# Patient Record
Sex: Male | Born: 1935
Health system: Southern US, Community
[De-identification: ages and names within clinical notes are randomized; demographics above are authoritative.]

## PROBLEM LIST (undated history)

## (undated) DIAGNOSIS — J439 Emphysema, unspecified: Secondary | ICD-10-CM

## (undated) DIAGNOSIS — N259 Disorder resulting from impaired renal tubular function, unspecified: Secondary | ICD-10-CM

## (undated) DIAGNOSIS — K649 Unspecified hemorrhoids: Secondary | ICD-10-CM

## (undated) DIAGNOSIS — J449 Chronic obstructive pulmonary disease, unspecified: Secondary | ICD-10-CM

## (undated) DIAGNOSIS — H3530 Unspecified macular degeneration: Secondary | ICD-10-CM

## (undated) DIAGNOSIS — I255 Ischemic cardiomyopathy: Secondary | ICD-10-CM

## (undated) DIAGNOSIS — I509 Heart failure, unspecified: Secondary | ICD-10-CM

## (undated) DIAGNOSIS — I252 Old myocardial infarction: Secondary | ICD-10-CM

## (undated) DIAGNOSIS — E785 Hyperlipidemia, unspecified: Secondary | ICD-10-CM

## (undated) DIAGNOSIS — Z95810 Presence of automatic (implantable) cardiac defibrillator: Secondary | ICD-10-CM

## (undated) DIAGNOSIS — K5730 Diverticulosis of large intestine without perforation or abscess without bleeding: Secondary | ICD-10-CM

## (undated) DIAGNOSIS — H269 Unspecified cataract: Secondary | ICD-10-CM

## (undated) DIAGNOSIS — J4489 Other specified chronic obstructive pulmonary disease: Secondary | ICD-10-CM

## (undated) DIAGNOSIS — I2510 Atherosclerotic heart disease of native coronary artery without angina pectoris: Secondary | ICD-10-CM

## (undated) DIAGNOSIS — N400 Enlarged prostate without lower urinary tract symptoms: Secondary | ICD-10-CM

## (undated) DIAGNOSIS — R339 Retention of urine, unspecified: Secondary | ICD-10-CM

## (undated) DIAGNOSIS — D649 Anemia, unspecified: Secondary | ICD-10-CM

## (undated) HISTORY — DX: Retention of urine, unspecified: R33.9

## (undated) HISTORY — PX: TONSILLECTOMY: SUR1361

## (undated) HISTORY — DX: Ischemic cardiomyopathy: I25.5

## (undated) HISTORY — DX: Benign prostatic hyperplasia without lower urinary tract symptoms: N40.0

## (undated) HISTORY — DX: Anemia, unspecified: D64.9

## (undated) HISTORY — DX: Diverticulosis of large intestine without perforation or abscess without bleeding: K57.30

## (undated) HISTORY — DX: Heart failure, unspecified: I50.9

## (undated) HISTORY — DX: Hyperlipidemia, unspecified: E78.5

## (undated) HISTORY — DX: Atherosclerotic heart disease of native coronary artery without angina pectoris: I25.10

## (undated) HISTORY — DX: Chronic obstructive pulmonary disease, unspecified: J44.9

## (undated) HISTORY — DX: Emphysema, unspecified: J43.9

## (undated) HISTORY — PX: OTHER SURGICAL HISTORY: SHX169

## (undated) HISTORY — PX: CATARACT EXTRACTION: SUR2

## (undated) HISTORY — DX: Other specified chronic obstructive pulmonary disease: J44.89

## (undated) HISTORY — DX: Disorder resulting from impaired renal tubular function, unspecified: N25.9

## (undated) HISTORY — DX: Presence of automatic (implantable) cardiac defibrillator: Z95.810

---

## 2000-05-07 ENCOUNTER — Encounter (INDEPENDENT_AMBULATORY_CARE_PROVIDER_SITE_OTHER): Payer: Self-pay | Admitting: Specialist

## 2000-05-07 ENCOUNTER — Other Ambulatory Visit: Admission: RE | Admit: 2000-05-07 | Discharge: 2000-05-07 | Payer: Self-pay | Admitting: Urology

## 2000-08-19 ENCOUNTER — Encounter: Admission: RE | Admit: 2000-08-19 | Discharge: 2000-08-19 | Payer: Self-pay | Admitting: Urology

## 2000-08-19 ENCOUNTER — Encounter: Payer: Self-pay | Admitting: Urology

## 2005-06-02 ENCOUNTER — Encounter: Admission: RE | Admit: 2005-06-02 | Discharge: 2005-06-02 | Payer: Self-pay | Admitting: Gastroenterology

## 2009-01-27 HISTORY — PX: PACEMAKER INSERTION: SHX728

## 2009-01-27 HISTORY — PX: CARDIAC DEFIBRILLATOR PLACEMENT: SHX171

## 2009-04-17 ENCOUNTER — Ambulatory Visit: Payer: Self-pay | Admitting: Internal Medicine

## 2009-04-17 ENCOUNTER — Inpatient Hospital Stay (HOSPITAL_COMMUNITY): Admission: EM | Admit: 2009-04-17 | Discharge: 2009-05-07 | Payer: Self-pay | Admitting: Emergency Medicine

## 2009-04-17 ENCOUNTER — Ambulatory Visit: Payer: Self-pay | Admitting: Cardiovascular Disease

## 2009-04-22 ENCOUNTER — Encounter: Payer: Self-pay | Admitting: Internal Medicine

## 2009-04-25 ENCOUNTER — Ambulatory Visit: Payer: Self-pay | Admitting: Physical Medicine & Rehabilitation

## 2009-05-07 ENCOUNTER — Inpatient Hospital Stay (HOSPITAL_COMMUNITY)
Admission: RE | Admit: 2009-05-07 | Discharge: 2009-05-14 | Payer: Self-pay | Admitting: Physical Medicine & Rehabilitation

## 2009-05-07 ENCOUNTER — Encounter: Payer: Self-pay | Admitting: Cardiovascular Disease

## 2009-05-10 ENCOUNTER — Encounter: Payer: Self-pay | Admitting: Cardiovascular Disease

## 2009-05-11 ENCOUNTER — Ambulatory Visit: Payer: Self-pay | Admitting: Physical Medicine & Rehabilitation

## 2009-05-22 ENCOUNTER — Telehealth: Payer: Self-pay | Admitting: Cardiovascular Disease

## 2009-06-06 DIAGNOSIS — I2510 Atherosclerotic heart disease of native coronary artery without angina pectoris: Secondary | ICD-10-CM | POA: Insufficient documentation

## 2009-06-06 DIAGNOSIS — K5730 Diverticulosis of large intestine without perforation or abscess without bleeding: Secondary | ICD-10-CM | POA: Insufficient documentation

## 2009-06-06 DIAGNOSIS — D649 Anemia, unspecified: Secondary | ICD-10-CM

## 2009-06-06 DIAGNOSIS — R339 Retention of urine, unspecified: Secondary | ICD-10-CM

## 2009-06-06 DIAGNOSIS — E785 Hyperlipidemia, unspecified: Secondary | ICD-10-CM | POA: Insufficient documentation

## 2009-06-06 DIAGNOSIS — Z87898 Personal history of other specified conditions: Secondary | ICD-10-CM

## 2009-06-06 DIAGNOSIS — J45909 Unspecified asthma, uncomplicated: Secondary | ICD-10-CM

## 2009-06-06 DIAGNOSIS — E119 Type 2 diabetes mellitus without complications: Secondary | ICD-10-CM | POA: Insufficient documentation

## 2009-06-06 DIAGNOSIS — N183 Chronic kidney disease, stage 3 (moderate): Secondary | ICD-10-CM

## 2009-06-07 ENCOUNTER — Ambulatory Visit: Payer: Self-pay | Admitting: Cardiovascular Disease

## 2009-06-07 DIAGNOSIS — I219 Acute myocardial infarction, unspecified: Secondary | ICD-10-CM | POA: Insufficient documentation

## 2009-06-22 ENCOUNTER — Ambulatory Visit: Payer: Self-pay | Admitting: Cardiovascular Disease

## 2009-06-22 ENCOUNTER — Ambulatory Visit (HOSPITAL_COMMUNITY): Admission: RE | Admit: 2009-06-22 | Discharge: 2009-06-22 | Payer: Self-pay | Admitting: Cardiovascular Disease

## 2009-06-22 ENCOUNTER — Encounter: Payer: Self-pay | Admitting: Cardiovascular Disease

## 2009-07-10 ENCOUNTER — Telehealth: Payer: Self-pay | Admitting: Cardiovascular Disease

## 2009-08-01 ENCOUNTER — Encounter: Payer: Self-pay | Admitting: Cardiovascular Disease

## 2009-08-07 ENCOUNTER — Ambulatory Visit: Payer: Self-pay | Admitting: Cardiovascular Disease

## 2009-08-07 ENCOUNTER — Telehealth: Payer: Self-pay | Admitting: Cardiovascular Disease

## 2009-08-07 DIAGNOSIS — M25569 Pain in unspecified knee: Secondary | ICD-10-CM

## 2009-08-08 ENCOUNTER — Encounter (INDEPENDENT_AMBULATORY_CARE_PROVIDER_SITE_OTHER): Payer: Self-pay | Admitting: *Deleted

## 2009-08-13 ENCOUNTER — Ambulatory Visit: Payer: Self-pay | Admitting: Cardiovascular Disease

## 2009-08-13 ENCOUNTER — Ambulatory Visit (HOSPITAL_COMMUNITY): Admission: RE | Admit: 2009-08-13 | Discharge: 2009-08-13 | Payer: Self-pay | Admitting: Cardiovascular Disease

## 2009-08-15 ENCOUNTER — Telehealth: Payer: Self-pay | Admitting: Cardiovascular Disease

## 2009-08-29 ENCOUNTER — Encounter: Payer: Self-pay | Admitting: Cardiovascular Disease

## 2009-10-10 ENCOUNTER — Ambulatory Visit: Payer: Self-pay | Admitting: Internal Medicine

## 2009-10-24 ENCOUNTER — Ambulatory Visit: Payer: Self-pay | Admitting: Cardiovascular Disease

## 2009-10-24 DIAGNOSIS — H902 Conductive hearing loss, unspecified: Secondary | ICD-10-CM

## 2009-10-31 ENCOUNTER — Telehealth: Payer: Self-pay | Admitting: Internal Medicine

## 2009-12-03 ENCOUNTER — Ambulatory Visit: Payer: Self-pay | Admitting: Internal Medicine

## 2009-12-03 LAB — CONVERTED CEMR LAB
GFR calc non Af Amer: 56.6 mL/min (ref >60)
Glucose, Bld: 108 mg/dL — ABNORMAL HIGH (ref 70–99)
Sodium: 144 meq/L (ref 135–145)

## 2009-12-06 DIAGNOSIS — J209 Acute bronchitis, unspecified: Secondary | ICD-10-CM

## 2009-12-13 ENCOUNTER — Encounter: Payer: Self-pay | Admitting: Cardiovascular Disease

## 2009-12-13 ENCOUNTER — Ambulatory Visit: Payer: Self-pay | Admitting: Internal Medicine

## 2009-12-13 ENCOUNTER — Ambulatory Visit (HOSPITAL_COMMUNITY)
Admission: RE | Admit: 2009-12-13 | Discharge: 2009-12-14 | Payer: Self-pay | Source: Home / Self Care | Admitting: Internal Medicine

## 2009-12-15 LAB — CONVERTED CEMR LAB
Eosinophils Relative: 3.8 % (ref 0.0–5.0)
Lymphocytes Relative: 14.5 % (ref 12.0–46.0)
Lymphs Abs: 0.9 10*3/uL (ref 0.7–4.0)
MCHC: 33.2 g/dL (ref 30.0–36.0)
Monocytes Absolute: 0.4 10*3/uL (ref 0.1–1.0)
Monocytes Relative: 6.5 % (ref 3.0–12.0)
Neutro Abs: 4.8 10*3/uL (ref 1.4–7.7)
Neutrophils Relative %: 74.9 % (ref 43.0–77.0)
Platelets: 112 10*3/uL — ABNORMAL LOW (ref 150.0–400.0)
RBC: 4.32 M/uL (ref 4.22–5.81)
RDW: 17.8 % — ABNORMAL HIGH (ref 11.5–14.6)

## 2009-12-17 ENCOUNTER — Encounter: Payer: Self-pay | Admitting: Internal Medicine

## 2010-01-10 ENCOUNTER — Encounter: Payer: Self-pay | Admitting: Internal Medicine

## 2010-01-10 ENCOUNTER — Ambulatory Visit: Payer: Self-pay | Admitting: Cardiovascular Disease

## 2010-01-10 ENCOUNTER — Ambulatory Visit: Payer: Self-pay

## 2010-02-15 NOTE — Discharge Summary (Signed)
NAME:  [name] [name], [name] [name]             ACCOUNT NO.:  [id number]  MEDICAL RECORD NO.:  [id number]           PATIENT TYPE:  LOCATION:                                 FACILITY:  PHYSICIAN:  Noralyn Pick. Eden Emms, MD, FACCDATE OF BIRTH:  1926-01-10  DATE OF ADMISSION:  04/18/2009 DATE OF DISCHARGE:  05/07/2009                              DISCHARGE SUMMARY   PRIMARY CARDIOLOGIST:  Theron Arista C. Eden Emms, MD, [facility]  PRIMARY CARE PHYSICIAN:  [name], MD  DISCHARGE DIAGNOSES: 1. Presumed ventricular tachycardia/ventricular fibrillation arrest.     a.     Bystander CPR, 2 shocks from emergency medical services,      normal sinus rhythm but unresponsive at [facility] emergency department,      status post cooling protocol. 2. Non-ST segment elevation myocardial infarction.     a.     Cardiac catheterization, April 30, 2009:  Moderate diffuse      left anterior descending stenosis, severe stenosis of the distal      left circumflex and ramus intermedius, severe stenosis of the      proximal right coronary artery, moderate diffuse stenosis in the      mid right coronary artery.     b.     Cardiac catheterization and successful percutaneous coronary      intervention with drug-eluting stents, May 02, 2009:  Successful      percutaneous intervention of the ramus intermedius, distal left      circumflex, and right coronary artery (proximal and mid segments      using drug-eluting stent platforms. 3. Anemia (stable on discharge). 4. Deconditioning.     a.     Discharge to inpatient rehab. 5. Klebsiella pneumonia. 6. Urinary tract infection (Enterococcus). 7. Thrombocytopenia (heparin-induced thrombocytopenia panel negative).  SECONDARY DIAGNOSES: 1. Hyperlipidemia. 2. Chronic obstructive pulmonary disease, (home O2 at night). 3. Benign prostatic hypertrophy. 4. Tobacco abuse.  ALLERGIES:  NKDA.  PROCEDURES: 1. EKG, May 10, 2009, at 2033:  NSR, 80 bpm, RBBB, T-wave inversion     in V1 with 3 mm of  ST depression in V2 and 2 mm of ST depression in     V3, no other significant ST changes, normal axis, PR 202, QRS 116,     QTc 465. 2. Chest x-ray, 2009-05-10:  Minimal basilar atelectasis.     Satisfactory endotracheal tube position.  Probable fractures,     lateral left fifth and sixth ribs. 3. The CT of the head, 2009-05-10:  Generalized atrophy.  No acute     intracranial abnormalities.  Left frontal scalp hematoma. 4. CT of the cervical spine, 05/10/2009:  Pronounced multilevel     degenerative disk and facet disease changes of cervical spine.  No     acute cervical spine abnormalities. 5. A 2-D echocardiogram, April 22, 2009:  Poor acoustic windows, LV     systolic function appears normal.  Cannot evaluate wall motion     likely grade 1 diastolic dysfunction. 6. Chest x-ray, April 25, 2009:  Interval extubation.  COPD/chronic     interstitial thickening without superimposed acute process. 7.  Cardiac catheterization, April 30, 2009:  Please see discharge     diagnoses section under non-ST-elevation myocardial infarction. 8. Cardiac catheterizations/percutaneous intervention, May 03, 2009:     Please see discharge diagnoses section under non-ST-elevation     myocardial infarction. 9. EKG, May 03, 2009:  NSR, nonspecific ST-T wave abnormality, normal     axis without evidence of hypertrophy, no significant Q-waves, PR     164, QRS 92, and QTc of 434.  HISTORY OF PRESENT ILLNESS:  The patient was in his usual state of health until 2 weeks prior to his presentation when he started noting right shoulder discomfort.  On date of presentation, he came home from work and was walking upstairs and complained to his family of significant worsening of his right shoulder pain with radiation to his left lower chest.  The patient then found down by his daughter who performed CPR for approximately 1 minute prior to the arrival of EMS. Rhythm is unknown, but the patient did receive 2  shocks as well as epinephrine and atropine with return of normal sinus rhythm.  However, the patient was not responsive and was brought to [facility] ED for further eval.  The patient was admitted and was initiated on the cooling protocol.  Sputum culture showed positive Klebsiella pneumonia and urine culture positive for Enterococcus.  The patient also noted to have thrombocytopenia, but HIT panel was negative.  He was stable for cardiac catheterization on April 30, 2009, showing multivessel disease.  It was not appropriate for CABG and therefore returned to the cath lab on May 02, 2009, for multivessel PCI with drug-eluting stents (please see discharge diagnoses section).  The patient also noted to have problems with hematuria and urinary retention.  Foley catheter was discontinued and voiding trial is being attempted.  Due to deconditioning with his prolonged hospital course after cardiac arrest, the patient was deemed appropriate for inpatient rehab.  Bed was available and he was deemed stable enough for transfer to inpatient rehab on May 07, 2009.  At that time, the patient was still having problems with safety awareness as well as memory, although these things were improving.  He continued have some chest discomfort most likely secondary to trauma from CPR.  DISCHARGE LABORATORIES:  None.  FOLLOWUP PLANS AND APPOINTMENTS:  Inpatient rehab.  DISCHARGE MEDICATIONS: 1. Tylenol 325/650 mg p.o. q.4 h. p.r.n. 2. Enteric-coated aspirin 325 mg p.o. daily. 3. Plavix 75 mg p.o. daily. 4. Advair 250/50 one puff b.i.d. 5. Mucinex LA 600 mg 2 tablets p.o. b.i.d. 6. Lopressor 25 mg 1/2 tablet p.o. b.i.d. 7. Crestor 20 mg p.o. nightly. 8. Senokot-S 2 tablets p.o. nightly. 9. Flomax 0.5 mg 2 tablets p.o. nightly. 10.Albuterol nebs t.i.d. 11.Fish oil 1 g 2 caps daily. 12.Spiriva 18 mcg p.o. daily. 13.Amoxicillin 250 p.o. t.i.d. 14.Protonix 40 p.o. daily. 15.Nicotine patch 21 mg apply  daily.  DURATION OF DISCHARGE ENCOUNTER:  Including physician time was 35 minutes.     Jarrett Ables, PAC   ______________________________ Noralyn Pick. Eden Emms, MD, [facility]    MS/MEDQ  D:  02/13/2010  T:  02/14/2010  Job:  [id number]  Electronically Signed by Jarrett Ables PAC on 02/15/2010 01:28:30 PM Electronically Signed by Charlton Haws MD [facility] on 02/15/2010 06:08:38 PM

## 2010-02-17 ENCOUNTER — Encounter: Payer: Self-pay | Admitting: Cardiovascular Disease

## 2010-02-26 NOTE — Progress Notes (Signed)
Summary: daughter wants to know where we are with cardiac rehab  Phone Note Call from Patient Call back at [phone]   Caller: Daughter/Stphanie Reason for Call: Talk to Nurse, Talk to Doctor Summary of Call: per pt daughter call they wanna know where we are with him starting cardiac rehab. I explained to her we send them the paperwork and cardiac rehab contacts the patient regarding when to start but she wantsa to know where we are in the process of the paperwork Initial call taken by: Omer Jack,  May 22, 2009 1:06 PM  Follow-up for Phone Call        Deb will and hor has taken care of this Follow-up by: Colon Branch, MD, [facility],  May 22, 2009 2:19 PM     Appended Document: daughter wants to know where we are with cardiac rehab spoke with karla at rehab, they have his paperwork and will contact pt about getting started. dtr aware .

## 2010-02-26 NOTE — Cardiovascular Report (Signed)
Summary: Pre Op Orders   Pre Op Orders   Imported By: Roderic Ovens 12/17/2009 16:46:19  _____________________________________________________________________  External Attachment:    Type:   Image     Comment:   External Document

## 2010-02-26 NOTE — Progress Notes (Signed)
Summary: dtr calling back re results  Phone Note Call from Patient   Caller: Daughter Reason for Call: Talk to Nurse Summary of Call: pt's dtr stephanie [phone] calling re results called to pt this am-pt is hard of hearing and dtr calling to confirm what he was told-pls call  Initial call taken by: Glynda Jaeger,  August 15, 2009 1:41 PM  Follow-up for Phone Call        BUSY X1 Scherrie Bateman, LPN  August 15, 2009 2:53 PM  DAUGHTER AWARE OF PLAN OF CARE FOR DAD  SEE CARDIAC MRI  ALSO RECEVED NUMBER FOR INS CO IN RE  TO PT NEEDING EFFIENT VERSUS ALT MED [phone] PER DAUGHTER. Follow-up by: Scherrie Bateman, LPN,  August 15, 2009 5:20 PM  Additional Follow-up for Phone Call Additional follow up Details #1::        See append from MRI Needs EP F/U Additional Follow-up by: Colon Branch, MD, [facility],  August 21, 2009 10:21 PM    Additional Follow-up for Phone Call Additional follow up Details #2::    APPT WITH DR Calton Golds FOR 9.14.11 AT 11:00 Follow-up by: Scherrie Bateman, LPN,  August 22, 2009 1:40 PM

## 2010-02-26 NOTE — Medication Information (Signed)
Summary: Request for Coverage of Non-Formulary Drug Form 2011  Request for Coverage of Non-Formulary Drug Form 2011   Imported By: Roderic Ovens 09/06/2009 13:12:45  _____________________________________________________________________  External Attachment:    Type:   Image     Comment:   External Document

## 2010-02-26 NOTE — Miscellaneous (Signed)
Summary: [facility] Cardiac Physician Order/Treatment Plan  [facility] Order/Treatment Plan   Imported By: Roderic Ovens 05/23/2009 14:08:36  _____________________________________________________________________  External Attachment:    Type:   Image     Comment:   External Document

## 2010-02-26 NOTE — Progress Notes (Signed)
Summary: Pt daughter checking on paper work  Phone Note Call from Patient Call back at [phone]   Caller: Daughter/Stephaine Summary of Call: Pt daughter calling to check on paper work for her leave why her father was in  hospital. Initial call taken by: Judie Grieve,  July 10, 2009 9:19 AM  Follow-up for Phone Call        faxed to [facility] and sent to patient Dennis Bast, RN, BSN  July 10, 2009 12:45 PM

## 2010-02-26 NOTE — Miscellaneous (Signed)
Summary: Device preload  Clinical Lists Changes  Observations: Added new observation of ICD INDICATN: ICM (12/17/2009 8:23) Added new observation of ICDLEADSTAT2: active (12/17/2009 8:23) Added new observation of ICDLEADSER2: [id number] (12/17/2009 8:23) Added new observation of ICDLEADMOD2: 7122  (12/17/2009 8:23) Added new observation of ICDLEADLOC2: RV  (12/17/2009 8:23) Added new observation of ICDLEADSTAT1: active  (12/17/2009 8:23) Added new observation of ICDLEADSER1: [id number]  (12/17/2009 8:23) Added new observation of ICDLEADMOD1: 2088TC  (12/17/2009 8:23) Added new observation of ICDLEADLOC1: RA  (12/17/2009 8:23) Added new observation of ICDLEADDOI2: 12/13/2009  (12/17/2009 8:23) Added new observation of ICDLEADDOI1: 12/13/2009  (12/17/2009 8:23) Added new observation of ICD IMP MD: Hillis Range, MD  (12/17/2009 8:23) Added new observation of ICD IMPL DTE: 12/13/2009  (12/17/2009 8:23) Added new observation of ICD SERL#: [id number]  (12/17/2009 8:23) Added new observation of ICD MODL#: [id number]  (12/17/2009 1:02) Added new observation of ICDMANUFACTR: [employer]  (12/17/2009 8:23) Added new observation of ICD MD: Hillis Range, MD  (12/17/2009 8:23)       ICD Specifications Following MD:  Hillis Range, MD     ICD Vendor:  [employer] Jude     ICD Model Number:  [id number]     ICD Serial Number:  [id number] ICD DOI:  12/13/2009     ICD Implanting MD:  Hillis Range, MD  Lead 1:    Location: RA     DOI: 12/13/2009     Model #: [id number]     Serial #: [id number]     Status: active Lead 2:    Location: RV     DOI: 12/13/2009     Model #: [id number]     Serial #: [id number]     Status: active  Indications::  ICM

## 2010-02-26 NOTE — Letter (Signed)
Summary: Appointment - Cardiac MRI  [name]     [place], [place]    Phone:   Fax:       August 08, 2009 MRN: [id number]   [name] [name] [address] [place], [place]  [address]   Dear Mr. Wheatley,   We have scheduled the above patient for an appointment for a Cardiac MRI on 08/13/2009  at 1:00 p.m.  Please refer to the below information for the location and instructions for this test:  Location:     [facility]       [address]       [place], [place]  [address] Instructions:    Wilmon Arms at [facility] Outpatient Registration 45 minutes prior to your appointment time.  This will ensure you are in the Radiology Department 30 minutes prior to your appointment.    There are no restrictions for this test you may eat and take medications as usual.  If you need to reschedule this appointment please call at the number listed above.  Sincerely,      Lorne Skeens  [facility] Scheduling Team

## 2010-02-26 NOTE — Miscellaneous (Signed)
Summary: [facility] Re[facility] Case Management Info Form  [facility] Re[facility] Case Management Info Form   Imported By: Roderic Ovens 06/04/2009 13:20:16  _____________________________________________________________________  External Attachment:    Type:   Image     Comment:   External Document

## 2010-02-26 NOTE — Assessment & Plan Note (Signed)
Summary: 2 month rov./sl   History of Present Illness: [name] [name] is seen today post hospital D/C He had sudden death and prolonged ventilation with cooling protocol.  He was not taken to the lab emergently as there was no acute ST elevation and only depression.  He was stabilize, extubated and subsequently cathed.  On 4/7 Dr Excell Seltzer placed stents to the circ, distal right and ramus.  EF was only mildly reduced.  However F/U echo in May showed EF 30-35%.   He was not thought to be in need of a defibrilator due to peri MI event and reasonable EF.  Prior to MI and arrest he was a heavy smoker but has not smoked since D/C . He completed an inpatient rehab stay and would like to do cardiac rehab at this time.  His activity is limited by left knee arthritis  His neurologic status is suprisingly good given the events.  He was shocked in the field with an  AED and had cooling protocol.  Since D/C he is gaining strength.  Has no SSCP< syncpoe, dyspnea or edema.  He has been complant with his meds.    I discussed the need to start ACE and change to Effient for optimal medical Rx.  We will do a cardiac MRI and have him see EPS to risk stratify him for AICD.    Current Problems (verified): 1)  Mi  (ICD-410.90) 2)  Cad  (ICD-414.00) 3)  Dyslipidemia  (ICD-272.4) 4)  Diverticular Disease  (ICD-562.10) 5)  Diabetes Mellitus, Type II  (ICD-250.00) 6)  Anemia  (ICD-285.9) 7)  Renal Insufficiency  (ICD-588.9) 8)  COPD  (ICD-496) 9)  Benign Prostatic Hypertrophy, Hx of  (ICD-V13.8) 10)  Urinary Retention  (ICD-788.20)  Current Medications (verified): 1)  Tylenol 325 Mg Tabs (Acetaminophen) .... As Needed 2)  Effient 10 Mg Tabs (Prasugrel Hcl) .... Take One Tablet By Mouth Daily 3)  Mucinex 600 Mg Xr12h-Tab (Guaifenesin) .... 2 Tabs By Mouth Two Times A Day 4)  Metoprolol Succinate 25 Mg Xr24h-Tab (Metoprolol Succinate) .... 1/2 Tab By Mouth Once Daily 5)  Crestor 20 Mg Tabs (Rosuvastatin Calcium) .... Take One  Tablet By Mouth Daily. 6)  Flomax 0.4 Mg Caps (Tamsulosin Hcl) .Marland Kitchen.. 1  Tab Po At Bedtime 7)  Albuterol Sulfate (2.5 Mg/28ml) 0.083% Nebu (Albuterol Sulfate) .... Once Daily 8)  Spiriva Handihaler 18 Mcg Caps (Tiotropium Bromide Monohydrate) .... Daily As Directed 9)  Fish Oil   Oil (Fish Oil) .... 2 Tabs By Mouth Once Daily 10)  Lorazepam  (Lorazepam) .Marland Kitchen.. 1 Tab By Mouth At Bedtime 11)  Losartan Potassium 25 Mg Tabs (Losartan Potassium) .... One Tablet By Mouth Once Daily  Allergies (verified): No Known Drug Allergies  Past History:  Past Medical History: Last updated: 06/06/2009 Current Problems:  CAD (ICD-414.00) DYSLIPIDEMIA (ICD-272.4) DIVERTICULAR DISEASE (ICD-562.10) DIABETES MELLITUS, TYPE II (ICD-250.00) ANEMIA (ICD-285.9) RENAL INSUFFICIENCY (ICD-588.9) COPD (ICD-496) BENIGN PROSTATIC HYPERTROPHY, HX OF (ICD-V13.8) URINARY RETENTION (ICD-788.20) Escherichia coli urinary tract infection, being treated.   Family History: Last updated: 06/06/2009 Positive for cancer.   Social History: Last updated: 06/06/2009  The patient is married, is retired Education officer, community.  He has a 2- pack-a-day history of tobacco.  Alcohol occasionally.   Review of Systems       Denies fever, malais, weight loss, blurry vision, decreased visual acuity, cough, sputum, SOB, hemoptysis, pleuritic pain, palpitaitons, heartburn, abdominal pain, melena, lower extremity edema, claudication, or rash.   Vital Signs:  Patient profile:   77  year old male Height:      72 inches Weight:      172 pounds BMI:     23.41 Pulse rate:   71 / minute Resp:     12 per minute BP sitting:   138 / 70  (left arm)  Vitals Entered By: Kem Parkinson (August 07, 2009 10:19 AM)  Physical Exam  General:  Affect appropriate Healthy:  appears stated age HEENT: normal Neck supple with no adenopathy JVP normal no bruits no thyromegaly Lungs clear with no wheezing and good diaphragmatic motion Heart:  S1/S2 no  murmur,rub, gallop or click PMI normal Abdomen: benighn, BS positve, no tenderness, no AAA no bruit.  No HSM or HJR Distal pulses intact with no bruits No edema Neuro non-focal Skin warm and dry    Impression & Recommendations:  Problem # 1:  KNEE PAIN (ICD-719.46)  Try Naproxen rather than Tylenol.  Refer to Charlann Boxer or Al[facility]  Orders: Misc. Referral (Misc. Ref)  Problem # 2:  MI (ICD-410.90) Ishcemic DCM with SD.  Add ACE and change to Effient.  MRI to quantitate EF and refer to EPS to consider AICD His updated medication list for this problem includes:    Effient 10 Mg Tabs (Prasugrel hcl) .Marland Kitchen... Take one tablet by mouth daily    Metoprolol Succinate 25 Mg Xr24h-tab (Metoprolol succinate) .Marland Kitchen... 1/2 tab by mouth once daily  Orders: EP Referral (Cardiology EP Ref ) Cardiac MRI (Cardiac MRI)  Problem # 3:  DYSLIPIDEMIA (ICD-272.4) F/U labs in 3 months His updated medication list for this problem includes:    Crestor 20 Mg Tabs (Rosuvastatin calcium) .Marland Kitchen... Take one tablet by mouth daily.  Problem # 4:  COPD (ICD-496) Stable with no active wheezing.  Not smoking since D/C and MI The following medications were removed from the medication list:    Advair Diskus 250-50 Mcg/dose Aepb (Fluticasone-salmeterol) .Marland Kitchen... 1 puff two times a day His updated medication list for this problem includes:    Albuterol Sulfate (2.5 Mg/75ml) 0.083% Nebu (Albuterol sulfate) ..... Once daily    Spiriva Handihaler 18 Mcg Caps (Tiotropium bromide monohydrate) .Marland Kitchen... Daily as directed  Patient Instructions: 1)  Your physician recommends that you schedule a follow-up appointment in: 3 MONTHS 2)  Your physician has recommended you make the following change in your medication: STOP PLAVIX  3)  START EFFIENT 10MG  ONCE DAILY 4)  START LOSARTAN 25MG  ONCE DAILY 5)  You have been referred to EP-TO DISCUSS ICD-SCHEDULE AFTER CARDIAC MRI 6)  Your physician has requested that you have a cardiac MRI.  Cardiac MRI  uses a computer to create images of your heart as it's beating, producing both still and moving pictures of your heart and major blood vessels. For further information please visit  [email or web address]  Please follow the instruction sheet given to you today for more information. Prescriptions: LOSARTAN POTASSIUM 25 MG TABS (LOSARTAN POTASSIUM) ONE TABLET by mouth once daily  #30 x 12   Entered by:   Deliah Goody, RN   Authorized by:   Colon Branch, MD, [facility]   Signed by:   Deliah Goody, RN on 08/07/2009   Method used:   Electronically to        [facility]* (retail)       [address]       [place], [place]  [id number]       Ph: [phone]       Fax: [phone]   RxID:   [id number]  EFFIENT 10 MG TABS (PRASUGREL HCL) Take one tablet by mouth daily  #30 x 12   Entered by:   Deliah Goody, RN   Authorized by:   Colon Branch, MD, [facility]   Signed by:   Deliah Goody, RN on 08/07/2009   Method used:   Electronically to        [facility]* (retail)       [address]       [place], [place]  [id number]       Ph: [phone]       Fax: [phone]   RxID:   [id number]    EKG Report  Procedure date:  08/07/2009  Findings:      NSR PAC PVC Rate 71 Lateral T wave inversions QT 428

## 2010-02-26 NOTE — Assessment & Plan Note (Signed)
Summary: eph/ gd   CC:  sob.  History of Present Illness: [name] [name] is seen today post hospital D/C He had sudden death and prolonged ventilation with cooling protocol.  He was not taken to the lab emergently as there was no acute ST elevation and only depression.  He was stabilize, extubated and subsequently cathed.  On 4/7 Dr Excell Seltzer placed stents to the circ, distal right and ramus.  EF was only mildly reduced.  He was not thought to be in need of a defibrilator due to peri MI event and reasonable EF.  Prior to MI and arrest he was a heavy smoker but has not smoked since D/C . He completed an inpatient rehab stay and would like to do cardiac rehab at this time.  His neurologic status is suprisingly good given the events.  He was shocked in the field with an  AED and had cooling protocol.  Since D/C he is gaining strength.  Has no SSCP< syncpoe, dyspnea or edema.  He has been complant with his meds.    Current Problems (verified): 1)  Mi  (ICD-410.90) 2)  Cad  (ICD-414.00) 3)  Dyslipidemia  (ICD-272.4) 4)  Diverticular Disease  (ICD-562.10) 5)  Diabetes Mellitus, Type II  (ICD-250.00) 6)  Anemia  (ICD-285.9) 7)  Renal Insufficiency  (ICD-588.9) 8)  COPD  (ICD-496) 9)  Benign Prostatic Hypertrophy, Hx of  (ICD-V13.8) 10)  Urinary Retention  (ICD-788.20)  Current Medications (verified): 1)  Tylenol 325 Mg Tabs (Acetaminophen) .... As Needed 2)  Plavix 75 Mg Tabs (Clopidogrel Bisulfate) .... Take One Tablet By Mouth Daily 3)  Advair Diskus 250-50 Mcg/dose Aepb (Fluticasone-Salmeterol) .Marland Kitchen.. 1 Puff Two Times A Day 4)  Mucinex 600 Mg Xr12h-Tab (Guaifenesin) .... 2 Tabs By Mouth Two Times A Day 5)  Metoprolol Succinate 25 Mg Xr24h-Tab (Metoprolol Succinate) .... 1/2 Tab By Mouth Once Daily 6)  Crestor 20 Mg Tabs (Rosuvastatin Calcium) .... Take One Tablet By Mouth Daily. 7)  Flomax 0.4 Mg Caps (Tamsulosin Hcl) .Marland Kitchen.. 1  Tab Po At Bedtime 8)  Albuterol Sulfate (2.5 Mg/82ml) 0.083% Nebu (Albuterol  Sulfate) .... Three Times A Day 9)  Spiriva Handihaler 18 Mcg Caps (Tiotropium Bromide Monohydrate) .... As Needed  Allergies (verified): No Known Drug Allergies  Past History:  Past Medical History: Last updated: 06/06/2009 Current Problems:  CAD (ICD-414.00) DYSLIPIDEMIA (ICD-272.4) DIVERTICULAR DISEASE (ICD-562.10) DIABETES MELLITUS, TYPE II (ICD-250.00) ANEMIA (ICD-285.9) RENAL INSUFFICIENCY (ICD-588.9) COPD (ICD-496) BENIGN PROSTATIC HYPERTROPHY, HX OF (ICD-V13.8) URINARY RETENTION (ICD-788.20) Escherichia coli urinary tract infection, being treated.   Family History: Last updated: 06/06/2009 Positive for cancer.   Social History: Last updated: 06/06/2009  The patient is married, is retired Education officer, community.  He has a 2- pack-a-day history of tobacco.  Alcohol occasionally.   Review of Systems       Denies fever, malais, weight loss, blurry vision, decreased visual acuity, cough, sputum, SOB, hemoptysis, pleuritic pain, palpitaitons, heartburn, abdominal pain, melena, lower extremity edema, claudication, or rash.   Vital Signs:  Patient profile:   77 year old male Height:      72 inches Weight:      168 pounds BMI:     22.87 Pulse rate:   68 / minute Resp:     12 per minute BP sitting:   144 / 68  (left arm)  Vitals Entered By: Kem Parkinson (Jun 07, 2009 4:01 PM)   Physical Exam  General:  Affect appropriate Healthy:  appears stated age HEENT: normal Neck supple with  no adenopathy JVP normal no bruits no thyromegaly Lungs clear with no wheezing and good diaphragmatic motion Heart:  S1/S2 no murmur,rub, gallop or click PMI normal Abdomen: benighn, BS positve, no tenderness, no AAA no bruit.  No HSM or HJR Distal pulses intact with no bruits No edema Neuro non-focal Skin warm and dry    Impression & Recommendations:  Problem # 1:  MI (ICD-410.90) S/O stent to circ, ramus and distal RCA.  Continue BB and Plavix.  Consdier eary myovue and switch to  Effient  His updated medication list for this problem includes:    Plavix 75 Mg Tabs (Clopidogrel bisulfate) .Marland Kitchen... Take one tablet by mouth daily    Metoprolol Succinate 25 Mg Xr24h-tab (Metoprolol succinate) .Marland Kitchen... 1/2 tab by mouth once daily  Orders: Echocardiogram (Echo) Cardiac Rehabilitation (Cardiac Rehab)  Problem # 2:  DYSLIPIDEMIA (ICD-272.4) Continue statin.  Check LFT's in 3 months His updated medication list for this problem includes:    Crestor 20 Mg Tabs (Rosuvastatin calcium) .Marland Kitchen... Take one tablet by mouth daily.  Problem # 3:  COPD (ICD-496) Stable.  Encouraged to abstain from smoking since D/C.  Continue inhalers His updated medication list for this problem includes:    Advair Diskus 250-50 Mcg/dose Aepb (Fluticasone-salmeterol) .Marland Kitchen... 1 puff two times a day    Albuterol Sulfate (2.5 Mg/29ml) 0.083% Nebu (Albuterol sulfate) .Marland Kitchen... Three times a day    Spiriva Handihaler 18 Mcg Caps (Tiotropium bromide monohydrate) .Marland Kitchen... As needed  Problem # 4:  ANEMIA (ICD-285.9) Post op continue iron.  Monitor stools.    Patient Instructions: 1)  Your physician recommends that you schedule a follow-up appointment in: 2 MONTHS WITH DR. Eden Emms 2)  Your physician recommends that you continue on your current medications as directed. Please refer to the Current Medication list given to you today. 3)  Your physician has requested that you have an echocardiogram.  Echocardiography is a painless test that uses sound waves to create images of your heart. It provides your doctor with information about the size and shape of your heart and how well your heart's chambers and valves are working.  This procedure takes approximately one hour. There are no restrictions for this procedure. 4)  Your physician recommends referral and attendance at a Cardiac Rehab Program. [facility] IN 3-4 WEEKS

## 2010-02-26 NOTE — Letter (Signed)
Summary: Implantable Device Instructions  Architectural technologist, Main Office  [address]   [place], [place] [address]   Phone: [phone]  Fax: [phone]      Implantable Device Instructions  You are scheduled for:   _____ Implantable Cardioverter Defibrillator  on 12/10/09 with Dr. Johney Frame.  1.  Please arrive at the Short Stay Center at [facility] at 8:30am on the day of your procedure.  2.  Do not eat or drink after midnight the night before your procedure.  3.  Complete lab work on 12/03/09.   You do not have to be fasting.  4.  You mat take all of your medications with a small sip of water    5.  Plan for an overnight stay.  Bring your insurance cards and a list of your medications.  6.  Wash your chest and neck with antibacterial soap (any brand) the evening before and the morning of your procedure.  Rinse well.  7.  Education material received:             ICD _____            *If you have ANY questions after you get home, please call the office [phone]. Anselm Pancoast  *Every attempt is made to prevent procedures from being rescheduled.  Due to the nauture of Electrophysiology, rescheduling can happen.  The physician is always aware and directs the staff when this occurs.

## 2010-02-26 NOTE — Letter (Signed)
Summary: Cardiac Rehabilitation Program  Cardiac Rehabilitation Program   Imported By: Marylou Mccoy 06/26/2009 14:27:47  _____________________________________________________________________  External Attachment:    Type:   Image     Comment:   External Document

## 2010-02-26 NOTE — Letter (Signed)
Summary: [facility] Medication Discharge Instructions  [facility] Medication Discharge Instructions   Imported By: Roderic Ovens 06/04/2009 13:19:22  _____________________________________________________________________  External Attachment:    Type:   Image     Comment:   External Document

## 2010-02-26 NOTE — Miscellaneous (Signed)
Summary: [facility] Cardiac Progress Note   [facility] Cardiac Progress Note   Imported By: Roderic Ovens 08/10/2009 12:59:27  _____________________________________________________________________  External Attachment:    Type:   Image     Comment:   External Document

## 2010-02-26 NOTE — Assessment & Plan Note (Signed)
Summary: per check out/sf   Referring Provider:  Charlton Haws Primary Provider:  Shanda Bumps   History of Present Illness: Laurin is seen today after his EPS consult.  He is a sudden death survivor with 3 stents.  EF by MRI is 31% with a large scar.  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  He still has good quality of life and family support.  He has returned to work.  Since hospital D/C he has stopped smoking.  Although it is unusual at his age, I think an AICD makes sense for him.  He is at high risk for a denovo arrythmic event or one secondary to recurrent ischemia given his 3 stents.  He will call Tresa Endo and schedule the procedure as soon as possible  He denies SSCP, SOB, or syncope.l  He has not been taking his losartan and I explained to him the importance of this medicine.  He wants to get new hearing aids but apparantly there is an issue with "fluid" behind the left ear.  I will refer him to Dr [name] or Jearld Fenton to expedite this.    Otherwise he is doing well.    Current Problems (verified): 1)  Knee Pain  (ICD-719.46) 2)  Mi  (ICD-410.90) 3)  Cad  (ICD-414.00) 4)  Dyslipidemia  (ICD-272.4) 5)  Diverticular Disease  (ICD-562.10) 6)  Diabetes Mellitus, Type II  (ICD-250.00) 7)  Anemia  (ICD-285.9) 8)  Renal Insufficiency  (ICD-588.9) 9)  COPD  (ICD-496) 10)  Benign Prostatic Hypertrophy, Hx of  (ICD-V13.8) 11)  Urinary Retention  (ICD-788.20)  Current Medications (verified): 1)  Tylenol 325 Mg Tabs (Acetaminophen) .... As Needed 2)  Plavix 75 Mg Tabs (Clopidogrel Bisulfate) .Marland Kitchen.. 1 Tab Once Daily 3)  Mucinex 600 Mg Xr12h-Tab (Guaifenesin) .... 2 Tabs By Mouth Two Times A Day 4)  Metoprolol Tartrate 25 Mg Tabs (Metoprolol Tartrate) .... 1/2 Tab Two Times A Day 5)  Crestor 20 Mg Tabs (Rosuvastatin Calcium) .... Take One Tablet By Mouth Daily. 6)  Flomax 0.4 Mg Caps (Tamsulosin Hcl) .Marland Kitchen.. 1  Tab Po At Bedtime 7)  Albuterol Sulfate (2.5 Mg/3ml)  0.083% Nebu (Albuterol Sulfate) .... Once Daily 8)  Spiriva Handihaler 18 Mcg Caps (Tiotropium Bromide Monohydrate) .... Daily As Directed 9)  Fish Oil 1000 Mg Caps (Omega-3 Fatty Acids) .... 2 Caps Once Daily 10)  Alprazolam 0.25 Mg Tabs (Alprazolam) .Marland Kitchen.. 1 Tab At Bedtime 11)  Losartan Potassium 25 Mg Tabs (Losartan Potassium) .... One Tablet By Mouth Once Daily  Allergies (verified): No Known Drug Allergies  Past History:  Past Medical History: Last updated: 10/10/2009 CAD (ICD-414.00) Ischemic CM NYHA Class II/III CHF DYSLIPIDEMIA (ICD-272.4) DIVERTICULAR DISEASE (ICD-562.10) DIABETES MELLITUS, TYPE II (diet controlled) ANEMIA (ICD-285.9) RENAL INSUFFICIENCY (ICD-588.9) COPD (ICD-496) ON o2 at night BENIGN PROSTATIC HYPERTROPHY, HX OF (ICD-V13.8) URINARY RETENTION (ICD-788.20)  Past Surgical History: Last updated: 10/10/2009 PCI 4/11  Family History: Last updated: 10/10/2009 Positive for cancer, CAD, CVA  Social History: Last updated: 10/10/2009 The patient is married, is Education officer, 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   He lives in [place].   He has a 2- pack-a-day history of tobacco but quit 4/11.  Alcohol occasionally.   Review of Systems       Denies fever, malais, weight loss, blurry vision, decreased visual acuity, cough, sputum, SOB, hemoptysis, pleuritic pain, palpitaitons, heartburn, abdominal pain, melena, lower extremity edema, claudication, or rash.   Vital Signs:  Patient profile:  77 year old male Height:      72 inches Weight:      179 pounds BMI:     24.36 Pulse rate:   65 / minute Resp:     12 per minute BP sitting:   136 / 73  (left arm)  Vitals Entered By: Kem Parkinson (October 24, 2009 4:12 PM)  Physical Exam  General:  Affect appropriate Healthy:  appears stated age HEENT: normal Neck supple with no adenopathy JVP normal no bruits no thyromegaly Lungs clear with no wheezing and good diaphragmatic motion Heart:  S1/S2  no murmur,rub, gallop or click PMI normal Abdomen: benighn, BS positve, no tenderness, no AAA no bruit.  No HSM or HJR Distal pulses intact with no bruits No edema Neuro non-focal Skin warm and dry The left TM has good light relex but does have a convex area of bulging inferioroly   Impression & Recommendations:  Problem # 1:  CAD (ICD-414.00) Stable no angina  Continue ASA and plavix with 3 stents His updated medication list for this problem includes:    Plavix 75 Mg Tabs (Clopidogrel bisulfate) .Marland Kitchen... 1 tab once daily    Metoprolol Tartrate 25 Mg Tabs (Metoprolol tartrate) .Marland Kitchen... 1/2 tab two times a day  Problem # 2:  MI (ICD-410.90) Ischemic DCM.  To resume losartan.  Long discussion with Dr Johney Frame patient and family.  Will proceed with AICD His updated medication list for this problem includes:    Plavix 75 Mg Tabs (Clopidogrel bisulfate) .Marland Kitchen... 1 tab once daily    Metoprolol Tartrate 25 Mg Tabs (Metoprolol tartrate) .Marland Kitchen... 1/2 tab two times a day  Problem # 3:  DYSLIPIDEMIA (ICD-272.4) Continue statin  LFT in 3 months His updated medication list for this problem includes:    Crestor 20 Mg Tabs (Rosuvastatin calcium) .Marland Kitchen... Take one tablet by mouth daily.  Problem # 4:  COPD (ICD-496) Improveing since he stopped smoking His updated medication list for this problem includes:    Albuterol Sulfate (2.5 Mg/38ml) 0.083% Nebu (Albuterol sulfate) ..... Once daily    Spiriva Handihaler 18 Mcg Caps (Tiotropium bromide monohydrate) .Marland Kitchen... 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  Hearing aids would improve his quality of life quite a bit as he is "missing a lot".  Patient Instructions: 1)  Your physician recommends that you schedule a follow-up appointment in: 3 MONTHS WITH DR Laurence Slate 2)  PT TO CALL TO SCHEDULE DEFIB WITH DR Johney Frame 3)  Your physician recommends that you continue on your current medications as directed. Please refer to the Current Medication list given to you today. 4)  MAKE SURE YOU TAKE LOSARTAN 1 once daily 5)  DAVID SHOEMAKER [phone] 6)  JOHN BEYERS SAME NUMBER AS ABOVE

## 2010-02-26 NOTE — Progress Notes (Signed)
Summary: Calling to schedule  procedure  Phone Note Call from Patient Call back at Home Phone [phone] Call back at [phone]   Caller: Daughter/Stephaine Summary of Call: Pt daughter calling to schedule a procedure for father Initial call taken by: Judie Grieve,  October 31, 2009 11:17 AM  Follow-up for Phone Call        labs 4:30 on 11/7 procedure 11/14 9:30 /11:30 Dennis Bast, RN, BSN  October 31, 2009 2:26 PM

## 2010-02-26 NOTE — Assessment & Plan Note (Signed)
Summary: aicd /mt   Visit Type:  new pt visit Referring Provider:  Charlton Haws Primary Provider:  Shanda Bumps  CC:  no cardiac complaints today.  History of Present Illness: Mr [name] [name] is a pleasant 77 yo WM with recent successful rescucitationh from VF arrest due to acute ischemia 4/11.  He was found to have multivessel CAD and underwent PCI at that time.  He was placed on an optimal medical regimen  and after 3 months had an MRI obtained which documents his EF to be 31%.  He has done well since his arrest.  His family feels that he has made a full recovery except that he is "more tired than he used to be".  He mowes his own lawn and remains very active. He denies palpitations, chest pain, shortness of breath, orthopnea, PND, lower extremity edema, dizziness, presyncope, syncope, or neurologic sequela. The patient is tolerating medications without difficulties and is otherwise without complaint today.   Current Medications (verified): 1)  Tylenol 325 Mg Tabs (Acetaminophen) .... As Needed 2)  Plavix 75 Mg Tabs (Clopidogrel Bisulfate) .Marland Kitchen.. 1 Tab Once Daily 3)  Mucinex 600 Mg Xr12h-Tab (Guaifenesin) .... 2 Tabs By Mouth Two Times A Day 4)  Metoprolol Tartrate 25 Mg Tabs (Metoprolol Tartrate) .... 1/2 Tab Two Times A Day 5)  Crestor 20 Mg Tabs (Rosuvastatin Calcium) .... Take One Tablet By Mouth Daily. 6)  Flomax 0.4 Mg Caps (Tamsulosin Hcl) .Marland Kitchen.. 1  Tab Po At Bedtime 7)  Albuterol Sulfate (2.5 Mg/34ml) 0.083% Nebu (Albuterol Sulfate) .... Once Daily 8)  Spiriva Handihaler 18 Mcg Caps (Tiotropium Bromide Monohydrate) .... Daily As Directed 9)  Fish Oil 1000 Mg Caps (Omega-3 Fatty Acids) .... 2 Caps Once Daily 10)  Alprazolam 0.25 Mg Tabs (Alprazolam) .Marland Kitchen.. 1 Tab At Bedtime 11)  Losartan Potassium 25 Mg Tabs (Losartan Potassium) .... 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   He lives in [place].   He has a 2- pack-a-day history of tobacco but quit 4/11.  Alcohol occasionally.   Review of Systems       All systems are reviewed and negative except as listed in the HPI.   Vital Signs:  Patient profile:   77 year old male Height:      72 inches Weight:      178 pounds BMI:     24.23 Pulse rate:   61 / minute Pulse rhythm:   irregular BP sitting:   128 / 72  (left arm) Cuff size:   regular  Vitals Entered By: Danielle Rankin, CMA (October 10, 2009 10:59 AM)  Physical Exam  General:  well appearing elderly male NAD Head:  normocephalic and atraumatic Eyes:  PERRLA/EOM intact; conjunctiva and lids normal. Mouth:  Teeth, gums and palate normal. Oral mucosa normal. Neck:  Neck supple, no JVD. No masses, thyromegaly or abnormal cervical nodes. Lungs:  diffuse expiratory wheezes (mild). Heart:  RRR, no m/r/g Abdomen:  Bowel sounds positive; abdomen soft and non-tender without masses, organomegaly, or hernias  noted. No hepatosplenomegaly. Msk:  Back normal, normal gait. Muscle strength and tone normal. Pulses:  pulses normal in all 4 extremities Extremities:  No clubbing or cyanosis. Neurologic:  Alert and oriented x 3. Skin:  Intact without lesions or rashes. Cervical Nodes:  no significant adenopathy Psych:  Normal affect.   Impression & Recommendations:  Problem # 1:  MI (ICD-410.90) The patient is s/p VF arrest with successful rescucitation 4/11 due to AMI/ ischemia.  He was revascularized at  that time, but continues to have an ischemic CM (EF 31%) with NYHA Class II/III chronic systolic dysfunction despite optimal medical therapy.  Given the above, I have strongly recommended ICD implantation for seconday prevention of sudden cardiac arrest.   Risks, benefits, alternatives to ICD implantation were discussed in detail with the patient and his daughter today.  He understands that the risks include but are not limited to bleeding, infection, pneumothorax, perforation, tamponade, vascular damage, renal failure, MI, stroke, death, inappropriate shocks, and lead dislodgement.  He would like to further discuss this with Dr Eden Emms before proceeding.  I have provided a handout on ICD implantation and also given the patient my card. He will contact my office once he wishes to proceed with ICD implantation.  Problem # 2:  DYSLIPIDEMIA (ICD-272.4) stable His updated medication list for this problem includes:    Crestor 20 Mg Tabs (Rosuvastatin calcium) .Marland Kitchen... Take one tablet by mouth daily.  Problem # 3:  COPD (ICD-496) stable His updated medication list for this problem includes:    Albuterol Sulfate (2.5 Mg/26ml) 0.083% Nebu (Albuterol sulfate) ..... Once daily    Spiriva Handihaler 18 Mcg Caps (Tiotropium bromide monohydrate) .Marland Kitchen... Daily as directed

## 2010-02-26 NOTE — Assessment & Plan Note (Signed)
Summary: cbc    Patient Instructions: 1)  Your physician has recommended you make the following change in your medication: pt to start amoxicillin 500mg  two times a day for 7days  2)  Change ICD iplant to 12/13/09 at 2:30pm 3)   Pt to arrive at Short Stay at 12:30.  Clear liquid breakfast  Nothing to eat or drink after 7:00am morning of procedure   Referring Provider:  Charlton Haws Primary Provider:  Shanda Bumps   History of Present Illness: Mr Diskin presents today for evaluation prior to his upcoming ICD implantation.  He reports cough productive of yellow sputum with SOB and wheezing for several days.  He denies fevers, chills, sore throat, CP, palpitations, orthopnea, PND, or edema.  He is otherwise without complaint today.   Past History:  Past Medical History: Reviewed history from 10/10/2009 and no changes required. CAD (ICD-414.00) Ischemic CM NYHA Class II/III CHF DYSLIPIDEMIA (ICD-272.4) DIVERTICULAR DISEASE (ICD-562.10) DIABETES MELLITUS, TYPE II (diet controlled) ANEMIA (ICD-285.9) RENAL INSUFFICIENCY (ICD-588.9) COPD (ICD-496) ON o2 at night BENIGN PROSTATIC HYPERTROPHY, HX OF (ICD-V13.8) URINARY RETENTION (ICD-788.20)  Past Surgical History: Reviewed history from 10/10/2009 and no changes required. PCI 4/11   Physical Exam  General:  well appearing, NAD Head:  normocephalic and atraumatic Eyes:  PERRLA/EOM intact; conjunctiva and lids normal. Mouth:  Teeth, gums and palate normal. Oral mucosa normal. Neck:  supple, JVP 8cm Lungs:  coarse BS with few expiratory wheezes, nl WOB Heart:  RRR, no m/r/g Abdomen:  Bowel sounds positive; abdomen soft and non-tender without masses, organomegaly, or hernias noted. No hepatosplenomegaly. Msk:  Back normal, normal gait. Muscle strength and tone normal. Pulses:  pulses normal in all 4 extremities Extremities:  No clubbing or cyanosis. Neurologic:  Alert and oriented x 3.   Impression &  Recommendations:  Problem # 1:  ACUTE BRONCHITIS (ICD-466.0) the patient presents today with clinical bronchitis Given h/o CAD and lung disease, he is at increased risk with bronchitis.  He nevertheless appears stable today. I will postpone his ICD implantation until his sypmtoms resolve (probably late next week). We will check CBC today and treat empirically with abx.  He will contact our office or go to the ER if symptoms worsen.  Other Orders: TLB-CBC Platelet - w/Differential ([id number])

## 2010-02-26 NOTE — Progress Notes (Signed)
Summary: pt has medication question  Phone Note Call from Patient Call back at [phone]   Caller: Daughter/[name] Reason for Call: Talk to Nurse, Talk to Doctor Summary of Call: the medication that was given today to replace plavix is not covered by insurance. the medication for knee pain they need a Rx for that. Initial call taken by: Omer Jack,  August 07, 2009 3:47 PM  Follow-up for Phone Call        spoke with dtr, she is going to get the number for prior auth for the effient and let me know Deliah Goody, RN  August 07, 2009 5:06 PM

## 2010-02-28 NOTE — Assessment & Plan Note (Signed)
Summary: 3 month rov.sl   Visit Type:  Follow-up Referring Provider:  Charlton Haws Primary Provider:  Shanda Bumps  CC:  No complaints.  History of Present Illness: [name] [name] is seen today after his  AICD implant.  He is a sudden death survivor with 3 stents.  EF by MRI is 31% with a large scar.  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  He still has good quality of life and family support.  He has returned to work.  He has stopped smoking since his arrest  .  He denies SSCP, SOB, or syncope.l  Device has not fired and is working well on interogation.  Wound healed well.  Compliant with meds and no SSCP.  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 As Needed 2)  Plavix 75 Mg Tabs (Clopidogrel Bisulfate) .Marland Kitchen.. 1 Tab Once Daily 3)  Mucinex 600 Mg Xr12h-Tab (Guaifenesin) .... 2 Tabs By Mouth Two Times A Day 4)  Metoprolol Tartrate 25 Mg Tabs (Metoprolol Tartrate) .... 1/2 Tab Two Times A Day 5)  Crestor 20 Mg Tabs (Rosuvastatin Calcium) .... Take One Tablet By Mouth Daily. 6)  Flomax 0.4 Mg Caps (Tamsulosin Hcl) .Marland Kitchen.. 1  Tab Po At Bedtime 7)  Albuterol Sulfate (2.5 Mg/86ml) 0.083% Nebu (Albuterol Sulfate) .... Once Daily 8)  Spiriva Handihaler 18 Mcg Caps (Tiotropium Bromide Monohydrate) .... Daily As Directed 9)  Fish Oil 1000 Mg Caps (Omega-3 Fatty Acids) .... 2 Caps Once Daily 10)  Alprazolam 0.25 Mg Tabs (Alprazolam) .Marland Kitchen.. 1 Tab  At Bedtime 11)  Losartan Potassium 25 Mg Tabs (Losartan Potassium) .... One Tablet By Mouth Once Daily 12)  Amoxicillin 500 Mg Tabs (Amoxicillin) .... One By Mouth Two Times A Day For 7 Days  Allergies (verified): No Known Drug Allergies  Past History:  Past Medical History: Last updated: 10/10/2009 CAD (ICD-414.00) Ischemic CM NYHA Class II/III CHF DYSLIPIDEMIA (ICD-272.4) DIVERTICULAR DISEASE (ICD-562.10) DIABETES MELLITUS, TYPE II (diet controlled) ANEMIA (ICD-285.9) RENAL INSUFFICIENCY (ICD-588.9) COPD (ICD-496) ON o2 at night BENIGN PROSTATIC HYPERTROPHY, HX OF (ICD-V13.8) URINARY RETENTION (ICD-788.20)  Past Surgical History: Last updated: 10/10/2009 PCI 4/11  Family History: Last updated: 10/10/2009 Positive for cancer, CAD, CVA  Social History: Last updated: 10/10/2009 The patient is married, is Education officer, 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   He lives in [place].   He has a 2- pack-a-day history of tobacco but quit 4/11.  Alcohol occasionally.   Review of Systems       Denies fever, malais, weight loss, blurry vision, decreased visual acuity, cough, sputum, SOB, hemoptysis, pleuritic pain, palpitaitons, heartburn, abdominal pain, melena, lower extremity edema, claudication, or rash.   Vital Signs:  Patient profile:   77 year old male Height:      72 inches Weight:      183 pounds BMI:     24.91 Pulse rate:   70 / minute Pulse rhythm:   regular Resp:  18 per minute BP sitting:   124 / 60  (left arm) Cuff size:   large  Vitals Entered By: Vikki Ports (January 10, 2010 3:49 PM)  Physical Exam  General:  Affect appropriate Healthy:  appears stated age HEENT: normal Neck supple with no adenopathy JVP normal no bruits no thyromegaly Lungs clear with no wheezing and good diaphragmatic motion Heart:  S1/S2 no murmur,rub, gallop or click PMI normal Abdomen: benighn, BS positve, no tenderness, no AAA no bruit.  No HSM or HJR Distal pulses  intact with no bruits No edema Neuro non-focal Skin warm and dry AICD under left clavicle healed well    ICD Specifications Following MD:  Hillis Range, MD     ICD Vendor:  St Jude     ICD Model Number:  [phone]     ICD Serial Number:  [id number] ICD DOI:  12/13/2009     ICD Implanting MD:  Hillis Range, MD  Lead 1:    Location: [facility]     DOI: 12/13/2009     Model #: [id number]     Serial #: [id number]     Status: active Lead 2:    Location: RV     DOI: 12/13/2009     Model #: [id number]     Serial #: [id number]     Status: active  Indications::  ICM   Impression & Recommendations:  Problem # 1:  CAD (ICD-414.00) Stable S/P stenting  Continue Plavix and BB.  No angina His updated medication list for this problem includes:    Plavix 75 Mg Tabs (Clopidogrel bisulfate) .Marland Kitchen... 1 tab once daily    Metoprolol Tartrate 25 Mg Tabs (Metoprolol tartrate) .Marland Kitchen... 1/2 tab two times a day  Problem # 2:  DYSLIPIDEMIA (ICD-272.4) Continue statin Labs in 6 months His updated medication list for this problem includes:    Crestor 20 Mg Tabs (Rosuvastatin calcium) .Marland Kitchen... Take one tablet by mouth daily.  Problem # 3:  MI (ICD-410.90) Inschemic DCM with EF 31%  S/P AICD.  F/U EPS 3 months.  Improved.  Funcitonal class 2 and euvolemic His updated medication list for this problem includes:    Plavix 75 Mg Tabs (Clopidogrel bisulfate) .Marland Kitchen... 1 tab once daily    Metoprolol Tartrate 25 Mg Tabs (Metoprolol tartrate) .Marland Kitchen... 1/2 tab two times a day  Patient Instructions: 1)  Your physician wants you to follow-up in: 6 MONTHS  You will receive a reminder letter in the mail two months in advance. If you don't receive a letter, please call our office to schedule the follow-up appointment.

## 2010-02-28 NOTE — Procedures (Signed)
Summary: pcp/   Current Medications (verified): 1)  Tylenol 325 Mg Tabs (Acetaminophen) .... As Needed 2)  Plavix 75 Mg Tabs (Clopidogrel Bisulfate) .Marland Kitchen.. 1 Tab Once Daily 3)  Mucinex 600 Mg Xr12h-Tab (Guaifenesin) .... 2 Tabs By Mouth Two Times A Day 4)  Metoprolol Tartrate 25 Mg Tabs (Metoprolol Tartrate) .... 1/2 Tab Two Times A Day 5)  Crestor 20 Mg Tabs (Rosuvastatin Calcium) .... Take One Tablet By Mouth Daily. 6)  Flomax 0.4 Mg Caps (Tamsulosin Hcl) .Marland Kitchen.. 1  Tab Po At Bedtime 7)  Albuterol Sulfate (2.5 Mg/57ml) 0.083% Nebu (Albuterol Sulfate) .... Once Daily 8)  Spiriva Handihaler 18 Mcg Caps (Tiotropium Bromide Monohydrate) .... Daily As Directed 9)  Fish Oil 1000 Mg Caps (Omega-3 Fatty Acids) .... 2 Caps Once Daily 10)  Alprazolam 0.25 Mg Tabs (Alprazolam) .Marland Kitchen.. 1 Tab At Bedtime 11)  Losartan Potassium 25 Mg Tabs (Losartan Potassium) .... One Tablet By Mouth Once Daily  Allergies (verified): No Known Drug Allergies   ICD Specifications Following MD:  Hillis Range, MD     ICD Vendor:  St Jude     ICD Model Number:  [phone]     ICD Serial Number:  [id number] ICD DOI:  12/13/2009     ICD Implanting MD:  Hillis Range, MD  Lead 1:    Location: [facility]     DOI: 12/13/2009     Model #: [id number]     Serial #: [id number]     Status: active Lead 2:    Location: RV     DOI: 12/13/2009     Model #: [id number]     Serial #: [id number]     Status: active  Indications::  ICM   ICD Follow Up Remote Check?  No Charge Time:  7.9 seconds     Battery Est. Longevity:  6 years Underlying rhythm:  SR ICD Dependent:  No       ICD Device Measurements Atrium:  Amplitude: 2.9 mV, Impedance: 390 ohms, Threshold: 1.0 V at 0.5 msec Right Ventricle:  Amplitude: 11.9 mV, Impedance: 390 ohms, Threshold: 1.0 V at 0.5 msec Shock Impedance: 53 ohms   Episodes MS Episodes:  0     Percent Mode Switch:  0     Coumadin:  No Shock:  0     ATP:  0     Nonsustained:  0     Atrial Pacing:  <1%     Ventricular Pacing:  <1%  Brady  Parameters Mode DDI     Lower Rate Limit:  40     PAV 350      Tachy Zones VF:  222     VT:  179     Next Cardiology Appt Due:  04/04/2010 Tech Comments:  Steri strips removed by the patient , wound well healed.   No parameter changes.  Device function normal.  ROV 3/8 with Dr. Johney Frame. Altha Harm, LPN  January 10, 2010 3:52 PM

## 2010-02-28 NOTE — Cardiovascular Report (Signed)
Summary: Office Visit   Office Visit   Imported By: Roderic Ovens 02/05/2010 13:59:00  _____________________________________________________________________  External Attachment:    Type:   Image     Comment:   External Document

## 2010-03-20 NOTE — Letter (Signed)
Summary: [facility] Discharge Summary  [facility] Discharge Summary   Imported By: Earl Many 03/06/2010 18:42:03  _____________________________________________________________________  External Attachment:    Type:   Image     Comment:   External Document

## 2010-04-03 ENCOUNTER — Encounter: Payer: Self-pay | Admitting: Internal Medicine

## 2010-04-04 ENCOUNTER — Encounter: Payer: Self-pay | Admitting: Internal Medicine

## 2010-04-04 ENCOUNTER — Encounter (INDEPENDENT_AMBULATORY_CARE_PROVIDER_SITE_OTHER): Payer: Medicare Other | Admitting: Internal Medicine

## 2010-04-04 DIAGNOSIS — I5022 Chronic systolic (congestive) heart failure: Secondary | ICD-10-CM

## 2010-04-04 DIAGNOSIS — I469 Cardiac arrest, cause unspecified: Secondary | ICD-10-CM | POA: Insufficient documentation

## 2010-04-04 DIAGNOSIS — I472 Ventricular tachycardia: Secondary | ICD-10-CM

## 2010-04-09 LAB — SURGICAL PCR SCREEN
MRSA, PCR: NEGATIVE
Staphylococcus aureus: POSITIVE — AB

## 2010-04-09 LAB — CBC
MCH: 21.1 pg — ABNORMAL LOW (ref 26.0–34.0)
MCHC: 32 g/dL (ref 30.0–36.0)
MCV: 66 fL — ABNORMAL LOW (ref 78.0–100.0)
Platelets: 114 10*3/uL — ABNORMAL LOW (ref 150–400)
RDW: 17.1 % — ABNORMAL HIGH (ref 11.5–15.5)

## 2010-04-09 LAB — GLUCOSE, CAPILLARY
Glucose-Capillary: 79 mg/dL (ref 70–99)
Glucose-Capillary: 83 mg/dL (ref 70–99)

## 2010-04-09 NOTE — Assessment & Plan Note (Signed)
Summary: DEFIB CK/SJM/AMBER/KL   Visit Type:  Follow-up Referring Provider:  Charlton Haws Primary Provider:  Shanda Bumps   History of Present Illness: The patient presents today for routine electrophysiology followup. He reports doing very well since last being seen in our clinic. The patient denies symptoms of palpitations, chest pain, shortness of breath, orthopnea, PND, lower extremity edema, dizziness, presyncope, syncope, or neurologic sequela. The patient is tolerating medications without difficulties and is otherwise without complaint today.   Current Medications (verified): 1)  Plavix 75 Mg Tabs (Clopidogrel Bisulfate) .Marland Kitchen.. 1 Tab Once Daily 2)  Mucinex 600 Mg Xr12h-Tab (Guaifenesin) .... 2 Tabs By Mouth Two Times A Day 3)  Metoprolol Tartrate 25 Mg Tabs (Metoprolol Tartrate) .... 1/2 Tab Two Times A Day 4)  Crestor 20 Mg Tabs (Rosuvastatin Calcium) .... Take One Tablet By Mouth Daily. 5)  Flomax 0.4 Mg Caps (Tamsulosin Hcl) .Marland Kitchen.. 1  Tab Po At Bedtime 6)  Albuterol Sulfate (2.5 Mg/3ml) 0.083% Nebu (Albuterol Sulfate) .... Once Daily 7)  Spiriva Handihaler 18 Mcg Caps (Tiotropium Bromide Monohydrate) .... Daily As Directed 8)  Fish Oil 1000 Mg Caps (Omega-3 Fatty Acids) .... 2 Caps Once Daily 9)  Alprazolam 0.25 Mg Tabs (Alprazolam) .Marland Kitchen.. 1 Tab At Bedtime 10)  Losartan Potassium 25 Mg Tabs (Losartan Potassium) .... One Tablet By Mouth Once Daily 11)  Aspirin 81 Mg  Tabs (Aspirin) .Marland Kitchen.. 1 By Mouth Daily 12)  Ferro-Bob 325 (65 Fe) Mg Tabs (Ferrous Sulfate) .Marland Kitchen.. 1 By Mouth Two Times A Day  Allergies (verified): No Known Drug Allergies  Past History:  Past Medical History: Reviewed history from 10/10/2009 and no changes required. CAD (ICD-414.00) Ischemic CM NYHA Class II/III CHF DYSLIPIDEMIA (ICD-272.4) DIVERTICULAR DISEASE (ICD-562.10) DIABETES MELLITUS, TYPE II (diet controlled) ANEMIA (ICD-285.9) RENAL INSUFFICIENCY (ICD-588.9) COPD (ICD-496) ON o2 at night BENIGN  PROSTATIC HYPERTROPHY, HX OF (ICD-V13.8) URINARY RETENTION (ICD-788.20)  Past Surgical History: PCI 4/11 ICD implant by JA (SJM)  Social History: Reviewed history from 10/10/2009 and no changes required. 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   He lives in [place].   He has a 2- pack-a-day history of tobacco but quit 4/11.  Alcohol occasionally.   Vital Signs:  Patient profile:   77 year old male Height:      72 inches Weight:      187 pounds BMI:     25.45 Pulse rate:   68 / minute Resp:     16 per minute BP sitting:   122 / 60  (right arm)  Vitals Entered By: Marrion Coy, CNA (April 04, 2010 9:37 AM)  Physical Exam  General:  Affect appropriate Healthy:  appears stated age HEENT: normal Neck supple with no adenopathy JVP normal no bruits no thyromegaly Lungs clear with no wheezing and good diaphragmatic motion Heart:  S1/S2 no murmur,rub, gallop or click PMI normal Abdomen: benighn, BS positve, no tenderness, no AAA no bruit.  No HSM or HJR Distal pulses intact with no bruits No edema Neuro non-focal Skin warm and dry AICD under left clavicle healed well    ICD Specifications Following MD:  Hillis Range, MD     ICD Vendor:  St Jude     ICD Model Number:  [phone]     ICD Serial Number:  [id number] ICD DOI:  12/13/2009     ICD Implanting MD:  Hillis Range, MD  Lead 1:    Location: RA     DOI:  12/13/2009     Model #: [id number]     Serial #: [id number]     Status: active Lead 2:    Location: RV     DOI: 12/13/2009     Model #: [id number]     Serial #: [id number]     Status: active  Indications::  ICM   ICD Follow Up ICD Dependent:  No      Episodes Coumadin:  No  Brady Parameters Mode DDI     Lower Rate Limit:  40     PAV 350      Tachy Zones VF:  222     VT:  179     MD Comments:  see scanned report in paceart  Impression & Recommendations:  Problem # 1:  CARDIAC ARREST (ICD-427.5) prior vf arrest s/p ICD doing well see  scanned report in PACE[facility]  merlin checks every 3 months, return to device clinic in 12 months  Problem # 2:  CAD (ICD-414.00) ischemic CM with stable chronic systolic dysfunction no changes today follow-up with Dr Eden Emms  Patient Instructions: 1)  Your physician wants you to follow-up in:12 months with Dr.Sofia Vanmeter   You will receive a reminder letter in the mail two months in advance. If you don't receive a letter, please call our office to schedule the follow-up appointment.

## 2010-04-13 LAB — BUN: BUN: 30 mg/dL — ABNORMAL HIGH (ref 6–23)

## 2010-04-13 LAB — CREATININE, SERUM
Creatinine, Ser: 1.2 mg/dL (ref 0.4–1.5)
GFR calc Af Amer: >60 mL/min (ref >60)
GFR calc non Af Amer: 58 mL/min — ABNORMAL LOW (ref >60)

## 2010-04-16 LAB — URINE CULTURE

## 2010-04-16 LAB — BASIC METABOLIC PANEL
BUN: 21 mg/dL (ref 6–23)
Chloride: 105 mEq/L (ref 96–112)
Potassium: 3.6 mEq/L (ref 3.5–5.1)

## 2010-04-16 LAB — CBC
HCT: 29.2 % — ABNORMAL LOW (ref 39.0–52.0)
MCV: 71.7 fL — ABNORMAL LOW (ref 78.0–100.0)
Platelets: 100 10*3/uL — ABNORMAL LOW (ref 150–400)
RBC: 4.08 MIL/uL — ABNORMAL LOW (ref 4.22–5.81)
WBC: 5.5 10*3/uL (ref 4.0–10.5)

## 2010-04-16 LAB — URINALYSIS, ROUTINE W REFLEX MICROSCOPIC
Leukocytes, UA: NEGATIVE
Nitrite: NEGATIVE
Protein, ur: NEGATIVE mg/dL
Urobilinogen, UA: 0.2 mg/dL (ref 0.0–1.0)

## 2010-04-16 LAB — GLUCOSE, CAPILLARY
Glucose-Capillary: 109 mg/dL — ABNORMAL HIGH (ref 70–99)
Glucose-Capillary: 82 mg/dL (ref 70–99)

## 2010-04-16 LAB — URINE MICROSCOPIC-ADD ON

## 2010-04-17 LAB — APTT: aPTT: 31 s (ref 24–37)

## 2010-04-17 LAB — CBC
HCT: 27.5 % — ABNORMAL LOW (ref 39.0–52.0)
HCT: 28.3 % — ABNORMAL LOW (ref 39.0–52.0)
HCT: 30.5 % — ABNORMAL LOW (ref 39.0–52.0)
HCT: 32.5 % — ABNORMAL LOW (ref 39.0–52.0)
HCT: 33 % — ABNORMAL LOW (ref 39.0–52.0)
HCT: 33.3 % — ABNORMAL LOW (ref 39.0–52.0)
HCT: 33.5 % — ABNORMAL LOW (ref 39.0–52.0)
Hemoglobin: 10.6 g/dL — ABNORMAL LOW (ref 13.0–17.0)
Hemoglobin: 10.6 g/dL — ABNORMAL LOW (ref 13.0–17.0)
Hemoglobin: 8.5 g/dL — ABNORMAL LOW (ref 13.0–17.0)
Hemoglobin: 9 g/dL — ABNORMAL LOW (ref 13.0–17.0)
Hemoglobin: 9.1 g/dL — ABNORMAL LOW (ref 13.0–17.0)
Hemoglobin: 9.3 g/dL — ABNORMAL LOW (ref 13.0–17.0)
MCHC: 32.3 g/dL (ref 30.0–36.0)
MCHC: 32.5 g/dL (ref 30.0–36.0)
MCHC: 32.6 g/dL (ref 30.0–36.0)
MCHC: 32.6 g/dL (ref 30.0–36.0)
MCHC: 32.8 g/dL (ref 30.0–36.0)
MCHC: 32.8 g/dL (ref 30.0–36.0)
MCHC: 33 g/dL (ref 30.0–36.0)
MCV: 70 fL — ABNORMAL LOW (ref 78.0–100.0)
MCV: 70.2 fL — ABNORMAL LOW (ref 78.0–100.0)
MCV: 70.2 fL — ABNORMAL LOW (ref 78.0–100.0)
MCV: 70.8 fL — ABNORMAL LOW (ref 78.0–100.0)
MCV: 72.6 fL — ABNORMAL LOW (ref 78.0–100.0)
MCV: 72.6 fL — ABNORMAL LOW (ref 78.0–100.0)
MCV: 72.9 fL — ABNORMAL LOW (ref 78.0–100.0)
Platelets: 127 10*3/uL — ABNORMAL LOW (ref 150–400)
Platelets: 128 10*3/uL — ABNORMAL LOW (ref 150–400)
Platelets: 133 10*3/uL — ABNORMAL LOW (ref 150–400)
Platelets: 136 10*3/uL — ABNORMAL LOW (ref 150–400)
Platelets: 139 10*3/uL — ABNORMAL LOW (ref 150–400)
Platelets: 149 10*3/uL — ABNORMAL LOW (ref 150–400)
Platelets: 154 10*3/uL (ref 150–400)
Platelets: 157 10*3/uL (ref 150–400)
Platelets: 164 10*3/uL (ref 150–400)
Platelets: 167 10*3/uL (ref 150–400)
RBC: 3.83 MIL/uL — ABNORMAL LOW (ref 4.22–5.81)
RBC: 3.91 MIL/uL — ABNORMAL LOW (ref 4.22–5.81)
RBC: 3.99 MIL/uL — ABNORMAL LOW (ref 4.22–5.81)
RDW: 18.1 % — ABNORMAL HIGH (ref 11.5–15.5)
RDW: 18.4 % — ABNORMAL HIGH (ref 11.5–15.5)
RDW: 18.5 % — ABNORMAL HIGH (ref 11.5–15.5)
RDW: 18.5 % — ABNORMAL HIGH (ref 11.5–15.5)
RDW: 20.7 % — ABNORMAL HIGH (ref 11.5–15.5)
RDW: 21 % — ABNORMAL HIGH (ref 11.5–15.5)
RDW: 21.1 % — ABNORMAL HIGH (ref 11.5–15.5)
RDW: 21.1 % — ABNORMAL HIGH (ref 11.5–15.5)
RDW: 21.3 % — ABNORMAL HIGH (ref 11.5–15.5)
RDW: 21.4 % — ABNORMAL HIGH (ref 11.5–15.5)
RDW: 21.5 % — ABNORMAL HIGH (ref 11.5–15.5)
WBC: 5.1 10*3/uL (ref 4.0–10.5)
WBC: 5.4 10*3/uL (ref 4.0–10.5)
WBC: 6.2 10*3/uL (ref 4.0–10.5)
WBC: 6.2 10*3/uL (ref 4.0–10.5)
WBC: 6.7 10*3/uL (ref 4.0–10.5)
WBC: 7.7 10*3/uL (ref 4.0–10.5)

## 2010-04-17 LAB — COMPREHENSIVE METABOLIC PANEL
AST: 15 U/L (ref 0–37)
Albumin: 2.9 g/dL — ABNORMAL LOW (ref 3.5–5.2)
Calcium: 8.6 mg/dL (ref 8.4–10.5)
Creatinine, Ser: 1.24 mg/dL (ref 0.4–1.5)
GFR calc Af Amer: >60 mL/min (ref >60)
GFR calc non Af Amer: 56 mL/min — ABNORMAL LOW (ref >60)

## 2010-04-17 LAB — CROSSMATCH
ABO/RH(D): A POS
Antibody Screen: NEGATIVE
Antibody Screen: NEGATIVE

## 2010-04-17 LAB — GLUCOSE, CAPILLARY
Glucose-Capillary: 101 mg/dL — ABNORMAL HIGH (ref 70–99)
Glucose-Capillary: 101 mg/dL — ABNORMAL HIGH (ref 70–99)
Glucose-Capillary: 105 mg/dL — ABNORMAL HIGH (ref 70–99)
Glucose-Capillary: 107 mg/dL — ABNORMAL HIGH (ref 70–99)
Glucose-Capillary: 107 mg/dL — ABNORMAL HIGH (ref 70–99)
Glucose-Capillary: 107 mg/dL — ABNORMAL HIGH (ref 70–99)
Glucose-Capillary: 109 mg/dL — ABNORMAL HIGH (ref 70–99)
Glucose-Capillary: 109 mg/dL — ABNORMAL HIGH (ref 70–99)
Glucose-Capillary: 110 mg/dL — ABNORMAL HIGH (ref 70–99)
Glucose-Capillary: 111 mg/dL — ABNORMAL HIGH (ref 70–99)
Glucose-Capillary: 116 mg/dL — ABNORMAL HIGH (ref 70–99)
Glucose-Capillary: 117 mg/dL — ABNORMAL HIGH (ref 70–99)
Glucose-Capillary: 118 mg/dL — ABNORMAL HIGH (ref 70–99)
Glucose-Capillary: 120 mg/dL — ABNORMAL HIGH (ref 70–99)
Glucose-Capillary: 121 mg/dL — ABNORMAL HIGH (ref 70–99)
Glucose-Capillary: 121 mg/dL — ABNORMAL HIGH (ref 70–99)
Glucose-Capillary: 122 mg/dL — ABNORMAL HIGH (ref 70–99)
Glucose-Capillary: 141 mg/dL — ABNORMAL HIGH (ref 70–99)
Glucose-Capillary: 143 mg/dL — ABNORMAL HIGH (ref 70–99)
Glucose-Capillary: 149 mg/dL — ABNORMAL HIGH (ref 70–99)
Glucose-Capillary: 155 mg/dL — ABNORMAL HIGH (ref 70–99)
Glucose-Capillary: 89 mg/dL (ref 70–99)
Glucose-Capillary: 91 mg/dL (ref 70–99)
Glucose-Capillary: 95 mg/dL (ref 70–99)
Glucose-Capillary: 96 mg/dL (ref 70–99)
Glucose-Capillary: 96 mg/dL (ref 70–99)
Glucose-Capillary: 97 mg/dL (ref 70–99)
Glucose-Capillary: 99 mg/dL (ref 70–99)
Glucose-Capillary: 99 mg/dL (ref 70–99)

## 2010-04-17 LAB — DIFFERENTIAL
Eosinophils Relative: 7 % — ABNORMAL HIGH (ref 0–5)
Lymphs Abs: 0.9 10*3/uL (ref 0.7–4.0)
Monocytes Relative: 7 % (ref 3–12)
Neutro Abs: 4.4 10*3/uL (ref 1.7–7.7)

## 2010-04-17 LAB — URINALYSIS, ROUTINE W REFLEX MICROSCOPIC
Nitrite: NEGATIVE
Specific Gravity, Urine: 1.022 (ref 1.005–1.030)
Urobilinogen, UA: 0.2 mg/dL (ref 0.0–1.0)
pH: 5 (ref 5.0–8.0)

## 2010-04-17 LAB — BASIC METABOLIC PANEL
BUN: 23 mg/dL (ref 6–23)
BUN: 24 mg/dL — ABNORMAL HIGH (ref 6–23)
BUN: 24 mg/dL — ABNORMAL HIGH (ref 6–23)
CO2: 32 mEq/L (ref 19–32)
CO2: 32 mEq/L (ref 19–32)
Calcium: 7.9 mg/dL — ABNORMAL LOW (ref 8.4–10.5)
Calcium: 8.1 mg/dL — ABNORMAL LOW (ref 8.4–10.5)
Chloride: 109 mEq/L (ref 96–112)
Creatinine, Ser: 1.07 mg/dL (ref 0.4–1.5)
GFR calc non Af Amer: >60 mL/min (ref >60)
GFR calc non Af Amer: >60 mL/min (ref >60)
Glucose, Bld: 103 mg/dL — ABNORMAL HIGH (ref 70–99)
Glucose, Bld: 110 mg/dL — ABNORMAL HIGH (ref 70–99)
Glucose, Bld: 93 mg/dL (ref 70–99)
Potassium: 3.9 mEq/L (ref 3.5–5.1)
Potassium: 4.1 mEq/L (ref 3.5–5.1)
Potassium: 4.1 mEq/L (ref 3.5–5.1)
Potassium: 4.4 mEq/L (ref 3.5–5.1)
Sodium: 141 mEq/L (ref 135–145)
Sodium: 145 mEq/L (ref 135–145)

## 2010-04-17 LAB — HEMOGLOBIN A1C: Mean Plasma Glucose: 120 mg/dL

## 2010-04-17 LAB — MAGNESIUM: Magnesium: 2.4 mg/dL (ref 1.5–2.5)

## 2010-04-17 LAB — URINE CULTURE

## 2010-04-17 LAB — BASIC METABOLIC PANEL WITH GFR
BUN: 22 mg/dL (ref 6–23)
CO2: 26 meq/L (ref 19–32)
Calcium: 8.2 mg/dL — ABNORMAL LOW (ref 8.4–10.5)
Chloride: 108 meq/L (ref 96–112)
Creatinine, Ser: 0.9 mg/dL (ref 0.4–1.5)
GFR calc non Af Amer: >60 mL/min
Glucose, Bld: 86 mg/dL (ref 70–99)
Potassium: 4.1 meq/L (ref 3.5–5.1)
Sodium: 139 meq/L (ref 135–145)

## 2010-04-17 LAB — URINE MICROSCOPIC-ADD ON

## 2010-04-17 LAB — HEMOCCULT GUIAC POC 1CARD (OFFICE): Fecal Occult Bld: NEGATIVE

## 2010-04-17 LAB — ABO/RH: ABO/RH(D): A POS

## 2010-04-22 LAB — BASIC METABOLIC PANEL
BUN: 21 mg/dL (ref 6–23)
BUN: 23 mg/dL (ref 6–23)
BUN: 26 mg/dL — ABNORMAL HIGH (ref 6–23)
BUN: 26 mg/dL — ABNORMAL HIGH (ref 6–23)
BUN: 26 mg/dL — ABNORMAL HIGH (ref 6–23)
BUN: 27 mg/dL — ABNORMAL HIGH (ref 6–23)
BUN: 28 mg/dL — ABNORMAL HIGH (ref 6–23)
BUN: 30 mg/dL — ABNORMAL HIGH (ref 6–23)
BUN: 32 mg/dL — ABNORMAL HIGH (ref 6–23)
BUN: 32 mg/dL — ABNORMAL HIGH (ref 6–23)
CO2: 20 mEq/L (ref 19–32)
CO2: 20 mEq/L (ref 19–32)
CO2: 21 mEq/L (ref 19–32)
CO2: 22 mEq/L (ref 19–32)
CO2: 22 mEq/L (ref 19–32)
CO2: 22 mEq/L (ref 19–32)
CO2: 22 mEq/L (ref 19–32)
CO2: 23 mEq/L (ref 19–32)
CO2: 28 mEq/L (ref 19–32)
CO2: 31 mEq/L (ref 19–32)
CO2: 32 mEq/L (ref 19–32)
Calcium: 7.5 mg/dL — ABNORMAL LOW (ref 8.4–10.5)
Calcium: 7.5 mg/dL — ABNORMAL LOW (ref 8.4–10.5)
Calcium: 7.6 mg/dL — ABNORMAL LOW (ref 8.4–10.5)
Calcium: 7.6 mg/dL — ABNORMAL LOW (ref 8.4–10.5)
Calcium: 7.8 mg/dL — ABNORMAL LOW (ref 8.4–10.5)
Calcium: 7.8 mg/dL — ABNORMAL LOW (ref 8.4–10.5)
Chloride: 104 mEq/L (ref 96–112)
Chloride: 108 mEq/L (ref 96–112)
Chloride: 108 mEq/L (ref 96–112)
Chloride: 109 mEq/L (ref 96–112)
Chloride: 109 mEq/L (ref 96–112)
Chloride: 110 mEq/L (ref 96–112)
Chloride: 110 mEq/L (ref 96–112)
Chloride: 111 mEq/L (ref 96–112)
Creatinine, Ser: 0.87 mg/dL (ref 0.4–1.5)
Creatinine, Ser: 0.92 mg/dL (ref 0.4–1.5)
Creatinine, Ser: 1.05 mg/dL (ref 0.4–1.5)
Creatinine, Ser: 1.06 mg/dL (ref 0.4–1.5)
Creatinine, Ser: 1.15 mg/dL (ref 0.4–1.5)
Creatinine, Ser: 1.22 mg/dL (ref 0.4–1.5)
Creatinine, Ser: 1.26 mg/dL (ref 0.4–1.5)
Creatinine, Ser: 1.26 mg/dL (ref 0.4–1.5)
Creatinine, Ser: 1.4 mg/dL (ref 0.4–1.5)
GFR calc Af Amer: 59 mL/min — ABNORMAL LOW (ref >60)
GFR calc Af Amer: >60 mL/min (ref >60)
GFR calc Af Amer: >60 mL/min (ref >60)
GFR calc Af Amer: >60 mL/min (ref >60)
GFR calc Af Amer: >60 mL/min (ref >60)
GFR calc Af Amer: >60 mL/min (ref >60)
GFR calc Af Amer: >60 mL/min (ref >60)
GFR calc Af Amer: >60 mL/min (ref >60)
GFR calc Af Amer: >60 mL/min (ref >60)
GFR calc Af Amer: >60 mL/min (ref >60)
GFR calc non Af Amer: 49 mL/min — ABNORMAL LOW (ref >60)
GFR calc non Af Amer: 53 mL/min — ABNORMAL LOW (ref >60)
GFR calc non Af Amer: 54 mL/min — ABNORMAL LOW (ref >60)
GFR calc non Af Amer: 56 mL/min — ABNORMAL LOW (ref >60)
GFR calc non Af Amer: >60 mL/min (ref >60)
GFR calc non Af Amer: >60 mL/min (ref >60)
GFR calc non Af Amer: >60 mL/min (ref >60)
GFR calc non Af Amer: >60 mL/min (ref >60)
GFR calc non Af Amer: >60 mL/min (ref >60)
Glucose, Bld: 110 mg/dL — ABNORMAL HIGH (ref 70–99)
Glucose, Bld: 111 mg/dL — ABNORMAL HIGH (ref 70–99)
Glucose, Bld: 115 mg/dL — ABNORMAL HIGH (ref 70–99)
Glucose, Bld: 119 mg/dL — ABNORMAL HIGH (ref 70–99)
Glucose, Bld: 124 mg/dL — ABNORMAL HIGH (ref 70–99)
Glucose, Bld: 133 mg/dL — ABNORMAL HIGH (ref 70–99)
Glucose, Bld: 145 mg/dL — ABNORMAL HIGH (ref 70–99)
Glucose, Bld: 227 mg/dL — ABNORMAL HIGH (ref 70–99)
Glucose, Bld: 75 mg/dL (ref 70–99)
Glucose, Bld: 92 mg/dL (ref 70–99)
Glucose, Bld: 94 mg/dL (ref 70–99)
Potassium: 3 mEq/L — ABNORMAL LOW (ref 3.5–5.1)
Potassium: 3.5 mEq/L (ref 3.5–5.1)
Potassium: 3.6 mEq/L (ref 3.5–5.1)
Potassium: 3.8 mEq/L (ref 3.5–5.1)
Potassium: 3.9 mEq/L (ref 3.5–5.1)
Potassium: 3.9 mEq/L (ref 3.5–5.1)
Potassium: 3.9 mEq/L (ref 3.5–5.1)
Potassium: 3.9 mEq/L (ref 3.5–5.1)
Potassium: 4.1 mEq/L (ref 3.5–5.1)
Potassium: 4.3 mEq/L (ref 3.5–5.1)
Sodium: 135 mEq/L (ref 135–145)
Sodium: 136 mEq/L (ref 135–145)
Sodium: 137 mEq/L (ref 135–145)
Sodium: 137 mEq/L (ref 135–145)
Sodium: 138 mEq/L (ref 135–145)
Sodium: 138 mEq/L (ref 135–145)
Sodium: 139 mEq/L (ref 135–145)
Sodium: 139 mEq/L (ref 135–145)
Sodium: 140 mEq/L (ref 135–145)
Sodium: 143 mEq/L (ref 135–145)
Sodium: 145 mEq/L (ref 135–145)
Sodium: 149 mEq/L — ABNORMAL HIGH (ref 135–145)

## 2010-04-22 LAB — DIFFERENTIAL
Basophils Absolute: 0 10*3/uL (ref 0.0–0.1)
Basophils Relative: 0 % (ref 0–1)
Eosinophils Absolute: 0.1 10*3/uL (ref 0.0–0.7)
Eosinophils Relative: 0 % (ref 0–5)
Eosinophils Relative: 1 % (ref 0–5)
Lymphs Abs: 0.5 10*3/uL — ABNORMAL LOW (ref 0.7–4.0)
Lymphs Abs: 2.3 10*3/uL (ref 0.7–4.0)
Monocytes Absolute: 0.5 10*3/uL (ref 0.1–1.0)
Monocytes Absolute: 0.5 10*3/uL (ref 0.1–1.0)

## 2010-04-22 LAB — CBC
HCT: 23.6 % — ABNORMAL LOW (ref 39.0–52.0)
HCT: 25.3 % — ABNORMAL LOW (ref 39.0–52.0)
HCT: 26.6 % — ABNORMAL LOW (ref 39.0–52.0)
Hemoglobin: 7.7 g/dL — ABNORMAL LOW (ref 13.0–17.0)
Hemoglobin: 8.2 g/dL — ABNORMAL LOW (ref 13.0–17.0)
Hemoglobin: 8.2 g/dL — ABNORMAL LOW (ref 13.0–17.0)
Hemoglobin: 9.1 g/dL — ABNORMAL LOW (ref 13.0–17.0)
MCHC: 32 g/dL (ref 30.0–36.0)
MCHC: 32.2 g/dL (ref 30.0–36.0)
MCHC: 32.5 g/dL (ref 30.0–36.0)
MCHC: 32.6 g/dL (ref 30.0–36.0)
MCHC: 32.7 g/dL (ref 30.0–36.0)
MCV: 68.9 fL — ABNORMAL LOW (ref 78.0–100.0)
MCV: 69 fL — ABNORMAL LOW (ref 78.0–100.0)
MCV: 69 fL — ABNORMAL LOW (ref 78.0–100.0)
MCV: 69 fL — ABNORMAL LOW (ref 78.0–100.0)
MCV: 69.1 fL — ABNORMAL LOW (ref 78.0–100.0)
Platelets: 108 10*3/uL — ABNORMAL LOW (ref 150–400)
Platelets: 131 10*3/uL — ABNORMAL LOW (ref 150–400)
Platelets: 59 10*3/uL — ABNORMAL LOW (ref 150–400)
Platelets: 67 10*3/uL — ABNORMAL LOW (ref 150–400)
Platelets: 67 10*3/uL — ABNORMAL LOW (ref 150–400)
Platelets: 68 10*3/uL — ABNORMAL LOW (ref 150–400)
RBC: 3.44 MIL/uL — ABNORMAL LOW (ref 4.22–5.81)
RBC: 3.69 MIL/uL — ABNORMAL LOW (ref 4.22–5.81)
RBC: 4.03 MIL/uL — ABNORMAL LOW (ref 4.22–5.81)
RBC: 4.57 MIL/uL (ref 4.22–5.81)
RBC: 5.4 MIL/uL (ref 4.22–5.81)
RDW: 17.1 % — ABNORMAL HIGH (ref 11.5–15.5)
RDW: 17.3 % — ABNORMAL HIGH (ref 11.5–15.5)
RDW: 17.7 % — ABNORMAL HIGH (ref 11.5–15.5)
RDW: 17.8 % — ABNORMAL HIGH (ref 11.5–15.5)
RDW: 18.1 % — ABNORMAL HIGH (ref 11.5–15.5)
RDW: 18.2 % — ABNORMAL HIGH (ref 11.5–15.5)
WBC: 10.9 10*3/uL — ABNORMAL HIGH (ref 4.0–10.5)
WBC: 6.6 10*3/uL (ref 4.0–10.5)
WBC: 7.5 10*3/uL (ref 4.0–10.5)

## 2010-04-22 LAB — GLUCOSE, CAPILLARY
Glucose-Capillary: 103 mg/dL — ABNORMAL HIGH (ref 70–99)
Glucose-Capillary: 104 mg/dL — ABNORMAL HIGH (ref 70–99)
Glucose-Capillary: 104 mg/dL — ABNORMAL HIGH (ref 70–99)
Glucose-Capillary: 108 mg/dL — ABNORMAL HIGH (ref 70–99)
Glucose-Capillary: 109 mg/dL — ABNORMAL HIGH (ref 70–99)
Glucose-Capillary: 110 mg/dL — ABNORMAL HIGH (ref 70–99)
Glucose-Capillary: 115 mg/dL — ABNORMAL HIGH (ref 70–99)
Glucose-Capillary: 118 mg/dL — ABNORMAL HIGH (ref 70–99)
Glucose-Capillary: 118 mg/dL — ABNORMAL HIGH (ref 70–99)
Glucose-Capillary: 122 mg/dL — ABNORMAL HIGH (ref 70–99)
Glucose-Capillary: 128 mg/dL — ABNORMAL HIGH (ref 70–99)
Glucose-Capillary: 131 mg/dL — ABNORMAL HIGH (ref 70–99)
Glucose-Capillary: 132 mg/dL — ABNORMAL HIGH (ref 70–99)
Glucose-Capillary: 149 mg/dL — ABNORMAL HIGH (ref 70–99)
Glucose-Capillary: 172 mg/dL — ABNORMAL HIGH (ref 70–99)
Glucose-Capillary: 177 mg/dL — ABNORMAL HIGH (ref 70–99)
Glucose-Capillary: 236 mg/dL — ABNORMAL HIGH (ref 70–99)
Glucose-Capillary: 243 mg/dL — ABNORMAL HIGH (ref 70–99)
Glucose-Capillary: 246 mg/dL — ABNORMAL HIGH (ref 70–99)
Glucose-Capillary: 255 mg/dL — ABNORMAL HIGH (ref 70–99)
Glucose-Capillary: 267 mg/dL — ABNORMAL HIGH (ref 70–99)
Glucose-Capillary: 367 mg/dL — ABNORMAL HIGH (ref 70–99)
Glucose-Capillary: 66 mg/dL — ABNORMAL LOW (ref 70–99)
Glucose-Capillary: 76 mg/dL (ref 70–99)
Glucose-Capillary: 81 mg/dL (ref 70–99)
Glucose-Capillary: 84 mg/dL (ref 70–99)
Glucose-Capillary: 91 mg/dL (ref 70–99)
Glucose-Capillary: 95 mg/dL (ref 70–99)
Glucose-Capillary: 96 mg/dL (ref 70–99)
Glucose-Capillary: 97 mg/dL (ref 70–99)
Glucose-Capillary: 98 mg/dL (ref 70–99)
Glucose-Capillary: 99 mg/dL (ref 70–99)

## 2010-04-22 LAB — POCT I-STAT 3, ART BLOOD GAS (G3+)
Patient temperature: 35.4
TCO2: 24 mmol/L (ref 0–100)
pCO2 arterial: 46.8 mmHg — ABNORMAL HIGH (ref 35.0–45.0)
pH, Arterial: 7.273 — ABNORMAL LOW (ref 7.350–7.450)

## 2010-04-22 LAB — CARDIAC PANEL(CRET KIN+CKTOT+MB+TROPI)
CK, MB: 230.8 ng/mL (ref 0.3–4.0)
CK, MB: 369.2 ng/mL (ref 0.3–4.0)
Relative Index: 21.1 — ABNORMAL HIGH (ref 0.0–2.5)
Relative Index: 30.9 — ABNORMAL HIGH (ref 0.0–2.5)

## 2010-04-22 LAB — COMPREHENSIVE METABOLIC PANEL
ALT: 34 U/L (ref 0–53)
AST: 45 U/L — ABNORMAL HIGH (ref 0–37)
Albumin: 3.2 g/dL — ABNORMAL LOW (ref 3.5–5.2)
CO2: 20 mEq/L (ref 19–32)
Chloride: 108 mEq/L (ref 96–112)
GFR calc Af Amer: 56 mL/min — ABNORMAL LOW (ref >60)
GFR calc non Af Amer: 46 mL/min — ABNORMAL LOW (ref >60)
Sodium: 141 mEq/L (ref 135–145)
Total Bilirubin: 0.7 mg/dL (ref 0.3–1.2)

## 2010-04-22 LAB — BLOOD GAS, ARTERIAL
Acid-Base Excess: 1.5 mmol/L (ref 0.0–2.0)
Acid-base deficit: 4.2 mmol/L — ABNORMAL HIGH (ref 0.0–2.0)
Acid-base deficit: 5.3 mmol/L — ABNORMAL HIGH (ref 0.0–2.0)
Bicarbonate: 20 mEq/L (ref 20.0–24.0)
Bicarbonate: 27.2 mEq/L — ABNORMAL HIGH (ref 20.0–24.0)
Drawn by: 244901
FIO2: 0.4 %
FIO2: 0.4 %
FIO2: 100 %
FIO2: 40 %
MECHVT: 550 mL
MECHVT: 550 mL
O2 Saturation: 98.2 %
O2 Saturation: 99.3 %
O2 Saturation: 99.3 %
O2 Saturation: 99.4 %
PEEP: 5 cmH2O
PEEP: 5 cmH2O
PEEP: 5 cmH2O
Patient temperature: 91.4
Patient temperature: 98.6
Patient temperature: 98.6
Patient temperature: 98.6
Pressure support: 5 cmH2O
RATE: 14 resp/min
RATE: 14 resp/min
RATE: 22 resp/min
RATE: 22 resp/min
TCO2: 21.3 mmol/L (ref 0–100)
pCO2 arterial: 36.6 mmHg (ref 35.0–45.0)
pCO2 arterial: 37.2 mmHg (ref 35.0–45.0)
pCO2 arterial: 44 mmHg (ref 35.0–45.0)
pH, Arterial: 7.356 (ref 7.350–7.450)
pH, Arterial: 7.389 (ref 7.350–7.450)
pH, Arterial: 7.39 (ref 7.350–7.450)
pH, Arterial: 7.419 (ref 7.350–7.450)
pO2, Arterial: 113 mmHg — ABNORMAL HIGH (ref 80.0–100.0)
pO2, Arterial: 241 mmHg — ABNORMAL HIGH (ref 80.0–100.0)

## 2010-04-22 LAB — CULTURE, BLOOD (ROUTINE X 2): Culture: NO GROWTH

## 2010-04-22 LAB — POCT I-STAT, CHEM 8
BUN: 31 mg/dL — ABNORMAL HIGH (ref 6–23)
Chloride: 110 mEq/L (ref 96–112)
Creatinine, Ser: 1.3 mg/dL (ref 0.4–1.5)
Sodium: 140 mEq/L (ref 135–145)
TCO2: 20 mmol/L (ref 0–100)

## 2010-04-22 LAB — URINALYSIS, ROUTINE W REFLEX MICROSCOPIC
Bilirubin Urine: NEGATIVE
Hgb urine dipstick: NEGATIVE
Ketones, ur: NEGATIVE mg/dL
Nitrite: NEGATIVE
Protein, ur: 100 mg/dL — AB
Protein, ur: 30 mg/dL — AB
Specific Gravity, Urine: 1.017 (ref 1.005–1.030)
Urobilinogen, UA: 0.2 mg/dL (ref 0.0–1.0)
Urobilinogen, UA: 1 mg/dL (ref 0.0–1.0)

## 2010-04-22 LAB — POCT CARDIAC MARKERS
Myoglobin, poc: 321 ng/mL (ref 12–200)
Troponin i, poc: <0.05 ng/mL (ref 0.00–0.09)

## 2010-04-22 LAB — GRAM STAIN

## 2010-04-22 LAB — DIC (DISSEMINATED INTRAVASCULAR COAGULATION)PANEL
D-Dimer, Quant: 3.48 ug/mL-FEU — ABNORMAL HIGH (ref 0.00–0.48)
INR: 1.23 (ref 0.00–1.49)
Prothrombin Time: 15.4 seconds — ABNORMAL HIGH (ref 11.6–15.2)

## 2010-04-22 LAB — CULTURE, RESPIRATORY W GRAM STAIN

## 2010-04-22 LAB — HEPARIN INDUCED THROMBOCYTOPENIA PNL: Serotonin Release: NEGATIVE

## 2010-04-22 LAB — URINE CULTURE: Colony Count: >=100000

## 2010-04-22 LAB — PROTIME-INR
INR: 1.26 (ref 0.00–1.49)
INR: 1.31 (ref 0.00–1.49)
INR: 1.32 (ref 0.00–1.49)
Prothrombin Time: 16.2 seconds — ABNORMAL HIGH (ref 11.6–15.2)
Prothrombin Time: 16.7 seconds — ABNORMAL HIGH (ref 11.6–15.2)

## 2010-04-22 LAB — HEPARIN LEVEL (UNFRACTIONATED): Heparin Unfractionated: 0.45 IU/mL (ref 0.30–0.70)

## 2010-04-22 LAB — URINE MICROSCOPIC-ADD ON

## 2010-04-22 LAB — PHOSPHORUS: Phosphorus: 1.8 mg/dL — ABNORMAL LOW (ref 2.3–4.6)

## 2010-04-22 LAB — CALCIUM, IONIZED: Calcium, Ion: 1.11 mmol/L — ABNORMAL LOW (ref 1.12–1.32)

## 2010-04-22 LAB — BRAIN NATRIURETIC PEPTIDE: Pro B Natriuretic peptide (BNP): 30 pg/mL (ref 0.0–100.0)

## 2010-04-22 LAB — MAGNESIUM: Magnesium: 2 mg/dL (ref 1.5–2.5)

## 2010-04-24 ENCOUNTER — Encounter: Payer: Medicare Other | Admitting: Hematology and Oncology

## 2010-04-25 NOTE — Cardiovascular Report (Signed)
Summary: Office Visit   Office Visit   Imported By: Roderic Ovens 04/16/2010 13:59:13  _____________________________________________________________________  External Attachment:    Type:   Image     Comment:   External Document

## 2010-05-01 ENCOUNTER — Other Ambulatory Visit: Payer: Self-pay | Admitting: Hematology and Oncology

## 2010-05-01 ENCOUNTER — Encounter (HOSPITAL_BASED_OUTPATIENT_CLINIC_OR_DEPARTMENT_OTHER): Payer: Medicare Other | Admitting: Hematology and Oncology

## 2010-05-01 DIAGNOSIS — D649 Anemia, unspecified: Secondary | ICD-10-CM

## 2010-05-01 LAB — CBC & DIFF AND RETIC
Eosinophils Absolute: 0.2 10*3/uL (ref 0.0–0.5)
Immature Retic Fract: 9.3 % (ref 0.00–13.40)
MCV: 66.4 fL — ABNORMAL LOW (ref 79.3–98.0)
MONO%: 4.9 % (ref 0.0–14.0)
NEUT#: 4.5 10*3/uL (ref 1.5–6.5)
RBC: 4.76 10*6/uL (ref 4.20–5.82)
RDW: 17.4 % — ABNORMAL HIGH (ref 11.0–14.6)
Retic %: 1.15 % (ref 0.50–1.60)
Retic Ct Abs: 54.74 10*3/uL (ref 24.10–77.50)
WBC: 6.4 10*3/uL (ref 4.0–10.3)
lymph#: 1.3 10*3/uL (ref 0.9–3.3)

## 2010-05-01 LAB — URINALYSIS, MICROSCOPIC - CHCC
Bilirubin (Urine): NEGATIVE
Leukocyte Esterase: NEGATIVE
Protein: NEGATIVE mg/dL
RBC count: NEGATIVE (ref 0–2)
pH: 5 (ref 4.6–8.0)

## 2010-05-01 LAB — MORPHOLOGY

## 2010-05-01 LAB — COMPREHENSIVE METABOLIC PANEL
Albumin: 3.8 g/dL (ref 3.5–5.2)
BUN: 32 mg/dL — ABNORMAL HIGH (ref 6–23)
CO2: 28 mEq/L (ref 19–32)
Calcium: 8.9 mg/dL (ref 8.4–10.5)
Chloride: 108 mEq/L (ref 96–112)
Glucose, Bld: 90 mg/dL (ref 70–99)
Potassium: 4.5 mEq/L (ref 3.5–5.3)
Sodium: 143 mEq/L (ref 135–145)
Total Protein: 6.7 g/dL (ref 6.0–8.3)

## 2010-05-04 LAB — IGG, IGA, IGM: IgM, Serum: 19 mg/dL — ABNORMAL LOW (ref 60–263)

## 2010-05-04 LAB — HAPTOGLOBIN: Haptoglobin: 93 mg/dL (ref 16–200)

## 2010-05-04 LAB — HEMOGLOBINOPATHY EVALUATION
Hgb A: 92.8 % — ABNORMAL LOW (ref 96.8–97.8)
Hgb F Quant: 3 % — ABNORMAL HIGH (ref 0.0–2.0)
Hgb S Quant: 0 % (ref 0.0–0.0)

## 2010-05-04 LAB — IRON AND TIBC
Iron: 109 ug/dL (ref 42–165)
TIBC: 293 ug/dL (ref 215–435)
UIBC: 184 ug/dL

## 2010-05-04 LAB — PROTEIN ELECTROPHORESIS, SERUM, WITH REFLEX
Beta 2: 12.5 % — ABNORMAL HIGH (ref 3.2–6.5)
Gamma Globulin: 5.7 % — ABNORMAL LOW (ref 11.1–18.8)

## 2010-05-06 ENCOUNTER — Other Ambulatory Visit (HOSPITAL_COMMUNITY): Payer: Medicare Other

## 2010-05-08 ENCOUNTER — Other Ambulatory Visit: Payer: Self-pay | Admitting: Hematology and Oncology

## 2010-05-08 DIAGNOSIS — D649 Anemia, unspecified: Secondary | ICD-10-CM

## 2010-05-13 ENCOUNTER — Ambulatory Visit (HOSPITAL_COMMUNITY)
Admission: RE | Admit: 2010-05-13 | Discharge: 2010-05-13 | Disposition: A | Discharge status: Home / Self Care | Payer: Medicare Other | Source: Ambulatory Visit | Attending: Hematology and Oncology | Admitting: Hematology and Oncology

## 2010-05-13 DIAGNOSIS — M51379 Other intervertebral disc degeneration, lumbosacral region without mention of lumbar back pain or lower extremity pain: Secondary | ICD-10-CM | POA: Insufficient documentation

## 2010-05-13 DIAGNOSIS — M5137 Other intervertebral disc degeneration, lumbosacral region: Secondary | ICD-10-CM | POA: Insufficient documentation

## 2010-05-13 DIAGNOSIS — M5030 Other cervical disc degeneration, unspecified cervical region: Secondary | ICD-10-CM | POA: Insufficient documentation

## 2010-05-13 DIAGNOSIS — D649 Anemia, unspecified: Secondary | ICD-10-CM

## 2010-05-13 DIAGNOSIS — IMO0002 Reserved for concepts with insufficient information to code with codable children: Secondary | ICD-10-CM | POA: Insufficient documentation

## 2010-05-13 DIAGNOSIS — L723 Sebaceous cyst: Secondary | ICD-10-CM | POA: Insufficient documentation

## 2010-05-22 ENCOUNTER — Encounter (HOSPITAL_BASED_OUTPATIENT_CLINIC_OR_DEPARTMENT_OTHER): Payer: Medicare Other | Admitting: Hematology and Oncology

## 2010-05-22 ENCOUNTER — Other Ambulatory Visit: Payer: Self-pay | Admitting: Hematology and Oncology

## 2010-05-22 DIAGNOSIS — D539 Nutritional anemia, unspecified: Secondary | ICD-10-CM

## 2010-05-22 DIAGNOSIS — E538 Deficiency of other specified B group vitamins: Secondary | ICD-10-CM

## 2010-05-22 DIAGNOSIS — D649 Anemia, unspecified: Secondary | ICD-10-CM

## 2010-05-22 DIAGNOSIS — D696 Thrombocytopenia, unspecified: Secondary | ICD-10-CM

## 2010-05-22 LAB — CBC WITH DIFFERENTIAL/PLATELET
BASO%: 0.4 % (ref 0.0–2.0)
EOS%: 2.6 % (ref 0.0–7.0)
HGB: 9.8 g/dL — ABNORMAL LOW (ref 13.0–17.1)
MCH: 21.5 pg — ABNORMAL LOW (ref 27.2–33.4)
MCHC: 32 g/dL (ref 32.0–36.0)
MONO%: 6.6 % (ref 0.0–14.0)
RBC: 4.55 10*6/uL (ref 4.20–5.82)
RDW: 18.5 % — ABNORMAL HIGH (ref 11.0–14.6)
lymph#: 1 10*3/uL (ref 0.9–3.3)

## 2010-06-19 ENCOUNTER — Encounter (HOSPITAL_BASED_OUTPATIENT_CLINIC_OR_DEPARTMENT_OTHER): Payer: Medicare Other | Admitting: Hematology and Oncology

## 2010-06-19 DIAGNOSIS — E538 Deficiency of other specified B group vitamins: Secondary | ICD-10-CM

## 2010-07-04 ENCOUNTER — Encounter: Payer: Medicare Other | Admitting: *Deleted

## 2010-07-08 ENCOUNTER — Ambulatory Visit (INDEPENDENT_AMBULATORY_CARE_PROVIDER_SITE_OTHER): Payer: Medicare Other | Admitting: Cardiovascular Disease

## 2010-07-08 ENCOUNTER — Ambulatory Visit (INDEPENDENT_AMBULATORY_CARE_PROVIDER_SITE_OTHER)
Admission: RE | Admit: 2010-07-08 | Discharge: 2010-07-08 | Disposition: A | Discharge status: Home / Self Care | Payer: Medicare Other | Source: Ambulatory Visit | Attending: Cardiovascular Disease | Admitting: Cardiovascular Disease

## 2010-07-08 ENCOUNTER — Encounter: Payer: Self-pay | Admitting: Cardiovascular Disease

## 2010-07-08 ENCOUNTER — Other Ambulatory Visit: Payer: Self-pay | Admitting: Internal Medicine

## 2010-07-08 ENCOUNTER — Ambulatory Visit (INDEPENDENT_AMBULATORY_CARE_PROVIDER_SITE_OTHER): Payer: Medicare Other | Admitting: *Deleted

## 2010-07-08 DIAGNOSIS — J449 Chronic obstructive pulmonary disease, unspecified: Secondary | ICD-10-CM

## 2010-07-08 DIAGNOSIS — I428 Other cardiomyopathies: Secondary | ICD-10-CM

## 2010-07-08 DIAGNOSIS — I2510 Atherosclerotic heart disease of native coronary artery without angina pectoris: Secondary | ICD-10-CM

## 2010-07-08 DIAGNOSIS — R0602 Shortness of breath: Secondary | ICD-10-CM

## 2010-07-08 DIAGNOSIS — R609 Edema, unspecified: Secondary | ICD-10-CM

## 2010-07-08 DIAGNOSIS — I219 Acute myocardial infarction, unspecified: Secondary | ICD-10-CM

## 2010-07-08 DIAGNOSIS — R0600 Dyspnea, unspecified: Secondary | ICD-10-CM

## 2010-07-08 DIAGNOSIS — E785 Hyperlipidemia, unspecified: Secondary | ICD-10-CM

## 2010-07-08 DIAGNOSIS — R0609 Other forms of dyspnea: Secondary | ICD-10-CM

## 2010-07-08 DIAGNOSIS — I469 Cardiac arrest, cause unspecified: Secondary | ICD-10-CM

## 2010-07-08 LAB — BASIC METABOLIC PANEL
Chloride: 110 mEq/L (ref 96–112)
GFR: 45.39 mL/min — ABNORMAL LOW (ref >60.00)
Potassium: 4.8 mEq/L (ref 3.5–5.1)
Sodium: 143 mEq/L (ref 135–145)

## 2010-07-08 NOTE — Assessment & Plan Note (Signed)
I think this is more COPD.  Check BMET and BNP CXR at [facility].  F/U pulmonary this week CoreValue activated to track volume status

## 2010-07-08 NOTE — Assessment & Plan Note (Signed)
Cholesterol is at goal.  Continue current dose of statin and diet Rx.  No myalgias or side effects.  F/U  LFT's in 6 months. No results found for this basename: LDLCALC             

## 2010-07-08 NOTE — Assessment & Plan Note (Signed)
AICD checked today and fine.  Will see in office q3 months since he has difficulty with telephone equipment.  CoreValue turned on today to track volume

## 2010-07-08 NOTE — Assessment & Plan Note (Signed)
Continue inhalers.  Concern for mild flair.  Refer to see pulm. This week  Consider low dose lasix like 10mg  daily if put on prednisone

## 2010-07-08 NOTE — Progress Notes (Signed)
icd check in clinic  

## 2010-07-08 NOTE — Assessment & Plan Note (Signed)
No angina. Stents to Ramus, Circ and RCA  Lifelong ASA and Plavix

## 2010-07-08 NOTE — Patient Instructions (Addendum)
Your physician recommends that you schedule a follow-up appointment in: 3 months with Dr Eden Emms and have his device checked the same day  Your physician recommends that you return for lab work today  BMP/BNP ---SOB  A chest x-ray takes a picture of the organs and structures inside the chest, including the heart, lungs, and blood vessels. This test can show several things, including, whether the heart is enlarges; whether fluid is building up in the lungs; and whether pacemaker / defibrillator leads are still in place.--[facility] office

## 2010-07-08 NOTE — Assessment & Plan Note (Signed)
From varicosities.  Will check BMET and BNP.  Consider adding low dose diuretic pending labs and CXR

## 2010-07-08 NOTE — Progress Notes (Signed)
[name] [name] is seen today after his AICD implant. He is a sudden death survivor with 3 stents. EF by MRI is 31% with a large scar. 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 He still has good quality of life and family support. He has returned to work. He has stopped smoking since his arrest . He denies SSCP, , or syncope.l Device has not fired  Wound healed well. Compliant with meds and no SSCP.  His grandaughter plays softball with my daughter and I get to see [name] at the ballfield.  He looks good.  Seems more SOB today.  Has mild wheezing.  Has inhalers at home but should F/U with our pulmonary doctors who saw him in hospital.  Will check BMET/BNP and CXR.  Has mild chronic LE edema with varicosities.  Depending on lab work and CXR may start low dose diuretic.  Has a St Jude device so no optivol available   However I spoke with [name] and [employer] has corevalue which is similar.  Unfortunatly [name] did not have this feature turned on.  We will activate it and track it since his dypnea is more likely COPD but certainly is at risk for CHF  05/03/09 Stent to Ramus, circ and RCA Given number of stents will stay on Plavix indefinately  Does not seem to understand telephone procedure for checking AICD.  Due to have it checked.  Discussed with [name] and will check it in office today.  Cancel telephone telemetry and see in office q13months  ROS: Denies fever, malais, weight loss, blurry vision, decreased visual acuity, cough, sputum, SOB, hemoptysis, pleuritic pain, palpitaitons, heartburn, abdominal pain, melena, lower extremity edema, claudication, or rash.  All other systems reviewed and negative  General: Affect appropriate Chronically ill HEENT: normal Neck supple with no adenopathy JVP normal no bruits no thyromegaly Lungs clear with no wheezing and good diaphragmatic motion Heart:  S1/S2 no murmur,rub, gallop or click PMI normal Abdomen: benighn, BS positve, no  tenderness, no AAA no bruit.  No HSM or HJR Distal pulses intact with no bruits Plus one bilateral  Edema with varicose veins Neuro non-focal Skin warm and dry No muscular weakness AICD under left clavicle   Current Outpatient Prescriptions  Medication Sig Dispense Refill  . albuterol (PROVENTIL) (2.5 MG/3ML) 0.083% nebulizer solution Take 2.5 mg by nebulization every 6 (six) hours as needed.        . ALPRAZolam (XANAX) 0.25 MG tablet Take 0.25 mg by mouth at bedtime as needed.        Marland Kitchen aspirin 81 MG tablet Take 81 mg by mouth daily.        . clopidogrel (PLAVIX) 75 MG tablet Take 75 mg by mouth daily.        Marland Kitchen dextromethorphan-guaiFENesin (MUCINEX DM) 30-600 MG per 12 hr tablet Take 2 tablets by mouth every 12 (twelve) hours.        . fish oil-omega-3 fatty acids 1000 MG capsule Take 2 g by mouth daily.        Marland Kitchen losartan (COZAAR) 25 MG tablet Take 25 mg by mouth daily.        . metoprolol tartrate (LOPRESSOR) 25 MG tablet 25 mg. 1/2 po bid       . rosuvastatin (CRESTOR) 20 MG tablet Take 20 mg by mouth daily.        . Tamsulosin HCl (FLOMAX) 0.4 MG CAPS Take 0.4 mg by mouth daily.       Marland Kitchen  tiotropium (SPIRIVA) 18 MCG inhalation capsule Place 18 mcg into inhaler and inhale daily.        Marland Kitchen DISCONTD: acetaminophen (TYLENOL) 325 MG tablet Take 650 mg by mouth every 6 (six) hours as needed.          Allergies  Review of patient's allergies indicates no known allergies.  Electrocardiogram:  Assessment and Plan

## 2010-07-17 ENCOUNTER — Encounter (HOSPITAL_BASED_OUTPATIENT_CLINIC_OR_DEPARTMENT_OTHER): Payer: Medicare Other | Admitting: Hematology and Oncology

## 2010-07-17 DIAGNOSIS — D539 Nutritional anemia, unspecified: Secondary | ICD-10-CM

## 2010-07-17 DIAGNOSIS — D649 Anemia, unspecified: Secondary | ICD-10-CM

## 2010-07-17 DIAGNOSIS — E538 Deficiency of other specified B group vitamins: Secondary | ICD-10-CM

## 2010-07-17 DIAGNOSIS — D696 Thrombocytopenia, unspecified: Secondary | ICD-10-CM

## 2010-08-07 ENCOUNTER — Other Ambulatory Visit: Payer: Self-pay | Admitting: *Deleted

## 2010-08-07 ENCOUNTER — Ambulatory Visit (HOSPITAL_BASED_OUTPATIENT_CLINIC_OR_DEPARTMENT_OTHER)
Admission: RE | Admit: 2010-08-07 | Discharge: 2010-08-07 | Disposition: A | Discharge status: Home / Self Care | Payer: Medicare Other | Source: Ambulatory Visit | Attending: Specialist | Admitting: Specialist

## 2010-08-07 DIAGNOSIS — Z7982 Long term (current) use of aspirin: Secondary | ICD-10-CM | POA: Insufficient documentation

## 2010-08-07 DIAGNOSIS — H269 Unspecified cataract: Secondary | ICD-10-CM | POA: Insufficient documentation

## 2010-08-07 DIAGNOSIS — I2510 Atherosclerotic heart disease of native coronary artery without angina pectoris: Secondary | ICD-10-CM | POA: Insufficient documentation

## 2010-08-07 DIAGNOSIS — E119 Type 2 diabetes mellitus without complications: Secondary | ICD-10-CM | POA: Insufficient documentation

## 2010-08-07 DIAGNOSIS — Z01812 Encounter for preprocedural laboratory examination: Secondary | ICD-10-CM | POA: Insufficient documentation

## 2010-08-07 DIAGNOSIS — J449 Chronic obstructive pulmonary disease, unspecified: Secondary | ICD-10-CM | POA: Insufficient documentation

## 2010-08-07 DIAGNOSIS — I252 Old myocardial infarction: Secondary | ICD-10-CM | POA: Insufficient documentation

## 2010-08-07 DIAGNOSIS — J4489 Other specified chronic obstructive pulmonary disease: Secondary | ICD-10-CM | POA: Insufficient documentation

## 2010-08-07 DIAGNOSIS — Z79899 Other long term (current) drug therapy: Secondary | ICD-10-CM | POA: Insufficient documentation

## 2010-08-07 LAB — POCT I-STAT 4, (NA,K, GLUC, HGB,HCT)
Glucose, Bld: 106 mg/dL — ABNORMAL HIGH (ref 70–99)
HCT: 32 % — ABNORMAL LOW (ref 39.0–52.0)
Hemoglobin: 10.9 g/dL — ABNORMAL LOW (ref 13.0–17.0)

## 2010-08-07 LAB — GLUCOSE, CAPILLARY: Glucose-Capillary: 109 mg/dL — ABNORMAL HIGH (ref 70–99)

## 2010-08-07 MED ORDER — CLOPIDOGREL BISULFATE 75 MG PO TABS: 75.0000 mg | ORAL_TABLET | Freq: Every day | ORAL | Status: DC

## 2010-08-14 ENCOUNTER — Encounter (HOSPITAL_BASED_OUTPATIENT_CLINIC_OR_DEPARTMENT_OTHER): Payer: Medicare Other | Admitting: Hematology and Oncology

## 2010-08-14 DIAGNOSIS — E538 Deficiency of other specified B group vitamins: Secondary | ICD-10-CM

## 2010-08-23 NOTE — Op Note (Signed)
  NAMEMarland Kitchen  EERO, [name] NO.:  [id number]  MEDICAL RECORD NO.:  [id number]  LOCATION:                                 FACILITY:  PHYSICIAN:  Chucky May, M.D.  DATE OF BIRTH:  Jun 17, 1925  DATE OF PROCEDURE: DATE OF DISCHARGE:                              OPERATIVE REPORT   PREOPERATIVE DIAGNOSIS:  Cataract, left eye.  POSTOPERATIVE DIAGNOSIS:  Cataract, left eye.  OPERATION PERFORMED:  Cataract extraction with intraocular lens implant, left eye.  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  On examination, he was found to have cataract consistent with decrease in visual acuity.  ANESTHESIA:  MAC.  The outpatient setting is the appropriate setting for the procedure.  DESCRIPTION OF PROCEDURE:  The patient was brought to the main operating room and placed in the supine position.  Anesthesia was obtained by means of topical 4% lidocaine drops with tetracaine.  He was then prepped and draped in the usual manner.  A lid speculum was inserted and the cornea was entered with a keratome temporally with an additional port inferiorly.  Viscoelastic was instilled and an anterior capsulorrhexis was performed without difficulty.  The nucleus was then mobilized by hydrodissection with 1% nonpreserved lidocaine.  The nucleus was then phacoemulsified and residual cortical material was removed by irrigation and aspiration.  The posterior capsule was polished and a posterior chamber lens implant was placed in the bag without difficulty.  The wounds were hydrated and checked for fluid leaks.  Because there was some fluid leak temporally despite hydration, a single 10-0 nylon stitch was placed.  The wound was again checked for fluid leaks and none were noted.  The eye was checked and found to have a normal pressure by digital exam.  The eye was dressed with  topical Vigamox, Pred Forte and a Fox shield; and the patient was taken to the recovery room in excellent condition where he received written and verbal instructions for his postoperative care and was scheduled for followup in the office.          ______________________________ Chucky May, M.D.     DJD/MEDQ  D:  08/07/2010  T:  08/07/2010  Job:  [id number]  Electronically Signed by Nelson Chimes M.D. on 08/23/2010 09:33:47 AM

## 2010-08-29 ENCOUNTER — Other Ambulatory Visit: Payer: Self-pay | Admitting: *Deleted

## 2010-08-29 MED ORDER — ROSUVASTATIN CALCIUM 20 MG PO TABS: 20.0000 mg | ORAL_TABLET | Freq: Every day | ORAL | Status: DC

## 2010-08-29 MED ORDER — LOSARTAN POTASSIUM 25 MG PO TABS: 25.0000 mg | ORAL_TABLET | Freq: Every day | ORAL | Status: DC

## 2010-09-11 ENCOUNTER — Encounter (HOSPITAL_BASED_OUTPATIENT_CLINIC_OR_DEPARTMENT_OTHER): Payer: Medicare Other | Admitting: Hematology and Oncology

## 2010-09-11 ENCOUNTER — Other Ambulatory Visit: Payer: Self-pay | Admitting: Hematology and Oncology

## 2010-09-11 DIAGNOSIS — D638 Anemia in other chronic diseases classified elsewhere: Secondary | ICD-10-CM

## 2010-09-11 DIAGNOSIS — D563 Thalassemia minor: Secondary | ICD-10-CM

## 2010-09-11 DIAGNOSIS — D649 Anemia, unspecified: Secondary | ICD-10-CM

## 2010-09-11 DIAGNOSIS — D539 Nutritional anemia, unspecified: Secondary | ICD-10-CM

## 2010-09-11 DIAGNOSIS — E538 Deficiency of other specified B group vitamins: Secondary | ICD-10-CM

## 2010-09-11 LAB — CBC WITH DIFFERENTIAL/PLATELET
BASO%: 0.4 % (ref 0.0–2.0)
Basophils Absolute: 0 10*3/uL (ref 0.0–0.1)
EOS%: 2.6 % (ref 0.0–7.0)
HCT: 31.7 % — ABNORMAL LOW (ref 38.4–49.9)
HGB: 10.3 g/dL — ABNORMAL LOW (ref 13.0–17.1)
LYMPH%: 13.8 % — ABNORMAL LOW (ref 14.0–49.0)
MCH: 21.9 pg — ABNORMAL LOW (ref 27.2–33.4)
MCHC: 32.4 g/dL (ref 32.0–36.0)
MONO#: 0.4 10*3/uL (ref 0.1–0.9)
NEUT%: 77.8 % — ABNORMAL HIGH (ref 39.0–75.0)
Platelets: 108 10*3/uL — ABNORMAL LOW (ref 140–400)
lymph#: 1 10*3/uL (ref 0.9–3.3)

## 2010-09-13 LAB — PROTEIN ELECTROPHORESIS, SERUM
Alpha-2-Globulin: 10.2 % (ref 7.1–11.8)
Beta 2: 13.8 % — ABNORMAL HIGH (ref 3.2–6.5)
Beta Globulin: 5.6 % (ref 4.7–7.2)
Gamma Globulin: 5.5 % — ABNORMAL LOW (ref 11.1–18.8)
M-Spike, %: 0.51 g/dL

## 2010-09-13 LAB — COMPREHENSIVE METABOLIC PANEL
ALT: 14 U/L (ref 0–53)
Alkaline Phosphatase: 42 U/L (ref 39–117)
Potassium: 4.3 mEq/L (ref 3.5–5.3)
Sodium: 142 mEq/L (ref 135–145)
Total Bilirubin: 0.6 mg/dL (ref 0.3–1.2)
Total Protein: 6.7 g/dL (ref 6.0–8.3)

## 2010-09-13 LAB — KAPPA/LAMBDA LIGHT CHAINS
Kappa:Lambda Ratio: 4.53 — ABNORMAL HIGH (ref 0.26–1.65)
Lambda Free Lght Chn: 1.04 mg/dL (ref 0.57–2.63)

## 2010-10-09 ENCOUNTER — Ambulatory Visit (INDEPENDENT_AMBULATORY_CARE_PROVIDER_SITE_OTHER): Payer: Medicare Other | Admitting: *Deleted

## 2010-10-09 ENCOUNTER — Ambulatory Visit (INDEPENDENT_AMBULATORY_CARE_PROVIDER_SITE_OTHER): Payer: Medicare Other | Admitting: Cardiovascular Disease

## 2010-10-09 ENCOUNTER — Encounter: Payer: Self-pay | Admitting: Internal Medicine

## 2010-10-09 ENCOUNTER — Encounter: Payer: Self-pay | Admitting: Cardiovascular Disease

## 2010-10-09 DIAGNOSIS — J449 Chronic obstructive pulmonary disease, unspecified: Secondary | ICD-10-CM

## 2010-10-09 DIAGNOSIS — E785 Hyperlipidemia, unspecified: Secondary | ICD-10-CM

## 2010-10-09 DIAGNOSIS — J4489 Other specified chronic obstructive pulmonary disease: Secondary | ICD-10-CM

## 2010-10-09 DIAGNOSIS — I2510 Atherosclerotic heart disease of native coronary artery without angina pectoris: Secondary | ICD-10-CM

## 2010-10-09 DIAGNOSIS — I509 Heart failure, unspecified: Secondary | ICD-10-CM

## 2010-10-09 DIAGNOSIS — I469 Cardiac arrest, cause unspecified: Secondary | ICD-10-CM

## 2010-10-09 LAB — ICD DEVICE OBSERVATION
AL AMPLITUDE: 3.6 mv
CHARGE TIME: 8.5 s
DEVICE MODEL ICD: 623396
HV IMPEDENCE: 72 Ohm
MODE SWITCH EPISODES: 0
PACEART VT: 0
RV LEAD THRESHOLD: 1 V
TOT-0007: 3
TOT-0010: 4
TZON-0005SLOWVT: 6
VF: 0

## 2010-10-09 NOTE — Assessment & Plan Note (Signed)
Cholesterol is at goal.  Continue current dose of statin and diet Rx.  No myalgias or side effects.  F/U  LFT's in 6 months. No results found for this basename: LDLCALC             

## 2010-10-09 NOTE — Assessment & Plan Note (Signed)
Euvolemic off diuretic  Weight stable around 190  Check CoreVue impedence today.

## 2010-10-09 NOTE — Assessment & Plan Note (Signed)
Mild wheezing today.  Significant COPD.  F/U primary Continue inhalers

## 2010-10-09 NOTE — Progress Notes (Signed)
[name] [name] is seen today after his AICD implant. He is a sudden death survivor with 3 stents. EF by MRI is 31% with a large scar. 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 He still has good quality of life and family support. He has returned to work. He has stopped smoking since his arrest . He denies SSCP, , or syncope.l Device has not fired Wound healed well. Compliant with meds and no SSCP. His grandaughter plays softball with my daughter and I get to see [name] [name] at the ballfield. He looks good.  Has SOB but it is more pulmonary.  BNP in June was normal and CXR showed no CHF.  Has CoreVue with his device. Unfortunatly Mr [name] did not have this feature turned on. We will activate it and track it since his dypnea is more likely COPD but certainly is at risk for CHF   05/03/09 Stent to Ramus, circ and RCA Given number of stents will stay on Plavix indefinately   ROS: Denies fever, malais, weight loss, blurry vision, decreased visual acuity, cough, sputum, SOB, hemoptysis, pleuritic pain, palpitaitons, heartburn, abdominal pain, melena, lower extremity edema, claudication, or rash.  All other systems reviewed and negative  General: Affect appropriate Chronically ill on oxygen HEENT: normal Neck supple with no adenopathy JVP normal no bruits no thyromegaly Lungs clear with mild  wheezing and good diaphragmatic motion Heart:  S1/S2 no murmur,rub, gallop or click PMI normal Abdomen: benighn, BS positve, no tenderness, no AAA no bruit.  No HSM or HJR Distal pulses intact with no bruits Plus one bilateral  edema Neuro non-focal Skin warm and dry No muscular weakness   Current Outpatient Prescriptions  Medication Sig Dispense Refill  . albuterol (PROVENTIL) (2.5 MG/3ML) 0.083% nebulizer solution Take 2.5 mg by nebulization every 6 (six) hours as needed.        . ALPRAZolam (XANAX) 0.25 MG tablet Take 0.25 mg by mouth at bedtime as needed.        Marland Kitchen aspirin 81 MG tablet  Take 81 mg by mouth daily.        . clopidogrel (PLAVIX) 75 MG tablet Take 1 tablet (75 mg total) by mouth daily.  30 tablet  12  . dextromethorphan-guaiFENesin (MUCINEX DM) 30-600 MG per 12 hr tablet Take 2 tablets by mouth every 12 (twelve) hours.        . fish oil-omega-3 fatty acids 1000 MG capsule Take 2 g by mouth daily.        Marland Kitchen losartan (COZAAR) 25 MG tablet Take 1 tablet (25 mg total) by mouth daily.  30 tablet  3  . metoprolol tartrate (LOPRESSOR) 25 MG tablet 25 mg. 1/2 po bid       . rosuvastatin (CRESTOR) 20 MG tablet Take 1 tablet (20 mg total) by mouth daily.  30 tablet  6  . Tamsulosin HCl (FLOMAX) 0.4 MG CAPS Take 0.4 mg by mouth daily.       Marland Kitchen tiotropium (SPIRIVA) 18 MCG inhalation capsule Place 18 mcg into inhaler and inhale daily.          Allergies  Review of patient's allergies indicates no known allergies.  Electrocardiogram:  Assessment and Plan

## 2010-10-09 NOTE — Progress Notes (Signed)
icd check in clinic  

## 2010-10-09 NOTE — Assessment & Plan Note (Signed)
Stable with no angina and good activity level.  Continue medical Rx  

## 2010-10-09 NOTE — Assessment & Plan Note (Signed)
NO AICD D/C  St Jude device to be interogated today.

## 2010-10-09 NOTE — Patient Instructions (Signed)
Your physician recommends that you schedule a follow-up appointment in:  3 MONTHS WITH  DR NISHAN  Your physician recommends that you continue on your current medications as directed. Please refer to the Current Medication list given to you today.  

## 2010-10-22 ENCOUNTER — Encounter: Payer: Self-pay | Admitting: *Deleted

## 2010-10-25 ENCOUNTER — Encounter (HOSPITAL_BASED_OUTPATIENT_CLINIC_OR_DEPARTMENT_OTHER): Payer: Medicare Other | Admitting: Hematology and Oncology

## 2010-10-25 DIAGNOSIS — E538 Deficiency of other specified B group vitamins: Secondary | ICD-10-CM

## 2010-12-03 ENCOUNTER — Other Ambulatory Visit: Payer: Self-pay | Admitting: *Deleted

## 2010-12-03 MED ORDER — LOSARTAN POTASSIUM 25 MG PO TABS: 25.0000 mg | ORAL_TABLET | Freq: Every day | ORAL | Status: DC

## 2010-12-19 ENCOUNTER — Other Ambulatory Visit: Payer: Self-pay | Admitting: Hematology and Oncology

## 2010-12-19 DIAGNOSIS — E538 Deficiency of other specified B group vitamins: Secondary | ICD-10-CM

## 2010-12-20 ENCOUNTER — Ambulatory Visit: Payer: Medicare Other

## 2010-12-27 ENCOUNTER — Telehealth: Payer: Self-pay | Admitting: Hematology and Oncology

## 2010-12-27 NOTE — Telephone Encounter (Signed)
Added appts for jan/feb 2013. S/w pt today re next appt for 12/21. Pt is aware 2013 appts are ready and will get schedule when he comes in 12/21.

## 2011-01-08 ENCOUNTER — Encounter: Payer: Self-pay | Admitting: Cardiovascular Disease

## 2011-01-08 ENCOUNTER — Ambulatory Visit (INDEPENDENT_AMBULATORY_CARE_PROVIDER_SITE_OTHER): Payer: Medicare Other | Admitting: Cardiovascular Disease

## 2011-01-08 ENCOUNTER — Encounter: Payer: Medicare Other | Admitting: *Deleted

## 2011-01-08 DIAGNOSIS — I509 Heart failure, unspecified: Secondary | ICD-10-CM

## 2011-01-08 DIAGNOSIS — I469 Cardiac arrest, cause unspecified: Secondary | ICD-10-CM

## 2011-01-08 DIAGNOSIS — I2510 Atherosclerotic heart disease of native coronary artery without angina pectoris: Secondary | ICD-10-CM

## 2011-01-08 DIAGNOSIS — E785 Hyperlipidemia, unspecified: Secondary | ICD-10-CM

## 2011-01-08 NOTE — Assessment & Plan Note (Signed)
Stable with no angina and good activity level.  Continue medical Rx Continue ASA and Plavix 

## 2011-01-08 NOTE — Assessment & Plan Note (Signed)
Cholesterol is at goal.  Continue current dose of statin and diet Rx.  No myalgias or side effects.  F/U  LFT's in 6 months. No results found for this basename: LDLCALC             

## 2011-01-08 NOTE — Assessment & Plan Note (Signed)
Euvolemic. Continue current meds 

## 2011-01-08 NOTE — Progress Notes (Signed)
[name] [name] is seen today after his AICD implant. He is a sudden death survivor with 3 stents. EF by MRI is 31% with a large scar. His MI/Arrest occurred while he was activelly working as a [name] [name]. He still has good quality of life and family support. He has returned to work. He has stopped smoking since his arrest . He denies SSCP, , or syncope.l Device has not fired Wound healed well. Compliant with meds and no SSCP. His grandaughter plays softball with my daughter and I get to see [name] [name]. He looks good. Has SOB but it is more pulmonary. BNP in June was normal and CXR showed no CHF. Has CoreVue with his device. Unfortunatly [name] [name] did not have this feature turned on. We will activate it and track it since his dypnea is more likely COPD but certainly is at risk for CHF  05/03/09 Stent to Ramus, circ and RCA Given number of stents will stay on Plavix indefinately    ROS: Denies fever, malais, weight loss, blurry vision, decreased visual acuity, cough, sputum, SOB, hemoptysis, pleuritic pain, palpitaitons, heartburn, abdominal pain, melena, lower extremity edema, claudication, or rash.  All other systems reviewed and negative  General: Affect appropriate Healthy:  appears stated age HEENT: normal Neck supple with no adenopathy JVP normal no bruits no thyromegaly Lungs clear with no wheezing and good diaphragmatic motion Heart:  S1/S2 no murmur,rub, gallop or click PMI normal Abdomen: benighn, BS positve, no tenderness, no AAA no bruit.  No HSM or HJR Distal pulses intact with no bruits No edema Neuro non-focal Skin warm and dry No muscular weakness   Current Outpatient Prescriptions  Medication Sig Dispense Refill  . albuterol (PROVENTIL) (2.5 MG/3ML) 0.083% nebulizer solution Take 2.5 mg by nebulization every 6 (six) hours as needed.        . ALPRAZolam (XANAX) 0.25 MG tablet Take 0.25 mg by mouth at bedtime as needed.        Marland Kitchen aspirin 81 MG tablet Take 81 mg by mouth  daily.        . clopidogrel (PLAVIX) 75 MG tablet Take 1 tablet (75 mg total) by mouth daily.  30 tablet  12  . dextromethorphan-guaiFENesin (MUCINEX DM) 30-600 MG per 12 hr tablet Take 2 tablets by mouth every 12 (twelve) hours.        . fish oil-omega-3 fatty acids 1000 MG capsule Take 2 g by mouth daily.        Marland Kitchen losartan (COZAAR) 25 MG tablet Take 1 tablet (25 mg total) by mouth daily.  30 tablet  12  . metoprolol tartrate (LOPRESSOR) 25 MG tablet 1/2 po bid      . rosuvastatin (CRESTOR) 20 MG tablet Take 1 tablet (20 mg total) by mouth daily.  30 tablet  6  . Tamsulosin HCl (FLOMAX) 0.4 MG CAPS Take 0.4 mg by mouth daily.       Marland Kitchen tiotropium (SPIRIVA) 18 MCG inhalation capsule Place 18 mcg into inhaler and inhale daily.          Allergies  Review of patient's allergies indicates no known allergies.  Electrocardiogram:  SR rate 64  PAC nonspecific ST/T wave changes  Assessment and Plan

## 2011-01-08 NOTE — Assessment & Plan Note (Signed)
AICD with no D/C  F/U EPS

## 2011-01-08 NOTE — Patient Instructions (Signed)
Your physician recommends that you schedule a follow-up appointment in: 6 months  

## 2011-01-09 ENCOUNTER — Telehealth: Payer: Self-pay | Admitting: Cardiovascular Disease

## 2011-01-09 NOTE — Telephone Encounter (Signed)
Fu call °Pt was returning your call °

## 2011-01-09 NOTE — Telephone Encounter (Signed)
FU Call: Pt returning call regarding scheduling additional testing. Please return pt call to discuss further.

## 2011-01-13 NOTE — Telephone Encounter (Signed)
Spoke with pt wife, she reports his missed his check. Wife made aware of aoppt in march with dr allred. Will have the device clinic call the pt if something needs to be done prior to appt in march

## 2011-01-14 NOTE — Telephone Encounter (Signed)
Patient needs office visit with the device clinic.  I will forward this to Glynda Jaeger to be scheduled.

## 2011-01-17 ENCOUNTER — Ambulatory Visit: Payer: Medicare Other

## 2011-01-28 ENCOUNTER — Other Ambulatory Visit: Payer: Self-pay | Admitting: Hematology and Oncology

## 2011-01-29 ENCOUNTER — Ambulatory Visit (INDEPENDENT_AMBULATORY_CARE_PROVIDER_SITE_OTHER): Payer: Medicare Other | Admitting: *Deleted

## 2011-01-29 ENCOUNTER — Ambulatory Visit (HOSPITAL_BASED_OUTPATIENT_CLINIC_OR_DEPARTMENT_OTHER): Payer: Medicare Other

## 2011-01-29 ENCOUNTER — Encounter: Payer: Self-pay | Admitting: Internal Medicine

## 2011-01-29 VITALS — BP 123/60 | HR 61 | Temp 98.5°F

## 2011-01-29 DIAGNOSIS — I469 Cardiac arrest, cause unspecified: Secondary | ICD-10-CM

## 2011-01-29 DIAGNOSIS — I509 Heart failure, unspecified: Secondary | ICD-10-CM | POA: Diagnosis not present

## 2011-01-29 DIAGNOSIS — E538 Deficiency of other specified B group vitamins: Secondary | ICD-10-CM | POA: Diagnosis not present

## 2011-01-29 LAB — ICD DEVICE OBSERVATION
AL AMPLITUDE: 3.3 mv
AL IMPEDENCE ICD: 362.5 Ohm
BAMS-0001: 150 {beats}/min
CHARGE TIME: 8.9 s
DEV-0020ICD: NEGATIVE
MODE SWITCH EPISODES: 0
RV LEAD THRESHOLD: 1 V
TOT-0006: 20111117000000
TOT-0007: 3
TOT-0008: 0
TZON-0003SLOWVT: 335 ms
TZON-0004SLOWVT: 30
TZON-0005SLOWVT: 6
VENTRICULAR PACING ICD: 0.03 pct
VF: 0

## 2011-01-29 MED ORDER — CYANOCOBALAMIN 1000 MCG/ML IJ SOLN
1000.0000 ug | Freq: Once | INTRAMUSCULAR | Status: AC
  Administered 2011-01-29: 1000 ug via SUBCUTANEOUS

## 2011-01-29 NOTE — Progress Notes (Signed)
icd check in clinic  

## 2011-02-05 DIAGNOSIS — R350 Frequency of micturition: Secondary | ICD-10-CM | POA: Diagnosis not present

## 2011-02-05 DIAGNOSIS — R339 Retention of urine, unspecified: Secondary | ICD-10-CM | POA: Diagnosis not present

## 2011-02-05 DIAGNOSIS — N401 Enlarged prostate with lower urinary tract symptoms: Secondary | ICD-10-CM | POA: Diagnosis not present

## 2011-02-05 DIAGNOSIS — R39198 Other difficulties with micturition: Secondary | ICD-10-CM | POA: Diagnosis not present

## 2011-02-14 ENCOUNTER — Ambulatory Visit: Payer: Medicare Other

## 2011-02-14 NOTE — Progress Notes (Signed)
Received call from daughter  [name] [name]  Stating that pt has appt for injection today.   Pt could not come due to bad weather and would need to reschedule.   Pt already had  B12 injection on 01/29/11.   Confirmed appts for lab and f/u with md with [name] [name],  And informed her that pt will reeive  Injection at his f/u visit.   [name] [name] voiced understanding. [name]    Phone      [phone].

## 2011-03-07 ENCOUNTER — Other Ambulatory Visit (HOSPITAL_BASED_OUTPATIENT_CLINIC_OR_DEPARTMENT_OTHER): Payer: Medicare Other

## 2011-03-07 DIAGNOSIS — N401 Enlarged prostate with lower urinary tract symptoms: Secondary | ICD-10-CM | POA: Diagnosis not present

## 2011-03-07 DIAGNOSIS — D539 Nutritional anemia, unspecified: Secondary | ICD-10-CM | POA: Diagnosis not present

## 2011-03-07 DIAGNOSIS — E538 Deficiency of other specified B group vitamins: Secondary | ICD-10-CM | POA: Diagnosis not present

## 2011-03-07 DIAGNOSIS — R350 Frequency of micturition: Secondary | ICD-10-CM | POA: Diagnosis not present

## 2011-03-07 DIAGNOSIS — R972 Elevated prostate specific antigen [PSA]: Secondary | ICD-10-CM | POA: Diagnosis not present

## 2011-03-07 LAB — CBC WITH DIFFERENTIAL/PLATELET
EOS%: 2.2 % (ref 0.0–7.0)
Eosinophils Absolute: 0.1 10*3/uL (ref 0.0–0.5)
LYMPH%: 13 % — ABNORMAL LOW (ref 14.0–49.0)
MCH: 21.6 pg — ABNORMAL LOW (ref 27.2–33.4)
MCHC: 32.2 g/dL (ref 32.0–36.0)
MCV: 67.1 fL — ABNORMAL LOW (ref 79.3–98.0)
MONO%: 5.7 % (ref 0.0–14.0)
Platelets: 113 10*3/uL — ABNORMAL LOW (ref 140–400)
RBC: 4.8 10*6/uL (ref 4.20–5.82)
RDW: 17.7 % — ABNORMAL HIGH (ref 11.0–14.6)

## 2011-03-11 LAB — COMPREHENSIVE METABOLIC PANEL
AST: 17 U/L (ref 0–37)
Albumin: 4.1 g/dL (ref 3.5–5.2)
Alkaline Phosphatase: 46 U/L (ref 39–117)
Glucose, Bld: 86 mg/dL (ref 70–99)
Potassium: 4.4 mEq/L (ref 3.5–5.3)
Sodium: 143 mEq/L (ref 135–145)
Total Bilirubin: 0.5 mg/dL (ref 0.3–1.2)
Total Protein: 6.3 g/dL (ref 6.0–8.3)

## 2011-03-11 LAB — PROTEIN ELECTROPHORESIS, SERUM
Albumin ELP: 60.3 % (ref 55.8–66.1)
Alpha-1-Globulin: 4.7 % (ref 2.9–4.9)
Alpha-2-Globulin: 10.1 % (ref 7.1–11.8)
Beta 2: 13.2 % — ABNORMAL HIGH (ref 3.2–6.5)
Beta Globulin: 5.6 % (ref 4.7–7.2)
Gamma Globulin: 6.1 % — ABNORMAL LOW (ref 11.1–18.8)
M-Spike, %: 0.48 g/dL
Total Protein, Serum Electrophoresis: 6.3 g/dL (ref 6.0–8.3)

## 2011-03-11 LAB — FERRITIN: Ferritin: 392 ng/mL — ABNORMAL HIGH (ref 22–322)

## 2011-03-11 LAB — KAPPA/LAMBDA LIGHT CHAINS: Kappa:Lambda Ratio: 2.74 — ABNORMAL HIGH (ref 0.26–1.65)

## 2011-03-11 LAB — IRON AND TIBC
TIBC: 263 ug/dL (ref 215–435)
UIBC: 158 ug/dL (ref 125–400)

## 2011-03-11 LAB — VITAMIN B12: Vitamin B-12: 222 pg/mL (ref 211–911)

## 2011-03-14 ENCOUNTER — Ambulatory Visit (HOSPITAL_BASED_OUTPATIENT_CLINIC_OR_DEPARTMENT_OTHER): Payer: Medicare Other | Admitting: Hematology and Oncology

## 2011-03-14 ENCOUNTER — Telehealth: Payer: Self-pay | Admitting: Hematology and Oncology

## 2011-03-14 VITALS — BP 127/58 | HR 65 | Temp 97.0°F | Ht 72.0 in | Wt 189.5 lb

## 2011-03-14 DIAGNOSIS — E538 Deficiency of other specified B group vitamins: Secondary | ICD-10-CM | POA: Diagnosis not present

## 2011-03-14 DIAGNOSIS — H43819 Vitreous degeneration, unspecified eye: Secondary | ICD-10-CM | POA: Diagnosis not present

## 2011-03-14 DIAGNOSIS — H353290 Exudative age-related macular degeneration, unspecified eye, stage unspecified: Secondary | ICD-10-CM | POA: Diagnosis not present

## 2011-03-14 DIAGNOSIS — H35059 Retinal neovascularization, unspecified, unspecified eye: Secondary | ICD-10-CM | POA: Diagnosis not present

## 2011-03-14 DIAGNOSIS — D472 Monoclonal gammopathy: Secondary | ICD-10-CM

## 2011-03-14 DIAGNOSIS — H353190 Nonexudative age-related macular degeneration, unspecified eye, stage unspecified: Secondary | ICD-10-CM | POA: Diagnosis not present

## 2011-03-14 MED ORDER — CYANOCOBALAMIN 1000 MCG/ML IJ SOLN
1000.0000 ug | Freq: Once | INTRAMUSCULAR | Status: AC
  Administered 2011-03-14: 1000 ug via SUBCUTANEOUS

## 2011-03-14 NOTE — Telephone Encounter (Signed)
Gv pt appt for march-nov2013.  pt did not want to be scheduled for bone survey appt

## 2011-03-14 NOTE — Progress Notes (Signed)
This office note has been dictated.

## 2011-03-14 NOTE — Progress Notes (Signed)
CC:   [name] Housekeeper, MD [name] [name]. Eden Emms, MD, [name] [name] [name] Range, MD [name] [name], M.D.  IDENTIFYING STATEMENT:  The patient is an 78 year old man with anemia who presents for followup.  INTERVAL HISTORY:  [name] [name] has no current complaints.  He states his energy levels are fair.  He comes here every month for vitamin B12 injections.  His last B12 injection was on January 28, 2010.  MEDICATIONS:  Medications reviewed and updated.  ALLERGIES:  None.  PHYSICAL EXAMINATION:  General:  Elderly man in no distress.  Vitals: Pulse is 65, blood pressure 127/58, temperature 97, respirations 20, weight 189 pounds.  HEENT:  Head is atraumatic, normocephalic.  Sclerae anicteric.  Mouth moist.  Chest:  Clear.  Abdomen:  Soft, nontender. Bowel sounds present.  Extremities:  Plus-plus edema.  LABORATORY DATA:  03/07/2011 white cell count 6.8, hemoglobin 10.4, hematocrit 32.2, platelets 113 (108).  Sodium 143, potassium 4.4, chloride 107, CO2 is 23, BUN 35, creatinine 1.32, glucose 86, T-bili 0.5, alkaline phosphatase 46, AST 17, ALT 14, calcium 9.2.  Iron 105, TIBC 263, saturation 40%, ferritin 392 (430).  B12 was 222.  SPEP revealed a restrictive band consistent with a monoclonal protein, measures 0.48 g/dL (8.11 g/dL), kappa free light chain 2.59 (4.71), lambda free light chain 1.31, kappa/lambda ratio 2.74 (4.53).  IMPRESSIONS AND PLAN: 1. [name] [name] is an 78 year old man with a multifactorial anemia with     beta thalassemia trait and anemia of chronic disease.  He is not on     oral iron. 2. Thrombocytopenia, stable. 3. B12 deficiency.  He will continue with monthly B12 shots and I have     also asked him to supplement his diet with oral vitamin B12 dose of     1000 mcg every other day. 4. 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     Continue surveillance. The patient follows up in 9 months' time.    ______________________________ Laurice Record,  M.D. LIO/MEDQ  D:  03/14/2011  T:  03/14/2011  Job:  [id number]

## 2011-03-31 ENCOUNTER — Other Ambulatory Visit (HOSPITAL_COMMUNITY): Payer: Self-pay | Admitting: Internal Medicine

## 2011-03-31 DIAGNOSIS — N400 Enlarged prostate without lower urinary tract symptoms: Secondary | ICD-10-CM | POA: Diagnosis not present

## 2011-03-31 DIAGNOSIS — E782 Mixed hyperlipidemia: Secondary | ICD-10-CM | POA: Diagnosis not present

## 2011-03-31 DIAGNOSIS — I10 Essential (primary) hypertension: Secondary | ICD-10-CM | POA: Diagnosis not present

## 2011-03-31 DIAGNOSIS — I2510 Atherosclerotic heart disease of native coronary artery without angina pectoris: Secondary | ICD-10-CM | POA: Diagnosis not present

## 2011-03-31 DIAGNOSIS — E119 Type 2 diabetes mellitus without complications: Secondary | ICD-10-CM | POA: Diagnosis not present

## 2011-03-31 DIAGNOSIS — R972 Elevated prostate specific antigen [PSA]: Secondary | ICD-10-CM | POA: Diagnosis not present

## 2011-03-31 DIAGNOSIS — J449 Chronic obstructive pulmonary disease, unspecified: Secondary | ICD-10-CM | POA: Diagnosis not present

## 2011-03-31 DIAGNOSIS — M1990 Unspecified osteoarthritis, unspecified site: Secondary | ICD-10-CM | POA: Diagnosis not present

## 2011-04-11 ENCOUNTER — Ambulatory Visit (HOSPITAL_BASED_OUTPATIENT_CLINIC_OR_DEPARTMENT_OTHER): Payer: Medicare Other

## 2011-04-11 VITALS — BP 114/69 | HR 61 | Temp 97.1°F

## 2011-04-11 DIAGNOSIS — E538 Deficiency of other specified B group vitamins: Secondary | ICD-10-CM

## 2011-04-11 MED ORDER — CYANOCOBALAMIN 1000 MCG/ML IJ SOLN
1000.0000 ug | Freq: Once | INTRAMUSCULAR | Status: AC
  Administered 2011-04-11: 1000 ug via SUBCUTANEOUS

## 2011-04-14 ENCOUNTER — Ambulatory Visit (INDEPENDENT_AMBULATORY_CARE_PROVIDER_SITE_OTHER): Payer: Medicare Other | Admitting: Internal Medicine

## 2011-04-14 ENCOUNTER — Encounter: Payer: Self-pay | Admitting: Internal Medicine

## 2011-04-14 VITALS — BP 131/64 | HR 58 | Resp 19 | Ht 72.0 in | Wt 190.0 lb

## 2011-04-14 DIAGNOSIS — I469 Cardiac arrest, cause unspecified: Secondary | ICD-10-CM

## 2011-04-14 DIAGNOSIS — I2589 Other forms of chronic ischemic heart disease: Secondary | ICD-10-CM | POA: Diagnosis not present

## 2011-04-14 DIAGNOSIS — I509 Heart failure, unspecified: Secondary | ICD-10-CM | POA: Diagnosis not present

## 2011-04-14 DIAGNOSIS — I255 Ischemic cardiomyopathy: Secondary | ICD-10-CM

## 2011-04-14 LAB — ICD DEVICE OBSERVATION
AL THRESHOLD: 0.75 V
ATRIAL PACING ICD: 1 pct
DEVICE MODEL ICD: 623396
RV LEAD AMPLITUDE: 11.3 mv
TZON-0010SLOWVT: 40 ms
VENTRICULAR PACING ICD: 1 pct

## 2011-04-14 NOTE — Assessment & Plan Note (Signed)
Normal ICD function See Arita Miss Art report carevue is not elevated today No changes today  Merlin checks every 3 months I will see again in 1year

## 2011-04-14 NOTE — Patient Instructions (Addendum)
Remote monitoring is used to monitor your Pacemaker of ICD from home. This monitoring reduces the number of office visits required to check your device to one time per year. It allows us to keep an eye on the functioning of your device to ensure it is working properly. You are scheduled for a device check from home on July 17, 2011. You may send your transmission at any time that day. If you have a wireless device, the transmission will be sent automatically. After your physician reviews your transmission, you will receive a postcard with your next transmission date.  Your physician wants you to follow-up in: 12 months with Dr Allred You will receive a reminder letter in the mail two months in advance. If you don't receive a letter, please call our office to schedule the follow-up appointment.  

## 2011-04-14 NOTE — Progress Notes (Signed)
PCP:  [name], MD, MD Primary Cardiologist:  Dr Eden Emms  The patient presents today for routine electrophysiology followup.  Since last being seen in our clinic, the patient reports doing reasonably well.  He has stable chronic dyspnea.  Today, he denies symptoms of palpitations, chest pain, lower extremity edema, dizziness, presyncope, syncope, or ICD shocks.  The patient feels that he is tolerating medications without difficulties and is otherwise without complaint today.   Past Medical History  Diagnosis Date  . CAD (coronary artery disease)   . Ischemic cardiomyopathy   . CHF (congestive heart failure)   . Dyslipidemia   . Diverticulosis of colon (without mention of hemorrhage)   . Diabetes mellitus   . Anemia, unspecified   . Unspecified disorder resulting from impaired renal function   . Chronic airway obstruction, not elsewhere classified     on o2 at night  . Prostatic hypertrophy     hx of  . Retention of urine, unspecified   . ICD (implantable cardiac defibrillator) in place     st jude   Past Surgical History  Procedure Date  . Pci     4/11  . Cardiac defibrillator placement     st jude    Current Outpatient Prescriptions  Medication Sig Dispense Refill  . albuterol (PROVENTIL) (2.5 MG/3ML) 0.083% nebulizer solution Take 2.5 mg by nebulization every 6 (six) hours as needed.        . ALPRAZolam (XANAX) 0.25 MG tablet Take 0.25 mg by mouth at bedtime as needed.       Marland Kitchen aspirin 81 MG tablet Take 81 mg by mouth daily.        . AVODART 0.5 MG capsule Take 0.5 mg by mouth daily.       . clopidogrel (PLAVIX) 75 MG tablet Take 1 tablet (75 mg total) by mouth daily.  30 tablet  12  . Cyanocobalamin (VITAMIN B 12 PO) Inject as directed.      Marland Kitchen dextromethorphan-guaiFENesin (MUCINEX DM) 30-600 MG per 12 hr tablet Take 2 tablets by mouth every 12 (twelve) hours.        . fish oil-omega-3 fatty acids 1000 MG capsule Take 2 g by mouth daily.        Marland Kitchen losartan (COZAAR) 25 MG  tablet Take 1 tablet (25 mg total) by mouth daily.  30 tablet  12  . metoprolol tartrate (LOPRESSOR) 25 MG tablet 1/2 po bid      . rosuvastatin (CRESTOR) 20 MG tablet Take 1 tablet (20 mg total) by mouth daily.  30 tablet  6  . Tamsulosin HCl (FLOMAX) 0.4 MG CAPS Take 0.4 mg by mouth daily.       Marland Kitchen tiotropium (SPIRIVA) 18 MCG inhalation capsule Place 18 mcg into inhaler and inhale daily.          No Known Allergies  History   Social History  . Marital Status: Married    Spouse Name: N/A    Number of Children: N/A  . Years of Education: N/A   Occupational History  . dentist    Social History Main Topics  . Smoking status: Former Smoker    Quit date: 04/27/2009  . Smokeless tobacco: Not on file  . Alcohol Use: Yes  . Drug Use:   . Sexually Active:    Other Topics Concern  . Not on file   Social History Narrative  . No narrative on file    Family History  Problem Relation Age of Onset  .  Cancer      family history  . Coronary artery disease    . Stroke      Physical Exam: Filed Vitals:   04/14/11 1034  BP: 131/64  Pulse: 58  Resp: 19  Height: 6' (1.829 m)  Weight: 190 lb (86.183 kg)    GEN- The patient is well appearing, alert and oriented x 3 today.   Head- normocephalic, atraumatic Eyes-  Sclera clear, conjunctiva pink Ears- hearing intact Oropharynx- clear Neck- supple, no JVP Lymph- no cervical lymphadenopathy Lungs- Clear to ausculation bilaterally, normal work of breathing Chest- ICD pocket is well healed Heart- Regular rate and rhythm, no murmurs, rubs or gallops, PMI not laterally displaced GI- soft, NT, ND, + BS Extremities- no clubbing, cyanosis, or edemat  ICD interrogation- reviewed in detail today,  See PACEART report  Assessment and Plan:

## 2011-04-16 ENCOUNTER — Ambulatory Visit (HOSPITAL_COMMUNITY)
Admission: RE | Admit: 2011-04-16 | Discharge: 2011-04-16 | Disposition: A | Discharge status: Home / Self Care | Payer: Medicare Other | Source: Ambulatory Visit | Attending: Internal Medicine | Admitting: Internal Medicine

## 2011-04-16 DIAGNOSIS — J4489 Other specified chronic obstructive pulmonary disease: Secondary | ICD-10-CM | POA: Insufficient documentation

## 2011-04-16 DIAGNOSIS — J449 Chronic obstructive pulmonary disease, unspecified: Secondary | ICD-10-CM | POA: Insufficient documentation

## 2011-04-16 MED ORDER — ALBUTEROL SULFATE (5 MG/ML) 0.5% IN NEBU
2.5000 mg | INHALATION_SOLUTION | Freq: Once | RESPIRATORY_TRACT | Status: AC
  Administered 2011-04-16: 2.5 mg via RESPIRATORY_TRACT

## 2011-05-12 ENCOUNTER — Ambulatory Visit (HOSPITAL_BASED_OUTPATIENT_CLINIC_OR_DEPARTMENT_OTHER): Payer: Medicare Other

## 2011-05-12 VITALS — BP 119/54 | HR 53 | Temp 98.0°F

## 2011-05-12 DIAGNOSIS — E538 Deficiency of other specified B group vitamins: Secondary | ICD-10-CM

## 2011-05-12 MED ORDER — CYANOCOBALAMIN 1000 MCG/ML IJ SOLN
1000.0000 ug | Freq: Once | INTRAMUSCULAR | Status: AC
  Administered 2011-05-12: 1000 ug via SUBCUTANEOUS

## 2011-06-13 ENCOUNTER — Ambulatory Visit (HOSPITAL_BASED_OUTPATIENT_CLINIC_OR_DEPARTMENT_OTHER): Payer: Medicare Other

## 2011-06-13 VITALS — BP 142/63 | HR 61 | Temp 98.4°F

## 2011-06-13 DIAGNOSIS — E538 Deficiency of other specified B group vitamins: Secondary | ICD-10-CM | POA: Diagnosis not present

## 2011-06-13 MED ORDER — CYANOCOBALAMIN 1000 MCG/ML IJ SOLN
1000.0000 ug | Freq: Once | INTRAMUSCULAR | Status: AC
  Administered 2011-06-13: 1000 ug via SUBCUTANEOUS

## 2011-07-11 ENCOUNTER — Ambulatory Visit: Payer: Medicare Other

## 2011-07-11 ENCOUNTER — Telehealth: Payer: Self-pay | Admitting: *Deleted

## 2011-07-11 NOTE — Telephone Encounter (Signed)
Called pt about missed injection appointment.  Rescheduled to Monday 07/14/11 at 1:00PM

## 2011-07-14 ENCOUNTER — Ambulatory Visit (HOSPITAL_BASED_OUTPATIENT_CLINIC_OR_DEPARTMENT_OTHER): Payer: Medicare Other

## 2011-07-14 ENCOUNTER — Telehealth: Payer: Self-pay | Admitting: Hematology and Oncology

## 2011-07-14 VITALS — BP 102/62 | HR 78 | Temp 97.4°F

## 2011-07-14 DIAGNOSIS — E538 Deficiency of other specified B group vitamins: Secondary | ICD-10-CM | POA: Diagnosis not present

## 2011-07-14 MED ORDER — CYANOCOBALAMIN 1000 MCG/ML IJ SOLN
1000.0000 ug | Freq: Once | INTRAMUSCULAR | Status: AC
  Administered 2011-07-14: 1000 ug via INTRAMUSCULAR

## 2011-07-14 NOTE — Telephone Encounter (Signed)
Gave pt appt from July to November 2013, injections, lab and MD

## 2011-07-17 ENCOUNTER — Encounter: Payer: Medicare Other | Admitting: *Deleted

## 2011-07-17 ENCOUNTER — Ambulatory Visit (INDEPENDENT_AMBULATORY_CARE_PROVIDER_SITE_OTHER): Payer: Medicare Other | Admitting: *Deleted

## 2011-07-17 ENCOUNTER — Encounter: Payer: Self-pay | Admitting: Internal Medicine

## 2011-07-17 DIAGNOSIS — I469 Cardiac arrest, cause unspecified: Secondary | ICD-10-CM | POA: Diagnosis not present

## 2011-07-17 DIAGNOSIS — I509 Heart failure, unspecified: Secondary | ICD-10-CM | POA: Diagnosis not present

## 2011-07-17 LAB — ICD DEVICE OBSERVATION
AL IMPEDENCE ICD: 387.5 Ohm
ATRIAL PACING ICD: 0.09 pct
RV LEAD IMPEDENCE ICD: 412.5 Ohm
TOT-0006: 20111117000000
TOT-0007: 3
TOT-0008: 0
TOT-0009: 3
TZON-0003SLOWVT: 335 ms
TZON-0004SLOWVT: 30
TZON-0010SLOWVT: 40 ms

## 2011-07-17 NOTE — Progress Notes (Signed)
defib check in clinic  

## 2011-07-18 DIAGNOSIS — H43819 Vitreous degeneration, unspecified eye: Secondary | ICD-10-CM | POA: Diagnosis not present

## 2011-07-18 DIAGNOSIS — H35059 Retinal neovascularization, unspecified, unspecified eye: Secondary | ICD-10-CM | POA: Diagnosis not present

## 2011-07-18 DIAGNOSIS — H353190 Nonexudative age-related macular degeneration, unspecified eye, stage unspecified: Secondary | ICD-10-CM | POA: Diagnosis not present

## 2011-07-18 DIAGNOSIS — H353290 Exudative age-related macular degeneration, unspecified eye, stage unspecified: Secondary | ICD-10-CM | POA: Diagnosis not present

## 2011-07-28 ENCOUNTER — Encounter: Payer: Self-pay | Admitting: *Deleted

## 2011-08-08 ENCOUNTER — Telehealth: Payer: Self-pay | Admitting: *Deleted

## 2011-08-08 NOTE — Telephone Encounter (Signed)
Received call from daughter  Judeth Cornfield requesting pt's appt for injection on 09-07-2011  To be changed due to death in the family.    Gave pt new date and time for injection on 08/13/11 at 0945 am.   Judeth Cornfield voiced understanding.

## 2011-08-11 ENCOUNTER — Ambulatory Visit: Payer: Medicare Other

## 2011-08-13 ENCOUNTER — Ambulatory Visit (HOSPITAL_BASED_OUTPATIENT_CLINIC_OR_DEPARTMENT_OTHER): Payer: Medicare Other

## 2011-08-13 VITALS — BP 95/63 | HR 76 | Temp 97.4°F

## 2011-08-13 DIAGNOSIS — E538 Deficiency of other specified B group vitamins: Secondary | ICD-10-CM

## 2011-08-13 MED ORDER — CYANOCOBALAMIN 1000 MCG/ML IJ SOLN
1000.0000 ug | Freq: Once | INTRAMUSCULAR | Status: AC
  Administered 2011-08-13: 1000 ug via SUBCUTANEOUS

## 2011-08-15 ENCOUNTER — Other Ambulatory Visit: Payer: Self-pay | Admitting: *Deleted

## 2011-08-15 DIAGNOSIS — J449 Chronic obstructive pulmonary disease, unspecified: Secondary | ICD-10-CM | POA: Diagnosis not present

## 2011-08-15 DIAGNOSIS — E538 Deficiency of other specified B group vitamins: Secondary | ICD-10-CM | POA: Diagnosis not present

## 2011-08-15 DIAGNOSIS — H9209 Otalgia, unspecified ear: Secondary | ICD-10-CM | POA: Diagnosis not present

## 2011-08-15 DIAGNOSIS — E782 Mixed hyperlipidemia: Secondary | ICD-10-CM | POA: Diagnosis not present

## 2011-08-15 DIAGNOSIS — N400 Enlarged prostate without lower urinary tract symptoms: Secondary | ICD-10-CM | POA: Diagnosis not present

## 2011-08-15 DIAGNOSIS — I10 Essential (primary) hypertension: Secondary | ICD-10-CM | POA: Diagnosis not present

## 2011-08-15 MED ORDER — CLOPIDOGREL BISULFATE 75 MG PO TABS: 75.0000 mg | ORAL_TABLET | Freq: Every day | ORAL | Status: DC

## 2011-08-15 MED ORDER — ROSUVASTATIN CALCIUM 20 MG PO TABS: 20.0000 mg | ORAL_TABLET | Freq: Every day | ORAL | Status: DC

## 2011-09-12 ENCOUNTER — Ambulatory Visit (HOSPITAL_BASED_OUTPATIENT_CLINIC_OR_DEPARTMENT_OTHER): Payer: BLUE CROSS/BLUE SHIELD

## 2011-09-12 VITALS — BP 130/74 | HR 95 | Temp 97.9°F | Resp 24

## 2011-09-12 DIAGNOSIS — E538 Deficiency of other specified B group vitamins: Secondary | ICD-10-CM | POA: Diagnosis not present

## 2011-09-12 MED ORDER — CYANOCOBALAMIN 1000 MCG/ML IJ SOLN
1000.0000 ug | Freq: Once | INTRAMUSCULAR | Status: AC
  Administered 2011-09-12: 1000 ug via SUBCUTANEOUS

## 2011-09-15 ENCOUNTER — Encounter: Payer: Self-pay | Admitting: Internal Medicine

## 2011-09-15 ENCOUNTER — Encounter: Payer: Self-pay | Admitting: Cardiovascular Disease

## 2011-09-15 ENCOUNTER — Ambulatory Visit (INDEPENDENT_AMBULATORY_CARE_PROVIDER_SITE_OTHER): Payer: Medicare Other | Admitting: *Deleted

## 2011-09-15 ENCOUNTER — Ambulatory Visit (INDEPENDENT_AMBULATORY_CARE_PROVIDER_SITE_OTHER): Payer: Medicare Other | Admitting: Cardiovascular Disease

## 2011-09-15 VITALS — BP 128/76 | HR 70 | Resp 14

## 2011-09-15 DIAGNOSIS — R0600 Dyspnea, unspecified: Secondary | ICD-10-CM

## 2011-09-15 DIAGNOSIS — I255 Ischemic cardiomyopathy: Secondary | ICD-10-CM

## 2011-09-15 DIAGNOSIS — J449 Chronic obstructive pulmonary disease, unspecified: Secondary | ICD-10-CM | POA: Diagnosis not present

## 2011-09-15 DIAGNOSIS — I2510 Atherosclerotic heart disease of native coronary artery without angina pectoris: Secondary | ICD-10-CM | POA: Diagnosis not present

## 2011-09-15 DIAGNOSIS — E785 Hyperlipidemia, unspecified: Secondary | ICD-10-CM

## 2011-09-15 DIAGNOSIS — I2589 Other forms of chronic ischemic heart disease: Secondary | ICD-10-CM

## 2011-09-15 DIAGNOSIS — I469 Cardiac arrest, cause unspecified: Secondary | ICD-10-CM | POA: Diagnosis not present

## 2011-09-15 DIAGNOSIS — E119 Type 2 diabetes mellitus without complications: Secondary | ICD-10-CM | POA: Diagnosis not present

## 2011-09-15 DIAGNOSIS — I509 Heart failure, unspecified: Secondary | ICD-10-CM

## 2011-09-15 DIAGNOSIS — R0989 Other specified symptoms and signs involving the circulatory and respiratory systems: Secondary | ICD-10-CM

## 2011-09-15 MED ORDER — NITROGLYCERIN 0.4 MG SL SUBL: 0.4000 mg | SUBLINGUAL_TABLET | SUBLINGUAL | Status: DC | PRN

## 2011-09-15 NOTE — Assessment & Plan Note (Signed)
AICD to be check today NO D/C  CoreVue should be ok as clinically dyspnea is pulmonary

## 2011-09-15 NOTE — Assessment & Plan Note (Signed)
Cholesterol is at goal.  Continue current dose of statin and diet Rx.  No myalgias or side effects.  F/U  LFT's in 6 months. No results found for this basename: LDLCALC             

## 2011-09-15 NOTE — Assessment & Plan Note (Signed)
Will refer to pulmonary for COPD as I believe his breathing is symptomatic.  May need pulsed steroids

## 2011-09-15 NOTE — Assessment & Plan Note (Signed)
Stable with no angina and good activity level.  Continue medical Rx  

## 2011-09-15 NOTE — Patient Instructions (Addendum)
Your physician recommends that you schedule a follow-up appointment in: 3 MONTHS WITH DR Eden Emms AND KRISTIN  AND PAULA Your physician recommends that you continue on your current medications as directed. Please refer to the Current Medication list given to you today. You have been referred to PULMONARY FOR DX COPD    SEPT  4 TH 2013 AT 3:30 PM  DR ZOXW  Nitroglycerin sublingual tablets What is this medicine? NITROGLYCERIN (nye troe GLI ser in) is a type of vasodilator. It relaxes blood vessels, increasing the blood and oxygen supply to your heart. This medicine is used to relieve chest pain caused by angina. It is also used to prevent chest pain before activities like climbing stairs, going outdoors in cold weather, or sexual activity. This medicine may be used for other purposes; ask your health care provider or pharmacist if you have questions. What should I tell my health care provider before I take this medicine? They need to know if you have any of these conditions: -anemia -head injury, recent stroke, or bleeding in the brain -liver disease -previous heart attack -an unusual or allergic reaction to nitroglycerin, other medicines, foods, dyes, or preservatives -pregnant or trying to get pregnant -breast-feeding How should I use this medicine? Take this medicine by mouth as needed. At the first sign of an angina attack (chest pain or tightness) place one tablet under your tongue. You can also take this medicine 5 to 10 minutes before an event likely to produce chest pain. Follow the directions on the prescription label. Let the tablet dissolve under the tongue. Do not swallow whole. Replace the dose if you accidentally swallow it. It will help if your mouth is not dry. Saliva around the tablet will help it to dissolve more quickly. Do not eat or drink, smoke or chew tobacco while a tablet is dissolving. If you are not better within 5 minutes after taking ONE dose of nitroglycerin, call 9-1-1  immediately to seek emergency medical care. Do not take more than 3 nitroglycerin tablets over 15 minutes. If you take this medicine often to relieve symptoms of angina, your doctor or health care professional may provide you with different instructions to manage your symptoms. If symptoms do not go away after following these instructions, it is important to call 9-1-1 immediately. Do not take more than 3 nitroglycerin tablets over 15 minutes. Talk to your pediatrician regarding the use of this medicine in children. Special care may be needed. Overdosage: If you think you have taken too much of this medicine contact a poison control center or emergency room at once. NOTE: This medicine is only for you. Do not share this medicine with others. What if I miss a dose? This does not apply. This medicine is only used as needed. What may interact with this medicine? Do not take this medicine with any of the following medications: -certain migraine medicines like ergotamine and dihydroergotamine (DHE) -medicines used to treat erectile dysfunction like sildenafil, tadalafil, and vardenafil This medicine may also interact with the following medications: -alteplase -aspirin -heparin -medicines for high blood pressure -medicines for mental depression -other medicines used to treat angina -phenothiazines like chlorpromazine, mesoridazine, prochlorperazine, thioridazine This list may not describe all possible interactions. Give your health care provider a list of all the medicines, herbs, non-prescription drugs, or dietary supplements you use. Also tell them if you smoke, drink alcohol, or use illegal drugs. Some items may interact with your medicine. What should I watch for while using this medicine?  Tell your doctor or health care professional if you feel your medicine is no longer working. Keep this medicine with you at all times. Sit or lie down when you take your medicine to prevent falling if you feel  dizzy or faint after using it. Try to remain calm. This will help you to feel better faster. If you feel dizzy, take several deep breaths and lie down with your feet propped up, or bend forward with your head resting between your knees. You may get drowsy or dizzy. Do not drive, use machinery, or do anything that needs mental alertness until you know how this drug affects you. Do not stand or sit up quickly, especially if you are an older patient. This reduces the risk of dizzy or fainting spells. Alcohol can make you more drowsy and dizzy. Avoid alcoholic drinks. Do not treat yourself for coughs, colds, or pain while you are taking this medicine without asking your doctor or health care professional for advice. Some ingredients may increase your blood pressure. What side effects may I notice from receiving this medicine? Side effects that you should report to your doctor or health care professional as soon as possible: -blurred vision -dry mouth -skin rash -sweating -the feeling of extreme pressure in the head -unusually weak or tired Side effects that usually do not require medical attention (report to your doctor or health care professional if they continue or are bothersome): -flushing of the face or neck -headache -irregular heartbeat, palpitations -nausea, vomiting This list may not describe all possible side effects. Call your doctor for medical advice about side effects. You may report side effects to FDA at 1-800-FDA-1088. Where should I keep my medicine? Keep out of the reach of children. Store at room temperature between 20 and 25 degrees C (68 and 77 degrees F). Store in Retail buyer. Protect from light and moisture. Keep tightly closed. Throw away any unused medicine after the expiration date. NOTE: This sheet is a summary. It may not cover all possible information. If you have questions about this medicine, talk to your doctor, pharmacist, or health care provider.  2012,  [employer]vier/Gold Standard. (08/06/2007 5:16:24 PM)

## 2011-09-15 NOTE — Assessment & Plan Note (Signed)
Reviewed Corevue data with Belenda Cruise from [employer]  Impedence stable with no signs of CHF  Refer to pulmonary  Encouraged use of inhaler

## 2011-09-15 NOTE — Progress Notes (Signed)
Patient ID: [name] [name], male   DOB: [birth year], 78 y.o.   MRN: [id number] [name] [name] is seen today after his AICD implant. He is a sudden death survivor with 3 stents. EF by MRI is 31% with a large scar. 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 He still has good quality of life and family support. He has returned to work. He has stopped smoking since his arrest . He denies SSCP, , or syncope.l Device has not fired Wound healed well. Compliant with meds and no SSCP. His grandaughter plays softball with my daughter and I get to see [name] [name] at the ballfield. He looks good. Has SOB but it is more pulmonary. BNP in June was normal and CXR showed no CHF. Has CoreVue with his device.05/03/09 Stent to Ramus, circ and RCA Given number of stents will stay on Plavix indefinately  CoreVue data was good in June with no signs of CHF   He has significant COPD would like to be seen by [facility] had PFT;s earlier this year  ROS: Denies fever, malais, weight loss, blurry vision, decreased visual acuity, cough, sputum, SOB, hemoptysis, pleuritic pain, palpitaitons, heartburn, abdominal pain, melena, lower extremity edema, claudication, or rash.  All other systems reviewed and negative  General: Affect appropriate Elderly male with pursed lip breathing HEENT: normal Neck supple with no adenopathy JVP normal no bruits no thyromegaly Lungs clear with end expitory  wheezing and good diaphragmatic motion Heart:  S1/S2 no murmur, no rub, gallop or click PMI normal Abdomen: benighn, BS positve, no tenderness, no AAA no bruit.  No HSM or HJR Distal pulses intact with no bruits No edema Neuro non-focal Skin warm and dry No muscular weakness AICD under left clavicle   Current Outpatient Prescriptions  Medication Sig Dispense Refill  . albuterol (PROVENTIL) (2.5 MG/3ML) 0.083% nebulizer solution Take 2.5 mg by nebulization every 6 (six) hours as needed.        . ALPRAZolam (XANAX)  0.25 MG tablet Take 0.25 mg by mouth at bedtime as needed.       Marland Kitchen aspirin 81 MG tablet Take 81 mg by mouth daily.        . AVODART 0.5 MG capsule Take 0.5 mg by mouth daily.       . clopidogrel (PLAVIX) 75 MG tablet Take 1 tablet (75 mg total) by mouth daily.  30 tablet  12  . Cyanocobalamin (VITAMIN B 12 PO) Inject as directed.      Marland Kitchen dextromethorphan-guaiFENesin (MUCINEX DM) 30-600 MG per 12 hr tablet Take 2 tablets by mouth every 12 (twelve) hours.        . fish oil-omega-3 fatty acids 1000 MG capsule Take 2 g by mouth daily.        Marland Kitchen losartan (COZAAR) 25 MG tablet Take 1 tablet (25 mg total) by mouth daily.  30 tablet  12  . metoprolol tartrate (LOPRESSOR) 25 MG tablet 1/2 po bid      . rosuvastatin (CRESTOR) 20 MG tablet Take 1 tablet (20 mg total) by mouth daily.  30 tablet  6  . Tamsulosin HCl (FLOMAX) 0.4 MG CAPS Take 0.4 mg by mouth daily.       Marland Kitchen tiotropium (SPIRIVA) 18 MCG inhalation capsule Place 18 mcg into inhaler and inhale daily.          Allergies  Review of patient's allergies indicates no known allergies.  Electrocardiogram: 01/08/11/  NSR rate 64  PAC nonspecific ST/T wave changes  Assessment  and Plan

## 2011-09-15 NOTE — Assessment & Plan Note (Signed)
Discussed low carb diet.  Target hemoglobin A1c is 6.5 or less.  Continue current medications.  

## 2011-09-16 NOTE — Progress Notes (Signed)
icd check in clinic  

## 2011-09-18 LAB — ICD DEVICE OBSERVATION
AL AMPLITUDE: 3.2 mv
AL IMPEDENCE ICD: 375 Ohm
BAMS-0001: 150 {beats}/min
CHARGE TIME: 9.1 s
DEV-0020ICD: NEGATIVE
HV IMPEDENCE: 74 Ohm
TOT-0007: 3
TOT-0008: 0
TZON-0005SLOWVT: 6
VENTRICULAR PACING ICD: 0.01 pct
VF: 0

## 2011-09-23 ENCOUNTER — Institutional Professional Consult (permissible substitution): Payer: Medicare Other | Admitting: Internal Medicine

## 2011-10-01 ENCOUNTER — Encounter: Payer: Self-pay | Admitting: Internal Medicine

## 2011-10-01 ENCOUNTER — Ambulatory Visit (INDEPENDENT_AMBULATORY_CARE_PROVIDER_SITE_OTHER): Payer: Medicare Other | Admitting: Internal Medicine

## 2011-10-01 ENCOUNTER — Ambulatory Visit (INDEPENDENT_AMBULATORY_CARE_PROVIDER_SITE_OTHER)
Admission: RE | Admit: 2011-10-01 | Discharge: 2011-10-01 | Disposition: A | Discharge status: Home / Self Care | Payer: Medicare Other | Source: Ambulatory Visit | Attending: Internal Medicine | Admitting: Internal Medicine

## 2011-10-01 VITALS — BP 122/60 | HR 65 | Temp 98.4°F | Ht 72.0 in | Wt 194.6 lb

## 2011-10-01 DIAGNOSIS — R0989 Other specified symptoms and signs involving the circulatory and respiratory systems: Secondary | ICD-10-CM | POA: Diagnosis not present

## 2011-10-01 DIAGNOSIS — R0600 Dyspnea, unspecified: Secondary | ICD-10-CM

## 2011-10-01 DIAGNOSIS — J449 Chronic obstructive pulmonary disease, unspecified: Secondary | ICD-10-CM | POA: Diagnosis not present

## 2011-10-01 DIAGNOSIS — R0609 Other forms of dyspnea: Secondary | ICD-10-CM

## 2011-10-01 DIAGNOSIS — J438 Other emphysema: Secondary | ICD-10-CM | POA: Diagnosis not present

## 2011-10-01 NOTE — Patient Instructions (Addendum)
Please remember to go to the lab and x-ray department downstairs for your tests - we will call you with the results when they are available.    GERD (REFLUX)  is an extremely common cause of respiratory symptoms like breathing problems, many times with no significant heartburn at all.    It can be treated with medication, but also with lifestyle changes including avoidance of late meals, excessive alcohol, smoking cessation, and avoid fatty foods, chocolate, peppermint, colas, red wine, and acidic juices such as orange juice.  NO MINT OR MENTHOL PRODUCTS SO NO COUGH DROPS  USE SUGARLESS CANDY INSTEAD (jolley ranchers or Stover's)  NO OIL BASED VITAMINS - use powdered substitutes.   Please schedule a follow up office visit in 4 weeks, sooner if needed

## 2011-10-01 NOTE — Progress Notes (Signed)
  Subjective:    Patient ID: [name] [name], male    DOB: 11/14/1925  MRN: [id number]  HPI  22 yowm quit smoking 2010 p MI and breathing worse in 2012-13 referred to pulmonary clinic 10/01/2011  By Dr Eden Emms with GOLD II copd.   10/01/2011 1st pulmonary cc doe indolent onset slowly progressive doe x one year x 50 ft consistently so uses [facility] parking and rides cart at [place].  Assoc with hoarseness and variable sense of wheezing but  No unusual cough, assoc cp, purulent sputum or sinus/hb symptoms on present rx. No better with neb or spiriva, if anything worse breathing on spiriva and also having urinary hesitance.  Limited by knees about same time as breath.  Sleeping ok without nocturnal  or early am exacerbation  of respiratory  c/o's or need for noct saba. Also denies any obvious fluctuation of symptoms with weather or environmental changes or other aggravating or alleviating factors except as outlined above       Review of Systems  Constitutional: Negative for fever, chills, diaphoresis, activity change, appetite change, fatigue and unexpected weight change.  HENT: Negative for hearing loss, ear pain, nosebleeds, congestion, sore throat, facial swelling, rhinorrhea, sneezing, mouth sores, trouble swallowing, neck pain, neck stiffness, dental problem, voice change, postnasal drip, sinus pressure, tinnitus and ear discharge.   Eyes: Negative for visual disturbance.  Respiratory: Positive for shortness of breath and wheezing. Negative for cough, choking and chest tightness.   Cardiovascular: Negative for chest pain and palpitations.  Gastrointestinal: Negative for nausea, vomiting, abdominal pain, constipation, blood in stool and abdominal distention.  Genitourinary: Positive for difficulty urinating.  Musculoskeletal: Positive for joint swelling. Negative for myalgias, back pain, arthralgias and gait problem.  Skin: Positive for rash.  Neurological: Negative for dizziness, tremors, syncope,  weakness, light-headedness, numbness and headaches.  Hematological: Does not bruise/bleed easily.  Psychiatric/Behavioral: Negative for confusion and disturbed wake/sleep cycle. The patient is not nervous/anxious.        Objective:   Physical Exam  Very hoarse amb wm with prominent pseudowheeze Wt Readings from Last 3 Encounters:  10/01/11 194 lb 9.6 oz (88.27 kg)  04/14/11 190 lb (86.183 kg)  03/14/11 189 lb 8 oz (85.957 kg)   HEENT mild turbinate edema.  Oropharynx no thrush or excess pnd or cobblestoning.  No JVD or cervical adenopathy. Mild accessory muscle hypertrophy. Trachea midline, nl thryroid. Chest was hyperinflated by percussion with diminished breath sounds and moderate increased exp time without wheeze. Hoover sign positive at mid inspiration. Regular rate and rhythm without murmur gallop or rub or increase P2 or edema.  Abd: no hsm, nl excursion. Ext warm without cyanosis or clubbing.    CXR  10/01/2011 : No acute abnormalities. Emphysema.    labs ordered: not done by 10/02/2011        Assessment & Plan:

## 2011-10-01 NOTE — Assessment & Plan Note (Addendum)
-   PFT's 04/16/11 FEV1  1.57 (60%) ratio 71 and 12% better p B2, dlco 41 corrects to 58    - 10/01/2011   Walked RA x one lap @ 185 stopped due to  Knees hurt, no sob or desat  Symptoms are markedly disproportionate to objective findings and not clear this is a lung problem (an fev1 of 60% with ratio 71 does not translate to sob p 50 ft not reproduced here) but pt does appear to have difficult airway management issues. DDX of  difficult airways managment all start with A and  include Adherence, Ace Inhibitors, Acid Reflux, Active Sinus Disease, Alpha 1 Antitripsin deficiency, Anxiety masquerading as Airways dz,  ABPA,  allergy(esp in young), Aspiration (esp in elderly), Adverse effects of DPI,  Active smokers, plus two Bs  = Bronchiectasis and Beta blocker use..and one C= CHF  ? Adverse effect of dpi > stop spiriva  ? Acid reflux > try diet and off fish oil, then regroup in one month on just neb prn

## 2011-10-02 ENCOUNTER — Telehealth: Payer: Self-pay | Admitting: *Deleted

## 2011-10-02 NOTE — Telephone Encounter (Signed)
LMTCB for pt 

## 2011-10-02 NOTE — Telephone Encounter (Signed)
Message copied by Christen Butter on Thu Oct 02, 2011 11:18 AM ------      Message from: Sandrea Hughs B      Created: Thu Oct 02, 2011  8:00 AM       Doesn't look like he went for labs, needs these done sooner rather than later but ok wait unil next ov if balks

## 2011-10-06 ENCOUNTER — Telehealth: Payer: Self-pay | Admitting: Internal Medicine

## 2011-10-06 NOTE — Telephone Encounter (Signed)
Per Verlon Au he just needs to go downstairs to have his labs done. I spoke with pt and advised him of this. Ne voiced his understand and is aware to go for this. Nothing further was needed

## 2011-10-06 NOTE — Telephone Encounter (Signed)
Spoke with pt and advised needs to come in for the labs he forgot. He verbalized understanding and states will come in on 10-08-11 and have labs done.

## 2011-10-07 ENCOUNTER — Other Ambulatory Visit (INDEPENDENT_AMBULATORY_CARE_PROVIDER_SITE_OTHER): Payer: Medicare Other

## 2011-10-07 DIAGNOSIS — R0989 Other specified symptoms and signs involving the circulatory and respiratory systems: Secondary | ICD-10-CM

## 2011-10-07 DIAGNOSIS — R0609 Other forms of dyspnea: Secondary | ICD-10-CM | POA: Diagnosis not present

## 2011-10-07 DIAGNOSIS — R0600 Dyspnea, unspecified: Secondary | ICD-10-CM

## 2011-10-07 LAB — CBC WITH DIFFERENTIAL/PLATELET
Basophils Absolute: 0 10*3/uL (ref 0.0–0.1)
Eosinophils Absolute: 0.1 10*3/uL (ref 0.0–0.7)
Hemoglobin: 10.2 g/dL — ABNORMAL LOW (ref 13.0–17.0)
Lymphocytes Relative: 12.4 % (ref 12.0–46.0)
MCHC: 31.6 g/dL (ref 30.0–36.0)
MCV: 68.8 fl — ABNORMAL LOW (ref 78.0–100.0)
Monocytes Absolute: 0.4 10*3/uL (ref 0.1–1.0)
Neutro Abs: 6.4 10*3/uL (ref 1.4–7.7)
RDW: 18.7 % — ABNORMAL HIGH (ref 11.5–14.6)

## 2011-10-07 NOTE — Progress Notes (Signed)
Quick Note:  Spoke with pt and notified of results per Dr. Wert. Pt verbalized understanding and denied any questions.  ______ 

## 2011-10-09 LAB — BASIC METABOLIC PANEL
Chloride: 107 mEq/L (ref 96–112)
GFR: 45.25 mL/min — ABNORMAL LOW (ref >60.00)
Potassium: 4.4 mEq/L (ref 3.5–5.1)
Sodium: 143 mEq/L (ref 135–145)

## 2011-10-13 ENCOUNTER — Telehealth: Payer: Self-pay | Admitting: *Deleted

## 2011-10-13 ENCOUNTER — Other Ambulatory Visit: Payer: Self-pay | Admitting: *Deleted

## 2011-10-13 ENCOUNTER — Ambulatory Visit (HOSPITAL_BASED_OUTPATIENT_CLINIC_OR_DEPARTMENT_OTHER): Payer: Medicare Other

## 2011-10-13 VITALS — BP 110/59 | HR 80 | Temp 97.1°F

## 2011-10-13 DIAGNOSIS — E538 Deficiency of other specified B group vitamins: Secondary | ICD-10-CM | POA: Diagnosis not present

## 2011-10-13 MED ORDER — CYANOCOBALAMIN 1000 MCG/ML IJ SOLN
1000.0000 ug | Freq: Once | INTRAMUSCULAR | Status: AC
  Administered 2011-10-13: 1000 ug via SUBCUTANEOUS

## 2011-10-13 NOTE — Telephone Encounter (Signed)
Called pt at home and spoke with wife.   Informed wife that pt will need to go to [facility] radiology for bone survey to be done on 12/05/11 after lab draw at [facility] same day.   Confirmed date and time for f/u with md on 12/12/11 with wife.   Wife voiced understanding and stated she would relay message to pt. Onc Tx schedule made for bone survey.

## 2011-10-13 NOTE — Progress Notes (Signed)
Quick Note:  Spoke with pt and notified of results per Dr. Wert. Pt verbalized understanding and denied any questions.  ______ 

## 2011-10-15 ENCOUNTER — Telehealth: Payer: Self-pay | Admitting: Hematology and Oncology

## 2011-10-15 NOTE — Telephone Encounter (Signed)
Added bone survey for 11/8. S/w wife today re xray and lb for 11/8 - lb 12:30 pm and xray 1pm. Also confirmed 11/15 f/u.

## 2011-10-20 ENCOUNTER — Telehealth: Payer: Self-pay | Admitting: Cardiovascular Disease

## 2011-10-20 ENCOUNTER — Ambulatory Visit: Payer: Medicare Other | Admitting: Adult Health

## 2011-10-20 ENCOUNTER — Telehealth: Payer: Self-pay | Admitting: Internal Medicine

## 2011-10-20 NOTE — Telephone Encounter (Signed)
Called, spoke with pt's daughter, Judeth Cornfield.  States when pt was seen by Dr. Sherene Sires on 10/01/11, he was under the impression he was to stop spiriva and albuterol neb.  States since doing this he is much worse.  Reports on Saturday he walked from one side of the parking lot to the other side with a lot of SOB to the point pt was scared.  Also, reports he has wheezing and coughs some.  OV scheduled today at 3:30 pm with Tammy NP.  [name] aware.  She will inform pt and will call back if he cannot come in today.

## 2011-10-20 NOTE — Telephone Encounter (Signed)
Pt's dtr calling re referral to pulmonary, pt told

## 2011-10-29 DIAGNOSIS — R350 Frequency of micturition: Secondary | ICD-10-CM | POA: Diagnosis not present

## 2011-10-29 DIAGNOSIS — R39198 Other difficulties with micturition: Secondary | ICD-10-CM | POA: Diagnosis not present

## 2011-10-29 DIAGNOSIS — N401 Enlarged prostate with lower urinary tract symptoms: Secondary | ICD-10-CM | POA: Diagnosis not present

## 2011-10-29 DIAGNOSIS — R339 Retention of urine, unspecified: Secondary | ICD-10-CM | POA: Diagnosis not present

## 2011-10-31 ENCOUNTER — Ambulatory Visit (INDEPENDENT_AMBULATORY_CARE_PROVIDER_SITE_OTHER): Payer: Medicare Other | Admitting: Internal Medicine

## 2011-10-31 ENCOUNTER — Encounter: Payer: Self-pay | Admitting: Internal Medicine

## 2011-10-31 VITALS — BP 132/70 | HR 67 | Temp 97.8°F | Ht 71.0 in | Wt 192.6 lb

## 2011-10-31 DIAGNOSIS — R0989 Other specified symptoms and signs involving the circulatory and respiratory systems: Secondary | ICD-10-CM | POA: Diagnosis not present

## 2011-10-31 DIAGNOSIS — J45909 Unspecified asthma, uncomplicated: Secondary | ICD-10-CM | POA: Diagnosis not present

## 2011-10-31 DIAGNOSIS — R0600 Dyspnea, unspecified: Secondary | ICD-10-CM

## 2011-10-31 DIAGNOSIS — D649 Anemia, unspecified: Secondary | ICD-10-CM

## 2011-10-31 DIAGNOSIS — R0609 Other forms of dyspnea: Secondary | ICD-10-CM

## 2011-10-31 NOTE — Progress Notes (Signed)
  Subjective:    Patient ID: [name] [name], male    DOB: [birth year]  MRN: [id number]  HPI  79 yowm quit smoking 2010 p MI and breathing worse in 2012-13 referred to pulmonary clinic 10/01/2011  By Dr Eden Emms with GOLD II copd using 02 at hs since MI   10/01/2011 1st pulmonary cc doe indolent onset slowly progressive doe x one year x 50 ft consistently so uses [facility] and rides cart at [facility].  Assoc with hoarseness and variable sense of wheezing but  No unusual cough, assoc cp, purulent sputum or sinus/hb symptoms on present rx. No better with neb or spiriva, if anything worse breathing on spiriva and also having urinary hesitance.  Limited by knees about same time as breath, confirmed during ov s desat. rec  Labs c/w mild cri, anemia, nl tsh and bnp GERD diet Please schedule a follow up office visit in 4 weeks, sooner if needed     10/31/2011 f/u ov/Glema Takaki cc sob no better, but still knees stop him from being sob with activity, does have episodes where fm thinks he's wheezing, ? Better with neb, pt not sure about either. No obvious daytime variabilty or assoc chronic cough or cp or chest tightness, subjective wheeze overt sinus or hb symptoms. No unusual exp hx or h/o childhood pna/ asthma or premature birth to his knowledge.   Sleeping  fine on 02 without nocturnal  or early am exacerbation  of respiratory  c/o's or need for noct saba. Also denies any obvious fluctuation of symptoms with weather or environmental changes or other aggravating or alleviating factors except as outlined above   ROS  The following are not active complaints unless bolded sore throat, dysphagia, dental problems, itching, sneezing,  nasal congestion or excess/ purulent secretions, ear ache,   fever, chills, sweats, unintended wt loss, pleuritic or exertional cp, hemoptysis,  orthopnea pnd or leg swelling, presyncope, palpitations, heartburn, abdominal pain, anorexia, nausea, vomiting, diarrhea  or change in bowel or urinary  habits, change in stools or urine, dysuria,hematuria,  rash, arthralgias, visual complaints, headache, numbness weakness or ataxia or problems with walking or coordination,  change in mood/affect or memory.                 Objective:   Physical Exam  Very hoarse amb wm with prominent pseudowheeze Wt 192 10/31/2011  Wt Readings from Last 3 Encounters:  10/01/11 194 lb 9.6 oz (88.27 kg)  04/14/11 190 lb (86.183 kg)  03/14/11 189 lb 8 oz (85.957 kg)   HEENT mild turbinate edema.  Oropharynx no thrush or excess pnd or cobblestoning.  No JVD or cervical adenopathy. Mild accessory muscle hypertrophy. Trachea midline, nl thryroid. Chest was hyperinflated by percussion with diminished breath sounds and moderate increased exp time without wheeze. Hoover sign positive at mid inspiration. Regular rate and rhythm without murmur gallop or rub or increase P2 or edema.  Abd: no hsm, nl excursion. Ext warm without cyanosis or clubbing.    CXR  10/01/2011 : No acute abnormalities. Emphysema.          Assessment & Plan:

## 2011-10-31 NOTE — Patient Instructions (Addendum)
Prilosec 20 mg Take 30-60 min before first meal of the day   Only use the nebulizer when you feel you need it   GERD (REFLUX)  is an extremely common cause of respiratory symptoms like yours , many times with no significant heartburn at all.    It can be treated with medication, but also with lifestyle changes including avoidance of late meals, excessive alcohol, smoking cessation, and avoid fatty foods, chocolate, peppermint, colas, red wine, and acidic juices such as orange juice.  NO MINT OR MENTHOL PRODUCTS SO NO COUGH DROPS  USE SUGARLESS CANDY INSTEAD (jolley ranchers or Stover's)  NO OIL BASED VITAMINS - use powdered substitutes.   If you are satisfied with your treatment plan let your doctor know   If in any way you are not 100% satisfied,  please tell us.  If 100% better, tell your friends!

## 2011-11-01 NOTE — Assessment & Plan Note (Addendum)
-   PFT's 04/16/11 FEV1  1.57 (60%) ratio 71 and 12% better p B2, dlco 41 corrects to 58    - 10/01/2011   Walked RA x one lap @ 185 stopped due to  Knees hurt, no sob or desat  His wheezing is upper airway on exam - this may be what his family is hearing and he does have a small reversible component suggesting asthma, so a unifying dx is gerd with secondary vcd and asthma  I had an extended discussion with the patient and daughter today lasting 15 to 20 minutes of a 25 minute visit on the following issues:   As long as sob not limiting acitivity, no need to f/u here or even use the neb  Try ppi qd and diet, reviewed, then consider ent eval if still "wheezing" to r/o a mechanical upper airway cause  Pulmonary f/u can be prn

## 2011-11-01 NOTE — Assessment & Plan Note (Signed)
-   10/01/2011   Walked RA x one lap @ 185 stopped due to knees hurting, no desat     - 10/03/11 labs: c/w microcyctic anemia > daughter reports thalassemia   Chart review reveals pt chronically mildly anemia with low mcv explained by reported thalassemia so f/u Dr Donette Larry is all I recommended

## 2011-11-10 ENCOUNTER — Ambulatory Visit (HOSPITAL_BASED_OUTPATIENT_CLINIC_OR_DEPARTMENT_OTHER): Payer: Medicare Other

## 2011-11-10 VITALS — BP 129/70 | HR 81 | Temp 97.4°F

## 2011-11-10 DIAGNOSIS — E538 Deficiency of other specified B group vitamins: Secondary | ICD-10-CM | POA: Diagnosis not present

## 2011-11-10 MED ORDER — CYANOCOBALAMIN 1000 MCG/ML IJ SOLN
1000.0000 ug | Freq: Once | INTRAMUSCULAR | Status: AC
  Administered 2011-11-10: 1000 ug via SUBCUTANEOUS

## 2011-11-26 ENCOUNTER — Telehealth: Payer: Self-pay | Admitting: Hematology and Oncology

## 2011-11-26 NOTE — Telephone Encounter (Signed)
Rep from Dr Darci Needle' office [phone]) lmonvm and asked that appt for 11/15 be cx'd. S/w office this morning and pt actually wants to keep 11/15 f/u but cx 11/8 lb/xray. Per re pt will not be able to do lb/xray b4 seeing LO due to he will be out of town. S/w desk nurse she is aware of above.

## 2011-11-27 ENCOUNTER — Telehealth: Payer: Self-pay | Admitting: Nurse Practitioner

## 2011-11-27 NOTE — Telephone Encounter (Signed)
Spoke with pt assistant.  Inquired re: can bone survey and lab be rescheduled.  Per assistant- appts were cancelled due to pt going out of town for long weekend.  Ok to reschedule to other dates.  States pt has Wednesdays off so Wednesdays are good.  Pt office closed Tuesdays after 12, so may be good times as well.  Information forwarded to scheduler for rescheduling prior to MD appointment.

## 2011-11-28 ENCOUNTER — Telehealth: Payer: Self-pay | Admitting: *Deleted

## 2011-11-28 NOTE — Telephone Encounter (Signed)
called patient patient stated that 12-10-2011 (which is a Wednesday) what the orders said patient is off on Wednesday patient stated he would call back when he is ready to schedule the lab and bone survey

## 2011-12-02 ENCOUNTER — Telehealth: Payer: Self-pay | Admitting: Hematology and Oncology

## 2011-12-02 ENCOUNTER — Ambulatory Visit: Payer: Medicare Other

## 2011-12-02 NOTE — Telephone Encounter (Signed)
Moved 11/15 f/u to 11/29 due to poof. S/w Betsy @ office and per [facility] pt is not wanting to keep any of the appts and will call us to r/s. Per Tamela Oddi we need to wait until we hear from pt. Tamela Oddi made aware that as of now pt on schedule for inj 11/15 and f/u 11/29. Message to desk nurse.

## 2011-12-02 NOTE — Telephone Encounter (Signed)
Called pt's office today to r/s lb/bone survey. S/w Betsy and per [facility] pt does not want to r/s right now - he will call when he is ready. I did speak Arley Phenix re last conversation with office 10/31 (desk nurse) that pt would be gone for 11/8 only and that it was ok to r/s lb/xray. Per Tamela Oddi she would leave message for pt that we called to r/s. lmonvm informing desk nurse.

## 2011-12-05 ENCOUNTER — Other Ambulatory Visit (HOSPITAL_COMMUNITY): Payer: Medicare Other

## 2011-12-05 ENCOUNTER — Other Ambulatory Visit: Payer: Medicare Other | Admitting: Lab

## 2011-12-08 ENCOUNTER — Other Ambulatory Visit (INDEPENDENT_AMBULATORY_CARE_PROVIDER_SITE_OTHER): Payer: Medicare Other

## 2011-12-08 ENCOUNTER — Ambulatory Visit (INDEPENDENT_AMBULATORY_CARE_PROVIDER_SITE_OTHER)
Admission: RE | Admit: 2011-12-08 | Discharge: 2011-12-08 | Disposition: A | Discharge status: Home / Self Care | Payer: Medicare Other | Source: Ambulatory Visit | Attending: Internal Medicine | Admitting: Internal Medicine

## 2011-12-08 ENCOUNTER — Encounter: Payer: Self-pay | Admitting: Internal Medicine

## 2011-12-08 ENCOUNTER — Ambulatory Visit (INDEPENDENT_AMBULATORY_CARE_PROVIDER_SITE_OTHER): Payer: Medicare Other | Admitting: Internal Medicine

## 2011-12-08 VITALS — BP 118/60 | HR 80 | Temp 97.6°F | Ht 71.0 in | Wt 190.0 lb

## 2011-12-08 DIAGNOSIS — J45909 Unspecified asthma, uncomplicated: Secondary | ICD-10-CM

## 2011-12-08 DIAGNOSIS — J449 Chronic obstructive pulmonary disease, unspecified: Secondary | ICD-10-CM | POA: Diagnosis not present

## 2011-12-08 DIAGNOSIS — R0989 Other specified symptoms and signs involving the circulatory and respiratory systems: Secondary | ICD-10-CM | POA: Diagnosis not present

## 2011-12-08 DIAGNOSIS — R0600 Dyspnea, unspecified: Secondary | ICD-10-CM

## 2011-12-08 DIAGNOSIS — N259 Disorder resulting from impaired renal tubular function, unspecified: Secondary | ICD-10-CM

## 2011-12-08 DIAGNOSIS — R0609 Other forms of dyspnea: Secondary | ICD-10-CM

## 2011-12-08 DIAGNOSIS — D649 Anemia, unspecified: Secondary | ICD-10-CM | POA: Diagnosis not present

## 2011-12-08 LAB — BASIC METABOLIC PANEL
Calcium: 8.6 mg/dL (ref 8.4–10.5)
Creatinine, Ser: 1.3 mg/dL (ref 0.4–1.5)
GFR: 56.83 mL/min — ABNORMAL LOW (ref >60.00)

## 2011-12-08 LAB — CBC WITH DIFFERENTIAL/PLATELET
Basophils Absolute: 0 10*3/uL (ref 0.0–0.1)
Eosinophils Absolute: 0.1 10*3/uL (ref 0.0–0.7)
MCHC: 32.3 g/dL (ref 30.0–36.0)
MCV: 67.7 fl — ABNORMAL LOW (ref 78.0–100.0)
Monocytes Absolute: 0.3 10*3/uL (ref 0.1–1.0)
Neutrophils Relative %: 82.8 % — ABNORMAL HIGH (ref 43.0–77.0)
Platelets: 102 10*3/uL — ABNORMAL LOW (ref 150.0–400.0)
RBC: 4.43 Mil/uL (ref 4.22–5.81)
WBC: 6.7 10*3/uL (ref 4.5–10.5)

## 2011-12-08 LAB — BRAIN NATRIURETIC PEPTIDE: Pro B Natriuretic peptide (BNP): 45 pg/mL (ref 0.0–100.0)

## 2011-12-08 MED ORDER — PREDNISONE (PAK) 10 MG PO TABS: ORAL_TABLET | ORAL | Status: DC

## 2011-12-08 MED ORDER — FAMOTIDINE 20 MG PO TABS: ORAL_TABLET | ORAL | Status: DC

## 2011-12-08 MED ORDER — BUDESONIDE-FORMOTEROL FUMARATE 160-4.5 MCG/ACT IN AERO: INHALATION_SPRAY | RESPIRATORY_TRACT | Status: DC

## 2011-12-08 NOTE — Patient Instructions (Addendum)
Prednisone 10 mg take  4 each am x 2 days,   2 each am x 2 days,  1 each am x2days and stop   Dymista one twice daily each nostil  Add pepcid 20 mg one at bedtime  Symbicort Take 2 puffs first thing in am and then another 2 puffs about 12 hours later   Only use your albuterol (nebulizer)  as a rescue medication to be used if you can't catch your breath by resting or doing a relaxed purse lip breathing pattern. The less you use it, the better it will work when you need it.  You can use it up to every 4 hours if needed  Work on inhaler technique:  relax and gently blow all the way out then take a nice smooth deep breath back in, triggering the inhaler at same time you start breathing in.  Hold for up to 5 seconds if you can.  Rinse and gargle with water when done   If your mouth or throat starts to bother you,   I suggest you time the inhaler to your dental care and after using the inhaler(s) brush teeth and tongue with a baking soda containing toothpaste and when you rinse this out, gargle with it first to see if this helps your mouth and throat.     Please remember to go to the lab and x-ray department downstairs for your tests - we will call you with the results when they are available.     Please schedule a follow up office visit in 2 weeks, sooner if needed

## 2011-12-08 NOTE — Progress Notes (Signed)
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   10/01/2011 1st pulmonary cc doe indolent onset slowly progressive doe x one year x 50 ft consistently so uses [facility] parking and rides cart at [facility].  Assoc with hoarseness and variable sense of wheezing but  No unusual cough, assoc cp, purulent sputum or sinus/hb symptoms on present rx. No better with neb or spiriva, if anything worse breathing on spiriva and also having urinary hesitance.  Limited by knees about same time as breath, confirmed during ov s desat. rec  Labs c/w mild cri, anemia, nl tsh and bnp GERD diet Please schedule a follow up office visit in 4 weeks, sooner if needed     10/31/2011 f/u ov/Wert cc sob no better, but still knees stop him from being sob with activity, does have episodes where fm thinks he's wheezing, ? Better with neb, pt not sure about either. 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 No unusual exp hx. Breathing ok at rest.  Sleeping  fine on 02 without nocturnal  or early am exacerbation  of respiratory  c/o's or need for noct saba. Also denies any obvious fluctuation of symptoms with weather or environmental changes or other aggravating or alleviating factors except as outlined above   ROS  The following are not active complaints unless bolded sore throat, dysphagia,  dental problems, itching, sneezing,  nasal congestion or excess/ purulent secretions, ear ache,   fever, chills, sweats, unintended wt loss, pleuritic or exertional cp, hemoptysis,  orthopnea pnd or leg swelling, presyncope, palpitations, heartburn, abdominal pain, anorexia, nausea, vomiting, diarrhea  or change in bowel or urinary habits, change in stools or urine, dysuria,hematuria,  rash, arthralgias, visual complaints, headache, numbness weakness or ataxia or problems with walking or coordination,  change in mood/affect or memory.                 Objective:   Physical Exam  Very hoarse w/c wm with mod  pseudowheeze Wt 192 10/31/2011 > 190 12/08/11 Wt Readings from Last 3 Encounters:  10/01/11 194 lb 9.6 oz (88.27 kg)  04/14/11 190 lb (86.183 kg)  03/14/11 189 lb 8 oz (85.957 kg)   HEENT mild turbinate edema.  Oropharynx no thrush or excess pnd or cobblestoning.  No JVD or cervical adenopathy. Mild accessory muscle hypertrophy. Trachea midline, nl thryroid. Chest was hyperinflated by percussion with diminished breath sounds and moderate increased exp time without wheeze. Hoover sign positive at mid inspiration. Regular rate and rhythm without murmur gallop or rub or increase P2 or edema.  Abd: no hsm, nl excursion. Ext warm without cyanosis or clubbing.    CXR  12/08/2011 :  COPD without acute abnormality or interval change.  Assessment & Plan:

## 2011-12-09 ENCOUNTER — Encounter: Payer: Self-pay | Admitting: *Deleted

## 2011-12-09 DIAGNOSIS — Z95810 Presence of automatic (implantable) cardiac defibrillator: Secondary | ICD-10-CM | POA: Insufficient documentation

## 2011-12-09 NOTE — Assessment & Plan Note (Signed)
-   PFT's 04/16/11 FEV1  1.57 (60%) ratio 71 and 12% better p B2, dlco 41 corrects to 58    - 10/01/2011   Walked RA x one lap @ 185 stopped due to  Knees hurt, no sob or desat  He clearly does not have copd but may have sign asthma that responds to alb neb so rec trial of symbicort 160 2bid.  The proper method of use, as well as anticipated side effects, of a metered-dose inhaler are discussed and demonstrated to the patient. Improved effectiveness after extensive coaching during this visit to a level of approximately  50%  ? Acid reflux component > reviewed rx  ? Cardiac asthma > excluded by BNP << 100

## 2011-12-09 NOTE — Assessment & Plan Note (Signed)
Microcytic from thalassemia but anemia slt worse and it would be easy to miss fe def here > needs f/u

## 2011-12-09 NOTE — Assessment & Plan Note (Signed)
Lab Results  Component Value Date   CREATININE 1.3 12/08/2011   CREATININE 1.6* 10/07/2011   CREATININE 1.32 03/07/2011    Adequate control on present rx, reviewed, no evidence of vol overload

## 2011-12-10 ENCOUNTER — Other Ambulatory Visit: Payer: Medicare Other | Admitting: Lab

## 2011-12-10 ENCOUNTER — Other Ambulatory Visit (HOSPITAL_COMMUNITY): Payer: Medicare Other

## 2011-12-10 DIAGNOSIS — H43819 Vitreous degeneration, unspecified eye: Secondary | ICD-10-CM | POA: Diagnosis not present

## 2011-12-10 DIAGNOSIS — H35059 Retinal neovascularization, unspecified, unspecified eye: Secondary | ICD-10-CM | POA: Diagnosis not present

## 2011-12-10 DIAGNOSIS — H353190 Nonexudative age-related macular degeneration, unspecified eye, stage unspecified: Secondary | ICD-10-CM | POA: Diagnosis not present

## 2011-12-10 DIAGNOSIS — H353290 Exudative age-related macular degeneration, unspecified eye, stage unspecified: Secondary | ICD-10-CM | POA: Diagnosis not present

## 2011-12-10 NOTE — Progress Notes (Signed)
Quick Note:  Spoke with the pt's spouse and notified of results/recs per MW She verbalized understanding and denied any questions/concerns Copy to [place] ______

## 2011-12-10 NOTE — Progress Notes (Signed)
Quick Note:  Spouse aware of results Denies questions/concerns ______

## 2011-12-11 ENCOUNTER — Telehealth: Payer: Self-pay | Admitting: Hematology and Oncology

## 2011-12-11 NOTE — Telephone Encounter (Signed)
Received email from switchboard to cx patient's 11/15 appt (inj) message forwarded to LO.

## 2011-12-12 ENCOUNTER — Ambulatory Visit: Payer: Medicare Other

## 2011-12-12 ENCOUNTER — Ambulatory Visit: Payer: Medicare Other | Admitting: Hematology and Oncology

## 2011-12-17 ENCOUNTER — Ambulatory Visit (INDEPENDENT_AMBULATORY_CARE_PROVIDER_SITE_OTHER): Payer: Medicare Other | Admitting: *Deleted

## 2011-12-17 ENCOUNTER — Ambulatory Visit (INDEPENDENT_AMBULATORY_CARE_PROVIDER_SITE_OTHER): Payer: Medicare Other | Admitting: Cardiovascular Disease

## 2011-12-17 ENCOUNTER — Encounter: Payer: Self-pay | Admitting: Internal Medicine

## 2011-12-17 ENCOUNTER — Encounter: Payer: Self-pay | Admitting: Cardiovascular Disease

## 2011-12-17 VITALS — BP 122/58 | HR 73 | Wt 190.0 lb

## 2011-12-17 DIAGNOSIS — Z95810 Presence of automatic (implantable) cardiac defibrillator: Secondary | ICD-10-CM | POA: Diagnosis not present

## 2011-12-17 DIAGNOSIS — I2510 Atherosclerotic heart disease of native coronary artery without angina pectoris: Secondary | ICD-10-CM

## 2011-12-17 DIAGNOSIS — I509 Heart failure, unspecified: Secondary | ICD-10-CM | POA: Diagnosis not present

## 2011-12-17 DIAGNOSIS — I2589 Other forms of chronic ischemic heart disease: Secondary | ICD-10-CM

## 2011-12-17 DIAGNOSIS — D649 Anemia, unspecified: Secondary | ICD-10-CM

## 2011-12-17 DIAGNOSIS — E785 Hyperlipidemia, unspecified: Secondary | ICD-10-CM

## 2011-12-17 DIAGNOSIS — I255 Ischemic cardiomyopathy: Secondary | ICD-10-CM

## 2011-12-17 DIAGNOSIS — R0600 Dyspnea, unspecified: Secondary | ICD-10-CM

## 2011-12-17 DIAGNOSIS — R0989 Other specified symptoms and signs involving the circulatory and respiratory systems: Secondary | ICD-10-CM

## 2011-12-17 LAB — ICD DEVICE OBSERVATION
AL THRESHOLD: 0.75 V
FVT: 0
HV IMPEDENCE: 75 Ohm
PACEART VT: 0
RV LEAD IMPEDENCE ICD: 425 Ohm
TOT-0006: 20111117000000
TOT-0009: 3
TOT-0010: 8
TZON-0004SLOWVT: 30
TZON-0010SLOWVT: 40 ms

## 2011-12-17 NOTE — Assessment & Plan Note (Signed)
Normal function no D/C CoreView activated and reviewed

## 2011-12-17 NOTE — Assessment & Plan Note (Signed)
Previous heavy smoker with COPD and some functional issues pseudowheezing and reflux  Improved with inhalers F/U Wert

## 2011-12-17 NOTE — Assessment & Plan Note (Signed)
Euvolemic with clear lungs and normal BNP  Reviewed CoreView from today and it is discordant.  Shows 8 days in November with lower impedence but this was when he was improving with his inhalers.

## 2011-12-17 NOTE — Assessment & Plan Note (Signed)
Stable with no angina and good activity level.  Continue medical Rx Continue Plavix unless anemia much worse

## 2011-12-17 NOTE — Progress Notes (Signed)
ICD check with [facility]

## 2011-12-17 NOTE — Assessment & Plan Note (Signed)
Cholesterol is at goal.  Continue current dose of statin and diet Rx.  No myalgias or side effects.  F/U  LFT's in 6 months. No results found for this basename: LDLCALC             

## 2011-12-17 NOTE — Patient Instructions (Addendum)
Return office visit 04/09/12 @4 :00pm with Dr. Johney Frame.

## 2011-12-17 NOTE — Progress Notes (Signed)
Patient ID: [name] [name], male   DOB: [birth year], 78 y.o.   MRN: [id number] [name] [name] is seen today after his AICD implant. He is a sudden death survivor with 3 stents. EF by MRI is 31% with a large scar. 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 He still has good quality of life and family support. He has returned to work. He has stopped smoking since his arrest . He denies SSCP, , or syncope.l Device has not fired Wound healed well. Compliant with meds and no SSCP. His grandaughter plays softball with my daughter and I get to see [name] [name] at the ballfield. He looks good  Recent exacerbation of breathing.  CXR normal no CHF BNP normal and improved after seeing Dr Sherene Sires and adjusting inhalers.  Also has thalesemia. Getting iv iron Hb around 10  Does not want to have bone marrow biopsy  05/03/09 Stent to Ramus, circ and RCA Given number of stents will stay on Plavix indefinately   ROS: Denies fever, malais, weight loss, blurry vision, decreased visual acuity, cough, sputum, SOB, hemoptysis, pleuritic pain, palpitaitons, heartburn, abdominal pain, melena, lower extremity edema, claudication, or rash.  All other systems reviewed and negative  General: Affect appropriate Elderly male HEENT: normal Neck supple with no adenopathy JVP normal no bruits no thyromegaly LungsDistant  with no wheezing and good diaphragmatic motion Pseudo purse lipped wheezing Heart:  S1/S2 no murmur, no rub, gallop or click PMI normal Abdomen: benighn, BS positve, no tenderness, no AAA no bruit.  No HSM or HJR Distal pulses intact with no bruits No edema venous stasis and varicosities Neuro non-focal Skin warm and dry No muscular weakness AICD under clavicle in good position   Current Outpatient Prescriptions  Medication Sig Dispense Refill  . albuterol (PROVENTIL) (2.5 MG/3ML) 0.083% nebulizer solution Take 2.5 mg by nebulization every 6 (six) hours as needed.        . ALPRAZolam (XANAX) 0.25  MG tablet Take 0.25 mg by mouth at bedtime as needed.       Marland Kitchen aspirin 325 MG tablet Take 325 mg by mouth daily.      . AVODART 0.5 MG capsule Take 0.5 mg by mouth daily.       . Azelastine-Fluticasone (DYMISTA NA) Place into the nose 2 (two) times daily.      . budesonide-formoterol (SYMBICORT) 160-4.5 MCG/ACT inhaler Take 2 puffs first thing in am and then another 2 puffs about 12 hours later.  1 Inhaler  0  . clopidogrel (PLAVIX) 75 MG tablet Take 1 tablet (75 mg total) by mouth daily.  30 tablet  12  . Cyanocobalamin (VITAMIN B 12 PO) Inject as directed.      . famotidine (PEPCID) 20 MG tablet One at bedtime  30 tablet  2  . losartan (COZAAR) 25 MG tablet Take 1 tablet (25 mg total) by mouth daily.  30 tablet  12  . metoprolol tartrate (LOPRESSOR) 25 MG tablet 1/2 po bid      . nitroGLYCERIN (NITROSTAT) 0.4 MG SL tablet Place 1 tablet (0.4 mg total) under the tongue every 5 (five) minutes as needed for chest pain.  25 tablet  4  . rosuvastatin (CRESTOR) 20 MG tablet Take 5 mg by mouth daily.        Allergies  Review of patient's allergies indicates no known allergies.  Electrocardiogram:  Assessment and Plan

## 2011-12-17 NOTE — Patient Instructions (Signed)
Your physician wants you to follow-up in:  6 MONTHS WITH DR NISHAN  You will receive a reminder letter in the mail two months in advance. If you don't receive a letter, please call our office to schedule the follow-up appointment. Your physician recommends that you continue on your current medications as directed. Please refer to the Current Medication list given to you today. 

## 2011-12-17 NOTE — Assessment & Plan Note (Signed)
Continue iv iron Rx  Patient declines BM biopsy at this time

## 2011-12-23 ENCOUNTER — Encounter: Payer: Self-pay | Admitting: Internal Medicine

## 2011-12-23 ENCOUNTER — Ambulatory Visit (INDEPENDENT_AMBULATORY_CARE_PROVIDER_SITE_OTHER): Payer: Medicare Other | Admitting: Internal Medicine

## 2011-12-23 VITALS — BP 122/72 | HR 55 | Temp 97.9°F | Ht 72.0 in | Wt 187.0 lb

## 2011-12-23 DIAGNOSIS — J45909 Unspecified asthma, uncomplicated: Secondary | ICD-10-CM | POA: Diagnosis not present

## 2011-12-23 MED ORDER — BUDESONIDE-FORMOTEROL FUMARATE 160-4.5 MCG/ACT IN AERO: INHALATION_SPRAY | RESPIRATORY_TRACT | Status: DC

## 2011-12-23 NOTE — Assessment & Plan Note (Signed)
-   PFT's 04/16/11 FEV1  1.57 (60%) ratio 71 and 12% better p B2, dlco 41 corrects to 58    - 10/01/2011   Walked RA x one lap @ 185 stopped due to  Knees hurt, no sob or desat    - HFA 50% p coaching 12/23/2011   Appears to have an asthmatic component as reports improvement p rx with symbicort despite very poor hfa technique.  rec  Continue symbicort The proper method of use, as well as anticipated side effects, of a metered-dose inhaler are discussed and demonstrated to the patient. Improved effectiveness after extensive coaching during this visit to a level of approximately  50% from baseline of 10% at most.  In meantime has neb for prn use, continue dymista to suppress pnds and pepcid for ? Noct gerd.    Each maintenance medication was reviewed in detail including most importantly the difference between maintenance and as needed and under what circumstances the prns are to be used.  Please see instructions for details which were reviewed in writing and the patient given a copy.

## 2011-12-23 NOTE — Progress Notes (Signed)
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  HPI 10/01/2011 1st pulmonary cc doe indolent onset slowly progressive doe x one year x 50 ft consistently so uses [facility] parking and rides cart at [place].  Assoc with hoarseness and variable sense of wheezing but  No unusual cough, assoc cp, purulent sputum or sinus/hb symptoms on present rx. No better with neb or spiriva, if anything worse breathing worse p rx  spiriva and also having urinary hesitance.  Limited by knees about same time as breath, confirmed during ov s desat. rec  Labs c/w mild cri, anemia, nl tsh and bnp GERD diet        10/31/2011 f/u ov/[name] [name] cc sob no better, but still knees stop him from being sob with activity, does have episodes where fm thinks he's wheezing, ? Better with neb, pt not sure about either. 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  No obvious daytime variabilty or assoc chronic cough or cp or chest tightness, subjective wheeze overt sinus or  hb symptoms. No unusual exp hx or h/o childhood pna/ asthma or premature birth to his knowledge.   Sleeping  fine on 02 without nocturnal and p pepcid 20 mg at hs s early am exacerbation  of respiratory  c/o's or need for noct saba. Also denies any obvious fluctuation of symptoms with weather or environmental changes or other aggravating or alleviating factors except as outlined above   ROS  The following are not active complaints unless bolded sore throat, dysphagia, dental problems, itching, sneezing,  nasal congestion or excess/ purulent secretions, ear ache,   fever, chills, sweats, unintended wt loss, pleuritic or exertional cp, hemoptysis,  orthopnea pnd or leg swelling, presyncope, palpitations, heartburn, abdominal pain, anorexia, nausea, vomiting, diarrhea  or change in bowel or urinary habits, change in stools or urine, dysuria,hematuria,  rash, arthralgias, visual complaints, headache, numbness weakness or ataxia or problems with walking or coordination,  change in mood/affect or memory.            Objective:   Physical Exam  Mildly hoarse amb  wm no longer pseudowheezes  Wt 192 10/31/2011 > 190 12/08/11 > 12/23/2011  187   HEENT mild turbinate edema.  Oropharynx no thrush or excess pnd or cobblestoning.  No JVD or cervical adenopathy. Mild  accessory muscle hypertrophy. Trachea midline, nl thryroid. Chest was hyperinflated by percussion with diminished breath sounds and moderate increased exp time without wheeze. Hoover sign positive at mid inspiration. Regular rate and rhythm without murmur gallop or rub or increase P2 or edema.  Abd: no hsm, nl excursion. Ext warm without cyanosis or clubbing.    CXR  12/08/2011 :  COPD without acute abnormality or interval change.            Assessment & Plan:

## 2011-12-23 NOTE — Patient Instructions (Signed)
Continue symbicort 160 Take 2 puffs first thing in am and then another 2 puffs about 12 hours later.   Work on inhaler technique:  relax and gently blow all the way out then take a nice smooth deep breath back in, triggering the inhaler at same time you start breathing in.  Hold for up to 5 seconds if you can.  Rinse and gargle with water when done   If your mouth or throat starts to bother you,   I suggest you time the inhaler to your dental care and after using the inhaler(s) brush teeth and tongue with a baking soda containing toothpaste and when you rinse this out, gargle with it first to see if this helps your mouth and throat.     Please schedule a follow up office visit in 6 weeks, call sooner if needed

## 2011-12-24 ENCOUNTER — Telehealth: Payer: Self-pay | Admitting: *Deleted

## 2011-12-24 NOTE — Telephone Encounter (Signed)
Spoke with daughter Judeth Cornfield and was informed that pt wished to reschedule f/u appt with md to a later date.  Pt did not want to come during the holiday.   Informed Judeth Cornfield that pt is due for a bone survey also.  Per Judeth Cornfield, pt did not want to do bone survey either.   Asked if pt still wants to received B12 injection - which is due at office visit on 12/26/11.   Judeth Cornfield requested f/u and injection appts to be moved to another date.   Informed Judeth Cornfield that a scheduler will contact pt with md's decision.

## 2011-12-26 ENCOUNTER — Telehealth: Payer: Self-pay | Admitting: Hematology and Oncology

## 2011-12-26 ENCOUNTER — Ambulatory Visit: Payer: Medicare Other | Admitting: Hematology and Oncology

## 2011-12-26 ENCOUNTER — Other Ambulatory Visit: Payer: Self-pay | Admitting: *Deleted

## 2011-12-26 NOTE — Telephone Encounter (Signed)
pt is aware of his 12/6 md appt but pt declines to have his lab and his bone survey,Thu is aware      anne  °

## 2011-12-29 ENCOUNTER — Other Ambulatory Visit: Payer: Medicare Other | Admitting: Lab

## 2011-12-30 ENCOUNTER — Telehealth: Payer: Self-pay | Admitting: *Deleted

## 2011-12-30 NOTE — Telephone Encounter (Signed)
Spoke with Tamela Oddi, receptionist and gave her new appt date and time for pt  On  01/09/12  At  230 pm.   Tamela Oddi voiced understanding, and stated she would relay message to pt. [facility]  Phone    [phone].

## 2012-01-02 ENCOUNTER — Ambulatory Visit: Payer: Medicare Other | Admitting: Hematology and Oncology

## 2012-01-02 ENCOUNTER — Ambulatory Visit: Payer: Medicare Other

## 2012-01-02 DIAGNOSIS — H353290 Exudative age-related macular degeneration, unspecified eye, stage unspecified: Secondary | ICD-10-CM | POA: Diagnosis not present

## 2012-01-09 ENCOUNTER — Ambulatory Visit (HOSPITAL_BASED_OUTPATIENT_CLINIC_OR_DEPARTMENT_OTHER): Payer: BLUE CROSS/BLUE SHIELD | Admitting: Hematology and Oncology

## 2012-01-09 ENCOUNTER — Encounter: Payer: Self-pay | Admitting: Hematology and Oncology

## 2012-01-09 VITALS — BP 135/69 | HR 59 | Temp 97.5°F | Resp 24 | Ht 72.0 in | Wt 186.7 lb

## 2012-01-09 DIAGNOSIS — E538 Deficiency of other specified B group vitamins: Secondary | ICD-10-CM

## 2012-01-09 DIAGNOSIS — D638 Anemia in other chronic diseases classified elsewhere: Secondary | ICD-10-CM

## 2012-01-09 DIAGNOSIS — D539 Nutritional anemia, unspecified: Secondary | ICD-10-CM

## 2012-01-09 DIAGNOSIS — D649 Anemia, unspecified: Secondary | ICD-10-CM

## 2012-01-09 DIAGNOSIS — D472 Monoclonal gammopathy: Secondary | ICD-10-CM

## 2012-01-09 MED ORDER — CYANOCOBALAMIN 1000 MCG/ML IJ SOLN
1000.0000 ug | Freq: Once | INTRAMUSCULAR | Status: AC
  Administered 2012-01-09: 1000 ug via SUBCUTANEOUS

## 2012-01-09 NOTE — Patient Instructions (Addendum)
[name] [name]  [id number]   [facility] - AFTER VISIT SUMMARY   **RECOMMENDATIONS MADE BY THE CONSULTANT AND ANY TEST    RESULTS WILL BE SENT TO YOUR REFERRING DOCTORS.   YOUR EXAM FINDINGS, LABS AND RESULTS WERE DISCUSSED BY YOUR MD TODAY.  YOU CAN GO TO THE [place] WEB SITE FOR INSTRUCTIONS ON HOW TO ASSESS MY CHART FOR ADDITIONAL INFORMATION AS NEEDED.  Your Updated drug allergies are: Allergies as of 01/09/2012  . (No Known Allergies)    Your current list of medications are: Current Outpatient Prescriptions  Medication Sig Dispense Refill  . ALPRAZolam (XANAX) 0.25 MG tablet Take 0.25 mg by mouth at bedtime as needed.       Marland Kitchen aspirin 325 MG tablet Take 325 mg by mouth daily.      . AVODART 0.5 MG capsule Take 0.5 mg by mouth daily.       . Azelastine-Fluticasone (DYMISTA NA) Place into the nose 2 (two) times daily.      . budesonide-formoterol (SYMBICORT) 160-4.5 MCG/ACT inhaler Take 2 puffs first thing in am and then another 2 puffs about 12 hours later.  1 Inhaler  11  . clopidogrel (PLAVIX) 75 MG tablet Take 1 tablet (75 mg total) by mouth daily.  30 tablet  12  . Cyanocobalamin (VITAMIN B 12 PO) Inject as directed.      . famotidine (PEPCID) 20 MG tablet One at bedtime  30 tablet  2  . losartan (COZAAR) 25 MG tablet Take 1 tablet (25 mg total) by mouth daily.  30 tablet  12  . metoprolol tartrate (LOPRESSOR) 25 MG tablet 1/2 po bid      . rosuvastatin (CRESTOR) 20 MG tablet Take 5 mg by mouth daily.      . vitamin B-12 (CYANOCOBALAMIN) 1000 MCG tablet Take 1,000 mcg by mouth daily.      . nitroGLYCERIN (NITROSTAT) 0.4 MG SL tablet Place 1 tablet (0.4 mg total) under the tongue every 5 (five) minutes as needed for chest pain.  25 tablet  4   Current Facility-Administered Medications  Medication Dose Route Frequency Provider Last Rate Last Dose  . cyanocobalamin ((VITAMIN B-12)) injection 1,000 mcg  1,000 mcg Subcutaneous Once Jari Carollo I Jerrid Forgette, MD          INSTRUCTIONS GIVEN AND DISCUSSED:  See attached schedule   SPECIAL INSTRUCTIONS/FOLLOW-UP:  See above.  I acknowledge that I have been informed and understand all the instructions given to me and received a copy.I know to contact the clinic, my physician, or go to the emergency Department if any problems should occur.   I do not have any more questions at this time, but understand that I may call the [facility] at [phone] during business hours should I have any further questions or need assistance in obtaining follow-up care.

## 2012-01-09 NOTE — Progress Notes (Signed)
This office note has been dictated.

## 2012-01-10 NOTE — Progress Notes (Signed)
CC:   [name], MD  IDENTIFYING STATEMENT:  The patient is an 78 year old man who presents for followup.  INTERVAL HISTORY:  The patient was last seen 9 months ago.  Mr. Dupuy declined blood work.  He had also declined B12 injection that was scheduled last month.  The patient's concerns are essentially logistics. He feels that he has quite a number of physicians and from the hematology perspective, the patient feels that he is not doing much.  He has no specific complaints.  Denies bone pain.  He states his energy levels are fair.  MEDICATIONS:  Reviewed and updated.  Also takes oral vitamin B12 monthly.  ALLERGIES:  None.  PHYSICAL EXAMINATION:  General:  Elderly man in no distress.  Vitals: Pulse 59, blood pressure 135/69, temperature 97.5, respirations 24, weight 186.7 pounds.  HEENT:  Head is atraumatic, normocephalic. Sclerae anicteric.  Mouth moist.  Neck:  Supple.  Chest:  Clear.  CVS: Unremarkable.  Abdomen:  Soft, nontender.  No masses.  Extremities: Plus edema.  LAB DATA:  None.  IMPRESSION AND PLAN:  Mr. Rahm is an 78 year old man seen by Hematology with a number of problems.  The patient desires that his primary care physician oversee his present problems and refer back, if necessary.  The patient's problems are:  1. Multifactorial anemia with beta thalassemia trait, anemia of     chronic disease.  He is not on oral iron. 2. Thrombocytopenia which has been stable, but no recent lab work     obtained. 3. History of vitamin B12 deficiency.  I stressed that he must     continue monthly vitamin B12 injections and also supplement his     diet with oral vitamin B12. 4. Monoclonal gammopathy of undetermined significance, IgA subtype, of     which he had been undergoing surveillance.  My recommendation from     this standpoint is he is to get annual serum protein     electrophoresis, quantitative immunoglobulins IgG, IgA, and IgM,     and serum light chains.  He  is also to obtain an annual bone scan.  The patient will not be formally discharged from the clinic, but he will return as needed.  I also recommend that he follow up with Dr. Donette Larry soon.    ______________________________ Laurice Record, M.D. LIO/MEDQ  D:  01/09/2012  T:  01/10/2012  Job:  [id number]

## 2012-01-13 ENCOUNTER — Telehealth: Payer: Self-pay | Admitting: Hematology and Oncology

## 2012-01-13 NOTE — Telephone Encounter (Signed)
I received a pof dated 01/09/12 in the scheduling pool but no data attached for that day. Per note from 01/09/12 pt would like his primary to oversee his care and refer him back in necessary. No appts scheduled.

## 2012-01-20 ENCOUNTER — Other Ambulatory Visit: Payer: Self-pay | Admitting: Cardiovascular Disease

## 2012-01-20 MED ORDER — LOSARTAN POTASSIUM 25 MG PO TABS: 25.0000 mg | ORAL_TABLET | Freq: Every day | ORAL | Status: DC

## 2012-01-26 DIAGNOSIS — E782 Mixed hyperlipidemia: Secondary | ICD-10-CM | POA: Diagnosis not present

## 2012-01-26 DIAGNOSIS — E538 Deficiency of other specified B group vitamins: Secondary | ICD-10-CM | POA: Diagnosis not present

## 2012-01-26 DIAGNOSIS — Z1331 Encounter for screening for depression: Secondary | ICD-10-CM | POA: Diagnosis not present

## 2012-01-26 DIAGNOSIS — E119 Type 2 diabetes mellitus without complications: Secondary | ICD-10-CM | POA: Diagnosis not present

## 2012-01-26 DIAGNOSIS — J449 Chronic obstructive pulmonary disease, unspecified: Secondary | ICD-10-CM | POA: Diagnosis not present

## 2012-01-26 DIAGNOSIS — I10 Essential (primary) hypertension: Secondary | ICD-10-CM | POA: Diagnosis not present

## 2012-02-05 ENCOUNTER — Telehealth: Payer: Self-pay | Admitting: Internal Medicine

## 2012-02-05 MED ORDER — AZELASTINE-FLUTICASONE 137-50 MCG/ACT NA SUSP: 2.0000 | Freq: Every day | NASAL | Status: DC

## 2012-02-05 MED ORDER — BUDESONIDE-FORMOTEROL FUMARATE 160-4.5 MCG/ACT IN AERO: INHALATION_SPRAY | RESPIRATORY_TRACT | Status: DC

## 2012-02-05 NOTE — Telephone Encounter (Signed)
Spoke with daughter. Request symbicort and dymista be sent to [facility]. I advised will send RX/ nothing further was needed.

## 2012-02-06 DIAGNOSIS — H353290 Exudative age-related macular degeneration, unspecified eye, stage unspecified: Secondary | ICD-10-CM | POA: Diagnosis not present

## 2012-02-06 DIAGNOSIS — H353190 Nonexudative age-related macular degeneration, unspecified eye, stage unspecified: Secondary | ICD-10-CM | POA: Diagnosis not present

## 2012-02-06 DIAGNOSIS — H43819 Vitreous degeneration, unspecified eye: Secondary | ICD-10-CM | POA: Diagnosis not present

## 2012-02-06 DIAGNOSIS — H35059 Retinal neovascularization, unspecified, unspecified eye: Secondary | ICD-10-CM | POA: Diagnosis not present

## 2012-02-16 ENCOUNTER — Ambulatory Visit: Payer: Medicare Other | Admitting: Internal Medicine

## 2012-03-11 ENCOUNTER — Telehealth: Payer: Self-pay | Admitting: Nurse Practitioner

## 2012-03-11 ENCOUNTER — Ambulatory Visit: Payer: Medicare Other | Admitting: Internal Medicine

## 2012-03-11 NOTE — Telephone Encounter (Signed)
Pts dtr called this afternoon stating that pt has been experiencing low grade fever (99) with chills and generalized malaise, wkns, and aches.  No cough, congestion, chest pain, or dyspnea.  In this setting, his BP has been running higher than usual - 170/84.  He has taken his AM dose of metoprolol along with daily dose of cozaar (25mg ).  I advised that he may take an additional 25mg  of cozaar for his bp.  His dtr has been pushing fluids and rest for his other Ss and plans to give him some tylenol as well, which I advised would be fine.

## 2012-03-12 ENCOUNTER — Emergency Department (HOSPITAL_COMMUNITY): Payer: Medicare Other

## 2012-03-12 ENCOUNTER — Encounter (HOSPITAL_COMMUNITY): Payer: Self-pay | Admitting: Emergency Medicine

## 2012-03-12 ENCOUNTER — Inpatient Hospital Stay (HOSPITAL_COMMUNITY)
Admission: EM | Admit: 2012-03-12 | Discharge: 2012-03-17 | DRG: 682 | Disposition: A | Discharge status: Skilled Nursing Facility | Payer: Medicare Other | Attending: Internal Medicine | Admitting: Internal Medicine

## 2012-03-12 DIAGNOSIS — E785 Hyperlipidemia, unspecified: Secondary | ICD-10-CM | POA: Diagnosis present

## 2012-03-12 DIAGNOSIS — R339 Retention of urine, unspecified: Secondary | ICD-10-CM | POA: Diagnosis present

## 2012-03-12 DIAGNOSIS — N390 Urinary tract infection, site not specified: Secondary | ICD-10-CM

## 2012-03-12 DIAGNOSIS — D696 Thrombocytopenia, unspecified: Secondary | ICD-10-CM | POA: Diagnosis not present

## 2012-03-12 DIAGNOSIS — J4489 Other specified chronic obstructive pulmonary disease: Secondary | ICD-10-CM | POA: Diagnosis not present

## 2012-03-12 DIAGNOSIS — E119 Type 2 diabetes mellitus without complications: Secondary | ICD-10-CM | POA: Diagnosis not present

## 2012-03-12 DIAGNOSIS — K5900 Constipation, unspecified: Secondary | ICD-10-CM | POA: Diagnosis present

## 2012-03-12 DIAGNOSIS — N179 Acute kidney failure, unspecified: Secondary | ICD-10-CM | POA: Diagnosis not present

## 2012-03-12 DIAGNOSIS — D649 Anemia, unspecified: Secondary | ICD-10-CM | POA: Diagnosis not present

## 2012-03-12 DIAGNOSIS — Y92009 Unspecified place in unspecified non-institutional (private) residence as the place of occurrence of the external cause: Secondary | ICD-10-CM

## 2012-03-12 DIAGNOSIS — J8410 Pulmonary fibrosis, unspecified: Secondary | ICD-10-CM | POA: Diagnosis not present

## 2012-03-12 DIAGNOSIS — K224 Dyskinesia of esophagus: Secondary | ICD-10-CM | POA: Diagnosis present

## 2012-03-12 DIAGNOSIS — J45909 Unspecified asthma, uncomplicated: Secondary | ICD-10-CM | POA: Diagnosis not present

## 2012-03-12 DIAGNOSIS — N183 Chronic kidney disease, stage 3 unspecified: Secondary | ICD-10-CM | POA: Diagnosis present

## 2012-03-12 DIAGNOSIS — R0609 Other forms of dyspnea: Secondary | ICD-10-CM | POA: Diagnosis not present

## 2012-03-12 DIAGNOSIS — W19XXXA Unspecified fall, initial encounter: Secondary | ICD-10-CM

## 2012-03-12 DIAGNOSIS — I2510 Atherosclerotic heart disease of native coronary artery without angina pectoris: Secondary | ICD-10-CM | POA: Diagnosis present

## 2012-03-12 DIAGNOSIS — R5381 Other malaise: Secondary | ICD-10-CM | POA: Diagnosis present

## 2012-03-12 DIAGNOSIS — R0602 Shortness of breath: Secondary | ICD-10-CM | POA: Diagnosis not present

## 2012-03-12 DIAGNOSIS — N320 Bladder-neck obstruction: Secondary | ICD-10-CM | POA: Diagnosis present

## 2012-03-12 DIAGNOSIS — R509 Fever, unspecified: Secondary | ICD-10-CM | POA: Diagnosis not present

## 2012-03-12 DIAGNOSIS — K219 Gastro-esophageal reflux disease without esophagitis: Secondary | ICD-10-CM | POA: Diagnosis not present

## 2012-03-12 DIAGNOSIS — R269 Unspecified abnormalities of gait and mobility: Secondary | ICD-10-CM | POA: Diagnosis present

## 2012-03-12 DIAGNOSIS — I509 Heart failure, unspecified: Secondary | ICD-10-CM | POA: Diagnosis present

## 2012-03-12 DIAGNOSIS — J441 Chronic obstructive pulmonary disease with (acute) exacerbation: Secondary | ICD-10-CM | POA: Diagnosis present

## 2012-03-12 DIAGNOSIS — R0600 Dyspnea, unspecified: Secondary | ICD-10-CM

## 2012-03-12 DIAGNOSIS — F411 Generalized anxiety disorder: Secondary | ICD-10-CM | POA: Diagnosis present

## 2012-03-12 DIAGNOSIS — N12 Tubulo-interstitial nephritis, not specified as acute or chronic: Secondary | ICD-10-CM | POA: Diagnosis present

## 2012-03-12 DIAGNOSIS — D563 Thalassemia minor: Secondary | ICD-10-CM

## 2012-03-12 DIAGNOSIS — D638 Anemia in other chronic diseases classified elsewhere: Secondary | ICD-10-CM | POA: Diagnosis present

## 2012-03-12 DIAGNOSIS — N289 Disorder of kidney and ureter, unspecified: Secondary | ICD-10-CM

## 2012-03-12 DIAGNOSIS — R1310 Dysphagia, unspecified: Secondary | ICD-10-CM | POA: Diagnosis not present

## 2012-03-12 DIAGNOSIS — J438 Other emphysema: Secondary | ICD-10-CM | POA: Diagnosis not present

## 2012-03-12 DIAGNOSIS — R141 Gas pain: Secondary | ICD-10-CM | POA: Diagnosis not present

## 2012-03-12 DIAGNOSIS — I5031 Acute diastolic (congestive) heart failure: Secondary | ICD-10-CM | POA: Diagnosis not present

## 2012-03-12 DIAGNOSIS — D472 Monoclonal gammopathy: Secondary | ICD-10-CM | POA: Diagnosis present

## 2012-03-12 DIAGNOSIS — Z87898 Personal history of other specified conditions: Secondary | ICD-10-CM | POA: Diagnosis present

## 2012-03-12 DIAGNOSIS — I059 Rheumatic mitral valve disease, unspecified: Secondary | ICD-10-CM | POA: Diagnosis not present

## 2012-03-12 DIAGNOSIS — H902 Conductive hearing loss, unspecified: Secondary | ICD-10-CM

## 2012-03-12 DIAGNOSIS — E538 Deficiency of other specified B group vitamins: Secondary | ICD-10-CM | POA: Diagnosis present

## 2012-03-12 DIAGNOSIS — J449 Chronic obstructive pulmonary disease, unspecified: Secondary | ICD-10-CM | POA: Diagnosis not present

## 2012-03-12 DIAGNOSIS — N189 Chronic kidney disease, unspecified: Secondary | ICD-10-CM

## 2012-03-12 DIAGNOSIS — Z5189 Encounter for other specified aftercare: Secondary | ICD-10-CM | POA: Diagnosis not present

## 2012-03-12 DIAGNOSIS — N400 Enlarged prostate without lower urinary tract symptoms: Secondary | ICD-10-CM | POA: Diagnosis present

## 2012-03-12 DIAGNOSIS — R2681 Unsteadiness on feet: Secondary | ICD-10-CM

## 2012-03-12 DIAGNOSIS — Z9981 Dependence on supplemental oxygen: Secondary | ICD-10-CM | POA: Diagnosis not present

## 2012-03-12 HISTORY — DX: Unspecified macular degeneration: H35.30

## 2012-03-12 HISTORY — DX: Unspecified hemorrhoids: K64.9

## 2012-03-12 HISTORY — DX: Old myocardial infarction: I25.2

## 2012-03-12 HISTORY — DX: Unspecified cataract: H26.9

## 2012-03-12 LAB — BASIC METABOLIC PANEL
CO2: 25 mEq/L (ref 19–32)
Calcium: 8.3 mg/dL — ABNORMAL LOW (ref 8.4–10.5)
Creatinine, Ser: 2.22 mg/dL — ABNORMAL HIGH (ref 0.50–1.35)
Glucose, Bld: 98 mg/dL (ref 70–99)

## 2012-03-12 LAB — URINALYSIS, ROUTINE W REFLEX MICROSCOPIC
Ketones, ur: NEGATIVE mg/dL
Nitrite: NEGATIVE
Protein, ur: 100 mg/dL — AB

## 2012-03-12 LAB — CBC
Hemoglobin: 9 g/dL — ABNORMAL LOW (ref 13.0–17.0)
MCHC: 32.3 g/dL (ref 30.0–36.0)
RDW: 16.9 % — ABNORMAL HIGH (ref 11.5–15.5)

## 2012-03-12 LAB — CBC WITH DIFFERENTIAL/PLATELET
Basophils Absolute: 0 10*3/uL (ref 0.0–0.1)
Basophils Relative: 0 % (ref 0–1)
HCT: 28.7 % — ABNORMAL LOW (ref 39.0–52.0)
MCHC: 32.4 g/dL (ref 30.0–36.0)
Monocytes Absolute: 0.7 10*3/uL (ref 0.1–1.0)
Neutro Abs: 8.6 10*3/uL — ABNORMAL HIGH (ref 1.7–7.7)
RDW: 16.8 % — ABNORMAL HIGH (ref 11.5–15.5)

## 2012-03-12 LAB — GLUCOSE, CAPILLARY

## 2012-03-12 LAB — CREATININE, SERUM
Creatinine, Ser: 2.12 mg/dL — ABNORMAL HIGH (ref 0.50–1.35)
GFR calc non Af Amer: 27 mL/min — ABNORMAL LOW (ref >90)

## 2012-03-12 LAB — URINE MICROSCOPIC-ADD ON

## 2012-03-12 MED ORDER — AZELASTINE-FLUTICASONE 137-50 MCG/ACT NA SUSP
2.0000 | Freq: Every day | NASAL | Status: DC
  Administered 2012-03-13: 10:00:00 via NASAL
  Administered 2012-03-14: 2 via NASAL
  Administered 2012-03-16 – 2012-03-17 (×2): via NASAL

## 2012-03-12 MED ORDER — ONDANSETRON HCL 4 MG PO TABS: 4.0000 mg | ORAL_TABLET | Freq: Four times a day (QID) | ORAL | Status: DC | PRN

## 2012-03-12 MED ORDER — CLOPIDOGREL BISULFATE 75 MG PO TABS
75.0000 mg | ORAL_TABLET | Freq: Every day | ORAL | Status: DC
  Administered 2012-03-13 – 2012-03-17 (×5): 75 mg via ORAL
  Filled 2012-03-12 (×5): qty 1

## 2012-03-12 MED ORDER — DEXTROSE 5 % IV SOLN
1.0000 g | INTRAVENOUS | Status: DC
  Administered 2012-03-12 – 2012-03-14 (×3): 1 g via INTRAVENOUS
  Filled 2012-03-12 (×3): qty 10

## 2012-03-12 MED ORDER — ASPIRIN EC 81 MG PO TBEC
81.0000 mg | DELAYED_RELEASE_TABLET | Freq: Every day | ORAL | Status: DC
  Administered 2012-03-12 – 2012-03-17 (×6): 81 mg via ORAL
  Filled 2012-03-12 (×6): qty 1

## 2012-03-12 MED ORDER — ACETAMINOPHEN 325 MG PO TABS
650.0000 mg | ORAL_TABLET | Freq: Four times a day (QID) | ORAL | Status: DC | PRN
  Administered 2012-03-12 – 2012-03-15 (×5): 650 mg via ORAL
  Filled 2012-03-12 (×5): qty 2

## 2012-03-12 MED ORDER — SODIUM CHLORIDE 0.9 % IV SOLN
INTRAVENOUS | Status: AC
  Administered 2012-03-12 – 2012-03-13 (×2): via INTRAVENOUS

## 2012-03-12 MED ORDER — ALBUTEROL SULFATE (5 MG/ML) 0.5% IN NEBU
2.5000 mg | INHALATION_SOLUTION | Freq: Three times a day (TID) | RESPIRATORY_TRACT | Status: DC
  Administered 2012-03-12 – 2012-03-13 (×4): 2.5 mg via RESPIRATORY_TRACT
  Filled 2012-03-12 (×4): qty 0.5

## 2012-03-12 MED ORDER — HYDRALAZINE HCL 20 MG/ML IJ SOLN: 10.0000 mg | INTRAMUSCULAR | Status: DC | PRN

## 2012-03-12 MED ORDER — ACETAMINOPHEN 650 MG RE SUPP: 650.0000 mg | Freq: Four times a day (QID) | RECTAL | Status: DC | PRN

## 2012-03-12 MED ORDER — VITAMIN B-12 1000 MCG PO TABS
1000.0000 ug | ORAL_TABLET | Freq: Every day | ORAL | Status: DC
  Administered 2012-03-12 – 2012-03-17 (×6): 1000 ug via ORAL
  Filled 2012-03-12 (×6): qty 1

## 2012-03-12 MED ORDER — HEPARIN SODIUM (PORCINE) 5000 UNIT/ML IJ SOLN
5000.0000 [IU] | Freq: Three times a day (TID) | INTRAMUSCULAR | Status: DC
  Administered 2012-03-12 – 2012-03-16 (×11): 5000 [IU] via SUBCUTANEOUS
  Filled 2012-03-12 (×14): qty 1

## 2012-03-12 MED ORDER — IPRATROPIUM BROMIDE 0.02 % IN SOLN
0.5000 mg | Freq: Three times a day (TID) | RESPIRATORY_TRACT | Status: DC
  Administered 2012-03-12 – 2012-03-13 (×4): 0.5 mg via RESPIRATORY_TRACT
  Filled 2012-03-12 (×4): qty 2.5

## 2012-03-12 MED ORDER — ATORVASTATIN CALCIUM 40 MG PO TABS: 40.0000 mg | ORAL_TABLET | Freq: Every day | ORAL | Status: DC

## 2012-03-12 MED ORDER — ONDANSETRON HCL 4 MG/2ML IJ SOLN: 4.0000 mg | Freq: Four times a day (QID) | INTRAMUSCULAR | Status: DC | PRN

## 2012-03-12 MED ORDER — PANTOPRAZOLE SODIUM 40 MG PO TBEC
40.0000 mg | DELAYED_RELEASE_TABLET | Freq: Every day | ORAL | Status: DC
  Administered 2012-03-12 – 2012-03-15 (×4): 40 mg via ORAL
  Filled 2012-03-12 (×4): qty 1

## 2012-03-12 MED ORDER — POLYETHYLENE GLYCOL 3350 17 G PO PACK
17.0000 g | PACK | Freq: Every day | ORAL | Status: DC | PRN
  Filled 2012-03-12: qty 1

## 2012-03-12 MED ORDER — SODIUM CHLORIDE 0.9 % IV BOLUS (SEPSIS)
1000.0000 mL | Freq: Once | INTRAVENOUS | Status: AC
  Administered 2012-03-12: 1000 mL via INTRAVENOUS

## 2012-03-12 MED ORDER — ATORVASTATIN CALCIUM 10 MG PO TABS
10.0000 mg | ORAL_TABLET | Freq: Every day | ORAL | Status: DC
  Administered 2012-03-13 – 2012-03-16 (×4): 10 mg via ORAL
  Filled 2012-03-12 (×5): qty 1

## 2012-03-12 MED ORDER — ALBUTEROL SULFATE (5 MG/ML) 0.5% IN NEBU
2.5000 mg | INHALATION_SOLUTION | Freq: Once | RESPIRATORY_TRACT | Status: AC
  Administered 2012-03-12: 2.5 mg via RESPIRATORY_TRACT
  Filled 2012-03-12: qty 1

## 2012-03-12 MED ORDER — NITROGLYCERIN 0.4 MG SL SUBL: 0.4000 mg | SUBLINGUAL_TABLET | SUBLINGUAL | Status: DC | PRN

## 2012-03-12 MED ORDER — SODIUM CHLORIDE 0.9 % IJ SOLN
3.0000 mL | Freq: Two times a day (BID) | INTRAMUSCULAR | Status: DC
  Administered 2012-03-12 – 2012-03-16 (×8): 3 mL via INTRAVENOUS

## 2012-03-12 MED ORDER — IPRATROPIUM BROMIDE 0.02 % IN SOLN
0.5000 mg | Freq: Once | RESPIRATORY_TRACT | Status: AC
  Administered 2012-03-12: 0.5 mg via RESPIRATORY_TRACT
  Filled 2012-03-12: qty 2.5

## 2012-03-12 MED ORDER — SODIUM CHLORIDE 0.9 % IV SOLN
Freq: Once | INTRAVENOUS | Status: AC
  Administered 2012-03-12: 19:00:00 via INTRAVENOUS

## 2012-03-12 MED ORDER — ALPRAZOLAM 0.25 MG PO TABS
0.2500 mg | ORAL_TABLET | Freq: Every evening | ORAL | Status: DC | PRN
  Administered 2012-03-12 – 2012-03-14 (×2): 0.25 mg via ORAL
  Filled 2012-03-12 (×2): qty 1

## 2012-03-12 MED ORDER — BISACODYL 10 MG RE SUPP: 10.0000 mg | Freq: Every day | RECTAL | Status: DC | PRN

## 2012-03-12 MED ORDER — ALBUTEROL SULFATE (5 MG/ML) 0.5% IN NEBU
2.5000 mg | INHALATION_SOLUTION | RESPIRATORY_TRACT | Status: DC | PRN
  Administered 2012-03-14 – 2012-03-17 (×6): 2.5 mg via RESPIRATORY_TRACT
  Filled 2012-03-12 (×8): qty 0.5

## 2012-03-12 MED ORDER — INSULIN ASPART 100 UNIT/ML ~~LOC~~ SOLN
0.0000 [IU] | Freq: Three times a day (TID) | SUBCUTANEOUS | Status: DC
Start: 2012-03-13 — End: 2012-03-17
  Administered 2012-03-13 – 2012-03-14 (×3): 1 [IU] via SUBCUTANEOUS

## 2012-03-12 MED ORDER — BUDESONIDE 0.5 MG/2ML IN SUSP
0.5000 mg | Freq: Two times a day (BID) | RESPIRATORY_TRACT | Status: DC
  Administered 2012-03-13 – 2012-03-17 (×9): 0.5 mg via RESPIRATORY_TRACT
  Filled 2012-03-12 (×13): qty 2

## 2012-03-12 MED ORDER — METOPROLOL TARTRATE 12.5 MG HALF TABLET
12.5000 mg | ORAL_TABLET | Freq: Two times a day (BID) | ORAL | Status: DC
  Administered 2012-03-13 – 2012-03-17 (×9): 12.5 mg via ORAL
  Filled 2012-03-12 (×11): qty 1

## 2012-03-12 NOTE — H&P (Addendum)
[facility] History and Physical  [name] [name] HYQ:[id number] DOB: October 12, 1925 DOA: 03/12/2012  Referring physician:  Dr. Preston [name] PCP:  [name] Housekeeper, MD   Chief Complaint:  Fevers, malaise  HPI:  The patient is a 79 y.o. year-old F with history of CAD, ICM with EF 30% to 35%, HTN, HLD, T2DM, COPD on 2L home O2, anemia, BPH requiring intermittent catheterization who presents with fever and malaise.  The patient was in his normal state of health until 3 days prior to admission.  He called his daughter and complained of abdominal pain that was crampy/bloaty and diffuse.  He was given prilosec, which helped, however, he developed fevers to 101F, chills, and urinary frequency which have persisted for the last two days.  His urine has been cloudy, but no abnl odors.  Denies nausea, vomiting, and diarrhea.  He was constipated so he took laxative.  He has been very thirsty and has had increased shortness of breath.  He denies sinus congestion, increased cough, or changes in sputum.  At baseline, he is [name] [name] of breath walking to the mailbox and back, but the last few days he is SOB walking around his house.  He has continued using his baseline 2L home oxygen.  He was brought to the ER today due to persistent fevers and malaise by his family.   In the ER, labs were notable for acute kidney injury creatinine 2.2 with baseline near 1.2, and UA concerning for UTI.  He has baseline anemia and thrombocytopenia for which he is followed by heme/onc and his anemia is stable, but his platelets are down slightly from baseline.  He is being admitted for further management of acute kidney injury and treatment of urinary tract infection.    Review of Systems:  Fevers and chills as above.  Hearing loss and some vision problems.  Denies sinus congestion, sore throat.  Respiratory symptoms as above.  Denies chest pain or tightness.  Denies N/V/D, blood in stools.  Denies skin rash or ulcer, but has easy bruising.  Denies  focal numbness except in digit 1-3 right hand from carpal tunnel.  No focal weakness.  Pain of the neck when lying down.  Cramps in legs at night.  Denies anxiety and depression.  Gait is very unsteady per family and he had a fall yesterday without LOC or head trauma.    Past Medical History  Diagnosis Date  . CAD (coronary artery disease)   . Ischemic cardiomyopathy   . CHF (congestive heart failure)   . Dyslipidemia   . Diverticulosis of colon (without mention of hemorrhage)   . Diabetes mellitus   . Anemia, unspecified   . Unspecified disorder resulting from impaired renal function   . Chronic airway obstruction, not elsewhere classified     on o2 at night  . Prostatic hypertrophy     hx of  . Retention of urine, unspecified   . ICD (implantable cardiac defibrillator) in place     st jude  . Emphysema   . Hemorrhoids   . MI, old 2010 or 2011  . Macular degeneration   . Cataracts, bilateral    Past Surgical History  Procedure Laterality Date  . Pci      4/11  . Cardiac defibrillator placement  2011    st jude  . Pacemaker insertion  2011  . Tonsillectomy  79 yo  . Cataract extraction     Social History:  reports that he quit smoking about 2 years ago. His smoking  use included Cigarettes. He smoked 0.00 packs per day. He has never used smokeless tobacco. He reports that  drinks alcohol. He reports that he does not use illicit drugs. Lives in a house with stairs, which he needs to use, with his wife.  Wife is not able-bodied.  Does not use a cane or walker, but is unsteady on his feet.    [name] [name]:  [phone] cell, [phone], daughter, POA.   [name] [name]:  [phone], nephew [name] [name]:  [phone], daughter   No Known Allergies  Family History  Problem Relation Age of Onset  . Lung cancer Father     smoker  . Coronary artery disease Mother   . Stroke Mother   . Breast cancer Sister   . Atrial fibrillation Daughter   . Congestive Heart Failure  Mother     Prior to Admission medications   Medication Sig Start Date End Date Taking? Authorizing Provider  ALPRAZolam (XANAX) 0.25 MG tablet Take 0.25 mg by mouth at bedtime as needed.    Yes Historical Provider, MD  aspirin 325 MG tablet Take 325 mg by mouth daily.   Yes Historical Provider, MD  Azelastine-Fluticasone 137-50 MCG/ACT SUSP Place 2 sprays into the nose daily. 02/05/12  Yes Nyoka Cowden, MD  budesonide-formoterol [facility]) 160-4.5 MCG/ACT inhaler Take 2 puffs first thing in am and then another 2 puffs about 12 hours later. 02/05/12  Yes Nyoka Cowden, MD  clopidogrel (PLAVIX) 75 MG tablet Take 1 tablet (75 mg total) by mouth daily. 08/15/11  Yes Wendall Stade, MD  Cyanocobalamin (VITAMIN B 12 PO) Inject as directed.   Yes Historical Provider, MD  losartan (COZAAR) 25 MG tablet Take 1 tablet (25 mg total) by mouth daily. 01/20/12  Yes Wendall Stade, MD  metoprolol tartrate (LOPRESSOR) 25 MG tablet 1/2 po bid   Yes Historical Provider, MD  nitroGLYCERIN (NITROSTAT) 0.4 MG SL tablet Place 1 tablet (0.4 mg total) under the tongue every 5 (five) minutes as needed for chest pain. 09/15/11 09/14/12 Yes Wendall Stade, MD  omeprazole (PRILOSEC) 20 MG capsule Take 20 mg by mouth daily.   Yes Historical Provider, MD  rosuvastatin (CRESTOR) 20 MG tablet Take 5 mg by mouth daily. 08/15/11  Yes Wendall Stade, MD   Physical Exam: Filed Vitals:   03/12/12 1154 03/12/12 1156 03/12/12 1537 03/12/12 1633  BP: 128/58   120/53  Pulse: 79   82  Temp: 98.9 F (37.2 C)   98.3 F (36.8 C)  TempSrc: Oral   Oral  Resp: 24   20  SpO2:  100% 99% 93%     General:  CM, no acute distress, lying on stretcher  Eyes:  PERRL, anicteric, non-injected.  Reflection from left cataract replacement.    ENT:  OP clear, non-erythematous without plaques or exudates.  MMM.  Neck:  Supple without TM or JVD.  Lymph:  No cervical, supraclavicular, or submandibular LAD.  Cardiovascular:  RRR without  m/r/g.  Respiratory:   Diminished bilateral breath sounds at the bases with high pitched full expiratory wheeze.  No focal rales or rhonchi.  No increased WOB.  Abdomen:  NABS.  Soft, ND/NT.  Skin:  No rashes or focal lesions.    Musculoskeletal:  Normal bulk and tone.  No LE edema.  Psychiatric:  A & O x 4.  Appropriate affect.  Neurologic:  CN 3-12 intact except for hearing deficit.  5/5 strength.  Sensation intact on arms and legs.  Labs on Admission:  Basic Metabolic Panel:  Recent Labs Lab 03/12/12 1530  NA 133*  K 4.2  CL 95*  CO2 25  GLUCOSE 98  BUN 54*  CREATININE 2.22*  CALCIUM 8.3*   Liver Function Tests: No results found for this basename: AST, ALT, ALKPHOS, BILITOT, PROT, ALBUMIN,  in the last 168 hours No results found for this basename: LIPASE, AMYLASE,  in the last 168 hours No results found for this basename: AMMONIA,  in the last 168 hours CBC:  Recent Labs Lab 03/12/12 1530  WBC 9.8  NEUTROABS 8.6*  HGB 9.3*  HCT 28.7*  MCV 66.0*  PLT 82*   Cardiac Enzymes: No results found for this basename: CKTOTAL, CKMB, CKMBINDEX, TROPONINI,  in the last 168 hours  BNP (last 3 results)  Recent Labs  10/07/11 1657 12/08/11 1043  PROBNP 19.0 45.0   CBG: No results found for this basename: GLUCAP,  in the last 168 hours  Radiological Exams on Admission: Dg Chest 2 View  03/12/2012  *RADIOLOGY REPORT*  Clinical Data: Dyspnea.  Former smoker.  CHEST - 2 VIEW  Comparison: 12/08/2011.  Findings: Dual lead transvenous pacemaker is in place with generator on the left.  Cardiac silhouette appears small in relation to the hyperinflation.  There is ectasia of the thoracic aorta.  Aneurysmal dilatation cannot be excluded.  Largest measurement which could be obtained on lateral image is 4.2 cm. There has been chronic ectasia and enlargement of the aortic arch.  There is a generalized hyperinflation configuration consistent with COPD with areas of emphysematous  change and fibrotic change.  On the PA image a small nodular density projects between the anterior aspect of the right sixth and seventh ribs.  This could be a nipple density but pulmonary nodular density cannot be excluded.  On the lateral image a vague area of nodularity projects over the mid thoracic vertebral body.  This area was not definitely visualized on previous studies and measures 1.6 x 1.2 cm.  There is also a retrocardiac nodular density on the lateral image which could be vascular but I cannot definitely visualize it on prior studies.  This nodular density measures 1.8 x 1.3 cm in diameter.  No pleural effusion is seen.  There is some fibrotic change within the lungs seen on lateral image.  There is osteopenic appearance of the bones.  Changes of degenerative disc disease and degenerative spondylosis are seen.  IMPRESSION: Transvenous pacemaker in place.  Prominent ectatic thoracic aorta. Aneurysmal dilatation cannot be excluded.  Hyperinflation configuration consistent with COPD with areas of emphysematous change and fibrotic change.  On the PA image a small nodular density projects between the anterior aspect of the right sixth and seventh ribs.  This could be a nipple density but pulmonary nodular density cannot be excluded. On the lateral image a vague area of nodularity projects over the mid thoracic vertebral body.  This area was not definitely visualized on previous studies and measures 1.6 x 1.2 cm.  There is also a retrocardiac nodular density on the lateral image which could be vascular but I cannot definitely visualize it on prior studies.  This nodular density measures 1.8 x 1.3 cm in diameter.  These areas may have benign etiologies as discussed above but the appearance is different than on previous radiographic examinations. Small pulmonary masses or nodules cannot be excluded. These areas could be further characterized by thoracic CT.   Original Report Authenticated By: Onalee Hua Call  Assessment/Plan Principal Problem:   Acute on chronic renal insufficiency Active Problems:   DIABETES MELLITUS, TYPE II   DYSLIPIDEMIA   Anemia of chronic disease   CAD   Asthma vs vcd   Chronic kidney disease, stage 3   URINARY RETENTION   BENIGN PROSTATIC HYPERTROPHY, HX OF   CHF (congestive heart failure)   MGUS (monoclonal gammopathy of unknown significance)   Thrombocytopenia   Beta thalassemia trait   UTI (lower urinary tract infection)  Acute on chronic kidney injury, likely prerenal from poor PO intake and high fevers/insensible losses the last few days -  Gentle hydration and stop time in AM to prevent decompensation of heart failure -  Repeat electrolytes in the morning  Complicated UTI/BPH:  Suspect pyelonephritis given fevers and abdominal pain despite the lack of flank pain -  F/u urine culture -  Blood cultures x 2 (was already given ceftriaxone in ER) -  Continue ceftriaxone -  Will place foley to completely decompress the bladder for now to avoid intermittent catheterizations while in-house overnight.   Gait instability/fall at home -  PT consultation -  Encouraged patient to not drive anymore  COPD with mild increase in SOB -  Duonebs TID -  Albuterol q2prn -  Budesonide  -  Hold symbicort for now -  Continue 2L [place] -  Consider systemic steroids if worsens -  Already on ceftriaxone for UTI  Pulmonary nodules on CXR should be further evaluated by CT -  Will defer tonight as patient is feeling unwell  CAD/ICM/HTN/HLD:  Stable, no chest pains or signs of hypervolemia.  -  Continue ASA/plavix/selective BB/statin -  Hold ARB in setting of AKI -  Consideration of spironolactone when not attempting to hydrate -  Start prn hydralazine   T2DM:  -  Low dose SSI  MGUS.  Anemia, AKI, and worsening thrombocytopenia may be signs of progression of disease, although less likely -  Consider repeating SPEP/IFE/B2MG N if kidney function not improving.   -   Repeat CBC in AM.    Neutrophilia suggestive of bacterial infection despite normal WBC.    Anemia of beta thalassemia trait/B12 deficiency/chronic disease -  Pt has not been compliant with B12, encouraged compliance -  Tx for hgb < 7  Thrombocytopenia, plt decreased from baseline, may be due to acute infection.  No active bleeding -  trend  Hyponatremia may be due to urinary outflow obstruction, AKI.   -  Hydrate, place foley and repeat in AM.  Gait instability with recent fall.  Fall likely orthostatic and due to acute illness yesterday.  No apparent injuries -  PT consultation  Diet:  Diabetic  Access:  PIV IVF:  NS at 86ml/h until 8AM, then stop Proph:  heparin  Code Status: full code Family Communication: spoke with patient and his daughter and nephew.  Contact numbers under social history Disposition Plan:  PT evaluation.  Admit to telemetry  Time spent: 70 min  Renae Fickle [facility] Pager [phone]  If 7PM-7AM, please contact night-coverage [email or web address] Password [facility] 03/12/2012, 6:33 PM

## 2012-03-12 NOTE — ED Notes (Signed)
Gave pt urinal to void 

## 2012-03-12 NOTE — ED Notes (Signed)
Per patient, having chills, fever, no congestion-body aches

## 2012-03-12 NOTE — ED Provider Notes (Signed)
History     CSN: [id number]  Arrival date & time 03/12/12  1146   First MD Initiated Contact with Patient 03/12/12 1453      Chief Complaint  Patient presents with  . flu like symptoms     (Consider location/radiation/quality/duration/timing/severity/associated sxs/prior treatment) The history is provided by the patient and a relative.   79 year old male has had a fever and chills for the last 5-6 days. Fevers has been as high as 101. He has had associated dyspnea but no cough. He has also had urinary frequency. he denies dysuria. He normally uses a catheter to pass urine. There's been no nausea vomiting or diarrhea. He denies arthralgias or myalgias. He has been using his inhaler with little relief of his dyspnea. Nothing has made his symptoms better nothing has made them worse.  Past Medical History  Diagnosis Date  . CAD (coronary artery disease)   . Ischemic cardiomyopathy   . CHF (congestive heart failure)   . Dyslipidemia   . Diverticulosis of colon (without mention of hemorrhage)   . Diabetes mellitus   . Anemia, unspecified   . Unspecified disorder resulting from impaired renal function   . Chronic airway obstruction, not elsewhere classified     on o2 at night  . Prostatic hypertrophy     hx of  . Retention of urine, unspecified   . ICD (implantable cardiac defibrillator) in place     st jude  . Emphysema     Past Surgical History  Procedure Laterality Date  . Pci      4/11  . Cardiac defibrillator placement      st jude    Family History  Problem Relation Age of Onset  . Cancer Father     family history  . Coronary artery disease    . Stroke    . Cancer Sister     History  Substance Use Topics  . Smoking status: Former Smoker    Types: Cigarettes    Quit date: 04/27/2009  . Smokeless tobacco: Never Used  . Alcohol Use: Yes      Review of Systems  All other systems reviewed and are negative.    Allergies  Review of patient's allergies  indicates no known allergies.  Home Medications   Current Outpatient Rx  Name  Route  Sig  Dispense  Refill  . ALPRAZolam (XANAX) 0.25 MG tablet   Oral   Take 0.25 mg by mouth at bedtime as needed.          Marland Kitchen aspirin 325 MG tablet   Oral   Take 325 mg by mouth daily.         . Azelastine-Fluticasone 137-50 MCG/ACT SUSP   Nasal   Place 2 sprays into the nose daily.   23 g   3   . budesonide-formoterol (SYMBICORT) 160-4.5 MCG/ACT inhaler      Take 2 puffs first thing in am and then another 2 puffs about 12 hours later.   1 Inhaler   11   . clopidogrel (PLAVIX) 75 MG tablet   Oral   Take 1 tablet (75 mg total) by mouth daily.   30 tablet   12   . Cyanocobalamin (VITAMIN B 12 PO)   Injection   Inject as directed.         Marland Kitchen losartan (COZAAR) 25 MG tablet   Oral   Take 1 tablet (25 mg total) by mouth daily.   30 tablet   11   .  metoprolol tartrate (LOPRESSOR) 25 MG tablet      1/2 po bid         . nitroGLYCERIN (NITROSTAT) 0.4 MG SL tablet   Sublingual   Place 1 tablet (0.4 mg total) under the tongue every 5 (five) minutes as needed for chest pain.   25 tablet   4   . omeprazole (PRILOSEC) 20 MG capsule   Oral   Take 20 mg by mouth daily.         . rosuvastatin (CRESTOR) 20 MG tablet   Oral   Take 5 mg by mouth daily.           BP 128/58  Pulse 79  Temp(Src) 98.9 F (37.2 C) (Oral)  Resp 24  SpO2 100%  Physical Exam  Nursing note and vitals reviewed.  79 year old male, who appears mildly dyspneic, but is in no acute distress. Vital signs are significant for tachypnea with respiratory rate of 24. Oxygen saturation is 100%, which is normal. Head is normocephalic and atraumatic. PERRLA, EOMI. Oropharynx is clear. Neck is nontender and supple without adenopathy or JVD. Back is nontender and there is no CVA tenderness. Lungs have decreased air movement diffusely with faint wheezing in the upper lobes bilaterally. There are no rales or  rhonchi. Chest is nontender. Heart has regular rate and rhythm without murmur. Abdomen is soft, flat, nontender without masses or hepatosplenomegaly and peristalsis is normoactive. Extremities have no cyanosis or edema, full range of motion is present. Skin is warm and dry without rash. Neurologic: Mental status is normal, cranial nerves are intact, there are no motor or sensory deficits.  ED Course  Procedures (including critical care time)  Results for orders placed during the hospital encounter of 03/12/12  URINALYSIS, ROUTINE W REFLEX MICROSCOPIC      Result Value Range   Color, Urine YELLOW  YELLOW   APPearance TURBID (*) CLEAR   Specific Gravity, Urine 1.016  1.005 - 1.030   pH 5.5  5.0 - 8.0   Glucose, UA NEGATIVE  NEGATIVE mg/dL   Hgb urine dipstick LARGE (*) NEGATIVE   Bilirubin Urine NEGATIVE  NEGATIVE   Ketones, ur NEGATIVE  NEGATIVE mg/dL   Protein, ur 161 (*) NEGATIVE mg/dL   Urobilinogen, UA 1.0  0.0 - 1.0 mg/dL   Nitrite NEGATIVE  NEGATIVE   Leukocytes, UA LARGE (*) NEGATIVE  CBC WITH DIFFERENTIAL      Result Value Range   WBC 9.8  4.0 - 10.5 K/uL   RBC 4.35  4.22 - 5.81 MIL/uL   Hemoglobin 9.3 (*) 13.0 - 17.0 g/dL   HCT 09.6 (*) 04.5 - 40.9 %   MCV 66.0 (*) 78.0 - 100.0 fL   MCH 21.4 (*) 26.0 - 34.0 pg   MCHC 32.4  30.0 - 36.0 g/dL   RDW 81.1 (*) 91.4 - 78.2 %   Platelets 82 (*) 150 - 400 K/uL   Neutrophils Relative 89 (*) 43 - 77 %   Neutro Abs 8.6 (*) 1.7 - 7.7 K/uL   Lymphocytes Relative 5 (*) 12 - 46 %   Lymphs Abs 0.5 (*) 0.7 - 4.0 K/uL   Monocytes Relative 7  3 - 12 %   Monocytes Absolute 0.7  0.1 - 1.0 K/uL   Eosinophils Relative 0  0 - 5 %   Eosinophils Absolute 0.0  0.0 - 0.7 K/uL   Basophils Relative 0  0 - 1 %   Basophils Absolute 0.0  0.0 - 0.1  K/uL   WBC Morphology INCREASED BANDS (>20% BANDS)     RBC Morphology POLYCHROMASIA PRESENT     Smear Review LARGE PLATELETS PRESENT    BASIC METABOLIC PANEL      Result Value Range   Sodium 133  (*) 135 - 145 mEq/L   Potassium 4.2  3.5 - 5.1 mEq/L   Chloride 95 (*) 96 - 112 mEq/L   CO2 25  19 - 32 mEq/L   Glucose, Bld 98  70 - 99 mg/dL   BUN 54 (*) 6 - 23 mg/dL   Creatinine, Ser 1.61 (*) 0.50 - 1.35 mg/dL   Calcium 8.3 (*) 8.4 - 10.5 mg/dL   GFR calc non Af Amer 26 (*) >90 mL/min   GFR calc Af Amer 30 (*) >90 mL/min  URINE MICROSCOPIC-ADD ON      Result Value Range   Squamous Epithelial / LPF FEW (*) RARE   WBC, UA TOO NUMEROUS TO COUNT  <3 WBC/hpf   RBC / HPF 11-20  <3 RBC/hpf   Bacteria, UA MANY (*) RARE   Dg Chest 2 View  03/12/2012  *RADIOLOGY REPORT*  Clinical Data: Dyspnea.  Former smoker.  CHEST - 2 VIEW  Comparison: 12/08/2011.  Findings: Dual lead transvenous pacemaker is in place with generator on the left.  Cardiac silhouette appears small in relation to the hyperinflation.  There is ectasia of the thoracic aorta.  Aneurysmal dilatation cannot be excluded.  Largest measurement which could be obtained on lateral image is 4.2 cm. There has been chronic ectasia and enlargement of the aortic arch.  There is a generalized hyperinflation configuration consistent with COPD with areas of emphysematous change and fibrotic change.  On the PA image a small nodular density projects between the anterior aspect of the right sixth and seventh ribs.  This could be a nipple density but pulmonary nodular density cannot be excluded.  On the lateral image a vague area of nodularity projects over the mid thoracic vertebral body.  This area was not definitely visualized on previous studies and measures 1.6 x 1.2 cm.  There is also a retrocardiac nodular density on the lateral image which could be vascular but I cannot definitely visualize it on prior studies.  This nodular density measures 1.8 x 1.3 cm in diameter.  No pleural effusion is seen.  There is some fibrotic change within the lungs seen on lateral image.  There is osteopenic appearance of the bones.  Changes of degenerative disc disease and  degenerative spondylosis are seen.  IMPRESSION: Transvenous pacemaker in place.  Prominent ectatic thoracic aorta. Aneurysmal dilatation cannot be excluded.  Hyperinflation configuration consistent with COPD with areas of emphysematous change and fibrotic change.  On the PA image a small nodular density projects between the anterior aspect of the right sixth and seventh ribs.  This could be a nipple density but pulmonary nodular density cannot be excluded. On the lateral image a vague area of nodularity projects over the mid thoracic vertebral body.  This area was not definitely visualized on previous studies and measures 1.6 x 1.2 cm.  There is also a retrocardiac nodular density on the lateral image which could be vascular but I cannot definitely visualize it on prior studies.  This nodular density measures 1.8 x 1.3 cm in diameter.  These areas may have benign etiologies as discussed above but the appearance is different than on previous radiographic examinations. Small pulmonary masses or nodules cannot be excluded. These areas could be further characterized  by thoracic CT.   Original Report Authenticated By: Onalee Hua Call       1. Urinary tract infection   2. Renal insufficiency   3. Thrombocytopenia   4. Anemia       MDM  Fever and chills which could be related to influenza, and possible pneumonia, possible urinary tract infection. Chest x-ray will be obtained as well as urinalysis and will be given albuterol with Atrovent nebulizer treatment. Of note, if he does have influenza, he is outside the treatment window for antiviral therapy.  Workup is significant for evidence of urinary tract infection and worsening of creatinine. There are is little change from baseline thrombocytopenia and anemia. WBC is not elevated but there is a left shift. He is given an IV fluid bolus and started on Rocephin. Case is discussed with Dr. Malachi Bonds of [facility] who agrees to admit the patient. Of note, his  breathing seems better following albuterol with Atrovent.      Dione Booze, MD 03/12/12 [phone]

## 2012-03-12 NOTE — ED Notes (Signed)
Report called to 5th floor

## 2012-03-12 NOTE — ED Notes (Signed)
Patient cathed for of cloudy urine.

## 2012-03-12 NOTE — ED Notes (Signed)
Patient's IV infiltrated.  IV restarted.

## 2012-03-13 LAB — BASIC METABOLIC PANEL
BUN: 46 mg/dL — ABNORMAL HIGH (ref 6–23)
Creatinine, Ser: 1.91 mg/dL — ABNORMAL HIGH (ref 0.50–1.35)
GFR calc Af Amer: 35 mL/min — ABNORMAL LOW (ref >90)
GFR calc non Af Amer: 31 mL/min — ABNORMAL LOW (ref >90)
Potassium: 3.8 mEq/L (ref 3.5–5.1)

## 2012-03-13 LAB — CBC
MCHC: 32.1 g/dL (ref 30.0–36.0)
RDW: 17 % — ABNORMAL HIGH (ref 11.5–15.5)

## 2012-03-13 LAB — GLUCOSE, CAPILLARY
Glucose-Capillary: 97 mg/dL (ref 70–99)
Glucose-Capillary: 116 mg/dL — ABNORMAL HIGH (ref 70–99)
Glucose-Capillary: 140 mg/dL — ABNORMAL HIGH (ref 70–99)

## 2012-03-13 MED ORDER — SODIUM CHLORIDE 0.45 % IV SOLN
INTRAVENOUS | Status: DC
  Administered 2012-03-13: 13:00:00 via INTRAVENOUS

## 2012-03-13 MED ORDER — CYCLOBENZAPRINE HCL 5 MG PO TABS
5.0000 mg | ORAL_TABLET | Freq: Three times a day (TID) | ORAL | Status: DC | PRN
  Administered 2012-03-13 – 2012-03-16 (×2): 5 mg via ORAL
  Filled 2012-03-13 (×3): qty 1

## 2012-03-13 NOTE — Progress Notes (Signed)
PT Cancellation Note  Patient Details Name: Antoino Westhoff MRN: [id number] DOB: 19-Nov-1925   Cancelled Treatment:    Reason Eval/Treat Not Completed: Fatigue/lethargy limiting ability to participate; patient wished to wait until tomorrow to walk; family states just fatigued from getting over fevers and not sleeping well.  Will try back tomorrow.   WYNN,CYNDI 03/13/2012, 4:18 PM

## 2012-03-13 NOTE — Progress Notes (Signed)
Pt resting with family at bedside.  Plan of care discussed with daughter Judeth Cornfield.  She also requested pt receive tylenol to keep fever down overnight. Will continue to monitor.

## 2012-03-13 NOTE — Progress Notes (Signed)
Subjective: [name] [name] is an 79 y/o with multiple medical problems admitted with acute renal insufficiency and UTI for IV antibiotics and hydration. H&P reviewed.   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  Physician roster: Eden Emms - cardiology, Wert - Pulmonary   Objective: Lab: Lab Results  Component Value Date   WBC 7.0 03/13/2012   HGB 8.0* 03/13/2012   HCT 24.9* 03/13/2012   MCV 66.4* 03/13/2012   PLT 60* 03/13/2012   BMET    Component Value Date/Time   NA 137 03/13/2012 0535   K 3.8 03/13/2012 0535   CL 103 03/13/2012 0535   CO2 23 03/13/2012 0535   GLUCOSE 108* 03/13/2012 0535   BUN 46* 03/13/2012 0535   CREATININE 1.91* 03/13/2012 0535   CALCIUM 7.6* 03/13/2012 0535   GFRNONAA 31* 03/13/2012 0535   GFRAA 35* 03/13/2012 0535     Imaging:  Scheduled Meds: . ipratropium  0.5 mg Nebulization TID   And  . albuterol  2.5 mg Nebulization TID  . aspirin EC  81 mg Oral Daily  . atorvastatin  10 mg Oral q1800  . Azelastine-Fluticasone  2 spray Nasal Daily  . budesonide (PULMICORT) nebulizer solution  0.5 mg Nebulization BID  . cefTRIAXone (ROCEPHIN) IVPB 1 gram/50 mL D5W  1 g Intravenous Q24H  . clopidogrel  75 mg Oral Daily  . heparin  5,000 Units Subcutaneous Q8H  . insulin aspart  0-9 Units Subcutaneous TID WC  . metoprolol tartrate  12.5 mg Oral BID  . pantoprazole  40 mg Oral Daily  . sodium chloride  3 mL Intravenous Q12H  . vitamin B-12  1,000 mcg Oral Daily   Continuous Infusions:  PRN Meds:.acetaminophen, acetaminophen, albuterol, ALPRAZolam, bisacodyl, hydrALAZINE, nitroGLYCERIN, ondansetron (ZOFRAN) IV, ondansetron, polyethylene glycol   Physical Exam: Filed Vitals:   03/13/12 1015  BP: 111/52  Pulse: 78  Temp: 99.3 F (37.4 C)  Resp: 20    Intake/Output Summary (Last 24 hours) at 03/13/12 1131 Last data filed at 03/13/12 0630  Gross per 24 hour  Intake  832.5 ml  Output    750 ml  Net   82.5 ml    Gen'l - elderly white man who looks older than his stated age. Short of breath at rest. HEENT- C&S clear Cor - RRR, no JVD PUlm - increased WOB at rest, SOB with talking, no wheezing Abd- BS+, soft Neuro - A&O x 3.       Assessment/Plan: 1. ID - patient with UTI. WBC normal. No fever. Very positive U/A. Urine and blood cultures pending. Day #2 Rocephin.  Plan  Continue rocephin pending culture results   2. Renal - acute on chronic renal insufficiency. Showing improvement with hydration: Creatinine 2.2 to 1.9  Plan  Continue hydration at 50 cc/hr  3. PUlm - advanced, O2 dependent COPD/emphysema. Continuing home regimen. Recommendation for CT chest noted - will delay until renal function is stable.   Plan Continue home regimen  4. Cardiovascular - stable.   Plan - d/c/ telementry  5. Heme/Onc - thalassemia trait with B12 deficiency. Appears to be stable. 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 Dispo - Needs PT evaluation. Plan is to return home.      Illene Regulus [place] [phone]) [phone]; (c) [phone] Call-grp - [facility]  Tele: [phone]  03/13/2012, 11:15 AM

## 2012-03-13 NOTE — Progress Notes (Signed)
Pt complaining of back cramps. MD notified. Gave order for flexeril. Vw,rn.

## 2012-03-13 NOTE — Progress Notes (Signed)
UR completed 

## 2012-03-14 DIAGNOSIS — D696 Thrombocytopenia, unspecified: Secondary | ICD-10-CM

## 2012-03-14 DIAGNOSIS — R0989 Other specified symptoms and signs involving the circulatory and respiratory systems: Secondary | ICD-10-CM | POA: Diagnosis not present

## 2012-03-14 DIAGNOSIS — N12 Tubulo-interstitial nephritis, not specified as acute or chronic: Secondary | ICD-10-CM

## 2012-03-14 DIAGNOSIS — J45909 Unspecified asthma, uncomplicated: Secondary | ICD-10-CM | POA: Diagnosis not present

## 2012-03-14 DIAGNOSIS — E119 Type 2 diabetes mellitus without complications: Secondary | ICD-10-CM

## 2012-03-14 DIAGNOSIS — N289 Disorder of kidney and ureter, unspecified: Secondary | ICD-10-CM | POA: Diagnosis not present

## 2012-03-14 LAB — BASIC METABOLIC PANEL
BUN: 37 mg/dL — ABNORMAL HIGH (ref 6–23)
Calcium: 7.7 mg/dL — ABNORMAL LOW (ref 8.4–10.5)
Creatinine, Ser: 1.66 mg/dL — ABNORMAL HIGH (ref 0.50–1.35)
GFR calc Af Amer: 42 mL/min — ABNORMAL LOW (ref >90)
GFR calc non Af Amer: 36 mL/min — ABNORMAL LOW (ref >90)

## 2012-03-14 LAB — GLUCOSE, CAPILLARY

## 2012-03-14 MED ORDER — ALBUTEROL SULFATE (5 MG/ML) 0.5% IN NEBU
2.5000 mg | INHALATION_SOLUTION | Freq: Four times a day (QID) | RESPIRATORY_TRACT | Status: DC
  Administered 2012-03-14 – 2012-03-15 (×6): 2.5 mg via RESPIRATORY_TRACT
  Filled 2012-03-14 (×6): qty 0.5

## 2012-03-14 MED ORDER — IPRATROPIUM BROMIDE 0.02 % IN SOLN
0.5000 mg | Freq: Four times a day (QID) | RESPIRATORY_TRACT | Status: DC
  Administered 2012-03-14 – 2012-03-16 (×10): 0.5 mg via RESPIRATORY_TRACT
  Filled 2012-03-14 (×11): qty 2.5

## 2012-03-14 MED ORDER — ALPRAZOLAM 0.25 MG PO TABS
0.2500 mg | ORAL_TABLET | Freq: Four times a day (QID) | ORAL | Status: DC | PRN
  Administered 2012-03-15: 0.25 mg via ORAL
  Filled 2012-03-14 (×2): qty 1

## 2012-03-14 NOTE — Plan of Care (Signed)
Problem: Phase I Progression Outcomes Goal: Pain controlled with appropriate interventions Outcome: Progressing Has not needed pain medication today.

## 2012-03-14 NOTE — Plan of Care (Signed)
Problem: Phase I Progression Outcomes Goal: Voiding-avoid urinary catheter unless indicated Outcome: Not Progressing Has foley catheter.

## 2012-03-14 NOTE — Progress Notes (Signed)
Subjective: C/o episode of SOb last night. Has had some back discomfort.  Objective: Lab: Lab Results  Component Value Date   WBC 7.0 03/13/2012   HGB 8.0* 03/13/2012   HCT 24.9* 03/13/2012   MCV 66.4* 03/13/2012   PLT 60* 03/13/2012   BMET    Component Value Date/Time   NA 136 03/14/2012 0532   K 4.5 03/14/2012 0532   CL 103 03/14/2012 0532   CO2 23 03/14/2012 0532   GLUCOSE 122* 03/14/2012 0532   BUN 37* 03/14/2012 0532   CREATININE 1.66* 03/14/2012 0532   CALCIUM 7.7* 03/14/2012 0532   GFRNONAA 36* 03/14/2012 0532   GFRAA 42* 03/14/2012 0532     Imaging: No new imaging  Scheduled Meds: . albuterol  2.5 mg Nebulization QID  . aspirin EC  81 mg Oral Daily  . atorvastatin  10 mg Oral q1800  . Azelastine-Fluticasone  2 spray Nasal Daily  . budesonide (PULMICORT) nebulizer solution  0.5 mg Nebulization BID  . cefTRIAXone (ROCEPHIN) IVPB 1 gram/50 mL D5W  1 g Intravenous Q24H  . clopidogrel  75 mg Oral Daily  . heparin  5,000 Units Subcutaneous Q8H  . insulin aspart  0-9 Units Subcutaneous TID WC  . ipratropium  0.5 mg Nebulization QID  . metoprolol tartrate  12.5 mg Oral BID  . pantoprazole  40 mg Oral Daily  . sodium chloride  3 mL Intravenous Q12H  . vitamin B-12  1,000 mcg Oral Daily   Continuous Infusions: . sodium chloride 50 mL/hr at 03/13/12 1322   PRN Meds:.acetaminophen, acetaminophen, albuterol, ALPRAZolam, bisacodyl, cyclobenzaprine, hydrALAZINE, nitroGLYCERIN, ondansetron (ZOFRAN) IV, ondansetron, polyethylene glycol   Physical Exam: Filed Vitals:   03/14/12 1000  BP: 116/64  Pulse:   Temp:   Resp:   eldelry greek man in no distress but has noticeable increased WOB at rest Cor- heart sounds distant but regular Pulm - increased WOB, no wheezing Abd - soft Neuro - A&O x 3       Assessment/Plan: 1. ID - pyelonephritis. No fever. Day #3 Rocephin. Ucx NGTD, blood Cx NGTD Plan continue Rocephin today.   2. Renal creatinine 2.2-1.9-1.66 Plan Continue  low rate IVF  AM lab  3. Pulm - No real change. Patient will need Pulmonary follow up as outpatient with f/u CXR at that time. Plan Continue present regimen  4. Cardiac - stable  5. Heme/onc  Stable  6. Dispo - PT eval noted: HHPT and 24 hr supervision vs SNF. Patient definitely prefers home. Planted seed for him to consider congregate living.!!    Illene Regulus [place] [facility] (o) [phone]; (c) [phone] Call-grp - [facility]  Tele: [phone]  03/14/2012, 2:02 PM

## 2012-03-14 NOTE — Evaluation (Signed)
Physical Therapy Evaluation Patient Details Name: [name] [name] MRN: [id number] DOB: [birth year] Today's Date: 03/14/2012 Time: 4098-1191 PT Time Calculation (min): 16 min  PT Assessment / Plan / Recommendation Clinical Impression  Pt presents with acute on chronic renal insufficiency and UTI with history of COPD (son states only wears O2 when SOB) CAD, ICM, and HTN.  Tolerated ambulation in hallway at min assist on 2L O2 with SaO2 at 94-95% during ambulation.  Noted pt to be unsteady and requires cues/assist for negotiating RW due to tendency to push it too far ahead of him.  Pt will benefit from skilled PT in acute venue to address deficits.  PT recommends ST SNF vs HHPT pending pt progress and family ability to provide 24/7 supervision/assist.     PT Assessment  Patient needs continued PT services    Follow Up Recommendations  Home health PT;SNF;Supervision/Assistance - 24 hour    Does the patient have the potential to tolerate intense rehabilitation      Barriers to Discharge Decreased caregiver support      Equipment Recommendations  None recommended by PT    Recommendations for Other Services OT consult   Frequency Min 3X/week    Precautions / Restrictions Precautions Precautions: Fall Precaution Comments: monitor O2 Restrictions Weight Bearing Restrictions: No   Pertinent Vitals/Pain No pain      Mobility  Bed Mobility Bed Mobility: Not assessed Details for Bed Mobility Assistance: Pt in recliner when PT arrived.  Transfers Transfers: Sit to Stand;Stand to Sit Sit to Stand: 4: Min assist;With upper extremity assist;With armrests;From chair/3-in-1 Stand to Sit: 4: Min assist;4: Min guard;With upper extremity assist;With armrests;To chair/3-in-1 Details for Transfer Assistance: Assist to rise and steady with max cues for hand placement, esp when standing as he stood impulsively and pulling on RW.  Ambulation/Gait Ambulation/Gait Assistance: 4: Min  assist Ambulation Distance (Feet): 150 Feet Assistive device: Rolling walker Ambulation/Gait Assistance Details: Assist to steady throughout with cues for maintaining position inside of RW and to maintain upright posture.  Ambulated on 2L O2 with SaO2 at 94-95% during ambulation.   Gait Pattern: Step-through pattern;Decreased stride length;Trunk flexed Gait velocity: decreased Stairs: No Wheelchair Mobility Wheelchair Mobility: No    Exercises     PT Diagnosis: Difficulty walking;Generalized weakness  PT Problem List: Decreased strength;Decreased activity tolerance;Decreased balance;Decreased mobility;Decreased coordination;Decreased knowledge of use of DME;Decreased safety awareness;Cardiopulmonary status limiting activity PT Treatment Interventions: DME instruction;Gait training;Stair training;Functional mobility training;Therapeutic activities;Therapeutic exercise;Balance training;Patient/family education   PT Goals Acute Rehab PT Goals PT Goal Formulation: With patient/family Time For Goal Achievement: 03/28/12 Potential to Achieve Goals: Good Pt will go Supine/Side to Sit: with supervision PT Goal: Supine/Side to Sit - Progress: Goal set today Pt will go Sit to Supine/Side: with supervision PT Goal: Sit to Supine/Side - Progress: Goal set today Pt will go Sit to Stand: with supervision PT Goal: Sit to Stand - Progress: Goal set today Pt will Ambulate: >150 feet;with supervision;with least restrictive assistive device PT Goal: Ambulate - Progress: Goal set today Pt will Go Up / Down Stairs: 3-5 stairs;with supervision;with least restrictive assistive device;with rail(s) PT Goal: Up/Down Stairs - Progress: Goal set today  Visit Information  Last PT Received On: 03/14/12 Assistance Needed: +1 (+2 helpful for O2)    Subjective Data  Subjective: I feel ok. Patient Stated Goal: to return home.   Prior Functioning  Home Living Lives With: Spouse Available Help at Discharge:  Family;Available 24 hours/day;Available PRN/intermittently Type of Home: House Home Access: Level  entry Entrance Stairs-Number of Steps: from garage on bottom floor; 4-5 from front entrance they don't use Home Layout: Two level Alternate Level Stairs-Number of Steps: flight from bottom level entry or 5 from front entry Alternate Level Stairs-Rails: Right Bathroom Shower/Tub: Walk-in shower ("sit-in shower") Home Adaptive Equipment: Walker - rolling Prior Function Able to Take Stairs?: Yes Comments: Pt states he has an aide come in 3 days a week for approx 5 hours at a time.  Communication Communication: No difficulties    Cognition  Cognition Overall Cognitive Status: Appears within functional limits for tasks assessed/performed Arousal/Alertness: Awake/alert Orientation Level: Appears intact for tasks assessed Behavior During Session: [facility] for tasks performed    Extremity/Trunk Assessment Right Lower Extremity Assessment RLE ROM/Strength/Tone: WFL for tasks assessed RLE Sensation: WFL - Light Touch Left Lower Extremity Assessment LLE ROM/Strength/Tone: WFL for tasks assessed LLE Sensation: WFL - Light Touch Trunk Assessment Trunk Assessment: Kyphotic   Balance    End of Session PT - End of Session Equipment Utilized During Treatment: Gait belt;Oxygen Activity Tolerance: Patient tolerated treatment well;Patient limited by fatigue Patient left: in chair;with call bell/phone within reach;with family/visitor present Nurse Communication: Mobility status  GP     Vista Deck 03/14/2012, 12:09 PM

## 2012-03-14 NOTE — Progress Notes (Signed)
Pt coughing up moderate amounts of thick sputum. PT ambulated pt. With a front wheel walker, tolerated well. Pt needs much encouragement to get him out of bed. He sat up in chair for about 2 hours.

## 2012-03-14 NOTE — Plan of Care (Signed)
Problem: Phase I Progression Outcomes Goal: OOB as tolerated unless otherwise ordered Outcome: Progressing Ambulating in the hall with PT and a front wheel walker, tol well.

## 2012-03-15 ENCOUNTER — Inpatient Hospital Stay (HOSPITAL_COMMUNITY): Payer: Medicare Other

## 2012-03-15 DIAGNOSIS — E119 Type 2 diabetes mellitus without complications: Secondary | ICD-10-CM | POA: Diagnosis not present

## 2012-03-15 DIAGNOSIS — N179 Acute kidney failure, unspecified: Secondary | ICD-10-CM | POA: Diagnosis not present

## 2012-03-15 DIAGNOSIS — I059 Rheumatic mitral valve disease, unspecified: Secondary | ICD-10-CM | POA: Diagnosis not present

## 2012-03-15 DIAGNOSIS — N12 Tubulo-interstitial nephritis, not specified as acute or chronic: Secondary | ICD-10-CM | POA: Diagnosis not present

## 2012-03-15 DIAGNOSIS — R0609 Other forms of dyspnea: Secondary | ICD-10-CM | POA: Diagnosis not present

## 2012-03-15 DIAGNOSIS — J441 Chronic obstructive pulmonary disease with (acute) exacerbation: Secondary | ICD-10-CM | POA: Diagnosis not present

## 2012-03-15 DIAGNOSIS — R0989 Other specified symptoms and signs involving the circulatory and respiratory systems: Secondary | ICD-10-CM

## 2012-03-15 DIAGNOSIS — J45909 Unspecified asthma, uncomplicated: Secondary | ICD-10-CM | POA: Diagnosis not present

## 2012-03-15 DIAGNOSIS — I5031 Acute diastolic (congestive) heart failure: Secondary | ICD-10-CM | POA: Diagnosis not present

## 2012-03-15 DIAGNOSIS — R0602 Shortness of breath: Secondary | ICD-10-CM | POA: Diagnosis not present

## 2012-03-15 DIAGNOSIS — N390 Urinary tract infection, site not specified: Secondary | ICD-10-CM | POA: Diagnosis not present

## 2012-03-15 DIAGNOSIS — R1310 Dysphagia, unspecified: Secondary | ICD-10-CM | POA: Diagnosis not present

## 2012-03-15 LAB — URINE CULTURE: Colony Count: >=100000

## 2012-03-15 LAB — GLUCOSE, CAPILLARY
Glucose-Capillary: 97 mg/dL (ref 70–99)
Glucose-Capillary: 89 mg/dL (ref 70–99)
Glucose-Capillary: 118 mg/dL — ABNORMAL HIGH (ref 70–99)
Glucose-Capillary: 226 mg/dL — ABNORMAL HIGH (ref 70–99)

## 2012-03-15 LAB — BASIC METABOLIC PANEL
BUN: 30 mg/dL — ABNORMAL HIGH (ref 6–23)
CO2: 24 mEq/L (ref 19–32)
Chloride: 105 mEq/L (ref 96–112)
GFR calc non Af Amer: 43 mL/min — ABNORMAL LOW (ref >90)
Glucose, Bld: 106 mg/dL — ABNORMAL HIGH (ref 70–99)
Potassium: 4.2 mEq/L (ref 3.5–5.1)
Sodium: 136 mEq/L (ref 135–145)

## 2012-03-15 MED ORDER — SIMETHICONE 80 MG PO CHEW
80.0000 mg | CHEWABLE_TABLET | Freq: Four times a day (QID) | ORAL | Status: DC | PRN
  Administered 2012-03-15 – 2012-03-16 (×2): 80 mg via ORAL
  Filled 2012-03-15 (×2): qty 1

## 2012-03-15 MED ORDER — LEVOFLOXACIN 500 MG PO TABS
500.0000 mg | ORAL_TABLET | ORAL | Status: DC
  Administered 2012-03-15 – 2012-03-17 (×3): 500 mg via ORAL
  Filled 2012-03-15 (×4): qty 1

## 2012-03-15 MED ORDER — BUDESONIDE 0.5 MG/2ML IN SUSP: 0.5000 mg | Freq: Two times a day (BID) | RESPIRATORY_TRACT | Status: DC

## 2012-03-15 MED ORDER — PANTOPRAZOLE SODIUM 40 MG PO TBEC
40.0000 mg | DELAYED_RELEASE_TABLET | Freq: Two times a day (BID) | ORAL | Status: DC
  Administered 2012-03-15 – 2012-03-17 (×4): 40 mg via ORAL
  Filled 2012-03-15 (×7): qty 1

## 2012-03-15 MED ORDER — ARFORMOTEROL TARTRATE 15 MCG/2ML IN NEBU
15.0000 ug | INHALATION_SOLUTION | Freq: Two times a day (BID) | RESPIRATORY_TRACT | Status: DC
  Administered 2012-03-15 – 2012-03-17 (×4): 15 ug via RESPIRATORY_TRACT
  Filled 2012-03-15 (×6): qty 2

## 2012-03-15 MED ORDER — ALPRAZOLAM 0.5 MG PO TABS
0.5000 mg | ORAL_TABLET | Freq: Every day | ORAL | Status: DC
  Administered 2012-03-15 – 2012-03-16 (×2): 0.5 mg via ORAL
  Filled 2012-03-15 (×2): qty 1

## 2012-03-15 NOTE — Progress Notes (Addendum)
Rehab Admissions Coordinator Note:  Patient was screened by Clois Dupes for appropriateness for an Inpatient Acute Rehab Consult. I was contacted by pt's son to assess for an admission to inpt rehab. Do not feel pt medically needs inpt rehab at this time.   At this time, we are recommending Skilled Nursing Facility. Son is in agreement with eventual ALF for his parents. I have recommended son speak with SW.  Clois Dupes 03/15/2012, 2:08 PM  I can be reached at [phone].

## 2012-03-15 NOTE — Progress Notes (Signed)
Pt c/o gas with freq burping, and ask for something for gas.  Dr Valentina Lucks on call, call placed to return call.

## 2012-03-15 NOTE — Evaluation (Signed)
Occupational Therapy Evaluation Patient Details Name: [name] [name] MRN: [id number] DOB: 1925-12-13 Today's Date: 03/15/2012 Time: 1031-1100    OT Assessment / Plan / Recommendation Clinical Impression  Pt presents to OT s/p admission to hospital with UTI and history of COPD. Pt will benefit from skilled OT to increase I with ADL activity and A pt in returning to [facility]     OT Assessment  Patient needs continued OT Services    Follow Up Recommendations  SNF;Home health OT;Supervision/Assistance - 24 hour;CIR    Barriers to Discharge Other (comment) (family plans to hire A)    Equipment Recommendations  None recommended by OT    Recommendations for Other Services Rehab consult (family request)  Frequency  Min 3X/week    Precautions / Restrictions Precautions Precautions: Fall Precaution Comments: monitor O2 Restrictions Weight Bearing Restrictions: No       ADL  Grooming: Simulated;Set up Where Assessed - Grooming: Supported sitting Upper Body Bathing: Simulated;Minimal assistance Where Assessed - Upper Body Bathing: Unsupported sitting Lower Body Bathing: Simulated;Moderate assistance Where Assessed - Lower Body Bathing: Supported sit to stand Upper Body Dressing: Simulated;Minimal assistance Where Assessed - Upper Body Dressing: Unsupported sitting Lower Body Dressing: Simulated;Moderate assistance Where Assessed - Lower Body Dressing: Supported sit to stand Toilet Transfer: Minimal assistance;Simulated Statistician Method: Sit to Barista: Comfort height toilet Toileting - Clothing Manipulation and Hygiene: Simulated;Minimal assistance Where Assessed - Toileting Clothing Manipulation and Hygiene: Standing Transfers/Ambulation Related to ADLs: Fatigue is most limiting factor for pt.  Pt will benefit from overall activity to increase endurance, as well as energy conservation    OT Diagnosis: Generalized weakness  OT Problem List: Decreased  strength;Cardiopulmonary status limiting activity;Decreased safety awareness   OT Goals Acute Rehab OT Goals OT Goal Formulation: With patient Time For Goal Achievement: 03/29/12 Potential to Achieve Goals: Good ADL Goals Pt Will Perform Grooming: with modified independence;Standing at sink ADL Goal: Grooming - Progress: Goal set today Pt Will Perform Upper Body Dressing: with set-up;Sit to stand from chair ADL Goal: Upper Body Dressing - Progress: Goal set today Pt Will Perform Lower Body Dressing: with set-up;Sit to stand from chair ADL Goal: Lower Body Dressing - Progress: Goal set today Pt Will Transfer to Toilet: with modified independence;Comfort height toilet ADL Goal: Toilet Transfer - Progress: Goal set today Pt Will Perform Toileting - Clothing Manipulation: with modified independence;Standing ADL Goal: Toileting - Clothing Manipulation - Progress: Goal set today  Visit Information  Last OT Received On: 03/15/12    Subjective Data  Subjective: I dont want to go to a skilled nursing place Patient Stated Goal: home with assist   Prior Functioning     Home Living Lives With: Spouse Available Help at Discharge: Family;Available 24 hours/day;Available PRN/intermittently Type of Home: House Home Access: Level entry Entrance Stairs-Number of Steps: from garage on bottom floor; 4-5 from front entrance they don't use Home Layout: Two level Alternate Level Stairs-Number of Steps: flight from bottom level entry or 5 from front entry Alternate Level Stairs-Rails: Right Bathroom Shower/Tub: Walk-in shower ("sit-in shower") Home Adaptive Equipment: Walker - rolling Prior Function Able to Take Stairs?: Yes Communication Communication: No difficulties         Vision/Perception Vision - History Patient Visual Report: No change from baseline   Cognition  Cognition Overall Cognitive Status: Appears within functional limits for tasks  assessed/performed Arousal/Alertness: Awake/alert Orientation Level: Appears intact for tasks assessed Behavior During Session: [facility] for tasks performed    Extremity/Trunk Assessment  Right Upper Extremity Assessment RUE ROM/Strength/Tone: [facility] for tasks assessed Left Upper Extremity Assessment LUE ROM/Strength/Tone: WFL for tasks assessed     Mobility Bed Mobility Bed Mobility: Supine to Sit Supine to Sit: 4: Min assist Transfers Transfers: Sit to Stand;Stand to Sit Sit to Stand: 4: Min guard;With upper extremity assist;From bed Stand to Sit: 4: Min guard;Without upper extremity assist;To chair/3-in-1           End of Session OT - End of Session Activity Tolerance: Patient limited by fatigue Patient left: in chair;with call bell/phone within reach  GO     [facility], [place] Kung 03/15/2012, 11:54 AM

## 2012-03-15 NOTE — Consult Note (Signed)
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 Noct asthma so pccm consulted 03/15/12  SIGNIFICANT EVENTS / STUDIES:  Echo 2/17 >>>     HISTORY OF PRESENT ILLNESS:    10/01/11 cc indolent onset slowly progressive doe x one year x 50 ft consistently so uses [facility] parking and rides cart at [facility]. Assoc with hoarseness and variable sense of wheezing but No unusual cough, assoc cp, purulent sputum or sinus/hb symptoms on present rx. No better with neb or spiriva, if anything  breathing worse p rx spiriva and also having urinary hesitance. Limited by knees about same time as breath, confirmed during ov s desat.  rec  Labs c/w mild cri, anemia, nl tsh and bnp  GERD diet /rx symbicort > some better 12/23/11  Since admit 2/14 having paroxyms of sob at hs ? Better p neb s cp or cough. No obvious daytime variabilty or assoc purulent sputum or cp or chest tightness, subjective wheeze overt sinus or hb symptoms. No unusual exp hx    ROS  The following are not active complaints unless bolded sore throat, dysphagia, dental problems, itching, sneezing,  nasal congestion or excess/ purulent secretions, ear ache,   fever, chills, sweats, unintended wt loss, pleuritic or exertional cp, hemoptysis,  orthopnea pnd or leg swelling, presyncope, palpitations, heartburn, abdominal pain, anorexia, nausea, vomiting, diarrhea  or change in bowel or urinary habits, change in stools or urine, dysuria,hematuria,  rash, arthralgias, visual complaints, headache, numbness weakness or  ataxia or problems with walking or coordination,  change in mood/affect or memory.      PAST MEDICAL HISTORY :  Past Medical History  Diagnosis Date  . CAD (coronary artery disease)   . Ischemic cardiomyopathy   . CHF (congestive heart failure)   . Dyslipidemia   . Diverticulosis of colon (without mention of hemorrhage)   . Diabetes mellitus   . Anemia, unspecified   . Unspecified disorder resulting from impaired renal function   . Chronic airway obstruction, not elsewhere classified     on o2 at night  . Prostatic hypertrophy     hx of  . Retention of urine, unspecified   . ICD (implantable cardiac defibrillator) in place     st jude  . Emphysema   . Hemorrhoids   . MI, old 2010 or 2011  . Macular degeneration   . Cataracts, bilateral    Past Surgical History  Procedure Laterality Date  . Pci      4/11  . Cardiac defibrillator placement  2011    [facility]e  . Pacemaker insertion  2011  . Tonsillectomy  79 yo  . Cataract extraction     Prior to Admission medications   Medication Sig Start Date End Date Taking? Authorizing Provider  ALPRAZolam (XANAX) 0.25 MG tablet Take 0.25 mg by mouth at bedtime as needed.    Yes Historical Provider, MD  aspirin EC 81 MG tablet Take 81 mg by mouth daily.   Yes Historical Provider, MD  Azelastine-Fluticasone 137-50  MCG/ACT SUSP Place 2 sprays into the nose daily. 02/05/12  Yes Nyoka Cowden, MD  budesonide-formoterol [facility]) 160-4.5 MCG/ACT inhaler Take 2 puffs first thing in am and then another 2 puffs about 12 hours later. 02/05/12  Yes Nyoka Cowden, MD  clopidogrel (PLAVIX) 75 MG tablet Take 1 tablet (75 mg total) by mouth daily. 08/15/11  Yes Wendall Stade, MD  Cyanocobalamin (VITAMIN B 12 PO) Inject as directed.   Yes Historical Provider, MD  losartan (COZAAR) 25 MG tablet Take 1 tablet (25 mg total) by mouth daily. 01/20/12  Yes Wendall Stade, MD  metoprolol tartrate (LOPRESSOR) 25 MG tablet 1/2 po bid   Yes Historical Provider,  MD  nitroGLYCERIN (NITROSTAT) 0.4 MG SL tablet Place 1 tablet (0.4 mg total) under the tongue every 5 (five) minutes as needed for chest pain. 09/15/11 09/14/12 Yes Wendall Stade, MD  omeprazole (PRILOSEC) 20 MG capsule Take 20 mg by mouth daily.   Yes Historical Provider, MD  rosuvastatin (CRESTOR) 20 MG tablet Take 5 mg by mouth daily. 08/15/11  Yes Wendall Stade, MD   No Known Allergies  FAMILY HISTORY:  Family History  Problem Relation Age of Onset  . Lung cancer Father     smoker  . Coronary artery disease Mother   . Stroke Mother   . Breast cancer Sister   . Atrial fibrillation Daughter   . Congestive Heart Failure Mother    SOCIAL HISTORY:  reports that he quit smoking about 2 years ago. His smoking use included Cigarettes. He smoked 0.00 packs per day. He has never used smokeless tobacco. He reports that  drinks alcohol. He reports that he does not use illicit drugs.     SUBJECTIVE/overnight:  Did better w/in past 24 h ? why  VITAL SIGNS: Temp:  [97.9 F (36.6 C)-99.3 F (37.4 C)] 97.9 F (36.6 C) (02/17 0640) Pulse Rate:  [63-80] 63 (02/17 0640) Resp:  [18-20] 18 (02/17 0640) BP: (122-123)/(56-67) 123/67 mmHg (02/17 0640) SpO2:  [95 %-97 %] 95 % (02/17 1255) Weight:  [176 lb 12.9 oz (80.2 kg)-177 lb 4 oz (80.4 kg)] 177 lb 4 oz (80.4 kg) (02/17 0640) 02 Rx =  2lpm NP  INTAKE / OUTPUT: Intake/Output     02/16 0701 - 02/17 0700 02/17 0701 - 02/18 0700   P.O. 954    I.V. (mL/kg) 1202.2 (15)    IV Piggyback 100    Total Intake(mL/kg) 2256.2 (28.1)    Urine (mL/kg/hr) 1400 (0.7)    Total Output 1400     Net +856.2          Stool Occurrence 1 x 1 x     PHYSICAL EXAMINATION: General:  dissheveled chronically ill wm nad lying flat HEENT mild turbinate edema.  Oropharynx no thrush or excess pnd or cobblestoning.  No JVD or cervical adenopathy. Mild accessory muscle hypertrophy. Trachea midline, nl thryroid. Chest was hyperinflated by percussion with diminished  breath sounds and moderate increased exp time without wheeze. Hoover sign positive at mid inspiration. Regular rate and rhythm without murmur gallop or rub or increase P2 or edema.  Abd: no hsm, nl excursion. Ext warm without cyanosis or clubbing.    LABS:  Recent Labs Lab 03/12/12 1530 03/12/12 1950 03/13/12 0535 03/14/12 0532 03/15/12 0511  HGB 9.3* 9.0* 8.0*  --   --   WBC 9.8 9.2 7.0  --   --   PLT 82* 85* 60*  --   --  NA 133*  --  137 136 136  K 4.2  --  3.8 4.5 4.2  CL 95*  --  103 103 105  CO2 25  --  23 23 24   GLUCOSE 98  --  108* 122* 106*  BUN 54*  --  46* 37* 30*  CREATININE 2.22* 2.12* 1.91* 1.66* 1.43*  CALCIUM 8.3*  --  7.6* 7.7* 8.0*    Recent Labs Lab 03/14/12 1129 03/14/12 1656 03/14/12 2139 03/15/12 0818 03/15/12 1146  GLUCAP 122* 122* 89 97 89    CXR 2/17 Lower lung volumes and mildly rotated to the right. No acute  cardiopulmonary abnormality.   ASSESSMENT / PLAN:  PULMONARY A: COPD/AB     - PFT's 04/16/11 FEV1  1.57 (60%) ratio 71 and 12% better p B2, dlco 41 corrects to 58    - 10/01/2011   Walked RA x one lap @ 185 stopped due to  Knees hurt, no sob or desat    - HFA 50% p coaching 12/23/2011   DDX of  difficult airways managment all start with A and  include Adherence, Ace Inhibitors, Acid Reflux, Active Sinus Disease, Alpha 1 Antitripsin deficiency, Anxiety masquerading as Airways dz,  ABPA,  Asthma/allergy(esp in young), Aspiration (esp in elderly), Adverse effects of DPI,  Active smokers, plus two Bs  = Bronchiectasis and Beta blocker use..and one C= CHF   Rec:   ? Asthma >Change to budesonide/brovana for now since so much difficulty maintaining mdi technique ? Acid reflux > max rx ? CHF > w/u in progress  Avoid LAMA here due to bladder dysfuntion and absence of a classic copd pattern pft  CARDIOVASCULAR - Echo pending this admit R/o cardiac asthma       discussed with son at bedside: As I explained to this patient in detail:   although there may be  copd present, it does not appear to be limiting activity tolerance any more than a set of worn tires limits someone from driving a car  around a parking lot.  A new set of Michelins might look good but would have no perceived impact on the performance of the car and would not be worth the cost.  That is to say:   this pt is so sedentary I don't recommend aggressive pulmonary rx at this point unless limiting symptoms arise or acute exacerbations become as issue, neither of which are the case now.    The noct events may be asthma but not clear > rx bud/brovana and rx gerd should address this if not I'd be happy to see again as outpt or inpt  Sandrea Hughs, MD Pulmonary and Critical Care Medicine [facility] Cell [phone] After 5:30 PM or weekends, call [phone]

## 2012-03-15 NOTE — Progress Notes (Signed)
Return call from Dr Valentina Lucks, new orders noted.

## 2012-03-15 NOTE — Progress Notes (Signed)
Echocardiogram 2D Echocardiogram has been performed.  [name] [name] 03/15/2012, 11:10 AM

## 2012-03-15 NOTE — Progress Notes (Addendum)
Physical Therapy Treatment Patient Details Name: [name] [name] MRN: [id number] DOB: 13-Feb-1925 Today's Date: 03/15/2012 Time: 0865-7846 PT Time Calculation (min): 23 min  PT Assessment / Plan / Recommendation Comments on Treatment Session  Progressing slowly. Continues to fatigue fairly easily. Recommend SNF for rehab. If pt discharges home, will require 24 hour supervision/assist.     Follow Up Recommendations  SNF;Supervision/Assistance - 24 hour;Home health PT     Does the patient have the potential to tolerate intense rehabilitation     Barriers to Discharge        Equipment Recommendations  None recommended by PT    Recommendations for Other Services    Frequency Min 3X/week   Plan Discharge plan needs to be updated    Precautions / Restrictions Precautions Precautions: Fall Restrictions Weight Bearing Restrictions: No   Pertinent Vitals/Pain No c/o pain. O2 sats 87% on RA at rest prior to ambulation. 90-91% on 2L O2 during ambulation    Mobility  Bed Mobility Bed Mobility: Supine to Sit;Sit to Supine Supine to Sit: 4: Min assist Sit to Supine: 4: Min guard Details for Bed Mobility Assistance: Assist for trunk to upright.  Transfers Transfers: Sit to Stand;Stand to Sit Sit to Stand: 4: Min guard;From bed;From elevated surface Stand to Sit: 4: Min guard;To bed;To elevated surface Details for Transfer Assistance: VCS safety, technique, hand placement. Pt tends to want to pull up on RW Ambulation/Gait Ambulation/Gait Assistance: 4: Min assist Ambulation Distance (Feet): 200 Feet Assistive device: Rolling walker Ambulation/Gait Assistance Details: Dyspnea 3/4 with ambulation on 2 L O2. Sats 90-91%. Assist to steady intermittently.  Gait Pattern: Trunk flexed;Decreased stride length;Step-through pattern    Exercises     PT Diagnosis:    PT Problem List:   PT Treatment Interventions:     PT Goals Acute Rehab PT Goals Pt will go Supine/Side to Sit: with  supervision PT Goal: Supine/Side to Sit - Progress: Progressing toward goal Pt will go Sit to Supine/Side: with supervision PT Goal: Sit to Supine/Side - Progress: Progressing toward goal Pt will go Sit to Stand: with supervision PT Goal: Sit to Stand - Progress: Progressing toward goal Pt will Ambulate: >150 feet;with supervision;with least restrictive assistive device PT Goal: Ambulate - Progress: Progressing toward goal  Visit Information  Last PT Received On: 03/15/12 Assistance Needed: +1    Subjective Data      Cognition       Balance     End of Session PT - End of Session Equipment Utilized During Treatment: Gait belt;Oxygen Activity Tolerance: Patient limited by fatigue Patient left: in bed;with call bell/phone within reach   GP     Rebeca Alert [facility] 03/15/2012, 4:17 PM [phone]

## 2012-03-15 NOTE — Progress Notes (Signed)
Subjective: Episode of SOB. Panic symptoms at night Severe COPD Probable Cor pul/ right heart strain Still weak   Objective: Vital signs in last 24 hours: Temp:  [97.9 F (36.6 C)-99.6 F (37.6 C)] 97.9 F (36.6 C) (02/17 0640) Pulse Rate:  [63-80] 63 (02/17 0640) Resp:  [18-20] 18 (02/17 0640) BP: (105-123)/(56-67) 123/67 mmHg (02/17 0640) SpO2:  [95 %-98 %] 96 % (02/17 0640) Weight:  [80.2 kg (176 lb 12.9 oz)-80.4 kg (177 lb 4 oz)] 80.4 kg (177 lb 4 oz) (02/17 0640) Weight change: -4.6 kg (-10 lb 2.3 oz) Last BM Date: 03/12/12  Intake/Output from previous day: 02/16 0701 - 02/17 0700 In: 2256.2 [P.O.:954; I.V.:1202.2; IV Piggyback:100] Out: 1400 [Urine:1400] Intake/Output this shift:    General appearance: alert Resp: rhonchi bilaterally and wheezes bilaterally Cardio: regular rate and rhythm Extremities: extremities normal, atraumatic, no cyanosis or edema  Lab Results:  Recent Labs  03/12/12 1950 03/13/12 0535  WBC 9.2 7.0  HGB 9.0* 8.0*  HCT 27.9* 24.9*  PLT 85* 60*   BMET  Recent Labs  03/14/12 0532 03/15/12 0511  NA 136 136  K 4.5 4.2  CL 103 105  CO2 23 24  GLUCOSE 122* 106*  BUN 37* 30*  CREATININE 1.66* 1.43*  CALCIUM 7.7* 8.0*    Studies/Results: No results found.  Medications: I have reviewed the patient's current medications.  Assessment/Plan: UTI/ pylonephritis: Cx negative- no fever Change po ABX levoquin SOB/ multiple factor- COPD/ ? CHF right heart-  Pulmonary- ? Low dose steroid helpful- neb Cardiology consult- echo and repeat CXR A/C renal- improving  anxeity- xanax at night MGUS- Anemia stable Weakness- PT/ SNF vs HHPT/ 24 hr care  LOS: 3 days   Avagail Whittlesey 03/15/2012, 7:42 AM

## 2012-03-15 NOTE — Progress Notes (Signed)
Clinical Social Work Department BRIEF PSYCHOSOCIAL ASSESSMENT 03/15/2012  Patient:  [name] [name],[name] [name]     Account Number:  [id number]     Admit date:  03/12/2012  Clinical Social Worker:  Hattie Perch  Date/Time:  03/15/2012 12:00 M  Referred by:  Physician  Date Referred:  03/15/2012 Referred for  SNF Placement   Other Referral:   Interview type:  Patient Other interview type:   son at bedside    PSYCHOSOCIAL DATA Living Status:  FAMILY Admitted from facility:   Level of care:   Primary support name:  [name] [name] Primary support relationship to patient:  CHILD, ADULT Degree of support available:   good    CURRENT CONCERNS Current Concerns  Post-Acute Placement   Other Concerns:    SOCIAL WORK ASSESSMENT / PLAN CSW met with patient and patient son at bedside. Patient is alert and oriented X3. Patient in need of snf placement. agreeable to same. requesting [facility].   Assessment/plan status:   Other assessment/ plan:   Information/referral to community resources:    PATIENT'S/FAMILY'S RESPONSE TO PLAN OF CARE: agreeable to [facility] for snf.

## 2012-03-16 ENCOUNTER — Inpatient Hospital Stay (HOSPITAL_COMMUNITY): Payer: Medicare Other

## 2012-03-16 DIAGNOSIS — E119 Type 2 diabetes mellitus without complications: Secondary | ICD-10-CM | POA: Diagnosis not present

## 2012-03-16 DIAGNOSIS — R143 Flatulence: Secondary | ICD-10-CM | POA: Diagnosis not present

## 2012-03-16 DIAGNOSIS — J45909 Unspecified asthma, uncomplicated: Secondary | ICD-10-CM | POA: Diagnosis not present

## 2012-03-16 DIAGNOSIS — R1310 Dysphagia, unspecified: Secondary | ICD-10-CM | POA: Diagnosis not present

## 2012-03-16 DIAGNOSIS — J441 Chronic obstructive pulmonary disease with (acute) exacerbation: Secondary | ICD-10-CM | POA: Diagnosis not present

## 2012-03-16 DIAGNOSIS — K219 Gastro-esophageal reflux disease without esophagitis: Secondary | ICD-10-CM | POA: Diagnosis not present

## 2012-03-16 DIAGNOSIS — N390 Urinary tract infection, site not specified: Secondary | ICD-10-CM | POA: Diagnosis not present

## 2012-03-16 DIAGNOSIS — K224 Dyskinesia of esophagus: Secondary | ICD-10-CM | POA: Diagnosis not present

## 2012-03-16 DIAGNOSIS — N179 Acute kidney failure, unspecified: Secondary | ICD-10-CM | POA: Diagnosis not present

## 2012-03-16 DIAGNOSIS — R0609 Other forms of dyspnea: Secondary | ICD-10-CM | POA: Diagnosis not present

## 2012-03-16 DIAGNOSIS — N12 Tubulo-interstitial nephritis, not specified as acute or chronic: Secondary | ICD-10-CM | POA: Diagnosis not present

## 2012-03-16 DIAGNOSIS — I5031 Acute diastolic (congestive) heart failure: Secondary | ICD-10-CM | POA: Diagnosis not present

## 2012-03-16 LAB — GLUCOSE, CAPILLARY

## 2012-03-16 LAB — BASIC METABOLIC PANEL
BUN: 26 mg/dL — ABNORMAL HIGH (ref 6–23)
GFR calc Af Amer: 56 mL/min — ABNORMAL LOW (ref >90)
GFR calc non Af Amer: 49 mL/min — ABNORMAL LOW (ref >90)
Potassium: 4.4 mEq/L (ref 3.5–5.1)

## 2012-03-16 MED ORDER — DSS 100 MG PO CAPS: 100.0000 mg | ORAL_CAPSULE | Freq: Two times a day (BID) | ORAL | Status: DC

## 2012-03-16 MED ORDER — ARFORMOTEROL TARTRATE 15 MCG/2ML IN NEBU: 15.0000 ug | INHALATION_SOLUTION | Freq: Two times a day (BID) | RESPIRATORY_TRACT | Status: DC

## 2012-03-16 MED ORDER — BUDESONIDE 0.5 MG/2ML IN SUSP: 0.5000 mg | Freq: Two times a day (BID) | RESPIRATORY_TRACT | Status: DC

## 2012-03-16 MED ORDER — ALPRAZOLAM 0.25 MG PO TABS: 0.2500 mg | ORAL_TABLET | Freq: Four times a day (QID) | ORAL | Status: DC | PRN

## 2012-03-16 MED ORDER — PANTOPRAZOLE SODIUM 40 MG PO TBEC: 40.0000 mg | DELAYED_RELEASE_TABLET | Freq: Two times a day (BID) | ORAL | Status: DC

## 2012-03-16 MED ORDER — FUROSEMIDE 20 MG PO TABS: 20.0000 mg | ORAL_TABLET | Freq: Every day | ORAL | Status: DC

## 2012-03-16 MED ORDER — NON FORMULARY: 4.0000 mg | Freq: Four times a day (QID) | Status: DC

## 2012-03-16 MED ORDER — GUAIFENESIN ER 600 MG PO TB12: 1200.0000 mg | ORAL_TABLET | Freq: Two times a day (BID) | ORAL | Status: DC

## 2012-03-16 MED ORDER — ALBUTEROL SULFATE (5 MG/ML) 0.5% IN NEBU: 2.5000 mg | INHALATION_SOLUTION | RESPIRATORY_TRACT | Status: DC | PRN

## 2012-03-16 MED ORDER — FUROSEMIDE 20 MG PO TABS
20.0000 mg | ORAL_TABLET | Freq: Every day | ORAL | Status: DC
  Administered 2012-03-16 – 2012-03-17 (×2): 20 mg via ORAL
  Filled 2012-03-16 (×2): qty 1

## 2012-03-16 MED ORDER — GUAIFENESIN ER 600 MG PO TB12
600.0000 mg | ORAL_TABLET | Freq: Two times a day (BID) | ORAL | Status: DC
  Administered 2012-03-16 – 2012-03-17 (×3): 600 mg via ORAL
  Filled 2012-03-16 (×4): qty 1

## 2012-03-16 MED ORDER — POLYETHYLENE GLYCOL 3350 17 G PO PACK: 17.0000 g | PACK | Freq: Every day | ORAL | Status: DC | PRN

## 2012-03-16 MED ORDER — CHLORPHENIRAMINE MALEATE 4 MG PO TABS
4.0000 mg | ORAL_TABLET | ORAL | Status: DC
  Filled 2012-03-16 (×5): qty 1

## 2012-03-16 MED ORDER — LEVOFLOXACIN 500 MG PO TABS: 500.0000 mg | ORAL_TABLET | ORAL | Status: DC

## 2012-03-16 MED ORDER — ALPRAZOLAM 0.5 MG PO TABS: 0.5000 mg | ORAL_TABLET | Freq: Every day | ORAL | Status: DC

## 2012-03-16 MED ORDER — DOCUSATE SODIUM 100 MG PO CAPS
100.0000 mg | ORAL_CAPSULE | Freq: Two times a day (BID) | ORAL | Status: DC
  Administered 2012-03-16 – 2012-03-17 (×2): 100 mg via ORAL
  Filled 2012-03-16 (×3): qty 1

## 2012-03-16 MED ORDER — GUAIFENESIN ER 600 MG PO TB12: 600.0000 mg | ORAL_TABLET | Freq: Two times a day (BID) | ORAL | Status: DC

## 2012-03-16 NOTE — Progress Notes (Signed)
Subjective: Some better last night with breathing, xanax helps Pul consult noted Still weak/ coughing spell in night with some panic  Objective: Vital signs in last 24 hours: Temp:  [98.2 F (36.8 C)-99.2 F (37.3 C)] 98.4 F (36.9 C) (02/18 0535) Pulse Rate:  [69-86] 70 (02/18 0535) Resp:  [16-32] 32 (02/18 0626) BP: (110-137)/(57-79) 127/79 mmHg (02/18 0535) SpO2:  [95 %-98 %] 98 % (02/18 0535) Weight:  [83.235 kg (183 lb 8 oz)] 83.235 kg (183 lb 8 oz) (02/18 0535) Weight change: 3.035 kg (6 lb 11.1 oz) Last BM Date: 03/12/12  Intake/Output from previous day: 02/17 0701 - 02/18 0700 In: -  Out: 950 [Urine:950] Intake/Output this shift:    General appearance: alert Resp: rhonchi bilaterally and wheezes bilaterally Cardio: regular rate and rhythm Extremities: extremities normal, atraumatic, no cyanosis or edema  Lab Results: No results found for this basename: WBC, HGB, HCT, PLT,  in the last 72 hours BMET  Recent Labs  03/15/12 0511 03/16/12 0511  NA 136 141  K 4.2 4.4  CL 105 105  CO2 24 28  GLUCOSE 106* 93  BUN 30* 26*  CREATININE 1.43* 1.30  CALCIUM 8.0* 8.3*    Studies/Results: Dg Chest 1 View  03/15/2012  *RADIOLOGY REPORT*  Clinical Data: 79 year old male shortness of breath.  CHEST - 1 VIEW  Comparison: 03/12/2012 and earlier.  Findings: AP upright view 0834 hours.  The patient now rotated to the right.  Lower lung volumes.  No pneumothorax, pulmonary edema or pleural effusion.  Stable left chest cardiac pacemaker.  Stable cardiac size and mediastinal contours.  IMPRESSION: Lower lung volumes and mildly rotated to the right. No acute cardiopulmonary abnormality.   Original Report Authenticated By: Erskine Speed, M.D.     Medications: I have reviewed the patient's current medications.  Assessment/Plan: UTI/ pylonephritis: Cx negative- no fever   po ABX levoquin for 10 days SOB/ multiple factor- COPD/ ? CHF right heart- Echo- ok with grade 1 Diastolic  dysfunction Pulmonary- nebs with brovana and pulmocort/ alb neb and o2- add mucinex-cxr ok Cardiology consult- ? Component of right heart failure   A/C renal-resolved cr at baseline 1.3- off fluids anxeity- xanax at night helpful MGUS- Anemia stable  Weakness- PT/ SNF- agreeable - discussed with family and pt. [facility] either today or tomorrow Bladder obstruction with intermittent cath at home- will keep foley  for some time till stronger    LOS: 4 days   [name] [name] 03/16/2012, 8:28 AM

## 2012-03-16 NOTE — Progress Notes (Signed)
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 Noct asthma so pccm consulted 03/15/12  SIGNIFICANT EVENTS / STUDIES:  Echo 2/17 > Grade I diastolic dysfunction  SUBJECTIVE/overnight:  Did better w/in past 24 h ? Why/ some abd bloating x 24 h  VITAL SIGNS: Temp:  [98.2 F (36.8 C)-99.2 F (37.3 C)] 98.4 F (36.9 C) (02/18 0535) Pulse Rate:  [69-86] 70 (02/18 0535) Resp:  [16-32] 32 (02/18 0626) BP: (110-137)/(57-79) 127/79 mmHg (02/18 0535) SpO2:  [95 %-98 %] 98 % (02/18 0906) Weight:  [83.235 kg (183 lb 8 oz)] 83.235 kg (183 lb 8 oz) (02/18 0535) 02 Rx =  2lpm NP  INTAKE / OUTPUT: Intake/Output     02/17 0701 - 02/18 0700 02/18 0701 - 02/19 0700   P.O.  240   I.V. (mL/kg)     IV Piggyback     Total Intake(mL/kg)  240 (2.9)   Urine (mL/kg/hr) 950 (0.5)    Total Output 950     Net -950 +240        Stool Occurrence 1 x      PHYSICAL EXAMINATION: General:  dissheveled chronically ill wm nad lying flat HEENT mild turbinate edema.  Oropharynx no thrush or excess pnd or cobblestoning.  No JVD or cervical adenopathy. Mild accessory muscle hypertrophy. Trachea midline, nl thryroid. Marked upper airway wheeze. Chest was hyperinflated by percussion with diminished breath sounds and moderate increased exp time without wheeze. Hoover sign positive at mid inspiration. Regular rate and rhythm without murmur gallop or rub or increase P2 or edema.  Abd: no hsm, nl excursion. Ext warm without cyanosis or clubbing.    LABS:  Recent Labs Lab  03/12/12 1530 03/12/12 1950 03/13/12 0535 03/14/12 0532 03/15/12 0511 03/16/12 0511  HGB 9.3* 9.0* 8.0*  --   --   --   WBC 9.8 9.2 7.0  --   --   --   PLT 82* 85* 60*  --   --   --   NA 133*  --  137 136 136 141  K 4.2  --  3.8 4.5 4.2 4.4  CL 95*  --  103 103 105 105  CO2 25  --  23 23 24 28   GLUCOSE 98  --  108* 122* 106* 93  BUN 54*  --  46* 37* 30* 26*  CREATININE 2.22* 2.12* 1.91* 1.66* 1.43* 1.30  CALCIUM 8.3*  --  7.6* 7.7* 8.0* 8.3*    Recent Labs Lab 03/15/12 0818 03/15/12 1146 03/15/12 1721 03/15/12 2214 03/16/12 0725  GLUCAP 97 89 118* 226* 97    CXR 2/17 Lower lung volumes and mildly rotated to the right. No acute  cardiopulmonary abnormality.   ASSESSMENT / PLAN:  A: COPD/AB     - PFT's 04/16/11 FEV1  1.57 (60%) ratio 71 and 12% better p B2, dlco 41 corrects to 58    - 10/01/2011   Walked RA x one lap @ 185 stopped due to  Knees hurt, no sob or  desat    - HFA 50% p coaching 12/23/2011  Think that this is more upper airway than bronchospasm. Had the opportunity to visit with him while he was eating. His voice quality is garbled and seems to pool oral food boluses. This raises suspicion that reflux and possibly aspiration may be contributing factors to his dyspnea. In light of the fact that he has sudden nocturnal episodes of "wheezing" think reflux and aspiration warrant further evaluation.   Rec:   -cont budesonide/brovana for now since so much difficulty maintaining mdi technique -maximize reflux rx Avoid LAMA here due to bladder dysfuntion and absence of a classic copd pattern pft Maximize nasal regimen -slp eval -esophagram  CARDIOVASCULAR - No evidence sign cardiac asthma   Sandrea Hughs, MD Pulmonary and Critical Care Medicine [facility] Cell [phone] After 5:30 PM or weekends, call [phone]

## 2012-03-16 NOTE — Progress Notes (Signed)
Pt c/o bloating in abd.  Distended at present, and abd was soft this am.  Dr Donette Larry called and informed new orders noted.

## 2012-03-16 NOTE — Progress Notes (Signed)
telephone consultation with cardiology: Dr Estrella Myrtle;  Because of his underlying history of CHF 2-D echo showed grade 1 diastolic dysfunction with underlying severe COPD BNP mildly elevated at 1511; start on Lasix 20 mg daily Followup cardiology outpatient Recheck BMP in 1 week. - Pulmonary ordered esophagogram for GERD evaluation- currently on PPI , b.i.d.. [name]

## 2012-03-16 NOTE — Clinical Documentation Improvement (Signed)
CHF DOCUMENTATION CLARIFICATION QUERY  THIS DOCUMENT IS NOT A PERMANENT PART OF THE MEDICAL RECORD  TO RESPOND TO THE THIS QUERY, FOLLOW THE INSTRUCTIONS BELOW:  1. If needed, update documentation for the patient's encounter via the notes activity.  2. Access this query again and click edit on the In [employer].  3. After updating, or not, click F2 to complete all highlighted (required) fields concerning your review. Select "additional documentation in the medical record" OR "no additional documentation provided".  4. Click Sign note button.  5. The deficiency will fall out of your In Basket *Please let us know if you are not able to complete this workflow by phone or e-mail (listed below).  Please update your documentation within the medical record to reflect your response to this query.                                                                                    03/16/12  Dear Dr. Donette Larry, / Associates,  In a better effort to capture your patient's severity of illness, reflect appropriate length of stay and utilization of resources, a review of the patient medical record has revealed the following indicators the diagnosis of Heart Failure.    Based on your clinical judgment, please clarify and document in a progress note and/or discharge summary the clinical condition associated with the following supporting information:  In responding to this query please exercise your independent judgment.  The fact that a query is asked, does not imply that any particular answer is desired or expected. Pt. past medical history indicates CHF.  .  If possible, please specify type and  acuity of CHF in your progress note. Thank you  Possible Clinical Conditions? . Chronic Systolic Congestive Heart Failure . Chronic Diastolic Congestive Heart Failure . Chronic Systolic  & Diastolic Congestive Heart Failure  . Acute Systolic Congestive Heart Failure . Acute  Diastolic Congestive Heart  Failure . Acute  Systolic  & Diastolic Congestive Heart Failure . Acute on Chronic Systolic Congestive Heart Failure . Acute  on Chronic Diastolic Congestive Heart Failure . Acute  on Chronic Systolic  & Diastolic Congestive Heart Failure . Other Condition________________________________________ Cannot Clinically Determine  Supporting Information: Risk Factors: History of CAD, ICM with EF 30% to 35%, HTN, HLD, T2DM, COPD on 2L home O2, anemia  Signs & Symptoms :  per H&P > "last few days he is SOB walking around his house, using baseline 2L home oxygen, "  per MD progress notes:   " SOB/ multiple factor- COPD/ ? CHF right heart- Echo- ok with grade 1 Diastolic dysfunction" per Cardiology consult: "Cardiology consult- ? Component of right heart failure "    Echo results:   03/15/12  Left ventricle: LVEF is approximately 55 to 60% with   apical hypokinesis The cavity size was normal. Wall   thickness was normal. Doppler parameters are consistent   with abnormal left ventricular relaxation (grade 1   diastolic dysfunction). - Aortic valve: Trivial regurgitation. - Mitral valve: Calcified annulus. Moderately thickened   leaflets . Mild regurgitation EKG: 03/10/12   Radiology: 03/12/12   Treatment: O2 @ 2L /min sats 95-98% per flowsheets  Meds: Proventil,  Pulmicort,    Reviewed: additional documentation in the medical record  On 03/17/12 Dr Donette Larry   (GT)  Thank You,  Andy Gauss  Clinical Documentation Specialist:  Pager [phone] # [phone]  Health Information Management [facility]

## 2012-03-16 NOTE — Progress Notes (Signed)
Physical Therapy Treatment Patient Details Name: [name] [name] MRN: [id number] DOB: 1925-06-10 Today's Date: 03/16/2012 Time: 7829-5621 PT Time Calculation (min): 22 min  PT Assessment / Plan / Recommendation Comments on Treatment Session  Progressing well, pt will benefit from SNF. per daughter, plans for tomorrow.continue PT.    Follow Up Recommendations  SNF     Does the patient have the potential to tolerate intense rehabilitation     Barriers to Discharge        Equipment Recommendations  None recommended by PT    Recommendations for Other Services    Frequency Min 3X/week   Plan Discharge plan needs to be updated;Frequency remains appropriate    Precautions / Restrictions Precautions Precautions: Fall Precaution Comments: monitor O2   Pertinent Vitals/Pain sats 90 % on RA([place] on forehead) after ambulation 92% 2 l. HR 67 DYSPNEA 3/4, ENCOURAGED PURSED LIP BREATHS.   Mobility  Bed Mobility Supine to Sit: 4: Min guard Details for Bed Mobility Assistance: pt did not want assistance Transfers Sit to Stand: 4: Min guard;From bed;From elevated surface Stand to Sit: 4: Min guard;To elevated surface;To chair/3-in-1;With upper extremity assist Details for Transfer Assistance: VCS safety, technique, hand placement. Pt tends to want to pull up on RW Ambulation/Gait Ambulation/Gait Assistance: 4: Min guard Ambulation Distance (Feet): 200 Feet Assistive device: Rolling walker Ambulation/Gait Assistance Details: pt. would not allow PT to physically guard during gait. Gait was steady, no balance loss. Gait Pattern: Trunk flexed;Decreased stride length;Step-through pattern Gait velocity: decreased    Exercises     PT Diagnosis:    PT Problem List:   PT Treatment Interventions:     PT Goals Acute Rehab PT Goals Pt will go Supine/Side to Sit: with supervision PT Goal: Supine/Side to Sit - Progress: Progressing toward goal Pt will go Sit to Stand: with supervision PT  Goal: Sit to Stand - Progress: Progressing toward goal Pt will Ambulate: >150 feet;with supervision;with least restrictive assistive device PT Goal: Ambulate - Progress: Progressing toward goal Pt will Go Up / Down Stairs: 3-5 stairs;with supervision;with least restrictive assistive device;with rail(s) PT Goal: Up/Down Stairs - Progress: Discontinued (comment)  Visit Information  Last PT Received On: 03/16/12 Assistance Needed: +1    Subjective Data  Subjective: Don't touch me, please.   Cognition  Cognition Overall Cognitive Status: Appears within functional limits for tasks assessed/performed Arousal/Alertness: Awake/alert Orientation Level: Appears intact for tasks assessed Behavior During Session: [facility] for tasks performed    Balance     End of Session PT - End of Session Equipment Utilized During Treatment: Oxygen Activity Tolerance: Patient limited by fatigue Patient left: in chair;with family/visitor present Nurse Communication: Mobility status   GP     Blanchard Kelch ElizabethPT. 03/16/2012, 3:08 PM [phone]

## 2012-03-17 DIAGNOSIS — N289 Disorder of kidney and ureter, unspecified: Secondary | ICD-10-CM | POA: Diagnosis not present

## 2012-03-17 DIAGNOSIS — E119 Type 2 diabetes mellitus without complications: Secondary | ICD-10-CM | POA: Diagnosis not present

## 2012-03-17 DIAGNOSIS — I509 Heart failure, unspecified: Secondary | ICD-10-CM | POA: Diagnosis not present

## 2012-03-17 DIAGNOSIS — N400 Enlarged prostate without lower urinary tract symptoms: Secondary | ICD-10-CM | POA: Diagnosis not present

## 2012-03-17 DIAGNOSIS — D649 Anemia, unspecified: Secondary | ICD-10-CM | POA: Diagnosis not present

## 2012-03-17 DIAGNOSIS — J449 Chronic obstructive pulmonary disease, unspecified: Secondary | ICD-10-CM | POA: Diagnosis not present

## 2012-03-17 DIAGNOSIS — R1310 Dysphagia, unspecified: Secondary | ICD-10-CM | POA: Diagnosis not present

## 2012-03-17 DIAGNOSIS — N390 Urinary tract infection, site not specified: Secondary | ICD-10-CM | POA: Diagnosis not present

## 2012-03-17 DIAGNOSIS — J4489 Other specified chronic obstructive pulmonary disease: Secondary | ICD-10-CM | POA: Diagnosis not present

## 2012-03-17 DIAGNOSIS — R5381 Other malaise: Secondary | ICD-10-CM | POA: Diagnosis not present

## 2012-03-17 DIAGNOSIS — N12 Tubulo-interstitial nephritis, not specified as acute or chronic: Secondary | ICD-10-CM | POA: Diagnosis not present

## 2012-03-17 DIAGNOSIS — Z9981 Dependence on supplemental oxygen: Secondary | ICD-10-CM | POA: Diagnosis not present

## 2012-03-17 DIAGNOSIS — J441 Chronic obstructive pulmonary disease with (acute) exacerbation: Secondary | ICD-10-CM | POA: Diagnosis not present

## 2012-03-17 DIAGNOSIS — Z5189 Encounter for other specified aftercare: Secondary | ICD-10-CM | POA: Diagnosis not present

## 2012-03-17 DIAGNOSIS — I5031 Acute diastolic (congestive) heart failure: Secondary | ICD-10-CM | POA: Diagnosis not present

## 2012-03-17 DIAGNOSIS — N179 Acute kidney failure, unspecified: Secondary | ICD-10-CM | POA: Diagnosis not present

## 2012-03-17 LAB — GLUCOSE, CAPILLARY: Glucose-Capillary: 144 mg/dL — ABNORMAL HIGH (ref 70–99)

## 2012-03-17 MED ORDER — ALBUTEROL SULFATE (5 MG/ML) 0.5% IN NEBU: 2.5000 mg | INHALATION_SOLUTION | Freq: Four times a day (QID) | RESPIRATORY_TRACT | Status: DC

## 2012-03-17 MED ORDER — ROSUVASTATIN CALCIUM 5 MG PO TABS: 5.0000 mg | ORAL_TABLET | Freq: Every day | ORAL | Status: DC

## 2012-03-17 NOTE — Plan of Care (Signed)
Problem: Phase III Progression Outcomes Goal: Foley discontinued Outcome: Not Met (add Reason) Physician specifically states to leave catheter in upon d/c to [facility] with Urology follow-up.

## 2012-03-17 NOTE — Discharge Summary (Signed)
Physician Discharge Summary  Patient ID: [name] [name] MRN: [id number] DOB/AGE: 1926-01-27 79 y.o.  Admit date: 03/12/2012 Discharge date: 03/17/2012  Admission Diagnoses:  Discharge Diagnoses:  Principal Problem: UTI/pyelonephritis History of chronic bladder obstruction with intermittent catheterization at home.   Acute on chronic renal insufficiency COPD exacerbation Diastolic CHF acute- 2-D echo showed EF of 55% with grade 1 diastolic dysfunction Esophageal dysmotility severe- with possible distal esophageal narrowing- esophagogram Constipation Acute on chronic kidney disease- baseline creatinine 1.3 Deconditioning weakness  Active Problems:   DIABETES MELLITUS, TYPE II   DYSLIPIDEMIA   Anemia of chronic disease   CAD   Asthma vs vcd   Chronic kidney disease, stage 3   URINARY RETENTION   BENIGN PROSTATIC HYPERTROPHY, HX OF   CHF (congestive heart failure)   MGUS (monoclonal gammopathy of unknown significance)   Thrombocytopenia   Beta thalassemia trait   UTI (lower urinary tract infection)   Gait instability   Fall at home   Discharged Condition: good  Hospital Course: 79 years old male with history of CAD, COPD, O2 dependent admitted with fever elevated white count;  acute chronic renal failure. Problem #1: UTI cultures were negative started on IV antibiotics white count came down to normal. Question of UTI /pyelonephritis,he was switched to Levaquin-finish a ten-day course. He remained afebrile. History of BPH with bladder obstruction and intermittent catheterization at home patient had a Foley catheter in place a cause of deconditioning it should be removed in one week also and go back to his in and out catheterization. COPD exacerbation chest x-ray did not pneumonia. Pulmonary was consulted patient was unable to do the MDIs patient was switched to nebulizer treatments; continue his nebulizer treatments as well as oxygen 2 L continuous; Mucinex was added CHF  diastolic heart failure acute after having IV fluids, BNP was mildly elevated. Cardiology consultation, started on low-dose Lasix 20 mg daily. Symptoms improved. 2-D echocardiogram showed EF of 55% with grade 1 diastolic dysfunction. Follow with cardiology outpatient Acute chronic renal failure: creatinine was 2.2 admission started on IV fluids with improvement in renal function baseline creatinine 1.3 at the time of discharge creatinine was 1.3. , Potassium 4.4. He will need another blood chemistries checked next week Hypertension: discontinued Cozaar because of low blood admission his blood pressure discharge was 140/70, if the blood pressure elevated consider restarting Cozaar 25 mg Patient was continued on his beta blockers Borderline diabetes diet control; continue monitoring blood sugars at least once a day MGUS/thrombocytopenia/chronic anemia hemoglobin was 8, baseline and 9. Platelet count 60,000, WBC count 7.0.-  followed by hematology outpatient Chronic bladder obstruction due to BPH Intermittent catheterization at home; Foley catheter was left in place because of deconditioning; we plan to remove Foley catheter in one week; and go back to intermittent catheterization by the patient. Deconditioning instability of gait: physical therapy recommended continue with physical therapy. Anxiety: started on Xanax at night with improvement in his breathing. Esophageal dysmotility: upper GI esophagogram obtained showed severe dysmotility with possible narrowing of the distal esophagus. Patient will need endoscopy outpatient once he is stronger he had no choking when he eats. Recurrence small bites and speech therapy. Continue PPI Constipation: KUB showed moderate constipation without any obstruction continue MiraLax, Colace. 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  Consults: cardiology and pulmonary/intensive care  Significant Diagnostic Studies: labs:  creatinine at discharge 1.3 potassium 4.4, bili BC 7.0 hemoglobin 8.0, platelets 60 K,BNP 1511. microbiology: blood culture: negative and urine  culture: negative and radiology: CXR: pulmonary edema and KUB: mild constipation  Treatments: IV hydration, antibiotics: Levaquin and ceftriaxone and respiratory therapy: O2 and albuterol/atropine nebulizer  Discharge Exam: Blood pressure 144/73, pulse 73, temperature 98.2 F (36.8 C), temperature source Oral, resp. rate 18, height 6' (1.829 m), weight 83.235 kg (183 lb 8 oz), SpO2 94.00%. General appearance: alert Resp: rhonchi bibasilar Cardio: regular rate and rhythm GI: soft, non-tender; bowel sounds normal; no masses,  no organomegaly  Disposition: skilled care facility for rehabilitation  Discharge Orders   Future Appointments Provider Department Dept Phone   04/09/2012 4:00 PM Hillis Range, MD [facility] Main Office [place]) [phone]   Future Orders Complete By Expires     Diet - low sodium heart healthy  As directed     Discharge instructions  As directed     Scheduling Instructions:      F/u appt with Dr Estrella Myrtle cardiology in 2-3 weeks    Comments:      BMET in 1 week Foley cather remove in 4-5 days as he get stronger- pt does in-out cath at home 2-3 time a day    Increase activity slowly  As directed         Medication List    STOP taking these medications       budesonide-formoterol 160-4.5 MCG/ACT inhaler  Commonly known as:  SYMBICORT     losartan 25 MG tablet  Commonly known as:  COZAAR     omeprazole 20 MG capsule  Commonly known as:  PRILOSEC  Replaced by:  pantoprazole 40 MG tablet      TAKE these medications       albuterol (5 MG/ML) 0.5% nebulizer solution  Commonly known as:  PROVENTIL  Take 0.5 mLs (2.5 mg total) by nebulization 4 (four) times daily. And q3 hrs prn for SOB     ALPRAZolam 0.25 MG tablet  Commonly known as:  XANAX  Take 1 tablet (0.25 mg total) by mouth every 6 (six) hours as  needed for sleep.     ALPRAZolam 0.5 MG tablet  Commonly known as:  XANAX  Take 1 tablet (0.5 mg total) by mouth at bedtime.     arformoterol 15 MCG/2ML Nebu  Commonly known as:  BROVANA  Take 2 mLs (15 mcg total) by nebulization 2 (two) times daily.     aspirin EC 81 MG tablet  Take 81 mg by mouth daily.     Azelastine-Fluticasone 137-50 MCG/ACT Susp  Place 2 sprays into the nose daily.     budesonide 0.5 MG/2ML nebulizer solution  Commonly known as:  PULMICORT  Take 2 mLs (0.5 mg total) by nebulization 2 (two) times daily.     clopidogrel 75 MG tablet  Commonly known as:  PLAVIX  Take 1 tablet (75 mg total) by mouth daily.     DSS 100 MG Caps  Take 100 mg by mouth 2 (two) times daily.     furosemide 20 MG tablet  Commonly known as:  LASIX  Take 1 tablet (20 mg total) by mouth daily.     guaiFENesin 600 MG 12 hr tablet  Commonly known as:  MUCINEX  Take 1 tablet (600 mg total) by mouth 2 (two) times daily.     levofloxacin 500 MG tablet  Commonly known as:  LEVAQUIN  Take 1 tablet (500 mg total) by mouth daily. For 10 days     metoprolol tartrate 25 MG tablet  Commonly known as:  LOPRESSOR  1/2  po bid     nitroGLYCERIN 0.4 MG SL tablet  Commonly known as:  NITROSTAT  Place 1 tablet (0.4 mg total) under the tongue every 5 (five) minutes as needed for chest pain.     pantoprazole 40 MG tablet  Commonly known as:  PROTONIX  Take 1 tablet (40 mg total) by mouth 2 (two) times daily before a meal.     polyethylene glycol packet  Commonly known as:  MIRALAX / GLYCOLAX  Take 17 g by mouth daily as needed.     rosuvastatin 5 MG tablet  Commonly known as:  CRESTOR  Take 1 tablet (5 mg total) by mouth daily.     VITAMIN B 12 PO  Inject as directed.       discharge planning total 45 minutes.  SignedGeorgann Housekeeper 03/17/2012, 8:20 AM

## 2012-03-17 NOTE — Progress Notes (Signed)
At this time he is taken to his vehicle, and his family are with him.  They have instructions to drive immediately to [facility] (they tell me they know exactly where it is and have been there before). We find his SP02 to be 91% on rm. Air just prior to d/c.  Pt. Has been, and continues to to be in no distress. Copies of his d/c instructions are sent with family, as is the prescriptions for 2 different strengths of Xanax. I hve just phoned report to a nurse at [facility]) named [place].

## 2012-03-17 NOTE — Progress Notes (Signed)
Occupational Therapy Treatment Patient Details Name: [name] [name] MRN: [id number] DOB: 02/08/1925 Today's Date: 03/17/2012 Time: 3086-5784 OT Time Calculation (min): 34 min  OT Assessment / Plan / Recommendation Comments on Treatment Session pt with good particpation , but fatigues quickly    Follow Up Recommendations  SNF       Equipment Recommendations  None recommended by OT          Plan Discharge plan needs to be updated    Precautions / Restrictions Precautions Precautions: Fall Precaution Comments: monitor O2 Restrictions Weight Bearing Restrictions: No       ADL  Grooming: Performed;Set up Where Assessed - Grooming: Unsupported sitting Upper Body Bathing: Performed;Minimal assistance Where Assessed - Upper Body Bathing: Unsupported sitting Lower Body Bathing: Performed;Moderate assistance Where Assessed - Lower Body Bathing: Supported sit to stand Toilet Transfer: Performed;Min Pension scheme manager Method: Sit to stand Toileting - Architect and Hygiene: Performed;Minimal assistance Where Assessed - Toileting Clothing Manipulation and Hygiene: Standing Transfers/Ambulation Related to ADLs: Fatigue and SOB continues to be limiting factor for pt.  Pt has now agreed to [facility] SNF for rehab.  Pt did most of this morning, but did need several rest breaks and verbal cues to take deep breathes      OT Goals ADL Goals ADL Goal: Grooming - Progress: Progressing toward goals ADL Goal: Toilet Transfer - Progress: Progressing toward goals ADL Goal: Toileting - Clothing Manipulation - Progress: Progressing toward goals  Visit Information  Last OT Received On: 03/17/12    Subjective Data  Subjective: I know i need to get stronger before i go home      Cognition  Cognition Overall Cognitive Status: Appears within functional limits for tasks assessed/performed Arousal/Alertness: Awake/alert Orientation Level: Appears intact for tasks assessed Behavior  During Session: [facility] for tasks performed    Mobility  Bed Mobility Bed Mobility: Supine to Sit Supine to Sit: 5: Supervision;With rails Transfers Transfers: Sit to Stand;Stand to Sit Sit to Stand: 4: Min guard;With upper extremity assist;With armrests Stand to Sit: 4: Min guard;Without upper extremity assist;With armrests Details for Transfer Assistance: verbal cues to use arm rests to push up and reach back          End of Session OT - End of Session Activity Tolerance: Patient limited by fatigue Patient left: in chair;with call bell/phone within reach  GO     [facility] [place] Kung 03/17/2012, 9:14 AM

## 2012-03-17 NOTE — Progress Notes (Signed)
Clinical Social Work Department CLINICAL SOCIAL WORK PLACEMENT NOTE 03/17/2012  Patient:  Kloss,Ignazio  Account Number:  [id number] Admit date:  03/12/2012  Clinical Social Worker:  Becky Sax, LCSW  Date/time:  03/15/2012 12:00 M  Clinical Social Work is seeking post-discharge placement for this patient at the following level of care:   SKILLED NURSING   (*CSW will update this form in Epic as items are completed)   03/15/2012  Patient/family provided with [facility] Department of Clinical Social Work's list of facilities offering this level of care within the geographic area requested by the patient (or if unable, by the patient's family).  03/15/2012  Patient/family informed of their freedom to choose among providers that offer the needed level of care, that participate in Medicare, Medicaid or managed care program needed by the patient, have an available bed and are willing to accept the patient.  03/15/2012  Patient/family informed of [facility]' ownership interest in [facility], as well as of the fact that they are under no obligation to receive care at this facility.  PASARR submitted to EDS on 03/15/2012 PASARR number received from [employer] on 03/15/2012  FL2 transmitted to all facilities in geographic area requested by pt/family on  03/15/2012 FL2 transmitted to all facilities within larger geographic area on 03/15/2012  Patient informed that his/her managed care company has contracts with or will negotiate with  certain facilities, including the following:     Patient/family informed of bed offers received:  03/17/2012 Patient [facility] bed at [facility] Physician recommends and patient chooses bed at  [facility]  [facility] on  03/17/2012 Patient to be transferred to facility by family  The following physician request were entered in Epic:   Additional Comments:

## 2012-03-17 NOTE — Progress Notes (Signed)
Patient cleared for discharge to [facility] home. Packet copied and placed in [place]. Family to transport. Patient and family at bedside agreeable to same.  Arilla Hice C. Jarl Sellitto MSW, LCSW [phone]

## 2012-03-19 LAB — CULTURE, BLOOD (ROUTINE X 2): Culture: NO GROWTH

## 2012-03-30 DIAGNOSIS — J441 Chronic obstructive pulmonary disease with (acute) exacerbation: Secondary | ICD-10-CM | POA: Diagnosis not present

## 2012-03-30 DIAGNOSIS — E119 Type 2 diabetes mellitus without complications: Secondary | ICD-10-CM | POA: Diagnosis not present

## 2012-03-30 DIAGNOSIS — J438 Other emphysema: Secondary | ICD-10-CM | POA: Diagnosis not present

## 2012-03-30 DIAGNOSIS — I129 Hypertensive chronic kidney disease with stage 1 through stage 4 chronic kidney disease, or unspecified chronic kidney disease: Secondary | ICD-10-CM | POA: Diagnosis not present

## 2012-03-30 DIAGNOSIS — I5031 Acute diastolic (congestive) heart failure: Secondary | ICD-10-CM | POA: Diagnosis not present

## 2012-04-01 DIAGNOSIS — I129 Hypertensive chronic kidney disease with stage 1 through stage 4 chronic kidney disease, or unspecified chronic kidney disease: Secondary | ICD-10-CM | POA: Diagnosis not present

## 2012-04-01 DIAGNOSIS — J441 Chronic obstructive pulmonary disease with (acute) exacerbation: Secondary | ICD-10-CM | POA: Diagnosis not present

## 2012-04-01 DIAGNOSIS — J438 Other emphysema: Secondary | ICD-10-CM | POA: Diagnosis not present

## 2012-04-01 DIAGNOSIS — I5031 Acute diastolic (congestive) heart failure: Secondary | ICD-10-CM | POA: Diagnosis not present

## 2012-04-05 DIAGNOSIS — J438 Other emphysema: Secondary | ICD-10-CM | POA: Diagnosis not present

## 2012-04-05 DIAGNOSIS — J441 Chronic obstructive pulmonary disease with (acute) exacerbation: Secondary | ICD-10-CM | POA: Diagnosis not present

## 2012-04-05 DIAGNOSIS — I129 Hypertensive chronic kidney disease with stage 1 through stage 4 chronic kidney disease, or unspecified chronic kidney disease: Secondary | ICD-10-CM | POA: Diagnosis not present

## 2012-04-05 DIAGNOSIS — I5031 Acute diastolic (congestive) heart failure: Secondary | ICD-10-CM | POA: Diagnosis not present

## 2012-04-07 DIAGNOSIS — J438 Other emphysema: Secondary | ICD-10-CM | POA: Diagnosis not present

## 2012-04-07 DIAGNOSIS — I129 Hypertensive chronic kidney disease with stage 1 through stage 4 chronic kidney disease, or unspecified chronic kidney disease: Secondary | ICD-10-CM | POA: Diagnosis not present

## 2012-04-07 DIAGNOSIS — I5031 Acute diastolic (congestive) heart failure: Secondary | ICD-10-CM | POA: Diagnosis not present

## 2012-04-07 DIAGNOSIS — J441 Chronic obstructive pulmonary disease with (acute) exacerbation: Secondary | ICD-10-CM | POA: Diagnosis not present

## 2012-04-08 ENCOUNTER — Ambulatory Visit (INDEPENDENT_AMBULATORY_CARE_PROVIDER_SITE_OTHER): Payer: BLUE CROSS/BLUE SHIELD | Admitting: Cardiovascular Disease

## 2012-04-08 ENCOUNTER — Encounter: Payer: Self-pay | Admitting: Cardiovascular Disease

## 2012-04-08 VITALS — BP 116/68 | HR 66 | Wt 182.0 lb

## 2012-04-08 DIAGNOSIS — R0600 Dyspnea, unspecified: Secondary | ICD-10-CM

## 2012-04-08 DIAGNOSIS — R339 Retention of urine, unspecified: Secondary | ICD-10-CM | POA: Diagnosis not present

## 2012-04-08 DIAGNOSIS — R0609 Other forms of dyspnea: Secondary | ICD-10-CM

## 2012-04-08 DIAGNOSIS — Z95810 Presence of automatic (implantable) cardiac defibrillator: Secondary | ICD-10-CM

## 2012-04-08 DIAGNOSIS — J449 Chronic obstructive pulmonary disease, unspecified: Secondary | ICD-10-CM | POA: Diagnosis not present

## 2012-04-08 DIAGNOSIS — I2510 Atherosclerotic heart disease of native coronary artery without angina pectoris: Secondary | ICD-10-CM

## 2012-04-08 DIAGNOSIS — I509 Heart failure, unspecified: Secondary | ICD-10-CM | POA: Diagnosis not present

## 2012-04-08 DIAGNOSIS — N400 Enlarged prostate without lower urinary tract symptoms: Secondary | ICD-10-CM | POA: Diagnosis not present

## 2012-04-08 MED ORDER — ROSUVASTATIN CALCIUM 5 MG PO TABS: 5.0000 mg | ORAL_TABLET | Freq: Every day | ORAL | Status: DC

## 2012-04-08 NOTE — Assessment & Plan Note (Signed)
Normal function no discharges F/U Dr Graciela Husbands

## 2012-04-08 NOTE — Assessment & Plan Note (Signed)
Euvolemic No lasix for now since he had elevated Cr and prerenal azotemia with recent infection/sepsis

## 2012-04-08 NOTE — Patient Instructions (Addendum)
Your physician recommends that you schedule a follow-up appointment in:  3 MONTHS WITH  DR NISHAN  Your physician recommends that you continue on your current medications as directed. Please refer to the Current Medication list given to you today.  

## 2012-04-08 NOTE — Progress Notes (Signed)
Patient ID: [name] [name], male   DOB: 08-13-1925, 79 y.o.   MRN: [id number] [name] [name] is seen today after his AICD implant. He is a sudden death survivor with 3 stents. EF by MRI is 31% with a large scar. 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 He still has good quality of life and family support. He has returned to work. He has stopped smoking since his arrest . He denies SSCP, , or syncope.l Device has not fired Wound healed well. Compliant with meds and no SSCP. His grandaughter plays softball with my daughter and I get to see [name] [name] at the ballfield. He looks good Recent exacerbation of breathing. CXR normal no CHF BNP normal and improved after seeing Dr Sherene Sires and adjusting inhalers. Also has thalesemia. Getting iv iron Hb around 10 Does not want to have bone marrow biopsy  05/03/09 Stent to Ramus, circ and RCA Given number of stents will stay on Plavix indefinately   Recent hospitalization for UTI had some elevation in Cr and finished antibiotics  Echo during hospitalization EF 55%  Sees Dr Cassell Smiles and still needs I/O cath at times  ROS: Denies fever, malais, weight loss, blurry vision, decreased visual acuity, cough, sputum, SOB, hemoptysis, pleuritic pain, palpitaitons, heartburn, abdominal pain, melena, lower extremity edema, claudication, or rash.  All other systems reviewed and negative  General: Affect appropriate Elderly [place] male HEENT: normal Neck supple with no adenopathy JVP normal no bruits no thyromegaly Lungs clear with no wheezing  Pursed lips Heart:  S1/S2 no murmur, no rub, gallop or click PMI normal Abdomen: benighn, BS positve, no tenderness, no AAA no bruit.  No HSM or HJR Distal pulses intact with no bruits No edema Neuro non-focal Skin warm and dry No muscular weakness AICD under left clavicle   Current Outpatient Prescriptions  Medication Sig Dispense Refill  . albuterol (PROVENTIL) (5 MG/ML) 0.5% nebulizer solution Take 0.5 mLs  (2.5 mg total) by nebulization 4 (four) times daily. And q3 hrs prn for SOB  20 mL  3  . ALPRAZolam (XANAX) 0.25 MG tablet Take 1 tablet (0.25 mg total) by mouth every 6 (six) hours as needed for sleep.  30 tablet  3  . ALPRAZolam (XANAX) 0.5 MG tablet Take 1 tablet (0.5 mg total) by mouth at bedtime.  30 tablet  3  . arformoterol (BROVANA) 15 MCG/2ML NEBU Take 2 mLs (15 mcg total) by nebulization 2 (two) times daily.  120 mL  3  . aspirin EC 81 MG tablet Take 81 mg by mouth daily.      . Azelastine-Fluticasone 137-50 MCG/ACT SUSP Place 2 sprays into the nose daily.  23 g  3  . budesonide (PULMICORT) 0.5 MG/2ML nebulizer solution Take 2 mLs (0.5 mg total) by nebulization 2 (two) times daily.  10 mL  3  . clopidogrel (PLAVIX) 75 MG tablet Take 1 tablet (75 mg total) by mouth daily.  30 tablet  12  . docusate sodium 100 MG CAPS Take 100 mg by mouth 2 (two) times daily.  10 capsule    . metoprolol tartrate (LOPRESSOR) 25 MG tablet 1/2 po bid      . nitroGLYCERIN (NITROSTAT) 0.4 MG SL tablet Place 1 tablet (0.4 mg total) under the tongue every 5 (five) minutes as needed for chest pain.  25 tablet  4  . polyethylene glycol (MIRALAX / GLYCOLAX) packet Take 17 g by mouth daily as needed.  14 each  3  . rosuvastatin (CRESTOR) 5  MG tablet Take 1 tablet (5 mg total) by mouth daily.       No current facility-administered medications for this visit.    Allergies  Review of patient's allergies indicates no known allergies.  Electrocardiogram:SR rate 66 PAD otherwise normal  Assessment and Plan

## 2012-04-08 NOTE — Assessment & Plan Note (Signed)
Antibiotics done.  PRN I/O cath  F/U Dr Cassell Smiles

## 2012-04-08 NOTE — Assessment & Plan Note (Signed)
Stable with no angina and good activity level.  Continue medical Rx  

## 2012-04-08 NOTE — Assessment & Plan Note (Signed)
No wheezing Euvolemic Combination of COPD and CHF  Stable Bronchitis form winter time resolved

## 2012-04-09 ENCOUNTER — Encounter: Payer: Medicare Other | Admitting: Internal Medicine

## 2012-04-13 DIAGNOSIS — J441 Chronic obstructive pulmonary disease with (acute) exacerbation: Secondary | ICD-10-CM | POA: Diagnosis not present

## 2012-04-13 DIAGNOSIS — I5031 Acute diastolic (congestive) heart failure: Secondary | ICD-10-CM | POA: Diagnosis not present

## 2012-04-13 DIAGNOSIS — I129 Hypertensive chronic kidney disease with stage 1 through stage 4 chronic kidney disease, or unspecified chronic kidney disease: Secondary | ICD-10-CM | POA: Diagnosis not present

## 2012-04-13 DIAGNOSIS — J438 Other emphysema: Secondary | ICD-10-CM | POA: Diagnosis not present

## 2012-04-15 ENCOUNTER — Other Ambulatory Visit: Payer: Self-pay | Admitting: Internal Medicine

## 2012-04-15 DIAGNOSIS — J438 Other emphysema: Secondary | ICD-10-CM | POA: Diagnosis not present

## 2012-04-15 DIAGNOSIS — J441 Chronic obstructive pulmonary disease with (acute) exacerbation: Secondary | ICD-10-CM | POA: Diagnosis not present

## 2012-04-15 DIAGNOSIS — I129 Hypertensive chronic kidney disease with stage 1 through stage 4 chronic kidney disease, or unspecified chronic kidney disease: Secondary | ICD-10-CM | POA: Diagnosis not present

## 2012-04-15 DIAGNOSIS — I5031 Acute diastolic (congestive) heart failure: Secondary | ICD-10-CM | POA: Diagnosis not present

## 2012-04-16 ENCOUNTER — Other Ambulatory Visit: Payer: Self-pay | Admitting: Internal Medicine

## 2012-04-16 DIAGNOSIS — J438 Other emphysema: Secondary | ICD-10-CM | POA: Diagnosis not present

## 2012-04-16 DIAGNOSIS — I5031 Acute diastolic (congestive) heart failure: Secondary | ICD-10-CM | POA: Diagnosis not present

## 2012-04-16 DIAGNOSIS — I129 Hypertensive chronic kidney disease with stage 1 through stage 4 chronic kidney disease, or unspecified chronic kidney disease: Secondary | ICD-10-CM | POA: Diagnosis not present

## 2012-04-16 DIAGNOSIS — J441 Chronic obstructive pulmonary disease with (acute) exacerbation: Secondary | ICD-10-CM | POA: Diagnosis not present

## 2012-04-19 ENCOUNTER — Other Ambulatory Visit: Payer: Self-pay | Admitting: *Deleted

## 2012-04-19 ENCOUNTER — Other Ambulatory Visit: Payer: Self-pay | Admitting: Internal Medicine

## 2012-04-19 DIAGNOSIS — J438 Other emphysema: Secondary | ICD-10-CM | POA: Diagnosis not present

## 2012-04-19 DIAGNOSIS — I5031 Acute diastolic (congestive) heart failure: Secondary | ICD-10-CM | POA: Diagnosis not present

## 2012-04-19 DIAGNOSIS — I129 Hypertensive chronic kidney disease with stage 1 through stage 4 chronic kidney disease, or unspecified chronic kidney disease: Secondary | ICD-10-CM | POA: Diagnosis not present

## 2012-04-19 DIAGNOSIS — J441 Chronic obstructive pulmonary disease with (acute) exacerbation: Secondary | ICD-10-CM | POA: Diagnosis not present

## 2012-04-19 DIAGNOSIS — I2510 Atherosclerotic heart disease of native coronary artery without angina pectoris: Secondary | ICD-10-CM

## 2012-04-19 MED ORDER — ROSUVASTATIN CALCIUM 5 MG PO TABS: 5.0000 mg | ORAL_TABLET | Freq: Every day | ORAL | Status: DC

## 2012-04-21 DIAGNOSIS — I129 Hypertensive chronic kidney disease with stage 1 through stage 4 chronic kidney disease, or unspecified chronic kidney disease: Secondary | ICD-10-CM | POA: Diagnosis not present

## 2012-04-21 DIAGNOSIS — J438 Other emphysema: Secondary | ICD-10-CM | POA: Diagnosis not present

## 2012-04-21 DIAGNOSIS — I5031 Acute diastolic (congestive) heart failure: Secondary | ICD-10-CM | POA: Diagnosis not present

## 2012-04-21 DIAGNOSIS — J441 Chronic obstructive pulmonary disease with (acute) exacerbation: Secondary | ICD-10-CM | POA: Diagnosis not present

## 2012-04-22 DIAGNOSIS — J438 Other emphysema: Secondary | ICD-10-CM | POA: Diagnosis not present

## 2012-04-22 DIAGNOSIS — I129 Hypertensive chronic kidney disease with stage 1 through stage 4 chronic kidney disease, or unspecified chronic kidney disease: Secondary | ICD-10-CM | POA: Diagnosis not present

## 2012-04-22 DIAGNOSIS — J441 Chronic obstructive pulmonary disease with (acute) exacerbation: Secondary | ICD-10-CM | POA: Diagnosis not present

## 2012-04-22 DIAGNOSIS — I5031 Acute diastolic (congestive) heart failure: Secondary | ICD-10-CM | POA: Diagnosis not present

## 2012-04-23 DIAGNOSIS — I5031 Acute diastolic (congestive) heart failure: Secondary | ICD-10-CM | POA: Diagnosis not present

## 2012-04-23 DIAGNOSIS — J441 Chronic obstructive pulmonary disease with (acute) exacerbation: Secondary | ICD-10-CM | POA: Diagnosis not present

## 2012-04-23 DIAGNOSIS — I129 Hypertensive chronic kidney disease with stage 1 through stage 4 chronic kidney disease, or unspecified chronic kidney disease: Secondary | ICD-10-CM | POA: Diagnosis not present

## 2012-04-23 DIAGNOSIS — J438 Other emphysema: Secondary | ICD-10-CM | POA: Diagnosis not present

## 2012-04-26 DIAGNOSIS — J441 Chronic obstructive pulmonary disease with (acute) exacerbation: Secondary | ICD-10-CM | POA: Diagnosis not present

## 2012-04-26 DIAGNOSIS — J438 Other emphysema: Secondary | ICD-10-CM | POA: Diagnosis not present

## 2012-04-26 DIAGNOSIS — I5031 Acute diastolic (congestive) heart failure: Secondary | ICD-10-CM | POA: Diagnosis not present

## 2012-04-26 DIAGNOSIS — I129 Hypertensive chronic kidney disease with stage 1 through stage 4 chronic kidney disease, or unspecified chronic kidney disease: Secondary | ICD-10-CM | POA: Diagnosis not present

## 2012-04-27 DIAGNOSIS — I5031 Acute diastolic (congestive) heart failure: Secondary | ICD-10-CM | POA: Diagnosis not present

## 2012-04-27 DIAGNOSIS — J441 Chronic obstructive pulmonary disease with (acute) exacerbation: Secondary | ICD-10-CM | POA: Diagnosis not present

## 2012-04-27 DIAGNOSIS — J438 Other emphysema: Secondary | ICD-10-CM | POA: Diagnosis not present

## 2012-04-27 DIAGNOSIS — I129 Hypertensive chronic kidney disease with stage 1 through stage 4 chronic kidney disease, or unspecified chronic kidney disease: Secondary | ICD-10-CM | POA: Diagnosis not present

## 2012-04-28 DIAGNOSIS — N401 Enlarged prostate with lower urinary tract symptoms: Secondary | ICD-10-CM | POA: Diagnosis not present

## 2012-04-28 DIAGNOSIS — R339 Retention of urine, unspecified: Secondary | ICD-10-CM | POA: Diagnosis not present

## 2012-04-28 DIAGNOSIS — R350 Frequency of micturition: Secondary | ICD-10-CM | POA: Diagnosis not present

## 2012-04-29 DIAGNOSIS — I5031 Acute diastolic (congestive) heart failure: Secondary | ICD-10-CM | POA: Diagnosis not present

## 2012-04-29 DIAGNOSIS — J441 Chronic obstructive pulmonary disease with (acute) exacerbation: Secondary | ICD-10-CM | POA: Diagnosis not present

## 2012-04-29 DIAGNOSIS — I129 Hypertensive chronic kidney disease with stage 1 through stage 4 chronic kidney disease, or unspecified chronic kidney disease: Secondary | ICD-10-CM | POA: Diagnosis not present

## 2012-04-29 DIAGNOSIS — J438 Other emphysema: Secondary | ICD-10-CM | POA: Diagnosis not present

## 2012-04-30 ENCOUNTER — Ambulatory Visit (INDEPENDENT_AMBULATORY_CARE_PROVIDER_SITE_OTHER): Payer: Medicare Other | Admitting: Cardiology

## 2012-04-30 ENCOUNTER — Encounter: Payer: Self-pay | Admitting: Internal Medicine

## 2012-04-30 ENCOUNTER — Encounter: Payer: Self-pay | Admitting: Cardiology

## 2012-04-30 VITALS — BP 119/70 | HR 69 | Ht 72.0 in | Wt 182.0 lb

## 2012-04-30 DIAGNOSIS — Z95810 Presence of automatic (implantable) cardiac defibrillator: Secondary | ICD-10-CM | POA: Diagnosis not present

## 2012-04-30 DIAGNOSIS — I255 Ischemic cardiomyopathy: Secondary | ICD-10-CM

## 2012-04-30 DIAGNOSIS — I2510 Atherosclerotic heart disease of native coronary artery without angina pectoris: Secondary | ICD-10-CM | POA: Diagnosis not present

## 2012-04-30 DIAGNOSIS — I5022 Chronic systolic (congestive) heart failure: Secondary | ICD-10-CM | POA: Diagnosis not present

## 2012-04-30 DIAGNOSIS — I469 Cardiac arrest, cause unspecified: Secondary | ICD-10-CM | POA: Diagnosis not present

## 2012-04-30 DIAGNOSIS — I2589 Other forms of chronic ischemic heart disease: Secondary | ICD-10-CM | POA: Diagnosis not present

## 2012-04-30 LAB — ICD DEVICE OBSERVATION
AL IMPEDENCE ICD: 400 Ohm
AL THRESHOLD: 0.75 V
HV IMPEDENCE: 74 Ohm
RV LEAD THRESHOLD: 1 V
TZON-0003SLOWVT: 335 ms
TZON-0004SLOWVT: 30
VENTRICULAR PACING ICD: 0 pct
VF: 0

## 2012-04-30 NOTE — Patient Instructions (Addendum)
Your physician recommends that you schedule a follow-up appointment in: 3 months with Device clinic  Your physician wants you to follow-up in: 1 year with Dr Johney Frame Bonita Quin will receive a reminder letter in the mail two months in advance. If you don't receive a letter, please call our office to schedule the follow-up appointment.

## 2012-04-30 NOTE — Progress Notes (Signed)
ELECTROPHYSIOLOGY OFFICE NOTE  Patient ID: [name] [name] MRN: [id number], DOB/AGE: 02/19/1925   Date of Visit: 04/30/2012  Primary Physician: [name], MD Primary Cardiologist: Eden Emms, MD Primary EP: Johney Frame, MD Reason for Visit: EP/device follow-up  History of Present Illness  [name] [name] is a pleasant 79 year old man with an ischemic CM s/p ICD implant, chronic systolic HF and CAD presents today for routine electrophysiology followup. Since last being seen in our clinic, he reports he is doing well. He has no complaints. Today, he specifically denies chest pain or shortness of breath. He denies palpitations, dizziness, near syncope or syncope. He denies LE swelling, orthopnea, PND or recent weight gain. He denies ICD shocks. Mr. Hagemeister reports he is compliant and tolerating medications without difficulty.  Past Medical History Past Medical History  Diagnosis Date  . CAD (coronary artery disease)   . Ischemic cardiomyopathy   . CHF (congestive heart failure)   . Dyslipidemia   . Diverticulosis of colon (without mention of hemorrhage)   . Diabetes mellitus   . Anemia, unspecified   . Unspecified disorder resulting from impaired renal function   . Chronic airway obstruction, not elsewhere classified     on o2 at night  . Prostatic hypertrophy     hx of  . Retention of urine, unspecified   . ICD (implantable cardiac defibrillator) in place     st jude  . Emphysema   . Hemorrhoids   . MI, old 2010 or 2011  . Macular degeneration   . Cataracts, bilateral     Past Surgical History Past Surgical History  Procedure Laterality Date  . Pci      4/11  . Cardiac defibrillator placement  2011    st jude  . Pacemaker insertion  2011  . Tonsillectomy  79 yo  . Cataract extraction       Allergies/Intolerances No Known Allergies  Current Home Medications Current Outpatient Prescriptions  Medication Sig Dispense Refill  . albuterol (PROVENTIL) (5 MG/ML) 0.5% nebulizer  solution Take 0.5 mLs (2.5 mg total) by nebulization 4 (four) times daily. And q3 hrs prn for SOB  20 mL  3  . ALPRAZolam (XANAX) 0.25 MG tablet Take 1 tablet (0.25 mg total) by mouth every 6 (six) hours as needed for sleep.  30 tablet  3  . ALPRAZolam (XANAX) 0.5 MG tablet Take 1 tablet (0.5 mg total) by mouth at bedtime.  30 tablet  3  . arformoterol (BROVANA) 15 MCG/2ML NEBU Take 2 mLs (15 mcg total) by nebulization 2 (two) times daily.  120 mL  3  . aspirin EC 81 MG tablet Take 81 mg by mouth daily.      . Azelastine-Fluticasone 137-50 MCG/ACT SUSP Place 2 sprays into the nose daily.  23 g  3  . budesonide (PULMICORT) 0.5 MG/2ML nebulizer solution Take 2 mLs (0.5 mg total) by nebulization 2 (two) times daily.  10 mL  3  . clopidogrel (PLAVIX) 75 MG tablet Take 1 tablet (75 mg total) by mouth daily.  30 tablet  12  . docusate sodium 100 MG CAPS Take 100 mg by mouth 2 (two) times daily.  10 capsule    . losartan (COZAAR) 25 MG tablet Take 1 tablet by mouth daily.      . metoprolol tartrate (LOPRESSOR) 25 MG tablet 1/2 po bid      . nitroGLYCERIN (NITROSTAT) 0.4 MG SL tablet Place 1 tablet (0.4 mg total) under the tongue every 5 (five) minutes as  needed for chest pain.  25 tablet  4  . pantoprazole (PROTONIX) 40 MG tablet Take 1 tablet by mouth daily.      . polyethylene glycol (MIRALAX / GLYCOLAX) packet Take 17 g by mouth daily as needed.  14 each  3  . rosuvastatin (CRESTOR) 5 MG tablet Take 1 tablet (5 mg total) by mouth daily.  30 tablet  12   No current facility-administered medications for this visit.    Social History Social History  . Marital Status: Married   Occupational History  . dentist    Social History Main Topics  . Smoking status: Former Smoker    Types: Cigarettes    Quit date: 04/27/2009  . Smokeless tobacco: Never Used  . Alcohol Use: Yes     Comment: occasional beer  . Drug Use: No   Social History Narrative   Lives in a house with stairs, which he needs to  use, with his wife.  Wife is not able-bodied.  Does not use a cane or walker, but is unsteady on his feet.        Judeth Cornfield Swopes:  [phone] cell, [phone], daughter, POA.     Oliver Barre:  [phone], nephew   Cecilie Kicks:  [phone], daughter    Review of Systems General: No chills, fever, night sweats or weight changes Cardiovascular: No chest pain, dyspnea on exertion, edema, orthopnea, palpitations, paroxysmal nocturnal dyspnea Dermatological: No rash, lesions or masses Respiratory: No cough, dyspnea Urologic: No hematuria, dysuria Abdominal: No nausea, vomiting, diarrhea, bright red blood per rectum, melena, or hematemesis Neurologic: No visual changes, weakness, changes in mental status All other systems reviewed and are otherwise negative except as noted above.  Physical Exam Blood pressure 119/70, pulse 69, height 6' (1.829 m), weight 182 lb (82.555 kg), SpO2 95.00%.  General: Well developed, well appearing 79 year old male in no acute distress. HEENT: Normocephalic, atraumatic. EOMs intact. Sclera nonicteric. Oropharynx clear.  Neck: Supple. No JVD. Lungs: Respirations regular and unlabored, CTA bilaterally. No wheezes, rales or rhonchi. Heart: RRR. S1, S2 present. No murmurs, rub, S3 or S4. Abdomen: Soft, non-distended.  Extremities: No clubbing, cyanosis or edema. DP/PT/Radials 2+ and equal bilaterally. Psych: Normal affect. Neuro: Alert and oriented X 3. Moves all extremities spontaneously.   Diagnostics Device interrogation today - Normal dual chamber ICD function. Thresholds and sensing consistent with previous device measurements. Impedance trends stable over time. No evidence of any ventricular arrhythmias. No mode switches. Histogram distribution appropriate for patient and level of activity. Heart failure diagnostics reviewed and stable. No changes made this session. Device programmed at appropriate safety margins. Device programmed to optimize intrinsic  conduction. Estimated longevity 6.8 - 7.5 years.   Assessment and Plan 1. Ishemic CM s/p ICD implant Normal ICD function No arrhythmias No programming changes made Continue routine remote device follow-up every 3 months Return to clinic for follow-up with Dr. Johney Frame in one year 2. Chronic systolic HF Stable; euvolemic by exam Continue medical therapy 3. CAD Stable without anginal symptoms Continue medical therapy and routine follow-up with Dr. Eden Emms  Signed, Clint Biello, PA-C 04/30/2012, 1:07 PM

## 2012-05-03 DIAGNOSIS — I129 Hypertensive chronic kidney disease with stage 1 through stage 4 chronic kidney disease, or unspecified chronic kidney disease: Secondary | ICD-10-CM | POA: Diagnosis not present

## 2012-05-03 DIAGNOSIS — I5031 Acute diastolic (congestive) heart failure: Secondary | ICD-10-CM | POA: Diagnosis not present

## 2012-05-03 DIAGNOSIS — J441 Chronic obstructive pulmonary disease with (acute) exacerbation: Secondary | ICD-10-CM | POA: Diagnosis not present

## 2012-05-03 DIAGNOSIS — J438 Other emphysema: Secondary | ICD-10-CM | POA: Diagnosis not present

## 2012-05-04 DIAGNOSIS — J438 Other emphysema: Secondary | ICD-10-CM | POA: Diagnosis not present

## 2012-05-04 DIAGNOSIS — J441 Chronic obstructive pulmonary disease with (acute) exacerbation: Secondary | ICD-10-CM | POA: Diagnosis not present

## 2012-05-04 DIAGNOSIS — I5031 Acute diastolic (congestive) heart failure: Secondary | ICD-10-CM | POA: Diagnosis not present

## 2012-05-04 DIAGNOSIS — I129 Hypertensive chronic kidney disease with stage 1 through stage 4 chronic kidney disease, or unspecified chronic kidney disease: Secondary | ICD-10-CM | POA: Diagnosis not present

## 2012-05-05 DIAGNOSIS — J438 Other emphysema: Secondary | ICD-10-CM | POA: Diagnosis not present

## 2012-05-05 DIAGNOSIS — J441 Chronic obstructive pulmonary disease with (acute) exacerbation: Secondary | ICD-10-CM | POA: Diagnosis not present

## 2012-05-05 DIAGNOSIS — I129 Hypertensive chronic kidney disease with stage 1 through stage 4 chronic kidney disease, or unspecified chronic kidney disease: Secondary | ICD-10-CM | POA: Diagnosis not present

## 2012-05-05 DIAGNOSIS — I5031 Acute diastolic (congestive) heart failure: Secondary | ICD-10-CM | POA: Diagnosis not present

## 2012-05-10 DIAGNOSIS — I5031 Acute diastolic (congestive) heart failure: Secondary | ICD-10-CM | POA: Diagnosis not present

## 2012-05-10 DIAGNOSIS — J441 Chronic obstructive pulmonary disease with (acute) exacerbation: Secondary | ICD-10-CM | POA: Diagnosis not present

## 2012-05-10 DIAGNOSIS — I129 Hypertensive chronic kidney disease with stage 1 through stage 4 chronic kidney disease, or unspecified chronic kidney disease: Secondary | ICD-10-CM | POA: Diagnosis not present

## 2012-05-10 DIAGNOSIS — J438 Other emphysema: Secondary | ICD-10-CM | POA: Diagnosis not present

## 2012-05-12 DIAGNOSIS — I129 Hypertensive chronic kidney disease with stage 1 through stage 4 chronic kidney disease, or unspecified chronic kidney disease: Secondary | ICD-10-CM | POA: Diagnosis not present

## 2012-05-12 DIAGNOSIS — J438 Other emphysema: Secondary | ICD-10-CM | POA: Diagnosis not present

## 2012-05-12 DIAGNOSIS — J441 Chronic obstructive pulmonary disease with (acute) exacerbation: Secondary | ICD-10-CM | POA: Diagnosis not present

## 2012-05-12 DIAGNOSIS — I5031 Acute diastolic (congestive) heart failure: Secondary | ICD-10-CM | POA: Diagnosis not present

## 2012-05-13 DIAGNOSIS — J438 Other emphysema: Secondary | ICD-10-CM | POA: Diagnosis not present

## 2012-05-13 DIAGNOSIS — I5031 Acute diastolic (congestive) heart failure: Secondary | ICD-10-CM | POA: Diagnosis not present

## 2012-05-13 DIAGNOSIS — J441 Chronic obstructive pulmonary disease with (acute) exacerbation: Secondary | ICD-10-CM | POA: Diagnosis not present

## 2012-05-13 DIAGNOSIS — I129 Hypertensive chronic kidney disease with stage 1 through stage 4 chronic kidney disease, or unspecified chronic kidney disease: Secondary | ICD-10-CM | POA: Diagnosis not present

## 2012-05-19 DIAGNOSIS — I5031 Acute diastolic (congestive) heart failure: Secondary | ICD-10-CM | POA: Diagnosis not present

## 2012-05-19 DIAGNOSIS — J438 Other emphysema: Secondary | ICD-10-CM | POA: Diagnosis not present

## 2012-05-19 DIAGNOSIS — J441 Chronic obstructive pulmonary disease with (acute) exacerbation: Secondary | ICD-10-CM | POA: Diagnosis not present

## 2012-05-19 DIAGNOSIS — I129 Hypertensive chronic kidney disease with stage 1 through stage 4 chronic kidney disease, or unspecified chronic kidney disease: Secondary | ICD-10-CM | POA: Diagnosis not present

## 2012-05-24 DIAGNOSIS — E782 Mixed hyperlipidemia: Secondary | ICD-10-CM | POA: Diagnosis not present

## 2012-05-24 DIAGNOSIS — J449 Chronic obstructive pulmonary disease, unspecified: Secondary | ICD-10-CM | POA: Diagnosis not present

## 2012-05-24 DIAGNOSIS — I10 Essential (primary) hypertension: Secondary | ICD-10-CM | POA: Diagnosis not present

## 2012-05-24 DIAGNOSIS — E119 Type 2 diabetes mellitus without complications: Secondary | ICD-10-CM | POA: Diagnosis not present

## 2012-05-27 DIAGNOSIS — J438 Other emphysema: Secondary | ICD-10-CM | POA: Diagnosis not present

## 2012-05-27 DIAGNOSIS — I129 Hypertensive chronic kidney disease with stage 1 through stage 4 chronic kidney disease, or unspecified chronic kidney disease: Secondary | ICD-10-CM | POA: Diagnosis not present

## 2012-05-27 DIAGNOSIS — I5031 Acute diastolic (congestive) heart failure: Secondary | ICD-10-CM | POA: Diagnosis not present

## 2012-05-27 DIAGNOSIS — J441 Chronic obstructive pulmonary disease with (acute) exacerbation: Secondary | ICD-10-CM | POA: Diagnosis not present

## 2012-07-09 ENCOUNTER — Encounter: Payer: Self-pay | Admitting: Cardiovascular Disease

## 2012-08-02 ENCOUNTER — Ambulatory Visit (INDEPENDENT_AMBULATORY_CARE_PROVIDER_SITE_OTHER): Payer: Medicare Other | Admitting: *Deleted

## 2012-08-02 DIAGNOSIS — I2589 Other forms of chronic ischemic heart disease: Secondary | ICD-10-CM

## 2012-08-02 DIAGNOSIS — I509 Heart failure, unspecified: Secondary | ICD-10-CM

## 2012-08-02 DIAGNOSIS — I255 Ischemic cardiomyopathy: Secondary | ICD-10-CM

## 2012-08-02 LAB — ICD DEVICE OBSERVATION
AL THRESHOLD: 0.75 V
ATRIAL PACING ICD: 0.08 pct
BAMS-0001: 150 {beats}/min
DEV-0020ICD: NEGATIVE
PACEART VT: 0
RV LEAD IMPEDENCE ICD: 387.5 Ohm
RV LEAD THRESHOLD: 1.25 V
TOT-0006: 20111117000000
TOT-0009: 3
TZON-0003SLOWVT: 335 ms
TZON-0004SLOWVT: 30
VENTRICULAR PACING ICD: 0.06 pct

## 2012-08-02 NOTE — Progress Notes (Signed)
ICD check with [facility] in office.

## 2012-08-11 ENCOUNTER — Encounter: Payer: Self-pay | Admitting: Internal Medicine

## 2012-08-23 ENCOUNTER — Telehealth: Payer: Self-pay | Admitting: Internal Medicine

## 2012-08-23 MED ORDER — FORMOTEROL FUMARATE 20 MCG/2ML IN NEBU: 20.0000 ug | INHALATION_SOLUTION | Freq: Two times a day (BID) | RESPIRATORY_TRACT | Status: DC

## 2012-08-23 MED ORDER — BUDESONIDE 0.5 MG/2ML IN SUSP: 0.5000 mg | Freq: Two times a day (BID) | RESPIRATORY_TRACT | Status: DC

## 2012-08-23 NOTE — Addendum Note (Signed)
Addended by: Nita Sells on: 08/23/2012 05:36 PM   Modules accepted: Orders

## 2012-08-23 NOTE — Telephone Encounter (Signed)
LMOM x 1 

## 2012-08-23 NOTE — Telephone Encounter (Signed)
Ok to use budesonide and performist

## 2012-08-23 NOTE — Telephone Encounter (Signed)
Rx printed and will be given to [facility] tomorrow to sign. Needs to be faxed to [facility] @ [phone].

## 2012-08-23 NOTE — Telephone Encounter (Signed)
Samples given of performist  No budesonide available

## 2012-08-23 NOTE — Telephone Encounter (Addendum)
Pt daughter called in regards to getting refill of pt medication. Pt has been out of nebs for almost one week now and needs new rx called in. Daughter wants to come pick up a sample anough to last until they received nebs in mail x 2-3 days from now.  Pt taking Budesonide neb and was originally rxd [place] but has been taking Performist neb in its placed. Performist not rxd by Dr Madaline Guthrie unsure where this was rxd to pt. Daughter states that she is wanting to get "something" rxd for him.  Rxs need to be sent to [facility] @[phone].  Dr Sherene Sires, we can send the Budesonide neb but which of the two other nebs does pt need to be prescibed? Brovana or Performist? Please advise thanks.   Pt also would like to come pick up a samples of each of the two nebs before 530 today since her father has been out of nebs for the past few days. Thanks.

## 2012-08-24 ENCOUNTER — Telehealth: Payer: Self-pay | Admitting: Internal Medicine

## 2012-08-24 MED ORDER — FORMOTEROL FUMARATE 20 MCG/2ML IN NEBU: 20.0000 ug | INHALATION_SOLUTION | Freq: Two times a day (BID) | RESPIRATORY_TRACT | Status: DC

## 2012-08-24 MED ORDER — BUDESONIDE 0.5 MG/2ML IN SUSP: 0.5000 mg | Freq: Two times a day (BID) | RESPIRATORY_TRACT | Status: DC

## 2012-08-24 NOTE — Telephone Encounter (Signed)
Will forward message to MW. Rx has been given to MW to sign.

## 2012-08-24 NOTE — Telephone Encounter (Signed)
(  continued)  Would like to know why we are delaying on getting these medications called in & asks this be taken care of asap & to call her when this has been faxed to [facility].  [name] [name]

## 2012-08-24 NOTE — Telephone Encounter (Signed)
Medications were faxed this morning to [facility] @ [phone] for Pulmicort and Performist. Pt daughter aware.

## 2012-08-24 NOTE — Telephone Encounter (Signed)
Rx faxed to [facility] @ [phone] for Pulmicort and Performist.

## 2012-08-24 NOTE — Telephone Encounter (Signed)
Rx for nebs sent this morning. Brett Canales states that number given to me to fax Rx this morning was incorrect. Rx along with neb rx needs to be sent to [facility]--they will send medications to pharmacy.   Dr Sherene Sires please advise of these updated OV notes we spoke of this morning for Medicare to be sent with Rx for nebs. Pt daughter is requesting that this be taken care of ASAP d/t pt being out of neb meds currently.

## 2012-08-24 NOTE — Telephone Encounter (Signed)
OV note cannot be produced d/t MW never being the prescriber the medication (Performist) Medicare will not take the old OV note bc its does not state anywhere in it that Performist has been used by patient or that it is doing him any good. Dr Sherene Sires recommends that pt make an appt to come in ASAP to review medications and get his nebs straight so that Medicare will cover them. Dr Sherene Sires states that at visit pt will be given samples to help cover him until he receives his new medication.  (See message from 08/23/12 for further information)  We apologize for the inconvenience, but per Medicare guidelines, an OV note stating good control with neb medication has to be produced before it can be filled.   LMOM for EC [name] x 1

## 2012-08-25 ENCOUNTER — Ambulatory Visit: Payer: Medicare Other | Admitting: Internal Medicine

## 2012-08-25 NOTE — Telephone Encounter (Signed)
Pt's daughter Judeth Cornfield) called back requesting samples of perforomist.  She can be reached @ [phone]. Leanora Ivanoff

## 2012-08-25 NOTE — Telephone Encounter (Signed)
I spoke with daughter and scheduled pt to come in to see MW today at 3:00. She stated pt has been out of his meds x 2 weeks now and can tell it is taking a toll on him. She stated he needs this ASAP. I also called Melissa and she is goingt o check who has been prescribing the performist for pt and call us back. Willa wait call back.

## 2012-08-25 NOTE — Telephone Encounter (Signed)
I spoke with [name] [name]. She stated Dr. Eula Listen made the change to the performists. When pt comes in we also need to document the change for the budesonide to the 0.5 strength. Also under the assessment it needs to state pt has been on these medications w/ dosages and needs to continue these medications. Will forward to MW as an Financial planner

## 2012-08-25 NOTE — Telephone Encounter (Signed)
LMOM x 2 

## 2012-08-25 NOTE — Telephone Encounter (Signed)
Spoke with daughter Judeth Cornfield. Daughter cancelled appt with MW today at 3pm and requesting samples. Daughter states that father does not feel well today and does not want to come in. I explained to the daughter the importance of keeping this appt with MW/TP to review meds and getting his medications/neb meds straightened out. I explained to her that we cannot keep supplying meds by samples to her father and him cancelling appts---told daughter that he will either have to keep follow up appt or pay for medications out of pocket. Per Dr Sherene Sires when we spoke yesterday afternoon, He is not sure if pt even needs to be on these neb meds any longer or exactly "which" nebs he needs to be on if any. I explained to the daughter that we were unable to get these meds approved through insurance d/t Dr Sherene Sires not being the original prescriber and there being no documentation anywhere in his chart.  Daughter states that Rosalyn Gess was changed to Perforomist by Dr Tama Headings explained to her that even though she now knows who changed his medications, it still doesn't help [place] with the documentation part.  Daughter is very frustrated and states that she will ask her father if he wants to come in for appt with TP tomorrow.  Pt placed on schedule with TP 7/31 at 11a  Will send message to TP nurse JJ as FYI.

## 2012-08-25 NOTE — Telephone Encounter (Signed)
[name] [name] called again to check the status of pt's nebs [name] [name] that b/c MW was not the prescriber for the perforomist, we have no documentation of this medication and has an appt scheduled tomorrow Will sign and forward back to my inbasket to be taken care of tomorrow >> per [name] [name], the ov note needs the appropriate documentation w/ rx's for the pulmicort 0.5 and perforomist and a rescue neb if needed.

## 2012-08-26 ENCOUNTER — Ambulatory Visit (INDEPENDENT_AMBULATORY_CARE_PROVIDER_SITE_OTHER): Payer: Medicare Other | Admitting: Adult Health

## 2012-08-26 ENCOUNTER — Encounter: Payer: Self-pay | Admitting: Adult Health

## 2012-08-26 VITALS — BP 132/60 | HR 53 | Temp 97.0°F | Ht 72.0 in | Wt 187.0 lb

## 2012-08-26 DIAGNOSIS — J4540 Moderate persistent asthma, uncomplicated: Secondary | ICD-10-CM

## 2012-08-26 DIAGNOSIS — J45909 Unspecified asthma, uncomplicated: Secondary | ICD-10-CM

## 2012-08-26 MED ORDER — ALBUTEROL SULFATE (5 MG/ML) 0.5% IN NEBU: 2.5000 mg | INHALATION_SOLUTION | RESPIRATORY_TRACT | Status: DC | PRN

## 2012-08-26 NOTE — Progress Notes (Signed)
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  HPI 10/01/2011 1st pulmonary cc doe indolent onset slowly progressive doe x one year x 50 ft consistently so uses [facility] parking and rides cart at [facility].  Assoc with hoarseness and variable sense of wheezing but  No unusual cough, assoc cp, purulent sputum or sinus/hb symptoms on present rx. No better with neb or spiriva, if anything worse breathing worse p rx  spiriva and also having urinary hesitance.  Limited by knees about same time as breath, confirmed during ov s desat. rec  Labs c/w mild cri, anemia, nl tsh and bnp GERD diet        10/31/2011 f/u ov/Wert cc sob no better, but still knees stop him from being sob with activity, does have episodes where fm thinks he's wheezing, ? Better with neb, pt not sure about either. 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  No obvious daytime variabilty or assoc chronic cough or cp or chest tightness, subjective wheeze overt sinus or hb  symptoms. No unusual exp hx or h/o childhood pna/ asthma or premature birth to his knowledge.  >> symbicort cont   08/26/2012 Follow up  Returns for follow up and orders for neb meds.  He was admitted earlier this year for UTI, and Asthma / COPD exacerbation. He was changed from Inhalers to [facility] -felt he would be able to use this better. Has stopped protonix and pepcid . As he does get reflux on occasion.  DME -[facility] not covering his nebs any longer , needs them to be changed to new company.  No chest pain , orthopnea or increased edema. Accompanied by his daughter today.      ROS  The following are not active complaints unless bolded sore throat, dysphagia, dental problems, itching, sneezing,  nasal congestion or excess/ purulent secretions, ear ache,   fever, chills, sweats, unintended wt loss, pleuritic or exertional cp, hemoptysis,  orthopnea pnd or leg swelling, presyncope, palpitations, heartburn, abdominal pain, anorexia, nausea, vomiting, diarrhea  or change in bowel or urinary habits, change in stools or urine, dysuria,hematuria,  rash, arthralgias, visual complaints, headache, numbness weakness or ataxia or problems with walking or coordination,  change in mood/affect or memory.            Objective:   Physical Exam  Mildly hoarse amb  wm no longer pseudowheezes  Wt 192 10/31/2011 > 190 12/08/11 > 12/23/2011  187 >187 08/26/2012   HEENT mild turbinate edema.  Oropharynx no thrush or excess pnd or cobblestoning.  No JVD or cervical adenopathy. Mild accessory muscle hypertrophy. Trachea midline, nl thryroid. Chest was hyperinflated by percussion with diminished breath sounds and moderate increased exp time without wheeze. Hoover sign positive at mid inspiration. Regular rate and rhythm without murmur gallop or rub or increase P2 or edema.  Abd: no hsm, nl excursion. Ext warm without cyanosis or clubbing.      03/15/12 Lower lung volumes and mildly rotated to the right. No acute   cardiopulmonary abnormality.           Assessment & Plan:

## 2012-08-26 NOTE — Patient Instructions (Addendum)
Continue on Brovana and Budesonide Neb Twice daily  , rinse after use.  We will send Neb prescription to [facility].  May use Albuterol Neb every 4 hrs As needed  Wheezing/shortness of breath.  Begin Prilosec 20mg  daily before meal in am.  Begin Pepcid 20mg  At bedtime  .  Follow up Dr. Sherene Sires  In 2 months

## 2012-08-26 NOTE — Assessment & Plan Note (Signed)
Continue on Brovana and Budesonide Neb Twice daily  , rinse after use.  We will send Neb prescription to [facility] company.  May use Albuterol Neb every 4 hrs As needed  Wheezing/shortness of breath.  Begin Prilosec 20mg daily before meal in am.  Begin Pepcid 20mg At bedtime  .  Follow up Dr. Wert  In 2 months  

## 2012-08-27 ENCOUNTER — Telehealth: Payer: Self-pay | Admitting: Internal Medicine

## 2012-08-27 NOTE — Telephone Encounter (Signed)
Called [facility], spoke with Wynona Neat that pt was changed to a different pharmacy at the 7.31.14 ov (changed to [facility]) Nothing further needed; will sign off.

## 2012-08-31 ENCOUNTER — Ambulatory Visit: Payer: Medicare Other | Admitting: Internal Medicine

## 2012-08-31 ENCOUNTER — Telehealth: Payer: Self-pay | Admitting: Internal Medicine

## 2012-08-31 DIAGNOSIS — J45909 Unspecified asthma, uncomplicated: Secondary | ICD-10-CM

## 2012-08-31 MED ORDER — BUDESONIDE 0.5 MG/2ML IN SUSP: 0.5000 mg | Freq: Two times a day (BID) | RESPIRATORY_TRACT | Status: DC

## 2012-08-31 MED ORDER — ARFORMOTEROL TARTRATE 15 MCG/2ML IN NEBU: 15.0000 ug | INHALATION_SOLUTION | Freq: Two times a day (BID) | RESPIRATORY_TRACT | Status: DC

## 2012-08-31 NOTE — Telephone Encounter (Signed)
Spoke with [employer].  States pt has never received the brovana or budesonide rxs from [facility] as advised they would during last OV.   Does not look like order was placed or rxs were printed during this OV. I have placed order and printed both rxs for TP's signature.   Appologized to [facility] regarding this delay.   Advised it would be followed up first thing in am. As PCCs have left for the night, will hold msg and follow up with [facility], PCCs, and [name] in am

## 2012-09-01 NOTE — Telephone Encounter (Signed)
Order and rxs for Brovana and budesonide were faxed this am by Bjorn Loser to [facility]. Called [facility], spoke with Larita Fife.  They are receiving fax now.  Per Larita Fife, medications will be overnighted to pt though UPS and no signature is needed for this when delivered.  Larita Fife will contact [place] with any question.  I have provided her with [name]'s number.  Called, spoke with Judeth Cornfield -  I explained above to her.  She verbalized understanding and was very appreciative of this help.

## 2012-09-20 ENCOUNTER — Other Ambulatory Visit: Payer: Self-pay | Admitting: Cardiovascular Disease

## 2012-09-23 DIAGNOSIS — I10 Essential (primary) hypertension: Secondary | ICD-10-CM | POA: Diagnosis not present

## 2012-09-23 DIAGNOSIS — E782 Mixed hyperlipidemia: Secondary | ICD-10-CM | POA: Diagnosis not present

## 2012-09-23 DIAGNOSIS — J449 Chronic obstructive pulmonary disease, unspecified: Secondary | ICD-10-CM | POA: Diagnosis not present

## 2012-10-21 ENCOUNTER — Encounter: Payer: Self-pay | Admitting: Cardiovascular Disease

## 2012-10-21 ENCOUNTER — Ambulatory Visit (INDEPENDENT_AMBULATORY_CARE_PROVIDER_SITE_OTHER): Payer: Medicare Other | Admitting: Cardiovascular Disease

## 2012-10-21 VITALS — BP 140/50 | HR 84 | Ht 72.0 in | Wt 183.0 lb

## 2012-10-21 DIAGNOSIS — K5730 Diverticulosis of large intestine without perforation or abscess without bleeding: Secondary | ICD-10-CM | POA: Diagnosis not present

## 2012-10-21 DIAGNOSIS — I2589 Other forms of chronic ischemic heart disease: Secondary | ICD-10-CM

## 2012-10-21 DIAGNOSIS — I469 Cardiac arrest, cause unspecified: Secondary | ICD-10-CM | POA: Diagnosis not present

## 2012-10-21 DIAGNOSIS — I255 Ischemic cardiomyopathy: Secondary | ICD-10-CM

## 2012-10-21 MED ORDER — DSS 100 MG PO CAPS: 100.0000 mg | ORAL_CAPSULE | Freq: Every day | ORAL | Status: DC | PRN

## 2012-10-21 NOTE — Assessment & Plan Note (Signed)
Euvolemic  Dyspnea form Gold 2-3 COPD  Seeing pulmonary Needs flu shot  Continue inhalers

## 2012-10-21 NOTE — Progress Notes (Signed)
Patient ID: [name] [name], male   DOB: September 07, 1925, 79 y.o.   MRN: [id number] [name] [name] is seen today after his AICD implant. He is a sudden death survivor with 3 stents. EF by MRI is 31% with a large scar. 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 He still has good quality of life and family support. He has returned to work. He has stopped smoking since his arrest . He denies SSCP, , or syncope.l Device has not fired Wound healed well. Compliant with meds and no SSCP. His grandaughter plays softball with my daughter and I get to see [name] [name] at the ballfield. He looks good Recent exacerbation of breathing. CXR normal no CHF BNP normal and improved after seeing Dr [name] [name] and adjusting inhalers. Also has thalesemia. Getting iv iron Hb around 10 Does not want to have bone marrow biopsy  05/03/09 Stent to Ramus, circ and RCA Given number of stents will stay on Plavix indefinately  Recent hospitalization for UTI had some elevation in Cr and finished antibiotics Echo during hospitalization EF 55% Sees Dr [name] [name] and still needs I/O cath at times  Seeing Dr [name] [name] next week Some difficulty getting his inhalers.    ROS: Denies fever, malais, weight loss, blurry vision, decreased visual acuity, cough, sputum, SOB, hemoptysis, pleuritic pain, palpitaitons, heartburn, abdominal pain, melena, lower extremity edema, claudication, or rash.  All other systems reviewed and negative  General: Affect appropriate Frail elderly mail  HEENT: normal Neck supple with no adenopathy JVP normal no bruits no thyromegaly Lungs wheezing but  good diaphragmatic motion Heart:  S1/S2 no murmur, no rub, gallop or click PMI normal Abdomen: benighn, BS positve, no tenderness, no AAA no bruit.  No HSM or HJR Distal pulses intact with no bruits No edema Neuro non-focal Skin warm and dry No muscular weakness   Current Outpatient Prescriptions  Medication Sig Dispense Refill  . albuterol (PROVENTIL) (5  MG/ML) 0.5% nebulizer solution Take 0.5 mLs (2.5 mg total) by nebulization every 4 (four) hours as needed for wheezing. And q3 hrs prn for SOB  20 mL  3  . ALPRAZolam (XANAX) 0.5 MG tablet Take 1 tablet (0.5 mg total) by mouth at bedtime.  30 tablet  3  . arformoterol (BROVANA) 15 MCG/2ML NEBU Take 2 mLs (15 mcg total) by nebulization 2 (two) times daily.  120 mL  3  . aspirin EC 81 MG tablet Take 81 mg by mouth daily.      . budesonide (PULMICORT) 0.5 MG/2ML nebulizer solution Take 2 mLs (0.5 mg total) by nebulization 2 (two) times daily.  120 mL  3  . clopidogrel (PLAVIX) 75 MG tablet TAKE 1 TABLET ONCE DAILY.  30 tablet  4  . Docusate Sodium (DSS) 100 MG CAPS Take 100 mg by mouth daily as needed.      . famotidine (PEPCID) 20 MG tablet Take 1 tablet by mouth at bedtime.      . formoterol (PERFOROMIST) 20 MCG/2ML nebulizer solution Take 2 mLs (20 mcg total) by nebulization 2 (two) times daily.  60 mL  6  . losartan (COZAAR) 25 MG tablet Take 1 tablet by mouth daily.      . metoprolol tartrate (LOPRESSOR) 25 MG tablet 1/2 tab by mouth twice daily      . rosuvastatin (CRESTOR) 5 MG tablet Take 1 tablet (5 mg total) by mouth daily.  30 tablet  12  . nitroGLYCERIN (NITROSTAT) 0.4 MG SL tablet Place 1 tablet (0.4 mg total)  under the tongue every 5 (five) minutes as needed for chest pain.  25 tablet  4   No current facility-administered medications for this visit.    Allergies  Review of patient's allergies indicates no known allergies.  Electrocardiogram: 3/14  SR rate 66 PAC   Assessment and Plan

## 2012-10-21 NOTE — Assessment & Plan Note (Signed)
Normal defib funciton Does not check at home by phone  F/U EP clinic

## 2012-10-21 NOTE — Assessment & Plan Note (Signed)
Discussed low carb diet.  Target hemoglobin A1c is 6.5 or less.  Continue current medications.  

## 2012-10-21 NOTE — Patient Instructions (Addendum)
Your physician wants you to follow-up in:  6 MONTHS WITH DR NISHAN  You will receive a reminder letter in the mail two months in advance. If you don't receive a letter, please call our office to schedule the follow-up appointment. Your physician recommends that you continue on your current medications as directed. Please refer to the Current Medication list given to you today. 

## 2012-10-26 ENCOUNTER — Ambulatory Visit (INDEPENDENT_AMBULATORY_CARE_PROVIDER_SITE_OTHER): Payer: Medicare Other | Admitting: Internal Medicine

## 2012-10-26 ENCOUNTER — Encounter: Payer: Self-pay | Admitting: Internal Medicine

## 2012-10-26 VITALS — BP 112/74 | HR 61 | Temp 97.0°F | Ht 72.0 in | Wt 182.8 lb

## 2012-10-26 DIAGNOSIS — J45909 Unspecified asthma, uncomplicated: Secondary | ICD-10-CM

## 2012-10-26 DIAGNOSIS — R1310 Dysphagia, unspecified: Secondary | ICD-10-CM

## 2012-10-26 NOTE — Progress Notes (Signed)
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  HPI 10/01/2011 1st pulmonary cc doe indolent onset slowly progressive doe x one year x 50 ft consistently so uses [facility] parking and rides cart at [place].  Assoc with hoarseness and variable sense of wheezing but  No unusual cough, assoc cp, purulent sputum or sinus/hb symptoms on present rx. No better with neb or spiriva, if anything worse breathing worse p rx  spiriva and also having urinary hesitance.  Limited by knees about same time as breath, confirmed during ov s desat. rec  Labs c/w mild cri, anemia, nl tsh and bnp GERD diet        10/31/2011 f/u ov/[name] [name] cc sob no better, but still knees stop him from being sob with activity, does have episodes where fm thinks he's wheezing, ? Better with neb, pt not sure about either. 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  No obvious daytime variabilty or assoc chronic cough or cp or chest tightness, subjective wheeze overt sinus or hb  symptoms. No unusual exp hx or h/o childhood pna/ asthma or premature birth to his knowledge.  >> symbicort cont    03/16/12 Swallowing study:  Severe dysmotility.  Persistent focal smooth narrowing within the distal esophagus  concerning for stricture.  08/26/2012 Follow up  Returns for follow up and orders for neb meds.  He was admitted earlier this year for UTI, and Asthma / COPD exacerbation. He was changed from Inhalers to [facility] -felt he would be able to use this better. Has stopped protonix and pepcid . As he does get reflux on occasion.  DME -[facility] not covering his nebs any longer , needs them to be changed to new company.  No chest pain , orthopnea or increased edema. Accompanied by his daughter today.  rec Continue on Brovana and Budesonide Neb Twice daily  , rinse after use.  We will send Neb prescription to [facility].  May use Albuterol Neb every 4 hrs As needed  Wheezing/shortness of breath.  Begin Prilosec 20mg  daily before meal in am.  Begin Pepcid 20mg  At bedtime  .    10/26/2012 f/u ov/[name] [name] re:  Walking is limited knees / wear 02 at hs  Chief Complaint  Patient presents with  . Follow-up    Pt states no changes  in his breathing since last visit. He states that he has noticed having leg cramps at night since started on nebs.    No need for albuterol, no  Sign cough. Swallowing ok   No obvious day to day or daytime variabilty or assoc chronic cough or cp or chest tightness, subjective wheeze overt sinus or hb symptoms. No unusual exp hx or h/o childhood pna/ asthma or knowledge of premature birth.  Sleeping ok without nocturnal  or early am exacerbation  of respiratory  c/o's or need for noct saba. Also denies any obvious fluctuation of symptoms with weather or environmental changes or other aggravating or alleviating factors except as outlined above   Current Medications, Allergies, Complete Past Medical History, Past Surgical History, Family History, and Social History  were reviewed in [employer] record.       ROS  The following are not active complaints unless bolded sore throat, dysphagia, dental problems, itching, sneezing,  nasal congestion or excess/ purulent secretions, ear ache,   fever, chills, sweats, unintended wt loss, pleuritic or exertional cp, hemoptysis,  orthopnea pnd or leg swelling, presyncope, palpitations, heartburn, abdominal pain, anorexia, nausea, vomiting, diarrhea  or change in bowel or urinary habits, change in stools or urine, dysuria,hematuria,  rash, arthralgias, visual complaints, headache, numbness weakness or ataxia or problems with walking or coordination,  change in mood/affect or memory.            Objective:   Physical Exam  Mildly hoarse amb  wm no longer pseudowheezes  Wt 192 10/31/2011 > 190 12/08/11 > 12/23/2011  187 >187 08/26/2012 > 10/26/2012 183   HEENT mild turbinate edema.  Oropharynx no thrush or excess pnd or cobblestoning.  No JVD or cervical adenopathy. Mild accessory muscle hypertrophy. Trachea midline, nl thryroid. Chest was hyperinflated by percussion with diminished breath sounds and moderate increased exp time without wheeze. Hoover sign positive at mid inspiration. Regular rate and rhythm without murmur gallop or rub or increase P2 or edema.  Abd: no hsm, nl excursion. Ext warm without cyanosis or clubbing.      03/15/12 Lower lung volumes and mildly rotated to the right. No acute  cardiopulmonary abnormality.           Assessment & Plan:

## 2012-10-26 NOTE — Patient Instructions (Addendum)
Try to leave off the pm treatments, but if breathing worse add back just the budesonide in pm  Only use your albuterol as a rescue medication to be used if you can't catch your breath by resting or doing a relaxed purse lip breathing pattern.  - The less you use it, the better it will work when you need it. - Ok to use up to every 4 hours if you must but call for immediate appointment if use goes up over your usual need - Don't leave home without it !!  (think of it like your spare tire for your car)   Please schedule a follow up visit in 6 months but call sooner if needed

## 2012-10-27 DIAGNOSIS — R1310 Dysphagia, unspecified: Secondary | ICD-10-CM | POA: Insufficient documentation

## 2012-10-27 NOTE — Assessment & Plan Note (Addendum)
-   PFT's 04/16/11 FEV1  1.57 (60%) ratio 71 and 12% better p B2, dlco 41 corrects to 58    - 10/01/2011   Walked RA x one lap @ 185 stopped due to  Knees hurt, no sob or desat    - HFA 50% p coaching 12/23/2011  > neb dependent    - note abn swallowing study 03/16/12  Adequate control on present rx, reviewed > no change in rx needed  Though probably doesn't need the pm laba given then noct cramping so try off.      Each maintenance medication was reviewed in detail including most importantly the difference between maintenance and as needed and under what circumstances the prns are to be used.  Please see instructions for details which were reviewed in writing and the patient given a copy.

## 2012-10-27 NOTE — Assessment & Plan Note (Signed)
Ba Swallow 03/16/12 Severe dysmotility.  Persistent focal smooth narrowing within the distal esophagus  concerning for stricture.  At risk for asp/ obstruction >   rec max (24h)  acid suppression and diet restrictions/ reviewed and instructions given in writting

## 2012-11-03 ENCOUNTER — Ambulatory Visit (INDEPENDENT_AMBULATORY_CARE_PROVIDER_SITE_OTHER): Payer: Medicare Other | Admitting: *Deleted

## 2012-11-03 DIAGNOSIS — I469 Cardiac arrest, cause unspecified: Secondary | ICD-10-CM

## 2012-11-03 DIAGNOSIS — I509 Heart failure, unspecified: Secondary | ICD-10-CM

## 2012-11-03 NOTE — Progress Notes (Signed)
In office ICD interrogation. Normal device function. No changes made this session. 

## 2012-11-18 DIAGNOSIS — Z23 Encounter for immunization: Secondary | ICD-10-CM | POA: Diagnosis not present

## 2012-12-07 ENCOUNTER — Encounter: Payer: Self-pay | Admitting: Internal Medicine

## 2012-12-14 ENCOUNTER — Emergency Department (HOSPITAL_COMMUNITY)
Admission: EM | Admit: 2012-12-14 | Discharge: 2012-12-15 | Disposition: A | Discharge status: Home / Self Care | Payer: Medicare Other | Attending: Emergency Medicine | Admitting: Emergency Medicine

## 2012-12-14 DIAGNOSIS — R319 Hematuria, unspecified: Secondary | ICD-10-CM | POA: Insufficient documentation

## 2012-12-14 DIAGNOSIS — I252 Old myocardial infarction: Secondary | ICD-10-CM | POA: Diagnosis not present

## 2012-12-14 DIAGNOSIS — Z8719 Personal history of other diseases of the digestive system: Secondary | ICD-10-CM | POA: Insufficient documentation

## 2012-12-14 DIAGNOSIS — Z87891 Personal history of nicotine dependence: Secondary | ICD-10-CM | POA: Insufficient documentation

## 2012-12-14 DIAGNOSIS — I509 Heart failure, unspecified: Secondary | ICD-10-CM | POA: Insufficient documentation

## 2012-12-14 DIAGNOSIS — D649 Anemia, unspecified: Secondary | ICD-10-CM | POA: Diagnosis not present

## 2012-12-14 DIAGNOSIS — Z87448 Personal history of other diseases of urinary system: Secondary | ICD-10-CM | POA: Insufficient documentation

## 2012-12-14 DIAGNOSIS — Z95810 Presence of automatic (implantable) cardiac defibrillator: Secondary | ICD-10-CM | POA: Insufficient documentation

## 2012-12-14 DIAGNOSIS — IMO0002 Reserved for concepts with insufficient information to code with codable children: Secondary | ICD-10-CM | POA: Insufficient documentation

## 2012-12-14 DIAGNOSIS — N390 Urinary tract infection, site not specified: Secondary | ICD-10-CM | POA: Diagnosis not present

## 2012-12-14 DIAGNOSIS — Z7902 Long term (current) use of antithrombotics/antiplatelets: Secondary | ICD-10-CM | POA: Insufficient documentation

## 2012-12-14 DIAGNOSIS — E785 Hyperlipidemia, unspecified: Secondary | ICD-10-CM | POA: Diagnosis not present

## 2012-12-14 DIAGNOSIS — E119 Type 2 diabetes mellitus without complications: Secondary | ICD-10-CM | POA: Diagnosis not present

## 2012-12-14 DIAGNOSIS — Z862 Personal history of diseases of the blood and blood-forming organs and certain disorders involving the immune mechanism: Secondary | ICD-10-CM | POA: Diagnosis not present

## 2012-12-14 DIAGNOSIS — Z7982 Long term (current) use of aspirin: Secondary | ICD-10-CM | POA: Insufficient documentation

## 2012-12-14 DIAGNOSIS — R6883 Chills (without fever): Secondary | ICD-10-CM | POA: Insufficient documentation

## 2012-12-14 DIAGNOSIS — J438 Other emphysema: Secondary | ICD-10-CM | POA: Insufficient documentation

## 2012-12-14 DIAGNOSIS — Z9981 Dependence on supplemental oxygen: Secondary | ICD-10-CM | POA: Diagnosis not present

## 2012-12-14 DIAGNOSIS — I2510 Atherosclerotic heart disease of native coronary artery without angina pectoris: Secondary | ICD-10-CM | POA: Diagnosis not present

## 2012-12-14 DIAGNOSIS — Z8669 Personal history of other diseases of the nervous system and sense organs: Secondary | ICD-10-CM | POA: Insufficient documentation

## 2012-12-14 DIAGNOSIS — Z79899 Other long term (current) drug therapy: Secondary | ICD-10-CM | POA: Insufficient documentation

## 2012-12-14 NOTE — ED Notes (Addendum)
Pt reports he began having hematuria at 1800 tonight, pt self caths, last time this morning, then noted blood without pain.  Pt able to urinate while waiting for triage. Reports R flank pain in the morning, but denies any other GU complaints. Pt urologist is Dr. Patsi Sears

## 2012-12-15 ENCOUNTER — Encounter (HOSPITAL_COMMUNITY): Payer: Self-pay | Admitting: Emergency Medicine

## 2012-12-15 LAB — URINE MICROSCOPIC-ADD ON

## 2012-12-15 LAB — CBC WITH DIFFERENTIAL/PLATELET
Basophils Absolute: 0 10*3/uL (ref 0.0–0.1)
Basophils Relative: 0 % (ref 0–1)
Eosinophils Absolute: 0.1 10*3/uL (ref 0.0–0.7)
HCT: 30.8 % — ABNORMAL LOW (ref 39.0–52.0)
Hemoglobin: 9.9 g/dL — ABNORMAL LOW (ref 13.0–17.0)
Lymphocytes Relative: 15 % (ref 12–46)
Monocytes Relative: 3 % (ref 3–12)
Neutro Abs: 6 10*3/uL (ref 1.7–7.7)
Neutrophils Relative %: 80 % — ABNORMAL HIGH (ref 43–77)
WBC: 7.4 10*3/uL (ref 4.0–10.5)

## 2012-12-15 LAB — COMPREHENSIVE METABOLIC PANEL
ALT: 11 U/L (ref 0–53)
AST: 13 U/L (ref 0–37)
Albumin: 3.8 g/dL (ref 3.5–5.2)
CO2: 26 mEq/L (ref 19–32)
Calcium: 8.8 mg/dL (ref 8.4–10.5)
Chloride: 103 mEq/L (ref 96–112)
Creatinine, Ser: 1.29 mg/dL (ref 0.50–1.35)
GFR calc non Af Amer: 49 mL/min — ABNORMAL LOW (ref >90)
Sodium: 139 mEq/L (ref 135–145)

## 2012-12-15 LAB — URINALYSIS, ROUTINE W REFLEX MICROSCOPIC
Glucose, UA: NEGATIVE mg/dL
Ketones, ur: 15 mg/dL — AB
Specific Gravity, Urine: 1.032 — ABNORMAL HIGH (ref 1.005–1.030)
pH: 5.5 (ref 5.0–8.0)

## 2012-12-15 LAB — PROTIME-INR: INR: 1.05 (ref 0.00–1.49)

## 2012-12-15 LAB — APTT: aPTT: 28 seconds (ref 24–37)

## 2012-12-15 MED ORDER — CEPHALEXIN 500 MG PO CAPS: 500.0000 mg | ORAL_CAPSULE | Freq: Four times a day (QID) | ORAL | Status: DC

## 2012-12-15 MED ORDER — DEXTROSE 5 % IV SOLN
1.0000 g | Freq: Once | INTRAVENOUS | Status: AC
  Administered 2012-12-15: 1 g via INTRAVENOUS
  Filled 2012-12-15: qty 10

## 2012-12-15 NOTE — ED Notes (Signed)
IV attempted times one, pt didn't tolerate well

## 2012-12-15 NOTE — ED Provider Notes (Signed)
CSN: [id number]     Arrival date & time 12/14/12  2348 History   First MD Initiated Contact with Patient 12/15/12 0035     Chief Complaint  Patient presents with  . Hematuria   (Consider location/radiation/quality/duration/timing/severity/associated sxs/prior Treatment) HPI Vision intermittently self caths for urinary retention due to prostatic hypertrophy. He is a patient of Dr. Imelda Pillow. He reports that tonight he has had increasing frequency and noticed red urine. He is admitts to passing several clots. He is able to urinate while in the emergency department. He denies any fevers or chills. He denies any flank pain. He denies any abdominal pain, nausea or vomiting. Past Medical History  Diagnosis Date  . CAD (coronary artery disease)   . Ischemic cardiomyopathy   . CHF (congestive heart failure)   . Dyslipidemia   . Diverticulosis of colon (without mention of hemorrhage)   . Diabetes mellitus   . Anemia, unspecified   . Unspecified disorder resulting from impaired renal function   . Chronic airway obstruction, not elsewhere classified     on o2 at night  . Prostatic hypertrophy     hx of  . Retention of urine, unspecified   . ICD (implantable cardiac defibrillator) in place     st jude  . Emphysema   . Hemorrhoids   . MI, old 2010 or 2011  . Macular degeneration   . Cataracts, bilateral    Past Surgical History  Procedure Laterality Date  . Pci      4/11  . Cardiac defibrillator placement  2011    st jude  . Pacemaker insertion  2011  . Tonsillectomy  79 yo  . Cataract extraction     Family History  Problem Relation Age of Onset  . Lung cancer Father     smoker  . Coronary artery disease Mother   . Stroke Mother   . Breast cancer Sister   . Atrial fibrillation Daughter   . Congestive Heart Failure Mother    History  Substance Use Topics  . Smoking status: Former Smoker    Types: Cigarettes    Quit date: 04/27/2009  . Smokeless tobacco: Never Used  .  Alcohol Use: Yes     Comment: occasional beer    Review of Systems  Constitutional: Positive for chills. Negative for fever.  Respiratory: Negative for shortness of breath.   Cardiovascular: Negative for chest pain.  Gastrointestinal: Negative for nausea, vomiting, abdominal pain and diarrhea.  Genitourinary: Positive for frequency and hematuria. Negative for dysuria and flank pain.  Musculoskeletal: Negative for back pain, myalgias, neck pain and neck stiffness.  Skin: Negative for pallor, rash and wound.  Neurological: Negative for dizziness, syncope, weakness, light-headedness, numbness and headaches.  All other systems reviewed and are negative.    Allergies  Review of patient's allergies indicates no known allergies.  Home Medications   Current Outpatient Rx  Name  Route  Sig  Dispense  Refill  . albuterol (PROVENTIL) (5 MG/ML) 0.5% nebulizer solution   Nebulization   Take 0.5 mLs (2.5 mg total) by nebulization every 4 (four) hours as needed for wheezing. And q3 hrs prn for SOB   20 mL   3   . ALPRAZolam (XANAX) 0.5 MG tablet   Oral   Take 0.5 mg by mouth at bedtime as needed. For sleep         . arformoterol (BROVANA) 15 MCG/2ML NEBU   Nebulization   Take 2 mLs (15 mcg total) by nebulization 2 (  two) times daily.   120 mL   3   . aspirin EC 81 MG tablet   Oral   Take 81 mg by mouth daily.         . budesonide (PULMICORT) 0.5 MG/2ML nebulizer solution   Nebulization   Take 2 mLs (0.5 mg total) by nebulization 2 (two) times daily.   120 mL   3   . clopidogrel (PLAVIX) 75 MG tablet      TAKE 1 TABLET ONCE DAILY.   30 tablet   4   . docusate sodium (COLACE) 100 MG capsule   Oral   Take 100 mg by mouth daily as needed for mild constipation.         . famotidine (PEPCID) 20 MG tablet   Oral   Take 1 tablet by mouth at bedtime.         . formoterol (PERFOROMIST) 20 MCG/2ML nebulizer solution   Nebulization   Take 2 mLs (20 mcg total) by  nebulization 2 (two) times daily.   60 mL   6   . losartan (COZAAR) 25 MG tablet   Oral   Take 1 tablet by mouth daily.         . metoprolol tartrate (LOPRESSOR) 25 MG tablet   Oral   Take 12.5 mg by mouth 2 (two) times daily. 1/2 tab by mouth twice daily         . rosuvastatin (CRESTOR) 5 MG tablet   Oral   Take 1 tablet (5 mg total) by mouth daily.   30 tablet   12   . EXPIRED: nitroGLYCERIN (NITROSTAT) 0.4 MG SL tablet   Sublingual   Place 1 tablet (0.4 mg total) under the tongue every 5 (five) minutes as needed for chest pain.   25 tablet   4    BP 160/69  Pulse 65  Temp(Src) 98.1 F (36.7 C) (Oral)  Resp 18  SpO2 93% Physical Exam  Nursing note and vitals reviewed. Constitutional: He is oriented to person, place, and time. He appears well-developed and well-nourished. No distress.  HENT:  Head: Normocephalic and atraumatic.  Mouth/Throat: Oropharynx is clear and moist.  Eyes: EOM are normal. Pupils are equal, round, and reactive to light.  Neck: Normal range of motion. Neck supple.  Cardiovascular: Normal rate and regular rhythm.   Pulmonary/Chest: Effort normal and breath sounds normal. No respiratory distress. He has no wheezes. He has no rales.  Abdominal: Soft. Bowel sounds are normal. He exhibits no distension and no mass. There is no tenderness. There is no rebound and no guarding.  Musculoskeletal: Normal range of motion. He exhibits no edema and no tenderness.  No CVA tenderness  Neurological: He is alert and oriented to person, place, and time.  Skin: Skin is warm and dry. No rash noted. No erythema.  Psychiatric: He has a normal mood and affect. His behavior is normal.    ED Course  Procedures (including critical care time) Labs Review Labs Reviewed  URINALYSIS, ROUTINE W REFLEX MICROSCOPIC - Abnormal; Notable for the following:    Color, Urine RED (*)    APPearance TURBID (*)    Specific Gravity, Urine 1.032 (*)    Hgb urine dipstick LARGE  (*)    Bilirubin Urine LARGE (*)    Ketones, ur 15 (*)    Protein, ur >300 (*)    Nitrite POSITIVE (*)    Leukocytes, UA MODERATE (*)    All other components within normal limits  GLUCOSE, CAPILLARY  URINE MICROSCOPIC-ADD ON  CBC WITH DIFFERENTIAL  COMPREHENSIVE METABOLIC PANEL  PROTIME-INR  APTT   Imaging Review No results found.  EKG Interpretation   None       MDM   Patient continues to be well-appearing in emergency department. Hemoglobin is stable. He been given 1 g of Rocephin IV. Been advised to followup with his urologist tomorrow and return precautions have been given. Patient and his family voice understanding.  Loren Racer, MD 12/15/12 [phone]

## 2012-12-17 DIAGNOSIS — R310 Gross hematuria: Secondary | ICD-10-CM | POA: Diagnosis not present

## 2013-02-04 ENCOUNTER — Encounter: Payer: Medicare Other | Admitting: *Deleted

## 2013-02-07 DIAGNOSIS — E782 Mixed hyperlipidemia: Secondary | ICD-10-CM | POA: Diagnosis not present

## 2013-02-07 DIAGNOSIS — N400 Enlarged prostate without lower urinary tract symptoms: Secondary | ICD-10-CM | POA: Diagnosis not present

## 2013-02-07 DIAGNOSIS — Z1331 Encounter for screening for depression: Secondary | ICD-10-CM | POA: Diagnosis not present

## 2013-02-07 DIAGNOSIS — J449 Chronic obstructive pulmonary disease, unspecified: Secondary | ICD-10-CM | POA: Diagnosis not present

## 2013-02-07 DIAGNOSIS — N183 Chronic kidney disease, stage 3 unspecified: Secondary | ICD-10-CM | POA: Diagnosis not present

## 2013-02-07 DIAGNOSIS — I10 Essential (primary) hypertension: Secondary | ICD-10-CM | POA: Diagnosis not present

## 2013-02-07 DIAGNOSIS — E119 Type 2 diabetes mellitus without complications: Secondary | ICD-10-CM | POA: Diagnosis not present

## 2013-02-08 ENCOUNTER — Other Ambulatory Visit: Payer: Self-pay | Admitting: Cardiovascular Disease

## 2013-02-09 ENCOUNTER — Encounter: Payer: Self-pay | Admitting: *Deleted

## 2013-02-16 ENCOUNTER — Encounter: Payer: Medicare Other | Admitting: *Deleted

## 2013-02-16 DIAGNOSIS — I509 Heart failure, unspecified: Secondary | ICD-10-CM

## 2013-02-16 DIAGNOSIS — I255 Ischemic cardiomyopathy: Secondary | ICD-10-CM

## 2013-02-16 DIAGNOSIS — Z95810 Presence of automatic (implantable) cardiac defibrillator: Secondary | ICD-10-CM

## 2013-02-16 LAB — MDC_IDC_ENUM_SESS_TYPE_REMOTE
Battery Remaining Percentage: 70 %
Battery Voltage: 2.98 V
Brady Statistic AP VP Percent: 1 %
Brady Statistic RA Percent Paced: 1 %
Date Time Interrogation Session: 20150118094408
HIGH POWER IMPEDANCE MEASURED VALUE: 69 Ohm
HighPow Impedance: 69 Ohm
Lead Channel Impedance Value: 400 Ohm
Lead Channel Pacing Threshold Amplitude: 1.25 V
Lead Channel Pacing Threshold Pulse Width: 0.5 ms
Lead Channel Sensing Intrinsic Amplitude: 10.4 mV
Lead Channel Setting Pacing Amplitude: 2.5 V
MDC IDC MSMT BATTERY REMAINING LONGEVITY: 80 mo
MDC IDC MSMT LEADCHNL RA IMPEDANCE VALUE: 430 Ohm
MDC IDC MSMT LEADCHNL RA PACING THRESHOLD AMPLITUDE: 0.75 V
MDC IDC MSMT LEADCHNL RA PACING THRESHOLD PULSEWIDTH: 0.5 ms
MDC IDC MSMT LEADCHNL RA SENSING INTR AMPL: 4.2 mV
MDC IDC PG SERIAL: 623396
MDC IDC SET LEADCHNL RA PACING AMPLITUDE: 2 V
MDC IDC SET LEADCHNL RV PACING PULSEWIDTH: 0.5 ms
MDC IDC SET LEADCHNL RV SENSING SENSITIVITY: 0.5 mV
MDC IDC STAT BRADY AP VS PERCENT: 1 %
MDC IDC STAT BRADY AS VP PERCENT: 1 %
MDC IDC STAT BRADY AS VS PERCENT: 99 %
MDC IDC STAT BRADY RV PERCENT PACED: 1 %
Zone Setting Detection Interval: 270 ms
Zone Setting Detection Interval: 335 ms

## 2013-02-22 ENCOUNTER — Encounter: Payer: Self-pay | Admitting: *Deleted

## 2013-02-25 ENCOUNTER — Encounter: Payer: Self-pay | Admitting: Internal Medicine

## 2013-03-02 DIAGNOSIS — H35059 Retinal neovascularization, unspecified, unspecified eye: Secondary | ICD-10-CM | POA: Diagnosis not present

## 2013-03-02 DIAGNOSIS — H353290 Exudative age-related macular degeneration, unspecified eye, stage unspecified: Secondary | ICD-10-CM | POA: Diagnosis not present

## 2013-03-10 DIAGNOSIS — H353190 Nonexudative age-related macular degeneration, unspecified eye, stage unspecified: Secondary | ICD-10-CM | POA: Diagnosis not present

## 2013-03-10 DIAGNOSIS — H2510 Age-related nuclear cataract, unspecified eye: Secondary | ICD-10-CM | POA: Diagnosis not present

## 2013-03-17 ENCOUNTER — Other Ambulatory Visit: Payer: Self-pay | Admitting: Cardiovascular Disease

## 2013-03-21 ENCOUNTER — Other Ambulatory Visit: Payer: Self-pay

## 2013-04-25 ENCOUNTER — Encounter: Payer: Self-pay | Admitting: Cardiovascular Disease

## 2013-04-25 ENCOUNTER — Ambulatory Visit (INDEPENDENT_AMBULATORY_CARE_PROVIDER_SITE_OTHER): Payer: Medicare Other | Admitting: Cardiovascular Disease

## 2013-04-25 VITALS — BP 130/70 | HR 75 | Wt 192.0 lb

## 2013-04-25 DIAGNOSIS — I469 Cardiac arrest, cause unspecified: Secondary | ICD-10-CM | POA: Diagnosis not present

## 2013-04-25 DIAGNOSIS — I2510 Atherosclerotic heart disease of native coronary artery without angina pectoris: Secondary | ICD-10-CM

## 2013-04-25 DIAGNOSIS — I2589 Other forms of chronic ischemic heart disease: Secondary | ICD-10-CM | POA: Diagnosis not present

## 2013-04-25 DIAGNOSIS — R1310 Dysphagia, unspecified: Secondary | ICD-10-CM | POA: Diagnosis not present

## 2013-04-25 DIAGNOSIS — I255 Ischemic cardiomyopathy: Secondary | ICD-10-CM

## 2013-04-25 NOTE — Assessment & Plan Note (Signed)
Euvolemic chronic baseline dyspnea mostly from lung disease  Stable continue current meds

## 2013-04-25 NOTE — Progress Notes (Signed)
Patient ID: [name] [name], male   DOB: 01/09/1926, 80 y.o.   MRN: [id number] [name] [name] is seen today after his AICD implant. He is a sudden death survivor with 3 stents. EF by MRI is 31% with a large scar. 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 He still has good quality of life and family support. He has returned to work. He has stopped smoking since his arrest . He denies SSCP, , or syncope.l Device has not fired Wound healed well. Compliant with meds and no SSCP. His grandaughter plays softball with my daughter and I get to see [name] at the ballfield. He looks good Recent exacerbation of breathing. CXR normal no CHF BNP normal and improved after seeing Dr Melvyn Novas and adjusting inhalers. Also has thalesemia. Getting iv iron Hb around 10 Does not want to have bone marrow biopsy  05/03/09 Stent to Ramus, circ and RCA Given number of stents will stay on Plavix indefinately  Recent hospitalization for UTI had some elevation in Cr and finished antibiotics Echo during hospitalization EF 55% Sees Dr Era Bumpers and still needs I/O cath at times  Seeing Dr Joya Gaskins next week Some difficulty getting his inhalers.       ROS: Denies fever, malais, weight loss, blurry vision, decreased visual acuity, cough, sputum, SOB, hemoptysis, pleuritic pain, palpitaitons, heartburn, abdominal pain, melena, lower extremity edema, claudication, or rash.  All other systems reviewed and negative  General: Affect appropriate Frail elderly male HEENT: normal Neck supple with no adenopathy JVP normal no bruits no thyromegaly Lungs clear with no wheezing and good diaphragmatic motion Heart:  S1/S2 no murmur, no rub, gallop or click  AICD under left clavicle  PMI normal Abdomen: benighn, BS positve, no tenderness, no AAA no bruit.  No HSM or HJR Distal pulses intact with no bruits Plus one RLE edema Neuro non-focal Skin warm and dry No muscular weakness   Current Outpatient Prescriptions   Medication Sig Dispense Refill  . albuterol (PROVENTIL) (5 MG/ML) 0.5% nebulizer solution Take 0.5 mLs (2.5 mg total) by nebulization every 4 (four) hours as needed for wheezing. And q3 hrs prn for SOB  20 mL  3  . ALPRAZolam (XANAX) 0.5 MG tablet Take 0.5 mg by mouth at bedtime as needed. For sleep      . arformoterol (BROVANA) 15 MCG/2ML NEBU Take 2 mLs (15 mcg total) by nebulization 2 (two) times daily.  120 mL  3  . aspirin EC 81 MG tablet Take 81 mg by mouth daily.      . budesonide (PULMICORT) 0.5 MG/2ML nebulizer solution Take 2 mLs (0.5 mg total) by nebulization 2 (two) times daily.  120 mL  3  . cephALEXin (KEFLEX) 500 MG capsule Take 1 capsule (500 mg total) by mouth 4 (four) times daily.  28 capsule  0  . clopidogrel (PLAVIX) 75 MG tablet TAKE 1 TABLET ONCE DAILY.  30 tablet  1  . famotidine (PEPCID) 20 MG tablet Take 1 tablet by mouth at bedtime.      . formoterol (PERFOROMIST) 20 MCG/2ML nebulizer solution Take 2 mLs (20 mcg total) by nebulization 2 (two) times daily.  60 mL  6  . losartan (COZAAR) 25 MG tablet TAKE 1 TABLET ONCE DAILY.  30 tablet  2  . metoprolol tartrate (LOPRESSOR) 25 MG tablet Take 12.5 mg by mouth 2 (two) times daily. 1/2 tab by mouth twice daily      . rosuvastatin (CRESTOR) 5 MG tablet Take 1 tablet (  5 mg total) by mouth daily.  30 tablet  12  . nitroGLYCERIN (NITROSTAT) 0.4 MG SL tablet Place 1 tablet (0.4 mg total) under the tongue every 5 (five) minutes as needed for chest pain.  25 tablet  4   No current facility-administered medications for this visit.    Allergies  Review of patient's allergies indicates no known allergies.  Electrocardiogram:  SR rate 75 PAC nonspecific ST/T wave changes   Assessment and Plan

## 2013-04-25 NOTE — Assessment & Plan Note (Signed)
No AICD d/c;'s  Pocket ok F/U with EP

## 2013-04-25 NOTE — Assessment & Plan Note (Signed)
Has issues with congestion likely aspiration and poor swallow mechanism  Makes him prone to coughing and gagging  F/U pulmonary

## 2013-04-25 NOTE — Patient Instructions (Signed)
Your physician wants you to follow-up in:  6 MONTHS WITH DR NISHAN  You will receive a reminder letter in the mail two months in advance. If you don't receive a letter, please call our office to schedule the follow-up appointment. Your physician recommends that you continue on your current medications as directed. Please refer to the Current Medication list given to you today. 

## 2013-04-25 NOTE — Assessment & Plan Note (Signed)
Stable with no angina and good activity level.  Continue medical Rx  

## 2013-05-12 ENCOUNTER — Encounter: Payer: Self-pay | Admitting: *Deleted

## 2013-05-16 ENCOUNTER — Ambulatory Visit (INDEPENDENT_AMBULATORY_CARE_PROVIDER_SITE_OTHER): Payer: Medicare Other | Admitting: *Deleted

## 2013-05-16 ENCOUNTER — Encounter: Payer: Self-pay | Admitting: Internal Medicine

## 2013-05-16 ENCOUNTER — Other Ambulatory Visit: Payer: Self-pay | Admitting: Cardiovascular Disease

## 2013-05-16 DIAGNOSIS — I509 Heart failure, unspecified: Secondary | ICD-10-CM | POA: Diagnosis not present

## 2013-05-16 DIAGNOSIS — I469 Cardiac arrest, cause unspecified: Secondary | ICD-10-CM | POA: Diagnosis not present

## 2013-05-18 LAB — MDC_IDC_ENUM_SESS_TYPE_REMOTE
Battery Remaining Longevity: 77 mo
Battery Remaining Percentage: 68 %
Brady Statistic AP VS Percent: 1 %
Brady Statistic AS VP Percent: 1 %
Brady Statistic AS VS Percent: 99 %
Brady Statistic RV Percent Paced: 1 %
HighPow Impedance: 66 Ohm
HighPow Impedance: 66 Ohm
Implantable Pulse Generator Serial Number: 623396
Lead Channel Impedance Value: 400 Ohm
Lead Channel Pacing Threshold Amplitude: 0.75 V
Lead Channel Pacing Threshold Amplitude: 1.25 V
Lead Channel Pacing Threshold Pulse Width: 0.5 ms
Lead Channel Sensing Intrinsic Amplitude: 3.6 mV
Lead Channel Setting Pacing Amplitude: 2 V
Lead Channel Setting Pacing Pulse Width: 0.5 ms
Lead Channel Setting Sensing Sensitivity: 0.5 mV
MDC IDC MSMT BATTERY VOLTAGE: 2.98 V
MDC IDC MSMT LEADCHNL RV IMPEDANCE VALUE: 390 Ohm
MDC IDC MSMT LEADCHNL RV PACING THRESHOLD PULSEWIDTH: 0.5 ms
MDC IDC MSMT LEADCHNL RV SENSING INTR AMPL: 10.2 mV
MDC IDC SESS DTM: 20150420194206
MDC IDC SET LEADCHNL RV PACING AMPLITUDE: 2.5 V
MDC IDC SET ZONE DETECTION INTERVAL: 270 ms
MDC IDC SET ZONE DETECTION INTERVAL: 335 ms
MDC IDC STAT BRADY AP VP PERCENT: 1 %
MDC IDC STAT BRADY RA PERCENT PACED: 1 %

## 2013-05-24 ENCOUNTER — Encounter: Payer: Self-pay | Admitting: *Deleted

## 2013-05-26 DIAGNOSIS — Z0000 Encounter for general adult medical examination without abnormal findings: Secondary | ICD-10-CM | POA: Diagnosis not present

## 2013-05-26 DIAGNOSIS — I10 Essential (primary) hypertension: Secondary | ICD-10-CM | POA: Diagnosis not present

## 2013-05-26 DIAGNOSIS — J449 Chronic obstructive pulmonary disease, unspecified: Secondary | ICD-10-CM | POA: Diagnosis not present

## 2013-05-26 DIAGNOSIS — E782 Mixed hyperlipidemia: Secondary | ICD-10-CM | POA: Diagnosis not present

## 2013-05-26 DIAGNOSIS — E119 Type 2 diabetes mellitus without complications: Secondary | ICD-10-CM | POA: Diagnosis not present

## 2013-05-26 DIAGNOSIS — D649 Anemia, unspecified: Secondary | ICD-10-CM | POA: Diagnosis not present

## 2013-05-26 DIAGNOSIS — N400 Enlarged prostate without lower urinary tract symptoms: Secondary | ICD-10-CM | POA: Diagnosis not present

## 2013-05-26 DIAGNOSIS — Z1331 Encounter for screening for depression: Secondary | ICD-10-CM | POA: Diagnosis not present

## 2013-05-26 DIAGNOSIS — N183 Chronic kidney disease, stage 3 unspecified: Secondary | ICD-10-CM | POA: Diagnosis not present

## 2013-05-27 ENCOUNTER — Ambulatory Visit (INDEPENDENT_AMBULATORY_CARE_PROVIDER_SITE_OTHER): Payer: Medicare Other | Admitting: Internal Medicine

## 2013-05-27 ENCOUNTER — Encounter: Payer: Self-pay | Admitting: Internal Medicine

## 2013-05-27 ENCOUNTER — Ambulatory Visit (INDEPENDENT_AMBULATORY_CARE_PROVIDER_SITE_OTHER)
Admission: RE | Admit: 2013-05-27 | Discharge: 2013-05-27 | Disposition: A | Discharge status: Home / Self Care | Payer: Medicare Other | Source: Ambulatory Visit | Attending: Internal Medicine | Admitting: Internal Medicine

## 2013-05-27 VITALS — BP 106/60 | HR 70 | Temp 97.9°F | Ht 71.0 in | Wt 188.0 lb

## 2013-05-27 DIAGNOSIS — J45909 Unspecified asthma, uncomplicated: Secondary | ICD-10-CM

## 2013-05-27 DIAGNOSIS — J438 Other emphysema: Secondary | ICD-10-CM | POA: Diagnosis not present

## 2013-05-27 DIAGNOSIS — I2510 Atherosclerotic heart disease of native coronary artery without angina pectoris: Secondary | ICD-10-CM | POA: Diagnosis not present

## 2013-05-27 NOTE — Progress Notes (Signed)
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  Assoc with hoarseness and variable sense of wheezing but  No unusual cough, assoc cp, purulent sputum or sinus/hb symptoms on present rx. No better with neb or spiriva, if anything worse breathing worse p rx  spiriva and also having urinary hesitance.  Limited by knees about same time as breath, confirmed during ov s desat. rec Labs c/w mild cri, anemia, nl tsh and bnp GERD diet Dc spiriva        10/31/2011 f/u ov/[name] [name] cc sob no better, but still knees stop him from being sob with activity, does have episodes where fm thinks he's wheezing, ? Better with neb, pt not sure about either. 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  Persistent focal smooth narrowing within the distal esophagus  concerning for stricture.  08/26/2012 Follow up  Returns for follow up and  orders for neb meds.  He was admitted earlier this year for UTI, and Asthma / COPD exacerbation. He was changed from Inhalers to [facility] -felt he would be able to use this better. Has stopped protonix and pepcid . As he does get reflux on occasion.  DME -[facility] not covering his nebs any longer , needs them to be changed to new company.  No chest pain , orthopnea or increased edema. Accompanied by his daughter today.  rec Continue on Brovana and Budesonide Neb Twice daily  , rinse after use.  We will send Neb prescription to [place].  May use Albuterol Neb every 4 hrs As needed  Wheezing/shortness of breath.  Begin Prilosec 20mg  daily before meal in am.  Begin Pepcid 20mg  At bedtime  .    10/26/2012 f/u ov/[name] [name] re:  Walking is limited knees / wear 02 at hs  Chief Complaint  Patient presents with  . Follow-up    Pt states no changes in his breathing since last visit. He states that he has noticed having leg cramps at night since started on nebs.    No need for albuterol, no  Sign cough. Swallowing ok  rec Try to leave off the pm treatments, but if breathing worse add back just  the budesonide in pm Only use your albuterol.prn    05/27/2013 f/u ov/[name] [name] re: prob asthma Chief Complaint  Patient presents with  . Follow-up    Pt states that his breathing is about the same since his last visit. No new co's today. He states has not had to use albuterol neb recently.    Just using Brovana and Bud around 10 pm And no neb saba in months Just sob up steps and not with any other desired activities.   No obvious day to day or daytime variabilty or assoc chronic cough or cp or chest tightness, subjective wheeze overt sinus or hb symptoms. No unusual exp hx or h/o childhood pna/ asthma or knowledge of premature birth.  Sleeping ok without nocturnal  or early am exacerbation  of respiratory  c/o's or need for noct saba. Also denies any obvious fluctuation of symptoms with weather or environmental  changes or other aggravating or alleviating factors except as outlined above   Current Medications, Allergies, Complete Past Medical History, Past Surgical History, Family History, and Social History were reviewed in [employer] record.       ROS  The following are not active complaints unless bolded sore throat, dysphagia, dental problems, itching, sneezing,  nasal congestion or excess/ purulent secretions, ear ache,   fever, chills, sweats, unintended wt loss, pleuritic or exertional cp, hemoptysis,  orthopnea pnd or leg swelling, presyncope, palpitations, heartburn, abdominal pain, anorexia, nausea, vomiting, diarrhea  or change in bowel or urinary habits, change in stools or urine, dysuria,hematuria,  rash, arthralgias, visual complaints, headache, numbness weakness or ataxia or problems with walking or coordination,  change in mood/affect or memory.            Objective:   Physical Exam  Mildly hoarse amb  wm no longer pseudowheezes  Wt 192 10/31/2011 > 190 12/08/11 > 12/23/2011  187 >187 08/26/2012 > 10/26/2012 183 > 05/27/2013  188   HEENT mild turbinate edema.  Oropharynx no thrush or excess pnd or cobblestoning.  No JVD or cervical adenopathy. Mild accessory muscle hypertrophy. Trachea midline, nl thryroid. Chest was hyperinflated by percussion with diminished breath sounds and moderate increased exp time without wheeze. Hoover sign positive at mid inspiration. Regular rate and rhythm without murmur gallop or rub or increase P2 or edema.  Abd: no hsm, nl excursion. Ext warm without cyanosis or clubbing.      CXR  05/27/2013 :   No active cardiopulmonary disease.  Mild emphysematous disease.            Assessment & Plan:

## 2013-05-27 NOTE — Patient Instructions (Signed)
Short of breath not out of breath for about 30 min sustained   No change in recommendations - return here if loosing any ground with exercise tolerance or bad cough  Please remember to go to the x-ray department downstairs for your tests - we will call you with the results when they are available.

## 2013-05-28 NOTE — Assessment & Plan Note (Addendum)
-  PFT's 04/16/11 FEV1  1.57 (60%) ratio 71 and 12% better p B2, dlco 41 corrects to 58    - 10/01/2011   Walked RA x one lap @ 185 stopped due to  Knees hurt, no sob or desat    - HFA 50% p coaching 12/23/2011  > neb dependent    - note abn swallowing study 03/16/12  Despite smoking hx this is not copd and he's doing well on min laba/ics though takes it at the unusual time of hs  All goals of chronic asthma control met including optimal function and elimination of symptoms with minimal need for rescue therapy.  Contingencies discussed in full including contacting this office immediately if not controlling the symptoms using the rule of two's.

## 2013-05-30 ENCOUNTER — Telehealth: Payer: Self-pay | Admitting: Internal Medicine

## 2013-05-30 NOTE — Progress Notes (Signed)
Quick Note:  See PN dated 05/30/13 ______

## 2013-05-30 NOTE — Progress Notes (Signed)
Quick Note:  LMTCB ______ 

## 2013-05-30 NOTE — Telephone Encounter (Signed)
Spoke with Lexine Baton, the pt's caregiver and notified of results  She verbalized understanding and will inform the pt

## 2013-05-30 NOTE — Progress Notes (Signed)
ICD remote with ICM 

## 2013-06-14 ENCOUNTER — Other Ambulatory Visit: Payer: Self-pay | Admitting: Cardiovascular Disease

## 2013-06-16 ENCOUNTER — Ambulatory Visit (INDEPENDENT_AMBULATORY_CARE_PROVIDER_SITE_OTHER): Payer: Medicare Other | Admitting: Internal Medicine

## 2013-06-16 ENCOUNTER — Encounter: Payer: Self-pay | Admitting: Internal Medicine

## 2013-06-16 VITALS — BP 124/74 | HR 76 | Ht 71.0 in | Wt 186.0 lb

## 2013-06-16 DIAGNOSIS — I2589 Other forms of chronic ischemic heart disease: Secondary | ICD-10-CM

## 2013-06-16 DIAGNOSIS — I509 Heart failure, unspecified: Secondary | ICD-10-CM | POA: Diagnosis not present

## 2013-06-16 DIAGNOSIS — I2510 Atherosclerotic heart disease of native coronary artery without angina pectoris: Secondary | ICD-10-CM

## 2013-06-16 DIAGNOSIS — I255 Ischemic cardiomyopathy: Secondary | ICD-10-CM

## 2013-06-16 LAB — MDC_IDC_ENUM_SESS_TYPE_INCLINIC
Battery Remaining Longevity: 80.4 mo
Brady Statistic RV Percent Paced: 0.05 %
Date Time Interrogation Session: 20150521170001
HIGH POWER IMPEDANCE MEASURED VALUE: 70.875
Implantable Pulse Generator Serial Number: 623396
Lead Channel Impedance Value: 387.5 Ohm
Lead Channel Pacing Threshold Amplitude: 0.5 V
Lead Channel Pacing Threshold Amplitude: 1 V
Lead Channel Pacing Threshold Pulse Width: 0.5 ms
Lead Channel Pacing Threshold Pulse Width: 0.5 ms
Lead Channel Sensing Intrinsic Amplitude: 4 mV
Lead Channel Setting Pacing Pulse Width: 0.5 ms
Lead Channel Setting Sensing Sensitivity: 0.5 mV
MDC IDC MSMT LEADCHNL RA IMPEDANCE VALUE: 412.5 Ohm
MDC IDC MSMT LEADCHNL RA PACING THRESHOLD AMPLITUDE: 0.5 V
MDC IDC MSMT LEADCHNL RA PACING THRESHOLD PULSEWIDTH: 0.5 ms
MDC IDC MSMT LEADCHNL RV PACING THRESHOLD AMPLITUDE: 1 V
MDC IDC MSMT LEADCHNL RV PACING THRESHOLD PULSEWIDTH: 0.5 ms
MDC IDC MSMT LEADCHNL RV SENSING INTR AMPL: 10.2 mV
MDC IDC SET LEADCHNL RA PACING AMPLITUDE: 2 V
MDC IDC SET LEADCHNL RV PACING AMPLITUDE: 2.5 V
MDC IDC SET ZONE DETECTION INTERVAL: 270 ms
MDC IDC STAT BRADY RA PERCENT PACED: 0.12 %
Zone Setting Detection Interval: 335 ms

## 2013-06-16 MED ORDER — NITROGLYCERIN 0.4 MG SL SUBL: 0.4000 mg | SUBLINGUAL_TABLET | SUBLINGUAL | Status: DC | PRN

## 2013-06-16 NOTE — Patient Instructions (Signed)
Remote monitoring is used to monitor your ICD from home. This monitoring reduces the number of office visits required to check your device to one time per year. It allows [place] to keep an eye on the functioning of your device to ensure it is working properly. You are scheduled for a device check from home on 09-19-2013. You may send your transmission at any time that day. If you have a wireless device, the transmission will be sent automatically. After your physician reviews your transmission, you will receive a postcard with your next transmission date.  Your physician recommends that you schedule a follow-up appointment in: 12 months with Dr.Allred  You have been enrolled in the [facility] clinic with Alvis Lemmings, RN.

## 2013-06-17 ENCOUNTER — Encounter: Payer: Self-pay | Admitting: Internal Medicine

## 2013-06-21 NOTE — Progress Notes (Signed)
PCP:  [name] Low, MD Primary Cardiologist:  [name]  The patient presents today for routine electrophysiology followup.  Since last being seen in our clinic, the patient reports doing reasonably well.  He has ongoing difficulty with chronic dyspnea.  He continues to show decline.  Today, he denies symptoms of palpitations, chest pain, lower extremity edema, dizziness, presyncope, syncope, or ICD shocks.  The patient feels that he is tolerating medications without difficulties and is otherwise without complaint today.   Past Medical History  Diagnosis Date  . CAD (coronary artery disease)   . Ischemic cardiomyopathy   . CHF (congestive heart failure)   . Dyslipidemia   . Diverticulosis of colon (without mention of hemorrhage)   . Diabetes mellitus   . Anemia, unspecified   . Unspecified disorder resulting from impaired renal function   . Chronic airway obstruction, not elsewhere classified     on o2 at night  . Prostatic hypertrophy     hx of  . Retention of urine, unspecified   . ICD (implantable cardiac defibrillator) in place     st jude  . Emphysema   . Hemorrhoids   . MI, old 2010 or 2011  . Macular degeneration   . Cataracts, bilateral    Past Surgical History  Procedure Laterality Date  . Pci      4/11  . Cardiac defibrillator placement  2011    st jude  . Pacemaker insertion  2011  . Tonsillectomy  80 yo  . Cataract extraction      Current Outpatient Prescriptions  Medication Sig Dispense Refill  . albuterol (PROVENTIL) (5 MG/ML) 0.5% nebulizer solution Take 0.5 mLs (2.5 mg total) by nebulization every 4 (four) hours as needed for wheezing. And q3 hrs prn for SOB  20 mL  3  . ALPRAZolam (XANAX) 0.5 MG tablet Take 0.5 mg by mouth at bedtime as needed. For sleep      . arformoterol (BROVANA) 15 MCG/2ML NEBU Take 2 mLs (15 mcg total) by nebulization 2 (two) times daily.  120 mL  3  . aspirin EC 81 MG tablet Take 81 mg by mouth daily.      . budesonide (PULMICORT)  0.5 MG/2ML nebulizer solution Take 2 mLs (0.5 mg total) by nebulization 2 (two) times daily.  120 mL  3  . clopidogrel (PLAVIX) 75 MG tablet TAKE 1 TABLET ONCE DAILY.  30 tablet  6  . famotidine (PEPCID) 20 MG tablet Take 1 tablet by mouth at bedtime.      Marland Kitchen losartan (COZAAR) 25 MG tablet TAKE 1 TABLET ONCE DAILY.  30 tablet  3  . metoprolol tartrate (LOPRESSOR) 25 MG tablet Take 12.5 mg by mouth 2 (two) times daily.       . naproxen (NAPROSYN) 500 MG tablet Take 500 mg by mouth at bedtime as needed.      . rosuvastatin (CRESTOR) 5 MG tablet Take 1 tablet (5 mg total) by mouth daily.  30 tablet  12  . nitroGLYCERIN (NITROSTAT) 0.4 MG SL tablet Place 1 tablet (0.4 mg total) under the tongue every 5 (five) minutes as needed for chest pain.  25 tablet  4   No current facility-administered medications for this visit.    No Known Allergies  History   Social History  . Marital Status: Married    Spouse Name: N/A    Number of Children: N/A  . Years of Education: N/A   Occupational History  . dentist    Social  History Main Topics  . Smoking status: Former Smoker    Types: Cigarettes    Quit date: 04/27/2009  . Smokeless tobacco: Never Used  . Alcohol Use: Yes     Comment: occasional beer  . Drug Use: No  . Sexual Activity: Not on file   Other Topics Concern  . Not on file   Social History Narrative   Lives in a house with stairs, which he needs to use, with his wife.  Wife is not able-bodied.  Does not use a cane or walker, but is unsteady on his feet.        Colletta [name] [name]:  [phone] cell, [phone], daughter, POA.     [name] [name]:  [phone], nephew   [name] [name]:  [phone], daughter             Family History  Problem Relation Age of Onset  . Lung cancer Father     smoker  . Coronary artery disease Mother   . Stroke Mother   . Breast cancer Sister   . Atrial fibrillation Daughter   . Congestive Heart Failure Mother     Physical Exam: Filed  Vitals:   06/16/13 1621  BP: 124/74  Pulse: 76  Height: 5\' 11"  (1.803 m)  Weight: 186 lb (84.369 kg)    GEN- The patient is elderly appearing, alert and oriented x 3 today.   Head- normocephalic, atraumatic Eyes-  Sclera clear, conjunctiva pink Ears- hearing intact Oropharynx- clear Neck- supple, no JVP Lymph- no cervical lymphadenopathy Lungs- diffuse expiratory wheezes with occasional purse lips breathing Chest- ICD pocket is well healed Heart- Regular rate and rhythm, no murmurs, rubs or gallops, PMI not laterally displaced GI- soft, NT, ND, + BS Extremities- no clubbing, cyanosis, or edemat  ICD interrogation- reviewed in detail today,  See PACEART report  Assessment and Plan:  1. Ischemic CM/ chronic systolic dysfunction Normal ICD function See Pace Art report No changes today  2. COPD- this is of major concern He follows with Dr Melvyn Novas.  Remote monitoring Will enroll in the ICM clinc for hemodynamic monitoring Follow-up with [name] as scheduled I will see in a year

## 2013-06-27 ENCOUNTER — Encounter: Payer: Self-pay | Admitting: *Deleted

## 2013-07-01 ENCOUNTER — Inpatient Hospital Stay (HOSPITAL_COMMUNITY)
Admission: EM | Admit: 2013-07-01 | Discharge: 2013-07-04 | DRG: 683 | Disposition: A | Discharge status: Home Health | Payer: Medicare Other | Attending: Internal Medicine | Admitting: Internal Medicine

## 2013-07-01 ENCOUNTER — Encounter (HOSPITAL_COMMUNITY): Payer: Self-pay | Admitting: Emergency Medicine

## 2013-07-01 ENCOUNTER — Emergency Department (HOSPITAL_COMMUNITY): Payer: Medicare Other

## 2013-07-01 DIAGNOSIS — I2589 Other forms of chronic ischemic heart disease: Secondary | ICD-10-CM | POA: Diagnosis present

## 2013-07-01 DIAGNOSIS — N319 Neuromuscular dysfunction of bladder, unspecified: Secondary | ICD-10-CM | POA: Diagnosis present

## 2013-07-01 DIAGNOSIS — R944 Abnormal results of kidney function studies: Secondary | ICD-10-CM | POA: Diagnosis not present

## 2013-07-01 DIAGNOSIS — R143 Flatulence: Secondary | ICD-10-CM | POA: Diagnosis not present

## 2013-07-01 DIAGNOSIS — Z79899 Other long term (current) drug therapy: Secondary | ICD-10-CM

## 2013-07-01 DIAGNOSIS — N189 Chronic kidney disease, unspecified: Secondary | ICD-10-CM | POA: Diagnosis not present

## 2013-07-01 DIAGNOSIS — N200 Calculus of kidney: Secondary | ICD-10-CM | POA: Diagnosis present

## 2013-07-01 DIAGNOSIS — I469 Cardiac arrest, cause unspecified: Secondary | ICD-10-CM

## 2013-07-01 DIAGNOSIS — M545 Low back pain, unspecified: Secondary | ICD-10-CM | POA: Diagnosis present

## 2013-07-01 DIAGNOSIS — Z801 Family history of malignant neoplasm of trachea, bronchus and lung: Secondary | ICD-10-CM

## 2013-07-01 DIAGNOSIS — I2510 Atherosclerotic heart disease of native coronary artery without angina pectoris: Secondary | ICD-10-CM | POA: Diagnosis not present

## 2013-07-01 DIAGNOSIS — N201 Calculus of ureter: Secondary | ICD-10-CM | POA: Diagnosis not present

## 2013-07-01 DIAGNOSIS — Z9861 Coronary angioplasty status: Secondary | ICD-10-CM

## 2013-07-01 DIAGNOSIS — N183 Chronic kidney disease, stage 3 unspecified: Secondary | ICD-10-CM | POA: Diagnosis present

## 2013-07-01 DIAGNOSIS — D638 Anemia in other chronic diseases classified elsewhere: Secondary | ICD-10-CM | POA: Diagnosis not present

## 2013-07-01 DIAGNOSIS — Z7982 Long term (current) use of aspirin: Secondary | ICD-10-CM

## 2013-07-01 DIAGNOSIS — R062 Wheezing: Secondary | ICD-10-CM | POA: Diagnosis present

## 2013-07-01 DIAGNOSIS — Z95810 Presence of automatic (implantable) cardiac defibrillator: Secondary | ICD-10-CM | POA: Diagnosis not present

## 2013-07-01 DIAGNOSIS — N401 Enlarged prostate with lower urinary tract symptoms: Secondary | ICD-10-CM | POA: Diagnosis present

## 2013-07-01 DIAGNOSIS — H3530 Unspecified macular degeneration: Secondary | ICD-10-CM | POA: Diagnosis present

## 2013-07-01 DIAGNOSIS — J438 Other emphysema: Secondary | ICD-10-CM | POA: Diagnosis present

## 2013-07-01 DIAGNOSIS — R141 Gas pain: Secondary | ICD-10-CM | POA: Diagnosis not present

## 2013-07-01 DIAGNOSIS — K5900 Constipation, unspecified: Secondary | ICD-10-CM | POA: Diagnosis present

## 2013-07-01 DIAGNOSIS — Z87891 Personal history of nicotine dependence: Secondary | ICD-10-CM

## 2013-07-01 DIAGNOSIS — N138 Other obstructive and reflux uropathy: Secondary | ICD-10-CM | POA: Diagnosis present

## 2013-07-01 DIAGNOSIS — E119 Type 2 diabetes mellitus without complications: Secondary | ICD-10-CM | POA: Diagnosis not present

## 2013-07-01 DIAGNOSIS — I252 Old myocardial infarction: Secondary | ICD-10-CM

## 2013-07-01 DIAGNOSIS — I509 Heart failure, unspecified: Secondary | ICD-10-CM | POA: Diagnosis not present

## 2013-07-01 DIAGNOSIS — Z803 Family history of malignant neoplasm of breast: Secondary | ICD-10-CM | POA: Diagnosis not present

## 2013-07-01 DIAGNOSIS — D563 Thalassemia minor: Secondary | ICD-10-CM | POA: Diagnosis not present

## 2013-07-01 DIAGNOSIS — Z8249 Family history of ischemic heart disease and other diseases of the circulatory system: Secondary | ICD-10-CM

## 2013-07-01 DIAGNOSIS — R310 Gross hematuria: Secondary | ICD-10-CM | POA: Diagnosis not present

## 2013-07-01 DIAGNOSIS — N1330 Unspecified hydronephrosis: Secondary | ICD-10-CM | POA: Diagnosis not present

## 2013-07-01 DIAGNOSIS — J4489 Other specified chronic obstructive pulmonary disease: Secondary | ICD-10-CM | POA: Diagnosis not present

## 2013-07-01 DIAGNOSIS — N179 Acute kidney failure, unspecified: Principal | ICD-10-CM | POA: Diagnosis present

## 2013-07-01 DIAGNOSIS — N139 Obstructive and reflux uropathy, unspecified: Secondary | ICD-10-CM | POA: Diagnosis present

## 2013-07-01 DIAGNOSIS — E785 Hyperlipidemia, unspecified: Secondary | ICD-10-CM | POA: Diagnosis present

## 2013-07-01 DIAGNOSIS — Z9849 Cataract extraction status, unspecified eye: Secondary | ICD-10-CM

## 2013-07-01 DIAGNOSIS — J449 Chronic obstructive pulmonary disease, unspecified: Secondary | ICD-10-CM | POA: Diagnosis not present

## 2013-07-01 DIAGNOSIS — I5022 Chronic systolic (congestive) heart failure: Secondary | ICD-10-CM | POA: Diagnosis not present

## 2013-07-01 DIAGNOSIS — I129 Hypertensive chronic kidney disease with stage 1 through stage 4 chronic kidney disease, or unspecified chronic kidney disease: Secondary | ICD-10-CM | POA: Diagnosis not present

## 2013-07-01 DIAGNOSIS — Z823 Family history of stroke: Secondary | ICD-10-CM

## 2013-07-01 DIAGNOSIS — N390 Urinary tract infection, site not specified: Secondary | ICD-10-CM | POA: Diagnosis not present

## 2013-07-01 DIAGNOSIS — R142 Eructation: Secondary | ICD-10-CM | POA: Diagnosis not present

## 2013-07-01 LAB — URINALYSIS, ROUTINE W REFLEX MICROSCOPIC
Bilirubin Urine: NEGATIVE
Glucose, UA: NEGATIVE mg/dL
KETONES UR: NEGATIVE mg/dL
NITRITE: NEGATIVE
Protein, ur: NEGATIVE mg/dL
SPECIFIC GRAVITY, URINE: 1.018 (ref 1.005–1.030)
UROBILINOGEN UA: 0.2 mg/dL (ref 0.0–1.0)
pH: 5.5 (ref 5.0–8.0)

## 2013-07-01 LAB — BASIC METABOLIC PANEL
BUN: 46 mg/dL — AB (ref 6–23)
CHLORIDE: 102 meq/L (ref 96–112)
CO2: 23 mEq/L (ref 19–32)
Calcium: 9.3 mg/dL (ref 8.4–10.5)
Creatinine, Ser: 2.19 mg/dL — ABNORMAL HIGH (ref 0.50–1.35)
GFR calc non Af Amer: 26 mL/min — ABNORMAL LOW (ref >90)
GFR, EST AFRICAN AMERICAN: 30 mL/min — AB (ref >90)
Glucose, Bld: 107 mg/dL — ABNORMAL HIGH (ref 70–99)
POTASSIUM: 5.1 meq/L (ref 3.7–5.3)
Sodium: 141 mEq/L (ref 137–147)

## 2013-07-01 LAB — HEMOGLOBIN A1C
HEMOGLOBIN A1C: 5.6 % (ref <5.7)
MEAN PLASMA GLUCOSE: 114 mg/dL (ref <117)

## 2013-07-01 LAB — CBC WITH DIFFERENTIAL/PLATELET
BASOS ABS: 0 10*3/uL (ref 0.0–0.1)
Basophils Relative: 0 % (ref 0–1)
Eosinophils Absolute: 0.2 10*3/uL (ref 0.0–0.7)
Eosinophils Relative: 2 % (ref 0–5)
HCT: 30.6 % — ABNORMAL LOW (ref 39.0–52.0)
Hemoglobin: 9.8 g/dL — ABNORMAL LOW (ref 13.0–17.0)
LYMPHS PCT: 12 % (ref 12–46)
Lymphs Abs: 1 10*3/uL (ref 0.7–4.0)
MCH: 21.4 pg — AB (ref 26.0–34.0)
MCHC: 32 g/dL (ref 30.0–36.0)
MCV: 67 fL — ABNORMAL LOW (ref 78.0–100.0)
MONO ABS: 0.4 10*3/uL (ref 0.1–1.0)
Monocytes Relative: 5 % (ref 3–12)
Neutro Abs: 6.8 10*3/uL (ref 1.7–7.7)
Neutrophils Relative %: 81 % — ABNORMAL HIGH (ref 43–77)
PLATELETS: 104 10*3/uL — AB (ref 150–400)
RBC: 4.57 MIL/uL (ref 4.22–5.81)
RDW: 17.6 % — ABNORMAL HIGH (ref 11.5–15.5)
WBC: 8.4 10*3/uL (ref 4.0–10.5)

## 2013-07-01 LAB — CBC
HEMATOCRIT: 29.1 % — AB (ref 39.0–52.0)
HEMOGLOBIN: 9.4 g/dL — AB (ref 13.0–17.0)
MCH: 21.7 pg — AB (ref 26.0–34.0)
MCHC: 32.3 g/dL (ref 30.0–36.0)
MCV: 67.1 fL — ABNORMAL LOW (ref 78.0–100.0)
Platelets: 91 10*3/uL — ABNORMAL LOW (ref 150–400)
RBC: 4.34 MIL/uL (ref 4.22–5.81)
RDW: 17.4 % — AB (ref 11.5–15.5)
WBC: 7.5 10*3/uL (ref 4.0–10.5)

## 2013-07-01 LAB — POC OCCULT BLOOD, ED: Fecal Occult Bld: POSITIVE — AB

## 2013-07-01 LAB — CREATININE, SERUM
Creatinine, Ser: 2.03 mg/dL — ABNORMAL HIGH (ref 0.50–1.35)
GFR calc Af Amer: 32 mL/min — ABNORMAL LOW (ref >90)
GFR calc non Af Amer: 28 mL/min — ABNORMAL LOW (ref >90)

## 2013-07-01 LAB — TROPONIN I: Troponin I: <0.3 ng/mL (ref <0.30)

## 2013-07-01 LAB — PRO B NATRIURETIC PEPTIDE: PRO B NATRI PEPTIDE: 330.2 pg/mL (ref 0–450)

## 2013-07-01 LAB — I-STAT TROPONIN, ED: Troponin i, poc: 0 ng/mL (ref 0.00–0.08)

## 2013-07-01 LAB — URINE MICROSCOPIC-ADD ON

## 2013-07-01 MED ORDER — ONDANSETRON HCL 4 MG PO TABS: 4.0000 mg | ORAL_TABLET | Freq: Four times a day (QID) | ORAL | Status: DC | PRN

## 2013-07-01 MED ORDER — ONDANSETRON HCL 4 MG/2ML IJ SOLN: 4.0000 mg | Freq: Four times a day (QID) | INTRAMUSCULAR | Status: DC | PRN

## 2013-07-01 MED ORDER — DEXTROSE 5 % IV SOLN
1.0000 g | INTRAVENOUS | Status: DC
  Administered 2013-07-01 – 2013-07-02 (×2): 1 g via INTRAVENOUS
  Filled 2013-07-01 (×3): qty 10

## 2013-07-01 MED ORDER — ASPIRIN EC 81 MG PO TBEC
81.0000 mg | DELAYED_RELEASE_TABLET | Freq: Every day | ORAL | Status: DC
  Administered 2013-07-01 – 2013-07-03 (×3): 81 mg via ORAL
  Filled 2013-07-01 (×4): qty 1

## 2013-07-01 MED ORDER — SODIUM CHLORIDE 0.9 % IV BOLUS (SEPSIS)
500.0000 mL | Freq: Once | INTRAVENOUS | Status: AC
  Administered 2013-07-01: 500 mL via INTRAVENOUS

## 2013-07-01 MED ORDER — ALBUTEROL SULFATE (2.5 MG/3ML) 0.083% IN NEBU
2.5000 mg | INHALATION_SOLUTION | RESPIRATORY_TRACT | Status: DC | PRN
  Administered 2013-07-02 – 2013-07-04 (×2): 2.5 mg via RESPIRATORY_TRACT
  Filled 2013-07-01 (×2): qty 3

## 2013-07-01 MED ORDER — SODIUM CHLORIDE 0.9 % IJ SOLN
3.0000 mL | Freq: Two times a day (BID) | INTRAMUSCULAR | Status: DC
  Administered 2013-07-01 – 2013-07-03 (×5): 3 mL via INTRAVENOUS

## 2013-07-01 MED ORDER — ATORVASTATIN CALCIUM 10 MG PO TABS
10.0000 mg | ORAL_TABLET | Freq: Every day | ORAL | Status: DC
  Administered 2013-07-01 – 2013-07-03 (×3): 10 mg via ORAL
  Filled 2013-07-01 (×4): qty 1

## 2013-07-01 MED ORDER — CLOPIDOGREL BISULFATE 75 MG PO TABS
75.0000 mg | ORAL_TABLET | Freq: Every day | ORAL | Status: DC
  Administered 2013-07-01 – 2013-07-04 (×4): 75 mg via ORAL
  Filled 2013-07-01 (×4): qty 1

## 2013-07-01 MED ORDER — SENNA 8.6 MG PO TABS
1.0000 | ORAL_TABLET | Freq: Every day | ORAL | Status: DC | PRN
  Administered 2013-07-01 – 2013-07-03 (×3): 8.6 mg via ORAL
  Filled 2013-07-01 (×3): qty 1

## 2013-07-01 MED ORDER — ACETAMINOPHEN 650 MG RE SUPP: 650.0000 mg | Freq: Four times a day (QID) | RECTAL | Status: DC | PRN

## 2013-07-01 MED ORDER — ACETAMINOPHEN 325 MG PO TABS
650.0000 mg | ORAL_TABLET | Freq: Four times a day (QID) | ORAL | Status: DC | PRN
  Administered 2013-07-02 – 2013-07-03 (×2): 650 mg via ORAL
  Filled 2013-07-01 (×2): qty 2

## 2013-07-01 MED ORDER — BUDESONIDE 0.5 MG/2ML IN SUSP
0.5000 mg | Freq: Two times a day (BID) | RESPIRATORY_TRACT | Status: DC
  Administered 2013-07-01 – 2013-07-04 (×6): 0.5 mg via RESPIRATORY_TRACT
  Filled 2013-07-01 (×14): qty 2

## 2013-07-01 MED ORDER — ARFORMOTEROL TARTRATE 15 MCG/2ML IN NEBU
15.0000 ug | INHALATION_SOLUTION | Freq: Two times a day (BID) | RESPIRATORY_TRACT | Status: DC
  Administered 2013-07-01 – 2013-07-04 (×6): 15 ug via RESPIRATORY_TRACT
  Filled 2013-07-01 (×10): qty 2

## 2013-07-01 MED ORDER — LIDOCAINE 5 % EX PTCH
1.0000 | MEDICATED_PATCH | CUTANEOUS | Status: DC
  Administered 2013-07-01 – 2013-07-03 (×3): 1 via TRANSDERMAL
  Filled 2013-07-01 (×4): qty 1

## 2013-07-01 MED ORDER — NITROGLYCERIN 0.4 MG SL SUBL: 0.4000 mg | SUBLINGUAL_TABLET | SUBLINGUAL | Status: DC | PRN

## 2013-07-01 MED ORDER — MORPHINE SULFATE 4 MG/ML IJ SOLN
2.0000 mg | Freq: Once | INTRAMUSCULAR | Status: AC
  Administered 2013-07-01: 2 mg via INTRAVENOUS
  Filled 2013-07-01: qty 1

## 2013-07-01 MED ORDER — FAMOTIDINE 20 MG PO TABS
20.0000 mg | ORAL_TABLET | Freq: Every day | ORAL | Status: DC
  Administered 2013-07-01 – 2013-07-03 (×3): 20 mg via ORAL
  Filled 2013-07-01 (×4): qty 1

## 2013-07-01 MED ORDER — METOPROLOL TARTRATE 12.5 MG HALF TABLET
12.5000 mg | ORAL_TABLET | Freq: Two times a day (BID) | ORAL | Status: DC
  Administered 2013-07-01 – 2013-07-04 (×7): 12.5 mg via ORAL
  Filled 2013-07-01 (×8): qty 1

## 2013-07-01 MED ORDER — SENNOSIDES-DOCUSATE SODIUM 8.6-50 MG PO TABS
1.0000 | ORAL_TABLET | Freq: Every evening | ORAL | Status: DC | PRN
  Administered 2013-07-01: 1 via ORAL
  Filled 2013-07-01: qty 1

## 2013-07-01 MED ORDER — LOSARTAN POTASSIUM 25 MG PO TABS
25.0000 mg | ORAL_TABLET | Freq: Every day | ORAL | Status: DC
  Administered 2013-07-01: 25 mg via ORAL
  Filled 2013-07-01 (×2): qty 1

## 2013-07-01 MED ORDER — ENOXAPARIN SODIUM 30 MG/0.3ML ~~LOC~~ SOLN
30.0000 mg | SUBCUTANEOUS | Status: DC
  Administered 2013-07-01 – 2013-07-03 (×3): 30 mg via SUBCUTANEOUS
  Filled 2013-07-01 (×4): qty 0.3

## 2013-07-01 MED ORDER — SODIUM CHLORIDE 0.9 % IV SOLN
INTRAVENOUS | Status: AC
  Administered 2013-07-01: 13:00:00 via INTRAVENOUS

## 2013-07-01 MED ORDER — MELOXICAM 7.5 MG PO TABS
7.5000 mg | ORAL_TABLET | Freq: Every day | ORAL | Status: DC
  Administered 2013-07-01 – 2013-07-02 (×2): 7.5 mg via ORAL
  Filled 2013-07-01 (×2): qty 1

## 2013-07-01 MED ORDER — ALPRAZOLAM 0.5 MG PO TABS
0.5000 mg | ORAL_TABLET | Freq: Every evening | ORAL | Status: DC | PRN
  Administered 2013-07-01: 0.5 mg via ORAL
  Filled 2013-07-01 (×2): qty 1

## 2013-07-01 NOTE — ED Notes (Addendum)
Per Pt's ICD card:   Murphy #: [id number] Serial #: [phone]

## 2013-07-01 NOTE — ED Notes (Signed)
Per family, patient self caths at home.

## 2013-07-01 NOTE — ED Notes (Signed)
Per pt report: Pt c/o pain in his right lower back that feels like pressure.  Pt had similar pain before that resulted MI. Pt a/o x 4. Pt reports that pain started in his right shoulder. Pt denies SOB, N/V, lightheadedness. No diaphoresis noted.

## 2013-07-01 NOTE — ED Notes (Signed)
Per [facility] Rep, interrogation was successful no events or issues recorded.

## 2013-07-01 NOTE — ED Provider Notes (Signed)
CSN: [id number]     Arrival date & time 07/01/13  0607 History   First MD Initiated Contact with Patient 07/01/13 [phone]     Chief Complaint  Patient presents with  . Chest Pain     (Consider location/radiation/quality/duration/timing/severity/associated sxs/prior Treatment) HPI Comments: The patient is a 36 room and with past medical history of MI status post 3 stents and ICD, CHF, prostatic hypertrophy, diabetes, presenting to the emergency department chief complaint of constant right lower back/flank pain pain since last night.  The patient and patient's daughter also reports similar discomfort with previous MI. He reports history of low back pain in the in the past, onset the mornings and self resolving throughout the day.  He reports this episode of discomfort is more intense and kept him up all night last night.  He reports chronic SOB, unchanged from baseline.  Patient's daughter states increase in wheezing.  During provider encounter patient reported an abrupt onset of left upper extremity pain. He reports constipation, last BM 2 days ago.  Normally has a BM every day.  He reports intermittent blood in stools. Reports associated abdominal distention.  He chronic self catheterization, denies hematuria. PCP: [name], MD Pulmonologist: Wert Cardiologist [place] Patient is a full code.   Patient is a 80 y.o. male presenting with chest pain. The history is provided by the patient, the EMS personnel and a relative. No language interpreter was used.  Chest Pain Associated symptoms: back pain and shortness of breath   Associated symptoms: no fever, no headache, no nausea and not vomiting     Past Medical History  Diagnosis Date  . CAD (coronary artery disease)   . Ischemic cardiomyopathy   . CHF (congestive heart failure)   . Dyslipidemia   . Diverticulosis of colon (without mention of hemorrhage)   . Diabetes mellitus   . Anemia, unspecified   . Unspecified disorder resulting from  impaired renal function   . Chronic airway obstruction, not elsewhere classified     on o2 at night  . Prostatic hypertrophy     hx of  . Retention of urine, unspecified   . ICD (implantable cardiac defibrillator) in place     st jude  . Emphysema   . Hemorrhoids   . MI, old 2010 or 2011  . Macular degeneration   . Cataracts, bilateral    Past Surgical History  Procedure Laterality Date  . Pci      4/11  . Cardiac defibrillator placement  2011    st jude  . Pacemaker insertion  2011  . Tonsillectomy  80 yo  . Cataract extraction     Family History  Problem Relation Age of Onset  . Lung cancer Father     smoker  . Coronary artery disease Mother   . Stroke Mother   . Breast cancer Sister   . Atrial fibrillation Daughter   . Congestive Heart Failure Mother    History  Substance Use Topics  . Smoking status: Former Smoker    Types: Cigarettes    Quit date: 04/27/2009  . Smokeless tobacco: Never Used  . Alcohol Use: Yes     Comment: occasional beer    Review of Systems  Constitutional: Negative for fever and chills.  Respiratory: Positive for shortness of breath and wheezing.   Cardiovascular: Positive for chest pain. Negative for leg swelling.  Gastrointestinal: Positive for constipation, abdominal distention and anal bleeding. Negative for nausea, vomiting and diarrhea.  Genitourinary: Negative for dysuria and  hematuria.  Musculoskeletal: Positive for back pain.  Neurological: Negative for headaches.      Allergies  Review of patient's allergies indicates no known allergies.  Home Medications   Prior to Admission medications   Medication Sig Start Date End Date Taking? Authorizing Provider  albuterol (PROVENTIL) (5 MG/ML) 0.5% nebulizer solution Take 0.5 mLs (2.5 mg total) by nebulization every 4 (four) hours as needed for wheezing. And q3 hrs prn for SOB 08/26/12   Tammy S Parrett, NP  ALPRAZolam (XANAX) 0.5 MG tablet Take 0.5 mg by mouth at bedtime as  needed. For sleep    Historical Provider, MD  arformoterol (BROVANA) 15 MCG/2ML NEBU Take 2 mLs (15 mcg total) by nebulization 2 (two) times daily. 08/31/12   Tammy S Parrett, NP  aspirin EC 81 MG tablet Take 81 mg by mouth daily.    Historical Provider, MD  budesonide (PULMICORT) 0.5 MG/2ML nebulizer solution Take 2 mLs (0.5 mg total) by nebulization 2 (two) times daily. 08/31/12   Tammy S Parrett, NP  clopidogrel (PLAVIX) 75 MG tablet TAKE 1 TABLET ONCE DAILY. 05/16/13   Josue Hector, MD  famotidine (PEPCID) 20 MG tablet Take 1 tablet by mouth at bedtime. 08/11/12   Historical Provider, MD  losartan (COZAAR) 25 MG tablet TAKE 1 TABLET ONCE DAILY.    Josue Hector, MD  metoprolol tartrate (LOPRESSOR) 25 MG tablet Take 12.5 mg by mouth 2 (two) times daily.     Historical Provider, MD  naproxen (NAPROSYN) 500 MG tablet Take 500 mg by mouth at bedtime as needed.    Historical Provider, MD  nitroGLYCERIN (NITROSTAT) 0.4 MG SL tablet Place 1 tablet (0.4 mg total) under the tongue every 5 (five) minutes as needed for chest pain. 06/16/13 02/26/15  Thompson Grayer, MD  rosuvastatin (CRESTOR) 5 MG tablet Take 1 tablet (5 mg total) by mouth daily. 04/19/12   Josue Hector, MD   BP 152/75  Pulse 70  Temp(Src) 98.7 F (37.1 C) (Oral)  Resp 17  SpO2 96% Physical Exam  Nursing note and vitals reviewed. Constitutional: He is oriented to person, place, and time. He appears well-developed and well-nourished. He is active and cooperative.  Non-toxic appearance. He does not have a sickly appearance. He does not appear ill. No distress.  HENT:  Head: Normocephalic and atraumatic.  Eyes: EOM are normal. Pupils are equal, round, and reactive to light. No scleral icterus.  Neck: Neck supple.  Cardiovascular: Normal rate.  An irregular rhythm present.  Murmur heard. Trace Pitting edema bilaterally  Pulmonary/Chest: Effort normal. No respiratory distress. He has wheezes. He has no rales. He exhibits no tenderness.   Patient is able to speak in complete sentences. Pacer site left upper chest, nontender to palpation no erythema.  Abdominal: Soft. Bowel sounds are normal. There is no tenderness. There is no rigidity, no rebound, no guarding and no CVA tenderness.  Genitourinary: Rectal exam shows fissure. Rectal exam shows no external hemorrhoid and no tenderness. Guaiac positive stool.  No stool in rectal vault.  Minimal amount of blood around anus. No active bleeding from fissure. Chaperone present.   Musculoskeletal: Normal range of motion. He exhibits no edema.  Neurological: He is alert and oriented to person, place, and time.  Skin: Skin is warm and dry. No rash noted. He is not diaphoretic.  Psychiatric: He has a normal mood and affect. His behavior is normal.    ED Course  Procedures (including critical care time) Results for orders placed  during the hospital encounter of 07/01/13  CBC WITH DIFFERENTIAL      Result Value Ref Range   WBC 8.4  4.0 - 10.5 K/uL   RBC 4.57  4.22 - 5.81 MIL/uL   Hemoglobin 9.8 (*) 13.0 - 17.0 g/dL   HCT 30.6 (*) 39.0 - 52.0 %   MCV 67.0 (*) 78.0 - 100.0 fL   MCH 21.4 (*) 26.0 - 34.0 pg   MCHC 32.0  30.0 - 36.0 g/dL   RDW 17.6 (*) 11.5 - 15.5 %   Platelets 104 (*) 150 - 400 K/uL   Neutrophils Relative % 81 (*) 43 - 77 %   Lymphocytes Relative 12  12 - 46 %   Monocytes Relative 5  3 - 12 %   Eosinophils Relative 2  0 - 5 %   Basophils Relative 0  0 - 1 %   Neutro Abs 6.8  1.7 - 7.7 K/uL   Lymphs Abs 1.0  0.7 - 4.0 K/uL   Monocytes Absolute 0.4  0.1 - 1.0 K/uL   Eosinophils Absolute 0.2  0.0 - 0.7 K/uL   Basophils Absolute 0.0  0.0 - 0.1 K/uL   RBC Morphology ELLIPTOCYTES     WBC Morphology DOHLE BODIES    BASIC METABOLIC PANEL      Result Value Ref Range   Sodium 141  137 - 147 mEq/L   Potassium 5.1  3.7 - 5.3 mEq/L   Chloride 102  96 - 112 mEq/L   CO2 23  19 - 32 mEq/L   Glucose, Bld 107 (*) 70 - 99 mg/dL   BUN 46 (*) 6 - 23 mg/dL   Creatinine, Ser  2.19 (*) 0.50 - 1.35 mg/dL   Calcium 9.3  8.4 - 10.5 mg/dL   GFR calc non Af Amer 26 (*) >90 mL/min   GFR calc Af Amer 30 (*) >90 mL/min  PRO B NATRIURETIC PEPTIDE      Result Value Ref Range   Pro B Natriuretic peptide (BNP) 330.2  0 - 450 pg/mL  URINALYSIS, ROUTINE W REFLEX MICROSCOPIC      Result Value Ref Range   Color, Urine YELLOW  YELLOW   APPearance CLEAR  CLEAR   Specific Gravity, Urine 1.018  1.005 - 1.030   pH 5.5  5.0 - 8.0   Glucose, UA NEGATIVE  NEGATIVE mg/dL   Hgb urine dipstick LARGE (*) NEGATIVE   Bilirubin Urine NEGATIVE  NEGATIVE   Ketones, ur NEGATIVE  NEGATIVE mg/dL   Protein, ur NEGATIVE  NEGATIVE mg/dL   Urobilinogen, UA 0.2  0.0 - 1.0 mg/dL   Nitrite NEGATIVE  NEGATIVE   Leukocytes, UA SMALL (*) NEGATIVE  URINE MICROSCOPIC-ADD ON      Result Value Ref Range   WBC, UA 3-6  <3 WBC/hpf   RBC / HPF 21-50  <3 RBC/hpf   Bacteria, UA FEW (*) RARE  I-STAT TROPOININ, ED      Result Value Ref Range   Troponin i, poc 0.00  0.00 - 0.08 ng/mL   Comment 3           POC OCCULT BLOOD, ED      Result Value Ref Range   Fecal Occult Bld POSITIVE (*) NEGATIVE   Dg Chest 2 View  07/01/2013   CLINICAL DATA:  Chest pain  EXAM: CHEST  2 VIEW  COMPARISON:  05/27/2013  FINDINGS: Unchanged orientation of dual-chamber left approach pacer/ICD leads.  No cardiomegaly.  Stable elongation of the aorta.  Chronic pulmonary hyperinflation and interstitial coarsening. No superimposed consolidation, edema, effusion, or pneumothorax. Nipple shadows noted, also seen previously.  IMPRESSION: COPD.  No acute superimposed process.   Electronically Signed   By: Jorje Guild M.D.   On: 07/01/2013 06:56   Dg Abd 2 Views  07/01/2013   CLINICAL DATA:  Mid low back pain.  Nausea.  EXAM: ABDOMEN - 2 VIEW  COMPARISON:  03/16/2012  FINDINGS: Nonobstructive bowel gas pattern. No suspicious intra-abdominal mass effect or calcification. No pneumoperitoneum. Splenic artery atherosclerosis is again noted.  Diffuse degenerative disc and facet disease with degenerative lumbar levoscoliosis. Lung bases are clear.  IMPRESSION: Nonobstructive bowel gas pattern.   Electronically Signed   By: Jorje Guild M.D.   On: 07/01/2013 06:58     EKG Interpretation   Date/Time:  Friday July 01 2013 06:15:24 EDT Ventricular Rate:  73 PR Interval:  186 QRS Duration: 97 QT Interval:  402 QTC Calculation: 443 R Axis:   81 Text Interpretation:  Sinus rhythm Premature ventricular complexes Minimal  ST depression, v2-5 Confirmed by Wilson Singer  MD, STEPHEN ([id number]) on 07/01/2013  6:20:09 AM      MDM   Final diagnoses:  Acute renal failure  Anemia of chronic disease  Beta thalassemia trait  Cardiac arrest  CHF (congestive heart failure)  Chronic kidney disease, stage 47   80 year old male with past medical history of MI status post 3 stents and ICD presents with low back pain and left her pain similar to previous MI. EKG is shows minimal ST depression v2-v5. Also complains of constipation rectal exam shows minimal blood around the anus. Will rule out ACS and workup for constipation. Dr. Wilson Singer also evaluate the patient during this ED visit. BMP shows BUN 46, creatinine 2.19, acute kidney injury. Plan for admission for atypical chest pain, acute kidney injury, rectal bleeding, constipation. 0800 reevaluation patient resting comfortably in room. Discussed current workup and evaluation with the patient and daughter's. And need for admission to monitor her kidney function and further evaluation of atypical chest pain. The patient reports resolution of low back pain as well as left upper extremity discomfort. Discussed patient history, condition, with Dr. Tana Coast who agrees to admit the patient for further evaluation.     Lorrine Kin, PA-C 07/01/13 1530

## 2013-07-01 NOTE — Plan of Care (Signed)
Problem: Phase I Progression Outcomes Goal: Voiding-avoid urinary catheter unless indicated Outcome: Not Met (add Reason) Pt has to self cath at home prn. He has had to self cath here in the hospital as well.

## 2013-07-01 NOTE — ED Notes (Signed)
Interrogation machine does not seem to be working.  Paged Customer Service rep.  Awaiting a return call.

## 2013-07-01 NOTE — ED Notes (Signed)
Patient refused to let me cath him he stated he self cath at home made nurse aware and she said it would be ok with he did it himself.

## 2013-07-01 NOTE — Progress Notes (Signed)
NT assisted pt to the bathroom and patient and his family asked that she not be touching him to assist him. Pt would not allow NT to stay with him while he self catheterized himself in the bathroom. NT explained to pt about safety and his risk of falling, but pt did not want her in the bathroom or assisting him with ambulation. The pt's daughters asked that the NT just leave him alone or he would be angry. Pt and family educated on pt's fall risk and pt's wishes granted. NT did ensure pt got from the bathroom back to the bed safely. Will continue to monitor pt and to educate on safety. Kaylyn Layer

## 2013-07-01 NOTE — ED Notes (Addendum)
Spoke with Customer Service Rep and attempted several different things to troubleshoot.  Machine still not working correctly.  Rep is notifying an IT Rep to call me.

## 2013-07-01 NOTE — H&P (Signed)
             [facility]          History and Physical    PCP:   HUSAIN,KARRAR, MD   Chief Complaint:  Low back pain, left shoulder pain  HPI: Patient is a pleasant 80-year-old gentleman with past medical history significant for coronary artery disease and systolic congestive heart failure with an ejection fraction of 35% status post an AICD after he suffered sudden cardiac death, COPD, diabetes mellitus, neurogenic bladder that requires self-catheterization. He presents to the hospital today with a 2 day history of above mentioned complaints. His daughters became concerned because when he had his MI he complained of left shoulder pain. Patient denies any chest pain, any shortness of breath above his baseline. He was found to have a creatinine of 2.19 which is above his baseline of 1.4, urine that appears slightly dirty. Hospitalist admission has been requested.  Allergies:  No Known Allergies    Past Medical History  Diagnosis Date  . CAD (coronary artery disease)   . Ischemic cardiomyopathy   . CHF (congestive heart failure)   . Dyslipidemia   . Diverticulosis of colon (without mention of hemorrhage)   . Diabetes mellitus   . Anemia, unspecified   . Unspecified disorder resulting from impaired renal function   . Chronic airway obstruction, not elsewhere classified     on o2 at night  . Prostatic hypertrophy     hx of  . Retention of urine, unspecified   . ICD (implantable cardiac defibrillator) in place     st jude  . Emphysema   . Hemorrhoids   . MI, old 2010 or 2011  . Macular degeneration   . Cataracts, bilateral     Past Surgical History  Procedure Laterality Date  . Pci      4/11  . Cardiac defibrillator placement  2011    st jude  . Pacemaker insertion  2011  . Tonsillectomy  80 yo  . Cataract extraction      Prior to Admission medications   Medication Sig Start Date End Date Taking? Authorizing Provider  albuterol (PROVENTIL) (5 MG/ML) 0.5%  nebulizer solution Take 0.5 mLs (2.5 mg total) by nebulization every 4 (four) hours as needed for wheezing. And q3 hrs prn for SOB 08/26/12  Yes Tammy S Parrett, NP  ALPRAZolam (XANAX) 0.5 MG tablet Take 0.5 mg by mouth at bedtime as needed for sleep.    Yes Historical Provider, MD  arformoterol (BROVANA) 15 MCG/2ML NEBU Take 2 mLs (15 mcg total) by nebulization 2 (two) times daily. 08/31/12  Yes Tammy S Parrett, NP  aspirin EC 81 MG tablet Take 81 mg by mouth at bedtime.    Yes Historical Provider, MD  budesonide (PULMICORT) 0.5 MG/2ML nebulizer solution Take 2 mLs (0.5 mg total) by nebulization 2 (two) times daily. 08/31/12  Yes Tammy S Parrett, NP  clopidogrel (PLAVIX) 75 MG tablet Take 75 mg by mouth every morning.    Yes Historical Provider, MD  famotidine (PEPCID) 20 MG tablet Take 1 tablet by mouth at bedtime. 08/11/12  Yes Historical Provider, MD  losartan (COZAAR) 25 MG tablet Take 25 mg by mouth at bedtime.    Yes Historical Provider, MD  metoprolol tartrate (LOPRESSOR) 25 MG tablet Take 12.5 mg by mouth 2 (two) times daily.    Yes Historical Provider, MD  naproxen (NAPROSYN) 500 MG tablet Take 500 mg by mouth at bedtime as needed for mild   pain.    Yes Historical Provider, MD  nitroGLYCERIN (NITROSTAT) 0.4 MG SL tablet Place 1 tablet (0.4 mg total) under the tongue every 5 (five) minutes as needed for chest pain. 06/16/13 02/26/15 Yes James Allred, MD  rosuvastatin (CRESTOR) 5 MG tablet Take 5 mg by mouth every morning.   Yes Historical Provider, MD    Social History:  reports that he quit smoking about 4 years ago. His smoking use included Cigarettes. He smoked 0.00 packs per day. He has never used smokeless tobacco. He reports that he drinks alcohol. He reports that he does not use illicit drugs.  Family History  Problem Relation Age of Onset  . Lung cancer Father     smoker  . Coronary artery disease Mother   . Stroke Mother   . Breast cancer Sister   . Atrial fibrillation Daughter   .  Congestive Heart Failure Mother     Review of Systems:  Constitutional: Denies fever, chills, diaphoresis, appetite change and fatigue.  HEENT: Denies photophobia, eye pain, redness, hearing loss, ear pain, congestion, sore throat, rhinorrhea, sneezing, mouth sores, trouble swallowing, neck pain, neck stiffness and tinnitus.   Respiratory: Denies SOB, DOE, cough, chest tightness,  and wheezing.   Cardiovascular: Denies chest pain, palpitations and leg swelling.  Gastrointestinal: Denies nausea, vomiting, abdominal pain, diarrhea, constipation, blood in stool and abdominal distention.  Genitourinary: Denies dysuria, urgency, frequency, hematuria, flank pain and difficulty urinating.  Endocrine: Denies: hot or cold intolerance, sweats, changes in hair or nails, polyuria, polydipsia. Musculoskeletal: Denies myalgias, joint swelling, arthralgias and gait problem.  Skin: Denies pallor, rash and wound.  Neurological: Denies dizziness, seizures, syncope, weakness, light-headedness, numbness and headaches.  Hematological: Denies adenopathy. Easy bruising, personal or family bleeding history  Psychiatric/Behavioral: Denies suicidal ideation, mood changes, confusion, nervousness, sleep disturbance and agitation   Physical Exam: Blood pressure 143/57, pulse 70, temperature 98.3 F (36.8 C), temperature source Oral, resp. rate 20, height 6' (1.829 m), weight 83.3 kg (183 lb 10.3 oz), SpO2 94.00%. General: Alert, awake, oriented x3, HEENT: Normocephalic, atraumatic, pupils equal and reactive to light, extraocular movements intact, hard of hearing, moist mucous membranes. Neck: Supple, no JVD, no lymphadenopathy, no bruits, no goiter Cardiovascular: Regular rate and rhythm, no murmurs, rubs or gallops Lungs: Diffuse bilateral expiratory wheezing, no rhonchi. Abdomen: Obese, soft, nontender, nondistended, positive bowel sounds. Extremities: 1+ pitting edema bilaterally. Neurologic: Grossly intact and  nonfocal. I have not ambulated him.  Labs on Admission:  Results for orders placed during the hospital encounter of 07/01/13 (from the past 48 hour(s))  CBC WITH DIFFERENTIAL     Status: Abnormal   Collection Time    07/01/13  6:25 AM      Result Value Ref Range   WBC 8.4  4.0 - 10.5 K/uL   RBC 4.57  4.22 - 5.81 MIL/uL   Hemoglobin 9.8 (*) 13.0 - 17.0 g/dL   HCT 30.6 (*) 39.0 - 52.0 %   MCV 67.0 (*) 78.0 - 100.0 fL   MCH 21.4 (*) 26.0 - 34.0 pg   MCHC 32.0  30.0 - 36.0 g/dL   RDW 17.6 (*) 11.5 - 15.5 %   Platelets 104 (*) 150 - 400 K/uL   Comment: SPECIMEN CHECKED FOR CLOTS     REPEATED TO VERIFY     CONSISTENT WITH PREVIOUS RESULT   Neutrophils Relative % 81 (*) 43 - 77 %   Lymphocytes Relative 12  12 - 46 %   Monocytes Relative 5    3 - 12 %   Eosinophils Relative 2  0 - 5 %   Basophils Relative 0  0 - 1 %   Neutro Abs 6.8  1.7 - 7.7 K/uL   Lymphs Abs 1.0  0.7 - 4.0 K/uL   Monocytes Absolute 0.4  0.1 - 1.0 K/uL   Eosinophils Absolute 0.2  0.0 - 0.7 K/uL   Basophils Absolute 0.0  0.0 - 0.1 K/uL   RBC Morphology ELLIPTOCYTES     WBC Morphology DOHLE BODIES    BASIC METABOLIC PANEL     Status: Abnormal   Collection Time    07/01/13  6:25 AM      Result Value Ref Range   Sodium 141  137 - 147 mEq/L   Potassium 5.1  3.7 - 5.3 mEq/L   Chloride 102  96 - 112 mEq/L   CO2 23  19 - 32 mEq/L   Glucose, Bld 107 (*) 70 - 99 mg/dL   BUN 46 (*) 6 - 23 mg/dL   Creatinine, Ser 2.19 (*) 0.50 - 1.35 mg/dL   Calcium 9.3  8.4 - 10.5 mg/dL   GFR calc non Af Amer 26 (*) >90 mL/min   GFR calc Af Amer 30 (*) >90 mL/min   Comment: (NOTE)     The eGFR has been calculated using the CKD EPI equation.     This calculation has not been validated in all clinical situations.     eGFR's persistently <90 mL/min signify possible Chronic Kidney     Disease.  PRO B NATRIURETIC PEPTIDE     Status: None   Collection Time    07/01/13  6:25 AM      Result Value Ref Range   Pro B Natriuretic peptide  (BNP) 330.2  0 - 450 pg/mL  I-STAT TROPOININ, ED     Status: None   Collection Time    07/01/13  6:35 AM      Result Value Ref Range   Troponin i, poc 0.00  0.00 - 0.08 ng/mL   Comment 3            Comment: Due to the release kinetics of cTnI,     a negative result within the first hours     of the onset of symptoms does not rule out     myocardial infarction with certainty.     If myocardial infarction is still suspected,     repeat the test at appropriate intervals.  POC OCCULT BLOOD, ED     Status: Abnormal   Collection Time    07/01/13  6:38 AM      Result Value Ref Range   Fecal Occult Bld POSITIVE (*) NEGATIVE  URINALYSIS, ROUTINE W REFLEX MICROSCOPIC     Status: Abnormal   Collection Time    07/01/13  9:13 AM      Result Value Ref Range   Color, Urine YELLOW  YELLOW   APPearance CLEAR  CLEAR   Specific Gravity, Urine 1.018  1.005 - 1.030   pH 5.5  5.0 - 8.0   Glucose, UA NEGATIVE  NEGATIVE mg/dL   Hgb urine dipstick LARGE (*) NEGATIVE   Bilirubin Urine NEGATIVE  NEGATIVE   Ketones, ur NEGATIVE  NEGATIVE mg/dL   Protein, ur NEGATIVE  NEGATIVE mg/dL   Urobilinogen, UA 0.2  0.0 - 1.0 mg/dL   Nitrite NEGATIVE  NEGATIVE   Leukocytes, UA SMALL (*) NEGATIVE  URINE MICROSCOPIC-ADD ON     Status: Abnormal  Collection Time    07/01/13  9:13 AM      Result Value Ref Range   WBC, UA 3-6  <3 WBC/hpf   RBC / HPF 21-50  <3 RBC/hpf   Bacteria, UA FEW (*) RARE    Radiological Exams on Admission: Dg Chest 2 View  07/01/2013   CLINICAL DATA:  Chest pain  EXAM: CHEST  2 VIEW  COMPARISON:  05/27/2013  FINDINGS: Unchanged orientation of dual-chamber left approach pacer/ICD leads.  No cardiomegaly.  Stable elongation of the aorta.  Chronic pulmonary hyperinflation and interstitial coarsening. No superimposed consolidation, edema, effusion, or pneumothorax. Nipple shadows noted, also seen previously.  IMPRESSION: COPD.  No acute superimposed process.   Electronically Signed   By: Jorje Guild M.D.   On: 07/01/2013 06:56   Dg Abd 2 Views  07/01/2013   CLINICAL DATA:  Mid low back pain.  Nausea.  EXAM: ABDOMEN - 2 VIEW  COMPARISON:  03/16/2012  FINDINGS: Nonobstructive bowel gas pattern. No suspicious intra-abdominal mass effect or calcification. No pneumoperitoneum. Splenic artery atherosclerosis is again noted. Diffuse degenerative disc and facet disease with degenerative lumbar levoscoliosis. Lung bases are clear.  IMPRESSION: Nonobstructive bowel gas pattern.   Electronically Signed   By: Jorje Guild M.D.   On: 07/01/2013 06:58    Assessment/Plan Principal Problem:   Low back pain Active Problems:   DIABETES MELLITUS, TYPE II   CAD   Chronic kidney disease, stage 3   Beta thalassemia trait   Acute renal failure   UTI (urinary tract infection)   Chronic systolic CHF (congestive heart failure)    Right-sided lower back pain -He has a "knot" in the area of pain and makes a question whether he may have a muscular spasm. -Will try topical measures such as heating pads and a lidocaine patch. -Daughters are hesitant to use a muscle relaxer that may cause drowsiness and increased falls.  Acute on chronic kidney disease stage III -Baseline creatinine appears to be 1.4. -Creatinine is 2.19 on admission. -Suspect a component of prerenal azotemia given decreased by mouth intake. -Will start him on saline at 75 cc an hour but stop that after 12 hours to provide gentle hydration as to not overload him given his systolic CHF.  UTI -Continue Rocephin pending culture data.  Left shoulder pain -Patient thinks it is related to arthritis, however given this was the presenting symptom at time of his MI, will check an EKG and cycle his troponins. -If any abnormalities are noted on troponins and EKGs, cardiology should be consulted.  History of coronary artery disease -Known to have ischemic cardiomyopathy with an ejection fraction of around 30%, is also status post an AICD  after he suffered sudden cardiac death and require 3 stent placements. -He has recently seen cardiology, Dr. Johnsie Cancel, in the office.  COPD -He is certainly wheezing today, however patient and daughter state that he is always wheezing.  -Continue home regimen.  Diabetes mellitus type 2 -Check hemoglobin A1c. -Patient does not wish to do a sliding scale insulin.  DVT prophylaxis -Lovenox.  CODE STATUS -Full code  Disposition  -Patient's primary care physician is Dr. Lysle Rubens, I have left a voicemail for him to see patient in the morning. -Patient's daughters are insistent that he leave the hospital tomorrow as they're planning a big birthday party for some family members.  Time Spent on Admission: 75 minutes  Estela Leonie Green [facility] Pager: [phone] 07/01/2013, 12:22 PM

## 2013-07-02 ENCOUNTER — Inpatient Hospital Stay (HOSPITAL_COMMUNITY): Payer: Medicare Other

## 2013-07-02 LAB — BASIC METABOLIC PANEL
BUN: 44 mg/dL — ABNORMAL HIGH (ref 6–23)
CALCIUM: 8.5 mg/dL (ref 8.4–10.5)
CO2: 26 mEq/L (ref 19–32)
Chloride: 105 mEq/L (ref 96–112)
Creatinine, Ser: 2.3 mg/dL — ABNORMAL HIGH (ref 0.50–1.35)
GFR, EST AFRICAN AMERICAN: 28 mL/min — AB (ref >90)
GFR, EST NON AFRICAN AMERICAN: 24 mL/min — AB (ref >90)
Glucose, Bld: 86 mg/dL (ref 70–99)
POTASSIUM: 5 meq/L (ref 3.7–5.3)
Sodium: 141 mEq/L (ref 137–147)

## 2013-07-02 LAB — CBC
HCT: 26.3 % — ABNORMAL LOW (ref 39.0–52.0)
HEMOGLOBIN: 8.4 g/dL — AB (ref 13.0–17.0)
MCH: 21.6 pg — ABNORMAL LOW (ref 26.0–34.0)
MCHC: 31.9 g/dL (ref 30.0–36.0)
MCV: 67.8 fL — ABNORMAL LOW (ref 78.0–100.0)
Platelets: 95 10*3/uL — ABNORMAL LOW (ref 150–400)
RBC: 3.88 MIL/uL — AB (ref 4.22–5.81)
RDW: 17.4 % — ABNORMAL HIGH (ref 11.5–15.5)
WBC: 7.2 10*3/uL (ref 4.0–10.5)

## 2013-07-02 LAB — URINE CULTURE
COLONY COUNT: NO GROWTH
CULTURE: NO GROWTH

## 2013-07-02 LAB — TROPONIN I

## 2013-07-02 MED ORDER — SODIUM CHLORIDE 0.9 % IV SOLN
INTRAVENOUS | Status: DC
  Administered 2013-07-02 – 2013-07-03 (×2): via INTRAVENOUS

## 2013-07-02 NOTE — Progress Notes (Signed)
Subjective: Back pain better, feels ok  Objective: Vital signs in last 24 hours: Temp:  [98.2 F (36.8 C)-98.5 F (36.9 C)] 98.2 F (36.8 C) (06/06 0535) Pulse Rate:  [67-72] 69 (06/06 0535) Resp:  [18-22] 18 (06/06 0535) BP: (122-132)/(59-63) 125/63 mmHg (06/06 0535) SpO2:  [93 %-98 %] 98 % (06/06 0535) Weight change:  Last BM Date: 06/29/13  Intake/Output from previous day: 06/05 0701 - 06/06 0700 In: 170 [P.O.:120; IV Piggyback:50] Out: 300 [Urine:300] Intake/Output this shift:    General appearance: alert and cooperative Resp: wheezes few wheezes Cardio: regular rate and rhythm, S1, S2 normal, no murmur, click, rub or gallop GI: soft, non-tender; bowel sounds normal; no masses,  no organomegaly Extremities: extremities normal, atraumatic, no cyanosis or edema  Lab Results:  Recent Labs  07/01/13 1220 07/02/13 0428  WBC 7.5 7.2  HGB 9.4* 8.4*  HCT 29.1* 26.3*  PLT 91* 95*   BMET  Recent Labs  07/01/13 0625 07/01/13 1220 07/02/13 0428  NA 141  --  141  K 5.1  --  5.0  CL 102  --  105  CO2 23  --  26  GLUCOSE 107*  --  86  BUN 46*  --  44*  CREATININE 2.19* 2.03* 2.30*  CALCIUM 9.3  --  8.5    Studies/Results: Dg Chest 2 View  07/01/2013   CLINICAL DATA:  Chest pain  EXAM: CHEST  2 VIEW  COMPARISON:  05/27/2013  FINDINGS: Unchanged orientation of dual-chamber left approach pacer/ICD leads.  No cardiomegaly.  Stable elongation of the aorta.  Chronic pulmonary hyperinflation and interstitial coarsening. No superimposed consolidation, edema, effusion, or pneumothorax. Nipple shadows noted, also seen previously.  IMPRESSION: COPD.  No acute superimposed process.   Electronically Signed   By: Jorje Guild M.D.   On: 07/01/2013 06:56   Dg Abd 2 Views  07/01/2013   CLINICAL DATA:  Mid low back pain.  Nausea.  EXAM: ABDOMEN - 2 VIEW  COMPARISON:  03/16/2012  FINDINGS: Nonobstructive bowel gas pattern. No suspicious intra-abdominal mass effect or calcification.  No pneumoperitoneum. Splenic artery atherosclerosis is again noted. Diffuse degenerative disc and facet disease with degenerative lumbar levoscoliosis. Lung bases are clear.  IMPRESSION: Nonobstructive bowel gas pattern.   Electronically Signed   By: Jorje Guild M.D.   On: 07/01/2013 06:58    Medications: I have reviewed the patient's current medications.  Assessment/Plan: Right-sided Mid/low back pain   could be muscular but r/o renal cause given rise in creatinine  Acute on chronic kidney disease stage III  -Baseline creatinine appears to be 1.3. Continue saline and check renal U/S. Hold ARB/NSAIDS -UTI  -may just be colonization in patient who self catheterizes, await culture  Left shoulder pain  -improved History of coronary artery disease  -Known to have ischemic cardiomyopathy with an ejection fraction of around 30%, is also status post an AICD after he suffered sudden cardiac death and require 3 stent placements.  -He has recently seen cardiology, Dr. Johnsie Cancel, in the office.  COPD  - at baseline  Diabetes mellitus type 2   controlled Constipation, requests enema  DVT prophylaxis  -Lovenox.  CODE STATUS  -Full code  Disposition possible discharge tomorrow    LOS: 1 day   Irven Shelling 07/02/2013, 11:47 AM

## 2013-07-02 NOTE — Progress Notes (Signed)
Pt had a large BM. RN did not see because pt flushed, but per pt it was large, with formed and loose stool. Pt reported that he felt some relief in his abdomen from bloating/full feeling. Abdomen still distended, but feels slightly less firm than earlier today.  Kaylyn Layer

## 2013-07-02 NOTE — Progress Notes (Signed)
Tap water enema, 1000 ml given to pt per MD order. Given to pt 500 ml at a time. Pt tolerated procedure well, but only had minimal results. He was able to pass a quarter size and a dime sized round, formed stool. Pt was checked manually for impaction in his rectum, but none was found. Pt comfortable at the end of procedure and we will continue to help him have a bowel movement.  Kaylyn Layer 07/02/2013

## 2013-07-03 ENCOUNTER — Inpatient Hospital Stay (HOSPITAL_COMMUNITY): Payer: Medicare Other

## 2013-07-03 LAB — URINE CULTURE
COLONY COUNT: NO GROWTH
Culture: NO GROWTH

## 2013-07-03 LAB — BASIC METABOLIC PANEL
BUN: 47 mg/dL — ABNORMAL HIGH (ref 6–23)
CO2: 23 meq/L (ref 19–32)
Calcium: 8.4 mg/dL (ref 8.4–10.5)
Chloride: 104 mEq/L (ref 96–112)
Creatinine, Ser: 2.38 mg/dL — ABNORMAL HIGH (ref 0.50–1.35)
GFR calc Af Amer: 27 mL/min — ABNORMAL LOW (ref >90)
GFR, EST NON AFRICAN AMERICAN: 23 mL/min — AB (ref >90)
Glucose, Bld: 85 mg/dL (ref 70–99)
POTASSIUM: 4.5 meq/L (ref 3.7–5.3)
Sodium: 141 mEq/L (ref 137–147)

## 2013-07-03 LAB — CBC
HCT: 26.1 % — ABNORMAL LOW (ref 39.0–52.0)
HEMOGLOBIN: 8.4 g/dL — AB (ref 13.0–17.0)
MCH: 21.9 pg — AB (ref 26.0–34.0)
MCHC: 32.2 g/dL (ref 30.0–36.0)
MCV: 68.1 fL — ABNORMAL LOW (ref 78.0–100.0)
PLATELETS: 93 10*3/uL — AB (ref 150–400)
RBC: 3.83 MIL/uL — AB (ref 4.22–5.81)
RDW: 17.3 % — ABNORMAL HIGH (ref 11.5–15.5)
WBC: 9.1 10*3/uL (ref 4.0–10.5)

## 2013-07-03 MED ORDER — TAMSULOSIN HCL 0.4 MG PO CAPS
0.4000 mg | ORAL_CAPSULE | Freq: Every day | ORAL | Status: DC
  Administered 2013-07-03 – 2013-07-04 (×2): 0.4 mg via ORAL
  Filled 2013-07-03 (×2): qty 1

## 2013-07-03 MED ORDER — ACETAMINOPHEN-CODEINE #3 300-30 MG PO TABS
2.0000 | ORAL_TABLET | Freq: Four times a day (QID) | ORAL | Status: DC | PRN
  Administered 2013-07-03 – 2013-07-04 (×2): 2 via ORAL
  Filled 2013-07-03 (×2): qty 2

## 2013-07-03 MED ORDER — TAMSULOSIN HCL 0.4 MG PO CAPS: 0.4000 mg | ORAL_CAPSULE | Freq: Every day | ORAL | Status: DC

## 2013-07-03 MED ORDER — ALPRAZOLAM 0.5 MG PO TABS
0.5000 mg | ORAL_TABLET | Freq: Every day | ORAL | Status: DC
  Administered 2013-07-03: 0.5 mg via ORAL
  Filled 2013-07-03: qty 1

## 2013-07-03 NOTE — Progress Notes (Signed)
Subjective: Had about 1 hour R flank pain last night  Objective: Vital signs in last 24 hours: Temp:  [97.6 F (36.4 C)-98.4 F (36.9 C)] 97.6 F (36.4 C) (06/07 1000) Pulse Rate:  [63-73] 71 (06/07 1000) Resp:  [17-20] 20 (06/07 1000) BP: (120-145)/(59-77) 135/59 mmHg (06/07 1000) SpO2:  [95 %-98 %] 97 % (06/07 1000) Weight change:  Last BM Date: 07/02/13  Intake/Output from previous day: 06/06 0701 - 06/07 0700 In: 1736.3 [P.O.:360; I.V.:1376.3] Out: 500 [Urine:500] Intake/Output this shift: Total I/O In: -  Out: 200 [Urine:200]  General appearance: alert and cooperative Resp: decreased BS bilaterally Cardio: regular rate and rhythm, S1, S2 normal, no murmur, click, rub or gallop GI: soft, non-tender; bowel sounds normal; no masses,  no organomegaly Extremities: extremities normal, atraumatic, no cyanosis or edema  Lab Results:  Recent Labs  07/02/13 0428 07/03/13 0419  WBC 7.2 9.1  HGB 8.4* 8.4*  HCT 26.3* 26.1*  PLT 95* 93*   BMET  Recent Labs  07/02/13 0428 07/03/13 0419  NA 141 141  K 5.0 4.5  CL 105 104  CO2 26 23  GLUCOSE 86 85  BUN 44* 47*  CREATININE 2.30* 2.38*  CALCIUM 8.5 8.4    Studies/Results: US Renal  07/03/2013   CLINICAL DATA:  Right-sided flank.  Acute renal failure.  EXAM: RENAL/URINARY TRACT ULTRASOUND COMPLETE  COMPARISON:  Abdominal ultrasound 05/13/2010.  FINDINGS: Right Kidney:  Length: 14.0 cm. Mild right-sided hydronephrosis. Echogenicity within normal limits. Multiple right-sided renal lesions, the majority of which are too small to characterize. The lesions larger than a centimeter are anechoic with increased through transmission, compatible with simple cysts, largest of which measures up to 2.9 x 3.2 x 3.1 cm. The exception to this is one lesion in the lower pole that has a thick internal septation, which measures 1.9 x 1.7 x 1.3 cm.  Left Kidney:  Length: 14.6 cm. Echogenicity within normal limits. No hydronephrosis  visualized. Multiple anechoic lesions with increased through transmission, compatible simple cysts, largest of which measures 4.2 x 4.8 x 3.9 cm.  Bladder:  Appears normal for degree of bladder distention. A left ureteral jet is noted. The right ureteral jet is not visualized.  IMPRESSION: 1. Mild right-sided hydronephrosis. The right ureteral jet is not visualized. If there is clinical concern for obstructive uropathy, further evaluation with dedicated noncontrast CT of the abdomen and pelvis is recommended. 2. Multiple renal lesions bilaterally. The majority of these appear to represent simple cysts, however, there is a complex cyst with thick internal septation or mural nodule in the lower pole of the right kidney (Bosniak class 3). Further evaluation with nonemergent MRI of the abdomen with and without IV gadolinium is suggested to exclude malignancy.   Electronically Signed   By: Vinnie Langton M.D.   On: 07/03/2013 10:44    Medications: I have reviewed the patient's current medications.  Assessment/Plan: Right-sided Mid/low back pain  Suspect renal source as U/S shows mild R hydronephrosis, will ask urology to see and obtain ct abdomen stone protocol Acute on chronic kidney disease stage III  -Baseline creatinine appears to be 1.3. No improvement with saline, D/C/ Hold ARB/NSAIDS  -UTI  -culture negative, stop antibiotics Left shoulder pain  -improved  History of coronary artery disease  -Known to have ischemic cardiomyopathy with an ejection fraction of around 30%, is also status post an AICD after he suffered sudden cardiac death and require 3 stent placements.  -He has recently seen cardiology, Dr. Johnsie Cancel,  in the office.  COPD  - at baseline  Diabetes mellitus type 2  - diet controlled  Constipation, enema yesterday   DVT prophylaxis  -Lovenox.  CODE STATUS  -Full code  Disposition hope to discharge home with home health    LOS: 2 days   Irven Shelling 07/03/2013, 11:09  AM

## 2013-07-03 NOTE — Consult Note (Signed)
I have been asked to see the patient by Dr. Lavone Orn, M.D., for evaluation and management of right flank pain.  History of present illness: The patient states that he has had intermittent right flank pain for several months now.  The pain is worse in the morning when he wakes up at resolves throughout the day.  However, approximately 4 days prior he developed acute onset right flank pain in the middle of the night, and was unable to sleep at night.  He continued to have intermittent right flank pain worse than prior months.  The pain is mainly located in the right flank and radiates across patient's right side down to the groin.  He denies any gross hematuria, he is on CIC for chronic prostate obstruction.  He denies any  Fever/chills.  He has been placed on antibiotics for a presumed UTI.  The patient underwent a KUB on admission which was unremarkable/unrevealing.  He subsequently had a renal ultrasound which showed mild to moderate right-sided hydronephrosis.  I was then consult it and recommended the patient undergo a stone protocol CT scan.  The CT scan did reveal 2 small stones located in the mid right ureter with associated proximal hydronephrosis.  The largest/lead fragment was 3 mm x 4 mm.Further, the patient does have a slightly elevated creatinine 2.38 from a baseline of 1.3.  The patient's comorbidities include Congestive heart failure, COPD, cardiomyopathy and CAD and diabetes.  Review of systems: A 12 point comprehensive review of systems was obtained and is negative unless otherwise stated in the history of present illness.  Patient Active Problem List   Diagnosis Date Noted  . Acute renal failure 07/01/2013  . Low back pain 07/01/2013  . UTI (urinary tract infection) 07/01/2013  . Chronic systolic CHF (congestive heart failure) 07/01/2013  . Dysphagia, unspecified(787.20) 10/27/2012  . Acute on chronic renal insufficiency 03/12/2012  . MGUS (monoclonal gammopathy of unknown  significance) 03/12/2012  . Thrombocytopenia 03/12/2012  . Beta thalassemia trait 03/12/2012  . UTI (lower urinary tract infection) 03/12/2012  . Gait instability 03/12/2012  . Fall at home 03/12/2012  . ICD St.Jude 12/09/2011  . Ischemic cardiomyopathy 04/14/2011  . Cyanocobalamin deficiency 12/19/2010  . CHF (congestive heart failure) 10/09/2010  . Edema 07/08/2010  . Dyspnea 07/08/2010  . CARDIAC ARREST 04/04/2010  . CONDUCTIVE HEARING LOSS OF COMBINED TYPES 10/24/2009  . KNEE PAIN 08/07/2009  . MI 06/07/2009  . DIABETES MELLITUS, TYPE II 06/06/2009  . DYSLIPIDEMIA 06/06/2009  . Anemia of chronic disease 06/06/2009  . CAD 06/06/2009  . Asthma vs vcd 06/06/2009  . DIVERTICULAR DISEASE 06/06/2009  . Chronic kidney disease, stage 3 06/06/2009  . URINARY RETENTION 06/06/2009  . BENIGN PROSTATIC HYPERTROPHY, HX OF 06/06/2009    No current facility-administered medications on file prior to encounter.   Current Outpatient Prescriptions on File Prior to Encounter  Medication Sig Dispense Refill  . albuterol (PROVENTIL) (5 MG/ML) 0.5% nebulizer solution Take 0.5 mLs (2.5 mg total) by nebulization every 4 (four) hours as needed for wheezing. And q3 hrs prn for SOB  20 mL  3  . ALPRAZolam (XANAX) 0.5 MG tablet Take 0.5 mg by mouth at bedtime as needed for sleep.       Marland Kitchen arformoterol (BROVANA) 15 MCG/2ML NEBU Take 2 mLs (15 mcg total) by nebulization 2 (two) times daily.  120 mL  3  . aspirin EC 81 MG tablet Take 81 mg by mouth at bedtime.       . budesonide (  PULMICORT) 0.5 MG/2ML nebulizer solution Take 2 mLs (0.5 mg total) by nebulization 2 (two) times daily.  120 mL  3  . famotidine (PEPCID) 20 MG tablet Take 1 tablet by mouth at bedtime.      . metoprolol tartrate (LOPRESSOR) 25 MG tablet Take 12.5 mg by mouth 2 (two) times daily.       . naproxen (NAPROSYN) 500 MG tablet Take 500 mg by mouth at bedtime as needed for mild pain.       . nitroGLYCERIN (NITROSTAT) 0.4 MG SL tablet  Place 1 tablet (0.4 mg total) under the tongue every 5 (five) minutes as needed for chest pain.  25 tablet  4    Past Medical History  Diagnosis Date  . CAD (coronary artery disease)   . Ischemic cardiomyopathy   . CHF (congestive heart failure)   . Dyslipidemia   . Diverticulosis of colon (without mention of hemorrhage)   . Diabetes mellitus   . Anemia, unspecified   . Unspecified disorder resulting from impaired renal function   . Chronic airway obstruction, not elsewhere classified     on o2 at night  . Prostatic hypertrophy     hx of  . Retention of urine, unspecified   . ICD (implantable cardiac defibrillator) in place     st jude  . Emphysema   . Hemorrhoids   . MI, old 2010 or 2011  . Macular degeneration   . Cataracts, bilateral     Past Surgical History  Procedure Laterality Date  . Pci      4/11  . Cardiac defibrillator placement  2011    st jude  . Pacemaker insertion  2011  . Tonsillectomy  80 yo  . Cataract extraction      History  Substance Use Topics  . Smoking status: Former Smoker    Types: Cigarettes    Quit date: 04/27/2009  . Smokeless tobacco: Never Used  . Alcohol Use: Yes     Comment: occasional beer    Family History  Problem Relation Age of Onset  . Lung cancer Father     smoker  . Coronary artery disease Mother   . Stroke Mother   . Breast cancer Sister   . Atrial fibrillation Daughter   . Congestive Heart Failure Mother     PE: Filed Vitals:   07/03/13 0526 07/03/13 0731 07/03/13 1000 07/03/13 1345  BP: 145/77  135/59 140/53  Pulse: 63  71 69  Temp: 97.9 F (36.6 C)  97.6 F (36.4 C) 98.1 F (36.7 C)  TempSrc: Oral  Oral Oral  Resp: 20  20 21   Height:      Weight:      SpO2: 98% 98% 97% 98%   Patient appears to be in no acute distress  patient is alert and oriented x3 Atraumatic normocephalic head No cervical or supraclavicular lymphadenopathy appreciated No increased work of breathing, Lungs sounds are distant,  with wheezing Regular sinus rhythm/rate Abdomen is soft, nontender, nondistended, Patient has right CVA tenderness Lower extremities are symmetric without appreciable edema Grossly neurologically intact No identifiable skin lesions   Recent Labs  07/01/13 1220 07/02/13 0428 07/03/13 0419  WBC 7.5 7.2 9.1  HGB 9.4* 8.4* 8.4*  HCT 29.1* 26.3* 26.1*    Recent Labs  07/01/13 0625 07/01/13 1220 07/02/13 0428 07/03/13 0419  NA 141  --  141 141  K 5.1  --  5.0 4.5  CL 102  --  105 104  CO2  23  --  26 23  GLUCOSE 107*  --  86 85  BUN 46*  --  44* 47*  CREATININE 2.19* 2.03* 2.30* 2.38*  CALCIUM 9.3  --  8.5 8.4   No results found for this basename: LABPT, INR,  in the last 72 hours No results found for this basename: LABURIN,  in the last 72 hours Results for orders placed during the hospital encounter of 07/01/13  URINE CULTURE     Status: None   Collection Time    07/01/13  6:11 AM      Result Value Ref Range Status   Specimen Description URINE, CATHETERIZED   Final   Special Requests NONE   Final   Culture  Setup Time     Final   Value: 07/01/2013 18:22     Performed at [place]     Final   Value: NO GROWTH     Performed at [facility]   Culture     Final   Value: NO GROWTH     Performed at [facility]   Report Status 07/02/2013 FINAL   Final  URINE CULTURE     Status: None   Collection Time    07/01/13  9:13 AM      Result Value Ref Range Status   Specimen Description URINE, CATHETERIZED   Final   Special Requests NONE   Final   Culture  Setup Time     Final   Value: 07/02/2013 01:48     Performed at [place]     Final   Value: NO GROWTH     Performed at [facility]   Culture     Final   Value: NO GROWTH     Performed at [facility]   Report Status 07/03/2013 FINAL   Final    Imaging: CT abdomen/pelvis: There is a 4 mm x 3 mm mid ureteral stone with a smaller  stone more proximal to this and proximal hydro-ureteronephrosis mild to moderate in severity.  The patient also has a stone in his right renal pelvis.  He has cysts bilaterally in his kidneys.  Imp: Right obstructing mid ureteral stone.  The patient has no evidence of infection.  He does have worsening renal function, I'm not sure this is related to some of the medications that he is receiving the hospital or from the obstructing stone.  Given that his other kidney looks to be reasonable in size would not expect him to have a rise in his creatinine from this obstruction.  Recommendations: Given the patient's comorbidities and the size of the stone, together with the patient and his daughter, we have decided to follow this patient closely and place him on maximum medical expose and therapy.  The patient for 2 and initially was scheduled to see Dr.  Gaynelle Arabian on Tuesday.  We will follow up at that point to assess hm and whether he has well-controlled pain and his renal function is not worsening. I started the patient on Flomax.  Would recommend treating his pain with Tylenol with codeine if necessary.   Ardis Hughs

## 2013-07-03 NOTE — Progress Notes (Signed)
CARE MANAGEMENT NOTE 07/03/2013  Patient:  [name] [name]   Account Number:  [id number]  Date Initiated:  07/02/2013  Documentation initiated by:  [facility]  Subjective/Objective Assessment:   CHF, kidney disease     Action/Plan:   waiting PT/OT evaluation   Anticipated DC Date:  07/04/2013   Anticipated DC Plan:  [place]  CM consult      [facility] Choice  HOME HEALTH   Choice offered to / List presented to:  C-1 Patient   DME arranged  [place]      DME agency  [place].        Status of service:  In process, will continue to follow Medicare Important Message given?  YES (If response is "NO", the following Medicare IM given date fields will be blank) Date Medicare IM given:  07/01/2013 Date Additional Medicare IM given:    Discharge Disposition:  [place]  Per UR Regulation:    If discussed at Long Length of Stay Meetings, dates discussed:    Comments:  07/03/2013 1530 Notified [place] for light-weight wc and for [facility]. Waiting orders for physician for [facility]. Jonnie Finner RN CCM Case Mgmt phone [phone]  07/03/2013 10:05 AM  NCM spoke to pt and offered choice for [facility]. Gave permission to speak to dtr, [name] [name]. Pt states he had [facility] in the past. He had therapy at SNF and when he was dc home from SNF. Pt states a light-weight wheelchair will benefit him at home. Dtr states they have aide that comes 4 hours a day to help with meals, and light housekeeping. Jonnie Finner RN CCM Case Mgmt phone [phone]

## 2013-07-03 NOTE — Progress Notes (Signed)
NCM spoke to [name] [name] and offered choice for [facility]. Gave permission to speak to dtr, [name]. [name] [name] states he had [facility] in the past. He had therapy at SNF and when he was dc home from SNF. [name] [name] states a light-weight wheelchair will benefit him at home. Dtr states they have aide that comes 4 hours a day to help with meals, and light housekeeping. Jonnie Finner RN CCM Case Mgmt phone [phone]

## 2013-07-04 LAB — BASIC METABOLIC PANEL
BUN: 43 mg/dL — ABNORMAL HIGH (ref 6–23)
CALCIUM: 8.6 mg/dL (ref 8.4–10.5)
CHLORIDE: 106 meq/L (ref 96–112)
CO2: 25 meq/L (ref 19–32)
Creatinine, Ser: 2.22 mg/dL — ABNORMAL HIGH (ref 0.50–1.35)
GFR calc Af Amer: 29 mL/min — ABNORMAL LOW (ref >90)
GFR calc non Af Amer: 25 mL/min — ABNORMAL LOW (ref >90)
GLUCOSE: 84 mg/dL (ref 70–99)
Potassium: 5.1 mEq/L (ref 3.7–5.3)
SODIUM: 142 meq/L (ref 137–147)

## 2013-07-04 MED ORDER — SENNA 8.6 MG PO TABS: 1.0000 | ORAL_TABLET | Freq: Every day | ORAL | Status: DC

## 2013-07-04 MED ORDER — ACETAMINOPHEN-CODEINE #3 300-30 MG PO TABS: 1.0000 | ORAL_TABLET | Freq: Four times a day (QID) | ORAL | Status: DC | PRN

## 2013-07-04 MED ORDER — TAMSULOSIN HCL 0.4 MG PO CAPS: 0.4000 mg | ORAL_CAPSULE | Freq: Every day | ORAL | Status: DC

## 2013-07-04 NOTE — Discharge Summary (Signed)
Physician Discharge Summary  Patient ID: [name] [name] MRN: [id number] DOB/AGE: December 31, 1925 80 y.o.  Admit date: 07/01/2013 Discharge date: 07/04/2013  Admission Diagnoses:  Discharge Diagnoses:  Principal Problem:   Low back pain Right hydronephrosis Obstructive  kidney sone Acute on chronic renal failure Active Problems:   DIABETES MELLITUS, TYPE II   CAD   Chronic kidney disease, stage 3   Beta thalassemia trait   Acute renal failure   Chronic systolic CHF (congestive heart failure)   Discharged Condition: good  Hospital Course: 80 year old male admitted with acute renal failure, back pain; underlying history of COPD, BPH with Self-Cath. Problem #1: Patient's creatinine was 2.38 with baseline creatinine of 1.3- initially had some IV fluids without much improvement in his renal function, renal ultrasound showed right hydronephrosis. Patient was on naproxen and Cozaar which was discontinued. His creatinine was 2.2. Hemodynamically stable; probable combination of medication effect and some obstruction on the right side of the kidney stone. Problem #2: Right back pain and flank pain right hydronephrosis CT of the abdomen obtained showed obstructive kidney stone 4-6 mm in the ureter urology was consulted because of his underlying other medical problems they think they want to give time with medical management to see if he passes a stone, Flomax was added. Patient's pain controlled with Tylenol with codeine. Normal white count. Initial urinalysis had some white cells he was started on antibiotics, cultures came back negative antibiotic discontinued no evidence of UTI. Patient will follow up with urology Dr. Gaynelle Arabian tomorrow. Pain control and fluids. Problem #3: COPD continue current regiment stable Problem #4: History of CAD and CHF continue current medication we'll hold off Cozaar because of the worsening renal function. Blood pressure was stable Problem #5: Constipation Senokot at  bedtime. Problem 6: BPH with obstruction patient self caths at home.  Consults: urology  Significant Diagnostic Studies: labs:Normal WBC count, creatinine 2.2, normal potassium, hemoglobin stable at 8.9. UA was white cells cultures negative microbiology: urine culture: negative and radiology: CT scan: right hydronephrosis; kidney stone with obstruction and Ultrasound: right  hydonephrosis  Treatments: IV hydration and antibiotics: ceftriaxone  Discharge Exam: Blood pressure 124/56, pulse 74, temperature 97.8 F (36.6 C), temperature source Oral, resp. rate 22, height 6' (1.829 m), weight 83.3 kg (183 lb 10.3 oz), SpO2 97.00%. General appearance: alert Resp:bilaeral Wheezing Cardio: regular rate and rhythm GI: soft, non-tender; bowel sounds normal; no masses,  no organomegaly  Disposition: 01-Home or Self Care  Discharge Instructions   Diet - low sodium heart healthy    Complete by:  As directed      Discharge instructions    Complete by:  As directed   F/u with Urology 07/05/13- dr Gaynelle Arabian     Increase activity slowly    Complete by:  As directed             Medication List    STOP taking these medications       losartan 25 MG tablet  Commonly known as:  COZAAR     naproxen 500 MG tablet  Commonly known as:  NAPROSYN      TAKE these medications       acetaminophen-codeine 300-30 MG per tablet  Commonly known as:  TYLENOL #3  Take 1-2 tablets by mouth every 6 (six) hours as needed for moderate pain.     albuterol (5 MG/ML) 0.5% nebulizer solution  Commonly known as:  PROVENTIL  Take 0.5 mLs (2.5 mg total) by nebulization every 4 (four) hours as needed  for wheezing. And q3 hrs prn for SOB     ALPRAZolam 0.5 MG tablet  Commonly known as:  XANAX  Take 0.5 mg by mouth at bedtime as needed for sleep.     arformoterol 15 MCG/2ML Nebu  Commonly known as:  BROVANA  Take 2 mLs (15 mcg total) by nebulization 2 (two) times daily.     aspirin EC 81 MG tablet  Take 81  mg by mouth at bedtime.     budesonide 0.5 MG/2ML nebulizer solution  Commonly known as:  PULMICORT  Take 2 mLs (0.5 mg total) by nebulization 2 (two) times daily.     clopidogrel 75 MG tablet  Commonly known as:  PLAVIX  Take 75 mg by mouth every morning.     famotidine 20 MG tablet  Commonly known as:  PEPCID  Take 1 tablet by mouth at bedtime.     metoprolol tartrate 25 MG tablet  Commonly known as:  LOPRESSOR  Take 12.5 mg by mouth 2 (two) times daily.     nitroGLYCERIN 0.4 MG SL tablet  Commonly known as:  NITROSTAT  Place 1 tablet (0.4 mg total) under the tongue every 5 (five) minutes as needed for chest pain.     rosuvastatin 5 MG tablet  Commonly known as:  CRESTOR  Take 5 mg by mouth every morning.     senna 8.6 MG Tabs tablet  Commonly known as:  SENOKOT  Take 1-2 tablets (8.6-17.2 mg total) by mouth at bedtime.     tamsulosin 0.4 MG Caps capsule  Commonly known as:  FLOMAX  Take 1 capsule (0.4 mg total) by mouth daily.           Follow-up Information   Follow up with [name], MD In 1 week.   Specialty:  Internal Medicine   Contact information:   [address], [address] [place] [address] [phone]       Follow up with Carolan Clines I, MD. Schedule an appointment as soon as possible for a visit in 1 day. (Call today to make an appointment for tomorrow)    Specialty:  Urology   Contact information:   [place] [address] [phone]      Discharge planning 40 min.total Signed: [name] 07/04/2013, 7:58 AM

## 2013-07-05 DIAGNOSIS — R310 Gross hematuria: Secondary | ICD-10-CM | POA: Diagnosis not present

## 2013-07-05 DIAGNOSIS — N201 Calculus of ureter: Secondary | ICD-10-CM | POA: Diagnosis not present

## 2013-07-06 DIAGNOSIS — J441 Chronic obstructive pulmonary disease with (acute) exacerbation: Secondary | ICD-10-CM | POA: Diagnosis not present

## 2013-07-06 DIAGNOSIS — N183 Chronic kidney disease, stage 3 unspecified: Secondary | ICD-10-CM | POA: Diagnosis not present

## 2013-07-06 DIAGNOSIS — E1129 Type 2 diabetes mellitus with other diabetic kidney complication: Secondary | ICD-10-CM | POA: Diagnosis not present

## 2013-07-06 DIAGNOSIS — I509 Heart failure, unspecified: Secondary | ICD-10-CM | POA: Diagnosis not present

## 2013-07-06 DIAGNOSIS — I5022 Chronic systolic (congestive) heart failure: Secondary | ICD-10-CM | POA: Diagnosis not present

## 2013-07-06 DIAGNOSIS — Z9981 Dependence on supplemental oxygen: Secondary | ICD-10-CM | POA: Diagnosis not present

## 2013-07-06 DIAGNOSIS — D563 Thalassemia minor: Secondary | ICD-10-CM | POA: Diagnosis not present

## 2013-07-06 DIAGNOSIS — N319 Neuromuscular dysfunction of bladder, unspecified: Secondary | ICD-10-CM | POA: Diagnosis not present

## 2013-07-07 ENCOUNTER — Other Ambulatory Visit: Payer: Self-pay

## 2013-07-07 DIAGNOSIS — D563 Thalassemia minor: Secondary | ICD-10-CM | POA: Diagnosis not present

## 2013-07-07 DIAGNOSIS — I5022 Chronic systolic (congestive) heart failure: Secondary | ICD-10-CM | POA: Diagnosis not present

## 2013-07-07 DIAGNOSIS — E1129 Type 2 diabetes mellitus with other diabetic kidney complication: Secondary | ICD-10-CM | POA: Diagnosis not present

## 2013-07-07 DIAGNOSIS — N183 Chronic kidney disease, stage 3 unspecified: Secondary | ICD-10-CM | POA: Diagnosis not present

## 2013-07-07 DIAGNOSIS — I509 Heart failure, unspecified: Secondary | ICD-10-CM | POA: Diagnosis not present

## 2013-07-07 DIAGNOSIS — N319 Neuromuscular dysfunction of bladder, unspecified: Secondary | ICD-10-CM | POA: Diagnosis not present

## 2013-07-07 MED ORDER — ROSUVASTATIN CALCIUM 5 MG PO TABS: 5.0000 mg | ORAL_TABLET | ORAL | Status: DC

## 2013-07-09 NOTE — ED Provider Notes (Signed)
Medical screening examination/treatment/procedure(s) were conducted as a shared visit with non-physician practitioner(s) and myself.  I personally evaluated the patient during the encounter.   EKG Interpretation   Date/Time:  Friday July 01 2013 06:15:24 EDT Ventricular Rate:  73 PR Interval:  186 QRS Duration: 97 QT Interval:  402 QTC Calculation: 443 R Axis:   81 Text Interpretation:  Sinus rhythm Premature ventricular complexes Minimal  ST depression, v2-5 Confirmed by Jeryl Umholtz  MD, Peniel Hass ([id number]) on 07/01/2013  6:20:09 AM     88yF with CP. Atypical sounding. EKG does appear to have new changes in precordial leads though. Will admit for r/o.   Virgel Manifold, MD 07/09/13 1053

## 2013-07-11 DIAGNOSIS — K5900 Constipation, unspecified: Secondary | ICD-10-CM | POA: Diagnosis not present

## 2013-07-11 DIAGNOSIS — J449 Chronic obstructive pulmonary disease, unspecified: Secondary | ICD-10-CM | POA: Diagnosis not present

## 2013-07-11 DIAGNOSIS — N200 Calculus of kidney: Secondary | ICD-10-CM | POA: Diagnosis not present

## 2013-07-11 DIAGNOSIS — N179 Acute kidney failure, unspecified: Secondary | ICD-10-CM | POA: Diagnosis not present

## 2013-07-12 DIAGNOSIS — E1129 Type 2 diabetes mellitus with other diabetic kidney complication: Secondary | ICD-10-CM | POA: Diagnosis not present

## 2013-07-12 DIAGNOSIS — H353290 Exudative age-related macular degeneration, unspecified eye, stage unspecified: Secondary | ICD-10-CM | POA: Diagnosis not present

## 2013-07-12 DIAGNOSIS — H35059 Retinal neovascularization, unspecified, unspecified eye: Secondary | ICD-10-CM | POA: Diagnosis not present

## 2013-07-12 DIAGNOSIS — N183 Chronic kidney disease, stage 3 unspecified: Secondary | ICD-10-CM | POA: Diagnosis not present

## 2013-07-12 DIAGNOSIS — I5022 Chronic systolic (congestive) heart failure: Secondary | ICD-10-CM | POA: Diagnosis not present

## 2013-07-12 DIAGNOSIS — I509 Heart failure, unspecified: Secondary | ICD-10-CM | POA: Diagnosis not present

## 2013-07-12 DIAGNOSIS — N319 Neuromuscular dysfunction of bladder, unspecified: Secondary | ICD-10-CM | POA: Diagnosis not present

## 2013-07-12 DIAGNOSIS — D563 Thalassemia minor: Secondary | ICD-10-CM | POA: Diagnosis not present

## 2013-07-13 DIAGNOSIS — N401 Enlarged prostate with lower urinary tract symptoms: Secondary | ICD-10-CM | POA: Diagnosis not present

## 2013-07-13 DIAGNOSIS — N201 Calculus of ureter: Secondary | ICD-10-CM | POA: Diagnosis not present

## 2013-07-14 DIAGNOSIS — I5022 Chronic systolic (congestive) heart failure: Secondary | ICD-10-CM | POA: Diagnosis not present

## 2013-07-14 DIAGNOSIS — I509 Heart failure, unspecified: Secondary | ICD-10-CM | POA: Diagnosis not present

## 2013-07-14 DIAGNOSIS — N183 Chronic kidney disease, stage 3 unspecified: Secondary | ICD-10-CM | POA: Diagnosis not present

## 2013-07-14 DIAGNOSIS — E1129 Type 2 diabetes mellitus with other diabetic kidney complication: Secondary | ICD-10-CM | POA: Diagnosis not present

## 2013-07-14 DIAGNOSIS — N319 Neuromuscular dysfunction of bladder, unspecified: Secondary | ICD-10-CM | POA: Diagnosis not present

## 2013-07-14 DIAGNOSIS — D563 Thalassemia minor: Secondary | ICD-10-CM | POA: Diagnosis not present

## 2013-07-15 DIAGNOSIS — N319 Neuromuscular dysfunction of bladder, unspecified: Secondary | ICD-10-CM | POA: Diagnosis not present

## 2013-07-15 DIAGNOSIS — E1129 Type 2 diabetes mellitus with other diabetic kidney complication: Secondary | ICD-10-CM | POA: Diagnosis not present

## 2013-07-15 DIAGNOSIS — I5022 Chronic systolic (congestive) heart failure: Secondary | ICD-10-CM | POA: Diagnosis not present

## 2013-07-15 DIAGNOSIS — I509 Heart failure, unspecified: Secondary | ICD-10-CM | POA: Diagnosis not present

## 2013-07-15 DIAGNOSIS — N183 Chronic kidney disease, stage 3 unspecified: Secondary | ICD-10-CM | POA: Diagnosis not present

## 2013-07-15 DIAGNOSIS — D563 Thalassemia minor: Secondary | ICD-10-CM | POA: Diagnosis not present

## 2013-07-18 ENCOUNTER — Encounter: Payer: Medicare Other | Admitting: *Deleted

## 2013-07-18 DIAGNOSIS — N200 Calculus of kidney: Secondary | ICD-10-CM | POA: Diagnosis not present

## 2013-07-18 DIAGNOSIS — K5900 Constipation, unspecified: Secondary | ICD-10-CM | POA: Diagnosis not present

## 2013-07-18 DIAGNOSIS — J449 Chronic obstructive pulmonary disease, unspecified: Secondary | ICD-10-CM | POA: Diagnosis not present

## 2013-07-18 DIAGNOSIS — N179 Acute kidney failure, unspecified: Secondary | ICD-10-CM | POA: Diagnosis not present

## 2013-07-20 DIAGNOSIS — N319 Neuromuscular dysfunction of bladder, unspecified: Secondary | ICD-10-CM | POA: Diagnosis not present

## 2013-07-20 DIAGNOSIS — E1129 Type 2 diabetes mellitus with other diabetic kidney complication: Secondary | ICD-10-CM | POA: Diagnosis not present

## 2013-07-20 DIAGNOSIS — D563 Thalassemia minor: Secondary | ICD-10-CM | POA: Diagnosis not present

## 2013-07-20 DIAGNOSIS — I509 Heart failure, unspecified: Secondary | ICD-10-CM | POA: Diagnosis not present

## 2013-07-20 DIAGNOSIS — N183 Chronic kidney disease, stage 3 unspecified: Secondary | ICD-10-CM | POA: Diagnosis not present

## 2013-07-20 DIAGNOSIS — I5022 Chronic systolic (congestive) heart failure: Secondary | ICD-10-CM | POA: Diagnosis not present

## 2013-07-27 DIAGNOSIS — I5022 Chronic systolic (congestive) heart failure: Secondary | ICD-10-CM | POA: Diagnosis not present

## 2013-07-27 DIAGNOSIS — N183 Chronic kidney disease, stage 3 unspecified: Secondary | ICD-10-CM | POA: Diagnosis not present

## 2013-07-27 DIAGNOSIS — N319 Neuromuscular dysfunction of bladder, unspecified: Secondary | ICD-10-CM | POA: Diagnosis not present

## 2013-07-27 DIAGNOSIS — I509 Heart failure, unspecified: Secondary | ICD-10-CM | POA: Diagnosis not present

## 2013-07-27 DIAGNOSIS — E1129 Type 2 diabetes mellitus with other diabetic kidney complication: Secondary | ICD-10-CM | POA: Diagnosis not present

## 2013-07-27 DIAGNOSIS — D563 Thalassemia minor: Secondary | ICD-10-CM | POA: Diagnosis not present

## 2013-08-01 DIAGNOSIS — N23 Unspecified renal colic: Secondary | ICD-10-CM | POA: Diagnosis not present

## 2013-08-01 DIAGNOSIS — I509 Heart failure, unspecified: Secondary | ICD-10-CM | POA: Diagnosis not present

## 2013-08-01 DIAGNOSIS — R319 Hematuria, unspecified: Secondary | ICD-10-CM | POA: Diagnosis not present

## 2013-08-01 DIAGNOSIS — N319 Neuromuscular dysfunction of bladder, unspecified: Secondary | ICD-10-CM | POA: Diagnosis not present

## 2013-08-01 DIAGNOSIS — N183 Chronic kidney disease, stage 3 unspecified: Secondary | ICD-10-CM | POA: Diagnosis not present

## 2013-08-01 DIAGNOSIS — I5022 Chronic systolic (congestive) heart failure: Secondary | ICD-10-CM | POA: Diagnosis not present

## 2013-08-01 DIAGNOSIS — D563 Thalassemia minor: Secondary | ICD-10-CM | POA: Diagnosis not present

## 2013-08-01 DIAGNOSIS — R350 Frequency of micturition: Secondary | ICD-10-CM | POA: Diagnosis not present

## 2013-08-01 DIAGNOSIS — E1129 Type 2 diabetes mellitus with other diabetic kidney complication: Secondary | ICD-10-CM | POA: Diagnosis not present

## 2013-08-03 DIAGNOSIS — I5022 Chronic systolic (congestive) heart failure: Secondary | ICD-10-CM | POA: Diagnosis not present

## 2013-08-03 DIAGNOSIS — I509 Heart failure, unspecified: Secondary | ICD-10-CM | POA: Diagnosis not present

## 2013-08-03 DIAGNOSIS — E1129 Type 2 diabetes mellitus with other diabetic kidney complication: Secondary | ICD-10-CM | POA: Diagnosis not present

## 2013-08-03 DIAGNOSIS — N183 Chronic kidney disease, stage 3 unspecified: Secondary | ICD-10-CM | POA: Diagnosis not present

## 2013-08-03 DIAGNOSIS — N319 Neuromuscular dysfunction of bladder, unspecified: Secondary | ICD-10-CM | POA: Diagnosis not present

## 2013-08-03 DIAGNOSIS — D563 Thalassemia minor: Secondary | ICD-10-CM | POA: Diagnosis not present

## 2013-08-08 DIAGNOSIS — N183 Chronic kidney disease, stage 3 unspecified: Secondary | ICD-10-CM | POA: Diagnosis not present

## 2013-08-08 DIAGNOSIS — D563 Thalassemia minor: Secondary | ICD-10-CM | POA: Diagnosis not present

## 2013-08-08 DIAGNOSIS — I509 Heart failure, unspecified: Secondary | ICD-10-CM | POA: Diagnosis not present

## 2013-08-08 DIAGNOSIS — E1129 Type 2 diabetes mellitus with other diabetic kidney complication: Secondary | ICD-10-CM | POA: Diagnosis not present

## 2013-08-08 DIAGNOSIS — N319 Neuromuscular dysfunction of bladder, unspecified: Secondary | ICD-10-CM | POA: Diagnosis not present

## 2013-08-08 DIAGNOSIS — R69 Illness, unspecified: Secondary | ICD-10-CM | POA: Diagnosis not present

## 2013-08-08 DIAGNOSIS — I5022 Chronic systolic (congestive) heart failure: Secondary | ICD-10-CM | POA: Diagnosis not present

## 2013-08-08 DIAGNOSIS — N390 Urinary tract infection, site not specified: Secondary | ICD-10-CM | POA: Diagnosis not present

## 2013-08-09 DIAGNOSIS — D563 Thalassemia minor: Secondary | ICD-10-CM | POA: Diagnosis not present

## 2013-08-09 DIAGNOSIS — I509 Heart failure, unspecified: Secondary | ICD-10-CM | POA: Diagnosis not present

## 2013-08-09 DIAGNOSIS — N319 Neuromuscular dysfunction of bladder, unspecified: Secondary | ICD-10-CM | POA: Diagnosis not present

## 2013-08-09 DIAGNOSIS — E1129 Type 2 diabetes mellitus with other diabetic kidney complication: Secondary | ICD-10-CM | POA: Diagnosis not present

## 2013-08-09 DIAGNOSIS — I5022 Chronic systolic (congestive) heart failure: Secondary | ICD-10-CM | POA: Diagnosis not present

## 2013-08-09 DIAGNOSIS — N183 Chronic kidney disease, stage 3 unspecified: Secondary | ICD-10-CM | POA: Diagnosis not present

## 2013-08-15 ENCOUNTER — Encounter: Payer: Self-pay | Admitting: *Deleted

## 2013-09-15 DIAGNOSIS — I10 Essential (primary) hypertension: Secondary | ICD-10-CM | POA: Diagnosis not present

## 2013-09-15 DIAGNOSIS — E782 Mixed hyperlipidemia: Secondary | ICD-10-CM | POA: Diagnosis not present

## 2013-09-15 DIAGNOSIS — J449 Chronic obstructive pulmonary disease, unspecified: Secondary | ICD-10-CM | POA: Diagnosis not present

## 2013-09-15 DIAGNOSIS — N183 Chronic kidney disease, stage 3 unspecified: Secondary | ICD-10-CM | POA: Diagnosis not present

## 2013-09-15 DIAGNOSIS — E119 Type 2 diabetes mellitus without complications: Secondary | ICD-10-CM | POA: Diagnosis not present

## 2013-09-15 DIAGNOSIS — I2510 Atherosclerotic heart disease of native coronary artery without angina pectoris: Secondary | ICD-10-CM | POA: Diagnosis not present

## 2013-09-15 DIAGNOSIS — N400 Enlarged prostate without lower urinary tract symptoms: Secondary | ICD-10-CM | POA: Diagnosis not present

## 2013-09-15 DIAGNOSIS — Z0000 Encounter for general adult medical examination without abnormal findings: Secondary | ICD-10-CM | POA: Diagnosis not present

## 2013-09-15 DIAGNOSIS — E538 Deficiency of other specified B group vitamins: Secondary | ICD-10-CM | POA: Diagnosis not present

## 2013-09-19 ENCOUNTER — Telehealth: Payer: Self-pay | Admitting: Cardiology

## 2013-09-19 ENCOUNTER — Encounter: Payer: Medicare Other | Admitting: *Deleted

## 2013-09-19 NOTE — Telephone Encounter (Signed)
LMOVM reminding pt to send remote transmission.   

## 2013-09-20 ENCOUNTER — Encounter: Payer: Self-pay | Admitting: Cardiology

## 2013-09-23 ENCOUNTER — Ambulatory Visit (INDEPENDENT_AMBULATORY_CARE_PROVIDER_SITE_OTHER): Payer: Medicare Other | Admitting: *Deleted

## 2013-09-23 DIAGNOSIS — I5022 Chronic systolic (congestive) heart failure: Secondary | ICD-10-CM | POA: Diagnosis not present

## 2013-09-23 DIAGNOSIS — I2589 Other forms of chronic ischemic heart disease: Secondary | ICD-10-CM | POA: Diagnosis not present

## 2013-09-23 LAB — MDC_IDC_ENUM_SESS_TYPE_REMOTE
Battery Remaining Percentage: 65 %
Battery Voltage: 2.98 V
Brady Statistic AS VP Percent: 1 %
HIGH POWER IMPEDANCE MEASURED VALUE: 79 Ohm
HIGH POWER IMPEDANCE MEASURED VALUE: 79 Ohm
Lead Channel Impedance Value: 400 Ohm
Lead Channel Pacing Threshold Amplitude: 0.5 V
Lead Channel Pacing Threshold Amplitude: 1 V
Lead Channel Pacing Threshold Pulse Width: 0.5 ms
Lead Channel Sensing Intrinsic Amplitude: 10.9 mV
Lead Channel Sensing Intrinsic Amplitude: 3.9 mV
Lead Channel Setting Pacing Amplitude: 2 V
Lead Channel Setting Pacing Amplitude: 2.5 V
MDC IDC MSMT BATTERY REMAINING LONGEVITY: 73 mo
MDC IDC MSMT LEADCHNL RA IMPEDANCE VALUE: 440 Ohm
MDC IDC MSMT LEADCHNL RV PACING THRESHOLD PULSEWIDTH: 0.5 ms
MDC IDC PG SERIAL: 623396
MDC IDC SESS DTM: 20150828160756
MDC IDC SET LEADCHNL RV PACING PULSEWIDTH: 0.5 ms
MDC IDC SET LEADCHNL RV SENSING SENSITIVITY: 0.5 mV
MDC IDC SET ZONE DETECTION INTERVAL: 270 ms
MDC IDC SET ZONE DETECTION INTERVAL: 335 ms
MDC IDC STAT BRADY AP VP PERCENT: 0 %
MDC IDC STAT BRADY AP VS PERCENT: 1 %
MDC IDC STAT BRADY AS VS PERCENT: 99 %
MDC IDC STAT BRADY RA PERCENT PACED: 1 %
MDC IDC STAT BRADY RV PERCENT PACED: 1 %

## 2013-09-23 NOTE — Progress Notes (Signed)
Remote ICD transmission.   

## 2013-09-27 ENCOUNTER — Encounter: Payer: Self-pay | Admitting: Cardiology

## 2013-10-17 ENCOUNTER — Encounter: Payer: Self-pay | Admitting: Internal Medicine

## 2013-10-17 ENCOUNTER — Other Ambulatory Visit: Payer: Self-pay | Admitting: Cardiovascular Disease

## 2013-11-11 ENCOUNTER — Encounter (HOSPITAL_COMMUNITY): Payer: Self-pay | Admitting: Emergency Medicine

## 2013-11-11 ENCOUNTER — Emergency Department (HOSPITAL_COMMUNITY): Payer: Medicare Other

## 2013-11-11 ENCOUNTER — Emergency Department (HOSPITAL_COMMUNITY)
Admission: EM | Admit: 2013-11-11 | Discharge: 2013-11-12 | Disposition: A | Discharge status: Home / Self Care | Payer: Medicare Other | Source: Home / Self Care | Attending: Emergency Medicine | Admitting: Emergency Medicine

## 2013-11-11 DIAGNOSIS — J449 Chronic obstructive pulmonary disease, unspecified: Secondary | ICD-10-CM | POA: Diagnosis not present

## 2013-11-11 DIAGNOSIS — N400 Enlarged prostate without lower urinary tract symptoms: Secondary | ICD-10-CM

## 2013-11-11 DIAGNOSIS — Z862 Personal history of diseases of the blood and blood-forming organs and certain disorders involving the immune mechanism: Secondary | ICD-10-CM | POA: Insufficient documentation

## 2013-11-11 DIAGNOSIS — Z79899 Other long term (current) drug therapy: Secondary | ICD-10-CM | POA: Insufficient documentation

## 2013-11-11 DIAGNOSIS — N170 Acute kidney failure with tubular necrosis: Secondary | ICD-10-CM | POA: Diagnosis not present

## 2013-11-11 DIAGNOSIS — Z8674 Personal history of sudden cardiac arrest: Secondary | ICD-10-CM | POA: Diagnosis not present

## 2013-11-11 DIAGNOSIS — R6520 Severe sepsis without septic shock: Secondary | ICD-10-CM | POA: Diagnosis present

## 2013-11-11 DIAGNOSIS — E785 Hyperlipidemia, unspecified: Secondary | ICD-10-CM

## 2013-11-11 DIAGNOSIS — I509 Heart failure, unspecified: Secondary | ICD-10-CM | POA: Insufficient documentation

## 2013-11-11 DIAGNOSIS — Z7982 Long term (current) use of aspirin: Secondary | ICD-10-CM

## 2013-11-11 DIAGNOSIS — R140 Abdominal distension (gaseous): Secondary | ICD-10-CM | POA: Diagnosis not present

## 2013-11-11 DIAGNOSIS — K560 Paralytic ileus: Secondary | ICD-10-CM

## 2013-11-11 DIAGNOSIS — N189 Chronic kidney disease, unspecified: Secondary | ICD-10-CM

## 2013-11-11 DIAGNOSIS — Z95810 Presence of automatic (implantable) cardiac defibrillator: Secondary | ICD-10-CM | POA: Diagnosis not present

## 2013-11-11 DIAGNOSIS — K5740 Diverticulitis of both small and large intestine with perforation and abscess without bleeding: Secondary | ICD-10-CM | POA: Diagnosis not present

## 2013-11-11 DIAGNOSIS — A419 Sepsis, unspecified organism: Secondary | ICD-10-CM | POA: Diagnosis not present

## 2013-11-11 DIAGNOSIS — D563 Thalassemia minor: Secondary | ICD-10-CM | POA: Diagnosis present

## 2013-11-11 DIAGNOSIS — R0600 Dyspnea, unspecified: Secondary | ICD-10-CM | POA: Diagnosis not present

## 2013-11-11 DIAGNOSIS — Z955 Presence of coronary angioplasty implant and graft: Secondary | ICD-10-CM | POA: Diagnosis not present

## 2013-11-11 DIAGNOSIS — Z7951 Long term (current) use of inhaled steroids: Secondary | ICD-10-CM | POA: Insufficient documentation

## 2013-11-11 DIAGNOSIS — R0902 Hypoxemia: Secondary | ICD-10-CM | POA: Diagnosis not present

## 2013-11-11 DIAGNOSIS — K5732 Diverticulitis of large intestine without perforation or abscess without bleeding: Secondary | ICD-10-CM | POA: Diagnosis not present

## 2013-11-11 DIAGNOSIS — K5790 Diverticulosis of intestine, part unspecified, without perforation or abscess without bleeding: Secondary | ICD-10-CM | POA: Diagnosis not present

## 2013-11-11 DIAGNOSIS — E870 Hyperosmolality and hypernatremia: Secondary | ICD-10-CM | POA: Diagnosis not present

## 2013-11-11 DIAGNOSIS — J441 Chronic obstructive pulmonary disease with (acute) exacerbation: Secondary | ICD-10-CM

## 2013-11-11 DIAGNOSIS — K5780 Diverticulitis of intestine, part unspecified, with perforation and abscess without bleeding: Secondary | ICD-10-CM | POA: Diagnosis not present

## 2013-11-11 DIAGNOSIS — I255 Ischemic cardiomyopathy: Secondary | ICD-10-CM | POA: Diagnosis not present

## 2013-11-11 DIAGNOSIS — I2510 Atherosclerotic heart disease of native coronary artery without angina pectoris: Secondary | ICD-10-CM

## 2013-11-11 DIAGNOSIS — N281 Cyst of kidney, acquired: Secondary | ICD-10-CM | POA: Diagnosis not present

## 2013-11-11 DIAGNOSIS — E871 Hypo-osmolality and hyponatremia: Secondary | ICD-10-CM | POA: Diagnosis present

## 2013-11-11 DIAGNOSIS — E860 Dehydration: Secondary | ICD-10-CM | POA: Diagnosis present

## 2013-11-11 DIAGNOSIS — R0602 Shortness of breath: Secondary | ICD-10-CM | POA: Diagnosis not present

## 2013-11-11 DIAGNOSIS — N390 Urinary tract infection, site not specified: Secondary | ICD-10-CM | POA: Insufficient documentation

## 2013-11-11 DIAGNOSIS — D638 Anemia in other chronic diseases classified elsewhere: Secondary | ICD-10-CM | POA: Diagnosis not present

## 2013-11-11 DIAGNOSIS — I517 Cardiomegaly: Secondary | ICD-10-CM | POA: Diagnosis not present

## 2013-11-11 DIAGNOSIS — E119 Type 2 diabetes mellitus without complications: Secondary | ICD-10-CM | POA: Insufficient documentation

## 2013-11-11 DIAGNOSIS — N508 Other specified disorders of male genital organs: Secondary | ICD-10-CM | POA: Diagnosis not present

## 2013-11-11 DIAGNOSIS — D6959 Other secondary thrombocytopenia: Secondary | ICD-10-CM | POA: Diagnosis present

## 2013-11-11 DIAGNOSIS — D500 Iron deficiency anemia secondary to blood loss (chronic): Secondary | ICD-10-CM | POA: Diagnosis present

## 2013-11-11 DIAGNOSIS — Z7902 Long term (current) use of antithrombotics/antiplatelets: Secondary | ICD-10-CM | POA: Insufficient documentation

## 2013-11-11 DIAGNOSIS — Z01811 Encounter for preprocedural respiratory examination: Secondary | ICD-10-CM | POA: Diagnosis not present

## 2013-11-11 DIAGNOSIS — K5720 Diverticulitis of large intestine with perforation and abscess without bleeding: Secondary | ICD-10-CM | POA: Diagnosis not present

## 2013-11-11 DIAGNOSIS — H3530 Unspecified macular degeneration: Secondary | ICD-10-CM | POA: Diagnosis present

## 2013-11-11 DIAGNOSIS — R109 Unspecified abdominal pain: Secondary | ICD-10-CM | POA: Diagnosis not present

## 2013-11-11 DIAGNOSIS — K567 Ileus, unspecified: Secondary | ICD-10-CM | POA: Diagnosis not present

## 2013-11-11 DIAGNOSIS — K5660 Unspecified intestinal obstruction: Secondary | ICD-10-CM | POA: Diagnosis not present

## 2013-11-11 DIAGNOSIS — Z4682 Encounter for fitting and adjustment of non-vascular catheter: Secondary | ICD-10-CM | POA: Diagnosis not present

## 2013-11-11 DIAGNOSIS — Z8669 Personal history of other diseases of the nervous system and sense organs: Secondary | ICD-10-CM | POA: Insufficient documentation

## 2013-11-11 DIAGNOSIS — J9601 Acute respiratory failure with hypoxia: Secondary | ICD-10-CM | POA: Diagnosis present

## 2013-11-11 DIAGNOSIS — M7989 Other specified soft tissue disorders: Secondary | ICD-10-CM | POA: Diagnosis not present

## 2013-11-11 DIAGNOSIS — N183 Chronic kidney disease, stage 3 (moderate): Secondary | ICD-10-CM | POA: Diagnosis present

## 2013-11-11 DIAGNOSIS — K8020 Calculus of gallbladder without cholecystitis without obstruction: Secondary | ICD-10-CM | POA: Diagnosis not present

## 2013-11-11 DIAGNOSIS — R531 Weakness: Secondary | ICD-10-CM | POA: Diagnosis not present

## 2013-11-11 DIAGNOSIS — Z9981 Dependence on supplemental oxygen: Secondary | ICD-10-CM | POA: Insufficient documentation

## 2013-11-11 DIAGNOSIS — J439 Emphysema, unspecified: Secondary | ICD-10-CM | POA: Diagnosis not present

## 2013-11-11 DIAGNOSIS — N289 Disorder of kidney and ureter, unspecified: Secondary | ICD-10-CM | POA: Diagnosis not present

## 2013-11-11 DIAGNOSIS — Z8679 Personal history of other diseases of the circulatory system: Secondary | ICD-10-CM | POA: Diagnosis not present

## 2013-11-11 DIAGNOSIS — I252 Old myocardial infarction: Secondary | ICD-10-CM | POA: Insufficient documentation

## 2013-11-11 DIAGNOSIS — R509 Fever, unspecified: Secondary | ICD-10-CM | POA: Diagnosis not present

## 2013-11-11 DIAGNOSIS — Z6823 Body mass index (BMI) 23.0-23.9, adult: Secondary | ICD-10-CM | POA: Diagnosis not present

## 2013-11-11 DIAGNOSIS — E43 Unspecified severe protein-calorie malnutrition: Secondary | ICD-10-CM | POA: Diagnosis present

## 2013-11-11 DIAGNOSIS — I5022 Chronic systolic (congestive) heart failure: Secondary | ICD-10-CM | POA: Diagnosis present

## 2013-11-11 DIAGNOSIS — Z87891 Personal history of nicotine dependence: Secondary | ICD-10-CM | POA: Insufficient documentation

## 2013-11-11 DIAGNOSIS — K631 Perforation of intestine (nontraumatic): Secondary | ICD-10-CM | POA: Diagnosis not present

## 2013-11-11 DIAGNOSIS — I129 Hypertensive chronic kidney disease with stage 1 through stage 4 chronic kidney disease, or unspecified chronic kidney disease: Secondary | ICD-10-CM | POA: Diagnosis present

## 2013-11-11 DIAGNOSIS — K668 Other specified disorders of peritoneum: Secondary | ICD-10-CM | POA: Diagnosis not present

## 2013-11-11 DIAGNOSIS — D62 Acute posthemorrhagic anemia: Secondary | ICD-10-CM | POA: Diagnosis present

## 2013-11-11 DIAGNOSIS — N179 Acute kidney failure, unspecified: Secondary | ICD-10-CM | POA: Diagnosis present

## 2013-11-11 LAB — CBC WITH DIFFERENTIAL/PLATELET
BASOS PCT: 0 % (ref 0–1)
Basophils Absolute: 0 10*3/uL (ref 0.0–0.1)
EOS PCT: 0 % (ref 0–5)
Eosinophils Absolute: 0 10*3/uL (ref 0.0–0.7)
HCT: 28.8 % — ABNORMAL LOW (ref 39.0–52.0)
HEMOGLOBIN: 9.2 g/dL — AB (ref 13.0–17.0)
LYMPHS PCT: 6 % — AB (ref 12–46)
Lymphs Abs: 0.8 10*3/uL (ref 0.7–4.0)
MCH: 21.4 pg — ABNORMAL LOW (ref 26.0–34.0)
MCHC: 31.9 g/dL (ref 30.0–36.0)
MCV: 67 fL — ABNORMAL LOW (ref 78.0–100.0)
MONOS PCT: 5 % (ref 3–12)
Monocytes Absolute: 0.6 10*3/uL (ref 0.1–1.0)
NEUTROS PCT: 89 % — AB (ref 43–77)
Neutro Abs: 11.4 10*3/uL — ABNORMAL HIGH (ref 1.7–7.7)
Platelets: 82 10*3/uL — ABNORMAL LOW (ref 150–400)
RBC: 4.3 MIL/uL (ref 4.22–5.81)
RDW: 17.6 % — ABNORMAL HIGH (ref 11.5–15.5)
WBC: 12.8 10*3/uL — ABNORMAL HIGH (ref 4.0–10.5)

## 2013-11-11 LAB — URINALYSIS, ROUTINE W REFLEX MICROSCOPIC
Bilirubin Urine: NEGATIVE
Glucose, UA: NEGATIVE mg/dL
KETONES UR: NEGATIVE mg/dL
NITRITE: POSITIVE — AB
PH: 5 (ref 5.0–8.0)
Protein, ur: 100 mg/dL — AB
SPECIFIC GRAVITY, URINE: 1.019 (ref 1.005–1.030)
Urobilinogen, UA: 1 mg/dL (ref 0.0–1.0)

## 2013-11-11 LAB — BASIC METABOLIC PANEL
ANION GAP: 16 — AB (ref 5–15)
BUN: 33 mg/dL — ABNORMAL HIGH (ref 6–23)
CHLORIDE: 100 meq/L (ref 96–112)
CO2: 23 meq/L (ref 19–32)
CREATININE: 1.69 mg/dL — AB (ref 0.50–1.35)
Calcium: 8.5 mg/dL (ref 8.4–10.5)
GFR calc Af Amer: 41 mL/min — ABNORMAL LOW (ref >90)
GFR calc non Af Amer: 35 mL/min — ABNORMAL LOW (ref >90)
Glucose, Bld: 101 mg/dL — ABNORMAL HIGH (ref 70–99)
Potassium: 4.4 mEq/L (ref 3.7–5.3)
Sodium: 139 mEq/L (ref 137–147)

## 2013-11-11 LAB — URINE MICROSCOPIC-ADD ON

## 2013-11-11 LAB — PRO B NATRIURETIC PEPTIDE: Pro B Natriuretic peptide (BNP): 759.3 pg/mL — ABNORMAL HIGH (ref 0–450)

## 2013-11-11 LAB — I-STAT CG4 LACTIC ACID, ED: LACTIC ACID, VENOUS: 1.29 mmol/L (ref 0.5–2.2)

## 2013-11-11 LAB — TROPONIN I

## 2013-11-11 MED ORDER — CIPROFLOXACIN HCL 500 MG PO TABS: 500.0000 mg | ORAL_TABLET | Freq: Two times a day (BID) | ORAL | Status: DC

## 2013-11-11 MED ORDER — IPRATROPIUM BROMIDE 0.02 % IN SOLN
0.5000 mg | Freq: Once | RESPIRATORY_TRACT | Status: AC
  Administered 2013-11-11: 0.5 mg via RESPIRATORY_TRACT
  Filled 2013-11-11: qty 2.5

## 2013-11-11 MED ORDER — METHYLPREDNISOLONE SODIUM SUCC 125 MG IJ SOLR
80.0000 mg | Freq: Once | INTRAMUSCULAR | Status: AC
  Administered 2013-11-11: 80 mg via INTRAVENOUS
  Filled 2013-11-11: qty 2

## 2013-11-11 MED ORDER — PREDNISONE 20 MG PO TABS: ORAL_TABLET | ORAL | Status: DC

## 2013-11-11 MED ORDER — DEXTROSE 5 % IV SOLN
1.0000 g | Freq: Once | INTRAVENOUS | Status: AC
  Administered 2013-11-11: 1 g via INTRAVENOUS
  Filled 2013-11-11: qty 10

## 2013-11-11 MED ORDER — ALBUTEROL (5 MG/ML) CONTINUOUS INHALATION SOLN
10.0000 mg/h | INHALATION_SOLUTION | Freq: Once | RESPIRATORY_TRACT | Status: AC
  Administered 2013-11-11: 10 mg/h via RESPIRATORY_TRACT
  Filled 2013-11-11: qty 20

## 2013-11-11 NOTE — ED Provider Notes (Signed)
CSN: [id number]     Arrival date & time 11/11/13  1942 History   First MD Initiated Contact with Patient 11/11/13 2037     Chief Complaint  Patient presents with  . Shortness of Breath  . Urinary Tract Infection     (Consider location/radiation/quality/duration/timing/severity/associated sxs/prior Treatment) HPI Comments: 80 year old male with diabetes, defibrillator, heart failure, heart attack, COPD on oxygen at night presents with increased shortness of breath and recent urine infection diagnosed. Patient was sent from [facility] walk-in clinic for further evaluation. Patient has had mild right hand and wrist edema noticed the past few days, no injuries, new rash or fevers. Patient has mild bruising which is old. No blood clot history or recent surgeries. Patient is a gradually worsening shortness of breath past 2 days similar to COPD however gradually worsening. No current chest pain, mild general weakness. No current antibiotics, urine infected per report at [facility].  Patient is a 80 y.o. male presenting with shortness of breath and urinary tract infection. The history is provided by the patient and a relative.  Shortness of Breath Associated symptoms: cough (nonproductive) and wheezing   Associated symptoms: no abdominal pain, no chest pain, no fever, no headaches, no neck pain, no rash and no vomiting   Urinary Tract Infection Associated symptoms include shortness of breath. Pertinent negatives include no chest pain, no abdominal pain and no headaches.    Past Medical History  Diagnosis Date  . CAD (coronary artery disease)   . Ischemic cardiomyopathy   . CHF (congestive heart failure)   . Dyslipidemia   . Diverticulosis of colon (without mention of hemorrhage)   . Diabetes mellitus   . Anemia, unspecified   . Unspecified disorder resulting from impaired renal function   . Chronic airway obstruction, not elsewhere classified     on o2 at night  . Prostatic hypertrophy      hx of  . Retention of urine, unspecified   . ICD (implantable cardiac defibrillator) in place     st jude  . Emphysema   . Hemorrhoids   . MI, old 2010 or 2011  . Macular degeneration   . Cataracts, bilateral    Past Surgical History  Procedure Laterality Date  . Pci      4/11  . Cardiac defibrillator placement  2011    st jude  . Pacemaker insertion  2011  . Tonsillectomy  80 yo  . Cataract extraction     Family History  Problem Relation Age of Onset  . Lung cancer Father     smoker  . Coronary artery disease Mother   . Stroke Mother   . Breast cancer Sister   . Atrial fibrillation Daughter   . Congestive Heart Failure Mother    History  Substance Use Topics  . Smoking status: Former Smoker    Types: Cigarettes    Quit date: 04/27/2009  . Smokeless tobacco: Never Used  . Alcohol Use: Yes     Comment: occasional beer    Review of Systems  Constitutional: Negative for fever and chills.  HENT: Negative for congestion.   Eyes: Negative for visual disturbance.  Respiratory: Positive for cough (nonproductive), shortness of breath and wheezing.   Cardiovascular: Positive for leg swelling (mild lower extremities bilateral similar to previous). Negative for chest pain.  Gastrointestinal: Negative for vomiting and abdominal pain.  Genitourinary: Negative for dysuria and flank pain.  Musculoskeletal: Negative for back pain, neck pain and neck stiffness.  Skin: Negative  for rash.  Neurological: Positive for weakness (general). Negative for light-headedness and headaches.      Allergies  Review of patient's allergies indicates no known allergies.  Home Medications   Prior to Admission medications   Medication Sig Start Date End Date Taking? Authorizing Provider  albuterol (PROVENTIL) (5 MG/ML) 0.5% nebulizer solution Take 0.5 mLs (2.5 mg total) by nebulization every 4 (four) hours as needed for wheezing. And q3 hrs prn for SOB 08/26/12  Yes Tammy S Parrett, NP   ALPRAZolam (XANAX) 0.5 MG tablet Take 0.5 mg by mouth at bedtime as needed for sleep.    Yes Historical Provider, MD  arformoterol (BROVANA) 15 MCG/2ML NEBU Take 2 mLs (15 mcg total) by nebulization 2 (two) times daily. 08/31/12  Yes Tammy S Parrett, NP  aspirin EC 81 MG tablet Take 81 mg by mouth at bedtime.    Yes Historical Provider, MD  budesonide (PULMICORT) 0.5 MG/2ML nebulizer solution Take 2 mLs (0.5 mg total) by nebulization 2 (two) times daily. 08/31/12  Yes Tammy S Parrett, NP  clopidogrel (PLAVIX) 75 MG tablet Take 75 mg by mouth every morning.    Yes Historical Provider, MD  famotidine (PEPCID) 20 MG tablet Take 20 mg by mouth at bedtime.  08/11/12  Yes Historical Provider, MD  losartan (COZAAR) 25 MG tablet TAKE 1 TABLET ONCE DAILY. 10/18/13  Yes Josue Hector, MD  metoprolol tartrate (LOPRESSOR) 25 MG tablet Take 12.5 mg by mouth 2 (two) times daily.    Yes Historical Provider, MD  nitroGLYCERIN (NITROSTAT) 0.4 MG SL tablet Place 1 tablet (0.4 mg total) under the tongue every 5 (five) minutes as needed for chest pain. 06/16/13 02/26/15 Yes Thompson Grayer, MD  senna (SENOKOT) 8.6 MG TABS tablet Take 1-2 tablets (8.6-17.2 mg total) by mouth at bedtime. 07/04/13  Yes [name], MD  tamsulosin (FLOMAX) 0.4 MG CAPS capsule Take 1 capsule (0.4 mg total) by mouth daily. 07/04/13  Yes [name], MD   BP 105/86  Pulse 82  Temp(Src) 99.6 F (37.6 C) (Oral)  Resp 20  Ht 6' (1.829 m)  Wt 184 lb (83.462 kg)  BMI 24.95 kg/m2  SpO2 100% Physical Exam  Nursing note and vitals reviewed. Constitutional: He is oriented to person, place, and time. He appears well-developed and well-nourished.  HENT:  Head: Normocephalic and atraumatic.  Eyes: Conjunctivae are normal. Right eye exhibits no discharge. Left eye exhibits no discharge.  Neck: Normal range of motion. Neck supple. No tracheal deviation present.  Cardiovascular: Normal rate and regular rhythm.   Pulmonary/Chest: He has wheezes (mild  tachypnea, x-ray wheezing bilateral).  Abdominal: Soft. He exhibits no distension. There is no tenderness. There is no guarding.  Musculoskeletal: He exhibits edema (mild lower chimney bilateral tenderness).  Neurological: He is alert and oriented to person, place, and time.  Skin: Skin is warm. No rash noted.  Mild swelling distal forearm and dorsal hand on the right, no warmth or cellulitis evident. Soft compartment in the right forearm.  Psychiatric: He has a normal mood and affect.    ED Course  Procedures (including critical care time) Labs Review Labs Reviewed  BASIC METABOLIC PANEL - Abnormal; Notable for the following:    Glucose, Bld 101 (*)    BUN 33 (*)    Creatinine, Ser 1.69 (*)    GFR calc non Af Amer 35 (*)    GFR calc Af Amer 41 (*)    Anion gap 16 (*)    All other  components within normal limits  CBC WITH DIFFERENTIAL - Abnormal; Notable for the following:    WBC 12.8 (*)    Hemoglobin 9.2 (*)    HCT 28.8 (*)    MCV 67.0 (*)    MCH 21.4 (*)    RDW 17.6 (*)    Platelets 82 (*)    Neutrophils Relative % 89 (*)    Lymphocytes Relative 6 (*)    Neutro Abs 11.4 (*)    All other components within normal limits  URINALYSIS, ROUTINE W REFLEX MICROSCOPIC - Abnormal; Notable for the following:    Color, Urine AMBER (*)    APPearance TURBID (*)    Hgb urine dipstick MODERATE (*)    Protein, ur 100 (*)    Nitrite POSITIVE (*)    Leukocytes, UA MODERATE (*)    All other components within normal limits  PRO B NATRIURETIC PEPTIDE - Abnormal; Notable for the following:    Pro B Natriuretic peptide (BNP) 759.3 (*)    All other components within normal limits  URINE MICROSCOPIC-ADD ON - Abnormal; Notable for the following:    Bacteria, UA MANY (*)    All other components within normal limits  URINE CULTURE  TROPONIN I  I-STAT CG4 LACTIC ACID, ED    Imaging Review Dg Chest 2 View  11/11/2013   CLINICAL DATA:  Initial evaluation for weakness, increased shortness of  breath, current history of urinary tract infection, testicular pain, history of coronary artery disease congestive heart failure diabetes myocardial infarction, former smoker  EXAM: CHEST  2 VIEW  COMPARISON:  07/01/2013  FINDINGS: Hyperinflation consistent with COPD. Stable cardiac pacer. Uncoiling of the aorta. Vascular pattern normal. No consolidation or effusion. No significant change when compared to the prior study.  IMPRESSION: COPD with no acute findings   Electronically Signed   By: Skipper Cliche M.D.   On: 11/11/2013 21:57     EKG Interpretation   Date/Time:  Friday November 11 2013 19:54:25 EDT Ventricular Rate:  89 PR Interval:  159 QRS Duration: 101 QT Interval:  361 QTC Calculation: 439 R Axis:   81 Text Interpretation:  Sinus rhythm   Bo rderline repolarization  abnormality  Baseline wander in lead(s) V5 Confirmed by Kayelyn Lemon  MD, Marrio Scribner  ([id number]) on 11/11/2013 10:14:02 PM      MDM   Final diagnoses:  Acute exacerbation of chronic obstructive pulmonary disease (COPD)  UTI (lower urinary tract infection)  General weakness  CRF (chronic renal failure), unspecified stage   Patient with COPD history on oxygen at night presents with worsening shortness of breath and recent urine infection. White blood count mild elevated, borderline temperature, Rocephin ordered and urine urine culture. Chest x-ray no acute findings. Continuous neb and steroids for clinically COPD. Troponin pending M. EKG nonspecific findings. Patient hoping to be treated outpatient however we will see how he responds to treatment and final results, discussed likely admission with acute COPD exacerbation and urine infection.  Chest x-ray reviewed no acute findings. Patient improved with continuous neb and steroids given in ER. With acute COPD exacerbation, general weakness, urine infection, age I recommended admission/observation the hospital. Patient wishes to try outpatient treatment with oral antibiotics and  continued nebs at home. Families in the room during this discussion and will help assist him at home and check on them. Plan for Cipro and prednisone outpatient. Patient received Rocephin in ER. Patient received continuous neb in ER and feels improved.  Results and differential diagnosis were discussed with  the patient/parent/guardian. Close follow up outpatient was discussed, comfortable with the plan.   Medications  cefTRIAXone (ROCEPHIN) 1 g in dextrose 5 % 50 mL IVPB (0 g Intravenous Stopped 11/11/13 2224)  albuterol (PROVENTIL,VENTOLIN) solution continuous neb (10 mg/hr Nebulization Given 11/11/13 2133)  ipratropium (ATROVENT) nebulizer solution 0.5 mg (0.5 mg Nebulization Given 11/11/13 2133)  methylPREDNISolone sodium succinate (SOLU-MEDROL) 125 mg/2 mL injection 80 mg (80 mg Intravenous Given 11/11/13 2155)    Filed Vitals:   11/11/13 1952 11/11/13 2147 11/11/13 2304  BP: 105/86    Pulse: 82    Temp: 99.6 F (37.6 C)    TempSrc: Oral    Resp: 20    Height: 6' (1.829 m)    Weight: 184 lb (83.462 kg)    SpO2: 90% 100% 91%    Final diagnoses:  Acute exacerbation of chronic obstructive pulmonary disease (COPD)  UTI (lower urinary tract infection)  General weakness  CRF (chronic renal failure), unspecified stage        Mariea Clonts, MD 11/11/13 2352

## 2013-11-11 NOTE — ED Notes (Signed)
Pt is here w/ c/o weakness, increased SOB and UTI dx at [facility] walk-in clinic.  Edema noted to right hand.  Pt reports pain in testicles.

## 2013-11-11 NOTE — Discharge Instructions (Signed)
If you were given medicines take as directed.  If you are on coumadin or contraceptives realize their levels and effectiveness is altered by many different medicines.  If you have any reaction (rash, tongues swelling, other) to the medicines stop taking and see a physician.   Please return if you change your mind and would like to be admitted or further symptoms worsen. Take antibiotics as directed starting tomorrow afternoon. Please follow up as directed and return to the ER or see a physician for new or worsening symptoms.  Thank you. Filed Vitals:   11/11/13 1952 11/11/13 2147 11/11/13 2304  BP: 105/86    Pulse: 82    Temp: 99.6 F (37.6 C)    TempSrc: Oral    Resp: 20    Height: 6' (1.829 m)    Weight: 184 lb (83.462 kg)    SpO2: 90% 100% 91%

## 2013-11-13 ENCOUNTER — Inpatient Hospital Stay (HOSPITAL_COMMUNITY)
Admission: EM | Admit: 2013-11-13 | Discharge: 2013-11-22 | DRG: 871 | Disposition: A | Discharge status: Home Health | Payer: Medicare Other | Attending: Internal Medicine | Admitting: Internal Medicine

## 2013-11-13 ENCOUNTER — Emergency Department (HOSPITAL_COMMUNITY): Payer: Medicare Other

## 2013-11-13 ENCOUNTER — Encounter (HOSPITAL_COMMUNITY): Payer: Self-pay | Admitting: Emergency Medicine

## 2013-11-13 DIAGNOSIS — J439 Emphysema, unspecified: Secondary | ICD-10-CM | POA: Diagnosis not present

## 2013-11-13 DIAGNOSIS — N390 Urinary tract infection, site not specified: Secondary | ICD-10-CM | POA: Diagnosis present

## 2013-11-13 DIAGNOSIS — K5732 Diverticulitis of large intestine without perforation or abscess without bleeding: Secondary | ICD-10-CM | POA: Diagnosis not present

## 2013-11-13 DIAGNOSIS — Z01811 Encounter for preprocedural respiratory examination: Secondary | ICD-10-CM | POA: Diagnosis not present

## 2013-11-13 DIAGNOSIS — Z87891 Personal history of nicotine dependence: Secondary | ICD-10-CM | POA: Diagnosis not present

## 2013-11-13 DIAGNOSIS — N189 Chronic kidney disease, unspecified: Secondary | ICD-10-CM | POA: Diagnosis not present

## 2013-11-13 DIAGNOSIS — I252 Old myocardial infarction: Secondary | ICD-10-CM

## 2013-11-13 DIAGNOSIS — E870 Hyperosmolality and hypernatremia: Secondary | ICD-10-CM | POA: Diagnosis not present

## 2013-11-13 DIAGNOSIS — D696 Thrombocytopenia, unspecified: Secondary | ICD-10-CM | POA: Diagnosis present

## 2013-11-13 DIAGNOSIS — R509 Fever, unspecified: Secondary | ICD-10-CM | POA: Diagnosis not present

## 2013-11-13 DIAGNOSIS — K5720 Diverticulitis of large intestine with perforation and abscess without bleeding: Secondary | ICD-10-CM | POA: Diagnosis not present

## 2013-11-13 DIAGNOSIS — K5740 Diverticulitis of both small and large intestine with perforation and abscess without bleeding: Secondary | ICD-10-CM | POA: Diagnosis not present

## 2013-11-13 DIAGNOSIS — E119 Type 2 diabetes mellitus without complications: Secondary | ICD-10-CM | POA: Diagnosis present

## 2013-11-13 DIAGNOSIS — Z4682 Encounter for fitting and adjustment of non-vascular catheter: Secondary | ICD-10-CM | POA: Diagnosis not present

## 2013-11-13 DIAGNOSIS — N289 Disorder of kidney and ureter, unspecified: Secondary | ICD-10-CM | POA: Diagnosis not present

## 2013-11-13 DIAGNOSIS — E860 Dehydration: Secondary | ICD-10-CM | POA: Diagnosis present

## 2013-11-13 DIAGNOSIS — D6959 Other secondary thrombocytopenia: Secondary | ICD-10-CM | POA: Diagnosis present

## 2013-11-13 DIAGNOSIS — K631 Perforation of intestine (nontraumatic): Secondary | ICD-10-CM | POA: Diagnosis not present

## 2013-11-13 DIAGNOSIS — I517 Cardiomegaly: Secondary | ICD-10-CM | POA: Diagnosis not present

## 2013-11-13 DIAGNOSIS — Z8679 Personal history of other diseases of the circulatory system: Secondary | ICD-10-CM | POA: Diagnosis not present

## 2013-11-13 DIAGNOSIS — N183 Chronic kidney disease, stage 3 unspecified: Secondary | ICD-10-CM | POA: Diagnosis present

## 2013-11-13 DIAGNOSIS — K8020 Calculus of gallbladder without cholecystitis without obstruction: Secondary | ICD-10-CM | POA: Diagnosis not present

## 2013-11-13 DIAGNOSIS — J9600 Acute respiratory failure, unspecified whether with hypoxia or hypercapnia: Secondary | ICD-10-CM | POA: Diagnosis present

## 2013-11-13 DIAGNOSIS — I255 Ischemic cardiomyopathy: Secondary | ICD-10-CM | POA: Diagnosis not present

## 2013-11-13 DIAGNOSIS — R6520 Severe sepsis without septic shock: Secondary | ICD-10-CM | POA: Diagnosis present

## 2013-11-13 DIAGNOSIS — K5780 Diverticulitis of intestine, part unspecified, with perforation and abscess without bleeding: Secondary | ICD-10-CM | POA: Diagnosis not present

## 2013-11-13 DIAGNOSIS — K567 Ileus, unspecified: Secondary | ICD-10-CM

## 2013-11-13 DIAGNOSIS — K668 Other specified disorders of peritoneum: Secondary | ICD-10-CM | POA: Diagnosis not present

## 2013-11-13 DIAGNOSIS — J9601 Acute respiratory failure with hypoxia: Secondary | ICD-10-CM | POA: Diagnosis not present

## 2013-11-13 DIAGNOSIS — I129 Hypertensive chronic kidney disease with stage 1 through stage 4 chronic kidney disease, or unspecified chronic kidney disease: Secondary | ICD-10-CM | POA: Diagnosis present

## 2013-11-13 DIAGNOSIS — J449 Chronic obstructive pulmonary disease, unspecified: Secondary | ICD-10-CM

## 2013-11-13 DIAGNOSIS — Z955 Presence of coronary angioplasty implant and graft: Secondary | ICD-10-CM

## 2013-11-13 DIAGNOSIS — I5022 Chronic systolic (congestive) heart failure: Secondary | ICD-10-CM

## 2013-11-13 DIAGNOSIS — N179 Acute kidney failure, unspecified: Secondary | ICD-10-CM

## 2013-11-13 DIAGNOSIS — Z8674 Personal history of sudden cardiac arrest: Secondary | ICD-10-CM | POA: Diagnosis not present

## 2013-11-13 DIAGNOSIS — Z95810 Presence of automatic (implantable) cardiac defibrillator: Secondary | ICD-10-CM | POA: Diagnosis not present

## 2013-11-13 DIAGNOSIS — J441 Chronic obstructive pulmonary disease with (acute) exacerbation: Secondary | ICD-10-CM | POA: Diagnosis not present

## 2013-11-13 DIAGNOSIS — E43 Unspecified severe protein-calorie malnutrition: Secondary | ICD-10-CM | POA: Diagnosis present

## 2013-11-13 DIAGNOSIS — N281 Cyst of kidney, acquired: Secondary | ICD-10-CM | POA: Diagnosis not present

## 2013-11-13 DIAGNOSIS — Z7982 Long term (current) use of aspirin: Secondary | ICD-10-CM | POA: Diagnosis not present

## 2013-11-13 DIAGNOSIS — Z6823 Body mass index (BMI) 23.0-23.9, adult: Secondary | ICD-10-CM

## 2013-11-13 DIAGNOSIS — A419 Sepsis, unspecified organism: Secondary | ICD-10-CM | POA: Diagnosis not present

## 2013-11-13 DIAGNOSIS — D500 Iron deficiency anemia secondary to blood loss (chronic): Secondary | ICD-10-CM | POA: Diagnosis present

## 2013-11-13 DIAGNOSIS — K560 Paralytic ileus: Secondary | ICD-10-CM | POA: Diagnosis not present

## 2013-11-13 DIAGNOSIS — N170 Acute kidney failure with tubular necrosis: Secondary | ICD-10-CM | POA: Diagnosis not present

## 2013-11-13 DIAGNOSIS — I509 Heart failure, unspecified: Secondary | ICD-10-CM | POA: Diagnosis not present

## 2013-11-13 DIAGNOSIS — K5660 Unspecified intestinal obstruction: Secondary | ICD-10-CM | POA: Diagnosis not present

## 2013-11-13 DIAGNOSIS — H3530 Unspecified macular degeneration: Secondary | ICD-10-CM | POA: Diagnosis present

## 2013-11-13 DIAGNOSIS — E871 Hypo-osmolality and hyponatremia: Secondary | ICD-10-CM | POA: Diagnosis present

## 2013-11-13 DIAGNOSIS — I2583 Coronary atherosclerosis due to lipid rich plaque: Secondary | ICD-10-CM

## 2013-11-13 DIAGNOSIS — Z79899 Other long term (current) drug therapy: Secondary | ICD-10-CM

## 2013-11-13 DIAGNOSIS — R0902 Hypoxemia: Secondary | ICD-10-CM

## 2013-11-13 DIAGNOSIS — R140 Abdominal distension (gaseous): Secondary | ICD-10-CM | POA: Diagnosis not present

## 2013-11-13 DIAGNOSIS — D638 Anemia in other chronic diseases classified elsewhere: Secondary | ICD-10-CM

## 2013-11-13 DIAGNOSIS — R0600 Dyspnea, unspecified: Secondary | ICD-10-CM | POA: Diagnosis not present

## 2013-11-13 DIAGNOSIS — R0602 Shortness of breath: Secondary | ICD-10-CM | POA: Diagnosis not present

## 2013-11-13 DIAGNOSIS — Z4659 Encounter for fitting and adjustment of other gastrointestinal appliance and device: Secondary | ICD-10-CM

## 2013-11-13 DIAGNOSIS — D62 Acute posthemorrhagic anemia: Secondary | ICD-10-CM | POA: Diagnosis present

## 2013-11-13 DIAGNOSIS — I2510 Atherosclerotic heart disease of native coronary artery without angina pectoris: Secondary | ICD-10-CM | POA: Diagnosis not present

## 2013-11-13 DIAGNOSIS — D563 Thalassemia minor: Secondary | ICD-10-CM | POA: Diagnosis present

## 2013-11-13 DIAGNOSIS — K56609 Unspecified intestinal obstruction, unspecified as to partial versus complete obstruction: Secondary | ICD-10-CM

## 2013-11-13 DIAGNOSIS — R109 Unspecified abdominal pain: Secondary | ICD-10-CM | POA: Diagnosis not present

## 2013-11-13 DIAGNOSIS — Z931 Gastrostomy status: Secondary | ICD-10-CM

## 2013-11-13 DIAGNOSIS — K5790 Diverticulosis of intestine, part unspecified, without perforation or abscess without bleeding: Secondary | ICD-10-CM | POA: Diagnosis not present

## 2013-11-13 DIAGNOSIS — A499 Bacterial infection, unspecified: Secondary | ICD-10-CM | POA: Diagnosis present

## 2013-11-13 LAB — CBC WITH DIFFERENTIAL/PLATELET
BASOS ABS: 0 10*3/uL (ref 0.0–0.1)
Basophils Relative: 0 % (ref 0–1)
EOS ABS: 0 10*3/uL (ref 0.0–0.7)
EOS PCT: 0 % (ref 0–5)
HCT: 32.6 % — ABNORMAL LOW (ref 39.0–52.0)
Hemoglobin: 10.6 g/dL — ABNORMAL LOW (ref 13.0–17.0)
LYMPHS ABS: 0.4 10*3/uL — AB (ref 0.7–4.0)
LYMPHS PCT: 2 % — AB (ref 12–46)
MCH: 21.5 pg — ABNORMAL LOW (ref 26.0–34.0)
MCHC: 32.5 g/dL (ref 30.0–36.0)
MCV: 66.1 fL — AB (ref 78.0–100.0)
Monocytes Absolute: 0.8 10*3/uL (ref 0.1–1.0)
Monocytes Relative: 5 % (ref 3–12)
NEUTROS PCT: 93 % — AB (ref 43–77)
Neutro Abs: 14.8 10*3/uL — ABNORMAL HIGH (ref 1.7–7.7)
Platelets: 125 10*3/uL — ABNORMAL LOW (ref 150–400)
RBC: 4.93 MIL/uL (ref 4.22–5.81)
RDW: 17.8 % — AB (ref 11.5–15.5)
WBC: 16 10*3/uL — ABNORMAL HIGH (ref 4.0–10.5)

## 2013-11-13 LAB — URINALYSIS, ROUTINE W REFLEX MICROSCOPIC
Bilirubin Urine: NEGATIVE
Glucose, UA: NEGATIVE mg/dL
Ketones, ur: NEGATIVE mg/dL
NITRITE: NEGATIVE
PH: 5 (ref 5.0–8.0)
Protein, ur: 30 mg/dL — AB
Specific Gravity, Urine: 1.019 (ref 1.005–1.030)
Urobilinogen, UA: 1 mg/dL (ref 0.0–1.0)

## 2013-11-13 LAB — URINE MICROSCOPIC-ADD ON

## 2013-11-13 LAB — COMPREHENSIVE METABOLIC PANEL
ALK PHOS: 50 U/L (ref 39–117)
ALT: 19 U/L (ref 0–53)
AST: 27 U/L (ref 0–37)
Albumin: 3.4 g/dL — ABNORMAL LOW (ref 3.5–5.2)
Anion gap: 20 — ABNORMAL HIGH (ref 5–15)
BUN: 63 mg/dL — ABNORMAL HIGH (ref 6–23)
CO2: 20 mEq/L (ref 19–32)
Calcium: 8.8 mg/dL (ref 8.4–10.5)
Chloride: 100 mEq/L (ref 96–112)
Creatinine, Ser: 2.08 mg/dL — ABNORMAL HIGH (ref 0.50–1.35)
GFR calc Af Amer: 32 mL/min — ABNORMAL LOW (ref >90)
GFR calc non Af Amer: 27 mL/min — ABNORMAL LOW (ref >90)
Glucose, Bld: 163 mg/dL — ABNORMAL HIGH (ref 70–99)
POTASSIUM: 4.7 meq/L (ref 3.7–5.3)
SODIUM: 140 meq/L (ref 137–147)
TOTAL PROTEIN: 7.3 g/dL (ref 6.0–8.3)
Total Bilirubin: 0.3 mg/dL (ref 0.3–1.2)

## 2013-11-13 LAB — BLOOD GAS, VENOUS
Acid-base deficit: 4.5 mmol/L — ABNORMAL HIGH (ref 0.0–2.0)
Bicarbonate: 22.6 mEq/L (ref 20.0–24.0)
O2 CONTENT: 8 L/min
O2 Saturation: 23.3 %
PCO2 VEN: 53.8 mmHg — AB (ref 45.0–50.0)
Patient temperature: 98.6
TCO2: 21.9 mmol/L (ref 0–100)
pH, Ven: 7.247 — ABNORMAL LOW (ref 7.250–7.300)

## 2013-11-13 LAB — TROPONIN I

## 2013-11-13 LAB — LIPASE, BLOOD: Lipase: 19 U/L (ref 11–59)

## 2013-11-13 LAB — I-STAT CG4 LACTIC ACID, ED: LACTIC ACID, VENOUS: 2.91 mmol/L — AB (ref 0.5–2.2)

## 2013-11-13 LAB — MRSA PCR SCREENING: MRSA by PCR: NEGATIVE

## 2013-11-13 MED ORDER — ONDANSETRON HCL 4 MG/2ML IJ SOLN
4.0000 mg | Freq: Four times a day (QID) | INTRAMUSCULAR | Status: DC | PRN
  Administered 2013-11-16 – 2013-11-17 (×2): 4 mg via INTRAVENOUS
  Filled 2013-11-13 (×2): qty 2

## 2013-11-13 MED ORDER — IPRATROPIUM-ALBUTEROL 0.5-2.5 (3) MG/3ML IN SOLN
3.0000 mL | RESPIRATORY_TRACT | Status: DC
  Administered 2013-11-13: 3 mL via RESPIRATORY_TRACT
  Filled 2013-11-13: qty 3

## 2013-11-13 MED ORDER — ASPIRIN EC 81 MG PO TBEC: 81.0000 mg | DELAYED_RELEASE_TABLET | Freq: Every day | ORAL | Status: DC

## 2013-11-13 MED ORDER — ACETAMINOPHEN 650 MG RE SUPP
650.0000 mg | Freq: Once | RECTAL | Status: AC
  Administered 2013-11-13: 650 mg via RECTAL
  Filled 2013-11-13: qty 1

## 2013-11-13 MED ORDER — HYDROMORPHONE HCL 1 MG/ML IJ SOLN
0.5000 mg | Freq: Once | INTRAMUSCULAR | Status: AC
Start: 2013-11-13 — End: 2013-11-13
  Administered 2013-11-13: 0.5 mg via INTRAVENOUS
  Filled 2013-11-13: qty 1

## 2013-11-13 MED ORDER — SODIUM CHLORIDE 0.9 % IV BOLUS (SEPSIS)
250.0000 mL | Freq: Once | INTRAVENOUS | Status: AC
  Administered 2013-11-13: 250 mL via INTRAVENOUS

## 2013-11-13 MED ORDER — BUDESONIDE 0.5 MG/2ML IN SUSP
0.5000 mg | Freq: Two times a day (BID) | RESPIRATORY_TRACT | Status: DC
  Administered 2013-11-13 – 2013-11-22 (×19): 0.5 mg via RESPIRATORY_TRACT
  Filled 2013-11-13 (×19): qty 2

## 2013-11-13 MED ORDER — ONDANSETRON HCL 4 MG/2ML IJ SOLN
4.0000 mg | Freq: Once | INTRAMUSCULAR | Status: AC
  Administered 2013-11-13: 4 mg via INTRAVENOUS
  Filled 2013-11-13: qty 2

## 2013-11-13 MED ORDER — ALBUTEROL SULFATE (2.5 MG/3ML) 0.083% IN NEBU
5.0000 mg | INHALATION_SOLUTION | Freq: Once | RESPIRATORY_TRACT | Status: DC
  Filled 2013-11-13: qty 6

## 2013-11-13 MED ORDER — HEPARIN SODIUM (PORCINE) 5000 UNIT/ML IJ SOLN
5000.0000 [IU] | Freq: Three times a day (TID) | INTRAMUSCULAR | Status: DC
  Administered 2013-11-13 – 2013-11-14 (×3): 5000 [IU] via SUBCUTANEOUS
  Filled 2013-11-13 (×3): qty 1

## 2013-11-13 MED ORDER — SODIUM CHLORIDE 0.9 % IV SOLN
INTRAVENOUS | Status: DC
  Administered 2013-11-13: 125 mL/h via INTRAVENOUS

## 2013-11-13 MED ORDER — ACETAMINOPHEN 650 MG RE SUPP: 650.0000 mg | Freq: Four times a day (QID) | RECTAL | Status: DC | PRN

## 2013-11-13 MED ORDER — PIPERACILLIN-TAZOBACTAM 3.375 G IVPB
3.3750 g | Freq: Once | INTRAVENOUS | Status: AC
  Administered 2013-11-13: 3.375 g via INTRAVENOUS
  Filled 2013-11-13: qty 50

## 2013-11-13 MED ORDER — ALPRAZOLAM 0.5 MG PO TABS
0.5000 mg | ORAL_TABLET | Freq: Every evening | ORAL | Status: DC | PRN
  Administered 2013-11-16 – 2013-11-20 (×3): 0.5 mg via ORAL
  Filled 2013-11-13 (×4): qty 1

## 2013-11-13 MED ORDER — ARFORMOTEROL TARTRATE 15 MCG/2ML IN NEBU: 15.0000 ug | INHALATION_SOLUTION | Freq: Two times a day (BID) | RESPIRATORY_TRACT | Status: DC

## 2013-11-13 MED ORDER — CLOPIDOGREL BISULFATE 75 MG PO TABS
75.0000 mg | ORAL_TABLET | Freq: Every day | ORAL | Status: DC
  Administered 2013-11-13: 75 mg via ORAL
  Filled 2013-11-13: qty 1

## 2013-11-13 MED ORDER — TAMSULOSIN HCL 0.4 MG PO CAPS: 0.4000 mg | ORAL_CAPSULE | Freq: Every day | ORAL | Status: DC

## 2013-11-13 MED ORDER — CETYLPYRIDINIUM CHLORIDE 0.05 % MT LIQD
7.0000 mL | Freq: Two times a day (BID) | OROMUCOSAL | Status: DC
  Administered 2013-11-13 – 2013-11-21 (×17): 7 mL via OROMUCOSAL

## 2013-11-13 MED ORDER — IPRATROPIUM-ALBUTEROL 0.5-2.5 (3) MG/3ML IN SOLN
3.0000 mL | RESPIRATORY_TRACT | Status: DC
  Administered 2013-11-13 – 2013-11-22 (×51): 3 mL via RESPIRATORY_TRACT
  Filled 2013-11-13 (×52): qty 3

## 2013-11-13 MED ORDER — IPRATROPIUM BROMIDE 0.02 % IN SOLN
0.5000 mg | Freq: Once | RESPIRATORY_TRACT | Status: AC
  Administered 2013-11-13: 0.5 mg via RESPIRATORY_TRACT

## 2013-11-13 MED ORDER — ONDANSETRON HCL 4 MG PO TABS: 4.0000 mg | ORAL_TABLET | Freq: Four times a day (QID) | ORAL | Status: DC | PRN

## 2013-11-13 MED ORDER — ALBUTEROL (5 MG/ML) CONTINUOUS INHALATION SOLN
10.0000 mg/h | INHALATION_SOLUTION | RESPIRATORY_TRACT | Status: DC
  Administered 2013-11-13: 10 mg/h via RESPIRATORY_TRACT

## 2013-11-13 MED ORDER — PIPERACILLIN-TAZOBACTAM 3.375 G IVPB 30 MIN: 3.3750 g | Freq: Three times a day (TID) | INTRAVENOUS | Status: DC

## 2013-11-13 MED ORDER — ALBUTEROL SULFATE (2.5 MG/3ML) 0.083% IN NEBU
2.5000 mg | INHALATION_SOLUTION | RESPIRATORY_TRACT | Status: DC | PRN
  Administered 2013-11-13 – 2013-11-19 (×8): 2.5 mg via RESPIRATORY_TRACT
  Filled 2013-11-13 (×8): qty 3

## 2013-11-13 MED ORDER — FENTANYL CITRATE 0.05 MG/ML IJ SOLN
50.0000 ug | Freq: Once | INTRAMUSCULAR | Status: AC
  Administered 2013-11-13: 50 ug via INTRAVENOUS
  Filled 2013-11-13: qty 2

## 2013-11-13 MED ORDER — PANTOPRAZOLE SODIUM 40 MG IV SOLR
40.0000 mg | Freq: Every day | INTRAVENOUS | Status: DC
Start: 2013-11-13 — End: 2013-11-14
  Administered 2013-11-13: 40 mg via INTRAVENOUS
  Filled 2013-11-13: qty 40

## 2013-11-13 MED ORDER — SODIUM CHLORIDE 0.9 % IV SOLN
INTRAVENOUS | Status: DC
  Administered 2013-11-13 – 2013-11-15 (×3): via INTRAVENOUS

## 2013-11-13 MED ORDER — FENTANYL CITRATE 0.05 MG/ML IJ SOLN
100.0000 ug | Freq: Once | INTRAMUSCULAR | Status: AC
  Administered 2013-11-13: 100 ug via INTRAVENOUS
  Filled 2013-11-13: qty 2

## 2013-11-13 MED ORDER — IPRATROPIUM BROMIDE 0.02 % IN SOLN
0.5000 mg | Freq: Once | RESPIRATORY_TRACT | Status: DC
  Filled 2013-11-13: qty 2.5

## 2013-11-13 MED ORDER — ALBUTEROL (5 MG/ML) CONTINUOUS INHALATION SOLN
10.0000 mg/h | INHALATION_SOLUTION | RESPIRATORY_TRACT | Status: DC
  Administered 2013-11-13: 10 mg/h via RESPIRATORY_TRACT
  Filled 2013-11-13: qty 20

## 2013-11-13 MED ORDER — SODIUM CHLORIDE 0.9 % IV BOLUS (SEPSIS): 1000.0000 mL | Freq: Once | INTRAVENOUS | Status: DC

## 2013-11-13 MED ORDER — PIPERACILLIN-TAZOBACTAM 3.375 G IVPB
3.3750 g | Freq: Three times a day (TID) | INTRAVENOUS | Status: DC
  Administered 2013-11-13 – 2013-11-21 (×24): 3.375 g via INTRAVENOUS
  Filled 2013-11-13 (×25): qty 50

## 2013-11-13 MED ORDER — HYDROMORPHONE HCL 1 MG/ML IJ SOLN
0.5000 mg | INTRAMUSCULAR | Status: DC | PRN
  Administered 2013-11-14 – 2013-11-17 (×7): 0.5 mg via INTRAVENOUS
  Filled 2013-11-13 (×7): qty 1

## 2013-11-13 MED ORDER — VITAMINS A & D EX OINT
TOPICAL_OINTMENT | CUTANEOUS | Status: AC
  Administered 2013-11-13: 5
  Filled 2013-11-13: qty 5

## 2013-11-13 MED ORDER — ACETAMINOPHEN 325 MG PO TABS: 650.0000 mg | ORAL_TABLET | Freq: Four times a day (QID) | ORAL | Status: DC | PRN

## 2013-11-13 MED ORDER — SODIUM CHLORIDE 0.9 % IJ SOLN
3.0000 mL | Freq: Two times a day (BID) | INTRAMUSCULAR | Status: DC
  Administered 2013-11-13: 3 mL via INTRAVENOUS
  Administered 2013-11-14: 09:00:00 via INTRAVENOUS
  Administered 2013-11-14 – 2013-11-20 (×11): 3 mL via INTRAVENOUS

## 2013-11-13 MED ORDER — ARFORMOTEROL TARTRATE 15 MCG/2ML IN NEBU
15.0000 ug | INHALATION_SOLUTION | Freq: Two times a day (BID) | RESPIRATORY_TRACT | Status: DC
  Administered 2013-11-13 – 2013-11-22 (×17): 15 ug via RESPIRATORY_TRACT
  Filled 2013-11-13 (×20): qty 2

## 2013-11-13 MED ORDER — IPRATROPIUM-ALBUTEROL 0.5-2.5 (3) MG/3ML IN SOLN: 3.0000 mL | RESPIRATORY_TRACT | Status: DC | PRN

## 2013-11-13 MED ORDER — SODIUM CHLORIDE 0.9 % IV BOLUS (SEPSIS): 500.0000 mL | Freq: Once | INTRAVENOUS | Status: DC

## 2013-11-13 MED ORDER — SODIUM CHLORIDE 0.9 % IV SOLN
1.0000 g | INTRAVENOUS | Status: DC
  Administered 2013-11-13: 1 g via INTRAVENOUS
  Filled 2013-11-13: qty 1

## 2013-11-13 MED ORDER — ALBUMIN HUMAN 5 % IV SOLN
25.0000 g | Freq: Once | INTRAVENOUS | Status: AC
  Administered 2013-11-13: 25 g via INTRAVENOUS
  Filled 2013-11-13: qty 500

## 2013-11-13 NOTE — Progress Notes (Signed)
Patient ID: [name] [name], male   DOB: 1925/09/14, 80 y.o.   MRN: [id number] Long social visit with Dr [name] and family today. He is a remarkable survivor of sudden death in 05/27/2009 with prolonged down time He has stable CAD with 80 yo stents and plavix can be stopped if potential surgery needed Cardiac status is stable.  COPD is significant  Worrisome increase in Cr and lactate levels  Indicated before that patient should not have "elective" surgery if possible  However I would rather have him attempt surgical repair of perforated colon rather than get intubated with multisystem failure with conservative Rx.  Agree that prognosis is poor either way   Family gets very upset with physicians who look at patient as 80 yo who should be a DNR.  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  Will try to speak with Dr Zella Richer tomorrow  I can be reached at [place]

## 2013-11-13 NOTE — Progress Notes (Signed)
The Pharmacist called about the patient have Brovana and Duoneb ordered on a schedule. I spoke with the patient and he says he takes brovana and pulmicort at home Bid. I changed duoneb order to prn. He is wheezing and was treated with duoneb at 1200 round. Will give duoneb if needed at 1600 round. Garlon Hatchet will start tonight at 2000. The patient is aware of this change.

## 2013-11-13 NOTE — Progress Notes (Signed)
I went to speak with family again at 89.  Nurses Edd Arbour and [place] were present.   Family is still undecided about DNR/DNI status.  I discussed what this would entail.  They expressed understanding.   Patient presently remains FULL CODE. Case discussed again with Dr. Maryln Gottron there are no plans for surgical intervention at this time, he stated it was okay to give patient plavix and ASA.  DTat

## 2013-11-13 NOTE — ED Provider Notes (Addendum)
CSN: [id number]     Arrival date & time 11/13/13  0409 History   First MD Initiated Contact with Patient 11/13/13 0430     Chief Complaint  Patient presents with  . Abdominal Pain   (Consider location/radiation/quality/duration/timing/severity/associated sxs/prior Treatment) HPI This is an 80 year old male with COPD and multiple other medical conditions. He was seen 2 days ago for COPD exacerbation. Admission was recommended but he chose to be discharged home instead. He has chronic urinary retention and self catheterizes. He was noted to have a urinary tract infection and was discharged on ciprofloxacin and prednisone. The hand and scrotal edema he had on his prior visit had improved significantly. He ate dinner yesterday evening and went to bed.   He returns this morning with 2 hours of abdominal pain and abdominal distention. The pain is moderate to severe and worse with palpation and breathing. He tried a suppository and Prilosec without relief, the pain gradually worsening since onset. He has not been nauseated or vomiting. His last bowel movement was yesterday morning. He is also acutely short of breath with tachypnea and wheezing, made worse by his abdominal pain.  Past Medical History  Diagnosis Date  . CAD (coronary artery disease)   . Ischemic cardiomyopathy   . CHF (congestive heart failure)   . Dyslipidemia   . Diverticulosis of colon (without mention of hemorrhage)   . Diabetes mellitus   . Anemia, unspecified   . Unspecified disorder resulting from impaired renal function   . Chronic airway obstruction, not elsewhere classified     on o2 at night  . Prostatic hypertrophy     hx of  . Retention of urine, unspecified   . ICD (implantable cardiac defibrillator) in place     st jude  . Emphysema   . Hemorrhoids   . MI, old 2010 or 2011  . Macular degeneration   . Cataracts, bilateral    Past Surgical History  Procedure Laterality Date  . Pci      4/11  . Cardiac  defibrillator placement  2011    st jude  . Pacemaker insertion  2011  . Tonsillectomy  80 yo  . Cataract extraction     Family History  Problem Relation Age of Onset  . Lung cancer Father     smoker  . Coronary artery disease Mother   . Stroke Mother   . Breast cancer Sister   . Atrial fibrillation Daughter   . Congestive Heart Failure Mother    History  Substance Use Topics  . Smoking status: Former Smoker    Types: Cigarettes    Quit date: 04/27/2009  . Smokeless tobacco: Never Used  . Alcohol Use: Yes     Comment: occasional beer    Review of Systems  All other systems reviewed and are negative.   Allergies  Review of patient's allergies indicates no known allergies.  Home Medications   Prior to Admission medications   Medication Sig Start Date End Date Taking? Authorizing Provider  albuterol (PROVENTIL) (5 MG/ML) 0.5% nebulizer solution Take 2.5 mg by nebulization every 6 (six) hours as needed for wheezing or shortness of breath.   Yes Historical Provider, MD  ALPRAZolam Duanne Moron) 0.5 MG tablet Take 0.5 mg by mouth at bedtime as needed for sleep.    Yes Historical Provider, MD  aspirin EC 81 MG tablet Take 81 mg by mouth at bedtime.    Yes Historical Provider, MD  budesonide (PULMICORT) 0.5 MG/2ML nebulizer solution Take 2  mLs (0.5 mg total) by nebulization 2 (two) times daily. 08/31/12  Yes Tammy S Parrett, NP  ciprofloxacin (CIPRO) 500 MG tablet Take 1 tablet (500 mg total) by mouth 2 (two) times daily. 11/11/13  Yes Mariea Clonts, MD  clopidogrel (PLAVIX) 75 MG tablet Take 75 mg by mouth every morning.    Yes Historical Provider, MD  famotidine (PEPCID) 20 MG tablet Take 20 mg by mouth at bedtime.  08/11/12  Yes Historical Provider, MD  losartan (COZAAR) 25 MG tablet Take 25 mg by mouth daily.   Yes Historical Provider, MD  metoprolol tartrate (LOPRESSOR) 25 MG tablet Take 25 mg by mouth 2 (two) times daily.   Yes Historical Provider, MD  predniSONE (DELTASONE) 20 MG  tablet Take 40 mg by mouth daily with breakfast.   Yes Historical Provider, MD  senna (SENOKOT) 8.6 MG TABS tablet Take 1 tablet by mouth daily.   Yes Historical Provider, MD  tamsulosin (FLOMAX) 0.4 MG CAPS capsule Take 1 capsule (0.4 mg total) by mouth daily. 07/04/13  Yes [name], MD  arformoterol (BROVANA) 15 MCG/2ML NEBU Take 2 mLs (15 mcg total) by nebulization 2 (two) times daily. 08/31/12   Tammy S Parrett, NP  nitroGLYCERIN (NITROSTAT) 0.4 MG SL tablet Place 1 tablet (0.4 mg total) under the tongue every 5 (five) minutes as needed for chest pain. 06/16/13 02/26/15  Thompson Grayer, MD   BP 88/50  Pulse 106  Temp(Src) 98.4 F (36.9 C) (Oral)  Resp 29  SpO2 96%  Physical Exam General: Well-developed, well-nourished male; appearance consistent with age of record HENT: normocephalic; atraumatic Eyes: pupils equal, round and reactive to light; extraocular muscles intact Neck: supple Heart: regular rate and rhythm; tachycardia Lungs: Inspiratory and expiratory wheezing; tachypnea Abdomen: Distended; tense; diffusely tender; bowel sounds diminished Extremities: Arthritic changes; pulses normal; no edema Neurologic: Awake, alert and oriented; motor function intact in all extremities and symmetric; no facial droop Skin: Warm and dry Psychiatric: Mildly agitated    ED Course  Procedures (including critical care time)  CRITICAL CARE Performed by: Anahli Arvanitis L Total critical care time: 45 minutes Critical care time was exclusive of separately billable procedures and treating other patients. Critical care was necessary to treat or prevent imminent or life-threatening deterioration. Critical care was time spent personally by me on the following activities: development of treatment plan with patient and/or surrogate as well as nursing, discussions with consultants, evaluation of patient's response to treatment, examination of patient, obtaining history from patient or surrogate, ordering  and performing treatments and interventions, ordering and review of laboratory studies, ordering and review of radiographic studies, pulse oximetry and re-evaluation of patient's condition.    EKG Interpretation   Date/Time:  Sunday November 13 2013 04:35:46 EDT Ventricular Rate:  95 PR Interval:  148 QRS Duration: 95 QT Interval:  346 QTC Calculation: 435 R Axis:   75 Text Interpretation:  Normal sinus rhythm No significant change was found  Confirmed by Kennya Schwenn  MD, Jenny Reichmann ([id number]) on 11/13/2013 4:58:23 AM      MDM  Neb treatment initiated by RT after arrival to room. Stat chest and abdominal films obtained without evidence of pneumonia or bowel obstruction.    Nursing notes and vitals signs, including pulse oximetry, reviewed.  Summary of this visit's results, reviewed by myself:  Labs:  Results for orders placed during the hospital encounter of 11/13/13 (from the past 24 hour(s))  I-STAT CG4 LACTIC ACID, ED     Status: Abnormal   Collection  Time    11/13/13  4:52 AM      Result Value Ref Range   Lactic Acid, Venous 2.91 (*) 0.5 - 2.2 mmol/L  CBC WITH DIFFERENTIAL     Status: Abnormal   Collection Time    11/13/13  4:55 AM      Result Value Ref Range   WBC 16.0 (*) 4.0 - 10.5 K/uL   RBC 4.93  4.22 - 5.81 MIL/uL   Hemoglobin 10.6 (*) 13.0 - 17.0 g/dL   HCT 32.6 (*) 39.0 - 52.0 %   MCV 66.1 (*) 78.0 - 100.0 fL   MCH 21.5 (*) 26.0 - 34.0 pg   MCHC 32.5  30.0 - 36.0 g/dL   RDW 17.8 (*) 11.5 - 15.5 %   Platelets 125 (*) 150 - 400 K/uL   Neutrophils Relative % 93 (*) 43 - 77 %   Neutro Abs 14.8 (*) 1.7 - 7.7 K/uL   Lymphocytes Relative 2 (*) 12 - 46 %   Lymphs Abs 0.4 (*) 0.7 - 4.0 K/uL   Monocytes Relative 5  3 - 12 %   Monocytes Absolute 0.8  0.1 - 1.0 K/uL   Eosinophils Relative 0  0 - 5 %   Eosinophils Absolute 0.0  0.0 - 0.7 K/uL   Basophils Relative 0  0 - 1 %   Basophils Absolute 0.0  0.0 - 0.1 K/uL  COMPREHENSIVE METABOLIC PANEL     Status: Abnormal    Collection Time    11/13/13  4:55 AM      Result Value Ref Range   Sodium 140  137 - 147 mEq/L   Potassium 4.7  3.7 - 5.3 mEq/L   Chloride 100  96 - 112 mEq/L   CO2 20  19 - 32 mEq/L   Glucose, Bld 163 (*) 70 - 99 mg/dL   BUN 63 (*) 6 - 23 mg/dL   Creatinine, Ser 2.08 (*) 0.50 - 1.35 mg/dL   Calcium 8.8  8.4 - 10.5 mg/dL   Total Protein 7.3  6.0 - 8.3 g/dL   Albumin 3.4 (*) 3.5 - 5.2 g/dL   AST 27  0 - 37 U/L   ALT 19  0 - 53 U/L   Alkaline Phosphatase 50  39 - 117 U/L   Total Bilirubin 0.3  0.3 - 1.2 mg/dL   GFR calc non Af Amer 27 (*) >90 mL/min   GFR calc Af Amer 32 (*) >90 mL/min   Anion gap 20 (*) 5 - 15  LIPASE, BLOOD     Status: None   Collection Time    11/13/13  4:55 AM      Result Value Ref Range   Lipase 19  11 - 59 U/L  TROPONIN I     Status: None   Collection Time    11/13/13  4:56 AM      Result Value Ref Range   Troponin I <0.30  <0.30 ng/mL  BLOOD GAS, VENOUS     Status: Abnormal   Collection Time    11/13/13  5:01 AM      Result Value Ref Range   O2 Content 8.0     Delivery systems AEROSOL MASK     pH, Ven 7.247 (*) 7.250 - 7.300   pCO2, Ven 53.8 (*) 45.0 - 50.0 mmHg   pO2, Ven BELOW REPORTABLE RANGE  30.0 - 45.0 mmHg   Bicarbonate 22.6  20.0 - 24.0 mEq/L   TCO2 21.9  0 - 100  mmol/L   Acid-base deficit 4.5 (*) 0.0 - 2.0 mmol/L   O2 Saturation 23.3     Patient temperature 98.6     Collection site VEIN     Drawn by [place]     Sample type VEIN    URINALYSIS, ROUTINE W REFLEX MICROSCOPIC     Status: Abnormal   Collection Time    11/13/13  6:50 AM      Result Value Ref Range   Color, Urine YELLOW  YELLOW   APPearance CLOUDY (*) CLEAR   Specific Gravity, Urine 1.019  1.005 - 1.030   pH 5.0  5.0 - 8.0   Glucose, UA NEGATIVE  NEGATIVE mg/dL   Hgb urine dipstick TRACE (*) NEGATIVE   Bilirubin Urine NEGATIVE  NEGATIVE   Ketones, ur NEGATIVE  NEGATIVE mg/dL   Protein, ur 30 (*) NEGATIVE mg/dL   Urobilinogen, UA 1.0  0.0 - 1.0 mg/dL    Nitrite NEGATIVE  NEGATIVE   Leukocytes, UA SMALL (*) NEGATIVE  URINE MICROSCOPIC-ADD ON     Status: Abnormal   Collection Time    11/13/13  6:50 AM      Result Value Ref Range   WBC, UA 0-2  <3 WBC/hpf   RBC / HPF 0-2  <3 RBC/hpf   Bacteria, UA MANY (*) RARE   Urine-Other AMORPHOUS URATES/PHOSPHATES      Imaging Studies: Ct Abdomen Pelvis Wo Contrast  11/13/2013   CLINICAL DATA:  Abdominal pain and distention.  Initial encounter  EXAM: CT ABDOMEN AND PELVIS WITHOUT CONTRAST  TECHNIQUE: Multidetector CT imaging of the abdomen and pelvis was performed following the standard protocol without IV contrast.  COMPARISON:  07/03/2013  FINDINGS: BODY WALL: Unremarkable.  LOWER CHEST: Pacer leads are present, but only partially visualized.  ABDOMEN/PELVIS:  Liver: No focal abnormality.  Biliary: Cholelithiasis.  No acute cholecystitis identified.  Pancreas: Unremarkable.  Spleen: Unremarkable.  Adrenals: Unremarkable.  Kidneys and ureters: Bilateral renal masses of varying density are stable compared to recent imaging. Most of these are simple or hemorrhagic cyst based on renal ultrasound 07/03/2013, although further characterization was recommended for a septated or nodular cyst. The previously noted right ureteral calculus has passed. No hydronephrosis.  Bladder: Unremarkable.  Reproductive: Enlarged prostate, deforming the bladder base.  Bowel: Inflammation surrounds a focally thickened sigmoid diverticulum consistent with diverticulitis. The diverticulitis is complicated by free perforation with scattered pneumoperitoneum throughout the anti dependent abdomen. There is no evidence for fluid collection. There is close proximity between the diverticulitis and the posterior bladder. No pneumaturia or bladder wall thickening to suggest fistulization has occurred. No bowel obstruction. Negative appendix.  Retroperitoneum: No mass or adenopathy.  Peritoneum: Small to moderate pneumoperitoneum as above.   Vascular: No acute abnormality.  OSSEOUS: Osteopenia with numerous tiny lucencies throughout the imaged skeleton. No acute fracture.  Critical Value/emergent results were called by telephone at the time of interpretation on 11/13/2013 at 6:56 am to Dr. Shanon Rosser , who verbally acknowledged these results.  IMPRESSION: 1. Sigmoid diverticulitis complicated by free perforation. Pneumoperitoneum is small to moderate volume. 2. Bilateral complex renal cysts, reference imaging recommendations from 07/03/2013. 3. Tiny lucencies throughout the skeleton is likely from the patient's significant osteopenia. Myeloma is the main differential consideration, after convalescence consider serum electrophoresis. 4. Cholelithiasis.   Electronically Signed   By: Jorje Guild M.D.   On: 11/13/2013 07:00   Dg Chest 2 View  11/11/2013   CLINICAL DATA:  Initial evaluation for weakness, increased shortness  of breath, current history of urinary tract infection, testicular pain, history of coronary artery disease congestive heart failure diabetes myocardial infarction, former smoker  EXAM: CHEST  2 VIEW  COMPARISON:  07/01/2013  FINDINGS: Hyperinflation consistent with COPD. Stable cardiac pacer. Uncoiling of the aorta. Vascular pattern normal. No consolidation or effusion. No significant change when compared to the prior study.  IMPRESSION: COPD with no acute findings   Electronically Signed   By: Skipper Cliche M.D.   On: 11/11/2013 21:57   Dg Chest Port 1 View  11/13/2013   CLINICAL DATA:  Abdominal pain beginning after dinner. Acute onset shortness of breath while in triage.  EXAM: PORTABLE CHEST - 1 VIEW  COMPARISON:  Chest radiograph November 11, 2013  FINDINGS: Cardiac silhouette is nonenlarged, similar appearing tortuous mildly calcified aorta. No pleural effusions or focal consolidations. Slightly increased lung volumes may reflect COPD. Dual lead LEFT cardiac defibrillator in situ. No pneumothorax. Soft tissue planes  and included osseous structures are nonsuspicious.  IMPRESSION: No acute cardiopulmonary process ; stable appearance of the chest from November 11, 2013.   Electronically Signed   By: Elon Alas   On: 11/13/2013 05:02   Dg Abd Portable 1v  11/13/2013   CLINICAL DATA:  Abdominal distention.  Initial encounter  EXAM: PORTABLE ABDOMEN - 1 VIEW  COMPARISON:  07/13/2013  FINDINGS: No evidence of bowel obstruction. Stool volume is within normal limits. No concerning intra-abdominal mass effect or calcification. Visualized lung bases are grossly clear. Lumbar levoscoliosis with advanced degenerative disc disease. Patchy density of the pelvis is not a reliable finding given blurring artifact noted on both images.  IMPRESSION: Negative for bowel obstruction.   Electronically Signed   By: Jorje Guild M.D.   On: 11/13/2013 05:06   6:54 AM Patient spiked a fever to 102. Rectal Tylenol administered. CT scan shows diverticulitis with perforation. Zosyn 3.375 g IV given. Patient continues to be normotensive but tachycardic. He remains awake and alert. Dyspnea improved since arrival.  7:33 AM [facility] to admit. Dr. Zella Richer of surgery consulted.  Final diagnoses:  Diverticulitis of colon with perforation  COPD exacerbation  Renal insufficiency    Wynetta Fines, MD 11/13/13 3112  Wynetta Fines, MD 11/13/13 [phone]

## 2013-11-13 NOTE — ED Notes (Signed)
Patient transported to CT 

## 2013-11-13 NOTE — ED Notes (Signed)
Both Drs. Tat and Rosenbower had an extensive consultation with the family and pt.; and Dr. Ashok Cordia had a subsequent consultation with them also.  So far, pt. Is awake, alert and oriented x 4.  He is short of breath, and c/o discomfort regarding his abd. Distention.  I have just called report to [place], Rn in ICU and will transport after pain med. Given.

## 2013-11-13 NOTE — H&P (Addendum)
History and Physical  [name] [name] UDJ:[id number] DOB: Dec 07, 1925 DOA: 11/13/2013   PCP: [name], MD   Chief Complaint: abdominal pain and sob  HPI:  80 year old male with a history of sudden cardiac death status post AICD, ischemic cardiomyopathy (EF31% by MRI), COPD, thalessemia presented with acute onset of abdominal pain on the morning of admission. This was associated with some abdominal distention. The patient had his last bowel movement 24 hours prior to this admission. The patient was in the emergency department on 11/11/2013. He was diagnosed with a COPD exacerbation. Admission was recommended, but the patient wanted to go home. He was sent home with prednisone and ciprofloxacin. He presented again today with shortness of breath that was not much better as well as abdominal pain. CT of the abdomen and pelvis in the emergency department revealed perforated diverticulitis with pneumoperitoneum. The patient was given a dose of Zosyn as well as fentanyl x2 IV and albuterol nebulizer. General surgery, Dr. Zella Richer was consulted to see the patient.  After he evaluated the patient, the surgical plan was to treat the patient medically without any surgical intervention. I discussed the case with Dr. Zella Richer whom stated that any form of IV or oral corticosteroids are contraindicated at this time. The patient continues to complain of shortness of breath but denies any chest pain, vomiting, diarrhea. He does have some fevers and chills. Labs in the emergency department revealed WBC 16.0, lactic acid 2.91, serum creatinine 2.08  , potassium 4.7, platelets 125,000. Chest x-ray did not reveal any acute findings.  Assessment/Plan: Sepsis -Present at the time of admission -secondary to perforated diverticulitis with peritonitis -IV fluids  -IV antibiotics  Diverticulitis/Peritonitis -pt has perforated diverticulitis -after discussion with Dr. Starr Lake has opted for  non-surgical therapy -IV abx -I dicussed w/case with Dr. Sandria Senter stated systemic steroids are contraindicated at this time Acute Respiratory Failure -secondary to COPD exacerbation -as discussed above--no systemic steroids -inhaled pulmicort and brovana -Patient is very high risk of intubation--this was discussed with the patient's family and at the time of my interview, the patient is full code -I have consulted critical care--spoke with Dr. Chase Caller COPD exacerbation -Inhaled pulmicort and brovana -aerosolized albuterol and atrovent Ischemic cardiomyopathy with history of sudden cardiac death -Since there are no plans for surgical intervention, we'll continue aspirin and Plavix -Status post AICD -Patient had stent to Ramus, circ and RCA  -Given number of stents will stay on Plavix indefinitely per cardiology -hold metoprolol and losartan due to soft BP Thrombocytopenia -Chronic after review of the medical record -Continue to monitor for bleeding complications CKD stage 3 -baseline creatinine 1.6-1/9        Past Medical History  Diagnosis Date  . CAD (coronary artery disease)   . Ischemic cardiomyopathy   . CHF (congestive heart failure)   . Dyslipidemia   . Diverticulosis of colon (without mention of hemorrhage)   . Diabetes mellitus   . Anemia, unspecified   . Unspecified disorder resulting from impaired renal function   . Chronic airway obstruction, not elsewhere classified     on o2 at night  . Prostatic hypertrophy     hx of  . Retention of urine, unspecified   . ICD (implantable cardiac defibrillator) in place     st jude  . Emphysema   . Hemorrhoids   . MI, old 2010 or 2011  . Macular degeneration   . Cataracts, bilateral    Past Surgical History  Procedure Laterality Date  .  Pci      4/11  . Cardiac defibrillator placement  2011    st jude  . Pacemaker insertion  2011  . Tonsillectomy  80 yo  . Cataract extraction     Social History:   reports that he quit smoking about 4 years ago. His smoking use included Cigarettes. He smoked 0.00 packs per day. He has never used smokeless tobacco. He reports that he drinks alcohol. He reports that he does not use illicit drugs.   Family History  Problem Relation Age of Onset  . Lung cancer Father     smoker  . Coronary artery disease Mother   . Stroke Mother   . Breast cancer Sister   . Atrial fibrillation Daughter   . Congestive Heart Failure Mother      No Known Allergies    Prior to Admission medications   Medication Sig Start Date End Date Taking? Authorizing Provider  albuterol (PROVENTIL) (5 MG/ML) 0.5% nebulizer solution Take 2.5 mg by nebulization every 6 (six) hours as needed for wheezing or shortness of breath.   Yes Historical Provider, MD  ALPRAZolam Duanne Moron) 0.5 MG tablet Take 0.5 mg by mouth at bedtime as needed for sleep.    Yes Historical Provider, MD  aspirin EC 81 MG tablet Take 81 mg by mouth at bedtime.    Yes Historical Provider, MD  budesonide (PULMICORT) 0.5 MG/2ML nebulizer solution Take 2 mLs (0.5 mg total) by nebulization 2 (two) times daily. 08/31/12  Yes Tammy S Parrett, NP  ciprofloxacin (CIPRO) 500 MG tablet Take 1 tablet (500 mg total) by mouth 2 (two) times daily. 11/11/13  Yes Mariea Clonts, MD  clopidogrel (PLAVIX) 75 MG tablet Take 75 mg by mouth every morning.    Yes Historical Provider, MD  famotidine (PEPCID) 20 MG tablet Take 20 mg by mouth at bedtime.  08/11/12  Yes Historical Provider, MD  losartan (COZAAR) 25 MG tablet Take 25 mg by mouth daily.   Yes Historical Provider, MD  metoprolol tartrate (LOPRESSOR) 25 MG tablet Take 25 mg by mouth 2 (two) times daily.   Yes Historical Provider, MD  predniSONE (DELTASONE) 20 MG tablet Take 40 mg by mouth daily with breakfast.   Yes Historical Provider, MD  senna (SENOKOT) 8.6 MG TABS tablet Take 1 tablet by mouth daily.   Yes Historical Provider, MD  tamsulosin (FLOMAX) 0.4 MG CAPS capsule Take 1  capsule (0.4 mg total) by mouth daily. 07/04/13  Yes [name], MD  arformoterol (BROVANA) 15 MCG/2ML NEBU Take 2 mLs (15 mcg total) by nebulization 2 (two) times daily. 08/31/12   Tammy S Parrett, NP  nitroGLYCERIN (NITROSTAT) 0.4 MG SL tablet Place 1 tablet (0.4 mg total) under the tongue every 5 (five) minutes as needed for chest pain. 06/16/13 02/26/15  Thompson Grayer, MD    Review of Systems:  Constitutional:  No weight loss, night sweats  Head&Eyes: No headache.  No vision loss.  No eye pain or scotoma ENT:  No Difficulty swallowing,Tooth/dental problems,Sore throat,   Cardio-vascular:  No chest pain, Orthopnea, PND, swelling in lower extremities,  dizziness, palpitations  GI:  No   nausea, vomiting, diarrhea, loss of appetite, hematochezia, melena, heartburn, indigestion, Resp:   No chest wall deformity  Skin:  no rash or lesions.  GU:  no dysuria, change in color of urine, no urgency or frequency. No flank pain.  Musculoskeletal:  No joint pain or swelling. No decreased range of motion. Psych:  No change in  mood or affect.  Neurologic: No headache, no dysesthesia, no focal weakness, no vision loss. No syncope  Physical Exam: Filed Vitals:   11/13/13 0530 11/13/13 0605 11/13/13 0736 11/13/13 0836  BP: 135/78 134/108 99/52 88/50  Pulse: 117 119 102 106  Temp:  102 F (38.9 C) 98.4 F (36.9 C)   TempSrc:  Rectal Oral   Resp: 22 29 26 29   SpO2: 100% 94% 95% 96%   General:  A&O x 3, toxic, pleasant/cooperative, mild extremis Head/Eye: No conjunctival hemorrhage, no icterus, [place]/AT, No nystagmus ENT:  No icterus,  No thrush,no pharyngeal exudate Neck:  No masses, no lymphadenpathy, no bruits CV:  RRR, no rub Lung:  Bilateral scattered rhonchi with expiratory wheeze Abdomen: soft/diffusely tender with guarding and rebound. Ext: No cyanosis, No rashes, No petechiae, No lymphangitis, No edema Neuro: CNII-XII intact, strength 4/5 in bilateral upper and lower extremities,    Labs on Admission:  Basic Metabolic Panel:  Recent Labs Lab 11/11/13 2139 11/13/13 0455  NA 139 140  K 4.4 4.7  CL 100 100  CO2 23 20  GLUCOSE 101* 163*  BUN 33* 63*  CREATININE 1.69* 2.08*  CALCIUM 8.5 8.8   Liver Function Tests:  Recent Labs Lab 11/13/13 0455  AST 27  ALT 19  ALKPHOS 50  BILITOT 0.3  PROT 7.3  ALBUMIN 3.4*    Recent Labs Lab 11/13/13 0455  LIPASE 19   No results found for this basename: AMMONIA,  in the last 168 hours CBC:  Recent Labs Lab 11/11/13 2139 11/13/13 0455  WBC 12.8* 16.0*  NEUTROABS 11.4* 14.8*  HGB 9.2* 10.6*  HCT 28.8* 32.6*  MCV 67.0* 66.1*  PLT 82* 125*   Cardiac Enzymes:  Recent Labs Lab 11/11/13 2139 11/13/13 0456  TROPONINI <0.30 <0.30   BNP: No components found with this basename: POCBNP,  CBG: No results found for this basename: GLUCAP,  in the last 168 hours  Radiological Exams on Admission: Ct Abdomen Pelvis Wo Contrast  11/13/2013   CLINICAL DATA:  Abdominal pain and distention.  Initial encounter  EXAM: CT ABDOMEN AND PELVIS WITHOUT CONTRAST  TECHNIQUE: Multidetector CT imaging of the abdomen and pelvis was performed following the standard protocol without IV contrast.  COMPARISON:  07/03/2013  FINDINGS: BODY WALL: Unremarkable.  LOWER CHEST: Pacer leads are present, but only partially visualized.  ABDOMEN/PELVIS:  Liver: No focal abnormality.  Biliary: Cholelithiasis.  No acute cholecystitis identified.  Pancreas: Unremarkable.  Spleen: Unremarkable.  Adrenals: Unremarkable.  Kidneys and ureters: Bilateral renal masses of varying density are stable compared to recent imaging. Most of these are simple or hemorrhagic cyst based on renal ultrasound 07/03/2013, although further characterization was recommended for a septated or nodular cyst. The previously noted right ureteral calculus has passed. No hydronephrosis.  Bladder: Unremarkable.  Reproductive: Enlarged prostate, deforming the bladder base.  Bowel:  Inflammation surrounds a focally thickened sigmoid diverticulum consistent with diverticulitis. The diverticulitis is complicated by free perforation with scattered pneumoperitoneum throughout the anti dependent abdomen. There is no evidence for fluid collection. There is close proximity between the diverticulitis and the posterior bladder. No pneumaturia or bladder wall thickening to suggest fistulization has occurred. No bowel obstruction. Negative appendix.  Retroperitoneum: No mass or adenopathy.  Peritoneum: Small to moderate pneumoperitoneum as above.  Vascular: No acute abnormality.  OSSEOUS: Osteopenia with numerous tiny lucencies throughout the imaged skeleton. No acute fracture.  Critical Value/emergent results were called by telephone at the time of interpretation on 11/13/2013 at 6:56  am to Dr. Shanon Rosser , who verbally acknowledged these results.  IMPRESSION: 1. Sigmoid diverticulitis complicated by free perforation. Pneumoperitoneum is small to moderate volume. 2. Bilateral complex renal cysts, reference imaging recommendations from 07/03/2013. 3. Tiny lucencies throughout the skeleton is likely from the patient's significant osteopenia. Myeloma is the main differential consideration, after convalescence consider serum electrophoresis. 4. Cholelithiasis.   Electronically Signed   By: Jorje Guild M.D.   On: 11/13/2013 07:00   Dg Chest 2 View  11/11/2013   CLINICAL DATA:  Initial evaluation for weakness, increased shortness of breath, current history of urinary tract infection, testicular pain, history of coronary artery disease congestive heart failure diabetes myocardial infarction, former smoker  EXAM: CHEST  2 VIEW  COMPARISON:  07/01/2013  FINDINGS: Hyperinflation consistent with COPD. Stable cardiac pacer. Uncoiling of the aorta. Vascular pattern normal. No consolidation or effusion. No significant change when compared to the prior study.  IMPRESSION: COPD with no acute findings    Electronically Signed   By: Skipper Cliche M.D.   On: 11/11/2013 21:57   Dg Chest Port 1 View  11/13/2013   CLINICAL DATA:  Abdominal pain beginning after dinner. Acute onset shortness of breath while in triage.  EXAM: PORTABLE CHEST - 1 VIEW  COMPARISON:  Chest radiograph November 11, 2013  FINDINGS: Cardiac silhouette is nonenlarged, similar appearing tortuous mildly calcified aorta. No pleural effusions or focal consolidations. Slightly increased lung volumes may reflect COPD. Dual lead LEFT cardiac defibrillator in situ. No pneumothorax. Soft tissue planes and included osseous structures are nonsuspicious.  IMPRESSION: No acute cardiopulmonary process ; stable appearance of the chest from November 11, 2013.   Electronically Signed   By: Elon Alas   On: 11/13/2013 05:02   Dg Abd Portable 1v  11/13/2013   CLINICAL DATA:  Abdominal distention.  Initial encounter  EXAM: PORTABLE ABDOMEN - 1 VIEW  COMPARISON:  07/13/2013  FINDINGS: No evidence of bowel obstruction. Stool volume is within normal limits. No concerning intra-abdominal mass effect or calcification. Visualized lung bases are grossly clear. Lumbar levoscoliosis with advanced degenerative disc disease. Patchy density of the pelvis is not a reliable finding given blurring artifact noted on both images.  IMPRESSION: Negative for bowel obstruction.   Electronically Signed   By: Jorje Guild M.D.   On: 11/13/2013 05:06    EKG: Independently reviewed. Sinus, no ST-T changes    Time spent:90 minutes Code Status:   FULL Family Communication:   Daughters at bedside   Yifan Auker, DO  [facility] Pager [phone]  If 7PM-7AM, please contact night-coverage [email or web address] Password [email or web address] 11/13/2013, 9:14 AM

## 2013-11-13 NOTE — ED Notes (Signed)
Pt arrived to the ED with a complaint of abdominal pain.  Pt states his symptoms started after dinner several hours ago.  He attempted medicine and an enema with no result.  Pt suddenly in triage became short of breath.

## 2013-11-13 NOTE — Consult Note (Signed)
PULMONARY / CRITICAL CARE MEDICINE   Name: [name] [name] MRN: [id number] DOB: April 01, 1925    ADMISSION DATE:  11/13/2013 CONSULTATION DATE:  11/13/2013   REFERRING MD :  Dr Shanon Brow [facility]  CHIEF COMPLAINT:  Respiratory Distress in setting of COPD,  Cardiomyopathy, and GI perf. 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   The patient was in the emergency department on 11/11/2013. He was diagnosed with a COPD exacerbation. Admission was recommended, but the patient wanted to go home. He was sent home with prednisone and ciprofloxacin. He presented again today with shortness of breath that was not much better as well as abdominal pain. CT of the abdomen and pelvis in the emergency department revealed perforated diverticulitis with pneumoperitoneum. The patient was given a dose of Zosyn as well as fentanyl x2 IV and albuterol nebulizer. General surgery, Dr. Zella Richer was consulted to see the patient. After he evaluated the patient, the surgical plan was to treat the patient medically without any surgical intervention.  In the ER patient noticed to be tachypneic and wheezing but surgery strongly recommended against steroids given GI perf and need to medically manage due to high medical risk for post operative complications.   Patient nearly got intubated but improvved after nebs, o2 and ? O2. PCCM asked to consult for resp failure mgmt  Family and patient undecided about code status     PAST MEDICAL HISTORY :   has a past medical history of CAD (coronary artery disease); Ischemic cardiomyopathy; CHF (congestive heart failure); Dyslipidemia; Diverticulosis of colon (without  mention of hemorrhage); Diabetes mellitus; Anemia, unspecified; Unspecified disorder resulting from impaired renal function; Chronic airway obstruction, not elsewhere classified; Prostatic hypertrophy; Retention of urine, unspecified; ICD (implantable cardiac defibrillator) in place; Emphysema; Hemorrhoids; MI, old (2010 or 2011); Macular degeneration; and Cataracts, bilateral.  has past surgical history that includes pci; Cardiac defibrillator placement (2011); Pacemaker insertion (2011); Tonsillectomy (80 yo); and Cataract extraction.   reports that he quit smoking about 4 years ago. His smoking use included Cigarettes. He smoked 0.00 packs per day. He has never used smokeless tobacco.  Prior to Admission medications   Medication Sig Start Date End Date Taking? Authorizing Provider  albuterol (PROVENTIL) (5 MG/ML) 0.5% nebulizer solution Take 2.5 mg by nebulization every 6 (six) hours as needed for wheezing or shortness of breath.   Yes Historical Provider, MD  ALPRAZolam Duanne Moron) 0.5 MG tablet Take 0.5 mg by mouth at bedtime as needed for sleep.    Yes Historical Provider, MD  aspirin EC 81 MG tablet Take 81 mg by mouth at bedtime.    Yes Historical Provider, MD  budesonide (PULMICORT) 0.5 MG/2ML nebulizer solution Take 2 mLs (0.5 mg total) by nebulization 2 (two) times daily. 08/31/12  Yes Tammy S Parrett, NP  ciprofloxacin (CIPRO) 500 MG tablet Take 1 tablet (500 mg total) by mouth 2 (two) times daily. 11/11/13  Yes Mariea Clonts, MD  clopidogrel (PLAVIX) 75 MG tablet Take 75 mg by mouth every morning.    Yes Historical Provider, MD  famotidine (PEPCID) 20 MG tablet Take 20 mg by mouth at bedtime.  08/11/12  Yes Historical Provider, MD  losartan (COZAAR) 25 MG tablet Take  25 mg by mouth daily.   Yes Historical Provider, MD  metoprolol tartrate (LOPRESSOR) 25 MG tablet Take 25 mg by mouth 2 (two) times daily.   Yes Historical Provider, MD  predniSONE (DELTASONE) 20 MG tablet Take 40 mg by mouth  daily with breakfast.   Yes Historical Provider, MD  senna (SENOKOT) 8.6 MG TABS tablet Take 1 tablet by mouth daily.   Yes Historical Provider, MD  tamsulosin (FLOMAX) 0.4 MG CAPS capsule Take 1 capsule (0.4 mg total) by mouth daily. 07/04/13  Yes [name], MD  arformoterol (BROVANA) 15 MCG/2ML NEBU Take 2 mLs (15 mcg total) by nebulization 2 (two) times daily. 08/31/12   Tammy S Parrett, NP  nitroGLYCERIN (NITROSTAT) 0.4 MG SL tablet Place 1 tablet (0.4 mg total) under the tongue every 5 (five) minutes as needed for chest pain. 06/16/13 02/26/15  Thompson Grayer, MD   No Known Allergies  FAMILY HISTORY:  has no family status information on file.  SOCIAL HISTORY:  reports that he quit smoking about 4 years ago. His smoking use included Cigarettes. He smoked 0.00 packs per day. He has never used smokeless tobacco. He reports that he drinks alcohol. He reports that he does not use illicit drugs.   SUBJECTIVE:   VITAL SIGNS: Temp:  [97.6 F (36.4 C)-102 F (38.9 C)] 97.6 F (36.4 C) (10/18 1213) Pulse Rate:  [90-119] 115 (10/18 1300) Resp:  [17-30] 26 (10/18 1300) BP: (88-151)/(31-108) 102/46 mmHg (10/18 1300) SpO2:  [79 %-100 %] 93 % (10/18 1300) Weight:  [84.1 kg (185 lb 6.5 oz)] 84.1 kg (185 lb 6.5 oz) (10/18 1000) HEMODYNAMICS:   VENTILATOR SETTINGS:   INTAKE / OUTPUT:  Intake/Output Summary (Last 24 hours) at 11/13/13 1316 Last data filed at 11/13/13 1200  Gross per 24 hour  Intake      0 ml  Output     60 ml  Net    -60 ml    PHYSICAL EXAMINATION: General:  Elderly male. No major distress Neuro:  A.xo3. Speech normal. Pleasant. Jovial HEENT:   Neck supple Cardiovascular:  HR 92. Normal heart sounds Lungs:  Mildly tachypneic.No resp acc muscle use. No wheezing  Abdomen:  Distended, mildly tender lower abdomen Musculoskeletal:  No cyanosis, No clubbing,. No edema Skin:  Intact anteriorly  LABS: PULMONARY  Recent Labs Lab 11/13/13 0501  HCO3 22.6  TCO2 21.9   O2SAT 23.3    CBC  Recent Labs Lab 11/11/13 2139 11/13/13 0455  HGB 9.2* 10.6*  HCT 28.8* 32.6*  WBC 12.8* 16.0*  PLT 82* 125*    COAGULATION No results found for this basename: INR,  in the last 168 hours  CARDIAC   Recent Labs Lab 11/11/13 2139 11/13/13 0456  TROPONINI <0.30 <0.30    Recent Labs Lab 11/11/13 2139  PROBNP 759.3*     CHEMISTRY  Recent Labs Lab 11/11/13 2139 11/13/13 0455  NA 139 140  K 4.4 4.7  CL 100 100  CO2 23 20  GLUCOSE 101* 163*  BUN 33* 63*  CREATININE 1.69* 2.08*  CALCIUM 8.5 8.8   Estimated Creatinine Clearance: 28 ml/min (by C-G formula based on Cr of 2.08).   LIVER  Recent Labs Lab 11/13/13 0455  AST 27  ALT 19  ALKPHOS 50  BILITOT 0.3  PROT 7.3  ALBUMIN 3.4*     INFECTIOUS  Recent Labs Lab 11/11/13 2149 11/13/13 0452  LATICACIDVEN 1.29 2.91*     ENDOCRINE CBG (last 3)  No results found for  this basename: GLUCAP,  in the last 72 hours       IMAGING x48h Ct Abdomen Pelvis Wo Contrast  11/13/2013   CLINICAL DATA:  Abdominal pain and distention.  Initial encounter  EXAM: CT ABDOMEN AND PELVIS WITHOUT CONTRAST  TECHNIQUE: Multidetector CT imaging of the abdomen and pelvis was performed following the standard protocol without IV contrast.  COMPARISON:  07/03/2013  FINDINGS: BODY WALL: Unremarkable.  LOWER CHEST: Pacer leads are present, but only partially visualized.  ABDOMEN/PELVIS:  Liver: No focal abnormality.  Biliary: Cholelithiasis.  No acute cholecystitis identified.  Pancreas: Unremarkable.  Spleen: Unremarkable.  Adrenals: Unremarkable.  Kidneys and ureters: Bilateral renal masses of varying density are stable compared to recent imaging. Most of these are simple or hemorrhagic cyst based on renal ultrasound 07/03/2013, although further characterization was recommended for a septated or nodular cyst. The previously noted right ureteral calculus has passed. No hydronephrosis.  Bladder:  Unremarkable.  Reproductive: Enlarged prostate, deforming the bladder base.  Bowel: Inflammation surrounds a focally thickened sigmoid diverticulum consistent with diverticulitis. The diverticulitis is complicated by free perforation with scattered pneumoperitoneum throughout the anti dependent abdomen. There is no evidence for fluid collection. There is close proximity between the diverticulitis and the posterior bladder. No pneumaturia or bladder wall thickening to suggest fistulization has occurred. No bowel obstruction. Negative appendix.  Retroperitoneum: No mass or adenopathy.  Peritoneum: Small to moderate pneumoperitoneum as above.  Vascular: No acute abnormality.  OSSEOUS: Osteopenia with numerous tiny lucencies throughout the imaged skeleton. No acute fracture.  Critical Value/emergent results were called by telephone at the time of interpretation on 11/13/2013 at 6:56 am to Dr. Shanon Rosser , who verbally acknowledged these results.  IMPRESSION: 1. Sigmoid diverticulitis complicated by free perforation. Pneumoperitoneum is small to moderate volume. 2. Bilateral complex renal cysts, reference imaging recommendations from 07/03/2013. 3. Tiny lucencies throughout the skeleton is likely from the patient's significant osteopenia. Myeloma is the main differential consideration, after convalescence consider serum electrophoresis. 4. Cholelithiasis.   Electronically Signed   By: Jorje Guild M.D.   On: 11/13/2013 07:00   Dg Chest 2 View  11/11/2013   CLINICAL DATA:  Initial evaluation for weakness, increased shortness of breath, current history of urinary tract infection, testicular pain, history of coronary artery disease congestive heart failure diabetes myocardial infarction, former smoker  EXAM: CHEST  2 VIEW  COMPARISON:  07/01/2013  FINDINGS: Hyperinflation consistent with COPD. Stable cardiac pacer. Uncoiling of the aorta. Vascular pattern normal. No consolidation or effusion. No significant change  when compared to the prior study.  IMPRESSION: COPD with no acute findings   Electronically Signed   By: Skipper Cliche M.D.   On: 11/11/2013 21:57   Dg Chest Port 1 View  11/13/2013   CLINICAL DATA:  Abdominal pain beginning after dinner. Acute onset shortness of breath while in triage.  EXAM: PORTABLE CHEST - 1 VIEW  COMPARISON:  Chest radiograph November 11, 2013  FINDINGS: Cardiac silhouette is nonenlarged, similar appearing tortuous mildly calcified aorta. No pleural effusions or focal consolidations. Slightly increased lung volumes may reflect COPD. Dual lead LEFT cardiac defibrillator in situ. No pneumothorax. Soft tissue planes and included osseous structures are nonsuspicious.  IMPRESSION: No acute cardiopulmonary process ; stable appearance of the chest from November 11, 2013.   Electronically Signed   By: Elon Alas   On: 11/13/2013 05:02   Dg Abd Portable 1v  11/13/2013   CLINICAL DATA:  Abdominal distention.  Initial encounter  EXAM: PORTABLE  ABDOMEN - 1 VIEW  COMPARISON:  07/13/2013  FINDINGS: No evidence of bowel obstruction. Stool volume is within normal limits. No concerning intra-abdominal mass effect or calcification. Visualized lung bases are grossly clear. Lumbar levoscoliosis with advanced degenerative disc disease. Patchy density of the pelvis is not a reliable finding given blurring artifact noted on both images.  IMPRESSION: Negative for bowel obstruction.   Electronically Signed   By: Jorje Guild M.D.   On: 11/13/2013 05:06         ASSESSMENT / PLAN:  PULMONARY OETT A:Hx of COPD. REcent AECOPD 11/11/13 and Rx with prednisone. REturned 11/13/2013 with sigmoid perf. [facility]CS does not advise steroids. Now in resp distress  P:   Sccheduled nebs Scheduled pulmicort Hold off systemic steroids Intubate if worse  CARDIOVASCULAR Chronic systlioc CHF with AICD A: BP/HR stable P:  Fluids per [facility]  RENAL A:  Acute on chronic renal insuff P:   Fluids per  [facility]  GASTROINTESTINAL A:  Sigmoid perf P:   Per [facility] and [facility]  HEMATOLOGIC A:  At risk for anemia P:  Per [facility]  INFECTIOUS A:  Sigmoid perf P:   Abx per [facility]   ENDOCRINE A:  At risk for hyperglycemia   P:   ssi pe [facility]  NEUROLOGIC A:  intact P:   monitor   FAMILY Updates:  Wife, daughter, patient, niece updated. Full Code for now Interdisciplinary Family Meeting:    Dr. Brand Males, M.D., [facility].C.P Pulmonary and Critical Care Medicine Staff Physician [facility] Pager: [phone], If no answer or between  15:00h - 7:00h: call [phone]  11/13/2013 1:43 PM

## 2013-11-13 NOTE — Consult Note (Signed)
Reason for Consult:  Diverticulitis with perforation Referring Physician:   Dr. Herbert Spires [name] [name] is an 80 y.o. male.  HPI: This is an 80 year old gentleman with multiple significant comorbidities who developed lower, pain last night that has progressively worsened. He also has had increasing difficulty breathing. He was in the emergency department 2 days ago with a COPD exacerbation as well as a UTI and refused admission. He was sent home on Cipro and steroid treatment. With respect to his abdominal pain, he attempted a suppository and a proton pump inhibitor but got no relief. He noticed his abdomen becoming more swollen. He presented to the emergency department. Dilation demonstrated a gentleman in respiratory distress. CT scan was performed which demonstrates a focal area of sigmoid diverticulitis in the pelvis with scattered amounts of free air throughout the abdomen. No free fluid. His family is in the room with him.  Past Medical History  Diagnosis Date  . CAD (coronary artery disease)   . Ischemic cardiomyopathy   . CHF (congestive heart failure)   . Dyslipidemia   . Diverticulosis of colon (without mention of hemorrhage)   . Diabetes mellitus   . Anemia, unspecified   . Unspecified disorder resulting from impaired renal function   . Chronic airway obstruction, not elsewhere classified     on o2 at night  . Prostatic hypertrophy     hx of  . Retention of urine, unspecified   . ICD (implantable cardiac defibrillator) in place     st jude  . Emphysema   . Hemorrhoids   . MI, old 2010 or 2011  . Macular degeneration   . Cataracts, bilateral     Past Surgical History  Procedure Laterality Date  . Pci      4/11  . Cardiac defibrillator placement  2011    st jude  . Pacemaker insertion  2011  . Tonsillectomy  80 yo  . Cataract extraction      Family History  Problem Relation Age of Onset  . Lung cancer Father     smoker  . Coronary artery disease Mother   . Stroke  Mother   . Breast cancer Sister   . Atrial fibrillation Daughter   . Congestive Heart Failure Mother     Social History:  reports that he quit smoking about 4 years ago. His smoking use included Cigarettes. He smoked 0.00 packs per day. He has never used smokeless tobacco. He reports that he drinks alcohol. He reports that he does not use illicit drugs.  Allergies: No Known Allergies  Current facility-administered medications:albuterol (PROVENTIL,VENTOLIN) solution continuous neb, 10 mg/hr, Nebulization, Continuous, Karen Chafe Molpus, MD, Last Rate: 2 mL/hr at 11/13/13 0434, 10 mg/hr at 11/13/13 0434 Current outpatient prescriptions:albuterol (PROVENTIL) (5 MG/ML) 0.5% nebulizer solution, Take 2.5 mg by nebulization every 6 (six) hours as needed for wheezing or shortness of breath., Disp: , Rfl: ;  ALPRAZolam (XANAX) 0.5 MG tablet, Take 0.5 mg by mouth at bedtime as needed for sleep. , Disp: , Rfl: ;  aspirin EC 81 MG tablet, Take 81 mg by mouth at bedtime. , Disp: , Rfl:  budesonide (PULMICORT) 0.5 MG/2ML nebulizer solution, Take 2 mLs (0.5 mg total) by nebulization 2 (two) times daily., Disp: 120 mL, Rfl: 3;  ciprofloxacin (CIPRO) 500 MG tablet, Take 1 tablet (500 mg total) by mouth 2 (two) times daily., Disp: 20 tablet, Rfl: 0;  clopidogrel (PLAVIX) 75 MG tablet, Take 75 mg by mouth every morning. , Disp: , Rfl: ;  famotidine (PEPCID) 20 MG tablet, Take 20 mg by mouth at bedtime. , Disp: , Rfl:  losartan (COZAAR) 25 MG tablet, Take 25 mg by mouth daily., Disp: , Rfl: ;  metoprolol tartrate (LOPRESSOR) 25 MG tablet, Take 25 mg by mouth 2 (two) times daily., Disp: , Rfl: ;  predniSONE (DELTASONE) 20 MG tablet, Take 40 mg by mouth daily with breakfast., Disp: , Rfl: ;  senna (SENOKOT) 8.6 MG TABS tablet, Take 1 tablet by mouth daily., Disp: , Rfl:  tamsulosin (FLOMAX) 0.4 MG CAPS capsule, Take 1 capsule (0.4 mg total) by mouth daily., Disp: 30 capsule, Rfl: 3;  arformoterol (BROVANA) 15 MCG/2ML NEBU, Take  2 mLs (15 mcg total) by nebulization 2 (two) times daily., Disp: 120 mL, Rfl: 3;  nitroGLYCERIN (NITROSTAT) 0.4 MG SL tablet, Place 1 tablet (0.4 mg total) under the tongue every 5 (five) minutes as needed for chest pain., Disp: 25 tablet, Rfl: 4   Results for orders placed during the hospital encounter of 11/13/13 (from the past 48 hour(s))  I-STAT CG4 LACTIC ACID, ED     Status: Abnormal   Collection Time    11/13/13  4:52 AM      Result Value Ref Range   Lactic Acid, Venous 2.91 (*) 0.5 - 2.2 mmol/L  CBC WITH DIFFERENTIAL     Status: Abnormal   Collection Time    11/13/13  4:55 AM      Result Value Ref Range   WBC 16.0 (*) 4.0 - 10.5 K/uL   RBC 4.93  4.22 - 5.81 MIL/uL   Hemoglobin 10.6 (*) 13.0 - 17.0 g/dL   HCT 32.6 (*) 39.0 - 52.0 %   MCV 66.1 (*) 78.0 - 100.0 fL   MCH 21.5 (*) 26.0 - 34.0 pg   MCHC 32.5  30.0 - 36.0 g/dL   RDW 17.8 (*) 11.5 - 15.5 %   Platelets 125 (*) 150 - 400 K/uL   Comment: DELTA CHECK NOTED     REPEATED TO VERIFY   Neutrophils Relative % 93 (*) 43 - 77 %   Neutro Abs 14.8 (*) 1.7 - 7.7 K/uL   Lymphocytes Relative 2 (*) 12 - 46 %   Lymphs Abs 0.4 (*) 0.7 - 4.0 K/uL   Monocytes Relative 5  3 - 12 %   Monocytes Absolute 0.8  0.1 - 1.0 K/uL   Eosinophils Relative 0  0 - 5 %   Eosinophils Absolute 0.0  0.0 - 0.7 K/uL   Basophils Relative 0  0 - 1 %   Basophils Absolute 0.0  0.0 - 0.1 K/uL  COMPREHENSIVE METABOLIC PANEL     Status: Abnormal   Collection Time    11/13/13  4:55 AM      Result Value Ref Range   Sodium 140  137 - 147 mEq/L   Potassium 4.7  3.7 - 5.3 mEq/L   Chloride 100  96 - 112 mEq/L   CO2 20  19 - 32 mEq/L   Glucose, Bld 163 (*) 70 - 99 mg/dL   BUN 63 (*) 6 - 23 mg/dL   Comment: DELTA CHECK NOTED     REPEATED TO VERIFY   Creatinine, Ser 2.08 (*) 0.50 - 1.35 mg/dL   Calcium 8.8  8.4 - 10.5 mg/dL   Total Protein 7.3  6.0 - 8.3 g/dL   Albumin 3.4 (*) 3.5 - 5.2 g/dL   AST 27  0 - 37 U/L   Comment: SLIGHT HEMOLYSIS  HEMOLYSIS AT  THIS LEVEL MAY AFFECT RESULT   ALT 19  0 - 53 U/L   Alkaline Phosphatase 50  39 - 117 U/L   Total Bilirubin 0.3  0.3 - 1.2 mg/dL   GFR calc non Af Amer 27 (*) >90 mL/min   GFR calc Af Amer 32 (*) >90 mL/min   Comment: (NOTE)     The eGFR has been calculated using the CKD EPI equation.     This calculation has not been validated in all clinical situations.     eGFR's persistently <90 mL/min signify possible Chronic Kidney     Disease.   Anion gap 20 (*) 5 - 15  LIPASE, BLOOD     Status: None   Collection Time    11/13/13  4:55 AM      Result Value Ref Range   Lipase 19  11 - 59 U/L  TROPONIN I     Status: None   Collection Time    11/13/13  4:56 AM      Result Value Ref Range   Troponin I <0.30  <0.30 ng/mL   Comment:            Due to the release kinetics of cTnI,     a negative result within the first hours     of the onset of symptoms does not rule out     myocardial infarction with certainty.     If myocardial infarction is still suspected,     repeat the test at appropriate intervals.  BLOOD GAS, VENOUS     Status: Abnormal   Collection Time    11/13/13  5:01 AM      Result Value Ref Range   O2 Content 8.0     Delivery systems AEROSOL MASK     pH, Ven 7.247 (*) 7.250 - 7.300   pCO2, Ven 53.8 (*) 45.0 - 50.0 mmHg   pO2, Ven BELOW REPORTABLE RANGE  30.0 - 45.0 mmHg   Comment: CRITICAL RESULT CALLED TO, READ BACK BY AND VERIFIED WITH:     JOHN MOLPUS,MD AT 0508 BY AMY RAY,RRT,RCP ON 11/13/13   Bicarbonate 22.6  20.0 - 24.0 mEq/L   TCO2 21.9  0 - 100 mmol/L   Acid-base deficit 4.5 (*) 0.0 - 2.0 mmol/L   O2 Saturation 23.3     Patient temperature 98.6     Collection site VEIN     Drawn by COLLECTED BY NURSE     Sample type VEIN    URINALYSIS, ROUTINE W REFLEX MICROSCOPIC     Status: Abnormal   Collection Time    11/13/13  6:50 AM      Result Value Ref Range   Color, Urine YELLOW  YELLOW   APPearance CLOUDY (*) CLEAR   Specific Gravity, Urine 1.019  1.005 - 1.030    pH 5.0  5.0 - 8.0   Glucose, UA NEGATIVE  NEGATIVE mg/dL   Hgb urine dipstick TRACE (*) NEGATIVE   Bilirubin Urine NEGATIVE  NEGATIVE   Ketones, ur NEGATIVE  NEGATIVE mg/dL   Protein, ur 30 (*) NEGATIVE mg/dL   Urobilinogen, UA 1.0  0.0 - 1.0 mg/dL   Nitrite NEGATIVE  NEGATIVE   Leukocytes, UA SMALL (*) NEGATIVE  URINE MICROSCOPIC-ADD ON     Status: Abnormal   Collection Time    11/13/13  6:50 AM      Result Value Ref Range   WBC, UA 0-2  <3 WBC/hpf   RBC / HPF  0-2  <3 RBC/hpf   Bacteria, UA MANY (*) RARE   Urine-Other AMORPHOUS URATES/PHOSPHATES      Ct Abdomen Pelvis Wo Contrast  11/13/2013   CLINICAL DATA:  Abdominal pain and distention.  Initial encounter  EXAM: CT ABDOMEN AND PELVIS WITHOUT CONTRAST  TECHNIQUE: Multidetector CT imaging of the abdomen and pelvis was performed following the standard protocol without IV contrast.  COMPARISON:  07/03/2013  FINDINGS: BODY WALL: Unremarkable.  LOWER CHEST: Pacer leads are present, but only partially visualized.  ABDOMEN/PELVIS:  Liver: No focal abnormality.  Biliary: Cholelithiasis.  No acute cholecystitis identified.  Pancreas: Unremarkable.  Spleen: Unremarkable.  Adrenals: Unremarkable.  Kidneys and ureters: Bilateral renal masses of varying density are stable compared to recent imaging. Most of these are simple or hemorrhagic cyst based on renal ultrasound 07/03/2013, although further characterization was recommended for a septated or nodular cyst. The previously noted right ureteral calculus has passed. No hydronephrosis.  Bladder: Unremarkable.  Reproductive: Enlarged prostate, deforming the bladder base.  Bowel: Inflammation surrounds a focally thickened sigmoid diverticulum consistent with diverticulitis. The diverticulitis is complicated by free perforation with scattered pneumoperitoneum throughout the anti dependent abdomen. There is no evidence for fluid collection. There is close proximity between the diverticulitis and the  posterior bladder. No pneumaturia or bladder wall thickening to suggest fistulization has occurred. No bowel obstruction. Negative appendix.  Retroperitoneum: No mass or adenopathy.  Peritoneum: Small to moderate pneumoperitoneum as above.  Vascular: No acute abnormality.  OSSEOUS: Osteopenia with numerous tiny lucencies throughout the imaged skeleton. No acute fracture.  Critical Value/emergent results were called by telephone at the time of interpretation on 11/13/2013 at 6:56 am to Dr. Shanon Rosser , who verbally acknowledged these results.  IMPRESSION: 1. Sigmoid diverticulitis complicated by free perforation. Pneumoperitoneum is small to moderate volume. 2. Bilateral complex renal cysts, reference imaging recommendations from 07/03/2013. 3. Tiny lucencies throughout the skeleton is likely from the patient's significant osteopenia. Myeloma is the main differential consideration, after convalescence consider serum electrophoresis. 4. Cholelithiasis.   Electronically Signed   By: Jorje Guild M.D.   On: 11/13/2013 07:00   Dg Chest 2 View  11/11/2013   CLINICAL DATA:  Initial evaluation for weakness, increased shortness of breath, current history of urinary tract infection, testicular pain, history of coronary artery disease congestive heart failure diabetes myocardial infarction, former smoker  EXAM: CHEST  2 VIEW  COMPARISON:  07/01/2013  FINDINGS: Hyperinflation consistent with COPD. Stable cardiac pacer. Uncoiling of the aorta. Vascular pattern normal. No consolidation or effusion. No significant change when compared to the prior study.  IMPRESSION: COPD with no acute findings   Electronically Signed   By: Skipper Cliche M.D.   On: 11/11/2013 21:57   Dg Chest Port 1 View  11/13/2013   CLINICAL DATA:  Abdominal pain beginning after dinner. Acute onset shortness of breath while in triage.  EXAM: PORTABLE CHEST - 1 VIEW  COMPARISON:  Chest radiograph November 11, 2013  FINDINGS: Cardiac silhouette is  nonenlarged, similar appearing tortuous mildly calcified aorta. No pleural effusions or focal consolidations. Slightly increased lung volumes may reflect COPD. Dual lead LEFT cardiac defibrillator in situ. No pneumothorax. Soft tissue planes and included osseous structures are nonsuspicious.  IMPRESSION: No acute cardiopulmonary process ; stable appearance of the chest from November 11, 2013.   Electronically Signed   By: Elon Alas   On: 11/13/2013 05:02   Dg Abd Portable 1v  11/13/2013   CLINICAL DATA:  Abdominal distention.  Initial  encounter  EXAM: PORTABLE ABDOMEN - 1 VIEW  COMPARISON:  07/13/2013  FINDINGS: No evidence of bowel obstruction. Stool volume is within normal limits. No concerning intra-abdominal mass effect or calcification. Visualized lung bases are grossly clear. Lumbar levoscoliosis with advanced degenerative disc disease. Patchy density of the pelvis is not a reliable finding given blurring artifact noted on both images.  IMPRESSION: Negative for bowel obstruction.   Electronically Signed   By: Jorje Guild M.D.   On: 11/13/2013 05:06    Review of Systems  Constitutional: Positive for fever.  HENT: Positive for hearing loss.   Respiratory: Positive for shortness of breath.   Cardiovascular: Negative for chest pain.  Gastrointestinal: Positive for heartburn and abdominal pain.   Blood pressure 99/52, pulse 102, temperature 98.4 F (36.9 C), temperature source Oral, resp. rate 26, SpO2 95.00%. Physical Exam  Constitutional:  Ill-appearing, distressed, elderly gentleman.  HENT:  Head: Normocephalic and atraumatic.  Respiratory: He has wheezes.  He is tachypneic with increased work of breathing  GI: He exhibits distension. He exhibits no mass. There is tenderness (diffusely but more so in lower abdomen). There is rebound (diffuse).  Genitourinary:  Foley in  Neurological: He is alert.    Assessment/Plan: 1. Acute sigmoid diverticulitis with perforation and  mild to moderate amount of pneumoperitoneum.  2. Severe COPD with acute exacerbation and respiratory distress.  3. Acute on chronic renal failure   4. Coronary artery disease congestive heart failure and pacemaker present  Plan: I had a long discussion with the patient and his family regarding operative intervention which would be laparotomy, partial colectomy, colostomy. This would certainly require him to be on a mechanical ventilator, and his concurrent condition this could be long-term or permanent. The second option we talked about was broad-spectrum antibiotics and bowel rest. Corticosteroids are contraindicated either way. We discussed the procedure and risks including death of either treatment.  Reportedly, the patient had been told by his cardiologist that he should not have any further surgery if that was at all possible. The patient and family have decided they did not want surgery. They want to try the medical option. Dr. Carles Collet is seeing him as well. He had a long discussion with them about him needing mechanical ventilation and his current condition. CODE STATUS was also discussed at length. The patient and family have chosen to be a full code at this time.  I think his prognosis, with either treatment, is poor at this time.  [name] [name] J 11/13/2013, 8:11 AM

## 2013-11-14 DIAGNOSIS — R0602 Shortness of breath: Secondary | ICD-10-CM

## 2013-11-14 DIAGNOSIS — Z01811 Encounter for preprocedural respiratory examination: Secondary | ICD-10-CM

## 2013-11-14 DIAGNOSIS — K5740 Diverticulitis of both small and large intestine with perforation and abscess without bleeding: Secondary | ICD-10-CM

## 2013-11-14 DIAGNOSIS — I2510 Atherosclerotic heart disease of native coronary artery without angina pectoris: Secondary | ICD-10-CM

## 2013-11-14 LAB — COMPREHENSIVE METABOLIC PANEL
ALBUMIN: 3 g/dL — AB (ref 3.5–5.2)
ALK PHOS: 36 U/L — AB (ref 39–117)
ALT: 24 U/L (ref 0–53)
ANION GAP: 14 (ref 5–15)
AST: 32 U/L (ref 0–37)
BUN: 74 mg/dL — AB (ref 6–23)
CO2: 22 mEq/L (ref 19–32)
Calcium: 8 mg/dL — ABNORMAL LOW (ref 8.4–10.5)
Chloride: 103 mEq/L (ref 96–112)
Creatinine, Ser: 2.93 mg/dL — ABNORMAL HIGH (ref 0.50–1.35)
GFR calc Af Amer: 21 mL/min — ABNORMAL LOW (ref >90)
GFR calc non Af Amer: 18 mL/min — ABNORMAL LOW (ref >90)
Glucose, Bld: 135 mg/dL — ABNORMAL HIGH (ref 70–99)
Potassium: 4 mEq/L (ref 3.7–5.3)
SODIUM: 139 meq/L (ref 137–147)
TOTAL PROTEIN: 6.1 g/dL (ref 6.0–8.3)
Total Bilirubin: 0.5 mg/dL (ref 0.3–1.2)

## 2013-11-14 LAB — CBC
HCT: 24.2 % — ABNORMAL LOW (ref 39.0–52.0)
HEMOGLOBIN: 7.9 g/dL — AB (ref 13.0–17.0)
MCH: 21.4 pg — ABNORMAL LOW (ref 26.0–34.0)
MCHC: 32.6 g/dL (ref 30.0–36.0)
MCV: 65.4 fL — ABNORMAL LOW (ref 78.0–100.0)
PLATELETS: 84 10*3/uL — AB (ref 150–400)
RBC: 3.7 MIL/uL — ABNORMAL LOW (ref 4.22–5.81)
RDW: 17.7 % — ABNORMAL HIGH (ref 11.5–15.5)
WBC: 9.2 10*3/uL (ref 4.0–10.5)

## 2013-11-14 LAB — CBC WITH DIFFERENTIAL/PLATELET
Basophils Absolute: 0 10*3/uL (ref 0.0–0.1)
Basophils Relative: 0 % (ref 0–1)
Eosinophils Absolute: 0 10*3/uL (ref 0.0–0.7)
Eosinophils Relative: 0 % (ref 0–5)
HEMATOCRIT: 23.9 % — AB (ref 39.0–52.0)
Hemoglobin: 7.8 g/dL — ABNORMAL LOW (ref 13.0–17.0)
LYMPHS PCT: 4 % — AB (ref 12–46)
Lymphs Abs: 0.3 10*3/uL — ABNORMAL LOW (ref 0.7–4.0)
MCH: 21.7 pg — ABNORMAL LOW (ref 26.0–34.0)
MCHC: 32.6 g/dL (ref 30.0–36.0)
MCV: 66.6 fL — ABNORMAL LOW (ref 78.0–100.0)
MONOS PCT: 3 % (ref 3–12)
Monocytes Absolute: 0.2 10*3/uL (ref 0.1–1.0)
NEUTROS PCT: 93 % — AB (ref 43–77)
Neutro Abs: 7.4 10*3/uL (ref 1.7–7.7)
Platelets: 69 10*3/uL — ABNORMAL LOW (ref 150–400)
RBC: 3.59 MIL/uL — ABNORMAL LOW (ref 4.22–5.81)
RDW: 17.7 % — ABNORMAL HIGH (ref 11.5–15.5)
WBC: 7.9 10*3/uL (ref 4.0–10.5)

## 2013-11-14 LAB — PHOSPHORUS: PHOSPHORUS: 4.4 mg/dL (ref 2.3–4.6)

## 2013-11-14 LAB — URINE CULTURE
COLONY COUNT: NO GROWTH
CULTURE: NO GROWTH

## 2013-11-14 LAB — MAGNESIUM: MAGNESIUM: 2.2 mg/dL (ref 1.5–2.5)

## 2013-11-14 LAB — LACTIC ACID, PLASMA: Lactic Acid, Venous: 1.4 mmol/L (ref 0.5–2.2)

## 2013-11-14 MED ORDER — PANTOPRAZOLE SODIUM 40 MG IV SOLR
40.0000 mg | Freq: Two times a day (BID) | INTRAVENOUS | Status: DC
  Administered 2013-11-14 – 2013-11-22 (×17): 40 mg via INTRAVENOUS
  Filled 2013-11-14 (×17): qty 40

## 2013-11-14 MED ORDER — SODIUM CHLORIDE 0.9 % IV BOLUS (SEPSIS)
500.0000 mL | Freq: Once | INTRAVENOUS | Status: AC
  Administered 2013-11-14: 500 mL via INTRAVENOUS

## 2013-11-14 NOTE — Progress Notes (Signed)
PULMONARY / CRITICAL CARE MEDICINE   Name: [name] [name] MRN: [id number] DOB: 22-May-1925    ADMISSION DATE:  11/13/2013 CONSULTATION DATE:  11/13/2013   REFERRING MD :  Dr Shanon Brow [facility] of [facility]  CHIEF COMPLAINT:  Respiratory Distress in setting of COPD,  Cardiomyopathy, and GI perf. 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   The patient was in the emergency department on 11/11/2013. He was diagnosed with a COPD exacerbation. Admission was recommended, but the patient wanted to go home. He was sent home with prednisone and ciprofloxacin. He presented again today with shortness of breath that was not much better as well as abdominal pain. CT of the abdomen and pelvis in the emergency department revealed perforated diverticulitis with pneumoperitoneum. The patient was given a dose of Zosyn as well as fentanyl x2 IV and albuterol nebulizer. General surgery, Dr. Zella Richer was consulted to see the patient. After he evaluated the patient, the surgical plan was to treat the patient medically without any surgical intervention.  In the ER patient noticed to be tachypneic and wheezing but surgery strongly recommended against steroids given GI perf and need to medically manage due to high medical risk for post operative complications.   Patient nearly got intubated but improvved after nebs, o2 and ? O2. PCCM asked to consult for resp failure mgmt  Family and patient undecided about code status   SUBJECTIVE:  Awake and alert, using nebulizer mask at 10 l/m flow on top of nasal cannula because it makes him feel good. VITAL SIGNS: Temp:  [98 F (36.7 C)-98.5 F (36.9 C)] 98.5 F (36.9  C) (10/19 0400) Pulse Rate:  [80-115] 82 (10/19 1200) Resp:  [18-43] 23 (10/19 1200) BP: (92-129)/(34-93) 128/81 mmHg (10/19 1200) SpO2:  [93 %-100 %] 95 % (10/19 1212) Weight:  [187 lb 6.3 oz (85 kg)] 187 lb 6.3 oz (85 kg) (10/19 0400) HEMODYNAMICS:   VENTILATOR SETTINGS:   INTAKE / OUTPUT:  Intake/Output Summary (Last 24 hours) at 11/14/13 1237 Last data filed at 11/14/13 1216  Gross per 24 hour  Intake 3149.67 ml  Output   1355 ml  Net 1794.67 ml    PHYSICAL EXAMINATION: General:  Elderly male. No major distress, pleasant and alert Neuro:  A.xo3. Speech normal. . Jovial HEENT:   Neck supple, no jvd/lan Cardiovascular: . Normal heart sounds RRR Lungs:  Mildly tachypneic.No resp acc muscle use. Decreased bs bases Abdomen:  Distended, mildly tender lower abdomen, decreased bs Musculoskeletal:  No cyanosis, No clubbing,. No edema, non tender Skin:  Intact anteriorly  LABS: PULMONARY  Recent Labs Lab 11/13/13 0501  HCO3 22.6  TCO2 21.9  O2SAT 23.3    CBC  Recent Labs Lab 11/13/13 0455 11/14/13 0340 11/14/13 1213  HGB 10.6* 7.8* 7.9*  HCT 32.6* 23.9* 24.2*  WBC 16.0* 7.9 9.2  PLT 125* 69* 84*    COAGULATION No results found for this basename: INR,  in the last 168 hours  [facility] Lab 11/11/13 2139 11/13/13 0456  TROPONINI <0.30 <0.30    Recent Labs Lab 11/11/13 2139  PROBNP 759.3*     CHEMISTRY  Recent Labs Lab 11/11/13 2139 11/13/13 0455 11/14/13 0340  NA 139  140 139  K 4.4 4.7 4.0  CL 100 100 103  CO2 23 20 22   GLUCOSE 101* 163* 135*  BUN 33* 63* 74*  CREATININE 1.69* 2.08* 2.93*  CALCIUM 8.5 8.8 8.0*  MG  --   --  2.2  PHOS  --   --  4.4   Estimated Creatinine Clearance: 19.9 ml/min (by C-G formula based on Cr of 2.93).   LIVER  Recent Labs Lab 11/13/13 0455 11/14/13 0340  AST 27 32  ALT 19 24  ALKPHOS 50 36*  BILITOT 0.3 0.5  PROT 7.3 6.1  ALBUMIN 3.4* 3.0*     INFECTIOUS  Recent Labs Lab  11/11/13 2149 11/13/13 0452 11/14/13 0340  LATICACIDVEN 1.29 2.91* 1.4     ENDOCRINE CBG (last 3)  No results found for this basename: GLUCAP,  in the last 72 hours       IMAGING x48h Ct Abdomen Pelvis Wo Contrast  11/13/2013   CLINICAL DATA:  Abdominal pain and distention.  Initial encounter  EXAM: CT ABDOMEN AND PELVIS WITHOUT CONTRAST  TECHNIQUE: Multidetector CT imaging of the abdomen and pelvis was performed following the standard protocol without IV contrast.  COMPARISON:  07/03/2013  FINDINGS: BODY WALL: Unremarkable.  LOWER CHEST: Pacer leads are present, but only partially visualized.  ABDOMEN/PELVIS:  Liver: No focal abnormality.  Biliary: Cholelithiasis.  No acute cholecystitis identified.  Pancreas: Unremarkable.  Spleen: Unremarkable.  Adrenals: Unremarkable.  Kidneys and ureters: Bilateral renal masses of varying density are stable compared to recent imaging. Most of these are simple or hemorrhagic cyst based on renal ultrasound 07/03/2013, although further characterization was recommended for a septated or nodular cyst. The previously noted right ureteral calculus has passed. No hydronephrosis.  Bladder: Unremarkable.  Reproductive: Enlarged prostate, deforming the bladder base.  Bowel: Inflammation surrounds a focally thickened sigmoid diverticulum consistent with diverticulitis. The diverticulitis is complicated by free perforation with scattered pneumoperitoneum throughout the anti dependent abdomen. There is no evidence for fluid collection. There is close proximity between the diverticulitis and the posterior bladder. No pneumaturia or bladder wall thickening to suggest fistulization has occurred. No bowel obstruction. Negative appendix.  Retroperitoneum: No mass or adenopathy.  Peritoneum: Small to moderate pneumoperitoneum as above.  Vascular: No acute abnormality.  OSSEOUS: Osteopenia with numerous tiny lucencies throughout the imaged skeleton. No acute fracture.  Critical  Value/emergent results were called by telephone at the time of interpretation on 11/13/2013 at 6:56 am to Dr. Shanon Rosser , who verbally acknowledged these results.  IMPRESSION: 1. Sigmoid diverticulitis complicated by free perforation. Pneumoperitoneum is small to moderate volume. 2. Bilateral complex renal cysts, reference imaging recommendations from 07/03/2013. 3. Tiny lucencies throughout the skeleton is likely from the patient's significant osteopenia. Myeloma is the main differential consideration, after convalescence consider serum electrophoresis. 4. Cholelithiasis.   Electronically Signed   By: Jorje Guild M.D.   On: 11/13/2013 07:00   Dg Chest Port 1 View  11/13/2013   CLINICAL DATA:  Abdominal pain beginning after dinner. Acute onset shortness of breath while in triage.  EXAM: PORTABLE CHEST - 1 VIEW  COMPARISON:  Chest radiograph November 11, 2013  FINDINGS: Cardiac silhouette is nonenlarged, similar appearing tortuous mildly calcified aorta. No pleural effusions or focal consolidations. Slightly increased lung volumes may reflect COPD. Dual lead LEFT cardiac defibrillator in situ. No pneumothorax. Soft tissue planes and included osseous structures are nonsuspicious.  IMPRESSION: No acute cardiopulmonary process ; stable appearance of the chest from November 11, 2013.  Electronically Signed   By: Elon Alas   On: 11/13/2013 05:02   Dg Abd Portable 1v  11/13/2013   CLINICAL DATA:  Abdominal distention.  Initial encounter  EXAM: PORTABLE ABDOMEN - 1 VIEW  COMPARISON:  07/13/2013  FINDINGS: No evidence of bowel obstruction. Stool volume is within normal limits. No concerning intra-abdominal mass effect or calcification. Visualized lung bases are grossly clear. Lumbar levoscoliosis with advanced degenerative disc disease. Patchy density of the pelvis is not a reliable finding given blurring artifact noted on both images.  IMPRESSION: Negative for bowel obstruction.   Electronically Signed    By: Jorje Guild M.D.   On: 11/13/2013 05:06         ASSESSMENT / PLAN:  PULMONARY OETT A:Hx of COPD. REcent AECOPD 11/11/13 and Rx with prednisone. REturned 11/13/2013 with sigmoid perf. CCS does not advise steroids. Now in resp distress  P:   Sccheduled nebs Scheduled pulmicort Hold off systemic steroids Intubate if worse if family so wishes  CARDIOVASCULAR Chronic systlioc CHF with AICD A: BP/HR stable P:  Fluids per [facility]  RENAL Lab Results  Component Value Date   CREATININE 2.93* 11/14/2013   CREATININE 2.08* 11/13/2013   CREATININE 1.69* 11/11/2013    A:  Acute on chronic renal insuff. Worse 10/19 P:   Fluids per [facility]  GASTROINTESTINAL A:  Sigmoid perf P:   Per [facility] and [facility] Surg suggested medical managemnt  HEMATOLOGIC A:  At risk for anemia P:  Per [facility]  INFECTIOUS A:  Sigmoid perf P:   Abx per [facility]   ENDOCRINE CBG (last 3)  No results found for this basename: GLUCAP,  in the last 72 hours   A:  At risk for hyperglycemia   P:   ssi pe [facility]  NEUROLOGIC A:  intact P:   monitor   FAMILY Updates:  Wife, daughter, patient, updated  Today's summary: Full code. Medical management of perfed colon.  Richardson Landry Minor ACNP Maryanna Shape PCCM Pager [phone] till 3 pm If no answer page [phone] 11/14/2013, 12:44 PM      PREOPERATIVE RISK ASSESSMENT PULMONARY DONE AS BELOW BY STAFF MD  Fleet Contras Postperative Pulmonary Risk Score Comment 11/14/2013  Score  Type of surgery - abd ao aneurysm (27), thoracic (21), neurosurgery / upper abdominal / vascular (21), neck (11) Presumed mid-lower ad 0  Emergency Surgery - (11) yes 11  ALbumin < 3 or poor nutritional state - (9) 3.0 0  BUN > 30 -  (8) 74 8  Partial or completely dependent functional status - (7) Independent baseline 0  COPD -  (6) Yes copd, fev1 1.6L/60%. Dr Melvyn Novas did not think he had copd or significnat copd 6   Age - 29 to 101 (4), > 70  (6) Age 61 6  TOTAL  31  Risk  Stratifcation scores  - < 10, 11-19, 20-27, 28-40, >40  Mod-high     CANET Postperative Pulmonary Risk Score Comment Score  Age - <50 (0), 50-80 (3), >80 (16) Age 75 16  Preoperative pulse ox - >96 (0), 91-95 (8), <90 (24) > 96% 0  Respiratory infection in last month - Yes (17) Yes, ed visit 11/11/13 17  Preoperative anemia - < 10gm% - Yes (11) Yes, hgb 7.9gm% 11  Surgical incision - Upper abdominal (15), Thoracic (24) None, lower abd 0  Duration of surgery - <2h (0), 2-3h (16), >3h (23) Presumed <2h 0  Emergency Surgery - Yes (8) yes 8  TOTAL  52  Risk Stratification - Low (<26), Intermediate (26-44), High (>45)  High    In terms of pulmonary complications following p-lap  Risk ameliorating factors are his oxygenation status, not needing pressors, independent baseline. Risk aggravating factors are possible, copd, emergency surgery, renal dysfunction, anemia and emergency surgery  Overall Risk is MODERATE  - HIGH. If his anemia, and renal dysfunction and hypoxemia get worse, his postop riks is higher  Major Pulmonary risks identified in the multifactorial risk analysis are but not limited to a) pneumonia; b) recurrent intubation risk; c) prolonged or recurrent acute respiratory failure needing mechanical ventilation; d) prolonged hospitalization; e) DVT/Pulmonary embolism; f) Acute Pulmonary edema  Recommend 1. Short duration of surgery as much as possible and avoid paralytic if possible 2. Recovery in step down or ICU with Pulmonary consultation 3. DVT prophylaxis 4. Aggressive pulmonary toilet with o2, bronchodilatation, and incentive spirometry and early ambulation    Rest per NP   Dr. Brand Males, M.D., [facility].C.P Pulmonary and Critical Care Medicine Staff Physician [facility] Pager: [phone], If no answer or between  15:00h - 7:00h: call [phone]  11/14/2013 6:21 PM

## 2013-11-14 NOTE — Consult Note (Signed)
CARDIOLOGY CONSULT NOTE       Patient ID: Friedrich Harriott MRN: [id number] DOB/AGE: 06-22-1925 80 y.o.  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  CAD with stents in RCA/circumflex and IM.  EF 35-40%  AICD subsequently placed and active.  He has not been hospitalized for his heart since then with no AICD d/c and no clinical CHF.  He has significant COPD but when last checked 2013  FEV1 1.5.  He tends to have upper airway wheezing.  Overall functional status has declined a bit over the last year but still doing all ADL;' s and abulatory.  Admitted with abdominal pain and perforated diverticulitis.    I had a long discussion with family over weeekend as well as CCM/Dr Ramaschamy today and I also directly spoke with Dr Excell Seltzer who is covering [facility] this week.  The patient has no absolute contraindication to surgery for perforated colon.  The family would rather have him intubated for general anethesia and take risk of surgery if he is deteriorating rather intubate him for palliative care once multi-system failure sets in    He is not DNR He is a full code and AICD is active.  Concerning signs are increasing Cr and elevated lactate with continued abdominal pain.    I stopped his plavix yesterday as his stents are 78 years old and surgery is not out of the question   ROS All other systems reviewed and negative except as noted above  Past Medical History  Diagnosis Date  . CAD (coronary artery disease)   . Ischemic cardiomyopathy   .  CHF (congestive heart failure)   . Dyslipidemia   . Diverticulosis of colon (without mention of hemorrhage)   . Diabetes mellitus   . Anemia, unspecified   . Unspecified disorder resulting from impaired renal function   . Chronic airway obstruction, not elsewhere classified     on o2 at night  . Prostatic hypertrophy     hx of  . Retention of urine, unspecified   . ICD (implantable cardiac defibrillator) in place     st jude  . Emphysema   . Hemorrhoids   . MI, old 2010 or 2011  . Macular degeneration   . Cataracts, bilateral     Family History  Problem Relation Age of Onset  . Lung cancer Father     smoker  . Coronary artery disease Mother   . Stroke Mother   . Breast cancer Sister   . Atrial fibrillation Daughter   . Congestive Heart Failure Mother     History   Social History  . Marital Status: Married    Spouse Name: N/A    Number of Children: N/A  . Years of Education: N/A   Occupational History  . dentist    Social History Main Topics  . Smoking status: Former Smoker    Types: Cigarettes    Quit date: 04/27/2009  . Smokeless tobacco: Never Used  . Alcohol Use: Yes     Comment: occasional beer  . Drug Use: No  .  Sexual Activity: No   Other Topics Concern  . Not on file   Social History Narrative   Lives in a house with stairs, which he needs to use, with his wife.  Wife is not able-bodied.  Does not use a cane or walker, but is unsteady on his feet.        [name] Borrero:  [phone] cell, [phone], daughter, POA.     Boyd Kerbs:  [phone], nephew   Juluis Pitch:  [phone], daughter             Past Surgical History  Procedure Laterality Date  . Pci      4/11  . Cardiac defibrillator placement  2011    st jude  . Pacemaker insertion  2011  . Tonsillectomy  80 yo  . Cataract extraction       . antiseptic oral rinse  7 mL Mouth Rinse BID  . arformoterol  15 mcg Nebulization BID  . budesonide  0.5 mg Nebulization BID  .  ipratropium-albuterol  3 mL Nebulization Q4H  . pantoprazole (PROTONIX) IV  40 mg Intravenous Q12H  . piperacillin-tazobactam (ZOSYN)  IV  3.375 g Intravenous 3 times per day  . sodium chloride  3 mL Intravenous Q12H   . sodium chloride 50 mL/hr at 11/14/13 1216    Physical Exam: Blood pressure 138/80, pulse 83, temperature 99.1 F (37.3 C), temperature source Axillary, resp. rate 27, height 6' (1.829 m), weight 187 lb 6.3 oz (85 kg), SpO2 99.00%.    Affect appropriate Chronically ill elderly greek male  HEENT: normal Neck supple with no adenopathy JVP normal no bruits no thyromegaly Lungs labored breathign with exp  wheezing and good diaphragmatic motion Heart:  S1/S2 SEM  murmur, no rub, gallop or click PMI normal Abdomen: distended mild rebound no BM  no bruit.  No HSM or HJR Distal pulses intact with no bruits No edema Neuro non-focal Skin warm and dry No muscular weakness   Labs:   Lab Results  Component Value Date   WBC 9.2 11/14/2013   HGB 7.9* 11/14/2013   HCT 24.2* 11/14/2013   MCV 65.4* 11/14/2013   PLT 84* 11/14/2013    Recent Labs Lab 11/14/13 0340  NA 139  K 4.0  CL 103  CO2 22  BUN 74*  CREATININE 2.93*  CALCIUM 8.0*  PROT 6.1  BILITOT 0.5  ALKPHOS 36*  ALT 24  AST 32  GLUCOSE 135*   Lab Results  Component Value Date   CKTOTAL 838* 04/19/2009   CKMB 102.0 RESULTS CONFIRMED BY MANUAL DILUTION CRITICAL VALUE NOTED.  VALUE IS CONSISTENT WITH PREVIOUSLY REPORTED AND CALLED VALUE.* 04/19/2009   TROPONINI <0.30 11/13/2013       Radiology: Ct Abdomen Pelvis Wo Contrast  11/13/2013   CLINICAL DATA:  Abdominal pain and distention.  Initial encounter  EXAM: CT ABDOMEN AND PELVIS WITHOUT CONTRAST  TECHNIQUE: Multidetector CT imaging of the abdomen and pelvis was performed following the standard protocol without IV contrast.  COMPARISON:  07/03/2013  FINDINGS: BODY WALL: Unremarkable.  LOWER CHEST: Pacer leads are present, but only partially  visualized.  ABDOMEN/PELVIS:  Liver: No focal abnormality.  Biliary: Cholelithiasis.  No acute cholecystitis identified.  Pancreas: Unremarkable.  Spleen: Unremarkable.  Adrenals: Unremarkable.  Kidneys and ureters: Bilateral renal masses of varying density are stable compared to recent imaging. Most of these are simple or hemorrhagic cyst based on renal ultrasound 07/03/2013, although further characterization was recommended for a septated or nodular cyst. The  previously noted right ureteral calculus has passed. No hydronephrosis.  Bladder: Unremarkable.  Reproductive: Enlarged prostate, deforming the bladder base.  Bowel: Inflammation surrounds a focally thickened sigmoid diverticulum consistent with diverticulitis. The diverticulitis is complicated by free perforation with scattered pneumoperitoneum throughout the anti dependent abdomen. There is no evidence for fluid collection. There is close proximity between the diverticulitis and the posterior bladder. No pneumaturia or bladder wall thickening to suggest fistulization has occurred. No bowel obstruction. Negative appendix.  Retroperitoneum: No mass or adenopathy.  Peritoneum: Small to moderate pneumoperitoneum as above.  Vascular: No acute abnormality.  OSSEOUS: Osteopenia with numerous tiny lucencies throughout the imaged skeleton. No acute fracture.  Critical Value/emergent results were called by telephone at the time of interpretation on 11/13/2013 at 6:56 am to Dr. Shanon Rosser , who verbally acknowledged these results.  IMPRESSION: 1. Sigmoid diverticulitis complicated by free perforation. Pneumoperitoneum is small to moderate volume. 2. Bilateral complex renal cysts, reference imaging recommendations from 07/03/2013. 3. Tiny lucencies throughout the skeleton is likely from the patient's significant osteopenia. Myeloma is the main differential consideration, after convalescence consider serum electrophoresis. 4. Cholelithiasis.   Electronically Signed    By: Jorje Guild M.D.   On: 11/13/2013 07:00   Dg Chest 2 View  11/11/2013   CLINICAL DATA:  Initial evaluation for weakness, increased shortness of breath, current history of urinary tract infection, testicular pain, history of coronary artery disease congestive heart failure diabetes myocardial infarction, former smoker  EXAM: CHEST  2 VIEW  COMPARISON:  07/01/2013  FINDINGS: Hyperinflation consistent with COPD. Stable cardiac pacer. Uncoiling of the aorta. Vascular pattern normal. No consolidation or effusion. No significant change when compared to the prior study.  IMPRESSION: COPD with no acute findings   Electronically Signed   By: Skipper Cliche M.D.   On: 11/11/2013 21:57   Dg Chest Port 1 View  11/13/2013   CLINICAL DATA:  Abdominal pain beginning after dinner. Acute onset shortness of breath while in triage.  EXAM: PORTABLE CHEST - 1 VIEW  COMPARISON:  Chest radiograph November 11, 2013  FINDINGS: Cardiac silhouette is nonenlarged, similar appearing tortuous mildly calcified aorta. No pleural effusions or focal consolidations. Slightly increased lung volumes may reflect COPD. Dual lead LEFT cardiac defibrillator in situ. No pneumothorax. Soft tissue planes and included osseous structures are nonsuspicious.  IMPRESSION: No acute cardiopulmonary process ; stable appearance of the chest from November 11, 2013.   Electronically Signed   By: Elon Alas   On: 11/13/2013 05:02   Dg Abd Portable 1v  11/13/2013   CLINICAL DATA:  Abdominal distention.  Initial encounter  EXAM: PORTABLE ABDOMEN - 1 VIEW  COMPARISON:  07/13/2013  FINDINGS: No evidence of bowel obstruction. Stool volume is within normal limits. No concerning intra-abdominal mass effect or calcification. Visualized lung bases are grossly clear. Lumbar levoscoliosis with advanced degenerative disc disease. Patchy density of the pelvis is not a reliable finding given blurring artifact noted on both images.  IMPRESSION: Negative for  bowel obstruction.   Electronically Signed   By: Jorje Guild M.D.   On: 11/13/2013 05:06    EKG:  SR rate 93 normal    ASSESSMENT AND PLAN:  Perforated Colon:  Personally discussed with surgery and CCM  I think we are missing a window to operate.  He is having progressive renal failure , sepsis , continued abdominal pain and elevated lactate.  Either option has high mortality but family and I would rather intubate him for surgery wit  hope of post of recovery rather than watch him slip into multisystem failure and be intubated when he does poorly with no surgery.  At some point he should have NG tube and parental nutrition needs to be started.  The family understands that his mortality risk is extremely high in either case.  Continue to monitor exam and cover with antibiotics.  I suspect he will have multisystem failure within 48 hrs if Rx conservatively as he is.    Cardiac status is stable no angina ECG normal no CHF I have stopped plavix in case surgery needed.    COPD:  Respitory status is tenuous No systemic steroids continue inhalers He is not a DNR and will be intubated for multisystem failure or if he goes to surgery.  CCM following   AICD:  All capabilities are active including ATP and defibrillation   Signed: Jenkins Rouge 11/14/2013, 3:36 PM

## 2013-11-14 NOTE — Care Management Note (Addendum)
    Page 1 of 2   11/22/2013     12:24:54 PM CARE MANAGEMENT NOTE 11/22/2013  Patient:  [name] [name],[name] [name]   Account Number:  [id number]  Date Initiated:  11/14/2013  Documentation initiated by:  DAVIS,RHONDA  Subjective/Objective Assessment:   sepsis due to perforated diverticulum     Action/Plan:   home when stable.has a hha through [facility]   Anticipated DC Date:  11/22/2013   Anticipated DC Plan:  [place] referral  NA      DC Planning Services  CM consult      PAC Choice  NA   Choice offered to / List presented to:  C-1 Patient      DME agency  NA     [place] arranged  HH-2 PT  HH-3 OT  HH-1 RN      [place]   Status of service:  Completed, signed off Medicare Important Message given?  YES (If response is "NO", the following Medicare IM given date fields will be blank) Date Medicare IM given:  11/21/2013 Medicare IM given by:  [facility] Date Additional Medicare IM given:   Additional Medicare IM given by:    Discharge Disposition:  [place]  Per UR Regulation:  Reviewed for med. necessity/level of care/duration of stay  If discussed at [place] of Stay Meetings, dates discussed:   11/22/2013    Comments:  11/22/13 Lala Been RN,BSN NCM [place] ORDERS-HHPT/OT.WOULD RECOMMEND HHRN.[facility] ALREADY AWARE OF D/C & HHC ORDERS.  11/21/13 Barrett Goldie RN,BSN NCM [phone] PATIENT CHOSE [facility]-HHPT.TC [facility]-AWARE OF HHPT, & FOLLOWING FOR D/C HOPEFUL FOR D/C IN AM.  [id number] Rosana Hoes, RN, BSN, CCM Chart reviewed. Discharge needs and patient's stay to be reviewed and followed by case manager. Remains on Venturi Mask/ ileus present and ngt insert by ccs/intialk cause perforated bowel.  [id number] Rosana Hoes, RN, BSN, CCM Chart reviewed. Discharge needs and patient's stay to be reviewed and followed by case manager

## 2013-11-14 NOTE — Progress Notes (Signed)
Patient ID: [name] [name], male   DOB: [birth year], 80 y.o.   MRN: [id number]    Subjective: The patient reports less pain than yesterday. (Question affect the pain meds). Some nausea without any high volume emesis. Has had flatus but no bowel movements.  Objective: Vital signs in last 24 hours: Temp:  [98 F (36.7 C)-99.1 F (37.3 C)] 99.1 F (37.3 C) (10/19 1200) Pulse Rate:  [80-94] 82 (10/19 1200) Resp:  [18-43] 23 (10/19 1200) BP: (92-129)/(34-93) 128/81 mmHg (10/19 1200) SpO2:  [94 %-100 %] 95 % (10/19 1212) Weight:  [187 lb 6.3 oz (85 kg)] 187 lb 6.3 oz (85 kg) (10/19 0400)    Intake/Output from previous day: 10/18 0701 - 10/19 0700 In: 2346.7 [I.V.:1696.7; IV Piggyback:650] Out: 840 [Urine:840] Intake/Output this shift: Total I/O In: 829.7 [I.V.:329.7; IV Piggyback:500] Out: 575 [Urine:575]  General appearance: alert and conversant, in mild distress, chronically ill-appearing Resp: clear anteriorly with mild increased work of breathing GI: moderately distended. Few bowel sounds. Diffuse tenderness with guarding.  Lab Results:   Recent Labs  11/14/13 0340 11/14/13 1213  WBC 7.9 9.2  HGB 7.8* 7.9*  HCT 23.9* 24.2*  PLT 69* 84*   BMET  Recent Labs  11/13/13 0455 11/14/13 0340  NA 140 139  K 4.7 4.0  CL 100 103  CO2 20 22  GLUCOSE 163* 135*  BUN 63* 74*  CREATININE 2.08* 2.93*  CALCIUM 8.8 8.0*     Studies/Results: Ct Abdomen Pelvis Wo Contrast  11/13/2013   CLINICAL DATA:  Abdominal pain and distention.  Initial encounter  EXAM: CT ABDOMEN AND PELVIS WITHOUT CONTRAST  TECHNIQUE: Multidetector CT imaging of the abdomen and pelvis was performed following the standard protocol without IV contrast.  COMPARISON:  07/03/2013  FINDINGS: BODY WALL: Unremarkable.  LOWER CHEST: Pacer leads are present, but only partially visualized.  ABDOMEN/PELVIS:  Liver: No focal abnormality.  Biliary: Cholelithiasis.  No acute cholecystitis identified.  Pancreas:  Unremarkable.  Spleen: Unremarkable.  Adrenals: Unremarkable.  Kidneys and ureters: Bilateral renal masses of varying density are stable compared to recent imaging. Most of these are simple or hemorrhagic cyst based on renal ultrasound 07/03/2013, although further characterization was recommended for a septated or nodular cyst. The previously noted right ureteral calculus has passed. No hydronephrosis.  Bladder: Unremarkable.  Reproductive: Enlarged prostate, deforming the bladder base.  Bowel: Inflammation surrounds a focally thickened sigmoid diverticulum consistent with diverticulitis. The diverticulitis is complicated by free perforation with scattered pneumoperitoneum throughout the anti dependent abdomen. There is no evidence for fluid collection. There is close proximity between the diverticulitis and the posterior bladder. No pneumaturia or bladder wall thickening to suggest fistulization has occurred. No bowel obstruction. Negative appendix.  Retroperitoneum: No mass or adenopathy.  Peritoneum: Small to moderate pneumoperitoneum as above.  Vascular: No acute abnormality.  OSSEOUS: Osteopenia with numerous tiny lucencies throughout the imaged skeleton. No acute fracture.  Critical Value/emergent results were called by telephone at the time of interpretation on 11/13/2013 at 6:56 am to Dr. Shanon Rosser , who verbally acknowledged these results.  IMPRESSION: 1. Sigmoid diverticulitis complicated by free perforation. Pneumoperitoneum is small to moderate volume. 2. Bilateral complex renal cysts, reference imaging recommendations from 07/03/2013. 3. Tiny lucencies throughout the skeleton is likely from the patient's significant osteopenia. Myeloma is the main differential consideration, after convalescence consider serum electrophoresis. 4. Cholelithiasis.   Electronically Signed   By: Jorje Guild M.D.   On: 11/13/2013 07:00   Dg Chest [facility]  11/13/2013   CLINICAL DATA:  Abdominal pain beginning  after dinner. Acute onset shortness of breath while in triage.  EXAM: PORTABLE CHEST - 1 VIEW  COMPARISON:  Chest radiograph November 11, 2013  FINDINGS: Cardiac silhouette is nonenlarged, similar appearing tortuous mildly calcified aorta. No pleural effusions or focal consolidations. Slightly increased lung volumes may reflect COPD. Dual lead LEFT cardiac defibrillator in situ. No pneumothorax. Soft tissue planes and included osseous structures are nonsuspicious.  IMPRESSION: No acute cardiopulmonary process ; stable appearance of the chest from November 11, 2013.   Electronically Signed   By: Elon Alas   On: 11/13/2013 05:02   Dg Abd Portable 1v  11/13/2013   CLINICAL DATA:  Abdominal distention.  Initial encounter  EXAM: PORTABLE ABDOMEN - 1 VIEW  COMPARISON:  07/13/2013  FINDINGS: No evidence of bowel obstruction. Stool volume is within normal limits. No concerning intra-abdominal mass effect or calcification. Visualized lung bases are grossly clear. Lumbar levoscoliosis with advanced degenerative disc disease. Patchy density of the pelvis is not a reliable finding given blurring artifact noted on both images.  IMPRESSION: Negative for bowel obstruction.   Electronically Signed   By: Jorje Guild M.D.   On: 11/13/2013 05:06    Anti-infectives: Anti-infectives   Start     Dose/Rate Route Frequency Ordered Stop   11/13/13 1400  piperacillin-tazobactam (ZOSYN) IVPB 3.375 g  Status:  Discontinued     3.375 g 100 mL/hr over 30 Minutes Intravenous 3 times per day 11/13/13 1035 11/13/13 1048   11/13/13 1400  piperacillin-tazobactam (ZOSYN) IVPB 3.375 g     3.375 g 12.5 mL/hr over 240 Minutes Intravenous 3 times per day 11/13/13 1052     11/13/13 0900  ertapenem (INVANZ) 1 g in sodium chloride 0.9 % 50 mL IVPB  Status:  Discontinued     1 g 100 mL/hr over 30 Minutes Intravenous Every 24 hours 11/13/13 0824 11/13/13 1112   11/13/13 0645  piperacillin-tazobactam (ZOSYN) IVPB 3.375 g     3.375  g 12.5 mL/hr over 240 Minutes Intravenous  Once 11/13/13 0636 11/13/13 0737      Assessment/Plan: Diverticulitis with perforation. CT personally reviewed. A small amount of scattered free air in the nondependent abdomen. Currently seems about the same or possibly slightly improved with decreased white count and improve symptoms. Still with marked tenderness. Slightly worsening renal function. Difficult decision regarding surgical treatment. Previous assessments by the admitting team indicated no surgery under any circumstances. Case discussed today with his cardiologist Dr. Johnsie Cancel an pulmonary MD Dr Chase Caller. They feel that the patient does not have overwhelming risks of surgery. I had a long discussion with the patient and his family regarding risks of operative and nonoperative management. I think that he is high risk with either course. With his white count decreased and some symptomatic improvement we will continue close monitoring and nonoperative management for now. We will be ready to proceed with surgical intervention if there area any signs of deterioration.    LOS: 1 day    Yaniel Limbaugh T 11/14/2013

## 2013-11-14 NOTE — Progress Notes (Addendum)
Patient ID: [name] [name], male   DOB: 25-Jul-1925, 80 y.o.   MRN: [id number]  [facility] PROGRESS NOTE  [place] Brigance QMG:[id number] DOB: 10/13/1925 DOA: 11/13/2013 PCP: [name], MD  Brief narrative: 80 year old male with a history of sudden cardiac arrest status post AICD, ischemic cardiomyopathy (EF31% by MRI), COPD (seen in ED 10/16 for acute flare up but went home per his wish), thalassemia, presented with acute onset of abdominal pain on the morning of admission, a/w some abdominal distention. He presented back to ED 10/18 with main concern of persistent dyspnea and abd pain. CT of the abdomen and pelvis in the ED revealed perforated diverticulitis with pneumoperitoneum. The patient was given a dose of Zosyn as well as fentanyl x 2  IV and albuterol nebulizer. General surgery, Dr. Zella Richer was consulted and recommendation was to continue with conservative management, avoiding steroids, pt not surgical candidate.   Assessment/Plan:  Sepsis, secondary to perforated diverticulitis, present at the time of the admission, ? UTI (Klebsiella)  - pt meets criteria for sepsis given hypotension, tachycardia, leukocytosis, temp 99.41F - Present at the time of admission  - no surgical intervention recommended at this time - continue supportive care with IVF, analgesia and antiemetics as needed - continue IVF and ABX but lower the rate of IVF as weight trending up: 185 lbs --> 187 lbs  Diverticulitis/Peritonitis  - pt has perforated diverticulitis  - management as noted above - please note steroids are contraindicated at this time, per surgery recommendations  Acute Respiratory Failure with Hypoxia - secondary to COPD exacerbation  - as discussed above--no systemic steroids  - continue inhaled pulmicort and brovana  - pt on Ventimask this AM and maintaining oxygen saturations at target range  - PCCM consulted, appreciate assistance  Acute COPD exacerbation  - continue Inhaled  pulmicort and brovana as noted above  - aerosolized albuterol and atrovent  Ischemic cardiomyopathy with history of sudden cardiac death, s/p AICD  - Patient had stent to Ramus, circ and RCA  - hold metoprolol and losartan due persistent hypotension  Thrombocytopenia  - Chronic after review of the medical record  - drop in Plt overnight: 125 --> 69 - stop Heparin SQ and place on SCD's - repeat CBC this afternoon  Acute on chronic blood loss anemia - Hg drop overnight: 10.6 --> 7.8, stop Heparin as noted above - there could be slight dilutional component as well so will lower the rate of IVF administration  - repeat CBC this afternoon to decide if transfusion indicated  Klebsiella UTI - per urine culture 10/16, sensitivity to Zosyn, therefore current ABX adequate  CKD stage 3  - baseline creatinine 1.6 - Cr continues trending up despite IVF administration  - will lower the rate of IVF administration from 100 cc/hr --> 50 cc/hr as weight is trending up: 185 lbs --> 187 lbs this AM Severe PCM - not meeting nutritional requirements due to acute condition outlined above  DVT Prophylaxis: Stop Heparin and change to SCD's  Code Status: Full.  Family Communication:  plan of care discussed with family  Disposition Plan: Keep in SDU    IV Access:   Peripheral IV Procedures and diagnostic studies:    Ct Abd/Pelvis Wo Contrast  11/13/2013   Sigmoid diverticulitis complicated by free perforation. Pneumoperitoneum is small to moderate volume. Bilateral complex renal cysts. Tiny lucencies throughout the skeleton is likely from the patient's significant osteopenia. Myeloma is the main differential consideration, after convalescence consider serum electrophoresis. Cholelithiasis.  CXR  11/13/2013   No acute cardiopulmonary process ; stable appearance of the chest from November 11, 2013.    Abd XRAY  11/13/2013  Negative for bowel obstruction.  Medical Consultants:   Surgery PCCM Cardiology   Other Consultants:   None  Anti-Infectives:   Zosyn 11/13/2013 -->   Leisa Lenz, MD  [facility] Pager [phone]  If 7PM-7AM, please contact night-coverage [email or web address] Password [email or web address] 11/14/2013, 11:03 AM   LOS: 1 day    HPI/Subjective: No acute overnight events.  Objective: Filed Vitals:   11/14/13 0700 11/14/13 0743 11/14/13 0800 11/14/13 0900  BP: 129/53  97/40 116/66  Pulse: 92  87 82  Temp:      TempSrc:      Resp: 21  21 21   Height:      Weight:      SpO2: 99% 95% 100% 99%    Intake/Output Summary (Last 24 hours) at 11/14/13 1103 Last data filed at 11/14/13 0924  Gross per 24 hour  Intake 2349.67 ml  Output   1060 ml  Net 1289.67 ml    Exam:   General: A&O x 3, toxic, pleasant/cooperative  CV: RRR, no rub   Lung: Bilateral scattered rhonchi with mild expiratory wheeze   Abdomen: distended/diffusely tender with guarding and rebound.   Ext: No cyanosis, No rashes, No lymphangitis, No edema   Neuro: CNII-XII intact, strength 4/5 in bilateral upper and lower extremities,   Data Reviewed: Basic Metabolic Panel:  Recent Labs Lab 11/11/13 2139 11/13/13 0455 11/14/13 0340  NA 139 140 139  K 4.4 4.7 4.0  CL 100 100 103  CO2 23 20 22   GLUCOSE 101* 163* 135*  BUN 33* 63* 74*  CREATININE 1.69* 2.08* 2.93*  CALCIUM 8.5 8.8 8.0*  MG  --   --  2.2  PHOS  --   --  4.4   Liver Function Tests:  Recent Labs Lab 11/13/13 0455 11/14/13 0340  AST 27 32  ALT 19 24  ALKPHOS 50 36*  BILITOT 0.3 0.5  PROT 7.3 6.1  ALBUMIN 3.4* 3.0*    Recent Labs Lab 11/13/13 0455  LIPASE 19   CBC:  Recent Labs Lab 11/11/13 2139 11/13/13 0455 11/14/13 0340  WBC 12.8* 16.0* 7.9  NEUTROABS 11.4* 14.8* 7.4  HGB 9.2* 10.6* 7.8*  HCT 28.8* 32.6* 23.9*  MCV 67.0* 66.1* 66.6*  PLT 82* 125* 69*   Cardiac Enzymes:  Recent Labs Lab 11/11/13 2139 11/13/13 0456  TROPONINI <0.30 <0.30    Recent Results (from the past 240 hour(s))  URINE  CULTURE     Status: None   Collection Time    11/11/13  9:57 PM      Result Value Ref Range Status   Specimen Description URINE, CATHETERIZED   Final   Special Requests NONE   Final   Culture  Setup Time     Final   Value: 11/12/2013 02:10     Performed at [facility]     Final   Value: >=100,000 COLONIES/ML     Performed at [facility]   Culture     Final   Value: KLEBSIELLA PNEUMONIAE     Performed at [facility]   Report Status 11/14/2013 FINAL   Final   Organism ID, Bacteria KLEBSIELLA PNEUMONIAE   Final  CULTURE, BLOOD (ROUTINE X 2)     Status: None   Collection Time    11/13/13  6:10 AM  Result Value Ref Range Status   Specimen Description BLOOD RIGHT ANTECUBITAL   Final   Special Requests BOTTLES DRAWN AEROBIC AND ANAEROBIC [facility] EACH   Final   Culture  Setup Time     Final   Value: 11/13/2013 15:19     Performed at [facility]   Culture     Final   Value:        BLOOD CULTURE RECEIVED NO GROWTH TO DATE CULTURE WILL BE HELD FOR 5 DAYS BEFORE ISSUING A FINAL NEGATIVE REPORT     Performed at [facility]   Report Status PENDING   Incomplete  CULTURE, BLOOD (ROUTINE X 2)     Status: None   Collection Time    11/13/13  6:11 AM      Result Value Ref Range Status   Specimen Description BLOOD LEFT ANTECUBITAL   Final   Special Requests BOTTLES DRAWN AEROBIC AND ANAEROBIC 5CC EACH   Final   Culture  Setup Time     Final   Value: 11/13/2013 15:19     Performed at [facility]   Culture     Final   Value:        BLOOD CULTURE RECEIVED NO GROWTH TO DATE CULTURE WILL BE HELD FOR 5 DAYS BEFORE ISSUING A FINAL NEGATIVE REPORT     Performed at [facility]   Report Status PENDING   Incomplete  MRSA PCR SCREENING     Status: None   Collection Time    11/13/13 10:05 AM      Result Value Ref Range Status   MRSA by PCR NEGATIVE  NEGATIVE Final   Comment:            The GeneXpert MRSA Assay (FDA      approved for NASAL specimens     only), is one component of a     comprehensive MRSA colonization     surveillance program. It is not     intended to diagnose MRSA     infection nor to guide or     monitor treatment for     MRSA infections.     Scheduled Meds: . arformoterol  15 mcg Nebulization BID  . budesonide  0.5 mg Nebulization BID  . heparin  5,000 Units Subcutaneous 3 times per day  . ipratropium-albuterol  3 mL Nebulization Q4H  . pantoprazole IV  40 mg Intravenous Q12H  . ZOSYN  IV  3.375 g Intravenous 3 times per day   Continuous Infusions: . sodium chloride 100 mL/hr at 11/14/13 0700

## 2013-11-15 ENCOUNTER — Telehealth (HOSPITAL_COMMUNITY): Payer: Self-pay

## 2013-11-15 DIAGNOSIS — I517 Cardiomegaly: Secondary | ICD-10-CM

## 2013-11-15 DIAGNOSIS — J441 Chronic obstructive pulmonary disease with (acute) exacerbation: Secondary | ICD-10-CM

## 2013-11-15 DIAGNOSIS — R140 Abdominal distension (gaseous): Secondary | ICD-10-CM

## 2013-11-15 DIAGNOSIS — N189 Chronic kidney disease, unspecified: Secondary | ICD-10-CM

## 2013-11-15 DIAGNOSIS — N179 Acute kidney failure, unspecified: Secondary | ICD-10-CM

## 2013-11-15 DIAGNOSIS — Z01811 Encounter for preprocedural respiratory examination: Secondary | ICD-10-CM | POA: Insufficient documentation

## 2013-11-15 LAB — BASIC METABOLIC PANEL
Anion gap: 15 (ref 5–15)
BUN: 67 mg/dL — ABNORMAL HIGH (ref 6–23)
CALCIUM: 8.4 mg/dL (ref 8.4–10.5)
CO2: 22 meq/L (ref 19–32)
Chloride: 108 mEq/L (ref 96–112)
Creatinine, Ser: 2.09 mg/dL — ABNORMAL HIGH (ref 0.50–1.35)
GFR calc Af Amer: 31 mL/min — ABNORMAL LOW (ref >90)
GFR calc non Af Amer: 27 mL/min — ABNORMAL LOW (ref >90)
Glucose, Bld: 114 mg/dL — ABNORMAL HIGH (ref 70–99)
Potassium: 4.3 mEq/L (ref 3.7–5.3)
Sodium: 145 mEq/L (ref 137–147)

## 2013-11-15 LAB — CBC WITH DIFFERENTIAL/PLATELET
Basophils Absolute: 0 10*3/uL (ref 0.0–0.1)
Basophils Relative: 0 % (ref 0–1)
Eosinophils Absolute: 0.1 10*3/uL (ref 0.0–0.7)
Eosinophils Relative: 1 % (ref 0–5)
HCT: 24.9 % — ABNORMAL LOW (ref 39.0–52.0)
HEMOGLOBIN: 8 g/dL — AB (ref 13.0–17.0)
Lymphocytes Relative: 5 % — ABNORMAL LOW (ref 12–46)
Lymphs Abs: 0.3 10*3/uL — ABNORMAL LOW (ref 0.7–4.0)
MCH: 21.6 pg — AB (ref 26.0–34.0)
MCHC: 32.1 g/dL (ref 30.0–36.0)
MCV: 67.3 fL — AB (ref 78.0–100.0)
MONO ABS: 0.2 10*3/uL (ref 0.1–1.0)
Monocytes Relative: 3 % (ref 3–12)
NEUTROS PCT: 91 % — AB (ref 43–77)
Neutro Abs: 6 10*3/uL (ref 1.7–7.7)
Platelets: 73 10*3/uL — ABNORMAL LOW (ref 150–400)
RBC: 3.7 MIL/uL — AB (ref 4.22–5.81)
RDW: 17.7 % — ABNORMAL HIGH (ref 11.5–15.5)
WBC: 6.6 10*3/uL (ref 4.0–10.5)

## 2013-11-15 LAB — MAGNESIUM: MAGNESIUM: 2.7 mg/dL — AB (ref 1.5–2.5)

## 2013-11-15 LAB — PHOSPHORUS: Phosphorus: 3.9 mg/dL (ref 2.3–4.6)

## 2013-11-15 MED ORDER — LORAZEPAM 2 MG/ML IJ SOLN
1.0000 mg | Freq: Once | INTRAMUSCULAR | Status: AC
  Administered 2013-11-15: 1 mg via INTRAVENOUS
  Filled 2013-11-15: qty 1

## 2013-11-15 MED ORDER — VITAMINS A & D EX OINT
TOPICAL_OINTMENT | CUTANEOUS | Status: AC
  Administered 2013-11-15: 18:00:00
  Filled 2013-11-15: qty 5

## 2013-11-15 NOTE — Progress Notes (Signed)
Patient ID: [name] [name], male   DOB: 18-Jun-1925, 80 y.o.   MRN: [id number]    Subjective: He feels better today. He denies any pain this morning. No bowel movements or flatus yet.  Objective: Vital signs in last 24 hours: Temp:  [97.5 F (36.4 C)-99.1 F (37.3 C)] 98.1 F (36.7 C) (10/20 0800) Pulse Rate:  [72-98] 72 (10/20 0900) Resp:  [15-43] 19 (10/20 0900) BP: (82-156)/(48-81) 82/60 mmHg (10/20 0900) SpO2:  [94 %-100 %] 99 % (10/20 0900) FiO2 (%):  [40 %] 40 % (10/20 0900) Weight:  [187 lb 6.3 oz (85 kg)] 187 lb 6.3 oz (85 kg) (10/20 0700)    Intake/Output from previous day: 10/19 0701 - 10/20 0700 In: 1318 [I.V.:668; IV Piggyback:650] Out: 1852 [Urine:1852] Intake/Output this shift: Total I/O In: 3 [I.V.:3] Out: 150 [Urine:150]  General appearance: alert, cooperative and moderately ill appearing but in no acute distress Resp: clear anteriorly with mild increased work of breathing GI: moderately distended but less so than yesterday. Few bowel sounds. Moderate tenderness lower abdomen with slight guarding and nontender in the upper abdomen, much improved from yesterday  Lab Results:   Recent Labs  11/14/13 1213 11/15/13 0406  WBC 9.2 6.6  HGB 7.9* 8.0*  HCT 24.2* 24.9*  PLT 84* 73*   BMET  Recent Labs  11/14/13 0340 11/15/13 0406  NA 139 145  K 4.0 4.3  CL 103 108  CO2 22 22  GLUCOSE 135* 114*  BUN 74* 67*  CREATININE 2.93* 2.09*  CALCIUM 8.0* 8.4     Studies/Results: No results found.  Anti-infectives: Anti-infectives   Start     Dose/Rate Route Frequency Ordered Stop   11/13/13 1400  piperacillin-tazobactam (ZOSYN) IVPB 3.375 g  Status:  Discontinued     3.375 g 100 mL/hr over 30 Minutes Intravenous 3 times per day 11/13/13 1035 11/13/13 1048   11/13/13 1400  piperacillin-tazobactam (ZOSYN) IVPB 3.375 g     3.375 g 12.5 mL/hr over 240 Minutes Intravenous 3 times per day 11/13/13 1052     11/13/13 0900  ertapenem (INVANZ) 1 g in sodium  chloride 0.9 % 50 mL IVPB  Status:  Discontinued     1 g 100 mL/hr over 30 Minutes Intravenous Every 24 hours 11/13/13 0824 11/13/13 1112   11/13/13 0645  piperacillin-tazobactam (ZOSYN) IVPB 3.375 g     3.375 g 12.5 mL/hr over 240 Minutes Intravenous  Once 11/13/13 0636 11/13/13 0737      Assessment/Plan: Diverticulitis with probable microperforation. COPD and cardiac issues. Clearly seems improved today with less pain and definitely less tenderness, vital signs stable and renal function improving. As per previous plan continue nonoperative management unless there are signs of deterioration. Discussed with the patient and family who are present in all questions answered.     LOS: 2 days    Mahasin Riviere T 11/15/2013

## 2013-11-15 NOTE — Progress Notes (Signed)
  Echocardiogram 2D Echocardiogram has been performed.  [name] [name] 11/15/2013, 2:45 PM

## 2013-11-15 NOTE — ED Notes (Signed)
Post ED Visit - Positive Culture Follow-up  Culture report reviewed by antimicrobial stewardship pharmacist: []  Wes Murdock, Pharm.D., BCPS [x]  Heide Guile, Pharm.D., BCPS []  Alycia Rossetti, Pharm.D., BCPS []  [place], Pharm.D., BCPS, AAHIVP []  Legrand Como, Pharm.D., BCPS, AAHIVP []  Carly Sabat, Pharm.D. []  Elenor Quinones, Pharm.D.  Positive urine culture Treated with cipro, organism sensitive to the same and no further patient follow-up is required at this time.  Ileene Musa 11/15/2013, 11:14 AM

## 2013-11-15 NOTE — Progress Notes (Addendum)
    Subjective:  He states that he is feeling a little bit better. His abdomen is feeling slightly better. Currently on facemask oxygen.  Creatinine down from 2.9-2.09.  Daughter in room.   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 ICD. Plavix stopped.  Objective:  Vital Signs in the last 24 hours: Temp:  [97.5 F (36.4 C)-99.1 F (37.3 C)] 97.5 F (36.4 C) (10/20 0400) Pulse Rate:  [73-98] 81 (10/20 0800) Resp:  [15-43] 17 (10/20 0800) BP: (113-156)/(48-81) 156/70 mmHg (10/20 0800) SpO2:  [94 %-100 %] 100 % (10/20 0800) FiO2 (%):  [40 %] 40 % (10/20 0800) Weight:  [187 lb 6.3 oz (85 kg)] 187 lb 6.3 oz (85 kg) (10/20 0700)  Intake/Output from previous day: 10/19 0701 - 10/20 0700 In: 1318 [I.V.:668; IV Piggyback:650] Out: 1852 [Urine:1852]   Physical Exam: General: Elderly well nourished, in moderate respiratory distress. Head:  Normocephalic and atraumatic. Lungs: Mild to moderate labored breathing, minimal wheezing currently. Heart: Normal S1 and S2.  Systolic ejection murmur, no rubs or gallops. ICD notable Abdomen: Distended  Extremities: No clubbing or cyanosis. No edema. Neurologic: Alert and oriented x 3.    Lab Results:  Recent Labs  11/14/13 1213 11/15/13 0406  WBC 9.2 6.6  HGB 7.9* 8.0*  PLT 84* 73*    Recent Labs  11/14/13 0340 11/15/13 0406  NA 139 145  K 4.0 4.3  CL 103 108  CO2 22 22  GLUCOSE 135* 114*  BUN 74* 67*  CREATININE 2.93* 2.09*    Recent Labs  11/13/13 0456  TROPONINI <0.30   Hepatic Function Panel  Recent Labs  11/14/13 0340  PROT 6.1  ALBUMIN 3.0*  AST 32  ALT 24  ALKPHOS 36*  BILITOT 0.5     Telemetry: Brief paroxysmal atrial tachycardia. Sinus rhythm currently. Personally viewed.   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 Kyla Balzarine notes. He has had personal discussions with surgery as well as critical care medicine. He is not opposed to him proceeding with surgery rather than him possibly continued to have multisystem organ failure. Candid family discussions, understand high risk/mortality. Dr. Excell Seltzer note reviewed as well. He states that he will be ready to proceed with surgical intervention if there any signs of deterioration, understand high risk with either course, surgery versus conservative.  Thankfully today, his creatinine is improving. He does state that he has decreased abdominal discomfort. He is still in mild to moderate respiratory distress on facemask oxygen.  ICD-all capabilities are active including ATP and defibrillation.  COPD-critical care medicine following. He is not DO NOT RESUSCITATE.  We will continue to follow.  SKAINS, [place] 11/15/2013, 8:07 AM

## 2013-11-15 NOTE — Plan of Care (Signed)
Problem: Phase II Progression Outcomes Goal: Vital signs remain stable Outcome: Progressing Patient has progressed significantly in the past 24 hours, vital signs including BP are stable and he states that he is having less pain then before.

## 2013-11-15 NOTE — Progress Notes (Addendum)
PULMONARY / CRITICAL CARE MEDICINE   Name: [name] [name] MRN: [id number] DOB: 04-Feb-1925    ADMISSION DATE:  11/13/2013 CONSULTATION DATE:  11/13/2013   REFERRING MD :  Dr Shanon Brow [facility]  CHIEF COMPLAINT:  Respiratory Distress in setting of COPD,  Cardiomyopathy, and GI perf. Being conservatively managed for GI perf  INITIAL PRESENTATION: see HPI  STUDIES /EVENTS 11/13/2013 - admit 10/20 looks better, less distressed.   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   The patient was in the emergency department on 11/11/2013. He was diagnosed with a COPD exacerbation. Admission was recommended, but the patient wanted to go home. He was sent home with prednisone and ciprofloxacin. He presented again today with shortness of breath that was not much better as well as abdominal pain. CT of the abdomen and pelvis in the emergency department revealed perforated diverticulitis with pneumoperitoneum. The patient was given a dose of Zosyn as well as fentanyl x2 IV and albuterol nebulizer. General surgery, Dr. Zella Richer was consulted to see the patient. After he evaluated the patient, the surgical plan was to treat the patient medically without any surgical intervention.  In the ER patient noticed to be tachypneic and wheezing but surgery strongly recommended against steroids given GI perf and need to medically manage due to high medical risk for post operative complications.   Patient nearly got intubated but improvved after nebs, o2 and ? O2. PCCM asked to consult for resp failure mgmt  Family and patient undecided about code status   SUBJECTIVE:  Awake and alert, on 40% v mask with sats 98%  VITAL SIGNS: Temp:  [97.5 F (36.4 C)-99.1 F (37.3 C)] 98.1 F (36.7 C) (10/20  0800) Pulse Rate:  [72-98] 79 (10/20 1000) Resp:  [15-43] 23 (10/20 1000) BP: (82-156)/(48-81) 120/73 mmHg (10/20 1000) SpO2:  [94 %-100 %] 99 % (10/20 1000) FiO2 (%):  [40 %] 40 % (10/20 1000) Weight:  [187 lb 6.3 oz (85 kg)] 187 lb 6.3 oz (85 kg) (10/20 0700) HEMODYNAMICS:   VENTILATOR SETTINGS: Vent Mode:  [-]  FiO2 (%):  [40 %] 40 % INTAKE / OUTPUT:  Intake/Output Summary (Last 24 hours) at 11/15/13 1043 Last data filed at 11/15/13 1000  Gross per 24 hour  Intake 1218.01 ml  Output   1732 ml  Net -513.99 ml    PHYSICAL EXAMINATION: General:  Elderly male. No major distress, pleasant and alert Neuro:  A.xo3. Speech normal. . Happy and in less distress HEENT:   Neck supple, no jvd/lan Cardiovascular: . Normal heart sounds RRR Lungs:  Mildly tachypneic.No resp acc muscle use. Decreased bs bases. Breathing easier today Abdomen:  Distended, mildly tender lower abdomen, decreased bs, less tender Musculoskeletal:  No cyanosis, No clubbing,. No edema, non tender Skin:  Intact anteriorly  LABS: PULMONARY  Recent Labs Lab 11/13/13 0501  HCO3 22.6  TCO2 21.9  O2SAT 23.3    CBC  Recent Labs Lab 11/14/13 0340 11/14/13 1213 11/15/13 0406  HGB 7.8* 7.9* 8.0*  HCT 23.9* 24.2* 24.9*  WBC 7.9 9.2 6.6  PLT 69* 84* 73*    COAGULATION No results found for this basename: INR,  in the last 168 hours  [place] Lab 11/11/13 2139 11/13/13 0456  TROPONINI <0.30 <0.30    Recent Labs Lab  11/11/13 2139  PROBNP 759.3*     CHEMISTRY  Recent Labs Lab 11/11/13 2139 11/13/13 0455 11/14/13 0340 11/15/13 0406  NA 139 140 139 145  K 4.4 4.7 4.0 4.3  CL 100 100 103 108  CO2 23 20 22 22   GLUCOSE 101* 163* 135* 114*  BUN 33* 63* 74* 67*  CREATININE 1.69* 2.08* 2.93* 2.09*  CALCIUM 8.5 8.8 8.0* 8.4  MG  --   --  2.2 2.7*  PHOS  --   --  4.4 3.9   Estimated Creatinine Clearance: 27.8 ml/min (by C-G formula based on Cr of  2.09).   LIVER  Recent Labs Lab 11/13/13 0455 11/14/13 0340  AST 27 32  ALT 19 24  ALKPHOS 50 36*  BILITOT 0.3 0.5  PROT 7.3 6.1  ALBUMIN 3.4* 3.0*     INFECTIOUS  Recent Labs Lab 11/11/13 2149 11/13/13 0452 11/14/13 0340  LATICACIDVEN 1.29 2.91* 1.4     ENDOCRINE CBG (last 3)  No results found for this basename: GLUCAP,  in the last 72 hours       IMAGING x48h No results found.       ASSESSMENT / PLAN:  PULMONARY OETT A:Hx of COPD. REcent AECOPD 11/11/13 and Rx with prednisone. REturned 11/13/2013 with sigmoid perf. CCS does not advise steroids. Now in resp distress  P:   Sccheduled nebs Scheduled pulmicort Hold off systemic steroids Intubate if worse if family so wishes Wean O2 as tolerated  CARDIOVASCULAR Chronic systlioc CHF with AICD A: BP/HR stable P:  Fluids per [facility]  RENAL Lab Results  Component Value Date   CREATININE 2.09* 11/15/2013   CREATININE 2.93* 11/14/2013   CREATININE 2.08* 11/13/2013    A:  Acute on chronic renal insuff. Worse 10/19 P:   Fluids per [facility]  GASTROINTESTINAL A:  Sigmoid perf P:   Per [facility] and [facility] Surg suggested medical managemnt  HEMATOLOGIC A:  At risk for anemia P:  Per [facility]  INFECTIOUS A:  Sigmoid perf P:   Abx per [facility]   ENDOCRINE CBG (last 3)  No results found for this basename: GLUCAP,  in the last 72 hours   A:  At risk for hyperglycemia   P:   ssi pe [facility]  NEUROLOGIC A:  intact P:   monitor   FAMILY Updates:  Wife, daughter, patient, updated  80 STAF MD summary: Full code. Medical management of perfed colon. Looking better with medical management. SOn updated at bedside. PCCM will be available again 11/17/2013   [facility] Minor ACNP Maryanna Shape PCCM Pager [phone] till 3 pm If no answer page [phone] 11/15/2013, 10:43 AM      Dr. Brand Males, M.D., [facility].C.P Pulmonary and Critical Care Medicine Staff Physician [facility] Pager: [phone], If no answer or between  15:00h - 7:00h: call [phone]  11/15/2013 12:36 PM

## 2013-11-15 NOTE — Progress Notes (Signed)
Patient ID: [name] [name], male   DOB: 20-Mar-1925, 80 y.o.   MRN: [id number] [facility] PROGRESS NOTE  Nathanial Arrighi YNW:[id number] DOB: 23-Aug-1925 DOA: 11/13/2013 PCP: [name], MD  Brief narrative: 80 year old male with a history of sudden cardiac arrest status post AICD, ischemic cardiomyopathy (EF31% by MRI), COPD (seen in ED 10/16 for acute flare up but went home per his wish), thalassemia, presented with acute onset of abdominal pain on the morning of the admission, a/w some abdominal distention. He presented back to ED 10/18 with main concern of persistent dyspnea and abd pain. CT of the abdomen and pelvis in the ED revealed perforated diverticulitis with pneumoperitoneum. Surgery was consulted and determined that patient has likely a high risk for surgery. Patient himself expressed he did not want to go through surgery although his family (his daughters) expressed they would want him to go through surgery if it is absolutely needed.   Assessment/Plan:    Principal Problem: Sepsis, secondary to perforated sigmoid diverticulitis / peritonitis / probable Klebsiella UTI  Sepsis criteria met with vitals including hypotension, tachycardia, leukocytosis, temp 99.6 F. CT abdomen confirms sigmoid diverticulitis complicated by free perforation. Urine culture on  11/11/2013 grew Klebsiella pneumoniae. Repeat urine culture showed no growth.   Surgical intervention not recommended at the time of the admission. please note steroids are contraindicated at this time, per surgery recommendations   Surgery and critical care re following. Appreciate their recommendations.   Continue supportive care with IVF, analgesia and antiemetics as needed   Continue present management with zosyn  Active Problems: Acute Respiratory Failure with Hypoxia / Acute COPD exacerbation   Secondary to COPD exacerbation.  Steroids contraindicated due to acute diverticulitis complicated by free  perforation.  Continue Ventimask to keep O2 saturation above 90%  Continue Inhaled pulmicort and brovana as noted above   Continue aerosolized albuterol and atrovent  Ischemic cardiomyopathy with history of sudden cardiac death, s/p AICD   Risk of bleeding so we will stop aspirin and plavix. Family also requested to stop those 2 medications.   Patient had stent to Ramus, circ and RCA.  Hold metoprolol and losartan due to hypotension. Thrombocytopenia   Likely due to thalassemia history.  Platelet count ranges 69 - 84. Stopped heparin and using SCD's for DVT prophylaxis. Acute on chronic blood loss anemia / anemia of chronic disease   Anemia due to history of thalassemia and CKD as well as new acute perforation / risk of bleeding.  Drop in hemoglobin could be dilutional (from 10.6 to 7.8).  Hemoglobin this morning 8.0. Continue to monitor CBC. Klebsiella UTI  Sensitive to zosyn per Urine culture report. CKD stage 3   Baseline creatinine 1.6.  Creatinine now improving with gentle IV hydration.  Severe PCM  Poor PO intake, risk of aspiration as well as acute infection and perforation.  DVT Prophylaxis:   Stopped Heparin due to thrombocytopenia and changed to SCD's    Code Status: Full.  Family Communication: plan of care discussed with family  Disposition Plan: Keep in SDU   IV Access:   Peripheral IV  Procedures and diagnostic studies:   Ct Abd/Pelvis Wo Contrast 11/13/2013 Sigmoid diverticulitis complicated by free perforation. Pneumoperitoneum is small to moderate volume. Bilateral complex renal cysts. Tiny lucencies throughout the skeleton is likely from the patient's significant osteopenia. Myeloma is the main differential consideration, after convalescence consider serum electrophoresis. Cholelithiasis.  CXR 11/13/2013 No acute cardiopulmonary process ; stable appearance of the chest from November 11, 2013.  Abd  XRAY 11/13/2013 Negative for bowel obstruction.    Medical Consultants:   Surgery (Dr. Excell Seltzer) PCCM  Cardiology (Dr. Jenkins Rouge)  Other Consultants:   None   Anti-Infectives:   Zosyn 11/13/2013 -->   Leisa Lenz, MD  [facility] Pager [phone]  If 7PM-7AM, please contact night-coverage [email or web address] Password [email or web address] 11/15/2013, 6:32 AM   LOS: 2 days    HPI/Subjective: No acute overnight events.  Objective: Filed Vitals:   11/15/13 0400 11/15/13 0421 11/15/13 0500 11/15/13 0600  BP: 113/51  128/56 132/62  Pulse: 79  81 81  Temp: 97.5 F (36.4 C)     TempSrc: Axillary     Resp: $Remo'19  18 21  'qjvhx$ Height:      Weight:      SpO2: 95% 99% 98% 100%    Intake/Output Summary (Last 24 hours) at 11/15/13 1791 Last data filed at 11/15/13 0605  Gross per 24 hour  Intake 1318.01 ml  Output   1852 ml  Net -533.99 ml    Exam:   General:  Pt is on ventimask  Cardiovascular: Regular rate and rhythm, S1/S2 appreciated   Respiratory: diminished, wheezing, coarse breath sounds   Abdomen: distended, diffusely tender  Extremities: pulses DP and PT palpable bilaterally; ecchymosis scattered    Neuro: Grossly nonfocal  Data Reviewed: Basic Metabolic Panel:  Recent Labs Lab 11/11/13 2139 11/13/13 0455 11/14/13 0340 11/15/13 0406  NA 139 140 139 145  K 4.4 4.7 4.0 4.3  CL 100 100 103 108  CO2 $Re'23 20 22 22  'Emp$ GLUCOSE 101* 163* 135* 114*  BUN 33* 63* 74* 67*  CREATININE 1.69* 2.08* 2.93* 2.09*  CALCIUM 8.5 8.8 8.0* 8.4  MG  --   --  2.2 2.7*  PHOS  --   --  4.4 3.9   Liver Function Tests:  Recent Labs Lab 11/13/13 0455 11/14/13 0340  AST 27 32  ALT 19 24  ALKPHOS 50 36*  BILITOT 0.3 0.5  PROT 7.3 6.1  ALBUMIN 3.4* 3.0*    Recent Labs Lab 11/13/13 0455  LIPASE 19   No results found for this basename: AMMONIA,  in the last 168 hours CBC:  Recent Labs Lab 11/11/13 2139 11/13/13 0455 11/14/13 0340 11/14/13 1213 11/15/13 0406  WBC 12.8* 16.0* 7.9 9.2 6.6  NEUTROABS 11.4*  14.8* 7.4  --  6.0  HGB 9.2* 10.6* 7.8* 7.9* 8.0*  HCT 28.8* 32.6* 23.9* 24.2* 24.9*  MCV 67.0* 66.1* 66.6* 65.4* 67.3*  PLT 82* 125* 69* 84* 73*   Cardiac Enzymes:  Recent Labs Lab 11/11/13 2139 11/13/13 0456  TROPONINI <0.30 <0.30   BNP: No components found with this basename: POCBNP,  CBG: No results found for this basename: GLUCAP,  in the last 168 hours  URINE CULTURE     Status: None   Collection Time    11/11/13  9:57 PM      Result Value Ref Range Status   Specimen Description URINE, CATHETERIZED   Final   Value: KLEBSIELLA PNEUMONIAE     Performed at [facility]   Report Status 11/14/2013 FINAL   Final   Organism ID, Bacteria KLEBSIELLA PNEUMONIAE   Final  CULTURE, BLOOD (ROUTINE X 2)     Status: None   Collection Time    11/13/13  6:10 AM      Result Value Ref Range Status   Specimen Description BLOOD RIGHT ANTECUBITAL   Final   Value:        BLOOD CULTURE  RECEIVED NO GROWTH TO DATE CULTURE WILL BE HELD FOR 5 DAYS BEFORE ISSUING A FINAL NEGATIVE REPORT     Performed at [facility]   Report Status PENDING   Incomplete  CULTURE, BLOOD (ROUTINE X 2)     Status: None   Collection Time    11/13/13  6:11 AM      Result Value Ref Range Status   Specimen Description BLOOD LEFT ANTECUBITAL   Final   Value:        BLOOD CULTURE RECEIVED NO GROWTH TO DATE CULTURE WILL BE HELD FOR 5 DAYS BEFORE ISSUING A FINAL NEGATIVE REPORT     Performed at [facility]   Report Status PENDING   Incomplete  URINE CULTURE     Status: None   Collection Time    11/13/13  6:50 AM      Result Value Ref Range Status   Specimen Description URINE, CATHETERIZED   Final   Value: NO GROWTH     Performed at [facility]   Report Status 11/14/2013 FINAL   Final  MRSA PCR SCREENING     Status: None   Collection Time    11/13/13 10:05 AM      Result Value Ref Range Status   MRSA by PCR NEGATIVE  NEGATIVE Final     Scheduled Meds: . arformoterol  15 mcg  Nebulization BID  . budesonide  0.5 mg Nebulization BID  . ipratropium-albuterol  3 mL Nebulization Q4H  . pantoprazole (PROTONIX) IV  40 mg Intravenous Q12H  . piperacillin-tazobactam (ZOSYN)  IV  3.375 g Intravenous 3 times per day   Continuous Infusions: . sodium chloride 50 mL/hr at 11/15/13 0222

## 2013-11-16 ENCOUNTER — Inpatient Hospital Stay (HOSPITAL_COMMUNITY): Payer: Medicare Other

## 2013-11-16 ENCOUNTER — Encounter (HOSPITAL_COMMUNITY): Payer: Self-pay | Admitting: *Deleted

## 2013-11-16 LAB — BASIC METABOLIC PANEL
Anion gap: 16 — ABNORMAL HIGH (ref 5–15)
BUN: 66 mg/dL — AB (ref 6–23)
CHLORIDE: 112 meq/L (ref 96–112)
CO2: 22 meq/L (ref 19–32)
CREATININE: 1.76 mg/dL — AB (ref 0.50–1.35)
Calcium: 8.7 mg/dL (ref 8.4–10.5)
GFR calc Af Amer: 39 mL/min — ABNORMAL LOW (ref >90)
GFR calc non Af Amer: 33 mL/min — ABNORMAL LOW (ref >90)
Glucose, Bld: 105 mg/dL — ABNORMAL HIGH (ref 70–99)
POTASSIUM: 4.6 meq/L (ref 3.7–5.3)
Sodium: 150 mEq/L — ABNORMAL HIGH (ref 137–147)

## 2013-11-16 LAB — CBC WITH DIFFERENTIAL/PLATELET
Basophils Absolute: 0 10*3/uL (ref 0.0–0.1)
Basophils Relative: 0 % (ref 0–1)
EOS ABS: 0.1 10*3/uL (ref 0.0–0.7)
Eosinophils Relative: 1 % (ref 0–5)
HCT: 25.2 % — ABNORMAL LOW (ref 39.0–52.0)
Hemoglobin: 8 g/dL — ABNORMAL LOW (ref 13.0–17.0)
Lymphocytes Relative: 8 % — ABNORMAL LOW (ref 12–46)
Lymphs Abs: 0.4 10*3/uL — ABNORMAL LOW (ref 0.7–4.0)
MCH: 21.2 pg — AB (ref 26.0–34.0)
MCHC: 31.7 g/dL (ref 30.0–36.0)
MCV: 66.7 fL — ABNORMAL LOW (ref 78.0–100.0)
MONO ABS: 0.2 10*3/uL (ref 0.1–1.0)
Monocytes Relative: 4 % (ref 3–12)
Neutro Abs: 4.4 10*3/uL (ref 1.7–7.7)
Neutrophils Relative %: 87 % — ABNORMAL HIGH (ref 43–77)
PLATELETS: 73 10*3/uL — AB (ref 150–400)
RBC: 3.78 MIL/uL — AB (ref 4.22–5.81)
RDW: 17.8 % — ABNORMAL HIGH (ref 11.5–15.5)
WBC: 5.1 10*3/uL (ref 4.0–10.5)

## 2013-11-16 LAB — EXPECTORATED SPUTUM ASSESSMENT W REFEX TO RESP CULTURE: Special Requests: NORMAL

## 2013-11-16 LAB — MAGNESIUM: Magnesium: 2.9 mg/dL — ABNORMAL HIGH (ref 1.5–2.5)

## 2013-11-16 LAB — PHOSPHORUS: Phosphorus: 3.6 mg/dL (ref 2.3–4.6)

## 2013-11-16 MED ORDER — SODIUM CHLORIDE 0.45 % IV SOLN
INTRAVENOUS | Status: DC
  Administered 2013-11-16: 16:00:00 via INTRAVENOUS

## 2013-11-16 NOTE — Progress Notes (Signed)
Ruthell Rummage, NP for [facility] contacted about pts abdominal status. Pt frequently is forcing himself to throw up in a hacking manner. He brings up thin, green/brown/bile colored content. States that he is trying to make his stomach feel better. When asked is he was nauseous/sick to his stomach, he just said that he was trying to feel better. NP called for opinion on maybe NG tube being appropriate? RN has seen the stomach contents color and sputum color be the same. Sputum sample sent tonight. NP said to go ahead and give Zofran to see if it will relieve discomfort and contact surgery for further questions. Pt is stable. Will continue to monitor.

## 2013-11-16 NOTE — Progress Notes (Signed)
Dr. Excell Seltzer had placed order to D/C foley catheter. Upon discussion with patient and family in regards to discontinuing foley, they stated that [name] self catherizes at home. Family would like to maintain foley catheter. Dr. Excell Seltzer was paged in regards to family wishes. Dr. Excell Seltzer would like the foley catheter discontinued and to in/out catheterize the patient as needed. Family verbalizes understanding and agrees with plan of care. Will continue to monitor the patient.

## 2013-11-16 NOTE — Plan of Care (Signed)
Problem: Phase II Progression Outcomes Goal: Obtain order to discontinue catheter if appropriate Outcome: Completed/Met Date Met:  11/16/13 Removed foley. Patient does in/out cath at home. Will continue practice here.

## 2013-11-16 NOTE — Progress Notes (Signed)
Patient ID: [name] [name], male   DOB: 1925-07-03, 80 y.o.   MRN: [id number]    Subjective: Continues to feel gradually better. He denies any abdominal pain. He has been having some regurgitation or possibly small volume emesis last night but denies any nausea this morning. No flatus or bowel movements.  Objective: Vital signs in last 24 hours: Temp:  [98 F (36.7 C)-99.1 F (37.3 C)] 99.1 F (37.3 C) (10/21 0400) Pulse Rate:  [67-95] 67 (10/21 0800) Resp:  [16-33] 18 (10/21 0800) BP: (82-170)/(52-100) 140/57 mmHg (10/21 0800) SpO2:  [84 %-99 %] 97 % (10/21 0800) FiO2 (%):  [31 %-40 %] 31 % (10/21 0800) Weight:  [179 lb 10.8 oz (81.5 kg)] 179 lb 10.8 oz (81.5 kg) (10/21 0500)    Intake/Output from previous day: 10/20 0701 - 10/21 0700 In: 853 [I.V.:703; IV Piggyback:150] Out: 1905 [Urine:1905] Intake/Output this shift: Total I/O In: 0  Out: 275 [Urine:275]  General appearance: alert, cooperative and mild distress GI: a few high-pitched bowel sounds. Soft and nontender. Still moderately distended but less than yesterday.  Lab Results:   Recent Labs  11/15/13 0406 11/16/13 0320  WBC 6.6 5.1  HGB 8.0* 8.0*  HCT 24.9* 25.2*  PLT 73* 73*   BMET  Recent Labs  11/15/13 0406 11/16/13 0320  NA 145 150*  K 4.3 4.6  CL 108 112  CO2 22 22  GLUCOSE 114* 105*  BUN 67* 66*  CREATININE 2.09* 1.76*  CALCIUM 8.4 8.7     Studies/Results: Dg Chest Port 1 View  11/16/2013   CLINICAL DATA:  80 year old with hypoxia and respiratory distress in this setting of COPD. History of cardiomyopathy.  EXAM: PORTABLE CHEST - 1 VIEW  COMPARISON:  11/13/2013  FINDINGS: Left cardiac ICD appears stable in position. Heart size is stable and within normal limits. Evidence for underlying hyperinflation. There is no focal airspace disease. The thoracic aorta is prominent and similar to the previous examination. No evidence for a pneumothorax. The left costophrenic angle is incompletely imaged.   IMPRESSION: No acute chest findings.   Electronically Signed   By: Markus Daft M.D.   On: 11/16/2013 07:54    Anti-infectives: Anti-infectives   Start     Dose/Rate Route Frequency Ordered Stop   11/13/13 1400  piperacillin-tazobactam (ZOSYN) IVPB 3.375 g  Status:  Discontinued     3.375 g 100 mL/hr over 30 Minutes Intravenous 3 times per day 11/13/13 1035 11/13/13 1048   11/13/13 1400  piperacillin-tazobactam (ZOSYN) IVPB 3.375 g     3.375 g 12.5 mL/hr over 240 Minutes Intravenous 3 times per day 11/13/13 1052     11/13/13 0900  ertapenem (INVANZ) 1 g in sodium chloride 0.9 % 50 mL IVPB  Status:  Discontinued     1 g 100 mL/hr over 30 Minutes Intravenous Every 24 hours 11/13/13 0824 11/13/13 1112   11/13/13 0645  piperacillin-tazobactam (ZOSYN) IVPB 3.375 g     3.375 g 12.5 mL/hr over 240 Minutes Intravenous  Once 11/13/13 0636 11/13/13 0737      Assessment/Plan: Diverticulitis with probable microperforation and significant comorbidities with pulmonary and cardiac disease. Being managed nonoperatively. He appears to be improving daily. Abdomen today is nontender. Pulmonary function stable and renal function improving. He likely has some degree of ileus. Check KUB today. Continue n.p.o. For now. Continue IV antibiotics. Out of bed. Discontinue Foley.     LOS: 3 days    Dakota Stangl T 11/16/2013

## 2013-11-16 NOTE — Progress Notes (Signed)
    Subjective:  Feeling better. No CP. Daughters in room. Telling me about travels to [place].   Objective:  Vital Signs in the last 24 hours: Temp:  [98 F (36.7 C)-99.1 F (37.3 C)] 99.1 F (37.3 C) (10/21 0400) Pulse Rate:  [67-95] 67 (10/21 0800) Resp:  [16-33] 18 (10/21 0800) BP: (92-170)/(52-100) 140/57 mmHg (10/21 0800) SpO2:  [84 %-99 %] 97 % (10/21 0800) FiO2 (%):  [31 %-40 %] 31 % (10/21 0800) Weight:  [179 lb 10.8 oz (81.5 kg)] 179 lb 10.8 oz (81.5 kg) (10/21 0500)  Intake/Output from previous day: 10/20 0701 - 10/21 0700 In: 853 [I.V.:703; IV Piggyback:150] Out: 1905 [Urine:1905]   Physical Exam: General: Elderly, appears more comfortable, in no acute distress. Head:  Normocephalic and atraumatic. Lungs: Mildly increased resp effort, Clear to auscultation and percussion. Heart: Normal S1 and S2.  No murmur, rubs or gallops. ICD Abdomen: distended Extremities: No clubbing or cyanosis. No edema. Neurologic: Alert and oriented x 3.    Lab Results:  Recent Labs  11/15/13 0406 11/16/13 0320  WBC 6.6 5.1  HGB 8.0* 8.0*  PLT 73* 73*    Recent Labs  11/15/13 0406 11/16/13 0320  NA 145 150*  K 4.3 4.6  CL 108 112  CO2 22 22  GLUCOSE 114* 105*  BUN 67* 66*  CREATININE 2.09* 1.76*   Hepatic Function Panel  Recent Labs  11/14/13 0340  PROT 6.1  ALBUMIN 3.0*  AST 32  ALT 24  ALKPHOS 36*  BILITOT 0.5    Imaging: Dg Chest Port 1 View  11/16/2013   CLINICAL DATA:  80 year old with hypoxia and respiratory distress in this setting of COPD. History of cardiomyopathy.  EXAM: PORTABLE CHEST - 1 VIEW  COMPARISON:  11/13/2013  FINDINGS: Left cardiac ICD appears stable in position. Heart size is stable and within normal limits. Evidence for underlying hyperinflation. There is no focal airspace disease. The thoracic aorta is prominent and similar to the previous examination. No evidence for a pneumothorax. The left costophrenic angle is incompletely imaged.   IMPRESSION: No acute chest findings.   Electronically Signed   By: Markus Daft M.D.   On: 11/16/2013 07:54     Telemetry: NSR Personally viewed.  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  Colonic microperforation - surgery notes, Dr. Excell Seltzer, reviewed. With conservative mgt, gradually improving. If intervention is needed, proceed. High risk from cardiac perspective. Dr. Johnsie Cancel has had discussions with multiple teams regarding this.   ICD - normal, all capabilities are active including ATP and defibrillation.  ACUTE RENAL FAILURE - improving, Creat 2.9>>2.0>>1.7  COPD - CCM, FULL CODE    SKAINS, MARK 11/16/2013, 9:28 AM

## 2013-11-16 NOTE — Plan of Care (Signed)
Problem: Phase III Progression Outcomes Goal: Foley discontinued Outcome: Completed/Met Date Met:  11/16/13 Foley removed. Pt does in/out cath at home. Will continue this practice here.

## 2013-11-16 NOTE — Plan of Care (Signed)
Problem: Phase II Progression Outcomes Goal: Progress activity as tolerated unless otherwise ordered Up to chair today

## 2013-11-16 NOTE — Progress Notes (Signed)
[facility] PROGRESS NOTE  Jakari Sada UKG:[id number] DOB: Sep 10, 1925 DOA: 11/13/2013 PCP: [name], MD Brief narrative 80 year old male with a history of sudden cardiac arrest status post AICD, ischemic cardiomyopathy (EF31% by MRI), COPD (seen in ED 10/16 for acute flare up but went home per his wish), thalassemia, presented with acute onset of abdominal pain on the morning of the admission, a/w some abdominal distention. He presented back to ED 10/18 with main concern of persistent dyspnea and abd pain. CT of the abdomen and pelvis in the ED revealed perforated diverticulitis with pneumoperitoneum. Surgery was consulted and determined that patient has likely a high risk for surgery. Patient himself expressed he did not want to go through surgery although his daughters expressed they would want him to go through surgery if it necessary.  Assessment/Plan: Principal Problem:  Sepsis, secondary to perforated sigmoid diverticulitis / peritonitis / probable Klebsiella UTI   CT abdomen confirms sigmoid diverticulitis complicated by free perforation. Urine culture on 11/11/2013 grew Klebsiella pneumoniae. Repeat urine culture - no growth.  -Surgery following and given high-risk recommend medical management for now with serial abdominal exam, pain control , IV fluids and antibiotics. -Abdominal clinical exam and reportedly improved with yes distention and absence of pain. Followup x-ray still shows diffuse adynamic ileus. Continue n.p.o. -Continue empiric Zosyn. Repeat abdominal x-ray in a.m.  Active Problems:  Acute Respiratory Failure with Hypoxia Secondary to COPD exacerbation and resp distress with underlying sepsis .  Steroids contraindicated due to acute diverticulitis complicated by free perforation.  -Patient on nighttime 2 L O2 via nasal cannula at home. Currently On Ventimask and feels comfortable being on it.  Continue Inhaled pulmicort and brovana  Continue  nebs  Ischemic  cardiomyopathy with history of sudden cardiac death, s/p AICD   aspirin and plavix discontinued due to high risk of bleeding. Hold metoprolol and losartan due to hypotension.  Chronic Thrombocytopenia  Likely due to thalassemia..   Acute on chronic blood loss anemia / anemia of chronic disease  I drop in H&H noted. Stable.  Klebsiella UTI  On empiric Zosyn and sensitive  Acute on CKD stage 3  Baseline creatinine of 1.6. Improving with hydration. Switch fluid to half normal given hyponatremia  Severe PCM  Poor PO intake and currently n.p.o. Nutrition consult once taking by mouth  DVT Prophylaxis:     SCD's  Code Status: Full.  Family Communication: Daughter is at bedside  Disposition Plan: Continue stepdown monitoring    Consultants:  CCS  PCCM  cardilogy  Procedures:  CT abdomen and pelvis  Antibiotics:  IV Zosyn since 10/18  HPI/Subjective: Patient seen and examined. Denies any abdominal pain today. Reportedly had few episodes of vomiting ( spitting) of bile overnight)  Objective: Filed Vitals:   11/16/13 1500  BP: 167/66  Pulse: 79  Temp:   Resp: 24    Intake/Output Summary (Last 24 hours) at 11/16/13 1526 Last data filed at 11/16/13 1500  Gross per 24 hour  Intake   1448 ml  Output   1880 ml  Net   -432 ml   Filed Weights   11/14/13 0400 11/15/13 0700 11/16/13 0500  Weight: 85 kg (187 lb 6.3 oz) 85 kg (187 lb 6.3 oz) 81.5 kg (179 lb 10.8 oz)    Exam:   General:  Elderly thin built male on Ventimask  HEENT: Pallor present, dry oral mucosa,   Cardiovascular: S1 and S2 regular, AICD in place  Respiratory: Clear to auscultation bilaterally, no rhonchi wheeze  Abdomen: Soft, distended, nontender, bowel sounds present, only in place  Extremities: Warm, no edema  CNS: Alert and oriented    Data Reviewed: Basic Metabolic Panel:  Recent Labs Lab 11/11/13 2139 11/13/13 0455 11/14/13 0340 11/15/13 0406 11/16/13 0320  NA 139 140  139 145 150*  K 4.4 4.7 4.0 4.3 4.6  CL 100 100 103 108 112  CO2 23 20 22 22 22   GLUCOSE 101* 163* 135* 114* 105*  BUN 33* 63* 74* 67* 66*  CREATININE 1.69* 2.08* 2.93* 2.09* 1.76*  CALCIUM 8.5 8.8 8.0* 8.4 8.7  MG  --   --  2.2 2.7* 2.9*  PHOS  --   --  4.4 3.9 3.6   Liver Function Tests:  Recent Labs Lab 11/13/13 0455 11/14/13 0340  AST 27 32  ALT 19 24  ALKPHOS 50 36*  BILITOT 0.3 0.5  PROT 7.3 6.1  ALBUMIN 3.4* 3.0*    Recent Labs Lab 11/13/13 0455  LIPASE 19   No results found for this basename: AMMONIA,  in the last 168 hours CBC:  Recent Labs Lab 11/11/13 2139 11/13/13 0455 11/14/13 0340 11/14/13 1213 11/15/13 0406 11/16/13 0320  WBC 12.8* 16.0* 7.9 9.2 6.6 5.1  NEUTROABS 11.4* 14.8* 7.4  --  6.0 4.4  HGB 9.2* 10.6* 7.8* 7.9* 8.0* 8.0*  HCT 28.8* 32.6* 23.9* 24.2* 24.9* 25.2*  MCV 67.0* 66.1* 66.6* 65.4* 67.3* 66.7*  PLT 82* 125* 69* 84* 73* 73*   Cardiac Enzymes:  Recent Labs Lab 11/11/13 2139 11/13/13 0456  TROPONINI <0.30 <0.30   BNP (last 3 results)  Recent Labs  07/01/13 0625 11/11/13 2139  PROBNP 330.2 759.3*   CBG: No results found for this basename: GLUCAP,  in the last 168 hours  Recent Results (from the past 240 hour(s))  URINE CULTURE     Status: None   Collection Time    11/11/13  9:57 PM      Result Value Ref Range Status   Specimen Description URINE, CATHETERIZED   Final   Special Requests NONE   Final   Culture  Setup Time     Final   Value: 11/12/2013 02:10     Performed at [place]     Final   Value: >=100,000 COLONIES/ML     Performed at [facility]   Culture     Final   Value: KLEBSIELLA PNEUMONIAE     Performed at [facility]   Report Status 11/14/2013 FINAL   Final   Organism ID, Bacteria KLEBSIELLA PNEUMONIAE   Final  CULTURE, BLOOD (ROUTINE X 2)     Status: None   Collection Time    11/13/13  6:10 AM      Result Value Ref Range Status   Specimen  Description BLOOD RIGHT ANTECUBITAL   Final   Special Requests BOTTLES DRAWN AEROBIC AND ANAEROBIC 5CC EACH   Final   Culture  Setup Time     Final   Value: 11/13/2013 15:19     Performed at [facility]   Culture     Final   Value:        BLOOD CULTURE RECEIVED NO GROWTH TO DATE CULTURE WILL BE HELD FOR 5 DAYS BEFORE ISSUING A FINAL NEGATIVE REPORT     Performed at [facility]   Report Status PENDING   Incomplete  CULTURE, BLOOD (ROUTINE X 2)     Status: None   Collection Time  11/13/13  6:11 AM      Result Value Ref Range Status   Specimen Description BLOOD LEFT ANTECUBITAL   Final   Special Requests BOTTLES DRAWN AEROBIC AND ANAEROBIC [facility] EACH   Final   Culture  Setup Time     Final   Value: 11/13/2013 15:19     Performed at [facility]   Culture     Final   Value:        BLOOD CULTURE RECEIVED NO GROWTH TO DATE CULTURE WILL BE HELD FOR 5 DAYS BEFORE ISSUING A FINAL NEGATIVE REPORT     Performed at [facility]   Report Status PENDING   Incomplete  URINE CULTURE     Status: None   Collection Time    11/13/13  6:50 AM      Result Value Ref Range Status   Specimen Description URINE, CATHETERIZED   Final   Special Requests NONE   Final   Culture  Setup Time     Final   Value: 11/13/2013 15:40     Performed at [place]     Final   Value: NO GROWTH     Performed at [facility]   Culture     Final   Value: NO GROWTH     Performed at [facility]   Report Status 11/14/2013 FINAL   Final  MRSA PCR SCREENING     Status: None   Collection Time    11/13/13 10:05 AM      Result Value Ref Range Status   MRSA by PCR NEGATIVE  NEGATIVE Final   Comment:            The GeneXpert MRSA Assay (FDA     approved for NASAL specimens     only), is one component of a     comprehensive MRSA colonization     surveillance program. It is not     intended to diagnose MRSA     infection nor to guide or     monitor  treatment for     MRSA infections.  CULTURE, EXPECTORATED SPUTUM-ASSESSMENT     Status: None   Collection Time    11/15/13 11:51 PM      Result Value Ref Range Status   Specimen Description SPUTUM   Final   Special Requests Normal   Final   Sputum evaluation     Final   Value: THIS SPECIMEN IS ACCEPTABLE. RESPIRATORY CULTURE REPORT TO FOLLOW.   Report Status 11/16/2013 FINAL   Final  CULTURE, RESPIRATORY (NON-EXPECTORATED)     Status: None   Collection Time    11/15/13 11:51 PM      Result Value Ref Range Status   Specimen Description SPUTUM   Final   Special Requests NONE   Final   Gram Stain     Final   Value: FEW WBC PRESENT, PREDOMINANTLY PMN     FEW SQUAMOUS EPITHELIAL CELLS PRESENT     RARE YEAST     Performed at [facility]   Culture PENDING   Incomplete   Report Status PENDING   Incomplete     Studies: Dg Chest Port 1 View  11/16/2013   CLINICAL DATA:  80 year old with hypoxia and respiratory distress in this setting of COPD. History of cardiomyopathy.  EXAM: PORTABLE CHEST - 1 VIEW  COMPARISON:  11/13/2013  FINDINGS: Left cardiac ICD appears stable in position. Heart size is stable  and within normal limits. Evidence for underlying hyperinflation. There is no focal airspace disease. The thoracic aorta is prominent and similar to the previous examination. No evidence for a pneumothorax. The left costophrenic angle is incompletely imaged.  IMPRESSION: No acute chest findings.   Electronically Signed   By: Markus Daft M.D.   On: 11/16/2013 07:54   Dg Abd Portable 2v  11/16/2013   CLINICAL DATA:  Abdominal distention. Recent CT demonstrated sigmoid diverticulitis complicated by perforation and pneumoperitoneum.  EXAM: PORTABLE ABDOMEN - 2 VIEW  COMPARISON:  CT, 11/13/2013.  FINDINGS: The small amount of free intraperitoneal air noted on CT is not visible radiographically.  Stomach is distended. There is mild dilation of small bowel with multiple air-fluid levels, findings  consistent with a diffuse adynamic ileus.  There are advanced degenerative changes throughout the visualized spine.  IMPRESSION: 1. The small amount of free air seen on the recent prior CT is not evident radiographically. 2. Gastric distention and mild small bowel dilation with associated air-fluid levels the most consistent with a diffuse adynamic ileus.   Electronically Signed   By: Lajean Manes M.D.   On: 11/16/2013 11:34    Scheduled Meds: . antiseptic oral rinse  7 mL Mouth Rinse BID  . arformoterol  15 mcg Nebulization BID  . budesonide  0.5 mg Nebulization BID  . ipratropium-albuterol  3 mL Nebulization Q4H  . pantoprazole (PROTONIX) IV  40 mg Intravenous Q12H  . piperacillin-tazobactam (ZOSYN)  IV  3.375 g Intravenous 3 times per day  . sodium chloride  3 mL Intravenous Q12H   Continuous Infusions: . sodium chloride 50 mL/hr at 11/16/13 0800      Time spent: 35 minutes    Laurette Villescas  [facility] Pager [phone]. If 7PM-7AM, please contact night-coverage at [email or web address], password [facility] 11/16/2013, 3:26 PM  LOS: 3 days

## 2013-11-17 ENCOUNTER — Inpatient Hospital Stay (HOSPITAL_COMMUNITY): Payer: Medicare Other

## 2013-11-17 DIAGNOSIS — E870 Hyperosmolality and hypernatremia: Secondary | ICD-10-CM

## 2013-11-17 DIAGNOSIS — K631 Perforation of intestine (nontraumatic): Secondary | ICD-10-CM

## 2013-11-17 DIAGNOSIS — I5022 Chronic systolic (congestive) heart failure: Secondary | ICD-10-CM

## 2013-11-17 LAB — MAGNESIUM: Magnesium: 2.8 mg/dL — ABNORMAL HIGH (ref 1.5–2.5)

## 2013-11-17 LAB — BASIC METABOLIC PANEL
ANION GAP: 13 (ref 5–15)
BUN: 61 mg/dL — ABNORMAL HIGH (ref 6–23)
CALCIUM: 8.8 mg/dL (ref 8.4–10.5)
CHLORIDE: 114 meq/L — AB (ref 96–112)
CO2: 24 mEq/L (ref 19–32)
Creatinine, Ser: 1.59 mg/dL — ABNORMAL HIGH (ref 0.50–1.35)
GFR calc Af Amer: 44 mL/min — ABNORMAL LOW (ref >90)
GFR calc non Af Amer: 38 mL/min — ABNORMAL LOW (ref >90)
GLUCOSE: 96 mg/dL (ref 70–99)
Potassium: 4.4 mEq/L (ref 3.7–5.3)
SODIUM: 151 meq/L — AB (ref 137–147)

## 2013-11-17 LAB — CBC WITH DIFFERENTIAL/PLATELET
Basophils Absolute: 0 10*3/uL (ref 0.0–0.1)
Basophils Relative: 0 % (ref 0–1)
EOS PCT: 2 % (ref 0–5)
Eosinophils Absolute: 0.1 10*3/uL (ref 0.0–0.7)
HEMATOCRIT: 25.3 % — AB (ref 39.0–52.0)
Hemoglobin: 8.1 g/dL — ABNORMAL LOW (ref 13.0–17.0)
LYMPHS ABS: 0.5 10*3/uL — AB (ref 0.7–4.0)
Lymphocytes Relative: 12 % (ref 12–46)
MCH: 21.5 pg — ABNORMAL LOW (ref 26.0–34.0)
MCHC: 32 g/dL (ref 30.0–36.0)
MCV: 67.1 fL — AB (ref 78.0–100.0)
Monocytes Absolute: 0.2 10*3/uL (ref 0.1–1.0)
Monocytes Relative: 5 % (ref 3–12)
NEUTROS ABS: 3.2 10*3/uL (ref 1.7–7.7)
Neutrophils Relative %: 81 % — ABNORMAL HIGH (ref 43–77)
Platelets: 77 10*3/uL — ABNORMAL LOW (ref 150–400)
RBC: 3.77 MIL/uL — AB (ref 4.22–5.81)
RDW: 17.6 % — ABNORMAL HIGH (ref 11.5–15.5)
WBC: 4 10*3/uL (ref 4.0–10.5)

## 2013-11-17 LAB — PHOSPHORUS: PHOSPHORUS: 3.9 mg/dL (ref 2.3–4.6)

## 2013-11-17 MED ORDER — LORAZEPAM 2 MG/ML IJ SOLN
1.0000 mg | Freq: Once | INTRAMUSCULAR | Status: AC
  Administered 2013-11-17: 1 mg via INTRAVENOUS
  Filled 2013-11-17: qty 1

## 2013-11-17 MED ORDER — HYDRALAZINE HCL 20 MG/ML IJ SOLN: 10.0000 mg | Freq: Four times a day (QID) | INTRAMUSCULAR | Status: DC | PRN

## 2013-11-17 MED ORDER — PHENOL 1.4 % MT LIQD
1.0000 | OROMUCOSAL | Status: DC | PRN
  Filled 2013-11-17: qty 177

## 2013-11-17 MED ORDER — MAGIC MOUTHWASH
10.0000 mL | Freq: Four times a day (QID) | ORAL | Status: DC
  Administered 2013-11-17: 5 mL via ORAL
  Administered 2013-11-17 – 2013-11-19 (×2): 10 mL via ORAL
  Filled 2013-11-17 (×14): qty 10

## 2013-11-17 MED ORDER — DEXTROSE-NACL 5-0.45 % IV SOLN
INTRAVENOUS | Status: DC
  Administered 2013-11-17 (×2): via INTRAVENOUS
  Administered 2013-11-19: 75 mL via INTRAVENOUS

## 2013-11-17 NOTE — Progress Notes (Signed)
Pt with greenish sputum noted. Mid level paged awaiting call back.

## 2013-11-17 NOTE — Progress Notes (Signed)
Pt request lidocaine for nose, something to help rest and something to drink. Called md on call awaiting call back.

## 2013-11-17 NOTE — Progress Notes (Signed)
PULMONARY / CRITICAL CARE MEDICINE   Name: [name] [name] MRN: [id number] DOB: 04-22-1925    ADMISSION DATE:  11/13/2013 CONSULTATION DATE:  11/13/2013   REFERRING MD :  Dr Shanon Brow [facility] of [facility]  CHIEF COMPLAINT:  Respiratory Distress in setting of COPD,  Cardiomyopathy, and GI perf. Being conservatively managed for GI perf  INITIAL PRESENTATION: see HPI  STUDIES /EVENTS 11/13/2013 - admit 10/20 looks better, less distressed.  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   The patient was in the emergency department on 11/11/2013. He was diagnosed with a COPD exacerbation. Admission was recommended, but the patient wanted to go home. He was sent home with prednisone and ciprofloxacin. He presented again today with shortness of breath that was not much better as well as abdominal pain. CT of the abdomen and pelvis in the emergency department revealed perforated diverticulitis with pneumoperitoneum. The patient was given a dose of Zosyn as well as fentanyl x2 IV and albuterol nebulizer. General surgery, Dr. Zella Richer was consulted to see the patient. After he evaluated the patient, the surgical plan was to treat the patient medically without any surgical intervention.  In the ER patient noticed to be tachypneic and wheezing but surgery strongly recommended against steroids given GI perf and need to medically manage due to high medical risk for post operative complications.   Patient nearly got intubated but improvved after nebs, o2 and ? O2. PCCM asked to consult for resp failure mgmt  Family and patient undecided about code status   SUBJECTIVE:  Awake and alert, on 31% v mask with sats 92%. No resp distress.  VITAL SIGNS: Temp:  [97.6 F (36.4 C)-99.1 F  (37.3 C)] 97.8 F (36.6 C) (10/22 0800) Pulse Rate:  [59-84] 74 (10/22 0900) Resp:  [15-24] 20 (10/22 0900) BP: (82-198)/(44-135) 146/52 mmHg (10/22 1000) SpO2:  [89 %-100 %] 89 % (10/22 0900) FiO2 (%):  [31 %] 31 % (10/22 0821) Weight:  [188 lb 15 oz (85.7 kg)] 188 lb 15 oz (85.7 kg) (10/22 0419) HEMODYNAMICS:   VENTILATOR SETTINGS: Vent Mode:  [-]  FiO2 (%):  [31 %] 31 % INTAKE / OUTPUT:  Intake/Output Summary (Last 24 hours) at 11/17/13 1052 Last data filed at 11/17/13 1000  Gross per 24 hour  Intake 1234.17 ml  Output   2525 ml  Net -1290.83 ml    PHYSICAL EXAMINATION: General:  Elderly male. No major distress, pleasant and alert Neuro:  A.xo3. Speech normal. . Happy and in less distress. Doesn't like NGT HEENT:   Neck supple, no jvd/lan. NGT in place Cardiovascular: . Normal heart sounds RRR Lungs:  Mildly tachypneic.No resp acc muscle use. Decreased bs bases. Breathing easier today Abdomen:  Distended, mildly tender lower abdomen, decreased bs,  tender Musculoskeletal:  No cyanosis, No clubbing,. No edema, non tender Skin:  Intact anteriorly  LABS: PULMONARY  Recent Labs Lab 11/13/13 0501  HCO3 22.6  TCO2 21.9  O2SAT 23.3    CBC  Recent Labs Lab 11/15/13 0406 11/16/13 0320 11/17/13 0320  HGB 8.0* 8.0* 8.1*  HCT 24.9* 25.2* 25.3*  WBC 6.6 5.1 4.0  PLT 73* 73* 77*    COAGULATION No results found for this basename: INR,  in the last 168 hours  [place] [facility]  11/11/13 2139 11/13/13 0456  TROPONINI <0.30 <0.30    Recent Labs Lab 11/11/13 2139  PROBNP 759.3*     CHEMISTRY  Recent Labs Lab 11/13/13 0455 11/14/13 0340 11/15/13 0406 11/16/13 0320 11/17/13 0320 11/17/13 0330  NA 140 139 145 150*  --  151*  K 4.7 4.0 4.3 4.6  --  4.4  CL 100 103 108 112  --  114*  CO2 20 22 22 22   --  24  GLUCOSE 163* 135* 114* 105*  --  96  BUN 63* 74* 67* 66*  --  61*  CREATININE 2.08* 2.93* 2.09* 1.76*  --  1.59*  CALCIUM 8.8  8.0* 8.4 8.7  --  8.8  MG  --  2.2 2.7* 2.9* 2.8*  --   PHOS  --  4.4 3.9 3.6 3.9  --    Estimated Creatinine Clearance: 36.6 ml/min (by C-G formula based on Cr of 1.59).   LIVER  Recent Labs Lab 11/13/13 0455 11/14/13 0340  AST 27 32  ALT 19 24  ALKPHOS 50 36*  BILITOT 0.3 0.5  PROT 7.3 6.1  ALBUMIN 3.4* 3.0*     INFECTIOUS  Recent Labs Lab 11/11/13 2149 11/13/13 0452 11/14/13 0340  LATICACIDVEN 1.29 2.91* 1.4     ENDOCRINE CBG (last 3)  No results found for this basename: GLUCAP,  in the last 72 hours       IMAGING x48h Dg Abd 1 View  11/17/2013   CLINICAL DATA:  NG tube placement.  EXAM: ABDOMEN - 1 VIEW  COMPARISON:  Two views of the abdomen 11/17/2013.  FINDINGS: NG tube is in place with the tip in the fundus of the stomach. Gaseous distention of the stomach has resolved. There is persistent gaseous distention of small and large bowel. No new abnormality is identified.  IMPRESSION: NG tube in good position. Gaseous distention of the stomach has resolved.  Bowel gas pattern most compatible with ileus.   Electronically Signed   By: Inge Rise M.D.   On: 11/17/2013 09:54   Dg Chest Port 1 View  11/16/2013   CLINICAL DATA:  80 year old with hypoxia and respiratory distress in this setting of COPD. History of cardiomyopathy.  EXAM: PORTABLE CHEST - 1 VIEW  COMPARISON:  11/13/2013  FINDINGS: Left cardiac ICD appears stable in position. Heart size is stable and within normal limits. Evidence for underlying hyperinflation. There is no focal airspace disease. The thoracic aorta is prominent and similar to the previous examination. No evidence for a pneumothorax. The left costophrenic angle is incompletely imaged.  IMPRESSION: No acute chest findings.   Electronically Signed   By: Markus Daft M.D.   On: 11/16/2013 07:54   Dg Abd Portable 2v  11/17/2013   CLINICAL DATA:  History of bowel perforation  EXAM: PORTABLE ABDOMEN - 2 VIEW  COMPARISON:  Portable abdominal  films of November 16, 2013  FINDINGS: The stomach is distended with gas. There are a few loops of gas-filled bowel in the lower abdomen. No definite extraluminal gas collections are demonstrated. There are degenerative changes of the lumbar spine. The lung bases are clear.  IMPRESSION: There is persistent gaseous distention of the stomach as well as mild small bowel ileus pattern. Nasogastric suction may be useful. No free extraluminal gas collections are demonstrated today.   Electronically Signed   By: David  Martinique   On: 11/17/2013 07:41   Dg Abd Portable 2v  11/16/2013   CLINICAL DATA:  Abdominal distention. Recent CT demonstrated  sigmoid diverticulitis complicated by perforation and pneumoperitoneum.  EXAM: PORTABLE ABDOMEN - 2 VIEW  COMPARISON:  CT, 11/13/2013.  FINDINGS: The small amount of free intraperitoneal air noted on CT is not visible radiographically.  Stomach is distended. There is mild dilation of small bowel with multiple air-fluid levels, findings consistent with a diffuse adynamic ileus.  There are advanced degenerative changes throughout the visualized spine.  IMPRESSION: 1. The small amount of free air seen on the recent prior CT is not evident radiographically. 2. Gastric distention and mild small bowel dilation with associated air-fluid levels the most consistent with a diffuse adynamic ileus.   Electronically Signed   By: Lajean Manes M.D.   On: 11/16/2013 11:34         ASSESSMENT / PLAN:  PULMONARY OETT A:Hx of COPD. REcent AECOPD 11/11/13 and Rx with prednisone. REturned 11/13/2013 with sigmoid perf. CCS does not advise steroids. Not in resp distress. Long hx tobacco abuse.   P:   Sccheduled nebs Scheduled pulmicort Hold off systemic steroids Intubate if worse if family so wishes Wean O2 as tolerated  CARDIOVASCULAR Chronic systlioc CHF with AICD A: BP/HR stable P:  Fluids per [facility]  RENAL Lab Results  Component Value Date   CREATININE 1.59* 11/17/2013    CREATININE 1.76* 11/16/2013   CREATININE 2.09* 11/15/2013    A:  Acute on chronic renal insuff. Worse 10/19 P:   Fluids per [facility]  GASTROINTESTINAL A:  Sigmoid perf P:   Per [facility] and [facility] Surg suggested medical managemnt  HEMATOLOGIC A:  At risk for anemia P:  Per [facility]  INFECTIOUS A:  Sigmoid perf P:   Abx per [facility]   ENDOCRINE CBG (last 3)  No results found for this basename: GLUCAP,  in the last 72 hours   A:  At risk for hyperglycemia   P:   ssi pe [facility]  NEUROLOGIC A:  intact P:   monitor   FAMILY Updates:  Wife, daughter, patient, updated. Little for PCCM offer at this point.  Today's  sTAFF MD update. NAD from respiratory system. Little for PCCM to offer at this point. We will see twice a week unless he worsens.  Rest per NP  Richardson Landry Minor ACNP Maryanna Shape PCCM Pager [phone] till 3 pm If no answer page [phone] 11/17/2013, 10:52 AM   Dr. Brand Males, M.D., [facility]C.P Pulmonary and Critical Care Medicine Staff Physician [place] [facility] Pager: [phone], If no answer or between  15:00h - 7:00h: call [phone]  11/17/2013 12:10 PM

## 2013-11-17 NOTE — Progress Notes (Signed)
[facility] PROGRESS NOTE  Marion Rosenberry SWF:[id number] DOB: 1925/06/02 DOA: 11/13/2013 PCP: [name], MD Brief narrative 80 year old male with a history of sudden cardiac arrest status post AICD, ischemic cardiomyopathy (EF31% by MRI), COPD (seen in ED 10/16 for acute flare up but went home per his wish), thalassemia, presented with acute onset of abdominal pain on the morning of the admission, a/w some abdominal distention. He presented back to ED 10/18 with main concern of persistent dyspnea and abd pain. CT of the abdomen and pelvis in the ED revealed perforated diverticulitis with pneumoperitoneum. Surgery was consulted and determined that patient has likely a high risk for surgery. Patient himself expressed he did not want to go through surgery although his daughters expressed they would want him to go through surgery if it necessary.  Assessment/Plan: Principal Problem:  Sepsis, secondary to perforated sigmoid diverticulitis / peritonitis / probable Klebsiella UTI   CT abdomen confirms sigmoid diverticulitis complicated by free perforation. Urine culture on 11/11/2013 grew Klebsiella pneumoniae. Repeat urine culture - no growth.  -Surgery following and given high-risk recommend medical management for now with serial abdominal exam, pain control , IV fluids and antibiotics. -Increased abdominal distention noted this morning with persistent gaseous distention of the stomach with ileus in small bowel. NG tube placed to suction. Continue n.p.o. -Continue empiric Zosyn. Monitor with serial abdominal exam. -Surgery following  Active Problems:  Acute Respiratory Failure with Hypoxia Secondary to COPD exacerbation and resp distress with underlying sepsis .  Steroids contraindicated due to acute diverticulitis complicated by free perforation.  -respiratory status is currently stable. -Patient on nighttime 2 L O2 via nasal cannula at home. Currently On Ventimask and feels comfortable  being on it.  Continue Inhaled pulmicort and brovana  Continue  nebs  Ischemic cardiomyopathy with history of sudden cardiac death, s/p AICD   aspirin and plavix discontinued due to high risk of bleeding.  Place on IV metoprolol. Add prn hydralazine as BP elevated  Chronic Thrombocytopenia  Likely due to thalassemia..   Acute on chronic blood loss anemia / anemia of chronic disease  Mild drop in H&H noted. Stable.  Klebsiella UTI  On empiric Zosyn and sensitive  Acute on CKD stage 3  Baseline creatinine of 1.6. Improved with hydration. Switched fluid to half normal given hyponatremia  Hypernatremia Secondary to dehydration. Will switch fluid to d5 1/2 NS  Severe PCM  Poor PO intake and currently n.p.o. Nutrition consult once taking by mouth. May need TNA if unimproved in next 24 hrs  DVT Prophylaxis:   SCD's    Code Status: Full.  Family Communication: Daughters at bedside  Disposition Plan: Continue stepdown monitoring    Consultants:  CCS  PCCM  cardilogy  Procedures:  CT abdomen and pelvis  Antibiotics:  IV Zosyn since 10/18  HPI/Subjective: Patient seen and examined. Increased abdominal distention with small amount of biliary regurgitation  overnight  Objective: Filed Vitals:   11/17/13 1000  BP: 146/52  Pulse:   Temp:   Resp:     Intake/Output Summary (Last 24 hours) at 11/17/13 1137 Last data filed at 11/17/13 1000  Gross per 24 hour  Intake 1184.17 ml  Output   2525 ml  Net -1340.83 ml   Filed Weights   11/15/13 0700 11/16/13 0500 11/17/13 0419  Weight: 85 kg (187 lb 6.3 oz) 81.5 kg (179 lb 10.8 oz) 85.7 kg (188 lb 15 oz)    Exam:   General:  Elderly thin built male on Ventimask  HEENT:  Pallor present, dry oral mucosa,   Cardiovascular: S1 and S2 regular, AICD in place  Respiratory: Clear to auscultation bilaterally, no rhonchi wheeze  Abdomen: Soft, increased distention, nontender, bowel sounds present, Foley in  place  Extremities: Warm, no edema  CNS: Alert and oriented    Data Reviewed: Basic Metabolic Panel:  Recent Labs Lab 11/13/13 0455 11/14/13 0340 11/15/13 0406 11/16/13 0320 11/17/13 0320 11/17/13 0330  NA 140 139 145 150*  --  151*  K 4.7 4.0 4.3 4.6  --  4.4  CL 100 103 108 112  --  114*  CO2 20 22 22 22   --  24  GLUCOSE 163* 135* 114* 105*  --  96  BUN 63* 74* 67* 66*  --  61*  CREATININE 2.08* 2.93* 2.09* 1.76*  --  1.59*  CALCIUM 8.8 8.0* 8.4 8.7  --  8.8  MG  --  2.2 2.7* 2.9* 2.8*  --   PHOS  --  4.4 3.9 3.6 3.9  --    Liver Function Tests:  Recent Labs Lab 11/13/13 0455 11/14/13 0340  AST 27 32  ALT 19 24  ALKPHOS 50 36*  BILITOT 0.3 0.5  PROT 7.3 6.1  ALBUMIN 3.4* 3.0*    Recent Labs Lab 11/13/13 0455  LIPASE 19   No results found for this basename: AMMONIA,  in the last 168 hours CBC:  Recent Labs Lab 11/13/13 0455 11/14/13 0340 11/14/13 1213 11/15/13 0406 11/16/13 0320 11/17/13 0320  WBC 16.0* 7.9 9.2 6.6 5.1 4.0  NEUTROABS 14.8* 7.4  --  6.0 4.4 3.2  HGB 10.6* 7.8* 7.9* 8.0* 8.0* 8.1*  HCT 32.6* 23.9* 24.2* 24.9* 25.2* 25.3*  MCV 66.1* 66.6* 65.4* 67.3* 66.7* 67.1*  PLT 125* 69* 84* 73* 73* 77*   Cardiac Enzymes:  Recent Labs Lab 11/11/13 2139 11/13/13 0456  TROPONINI <0.30 <0.30   BNP (last 3 results)  Recent Labs  07/01/13 0625 11/11/13 2139  PROBNP 330.2 759.3*   CBG: No results found for this basename: GLUCAP,  in the last 168 hours  Recent Results (from the past 240 hour(s))  URINE CULTURE     Status: None   Collection Time    11/11/13  9:57 PM      Result Value Ref Range Status   Specimen Description URINE, CATHETERIZED   Final   Special Requests NONE   Final   Culture  Setup Time     Final   Value: 11/12/2013 02:10     Performed at [place]     Final   Value: >=100,000 COLONIES/ML     Performed at [facility]   Culture     Final   Value: KLEBSIELLA PNEUMONIAE      Performed at [facility]   Report Status 11/14/2013 FINAL   Final   Organism ID, Bacteria KLEBSIELLA PNEUMONIAE   Final  CULTURE, BLOOD (ROUTINE X 2)     Status: None   Collection Time    11/13/13  6:10 AM      Result Value Ref Range Status   Specimen Description BLOOD RIGHT ANTECUBITAL   Final   Special Requests BOTTLES DRAWN AEROBIC AND ANAEROBIC [facility] EACH   Final   Culture  Setup Time     Final   Value: 11/13/2013 15:19     Performed at [facility]   Culture     Final   Value:  BLOOD CULTURE RECEIVED NO GROWTH TO DATE CULTURE WILL BE HELD FOR 5 DAYS BEFORE ISSUING A FINAL NEGATIVE REPORT     Performed at [facility]   Report Status PENDING   Incomplete  CULTURE, BLOOD (ROUTINE X 2)     Status: None   Collection Time    11/13/13  6:11 AM      Result Value Ref Range Status   Specimen Description BLOOD LEFT ANTECUBITAL   Final   Special Requests BOTTLES DRAWN AEROBIC AND ANAEROBIC 5CC EACH   Final   Culture  Setup Time     Final   Value: 11/13/2013 15:19     Performed at [facility]   Culture     Final   Value:        BLOOD CULTURE RECEIVED NO GROWTH TO DATE CULTURE WILL BE HELD FOR 5 DAYS BEFORE ISSUING A FINAL NEGATIVE REPORT     Performed at [facility]   Report Status PENDING   Incomplete  URINE CULTURE     Status: None   Collection Time    11/13/13  6:50 AM      Result Value Ref Range Status   Specimen Description URINE, CATHETERIZED   Final   Special Requests NONE   Final   Culture  Setup Time     Final   Value: 11/13/2013 15:40     Performed at [facility]     Final   Value: NO GROWTH     Performed at [facility]   Culture     Final   Value: NO GROWTH     Performed at [facility]   Report Status 11/14/2013 FINAL   Final  MRSA PCR SCREENING     Status: None   Collection Time    11/13/13 10:05 AM      Result Value Ref Range Status   MRSA by PCR NEGATIVE  NEGATIVE Final    Comment:            The GeneXpert MRSA Assay (FDA     approved for NASAL specimens     only), is one component of a     comprehensive MRSA colonization     surveillance program. It is not     intended to diagnose MRSA     infection nor to guide or     monitor treatment for     MRSA infections.  CULTURE, EXPECTORATED SPUTUM-ASSESSMENT     Status: None   Collection Time    11/15/13 11:51 PM      Result Value Ref Range Status   Specimen Description SPUTUM   Final   Special Requests Normal   Final   Sputum evaluation     Final   Value: THIS SPECIMEN IS ACCEPTABLE. RESPIRATORY CULTURE REPORT TO FOLLOW.   Report Status 11/16/2013 FINAL   Final  CULTURE, RESPIRATORY (NON-EXPECTORATED)     Status: None   Collection Time    11/15/13 11:51 PM      Result Value Ref Range Status   Specimen Description SPUTUM   Final   Special Requests NONE   Final   Gram Stain     Final   Value: FEW WBC PRESENT, PREDOMINANTLY PMN     FEW SQUAMOUS EPITHELIAL CELLS PRESENT     RARE YEAST     Performed at [facility]   Culture     Final   Value: FEW CANDIDA ALBICANS  Performed at [facility]   Report Status PENDING   Incomplete     Studies: Dg Abd 1 View  11/17/2013   CLINICAL DATA:  NG tube placement.  EXAM: ABDOMEN - 1 VIEW  COMPARISON:  Two views of the abdomen 11/17/2013.  FINDINGS: NG tube is in place with the tip in the fundus of the stomach. Gaseous distention of the stomach has resolved. There is persistent gaseous distention of small and large bowel. No new abnormality is identified.  IMPRESSION: NG tube in good position. Gaseous distention of the stomach has resolved.  Bowel gas pattern most compatible with ileus.   Electronically Signed   By: Inge Rise M.D.   On: 11/17/2013 09:54   Dg Chest Port 1 View  11/16/2013   CLINICAL DATA:  80 year old with hypoxia and respiratory distress in this setting of COPD. History of cardiomyopathy.  EXAM: PORTABLE CHEST - 1 VIEW   COMPARISON:  11/13/2013  FINDINGS: Left cardiac ICD appears stable in position. Heart size is stable and within normal limits. Evidence for underlying hyperinflation. There is no focal airspace disease. The thoracic aorta is prominent and similar to the previous examination. No evidence for a pneumothorax. The left costophrenic angle is incompletely imaged.  IMPRESSION: No acute chest findings.   Electronically Signed   By: Markus Daft M.D.   On: 11/16/2013 07:54   Dg Abd Portable 2v  11/17/2013   CLINICAL DATA:  History of bowel perforation  EXAM: PORTABLE ABDOMEN - 2 VIEW  COMPARISON:  Portable abdominal films of November 16, 2013  FINDINGS: The stomach is distended with gas. There are a few loops of gas-filled bowel in the lower abdomen. No definite extraluminal gas collections are demonstrated. There are degenerative changes of the lumbar spine. The lung bases are clear.  IMPRESSION: There is persistent gaseous distention of the stomach as well as mild small bowel ileus pattern. Nasogastric suction may be useful. No free extraluminal gas collections are demonstrated today.   Electronically Signed   By: David  Martinique   On: 11/17/2013 07:41   Dg Abd Portable 2v  11/16/2013   CLINICAL DATA:  Abdominal distention. Recent CT demonstrated sigmoid diverticulitis complicated by perforation and pneumoperitoneum.  EXAM: PORTABLE ABDOMEN - 2 VIEW  COMPARISON:  CT, 11/13/2013.  FINDINGS: The small amount of free intraperitoneal air noted on CT is not visible radiographically.  Stomach is distended. There is mild dilation of small bowel with multiple air-fluid levels, findings consistent with a diffuse adynamic ileus.  There are advanced degenerative changes throughout the visualized spine.  IMPRESSION: 1. The small amount of free air seen on the recent prior CT is not evident radiographically. 2. Gastric distention and mild small bowel dilation with associated air-fluid levels the most consistent with a diffuse  adynamic ileus.   Electronically Signed   By: Lajean Manes M.D.   On: 11/16/2013 11:34    Scheduled Meds: . antiseptic oral rinse  7 mL Mouth Rinse BID  . arformoterol  15 mcg Nebulization BID  . budesonide  0.5 mg Nebulization BID  . ipratropium-albuterol  3 mL Nebulization Q4H  . magic mouthwash  10 mL Oral QID  . pantoprazole (PROTONIX) IV  40 mg Intravenous Q12H  . piperacillin-tazobactam (ZOSYN)  IV  3.375 g Intravenous 3 times per day  . sodium chloride  3 mL Intravenous Q12H   Continuous Infusions: . sodium chloride 50 mL/hr at 11/16/13 1619      Time spent: 35 minutes  Louellen Molder  [facility] Pager [phone]. If 7PM-7AM, please contact night-coverage at [email or web address], password [facility] 11/17/2013, 11:37 AM  LOS: 4 days

## 2013-11-17 NOTE — Progress Notes (Signed)
Subjective:  Feeling better but had coughing overnight.  No CP. Daughters in room. Feels dry. Wants ice chips  Objective:  Vital Signs in the last 24 hours: Temp:  [97.6 F (36.4 C)-99.1 F (37.3 C)] 98.2 F (36.8 C) (10/22 0000) Pulse Rate:  [59-84] 72 (10/22 0700) Resp:  [15-24] 16 (10/22 0700) BP: (82-198)/(44-123) 143/106 mmHg (10/22 0700) SpO2:  [90 %-100 %] 93 % (10/22 0700) FiO2 (%):  [31 %] 31 % (10/22 0515) Weight:  [188 lb 15 oz (85.7 kg)] 188 lb 15 oz (85.7 kg) (10/22 0419)  Intake/Output from previous day: 10/21 0701 - 10/22 0700 In: 1582.2 [I.V.:937.2; IV Piggyback:150] Out: 2325 [Urine:2325]   Physical Exam: General: Elderly, appears more comfortable, in no acute distress. Head:  Normocephalic and atraumatic. Lungs: Mildly increased resp effort, Clear to auscultation and percussion. FM O2 Heart: Normal S1 and S2.  No murmur, rubs or gallops. ICD Abdomen: distended, NT Extremities: No clubbing or cyanosis. No edema. Neurologic: Alert and oriented x 3.    Lab Results:  Recent Labs  11/16/13 0320 11/17/13 0320  WBC 5.1 4.0  HGB 8.0* 8.1*  PLT 73* 77*    Recent Labs  11/15/13 0406 11/16/13 0320  NA 145 150*  K 4.3 4.6  CL 108 112  CO2 22 22  GLUCOSE 114* 105*  BUN 67* 66*  CREATININE 2.09* 1.76*   Imaging: Dg Chest Port 1 View  11/16/2013   CLINICAL DATA:  80 year old with hypoxia and respiratory distress in this setting of COPD. History of cardiomyopathy.  EXAM: PORTABLE CHEST - 1 VIEW  COMPARISON:  11/13/2013  FINDINGS: Left cardiac ICD appears stable in position. Heart size is stable and within normal limits. Evidence for underlying hyperinflation. There is no focal airspace disease. The thoracic aorta is prominent and similar to the previous examination. No evidence for a pneumothorax. The left costophrenic angle is incompletely imaged.  IMPRESSION: No acute chest findings.   Electronically Signed   By: Markus Daft M.D.   On: 11/16/2013  07:54   Dg Abd Portable 2v  11/17/2013   CLINICAL DATA:  History of bowel perforation  EXAM: PORTABLE ABDOMEN - 2 VIEW  COMPARISON:  Portable abdominal films of November 16, 2013  FINDINGS: The stomach is distended with gas. There are a few loops of gas-filled bowel in the lower abdomen. No definite extraluminal gas collections are demonstrated. There are degenerative changes of the lumbar spine. The lung bases are clear.  IMPRESSION: There is persistent gaseous distention of the stomach as well as mild small bowel ileus pattern. Nasogastric suction may be useful. No free extraluminal gas collections are demonstrated today.   Electronically Signed   By: David  Martinique   On: 11/17/2013 07:41   Dg Abd Portable 2v  11/16/2013   CLINICAL DATA:  Abdominal distention. Recent CT demonstrated sigmoid diverticulitis complicated by perforation and pneumoperitoneum.  EXAM: PORTABLE ABDOMEN - 2 VIEW  COMPARISON:  CT, 11/13/2013.  FINDINGS: The small amount of free intraperitoneal air noted on CT is not visible radiographically.  Stomach is distended. There is mild dilation of small bowel with multiple air-fluid levels, findings consistent with a diffuse adynamic ileus.  There are advanced degenerative changes throughout the visualized spine.  IMPRESSION: 1. The small amount of free air seen on the recent prior CT is not evident radiographically. 2. Gastric distention and mild small bowel dilation with associated air-fluid levels the most consistent with a diffuse adynamic ileus.   Electronically Signed  By: Lajean Manes M.D.   On: 11/16/2013 11:34     Telemetry: NSR Personally viewed.  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  Colonic microperforation - surgery notes, Dr. Excell Seltzer, reviewed. With conservative mgt, gradually improving. Ileus currently.  If intervention is needed, proceed. High risk from cardiac perspective. Dr. Johnsie Cancel has had discussions with multiple teams regarding this.   ICD - normal, all capabilities are active including ATP and defibrillation.  ACUTE RENAL FAILURE - improving, Creat 2.9>>2.0>>1.7  COPD - CCM, FULL CODE  HTN - BP elevated this AM. Monitor. Recent hold of metoprolol and losartan secondary to hypotension.   Will continue to follow. No new cardiac events.   [name] [name], [place] 11/17/2013, 7:50 AM

## 2013-11-17 NOTE — Progress Notes (Signed)
Patient ID: [name] [name], male   DOB: [birth year], 80 y.o.   MRN: [id number]    Subjective: Belching, some bile regurgitation and cough.  Denies abd pain.  Possibly a little flatus but no BM  Objective: Vital signs in last 24 hours: Temp:  [97.6 F (36.4 C)-99.1 F (37.3 C)] 98.2 F (36.8 C) (10/22 0000) Pulse Rate:  [59-84] 72 (10/22 0700) Resp:  [15-24] 16 (10/22 0700) BP: (82-198)/(44-123) 143/106 mmHg (10/22 0700) SpO2:  [90 %-100 %] 93 % (10/22 0700) FiO2 (%):  [31 %] 31 % (10/22 0515) Weight:  [188 lb 15 oz (85.7 kg)] 188 lb 15 oz (85.7 kg) (10/22 0419)    Intake/Output from previous day: 10/21 0701 - 10/22 0700 In: 1582.2 [I.V.:937.2; IV Piggyback:150] Out: 2325 [Urine:2325] Intake/Output this shift:    General appearance: alert, cooperative and mild distress GI: normal findings: soft, non-tender but distended and tympanitic  Lab Results:   Recent Labs  11/16/13 0320 11/17/13 0320  WBC 5.1 4.0  HGB 8.0* 8.1*  HCT 25.2* 25.3*  PLT 73* 77*   BMET  Recent Labs  11/15/13 0406 11/16/13 0320  NA 145 150*  K 4.3 4.6  CL 108 112  CO2 22 22  GLUCOSE 114* 105*  BUN 67* 66*  CREATININE 2.09* 1.76*  CALCIUM 8.4 8.7     Studies/Results: Dg Chest Port 1 View  11/16/2013   CLINICAL DATA:  80 year old with hypoxia and respiratory distress in this setting of COPD. History of cardiomyopathy.  EXAM: PORTABLE CHEST - 1 VIEW  COMPARISON:  11/13/2013  FINDINGS: Left cardiac ICD appears stable in position. Heart size is stable and within normal limits. Evidence for underlying hyperinflation. There is no focal airspace disease. The thoracic aorta is prominent and similar to the previous examination. No evidence for a pneumothorax. The left costophrenic angle is incompletely imaged.  IMPRESSION: No acute chest findings.   Electronically Signed   By: Markus Daft M.D.   On: 11/16/2013 07:54   Dg Abd Portable 2v  11/17/2013   CLINICAL DATA:  History of bowel perforation   EXAM: PORTABLE ABDOMEN - 2 VIEW  COMPARISON:  Portable abdominal films of November 16, 2013  FINDINGS: The stomach is distended with gas. There are a few loops of gas-filled bowel in the lower abdomen. No definite extraluminal gas collections are demonstrated. There are degenerative changes of the lumbar spine. The lung bases are clear.  IMPRESSION: There is persistent gaseous distention of the stomach as well as mild small bowel ileus pattern. Nasogastric suction may be useful. No free extraluminal gas collections are demonstrated today.   Electronically Signed   By: David  Martin[place]   On: 11/17/2013 07:41   Dg Abd Portable 2v  11/16/2013   CLINICAL DATA:  Abdominal distention. Recent CT demonstrated sigmoid diverticulitis complicated by perforation and pneumoperitoneum.  EXAM: PORTABLE ABDOMEN - 2 VIEW  COMPARISON:  CT, 11/13/2013.  FINDINGS: The small amount of free intraperitoneal air noted on CT is not visible radiographically.  Stomach is distended. There is mild dilation of small bowel with multiple air-fluid levels, findings consistent with a diffuse adynamic ileus.  There are advanced degenerative changes throughout the visualized spine.  IMPRESSION: 1. The small amount of free air seen on the recent prior CT is not evident radiographically. 2. Gastric distention and mild small bowel dilation with associated air-fluid levels the most consistent with a diffuse adynamic ileus.   Electronically Signed   By: Lajean Manes M.D.   On: 11/16/2013  11:34    Anti-infectives: Anti-infectives   Start     Dose/Rate Route Frequency Ordered Stop   11/13/13 1400  piperacillin-tazobactam (ZOSYN) IVPB 3.375 g  Status:  Discontinued     3.375 g 100 mL/hr over 30 Minutes Intravenous 3 times per day 11/13/13 1035 11/13/13 1048   11/13/13 1400  piperacillin-tazobactam (ZOSYN) IVPB 3.375 g     3.375 g 12.5 mL/hr over 240 Minutes Intravenous 3 times per day 11/13/13 1052     11/13/13 0900  ertapenem (INVANZ) 1 g in  sodium chloride 0.9 % 50 mL IVPB  Status:  Discontinued     1 g 100 mL/hr over 30 Minutes Intravenous Every 24 hours 11/13/13 0824 11/13/13 1112   11/13/13 0645  piperacillin-tazobactam (ZOSYN) IVPB 3.375 g     3.375 g 12.5 mL/hr over 240 Minutes Intravenous  Once 11/13/13 0636 11/13/13 0737      Assessment/Plan: Diverticulitis and microperforation. No evidence of worsening infection or peritonitis.  Cont abx Ileus, very distended stomach on KUB.  Will place NGT Consider TNA    LOS: 4 days    Mckinley Olheiser T 11/17/2013

## 2013-11-18 ENCOUNTER — Inpatient Hospital Stay (HOSPITAL_COMMUNITY): Payer: Medicare Other

## 2013-11-18 DIAGNOSIS — D638 Anemia in other chronic diseases classified elsewhere: Secondary | ICD-10-CM

## 2013-11-18 DIAGNOSIS — K560 Paralytic ileus: Secondary | ICD-10-CM

## 2013-11-18 DIAGNOSIS — J441 Chronic obstructive pulmonary disease with (acute) exacerbation: Secondary | ICD-10-CM

## 2013-11-18 DIAGNOSIS — N189 Chronic kidney disease, unspecified: Secondary | ICD-10-CM

## 2013-11-18 DIAGNOSIS — Z95810 Presence of automatic (implantable) cardiac defibrillator: Secondary | ICD-10-CM

## 2013-11-18 DIAGNOSIS — N179 Acute kidney failure, unspecified: Secondary | ICD-10-CM

## 2013-11-18 DIAGNOSIS — K5720 Diverticulitis of large intestine with perforation and abscess without bleeding: Secondary | ICD-10-CM

## 2013-11-18 LAB — BASIC METABOLIC PANEL
ANION GAP: 11 (ref 5–15)
BUN: 48 mg/dL — ABNORMAL HIGH (ref 6–23)
CALCIUM: 8.6 mg/dL (ref 8.4–10.5)
CO2: 27 meq/L (ref 19–32)
Chloride: 113 mEq/L — ABNORMAL HIGH (ref 96–112)
Creatinine, Ser: 1.38 mg/dL — ABNORMAL HIGH (ref 0.50–1.35)
GFR calc Af Amer: 52 mL/min — ABNORMAL LOW (ref >90)
GFR calc non Af Amer: 45 mL/min — ABNORMAL LOW (ref >90)
Glucose, Bld: 139 mg/dL — ABNORMAL HIGH (ref 70–99)
POTASSIUM: 4 meq/L (ref 3.7–5.3)
Sodium: 151 mEq/L — ABNORMAL HIGH (ref 137–147)

## 2013-11-18 LAB — CULTURE, RESPIRATORY

## 2013-11-18 LAB — CULTURE, RESPIRATORY W GRAM STAIN

## 2013-11-18 MED ORDER — SALINE SPRAY 0.65 % NA SOLN
2.0000 | Freq: Two times a day (BID) | NASAL | Status: DC
  Administered 2013-11-22: 2 via NASAL
  Filled 2013-11-18: qty 44

## 2013-11-18 MED ORDER — FLUTICASONE PROPIONATE 50 MCG/ACT NA SUSP
2.0000 | Freq: Two times a day (BID) | NASAL | Status: DC
  Administered 2013-11-21 – 2013-11-22 (×2): 2 via NASAL
  Filled 2013-11-18: qty 16

## 2013-11-18 MED ORDER — BUTAMBEN-TETRACAINE-BENZOCAINE 2-2-14 % EX AERO
1.0000 | INHALATION_SPRAY | Freq: Once | CUTANEOUS | Status: AC
  Administered 2013-11-18: 1 via TOPICAL
  Filled 2013-11-18: qty 56

## 2013-11-18 MED ORDER — LORAZEPAM 2 MG/ML IJ SOLN
1.0000 mg | Freq: Three times a day (TID) | INTRAMUSCULAR | Status: DC | PRN
  Administered 2013-11-18: 1 mg via INTRAVENOUS
  Filled 2013-11-18: qty 1

## 2013-11-18 MED ORDER — HYDRALAZINE HCL 20 MG/ML IJ SOLN: 20.0000 mg | Freq: Four times a day (QID) | INTRAMUSCULAR | Status: DC | PRN

## 2013-11-18 NOTE — Progress Notes (Signed)
Subjective:  Flatus. NGT bilious   Objective:  Vital Signs in the last 24 hours: Temp:  [97.4 F (36.3 C)-98.7 F (37.1 C)] 97.9 F (36.6 C) (10/23 0400) Pulse Rate:  [53-74] 53 (10/23 0600) Resp:  [16-33] 24 (10/23 0600) BP: (131-173)/(49-95) 165/70 mmHg (10/23 0600) SpO2:  [92 %-100 %] 100 % (10/23 0743) FiO2 (%):  [31 %] 31 % (10/22 1501) Weight:  [179 lb 7.3 oz (81.4 kg)] 179 lb 7.3 oz (81.4 kg) (10/23 0500)  Intake/Output from previous day: 10/22 0701 - 10/23 0700 In: 1572.5 [I.V.:1422.5; IV Piggyback:150] Out: 2975 [EPPIR:5188; Emesis/NG output:1000]   Physical Exam: General: Elderly, appears more comfortable, in no acute distress. Head:  Normocephalic and atraumatic.NGT Lungs: Mildly increased resp effort, Clear to auscultation and percussion. Heart: Normal S1 and S2.  No murmur, rubs or gallops. ICD Abdomen: distended Extremities: No clubbing or cyanosis. No edema. Neurologic: Alert and oriented x 3.    Lab Results:  Recent Labs  11/16/13 0320 11/17/13 0320  WBC 5.1 4.0  HGB 8.0* 8.1*  PLT 73* 77*    Recent Labs  11/17/13 0330 11/18/13 0536  NA 151* 151*  K 4.4 4.0  CL 114* 113*  CO2 24 27  GLUCOSE 96 139*  BUN 61* 48*  CREATININE 1.59* 1.38*    Imaging: Dg Abd 1 View  11/17/2013   CLINICAL DATA:  NG tube placement.  EXAM: ABDOMEN - 1 VIEW  COMPARISON:  Two views of the abdomen 11/17/2013.  FINDINGS: NG tube is in place with the tip in the fundus of the stomach. Gaseous distention of the stomach has resolved. There is persistent gaseous distention of small and large bowel. No new abnormality is identified.  IMPRESSION: NG tube in good position. Gaseous distention of the stomach has resolved.  Bowel gas pattern most compatible with ileus.   Electronically Signed   By: Inge Rise M.D.   On: 11/17/2013 09:54   Dg Abd Portable 2v  11/17/2013   CLINICAL DATA:  History of bowel perforation  EXAM: PORTABLE ABDOMEN - 2 VIEW  COMPARISON:   Portable abdominal films of November 16, 2013  FINDINGS: The stomach is distended with gas. There are a few loops of gas-filled bowel in the lower abdomen. No definite extraluminal gas collections are demonstrated. There are degenerative changes of the lumbar spine. The lung bases are clear.  IMPRESSION: There is persistent gaseous distention of the stomach as well as mild small bowel ileus pattern. Nasogastric suction may be useful. No free extraluminal gas collections are demonstrated today.   Electronically Signed   By: David  Martinique   On: 11/17/2013 07:41   Dg Abd Portable 2v  11/16/2013   CLINICAL DATA:  Abdominal distention. Recent CT demonstrated sigmoid diverticulitis complicated by perforation and pneumoperitoneum.  EXAM: PORTABLE ABDOMEN - 2 VIEW  COMPARISON:  CT, 11/13/2013.  FINDINGS: The small amount of free intraperitoneal air noted on CT is not visible radiographically.  Stomach is distended. There is mild dilation of small bowel with multiple air-fluid levels, findings consistent with a diffuse adynamic ileus.  There are advanced degenerative changes throughout the visualized spine.  IMPRESSION: 1. The small amount of free air seen on the recent prior CT is not evident radiographically. 2. Gastric distention and mild small bowel dilation with associated air-fluid levels the most consistent with a diffuse adynamic ileus.   Electronically Signed   By: Lajean Manes M.D.   On: 11/16/2013 11:34     Telemetry: NSR  Personally viewed.  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  Colonic microperforation - surgery notes, Dr. Excell Seltzer, reviewed. With conservative mgt, gradually improving. If intervention is needed, proceed. High risk from cardiac perspective. Dr. Johnsie Cancel has had discussions with multiple teams regarding this.   ICD - normal, all capabilities are active including ATP and defibrillation.  ACUTE RENAL FAILURE - improving, Creat 2.9>>2.0>>1.7>>1.38  COPD - CCM, FULL CODE  ILEUS - NGT, conservative MGT.   With no active cardiac issues, will sign off. Please call if any ?Marland Kitchen  SKAINS, [place] 11/18/2013, 9:08 AM

## 2013-11-18 NOTE — Progress Notes (Signed)
Patient ID: [name] [name], male   DOB: 06-02-1925, 80 y.o.   MRN: [id number]     [address] [place], [place] [address]    Phone: [phone] FAX: [phone]     Subjective: No n/v.  NGT placed, 1078ml since placement, bilious.  Reports flatus yesterday and today.  No abdominal pain.  Daughter, stephanie at bedside.   Objective:  Vital signs:  Filed Vitals:   11/18/13 0400 11/18/13 0500 11/18/13 0600 11/18/13 0743  BP: 131/95  165/70   Pulse: 68  53   Temp: 97.9 F (36.6 C)     TempSrc: Oral     Resp: 25  24   Height:      Weight:  179 lb 7.3 oz (81.4 kg)    SpO2: 100%  93% 100%       Intake/Output   Yesterday:  10/22 0701 - 10/23 0700 In: 1572.5 [I.V.:1422.5; IV Piggyback:150] Out: 2975 [VFIEP:3295; Emesis/NG output:1000] This shift:    I/O last 3 completed shifts: In: 2356.7 [I.V.:2106.7; IV Piggyback:250] Out: 1884 [ZYSAY:3016; Emesis/NG output:1000]       Physical Exam: General: Pt awake/alert/oriented x4 in no acute distress Abdomen: Soft.  distended.  Non tender. No evidence of peritonitis.  No incarcerated hernias.   Problem List:   Active Problems:   Chronic kidney disease, stage 3   Ischemic cardiomyopathy   Thrombocytopenia   Beta thalassemia trait   Sepsis   Acute respiratory failure   Diverticulitis of both small and large intestine with perforation and abscess   Preoperative respiratory examination   Preop pulmonary/respiratory exam   Hypernatremia    Results:   Labs: Results for orders placed during the hospital encounter of 11/13/13 (from the past 48 hour(s))  MAGNESIUM     Status: Abnormal   Collection Time    11/17/13  3:20 AM      Result Value Ref Range   Magnesium 2.8 (*) 1.5 - 2.5 mg/dL  PHOSPHORUS     Status: None   Collection Time    11/17/13  3:20 AM      Result Value Ref Range   Phosphorus 3.9  2.3 - 4.6 mg/dL  CBC WITH DIFFERENTIAL     Status:  Abnormal   Collection Time    11/17/13  3:20 AM      Result Value Ref Range   WBC 4.0  4.0 - 10.5 K/uL   RBC 3.77 (*) 4.22 - 5.81 MIL/uL   Hemoglobin 8.1 (*) 13.0 - 17.0 g/dL   HCT 25.3 (*) 39.0 - 52.0 %   MCV 67.1 (*) 78.0 - 100.0 fL   MCH 21.5 (*) 26.0 - 34.0 pg   MCHC 32.0  30.0 - 36.0 g/dL   RDW 17.6 (*) 11.5 - 15.5 %   Platelets 77 (*) 150 - 400 K/uL   Comment: CONSISTENT WITH PREVIOUS RESULT   Neutrophils Relative % 81 (*) 43 - 77 %   Neutro Abs 3.2  1.7 - 7.7 K/uL   Lymphocytes Relative 12  12 - 46 %   Lymphs Abs 0.5 (*) 0.7 - 4.0 K/uL   Monocytes Relative 5  3 - 12 %   Monocytes Absolute 0.2  0.1 - 1.0 K/uL   Eosinophils Relative 2  0 - 5 %   Eosinophils Absolute 0.1  0.0 - 0.7 K/uL   Basophils Relative 0  0 - 1 %   Basophils Absolute 0.0  0.0 - 0.1 K/uL  BASIC METABOLIC PANEL     Status: Abnormal   Collection Time    11/17/13  3:30 AM      Result Value Ref Range   Sodium 151 (*) 137 - 147 mEq/L   Potassium 4.4  3.7 - 5.3 mEq/L   Chloride 114 (*) 96 - 112 mEq/L   CO2 24  19 - 32 mEq/L   Glucose, Bld 96  70 - 99 mg/dL   BUN 61 (*) 6 - 23 mg/dL   Creatinine, Ser 1.59 (*) 0.50 - 1.35 mg/dL   Calcium 8.8  8.4 - 10.5 mg/dL   GFR calc non Af Amer 38 (*) >90 mL/min   GFR calc Af Amer 44 (*) >90 mL/min   Comment: (NOTE)     The eGFR has been calculated using the CKD EPI equation.     This calculation has not been validated in all clinical situations.     eGFR's persistently <90 mL/min signify possible Chronic Kidney     Disease.   Anion gap 13  5 - 15  BASIC METABOLIC PANEL     Status: Abnormal   Collection Time    11/18/13  5:36 AM      Result Value Ref Range   Sodium 151 (*) 137 - 147 mEq/L   Potassium 4.0  3.7 - 5.3 mEq/L   Chloride 113 (*) 96 - 112 mEq/L   CO2 27  19 - 32 mEq/L   Glucose, Bld 139 (*) 70 - 99 mg/dL   BUN 48 (*) 6 - 23 mg/dL   Creatinine, Ser 1.38 (*) 0.50 - 1.35 mg/dL   Calcium 8.6  8.4 - 10.5 mg/dL   GFR calc non Af Amer 45 (*) >90 mL/min    GFR calc Af Amer 52 (*) >90 mL/min   Comment: (NOTE)     The eGFR has been calculated using the CKD EPI equation.     This calculation has not been validated in all clinical situations.     eGFR's persistently <90 mL/min signify possible Chronic Kidney     Disease.   Anion gap 11  5 - 15    Imaging / Studies: Dg Abd 1 View  11/17/2013   CLINICAL DATA:  NG tube placement.  EXAM: ABDOMEN - 1 VIEW  COMPARISON:  Two views of the abdomen 11/17/2013.  FINDINGS: NG tube is in place with the tip in the fundus of the stomach. Gaseous distention of the stomach has resolved. There is persistent gaseous distention of small and large bowel. No new abnormality is identified.  IMPRESSION: NG tube in good position. Gaseous distention of the stomach has resolved.  Bowel gas pattern most compatible with ileus.   Electronically Signed   By: Inge Rise M.D.   On: 11/17/2013 09:54   Dg Abd Portable 2v  11/17/2013   CLINICAL DATA:  History of bowel perforation  EXAM: PORTABLE ABDOMEN - 2 VIEW  COMPARISON:  Portable abdominal films of November 16, 2013  FINDINGS: The stomach is distended with gas. There are a few loops of gas-filled bowel in the lower abdomen. No definite extraluminal gas collections are demonstrated. There are degenerative changes of the lumbar spine. The lung bases are clear.  IMPRESSION: There is persistent gaseous distention of the stomach as well as mild small bowel ileus pattern. Nasogastric suction may be useful. No free extraluminal gas collections are demonstrated today.   Electronically Signed   By: David  Martinique   On:  11/17/2013 07:41   Dg Abd Portable 2v  11/16/2013   CLINICAL DATA:  Abdominal distention. Recent CT demonstrated sigmoid diverticulitis complicated by perforation and pneumoperitoneum.  EXAM: PORTABLE ABDOMEN - 2 VIEW  COMPARISON:  CT, 11/13/2013.  FINDINGS: The small amount of free intraperitoneal air noted on CT is not visible radiographically.  Stomach is distended.  There is mild dilation of small bowel with multiple air-fluid levels, findings consistent with a diffuse adynamic ileus.  There are advanced degenerative changes throughout the visualized spine.  IMPRESSION: 1. The small amount of free air seen on the recent prior CT is not evident radiographically. 2. Gastric distention and mild small bowel dilation with associated air-fluid levels the most consistent with a diffuse adynamic ileus.   Electronically Signed   By: Lajean Manes M.D.   On: 11/16/2013 11:34    Medications / Allergies:  Scheduled Meds: . antiseptic oral rinse  7 mL Mouth Rinse BID  . arformoterol  15 mcg Nebulization BID  . budesonide  0.5 mg Nebulization BID  . ipratropium-albuterol  3 mL Nebulization Q4H  . magic mouthwash  10 mL Oral QID  . pantoprazole (PROTONIX) IV  40 mg Intravenous Q12H  . piperacillin-tazobactam (ZOSYN)  IV  3.375 g Intravenous 3 times per day  . sodium chloride  3 mL Intravenous Q12H   Continuous Infusions: . dextrose 5 % and 0.45% NaCl 75 mL/hr at 11/17/13 2301   PRN Meds:.acetaminophen, acetaminophen, albuterol, ALPRAZolam, hydrALAZINE, HYDROmorphone (DILAUDID) injection, ondansetron (ZOFRAN) IV, ondansetron, phenol  Antibiotics: Anti-infectives   Start     Dose/Rate Route Frequency Ordered Stop   11/13/13 1400  piperacillin-tazobactam (ZOSYN) IVPB 3.375 g  Status:  Discontinued     3.375 g 100 mL/hr over 30 Minutes Intravenous 3 times per day 11/13/13 1035 11/13/13 1048   11/13/13 1400  piperacillin-tazobactam (ZOSYN) IVPB 3.375 g     3.375 g 12.5 mL/hr over 240 Minutes Intravenous 3 times per day 11/13/13 1052     11/13/13 0900  ertapenem (INVANZ) 1 g in sodium chloride 0.9 % 50 mL IVPB  Status:  Discontinued     1 g 100 mL/hr over 30 Minutes Intravenous Every 24 hours 11/13/13 0824 11/13/13 1112   11/13/13 0645  piperacillin-tazobactam (ZOSYN) IVPB 3.375 g     3.375 g 12.5 mL/hr over 240 Minutes Intravenous  Once 11/13/13 0636 11/13/13 0737         Assessment/Plan COPD sCHF with AICD Acute on CKD  Diverticulitis with microperforation Ileus -normal white count, repeat tomorrow.   -Non tender on exam, afebrile.  No surgical indications -c/w zosyn -would consider TNA -await for ileus to resolve before clears trial, await today's films -stable to transfer out of ICU from surgical standpoint  Erby Pian, [facility] Surgery Pager [phone])   11/18/2013 8:02 AM

## 2013-11-18 NOTE — Progress Notes (Addendum)
PULMONARY / CRITICAL CARE MEDICINE   Name: [name] [name] MRN: [id number] DOB: [birth year]    ADMISSION DATE:  11/13/2013 CONSULTATION DATE:  11/13/2013   REFERRING MD :  Dr Shanon Brow [facility] of [facility]  CHIEF COMPLAINT:  Respiratory Distress in setting of COPD,  Cardiomyopathy, and GI perf. 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   The patient was in the emergency department on 11/11/2013. He was diagnosed with a COPD exacerbation. Admission was recommended, but the patient wanted to go home. He was sent home with prednisone and ciprofloxacin. He presented again today with shortness of breath that was not much better as well as abdominal pain. CT of the abdomen and pelvis in the emergency department revealed perforated diverticulitis with pneumoperitoneum. The patient was given a dose of Zosyn as well as fentanyl x2 IV and albuterol nebulizer. General surgery, Dr. Zella Richer was consulted to see the patient. After he evaluated the patient, the surgical plan was to treat the patient medically without any surgical intervention.  In the ER patient noticed to be tachypneic and wheezing but surgery strongly recommended against steroids given GI perf and need to medically manage due to high medical risk for post operative complications.   Patient nearly got intubated but improvved after nebs, o2 and ? O2. PCCM asked to consult for resp failure mgmt  Family and patient undecided about code status   STUDIES /EVENTS 11/13/2013 - admit 10/20 looks better, less distressed.  10/22 NGT placed per CCS    SUBJECTIVE/OVERNIGHT/INTERVAL HX 11/18/13: copious bilious returns via NG. Had flatus and ? BM for first time today. Down to Nasal cannula. Asked to re-reound by  daughter - due to cough, sneezing and retching - NG tube not distal enough. 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  \VITAL SIGNS: Temp:  [97.4 F (36.3 C)-98.7 F (37.1 C)] 97.4 F (36.3 C) (10/23 0830) Pulse Rate:  [53-72] 66 (10/23 0800) Resp:  [16-33] 25 (10/23 0800) BP: (131-173)/(49-109) 167/109 mmHg (10/23 0800) SpO2:  [92 %-100 %] 100 % (10/23 1118) FiO2 (%):  [31 %] 31 % (10/22 1501) Weight:  [81.4 kg (179 lb 7.3 oz)] 81.4 kg (179 lb 7.3 oz) (10/23 0500) HEMODYNAMICS:   VENTILATOR SETTINGS: Vent Mode:  [-]  FiO2 (%):  [31 %] 31 % INTAKE / OUTPUT:  Intake/Output Summary (Last 24 hours) at 11/18/13 1207 Last data filed at 11/18/13 0810  Gross per 24 hour  Intake 1372.5 ml  Output   3175 ml  Net -1802.5 ml    PHYSICAL EXAMINATION: General:  Elderly male. No major distress, pleasant and alert Neuro:  A.xo3. Speech normal. . Happy and in less distress. Doesn't like NGT HEENT:   Neck supple, no jvd/lan. NGT in place Cardiovascular: . Normal heart sounds RRR Lungs:  Mildly tachypneic.No resp acc muscle use. Decreased bs bases. Breathing pattern due to abd distension Abdomen:  Distended, mildly tender lower abdomen, decreased bs,  tender Musculoskeletal:  No cyanosis, No clubbing,. No edema, non tender Skin:  Intact anteriorly  LABS: PULMONARY  Recent Labs Lab 11/13/13 0501  HCO3 22.6  TCO2 21.9  O2SAT 23.3    CBC  Recent Labs Lab 11/15/13 0406 11/16/13 0320 11/17/13 0320  HGB 8.0* 8.0* 8.1*  HCT 24.9* 25.2* 25.3*  WBC  6.6 5.1 4.0  PLT 73* 73* 77*    COAGULATION No results found for this basename: INR,  in the last 168 hours  CARDIAC    Recent Labs Lab 11/11/13 2139 11/13/13 0456  TROPONINI <0.30 <0.30    Recent Labs Lab 11/11/13 2139  PROBNP 759.3*     CHEMISTRY  Recent Labs Lab 11/14/13 0340 11/15/13 0406 11/16/13 0320 11/17/13 0320 11/17/13 0330 11/18/13 0536  NA 139 145 150*  --  151* 151*  K  4.0 4.3 4.6  --  4.4 4.0  CL 103 108 112  --  114* 113*  CO2 22 22 22   --  24 27  GLUCOSE 135* 114* 105*  --  96 139*  BUN 74* 67* 66*  --  61* 48*  CREATININE 2.93* 2.09* 1.76*  --  1.59* 1.38*  CALCIUM 8.0* 8.4 8.7  --  8.8 8.6  MG 2.2 2.7* 2.9* 2.8*  --   --   PHOS 4.4 3.9 3.6 3.9  --   --    Estimated Creatinine Clearance: 42.2 ml/min (by C-G formula based on Cr of 1.38).   LIVER  Recent Labs Lab 11/13/13 0455 11/14/13 0340  AST 27 32  ALT 19 24  ALKPHOS 50 36*  BILITOT 0.3 0.5  PROT 7.3 6.1  ALBUMIN 3.4* 3.0*     INFECTIOUS  Recent Labs Lab 11/11/13 2149 11/13/13 0452 11/14/13 0340  LATICACIDVEN 1.29 2.91* 1.4     ENDOCRINE CBG (last 3)  No results found for this basename: GLUCAP,  in the last 72 hours       IMAGING x48h Dg Abd 1 View  11/17/2013   CLINICAL DATA:  NG tube placement.  EXAM: ABDOMEN - 1 VIEW  COMPARISON:  Two views of the abdomen 11/17/2013.  FINDINGS: NG tube is in place with the tip in the fundus of the stomach. Gaseous distention of the stomach has resolved. There is persistent gaseous distention of small and large bowel. No new abnormality is identified.  IMPRESSION: NG tube in good position. Gaseous distention of the stomach has resolved.  Bowel gas pattern most compatible with ileus.   Electronically Signed   By: Inge Rise M.D.   On: 11/17/2013 09:54   Dg Abd Portable 2v  11/18/2013   CLINICAL DATA:  Abdominal distention.  And ileus -obstruction  EXAM: PORTABLE ABDOMEN - 2 VIEW  COMPARISON:  Portable supine abdominal film of November 17, 2013  FINDINGS: There remain loops of mildly distended gas-filled small bowel in the mid abdomen. No free extraluminal gas collections are demonstrated on the left side down decubitus film. No rectal gas is demonstrated. The esophagogastric tube tip and proximal port appear to lie just within the cardia. There is curvature of the mid lumbar spine convex toward the left.  IMPRESSION: 1. The bowel  gas pattern is consistent with an ileus or partial mid to distal small bowel obstruction. There is no evidence of perforation. 2. Advancement of the nasogastric tube by approximately 10 cm is recommended to assure that the proximal port remains below the level of the GE junction.   Electronically Signed   By: David  Martinique   On: 11/18/2013 10:52   Dg Abd Portable 2v  11/17/2013   CLINICAL DATA:  History of bowel perforation  EXAM: PORTABLE ABDOMEN - 2 VIEW  COMPARISON:  Portable abdominal films of November 16, 2013  FINDINGS: The stomach is distended with gas. There are a few loops of gas-filled bowel in the  lower abdomen. No definite extraluminal gas collections are demonstrated. There are degenerative changes of the lumbar spine. The lung bases are clear.  IMPRESSION: There is persistent gaseous distention of the stomach as well as mild small bowel ileus pattern. Nasogastric suction may be useful. No free extraluminal gas collections are demonstrated today.   Electronically Signed   By: David  Martinique   On: 11/17/2013 07:41         ASSESSMENT / PLAN:  PULMONARY OETT A:Hx of COPD. REcent AECOPD 11/11/13 and Rx with prednisone. REturned 11/13/2013 with sigmoid perf. CCS does not advise steroids. Not in resp distress. Long hx tobacco abuse.      -11/18/13: cough and sneeze likely due to NG tube position and irritation. Still on [place]. No wheeze  P:   Advance NG tube Start nasal saline and nasal steroids Recheck CXR 11/19/13 Sccheduled nebs Scheduled pulmicort Hold off systemic steroids Intubate if worse if family so wishes Wean O2 as tolerated START pulmonary toilet  CARDIOVASCULAR Chronic systlioc CHF with AICD A: BP/HR stable P:  Fluids per [facility]  RENAL Lab Results  Component Value Date   CREATININE 1.38* 11/18/2013   CREATININE 1.59* 11/17/2013   CREATININE 1.76* 11/16/2013    A:  Acute on chronic renal insuff. Worse 10/19 but improved 11/18/13 P:   Fluids per  [facility]  GASTROINTESTINAL A:  Sigmoid perf. Considerd mod-high risk pulm complications from surgery. No contraindications   - med mgmt route decided by CCS  P:   Per [facility] and [facility]   HEMATOLOGIC A:  At risk for anemia P:  Per [facility]  INFECTIOUS A:  Sigmoid perf P:   Abx per [facility]   ENDOCRINE CBG (last 3)  No results found for this basename: GLUCAP,  in the last 72 hours   A:  At risk for hyperglycemia   P:   ssi pe [facility]  NEUROLOGIC A:  intact P:   monitor   FAMILY Updates: daughters x 2 updated at bedside. They ar requesting PT and PCCM round again 11/19/13. PCCM can see patient every few days afer that   Dr. Brand Males, M.D., [facility]C.P Pulmonary and Critical Care Medicine Staff Physician [facility] Pager: [phone], If no answer or between  15:00h - 7:00h: call [phone]  11/18/2013 12:12 PM

## 2013-11-18 NOTE — Progress Notes (Signed)
Patient interviewed and examined, agree with NP note above. Much more comfortable after NG.  Now passing flatus.  Can probably get NG out soon and start liquids PO  Edward Jolly MD, FACS  11/18/2013 3:34 PM

## 2013-11-18 NOTE — Progress Notes (Signed)
[facility] PROGRESS NOTE  Fredy Gladu RCV:[id number] DOB: [birth year] DOA: 11/13/2013 PCP: [name], MD Brief narrative 80 year old male with a history of sudden cardiac arrest status post AICD, ischemic cardiomyopathy (EF31% by MRI), COPD (seen in ED 10/16 for acute flare up but went home per his wish), thalassemia, presented with acute onset of abdominal pain on the morning of the admission, a/w some abdominal distention. He presented back to ED 10/18 with main concern of persistent dyspnea and abd pain. CT of the abdomen and pelvis in the ED revealed perforated diverticulitis with pneumoperitoneum. Surgery was consulted and determined that patient has likely a high risk for surgery. Patient himself expressed he did not want to go through surgery although his daughters expressed they would want him to go through surgery if it necessary.  Assessment/Plan: Principal Problem:  Sepsis, secondary to perforated sigmoid diverticulitis / peritonitis / probable Klebsiella UTI   CT abdomen showed sigmoid diverticulitis complicated by free perforation. Urine culture on 11/11/2013 grew Klebsiella pneumoniae. Repeat urine culture - no growth.  -Surgery following and given high-risk recommend medical management for now with serial abdominal exam, pain control , IV fluids and antibiotics. -Increased abdominal distention noted on 10/22  with persistent gaseous distention of the stomach with ileus in small bowel. NG tube placed to suction. Clinically improved today. Followup x-ray still shows ileus versus partial small bowel obstruction -Continue empiric Zosyn. Monitor with serial abdominal exam. -Surgery following. Possibly may remove NG in 1-2 days and start clears, otherwise will nee TNA.   Active Problems:  Acute Respiratory Failure with Hypoxia Secondary to COPD exacerbation and resp distress with underlying sepsis .  Steroids contraindicated due to acute diverticulitis complicated by free  perforation.  -respiratory status is currently stable. -Patient on nighttime 2 L O2 via nasal cannula at home. Currently On nasal cannula and maintaining sats. Continue Inhaled pulmicort and brovana  Continue  Nebs. -When necessary Ativan for anxiety.  Ischemic cardiomyopathy with history of sudden cardiac death, s/p AICD   aspirin and plavix discontinued due to high risk of bleeding.   on IV metoprolol. Add prn hydralazine as BP elevated  Chronic Thrombocytopenia  Likely due to thalassemia..   Acute on chronic blood loss anemia / anemia of chronic disease  Mild drop in H&H noted. Stable.  Klebsiella UTI  On empiric Zosyn and sensitive  Acute on CKD stage 3  Baseline creatinine of 1.6. Improved with hydration. Switched fluid to half normal given hypernatremia  Hypernatremia Secondary to dehydration. Will switched fluid to d5 1/2 NS.monitor on this.   Severe PCM  Poor PO intake and currently n.p.o. Nutrition consult once taking by mouth. May need TNA if unimproved in next 24-48  hrs  DVT Prophylaxis:   SCD's    Code Status: Full.  Family Communication: Daughter at bedside  Disposition Plan: Continue stepdown monitoring. transfer to telemetry in a.m. if stable    Consultants:  [place]  [facility]  Procedures:  CT abdomen and pelvis  Antibiotics:  IV Zosyn since 10/18--  HPI/Subjective: Patient seen and examined. Abdominal distention improved. Still having NG output. Received one dose of IV Ativan for restlessness overnight   Objective: Filed Vitals:   11/18/13 1524  BP:   Pulse:   Temp: 97.9 F (36.6 C)  Resp:     Intake/Output Summary (Last 24 hours) at 11/18/13 1610 Last data filed at 11/18/13 1400  Gross per 24 hour  Intake   1075 ml  Output   2625 ml  Net  -1550 ml   Filed Weights   11/16/13 0500 11/17/13 0419 11/18/13 0500  Weight: 81.5 kg (179 lb 10.8 oz) 85.7 kg (188 lb 15 oz) 81.4 kg (179 lb 7.3 oz)    Exam:   General:   Elderly thin built male in no acute distress  HEENT: dry oral mucosa, NG in place draining bilious fluid  Cardiovascular: S1 and S2 regular, AICD in place  Respiratory: Clear to auscultation bilaterally, no rhonchi wheeze  Abdomen: Soft, non distended, nontender, bowel sounds present, Foley in place  Extremities: Warm, no edema  CNS: Alert and oriented    Data Reviewed: Basic Metabolic Panel:  Recent Labs Lab 11/14/13 0340 11/15/13 0406 11/16/13 0320 11/17/13 0320 11/17/13 0330 11/18/13 0536  NA 139 145 150*  --  151* 151*  K 4.0 4.3 4.6  --  4.4 4.0  CL 103 108 112  --  114* 113*  CO2 22 22 22   --  24 27  GLUCOSE 135* 114* 105*  --  96 139*  BUN 74* 67* 66*  --  61* 48*  CREATININE 2.93* 2.09* 1.76*  --  1.59* 1.38*  CALCIUM 8.0* 8.4 8.7  --  8.8 8.6  MG 2.2 2.7* 2.9* 2.8*  --   --   PHOS 4.4 3.9 3.6 3.9  --   --    Liver Function Tests:  Recent Labs Lab 11/13/13 0455 11/14/13 0340  AST 27 32  ALT 19 24  ALKPHOS 50 36*  BILITOT 0.3 0.5  PROT 7.3 6.1  ALBUMIN 3.4* 3.0*    Recent Labs Lab 11/13/13 0455  LIPASE 19   No results found for this basename: AMMONIA,  in the last 168 hours CBC:  Recent Labs Lab 11/13/13 0455 11/14/13 0340 11/14/13 1213 11/15/13 0406 11/16/13 0320 11/17/13 0320  WBC 16.0* 7.9 9.2 6.6 5.1 4.0  NEUTROABS 14.8* 7.4  --  6.0 4.4 3.2  HGB 10.6* 7.8* 7.9* 8.0* 8.0* 8.1*  HCT 32.6* 23.9* 24.2* 24.9* 25.2* 25.3*  MCV 66.1* 66.6* 65.4* 67.3* 66.7* 67.1*  PLT 125* 69* 84* 73* 73* 77*   Cardiac Enzymes:  Recent Labs Lab 11/11/13 2139 11/13/13 0456  TROPONINI <0.30 <0.30   BNP (last 3 results)  Recent Labs  07/01/13 0625 11/11/13 2139  PROBNP 330.2 759.3*   CBG: No results found for this basename: GLUCAP,  in the last 168 hours  Recent Results (from the past 240 hour(s))  URINE CULTURE     Status: None   Collection Time    11/11/13  9:57 PM      Result Value Ref Range Status   Specimen Description URINE,  CATHETERIZED   Final   Special Requests NONE   Final   Culture  Setup Time     Final   Value: 11/12/2013 02:10     Performed at [place]     Final   Value: >=100,000 COLONIES/ML     Performed at [facility]   Culture     Final   Value: KLEBSIELLA PNEUMONIAE     Performed at [facility]   Report Status 11/14/2013 FINAL   Final   Organism ID, Bacteria KLEBSIELLA PNEUMONIAE   Final  CULTURE, BLOOD (ROUTINE X 2)     Status: None   Collection Time    11/13/13  6:10 AM      Result Value Ref Range Status   Specimen Description BLOOD RIGHT ANTECUBITAL   Final  Special Requests BOTTLES DRAWN AEROBIC AND ANAEROBIC [facility] EACH   Final   Culture  Setup Time     Final   Value: 11/13/2013 15:19     Performed at [facility]   Culture     Final   Value:        BLOOD CULTURE RECEIVED NO GROWTH TO DATE CULTURE WILL BE HELD FOR 5 DAYS BEFORE ISSUING A FINAL NEGATIVE REPORT     Performed at [facility]   Report Status PENDING   Incomplete  CULTURE, BLOOD (ROUTINE X 2)     Status: None   Collection Time    11/13/13  6:11 AM      Result Value Ref Range Status   Specimen Description BLOOD LEFT ANTECUBITAL   Final   Special Requests BOTTLES DRAWN AEROBIC AND ANAEROBIC 5CC EACH   Final   Culture  Setup Time     Final   Value: 11/13/2013 15:19     Performed at [facility]   Culture     Final   Value:        BLOOD CULTURE RECEIVED NO GROWTH TO DATE CULTURE WILL BE HELD FOR 5 DAYS BEFORE ISSUING A FINAL NEGATIVE REPORT     Performed at [facility]   Report Status PENDING   Incomplete  URINE CULTURE     Status: None   Collection Time    11/13/13  6:50 AM      Result Value Ref Range Status   Specimen Description URINE, CATHETERIZED   Final   Special Requests NONE   Final   Culture  Setup Time     Final   Value: 11/13/2013 15:40     Performed at [facility]     Final   Value: NO GROWTH      Performed at [facility]   Culture     Final   Value: NO GROWTH     Performed at [facility]   Report Status 11/14/2013 FINAL   Final  MRSA PCR SCREENING     Status: None   Collection Time    11/13/13 10:05 AM      Result Value Ref Range Status   MRSA by PCR NEGATIVE  NEGATIVE Final   Comment:            The GeneXpert MRSA Assay (FDA     approved for NASAL specimens     only), is one component of a     comprehensive MRSA colonization     surveillance program. It is not     intended to diagnose MRSA     infection nor to guide or     monitor treatment for     MRSA infections.  CULTURE, EXPECTORATED SPUTUM-ASSESSMENT     Status: None   Collection Time    11/15/13 11:51 PM      Result Value Ref Range Status   Specimen Description SPUTUM   Final   Special Requests Normal   Final   Sputum evaluation     Final   Value: THIS SPECIMEN IS ACCEPTABLE. RESPIRATORY CULTURE REPORT TO FOLLOW.   Report Status 11/16/2013 FINAL   Final  CULTURE, RESPIRATORY (NON-EXPECTORATED)     Status: None   Collection Time    11/15/13 11:51 PM      Result Value Ref Range Status   Specimen Description SPUTUM   Final   Special Requests NONE   Final  Gram Stain     Final   Value: FEW WBC PRESENT, PREDOMINANTLY PMN     FEW SQUAMOUS EPITHELIAL CELLS PRESENT     RARE YEAST     Performed at [facility]   Culture     Final   Value: FEW CANDIDA ALBICANS     Performed at [facility]   Report Status 11/18/2013 FINAL   Final     Studies: Dg Abd 1 View  11/17/2013   CLINICAL DATA:  NG tube placement.  EXAM: ABDOMEN - 1 VIEW  COMPARISON:  Two views of the abdomen 11/17/2013.  FINDINGS: NG tube is in place with the tip in the fundus of the stomach. Gaseous distention of the stomach has resolved. There is persistent gaseous distention of small and large bowel. No new abnormality is identified.  IMPRESSION: NG tube in good position. Gaseous distention of the stomach has  resolved.  Bowel gas pattern most compatible with ileus.   Electronically Signed   By: Inge Rise M.D.   On: 11/17/2013 09:54   Dg Abd Portable 1v  11/18/2013   CLINICAL DATA:  Small bowel obstruction; interval advancement of the nasogastric tube by 10 cm ; reassess nasogastric tube positioning  EXAM: PORTABLE ABDOMEN - 1 VIEW  COMPARISON:  Abdominal film of earlier today at 10:13 a.m.  FINDINGS: The tip of the nasogastric tube extends below the GE junction but is now is oriented retrograde toward the GE junction. The proximal port lies below the level of the GE junction.  IMPRESSION: The proximal port and tip of the esophagogastric tube now lie below the expected location of the GE junction although the tip of the tube is now directed toward the GE junction. Advancement by an additional 10 cm may allow the tube to proceed further into the gastric body.   Electronically Signed   By: David  Martinique   On: 11/18/2013 14:58   Dg Abd Portable 2v  11/18/2013   CLINICAL DATA:  Abdominal distention.  And ileus -obstruction  EXAM: PORTABLE ABDOMEN - 2 VIEW  COMPARISON:  Portable supine abdominal film of November 17, 2013  FINDINGS: There remain loops of mildly distended gas-filled small bowel in the mid abdomen. No free extraluminal gas collections are demonstrated on the left side down decubitus film. No rectal gas is demonstrated. The esophagogastric tube tip and proximal port appear to lie just within the cardia. There is curvature of the mid lumbar spine convex toward the left.  IMPRESSION: 1. The bowel gas pattern is consistent with an ileus or partial mid to distal small bowel obstruction. There is no evidence of perforation. 2. Advancement of the nasogastric tube by approximately 10 cm is recommended to assure that the proximal port remains below the level of the GE junction.   Electronically Signed   By: David  Martin[place]   On: 11/18/2013 10:52   Dg Abd Portable 2v  11/17/2013   CLINICAL DATA:  History of  bowel perforation  EXAM: PORTABLE ABDOMEN - 2 VIEW  COMPARISON:  Portable abdominal films of November 16, 2013  FINDINGS: The stomach is distended with gas. There are a few loops of gas-filled bowel in the lower abdomen. No definite extraluminal gas collections are demonstrated. There are degenerative changes of the lumbar spine. The lung bases are clear.  IMPRESSION: There is persistent gaseous distention of the stomach as well as mild small bowel ileus pattern. Nasogastric suction may be useful. No free extraluminal gas collections are demonstrated today.  Electronically Signed   By: David  Martin[place]   On: 11/17/2013 07:41    Scheduled Meds: . antiseptic oral rinse  7 mL Mouth Rinse BID  . arformoterol  15 mcg Nebulization BID  . budesonide  0.5 mg Nebulization BID  . fluticasone  2 spray Each Nare BID  . ipratropium-albuterol  3 mL Nebulization Q4H  . magic mouthwash  10 mL Oral QID  . pantoprazole (PROTONIX) IV  40 mg Intravenous Q12H  . piperacillin-tazobactam (ZOSYN)  IV  3.375 g Intravenous 3 times per day  . sodium chloride  2 spray Each Nare BID  . sodium chloride  3 mL Intravenous Q12H   Continuous Infusions: . dextrose 5 % and 0.45% NaCl 75 mL/hr at 11/17/13 2301      Time spent: 35 minutes    Angelette Ganus  [facility] Pager [phone]. If 7PM-7AM, please contact night-coverage at [email or web address], password [facility] 11/18/2013, 4:10 PM  LOS: 5 days

## 2013-11-18 NOTE — Progress Notes (Signed)
As per MD order, NG advanced by 10 cm after administration of numbing spray to L nare. Follow up xray showed NG needed to be advanced 10 more cm. Numbing spray administered and NG advanced 10 more cm. Follow up xray to be completed in AM of 10/24. Pt tolerated both advancements without complication.

## 2013-11-19 ENCOUNTER — Inpatient Hospital Stay (HOSPITAL_COMMUNITY): Payer: Medicare Other

## 2013-11-19 DIAGNOSIS — N170 Acute kidney failure with tubular necrosis: Secondary | ICD-10-CM

## 2013-11-19 LAB — CBC
HEMATOCRIT: 25.6 % — AB (ref 39.0–52.0)
HEMOGLOBIN: 8 g/dL — AB (ref 13.0–17.0)
MCH: 21.1 pg — AB (ref 26.0–34.0)
MCHC: 31.3 g/dL (ref 30.0–36.0)
MCV: 67.5 fL — AB (ref 78.0–100.0)
Platelets: 95 10*3/uL — ABNORMAL LOW (ref 150–400)
RBC: 3.79 MIL/uL — ABNORMAL LOW (ref 4.22–5.81)
RDW: 16.9 % — ABNORMAL HIGH (ref 11.5–15.5)
WBC: 4.5 10*3/uL (ref 4.0–10.5)

## 2013-11-19 LAB — BASIC METABOLIC PANEL
Anion gap: 10 (ref 5–15)
BUN: 39 mg/dL — AB (ref 6–23)
CHLORIDE: 113 meq/L — AB (ref 96–112)
CO2: 29 meq/L (ref 19–32)
CREATININE: 1.43 mg/dL — AB (ref 0.50–1.35)
Calcium: 8.3 mg/dL — ABNORMAL LOW (ref 8.4–10.5)
GFR calc non Af Amer: 43 mL/min — ABNORMAL LOW (ref >90)
GFR, EST AFRICAN AMERICAN: 50 mL/min — AB (ref >90)
GLUCOSE: 114 mg/dL — AB (ref 70–99)
Potassium: 3.8 mEq/L (ref 3.7–5.3)
Sodium: 152 mEq/L — ABNORMAL HIGH (ref 137–147)

## 2013-11-19 LAB — CULTURE, BLOOD (ROUTINE X 2)
CULTURE: NO GROWTH
Culture: NO GROWTH

## 2013-11-19 LAB — PHOSPHORUS: Phosphorus: 3.4 mg/dL (ref 2.3–4.6)

## 2013-11-19 LAB — MAGNESIUM: Magnesium: 2.4 mg/dL (ref 1.5–2.5)

## 2013-11-19 MED ORDER — DEXTROSE 5 % IV SOLN
INTRAVENOUS | Status: DC
  Administered 2013-11-19: 50 mL via INTRAVENOUS
  Administered 2013-11-20 – 2013-11-21 (×3): via INTRAVENOUS

## 2013-11-19 NOTE — Progress Notes (Signed)
Subjective: Pt doing well.  No abd pain KUB noted  Objective: Vital signs in last 24 hours: Temp:  [97.8 F (36.6 C)-99.2 F (37.3 C)] 99.2 F (37.3 C) (10/24 0400) Pulse Rate:  [59-82] 65 (10/24 0600) Resp:  [21-24] 22 (10/24 0600) BP: (132-169)/(53-74) 151/64 mmHg (10/24 0600) SpO2:  [96 %-100 %] 97 % (10/24 0813) Weight:  [178 lb 9.2 oz (81 kg)] 178 lb 9.2 oz (81 kg) (10/24 0400) Last BM Date: 11/18/13  Intake/Output from previous day: 10/23 0701 - 10/24 0700 In: 1881.3 [I.V.:1831.3; IV Piggyback:50] Out: 1800 [Urine:1800] Intake/Output this shift:    General appearance: alert and cooperative Resp: clear to auscultation bilaterally Cardio: regular rate and rhythm, S1, S2 normal, no murmur, click, rub or gallop GI: soft, nttp, nd active BS  Lab Results:   Recent Labs  11/17/13 0320 11/19/13 0357  WBC 4.0 4.5  HGB 8.1* 8.0*  HCT 25.3* 25.6*  PLT 77* 95*   BMET  Recent Labs  11/18/13 0536 11/19/13 0357  NA 151* 152*  K 4.0 3.8  CL 113* 113*  CO2 27 29  GLUCOSE 139* 114*  BUN 48* 39*  CREATININE 1.38* 1.43*  CALCIUM 8.6 8.3*   PT/INR No results found for this basename: LABPROT, INR,  in the last 72 hours ABG No results found for this basename: PHART, PCO2, PO2, HCO3,  in the last 72 hours  Studies/Results: Dg Abd 1 View  11/17/2013   CLINICAL DATA:  NG tube placement.  EXAM: ABDOMEN - 1 VIEW  COMPARISON:  Two views of the abdomen 11/17/2013.  FINDINGS: NG tube is in place with the tip in the fundus of the stomach. Gaseous distention of the stomach has resolved. There is persistent gaseous distention of small and large bowel. No new abnormality is identified.  IMPRESSION: NG tube in good position. Gaseous distention of the stomach has resolved.  Bowel gas pattern most compatible with ileus.   Electronically Signed   By: Inge Rise M.D.   On: 11/17/2013 09:54   Dg Chest Port 1 View  11/19/2013   CLINICAL DATA:  Acute respiratory failure.  Coronary artery disease with cardiomyopathy. Diabetes. Emphysema.  EXAM: PORTABLE CHEST - 1 VIEW  COMPARISON:  11/16/2013  FINDINGS: The patient is rotated to the right on today's radiograph, reducing diagnostic sensitivity and specificity. Nasogastric tube enters the stomach. Dual lead pacer is in place.  Emphysema noted with scattered scarring and interstitial accentuation in the lungs. The interstitial accentuation and indistinctness of the pulmonary vasculature is mildly improved compared to prior. No new airspace opacity. No pleural effusion identified.  Tortuous thoracic aorta.  IMPRESSION: 1. Emphysema with suspected low grade superimposed interstitial edema, although improved from 11/16/2013.   Electronically Signed   By: Sherryl Barters M.D.   On: 11/19/2013 08:23   Dg Abd Portable 1v  11/18/2013   CLINICAL DATA:  Small bowel obstruction; interval advancement of the nasogastric tube by 10 cm ; reassess nasogastric tube positioning  EXAM: PORTABLE ABDOMEN - 1 VIEW  COMPARISON:  Abdominal film of earlier today at 10:13 a.m.  FINDINGS: The tip of the nasogastric tube extends below the GE junction but is now is oriented retrograde toward the GE junction. The proximal port lies below the level of the GE junction.  IMPRESSION: The proximal port and tip of the esophagogastric tube now lie below the expected location of the GE junction although the tip of the tube is now directed toward the GE junction. Advancement by an  additional 10 cm may allow the tube to proceed further into the gastric body.   Electronically Signed   By: David  [place]   On: 11/18/2013 14:58   Dg Abd Portable 2v  11/19/2013   CLINICAL DATA:  Ileus, diverticulosis, subsequent encounter  EXAM: PORTABLE ABDOMEN - 2 VIEW  COMPARISON:  11/18/2013  FINDINGS: NG tube remains within the stomach. Scattered air throughout the bowel with mild distention but no significant obstruction pattern or ileus. No gross free air on the decubitus view.  Chronic degenerative changes of the spine and pelvis. Diffuse patchy osteopenia as before. No significant interval change.  IMPRESSION: Nonspecific bowel gas pattern without obstruction. No gross free air.  Stable exam.   Electronically Signed   By: Daryll Brod M.D.   On: 11/19/2013 08:25   Dg Abd Portable 2v  11/18/2013   CLINICAL DATA:  Abdominal distention.  And ileus -obstruction  EXAM: PORTABLE ABDOMEN - 2 VIEW  COMPARISON:  Portable supine abdominal film of November 17, 2013  FINDINGS: There remain loops of mildly distended gas-filled small bowel in the mid abdomen. No free extraluminal gas collections are demonstrated on the left side down decubitus film. No rectal gas is demonstrated. The esophagogastric tube tip and proximal port appear to lie just within the cardia. There is curvature of the mid lumbar spine convex toward the left.  IMPRESSION: 1. The bowel gas pattern is consistent with an ileus or partial mid to distal small bowel obstruction. There is no evidence of perforation. 2. Advancement of the nasogastric tube by approximately 10 cm is recommended to assure that the proximal port remains below the level of the GE junction.   Electronically Signed   By: David  [place]   On: 11/18/2013 10:52    Anti-infectives: Anti-infectives   Start     Dose/Rate Route Frequency Ordered Stop   11/13/13 1400  piperacillin-tazobactam (ZOSYN) IVPB 3.375 g  Status:  Discontinued     3.375 g 100 mL/hr over 30 Minutes Intravenous 3 times per day 11/13/13 1035 11/13/13 1048   11/13/13 1400  piperacillin-tazobactam (ZOSYN) IVPB 3.375 g     3.375 g 12.5 mL/hr over 240 Minutes Intravenous 3 times per day 11/13/13 1052     11/13/13 0900  ertapenem (INVANZ) 1 g in sodium chloride 0.9 % 50 mL IVPB  Status:  Discontinued     1 g 100 mL/hr over 30 Minutes Intravenous Every 24 hours 11/13/13 0824 11/13/13 1112   11/13/13 0645  piperacillin-tazobactam (ZOSYN) IVPB 3.375 g     3.375 g 12.5 mL/hr over 240  Minutes Intravenous  Once 11/13/13 0636 11/13/13 0737      Assessment/Plan: Perforated diverticulitis Advance diet to FLD DC NGT con't Abx OOBTC  LOS: 6 days    Rosario Jacks., Anne Hahn 11/19/2013

## 2013-11-19 NOTE — Progress Notes (Signed)
Discuss with patient benefits and risks of PICC line.  Patient states does not want PICC and would rather have PIV.  PIV started in left forearm.  Please contact if patient changes decision in the future.

## 2013-11-19 NOTE — Progress Notes (Signed)
PULMONARY / CRITICAL CARE MEDICINE   Name: [name] [name] MRN: [id number] DOB: April 13, 1925    ADMISSION DATE:  11/13/2013 CONSULTATION DATE:  11/13/2013   REFERRING MD :  Dr Shanon Brow [facility]  CHIEF COMPLAINT:  Respiratory Distress in setting of COPD,  Cardiomyopathy, and GI perf. 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   The patient was in the emergency department on 11/11/2013. He was diagnosed with a COPD exacerbation. Admission was recommended, but the patient wanted to go home. He was sent home with prednisone and ciprofloxacin. He presented again today with shortness of breath that was not much better as well as abdominal pain. CT of the abdomen and pelvis in the emergency department revealed perforated diverticulitis with pneumoperitoneum. The patient was given a dose of Zosyn as well as fentanyl x2 IV and albuterol nebulizer. General surgery, Dr. Zella Richer was consulted to see the patient. After he evaluated the patient, the surgical plan was to treat the patient medically without any surgical intervention.  In the ER patient noticed to be tachypneic and wheezing but surgery strongly recommended against steroids given GI perf and need to medically manage due to high medical risk for post operative complications.   Patient nearly got intubated but improvved after nebs, o2 and ? O2. PCCM asked to consult for resp failure mgmt  Family and patient undecided about code status   STUDIES /EVENTS 11/13/2013 - admit 10/20 looks better, less distressed.  10/22 NGT placed per CCS   SUBJECTIVE/OVERNIGHT/INTERVAL HX CCS to dc NGT Not sob  \VITAL SIGNS: Temp:  [97.8 F (36.6 C)-99.2 F (37.3 C)] 99.2 F (37.3 C) (10/24 0400) Pulse Rate:   [59-82] 65 (10/24 0600) Resp:  [21-24] 22 (10/24 0600) BP: (132-169)/(53-74) 151/64 mmHg (10/24 0600) SpO2:  [96 %-100 %] 97 % (10/24 0813) Weight:  [81 kg (178 lb 9.2 oz)] 81 kg (178 lb 9.2 oz) (10/24 0400) HEMODYNAMICS:   VENTILATOR SETTINGS:   INTAKE / OUTPUT:  Intake/Output Summary (Last 24 hours) at 11/19/13 0926 Last data filed at 11/19/13 0700  Gross per 24 hour  Intake 1881.25 ml  Output   1400 ml  Net 481.25 ml    PHYSICAL EXAMINATION: General:  Elderly male. No distress Neuro:  A.xo3. Speech normal HEENT:   JVD wnl Cardiovascular: . Normal heart sounds RRR Lungs:  No distress, some cough, coarse Abdomen:  Distended, mildly tender lower abdomen unchanged, decreased bs, soft Musculoskeletal:  No edema Skin:  Intact anteriorly  LABS: PULMONARY  Recent Labs Lab 11/13/13 0501  HCO3 22.6  TCO2 21.9  O2SAT 23.3    CBC  Recent Labs Lab 11/16/13 0320 11/17/13 0320 11/19/13 0357  HGB 8.0* 8.1* 8.0*  HCT 25.2* 25.3* 25.6*  WBC 5.1 4.0 4.5  PLT 73* 77* 95*    COAGULATION No results found for this basename: INR,  in the last 168 hours  CARDIAC    Recent Labs Lab 11/13/13 0456  TROPONINI <0.30   No results found for this basename: PROBNP,  in the last 168 hours   [facility] 11/14/13 0340 11/15/13 0406 11/16/13 0320 11/17/13 0320 11/17/13 0330 11/18/13 0536 11/19/13 0357  NA 139 145 150*  --  151* 151* 152*  K 4.0 4.3 4.6  --  4.4 4.0 3.8  CL 103 108 112  --  114* 113* 113*  CO2 22 22 22   --  24 27 29   GLUCOSE 135* 114* 105*  --  96 139* 114*  BUN 74* 67* 66*  --  61* 48* 39*  CREATININE 2.93* 2.09* 1.76*  --  1.59* 1.38* 1.43*  CALCIUM 8.0* 8.4 8.7  --  8.8 8.6 8.3*  MG 2.2 2.7* 2.9* 2.8*  --   --  2.4  PHOS 4.4 3.9 3.6 3.9  --   --  3.4   Estimated Creatinine Clearance: 40.7 ml/min (by C-G formula based on Cr of 1.43).   LIVER  Recent Labs Lab 11/13/13 0455 11/14/13 0340  AST 27 32  ALT 19 24  ALKPHOS 50  36*  BILITOT 0.3 0.5  PROT 7.3 6.1  ALBUMIN 3.4* 3.0*     INFECTIOUS  Recent Labs Lab 11/13/13 0452 11/14/13 0340  LATICACIDVEN 2.91* 1.4     ENDOCRINE CBG (last 3)  No results found for this basename: GLUCAP,  in the last 72 hours       IMAGING x48h Dg Abd 1 View  11/17/2013   CLINICAL DATA:  NG tube placement.  EXAM: ABDOMEN - 1 VIEW  COMPARISON:  Two views of the abdomen 11/17/2013.  FINDINGS: NG tube is in place with the tip in the fundus of the stomach. Gaseous distention of the stomach has resolved. There is persistent gaseous distention of small and large bowel. No new abnormality is identified.  IMPRESSION: NG tube in good position. Gaseous distention of the stomach has resolved.  Bowel gas pattern most compatible with ileus.   Electronically Signed   By: Inge Rise M.D.   On: 11/17/2013 09:54   Dg Chest Port 1 View  11/19/2013   CLINICAL DATA:  Acute respiratory failure. Coronary artery disease with cardiomyopathy. Diabetes. Emphysema.  EXAM: PORTABLE CHEST - 1 VIEW  COMPARISON:  11/16/2013  FINDINGS: The patient is rotated to the right on today's radiograph, reducing diagnostic sensitivity and specificity. Nasogastric tube enters the stomach. Dual lead pacer is in place.  Emphysema noted with scattered scarring and interstitial accentuation in the lungs. The interstitial accentuation and indistinctness of the pulmonary vasculature is mildly improved compared to prior. No new airspace opacity. No pleural effusion identified.  Tortuous thoracic aorta.  IMPRESSION: 1. Emphysema with suspected low grade superimposed interstitial edema, although improved from 11/16/2013.   Electronically Signed   By: Sherryl Barters M.D.   On: 11/19/2013 08:23   Dg Abd Portable 1v  11/18/2013   CLINICAL DATA:  Small bowel obstruction; interval advancement of the nasogastric tube by 10 cm ; reassess nasogastric tube positioning  EXAM: PORTABLE ABDOMEN - 1 VIEW  COMPARISON:  Abdominal  film of earlier today at 10:13 a.m.  FINDINGS: The tip of the nasogastric tube extends below the GE junction but is now is oriented retrograde toward the GE junction. The proximal port lies below the level of the GE junction.  IMPRESSION: The proximal port and tip of the esophagogastric tube now lie below the expected location of the GE junction although the tip of the tube is now directed toward the GE junction. Advancement by an additional 10 cm may allow the tube to proceed further into the gastric body.   Electronically Signed   By: David  Martinique   On: 11/18/2013 14:58   Dg Abd Portable 2v  11/19/2013   CLINICAL DATA:  Ileus, diverticulosis, subsequent encounter  EXAM: PORTABLE ABDOMEN - 2 VIEW  COMPARISON:  11/18/2013  FINDINGS: NG tube remains within the stomach. Scattered air throughout the bowel with mild distention but no significant obstruction pattern or ileus. No gross free air on the decubitus view. Chronic degenerative changes of the spine and pelvis. Diffuse patchy osteopenia as before. No significant interval change.  IMPRESSION: Nonspecific bowel gas pattern without obstruction. No gross free air.  Stable exam.   Electronically Signed   By: Daryll Brod M.D.   On: 11/19/2013 08:25   Dg Abd Portable 2v  11/18/2013   CLINICAL DATA:  Abdominal distention.  And ileus -obstruction  EXAM: PORTABLE ABDOMEN - 2 VIEW  COMPARISON:  Portable supine abdominal film of November 17, 2013  FINDINGS: There remain loops of mildly distended gas-filled small bowel in the mid abdomen. No free extraluminal gas collections are demonstrated on the left side down decubitus film. No rectal gas is demonstrated. The esophagogastric tube tip and proximal port appear to lie just within the cardia. There is curvature of the mid lumbar spine convex toward the left.  IMPRESSION: 1. The bowel gas pattern is consistent with an ileus or partial mid to distal small bowel obstruction. There is no evidence of perforation. 2.  Advancement of the nasogastric tube by approximately 10 cm is recommended to assure that the proximal port remains below the level of the GE junction.   Electronically Signed   By: David  Martinique   On: 11/18/2013 10:52     ASSESSMENT / PLAN:  PULMONARY OETT A:Hx of COPD. REcent AECOPD 11/11/13 and Rx with prednisone. REturned 11/13/2013 with sigmoid perf. CCS does not advise steroids. Not in resp distress. Long hx tobacco abuse.    -11/18/13: cough and sneeze likely due to NG tube position and irritation. Still on [place]. No wheeze  P:   Orders noted to dc NG tube, hope will help symptoms IS nasal saline and nasal steroids - refused overall Sccheduled nebs Scheduled pulmicort Avoiding steroids IV as able Wean O2 as tolerated, goal to home 2 liters pulmonary toilet  GASTROINTESTINAL A:  Sigmoid perf. Considerd mod-high risk pulm complications from surgery. No contraindications  - med mgmt route decided by CCS  P:   Per CCS and [facility] To dc NGT and feed per CCS  HYpernatremia worse on 1/2 NS Change to d5  HEMATOLOGIC A:  At risk for anemia, dvt prevention P:  scd Consider sub q hep if plat rebounds  INFECTIOUS A:  Sigmoid perf P:   Abx per [facility] Would consider full course nosocomial zosyn without narrow   FAMILY Updates: daughters x 2 updated    wil see again Monday Call if needed  [facility] ngt out will help, change to d5w, keep zosyn, consider 10 days abx if progressing as is  Lavon Paganini. Titus Mould, MD, [place] Pgr: [facility]

## 2013-11-19 NOTE — Progress Notes (Addendum)
Patient ID: Kalden Wanke, male   DOB: April 17, 1925, 80 y.o.   MRN: [id number] [facility] PROGRESS NOTE  Gailen Venne KDX:[id number] DOB: 1925/06/27 DOA: 11/13/2013 PCP: [name], MD  Brief narrative: 80 year old male with a history of sudden cardiac arrest status post AICD, ischemic cardiomyopathy (EF31% by MRI), COPD (seen in ED 10/16 for acute flare up but went home per his wish), thalassemia who presented to [facility] ED 11/13/2013 with acute onset of abdominal pain on the morning of the admission, a/w some abdominal distention. CT of the abdomen and pelvis in the ED revealed perforated diverticulitis with pneumoperitoneum. Per surgery, patient was determined to be high risk for surgery. Patient himself expressed he did not want to go through surgery although his daughters expressed they would want him to go through surgery if it necessary.   Assessment/Plan:   Principal Problem:  Sepsis, secondary to perforated sigmoid diverticulitis / peritonitis / ileus and/or partial small bowel obstruction - Patient is hemodynamically stable at this time, does not require pressor support. - Patient was admitted with abdominal distention and CT abdomen/pelvis showed acute sigmoid diverticulitis complicated by a free perforation. In addition patient had urine cultures growing Klebsiella pneumonia (done on previous visit 11/11/2013). Please note that repeat urine culture on this admission shows no growth. - Per surgery, given high risk for surgical intervention, recommendation is for medical management with serial abdominal exams, NG tube, n.p.o., IV fluids, antibiotics and antiemetics as needed. Continue pain management efforts with Dilaudid 0.5 mg IV every 2 hours as needed. - As mentioned, patient has NG tube in place. Patient was noted to have more abdominal distention on 11/17/2013. Abdominal x-ray revealed ileus versus partial small bowel obstruction. Abdominal x-ray this morning shows stable exam,  nonspecific bowel gas pattern without obstruction. Abdomen less distended this morning. Patient however still has significant output through NG tube. Appreciate surgery recommendations.  Active Problems:  Acute Respiratory Failure with Hypoxia  - Secondary to COPD exacerbation and resp distress with underlying sepsis  - per surgery, steroids are contraindicated because of acute diverticulitis complicated by free perforation. - Respiratory status is stable at this time. Patient is saturating 96% with nasal cannula oxygen support, 2 L. - Continue Brovana nebulizer treatment twice daily, Pulmicort nebulizer twice daily, duo neb nebulizer every 4 hours. Continue albuterol nebulizer every 2 hours as needed for shortness of breath or wheezing. - Ativan 1 mg IV every 8 hours as needed for anxiety. Ischemic cardiomyopathy with history of sudden cardiac death, s/p AICD  - Aspirin and Plavix still on hold because of high risk of bleeding. -  Continue he Trellis and 20 mg IV every 6 hours as needed for blood pressure control. Blood pressure is 151/64. Klebsiella UTI - Urine culture on 11/11/2013 grew Klebsiella pneumoniae. Sensitivity report confirmed sensitivity to Zosyn. Urine culture on this admission shows no growth. Chronic Thrombocytopenia  - Likely due to thalassemia. Patient has scattered ecchymosis over upper extremities. No bleeding from mucosal surfaces. - Platelet count is 95 this morning. Acute on chronic blood loss anemia / anemia of chronic disease  - Mild drop in H&H noted. Hemoglobin is 8 this morning. - Continue to monitor CBC daily.  Acute on CKD stage 3  - Baseline creatinine of 1.6. Improved with hydration. Switched fluid to half normal given hypernatremia  Hypernatremia  - Secondary to dehydration. Continue current IV fluids. - will need to monitor urine output since no signficant improvement in hypernatremia. Severe PCM  - Poor PO intake and currently  n.p.o. Nutrition consult  once taking by mouth. May need TNA if unimproved in next 24-48 hrs   DVT Prophylaxis:  SCD's     Code Status: Full.  Family Communication:  plan of care discussed with the patient and his family at the bedside  Disposition Plan: remains in SDU; PT evaluation. Encouraged patient is sitting in chair.   IV Access:   Peripheral IV Procedures and diagnostic studies:   Dg Abd 1 View 11/17/2013    NG tube in good position. Gaseous distention of the stomach has resolved.  Bowel gas pattern most compatible with ileus.   Electronically Signed   By: Inge Rise M.D.   On: 11/17/2013 09:54   Dg Chest Port 1 View 11/19/2013    1. Emphysema with suspected low grade superimposed interstitial edema, although improved from 11/16/2013.   Electronically Signed   By: Sherryl Barters M.D.   On: 11/19/2013 08:23   Dg Abd Portable 1v 11/18/2013   The proximal port and tip of the esophagogastric tube now lie below the expected location of the GE junction although the tip of the tube is now directed toward the GE junction. Advancement by an additional 10 cm may allow the tube to proceed further into the gastric body.   Electronically Signed   By: David  Martinique   On: 11/18/2013 14:58   Dg Abd Portable 2v 11/19/2013  Nonspecific bowel gas pattern without obstruction. No gross free air.  Stable exam.   Electronically Signed   By: Daryll Brod M.D.   On: 11/19/2013 08:25   Dg Abd Portable 2v 11/18/2013  1. The bowel gas pattern is consistent with an ileus or partial mid to distal small bowel obstruction. There is no evidence of perforation. 2. Advancement of the nasogastric tube by approximately 10 cm is recommended to assure that the proximal port remains below the level of the GE junction.   Electronically Signed   By: David  Martinique   On: 11/18/2013 10:52   Medical Consultants:   [facility]  [facility] Other Consultants:   PT  Anti-Infectives:   Zosyn 11/13/2013 -->    Leisa Lenz, MD  [facility] Pager [phone]  If 7PM-7AM, please contact night-coverage [email or web address] Password [facility] 11/19/2013, 8:37 AM   LOS: 6 days    HPI/Subjective: No acute overnight events.  Objective: Filed Vitals:   11/19/13 0400 11/19/13 0505 11/19/13 0600 11/19/13 0813  BP: 151/60  151/64   Pulse: 60  65   Temp: 99.2 F (37.3 C)     TempSrc: Axillary     Resp: 21  22   Height:      Weight: 81 kg (178 lb 9.2 oz)     SpO2: 100% 96% 100% 97%    Intake/Output Summary (Last 24 hours) at 11/19/13 0837 Last data filed at 11/19/13 0700  Gross per 24 hour  Intake 1881.25 ml  Output   1400 ml  Net 481.25 ml    Exam:   General:  Pt is alert, follows commands appropriately, not in acute distress  Cardiovascular: Regular rate and rhythm, S1/S2 appreciated  Respiratory: Clear to auscultation bilaterally, no wheezing, no crackles, no rhonchi  Abdomen: Less distended, appreciate bowel sounds, no significant tenderness on palpation  Extremities: No edema, pulses DP and PT palpable bilaterally  Neuro: Grossly nonfocal  Data Reviewed: Basic Metabolic Panel:  Recent Labs Lab 11/14/13 0340 11/15/13 0406 11/16/13 0320 11/17/13 0320 11/17/13 0330 11/18/13 0536 11/19/13 0357  NA 139 145  150*  --  151* 151* 152*  K 4.0 4.3 4.6  --  4.4 4.0 3.8  CL 103 108 112  --  114* 113* 113*  CO2 22 22 22   --  24 27 29   GLUCOSE 135* 114* 105*  --  96 139* 114*  BUN 74* 67* 66*  --  61* 48* 39*  CREATININE 2.93* 2.09* 1.76*  --  1.59* 1.38* 1.43*  CALCIUM 8.0* 8.4 8.7  --  8.8 8.6 8.3*  MG 2.2 2.7* 2.9* 2.8*  --   --  2.4  PHOS 4.4 3.9 3.6 3.9  --   --  3.4   Liver Function Tests:  Recent Labs Lab 11/13/13 0455 11/14/13 0340  AST 27 32  ALT 19 24  ALKPHOS 50 36*  BILITOT 0.3 0.5  PROT 7.3 6.1  ALBUMIN 3.4* 3.0*    Recent Labs Lab 11/13/13 0455  LIPASE 19   No results found for this basename: AMMONIA,  in the last 168 hours CBC:  Recent Labs Lab 11/13/13 0455  11/14/13 0340 11/14/13 1213 11/15/13 0406 11/16/13 0320 11/17/13 0320 11/19/13 0357  WBC 16.0* 7.9 9.2 6.6 5.1 4.0 4.5  NEUTROABS 14.8* 7.4  --  6.0 4.4 3.2  --   HGB 10.6* 7.8* 7.9* 8.0* 8.0* 8.1* 8.0*  HCT 32.6* 23.9* 24.2* 24.9* 25.2* 25.3* 25.6*  MCV 66.1* 66.6* 65.4* 67.3* 66.7* 67.1* 67.5*  PLT 125* 69* 84* 73* 73* 77* 95*   Cardiac Enzymes:  Recent Labs Lab 11/13/13 0456  TROPONINI <0.30   BNP: No components found with this basename: POCBNP,  CBG: No results found for this basename: GLUCAP,  in the last 168 hours  Recent Results (from the past 240 hour(s))  URINE CULTURE     Status: None   Collection Time    11/11/13  9:57 PM      Result Value Ref Range Status   Specimen Description URINE, CATHETERIZED   Final   Special Requests NONE   Final   Culture  Setup Time     Final   Value: 11/12/2013 02:10     Performed at [place]     Final   Value: >=100,000 COLONIES/ML     Performed at [facility]   Culture     Final   Value: KLEBSIELLA PNEUMONIAE     Performed at [facility]   Report Status 11/14/2013 FINAL   Final   Organism ID, Bacteria KLEBSIELLA PNEUMONIAE   Final  CULTURE, BLOOD (ROUTINE X 2)     Status: None   Collection Time    11/13/13  6:10 AM      Result Value Ref Range Status   Specimen Description BLOOD RIGHT ANTECUBITAL   Final   Special Requests BOTTLES DRAWN AEROBIC AND ANAEROBIC 5CC EACH   Final   Culture  Setup Time     Final   Value: 11/13/2013 15:19     Performed at [facility]   Culture     Final   Value:        BLOOD CULTURE RECEIVED NO GROWTH TO DATE CULTURE WILL BE HELD FOR 5 DAYS BEFORE ISSUING A FINAL NEGATIVE REPORT     Performed at [facility]   Report Status PENDING   Incomplete  CULTURE, BLOOD (ROUTINE X 2)     Status: None   Collection Time    11/13/13  6:11 AM      Result Value  Ref Range Status   Specimen Description BLOOD LEFT ANTECUBITAL   Final   Special  Requests BOTTLES DRAWN AEROBIC AND ANAEROBIC [facility] EACH   Final   Culture  Setup Time     Final   Value: 11/13/2013 15:19     Performed at [facility]   Culture     Final   Value:        BLOOD CULTURE RECEIVED NO GROWTH TO DATE CULTURE WILL BE HELD FOR 5 DAYS BEFORE ISSUING A FINAL NEGATIVE REPORT     Performed at [facility]   Report Status PENDING   Incomplete  URINE CULTURE     Status: None   Collection Time    11/13/13  6:50 AM      Result Value Ref Range Status   Specimen Description URINE, CATHETERIZED   Final   Special Requests NONE   Final   Culture  Setup Time     Final   Value: 11/13/2013 15:40     Performed at [place]     Final   Value: NO GROWTH     Performed at [facility]   Culture     Final   Value: NO GROWTH     Performed at [facility]   Report Status 11/14/2013 FINAL   Final  MRSA PCR SCREENING     Status: None   Collection Time    11/13/13 10:05 AM      Result Value Ref Range Status   MRSA by PCR NEGATIVE  NEGATIVE Final   Comment:            The GeneXpert MRSA Assay (FDA     approved for NASAL specimens     only), is one component of a     comprehensive MRSA colonization     surveillance program. It is not     intended to diagnose MRSA     infection nor to guide or     monitor treatment for     MRSA infections.  CULTURE, EXPECTORATED SPUTUM-ASSESSMENT     Status: None   Collection Time    11/15/13 11:51 PM      Result Value Ref Range Status   Specimen Description SPUTUM   Final   Special Requests Normal   Final   Sputum evaluation     Final   Value: THIS SPECIMEN IS ACCEPTABLE. RESPIRATORY CULTURE REPORT TO FOLLOW.   Report Status 11/16/2013 FINAL   Final  CULTURE, RESPIRATORY (NON-EXPECTORATED)     Status: None   Collection Time    11/15/13 11:51 PM      Result Value Ref Range Status   Specimen Description SPUTUM   Final   Special Requests NONE   Final   Gram Stain     Final    Value: FEW WBC PRESENT, PREDOMINANTLY PMN     FEW SQUAMOUS EPITHELIAL CELLS PRESENT     RARE YEAST     Performed at [facility]   Culture     Final   Value: FEW CANDIDA ALBICANS     Performed at [facility]   Report Status 11/18/2013 FINAL   Final     Scheduled Meds: . antiseptic oral rinse  7 mL Mouth Rinse BID  . arformoterol  15 mcg Nebulization BID  . budesonide  0.5 mg Nebulization BID  . fluticasone  2 spray Each Nare BID  . ipratropium-albuterol  3 mL Nebulization  Q4H  . magic mouthwash  10 mL Oral QID  . pantoprazole (PROTONIX) IV  40 mg Intravenous Q12H  . piperacillin-tazobactam (ZOSYN)  IV  3.375 g Intravenous 3 times per day  . sodium chloride  2 spray Each Nare BID  . sodium chloride  3 mL Intravenous Q12H   Continuous Infusions: . dextrose 5 % and 0.45% NaCl 75 mL (11/19/13 0241)

## 2013-11-19 NOTE — Evaluation (Signed)
Physical Therapy Evaluation Patient Details Name: [name] [name] MRN: [id number] DOB: Sep 27, 1925 Today's Date: 11/19/2013   History of Present Illness  80 year old male with a history of sudden cardiac arrest status post AICD, ischemic cardiomyopathy (EF31% by MRI), COPD (seen in ED 10/16 for acute flare up but went home per his wish), thalassemia, presented with acute onset of abdominal pain on the morning of the admission, a/w some abdominal distention  Clinical Impression  Patient presents with decreased independence with mobility due to deficits listed in PT problem list.  He will benefit from skilled PT in the acute setting to allow return home with family/caregiver assist and HHPT.    Follow Up Recommendations Home health PT;Supervision/Assistance - 24 hour    Equipment Recommendations  None recommended by PT    Recommendations for Other Services       Precautions / Restrictions Precautions Precautions: Fall      Mobility  Bed Mobility Overal bed mobility: Needs Assistance Bed Mobility: Supine to Sit     Supine to sit: Min assist     General bed mobility comments: to lift trunk and line management  Transfers Overall transfer level: Needs assistance Equipment used: Rolling walker (2 wheeled) Transfers: Sit to/from Stand Sit to Stand: Min assist         General transfer comment: lifting help  Ambulation/Gait Ambulation/Gait assistance: Min assist Ambulation Distance (Feet): 15 Feet (x2) Assistive device: Rolling walker (2 wheeled) Gait Pattern/deviations: Step-through pattern;Decreased stride length;Trunk flexed;Shuffle     General Gait Details: very flexed posture, difficult due to lines and pt incontinent of stool in bathroom, so walked to and from bathroom.  Stairs            Wheelchair Mobility    Modified Rankin (Stroke Patients Only)       Balance Overall balance assessment: Needs assistance         Standing balance support:  Bilateral upper extremity supported Standing balance-Leahy Scale: Poor Standing balance comment: unsteady with UE support on walker due to posture and weakness                             Pertinent Vitals/Pain Pain Assessment: No/denies pain    Home Living Family/patient expects to be discharged to:: Private residence Living Arrangements: Spouse/significant other Available Help at Discharge: Personal care attendant;Available 24 hours/day;Available PRN/intermittently;Family Type of Home: House Home Access: Level entry     Home Layout: Two level;Able to live on main level with bedroom/bathroom Home Equipment: Gilford Rile - 2 wheels;Shower seat;Other (comment) (oxygen)      Prior Function Level of Independence: Independent         Comments: Son states they have aide about 5 hours a day, can increase to 24 hours     Hand Dominance        Extremity/Trunk Assessment               Lower Extremity Assessment: Generalized weakness      Cervical / Trunk Assessment: Kyphotic  Communication   Communication: HOH  Cognition Arousal/Alertness: Awake/alert Behavior During Therapy: WFL for tasks assessed/performed Overall Cognitive Status: Within Functional Limits for tasks assessed                      General Comments      Exercises        Assessment/Plan    PT Assessment Patient needs continued PT services  PT Diagnosis Generalized weakness;Abnormality  of gait   PT Problem List Decreased strength;Decreased activity tolerance;Decreased balance;Decreased knowledge of use of DME;Decreased mobility;Decreased safety awareness;Decreased knowledge of precautions  PT Treatment Interventions DME instruction;Gait training;Therapeutic exercise;Balance training;Stair training;Functional mobility training;Therapeutic activities;Patient/family education   PT Goals (Current goals can be found in the Care Plan section) Acute Rehab PT Goals Patient Stated Goal:  To return home, independent PT Goal Formulation: With patient/family Time For Goal Achievement: 12/03/13 Potential to Achieve Goals: Good    Frequency Min 3X/week   Barriers to discharge        Co-evaluation               End of Session Equipment Utilized During Treatment: Gait belt;Oxygen Activity Tolerance: Patient limited by fatigue Patient left: in chair;with call bell/phone within reach;with family/visitor present Nurse Communication: Mobility status         Time: 7915-0569 PT Time Calculation (min): 43 min   Charges:   PT Evaluation $Initial PT Evaluation Tier I: 1 Procedure PT Treatments $Gait Training: 8-22 mins $Therapeutic Activity: 8-22 mins   PT G Codes:          Kazumi Lachney,CYNDI 2013/12/18, 2:11 PM [place], [place] 18-Dec-2013

## 2013-11-19 NOTE — Plan of Care (Signed)
Problem: Phase II Progression Outcomes Goal: Progress activity as tolerated unless otherwise ordered Outcome: Progressing PT assisted pt to ambulate to bathroom.

## 2013-11-19 NOTE — Progress Notes (Signed)
IVT here to insert PICC.  After benefits and risks of PICC explained to pt by IVT, pt declined having PICC inserted. Brandon Wiechman, Beverly Gust, RN

## 2013-11-20 LAB — CBC
HEMATOCRIT: 23 % — AB (ref 39.0–52.0)
Hemoglobin: 7.3 g/dL — ABNORMAL LOW (ref 13.0–17.0)
MCH: 21.4 pg — AB (ref 26.0–34.0)
MCHC: 31.7 g/dL (ref 30.0–36.0)
MCV: 67.4 fL — AB (ref 78.0–100.0)
Platelets: 96 10*3/uL — ABNORMAL LOW (ref 150–400)
RBC: 3.41 MIL/uL — ABNORMAL LOW (ref 4.22–5.81)
RDW: 16.8 % — ABNORMAL HIGH (ref 11.5–15.5)
WBC: 5.1 10*3/uL (ref 4.0–10.5)

## 2013-11-20 LAB — BASIC METABOLIC PANEL
Anion gap: 12 (ref 5–15)
BUN: 30 mg/dL — AB (ref 6–23)
CALCIUM: 7.9 mg/dL — AB (ref 8.4–10.5)
CO2: 26 mEq/L (ref 19–32)
Chloride: 106 mEq/L (ref 96–112)
Creatinine, Ser: 1.46 mg/dL — ABNORMAL HIGH (ref 0.50–1.35)
GFR calc Af Amer: 48 mL/min — ABNORMAL LOW (ref >90)
GFR, EST NON AFRICAN AMERICAN: 42 mL/min — AB (ref >90)
GLUCOSE: 109 mg/dL — AB (ref 70–99)
Potassium: 3.4 mEq/L — ABNORMAL LOW (ref 3.7–5.3)
Sodium: 144 mEq/L (ref 137–147)

## 2013-11-20 LAB — MAGNESIUM
Magnesium: 2.3 mg/dL (ref 1.5–2.5)
Magnesium: 2.1 mg/dL (ref 1.5–2.5)

## 2013-11-20 LAB — PHOSPHORUS: Phosphorus: 3.4 mg/dL (ref 2.3–4.6)

## 2013-11-20 NOTE — Plan of Care (Signed)
Problem: Phase III Progression Outcomes Goal: Voiding independently Outcome: Not Progressing Foley cath per MD due to frequent I/O caths being done

## 2013-11-20 NOTE — Progress Notes (Signed)
Subjective: Pt tol FLD well.  No abd pain.  Objective: Vital signs in last 24 hours: Temp:  [97.3 F (36.3 C)-98.9 F (37.2 C)] 98.8 F (37.1 C) (10/25 0400) Pulse Rate:  [60-108] 67 (10/25 0600) Resp:  [20-32] 23 (10/25 0600) BP: (104-163)/(40-96) 121/43 mmHg (10/25 0600) SpO2:  [96 %-100 %] 98 % (10/25 0600) Weight:  [177 lb 0.5 oz (80.3 kg)] 177 lb 0.5 oz (80.3 kg) (10/25 0400) Last BM Date: 11/18/13  Intake/Output from previous day: 10/24 0701 - 10/25 0700 In: [place] [P.O.:720; I.V.:1007.5; IV Piggyback:112.5] Out: 3295 [Urine:1005; Emesis/NG output:650] Intake/Output this shift:    General appearance: alert and cooperative Resp: clear to auscultation bilaterally Cardio: regular rate and rhythm, S1, S2 normal, no murmur, click, rub or gallop GI: soft, non-tender; bowel sounds normal; no masses,  no organomegaly  Lab Results:   Recent Labs  11/19/13 0357  WBC 4.5  HGB 8.0*  HCT 25.6*  PLT 95*   BMET  Recent Labs  11/18/13 0536 11/19/13 0357  NA 151* 152*  K 4.0 3.8  CL 113* 113*  CO2 27 29  GLUCOSE 139* 114*  BUN 48* 39*  CREATININE 1.38* 1.43*  CALCIUM 8.6 8.3*   PT/INR No results found for this basename: LABPROT, INR,  in the last 72 hours ABG No results found for this basename: PHART, PCO2, PO2, HCO3,  in the last 72 hours  Studies/Results: Dg Chest Port 1 View  11/19/2013   CLINICAL DATA:  Acute respiratory failure. Coronary artery disease with cardiomyopathy. Diabetes. Emphysema.  EXAM: PORTABLE CHEST - 1 VIEW  COMPARISON:  11/16/2013  FINDINGS: The patient is rotated to the right on today's radiograph, reducing diagnostic sensitivity and specificity. Nasogastric tube enters the stomach. Dual lead pacer is in place.  Emphysema noted with scattered scarring and interstitial accentuation in the lungs. The interstitial accentuation and indistinctness of the pulmonary vasculature is mildly improved compared to prior. No new airspace opacity. No  pleural effusion identified.  Tortuous thoracic aorta.  IMPRESSION: 1. Emphysema with suspected low grade superimposed interstitial edema, although improved from 11/16/2013.   Electronically Signed   By: Sherryl Barters M.D.   On: 11/19/2013 08:23   Dg Abd Portable 1v  11/18/2013   CLINICAL DATA:  Small bowel obstruction; interval advancement of the nasogastric tube by 10 cm ; reassess nasogastric tube positioning  EXAM: PORTABLE ABDOMEN - 1 VIEW  COMPARISON:  Abdominal film of earlier today at 10:13 a.m.  FINDINGS: The tip of the nasogastric tube extends below the GE junction but is now is oriented retrograde toward the GE junction. The proximal port lies below the level of the GE junction.  IMPRESSION: The proximal port and tip of the esophagogastric tube now lie below the expected location of the GE junction although the tip of the tube is now directed toward the GE junction. Advancement by an additional 10 cm may allow the tube to proceed further into the gastric body.   Electronically Signed   By: David  Martinique   On: 11/18/2013 14:58   Dg Abd Portable 2v  11/19/2013   CLINICAL DATA:  Ileus, diverticulosis, subsequent encounter  EXAM: PORTABLE ABDOMEN - 2 VIEW  COMPARISON:  11/18/2013  FINDINGS: NG tube remains within the stomach. Scattered air throughout the bowel with mild distention but no significant obstruction pattern or ileus. No gross free air on the decubitus view. Chronic degenerative changes of the spine and pelvis. Diffuse patchy osteopenia as before. No significant interval change.  IMPRESSION: Nonspecific bowel gas pattern without obstruction. No gross free air.  Stable exam.   Electronically Signed   By: Daryll Brod M.D.   On: 11/19/2013 08:25   Dg Abd Portable 2v  11/18/2013   CLINICAL DATA:  Abdominal distention.  And ileus -obstruction  EXAM: PORTABLE ABDOMEN - 2 VIEW  COMPARISON:  Portable supine abdominal film of November 17, 2013  FINDINGS: There remain loops of mildly  distended gas-filled small bowel in the mid abdomen. No free extraluminal gas collections are demonstrated on the left side down decubitus film. No rectal gas is demonstrated. The esophagogastric tube tip and proximal port appear to lie just within the cardia. There is curvature of the mid lumbar spine convex toward the left.  IMPRESSION: 1. The bowel gas pattern is consistent with an ileus or partial mid to distal small bowel obstruction. There is no evidence of perforation. 2. Advancement of the nasogastric tube by approximately 10 cm is recommended to assure that the proximal port remains below the level of the GE junction.   Electronically Signed   By: David  Martinique   On: 11/18/2013 10:52    Anti-infectives: Anti-infectives   Start     Dose/Rate Route Frequency Ordered Stop   11/13/13 1400  piperacillin-tazobactam (ZOSYN) IVPB 3.375 g  Status:  Discontinued     3.375 g 100 mL/hr over 30 Minutes Intravenous 3 times per day 11/13/13 1035 11/13/13 1048   11/13/13 1400  piperacillin-tazobactam (ZOSYN) IVPB 3.375 g     3.375 g 12.5 mL/hr over 240 Minutes Intravenous 3 times per day 11/13/13 1052     11/13/13 0900  ertapenem (INVANZ) 1 g in sodium chloride 0.9 % 50 mL IVPB  Status:  Discontinued     1 g 100 mL/hr over 30 Minutes Intravenous Every 24 hours 11/13/13 0824 11/13/13 1112   11/13/13 0645  piperacillin-tazobactam (ZOSYN) IVPB 3.375 g     3.375 g 12.5 mL/hr over 240 Minutes Intravenous  Once 11/13/13 0636 11/13/13 0737      Assessment/Plan: Perforated diverticulitis Advance diet to soft con't Abx OOBTC   LOS: 7 days    Rosario Jacks., Anne Hahn 11/20/2013

## 2013-11-20 NOTE — Progress Notes (Addendum)
Patient ID: [name] [name], male   DOB: Sep 03, 1925, 80 y.o.   MRN: [id number] [facility] PROGRESS NOTE  Adrienne Delay WUJ:[id number] DOB: [birth year] DOA: 11/13/2013 PCP: [name], MD  Brief narrative: 80 year old male with a history of sudden cardiac arrest status post AICD, ischemic cardiomyopathy (EF31% by MRI), COPD (seen in ED 10/16 for acute flare up but went home per his wish), thalassemia who presented [facility] ED 11/13/2013 with acute onset of abdominal pain on the morning of the admission, a/w some abdominal distention. CT of the abdomen and pelvis in the ED revealed perforated diverticulitis with pneumoperitoneum. Per surgery, patient was determined to be high risk for surgery. Patient himself expressed he did not want to go through surgery although his daughters expressed they would want him to go through surgery if it necessary.   Assessment/Plan:  Principal Problem:  Sepsis, secondary to perforated sigmoid diverticulitis / peritonitis / ileus and/or partial small bowel obstruction  - Patient was admitted with abdominal distention and CT abdomen/pelvis showed acute sigmoid diverticulitis complicated by a free perforation. In addition patient had urine cultures growing Klebsiella pneumonia (done on previous visit 11/11/2013). Please note that repeat urine culture on this admission shows no growth. Patient remains hemodynamically stable. He has not required pressor support during this hospital stay. - Per surgery, given high risk for surgical intervention, recommendation was for medical management  - Patient was noted to have more abdominal distention on 11/17/2013. Abdominal x-ray revealed ileus versus partial small bowel obstruction. NG tube placed. Abdominal x-ray on 11/20/2103 showed nonspecific bowel gas pattern without obstruction. Abdomen less distended. Per surgery NG tube discontinued. Diet advanced to full liquid.   Active Problems:  Acute Respiratory Failure with  Hypoxia  - Secondary to COPD exacerbation and resp distress with underlying sepsis  - Steroids are contraindicated because of acute diverticulitis complicated by free perforation.  - Respiratory status is stable. Patient is saturating 98% with nasal cannula oxygen support, 2 L.  - Continue Brovana nebulizer treatment twice daily, Pulmicort nebulizer twice daily, duo neb nebulizer every 4 hours. Continue albuterol nebulizer every 2 hours as needed for shortness of breath or wheezing.  - Ativan 1 mg IV every 8 hours as needed for anxiety.  Ischemic cardiomyopathy with history of sudden cardiac death, s/p AICD  - Aspirin and Plavix still on hold because of high risk of bleeding.  - Continue he Trellis and 20 mg IV every 6 hours as needed for blood pressure control. Blood pressure is 151/64.  Klebsiella UTI  - Urine culture on 11/11/2013 grew Klebsiella pneumoniae. Sensitivity report confirmed sensitivity to Zosyn. Urine culture on this admission shows no growth.  Chronic Thrombocytopenia  - Likely due to thalassemia. Patient has scattered ecchymosis over upper extremities. No bleeding from mucosal surfaces.  - Platelet count is 95 on 11/19/2013. Follow up CBC this morning.  Acute on chronic blood loss anemia / anemia of chronic disease  - Mild drop in H&H noted. Hemoglobin was 8 on 11/19/2013. - Continue to monitor CBC daily. CBC this am is pending.  Acute on CKD stage 3  - Baseline creatinine of 1.6. Improved with hydration. Switched fluid to half normal given hypernatremia. This then changed to D5 on 11/19/2013 due to ongoing hypernatremia. Hypernatremia  - Secondary to dehydration. IV fluids changed to D5 instead of 1/2 NS due to ongoing hypernatremia. Severe PCM  - Poor PO intake in the context of acute illness. Now full liquid diet.  DVT Prophylaxis:  SCD's  Code Status: Full.  Family Communication: plan of care discussed with the patient and his family at the bedside  Disposition  Plan: transfer to telemetry floor today; RN aware of the transfer, order placed.  IV Access:   Peripheral IV Procedures and diagnostic studies:   Dg Abd 1 View 11/17/2013 NG tube in good position. Gaseous distention of the stomach has resolved. Bowel gas pattern most compatible with ileus. Electronically Signed By: Inge Rise M.D. On: 11/17/2013 09:54  Dg Chest Port 1 View 11/19/2013 1. Emphysema with suspected low grade superimposed interstitial edema, although improved from 11/16/2013. Electronically Signed By: Sherryl Barters M.D. On: 11/19/2013 08:23  Dg Abd Portable 1v 11/18/2013 The proximal port and tip of the esophagogastric tube now lie below the expected location of the GE junction although the tip of the tube is now directed toward the GE junction. Advancement by an additional 10 cm may allow the tube to proceed further into the gastric body. Electronically Signed By: David Martinique On: 11/18/2013 14:58  Dg Abd Portable 2v 11/19/2013 Nonspecific bowel gas pattern without obstruction. No gross free air. Stable exam. Electronically Signed By: Daryll Brod M.D. On: 11/19/2013 08:25  Dg Abd Portable 2v 11/18/2013 1. The bowel gas pattern is consistent with an ileus or partial mid to distal small bowel obstruction. There is no evidence of perforation. 2. Advancement of the nasogastric tube by approximately 10 cm is recommended to assure that the proximal port remains below the level of the GE junction. Electronically Signed By: David Martinique On: 11/18/2013 10:52  Medical Consultants:   [facility] Other Consultants:   PT  Anti-Infectives:   Zosyn 11/13/2013 -->    Leisa Lenz, MD  [facility] Pager [phone]  If 7PM-7AM, please contact night-coverage [email or web address] Password [email or web address] 11/20/2013, 7:21 AM   LOS: 7 days    HPI/Subjective: No acute overnight events.  Objective: Filed Vitals:   11/20/13 0200 11/20/13 0400 11/20/13 0410 11/20/13 0600  BP: 104/40  113/96  121/43  Pulse: 74 70  67  Temp:  98.8 F (37.1 C)    TempSrc:  Oral    Resp: 28 22  23   Height:      Weight:  80.3 kg (177 lb 0.5 oz)    SpO2: 97% 96% 97% 98%    Intake/Output Summary (Last 24 hours) at 11/20/13 0721 Last data filed at 11/20/13 0600  Gross per 24 hour  Intake   1840 ml  Output   1655 ml  Net    185 ml    Exam:   General:  Pt is alert, follows commands appropriately, not in acute distress  Cardiovascular: Regular rate and rhythm, S1/S2, no murmurs  Respiratory: Clear to auscultation bilaterally, no wheezing, no crackles, no rhonchi  Abdomen: less distended, non tender, bowel sounds present  Extremities: No edema, pulses DP and PT palpable bilaterally  Neuro: Grossly nonfocal  Data Reviewed: Basic Metabolic Panel:  Recent Labs Lab 11/15/13 0406 11/16/13 0320 11/17/13 0320 11/17/13 0330 11/18/13 0536 11/19/13 0357 11/20/13 0420  NA 145 150*  --  151* 151* 152*  --   K 4.3 4.6  --  4.4 4.0 3.8  --   CL 108 112  --  114* 113* 113*  --   CO2 22 22  --  24 27 29   --   GLUCOSE 114* 105*  --  96 139* 114*  --   BUN 67* 66*  --  61* 48* 39*  --   CREATININE  2.09* 1.76*  --  1.59* 1.38* 1.43*  --   CALCIUM 8.4 8.7  --  8.8 8.6 8.3*  --   MG 2.7* 2.9* 2.8*  --   --  2.4 2.3  PHOS 3.9 3.6 3.9  --   --  3.4 3.4   Liver Function Tests:  Recent Labs Lab 11/14/13 0340  AST 32  ALT 24  ALKPHOS 36*  BILITOT 0.5  PROT 6.1  ALBUMIN 3.0*   No results found for this basename: LIPASE, AMYLASE,  in the last 168 hours No results found for this basename: AMMONIA,  in the last 168 hours CBC:  Recent Labs Lab 11/14/13 0340 11/14/13 1213 11/15/13 0406 11/16/13 0320 11/17/13 0320 11/19/13 0357  WBC 7.9 9.2 6.6 5.1 4.0 4.5  NEUTROABS 7.4  --  6.0 4.4 3.2  --   HGB 7.8* 7.9* 8.0* 8.0* 8.1* 8.0*  HCT 23.9* 24.2* 24.9* 25.2* 25.3* 25.6*  MCV 66.6* 65.4* 67.3* 66.7* 67.1* 67.5*  PLT 69* 84* 73* 73* 77* 95*   Cardiac Enzymes: No results  found for this basename: CKTOTAL, CKMB, CKMBINDEX, TROPONINI,  in the last 168 hours BNP: No components found with this basename: POCBNP,  CBG: No results found for this basename: GLUCAP,  in the last 168 hours  URINE CULTURE     Status: None   Collection Time    11/11/13  9:57 PM      Result Value Ref Range Status   Specimen Description URINE, CATHETERIZED   Final   Value: KLEBSIELLA PNEUMONIAE     Performed at [facility]   Report Status 11/14/2013 FINAL   Final   Organism ID, Bacteria KLEBSIELLA PNEUMONIAE   Final  CULTURE, BLOOD (ROUTINE X 2)     Status: None   Collection Time    11/13/13  6:10 AM      Result Value Ref Range Status   Specimen Description BLOOD RIGHT ANTECUBITAL   Final   Value: NO GROWTH 5 DAYS     Performed at [facility]   Report Status 11/19/2013 FINAL   Final  CULTURE, BLOOD (ROUTINE X 2)     Status: None   Collection Time    11/13/13  6:11 AM      Result Value Ref Range Status   Specimen Description BLOOD LEFT ANTECUBITAL   Final   Value: NO GROWTH 5 DAYS     Performed at [facility]   Report Status 11/19/2013 FINAL   Final  URINE CULTURE     Status: None   Collection Time    11/13/13  6:50 AM      Result Value Ref Range Status   Specimen Description URINE, CATHETERIZED   Final   Value: NO GROWTH     Performed at [facility]   Report Status 11/14/2013 FINAL   Final  MRSA PCR SCREENING     Status: None   Collection Time    11/13/13 10:05 AM      Result Value Ref Range Status   MRSA by PCR NEGATIVE  NEGATIVE Final  CULTURE, EXPECTORATED SPUTUM-ASSESSMENT     Status: None   Collection Time    11/15/13 11:51 PM      Result Value Ref Range Status   Specimen Description SPUTUM   Final   Special Requests Normal   Final   Sputum evaluation     Final   Value: THIS SPECIMEN IS ACCEPTABLE. RESPIRATORY CULTURE REPORT TO FOLLOW.   Report  Status 11/16/2013 FINAL   Final  CULTURE, RESPIRATORY (NON-EXPECTORATED)      Status: None   Collection Time    11/15/13 11:51 PM      Result Value Ref Range Status   Specimen Description SPUTUM   Final   Value: FEW CANDIDA ALBICANS     Performed at [facility]   Report Status 11/18/2013 FINAL   Final     Scheduled Meds: . arformoterol  15 mcg Nebulization BID  . budesonide  0.5 mg Nebulization BID  . fluticasone  2 spray Each Nare BID  . ipratropium-albuterol  3 mL Nebulization Q4H  . magic mouthwash  10 mL Oral QID  . pantoprazole (PROTONIX) IV  40 mg Intravenous Q12H  . piperacillin-tazobactam (ZOSYN)  IV  3.375 g Intravenous 3 times per day   Continuous Infusions: . dextrose 50 mL (11/19/13 1051)

## 2013-11-20 NOTE — Progress Notes (Signed)
Pt did not get his 16:00 neb due to pt wants to eat. Pt will get next tx at 20:00. Pt in no distress at this time. Pt has PRN neb if needed.

## 2013-11-21 DIAGNOSIS — K5720 Diverticulitis of large intestine with perforation and abscess without bleeding: Secondary | ICD-10-CM | POA: Diagnosis present

## 2013-11-21 DIAGNOSIS — J449 Chronic obstructive pulmonary disease, unspecified: Secondary | ICD-10-CM

## 2013-11-21 DIAGNOSIS — J441 Chronic obstructive pulmonary disease with (acute) exacerbation: Secondary | ICD-10-CM | POA: Diagnosis present

## 2013-11-21 DIAGNOSIS — A419 Sepsis, unspecified organism: Secondary | ICD-10-CM | POA: Diagnosis present

## 2013-11-21 DIAGNOSIS — R6520 Severe sepsis without septic shock: Secondary | ICD-10-CM

## 2013-11-21 DIAGNOSIS — A499 Bacterial infection, unspecified: Secondary | ICD-10-CM | POA: Diagnosis present

## 2013-11-21 DIAGNOSIS — N390 Urinary tract infection, site not specified: Secondary | ICD-10-CM

## 2013-11-21 DIAGNOSIS — K560 Paralytic ileus: Secondary | ICD-10-CM | POA: Diagnosis not present

## 2013-11-21 DIAGNOSIS — E43 Unspecified severe protein-calorie malnutrition: Secondary | ICD-10-CM | POA: Diagnosis present

## 2013-11-21 LAB — BASIC METABOLIC PANEL
ANION GAP: 11 (ref 5–15)
BUN: 28 mg/dL — ABNORMAL HIGH (ref 6–23)
CALCIUM: 7.9 mg/dL — AB (ref 8.4–10.5)
CO2: 27 mEq/L (ref 19–32)
Chloride: 106 mEq/L (ref 96–112)
Creatinine, Ser: 1.65 mg/dL — ABNORMAL HIGH (ref 0.50–1.35)
GFR, EST AFRICAN AMERICAN: 42 mL/min — AB (ref >90)
GFR, EST NON AFRICAN AMERICAN: 36 mL/min — AB (ref >90)
Glucose, Bld: 108 mg/dL — ABNORMAL HIGH (ref 70–99)
POTASSIUM: 3.6 meq/L — AB (ref 3.7–5.3)
SODIUM: 144 meq/L (ref 137–147)

## 2013-11-21 LAB — CBC
HCT: 24.1 % — ABNORMAL LOW (ref 39.0–52.0)
HEMOGLOBIN: 7.9 g/dL — AB (ref 13.0–17.0)
MCH: 21.7 pg — AB (ref 26.0–34.0)
MCHC: 32.8 g/dL (ref 30.0–36.0)
MCV: 66.2 fL — ABNORMAL LOW (ref 78.0–100.0)
PLATELETS: 83 10*3/uL — AB (ref 150–400)
RBC: 3.64 MIL/uL — ABNORMAL LOW (ref 4.22–5.81)
RDW: 16.6 % — ABNORMAL HIGH (ref 11.5–15.5)
WBC: 5.7 10*3/uL (ref 4.0–10.5)

## 2013-11-21 LAB — MAGNESIUM: MAGNESIUM: 2.1 mg/dL (ref 1.5–2.5)

## 2013-11-21 LAB — PHOSPHORUS: PHOSPHORUS: 3 mg/dL (ref 2.3–4.6)

## 2013-11-21 MED ORDER — CIPROFLOXACIN HCL 500 MG PO TABS: 500.0000 mg | ORAL_TABLET | Freq: Two times a day (BID) | ORAL | Status: AC

## 2013-11-21 MED ORDER — METRONIDAZOLE 500 MG PO TABS: 500.0000 mg | ORAL_TABLET | Freq: Three times a day (TID) | ORAL | Status: AC

## 2013-11-21 MED ORDER — ASPIRIN EC 81 MG PO TBEC: 81.0000 mg | DELAYED_RELEASE_TABLET | Freq: Every day | ORAL | Status: DC

## 2013-11-21 MED ORDER — POTASSIUM CHLORIDE CRYS ER 20 MEQ PO TBCR
40.0000 meq | EXTENDED_RELEASE_TABLET | Freq: Once | ORAL | Status: AC
  Administered 2013-11-21: 40 meq via ORAL
  Filled 2013-11-21: qty 2

## 2013-11-21 MED ORDER — CLOPIDOGREL BISULFATE 75 MG PO TABS: 75.0000 mg | ORAL_TABLET | ORAL | Status: DC

## 2013-11-21 MED ORDER — METRONIDAZOLE 500 MG PO TABS
500.0000 mg | ORAL_TABLET | Freq: Three times a day (TID) | ORAL | Status: DC
  Administered 2013-11-21 – 2013-11-22 (×3): 500 mg via ORAL
  Filled 2013-11-21 (×4): qty 1

## 2013-11-21 MED ORDER — CIPROFLOXACIN HCL 500 MG PO TABS
500.0000 mg | ORAL_TABLET | Freq: Two times a day (BID) | ORAL | Status: DC
  Administered 2013-11-21 – 2013-11-22 (×2): 500 mg via ORAL
  Filled 2013-11-21 (×2): qty 1

## 2013-11-21 MED ORDER — HYDROCODONE-ACETAMINOPHEN 2.5-500 MG PO TABS: 1.0000 | ORAL_TABLET | Freq: Four times a day (QID) | ORAL | Status: DC | PRN

## 2013-11-21 NOTE — Progress Notes (Signed)
Received report from [place], Therapist, sports. Agree with am assessment. Will continue to monitor pt.

## 2013-11-21 NOTE — Discharge Summary (Addendum)
Physician Discharge Summary  [name] [name] UYQ:[id number] DOB: [birth year] DOA: 11/13/2013  PCP: [name], MD  Admit date: 11/13/2013 Discharge date: 11/22/2013  Time spent: 35 minutes  Recommendations for Outpatient Follow-up:  1. discharge home with HHPT  Discharge Diagnoses:   Principle problem:   Severe sepsis  Active Problems:   Ischemic cardiomyopathy   Acute on chronic kidney disease stage 3   Thrombocytopenia   Beta thalassemia trait   Acute respiratory failure   Preoperative respiratory examination   Hypernatremia   COPD (chronic obstructive pulmonary disease) exacerbation   Diverticulitis of colon with perforation Klebsiella UTI Protein calorie malnutrtion    Discharge Condition: fair  Diet recommendation: cardiac  Filed Weights   11/19/13 0400 11/20/13 0400 11/21/13 0433  Weight: 81 kg (178 lb 9.2 oz) 80.3 kg (177 lb 0.5 oz) 79.8 kg (175 lb 14.8 oz)    History of present illness:  80 year old male with a history of sudden cardiac arrest status post AICD, ischemic cardiomyopathy (EF31% by MRI), COPD (seen in ED 10/16 for acute flare up but went home per his wish), thalassemia who presented to [facility] ED 11/13/2013 with acute onset of abdominal pain on the morning of the admission, a/w some abdominal distention. CT of the abdomen and pelvis in the ED revealed perforated diverticulitis with pneumoperitoneum. Per surgery, patient was determined to be high risk for surgery. Patient himself expressed he did not want to go through surgery although his daughters expressed they would want him to go through surgery if it necessary.  Patient admitted to stepdown for further management . hospital course prolonged due to ongoing sepsis , ileus and respiratory failure.    Hospital Course:  Principal Problem:  Sepsis, secondary to perforated sigmoid diverticulitis / peritonitis / ileus and/or partial small bowel obstruction  - Patient was admitted with abdominal  distention and CT abdomen/pelvis showed acute sigmoid diverticulitis complicated by a free perforation. In addition patient had urine cultures growing Klebsiella pneumonia (done on previous visit 11/11/2013). Please note that repeat urine culture on this admission shows no growth. Patient remains hemodynamically stable. He has not required pressor support during this hospital stay.  - Per surgery, given high risk for surgical intervention, recommendation was for medical management  - Patient was noted to have more abdominal distention on 11/17/2013. Abdominal x-ray revealed ileus versus partial small bowel obstruction. NG tube placed. Abdominal x-ray on 11/20/2103 showed nonspecific bowel gas pattern without obstruction. Since abdominal distention started to improve, NG was removed.  Diet advanced and tolerating well. -patient clinically improving. Was on empiric zosyn, now transitioned to oral ciprofloxacin and flagyl to complete a 14 day course of abx upon discharge. .   Active Problems:  Acute Respiratory Failure with Hypoxia  - Secondary to COPD exacerbation and resp distress with underlying sepsis  - Steroids  contraindicated because of acute diverticulitis complicated by free perforation.  - Respiratory status is stable. Patient is saturating >90% on RA although gets dyspnic on ambulation. -resume home inhaler and neb upon discharge. contieu nighttime o2. Follow up with Dr Melvyn Novas in 3 weeks. Surgery and PCCM signed off and appreciate recommendations.   Ischemic cardiomyopathy with history of sudden cardiac death, s/p AICD  - Aspirin and Plavix still on hold because of high risk of bleeding. Can be resumed after 2 weeks as ouitpt if stable. Resume home BP meds  Klebsiella UTI  - Urine culture on 11/11/2013 grew Klebsiella pneumoniae. Sensitivity report confirmed sensitivity to Zosyn. Urine culture on this admission shows no  growth.  Ischemic cardiomyopathy/ CAD Stable and  euvolemic.  continue home meds except for ASA and plavix held to avoid bleeding while pt septic. May resume after completing 2 weeks fo abx without any further issues.    Chronic Thrombocytopenia  - Likely due to thalassemia. Patient has scattered ecchymosis over upper extremities. No bleeding from mucosal surfaces.   Acute on chronic blood loss anemia / anemia of chronic disease  - Mild drop in H&H noted. Hemoglobin 7.9 today. Follow as outpt  Acute on CKD stage 3  - Baseline creatinine of 1.6. Improved with hydration.   Hypernatremia  - Secondary to dehydration. IV fluids changed to D5 and improved this am  Severe Protein calorie malnutrition - Poor PO intake in the context of acute illness. Now on soft diet.  generalized weakness  seen by PT recommend home with HHPT  and 24 hr supervision. Ambulate with rolling walker.  diabetes mellitus  stable Not on any meds    Consult: PCCM [facility]  Diabetic: heart healthy/ diabetic   Code Status: Full.   Family Communication: d/w daughter stephanie on the phone  Disposition Plan: D/c home with outpt follow up. HHPT arranged     Discharge Exam: Filed Vitals:   11/21/13 1401  BP: 112/59  Pulse: 77  Temp: 98.1 F (36.7 C)  Resp: 20    General: elderly male in NAD HEENT: moist oral mucosa Cardiovascular:  S1/S2 normal , no murmurs  Respiratory: Clear to auscultation bilaterally, no wheezing, no crackles, no rhonchi  Abdomen: soft, non distended, non tender, bowel sounds present  Extremities: No edema,  Neuro: alert and oriented,   Discharge Instructions You were cared for by a hospitalist during your hospital stay. If you have any questions about your discharge medications or the care you received while you were in the hospital after you are discharged, you can call the unit and asked to speak with the hospitalist on call if the hospitalist that took care of you is not available. Once you are discharged, your primary care  physician will handle any further medical issues. Please note that NO REFILLS for any discharge medications will be authorized once you are discharged, as it is imperative that you return to your primary care physician (or establish a relationship with a primary care physician if you do not have one) for your aftercare needs so that they can reassess your need for medications and monitor your lab values.   Current Discharge Medication List    START taking these medications   Details  HYDROcodone-acetaminophen (VICODIN) 2.5-500 MG per tablet Take 1 tablet by mouth every 6 (six) hours as needed for pain. Qty: 30 tablet, Refills: 0    metroNIDAZOLE (FLAGYL) 500 MG tablet Take 1 tablet (500 mg total) by mouth every 8 (eight) hours. Qty: 18 tablet, Refills: 0 until 11/1/ 2015      CONTINUE these medications which have CHANGED   Details  aspirin EC 81 MG tablet Take 1 tablet (81 mg total) by mouth at bedtime. Qty: 30 tablet, Refills: 0 from 11/28/2013    ciprofloxacin (CIPRO) 500 MG tablet Take 1 tablet (500 mg total) by mouth 2 (two) times daily. Qty: 12 tablet, Refills: 0 UNTIL 11/27/2013    clopidogrel (PLAVIX) 75 MG tablet Take 1 tablet (75 mg total) by mouth every morning. Qty: 30 tablet, Refills: 0 until 11/28/2013      CONTINUE these medications which have NOT CHANGED   Details  albuterol (PROVENTIL) (5 MG/ML) 0.5%  nebulizer solution Take 2.5 mg by nebulization every 6 (six) hours as needed for wheezing or shortness of breath.    ALPRAZolam (XANAX) 0.5 MG tablet Take 0.5 mg by mouth at bedtime as needed for sleep.     budesonide (PULMICORT) 0.5 MG/2ML nebulizer solution Take 2 mLs (0.5 mg total) by nebulization 2 (two) times daily. Qty: 120 mL, Refills: 3    famotidine (PEPCID) 20 MG tablet Take 20 mg by mouth at bedtime.     losartan (COZAAR) 25 MG tablet Take 25 mg by mouth daily.    metoprolol tartrate (LOPRESSOR) 25 MG tablet Take 25 mg by mouth 2 (two) times daily.     senna (SENOKOT) 8.6 MG TABS tablet Take 1 tablet by mouth daily.    tamsulosin (FLOMAX) 0.4 MG CAPS capsule Take 1 capsule (0.4 mg total) by mouth daily. Qty: 30 capsule, Refills: 3    arformoterol (BROVANA) 15 MCG/2ML NEBU Take 2 mLs (15 mcg total) by nebulization 2 (two) times daily. Qty: 120 mL, Refills: 3    nitroGLYCERIN (NITROSTAT) 0.4 MG SL tablet Place 1 tablet (0.4 mg total) under the tongue every 5 (five) minutes as needed for chest pain. Qty: 25 tablet, Refills: 4   Associated Diagnoses: Coronary atherosclerosis of unspecified type of vessel, native or graft      STOP taking these medications     predniSONE (DELTASONE) 20 MG tablet        No Known Allergies    The results of significant diagnostics from this hospitalization (including imaging, microbiology, ancillary and laboratory) are listed below for reference.    Significant Diagnostic Studies: Ct Abdomen Pelvis Wo Contrast  11/13/2013   CLINICAL DATA:  Abdominal pain and distention.  Initial encounter  EXAM: CT ABDOMEN AND PELVIS WITHOUT CONTRAST  TECHNIQUE: Multidetector CT imaging of the abdomen and pelvis was performed following the standard protocol without IV contrast.  COMPARISON:  07/03/2013  FINDINGS: BODY WALL: Unremarkable.  LOWER CHEST: Pacer leads are present, but only partially visualized.  ABDOMEN/PELVIS:  Liver: No focal abnormality.  Biliary: Cholelithiasis.  No acute cholecystitis identified.  Pancreas: Unremarkable.  Spleen: Unremarkable.  Adrenals: Unremarkable.  Kidneys and ureters: Bilateral renal masses of varying density are stable compared to recent imaging. Most of these are simple or hemorrhagic cyst based on renal ultrasound 07/03/2013, although further characterization was recommended for a septated or nodular cyst. The previously noted right ureteral calculus has passed. No hydronephrosis.  Bladder: Unremarkable.  Reproductive: Enlarged prostate, deforming the bladder base.  Bowel:  Inflammation surrounds a focally thickened sigmoid diverticulum consistent with diverticulitis. The diverticulitis is complicated by free perforation with scattered pneumoperitoneum throughout the anti dependent abdomen. There is no evidence for fluid collection. There is close proximity between the diverticulitis and the posterior bladder. No pneumaturia or bladder wall thickening to suggest fistulization has occurred. No bowel obstruction. Negative appendix.  Retroperitoneum: No mass or adenopathy.  Peritoneum: Small to moderate pneumoperitoneum as above.  Vascular: No acute abnormality.  OSSEOUS: Osteopenia with numerous tiny lucencies throughout the imaged skeleton. No acute fracture.  Critical Value/emergent results were called by telephone at the time of interpretation on 11/13/2013 at 6:56 am to Dr. Shanon Rosser , who verbally acknowledged these results.  IMPRESSION: 1. Sigmoid diverticulitis complicated by free perforation. Pneumoperitoneum is small to moderate volume. 2. Bilateral complex renal cysts, reference imaging recommendations from 07/03/2013. 3. Tiny lucencies throughout the skeleton is likely from the patient's significant osteopenia. Myeloma is the main differential consideration, after convalescence consider  serum electrophoresis. 4. Cholelithiasis.   Electronically Signed   By: Jorje Guild M.D.   On: 11/13/2013 07:00   Dg Chest 2 View  11/11/2013   CLINICAL DATA:  Initial evaluation for weakness, increased shortness of breath, current history of urinary tract infection, testicular pain, history of coronary artery disease congestive heart failure diabetes myocardial infarction, former smoker  EXAM: CHEST  2 VIEW  COMPARISON:  07/01/2013  FINDINGS: Hyperinflation consistent with COPD. Stable cardiac pacer. Uncoiling of the aorta. Vascular pattern normal. No consolidation or effusion. No significant change when compared to the prior study.  IMPRESSION: COPD with no acute findings    Electronically Signed   By: Skipper Cliche M.D.   On: 11/11/2013 21:57   Dg Abd 1 View  11/17/2013   CLINICAL DATA:  NG tube placement.  EXAM: ABDOMEN - 1 VIEW  COMPARISON:  Two views of the abdomen 11/17/2013.  FINDINGS: NG tube is in place with the tip in the fundus of the stomach. Gaseous distention of the stomach has resolved. There is persistent gaseous distention of small and large bowel. No new abnormality is identified.  IMPRESSION: NG tube in good position. Gaseous distention of the stomach has resolved.  Bowel gas pattern most compatible with ileus.   Electronically Signed   By: Inge Rise M.D.   On: 11/17/2013 09:54   Dg Chest Port 1 View  11/19/2013   CLINICAL DATA:  Acute respiratory failure. Coronary artery disease with cardiomyopathy. Diabetes. Emphysema.  EXAM: PORTABLE CHEST - 1 VIEW  COMPARISON:  11/16/2013  FINDINGS: The patient is rotated to the right on today's radiograph, reducing diagnostic sensitivity and specificity. Nasogastric tube enters the stomach. Dual lead pacer is in place.  Emphysema noted with scattered scarring and interstitial accentuation in the lungs. The interstitial accentuation and indistinctness of the pulmonary vasculature is mildly improved compared to prior. No new airspace opacity. No pleural effusion identified.  Tortuous thoracic aorta.  IMPRESSION: 1. Emphysema with suspected low grade superimposed interstitial edema, although improved from 11/16/2013.   Electronically Signed   By: Sherryl Barters M.D.   On: 11/19/2013 08:23   Dg Chest Port 1 View  11/16/2013   CLINICAL DATA:  80 year old with hypoxia and respiratory distress in this setting of COPD. History of cardiomyopathy.  EXAM: PORTABLE CHEST - 1 VIEW  COMPARISON:  11/13/2013  FINDINGS: Left cardiac ICD appears stable in position. Heart size is stable and within normal limits. Evidence for underlying hyperinflation. There is no focal airspace disease. The thoracic aorta is prominent and  similar to the previous examination. No evidence for a pneumothorax. The left costophrenic angle is incompletely imaged.  IMPRESSION: No acute chest findings.   Electronically Signed   By: Markus Daft M.D.   On: 11/16/2013 07:54   Dg Chest Port 1 View  11/13/2013   CLINICAL DATA:  Abdominal pain beginning after dinner. Acute onset shortness of breath while in triage.  EXAM: PORTABLE CHEST - 1 VIEW  COMPARISON:  Chest radiograph November 11, 2013  FINDINGS: Cardiac silhouette is nonenlarged, similar appearing tortuous mildly calcified aorta. No pleural effusions or focal consolidations. Slightly increased lung volumes may reflect COPD. Dual lead LEFT cardiac defibrillator in situ. No pneumothorax. Soft tissue planes and included osseous structures are nonsuspicious.  IMPRESSION: No acute cardiopulmonary process ; stable appearance of the chest from November 11, 2013.   Electronically Signed   By: Elon Alas   On: 11/13/2013 05:02   Dg Abd Portable 1v  11/18/2013   CLINICAL DATA:  Small bowel obstruction; interval advancement of the nasogastric tube by 10 cm ; reassess nasogastric tube positioning  EXAM: PORTABLE ABDOMEN - 1 VIEW  COMPARISON:  Abdominal film of earlier today at 10:13 a.m.  FINDINGS: The tip of the nasogastric tube extends below the GE junction but is now is oriented retrograde toward the GE junction. The proximal port lies below the level of the GE junction.  IMPRESSION: The proximal port and tip of the esophagogastric tube now lie below the expected location of the GE junction although the tip of the tube is now directed toward the GE junction. Advancement by an additional 10 cm may allow the tube to proceed further into the gastric body.   Electronically Signed   By: David  Martinique   On: 11/18/2013 14:58   Dg Abd Portable 1v  11/13/2013   CLINICAL DATA:  Abdominal distention.  Initial encounter  EXAM: PORTABLE ABDOMEN - 1 VIEW  COMPARISON:  07/13/2013  FINDINGS: No evidence of bowel  obstruction. Stool volume is within normal limits. No concerning intra-abdominal mass effect or calcification. Visualized lung bases are grossly clear. Lumbar levoscoliosis with advanced degenerative disc disease. Patchy density of the pelvis is not a reliable finding given blurring artifact noted on both images.  IMPRESSION: Negative for bowel obstruction.   Electronically Signed   By: Jorje Guild M.D.   On: 11/13/2013 05:06   Dg Abd Portable 2v  11/19/2013   CLINICAL DATA:  Ileus, diverticulosis, subsequent encounter  EXAM: PORTABLE ABDOMEN - 2 VIEW  COMPARISON:  11/18/2013  FINDINGS: NG tube remains within the stomach. Scattered air throughout the bowel with mild distention but no significant obstruction pattern or ileus. No gross free air on the decubitus view. Chronic degenerative changes of the spine and pelvis. Diffuse patchy osteopenia as before. No significant interval change.  IMPRESSION: Nonspecific bowel gas pattern without obstruction. No gross free air.  Stable exam.   Electronically Signed   By: Daryll Brod M.D.   On: 11/19/2013 08:25   Dg Abd Portable 2v  11/18/2013   CLINICAL DATA:  Abdominal distention.  And ileus -obstruction  EXAM: PORTABLE ABDOMEN - 2 VIEW  COMPARISON:  Portable supine abdominal film of November 17, 2013  FINDINGS: There remain loops of mildly distended gas-filled small bowel in the mid abdomen. No free extraluminal gas collections are demonstrated on the left side down decubitus film. No rectal gas is demonstrated. The esophagogastric tube tip and proximal port appear to lie just within the cardia. There is curvature of the mid lumbar spine convex toward the left.  IMPRESSION: 1. The bowel gas pattern is consistent with an ileus or partial mid to distal small bowel obstruction. There is no evidence of perforation. 2. Advancement of the nasogastric tube by approximately 10 cm is recommended to assure that the proximal port remains below the level of the GE junction.    Electronically Signed   By: David  [place]   On: 11/18/2013 10:52   Dg Abd Portable 2v  11/17/2013   CLINICAL DATA:  History of bowel perforation  EXAM: PORTABLE ABDOMEN - 2 VIEW  COMPARISON:  Portable abdominal films of November 16, 2013  FINDINGS: The stomach is distended with gas. There are a few loops of gas-filled bowel in the lower abdomen. No definite extraluminal gas collections are demonstrated. There are degenerative changes of the lumbar spine. The lung bases are clear.  IMPRESSION: There is persistent gaseous distention of the stomach as  well as mild small bowel ileus pattern. Nasogastric suction may be useful. No free extraluminal gas collections are demonstrated today.   Electronically Signed   By: David  [place]   On: 11/17/2013 07:41   Dg Abd Portable 2v  11/16/2013   CLINICAL DATA:  Abdominal distention. Recent CT demonstrated sigmoid diverticulitis complicated by perforation and pneumoperitoneum.  EXAM: PORTABLE ABDOMEN - 2 VIEW  COMPARISON:  CT, 11/13/2013.  FINDINGS: The small amount of free intraperitoneal air noted on CT is not visible radiographically.  Stomach is distended. There is mild dilation of small bowel with multiple air-fluid levels, findings consistent with a diffuse adynamic ileus.  There are advanced degenerative changes throughout the visualized spine.  IMPRESSION: 1. The small amount of free air seen on the recent prior CT is not evident radiographically. 2. Gastric distention and mild small bowel dilation with associated air-fluid levels the most consistent with a diffuse adynamic ileus.   Electronically Signed   By: Lajean Manes M.D.   On: 11/16/2013 11:34    Microbiology: Recent Results (from the past 240 hour(s))  URINE CULTURE     Status: None   Collection Time    11/11/13  9:57 PM      Result Value Ref Range Status   Specimen Description URINE, CATHETERIZED   Final   Special Requests NONE   Final   Culture  Setup Time     Final   Value: 11/12/2013 02:10      Performed at [place]     Final   Value: >=100,000 COLONIES/ML     Performed at [facility]   Culture     Final   Value: KLEBSIELLA PNEUMONIAE     Performed at [facility]   Report Status 11/14/2013 FINAL   Final   Organism ID, Bacteria KLEBSIELLA PNEUMONIAE   Final  CULTURE, BLOOD (ROUTINE X 2)     Status: None   Collection Time    11/13/13  6:10 AM      Result Value Ref Range Status   Specimen Description BLOOD RIGHT ANTECUBITAL   Final   Special Requests BOTTLES DRAWN AEROBIC AND ANAEROBIC 5CC EACH   Final   Culture  Setup Time     Final   Value: 11/13/2013 15:19     Performed at [facility]   Culture     Final   Value: NO GROWTH 5 DAYS     Performed at [facility]   Report Status 11/19/2013 FINAL   Final  CULTURE, BLOOD (ROUTINE X 2)     Status: None   Collection Time    11/13/13  6:11 AM      Result Value Ref Range Status   Specimen Description BLOOD LEFT ANTECUBITAL   Final   Special Requests BOTTLES DRAWN AEROBIC AND ANAEROBIC [facility] EACH   Final   Culture  Setup Time     Final   Value: 11/13/2013 15:19     Performed at [facility]   Culture     Final   Value: NO GROWTH 5 DAYS     Performed at [facility]   Report Status 11/19/2013 FINAL   Final  URINE CULTURE     Status: None   Collection Time    11/13/13  6:50 AM      Result Value Ref Range Status   Specimen Description URINE, CATHETERIZED   Final   Special Requests NONE   Final  Culture  Setup Time     Final   Value: 11/13/2013 15:40     Performed at [place]     Final   Value: NO GROWTH     Performed at [facility]   Culture     Final   Value: NO GROWTH     Performed at [facility]   Report Status 11/14/2013 FINAL   Final  MRSA PCR SCREENING     Status: None   Collection Time    11/13/13 10:05 AM      Result Value Ref Range Status   MRSA by PCR NEGATIVE  NEGATIVE Final    Comment:            The GeneXpert MRSA Assay (FDA     approved for NASAL specimens     only), is one component of a     comprehensive MRSA colonization     surveillance program. It is not     intended to diagnose MRSA     infection nor to guide or     monitor treatment for     MRSA infections.  CULTURE, EXPECTORATED SPUTUM-ASSESSMENT     Status: None   Collection Time    11/15/13 11:51 PM      Result Value Ref Range Status   Specimen Description SPUTUM   Final   Special Requests Normal   Final   Sputum evaluation     Final   Value: THIS SPECIMEN IS ACCEPTABLE. RESPIRATORY CULTURE REPORT TO FOLLOW.   Report Status 11/16/2013 FINAL   Final  CULTURE, RESPIRATORY (NON-EXPECTORATED)     Status: None   Collection Time    11/15/13 11:51 PM      Result Value Ref Range Status   Specimen Description SPUTUM   Final   Special Requests NONE   Final   Gram Stain     Final   Value: FEW WBC PRESENT, PREDOMINANTLY PMN     FEW SQUAMOUS EPITHELIAL CELLS PRESENT     RARE YEAST     Performed at [facility]   Culture     Final   Value: FEW CANDIDA ALBICANS     Performed at [facility]   Report Status 11/18/2013 FINAL   Final     Labs: Basic Metabolic Panel:  Recent Labs Lab 11/16/13 0320 11/17/13 0320 11/17/13 0330 11/18/13 0536 11/19/13 0357 11/20/13 0420 11/20/13 0837 11/21/13 0445  NA 150*  --  151* 151* 152*  --  144 144  K 4.6  --  4.4 4.0 3.8  --  3.4* 3.6*  CL 112  --  114* 113* 113*  --  106 106  CO2 22  --  24 27 29   --  26 27  GLUCOSE 105*  --  96 139* 114*  --  109* 108*  BUN 66*  --  61* 48* 39*  --  30* 28*  CREATININE 1.76*  --  1.59* 1.38* 1.43*  --  1.46* 1.65*  CALCIUM 8.7  --  8.8 8.6 8.3*  --  7.9* 7.9*  MG 2.9* 2.8*  --   --  2.4 2.3 2.1 2.1  PHOS 3.6 3.9  --   --  3.4 3.4  --  3.0   Liver Function Tests: No results found for this basename: AST, ALT, ALKPHOS, BILITOT, PROT, ALBUMIN,  in the last 168 hours No results found for this  basename: LIPASE, AMYLASE,  in the  last 168 hours No results found for this basename: AMMONIA,  in the last 168 hours CBC:  Recent Labs Lab 11/15/13 0406 11/16/13 0320 11/17/13 0320 11/19/13 0357 11/20/13 0837 11/21/13 0445  WBC 6.6 5.1 4.0 4.5 5.1 5.7  NEUTROABS 6.0 4.4 3.2  --   --   --   HGB 8.0* 8.0* 8.1* 8.0* 7.3* 7.9*  HCT 24.9* 25.2* 25.3* 25.6* 23.0* 24.1*  MCV 67.3* 66.7* 67.1* 67.5* 67.4* 66.2*  PLT 73* 73* 77* 95* 96* 83*   Cardiac Enzymes: No results found for this basename: CKTOTAL, CKMB, CKMBINDEX, TROPONINI,  in the last 168 hours BNP: BNP (last 3 results)  Recent Labs  07/01/13 0625 11/11/13 2139  PROBNP 330.2 759.3*   CBG: No results found for this basename: GLUCAP,  in the last 168 hours     Signed:  Louellen Molder  [facility] 11/21/2013, 5:43 PM

## 2013-11-21 NOTE — Progress Notes (Signed)
PULMONARY / CRITICAL CARE MEDICINE   Name: [name] [name] MRN: [id number] DOB: 27-Aug-1925    ADMISSION DATE:  11/13/2013 CONSULTATION DATE:  11/13/2013   REFERRING MD :  Dr Shanon Brow [facility]  CHIEF COMPLAINT:  Respiratory Distress in setting of COPD,  Cardiomyopathy, and GI perf. 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 Noted to have pneumoperitoneum due to sigmoid micro-perforation, managed conservatively. PCCM consulted regarding COPD and an episode acute resp distres.     STUDIES /EVENTS 11/13/2013 - admit 10/20 looks better, less distressed.  10/22 NGT placed per [facility]   SUBJECTIVE/OVERNIGHT/INTERVAL HX Denies dyspnea, states breathing at rest is near baseline, still very SOB w exertion  \VITAL SIGNS: Temp:  [97.4 F (36.3 C)-98.5 F (36.9 C)] 98.1 F (36.7 C) (10/26 0433) Pulse Rate:  [70-80] 80 (10/26 0950) Resp:  [21-24] 21 (10/26 0433) BP: (108-153)/(45-90) 153/82 mmHg (10/26 0950) SpO2:  [90 %-100 %] 90 % (10/26 0950) Weight:  [79.8 kg (175 lb 14.8 oz)] 79.8 kg (175 lb 14.8 oz) (10/26 0433) HEMODYNAMICS:   VENTILATOR SETTINGS:   INTAKE / OUTPUT:  Intake/Output Summary (Last 24 hours) at 11/21/13 1017 Last data filed at 11/21/13 0738  Gross per 24 hour  Intake   1540 ml  Output    950 ml  Net    590 ml    PHYSICAL EXAMINATION: General:  Elderly male. No distress Neuro:  A.xo3. Non focal HEENT: Op clear, no lesions Cardiovascular: regular, no M Lungs:  B soft scattered end-exp wheezes, likely baseline Musculoskeletal:  No edema  LABS: PULMONARY No results found for this basename: PHART, PCO2, PCO2ART, PO2, PO2ART, HCO3, TCO2, O2SAT,  in the last 168 hours  CBC  Recent Labs Lab 11/19/13 0357  11/20/13 0837 11/21/13 0445  HGB 8.0* 7.3* 7.9*  HCT 25.6* 23.0* 24.1*  WBC 4.5 5.1 5.7  PLT 95* 96* 83*    CHEMISTRY  Recent Labs Lab 11/16/13 0320 11/17/13 0320 11/17/13 0330 11/18/13 0536 11/19/13 0357 11/20/13 0420 11/20/13 0837 11/21/13 0445  NA 150*  --  151* 151* 152*  --  144 144  K 4.6  --  4.4 4.0 3.8  --  3.4* 3.6*  CL 112  --  114* 113* 113*  --  106 106  CO2 22  --  24 27 29   --  26 27  GLUCOSE 105*  --  96 139* 114*  --  109* 108*  BUN 66*  --  61* 48* 39*  --  30* 28*  CREATININE 1.76*  --  1.59* 1.38* 1.43*  --  1.46* 1.65*  CALCIUM 8.7  --  8.8 8.6 8.3*  --  7.9* 7.9*  MG 2.9* 2.8*  --   --  2.4 2.3 2.1 2.1  PHOS 3.6 3.9  --   --  3.4 3.4  --  3.0     IMAGING x48h No results found.   ASSESSMENT / PLAN:  PULMONARY A:Hx of COPD. Recent AECOPD 11/11/13 Rx with prednisone. Stable Recent Acute resp distress, multifactorial, resolved P:   Close to baseline, continue to mobilize.  On 2-2.5L/min at home, plan to continue Continue brovana + pulmicort, prn albuterol (home regimen) F/u w Dr Melvyn Novas as an outpt  GASTROINTESTINAL A:  Sigmoid perf. Considerd mod-high risk  pulm complications from surgery. No contraindications  - med mgmt route decided by [facility]. Improved P:   Per [facility] and [facility]  Please call if PCCM can help in any way.   Baltazar Apo, MD, PhD 11/21/2013, 10:25 AM [facility] [phone] or if no answer [phone]

## 2013-11-21 NOTE — Progress Notes (Signed)
Looks significantly better.  Follow up with [place] prn.

## 2013-11-21 NOTE — Progress Notes (Signed)
  Subjective: Sitting up with O2 on, says he's been coughing since they gave him K+.  He denies any abdominal pain and feels better.  He notes whole anterior abdomen hurt when he came in.  Objective: Vital signs in last 24 hours: Temp:  [97.4 F (36.3 C)-98.5 F (36.9 C)] 98.1 F (36.7 C) (10/26 0433) Pulse Rate:  [66-78] 70 (10/26 0433) Resp:  [21-24] 21 (10/26 0433) BP: (108-125)/(45-90) 123/90 mmHg (10/26 0433) SpO2:  [96 %-100 %] 100 % (10/26 0433) Weight:  [79.8 kg (175 lb 14.8 oz)] 79.8 kg (175 lb 14.8 oz) (10/26 0433) Last BM Date: 11/20/13 840 PO BM x 2 yesterday; soft diet Afebrile, VSS Creatinine is up some Last CT 11/13/13 Intake/Output from previous day: 10/25 0701 - 10/26 0700 In: 6767 [P.O.:840; I.V.:800; IV Piggyback:75] Out: 950 [Urine:950] Intake/Output this shift: Total I/O In: 240 [P.O.:240] Out: -   General appearance: alert, cooperative, no distress and O2 on Resp: few rales at the bases GI: soft, non-tender; bowel sounds normal; no masses,  no organomegaly  Lab Results:   Recent Labs  11/20/13 0837 11/21/13 0445  WBC 5.1 5.7  HGB 7.3* 7.9*  HCT 23.0* 24.1*  PLT 96* 83*    BMET  Recent Labs  11/20/13 0837 11/21/13 0445  NA 144 144  K 3.4* 3.6*  CL 106 106  CO2 26 27  GLUCOSE 109* 108*  BUN 30* 28*  CREATININE 1.46* 1.65*  CALCIUM 7.9* 7.9*   PT/INR No results found for this basename: LABPROT, INR,  in the last 72 hours  No results found for this basename: AST, ALT, ALKPHOS, BILITOT, PROT, ALBUMIN,  in the last 168 hours   Lipase     Component Value Date/Time   LIPASE 19 11/13/2013 0455     Studies/Results: No results found.  Medications: . antiseptic oral rinse  7 mL Mouth Rinse BID  . arformoterol  15 mcg Nebulization BID  . budesonide  0.5 mg Nebulization BID  . fluticasone  2 spray Each Nare BID  . ipratropium-albuterol  3 mL Nebulization Q4H  . magic mouthwash  10 mL Oral QID  . pantoprazole (PROTONIX) IV  40  mg Intravenous Q12H  . piperacillin-tazobactam (ZOSYN)  IV  3.375 g Intravenous 3 times per day  . potassium chloride  40 mEq Oral Once  . sodium chloride  2 spray Each Nare BID  . sodium chloride  3 mL Intravenous Q12H    Assessment/Plan 1.   Acute sigmoid diverticulitis with perforation and mild to moderate amount of pneumoperitoneum, 11/13/13. 2. Severe COPD with acute exacerbation and respiratory distress.  3. Acute on chronic renal failure  4. Coronary artery disease congestive heart failure and pacemaker present 5.  Mild renal insuffiencey 6.  Anemia   7.  On Plavix/SCD   Plan:  Currently no surgical need.  S/p 8 days of Zosyn; urine culture: no growth.  At this point We would switch to oral antibiotics for his diverticulitis.  Follow up with PCP, for this.  Medical management per Medicine team.   LOS: 8 days    Deloyce Walthers 11/21/2013

## 2013-11-21 NOTE — Progress Notes (Signed)
Physical Therapy Treatment Patient Details Name: Normon Pettijohn MRN: [id number] DOB: 03-12-1925 Today's Date: 11/21/2013    History of Present Illness 80 year old male with a history of sudden cardiac arrest status post AICD, ischemic cardiomyopathy (EF31% by MRI), COPD (seen in ED 10/16 for acute flare up but went home per his wish), thalassemia, presented with acute onset of abdominal pain on the morning of the admission, a/w some abdominal distention. Dx of perforated diverticulitis and pneumoperiteum. Non surgical intervension.     PT Comments    *Pt progressing with mobility. He walked 95' today with RW, distance limited by SOB. SaO2 94% on RA after walking.  Pt stated that at baseline he primarily uses a WC for mobility due to his knees giving out. **  Follow Up Recommendations  Home health PT;Supervision/Assistance - 24 hour     Equipment Recommendations  None recommended by PT    Recommendations for Other Services       Precautions / Restrictions Precautions Precautions: Fall Restrictions Weight Bearing Restrictions: No    Mobility  Bed Mobility Overal bed mobility: Modified Independent Bed Mobility: Supine to Sit     Supine to sit: Modified independent (Device/Increase time);HOB elevated     General bed mobility comments: used rail to pull up  Transfers Overall transfer level: Needs assistance Equipment used: Rolling walker (2 wheeled) Transfers: Sit to/from Stand Sit to Stand: Min assist         General transfer comment: assist to power up  Ambulation/Gait   Ambulation Distance (Feet): 95 Feet Assistive device: Rolling walker (2 wheeled) Gait Pattern/deviations: Trunk flexed;WFL(Within Functional Limits);Decreased stride length     General Gait Details: flexed trunk, increased distance overall today, pt SOB with walking, SaO2 94% on RA, HR 80   Stairs            Wheelchair Mobility    Modified Rankin (Stroke Patients Only)        Balance     Sitting balance-Leahy Scale: Good       Standing balance-Leahy Scale: Fair Standing balance comment: steady with RW                    Cognition Arousal/Alertness: Awake/alert Behavior During Therapy: WFL for tasks assessed/performed Overall Cognitive Status: Within Functional Limits for tasks assessed                      Exercises      General Comments        Pertinent Vitals/Pain Pain Assessment: No/denies pain    Home Living                      Prior Function            PT Goals (current goals can now be found in the care plan section) Acute Rehab PT Goals Patient Stated Goal: To return home, independent PT Goal Formulation: With patient/family Time For Goal Achievement: 12/03/13 Potential to Achieve Goals: Good Progress towards PT goals: Progressing toward goals    Frequency  Min 3X/week    PT Plan Current plan remains appropriate    Co-evaluation             End of Session Equipment Utilized During Treatment: Gait belt Activity Tolerance: Patient limited by fatigue Patient left: in chair;with call bell/phone within reach     Time: 0930-0947 PT Time Calculation (min): 17 min  Charges:  $Gait Training: 8-22 mins  G Codes:      Philomena Doheny 11/21/2013, 9:55 AM [phone]

## 2013-11-21 NOTE — Evaluation (Signed)
SLP Cancellation Note  Patient Details Name: Rayden Dock MRN: [id number] DOB: 10/13/1925   Cancelled treatment:       Reason Eval/Treat Not Completed:  (pt receiving breathing tx currently, will attempt evaluation at later time)  Luanna Salk, [facility] SLP [phone]

## 2013-11-22 LAB — PHOSPHORUS: PHOSPHORUS: 2.7 mg/dL (ref 2.3–4.6)

## 2013-11-22 LAB — MAGNESIUM: MAGNESIUM: 2 mg/dL (ref 1.5–2.5)

## 2013-11-22 NOTE — Progress Notes (Signed)
[facility] PROGRESS NOTE  [name] [name] HWT:[id number] DOB: April 16, 1925 DOA: 11/13/2013 PCP: [name], MD  Assessment/Plan: Sepsis, secondary to perforated sigmoid diverticulitis / peritonitis / ileus and/or partial small bowel obstruction  - Patient was admitted with abdominal distention and CT abdomen/pelvis showed acute sigmoid diverticulitis complicated by a free perforation. In addition patient had urine cultures growing Klebsiella pneumonia (done on previous visit 11/11/2013). Please note that repeat urine culture on this admission shows no growth. Patient remains hemodynamically stable. He has not required pressor support during this hospital stay.  - Per surgery, given high risk for surgical intervention, recommendation was for medical management  - Patient was noted to have more abdominal distention on 11/17/2013. Abdominal x-ray revealed ileus versus partial small bowel obstruction. NG tube placed. Abdominal x-ray on 11/20/2103 showed nonspecific bowel gas pattern without obstruction. Since abdominal distention started to improve, NG was removed. Diet advanced and tolerating well.  -patient clinically improving. Was on empiric zosyn, now transitioned to oral ciprofloxacin and flagyl to complete a 14 day course of abx upon discharge.  .  Active Problems:  Acute Respiratory Failure with Hypoxia  - Secondary to COPD exacerbation and resp distress with underlying sepsis  - Steroids contraindicated because of acute diverticulitis complicated by free perforation.  - Respiratory status is stable. Patient is saturating >90% on RA although gets dyspnic on ambulation.  -resume home inhaler and neb upon discharge. contieu nighttime o2.  Follow up with Dr Melvyn Novas in 3 weeks.  Surgery and PCCM signed off and appreciate recommendations.  Ischemic cardiomyopathy with history of sudden cardiac death, s/p AICD  - Aspirin and Plavix still on hold because of high risk of bleeding. Can be  resumed after 2 weeks as ouitpt if stable. Resume home BP meds  Klebsiella UTI  - Urine culture on 11/11/2013 grew Klebsiella pneumoniae. Sensitivity report confirmed sensitivity to Zosyn. Urine culture on this admission shows no growth.  Ischemic cardiomyopathy/ CAD  Stable and euvolemic. continue home meds except for ASA and plavix held to avoid bleeding while pt septic. May resume after completing 2 weeks fo abx without any further issues.  Chronic Thrombocytopenia  - Likely due to thalassemia. Patient has scattered ecchymosis over upper extremities. No bleeding from mucosal surfaces.  Acute on chronic blood loss anemia / anemia of chronic disease  - Mild drop in H&H noted. Hemoglobin 7.9 today. Follow as outpt  Acute on CKD stage 3  - Baseline creatinine of 1.6. Improved with hydration.  Hypernatremia  - Secondary to dehydration. IV fluids changed to D5 and improved this am  Severe Protein calorie malnutrition  - Poor PO intake in the context of acute illness. Now on soft diet.  generalized weakness  seen by PT recommend home with HHPT and 24 hr supervision. Ambulate with rolling walker.  diabetes mellitus  stable  Not on any meds  Consult:  PCCM  CCS  Cardiology  Diabetic: heart healthy/ diabetic   Code Status: Full Family Communication: Pt in room (indicate person spoken with, relationship, and if by phone, the number) Disposition Plan: D/c today   Consultants:    Procedures:    Antibiotics:   (indicate start date, and stop date if known)  HPI/Subjective: Feels well. Eager to go home  Objective: Filed Vitals:   11/21/13 1944 11/21/13 2119 11/22/13 0423 11/22/13 0816  BP:  115/50 133/61   Pulse:  79 73   Temp:  98 F (36.7 C) 98 F (36.7 C)   TempSrc:  Oral Oral  Resp:  20 20   Height:      Weight:   79.7 kg (175 lb 11.3 oz)   SpO2: 93% 97% 99% 95%    Intake/Output Summary (Last 24 hours) at 11/22/13 1145 Last data filed at 11/22/13 0900  Gross  per 24 hour  Intake 1735.83 ml  Output   2000 ml  Net -264.17 ml   Filed Weights   11/20/13 0400 11/21/13 0433 11/22/13 0423  Weight: 80.3 kg (177 lb 0.5 oz) 79.8 kg (175 lb 14.8 oz) 79.7 kg (175 lb 11.3 oz)    Exam:   General:  Awake, in nad  Cardiovascular: regular, s1, s2  Respiratory: normal resp effort, no wheezing  Abdomen: soft,nondistended, pos bs  Musculoskeletal: perfused, no clubbing   Data Reviewed: Basic Metabolic Panel:  Recent Labs Lab 11/17/13 0320 11/17/13 0330 11/18/13 0536 11/19/13 0357 11/20/13 0420 11/20/13 0837 11/21/13 0445 11/22/13 0348  NA  --  151* 151* 152*  --  144 144  --   K  --  4.4 4.0 3.8  --  3.4* 3.6*  --   CL  --  114* 113* 113*  --  106 106  --   CO2  --  24 27 29   --  26 27  --   GLUCOSE  --  96 139* 114*  --  109* 108*  --   BUN  --  61* 48* 39*  --  30* 28*  --   CREATININE  --  1.59* 1.38* 1.43*  --  1.46* 1.65*  --   CALCIUM  --  8.8 8.6 8.3*  --  7.9* 7.9*  --   MG 2.8*  --   --  2.4 2.3 2.1 2.1 2.0  PHOS 3.9  --   --  3.4 3.4  --  3.0 2.7   Liver Function Tests: No results found for this basename: AST, ALT, ALKPHOS, BILITOT, PROT, ALBUMIN,  in the last 168 hours No results found for this basename: LIPASE, AMYLASE,  in the last 168 hours No results found for this basename: AMMONIA,  in the last 168 hours CBC:  Recent Labs Lab 11/16/13 0320 11/17/13 0320 11/19/13 0357 11/20/13 0837 11/21/13 0445  WBC 5.1 4.0 4.5 5.1 5.7  NEUTROABS 4.4 3.2  --   --   --   HGB 8.0* 8.1* 8.0* 7.3* 7.9*  HCT 25.2* 25.3* 25.6* 23.0* 24.1*  MCV 66.7* 67.1* 67.5* 67.4* 66.2*  PLT 73* 77* 95* 96* 83*   Cardiac Enzymes: No results found for this basename: CKTOTAL, CKMB, CKMBINDEX, TROPONINI,  in the last 168 hours BNP (last 3 results)  Recent Labs  07/01/13 0625 11/11/13 2139  PROBNP 330.2 759.3*   CBG: No results found for this basename: GLUCAP,  in the last 168 hours  Recent Results (from the past 240 hour(s))   CULTURE, BLOOD (ROUTINE X 2)     Status: None   Collection Time    11/13/13  6:10 AM      Result Value Ref Range Status   Specimen Description BLOOD RIGHT ANTECUBITAL   Final   Special Requests BOTTLES DRAWN AEROBIC AND ANAEROBIC [facility] EACH   Final   Culture  Setup Time     Final   Value: 11/13/2013 15:19     Performed at [place]     Final   Value: NO GROWTH 5 DAYS     Performed at [facility]   Report Status 11/19/2013  FINAL   Final  CULTURE, BLOOD (ROUTINE X 2)     Status: None   Collection Time    11/13/13  6:11 AM      Result Value Ref Range Status   Specimen Description BLOOD LEFT ANTECUBITAL   Final   Special Requests BOTTLES DRAWN AEROBIC AND ANAEROBIC [facility] EACH   Final   Culture  Setup Time     Final   Value: 11/13/2013 15:19     Performed at [facility]   Culture     Final   Value: NO GROWTH 5 DAYS     Performed at [facility]   Report Status 11/19/2013 FINAL   Final  URINE CULTURE     Status: None   Collection Time    11/13/13  6:50 AM      Result Value Ref Range Status   Specimen Description URINE, CATHETERIZED   Final   Special Requests NONE   Final   Culture  Setup Time     Final   Value: 11/13/2013 15:40     Performed at [place]     Final   Value: NO GROWTH     Performed at [facility]   Culture     Final   Value: NO GROWTH     Performed at [facility]   Report Status 11/14/2013 FINAL   Final  MRSA PCR SCREENING     Status: None   Collection Time    11/13/13 10:05 AM      Result Value Ref Range Status   MRSA by PCR NEGATIVE  NEGATIVE Final   Comment:            The GeneXpert MRSA Assay (FDA     approved for NASAL specimens     only), is one component of a     comprehensive MRSA colonization     surveillance program. It is not     intended to diagnose MRSA     infection nor to guide or     monitor treatment for     MRSA infections.  CULTURE, EXPECTORATED  SPUTUM-ASSESSMENT     Status: None   Collection Time    11/15/13 11:51 PM      Result Value Ref Range Status   Specimen Description SPUTUM   Final   Special Requests Normal   Final   Sputum evaluation     Final   Value: THIS SPECIMEN IS ACCEPTABLE. RESPIRATORY CULTURE REPORT TO FOLLOW.   Report Status 11/16/2013 FINAL   Final  CULTURE, RESPIRATORY (NON-EXPECTORATED)     Status: None   Collection Time    11/15/13 11:51 PM      Result Value Ref Range Status   Specimen Description SPUTUM   Final   Special Requests NONE   Final   Gram Stain     Final   Value: FEW WBC PRESENT, PREDOMINANTLY PMN     FEW SQUAMOUS EPITHELIAL CELLS PRESENT     RARE YEAST     Performed at [facility]   Culture     Final   Value: FEW CANDIDA ALBICANS     Performed at [facility]   Report Status 11/18/2013 FINAL   Final     Studies: No results found.  Scheduled Meds: . antiseptic oral rinse  7 mL Mouth Rinse BID  . arformoterol  15 mcg Nebulization BID  . budesonide  0.5 mg Nebulization BID  .  ciprofloxacin  500 mg Oral BID  . fluticasone  2 spray Each Nare BID  . ipratropium-albuterol  3 mL Nebulization Q4H  . magic mouthwash  10 mL Oral QID  . metroNIDAZOLE  500 mg Oral 3 times per day  . pantoprazole (PROTONIX) IV  40 mg Intravenous Q12H  . sodium chloride  2 spray Each Nare BID  . sodium chloride  3 mL Intravenous Q12H   Continuous Infusions: . dextrose 50 mL/hr at 11/21/13 2146    Active Problems:   Chronic kidney disease, stage 3   Ischemic cardiomyopathy   Acute on chronic renal insufficiency   Thrombocytopenia   Beta thalassemia trait   Acute respiratory failure   Preoperative respiratory examination   Hypernatremia   COPD (chronic obstructive pulmonary disease)   Diverticulitis of colon with perforation   Severe sepsis   Adynamic ileus   COPD exacerbation   UTI (urinary tract infection), bacterial   Severe protein-calorie malnutrition  Time spent:  30min  [facility], [facility] Pager [phone]. If 7PM-7AM, please contact night-coverage at [email or web address], password [facility] 11/22/2013, 11:45 AM  LOS: 9 days

## 2013-11-23 DIAGNOSIS — Z95810 Presence of automatic (implantable) cardiac defibrillator: Secondary | ICD-10-CM | POA: Diagnosis not present

## 2013-11-23 DIAGNOSIS — K5720 Diverticulitis of large intestine with perforation and abscess without bleeding: Secondary | ICD-10-CM | POA: Diagnosis not present

## 2013-11-23 DIAGNOSIS — I129 Hypertensive chronic kidney disease with stage 1 through stage 4 chronic kidney disease, or unspecified chronic kidney disease: Secondary | ICD-10-CM | POA: Diagnosis not present

## 2013-11-23 DIAGNOSIS — B961 Klebsiella pneumoniae [K. pneumoniae] as the cause of diseases classified elsewhere: Secondary | ICD-10-CM | POA: Diagnosis not present

## 2013-11-23 DIAGNOSIS — Z8674 Personal history of sudden cardiac arrest: Secondary | ICD-10-CM | POA: Diagnosis not present

## 2013-11-23 DIAGNOSIS — E119 Type 2 diabetes mellitus without complications: Secondary | ICD-10-CM | POA: Diagnosis not present

## 2013-11-23 DIAGNOSIS — I255 Ischemic cardiomyopathy: Secondary | ICD-10-CM | POA: Diagnosis not present

## 2013-11-23 DIAGNOSIS — M6281 Muscle weakness (generalized): Secondary | ICD-10-CM | POA: Diagnosis not present

## 2013-11-23 DIAGNOSIS — N183 Chronic kidney disease, stage 3 (moderate): Secondary | ICD-10-CM | POA: Diagnosis not present

## 2013-11-23 DIAGNOSIS — D500 Iron deficiency anemia secondary to blood loss (chronic): Secondary | ICD-10-CM | POA: Diagnosis not present

## 2013-11-23 DIAGNOSIS — J441 Chronic obstructive pulmonary disease with (acute) exacerbation: Secondary | ICD-10-CM | POA: Diagnosis not present

## 2013-11-23 DIAGNOSIS — I509 Heart failure, unspecified: Secondary | ICD-10-CM | POA: Diagnosis not present

## 2013-11-23 DIAGNOSIS — Z87891 Personal history of nicotine dependence: Secondary | ICD-10-CM | POA: Diagnosis not present

## 2013-11-23 DIAGNOSIS — H3530 Unspecified macular degeneration: Secondary | ICD-10-CM | POA: Diagnosis not present

## 2013-11-23 DIAGNOSIS — I252 Old myocardial infarction: Secondary | ICD-10-CM | POA: Diagnosis not present

## 2013-11-23 DIAGNOSIS — E43 Unspecified severe protein-calorie malnutrition: Secondary | ICD-10-CM | POA: Diagnosis not present

## 2013-11-23 DIAGNOSIS — D563 Thalassemia minor: Secondary | ICD-10-CM | POA: Diagnosis not present

## 2013-11-23 DIAGNOSIS — N390 Urinary tract infection, site not specified: Secondary | ICD-10-CM | POA: Diagnosis not present

## 2013-11-25 DIAGNOSIS — E119 Type 2 diabetes mellitus without complications: Secondary | ICD-10-CM | POA: Diagnosis not present

## 2013-11-25 DIAGNOSIS — N390 Urinary tract infection, site not specified: Secondary | ICD-10-CM | POA: Diagnosis not present

## 2013-11-25 DIAGNOSIS — E43 Unspecified severe protein-calorie malnutrition: Secondary | ICD-10-CM | POA: Diagnosis not present

## 2013-11-25 DIAGNOSIS — K5720 Diverticulitis of large intestine with perforation and abscess without bleeding: Secondary | ICD-10-CM | POA: Diagnosis not present

## 2013-11-25 DIAGNOSIS — B961 Klebsiella pneumoniae [K. pneumoniae] as the cause of diseases classified elsewhere: Secondary | ICD-10-CM | POA: Diagnosis not present

## 2013-11-25 DIAGNOSIS — J441 Chronic obstructive pulmonary disease with (acute) exacerbation: Secondary | ICD-10-CM | POA: Diagnosis not present

## 2013-11-28 DIAGNOSIS — E43 Unspecified severe protein-calorie malnutrition: Secondary | ICD-10-CM | POA: Diagnosis not present

## 2013-11-28 DIAGNOSIS — N390 Urinary tract infection, site not specified: Secondary | ICD-10-CM | POA: Diagnosis not present

## 2013-11-28 DIAGNOSIS — J441 Chronic obstructive pulmonary disease with (acute) exacerbation: Secondary | ICD-10-CM | POA: Diagnosis not present

## 2013-11-28 DIAGNOSIS — K5720 Diverticulitis of large intestine with perforation and abscess without bleeding: Secondary | ICD-10-CM | POA: Diagnosis not present

## 2013-11-28 DIAGNOSIS — B961 Klebsiella pneumoniae [K. pneumoniae] as the cause of diseases classified elsewhere: Secondary | ICD-10-CM | POA: Diagnosis not present

## 2013-11-28 DIAGNOSIS — E119 Type 2 diabetes mellitus without complications: Secondary | ICD-10-CM | POA: Diagnosis not present

## 2013-11-30 DIAGNOSIS — E119 Type 2 diabetes mellitus without complications: Secondary | ICD-10-CM | POA: Diagnosis not present

## 2013-11-30 DIAGNOSIS — J441 Chronic obstructive pulmonary disease with (acute) exacerbation: Secondary | ICD-10-CM | POA: Diagnosis not present

## 2013-11-30 DIAGNOSIS — E43 Unspecified severe protein-calorie malnutrition: Secondary | ICD-10-CM | POA: Diagnosis not present

## 2013-11-30 DIAGNOSIS — K5720 Diverticulitis of large intestine with perforation and abscess without bleeding: Secondary | ICD-10-CM | POA: Diagnosis not present

## 2013-11-30 DIAGNOSIS — B961 Klebsiella pneumoniae [K. pneumoniae] as the cause of diseases classified elsewhere: Secondary | ICD-10-CM | POA: Diagnosis not present

## 2013-11-30 DIAGNOSIS — N390 Urinary tract infection, site not specified: Secondary | ICD-10-CM | POA: Diagnosis not present

## 2013-12-01 DIAGNOSIS — Z23 Encounter for immunization: Secondary | ICD-10-CM | POA: Diagnosis not present

## 2013-12-01 DIAGNOSIS — G5600 Carpal tunnel syndrome, unspecified upper limb: Secondary | ICD-10-CM | POA: Diagnosis not present

## 2013-12-01 DIAGNOSIS — K5720 Diverticulitis of large intestine with perforation and abscess without bleeding: Secondary | ICD-10-CM | POA: Diagnosis not present

## 2013-12-01 DIAGNOSIS — J449 Chronic obstructive pulmonary disease, unspecified: Secondary | ICD-10-CM | POA: Diagnosis not present

## 2013-12-02 DIAGNOSIS — E43 Unspecified severe protein-calorie malnutrition: Secondary | ICD-10-CM | POA: Diagnosis not present

## 2013-12-02 DIAGNOSIS — E119 Type 2 diabetes mellitus without complications: Secondary | ICD-10-CM | POA: Diagnosis not present

## 2013-12-02 DIAGNOSIS — K5720 Diverticulitis of large intestine with perforation and abscess without bleeding: Secondary | ICD-10-CM | POA: Diagnosis not present

## 2013-12-02 DIAGNOSIS — J441 Chronic obstructive pulmonary disease with (acute) exacerbation: Secondary | ICD-10-CM | POA: Diagnosis not present

## 2013-12-02 DIAGNOSIS — B961 Klebsiella pneumoniae [K. pneumoniae] as the cause of diseases classified elsewhere: Secondary | ICD-10-CM | POA: Diagnosis not present

## 2013-12-02 DIAGNOSIS — N390 Urinary tract infection, site not specified: Secondary | ICD-10-CM | POA: Diagnosis not present

## 2013-12-05 DIAGNOSIS — J441 Chronic obstructive pulmonary disease with (acute) exacerbation: Secondary | ICD-10-CM | POA: Diagnosis not present

## 2013-12-05 DIAGNOSIS — E43 Unspecified severe protein-calorie malnutrition: Secondary | ICD-10-CM | POA: Diagnosis not present

## 2013-12-05 DIAGNOSIS — E119 Type 2 diabetes mellitus without complications: Secondary | ICD-10-CM | POA: Diagnosis not present

## 2013-12-05 DIAGNOSIS — N390 Urinary tract infection, site not specified: Secondary | ICD-10-CM | POA: Diagnosis not present

## 2013-12-05 DIAGNOSIS — B961 Klebsiella pneumoniae [K. pneumoniae] as the cause of diseases classified elsewhere: Secondary | ICD-10-CM | POA: Diagnosis not present

## 2013-12-05 DIAGNOSIS — K5720 Diverticulitis of large intestine with perforation and abscess without bleeding: Secondary | ICD-10-CM | POA: Diagnosis not present

## 2013-12-07 DIAGNOSIS — K5720 Diverticulitis of large intestine with perforation and abscess without bleeding: Secondary | ICD-10-CM | POA: Diagnosis not present

## 2013-12-07 DIAGNOSIS — E119 Type 2 diabetes mellitus without complications: Secondary | ICD-10-CM | POA: Diagnosis not present

## 2013-12-07 DIAGNOSIS — N390 Urinary tract infection, site not specified: Secondary | ICD-10-CM | POA: Diagnosis not present

## 2013-12-07 DIAGNOSIS — B961 Klebsiella pneumoniae [K. pneumoniae] as the cause of diseases classified elsewhere: Secondary | ICD-10-CM | POA: Diagnosis not present

## 2013-12-07 DIAGNOSIS — E43 Unspecified severe protein-calorie malnutrition: Secondary | ICD-10-CM | POA: Diagnosis not present

## 2013-12-07 DIAGNOSIS — J441 Chronic obstructive pulmonary disease with (acute) exacerbation: Secondary | ICD-10-CM | POA: Diagnosis not present

## 2013-12-10 DIAGNOSIS — E43 Unspecified severe protein-calorie malnutrition: Secondary | ICD-10-CM | POA: Diagnosis not present

## 2013-12-10 DIAGNOSIS — N390 Urinary tract infection, site not specified: Secondary | ICD-10-CM | POA: Diagnosis not present

## 2013-12-10 DIAGNOSIS — E119 Type 2 diabetes mellitus without complications: Secondary | ICD-10-CM | POA: Diagnosis not present

## 2013-12-10 DIAGNOSIS — B961 Klebsiella pneumoniae [K. pneumoniae] as the cause of diseases classified elsewhere: Secondary | ICD-10-CM | POA: Diagnosis not present

## 2013-12-10 DIAGNOSIS — J441 Chronic obstructive pulmonary disease with (acute) exacerbation: Secondary | ICD-10-CM | POA: Diagnosis not present

## 2013-12-10 DIAGNOSIS — K5720 Diverticulitis of large intestine with perforation and abscess without bleeding: Secondary | ICD-10-CM | POA: Diagnosis not present

## 2013-12-12 DIAGNOSIS — E43 Unspecified severe protein-calorie malnutrition: Secondary | ICD-10-CM | POA: Diagnosis not present

## 2013-12-12 DIAGNOSIS — J441 Chronic obstructive pulmonary disease with (acute) exacerbation: Secondary | ICD-10-CM | POA: Diagnosis not present

## 2013-12-12 DIAGNOSIS — N390 Urinary tract infection, site not specified: Secondary | ICD-10-CM | POA: Diagnosis not present

## 2013-12-12 DIAGNOSIS — B961 Klebsiella pneumoniae [K. pneumoniae] as the cause of diseases classified elsewhere: Secondary | ICD-10-CM | POA: Diagnosis not present

## 2013-12-12 DIAGNOSIS — E119 Type 2 diabetes mellitus without complications: Secondary | ICD-10-CM | POA: Diagnosis not present

## 2013-12-12 DIAGNOSIS — K5720 Diverticulitis of large intestine with perforation and abscess without bleeding: Secondary | ICD-10-CM | POA: Diagnosis not present

## 2013-12-14 DIAGNOSIS — N390 Urinary tract infection, site not specified: Secondary | ICD-10-CM | POA: Diagnosis not present

## 2013-12-14 DIAGNOSIS — J441 Chronic obstructive pulmonary disease with (acute) exacerbation: Secondary | ICD-10-CM | POA: Diagnosis not present

## 2013-12-14 DIAGNOSIS — K5720 Diverticulitis of large intestine with perforation and abscess without bleeding: Secondary | ICD-10-CM | POA: Diagnosis not present

## 2013-12-14 DIAGNOSIS — E43 Unspecified severe protein-calorie malnutrition: Secondary | ICD-10-CM | POA: Diagnosis not present

## 2013-12-14 DIAGNOSIS — B961 Klebsiella pneumoniae [K. pneumoniae] as the cause of diseases classified elsewhere: Secondary | ICD-10-CM | POA: Diagnosis not present

## 2013-12-14 DIAGNOSIS — E119 Type 2 diabetes mellitus without complications: Secondary | ICD-10-CM | POA: Diagnosis not present

## 2013-12-16 DIAGNOSIS — K5720 Diverticulitis of large intestine with perforation and abscess without bleeding: Secondary | ICD-10-CM | POA: Diagnosis not present

## 2013-12-16 DIAGNOSIS — E43 Unspecified severe protein-calorie malnutrition: Secondary | ICD-10-CM | POA: Diagnosis not present

## 2013-12-16 DIAGNOSIS — N390 Urinary tract infection, site not specified: Secondary | ICD-10-CM | POA: Diagnosis not present

## 2013-12-16 DIAGNOSIS — J441 Chronic obstructive pulmonary disease with (acute) exacerbation: Secondary | ICD-10-CM | POA: Diagnosis not present

## 2013-12-16 DIAGNOSIS — B961 Klebsiella pneumoniae [K. pneumoniae] as the cause of diseases classified elsewhere: Secondary | ICD-10-CM | POA: Diagnosis not present

## 2013-12-16 DIAGNOSIS — E119 Type 2 diabetes mellitus without complications: Secondary | ICD-10-CM | POA: Diagnosis not present

## 2013-12-19 DIAGNOSIS — E43 Unspecified severe protein-calorie malnutrition: Secondary | ICD-10-CM | POA: Diagnosis not present

## 2013-12-19 DIAGNOSIS — K5720 Diverticulitis of large intestine with perforation and abscess without bleeding: Secondary | ICD-10-CM | POA: Diagnosis not present

## 2013-12-19 DIAGNOSIS — B961 Klebsiella pneumoniae [K. pneumoniae] as the cause of diseases classified elsewhere: Secondary | ICD-10-CM | POA: Diagnosis not present

## 2013-12-19 DIAGNOSIS — E119 Type 2 diabetes mellitus without complications: Secondary | ICD-10-CM | POA: Diagnosis not present

## 2013-12-19 DIAGNOSIS — J441 Chronic obstructive pulmonary disease with (acute) exacerbation: Secondary | ICD-10-CM | POA: Diagnosis not present

## 2013-12-19 DIAGNOSIS — N390 Urinary tract infection, site not specified: Secondary | ICD-10-CM | POA: Diagnosis not present

## 2013-12-23 DIAGNOSIS — E119 Type 2 diabetes mellitus without complications: Secondary | ICD-10-CM | POA: Diagnosis not present

## 2013-12-23 DIAGNOSIS — N390 Urinary tract infection, site not specified: Secondary | ICD-10-CM | POA: Diagnosis not present

## 2013-12-23 DIAGNOSIS — B961 Klebsiella pneumoniae [K. pneumoniae] as the cause of diseases classified elsewhere: Secondary | ICD-10-CM | POA: Diagnosis not present

## 2013-12-23 DIAGNOSIS — J441 Chronic obstructive pulmonary disease with (acute) exacerbation: Secondary | ICD-10-CM | POA: Diagnosis not present

## 2013-12-23 DIAGNOSIS — K5720 Diverticulitis of large intestine with perforation and abscess without bleeding: Secondary | ICD-10-CM | POA: Diagnosis not present

## 2013-12-23 DIAGNOSIS — E43 Unspecified severe protein-calorie malnutrition: Secondary | ICD-10-CM | POA: Diagnosis not present

## 2013-12-26 ENCOUNTER — Encounter: Payer: Self-pay | Admitting: Internal Medicine

## 2013-12-26 ENCOUNTER — Ambulatory Visit (INDEPENDENT_AMBULATORY_CARE_PROVIDER_SITE_OTHER): Payer: Medicare Other | Admitting: *Deleted

## 2013-12-26 ENCOUNTER — Telehealth: Payer: Self-pay | Admitting: Cardiology

## 2013-12-26 DIAGNOSIS — I255 Ischemic cardiomyopathy: Secondary | ICD-10-CM

## 2013-12-26 LAB — MDC_IDC_ENUM_SESS_TYPE_REMOTE
Battery Remaining Longevity: 71 mo
Battery Voltage: 2.96 V
Brady Statistic AP VP Percent: 1 %
Brady Statistic AP VS Percent: 1 %
Brady Statistic RA Percent Paced: 1 %
Brady Statistic RV Percent Paced: 1 %
Date Time Interrogation Session: 20151130160825
HighPow Impedance: 70 Ohm
HighPow Impedance: 70 Ohm
Implantable Pulse Generator Serial Number: 623396
Lead Channel Impedance Value: 400 Ohm
Lead Channel Impedance Value: 400 Ohm
Lead Channel Pacing Threshold Amplitude: 1 V
Lead Channel Pacing Threshold Pulse Width: 0.5 ms
Lead Channel Sensing Intrinsic Amplitude: 10.1 mV
MDC IDC MSMT BATTERY REMAINING PERCENTAGE: 63 %
MDC IDC MSMT LEADCHNL RA PACING THRESHOLD AMPLITUDE: 0.5 V
MDC IDC MSMT LEADCHNL RA SENSING INTR AMPL: 3.1 mV
MDC IDC MSMT LEADCHNL RV PACING THRESHOLD PULSEWIDTH: 0.5 ms
MDC IDC SET LEADCHNL RA PACING AMPLITUDE: 2 V
MDC IDC SET LEADCHNL RV PACING AMPLITUDE: 2.5 V
MDC IDC SET LEADCHNL RV PACING PULSEWIDTH: 0.5 ms
MDC IDC SET LEADCHNL RV SENSING SENSITIVITY: 0.5 mV
MDC IDC STAT BRADY AS VP PERCENT: 1 %
MDC IDC STAT BRADY AS VS PERCENT: 99 %
Zone Setting Detection Interval: 270 ms
Zone Setting Detection Interval: 335 ms

## 2013-12-26 NOTE — Telephone Encounter (Signed)
LMOVM reminding pt to send remote transmission.   

## 2013-12-27 NOTE — Progress Notes (Signed)
Remote ICD transmission.   

## 2014-01-03 ENCOUNTER — Encounter: Payer: Self-pay | Admitting: Cardiology

## 2014-01-07 DIAGNOSIS — K5720 Diverticulitis of large intestine with perforation and abscess without bleeding: Secondary | ICD-10-CM | POA: Diagnosis not present

## 2014-01-07 DIAGNOSIS — E43 Unspecified severe protein-calorie malnutrition: Secondary | ICD-10-CM | POA: Diagnosis not present

## 2014-01-07 DIAGNOSIS — B961 Klebsiella pneumoniae [K. pneumoniae] as the cause of diseases classified elsewhere: Secondary | ICD-10-CM | POA: Diagnosis not present

## 2014-01-07 DIAGNOSIS — E119 Type 2 diabetes mellitus without complications: Secondary | ICD-10-CM | POA: Diagnosis not present

## 2014-01-07 DIAGNOSIS — J441 Chronic obstructive pulmonary disease with (acute) exacerbation: Secondary | ICD-10-CM | POA: Diagnosis not present

## 2014-01-07 DIAGNOSIS — N390 Urinary tract infection, site not specified: Secondary | ICD-10-CM | POA: Diagnosis not present

## 2014-01-12 ENCOUNTER — Other Ambulatory Visit: Payer: Self-pay | Admitting: Nurse Practitioner

## 2014-01-14 DIAGNOSIS — K5720 Diverticulitis of large intestine with perforation and abscess without bleeding: Secondary | ICD-10-CM | POA: Diagnosis not present

## 2014-01-14 DIAGNOSIS — N390 Urinary tract infection, site not specified: Secondary | ICD-10-CM | POA: Diagnosis not present

## 2014-01-14 DIAGNOSIS — E43 Unspecified severe protein-calorie malnutrition: Secondary | ICD-10-CM | POA: Diagnosis not present

## 2014-01-14 DIAGNOSIS — E119 Type 2 diabetes mellitus without complications: Secondary | ICD-10-CM | POA: Diagnosis not present

## 2014-01-14 DIAGNOSIS — B961 Klebsiella pneumoniae [K. pneumoniae] as the cause of diseases classified elsewhere: Secondary | ICD-10-CM | POA: Diagnosis not present

## 2014-01-14 DIAGNOSIS — J441 Chronic obstructive pulmonary disease with (acute) exacerbation: Secondary | ICD-10-CM | POA: Diagnosis not present

## 2014-01-30 ENCOUNTER — Telehealth: Payer: Self-pay | Admitting: Adult Health

## 2014-01-30 NOTE — Telephone Encounter (Signed)
Pt last seen 05/27/13 and has no pending appt. I called and spoke with [facility] Was advised pt PCP called and gave VO for the medication needed and nothing further needed from [place]

## 2014-02-02 ENCOUNTER — Other Ambulatory Visit: Payer: Self-pay | Admitting: *Deleted

## 2014-02-02 MED ORDER — CLOPIDOGREL BISULFATE 75 MG PO TABS: 75.0000 mg | ORAL_TABLET | ORAL | Status: DC

## 2014-02-06 DIAGNOSIS — N183 Chronic kidney disease, stage 3 (moderate): Secondary | ICD-10-CM | POA: Diagnosis not present

## 2014-02-06 DIAGNOSIS — E780 Pure hypercholesterolemia: Secondary | ICD-10-CM | POA: Diagnosis not present

## 2014-02-06 DIAGNOSIS — J449 Chronic obstructive pulmonary disease, unspecified: Secondary | ICD-10-CM | POA: Diagnosis not present

## 2014-02-06 DIAGNOSIS — E119 Type 2 diabetes mellitus without complications: Secondary | ICD-10-CM | POA: Diagnosis not present

## 2014-02-06 DIAGNOSIS — I2510 Atherosclerotic heart disease of native coronary artery without angina pectoris: Secondary | ICD-10-CM | POA: Diagnosis not present

## 2014-02-06 DIAGNOSIS — I10 Essential (primary) hypertension: Secondary | ICD-10-CM | POA: Diagnosis not present

## 2014-02-22 DIAGNOSIS — J069 Acute upper respiratory infection, unspecified: Secondary | ICD-10-CM | POA: Diagnosis not present

## 2014-03-27 ENCOUNTER — Encounter: Payer: Medicare Other | Admitting: *Deleted

## 2014-03-27 ENCOUNTER — Telehealth: Payer: Self-pay | Admitting: Cardiology

## 2014-03-27 NOTE — Telephone Encounter (Signed)
Attempted to confirm remote transmission with pt. No answer and was unable to leave a message.   

## 2014-03-28 ENCOUNTER — Encounter: Payer: Self-pay | Admitting: Cardiology

## 2014-04-17 ENCOUNTER — Ambulatory Visit (INDEPENDENT_AMBULATORY_CARE_PROVIDER_SITE_OTHER): Payer: Medicare Other | Admitting: *Deleted

## 2014-04-17 DIAGNOSIS — I5020 Unspecified systolic (congestive) heart failure: Secondary | ICD-10-CM | POA: Diagnosis not present

## 2014-04-17 DIAGNOSIS — I469 Cardiac arrest, cause unspecified: Secondary | ICD-10-CM

## 2014-04-17 LAB — MDC_IDC_ENUM_SESS_TYPE_INCLINIC
Battery Remaining Longevity: 70.8 mo
Brady Statistic RA Percent Paced: 0.03 %
HIGH POWER IMPEDANCE MEASURED VALUE: 74.25 Ohm
Implantable Pulse Generator Serial Number: 623396
Lead Channel Impedance Value: 337.5 Ohm
Lead Channel Impedance Value: 387.5 Ohm
Lead Channel Pacing Threshold Amplitude: 0.5 V
Lead Channel Pacing Threshold Amplitude: 1 V
Lead Channel Pacing Threshold Pulse Width: 0.5 ms
Lead Channel Sensing Intrinsic Amplitude: 11.3 mV
Lead Channel Setting Pacing Amplitude: 2 V
Lead Channel Setting Pacing Pulse Width: 0.5 ms
Lead Channel Setting Sensing Sensitivity: 0.5 mV
MDC IDC MSMT LEADCHNL RA PACING THRESHOLD AMPLITUDE: 0.5 V
MDC IDC MSMT LEADCHNL RA PACING THRESHOLD PULSEWIDTH: 0.5 ms
MDC IDC MSMT LEADCHNL RA PACING THRESHOLD PULSEWIDTH: 0.5 ms
MDC IDC MSMT LEADCHNL RA SENSING INTR AMPL: 4.2 mV
MDC IDC MSMT LEADCHNL RV PACING THRESHOLD AMPLITUDE: 1 V
MDC IDC MSMT LEADCHNL RV PACING THRESHOLD PULSEWIDTH: 0.5 ms
MDC IDC SESS DTM: 20160321161851
MDC IDC SET LEADCHNL RV PACING AMPLITUDE: 2.5 V
MDC IDC SET ZONE DETECTION INTERVAL: 270 ms
MDC IDC STAT BRADY RV PERCENT PACED: 0.03 %
Zone Setting Detection Interval: 335 ms

## 2014-04-17 NOTE — Progress Notes (Signed)
ICD check in clinic. Normal device function. Thresholds and sensing consistent with previous device measurements. Impedance trends stable over time. No evidence of any ventricular arrhythmias. 4 AF episodes--longest was 14 seconds.  Histogram distribution appropriate for patient and level of activity. Changed VF NID from 16 to 24. Device programmed at appropriate safety margins. Device programmed to optimize intrinsic conduction. Estimated longevity 5.3 to 5.9 years. Pt enrolled in remote follow-up. ROV in 3 mths w/JA.

## 2014-04-18 DIAGNOSIS — J449 Chronic obstructive pulmonary disease, unspecified: Secondary | ICD-10-CM | POA: Diagnosis not present

## 2014-04-18 DIAGNOSIS — J9611 Chronic respiratory failure with hypoxia: Secondary | ICD-10-CM | POA: Diagnosis not present

## 2014-04-25 DIAGNOSIS — R109 Unspecified abdominal pain: Secondary | ICD-10-CM | POA: Diagnosis not present

## 2014-05-02 ENCOUNTER — Encounter: Payer: Self-pay | Admitting: Internal Medicine

## 2014-06-06 ENCOUNTER — Other Ambulatory Visit: Payer: Self-pay | Admitting: Cardiovascular Disease

## 2014-06-07 ENCOUNTER — Emergency Department (HOSPITAL_COMMUNITY)
Admission: EM | Admit: 2014-06-07 | Discharge: 2014-06-07 | Disposition: A | Discharge status: Home / Self Care | Payer: Medicare Other | Attending: Emergency Medicine | Admitting: Emergency Medicine

## 2014-06-07 ENCOUNTER — Emergency Department (HOSPITAL_COMMUNITY): Payer: Medicare Other

## 2014-06-07 ENCOUNTER — Encounter (HOSPITAL_COMMUNITY): Payer: Self-pay | Admitting: Emergency Medicine

## 2014-06-07 DIAGNOSIS — Z87891 Personal history of nicotine dependence: Secondary | ICD-10-CM | POA: Insufficient documentation

## 2014-06-07 DIAGNOSIS — J449 Chronic obstructive pulmonary disease, unspecified: Secondary | ICD-10-CM | POA: Diagnosis not present

## 2014-06-07 DIAGNOSIS — N23 Unspecified renal colic: Secondary | ICD-10-CM | POA: Insufficient documentation

## 2014-06-07 DIAGNOSIS — N132 Hydronephrosis with renal and ureteral calculous obstruction: Secondary | ICD-10-CM | POA: Diagnosis not present

## 2014-06-07 DIAGNOSIS — Z8719 Personal history of other diseases of the digestive system: Secondary | ICD-10-CM | POA: Insufficient documentation

## 2014-06-07 DIAGNOSIS — Z9981 Dependence on supplemental oxygen: Secondary | ICD-10-CM | POA: Diagnosis not present

## 2014-06-07 DIAGNOSIS — R109 Unspecified abdominal pain: Secondary | ICD-10-CM

## 2014-06-07 DIAGNOSIS — I2510 Atherosclerotic heart disease of native coronary artery without angina pectoris: Secondary | ICD-10-CM | POA: Insufficient documentation

## 2014-06-07 DIAGNOSIS — N400 Enlarged prostate without lower urinary tract symptoms: Secondary | ICD-10-CM | POA: Insufficient documentation

## 2014-06-07 DIAGNOSIS — Z7951 Long term (current) use of inhaled steroids: Secondary | ICD-10-CM | POA: Diagnosis not present

## 2014-06-07 DIAGNOSIS — N134 Hydroureter: Secondary | ICD-10-CM | POA: Diagnosis not present

## 2014-06-07 DIAGNOSIS — Z7982 Long term (current) use of aspirin: Secondary | ICD-10-CM | POA: Insufficient documentation

## 2014-06-07 DIAGNOSIS — Z7902 Long term (current) use of antithrombotics/antiplatelets: Secondary | ICD-10-CM | POA: Diagnosis not present

## 2014-06-07 DIAGNOSIS — H269 Unspecified cataract: Secondary | ICD-10-CM | POA: Diagnosis not present

## 2014-06-07 DIAGNOSIS — Z79899 Other long term (current) drug therapy: Secondary | ICD-10-CM | POA: Diagnosis not present

## 2014-06-07 DIAGNOSIS — E785 Hyperlipidemia, unspecified: Secondary | ICD-10-CM | POA: Diagnosis not present

## 2014-06-07 DIAGNOSIS — Z95810 Presence of automatic (implantable) cardiac defibrillator: Secondary | ICD-10-CM | POA: Insufficient documentation

## 2014-06-07 DIAGNOSIS — I509 Heart failure, unspecified: Secondary | ICD-10-CM | POA: Insufficient documentation

## 2014-06-07 DIAGNOSIS — E119 Type 2 diabetes mellitus without complications: Secondary | ICD-10-CM | POA: Diagnosis not present

## 2014-06-07 DIAGNOSIS — Z862 Personal history of diseases of the blood and blood-forming organs and certain disorders involving the immune mechanism: Secondary | ICD-10-CM | POA: Insufficient documentation

## 2014-06-07 DIAGNOSIS — I252 Old myocardial infarction: Secondary | ICD-10-CM | POA: Diagnosis not present

## 2014-06-07 DIAGNOSIS — K8020 Calculus of gallbladder without cholecystitis without obstruction: Secondary | ICD-10-CM | POA: Diagnosis not present

## 2014-06-07 LAB — COMPREHENSIVE METABOLIC PANEL
ALBUMIN: 4 g/dL (ref 3.5–5.0)
ALT: 16 U/L — ABNORMAL LOW (ref 17–63)
AST: 16 U/L (ref 15–41)
Alkaline Phosphatase: 49 U/L (ref 38–126)
Anion gap: 7 (ref 5–15)
BUN: 32 mg/dL — ABNORMAL HIGH (ref 6–20)
CHLORIDE: 105 mmol/L (ref 101–111)
CO2: 25 mmol/L (ref 22–32)
CREATININE: 1.22 mg/dL (ref 0.61–1.24)
Calcium: 8.6 mg/dL — ABNORMAL LOW (ref 8.9–10.3)
GFR, EST AFRICAN AMERICAN: 60 mL/min — AB (ref >60)
GFR, EST NON AFRICAN AMERICAN: 51 mL/min — AB (ref >60)
Glucose, Bld: 104 mg/dL — ABNORMAL HIGH (ref 70–99)
POTASSIUM: 4.7 mmol/L (ref 3.5–5.1)
Sodium: 137 mmol/L (ref 135–145)
Total Bilirubin: 0.6 mg/dL (ref 0.3–1.2)
Total Protein: 6.9 g/dL (ref 6.5–8.1)

## 2014-06-07 LAB — URINALYSIS, ROUTINE W REFLEX MICROSCOPIC
Glucose, UA: NEGATIVE mg/dL
KETONES UR: NEGATIVE mg/dL
NITRITE: NEGATIVE
PROTEIN: 100 mg/dL — AB
Specific Gravity, Urine: 1.018 (ref 1.005–1.030)
UROBILINOGEN UA: 0.2 mg/dL (ref 0.0–1.0)
pH: 5 (ref 5.0–8.0)

## 2014-06-07 LAB — URINE MICROSCOPIC-ADD ON

## 2014-06-07 LAB — CBC WITH DIFFERENTIAL/PLATELET
BASOS ABS: 0 10*3/uL (ref 0.0–0.1)
Basophils Relative: 0 % (ref 0–1)
EOS ABS: 0.1 10*3/uL (ref 0.0–0.7)
EOS PCT: 2 % (ref 0–5)
HEMATOCRIT: 31 % — AB (ref 39.0–52.0)
Hemoglobin: 9.9 g/dL — ABNORMAL LOW (ref 13.0–17.0)
Lymphocytes Relative: 13 % (ref 12–46)
Lymphs Abs: 0.9 10*3/uL (ref 0.7–4.0)
MCH: 21.6 pg — AB (ref 26.0–34.0)
MCHC: 31.9 g/dL (ref 30.0–36.0)
MCV: 67.7 fL — AB (ref 78.0–100.0)
Monocytes Absolute: 0.2 10*3/uL (ref 0.1–1.0)
Monocytes Relative: 3 % (ref 3–12)
Neutro Abs: 5.5 10*3/uL (ref 1.7–7.7)
Neutrophils Relative %: 82 % — ABNORMAL HIGH (ref 43–77)
PLATELETS: 111 10*3/uL — AB (ref 150–400)
RBC: 4.58 MIL/uL (ref 4.22–5.81)
RDW: 17.7 % — AB (ref 11.5–15.5)
WBC: 6.7 10*3/uL (ref 4.0–10.5)

## 2014-06-07 MED ORDER — CIPROFLOXACIN HCL 500 MG PO TABS: 500.0000 mg | ORAL_TABLET | Freq: Two times a day (BID) | ORAL | Status: DC

## 2014-06-07 MED ORDER — DEXTROSE 5 % IV SOLN
1.0000 g | Freq: Once | INTRAVENOUS | Status: AC
  Administered 2014-06-07: 1 g via INTRAVENOUS
  Filled 2014-06-07: qty 10

## 2014-06-07 MED ORDER — TAMSULOSIN HCL 0.4 MG PO CAPS: 0.4000 mg | ORAL_CAPSULE | Freq: Every day | ORAL | Status: DC

## 2014-06-07 MED ORDER — HYDROCODONE-ACETAMINOPHEN 2.5-500 MG PO TABS: 1.0000 | ORAL_TABLET | Freq: Four times a day (QID) | ORAL | Status: DC | PRN

## 2014-06-07 MED ORDER — ACETAMINOPHEN 325 MG PO TABS
650.0000 mg | ORAL_TABLET | Freq: Once | ORAL | Status: AC
  Administered 2014-06-07: 650 mg via ORAL
  Filled 2014-06-07: qty 2

## 2014-06-07 MED ORDER — SODIUM CHLORIDE 0.9 % IV BOLUS (SEPSIS)
1000.0000 mL | Freq: Once | INTRAVENOUS | Status: AC
Start: 2014-06-07 — End: 2014-06-07
  Administered 2014-06-07: 1000 mL via INTRAVENOUS

## 2014-06-07 NOTE — Discharge Instructions (Signed)
Take tylenol for pain.   Take vicodin for severe pain.   Take flomax daily for 2 weeks.   See [facility].   Return to ER if he has severe pain, vomiting, fevers.

## 2014-06-07 NOTE — ED Notes (Signed)
Pt states that he was sitting on the couch this afternoon when his right flank pain started. Pt self caths and hasnt noticed any blood, or problems with his urine.

## 2014-06-07 NOTE — ED Provider Notes (Signed)
CSN: [id number]     Arrival date & time 06/07/14  1819 History   First MD Initiated Contact with Patient 06/07/14 1934     Chief Complaint  Patient presents with  . Flank Pain     (Consider location/radiation/quality/duration/timing/severity/associated sxs/prior Treatment) The history is provided by the patient.  [name] [name] is a 81 y.o. male history CAD, CHF, perforated diverticulitis, diabetes here presenting with right-sided flank pain. Started 2 hours prior to arrival. He states that is a sharp stabbing pain. He usually self caths due to his BPH but denies blood in his urine. He has history of kidney stones in the past as well as perforated diverticulitis. Denies any vomiting or constipation or diarrhea.    Past Medical History  Diagnosis Date  . CAD (coronary artery disease)   . Ischemic cardiomyopathy   . CHF (congestive heart failure)   . Dyslipidemia   . Diverticulosis of colon (without mention of hemorrhage)   . Diabetes mellitus   . Anemia, unspecified   . Unspecified disorder resulting from impaired renal function   . Chronic airway obstruction, not elsewhere classified     on o2 at night  . Prostatic hypertrophy     hx of  . Retention of urine, unspecified   . ICD (implantable cardiac defibrillator) in place     st jude  . Emphysema   . Hemorrhoids   . MI, old 2010 or 2011  . Macular degeneration   . Cataracts, bilateral    Past Surgical History  Procedure Laterality Date  . Pci      4/11  . Cardiac defibrillator placement  2011    st jude  . Pacemaker insertion  2011  . Tonsillectomy  81 yo  . Cataract extraction     Family History  Problem Relation Age of Onset  . Lung cancer Father     smoker  . Coronary artery disease Mother   . Stroke Mother   . Breast cancer Sister   . Atrial fibrillation Daughter   . Congestive Heart Failure Mother    History  Substance Use Topics  . Smoking status: Former Smoker    Types: Cigarettes    Quit date:  04/27/2009  . Smokeless tobacco: Never Used  . Alcohol Use: Yes     Comment: occasional beer    Review of Systems  Genitourinary: Positive for flank pain.  All other systems reviewed and are negative.     Allergies  Review of patient's allergies indicates no known allergies.  Home Medications   Prior to Admission medications   Medication Sig Start Date End Date Taking? Authorizing Provider  albuterol (PROVENTIL) (5 MG/ML) 0.5% nebulizer solution Take 2.5 mg by nebulization every 6 (six) hours as needed for wheezing or shortness of breath.   Yes Historical Provider, MD  ALPRAZolam Duanne Moron) 0.5 MG tablet Take 0.5 mg by mouth at bedtime as needed for sleep.    Yes Historical Provider, MD  arformoterol (BROVANA) 15 MCG/2ML NEBU Take 2 mLs (15 mcg total) by nebulization 2 (two) times daily. 08/31/12  Yes Tammy S Parrett, NP  aspirin EC 81 MG tablet Take 1 tablet (81 mg total) by mouth at bedtime. 11/27/13  Yes Nishant Dhungel, MD  bisacodyl (DULCOLAX) 5 MG EC tablet Take 5 mg by mouth daily as needed for mild constipation or moderate constipation.   Yes Historical Provider, MD  budesonide (PULMICORT) 0.5 MG/2ML nebulizer solution Take 2 mLs (0.5 mg total) by nebulization 2 (two) times daily.  08/31/12  Yes Tammy S Parrett, NP  clopidogrel (PLAVIX) 75 MG tablet Take 1 tablet (75 mg total) by mouth every morning. 02/02/14  Yes Josue Hector, MD  CRESTOR 5 MG tablet Take 1 tablet by mouth every morning. 05/01/14  Yes Historical Provider, MD  famotidine (PEPCID) 20 MG tablet Take 20 mg by mouth at bedtime.  08/11/12  Yes Historical Provider, MD  losartan (COZAAR) 25 MG tablet TAKE 1 TABLET ONCE DAILY. 06/07/14  Yes Josue Hector, MD  metoprolol tartrate (LOPRESSOR) 25 MG tablet Take 12.5 mg by mouth 2 (two) times daily.    Yes Historical Provider, MD  HYDROcodone-acetaminophen (VICODIN) 2.5-500 MG per tablet Take 1 tablet by mouth every 6 (six) hours as needed for pain. Patient not taking: Reported on  04/17/2014 11/21/13   Nishant Dhungel, MD  nitroGLYCERIN (NITROSTAT) 0.4 MG SL tablet Place 1 tablet (0.4 mg total) under the tongue every 5 (five) minutes as needed for chest pain. 06/16/13 02/26/15  Thompson Grayer, MD  tamsulosin (FLOMAX) 0.4 MG CAPS capsule Take 1 capsule (0.4 mg total) by mouth daily. Patient not taking: Reported on 06/07/2014 07/04/13   [name], MD   BP 139/62 mmHg  Pulse 64  Temp(Src) 97.8 F (36.6 C) (Oral)  Resp 18  SpO2 94% Physical Exam  Constitutional: He is oriented to person, place, and time. He appears well-developed.  HENT:  Head: Normocephalic.  Mouth/Throat: Oropharynx is clear and moist.  Eyes: Conjunctivae and EOM are normal. Pupils are equal, round, and reactive to light.  Neck: Normal range of motion. Neck supple.  Cardiovascular: Normal rate, regular rhythm and normal heart sounds.   Pulmonary/Chest: Effort normal and breath sounds normal. No respiratory distress. He has no wheezes. He has no rales.  Abdominal: Soft. Bowel sounds are normal. He exhibits no distension. There is no tenderness. There is no rebound.  R flank pain   Musculoskeletal: Normal range of motion. He exhibits no edema or tenderness.  Neurological: He is alert and oriented to person, place, and time. No cranial nerve deficit. Coordination normal.  Skin: Skin is warm and dry.  Psychiatric: He has a normal mood and affect. His behavior is normal. Judgment and thought content normal.  Nursing note and vitals reviewed.   ED Course  Procedures (including critical care time) Labs Review Labs Reviewed  URINALYSIS, ROUTINE W REFLEX MICROSCOPIC - Abnormal; Notable for the following:    Color, Urine RED (*)    APPearance TURBID (*)    Hgb urine dipstick LARGE (*)    Bilirubin Urine MODERATE (*)    Protein, ur 100 (*)    Leukocytes, UA SMALL (*)    All other components within normal limits  CBC WITH DIFFERENTIAL/PLATELET - Abnormal; Notable for the following:    Hemoglobin 9.9  (*)    HCT 31.0 (*)    MCV 67.7 (*)    MCH 21.6 (*)    RDW 17.7 (*)    Platelets 111 (*)    Neutrophils Relative % 82 (*)    All other components within normal limits  COMPREHENSIVE METABOLIC PANEL - Abnormal; Notable for the following:    Glucose, Bld 104 (*)    BUN 32 (*)    Calcium 8.6 (*)    ALT 16 (*)    GFR calc non Af Amer 51 (*)    GFR calc Af Amer 60 (*)    All other components within normal limits  URINE MICROSCOPIC-ADD ON - Abnormal; Notable for the  following:    Bacteria, UA FEW (*)    All other components within normal limits    Imaging Review Ct Abdomen Pelvis Wo Contrast  06/07/2014   CLINICAL DATA:  Right flank and back pain for 1 day.  EXAM: CT ABDOMEN AND PELVIS WITHOUT CONTRAST  TECHNIQUE: Multidetector CT imaging of the abdomen and pelvis was performed following the standard protocol without IV contrast.  COMPARISON:  11/13/2013  FINDINGS: There is right hydronephrosis and proximal hydroureter due to a 3 mm obstructing calculus at the L5 level. No other urinary calculi are evident. Multiple cysts are again evident about the kidneys, as seen on multiple prior studies.  There is cholelithiasis, with several gallbladder calculi measuring up to 7 mm.  There is extensive colonic diverticulosis.  There are normal unenhanced appearances of the liver, spleen, pancreas and adrenals. The abdominal aorta is heavily calcified but normal in caliber.  No significant abnormality is evident in the upper chest.  There is severe lumbar degenerative disc and facet change. There is degenerative appearing grade 1 retrolisthesis at L3-4. There is no significant skeletal lesion.  IMPRESSION: 1. Obstructing 3 mm right ureteral calculus at the L5 level with proximal hydroureter and hydronephrosis. 2. Cholelithiasis 3. Diverticulosis 4. Severe lumbar degenerative disc and facet changes.   Electronically Signed   By: Andreas Newport M.D.   On: 06/07/2014 21:47     EKG Interpretation None       MDM   Final diagnoses:  Rt flank pain    [name] [name] is a 81 y.o. male here with R flank pain. Likely stone vs pyelo. Will get labs, UA, CT ab/pel.   10:21 PM UA large blood ? UTI. CT showed 3 mm stone with hydro. Pain controlled with tylenol alone. Given ceftriaxone, will dc with vicodin prn, flomax, cipro, urology f/u.    Wandra Arthurs, MD 06/07/14 2223

## 2014-06-07 NOTE — ED Notes (Signed)
Family at bedside. givien warm blanket

## 2014-06-07 NOTE — ED Notes (Signed)
Pain x 3 hours today, progressing. States he has had pain in the past when he was diagnosed with a UTI.

## 2014-06-08 ENCOUNTER — Telehealth: Payer: Self-pay | Admitting: *Deleted

## 2014-06-08 NOTE — Telephone Encounter (Signed)
Rx writtten as HYDROcodone-acetaminophen (VICODIN) 2.5-500 MG per tablet is no longer available as that dose.  Pharmacy called to change to 5-300MG  with instructions of taking half pill q6.

## 2014-06-11 LAB — URINE CULTURE: Colony Count: >=100000

## 2014-06-12 ENCOUNTER — Telehealth (HOSPITAL_COMMUNITY): Payer: Self-pay

## 2014-06-12 NOTE — ED Notes (Signed)
Post ED Visit - Positive Culture Follow-up: Chart Hand-off to ED Flow Manager  Culture assessed and recommendations reviewed by: []  Wes Dulaney, Pharm.D., BCPS []  Heide Guile, Pharm.D., BCPS []  Alycia Rossetti, Pharm.D., BCPS []  [place], [place].D., BCPS, AAHIVP []  Legrand Como, Pharm .D., BCPS, AAHIVP []  Elicia Lamp, Pharm.D.  Positive urine culture  []  Patient discharged without antimicrobial prescription and treatment is now indicated [x]  Organism is resistant to prescribed ED discharge antimicrobial []  Patient with positive blood cultures  Changes discussed with ED provider: Alyse Low New antibiotic prescription stop cipro and start amoxicillin 500mg  po bid x7 days.  \ Spoke with pt. States he is feeling fine and does not want any medication called in at this time   Ileene Musa 06/12/2014, 12:03 PM

## 2014-06-12 NOTE — Progress Notes (Signed)
ED Antimicrobial Stewardship Positive Culture Follow Up   [name] [name] is an 81 y.o. male who presented to [facility] on 06/07/2014 with a chief complaint of right flank pain.  Pt self caths at home, h/o kidney stones.  W/u for kidney stone vs pyelo.  Chief Complaint  Patient presents with  . Flank Pain    Recent Results (from the past 720 hour(s))  Urine culture     Status: None   Collection Time: 06/07/14  8:18 PM  Result Value Ref Range Status   Specimen Description URINE, CATHETERIZED  Final   Special Requests NONE  Final   Colony Count   Final    >=100,000 COLONIES/ML Performed at [facility]    Culture   Final    ENTEROCOCCUS SPECIES Performed at [facility]    Report Status 06/11/2014 FINAL  Final   Organism ID, Bacteria ENTEROCOCCUS SPECIES  Final      Susceptibility   Enterococcus species - KIRBY BAUER*    AMPICILLIN SENSITIVE Sensitive     LEVOFLOXACIN INTERMEDIATE Intermediate     NITROFURANTOIN SENSITIVE Sensitive     VANCOMYCIN SENSITIVE Sensitive     * ENTEROCOCCUS SPECIES    [x]  Treated with Cipro, organism intermediate to prescribed antimicrobial   New antibiotic prescription: Stop Cipro Amoxicillin 500mg  BID x 7 days  ED Provider: Alyse Low, PA-C   Flora Ratz K 06/12/2014, 9:12 AM Infectious Diseases Pharmacist Phone# [phone]

## 2014-06-19 ENCOUNTER — Encounter: Payer: Medicare Other | Admitting: Internal Medicine

## 2014-07-17 ENCOUNTER — Encounter: Payer: Medicare Other | Admitting: Internal Medicine

## 2014-07-19 ENCOUNTER — Other Ambulatory Visit: Payer: Self-pay | Admitting: Internal Medicine

## 2014-08-01 ENCOUNTER — Other Ambulatory Visit: Payer: Self-pay | Admitting: Cardiovascular Disease

## 2014-08-03 ENCOUNTER — Other Ambulatory Visit: Payer: Self-pay

## 2014-08-03 MED ORDER — LOSARTAN POTASSIUM 25 MG PO TABS: 25.0000 mg | ORAL_TABLET | Freq: Every day | ORAL | Status: DC

## 2014-08-28 ENCOUNTER — Encounter: Payer: Self-pay | Admitting: Internal Medicine

## 2014-08-28 ENCOUNTER — Other Ambulatory Visit: Payer: Self-pay

## 2014-08-28 ENCOUNTER — Ambulatory Visit (INDEPENDENT_AMBULATORY_CARE_PROVIDER_SITE_OTHER): Payer: Medicare Other | Admitting: Internal Medicine

## 2014-08-28 VITALS — BP 110/50 | HR 75 | Ht 72.0 in | Wt 180.4 lb

## 2014-08-28 DIAGNOSIS — Z95810 Presence of automatic (implantable) cardiac defibrillator: Secondary | ICD-10-CM

## 2014-08-28 DIAGNOSIS — I5022 Chronic systolic (congestive) heart failure: Secondary | ICD-10-CM

## 2014-08-28 DIAGNOSIS — I255 Ischemic cardiomyopathy: Secondary | ICD-10-CM

## 2014-08-28 LAB — CUP PACEART INCLINIC DEVICE CHECK
Battery Remaining Longevity: 68.4 mo
Brady Statistic RA Percent Paced: 0.12 %
HighPow Impedance: 74.25 Ohm
Lead Channel Impedance Value: 387.5 Ohm
Lead Channel Impedance Value: 400 Ohm
Lead Channel Pacing Threshold Pulse Width: 0.5 ms
Lead Channel Sensing Intrinsic Amplitude: 10.2 mV
Lead Channel Sensing Intrinsic Amplitude: 3.2 mV
Lead Channel Setting Pacing Amplitude: 2 V
Lead Channel Setting Pacing Amplitude: 2.5 V
Lead Channel Setting Pacing Pulse Width: 0.5 ms
Lead Channel Setting Sensing Sensitivity: 0.5 mV
MDC IDC MSMT LEADCHNL RA PACING THRESHOLD AMPLITUDE: 0.5 V
MDC IDC MSMT LEADCHNL RA PACING THRESHOLD PULSEWIDTH: 0.5 ms
MDC IDC MSMT LEADCHNL RV PACING THRESHOLD AMPLITUDE: 1 V
MDC IDC SESS DTM: 20160801173715
MDC IDC SET ZONE DETECTION INTERVAL: 335 ms
MDC IDC STAT BRADY RV PERCENT PACED: 0.05 %
Pulse Gen Serial Number: 623396
Zone Setting Detection Interval: 270 ms

## 2014-08-28 NOTE — Patient Instructions (Signed)
Medication Instructions:  Your physician recommends that you continue on your current medications as directed. Please refer to the Current Medication list given to you today.   Labwork: None ordered  Testing/Procedures: None ordered  Follow-Up: Your physician wants you to follow-up in: 12 months with Dr Rayann Heman Dennis Bast will receive a reminder letter in the mail two months in advance. If you don't receive a letter, please call our office to schedule the follow-up appointment.  ICM--will have nurse call you with date  Remote monitoring is used to monitor your  ICD from home. This monitoring reduces the number of office visits required to check your device to one time per year. It allows [place] to keep an eye on the functioning of your device to ensure it is working properly. You are scheduled for a device check from home on 11/27/14. You may send your transmission at any time that day. If you have a wireless device, the transmission will be sent automatically. After your physician reviews your transmission, you will receive a postcard with your next transmission date.     Any Other Special Instructions Will Be Listed Below (If Applicable).

## 2014-08-28 NOTE — Progress Notes (Signed)
PCP:  [name], MD Primary Cardiologist:  [name]  The patient presents today for routine electrophysiology followup.  Since last being seen in our clinic, the patient reports doing reasonably well.  He has ongoing difficulty with chronic dyspnea.  He was hospitalized in October but has made significant improvement since that time. Today, he denies symptoms of palpitations, chest pain, lower extremity edema, dizziness, presyncope, syncope, or ICD shocks.  The patient feels that he is tolerating medications without difficulties and is otherwise without complaint today.   Past Medical History  Diagnosis Date  . CAD (coronary artery disease)   . Ischemic cardiomyopathy   . CHF (congestive heart failure)   . Dyslipidemia   . Diverticulosis of colon (without mention of hemorrhage)   . Diabetes mellitus   . Anemia, unspecified   . Unspecified disorder resulting from impaired renal function   . Chronic airway obstruction, not elsewhere classified     on o2 at night  . Prostatic hypertrophy     hx of  . Retention of urine, unspecified   . ICD (implantable cardiac defibrillator) in place     st jude  . Emphysema   . Hemorrhoids   . MI, old 2010 or 2011  . Macular degeneration   . Cataracts, bilateral    Past Surgical History  Procedure Laterality Date  . Pci      4/11  . Cardiac defibrillator placement  2011    st jude  . Pacemaker insertion  2011  . Tonsillectomy  81 yo  . Cataract extraction      Current Outpatient Prescriptions  Medication Sig Dispense Refill  . acetaminophen-codeine (TYLENOL #3) 300-30 MG per tablet Take 1 tablet by mouth daily as needed. Pain    . albuterol (PROVENTIL) (5 MG/ML) 0.5% nebulizer solution Take 2.5 mg by nebulization every 6 (six) hours as needed for wheezing or shortness of breath.    . ALPRAZolam (XANAX) 0.5 MG tablet Take 0.5 mg by mouth at bedtime as needed for sleep.     Marland Kitchen arformoterol (BROVANA) 15 MCG/2ML NEBU Take 2 mLs (15 mcg total)  by nebulization 2 (two) times daily. 120 mL 3  . aspirin EC 81 MG tablet Take 1 tablet (81 mg total) by mouth at bedtime. 30 tablet 0  . bisacodyl (DULCOLAX) 5 MG EC tablet Take 5 mg by mouth daily as needed for mild constipation or moderate constipation.    . budesonide (PULMICORT) 0.5 MG/2ML nebulizer solution Take 2 mLs (0.5 mg total) by nebulization 2 (two) times daily. 120 mL 3  . clopidogrel (PLAVIX) 75 MG tablet Take 1 tablet (75 mg total) by mouth every morning. 30 tablet 5  . CRESTOR 5 MG tablet TAKE (1) TABLET DAILY IN THE MORNING. (Patient taking differently: TAKE (1) TABLET BY MOUTH DAILY IN THE MORNING.) 30 tablet 2  . famotidine (PEPCID) 20 MG tablet Take 20 mg by mouth at bedtime.     Marland Kitchen HYDROcodone-acetaminophen (VICODIN) 2.5-500 MG per tablet Take 1 tablet by mouth every 6 (six) hours as needed for pain. 30 tablet 0  . losartan (COZAAR) 25 MG tablet Take 1 tablet (25 mg total) by mouth daily. 30 tablet 0  . nitroGLYCERIN (NITROSTAT) 0.4 MG SL tablet Place 1 tablet (0.4 mg total) under the tongue every 5 (five) minutes as needed for chest pain. 25 tablet 4  . metoprolol tartrate (LOPRESSOR) 25 MG tablet Take 12.5 mg by mouth 2 (two) times daily.      No current  facility-administered medications for this visit.    No Known Allergies  History   Social History  . Marital Status: Married    Spouse Name: N/A  . Number of Children: N/A  . Years of Education: N/A   Occupational History  . dentist    Social History Main Topics  . Smoking status: Former Smoker    Types: Cigarettes    Quit date: 04/27/2009  . Smokeless tobacco: Never Used  . Alcohol Use: Yes     Comment: occasional beer  . Drug Use: No  . Sexual Activity: No   Other Topics Concern  . Not on file   Social History Narrative   Lives in a house with stairs, which he needs to use, with his wife.  Wife is not able-bodied.  Does not use a cane or walker, but is unsteady on his feet.        [name] Yeung:   [phone] cell, [phone], daughter, POA.     Boyd Kerbs:  [phone], nephew   Juluis Pitch:  [phone], daughter             Family History  Problem Relation Age of Onset  . Lung cancer Father     smoker  . Coronary artery disease Mother   . Stroke Mother   . Breast cancer Sister   . Atrial fibrillation Daughter   . Congestive Heart Failure Mother     Physical Exam: Filed Vitals:   08/28/14 1549  BP: 110/50  Pulse: 75  Height: 6' (1.829 m)  Weight: 81.829 kg (180 lb 6.4 oz)    GEN- The patient is elderly appearing, alert and oriented x 3 today.   Head- normocephalic, atraumatic Eyes-  Sclera clear, conjunctiva pink Ears- hearing intact Oropharynx- clear Neck- supple  Lungs- diffuse expiratory wheezes with occasional purse lips breathing Chest- ICD pocket is well healed Heart- Regular rate and rhythm, no murmurs, rubs or gallops, PMI not laterally displaced GI- soft, NT, ND, + BS Extremities- no clubbing, cyanosis, or edemat  ICD interrogation- reviewed in detail today,  See PACEART report  Assessment and Plan:  1. Ischemic CM/ chronic systolic dysfunction Normal ICD function See Pace Art report No changes today He has not been compliant with remote monitoring recently and has sent his monitor back to the company.  The importance of remote monitoring was stressed today.  He says that he is willing to give this another try. He is willing to enroll in the [facility] clinc for hemodynamic monitoring  Follow-up with [name] at the next available time.  EP NP to see in 1 year

## 2014-09-04 ENCOUNTER — Other Ambulatory Visit: Payer: Self-pay | Admitting: Cardiovascular Disease

## 2014-09-21 ENCOUNTER — Other Ambulatory Visit: Payer: Self-pay | Admitting: Cardiovascular Disease

## 2014-10-05 ENCOUNTER — Other Ambulatory Visit: Payer: Self-pay | Admitting: Cardiovascular Disease

## 2014-10-09 DIAGNOSIS — E119 Type 2 diabetes mellitus without complications: Secondary | ICD-10-CM | POA: Diagnosis not present

## 2014-10-09 DIAGNOSIS — J449 Chronic obstructive pulmonary disease, unspecified: Secondary | ICD-10-CM | POA: Diagnosis not present

## 2014-10-09 DIAGNOSIS — J9611 Chronic respiratory failure with hypoxia: Secondary | ICD-10-CM | POA: Diagnosis not present

## 2014-10-09 DIAGNOSIS — N183 Chronic kidney disease, stage 3 (moderate): Secondary | ICD-10-CM | POA: Diagnosis not present

## 2014-10-09 DIAGNOSIS — I252 Old myocardial infarction: Secondary | ICD-10-CM | POA: Diagnosis not present

## 2014-10-09 DIAGNOSIS — I10 Essential (primary) hypertension: Secondary | ICD-10-CM | POA: Diagnosis not present

## 2014-10-09 DIAGNOSIS — Z1389 Encounter for screening for other disorder: Secondary | ICD-10-CM | POA: Diagnosis not present

## 2014-10-09 DIAGNOSIS — Z23 Encounter for immunization: Secondary | ICD-10-CM | POA: Diagnosis not present

## 2014-10-09 DIAGNOSIS — E782 Mixed hyperlipidemia: Secondary | ICD-10-CM | POA: Diagnosis not present

## 2014-10-09 DIAGNOSIS — I2510 Atherosclerotic heart disease of native coronary artery without angina pectoris: Secondary | ICD-10-CM | POA: Diagnosis not present

## 2014-10-09 DIAGNOSIS — D696 Thrombocytopenia, unspecified: Secondary | ICD-10-CM | POA: Diagnosis not present

## 2014-10-09 DIAGNOSIS — Z95810 Presence of automatic (implantable) cardiac defibrillator: Secondary | ICD-10-CM | POA: Diagnosis not present

## 2014-10-12 ENCOUNTER — Telehealth: Payer: Self-pay | Admitting: Internal Medicine

## 2014-10-12 ENCOUNTER — Ambulatory Visit (INDEPENDENT_AMBULATORY_CARE_PROVIDER_SITE_OTHER): Payer: Medicare Other

## 2014-10-12 DIAGNOSIS — Z95810 Presence of automatic (implantable) cardiac defibrillator: Secondary | ICD-10-CM

## 2014-10-12 DIAGNOSIS — I5022 Chronic systolic (congestive) heart failure: Secondary | ICD-10-CM

## 2014-10-12 NOTE — Telephone Encounter (Signed)
New message ° ° ° ° ° ° °Did we get his remote transmission? °

## 2014-10-12 NOTE — Telephone Encounter (Signed)
Informed pt that his transmission was received.  

## 2014-10-13 NOTE — Progress Notes (Signed)
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 Gained more than 2 pounds in a day or more than 5 pounds in a week? no  2. Had changes in your medications (with verification of current medications)? no  3. Had more shortness of breath than is usual for you? no  4. Limited your activity because of shortness of breath? no  5. Not been able to sleep because of shortness of breath? no  6. Had increased swelling in your feet or ankles? no  7. Had symptoms of dehydration (dizziness, dry mouth, increased thirst, decreased urine output) no  8. Had changes in sodium restriction? no  9. Been compliant with medication? Yes   ICM trend:   Follow-up plan: ICM clinic phone appointment 11/20/2014.  No changes today.  Patient reported he is feeling fine.  Copy of note sent to patient's primary care physician, primary cardiologist, and device following physician.  Rosalene Billings, RN, CCM 10/13/2014 12:57 PM

## 2014-10-23 DIAGNOSIS — M542 Cervicalgia: Secondary | ICD-10-CM | POA: Diagnosis not present

## 2014-11-02 ENCOUNTER — Other Ambulatory Visit: Payer: Self-pay | Admitting: Cardiovascular Disease

## 2014-11-02 ENCOUNTER — Other Ambulatory Visit: Payer: Self-pay | Admitting: Internal Medicine

## 2014-11-20 ENCOUNTER — Ambulatory Visit (INDEPENDENT_AMBULATORY_CARE_PROVIDER_SITE_OTHER): Payer: Medicare Other | Admitting: *Deleted

## 2014-11-20 DIAGNOSIS — I255 Ischemic cardiomyopathy: Secondary | ICD-10-CM | POA: Diagnosis not present

## 2014-11-20 DIAGNOSIS — I5022 Chronic systolic (congestive) heart failure: Secondary | ICD-10-CM | POA: Diagnosis not present

## 2014-11-20 NOTE — Progress Notes (Signed)
Remote ICD transmission.   

## 2014-11-20 NOTE — Progress Notes (Signed)
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 Gained more than 2 pounds in a day or more than 5 pounds in a week? no  2. Had changes in your medications (with verification of current medications)? no  3. Had more shortness of breath than is usual for you? no  4. Limited your activity because of shortness of breath? no  5. Not been able to sleep because of shortness of breath? no  6. Had increased swelling in your feet or ankles? no  7. Had symptoms of dehydration (dizziness, dry mouth, increased thirst, decreased urine output) no  8. Had changes in sodium restriction? no  9. Been compliant with medication? Yes   ICM trend: 11/20/2014   Follow-up plan: ICM clinic phone appointment on 12/25/2014.  Corvue impedance trending along baseline.  Patient denied any HF symptoms.  He stated his main problem is neck pain and PCP is treating him for the problem.  No changes today.    Copy of note sent to patient's primary care physician, primary cardiologist, and device following physician.  Rosalene Billings, RN, CCM 11/20/2014 3:40 PM

## 2014-11-27 ENCOUNTER — Encounter: Payer: Medicare Other | Admitting: *Deleted

## 2014-11-29 ENCOUNTER — Telehealth: Payer: Self-pay | Admitting: Cardiovascular Disease

## 2014-11-29 NOTE — Telephone Encounter (Signed)
SPOKE  WITH PHARMACISTS  MESSAGE  IS  WRONG  NO  ISSUES WITH   MED  COST   MESSAGES WAS TAKEN IN ERROR PLAVIX IS ONLY  $5.00  PER MONTH .Adonis Housekeeper

## 2014-11-29 NOTE — Telephone Encounter (Signed)
Pharmacy stated that pt is requesting a alternative for Plavix, due to the cost. Please advise

## 2014-12-15 ENCOUNTER — Telehealth: Payer: Self-pay | Admitting: Internal Medicine

## 2014-12-15 NOTE — Telephone Encounter (Signed)
New message  4. Are you calling to see if we received your device transmission? Yes

## 2014-12-15 NOTE — Telephone Encounter (Signed)
Informed pt daughter that we received her transmission.

## 2014-12-25 ENCOUNTER — Ambulatory Visit (INDEPENDENT_AMBULATORY_CARE_PROVIDER_SITE_OTHER): Payer: Medicare Other

## 2014-12-25 DIAGNOSIS — I509 Heart failure, unspecified: Secondary | ICD-10-CM

## 2014-12-25 DIAGNOSIS — Z95810 Presence of automatic (implantable) cardiac defibrillator: Secondary | ICD-10-CM | POA: Diagnosis not present

## 2014-12-26 ENCOUNTER — Telehealth: Payer: Self-pay

## 2014-12-26 NOTE — Telephone Encounter (Signed)
ICM transmission received.  Attempted call and left message for return call.  

## 2014-12-27 NOTE — Progress Notes (Addendum)
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 Gained more than 2 pounds in a day or more than 5 pounds in a week? no  2. Had changes in your medications (with verification of current medications)? no  3. Had more shortness of breath than is usual for you? Yes, but does not think it is related to fluid retention.  He stated he breathing is usually not good.  4. Limited your activity because of shortness of breath? no  5. Not been able to sleep because of shortness of breath? no  6. Had increased swelling in your feet or ankles? no  7. Had symptoms of dehydration (dizziness, dry mouth, increased thirst, decreased urine output) no  8. Had changes in sodium restriction? no  9. Been compliant with medication? Yes   ICM trend:  12/25/2014   Follow-up plan: ICM clinic phone appointment 01/30/2015.  Corvue daily impedance below baseline 12/14/2014 to 12/17/2014 and 12/22/2014 to 12/24/2014 suggesting fluid retention.  Daily impedance returned to baseline.   He reported some shortness of breath.  Encouraged him to call if SOB does not improve or worsens.  Education to limit sodium intake daily.  No changes today.  Copy of note sent to patient's primary care physician, primary cardiologist, and device following physician.  Rosalene Billings, RN, CCM 12/27/2014 9:18 AM

## 2014-12-27 NOTE — Telephone Encounter (Signed)
Spoke with patient.

## 2015-01-04 ENCOUNTER — Telehealth: Payer: Self-pay | Admitting: Cardiology

## 2015-01-04 NOTE — Telephone Encounter (Signed)
LMOVM requesting pt to send manual transmission b/c his home monitor has not updated in at least 8 days.

## 2015-01-30 ENCOUNTER — Telehealth: Payer: Self-pay | Admitting: Cardiology

## 2015-01-30 NOTE — Telephone Encounter (Signed)
LMOVM reminding pt to send remote transmission.   

## 2015-02-02 ENCOUNTER — Telehealth: Payer: Self-pay

## 2015-02-02 NOTE — Telephone Encounter (Signed)
ICM transmission received.  Attempted call to patient and line busy x 2.  Corvue impedance below baseline from 01/19/2016 to 01/23/2016 suggesting fluid during that time.  Next ICM transmission scheduled for 02/27/2015 and patient letter sent.

## 2015-02-07 ENCOUNTER — Telehealth: Payer: Self-pay | Admitting: Cardiovascular Disease

## 2015-02-07 NOTE — Telephone Encounter (Signed)
New problem    Pt's daughter need to speak to you concerning pt having fluid around his heart,. Please call daughter

## 2015-02-08 NOTE — Telephone Encounter (Signed)
Returned telephone call to [employer] (dpr on file).  She asked about the [facility] letter that patient received and if there was fluid around his heart.  Explained in December he had a little fluid and 1/ 5 to 1/8 he may have had a little fluid at that time.  Reviewed HF symptoms and she stated he does not have any of those symptoms but he is always short of breath due to COPD.  Advised if the symptoms worsen to call the office.  Explained from 02/04/2015, the fluid levels looked more balanced.  Explained the device fluid readings is only a tool to used to help detect fluid early but it is important to monitor for symptoms.   She appreciated the call back and information.  I stated I have another ICM transmission scheduled for 02/27/2015.

## 2015-02-27 ENCOUNTER — Ambulatory Visit: Payer: Medicare Other | Admitting: *Deleted

## 2015-02-27 DIAGNOSIS — I509 Heart failure, unspecified: Secondary | ICD-10-CM

## 2015-02-27 DIAGNOSIS — I255 Ischemic cardiomyopathy: Secondary | ICD-10-CM | POA: Diagnosis not present

## 2015-02-27 DIAGNOSIS — Z95810 Presence of automatic (implantable) cardiac defibrillator: Secondary | ICD-10-CM

## 2015-02-27 NOTE — Progress Notes (Signed)
Remote ICD transmission.   

## 2015-02-28 ENCOUNTER — Telehealth: Payer: Self-pay

## 2015-02-28 NOTE — Telephone Encounter (Signed)
Remote ICM transmission received.  Attempted patient call and line busy 

## 2015-03-02 NOTE — Telephone Encounter (Signed)
Spoke with patient.

## 2015-03-07 NOTE — Progress Notes (Addendum)
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 Gained more than 2 pounds in a day or more than 5 pounds in a week? no  2. Had changes in your medications (with verification of current medications)? no  3. Had more shortness of breath than is usual for you? no  4. Limited your activity because of shortness of breath? no  5. Not been able to sleep because of shortness of breath? no  6. Had increased swelling in your feet or ankles? no  7. Had symptoms of dehydration (dizziness, dry mouth, increased thirst, decreased urine output) no  8. Had changes in sodium restriction? no  9. Been compliant with medication? Yes   ICM trend: 3 month view for 02/27/2015   ICM trend: 1 year view for 02/27/2015   Follow-up plan: ICM clinic phone appointment 04/12/2015 and office appointment with Dr Rayann Heman on 03/12/2015.  Corvue thoracic impedance trending along reference line.  Patient reported he is feeling fine and denied any fluid symptoms.  No changes today.    Copy of note sent to patient's primary care physician, primary cardiologist, and device following physician.  Rosalene Billings, RN, CCM 03/07/2015 8:06 AM

## 2015-03-12 ENCOUNTER — Ambulatory Visit (INDEPENDENT_AMBULATORY_CARE_PROVIDER_SITE_OTHER): Payer: Medicare Other | Admitting: Internal Medicine

## 2015-03-12 ENCOUNTER — Encounter: Payer: Self-pay | Admitting: Internal Medicine

## 2015-03-12 ENCOUNTER — Other Ambulatory Visit: Payer: Self-pay

## 2015-03-12 VITALS — BP 124/62 | HR 63 | Ht 71.0 in | Wt 178.6 lb

## 2015-03-12 DIAGNOSIS — I5022 Chronic systolic (congestive) heart failure: Secondary | ICD-10-CM | POA: Diagnosis not present

## 2015-03-12 DIAGNOSIS — I255 Ischemic cardiomyopathy: Secondary | ICD-10-CM | POA: Diagnosis not present

## 2015-03-12 LAB — CUP PACEART INCLINIC DEVICE CHECK
Brady Statistic RV Percent Paced: 0.12 %
Date Time Interrogation Session: 20170213175615
HIGH POWER IMPEDANCE MEASURED VALUE: 76.5 Ohm
Implantable Lead Implant Date: 20111117
Implantable Lead Location: 753859
Implantable Lead Location: 753860
Implantable Lead Model: 7122
Lead Channel Impedance Value: 387.5 Ohm
Lead Channel Pacing Threshold Amplitude: 0.5 V
Lead Channel Pacing Threshold Amplitude: 1 V
Lead Channel Sensing Intrinsic Amplitude: 3.5 mV
Lead Channel Sensing Intrinsic Amplitude: 9.8 mV
Lead Channel Setting Sensing Sensitivity: 0.5 mV
MDC IDC LEAD IMPLANT DT: 20111117
MDC IDC MSMT BATTERY REMAINING LONGEVITY: 62.4
MDC IDC MSMT LEADCHNL RA IMPEDANCE VALUE: 362.5 Ohm
MDC IDC MSMT LEADCHNL RA PACING THRESHOLD PULSEWIDTH: 0.5 ms
MDC IDC MSMT LEADCHNL RV PACING THRESHOLD PULSEWIDTH: 0.5 ms
MDC IDC SET LEADCHNL RA PACING AMPLITUDE: 2 V
MDC IDC SET LEADCHNL RV PACING AMPLITUDE: 2.5 V
MDC IDC SET LEADCHNL RV PACING PULSEWIDTH: 0.5 ms
MDC IDC STAT BRADY RA PERCENT PACED: 0.07 %
Pulse Gen Serial Number: 623396

## 2015-03-12 NOTE — Patient Instructions (Signed)
Medication Instructions:  Your physician recommends that you continue on your current medications as directed. Please refer to the Current Medication list given to you today.   Labwork: None ordered   Testing/Procedures: None ordered   Follow-Up: Your physician wants you to follow-up in: 12 months with Dr Rayann Heman Dennis Bast will receive a reminder letter in the mail two months in advance. If you don't receive a letter, please call our office to schedule the follow-up appointment.  Remote monitoring is used to monitor your  ICD from home. This monitoring reduces the number of office visits required to check your device to one time per year. It allows [place] to keep an eye on the functioning of your device to ensure it is working properly. You are scheduled for a device check from home on 06/11/15. You may send your transmission at any time that day. If you have a wireless device, the transmission will be sent automatically. After your physician reviews your transmission, you will receive a postcard with your next transmission date.     Any Other Special Instructions Will Be Listed Below (If Applicable).     If you need a refill on your cardiac medications before your next appointment, please call your pharmacy.

## 2015-03-12 NOTE — Progress Notes (Signed)
PCP:  [name], MD Primary Cardiologist:  [name]  The patient presents today for routine electrophysiology followup.  Since last being seen in our clinic, the patient reports doing reasonably well.   He has begun making copper jewelry.  His wife died in 09/03/22.  Today, he denies symptoms of palpitations, chest pain, lower extremity edema, dizziness, presyncope, syncope, or ICD shocks.  The patient feels that he is tolerating medications without difficulties and is otherwise without complaint today.   Past Medical History  Diagnosis Date  . CAD (coronary artery disease)   . Ischemic cardiomyopathy   . CHF (congestive heart failure) ([place])   . Dyslipidemia   . Diverticulosis of colon (without mention of hemorrhage)   . Diabetes mellitus   . Anemia, unspecified   . Unspecified disorder resulting from impaired renal function   . Chronic airway obstruction, not elsewhere classified     on o2 at night  . Prostatic hypertrophy     hx of  . Retention of urine, unspecified   . ICD (implantable cardiac defibrillator) in place     st jude  . Emphysema   . Hemorrhoids   . MI, old 2010 or 2011  . Macular degeneration   . Cataracts, bilateral    Past Surgical History  Procedure Laterality Date  . Pci      4/11  . Cardiac defibrillator placement  2011    st jude  . Pacemaker insertion  2011  . Tonsillectomy  82 yo  . Cataract extraction      Current Outpatient Prescriptions  Medication Sig Dispense Refill  . acetaminophen-codeine (TYLENOL #3) 300-30 MG per tablet Take 1 tablet by mouth daily as needed. Pain    . albuterol (PROVENTIL) (5 MG/ML) 0.5% nebulizer solution Take 2.5 mg by nebulization every 6 (six) hours as needed for wheezing or shortness of breath.    . ALPRAZolam (XANAX) 0.25 MG tablet Take 0.25 mg by mouth as needed. As needed for sleep    . arformoterol (BROVANA) 15 MCG/2ML NEBU Take 2 mLs (15 mcg total) by nebulization 2 (two) times daily. 120 mL 3  . aspirin EC 81  MG tablet Take 1 tablet (81 mg total) by mouth at bedtime. 30 tablet 0  . bisacodyl (DULCOLAX) 5 MG EC tablet Take 5 mg by mouth daily as needed for mild constipation or moderate constipation.    . budesonide (PULMICORT) 0.5 MG/2ML nebulizer solution Take 2 mLs (0.5 mg total) by nebulization 2 (two) times daily. 120 mL 3  . clopidogrel (PLAVIX) 75 MG tablet TAKE (1) TABLET DAILY IN THE MORNING. 30 tablet 11  . famotidine (PEPCID) 20 MG tablet Take 20 mg by mouth at bedtime.     Marland Kitchen HYDROcodone-acetaminophen (VICODIN) 2.5-500 MG per tablet Take 1 tablet by mouth every 6 (six) hours as needed for pain. 30 tablet 0  . losartan (COZAAR) 25 MG tablet TAKE 1 TABLET ONCE DAILY. 30 tablet 11  . metoprolol tartrate (LOPRESSOR) 25 MG tablet Take 12.5 mg by mouth 2 (two) times daily.     . rosuvastatin (CRESTOR) 5 MG tablet TAKE (1) TABLET DAILY IN THE MORNING. 30 tablet 11  . nitroGLYCERIN (NITROSTAT) 0.4 MG SL tablet Place 1 tablet (0.4 mg total) under the tongue every 5 (five) minutes as needed for chest pain. 25 tablet 4   No current facility-administered medications for this visit.    No Known Allergies  Social History   Social History  . Marital Status: Married  Spouse Name: N/A  . Number of Children: N/A  . Years of Education: N/A   Occupational History  . dentist    Social History Main Topics  . Smoking status: Former Smoker    Types: Cigarettes    Quit date: 04/27/2009  . Smokeless tobacco: Never Used  . Alcohol Use: Yes     Comment: occasional beer  . Drug Use: No  . Sexual Activity: No   Other Topics Concern  . Not on file   Social History Narrative   Lives in a house with stairs, which he needs to use, with his wife.  Wife is not able-bodied.  Does not use a cane or walker, but is unsteady on his feet.        [name] Dimmitt:  [phone] cell, [phone], daughter, POA.     Boyd [id number]  [phone], nephew   Juluis [id number]  [phone], daughter              Family History  Problem Relation Age of Onset  . Lung cancer Father     smoker  . Coronary artery disease Mother   . Stroke Mother   . Breast cancer Sister   . Atrial fibrillation Daughter   . Congestive Heart Failure Mother     Physical Exam: Filed Vitals:   03/12/15 1510  BP: 124/62  Pulse: 63  Height: 5\' 11"  (1.803 m)  Weight: 178 lb 9.6 oz (81.012 kg)    GEN- The patient is 82-year-old appearing, alert and oriented x 3 today.   Head- normocephalic, atraumatic Eyes-  Sclera clear, conjunctiva pink Ears- hearing intact Oropharynx- clear Neck- supple  Lungs- diffuse expiratory wheezes with occasional purse lips breathing Chest- ICD pocket is well healed Heart- Regular rate and rhythm, no murmurs, rubs or gallops, PMI not laterally displaced GI- soft, NT, ND, + BS Extremities- no clubbing, cyanosis, or edemat  ICD interrogation- reviewed in detail today,  See PACEART report  Assessment and Plan:  1. Ischemic CM/ chronic systolic dysfunction Normal ICD function See Pace Art report No changes today  I have discussed SJM Fortify Assura advisary with the patient today. He understands that recommendation from SJM is to not replace the device at this time. The patient is not device dependant.  The patient has had appropriate device therapy in the past or implanted for secondary prevention.  Vibratory alert demonstrated today.  He is actively remotely monitored and understands the importance of compliance today.  He and I discussed this issue at length and we are clear in our decision to follow his device conservatively rather than replace it at this time.   Follow-up with [name] as scheduled  [facility] ICM device clinic Return to see me in 1 year  Thompson Grayer MD, [facility] 03/12/2015 3:34 PM

## 2015-03-14 DIAGNOSIS — I2510 Atherosclerotic heart disease of native coronary artery without angina pectoris: Secondary | ICD-10-CM | POA: Diagnosis not present

## 2015-03-14 DIAGNOSIS — J9611 Chronic respiratory failure with hypoxia: Secondary | ICD-10-CM | POA: Diagnosis not present

## 2015-03-14 DIAGNOSIS — E782 Mixed hyperlipidemia: Secondary | ICD-10-CM | POA: Diagnosis not present

## 2015-03-14 DIAGNOSIS — E119 Type 2 diabetes mellitus without complications: Secondary | ICD-10-CM | POA: Diagnosis not present

## 2015-03-14 DIAGNOSIS — I252 Old myocardial infarction: Secondary | ICD-10-CM | POA: Diagnosis not present

## 2015-03-14 DIAGNOSIS — I10 Essential (primary) hypertension: Secondary | ICD-10-CM | POA: Diagnosis not present

## 2015-03-14 DIAGNOSIS — J449 Chronic obstructive pulmonary disease, unspecified: Secondary | ICD-10-CM | POA: Diagnosis not present

## 2015-04-12 ENCOUNTER — Ambulatory Visit (INDEPENDENT_AMBULATORY_CARE_PROVIDER_SITE_OTHER): Payer: Medicare Other

## 2015-04-12 DIAGNOSIS — Z95810 Presence of automatic (implantable) cardiac defibrillator: Secondary | ICD-10-CM

## 2015-04-12 DIAGNOSIS — I5022 Chronic systolic (congestive) heart failure: Secondary | ICD-10-CM

## 2015-04-13 NOTE — Progress Notes (Signed)
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 Gained more than 2 pounds in a day or more than 5 pounds in a week? unsure  2. Had changes in your medications (with verification of current medications)? no  3. Had more shortness of breath than is usual for you? no  4. Limited your activity because of shortness of breath? no  5. Not been able to sleep because of shortness of breath? no  6. Had increased swelling in your feet or ankles? no  7. Had symptoms of dehydration (dizziness, dry mouth, increased thirst, decreased urine output) no  8. Had changes in sodium restriction? no  9. Been compliant with medication? Yes   ICM trend: 3 month view for 04/13/2015   ICM trend: 1 year view for 04/13/2015   Follow-up plan: ICM clinic phone appointment on 05/14/2015.  Thoracic impedance below reference line from 04/08/2015 to 04/10/2015 suggesting fluid accumulation.  Patient denied any fluid symptoms and reviewed symptoms to call about.  Education given to limit sodium intake to < 2000 mg and fluid intake to 64 oz daily.  Encouraged to call for any fluid symptoms.  No changes today.    Rosalene Billings, RN, CCM 04/13/2015 3:23 PM

## 2015-05-14 ENCOUNTER — Ambulatory Visit (INDEPENDENT_AMBULATORY_CARE_PROVIDER_SITE_OTHER): Payer: Medicare Other

## 2015-05-14 DIAGNOSIS — Z95810 Presence of automatic (implantable) cardiac defibrillator: Secondary | ICD-10-CM

## 2015-05-14 DIAGNOSIS — I5022 Chronic systolic (congestive) heart failure: Secondary | ICD-10-CM | POA: Diagnosis not present

## 2015-05-14 NOTE — Progress Notes (Signed)
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 Gained more than 2 pounds in a day or more than 5 pounds in a week? N/A  2. Had changes in your medications (with verification of current medications)? N/A  3. Had more shortness of breath than is usual for you? N/A  4. Limited your activity because of shortness of breath? N/A  5. Not been able to sleep because of shortness of breath? N/A  6. Had increased swelling in your feet or ankles? N/A  7. Had symptoms of dehydration (dizziness, dry mouth, increased thirst, decreased urine output) N/A  8. Had changes in sodium restriction? N/A  9. Been compliant with medication? N/A   ICM trend: 3 month view for 05/14/2015   ICM trend: 1 year view for 05/14/2015   Follow-up plan: ICM clinic phone appointment on  06/14/2015.  Attempted call to patient and unable to reach.  Transmission reviewed.  Thoracic impedance below reference line from 04/27/2015 to 04/28/2015 and 05/03/2015 to 4/8/2017suggesting fluid accumulation and returned to reference line 05/06/2015 suggesting fluid levels stabilized.      Rosalene Billings, RN, CCM 05/14/2015 1:19 PM

## 2015-05-15 NOTE — Progress Notes (Signed)
Received call back from patient and caregiver.  He reported no changes at this time.  Symptoms: Denied any fluid symptoms during the time thoracic impedance was below reference line.  . No current weight, difficult to stand.  No med changes.  Encouraged to call for any fluid symptoms.  No changes today. Next ICM remote transmission scheduled for 06/14/2015.

## 2015-05-17 DIAGNOSIS — H25811 Combined forms of age-related cataract, right eye: Secondary | ICD-10-CM | POA: Diagnosis not present

## 2015-05-17 DIAGNOSIS — H524 Presbyopia: Secondary | ICD-10-CM | POA: Diagnosis not present

## 2015-05-17 DIAGNOSIS — H353131 Nonexudative age-related macular degeneration, bilateral, early dry stage: Secondary | ICD-10-CM | POA: Diagnosis not present

## 2015-05-17 DIAGNOSIS — H26492 Other secondary cataract, left eye: Secondary | ICD-10-CM | POA: Diagnosis not present

## 2015-06-03 DIAGNOSIS — R319 Hematuria, unspecified: Secondary | ICD-10-CM | POA: Diagnosis not present

## 2015-06-04 DIAGNOSIS — R310 Gross hematuria: Secondary | ICD-10-CM | POA: Diagnosis not present

## 2015-06-04 DIAGNOSIS — R3912 Poor urinary stream: Secondary | ICD-10-CM | POA: Diagnosis not present

## 2015-06-04 DIAGNOSIS — N138 Other obstructive and reflux uropathy: Secondary | ICD-10-CM | POA: Diagnosis not present

## 2015-06-04 DIAGNOSIS — Z0000 Encounter for general adult medical examination without abnormal findings: Secondary | ICD-10-CM | POA: Diagnosis not present

## 2015-06-04 DIAGNOSIS — N401 Enlarged prostate with lower urinary tract symptoms: Secondary | ICD-10-CM | POA: Diagnosis not present

## 2015-06-06 ENCOUNTER — Ambulatory Visit (HOSPITAL_COMMUNITY)
Admission: RE | Admit: 2015-06-06 | Discharge: 2015-06-06 | Disposition: A | Discharge status: Home / Self Care | Payer: Medicare Other | Source: Ambulatory Visit | Attending: Physician Assistant | Admitting: Physician Assistant

## 2015-06-06 ENCOUNTER — Other Ambulatory Visit: Payer: Self-pay

## 2015-06-06 ENCOUNTER — Encounter: Payer: Self-pay | Admitting: Physician Assistant

## 2015-06-06 ENCOUNTER — Ambulatory Visit (INDEPENDENT_AMBULATORY_CARE_PROVIDER_SITE_OTHER): Payer: Medicare Other | Admitting: Physician Assistant

## 2015-06-06 ENCOUNTER — Ambulatory Visit (INDEPENDENT_AMBULATORY_CARE_PROVIDER_SITE_OTHER): Payer: Medicare Other

## 2015-06-06 ENCOUNTER — Ambulatory Visit (HOSPITAL_BASED_OUTPATIENT_CLINIC_OR_DEPARTMENT_OTHER): Payer: Medicare Other

## 2015-06-06 ENCOUNTER — Telehealth: Payer: Self-pay | Admitting: Internal Medicine

## 2015-06-06 VITALS — BP 100/50 | HR 72 | Ht 71.0 in | Wt 180.0 lb

## 2015-06-06 DIAGNOSIS — J449 Chronic obstructive pulmonary disease, unspecified: Secondary | ICD-10-CM | POA: Insufficient documentation

## 2015-06-06 DIAGNOSIS — R0602 Shortness of breath: Secondary | ICD-10-CM | POA: Diagnosis not present

## 2015-06-06 DIAGNOSIS — I2510 Atherosclerotic heart disease of native coronary artery without angina pectoris: Secondary | ICD-10-CM

## 2015-06-06 DIAGNOSIS — I252 Old myocardial infarction: Secondary | ICD-10-CM | POA: Diagnosis not present

## 2015-06-06 DIAGNOSIS — Z8674 Personal history of sudden cardiac arrest: Secondary | ICD-10-CM | POA: Insufficient documentation

## 2015-06-06 DIAGNOSIS — Z95810 Presence of automatic (implantable) cardiac defibrillator: Secondary | ICD-10-CM

## 2015-06-06 DIAGNOSIS — I080 Rheumatic disorders of both mitral and aortic valves: Secondary | ICD-10-CM | POA: Insufficient documentation

## 2015-06-06 DIAGNOSIS — E785 Hyperlipidemia, unspecified: Secondary | ICD-10-CM | POA: Insufficient documentation

## 2015-06-06 DIAGNOSIS — I255 Ischemic cardiomyopathy: Secondary | ICD-10-CM

## 2015-06-06 DIAGNOSIS — I5023 Acute on chronic systolic (congestive) heart failure: Secondary | ICD-10-CM | POA: Diagnosis not present

## 2015-06-06 DIAGNOSIS — E119 Type 2 diabetes mellitus without complications: Secondary | ICD-10-CM | POA: Diagnosis not present

## 2015-06-06 DIAGNOSIS — Z9981 Dependence on supplemental oxygen: Secondary | ICD-10-CM | POA: Diagnosis not present

## 2015-06-06 DIAGNOSIS — Z87891 Personal history of nicotine dependence: Secondary | ICD-10-CM | POA: Insufficient documentation

## 2015-06-06 DIAGNOSIS — I509 Heart failure, unspecified: Secondary | ICD-10-CM | POA: Insufficient documentation

## 2015-06-06 DIAGNOSIS — N189 Chronic kidney disease, unspecified: Secondary | ICD-10-CM

## 2015-06-06 DIAGNOSIS — I5022 Chronic systolic (congestive) heart failure: Secondary | ICD-10-CM

## 2015-06-06 LAB — CBC WITH DIFFERENTIAL/PLATELET
BASOS PCT: 0 %
Basophils Absolute: 0 cells/uL (ref 0–200)
Eosinophils Absolute: 134 cells/uL (ref 15–500)
Eosinophils Relative: 2 %
HEMATOCRIT: 28.2 % — AB (ref 38.5–50.0)
HEMOGLOBIN: 9.2 g/dL — AB (ref 13.2–17.1)
LYMPHS ABS: 1005 {cells}/uL (ref 850–3900)
LYMPHS PCT: 15 %
MCH: 21.6 pg — ABNORMAL LOW (ref 27.0–33.0)
MCHC: 32.6 g/dL (ref 32.0–36.0)
MCV: 66.4 fL — ABNORMAL LOW (ref 80.0–100.0)
MONO ABS: 268 {cells}/uL (ref 200–950)
Monocytes Relative: 4 %
Neutro Abs: 5293 cells/uL (ref 1500–7800)
Neutrophils Relative %: 79 %
Platelets: 96 10*3/uL — ABNORMAL LOW (ref 140–400)
RBC: 4.25 MIL/uL (ref 4.20–5.80)
RDW: 18.7 % — AB (ref 11.0–15.0)
WBC: 6.7 10*3/uL (ref 3.8–10.8)

## 2015-06-06 LAB — BASIC METABOLIC PANEL
BUN: 37 mg/dL — ABNORMAL HIGH (ref 7–25)
CALCIUM: 8.5 mg/dL — AB (ref 8.6–10.3)
CO2: 28 mmol/L (ref 20–31)
Chloride: 107 mmol/L (ref 98–110)
Creat: 1.48 mg/dL — ABNORMAL HIGH (ref 0.70–1.11)
Glucose, Bld: 93 mg/dL (ref 65–99)
Potassium: 4.7 mmol/L (ref 3.5–5.3)
Sodium: 144 mmol/L (ref 135–146)

## 2015-06-06 MED ORDER — FUROSEMIDE 20 MG PO TABS: 20.0000 mg | ORAL_TABLET | Freq: Every day | ORAL | Status: DC

## 2015-06-06 NOTE — Telephone Encounter (Signed)
New Message  Pt dtr calling to speak w/ RN- stated that due to fluid retention around his heart- he has been up since 4am this morning 5/10- pt dtr wants pt to be seen today. Please call back and discuss.

## 2015-06-06 NOTE — Addendum Note (Signed)
Addended by: Gaetano Net on: 06/06/2015 04:20 PM   Modules accepted: Orders

## 2015-06-06 NOTE — Patient Instructions (Addendum)
Medication Instructions:  Your physician has recommended you make the following change in your medication:  1.  START Lasix 20 mg taking 1 tablet daily X's 3 days then stop  Labwork: TODAY:  BMP, BNP, & CBC W/DIFF   Testing/Procedures: Your physician has requested that you have an echocardiogram. Echocardiography is a painless test that uses sound waves to create images of your heart. It provides your doctor with information about the size and shape of your heart and how well your heart's chambers and valves are working. This procedure takes approximately one hour. There are no restrictions for this procedure.  A chest x-ray takes a picture of the organs and structures inside the chest, including the heart, lungs, and blood vessels. This test can show several things, including, whether the heart is enlarges; whether fluid is building up in the lungs; and whether pacemaker / defibrillator leads are still in place.   Follow-Up: Your physician recommends that you schedule a follow-up appointment in:  June WITH DR. Kyla Balzarine 1ST AVAILABLE   Any Other Special Instructions Will Be Listed Below (If Applicable).  Echocardiogram An echocardiogram, or echocardiography, uses sound waves (ultrasound) to produce an image of your heart. The echocardiogram is simple, painless, obtained within a short period of time, and offers valuable information to your health care provider. The images from an echocardiogram can provide information such as:  Evidence of coronary artery disease (CAD).  Heart size.  Heart muscle function.  Heart valve function.  Aneurysm detection.  Evidence of a past heart attack.  Fluid buildup around the heart.  Heart muscle thickening.  Assess heart valve function. LET [facility] CARE PROVIDER KNOW ABOUT:  Any allergies you have.  All medicines you are taking, including vitamins, herbs, eye drops, creams, and over-the-counter medicines.  Previous problems you or  members of your family have had with the use of anesthetics.  Any blood disorders you have.  Previous surgeries you have had.  Medical conditions you have.  Possibility of pregnancy, if this applies. BEFORE THE PROCEDURE  No special preparation is needed. Eat and drink normally.  PROCEDURE   In order to produce an image of your heart, gel will be applied to your chest and a wand-like tool (transducer) will be moved over your chest. The gel will help transmit the sound waves from the transducer. The sound waves will harmlessly bounce off your heart to allow the heart images to be captured in real-time motion. These images will then be recorded.  You may need an IV to receive a medicine that improves the quality of the pictures. AFTER THE PROCEDURE You may return to your normal schedule including diet, activities, and medicines, unless your health care provider tells you otherwise.   This information is not intended to replace advice given to you by your health care provider. Make sure you discuss any questions you have with your health care provider.   Document Released: 01/11/2000 Document Revised: 02/03/2014 Document Reviewed: 09/20/2012 Elsevier Interactive Patient Education 2016 [place] are tests that create pictures of the inside of your body using radiation. Different body parts absorb different amounts of radiation, which show up on the X-ray pictures in shades of black, gray, and white.  X-rays are used to look for many health conditions, including broken bones, lung problems, and some types of cancer.  LET [facility] CARE PROVIDER KNOW ABOUT:  Any allergies you have.  All medicines you are taking, including vitamins, herbs, eye drops,  creams, and over-the-counter medicines.  Previous surgeries you have had.  Medical conditions you have. RISK AND COMPLICATIONS Getting an X-ray is a safe procedure.  BEFORE THE PROCEDURE  Tell the X-ray  technician if you are pregnant or might be pregnant.  You may be asked to wear a protective lead apron to hide parts of your body from the X-ray.  You usually will need to undress whatever part of your body needs the X-ray. You will be given a hospital gown to wear, if needed.  You may need to remove your glasses, jewelry, and other metal objects. PROCEDURE   The X-ray machine creates a picture by using a tiny burst of radiation. It is painless.  You may need to have several pictures taken at different angles.  You will need to try to be as still as you can during the examination to get the best possible images. AFTER THE PROCEDURE  You will be able to resume your normal activities.  The X-ray will be examined by your health care provider or a radiology specialist.  It is your responsibility to get your test results. Ask your health care provider when to expect your results and how to get them.   This information is not intended to replace advice given to you by your health care provider. Make sure you discuss any questions you have with your health care provider.   Document Released: 01/13/2005 Document Revised: 02/03/2014 Document Reviewed: 03/09/2013 Elsevier Interactive Patient Education [employer].  If you need a refill on your cardiac medications before your next appointment, please call your pharmacy.

## 2015-06-06 NOTE — Progress Notes (Signed)
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 Gained more than 2 pounds in a day or more than 5 pounds in a week? unknown  2. Had changes in your medications (with verification of current medications)? no  3. Had more shortness of breath than is usual for you? Yes  4. Limited your activity because of shortness of breath? no  5. Not been able to sleep because of shortness of breath? Yes  6. Had increased swelling in your feet, ankles, legs or stomach area? no  7. Had symptoms of dehydration (dizziness, dry mouth, increased thirst, decreased urine output) no  8. Had changes in sodium restriction? no  9. Been compliant with medication? Yes  ICM trend: 3 month view for 06/06/2015   ICM trend: 1 year view for 06/06/2015   Follow-up plan: ICM clinic phone appointment 06/14/2015.    Dr Jackalyn Lombard nurse, Janan Halter, RN received message that patient was having difficulty breathing and wanted to check remote transmission.  Transmission reviewed and call to patient's daughter [name].  Advised transmission suggest that he is retaining fluid and Claiborne Billings has suggested patient be seen in the office today due to his symptoms of SOB during the night.  Advised appointment made for 3:00pm today with Angelena Form, PA and she agreed.     FLUID LEVELS: Corvue thoracic impedance decreased 06/04/2015 to 06/06/2015 suggesting fluid accumulation.    SYMPTOMS:  Reported SOB especially when lying down last night.     Advised will send to Angelena Form, PA for review today since patient has appointment at 3:00PM.  Will also send to primary cardiologist and device following physician for review.    Rosalene Billings, RN, CCM 06/06/2015 12:03 PM

## 2015-06-06 NOTE — Telephone Encounter (Signed)
Had Sharman Cheek, RN call patient to resend ICM transmission from device to look at his fluid status.  As of 06/04/15 started retain fluid.  He wants to be seen today.  I have added him to PA schedule at 3pm.  He is aware

## 2015-06-06 NOTE — Progress Notes (Addendum)
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 Johnsie Cancel Dr. 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   In 2011, he survived a prolonged out of hospital cardiac arrest with ventricular fibrillation. He was found to have CAD with stents in RCA/circumflex and RI. EF 31% with large area of scar by MRI. St Jude AICD subsequently placed. He has not been hospitalized for his heart since then with no AICD d/c and no clinical CHF. Last 2D ECHO in 2015 showed EF 45-50%.  He called the office with worsening shortness of breath and cough. His device was interrogated which showed decreasing thoracic impedance since 06/04/15.   He was in his usual state of health until yesterday when he noticed a worsening cough with dark yellow sputum. No fevers or chills. He was also feeling more SOB. He even wore his night time oxygen yesterday. He did have some orthopnea and PND. He took some albuterol and started to feel better. No CP. No LE edema. No dizziness or syncope.   Daughter with him today She is concerned about pneumonia. He always has some mechanical issues with his upper airway and breathing  Denies dietary indiscretion  Patient was nice enough to bring a dozen [employer] to office today    Past Medical History  Diagnosis Date  . CAD (coronary artery disease)   . Ischemic cardiomyopathy   . CHF (congestive heart failure) ([place])   . Dyslipidemia   . Diverticulosis of colon (without mention of hemorrhage)   . Diabetes mellitus   . Anemia, unspecified   . Unspecified disorder resulting from impaired renal function    . Chronic airway obstruction, not elsewhere classified     on o2 at night  . Prostatic hypertrophy     hx of  . Retention of urine, unspecified   . ICD (implantable cardiac defibrillator) in place     st jude  . Emphysema   . Hemorrhoids   . MI, old 2010 or 2011  . Macular degeneration   . Cataracts, bilateral     Past Surgical History  Procedure Laterality Date  . Pci      4/11  . Cardiac defibrillator placement  2011    st jude  . Pacemaker insertion  2011  . Tonsillectomy  82 yo  . Cataract extraction      Current Medications: Outpatient Prescriptions Prior to Visit  Medication Sig Dispense Refill  . acetaminophen-codeine (TYLENOL #3) 300-30 MG per tablet Take 1 tablet by mouth daily as needed. Pain    . albuterol (PROVENTIL) (5 MG/ML) 0.5% nebulizer solution Take 2.5 mg by nebulization every 6 (six) hours as needed for wheezing or shortness of breath.    . ALPRAZolam (XANAX) 0.25 MG tablet Take 0.25 mg by mouth as needed. As needed for sleep    . arformoterol (BROVANA) 15 MCG/2ML NEBU Take 2 mLs (15 mcg total) by nebulization 2 (two) times daily. 120 mL 3  . aspirin EC 81 MG tablet Take 1 tablet (81 mg total) by mouth at bedtime. [place]  tablet 0  . bisacodyl (DULCOLAX) 5 MG EC tablet Take 5 mg by mouth daily as needed for mild constipation or moderate constipation.    . budesonide (PULMICORT) 0.5 MG/2ML nebulizer solution Take 2 mLs (0.5 mg total) by nebulization 2 (two) times daily. 120 mL 3  . famotidine (PEPCID) 20 MG tablet Take 20 mg by mouth at bedtime.     Marland Kitchen HYDROcodone-acetaminophen (VICODIN) 2.5-500 MG per tablet Take 1 tablet by mouth every 6 (six) hours as needed for pain. 30 tablet 0  . metoprolol tartrate (LOPRESSOR) 25 MG tablet Take 12.5 mg by mouth 2 (two) times daily. Reported on 06/06/2015    . clopidogrel (PLAVIX) 75 MG tablet TAKE (1) TABLET DAILY IN THE MORNING. (Patient not taking: Reported on 06/06/2015) 30 tablet 11  . losartan (COZAAR) 25 MG tablet TAKE  1 TABLET ONCE DAILY. (Patient not taking: Reported on 06/06/2015) 30 tablet 11  . nitroGLYCERIN (NITROSTAT) 0.4 MG SL tablet Place 1 tablet (0.4 mg total) under the tongue every 5 (five) minutes as needed for chest pain. (Patient taking differently: Place 0.4 mg under the tongue every 5 (five) minutes as needed for chest pain. UP TO 3 DOSES THEN CALL EMS) 25 tablet 4  . rosuvastatin (CRESTOR) 5 MG tablet TAKE (1) TABLET DAILY IN THE MORNING. (Patient not taking: Reported on 06/06/2015) 30 tablet 11   No facility-administered medications prior to visit.     Allergies:   Review of patient's allergies indicates no known allergies.   Social History   Social History  . Marital Status: Married    Spouse Name: N/A  . Number of Children: N/A  . Years of Education: N/A   Occupational History  . dentist    Social History Main Topics  . Smoking status: Former Smoker    Types: Cigarettes    Quit date: 04/27/2009  . Smokeless tobacco: Never Used  . Alcohol Use: Yes     Comment: occasional beer  . Drug Use: No  . Sexual Activity: No   Other Topics Concern  . None   Social History Narrative   Lives in a house with stairs, which he needs to use, with his wife.  Wife is not able-bodied.  Does not use a cane or walker, but is unsteady on his feet.        [name] Vila:  [phone] cell, [phone], daughter, POA.     Boyd [id number]  [phone], nephew   Juluis [id number]  [phone], daughter              Family History:  The patient's family history includes Atrial fibrillation in his daughter; Breast cancer in his sister; Congestive Heart Failure in his mother; Coronary artery disease in his mother; Heart attack in his brother; Lung cancer in his father; Stroke in his mother.   ROS:   Please see the history of present illness.    ROS All other systems reviewed and are negative.   PHYSICAL EXAM:   VS:  BP 100/50 mmHg  Pulse 72  Ht 5\' 11"  (1.803 m)  Wt 180 lb (81.647 kg)  BMI  25.12 kg/m2  SpO2 92%   Elderly retired Pharmacist, community  HEENT: normal Neck: no JVD, carotid bruits, or masses Cardiac: RRR; no murmurs, rubs, or gallops,no edema  AICD under left clavicle  Respiratory:  Diffuse rhonchi, no wheezing.  normal work of breathing GI: soft, nontender, nondistended, + BS MS: no deformity or atrophy Skin: warm and dry, no rash Neuro:  Alert  and Oriented x 3, Strength and sensation are intact Psych: euthymic mood, full affect  Wt Readings from Last 3 Encounters:  06/06/15 180 lb (81.647 kg)  03/12/15 178 lb 9.6 oz (81.012 kg)  08/28/14 180 lb 6.4 oz (81.829 kg)      Studies/Labs Reviewed:   EKG:  EKG is ordered today.  NSR with PAC HR 77  Recent Labs: 06/07/2014: ALT 16*; BUN 32*; Creatinine, Ser 1.22; Hemoglobin 9.9*; Platelets 111*; Potassium 4.7; Sodium 137   Lipid Panel No results found for: CHOL, TRIG, HDL, CHOLHDL, VLDL, LDLCALC, LDLDIRECT  Additional studies/ records that were reviewed today include:  LHC in 2011: PCI/DES to RI, dLCx and RCA.   2D ECHO: 11/15/2013 LV EF: 45% -  50% Study Conclusions - Left ventricle: Distal septal and apical hypokinesis. The cavity size was mildly dilated. Systolic function was mildly reduced. The estimated ejection fraction was in the range of 45% to 50%. - Aortic valve: Small systolic gradient without significant stenosis Valve area (VTI): 1.8 cm^2. Valve area (Vmax): 1.72 cm^2. - Mitral valve: Calcified annulus. Mildly thickened leaflets . - Left atrium: The atrium was mildly dilated. - Atrial septum: No defect or patent foramen ovale was identified. - Pulmonary arteries: PA peak pressure: 40 mm Hg (S).  ASSESSMENT:    1. SOB (shortness of breath)   2. Acute on chronic systolic CHF (congestive heart failure) ([place])   3. Coronary artery disease involving native coronary artery of native heart without angina pectoris   4. H/O cardiac arrest   5. HLD (hyperlipidemia)   6. Chronic obstructive  pulmonary disease, unspecified COPD type ([place])   7. CKD (chronic kidney disease), unspecified stage      PLAN:  In order of problems listed above:  SOB and cough: Device interrogation shows that thoracic impedance has been trending down since 06/04/15 c/w volume overload. Weight today 180 lbs. He was 178 in 02/2015 when he saw Dr. Rayann Heman and felt to be euvolemic. He actually looks pretty good in regard to minimal LE edema.  Lung exam sounds Worse Will get CXR to r/o pneumonia No need to cover with antibiotics now. BMET/BNP/CBC ordered Start lasix 20 mg  Chronic systolic CHF/ICM: EF was mildly reduced to 45-50% in 10/2013. Start lasix given falling impedence on AICD  CAD s/p DES to RI, dLCX and RCA (2011): continue lifelong ASA and plavix. Continue BB and statin.   H/O VF cardiac arrest: s/p St. Jude ICd followed by Dr. Rayann Heman.  HLD: continue statin.   COPD: stable. followed by Dr Melvyn Novas  CKD: check BMET today and follow closely.   Medication Adjustments/Labs and Tests Ordered: Current medicines are reviewed at length with the patient today.  Concerns regarding medicines are outlined above.  Medication changes, Labs and Tests ordered today are listed in the Patient Instructions below. Patient Instructions  Medication Instructions:  Your physician has recommended you make the following change in your medication:  1.  START Lasix 20 mg taking 1 tablet daily X's 3 days then stop  Labwork: TODAY:  BMP, BNP, & CBC W/DIFF   Testing/Procedures: Your physician has requested that you have an echocardiogram. Echocardiography is a painless test that uses sound waves to create images of your heart. It provides your doctor with information about the size and shape of your heart and how well your heart's chambers and valves are working. This procedure takes approximately one hour. There are no restrictions for this procedure.  A chest x-ray takes a picture of  the organs and structures inside the  chest, including the heart, lungs, and blood vessels. This test can show several things, including, whether the heart is enlarges; whether fluid is building up in the lungs; and whether pacemaker / defibrillator leads are still in place.   Follow-Up: Your physician recommends that you schedule a follow-up appointment in:  June WITH DR. Kyla Balzarine 1ST AVAILABLE   Any Other Special Instructions Will Be Listed Below (If Applicable).  Echocardiogram An echocardiogram, or echocardiography, uses sound waves (ultrasound) to produce an image of your heart. The echocardiogram is simple, painless, obtained within a short period of time, and offers valuable information to your health care provider. The images from an echocardiogram can provide information such as:  Evidence of coronary artery disease (CAD).  Heart size.  Heart muscle function.  Heart valve function.  Aneurysm detection.  Evidence of a past heart attack.  Fluid buildup around the heart.  Heart muscle thickening.  Assess heart valve function. LET [facility] CARE PROVIDER KNOW ABOUT:  Any allergies you have.  All medicines you are taking, including vitamins, herbs, eye drops, creams, and over-the-counter medicines.  Previous problems you or members of your family have had with the use of anesthetics.  Any blood disorders you have.  Previous surgeries you have had.  Medical conditions you have.  Possibility of pregnancy, if this applies. BEFORE THE PROCEDURE  No special preparation is needed. Eat and drink normally.  PROCEDURE   In order to produce an image of your heart, gel will be applied to your chest and a wand-like tool (transducer) will be moved over your chest. The gel will help transmit the sound waves from the transducer. The sound waves will harmlessly bounce off your heart to allow the heart images to be captured in real-time motion. These images will then be recorded.  You may need an IV to receive a  medicine that improves the quality of the pictures. AFTER THE PROCEDURE You may return to your normal schedule including diet, activities, and medicines, unless your health care provider tells you otherwise.   This information is not intended to replace advice given to you by your health care provider. Make sure you discuss any questions you have with your health care provider.   Document Released: 01/11/2000 Document Revised: 02/03/2014 Document Reviewed: 09/20/2012 Elsevier Interactive Patient Education 2016 [place] are tests that create pictures of the inside of your body using radiation. Different body parts absorb different amounts of radiation, which show up on the X-ray pictures in shades of black, gray, and white.  X-rays are used to look for many health conditions, including broken bones, lung problems, and some types of cancer.  LET [facility] CARE PROVIDER KNOW ABOUT:  Any allergies you have.  All medicines you are taking, including vitamins, herbs, eye drops, creams, and over-the-counter medicines.  Previous surgeries you have had.  Medical conditions you have. RISK AND COMPLICATIONS Getting an X-ray is a safe procedure.  BEFORE THE PROCEDURE  Tell the X-ray technician if you are pregnant or might be pregnant.  You may be asked to wear a protective lead apron to hide parts of your body from the X-ray.  You usually will need to undress whatever part of your body needs the X-ray. You will be given a hospital gown to wear, if needed.  You may need to remove your glasses, jewelry, and other metal objects. PROCEDURE   The X-ray machine creates a picture by  using a tiny burst of radiation. It is painless.  You may need to have several pictures taken at different angles.  You will need to try to be as still as you can during the examination to get the best possible images. AFTER THE PROCEDURE  You will be able to resume your normal  activities.  The X-ray will be examined by your health care provider or a radiology specialist.  It is your responsibility to get your test results. Ask your health care provider when to expect your results and how to get them.   This information is not intended to replace advice given to you by your health care provider. Make sure you discuss any questions you have with your health care provider.   Document Released: 01/13/2005 Document Revised: 02/03/2014 Document Reviewed: 03/09/2013 Elsevier Interactive Patient Education [employer].  If you need a refill on your cardiac medications before your next appointment, please call your pharmacy.      Jenkins Rouge

## 2015-06-07 ENCOUNTER — Telehealth: Payer: Self-pay | Admitting: Cardiovascular Disease

## 2015-06-07 LAB — ECHOCARDIOGRAM COMPLETE
HEIGHTINCHES: 71 in
WEIGHTICAEL: 2880 [oz_av]

## 2015-06-07 LAB — BRAIN NATRIURETIC PEPTIDE: Brain Natriuretic Peptide: 45.9 pg/mL (ref <100)

## 2015-06-07 NOTE — Telephone Encounter (Signed)
New Message:  Pt's daughter is calling in to get the results to the pt's labs and the chest CT that was done at [facility]. She states that the pt's breathing has not improved and is concerned. Please f/u with her

## 2015-06-07 NOTE — Telephone Encounter (Signed)
Pam Renaldo Reel, RN gave pt's daughter, [name], CXR results.

## 2015-06-07 NOTE — Telephone Encounter (Signed)
Patient's daughter Wadsworth [facility]) called wanting results of labs and chest x-ray. Chest x-ray results were given. Lab work is still pending, will call when resulted. Patient's daughter verbalized understanding.

## 2015-06-08 DIAGNOSIS — J449 Chronic obstructive pulmonary disease, unspecified: Secondary | ICD-10-CM | POA: Diagnosis not present

## 2015-06-08 DIAGNOSIS — J209 Acute bronchitis, unspecified: Secondary | ICD-10-CM | POA: Diagnosis not present

## 2015-06-11 ENCOUNTER — Inpatient Hospital Stay (HOSPITAL_COMMUNITY)
Admission: EM | Admit: 2015-06-11 | Discharge: 2015-06-20 | DRG: 871 | Disposition: A | Discharge status: Home Health | Payer: Medicare Other | Attending: Internal Medicine | Admitting: Internal Medicine

## 2015-06-11 ENCOUNTER — Other Ambulatory Visit: Payer: Medicare Other

## 2015-06-11 ENCOUNTER — Encounter (HOSPITAL_COMMUNITY): Payer: Self-pay | Admitting: Emergency Medicine

## 2015-06-11 ENCOUNTER — Emergency Department (HOSPITAL_COMMUNITY): Payer: Medicare Other

## 2015-06-11 ENCOUNTER — Inpatient Hospital Stay (HOSPITAL_COMMUNITY): Payer: Medicare Other

## 2015-06-11 DIAGNOSIS — D638 Anemia in other chronic diseases classified elsewhere: Secondary | ICD-10-CM | POA: Diagnosis present

## 2015-06-11 DIAGNOSIS — E1122 Type 2 diabetes mellitus with diabetic chronic kidney disease: Secondary | ICD-10-CM | POA: Diagnosis present

## 2015-06-11 DIAGNOSIS — N183 Chronic kidney disease, stage 3 unspecified: Secondary | ICD-10-CM | POA: Diagnosis present

## 2015-06-11 DIAGNOSIS — R6521 Severe sepsis with septic shock: Secondary | ICD-10-CM | POA: Diagnosis present

## 2015-06-11 DIAGNOSIS — J441 Chronic obstructive pulmonary disease with (acute) exacerbation: Secondary | ICD-10-CM | POA: Diagnosis not present

## 2015-06-11 DIAGNOSIS — J9600 Acute respiratory failure, unspecified whether with hypoxia or hypercapnia: Secondary | ICD-10-CM | POA: Diagnosis present

## 2015-06-11 DIAGNOSIS — J440 Chronic obstructive pulmonary disease with acute lower respiratory infection: Secondary | ICD-10-CM | POA: Diagnosis present

## 2015-06-11 DIAGNOSIS — D561 Beta thalassemia: Secondary | ICD-10-CM | POA: Diagnosis present

## 2015-06-11 DIAGNOSIS — Z9861 Coronary angioplasty status: Secondary | ICD-10-CM

## 2015-06-11 DIAGNOSIS — E872 Acidosis: Secondary | ICD-10-CM | POA: Diagnosis present

## 2015-06-11 DIAGNOSIS — Z9981 Dependence on supplemental oxygen: Secondary | ICD-10-CM

## 2015-06-11 DIAGNOSIS — J189 Pneumonia, unspecified organism: Secondary | ICD-10-CM | POA: Diagnosis present

## 2015-06-11 DIAGNOSIS — K5900 Constipation, unspecified: Secondary | ICD-10-CM | POA: Diagnosis present

## 2015-06-11 DIAGNOSIS — R1319 Other dysphagia: Secondary | ICD-10-CM | POA: Diagnosis present

## 2015-06-11 DIAGNOSIS — I252 Old myocardial infarction: Secondary | ICD-10-CM | POA: Diagnosis not present

## 2015-06-11 DIAGNOSIS — R7881 Bacteremia: Secondary | ICD-10-CM | POA: Diagnosis present

## 2015-06-11 DIAGNOSIS — Z95810 Presence of automatic (implantable) cardiac defibrillator: Secondary | ICD-10-CM | POA: Diagnosis not present

## 2015-06-11 DIAGNOSIS — A419 Sepsis, unspecified organism: Secondary | ICD-10-CM | POA: Diagnosis present

## 2015-06-11 DIAGNOSIS — Z79899 Other long term (current) drug therapy: Secondary | ICD-10-CM

## 2015-06-11 DIAGNOSIS — I255 Ischemic cardiomyopathy: Secondary | ICD-10-CM | POA: Diagnosis present

## 2015-06-11 DIAGNOSIS — Z87891 Personal history of nicotine dependence: Secondary | ICD-10-CM | POA: Diagnosis not present

## 2015-06-11 DIAGNOSIS — R0682 Tachypnea, not elsewhere classified: Secondary | ICD-10-CM | POA: Diagnosis not present

## 2015-06-11 DIAGNOSIS — I2510 Atherosclerotic heart disease of native coronary artery without angina pectoris: Secondary | ICD-10-CM | POA: Diagnosis present

## 2015-06-11 DIAGNOSIS — A4151 Sepsis due to Escherichia coli [E. coli]: Principal | ICD-10-CM | POA: Diagnosis present

## 2015-06-11 DIAGNOSIS — E785 Hyperlipidemia, unspecified: Secondary | ICD-10-CM | POA: Diagnosis present

## 2015-06-11 DIAGNOSIS — N12 Tubulo-interstitial nephritis, not specified as acute or chronic: Secondary | ICD-10-CM

## 2015-06-11 DIAGNOSIS — R509 Fever, unspecified: Secondary | ICD-10-CM | POA: Diagnosis present

## 2015-06-11 DIAGNOSIS — H3530 Unspecified macular degeneration: Secondary | ICD-10-CM | POA: Diagnosis present

## 2015-06-11 DIAGNOSIS — K219 Gastro-esophageal reflux disease without esophagitis: Secondary | ICD-10-CM | POA: Diagnosis present

## 2015-06-11 DIAGNOSIS — R6510 Systemic inflammatory response syndrome (SIRS) of non-infectious origin without acute organ dysfunction: Secondary | ICD-10-CM

## 2015-06-11 DIAGNOSIS — J9621 Acute and chronic respiratory failure with hypoxia: Secondary | ICD-10-CM | POA: Diagnosis present

## 2015-06-11 DIAGNOSIS — D509 Iron deficiency anemia, unspecified: Secondary | ICD-10-CM | POA: Diagnosis present

## 2015-06-11 DIAGNOSIS — I5022 Chronic systolic (congestive) heart failure: Secondary | ICD-10-CM | POA: Diagnosis present

## 2015-06-11 DIAGNOSIS — N10 Acute tubulo-interstitial nephritis: Secondary | ICD-10-CM | POA: Diagnosis present

## 2015-06-11 DIAGNOSIS — D649 Anemia, unspecified: Secondary | ICD-10-CM | POA: Diagnosis present

## 2015-06-11 DIAGNOSIS — E43 Unspecified severe protein-calorie malnutrition: Secondary | ICD-10-CM | POA: Diagnosis present

## 2015-06-11 DIAGNOSIS — Z7902 Long term (current) use of antithrombotics/antiplatelets: Secondary | ICD-10-CM | POA: Diagnosis not present

## 2015-06-11 DIAGNOSIS — J9601 Acute respiratory failure with hypoxia: Secondary | ICD-10-CM | POA: Diagnosis not present

## 2015-06-11 DIAGNOSIS — Z6823 Body mass index (BMI) 23.0-23.9, adult: Secondary | ICD-10-CM

## 2015-06-11 DIAGNOSIS — N400 Enlarged prostate without lower urinary tract symptoms: Secondary | ICD-10-CM | POA: Diagnosis present

## 2015-06-11 DIAGNOSIS — Z9849 Cataract extraction status, unspecified eye: Secondary | ICD-10-CM

## 2015-06-11 DIAGNOSIS — K228 Other specified diseases of esophagus: Secondary | ICD-10-CM | POA: Diagnosis present

## 2015-06-11 DIAGNOSIS — J9620 Acute and chronic respiratory failure, unspecified whether with hypoxia or hypercapnia: Secondary | ICD-10-CM | POA: Diagnosis present

## 2015-06-11 DIAGNOSIS — K2289 Other specified disease of esophagus: Secondary | ICD-10-CM | POA: Diagnosis present

## 2015-06-11 DIAGNOSIS — R0602 Shortness of breath: Secondary | ICD-10-CM | POA: Diagnosis not present

## 2015-06-11 DIAGNOSIS — Z7982 Long term (current) use of aspirin: Secondary | ICD-10-CM | POA: Diagnosis not present

## 2015-06-11 DIAGNOSIS — R05 Cough: Secondary | ICD-10-CM | POA: Diagnosis not present

## 2015-06-11 DIAGNOSIS — E875 Hyperkalemia: Secondary | ICD-10-CM | POA: Diagnosis not present

## 2015-06-11 DIAGNOSIS — B9620 Unspecified Escherichia coli [E. coli] as the cause of diseases classified elsewhere: Secondary | ICD-10-CM | POA: Diagnosis present

## 2015-06-11 DIAGNOSIS — J209 Acute bronchitis, unspecified: Secondary | ICD-10-CM | POA: Diagnosis present

## 2015-06-11 DIAGNOSIS — R1310 Dysphagia, unspecified: Secondary | ICD-10-CM | POA: Diagnosis present

## 2015-06-11 DIAGNOSIS — J439 Emphysema, unspecified: Secondary | ICD-10-CM | POA: Diagnosis not present

## 2015-06-11 LAB — BLOOD CULTURE ID PANEL (REFLEXED)
ACINETOBACTER BAUMANNII: NOT DETECTED
CANDIDA GLABRATA: NOT DETECTED
CANDIDA KRUSEI: NOT DETECTED
CANDIDA TROPICALIS: NOT DETECTED
CARBAPENEM RESISTANCE: NOT DETECTED
Candida albicans: NOT DETECTED
Candida parapsilosis: NOT DETECTED
ESCHERICHIA COLI: DETECTED — AB
Enterobacter cloacae complex: NOT DETECTED
Enterobacteriaceae species: NOT DETECTED
Enterococcus species: NOT DETECTED
HAEMOPHILUS INFLUENZAE: NOT DETECTED
KLEBSIELLA OXYTOCA: NOT DETECTED
Klebsiella pneumoniae: NOT DETECTED
LISTERIA MONOCYTOGENES: NOT DETECTED
Methicillin resistance: NOT DETECTED
Neisseria meningitidis: NOT DETECTED
PROTEUS SPECIES: NOT DETECTED
Pseudomonas aeruginosa: NOT DETECTED
SERRATIA MARCESCENS: NOT DETECTED
STAPHYLOCOCCUS SPECIES: NOT DETECTED
STREPTOCOCCUS PYOGENES: NOT DETECTED
Staphylococcus aureus (BCID): NOT DETECTED
Streptococcus agalactiae: NOT DETECTED
Streptococcus pneumoniae: NOT DETECTED
Streptococcus species: NOT DETECTED
Vancomycin resistance: NOT DETECTED

## 2015-06-11 LAB — CBC WITH DIFFERENTIAL/PLATELET
BASOS PCT: 0 %
Basophils Absolute: 0 10*3/uL (ref 0.0–0.1)
EOS ABS: 0 10*3/uL (ref 0.0–0.7)
Eosinophils Relative: 0 %
HCT: 29.2 % — ABNORMAL LOW (ref 39.0–52.0)
Hemoglobin: 9.1 g/dL — ABNORMAL LOW (ref 13.0–17.0)
LYMPHS ABS: 0.3 10*3/uL — AB (ref 0.7–4.0)
Lymphocytes Relative: 7 %
MCH: 21.2 pg — AB (ref 26.0–34.0)
MCHC: 31.2 g/dL (ref 30.0–36.0)
MCV: 67.9 fL — AB (ref 78.0–100.0)
MONO ABS: 0 10*3/uL — AB (ref 0.1–1.0)
Monocytes Relative: 1 %
NEUTROS PCT: 92 %
Neutro Abs: 4.3 10*3/uL (ref 1.7–7.7)
PLATELETS: 98 10*3/uL — AB (ref 150–400)
RBC: 4.3 MIL/uL (ref 4.22–5.81)
RDW: 18.2 % — AB (ref 11.5–15.5)
WBC: 4.6 10*3/uL (ref 4.0–10.5)

## 2015-06-11 LAB — I-STAT ARTERIAL BLOOD GAS, ED
Acid-base deficit: 2 mmol/L (ref 0.0–2.0)
Bicarbonate: 22.3 mEq/L (ref 20.0–24.0)
O2 SAT: 97 %
PCO2 ART: 36.5 mmHg (ref 35.0–45.0)
Patient temperature: 102.1
TCO2: 23 mmol/L (ref 0–100)
pH, Arterial: 7.403 (ref 7.350–7.450)
pO2, Arterial: 100 mmHg (ref 80.0–100.0)

## 2015-06-11 LAB — I-STAT CG4 LACTIC ACID, ED
Lactic Acid, Venous: 2.95 mmol/L (ref 0.5–2.0)
Lactic Acid, Venous: 2.44 mmol/L (ref 0.5–2.0)

## 2015-06-11 LAB — STREP PNEUMONIAE URINARY ANTIGEN: Strep Pneumo Urinary Antigen: NEGATIVE

## 2015-06-11 LAB — COMPREHENSIVE METABOLIC PANEL
ALK PHOS: 59 U/L (ref 38–126)
ALT: 33 U/L (ref 17–63)
AST: 39 U/L (ref 15–41)
Albumin: 3.5 g/dL (ref 3.5–5.0)
Anion gap: 11 (ref 5–15)
BILIRUBIN TOTAL: 0.7 mg/dL (ref 0.3–1.2)
BUN: 53 mg/dL — ABNORMAL HIGH (ref 6–20)
CALCIUM: 8.4 mg/dL — AB (ref 8.9–10.3)
CO2: 22 mmol/L (ref 22–32)
CREATININE: 1.82 mg/dL — AB (ref 0.61–1.24)
Chloride: 104 mmol/L (ref 101–111)
GFR, EST AFRICAN AMERICAN: 37 mL/min — AB (ref >60)
GFR, EST NON AFRICAN AMERICAN: 32 mL/min — AB (ref >60)
Glucose, Bld: 116 mg/dL — ABNORMAL HIGH (ref 65–99)
Potassium: 4.9 mmol/L (ref 3.5–5.1)
Sodium: 137 mmol/L (ref 135–145)
Total Protein: 6.9 g/dL (ref 6.5–8.1)

## 2015-06-11 LAB — URINALYSIS, ROUTINE W REFLEX MICROSCOPIC
BILIRUBIN URINE: NEGATIVE
Glucose, UA: NEGATIVE mg/dL
Ketones, ur: NEGATIVE mg/dL
Nitrite: POSITIVE — AB
Protein, ur: NEGATIVE mg/dL
SPECIFIC GRAVITY, URINE: 1.023 (ref 1.005–1.030)
pH: 5 (ref 5.0–8.0)

## 2015-06-11 LAB — CBC
HEMATOCRIT: 25 % — AB (ref 39.0–52.0)
Hemoglobin: 7.7 g/dL — ABNORMAL LOW (ref 13.0–17.0)
MCH: 21 pg — AB (ref 26.0–34.0)
MCHC: 30.8 g/dL (ref 30.0–36.0)
MCV: 68.1 fL — AB (ref 78.0–100.0)
Platelets: 76 10*3/uL — ABNORMAL LOW (ref 150–400)
RBC: 3.67 MIL/uL — ABNORMAL LOW (ref 4.22–5.81)
RDW: 18.3 % — AB (ref 11.5–15.5)
WBC: 16.4 10*3/uL — AB (ref 4.0–10.5)

## 2015-06-11 LAB — MRSA PCR SCREENING: MRSA by PCR: NEGATIVE

## 2015-06-11 LAB — CREATININE, SERUM
Creatinine, Ser: 1.9 mg/dL — ABNORMAL HIGH (ref 0.61–1.24)
GFR calc non Af Amer: 30 mL/min — ABNORMAL LOW (ref >60)
GFR, EST AFRICAN AMERICAN: 35 mL/min — AB (ref >60)

## 2015-06-11 LAB — INFLUENZA PANEL BY PCR (TYPE A & B)
H1N1FLUPCR: NOT DETECTED
INFLAPCR: NEGATIVE
INFLBPCR: NEGATIVE

## 2015-06-11 LAB — URINE MICROSCOPIC-ADD ON

## 2015-06-11 LAB — I-STAT TROPONIN, ED: Troponin i, poc: 0.03 ng/mL (ref 0.00–0.08)

## 2015-06-11 LAB — LACTIC ACID, PLASMA
Lactic Acid, Venous: 4.4 mmol/L (ref 0.5–2.0)
Lactic Acid, Venous: 2.7 mmol/L (ref 0.5–2.0)

## 2015-06-11 LAB — BRAIN NATRIURETIC PEPTIDE: B Natriuretic Peptide: 114.2 pg/mL — ABNORMAL HIGH (ref 0.0–100.0)

## 2015-06-11 LAB — PROCALCITONIN: PROCALCITONIN: 0.37 ng/mL

## 2015-06-11 MED ORDER — SODIUM CHLORIDE 0.9 % IV SOLN: INTRAVENOUS | Status: DC

## 2015-06-11 MED ORDER — ACETAMINOPHEN 650 MG RE SUPP
650.0000 mg | Freq: Once | RECTAL | Status: AC
  Administered 2015-06-11: 650 mg via RECTAL
  Filled 2015-06-11: qty 1

## 2015-06-11 MED ORDER — METHYLPREDNISOLONE SODIUM SUCC 125 MG IJ SOLR
60.0000 mg | Freq: Two times a day (BID) | INTRAMUSCULAR | Status: DC
  Administered 2015-06-11 – 2015-06-14 (×6): 60 mg via INTRAVENOUS
  Filled 2015-06-11 (×6): qty 2

## 2015-06-11 MED ORDER — ALBUTEROL SULFATE (2.5 MG/3ML) 0.083% IN NEBU
INHALATION_SOLUTION | RESPIRATORY_TRACT | Status: AC
  Filled 2015-06-11: qty 3

## 2015-06-11 MED ORDER — VANCOMYCIN HCL IN DEXTROSE 1-5 GM/200ML-% IV SOLN
1000.0000 mg | Freq: Once | INTRAVENOUS | Status: AC
  Administered 2015-06-11: 1000 mg via INTRAVENOUS
  Filled 2015-06-11: qty 200

## 2015-06-11 MED ORDER — ONDANSETRON HCL 4 MG/2ML IJ SOLN: 4.0000 mg | Freq: Four times a day (QID) | INTRAMUSCULAR | Status: DC | PRN

## 2015-06-11 MED ORDER — ALBUTEROL SULFATE (2.5 MG/3ML) 0.083% IN NEBU
2.5000 mg | INHALATION_SOLUTION | Freq: Four times a day (QID) | RESPIRATORY_TRACT | Status: DC
  Administered 2015-06-11 (×2): 2.5 mg via RESPIRATORY_TRACT
  Filled 2015-06-11 (×2): qty 3

## 2015-06-11 MED ORDER — CLOPIDOGREL BISULFATE 75 MG PO TABS
75.0000 mg | ORAL_TABLET | Freq: Every day | ORAL | Status: DC
  Administered 2015-06-11 – 2015-06-20 (×10): 75 mg via ORAL
  Filled 2015-06-11 (×10): qty 1

## 2015-06-11 MED ORDER — IPRATROPIUM BROMIDE 0.02 % IN SOLN
RESPIRATORY_TRACT | Status: AC
  Administered 2015-06-11: 0.5 mg
  Filled 2015-06-11: qty 2.5

## 2015-06-11 MED ORDER — ALBUTEROL SULFATE (2.5 MG/3ML) 0.083% IN NEBU
2.5000 mg | INHALATION_SOLUTION | RESPIRATORY_TRACT | Status: DC
  Administered 2015-06-11 – 2015-06-12 (×4): 2.5 mg via RESPIRATORY_TRACT
  Filled 2015-06-11 (×4): qty 3

## 2015-06-11 MED ORDER — ACETAMINOPHEN 325 MG PO TABS: 650.0000 mg | ORAL_TABLET | Freq: Four times a day (QID) | ORAL | Status: DC | PRN

## 2015-06-11 MED ORDER — SODIUM CHLORIDE 0.9 % IV BOLUS (SEPSIS)
500.0000 mL | Freq: Once | INTRAVENOUS | Status: AC
  Administered 2015-06-11: 500 mL via INTRAVENOUS

## 2015-06-11 MED ORDER — ASPIRIN EC 81 MG PO TBEC
81.0000 mg | DELAYED_RELEASE_TABLET | Freq: Every day | ORAL | Status: DC
  Administered 2015-06-11 – 2015-06-19 (×9): 81 mg via ORAL
  Filled 2015-06-11 (×9): qty 1

## 2015-06-11 MED ORDER — SODIUM CHLORIDE 0.9% FLUSH
3.0000 mL | Freq: Two times a day (BID) | INTRAVENOUS | Status: DC
  Administered 2015-06-11 – 2015-06-19 (×15): 3 mL via INTRAVENOUS

## 2015-06-11 MED ORDER — BISACODYL 10 MG RE SUPP: 10.0000 mg | Freq: Every day | RECTAL | Status: DC | PRN

## 2015-06-11 MED ORDER — AZITHROMYCIN 500 MG IV SOLR
500.0000 mg | INTRAVENOUS | Status: DC
  Administered 2015-06-11: 500 mg via INTRAVENOUS
  Filled 2015-06-11 (×2): qty 500

## 2015-06-11 MED ORDER — SODIUM CHLORIDE 0.9 % IV BOLUS (SEPSIS)
1000.0000 mL | Freq: Once | INTRAVENOUS | Status: AC
  Administered 2015-06-11: 1000 mL via INTRAVENOUS

## 2015-06-11 MED ORDER — BUDESONIDE 0.5 MG/2ML IN SUSP
0.5000 mg | Freq: Two times a day (BID) | RESPIRATORY_TRACT | Status: DC
  Administered 2015-06-11 – 2015-06-20 (×19): 0.5 mg via RESPIRATORY_TRACT
  Filled 2015-06-11 (×19): qty 2

## 2015-06-11 MED ORDER — PIPERACILLIN-TAZOBACTAM 3.375 G IVPB 30 MIN
3.3750 g | Freq: Once | INTRAVENOUS | Status: AC
  Administered 2015-06-11: 3.375 g via INTRAVENOUS
  Filled 2015-06-11: qty 50

## 2015-06-11 MED ORDER — ALBUTEROL SULFATE (2.5 MG/3ML) 0.083% IN NEBU: 2.5000 mg | INHALATION_SOLUTION | RESPIRATORY_TRACT | Status: DC | PRN

## 2015-06-11 MED ORDER — ALBUTEROL SULFATE (2.5 MG/3ML) 0.083% IN NEBU
2.5000 mg | INHALATION_SOLUTION | RESPIRATORY_TRACT | Status: DC | PRN
  Administered 2015-06-11 – 2015-06-16 (×8): 2.5 mg via RESPIRATORY_TRACT
  Filled 2015-06-11 (×10): qty 3

## 2015-06-11 MED ORDER — ALBUTEROL (5 MG/ML) CONTINUOUS INHALATION SOLN
INHALATION_SOLUTION | RESPIRATORY_TRACT | Status: AC
  Administered 2015-06-11: 15 mg/h
  Filled 2015-06-11: qty 20

## 2015-06-11 MED ORDER — ENOXAPARIN SODIUM 40 MG/0.4ML ~~LOC~~ SOLN
40.0000 mg | Freq: Every day | SUBCUTANEOUS | Status: DC
  Administered 2015-06-11: 40 mg via SUBCUTANEOUS
  Filled 2015-06-11: qty 0.4

## 2015-06-11 MED ORDER — HYDROCORTISONE NA SUCCINATE PF 100 MG IJ SOLR
100.0000 mg | Freq: Once | INTRAMUSCULAR | Status: AC
  Administered 2015-06-11: 100 mg via INTRAVENOUS
  Filled 2015-06-11: qty 2

## 2015-06-11 MED ORDER — DEXTROSE 5 % IV SOLN
2.0000 g | INTRAVENOUS | Status: AC
  Administered 2015-06-12 – 2015-06-17 (×6): 2 g via INTRAVENOUS
  Filled 2015-06-11 (×6): qty 2

## 2015-06-11 MED ORDER — BISACODYL 5 MG PO TBEC: 5.0000 mg | DELAYED_RELEASE_TABLET | Freq: Every day | ORAL | Status: DC | PRN

## 2015-06-11 MED ORDER — METHYLPREDNISOLONE SODIUM SUCC 125 MG IJ SOLR
60.0000 mg | Freq: Every day | INTRAMUSCULAR | Status: DC
Start: 2015-06-11 — End: 2015-06-11
  Administered 2015-06-11: 60 mg via INTRAVENOUS
  Filled 2015-06-11: qty 2

## 2015-06-11 MED ORDER — HYDROCORTISONE SOD SUCCINATE 100 MG PF FOR IT USE: 100.0000 mg | Freq: Once | INTRAMUSCULAR | Status: DC

## 2015-06-11 MED ORDER — ALBUTEROL SULFATE (2.5 MG/3ML) 0.083% IN NEBU
2.5000 mg | INHALATION_SOLUTION | RESPIRATORY_TRACT | Status: DC | PRN
  Administered 2015-06-11: 2.5 mg via RESPIRATORY_TRACT
  Filled 2015-06-11: qty 3

## 2015-06-11 MED ORDER — ROSUVASTATIN CALCIUM 10 MG PO TABS
5.0000 mg | ORAL_TABLET | Freq: Every day | ORAL | Status: DC
  Administered 2015-06-11 – 2015-06-20 (×10): 5 mg via ORAL
  Filled 2015-06-11 (×10): qty 1

## 2015-06-11 MED ORDER — SODIUM CHLORIDE 0.9 % IV SOLN
INTRAVENOUS | Status: DC
  Administered 2015-06-11: 09:00:00 via INTRAVENOUS

## 2015-06-11 MED ORDER — ALPRAZOLAM 0.25 MG PO TABS
0.2500 mg | ORAL_TABLET | Freq: Every evening | ORAL | Status: DC | PRN
  Administered 2015-06-11: 0.25 mg via ORAL
  Filled 2015-06-11: qty 1

## 2015-06-11 MED ORDER — ACETAMINOPHEN 500 MG PO TABS
1000.0000 mg | ORAL_TABLET | Freq: Once | ORAL | Status: DC
  Filled 2015-06-11: qty 2

## 2015-06-11 MED ORDER — IPRATROPIUM-ALBUTEROL 0.5-2.5 (3) MG/3ML IN SOLN
3.0000 mL | RESPIRATORY_TRACT | Status: DC
  Administered 2015-06-11: 3 mL via RESPIRATORY_TRACT
  Filled 2015-06-11: qty 3

## 2015-06-11 MED ORDER — ONDANSETRON HCL 4 MG PO TABS: 4.0000 mg | ORAL_TABLET | Freq: Four times a day (QID) | ORAL | Status: DC | PRN

## 2015-06-11 MED ORDER — GUAIFENESIN ER 600 MG PO TB12
600.0000 mg | ORAL_TABLET | Freq: Two times a day (BID) | ORAL | Status: DC
  Administered 2015-06-11 – 2015-06-19 (×17): 600 mg via ORAL
  Filled 2015-06-11 (×17): qty 1

## 2015-06-11 MED ORDER — ACETAMINOPHEN 650 MG RE SUPP: 650.0000 mg | Freq: Once | RECTAL | Status: DC

## 2015-06-11 MED ORDER — FAMOTIDINE 20 MG PO TABS
20.0000 mg | ORAL_TABLET | Freq: Every day | ORAL | Status: DC
  Administered 2015-06-11 – 2015-06-12 (×2): 20 mg via ORAL
  Filled 2015-06-11 (×2): qty 1

## 2015-06-11 MED ORDER — MORPHINE SULFATE (PF) 2 MG/ML IV SOLN
2.0000 mg | INTRAVENOUS | Status: DC | PRN
  Administered 2015-06-11: 2 mg via INTRAVENOUS
  Filled 2015-06-11: qty 1

## 2015-06-11 MED ORDER — DEXTROSE 5 % IV SOLN
1.0000 g | INTRAVENOUS | Status: DC
  Administered 2015-06-11: 1 g via INTRAVENOUS
  Filled 2015-06-11: qty 10

## 2015-06-11 MED ORDER — HYDROCORTISONE NA SUCCINATE PF 100 MG IJ SOLR: 100.0000 mg | Freq: Once | INTRAMUSCULAR | Status: DC

## 2015-06-11 MED ORDER — ACETAMINOPHEN 650 MG RE SUPP
650.0000 mg | Freq: Four times a day (QID) | RECTAL | Status: DC | PRN
Start: 2015-06-11 — End: 2015-06-20

## 2015-06-11 MED ORDER — IPRATROPIUM-ALBUTEROL 0.5-2.5 (3) MG/3ML IN SOLN
RESPIRATORY_TRACT | Status: AC
Start: 2015-06-11 — End: 2015-06-11
  Filled 2015-06-11: qty 3

## 2015-06-11 MED ORDER — ALBUTEROL SULFATE (2.5 MG/3ML) 0.083% IN NEBU
2.5000 mg | INHALATION_SOLUTION | Freq: Four times a day (QID) | RESPIRATORY_TRACT | Status: DC
  Administered 2015-06-11: 2.5 mg via RESPIRATORY_TRACT

## 2015-06-11 MED ORDER — SODIUM CHLORIDE 0.9 % IV BOLUS (SEPSIS)
2000.0000 mL | Freq: Once | INTRAVENOUS | Status: AC
  Administered 2015-06-11: 2000 mL via INTRAVENOUS

## 2015-06-11 NOTE — Evaluation (Signed)
Clinical/Bedside Swallow Evaluation Patient Details  Name: Lao Varcoe, MD MRN: [id number] Date of Birth: 10-07-1925  Today's Date: 06/11/2015 Time: SLP Start Time (ACUTE ONLY): [id number] SLP Stop Time (ACUTE ONLY): 1417 SLP Time Calculation (min) (ACUTE ONLY): 36 min  Past Medical History:  Past Medical History  Diagnosis Date  . CAD (coronary artery disease)   . Ischemic cardiomyopathy   . CHF (congestive heart failure) ([place])   . Dyslipidemia   . Diverticulosis of colon (without mention of hemorrhage)   . Diabetes mellitus   . Anemia, unspecified   . Unspecified disorder resulting from impaired renal function   . Chronic airway obstruction, not elsewhere classified     on o2 at night  . Prostatic hypertrophy     hx of  . Retention of urine, unspecified   . ICD (implantable cardiac defibrillator) in place     st jude  . Emphysema   . Hemorrhoids   . MI, old 2010 or 2011  . Macular degeneration   . Cataracts, bilateral    Past Surgical History:  Past Surgical History  Procedure Laterality Date  . Pci      4/11  . Cardiac defibrillator placement  2011    st jude  . Pacemaker insertion  2011  . Tonsillectomy  82 yo  . Cataract extraction     HPI:  82 y.o. male with a history of COPD on home O2, CAD s/p PCI 3 in 2011, cardiac arrest and VF s/p ICD, chronic systolic CHF last EF [id number] who presented with dyspnea, fever, and yellow sputum. Per pt and family report, coughing typically begins after PO intake. Barium swallow in February 2014 shows severe dysmotility and concern for stricture in the distal esophagus.   Assessment / Plan / Recommendation Clinical Impression  Pt has a h/o of esophageal dysphagia per prior barium swallow, and presently describes difficulty clearing "phlegm" after meals wtih associated trouble breathing. He has some coughing during PO intake that is initially reduced with Min cues from SLP for alternating bites/sips, but as intake continues he begins to  have more frequent coughing episodes. Frequent eructation is noted throughout intake. Pt's overall presentation is concerning for possible esophageal component, although secondary pharyngeal deficits cannot be excluded particularly in light of dyspnea associated with POs. Recommend to proceed with MBS on next date to better assess. Pt may have meds crushed in puree and ice chips until that time.    Aspiration Risk  Moderate aspiration risk    Diet Recommendation Ice chips PRN after oral care;NPO except meds   Medication Administration: Crushed with puree (followed by few sips of water)    Other  Recommendations Oral Care Recommendations: Oral care QID;Oral care prior to ice chip/H20   Follow up Recommendations   (tba)    Frequency and Duration            Prognosis        Swallow Study   General HPI: 82 y.o. male with a history of COPD on home O2, CAD s/p PCI 3 in 2011, cardiac arrest and VF s/p ICD, chronic systolic CHF last EF [id number] who presented with dyspnea, fever, and yellow sputum. Per pt and family report, coughing typically begins after PO intake. Barium swallow in February 2014 shows severe dysmotility and concern for stricture in the distal esophagus. Type of Study: Bedside Swallow Evaluation Previous Swallow Assessment: none in chart Diet Prior to this Study: Regular;Thin liquids Temperature Spikes Noted: Yes (104.1) Respiratory Status: Nasal cannula  History of Recent Intubation: No Behavior/Cognition: Alert;Cooperative;Pleasant mood;Other (Comment) (HOH, anxious about PO intake) Oral Cavity Assessment: Within Functional Limits Oral Care Completed by SLP: No Vision: Functional for self-feeding Self-Feeding Abilities: Able to feed self Patient Positioning: Upright in bed Baseline Vocal Quality: Normal Volitional Cough: Congested Volitional Swallow: Able to elicit    Oral/Motor/Sensory Function Overall Oral Motor/Sensory Function: Within functional limits   Ice Chips  Ice chips: Within functional limits Presentation: Spoon   Thin Liquid Thin Liquid: Impaired Presentation: Cup;Self Fed Pharyngeal  Phase Impairments: Cough - Delayed    Nectar Thick Nectar Thick Liquid: Not tested   Honey Thick Honey Thick Liquid: Not tested   Puree Puree: Impaired Presentation: Self Fed;Spoon Pharyngeal Phase Impairments: Cough - Delayed   Solid   GO   Solid: Not tested       Germain Osgood, M.A. CCC-SLP [phone]  Germain Osgood 06/11/2015,3:45 PM

## 2015-06-11 NOTE — ED Notes (Signed)
Pt to ED via [facility] with c/o resp distress.  EMS reports pt takes 3 breathing tx a day.  Enroute pt received Solu Medrol 125mg  IV   Albuterol 10 Atrovent 0.5  NTG 1 SL   By EMS   Pt was placed on C-Pap but would not tolerate

## 2015-06-11 NOTE — Progress Notes (Signed)
Sepsis - Repeat Assessment  Performed at:    5:32 AM  Vitals     Blood pressure 85/40, MAP 60, pulse 104, temperature 99 F, SpO2 97 %. Heart:     Inaudible Lungs:    Wheezes Capillary Refill:   Normal Skin:     No mottling     Persistent hypotension, sepsis pneumonia with shock.  30 cc/kg bolus administered already.  EF one week ago 55%.   D/w E-link.  Additional bolus x2L now, HC 100 mg IV, reassess in one hour.  Repeat lactate ordered.

## 2015-06-11 NOTE — H&P (Signed)
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  The patient started to develop cough productive of yellow sputum about a week ago accompanied by dyspnea.  He saw his Cardiologist who noted fluid overload, and prescribed Lasix for three days.  There was no fever and a CXR was negative at that time.  Symptoms persisted and he went to his PCP four days ago who prescribed prednisone, Abx? (he and family are not sure what), and increased albuterol.  Tonight at midnight, patient woke with chills, sweats, worsening cough and couldn't breathe and 9-1-1 was called.    ED course: -Presented in respiratory distress, requiring oxymask, febrile 104.92F rectally, tachycardic, BP 110/50 mmHg -Na 137, K 4.9, Cr 1.8 (baseline 1.2-1.6), WBC 4.6K, Hgb 9.1 (at baseline) -CXR without opacity -Lactate 2.95 mmol/L -Culture data were collected and broad spectrum empiric antibiotics and fluids were administered, then TRH were called to evaluate for admission     Review of Systems:  Pt complains of cough, chills, dyspnea, wheezing, yellow sputum, hematuria last week, constipation. Pt denies any abdominal pain, vomiting, leg swelling, ICD shocks, dysuria, pain with catheterization.  All other systems negative except as just noted or noted in the history of present illness.    Past Medical History  Diagnosis Date  . CAD (coronary artery disease)   . Ischemic cardiomyopathy   . CHF (congestive heart failure) ([place])   . Dyslipidemia   . Diverticulosis of colon (without mention of hemorrhage)   . Diabetes mellitus   .  Anemia, unspecified   . Unspecified disorder resulting from impaired renal function   . Chronic airway obstruction, not elsewhere classified     on o2 at night  . Prostatic hypertrophy     hx of  . Retention of urine, unspecified   . ICD (implantable cardiac defibrillator) in place     st jude  . Emphysema   . Hemorrhoids   . MI, old 2010 or 2011  . Macular degeneration   . Cataracts, bilateral     Past Surgical History  Procedure Laterality Date  . Pci      4/11  . Cardiac defibrillator placement  2011    st jude  . Pacemaker insertion  2011  . Tonsillectomy  82 yo  . Cataract extraction      Social History: Patient lives alone.  His daughter says he has 2 granddaughters living with him other home from college, although not clear who takes care of him and they are not home. Uses a wheelchair, walks short distances.  Smoked until 2011. 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 Is from [place] originally.  No Known Allergies  Family history: family history includes Atrial fibrillation in his daughter; Breast cancer in his sister; Congestive Heart Failure in his mother; Coronary artery disease in his mother; Heart attack in his brother; Lung cancer in his father; Stroke in his mother.  Prior to Admission medications   Medication Sig Start Date End Date Taking? Authorizing Provider  acetaminophen-codeine (TYLENOL #  3) 300-30 MG per tablet Take 1 tablet by mouth daily as needed. Pain 08/24/14   Historical Provider, MD  albuterol (PROVENTIL) (5 MG/ML) 0.5% nebulizer solution Take 2.5 mg by nebulization every 6 (six) hours as needed for wheezing or shortness of breath.    Historical Provider, MD  ALPRAZolam Duanne Moron) 0.25 MG tablet Take 0.25 mg by mouth as needed. As needed for sleep 02/13/15   Historical Provider, MD  arformoterol (BROVANA) 15 MCG/2ML NEBU Take 2 mLs (15 mcg total) by nebulization 2 (two) times daily. 08/31/12   Tammy S Parrett, NP  aspirin EC 81 MG  tablet Take 1 tablet (81 mg total) by mouth at bedtime. 11/27/13   Nishant Dhungel, MD  bisacodyl (DULCOLAX) 5 MG EC tablet Take 5 mg by mouth daily as needed for mild constipation or moderate constipation.    Historical Provider, MD  BOOSTRIX 5-2.5-18.5 injection Inject 0.5 mLs into the muscle once.  03/14/15   Historical Provider, MD  budesonide (PULMICORT) 0.5 MG/2ML nebulizer solution Take 2 mLs (0.5 mg total) by nebulization 2 (two) times daily. 08/31/12   Tammy S Parrett, NP  clopidogrel (PLAVIX) 75 MG tablet Take 75 mg by mouth daily.    Historical Provider, MD  famotidine (PEPCID) 20 MG tablet Take 20 mg by mouth at bedtime.  08/11/12   Historical Provider, MD  furosemide (LASIX) 20 MG tablet Take 1 tablet (20 mg total) by mouth daily. 06/06/15   Eileen Stanford, PA-C  HYDROcodone-acetaminophen (VICODIN) 2.5-500 MG per tablet Take 1 tablet by mouth every 6 (six) hours as needed for pain. 06/07/14   Wandra Arthurs, MD  losartan (COZAAR) 25 MG tablet Take 25 mg by mouth daily.    Historical Provider, MD  metoprolol tartrate (LOPRESSOR) 25 MG tablet Take 12.5 mg by mouth 2 (two) times daily. Reported on 06/06/2015    Historical Provider, MD  nitroGLYCERIN (NITROSTAT) 0.4 MG SL tablet Place 0.4 mg under the tongue every 5 (five) minutes as needed for chest pain. UP TO 3 DOSES THEN CALL EMS    Historical Provider, MD  rosuvastatin (CRESTOR) 5 MG tablet Take 5 mg by mouth daily.    Historical Provider, MD       Physical Exam: BP 110/48 mmHg  Pulse 104  Temp(Src) 104.1 F (40.1 C) (Rectal)  Resp 32  Ht 6' (1.829 m)  Wt 81.647 kg (180 lb)  BMI 24.41 kg/m2  SpO2 97% General appearance: Frail elderly adult male, alert and in moderate distress from dyspnea, appears fatigued.   Eyes: Anicteric, conjunctiva pink, lids and lashes normal.     ENT: No nasal deformity, discharge, or epistaxis. Condensation inside nose from neb. OP appears moist without lesions.   Lymph: No cervical or supraclavicular  lymphadenopathy. Skin: Hot and moist.  No mottling.  No jaundice.   Cardiac: Tachycardic, heart sounds obscured by wheezing.  Capillary refill is brisk.  Neck veins flat.   No LE edema.  Radial pulses 2+ and symmetric, DP hard to palpate. Respiratory: Tachypneic, diffuse wheezing with inspiration and expiration. Abdomen: Abdomen soft without rigidity.  No TTP. No ascites, distension.   MSK: No deformities or effusions. Neuro: Cranial nerves normal. Sensorium intact and responding to questions, attention normal, but very tired, appears to have mild dementia, oriented to person, place and time.  Speech is fluent.  Moves all extremities equally and with normal coordination, globally/symmetrically weak.    Psych: Behavior appropriate.  Affect tired.  No evidence of aural or visual hallucinations  or delusions.       Labs on Admission:  I have personally reviewed following labs and imaging studies: CBC:  Recent Labs Lab 06/06/15 1629 06/11/15 0145  WBC 6.7 4.6  NEUTROABS 5293 4.3  HGB 9.2* 9.1*  HCT 28.2* 29.2*  MCV 66.4* 67.9*  PLT 96* 98*   Basic Metabolic Panel:  Recent Labs Lab 06/06/15 1629 06/11/15 0145  NA 144 137  K 4.7 4.9  CL 107 104  CO2 28 22  GLUCOSE 93 116*  BUN 37* 53*  CREATININE 1.48* 1.82*  CALCIUM 8.5* 8.4*   GFR: Estimated Creatinine Clearance: 30.8 mL/min (by C-G formula based on Cr of 1.82). Liver Function Tests:  Recent Labs Lab 06/11/15 0145  AST 39  ALT 33  ALKPHOS 59  BILITOT 0.7  PROT 6.9  ALBUMIN 3.5   No results for input(s): LIPASE, AMYLASE in the last 168 hours. No results for input(s): AMMONIA in the last 168 hours. Coagulation Profile: No results for input(s): INR, PROTIME in the last 168 hours. Cardiac Enzymes: No results for input(s): CKTOTAL, CKMB, CKMBINDEX, TROPONINI in the last 168 hours. BNP (last 3 results) No results for input(s): PROBNP in the last 8760 hours. HbA1C: No results for input(s): HGBA1C in the last 72  hours. CBG: No results for input(s): GLUCAP in the last 168 hours. Lipid Profile: No results for input(s): CHOL, HDL, LDLCALC, TRIG, CHOLHDL, LDLDIRECT in the last 72 hours. Thyroid Function Tests: No results for input(s): TSH, T4TOTAL, FREET4, T3FREE, THYROIDAB in the last 72 hours. Anemia Panel: No results for input(s): VITAMINB12, FOLATE, FERRITIN, TIBC, IRON, RETICCTPCT in the last 72 hours. Sepsis Labs: Lactic acid 2.95 mmol/L @LABRCNTIP (procalcitonin:4,lacticidven:4) )No results found for this or any previous visit (from the past 240 hour(s)).       Radiological Exams on Admission: Personally reviewed: Dg Chest Port 1 View  06/11/2015  CLINICAL DATA:  Fever and cough. EXAM: PORTABLE CHEST 1 VIEW COMPARISON:  06/06/2015 FINDINGS: Cardiac pacemaker. Emphysematous changes in the lungs. Heart size and pulmonary vascularity are normal. No focal consolidation. No pneumothorax. Calcified and tortuous aorta. IMPRESSION: Emphysematous changes in the lungs. No evidence of active pulmonary disease. Electronically Signed   By: Lucienne Capers M.D.   On: 06/11/2015 02:19    EKG: Independently reviewed. Rate 106, QTc 414, sinus rhythm, no ST segment changes.   Echocardiogram 1 week ago: EF 0000000, grade I diastolic dysfunction, no significant valvular disease, mild AS.    Assessment/Plan 1. Sepsis from pneumonia causing acute on chronic respiratory failure with hypoxia:  Suspected source lung. Organism unknown. Patient meets criteria given tachycardia, tachypnea, fever, and evidence of organ dysfunction.  Lactate 2.95 mmol/L and repeat ordered within 6 hours.  This patient is at high risk of poor outcomes with a SOFA score of 2.  Antibiotics delivered in the ED.    -Sepsis bundle utilized:  -Blood and urine cultures drawn  -30 ml/kg bolus given in ED, will repeat lactic acid  -Start targeted antibiotics with ceftriaxone and azithromycin for CAP, based on suspected source of infection     -Repeat renal function and complete blood count in AM  -Code SEPSIS called to E-link -Procalcitonin elevated -Influenza panel pending -Check legionella and strep pneumo antigens   2. COPD exacerbation:  On home O2 -Continue home LABA and ICS -Scheduled and PRN nebulized bronchodilators -Solumedrol 60 mg daily  3. Chronic systolic CHF:  BNP 99991111 from 45 one week ago.   -Hold home furosemide, losartan, metoprolol until hemodynamically  stable -Strict I/Os and daily weights, may need diuresis after fluid resuscitation  4. CKD stage III:  -Monitor daily BMP  5. CAD s/p PCI:  -Continue statin, aspirin  6. Chronic microcytic anemia:  From thalassemia and chronic disease  7. PCM: -Nutrition consult      DVT prophylaxis: Lovenox  Code Status: FULL  Family Communication: Daughter and POA [name] present at the bedside, CODE STATUS confirmed. Disposition Plan: Anticipate IV fluids and antibiotics.  Likely admitted for several days and then PT evaluation for placement versus home with home health Consults called: None Admission status: Inpatient, stepdown   Medical decision making: Patient seen at 3:54 AM on 06/11/2015.  The patient was discussed with Dr. Leonides Schanz. What exists of the patient's chart was reviewed in depth.  Clinical condition: requiring supplemental oxygen and further fluid boluses with blood pressure in 0000000 and [id number] systolic, unstable for medical floor, but mentating well and not requiring pressors or invasive ventilation at present.        Edwin Dada [facility] Pager [phone]     At the time of admission, it appears that the appropriate admission status for this patient is INPATIENT. This is judged to be reasonable and necessary in order to provide the required intensity of service to ensure the patient's safety given the presenting symptoms, physical exam findings, and initial radiographic and laboratory data in the context of their  chronic comorbidities.  Together, these circumstances are felt to place her/him at high risk for further clinical deterioration threatening life, limb, or organ. The following factors support the admission status of inpatient:   A. The patient's presenting symptoms include cough, dyspnea, purulent sputum B. The worrisome physical exam findings include tachypnea, wheezes, hypoxia, hypotension C. The initial radiographic and laboratory data are worrisome because of elevated lactic acid, elevated creatinine slightly from baseline D. The chronic co-morbidities include CAD, CHF, COPD on home O2 i.e. Chronic respiratory failure with hypoxia, severe protein calorie malnutrition E. Patient requires inpatient status due to high intensity of service, high risk for further deterioration and high frequency of surveillance required. F. I certify that at the point of admission it is my clinical judgment that the patient will require inpatient hospital care spanning beyond 2 midnights from the point of admission.

## 2015-06-11 NOTE — ED Provider Notes (Signed)
By signing my name below, I, Altamease Oiler, attest that this documentation has been prepared under the direction and in the presence of Junior, DO. Electronically Signed: Altamease Oiler, ED Scribe. 06/11/2015. 3:36 AM.  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 The pt was diagnosed with bronchitis 4 days ago for his PCP and was administering a nebulizer treatment on EMS arrival. He had oxygen saturation of 85% on RA. A duoneb treatment, NTG, and solumedrol given by EMS. He was reportedly unable to tolerate CPAP by EMS.  Associated symptoms include chills and cough productive of yellow sputum. Pt denies chest pain and fever. He denies pain anywhere currently. Family does not think that he was put on antibiotics by his primary care provider. NKDA. Family at bedside reports patient is a full code.  ROS: See HPI Constitutional: no fever  Eyes: no drainage  ENT: no runny nose   Cardiovascular:  no chest pain  Resp: SOB  GI: no vomiting GU: no dysuria Integumentary: no rash  Allergy: no hives  Musculoskeletal: no leg swelling  Neurological: no slurred speech ROS otherwise negative  PAST MEDICAL HISTORY/PAST SURGICAL HISTORY:  Past Medical History  Diagnosis Date  . CAD (coronary artery disease)   . Ischemic cardiomyopathy   . CHF (congestive heart failure) ([place])   . Dyslipidemia   . Diverticulosis of colon (without mention of hemorrhage)   . Diabetes mellitus   . Anemia, unspecified   . Unspecified disorder resulting from impaired renal function   . Chronic airway obstruction, not elsewhere classified     on o2 at night  . Prostatic hypertrophy     hx of  . Retention of urine, unspecified   . ICD (implantable cardiac  defibrillator) in place     st jude  . Emphysema   . Hemorrhoids   . MI, old 2010 or 2011  . Macular degeneration   . Cataracts, bilateral     MEDICATIONS:  Prior to Admission medications   Medication Sig Start Date End Date Taking? Authorizing Provider  acetaminophen-codeine (TYLENOL #3) 300-30 MG per tablet Take 1 tablet by mouth daily as needed. Pain 08/24/14   Historical Provider, MD  albuterol (PROVENTIL) (5 MG/ML) 0.5% nebulizer solution Take 2.5 mg by nebulization every 6 (six) hours as needed for wheezing or shortness of breath.    Historical Provider, MD  ALPRAZolam Duanne Moron) 0.25 MG tablet Take 0.25 mg by mouth as needed. As needed for sleep 02/13/15   Historical Provider, MD  arformoterol (BROVANA) 15 MCG/2ML NEBU Take 2 mLs (15 mcg total) by nebulization 2 (two) times daily. 08/31/12   Tammy S Parrett, NP  aspirin EC 81 MG tablet Take 1 tablet (81 mg total) by mouth at bedtime. 11/27/13   Nishant Dhungel, MD  bisacodyl (DULCOLAX) 5 MG EC tablet Take 5 mg by mouth daily as needed for mild constipation or moderate constipation.    Historical Provider, MD  BOOSTRIX 5-2.5-18.5 injection Inject 0.5 mLs into the muscle once.  03/14/15   Historical Provider, MD  budesonide (PULMICORT) 0.5 MG/2ML nebulizer solution Take 2 mLs (0.5 mg total) by nebulization 2 (two) times daily. 08/31/12   Tammy S Parrett, NP  clopidogrel (PLAVIX) 75 MG tablet Take 75 mg by mouth  daily.    Historical Provider, MD  famotidine (PEPCID) 20 MG tablet Take 20 mg by mouth at bedtime.  08/11/12   Historical Provider, MD  furosemide (LASIX) 20 MG tablet Take 1 tablet (20 mg total) by mouth daily. 06/06/15   Eileen Stanford, PA-C  HYDROcodone-acetaminophen (VICODIN) 2.5-500 MG per tablet Take 1 tablet by mouth every 6 (six) hours as needed for pain. 06/07/14   Wandra Arthurs, MD  losartan (COZAAR) 25 MG tablet Take 25 mg by mouth daily.    Historical Provider, MD  metoprolol tartrate (LOPRESSOR) 25 MG tablet Take 12.5 mg by  mouth 2 (two) times daily. Reported on 06/06/2015    Historical Provider, MD  nitroGLYCERIN (NITROSTAT) 0.4 MG SL tablet Place 0.4 mg under the tongue every 5 (five) minutes as needed for chest pain. UP TO 3 DOSES THEN CALL EMS    Historical Provider, MD  rosuvastatin (CRESTOR) 5 MG tablet Take 5 mg by mouth daily.    Historical Provider, MD    ALLERGIES:  No Known Allergies  SOCIAL HISTORY:  Social History  Substance Use Topics  . Smoking status: Former Smoker    Types: Cigarettes    Quit date: 04/27/2009  . Smokeless tobacco: Never Used  . Alcohol Use: Yes     Comment: occasional beer    FAMILY HISTORY: Family History  Problem Relation Age of Onset  . Lung cancer Father     smoker  . Coronary artery disease Mother   . Stroke Mother   . Breast cancer Sister   . Atrial fibrillation Daughter   . Congestive Heart Failure Mother   . Heart attack Brother     EXAM: BP 117/54 mmHg  Pulse 101  Temp(Src) 102.9 F (39.4 C) (Axillary)  Resp 20  Ht 6' (1.829 m)  Wt 180 lb (81.647 kg)  BMI 24.41 kg/m2  SpO2 100% CONSTITUTIONAL: Alert, febrile, nontoxic appearing, in moderate respiratory distress, Elderly HEAD: Normocephalic EYES: Conjunctivae clear, PERRL ENT: normal nose; no rhinorrhea; moist mucous membranes NECK: Supple, no meningismus, no LAD  CARD: regular and tachycardic; S1 and S2 appreciated; no murmurs, no clicks, no rubs, no gallops RESP: In moderate respiratory distress, speaking in short sentences, diffuse expiratory wheezes, diminished at the bases bilaterally, hypoxic on room air, no rhonchi or rales appreciated  ABD/GI: Normal bowel sounds; non-distended; soft, non-tender, no rebound, no guarding, no peritoneal signs BACK:  The back appears normal and is non-tender to palpation, there is no CVA tenderness EXT: Normal ROM in all joints; non-tender to palpation; no edema; normal capillary refill; no cyanosis, no calf tenderness or swelling    SKIN: Normal color for  age and race; warm; no rash NEURO: Moves all extremities equally  MEDICAL DECISION MAKING: Patient here with respiratory distress, fever. He meets sepsis criteria. Broad-spectrum antibiotic started. Does have a history of CHF status post pacemaker. Given he is normotensive, will hydrate gently. Concern for possible pneumonia on x-ray in the left lower lobe. Labs, cultures, urine pending. He did have an influenza vaccination and pneumonia vaccination last year per family. He did receive Solu-Medrol with EMS. We'll give continuous albuterol, Atrovent. Will obtain an ABG.  ED PROGRESS: Rectal temp is 104.1. He has been given rectal Tylenol.  Blood gas shows a pH of 7.4, PCO2 of 36.5, PO2 of 100.  EKG shows no ischemic abnormality.   3:20 AM  Pt's blood pressure has dropped slightly but he is receiving IV fluids. His lungs have cleared markedly and  his respiratory rate has slowed. He reports feeling better. Labs unremarkable other than acute on chronic renal failure, elevated lactate in the setting of sepsis. He is getting hydrated. IV antibiotics have finished as well. Will discuss with hospitalist for admission. PCP is with [facility] physicians.   3:34 AM-Consult complete with Dr. Loleta Books (Hospitalist). Patient case explained and discussed. Agrees to admit patient for further evaluation and treatment.  He requests for me to put in an inpatient step down bed. Care is transferred to the Hospitalist's service. Call ended at 3:34 AM  Sepsis - Repeat Assessment  Performed at:    3:15 AM  Vitals     Blood pressure 110/48, pulse 104, temperature 104.1 F (40.1 C), temperature source Rectal, resp. rate 32, height 6' (1.829 m), weight 180 lb (81.647 kg), SpO2 97 %.  Heart:     Tachycardic  Lungs:    Wheezing  Capillary Refill:   <2 sec  Peripheral Pulse:   Radial pulse palpable  Skin:     Normal Color     EKG Interpretation  Date/Time:  Monday Jun 11 2015 01:45:35 EDT Ventricular Rate:  106 PR  Interval:  148 QRS Duration: 100 QT Interval:  312 QTC Calculation: 414 R Axis:   69 Text Interpretation:  Sinus tachycardia Ventricular premature complex Aberrant conduction of SV complex(es) Nonspecific repol abnormality, inferior leads Minimal ST elevation, lateral leads No significant change since last tracing Baseline wander Confirmed by Vermelle Cammarata,  DO, Derron Pipkins [id number]) on 06/11/2015 2:24:29 AM        CRITICAL CARE Performed by: Nyra Jabs   Total critical care time: 45 minutes  Critical care time was exclusive of separately billable procedures and treating other patients.  Critical care was necessary to treat or prevent imminent or life-threatening deterioration.  Critical care was time spent personally by me on the following activities: development of treatment plan with patient and/or surrogate as well as nursing, discussions with consultants, evaluation of patient's response to treatment, examination of patient, obtaining history from patient or surrogate, ordering and performing treatments and interventions, ordering and review of laboratory studies, ordering and review of radiographic studies, pulse oximetry and re-evaluation of patient's condition.   I personally performed the services described in this documentation, which was scribed in my presence. The recorded information has been reviewed and is accurate.      [place], DO 06/11/15 [phone]

## 2015-06-11 NOTE — ED Notes (Signed)
Sitting up in the bed - resting with eyes closed.  Resp unlabored at this time

## 2015-06-11 NOTE — ED Notes (Addendum)
Patient resting sitting straight up in the bed and started coughing and started getting anxious.  Resp at bedside, Dr Loleta Books aware  Dr Loleta Books stated "we need to get him up to step sooner than later"  3S nurse unable to take report at this time

## 2015-06-11 NOTE — Progress Notes (Signed)
[facility] TEAM 1 - Stepdown/ICU TEAM PROGRESS NOTE  [name], MD [id number] DOB: 1925/03/17 DOA: 06/11/2015 PCP: [name], MD  Admit HPI / Brief Narrative: 82 y.o. male with a history of COPD on home O2, CAD s/p PCI 3 in 2011, cardiac arrest and VF s/p ICD, chronic systolic CHF last EF [id number] who presented with dyspnea, fever, and yellow sputum.  The patient started to develop cough productive of yellow sputum about a week prior accompanied by dyspnea. He saw his Cardiologist who noted fluid overload, and prescribed Lasix for three days. There was no fever and a CXR was negative at that time.  Symptoms persisted and he went to his PCP four days prior who prescribed prednisone, Abx, and increased albuterol. The night of his admission the patient woke with chills, sweats, worsening cough, and he couldn't breathe.   In the ED the pt was in respiratory distress, and febrile to 104.52F rectally.  CXR without opacity.  Lactate 2.95.  HPI/Subjective: Pt seen for f/u visit.  Assessment/Plan:  Sepsis due to PNA v/s Pyelonephritis Lactate climbing - resume aggressive IVF resuscitation (note EF has recovered)  Acute on chronic hypoxic respiratory failure  Flu negative  COPD exacerbation On home O2  CAD s/p [id number]  Chronic systolic CHF- Ischemic Cardiomyopathy - AICD EF 55-60% w/ grade 1 DD via TTE 06/06/15   Mild AoS Per TTE 06/06/15  CKD stage III   Chronic microcytic anemia B thalassemia and chronic disease - baselin Hgb appears to be ~9  PCM  Code Status: FULL - pt clear he would desire "short term" vent support but not "long term"  Family Communication: spoke w/ pt and daughter at bedside at length Disposition Plan: SDU  Consultants: none  Procedures: none  Antibiotics: Zosyn 5/14 > Vanc 5/14 >  DVT prophylaxis: lovenox  Objective: Blood pressure 103/58, pulse 81, temperature 97.3 F (36.3 C), temperature source Axillary, resp. rate 25, height  6' (1.829 m), weight 85 kg (187 lb 6.3 oz), SpO2 98 %.  Intake/Output Summary (Last 24 hours) at 06/11/15 [id number] Last data filed at 06/11/15 0708  Gross per 24 hour  Intake   5250 ml  Output      0 ml  Net   5250 ml     Exam: Pt seen for f/u visit.  Data Reviewed: Basic Metabolic Panel:  Recent Labs Lab 06/06/15 1629 06/11/15 0145  NA 144 137  K 4.7 4.9  CL 107 104  CO2 28 22  GLUCOSE 93 116*  BUN 37* 53*  CREATININE 1.48* 1.82*  CALCIUM 8.5* 8.4*    CBC:  Recent Labs Lab 06/06/15 1629 06/11/15 0145 06/11/15 0741  WBC 6.7 4.6 16.4*  NEUTROABS 5293 4.3  --   HGB 9.2* 9.1* 7.7*  HCT 28.2* 29.2* 25.0*  MCV 66.4* 67.9* 68.1*  PLT 96* 98* 76*    Liver Function Tests:  Recent Labs Lab 06/11/15 0145  AST 39  ALT 33  ALKPHOS 59  BILITOT 0.7  PROT 6.9  ALBUMIN 3.5    Cardiac Enzymes: No results for input(s): CKTOTAL, CKMB, CKMBINDEX, TROPONINI in the last 168 hours.  Studies:   Recent x-ray studies have been reviewed in detail by the Attending Physician  Scheduled Meds:  Scheduled Meds: . albuterol  2.5 mg Nebulization Q6H  . aspirin EC  81 mg Oral QHS  . azithromycin  500 mg Intravenous Q24H  . cefTRIAXone (ROCEPHIN)  IV  1 g Intravenous Q24H  . clopidogrel  75 mg Oral  Daily  . enoxaparin (LOVENOX) injection  40 mg Subcutaneous Daily  . famotidine  20 mg Oral QHS  . ipratropium-albuterol      . methylPREDNISolone (SOLU-MEDROL) injection  60 mg Intravenous Daily  . rosuvastatin  5 mg Oral Daily  . sodium chloride flush  3 mL Intravenous Q12H    Time spent on care of this patient: No charge   Cherene Altes , MD   [facility] Office  [phone] Pager - Text Page per Amion as per below:  On-Call/Text Page:      [email or web address]      password [email or web address]  If 7PM-7AM, please contact night-coverage [email or web address] Password [email or web address] 06/11/2015, 8:22 AM   LOS: 0 days

## 2015-06-11 NOTE — Progress Notes (Signed)
CRITICAL VALUE ALERT  Critical value received:  Lactic Acid 2.7  Date of notification:  06/11/2015  Time of notification:  1203  Critical value read back:yes  Nurse who received alert:  Austin Miles  MD notified (1st page):  McClung  Time of first page:  1203  MD notified (2nd page):  Time of second page:  Responding MD:  Thereasa Solo  Time MD responded:

## 2015-06-11 NOTE — Progress Notes (Signed)
Initial Nutrition Assessment  DOCUMENTATION CODES:   Not applicable  INTERVENTION:  -Once diet is advanced, Provide Ensure Enlive TID. Each supplement provides 350 kcals and 20 grams of protein.   NUTRITION DIAGNOSIS:   Inadequate oral intake related to poor appetite as evidenced by per patient/family report.  GOAL:   Patient will meet greater than or equal to 90% of their needs  MONITOR:   Supplement acceptance, PO intake, Diet advancement, Labs, Weight trends, Skin, I & O's  REASON FOR ASSESSMENT:   Consult Assessment of nutrition requirement/status  ASSESSMENT:   Pt with a past medical history significant for COPD on home O2, CAD s/p PCI 3 in 2011, cardiac arrest and VF s/p ICD, chronic systolic CHF last EF [id number] who presents with dyspnea, fever, and yellow sputum.  Pt hard of hearing with difficulty processing questions.  Pt weight is stable. Pt reports UBW of 176-180 lbs. Current weight 187 lbs.  Pt reports decreased intake PTA. He reports poor appetite and difficulty eating d/t labored breathing.  Pt reports being told to eat a soft diet to prevent labored breathing.  Pt denies N/V.  Pt is amenable to drinking Ensure. Will order TID once diet is advanced.  NFPE: No fat depletion, mild-moderate muscle depletion, no edema.  Labs reviewed; creat 1.9, GFR 30.  Meds reviewed.   Diet Order:  Diet NPO time specified Except for: Ice Chips  Skin:  Wound (see comment) (Skin tear on elbow)  Last BM:  5/12  Height:   Ht Readings from Last 1 Encounters:  06/11/15 6' (1.829 m)    Weight:   Wt Readings from Last 1 Encounters:  06/11/15 187 lb 6.3 oz (85 kg)    Ideal Body Weight:  80.9 kg  BMI:  Body mass index is 25.41 kg/(m^2).  Estimated Nutritional Needs:   Kcal:  1800-2000  Protein:  115-125   Fluid:  2 L  EDUCATION NEEDS:   No education needs identified at this time  Geoffery Lyons, [facility]etic Intern Pager [phone]

## 2015-06-11 NOTE — ED Notes (Signed)
Pt receiving Neb at this time and tolerating well.  Family at bedside.

## 2015-06-11 NOTE — ED Notes (Addendum)
Dr Loleta Books was called to come see patient  Reported  Lactic 2.44  Family stated he was having another one of the attacks when he cannot breath.  Patient restless, moving about in the bed gasping for air.

## 2015-06-11 NOTE — Progress Notes (Signed)
BCID results were discussed with Dr. Thereasa Solo: Change ceftriaxone to 2gm IV q24hr and discontinue azithromycin. Further changes once   -blood cultures: GNR 2/2; BCID- E coli  Hildred Laser, Pharm D 06/11/2015 6:33 PM

## 2015-06-11 NOTE — ED Notes (Signed)
Patient sitting up resting at this time.

## 2015-06-11 NOTE — ED Notes (Signed)
Called [facility] to activate code sepsis  

## 2015-06-12 ENCOUNTER — Inpatient Hospital Stay (HOSPITAL_COMMUNITY): Payer: Medicare Other

## 2015-06-12 DIAGNOSIS — J441 Chronic obstructive pulmonary disease with (acute) exacerbation: Secondary | ICD-10-CM

## 2015-06-12 DIAGNOSIS — A4151 Sepsis due to Escherichia coli [E. coli]: Principal | ICD-10-CM

## 2015-06-12 DIAGNOSIS — N183 Chronic kidney disease, stage 3 (moderate): Secondary | ICD-10-CM

## 2015-06-12 DIAGNOSIS — J9601 Acute respiratory failure with hypoxia: Secondary | ICD-10-CM

## 2015-06-12 DIAGNOSIS — D638 Anemia in other chronic diseases classified elsewhere: Secondary | ICD-10-CM

## 2015-06-12 LAB — COMPREHENSIVE METABOLIC PANEL
ALBUMIN: 2.7 g/dL — AB (ref 3.5–5.0)
ALT: 53 U/L (ref 17–63)
AST: 31 U/L (ref 15–41)
Alkaline Phosphatase: 42 U/L (ref 38–126)
Anion gap: 10 (ref 5–15)
BILIRUBIN TOTAL: 0.3 mg/dL (ref 0.3–1.2)
BUN: 45 mg/dL — AB (ref 6–20)
CHLORIDE: 111 mmol/L (ref 101–111)
CO2: 21 mmol/L — ABNORMAL LOW (ref 22–32)
Calcium: 7.7 mg/dL — ABNORMAL LOW (ref 8.9–10.3)
Creatinine, Ser: 1.57 mg/dL — ABNORMAL HIGH (ref 0.61–1.24)
GFR calc Af Amer: 44 mL/min — ABNORMAL LOW (ref >60)
GFR calc non Af Amer: 38 mL/min — ABNORMAL LOW (ref >60)
GLUCOSE: 167 mg/dL — AB (ref 65–99)
POTASSIUM: 5.2 mmol/L — AB (ref 3.5–5.1)
Sodium: 142 mmol/L (ref 135–145)
Total Protein: 5.4 g/dL — ABNORMAL LOW (ref 6.5–8.1)

## 2015-06-12 LAB — CBC
HEMATOCRIT: 23.4 % — AB (ref 39.0–52.0)
Hemoglobin: 7.5 g/dL — ABNORMAL LOW (ref 13.0–17.0)
MCH: 21.9 pg — ABNORMAL LOW (ref 26.0–34.0)
MCHC: 32.1 g/dL (ref 30.0–36.0)
MCV: 68.4 fL — AB (ref 78.0–100.0)
Platelets: 76 10*3/uL — ABNORMAL LOW (ref 150–400)
RBC: 3.42 MIL/uL — ABNORMAL LOW (ref 4.22–5.81)
RDW: 18.7 % — AB (ref 11.5–15.5)
WBC: 10.9 10*3/uL — ABNORMAL HIGH (ref 4.0–10.5)

## 2015-06-12 LAB — EXPECTORATED SPUTUM ASSESSMENT W REFEX TO RESP CULTURE

## 2015-06-12 LAB — EXPECTORATED SPUTUM ASSESSMENT W GRAM STAIN, RFLX TO RESP C

## 2015-06-12 LAB — LEGIONELLA PNEUMOPHILA SEROGP 1 UR AG: L. PNEUMOPHILA SEROGP 1 UR AG: NEGATIVE

## 2015-06-12 LAB — LACTIC ACID, PLASMA: Lactic Acid, Venous: 1.8 mmol/L (ref 0.5–2.0)

## 2015-06-12 MED ORDER — VITAMIN B-12 5000 MCG PO TBDP: 5000.0000 ug | ORAL_TABLET | Freq: Every day | ORAL | Status: DC

## 2015-06-12 MED ORDER — POLYETHYLENE GLYCOL 3350 17 G PO PACK
17.0000 g | PACK | Freq: Every day | ORAL | Status: DC
  Administered 2015-06-13 – 2015-06-19 (×4): 17 g via ORAL
  Filled 2015-06-12 (×7): qty 1

## 2015-06-12 MED ORDER — ALBUTEROL SULFATE (2.5 MG/3ML) 0.083% IN NEBU
2.5000 mg | INHALATION_SOLUTION | Freq: Four times a day (QID) | RESPIRATORY_TRACT | Status: DC
  Administered 2015-06-12: 2.5 mg via RESPIRATORY_TRACT
  Filled 2015-06-12: qty 3

## 2015-06-12 MED ORDER — ALBUTEROL SULFATE (2.5 MG/3ML) 0.083% IN NEBU: 2.5000 mg | INHALATION_SOLUTION | Freq: Once | RESPIRATORY_TRACT | Status: DC

## 2015-06-12 MED ORDER — BISACODYL 10 MG RE SUPP: 10.0000 mg | Freq: Every day | RECTAL | Status: DC | PRN

## 2015-06-12 MED ORDER — ALPRAZOLAM 0.25 MG PO TABS: 0.2500 mg | ORAL_TABLET | Freq: Three times a day (TID) | ORAL | Status: DC | PRN

## 2015-06-12 MED ORDER — IPRATROPIUM-ALBUTEROL 0.5-2.5 (3) MG/3ML IN SOLN
3.0000 mL | Freq: Four times a day (QID) | RESPIRATORY_TRACT | Status: DC
  Administered 2015-06-12 – 2015-06-20 (×29): 3 mL via RESPIRATORY_TRACT
  Filled 2015-06-12 (×30): qty 3

## 2015-06-12 MED ORDER — LORAZEPAM 2 MG/ML IJ SOLN
0.5000 mg | INTRAMUSCULAR | Status: DC | PRN
  Administered 2015-06-12 – 2015-06-18 (×6): 1 mg via INTRAVENOUS
  Filled 2015-06-12 (×6): qty 1

## 2015-06-12 MED ORDER — HYDROCOD POLST-CPM POLST ER 10-8 MG/5ML PO SUER
5.0000 mL | Freq: Two times a day (BID) | ORAL | Status: DC | PRN
  Administered 2015-06-15: 5 mL via ORAL
  Filled 2015-06-12: qty 5

## 2015-06-12 MED ORDER — SENNOSIDES-DOCUSATE SODIUM 8.6-50 MG PO TABS
1.0000 | ORAL_TABLET | Freq: Two times a day (BID) | ORAL | Status: DC
  Administered 2015-06-12 – 2015-06-20 (×8): 1 via ORAL
  Filled 2015-06-12 (×14): qty 1

## 2015-06-12 MED ORDER — IPRATROPIUM BROMIDE 0.02 % IN SOLN
0.5000 mg | Freq: Four times a day (QID) | RESPIRATORY_TRACT | Status: DC
  Administered 2015-06-12: 0.5 mg via RESPIRATORY_TRACT
  Filled 2015-06-12: qty 2.5

## 2015-06-12 MED ORDER — MAGNESIUM CITRATE PO SOLN
0.5000 | Freq: Once | ORAL | Status: DC | PRN
  Filled 2015-06-12: qty 296

## 2015-06-12 MED ORDER — VITAMIN B-12 1000 MCG PO TABS
5000.0000 ug | ORAL_TABLET | Freq: Every day | ORAL | Status: DC
  Administered 2015-06-13 – 2015-06-20 (×8): 5000 ug via ORAL
  Filled 2015-06-12 (×8): qty 5

## 2015-06-12 MED ORDER — FUROSEMIDE 10 MG/ML IJ SOLN
40.0000 mg | Freq: Once | INTRAMUSCULAR | Status: AC
  Administered 2015-06-12: 40 mg via INTRAVENOUS
  Filled 2015-06-12: qty 4

## 2015-06-12 MED ORDER — ENOXAPARIN SODIUM 40 MG/0.4ML ~~LOC~~ SOLN
40.0000 mg | SUBCUTANEOUS | Status: DC
  Administered 2015-06-12 – 2015-06-19 (×7): 40 mg via SUBCUTANEOUS
  Filled 2015-06-12 (×7): qty 0.4

## 2015-06-12 NOTE — Progress Notes (Signed)
[facility] TEAM 1 - Stepdown/ICU TEAM PROGRESS NOTE  [name], MD [id number] DOB: 1925-12-28 DOA: 06/11/2015 PCP: [name], MD  Admit HPI / Brief Narrative: 82 y.o. male with a history of COPD on home O2, CAD s/p PCI 3 in 2011, cardiac arrest and VF s/p ICD, chronic systolic CHF last EF [id number] who presented with dyspnea, fever, and yellow sputum.  The patient started to develop cough productive of yellow sputum about a week prior accompanied by dyspnea. He saw his Cardiologist who noted fluid overload, and prescribed Lasix for three days. There was no fever and a CXR was negative at that time.  Symptoms persisted and he went to his PCP four days prior who prescribed prednisone, Abx, and increased albuterol. The night of his admission the patient woke with chills, sweats, worsening cough, and he couldn't breathe.   In the ED the pt was in respiratory distress, and febrile to 104.58F rectally.  CXR without opacity.  Lactate 2.95.  HPI/Subjective: The patient has been dealing with significant shortness of breath and panic associated with this off and on throughout the morning.  He denies chest pain nausea vomiting or abdominal pain.  He does complain of constipation.  Assessment/Plan:  Sepsis w/ bacteremia due to E coli Pyelonephritis Lactate began to climb again, requiring increased IVF yesterday - now appears to be stabilizing and trending toward volume overload - slow IVF - recheck lactate to confirm has normalized - gently diurese   Acute bronchitis - PNA less likely - Acute on chronic hypoxic respiratory failure  Flu negative - sats now 100% but requiring higher level O2 support - stop IVF - bronchial hygeine - cont abx - flutter valve - address episodic anxiety   COPD exacerbation On home O2 - wheezing much improved today - taper steroids asap  CAD s/p PCI2011 Asymptomatic   Chronic systolic CHF- Ischemic Cardiomyopathy - AICD EF 55-60% w/ grade 1 DD via TTE 06/06/15   Filed Weights   06/11/15 0147 06/11/15 0743 06/12/15 0422  Weight: 81.647 kg (180 lb) 85 kg (187 lb 6.3 oz) 85.2 kg (187 lb 13.3 oz)    Mild AoS Per TTE 06/06/15  CKD stage III   Recent Labs Lab 06/06/15 1629 06/11/15 0145 06/11/15 0741 06/12/15 0432  CREATININE 1.48* 1.82* 1.90* 1.57*    Chronic microcytic anemia B thalassemia and chronic disease - baseline Hgb appears to be ~9 - diluted presently w/ aggressive volume expansion - follow w/ diuresis - no evidence of blood loss   Recent Labs Lab 06/06/15 1629 06/11/15 0145 06/11/15 0741 06/12/15 0432  HGB 9.2* 9.1* 7.7* 7.5*    PCM  Code Status: FULL - pt clear he would desire "short term" vent support but not "long term"  Family Communication: spoke w/ pt and son at bedside  Disposition Plan: SDU  Consultants: none  Procedures: none  Antibiotics: Zosyn 5/14 Vanc 5/14 Rocephin 5/15 >  DVT prophylaxis: lovenox  Objective: Blood pressure 107/64, pulse 82, temperature 97.7 F (36.5 C), temperature source Oral, resp. rate 24, height 6' (1.829 m), weight 85.2 kg (187 lb 13.3 oz), SpO2 100 %.  Intake/Output Summary (Last 24 hours) at 06/12/15 1134 Last data filed at 06/12/15 0811  Gross per 24 hour  Intake     50 ml  Output   1400 ml  Net  -1350 ml    Exam: General: requiring face mask O2 - anxious  Lungs: Fine crackles throughout with some expiratory wheezing Cardiovascular: Regular rate and rhythm without  murmur gallop or rub normal S1 and S2 Abdomen: Nontender, nondistended, soft, bowel sounds positive, no rebound, no ascites, no appreciable mass Extremities: No significant cyanosis, or clubbing - 1+ edema bilateral lower and upper extremities   Data Reviewed: Basic Metabolic Panel:  Recent Labs Lab 06/06/15 1629 06/11/15 0145 06/11/15 0741 06/12/15 0432  NA 144 137  --  142  K 4.7 4.9  --  5.2*  CL 107 104  --  111  CO2 28 22  --  21*  GLUCOSE 93 116*  --  167*  BUN 37* 53*  --  45*   CREATININE 1.48* 1.82* 1.90* 1.57*  CALCIUM 8.5* 8.4*  --  7.7*    CBC:  Recent Labs Lab 06/06/15 1629 06/11/15 0145 06/11/15 0741 06/12/15 0432  WBC 6.7 4.6 16.4* 10.9*  NEUTROABS 5293 4.3  --   --   HGB 9.2* 9.1* 7.7* 7.5*  HCT 28.2* 29.2* 25.0* 23.4*  MCV 66.4* 67.9* 68.1* 68.4*  PLT 96* 98* 76* 76*    Liver Function Tests:  Recent Labs Lab 06/11/15 0145 06/12/15 0432  AST 39 31  ALT 33 53  ALKPHOS 59 42  BILITOT 0.7 0.3  PROT 6.9 5.4*  ALBUMIN 3.5 2.7*    Cardiac Enzymes: No results for input(s): CKTOTAL, CKMB, CKMBINDEX, TROPONINI in the last 168 hours.  Studies:   Recent x-ray studies have been reviewed in detail by the Attending Physician  Scheduled Meds:  Scheduled Meds: . albuterol  2.5 mg Nebulization QID  . albuterol  2.5 mg Nebulization Once  . aspirin EC  81 mg Oral QHS  . budesonide  0.5 mg Nebulization BID  . cefTRIAXone (ROCEPHIN)  IV  2 g Intravenous Q24H  . clopidogrel  75 mg Oral Daily  . famotidine  20 mg Oral QHS  . guaiFENesin  600 mg Oral BID  . methylPREDNISolone (SOLU-MEDROL) injection  60 mg Intravenous Q12H  . rosuvastatin  5 mg Oral Daily  . sodium chloride flush  3 mL Intravenous Q12H    Time spent on care of this patient: 35 mins   MCCLUNG,JEFFREY T , MD   [facility] Office  [phone] Pager - Text Page per Shea Evans as per below:  On-Call/Text Page:      [email or web address]      password [email or web address]  If 7PM-7AM, please contact night-coverage [email or web address] Password [email or web address] 06/12/2015, 11:34 AM   LOS: 1 day

## 2015-06-12 NOTE — Progress Notes (Signed)
MBSS complete. Full report located under chart review in imaging section.  Yadriel Kerrigan Paiewonsky, M.A. CCC-SLP [phone]  

## 2015-06-13 DIAGNOSIS — B9620 Unspecified Escherichia coli [E. coli] as the cause of diseases classified elsewhere: Secondary | ICD-10-CM | POA: Diagnosis present

## 2015-06-13 DIAGNOSIS — R1310 Dysphagia, unspecified: Secondary | ICD-10-CM | POA: Diagnosis present

## 2015-06-13 DIAGNOSIS — N183 Chronic kidney disease, stage 3 unspecified: Secondary | ICD-10-CM | POA: Diagnosis present

## 2015-06-13 DIAGNOSIS — A4151 Sepsis due to Escherichia coli [E. coli]: Secondary | ICD-10-CM | POA: Diagnosis present

## 2015-06-13 DIAGNOSIS — I255 Ischemic cardiomyopathy: Secondary | ICD-10-CM | POA: Diagnosis present

## 2015-06-13 DIAGNOSIS — N12 Tubulo-interstitial nephritis, not specified as acute or chronic: Secondary | ICD-10-CM

## 2015-06-13 DIAGNOSIS — R7881 Bacteremia: Secondary | ICD-10-CM | POA: Diagnosis present

## 2015-06-13 LAB — PROCALCITONIN: Procalcitonin: 41.26 ng/mL

## 2015-06-13 LAB — CULTURE, BLOOD (ROUTINE X 2)

## 2015-06-13 LAB — CBC
HCT: 25.7 % — ABNORMAL LOW (ref 39.0–52.0)
HEMOGLOBIN: 8 g/dL — AB (ref 13.0–17.0)
MCH: 21.2 pg — AB (ref 26.0–34.0)
MCHC: 31.1 g/dL (ref 30.0–36.0)
MCV: 68.2 fL — AB (ref 78.0–100.0)
Platelets: 90 10*3/uL — ABNORMAL LOW (ref 150–400)
RBC: 3.77 MIL/uL — AB (ref 4.22–5.81)
RDW: 18.6 % — ABNORMAL HIGH (ref 11.5–15.5)
WBC: 7.2 10*3/uL (ref 4.0–10.5)

## 2015-06-13 LAB — COMPREHENSIVE METABOLIC PANEL
ALBUMIN: 2.8 g/dL — AB (ref 3.5–5.0)
ALK PHOS: 42 U/L (ref 38–126)
ALT: 47 U/L (ref 17–63)
ANION GAP: 12 (ref 5–15)
AST: 29 U/L (ref 15–41)
BUN: 54 mg/dL — AB (ref 6–20)
CALCIUM: 8.1 mg/dL — AB (ref 8.9–10.3)
CO2: 20 mmol/L — AB (ref 22–32)
Chloride: 111 mmol/L (ref 101–111)
Creatinine, Ser: 1.6 mg/dL — ABNORMAL HIGH (ref 0.61–1.24)
GFR calc Af Amer: 43 mL/min — ABNORMAL LOW (ref >60)
GFR calc non Af Amer: 37 mL/min — ABNORMAL LOW (ref >60)
GLUCOSE: 183 mg/dL — AB (ref 65–99)
Potassium: 5 mmol/L (ref 3.5–5.1)
SODIUM: 143 mmol/L (ref 135–145)
Total Bilirubin: 0.5 mg/dL (ref 0.3–1.2)
Total Protein: 5.6 g/dL — ABNORMAL LOW (ref 6.5–8.1)

## 2015-06-13 LAB — URINE CULTURE

## 2015-06-13 MED ORDER — PANTOPRAZOLE SODIUM 40 MG IV SOLR
40.0000 mg | Freq: Two times a day (BID) | INTRAVENOUS | Status: DC
  Administered 2015-06-13: 40 mg via INTRAVENOUS
  Filled 2015-06-13: qty 40

## 2015-06-13 NOTE — Progress Notes (Signed)
Speech Language Pathology Treatment: Dysphagia  Patient Details Name: [name] Alire, MD MRN: [id number] DOB: 14-Feb-1925 Today's Date: 06/13/2015 Time: [id number] SLP Time Calculation (min) (ACUTE ONLY): 14 min  Assessment / Plan / Recommendation Clinical Impression  Pt reports improved tolerance and less coughing on full liquid diet. He recalls esophageal and aspiration precautions from yesterday with Min cues. Delayed cough noted after thin liquid trials, which per MBS on previous date was not related to aspiration but was suspected to be due to bolus remaining in the esophagus. Instructed pt to remain in an upright position. Given tolerance of full liquid diet, will advance to Dys 1 textures and thin liquids.   HPI HPI: 82 y.o. male with a history of COPD on home O2, CAD s/p PCI 3 in 2011, cardiac arrest and VF s/p ICD, chronic systolic CHF last EF [id number] who presented with dyspnea, fever, and yellow sputum. Per pt and family report, coughing typically begins after PO intake. Barium swallow in February 2014 shows severe dysmotility and concern for stricture in the distal esophagus.      SLP Plan  Continue with current plan of care     Recommendations  Diet recommendations: Dysphagia 1 (puree);Thin liquid Liquids provided via: Cup;Straw Medication Administration: Crushed with puree Supervision: Patient able to self feed;Full supervision/cueing for compensatory strategies Compensations: Slow rate;Small sips/bites;Follow solids with liquid Postural Changes and/or Swallow Maneuvers: Seated upright 90 degrees;Upright 30-60 min after meal             Oral Care Recommendations: Oral care BID Follow up Recommendations: Home health SLP Plan: Continue with current plan of care     GO               Germain Osgood, M.A. CCC-SLP [phone]  Germain Osgood 06/13/2015, 3:12 PM

## 2015-06-13 NOTE — Progress Notes (Signed)
PROGRESS NOTE    [name] Azure, MD  [id number] DOB: 1925-05-07 DOA: 06/11/2015 PCP: [name], MD   Brief Narrative:  82 y.o. WM ([facility]) PMHx COPD on home O2, CAD s/p PCI 3 in 2011, cardiac arrest and VF s/p ICD, chronic systolic CHF last EF [id number]   Presented with dyspnea, fever, and yellow sputum. The patient started to develop cough productive of yellow sputum about a week prior accompanied by dyspnea. He saw his Cardiologist who noted fluid overload, and prescribed Lasix for three days. There was no fever and a CXR was negative at that time. Symptoms persisted and he went to his PCP four days prior who prescribed prednisone, Abx, and increased albuterol. The night of his admission the patient woke with chills, sweats, worsening cough, and he couldn't breathe.   In the ED the pt was in respiratory distress, and febrile to 104.63F rectally. CXR without opacity. Lactate 2.95.   Assessment & Plan:   Principal Problem:   Sepsis ([place]) Active Problems:   Anemia of chronic disease   Chronic kidney disease, stage 3   Automatic implantable cardioverter-defibrillator in situ   Chronic systolic CHF (congestive heart failure) (HCC)   Acute respiratory failure (HCC)   COPD exacerbation (HCC)   Severe protein-calorie malnutrition (HCC)   Sepsis due to Escherichia coli (E. coli) (HCC)   Bacteremia, escherichia coli   Pyelonephritis due to Escherichia coli   Dysphagia   Cardiomyopathy, ischemic   CKD (chronic kidney disease), stage III   Sepsis secondary w/positive E coli bacteremia due to positive E coli Pyelonephritis -Lactic acidosis resolved -DuoNeb QID -Pulmicort BID -Solu-Medrol 60 mg BID -Flutter valve -Will require 2 weeks antibiotics  Acute bronchitis - PNA less likely - Acute on chronic hypoxic respiratory failure  -Flu negative - sats now 100% but requiring higher level O2 support  -See sepsis  COPD exacerbation -On home 2.5 L O2 at night  -Continue  wheezing see sepsis  Primary Esophageal Dysphagia -After patient's respiratory status improved will consult GI prior to discharge possible stricture?  CAD s/p PCI2011 -Asymptomatic   Chronic systolic CHF- Ischemic Cardiomyopathy - AICD EF 55-60% w/ grade 1 DD via TTE 06/06/15 -Daily weight Filed Weights   06/11/15 0743 06/12/15 0422 06/13/15 0409  Weight: 85 kg (187 lb 6.3 oz) 85.2 kg (187 lb 13.3 oz) 84.9 kg (187 lb 2.7 oz)  -Strict in and out since admission +3.8 L -Transfuse for hemoglobin<8   Mild AoS -Per TTE 06/06/15 -See CHF  CKD stage III  Lab Results  Component Value Date   CREATININE 1.60* 06/13/2015   CREATININE 1.57* 06/12/2015   CREATININE 1.90* 06/11/2015   Chronic microcytic anemia(baseline Hgb appears to be ~9 ) -B thalassemia and chronic disease   Recent Labs Lab 06/11/15 0145 06/11/15 0741 06/12/15 0432 06/13/15 0501  HGB 9.1* 7.7* 7.5* 8.0*     DVT prophylaxis:Lovenox Code Status: FULL - pt clear he would desire "short term" vent support but not "long term"  Family Communication: Daughter at bedside Disposition Plan: Resolution sepsis   Consultants:  NA  Procedures/Significant Events:  5/10 echocardiogram;Left ventricle: mild LVH. -LVEF= 55% to 60%. -(grade 1 diastolic dysfunction).  5/16 barium swallow; primary esophageal dysphagia. Incomplete clearance barium through his esophagus. -Moderate aspiration risk  Cultures 5/15 blood right forearm 2 positive Escherichia coli 5/15 urine positive Escherichia coli 5/15 influenza panel negative  5/16 sputum pending   Antimicrobials: Zosyn 5/15 1 dose Vanc 5/15 1 dose Azithromycin 5/151 dose Rocephin 5/15 >>  Devices    LINES / TUBES:      Continuous Infusions: . sodium chloride 10 mL/hr at 06/12/15 1137     Subjective: 5/17 A/O 4, states normally on 2.5 L O2 at home at night. States ambulates minimally (example from garage to car) the rest of the time will use  wheelchair.   Objective: Filed Vitals:   06/13/15 1149 06/13/15 1159 06/13/15 1516 06/13/15 1523  BP: 143/84  148/73   Pulse: 82  43   Temp: 98.5 F (36.9 C)  97.9 F (36.6 C)   TempSrc: Oral  Oral   Resp: 25  23   Height:      Weight:      SpO2: 100% 95% 99% 96%    Intake/Output Summary (Last 24 hours) at 06/13/15 1901 Last data filed at 06/13/15 1521  Gross per 24 hour  Intake      0 ml  Output   3250 ml  Net  -3250 ml   Filed Weights   06/11/15 0743 06/12/15 0422 06/13/15 0409  Weight: 85 kg (187 lb 6.3 oz) 85.2 kg (187 lb 13.3 oz) 84.9 kg (187 lb 2.7 oz)    Examination:  General: A/O 4, positive acute on chronic respiratory distress Eyes: negative scleral hemorrhage, negative anisocoria, negative icterus ENT: Negative Runny nose, negative gingival bleeding, Neck:  Negative scars, masses, torticollis, lymphadenopathy, JVD Lungs:  positive rhonchi, with positive productive cough (clear), positive wheezes, negative crackles  Cardiovascular: Regular rate and rhythm without murmur gallop or rub normal S1 and S2 Abdomen: negative abdominal pain, nondistended, positive soft, bowel sounds, no rebound, no ascites, no appreciable mass Extremities: No significant cyanosis, clubbing, or edema bilateral lower extremities Skin: Negative rashes, lesions, ulcers Psychiatric:  Negative depression, negative anxiety, negative fatigue, negative mania Central nervous system:  Cranial nerves II through XII intact, tongue/uvula midline, all extremities muscle strength 5/5, sensation intact throughout, finger nose finger bilateral within normal limits, quick finger touch bilateral within normal limits,  negative dysarthria, negative expressive aphasia, negative receptive aphasia.  .     Data Reviewed: Care during the described time interval was provided by me .  I have reviewed this patient's available data, including medical history, events of note, physical examination, and all test  results as part of my evaluation. I have personally reviewed and interpreted all radiology studies.  CBC:  Recent Labs Lab 06/11/15 0145 06/11/15 0741 06/12/15 0432 06/13/15 0501  WBC 4.6 16.4* 10.9* 7.2  NEUTROABS 4.3  --   --   --   HGB 9.1* 7.7* 7.5* 8.0*  HCT 29.2* 25.0* 23.4* 25.7*  MCV 67.9* 68.1* 68.4* 68.2*  PLT 98* 76* 76* 90*   Basic Metabolic Panel:  Recent Labs Lab 06/11/15 0145 06/11/15 0741 06/12/15 0432 06/13/15 0501  NA 137  --  142 143  K 4.9  --  5.2* 5.0  CL 104  --  111 111  CO2 22  --  21* 20*  GLUCOSE 116*  --  167* 183*  BUN 53*  --  45* 54*  CREATININE 1.82* 1.90* 1.57* 1.60*  CALCIUM 8.4*  --  7.7* 8.1*   GFR: Estimated Creatinine Clearance: 35 mL/min (by C-G formula based on Cr of 1.6). Liver Function Tests:  Recent Labs Lab 06/11/15 0145 06/12/15 0432 06/13/15 0501  AST 39 31 29  ALT 33 53 47  ALKPHOS 59 42 42  BILITOT 0.7 0.3 0.5  PROT 6.9 5.4* 5.6*  ALBUMIN 3.5 2.7* 2.8*   No  results for input(s): LIPASE, AMYLASE in the last 168 hours. No results for input(s): AMMONIA in the last 168 hours. Coagulation Profile: No results for input(s): INR, PROTIME in the last 168 hours. Cardiac Enzymes: No results for input(s): CKTOTAL, CKMB, CKMBINDEX, TROPONINI in the last 168 hours. BNP (last 3 results) No results for input(s): PROBNP in the last 8760 hours. HbA1C: No results for input(s): HGBA1C in the last 72 hours. CBG: No results for input(s): GLUCAP in the last 168 hours. Lipid Profile: No results for input(s): CHOL, HDL, LDLCALC, TRIG, CHOLHDL, LDLDIRECT in the last 72 hours. Thyroid Function Tests: No results for input(s): TSH, T4TOTAL, FREET4, T3FREE, THYROIDAB in the last 72 hours. Anemia Panel: No results for input(s): VITAMINB12, FOLATE, FERRITIN, TIBC, IRON, RETICCTPCT in the last 72 hours. Urine analysis:    Component Value Date/Time   COLORURINE YELLOW 06/11/2015 0515   APPEARANCEUR CLOUDY* 06/11/2015 0515    LABSPEC 1.023 06/11/2015 0515   LABSPEC 1.025 05/01/2010 1454   PHURINE 5.0 06/11/2015 0515   PHURINE 5.0 05/01/2010 1454   GLUCOSEU NEGATIVE 06/11/2015 0515   HGBUR LARGE* 06/11/2015 0515   HGBUR Negative 05/01/2010 1454   BILIRUBINUR NEGATIVE 06/11/2015 0515   BILIRUBINUR Negative 05/01/2010 1454   KETONESUR NEGATIVE 06/11/2015 0515   KETONESUR Negative 05/01/2010 1454   PROTEINUR NEGATIVE 06/11/2015 0515   PROTEINUR Negative 05/01/2010 1454   UROBILINOGEN 0.2 06/07/2014 2018   NITRITE POSITIVE* 06/11/2015 0515   NITRITE Negative 05/01/2010 1454   LEUKOCYTESUR MODERATE* 06/11/2015 0515   LEUKOCYTESUR Negative 05/01/2010 1454   Sepsis Labs: @LABRCNTIP (procalcitonin:4,lacticidven:4)  ) Recent Results (from the past 240 hour(s))  Blood Culture (routine x 2)     Status: Abnormal   Collection Time: 06/11/15  1:45 AM  Result Value Ref Range Status   Specimen Description BLOOD LEFT FOREARM  Final   Special Requests BOTTLES DRAWN AEROBIC AND ANAEROBIC 5CC  Final   Culture  Setup Time   Final    GRAM NEGATIVE RODS IN BOTH AEROBIC AND ANAEROBIC BOTTLES CRITICAL RESULT CALLED TO, READ BACK BY AND VERIFIED WITH: A MEYER PHARMD [id number] 06/11/15 A BROWNING    Culture (A)  Final    ESCHERICHIA COLI SUSCEPTIBILITIES PERFORMED ON PREVIOUS CULTURE WITHIN THE LAST 5 DAYS.    Report Status 06/13/2015 FINAL  Final  Blood Culture (routine x 2)     Status: Abnormal   Collection Time: 06/11/15  1:50 AM  Result Value Ref Range Status   Specimen Description BLOOD RIGHT FOREARM  Final   Special Requests BOTTLES DRAWN AEROBIC AND ANAEROBIC 5CC   Final   Culture  Setup Time   Final    GRAM NEGATIVE RODS IN BOTH AEROBIC AND ANAEROBIC BOTTLES Organism ID to follow CRITICAL RESULT CALLED TO, READ BACK BY AND VERIFIED WITH: A MEYER PHARMD [id number] 06/11/15 A BROWNING    Culture ESCHERICHIA COLI (A)  Final   Report Status 06/13/2015 FINAL  Final   Organism ID, Bacteria ESCHERICHIA COLI  Final       Susceptibility   Escherichia coli - MIC*    AMPICILLIN <=2 SENSITIVE Sensitive     CEFAZOLIN <=4 SENSITIVE Sensitive     CEFEPIME <=1 SENSITIVE Sensitive     CEFTAZIDIME <=1 SENSITIVE Sensitive     CEFTRIAXONE <=1 SENSITIVE Sensitive     CIPROFLOXACIN <=0.25 SENSITIVE Sensitive     GENTAMICIN <=1 SENSITIVE Sensitive     IMIPENEM <=0.25 SENSITIVE Sensitive     TRIMETH/SULFA <=20 SENSITIVE Sensitive     AMPICILLIN/SULBACTAM <=2  SENSITIVE Sensitive     PIP/TAZO <=4 SENSITIVE Sensitive     * ESCHERICHIA COLI  Blood Culture ID Panel (Reflexed)     Status: Abnormal   Collection Time: 06/11/15  1:50 AM  Result Value Ref Range Status   Enterococcus species NOT DETECTED NOT DETECTED Final   Vancomycin resistance NOT DETECTED NOT DETECTED Final   Listeria monocytogenes NOT DETECTED NOT DETECTED Final   Staphylococcus species NOT DETECTED NOT DETECTED Final   Staphylococcus aureus NOT DETECTED NOT DETECTED Final   Methicillin resistance NOT DETECTED NOT DETECTED Final   Streptococcus species NOT DETECTED NOT DETECTED Final   Streptococcus agalactiae NOT DETECTED NOT DETECTED Final   Streptococcus pneumoniae NOT DETECTED NOT DETECTED Final   Streptococcus pyogenes NOT DETECTED NOT DETECTED Final   Acinetobacter baumannii NOT DETECTED NOT DETECTED Final   Enterobacteriaceae species NOT DETECTED NOT DETECTED Final   Enterobacter cloacae complex NOT DETECTED NOT DETECTED Final   Escherichia coli DETECTED (A) NOT DETECTED Final    Comment: CRITICAL RESULT CALLED TO, READ BACK BY AND VERIFIED WITH: A MEYER PHARMD 1737 06/11/15 A BROWNING    Klebsiella oxytoca NOT DETECTED NOT DETECTED Final   Klebsiella pneumoniae NOT DETECTED NOT DETECTED Final   Proteus species NOT DETECTED NOT DETECTED Final   Serratia marcescens NOT DETECTED NOT DETECTED Final   Carbapenem resistance NOT DETECTED NOT DETECTED Final   Haemophilus influenzae NOT DETECTED NOT DETECTED Final   Neisseria meningitidis NOT  DETECTED NOT DETECTED Final   Pseudomonas aeruginosa NOT DETECTED NOT DETECTED Final   Candida albicans NOT DETECTED NOT DETECTED Final   Candida glabrata NOT DETECTED NOT DETECTED Final   Candida krusei NOT DETECTED NOT DETECTED Final   Candida parapsilosis NOT DETECTED NOT DETECTED Final   Candida tropicalis NOT DETECTED NOT DETECTED Final  Urine culture     Status: Abnormal   Collection Time: 06/11/15  5:16 AM  Result Value Ref Range Status   Specimen Description URINE, CATHETERIZED  Final   Special Requests NONE  Final   Culture 70,000 COLONIES/mL ESCHERICHIA COLI (A)  Final   Report Status 06/13/2015 FINAL  Final   Organism ID, Bacteria ESCHERICHIA COLI (A)  Final      Susceptibility   Escherichia coli - MIC*    AMPICILLIN <=2 SENSITIVE Sensitive     CEFAZOLIN <=4 SENSITIVE Sensitive     CEFTRIAXONE <=1 SENSITIVE Sensitive     CIPROFLOXACIN <=0.25 SENSITIVE Sensitive     GENTAMICIN <=1 SENSITIVE Sensitive     IMIPENEM <=0.25 SENSITIVE Sensitive     NITROFURANTOIN <=16 SENSITIVE Sensitive     TRIMETH/SULFA <=20 SENSITIVE Sensitive     AMPICILLIN/SULBACTAM <=2 SENSITIVE Sensitive     PIP/TAZO <=4 SENSITIVE Sensitive     * 70,000 COLONIES/mL ESCHERICHIA COLI  MRSA PCR Screening     Status: None   Collection Time: 06/11/15  7:46 AM  Result Value Ref Range Status   MRSA by PCR NEGATIVE NEGATIVE Final    Comment:        The GeneXpert MRSA Assay (FDA approved for NASAL specimens only), is one component of a comprehensive MRSA colonization surveillance program. It is not intended to diagnose MRSA infection nor to guide or monitor treatment for MRSA infections.   Culture, sputum-assessment     Status: None   Collection Time: 06/12/15  8:20 AM  Result Value Ref Range Status   Specimen Description SPUTUM  Final   Special Requests NONE  Final   Sputum evaluation  Final    THIS SPECIMEN IS ACCEPTABLE. RESPIRATORY CULTURE REPORT TO FOLLOW.   Report Status 06/12/2015 FINAL   Final  Culture, respiratory (NON-Expectorated)     Status: None (Preliminary result)   Collection Time: 06/12/15  8:20 AM  Result Value Ref Range Status   Specimen Description SPUTUM  Final   Special Requests NONE  Final   Gram Stain   Final    MODERATE WBC PRESENT,BOTH PMN AND MONONUCLEAR RARE SQUAMOUS EPITHELIAL CELLS PRESENT NO ORGANISMS SEEN THIS SPECIMEN IS ACCEPTABLE FOR SPUTUM CULTURE Performed at [facility]    Culture PENDING  Incomplete   Report Status PENDING  Incomplete         Radiology Studies: Dg Swallowing Func-speech Pathology  06/12/2015  Objective Swallowing Evaluation: Type of Study: MBS-Modified Barium Swallow Study Patient Details Name: Greig Kroth, MD MRN: [id number] Date of Birth: 06/04/1925 Today's Date: 06/12/2015 Time: SLP Start Time (ACUTE ONLY): 1030-SLP Stop Time (ACUTE ONLY): 1103 SLP Time Calculation (min) (ACUTE ONLY): 33 min Past Medical History: Past Medical History Diagnosis Date . CAD (coronary artery disease)  . Ischemic cardiomyopathy  . CHF (congestive heart failure) ([place])  . Dyslipidemia  . Diverticulosis of colon (without mention of hemorrhage)  . Diabetes mellitus  . Anemia, unspecified  . Unspecified disorder resulting from impaired renal function  . Chronic airway obstruction, not elsewhere classified    on o2 at night . Prostatic hypertrophy    hx of . Retention of urine, unspecified  . ICD (implantable cardiac defibrillator) in place    st jude . Emphysema  . Hemorrhoids  . MI, old 2010 or 2011 . Macular degeneration  . Cataracts, bilateral  Past Surgical History: Past Surgical History Procedure Laterality Date . Pci     4/11 . Cardiac defibrillator placement  2011   st jude . Pacemaker insertion  2011 . Tonsillectomy  82 yo . Cataract extraction   HPI: 82 y.o. male with a history of COPD on home O2, CAD s/p PCI 3 in 2011, cardiac arrest and VF s/p ICD, chronic systolic CHF last EF [id number] who presented with dyspnea, fever, and yellow  sputum. Per pt and family report, coughing typically begins after PO intake. Barium swallow in February 2014 shows severe dysmotility and concern for stricture in the distal esophagus. Subjective: pt alert and anxious about eating/drinking Assessment / Plan / Recommendation CHL IP CLINICAL IMPRESSIONS 06/12/2015 Therapy Diagnosis WFL;Suspected primary esophageal dysphagia Clinical Impression Pt's oropharyngeal swallow is WFL, but he does have incomplete clearance of barium through his esophagus (MD not present to confirm). He has coughing spells as testing continued, but coughing was not in the presence of airway penetration or aspiration. Question if this could be related to his esophageal component given the correlation to PO intake. SLP provided handout and education about esophageal precautions and recommendation to start with a full liquid diet. Pt may benefit from GI consult to determine if any intervention is warranted as pt does remain at risk for post-prandial aspiration. Will continue to follow. Impact on safety and function Moderate aspiration risk   CHL IP TREATMENT RECOMMENDATION 06/12/2015 Treatment Recommendations Therapy as outlined in treatment plan below   Prognosis 06/12/2015 Prognosis for Safe Diet Advancement Fair Barriers to Reach Goals Other (Comment) Barriers/Prognosis Comment -- CHL IP DIET RECOMMENDATION 06/12/2015 SLP Diet Recommendations Thin liquid Liquid Administration via Cup Medication Administration Crushed with puree Compensations Slow rate;Small sips/bites;Follow solids with liquid Postural Changes Remain semi-upright after after feeds/meals (Comment);Seated upright at 90 degrees  CHL IP OTHER RECOMMENDATIONS 06/12/2015 Recommended Consults Consider GI evaluation Oral Care Recommendations Oral care BID Other Recommendations --   CHL IP FOLLOW UP RECOMMENDATIONS 06/12/2015 Follow up Recommendations (No Data)   CHL IP FREQUENCY AND DURATION 06/12/2015 Speech Therapy Frequency (ACUTE ONLY)  min 2x/week Treatment Duration 2 weeks      CHL IP ORAL PHASE 06/12/2015 Oral Phase WFL Oral - Pudding Teaspoon -- Oral - Pudding Cup -- Oral - Honey Teaspoon -- Oral - Honey Cup -- Oral - Nectar Teaspoon -- Oral - Nectar Cup -- Oral - Nectar Straw -- Oral - Thin Teaspoon -- Oral - Thin Cup -- Oral - Thin Straw -- Oral - Puree -- Oral - Mech Soft -- Oral - Regular -- Oral - Multi-Consistency -- Oral - Pill -- Oral Phase - Comment --  CHL IP PHARYNGEAL PHASE 06/12/2015 Pharyngeal Phase WFL Pharyngeal- Pudding Teaspoon -- Pharyngeal -- Pharyngeal- Pudding Cup -- Pharyngeal -- Pharyngeal- Honey Teaspoon -- Pharyngeal -- Pharyngeal- Honey Cup -- Pharyngeal -- Pharyngeal- Nectar Teaspoon -- Pharyngeal -- Pharyngeal- Nectar Cup -- Pharyngeal -- Pharyngeal- Nectar Straw -- Pharyngeal -- Pharyngeal- Thin Teaspoon -- Pharyngeal -- Pharyngeal- Thin Cup -- Pharyngeal -- Pharyngeal- Thin Straw -- Pharyngeal -- Pharyngeal- Puree -- Pharyngeal -- Pharyngeal- Mechanical Soft -- Pharyngeal -- Pharyngeal- Regular -- Pharyngeal -- Pharyngeal- Multi-consistency -- Pharyngeal -- Pharyngeal- Pill -- Pharyngeal -- Pharyngeal Comment --  CHL IP CERVICAL ESOPHAGEAL PHASE 06/12/2015 Cervical Esophageal Phase WFL Pudding Teaspoon -- Pudding Cup -- Honey Teaspoon -- Honey Cup -- Nectar Teaspoon -- Nectar Cup -- Nectar Straw -- Thin Teaspoon -- Thin Cup -- Thin Straw -- Puree -- Mechanical Soft -- Regular -- Multi-consistency -- Pill -- Cervical Esophageal Comment -- No flowsheet data found. Germain Osgood, M.A. CCC-SLP [phone] Germain Osgood 06/12/2015, 12:10 PM                   Scheduled Meds: . aspirin EC  81 mg Oral QHS  . budesonide  0.5 mg Nebulization BID  . cefTRIAXone (ROCEPHIN)  IV  2 g Intravenous Q24H  . clopidogrel  75 mg Oral Daily  . enoxaparin (LOVENOX) injection  40 mg Subcutaneous Q24H  . guaiFENesin  600 mg Oral BID  . ipratropium-albuterol  3 mL Nebulization QID  . methylPREDNISolone (SOLU-MEDROL)  injection  60 mg Intravenous Q12H  . pantoprazole (PROTONIX) IV  40 mg Intravenous Q12H  . polyethylene glycol  17 g Oral Daily  . rosuvastatin  5 mg Oral Daily  . senna-docusate  1 tablet Oral BID  . sodium chloride flush  3 mL Intravenous Q12H  . vitamin B-12  5,000 mcg Oral Daily   Continuous Infusions: . sodium chloride 10 mL/hr at 06/12/15 1137     LOS: 2 days    Time spent: 40 minutes    Dan Scearce, Geraldo Docker, MD [facility] Pager [phone]   If 7PM-7AM, please contact night-coverage [email or web address] Password [email or web address] 06/13/2015, 7:01 PM

## 2015-06-13 NOTE — Care Management Note (Addendum)
Case Management Note  Patient Details  Name: Bernhard Speak, MD MRN: [id number] Date of Birth: 1925-04-08  Subjective/Objective:        NCM spoke with patient and his daughter, patient gave permission to speak with daughter also.  Patient lives with his two nieces, that help him at home if needed, his daughters also check on him,  he also has an  Aide that comes in from 2 to 71 with [employer] Mon - Fri.  He has home oxygen with [employer]  (2.5 liters).  Per per pt eval rec hhpt, NCM left agency list with patient and daughter to choose agency, they will let NCM know tomorrow who they chose.    5/18- NCM checked with patient to see what agency he chose , he states he has not chosen one yet, NCM will check back with him later.   5/18- NCM checked back with patient to see what agency he wanted , he said he still was not sure.             Action/Plan:   Expected Discharge Date:                  Expected Discharge Plan:  [place]  In-House Referral:     Discharge planning Services  CM Consult  Post Acute Care Choice:    Choice offered to:     DME Arranged:    DME Agency:     HH Arranged:    [place] Agency:     Status of Service:  In process, will continue to follow  Medicare Important Message Given:    Date Medicare IM Given:    Medicare IM give by:    Date Additional Medicare IM Given:    Additional Medicare Important Message give by:     If discussed at [place] of Stay Meetings, dates discussed:    Additional Comments:  Zenon Mayo, RN 06/13/2015, 4:02 PM

## 2015-06-13 NOTE — Evaluation (Signed)
Physical Therapy Evaluation Patient Details Name: [name] Ostrowsky, MD MRN: [id number] DOB: Dec 04, 1925 Today's Date: 06/13/2015   History of Present Illness  82 y.o. male with a history of COPD on home O2, CAD s/p PCI 3 in 2011, cardiac arrest and VF s/p ICD, chronic systolic CHF last EF [id number] who presented with dyspnea, fever, and yellow sputum.  Clinical Impression  Patient presents with decreased independence with mobility due to deficits listed in PT problem list below.  He will benefit from skilled PT in the acute setting to allow return home with family support and follow up HHPT.    Follow Up Recommendations Home health PT    Equipment Recommendations  None recommended by PT    Recommendations for Other Services       Precautions / Restrictions Precautions Precautions: Fall Precaution Comments: O2 dependent      Mobility  Bed Mobility Overal bed mobility: Modified Independent             General bed mobility comments: with use of rail  Transfers Overall transfer level: Needs assistance   Transfers: Sit to/from Stand;Stand Pivot Transfers Sit to Stand: Min assist Stand pivot transfers: Min assist       General transfer comment: assist for line management and for safety with stand pivot transfer   Ambulation/Gait                Stairs            Wheelchair Mobility    Modified Rankin (Stroke Patients Only)       Balance Overall balance assessment: Needs assistance   Sitting balance-Leahy Scale: Good     Standing balance support: Single extremity supported Standing balance-Leahy Scale: Poor Standing balance comment: during pivot to chair needs hands on support, stays flexed and reaches his hands to arms on chair                             Pertinent Vitals/Pain Pain Assessment: No/denies pain    Home Living Family/patient expects to be discharged to:: Private residence Living Arrangements: Children Available Help at  Discharge: Family;Personal care attendant;Available 24 hours/day Type of Home: House Home Access: Level entry     Home Layout: Multi-level Home Equipment: Wheelchair - power;Wheelchair - manual;Grab bars - toilet;Shower seat      Prior Function Level of Independence: Independent with assistive device(s)         Comments: has aide that comes every day; patient showers and dresses himself with increased time; transfers to w/c and manevers in w/c on his own     Hand Dominance        Extremity/Trunk Assessment   Upper Extremity Assessment: Generalized weakness           Lower Extremity Assessment: Generalized weakness      Cervical / Trunk Assessment: Kyphotic  Communication   Communication: HOH  Cognition Arousal/Alertness: Awake/alert Behavior During Therapy: Anxious Overall Cognitive Status: Within Functional Limits for tasks assessed                      General Comments General comments (skin integrity, edema, etc.): Son in room and providing history due to pt [facility] and can get into coughing with anxiety easily    Exercises        Assessment/Plan    PT Assessment Patient needs continued PT services  PT Diagnosis Generalized weakness   PT Problem List Decreased mobility;Decreased  activity tolerance;Decreased balance;Cardiopulmonary status limiting activity  PT Treatment Interventions DME instruction;Functional mobility training;Patient/family education;Therapeutic activities;Therapeutic exercise;Wheelchair mobility training   PT Goals (Current goals can be found in the Care Plan section) Acute Rehab PT Goals Patient Stated Goal: To return home PT Goal Formulation: With patient/family Time For Goal Achievement: 06/20/15 Potential to Achieve Goals: Good    Frequency Min 3X/week   Barriers to discharge        Co-evaluation               End of Session Equipment Utilized During Treatment: Oxygen Activity Tolerance: Patient limited by  fatigue Patient left: with call bell/phone within reach;in chair;with family/visitor present           Time: [id number] PT Time Calculation (min) (ACUTE ONLY): 31 min   Charges:   PT Evaluation $PT Eval Moderate Complexity: 1 Procedure PT Treatments $Therapeutic Activity: 8-22 mins   PT G CodesReginia Naas 06/25/2015, 1:01 PM Magda Kiel, [place] 2015/06/25

## 2015-06-14 ENCOUNTER — Inpatient Hospital Stay (HOSPITAL_COMMUNITY): Payer: Medicare Other

## 2015-06-14 ENCOUNTER — Encounter: Payer: Medicare Other | Admitting: *Deleted

## 2015-06-14 DIAGNOSIS — J189 Pneumonia, unspecified organism: Secondary | ICD-10-CM

## 2015-06-14 DIAGNOSIS — E875 Hyperkalemia: Secondary | ICD-10-CM

## 2015-06-14 LAB — LACTIC ACID, PLASMA: Lactic Acid, Venous: 1.1 mmol/L (ref 0.5–2.0)

## 2015-06-14 LAB — CBC WITH DIFFERENTIAL/PLATELET
BASOS ABS: 0 10*3/uL (ref 0.0–0.1)
BASOS PCT: 0 %
Eosinophils Absolute: 0 10*3/uL (ref 0.0–0.7)
Eosinophils Relative: 0 %
HCT: 25.5 % — ABNORMAL LOW (ref 39.0–52.0)
HEMOGLOBIN: 8 g/dL — AB (ref 13.0–17.0)
LYMPHS PCT: 4 %
Lymphs Abs: 0.2 10*3/uL — ABNORMAL LOW (ref 0.7–4.0)
MCH: 21.2 pg — AB (ref 26.0–34.0)
MCHC: 31.4 g/dL (ref 30.0–36.0)
MCV: 67.5 fL — AB (ref 78.0–100.0)
MONOS PCT: 3 %
Monocytes Absolute: 0.1 10*3/uL (ref 0.1–1.0)
NEUTROS ABS: 3.6 10*3/uL (ref 1.7–7.7)
NEUTROS PCT: 93 %
PLATELETS: 82 10*3/uL — AB (ref 150–400)
RBC: 3.78 MIL/uL — ABNORMAL LOW (ref 4.22–5.81)
RDW: 18.2 % — ABNORMAL HIGH (ref 11.5–15.5)
WBC: 3.9 10*3/uL — ABNORMAL LOW (ref 4.0–10.5)

## 2015-06-14 LAB — COMPREHENSIVE METABOLIC PANEL
ALK PHOS: 35 U/L — AB (ref 38–126)
ALT: 43 U/L (ref 17–63)
AST: 23 U/L (ref 15–41)
Albumin: 2.6 g/dL — ABNORMAL LOW (ref 3.5–5.0)
Anion gap: 10 (ref 5–15)
BUN: 47 mg/dL — AB (ref 6–20)
CALCIUM: 8.4 mg/dL — AB (ref 8.9–10.3)
CO2: 25 mmol/L (ref 22–32)
CREATININE: 1.42 mg/dL — AB (ref 0.61–1.24)
Chloride: 108 mmol/L (ref 101–111)
GFR calc Af Amer: 49 mL/min — ABNORMAL LOW (ref >60)
GFR calc non Af Amer: 43 mL/min — ABNORMAL LOW (ref >60)
Glucose, Bld: 179 mg/dL — ABNORMAL HIGH (ref 65–99)
Potassium: 5.3 mmol/L — ABNORMAL HIGH (ref 3.5–5.1)
SODIUM: 143 mmol/L (ref 135–145)
Total Bilirubin: 0.3 mg/dL (ref 0.3–1.2)
Total Protein: 5.5 g/dL — ABNORMAL LOW (ref 6.5–8.1)

## 2015-06-14 LAB — MAGNESIUM: Magnesium: 2.6 mg/dL — ABNORMAL HIGH (ref 1.7–2.4)

## 2015-06-14 MED ORDER — FAMOTIDINE 20 MG PO TABS
20.0000 mg | ORAL_TABLET | Freq: Two times a day (BID) | ORAL | Status: DC | PRN
  Administered 2015-06-16: 20 mg via ORAL
  Filled 2015-06-14 (×2): qty 1

## 2015-06-14 MED ORDER — SODIUM POLYSTYRENE SULFONATE 15 GM/60ML PO SUSP
45.0000 g | Freq: Once | ORAL | Status: AC
  Administered 2015-06-14: 45 g via ORAL
  Filled 2015-06-14: qty 180

## 2015-06-14 MED ORDER — PANTOPRAZOLE SODIUM 40 MG PO TBEC
40.0000 mg | DELAYED_RELEASE_TABLET | Freq: Two times a day (BID) | ORAL | Status: DC
  Administered 2015-06-14 – 2015-06-20 (×12): 40 mg via ORAL
  Filled 2015-06-14 (×12): qty 1

## 2015-06-14 MED ORDER — PANTOPRAZOLE SODIUM 40 MG PO TBEC
40.0000 mg | DELAYED_RELEASE_TABLET | Freq: Every day | ORAL | Status: DC
  Administered 2015-06-14: 40 mg via ORAL
  Filled 2015-06-14: qty 1

## 2015-06-14 MED ORDER — METHYLPREDNISOLONE SODIUM SUCC 125 MG IJ SOLR
60.0000 mg | Freq: Every day | INTRAMUSCULAR | Status: DC
  Administered 2015-06-15 – 2015-06-18 (×4): 60 mg via INTRAVENOUS
  Filled 2015-06-14 (×4): qty 2

## 2015-06-14 NOTE — Evaluation (Signed)
Occupational Therapy Evaluation Patient Details Name: [name] Cavey, MD MRN: [id number] DOB: 01-Jun-1925 Today's Date: 06/14/2015    History of Present Illness 82 y.o. male with a history of COPD on home O2, CAD s/p PCI 3 in 2011, cardiac arrest and VF s/p ICD, chronic systolic CHF last EF [id number] who presented with dyspnea, fever, and yellow sputum.   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  At baseline, he is mostly mod I for adls and prepares his own breakfast from w/c.  He lives with 2 nieces and has an aide daily who assist as needed.  He will benefit from continued OT to increase safety and independence with adls.  Goals in acute are forr min guard to min A levels for most adls.    Follow Up Recommendations  Home health OT;Supervision/Assistance - 24 hour    Equipment Recommendations  None recommended by OT    Recommendations for Other Services       Precautions / Restrictions Precautions Precautions: Fall Restrictions Weight Bearing Restrictions: No      Mobility Bed Mobility Overal bed mobility: Modified Independent             General bed mobility comments: with use of rail  Transfers     Transfers: Sit to/from Stand Sit to Stand: Min assist Stand pivot transfers: Min assist       General transfer comment: assist to steady and manage lines; pt with decreased balance    Balance                                            ADL Overall ADL's : Needs assistance/impaired Eating/Feeding: Supervision/ safety;Set up;Sitting   Grooming: Set up;Sitting (occasional min A to open containers)   Upper Body Bathing: Minimal assitance;Sitting (lines)   Lower Body Bathing: Moderate assistance;Sit to/from stand   Upper Body Dressing : Minimal assistance;Sitting (lines)   Lower Body Dressing: Maximal assistance;Sit to/from stand   Toilet Transfer: Minimal assistance;RW;Stand-pivot (to recliner)   Toileting- Clothing  Manipulation and Hygiene: Minimal assistance;Bed level         General ADL Comments: educated pt on sock aide and he tried this. Also educated on button hook, which I did not have to show him.  Pt's meal came and he is full supervision:  Pt did well pacing himself and alternating sips and bites, however, he has coughing at baseline and needed cues to clear throat prior to another sip or bite     Vision     Perception     Praxis      Pertinent Vitals/Pain Pain Assessment: No/denies pain     Hand Dominance Right   Extremity/Trunk Assessment Upper Extremity Assessment Upper Extremity Assessment: Generalized weakness (numbness in fingers bil)           Communication Communication Communication: HOH   Cognition Arousal/Alertness: Awake/alert Behavior During Therapy: WFL for tasks assessed/performed Overall Cognitive Status: Within Functional Limits for tasks assessed                     General Comments       Exercises       Shoulder Instructions      Home Living Family/patient expects to be discharged to:: Private residence Living Arrangements: Children Available Help at Discharge: Family;Personal care attendant;Available 24 hours/day Type of Home: House  Bathroom Shower/Tub: Risk analyst characteristics: Architectural technologist: Radio producer: Wheelchair - power;Wheelchair - manual;Grab bars - toilet;Shower seat   Additional Comments: .      Prior Functioning/Environment          Comments: assist with socks; can do other adls except has difficulty with buttons    OT Diagnosis: Generalized weakness   OT Problem List: Decreased strength;Decreased activity tolerance;Impaired balance (sitting and/or standing);Decreased knowledge of use of DME or AE;Pain;Cardiopulmonary status limiting activity;Decreased coordination   OT Treatment/Interventions: Self-care/ADL training;DME and/or AE instruction;Energy  conservation;Patient/family education;Balance training;Therapeutic activities    OT Goals(Current goals can be found in the care plan section) Acute Rehab OT Goals Patient Stated Goal: To return home OT Goal Formulation: With patient Time For Goal Achievement: 06/28/15 Potential to Achieve Goals: Good ADL Goals Pt Will Perform Lower Body Bathing: with min assist;sit to/from stand Pt Will Perform Lower Body Dressing: with min assist;with adaptive equipment;sit to/from stand Pt Will Transfer to Toilet: with min guard assist;ambulating;bedside commode Additional ADL Goal #1: pt will initiate rest breaks without cues during adl for energy conservation Additional ADL Goal #2: pt eat at mod I level following strategies  OT Frequency: Min 2X/week   Barriers to D/C:            Co-evaluation              End of Session    Activity Tolerance: Patient tolerated treatment well Patient left: in chair;with call bell/phone within reach;with family/visitor present   Time: [id number] OT Time Calculation (min): 37 min Charges:  OT General Charges $OT Visit: 1 Procedure OT Evaluation $OT Eval Moderate Complexity: 1 Procedure OT Treatments $Self Care/Home Management : 8-22 mins G-Codes:    Kandice Schmelter 07/11/2015, 10:28 AM  Lesle Chris, OTR/L [phone] 11-Jul-2015

## 2015-06-14 NOTE — Care Management Important Message (Signed)
Important Message  Patient Details  Name: Narain Waugaman, MD MRN: [id number] Date of Birth: March 07, 1925   Medicare Important Message Given:  Yes    Zenon Mayo, RN 06/14/2015, 11:38 AMImportant Message  Patient Details  Name: Jacion Beggs, MD MRN: [id number] Date of Birth: 04-22-1925   Medicare Important Message Given:  Yes    Zenon Mayo, RN 06/14/2015, 11:38 AM

## 2015-06-14 NOTE — Progress Notes (Signed)
PROGRESS NOTE    [name] Azure, MD  VT:[id number] DOB: 04-18-1925 DOA: 06/11/2015 PCP: [name], MD   Brief Narrative:  82 y.o. WM ([place]) PMHx COPD on home O2, CAD s/p PCI 3 in 2011, cardiac arrest and VF s/p ICD, chronic systolic CHF last EF [id number]   Presented with dyspnea, fever, and yellow sputum. The patient started to develop cough productive of yellow sputum about a week prior accompanied by dyspnea. He saw his Cardiologist who noted fluid overload, and prescribed Lasix for three days. There was no fever and a CXR was negative at that time. Symptoms persisted and he went to his PCP four days prior who prescribed prednisone, Abx, and increased albuterol. The night of his admission the patient woke with chills, sweats, worsening cough, and he couldn't breathe.   In the ED the pt was in respiratory distress, and febrile to 104.33F rectally. CXR without opacity. Lactate 2.95.   Assessment & Plan:   Principal Problem:   Sepsis ([place]) Active Problems:   Anemia of chronic disease   Chronic kidney disease, stage 3   Automatic implantable cardioverter-defibrillator in situ   Chronic systolic CHF (congestive heart failure) (HCC)   Acute respiratory failure (HCC)   COPD exacerbation (HCC)   Severe protein-calorie malnutrition (HCC)   Sepsis due to Escherichia coli (E. coli) (HCC)   Bacteremia, escherichia coli   Pyelonephritis due to Escherichia coli   Dysphagia   Cardiomyopathy, ischemic   CKD (chronic kidney disease), stage III   Sepsis due to pneumonia (HCC)   Hyperkalemia   Sepsis secondary w/positive E coli bacteremia due to positive E coli Pyelonephritis -Lactic acidosis resolved -DuoNeb QID -Pulmicort BID -Solu-Medrol 60 mg daily -Flutter valve -Start physiotherapy vest BID -Will require 2 weeks antibiotics -Repeat blood culture 5/19  Acute bronchitis - PNA less likely - Acute on chronic hypoxic respiratory failure  -Flu negative  - sats now 100% but  requiring higher level O2 support  -See sepsis  COPD exacerbation -On home 2.5 L O2 at night  -Continue wheezing see sepsis  Primary Esophageal Dysphagia -After patient's respiratory status improved will consult GI prior to discharge possible stricture?  CAD s/p PCI2011 -Asymptomatic   Chronic systolic CHF- Ischemic Cardiomyopathy - AICD EF 55-60% w/ grade 1 DD via TTE 06/06/15 -Daily weight Filed Weights   06/12/15 0422 06/13/15 0409 06/14/15 0449  Weight: 85.2 kg (187 lb 13.3 oz) 84.9 kg (187 lb 2.7 oz) 84.142 kg (185 lb 8 oz)  -Strict in and out since admission +2.1 L -Transfuse for hemoglobin<8   Recent Labs Lab 06/11/15 0145 06/11/15 0741 06/12/15 0432 06/13/15 0501 06/14/15 0510  HGB 9.1* 7.7* 7.5* 8.0* 8.0*   Mild AoS -Per TTE 06/06/15 -See CHF  CKD stage III  Lab Results  Component Value Date   CREATININE 1.42* 06/14/2015   CREATININE 1.60* 06/13/2015   CREATININE 1.57* 06/12/2015   Chronic microcytic anemia(baseline Hgb appears to be ~9 ) -B thalassemia and chronic disease   Recent Labs Lab 06/11/15 0145 06/11/15 0741 06/12/15 0432 06/13/15 0501  HGB 9.1* 7.7* 7.5* 8.0*   Hyperkalemia -Kayexalate 45 gm 1  GERD -Pepcid 20 mg PRN  -Increase Protonix 40 mg BID      Rimantadine DVT prophylaxis:Lovenox Code Status: FULL - pt clear he would desire "short term" vent support but not "long term"  Family Communication: Daughter at bedside Disposition Plan: Resolution sepsis   Consultants:  NA  Procedures/Significant Events:  5/10 echocardiogram;Left ventricle: mild LVH. -LVEF= 55% to 60%. -(  grade 1 diastolic dysfunction).  5/16 barium swallow; primary esophageal dysphagia. Incomplete clearance barium through his esophagus. -Moderate aspiration risk  Cultures 5/15 blood right forearm 2 positive Escherichia coli 5/15 urine positive Escherichia coli 5/15 influenza panel negative  5/16 sputum pending   Antimicrobials: Zosyn 5/15  1 dose Vanc 5/15 1 dose Azithromycin 5/151 dose Rocephin 5/15 >>   Devices    LINES / TUBES:      Continuous Infusions: . sodium chloride Stopped (06/13/15 2300)     Subjective: 5/18 A/O 4, states normally on 2.5 L O2 at home at night. Patient was able to eat today but has some increased GERD.    Objective: Filed Vitals:   06/14/15 1206 06/14/15 1209 06/14/15 1444 06/14/15 1505  BP:  142/75 143/98   Pulse:  62 66   Temp:  97.6 F (36.4 C) 97.4 F (36.3 C)   TempSrc:  Axillary Oral   Resp:  18 23   Height:      Weight:      SpO2: 98% 99% 100% 98%    Intake/Output Summary (Last 24 hours) at 06/14/15 1854 Last data filed at 06/14/15 1448  Gross per 24 hour  Intake    330 ml  Output   2100 ml  Net  -1770 ml   Filed Weights   06/12/15 0422 06/13/15 0409 06/14/15 0449  Weight: 85.2 kg (187 lb 13.3 oz) 84.9 kg (187 lb 2.7 oz) 84.142 kg (185 lb 8 oz)    Examination:  General: A/O 4, positive acute on chronic respiratory distress but improved. Eyes: negative scleral hemorrhage, negative anisocoria, negative icterus ENT: Negative Runny nose, negative gingival bleeding, Neck:  Negative scars, masses, torticollis, lymphadenopathy, JVD Lungs:  positive rhonchi (improved from 5/17), with positive productive cough (yellow/blood-tinged ), positive wheezes, negative crackles  Cardiovascular: Regular rate and rhythm without murmur gallop or rub normal S1 and S2 Abdomen: negative abdominal pain, nondistended, positive soft, bowel sounds, no rebound, no ascites, no appreciable mass Extremities: No significant cyanosis, clubbing, or edema bilateral lower extremities Skin: Negative rashes, lesions, ulcers Psychiatric:  Negative depression, negative anxiety, negative fatigue, negative mania Central nervous system:  Cranial nerves II through XII intact, tongue/uvula midline, all extremities muscle strength 5/5, sensation intact throughout, finger nose finger bilateral  within normal limits, quick finger touch bilateral within normal limits,  negative dysarthria, negative expressive aphasia, negative receptive aphasia.  .     Data Reviewed: Care during the described time interval was provided by me .  I have reviewed this patient's available data, including medical history, events of note, physical examination, and all test results as part of my evaluation. I have personally reviewed and interpreted all radiology studies.  CBC:  Recent Labs Lab 06/11/15 0145 06/11/15 0741 06/12/15 0432 06/13/15 0501 06/14/15 0510  WBC 4.6 16.4* 10.9* 7.2 3.9*  NEUTROABS 4.3  --   --   --  3.6  HGB 9.1* 7.7* 7.5* 8.0* 8.0*  HCT 29.2* 25.0* 23.4* 25.7* 25.5*  MCV 67.9* 68.1* 68.4* 68.2* 67.5*  PLT 98* 76* 76* 90* 82*   Basic Metabolic Panel:  Recent Labs Lab 06/11/15 0145 06/11/15 0741 06/12/15 0432 06/13/15 0501 06/14/15 0510  NA 137  --  142 143 143  K 4.9  --  5.2* 5.0 5.3*  CL 104  --  111 111 108  CO2 22  --  21* 20* 25  GLUCOSE 116*  --  167* 183* 179*  BUN 53*  --  45* 54* 47*  CREATININE 1.82* 1.90* 1.57* 1.60* 1.42*  CALCIUM 8.4*  --  7.7* 8.1* 8.4*  MG  --   --   --   --  2.6*   GFR: Estimated Creatinine Clearance: 38.3 mL/min (by C-G formula based on Cr of 1.42). Liver Function Tests:  Recent Labs Lab 06/11/15 0145 06/12/15 0432 06/13/15 0501 06/14/15 0510  AST 39 31 29 23   ALT 33 53 47 43  ALKPHOS 59 42 42 35*  BILITOT 0.7 0.3 0.5 0.3  PROT 6.9 5.4* 5.6* 5.5*  ALBUMIN 3.5 2.7* 2.8* 2.6*   No results for input(s): LIPASE, AMYLASE in the last 168 hours. No results for input(s): AMMONIA in the last 168 hours. Coagulation Profile: No results for input(s): INR, PROTIME in the last 168 hours. Cardiac Enzymes: No results for input(s): CKTOTAL, CKMB, CKMBINDEX, TROPONINI in the last 168 hours. BNP (last 3 results) No results for input(s): PROBNP in the last 8760 hours. HbA1C: No results for input(s): HGBA1C in the last 72  hours. CBG: No results for input(s): GLUCAP in the last 168 hours. Lipid Profile: No results for input(s): CHOL, HDL, LDLCALC, TRIG, CHOLHDL, LDLDIRECT in the last 72 hours. Thyroid Function Tests: No results for input(s): TSH, T4TOTAL, FREET4, T3FREE, THYROIDAB in the last 72 hours. Anemia Panel: No results for input(s): VITAMINB12, FOLATE, FERRITIN, TIBC, IRON, RETICCTPCT in the last 72 hours. Urine analysis:    Component Value Date/Time   COLORURINE YELLOW 06/11/2015 0515   APPEARANCEUR CLOUDY* 06/11/2015 0515   LABSPEC 1.023 06/11/2015 0515   LABSPEC 1.025 05/01/2010 1454   PHURINE 5.0 06/11/2015 0515   PHURINE 5.0 05/01/2010 1454   GLUCOSEU NEGATIVE 06/11/2015 0515   HGBUR LARGE* 06/11/2015 0515   HGBUR Negative 05/01/2010 1454   BILIRUBINUR NEGATIVE 06/11/2015 0515   BILIRUBINUR Negative 05/01/2010 1454   KETONESUR NEGATIVE 06/11/2015 0515   KETONESUR Negative 05/01/2010 1454   PROTEINUR NEGATIVE 06/11/2015 0515   PROTEINUR Negative 05/01/2010 1454   UROBILINOGEN 0.2 06/07/2014 2018   NITRITE POSITIVE* 06/11/2015 0515   NITRITE Negative 05/01/2010 1454   LEUKOCYTESUR MODERATE* 06/11/2015 0515   LEUKOCYTESUR Negative 05/01/2010 1454   Sepsis Labs: @LABRCNTIP (procalcitonin:4,lacticidven:4)  ) Recent Results (from the past 240 hour(s))  Blood Culture (routine x 2)     Status: Abnormal   Collection Time: 06/11/15  1:45 AM  Result Value Ref Range Status   Specimen Description BLOOD LEFT FOREARM  Final   Special Requests BOTTLES DRAWN AEROBIC AND ANAEROBIC 5CC  Final   Culture  Setup Time   Final    GRAM NEGATIVE RODS IN BOTH AEROBIC AND ANAEROBIC BOTTLES CRITICAL RESULT CALLED TO, READ BACK BY AND VERIFIED WITH: A MEYER PHARMD [id number] 06/11/15 A BROWNING    Culture (A)  Final    ESCHERICHIA COLI SUSCEPTIBILITIES PERFORMED ON PREVIOUS CULTURE WITHIN THE LAST 5 DAYS.    Report Status 06/13/2015 FINAL  Final  Blood Culture (routine x 2)     Status: Abnormal    Collection Time: 06/11/15  1:50 AM  Result Value Ref Range Status   Specimen Description BLOOD RIGHT FOREARM  Final   Special Requests BOTTLES DRAWN AEROBIC AND ANAEROBIC 5CC   Final   Culture  Setup Time   Final    GRAM NEGATIVE RODS IN BOTH AEROBIC AND ANAEROBIC BOTTLES Organism ID to follow CRITICAL RESULT CALLED TO, READ BACK BY AND VERIFIED WITH: A MEYER PHARMD [id number] 06/11/15 A BROWNING    Culture ESCHERICHIA COLI (A)  Final   Report Status 06/13/2015 FINAL  Final   Organism ID, Bacteria ESCHERICHIA COLI  Final      Susceptibility   Escherichia coli - MIC*    AMPICILLIN <=2 SENSITIVE Sensitive     CEFAZOLIN <=4 SENSITIVE Sensitive     CEFEPIME <=1 SENSITIVE Sensitive     CEFTAZIDIME <=1 SENSITIVE Sensitive     CEFTRIAXONE <=1 SENSITIVE Sensitive     CIPROFLOXACIN <=0.25 SENSITIVE Sensitive     GENTAMICIN <=1 SENSITIVE Sensitive     IMIPENEM <=0.25 SENSITIVE Sensitive     TRIMETH/SULFA <=20 SENSITIVE Sensitive     AMPICILLIN/SULBACTAM <=2 SENSITIVE Sensitive     PIP/TAZO <=4 SENSITIVE Sensitive     * ESCHERICHIA COLI  Blood Culture ID Panel (Reflexed)     Status: Abnormal   Collection Time: 06/11/15  1:50 AM  Result Value Ref Range Status   Enterococcus species NOT DETECTED NOT DETECTED Final   Vancomycin resistance NOT DETECTED NOT DETECTED Final   Listeria monocytogenes NOT DETECTED NOT DETECTED Final   Staphylococcus species NOT DETECTED NOT DETECTED Final   Staphylococcus aureus NOT DETECTED NOT DETECTED Final   Methicillin resistance NOT DETECTED NOT DETECTED Final   Streptococcus species NOT DETECTED NOT DETECTED Final   Streptococcus agalactiae NOT DETECTED NOT DETECTED Final   Streptococcus pneumoniae NOT DETECTED NOT DETECTED Final   Streptococcus pyogenes NOT DETECTED NOT DETECTED Final   Acinetobacter baumannii NOT DETECTED NOT DETECTED Final   Enterobacteriaceae species NOT DETECTED NOT DETECTED Final   Enterobacter cloacae complex NOT DETECTED NOT DETECTED  Final   Escherichia coli DETECTED (A) NOT DETECTED Final    Comment: CRITICAL RESULT CALLED TO, READ BACK BY AND VERIFIED WITH: A MEYER PHARMD [id number] 06/11/15 A BROWNING    Klebsiella oxytoca NOT DETECTED NOT DETECTED Final   Klebsiella pneumoniae NOT DETECTED NOT DETECTED Final   Proteus species NOT DETECTED NOT DETECTED Final   Serratia marcescens NOT DETECTED NOT DETECTED Final   Carbapenem resistance NOT DETECTED NOT DETECTED Final   Haemophilus influenzae NOT DETECTED NOT DETECTED Final   Neisseria meningitidis NOT DETECTED NOT DETECTED Final   Pseudomonas aeruginosa NOT DETECTED NOT DETECTED Final   Candida albicans NOT DETECTED NOT DETECTED Final   Candida glabrata NOT DETECTED NOT DETECTED Final   Candida krusei NOT DETECTED NOT DETECTED Final   Candida parapsilosis NOT DETECTED NOT DETECTED Final   Candida tropicalis NOT DETECTED NOT DETECTED Final  Urine culture     Status: Abnormal   Collection Time: 06/11/15  5:16 AM  Result Value Ref Range Status   Specimen Description URINE, CATHETERIZED  Final   Special Requests NONE  Final   Culture 70,000 COLONIES/mL ESCHERICHIA COLI (A)  Final   Report Status 06/13/2015 FINAL  Final   Organism ID, Bacteria ESCHERICHIA COLI (A)  Final      Susceptibility   Escherichia coli - MIC*    AMPICILLIN <=2 SENSITIVE Sensitive     CEFAZOLIN <=4 SENSITIVE Sensitive     CEFTRIAXONE <=1 SENSITIVE Sensitive     CIPROFLOXACIN <=0.25 SENSITIVE Sensitive     GENTAMICIN <=1 SENSITIVE Sensitive     IMIPENEM <=0.25 SENSITIVE Sensitive     NITROFURANTOIN <=16 SENSITIVE Sensitive     TRIMETH/SULFA <=20 SENSITIVE Sensitive     AMPICILLIN/SULBACTAM <=2 SENSITIVE Sensitive     PIP/TAZO <=4 SENSITIVE Sensitive     * 70,000 COLONIES/mL ESCHERICHIA COLI  MRSA PCR Screening     Status: None   Collection Time: 06/11/15  7:46 AM  Result Value Ref Range Status  MRSA by PCR NEGATIVE NEGATIVE Final    Comment:        The GeneXpert MRSA Assay (FDA approved  for NASAL specimens only), is one component of a comprehensive MRSA colonization surveillance program. It is not intended to diagnose MRSA infection nor to guide or monitor treatment for MRSA infections.   Culture, sputum-assessment     Status: None   Collection Time: 06/12/15  8:20 AM  Result Value Ref Range Status   Specimen Description SPUTUM  Final   Special Requests NONE  Final   Sputum evaluation   Final    THIS SPECIMEN IS ACCEPTABLE. RESPIRATORY CULTURE REPORT TO FOLLOW.   Report Status 06/12/2015 FINAL  Final  Culture, respiratory (NON-Expectorated)     Status: None (Preliminary result)   Collection Time: 06/12/15  8:20 AM  Result Value Ref Range Status   Specimen Description SPUTUM  Final   Special Requests NONE  Final   Gram Stain   Final    MODERATE WBC PRESENT,BOTH PMN AND MONONUCLEAR RARE SQUAMOUS EPITHELIAL CELLS PRESENT NO ORGANISMS SEEN THIS SPECIMEN IS ACCEPTABLE FOR SPUTUM CULTURE Performed at [facility]    Culture   Final    NORMAL OROPHARYNGEAL FLORA Performed at [facility]    Report Status PENDING  Incomplete         Radiology Studies: Dg Chest Port 1 View  06/14/2015  CLINICAL DATA:  Pneumonia. EXAM: PORTABLE CHEST 1 VIEW COMPARISON:  06/11/2015. FINDINGS: Cardiac pacer noted lead tips in right atrium right ventricle. Cardiomegaly with normal pulmonary vascularity. Minimal right perihilar right base infiltrate cannot be excluded. No pleural effusion or pneumothorax. IMPRESSION: 1. Cardiac pacer in stable position. Cardiomegaly. No pulmonary venous congestion. 2. Minimal right perihilar and right base infiltrate cannot be excluded . Electronically Signed   By: Marcello Moores  Register   On: 06/14/2015 08:05        Scheduled Meds: . aspirin EC  81 mg Oral QHS  . budesonide  0.5 mg Nebulization BID  . cefTRIAXone (ROCEPHIN)  IV  2 g Intravenous Q24H  . clopidogrel  75 mg Oral Daily  . enoxaparin (LOVENOX) injection  40 mg  Subcutaneous Q24H  . guaiFENesin  600 mg Oral BID  . ipratropium-albuterol  3 mL Nebulization QID  . [START ON 06/15/2015] methylPREDNISolone (SOLU-MEDROL) injection  60 mg Intravenous Daily  . pantoprazole  40 mg Oral BID  . polyethylene glycol  17 g Oral Daily  . rosuvastatin  5 mg Oral Daily  . senna-docusate  1 tablet Oral BID  . sodium chloride flush  3 mL Intravenous Q12H  . vitamin B-12  5,000 mcg Oral Daily   Continuous Infusions: . sodium chloride Stopped (06/13/15 2300)     LOS: 3 days    Time spent: 40 minutes    Starr Urias, Geraldo Docker, MD [facility] Pager [phone]   If 7PM-7AM, please contact night-coverage [email or web address] Password [facility] 06/14/2015, 6:54 PM

## 2015-06-15 ENCOUNTER — Encounter: Payer: Self-pay | Admitting: Cardiology

## 2015-06-15 DIAGNOSIS — D509 Iron deficiency anemia, unspecified: Secondary | ICD-10-CM | POA: Diagnosis present

## 2015-06-15 LAB — CBC WITH DIFFERENTIAL/PLATELET
BASOS PCT: 0 %
Basophils Absolute: 0 10*3/uL (ref 0.0–0.1)
EOS PCT: 0 %
Eosinophils Absolute: 0 10*3/uL (ref 0.0–0.7)
HEMATOCRIT: 25.5 % — AB (ref 39.0–52.0)
Hemoglobin: 7.8 g/dL — ABNORMAL LOW (ref 13.0–17.0)
Lymphocytes Relative: 13 %
Lymphs Abs: 0.7 10*3/uL (ref 0.7–4.0)
MCH: 21 pg — AB (ref 26.0–34.0)
MCHC: 30.6 g/dL (ref 30.0–36.0)
MCV: 68.5 fL — AB (ref 78.0–100.0)
MONO ABS: 0.4 10*3/uL (ref 0.1–1.0)
Monocytes Relative: 7 %
NEUTROS ABS: 4.1 10*3/uL (ref 1.7–7.7)
NEUTROS PCT: 80 %
Platelets: 77 10*3/uL — ABNORMAL LOW (ref 150–400)
RBC: 3.72 MIL/uL — ABNORMAL LOW (ref 4.22–5.81)
RDW: 18.1 % — ABNORMAL HIGH (ref 11.5–15.5)
WBC: 5.2 10*3/uL (ref 4.0–10.5)

## 2015-06-15 LAB — COMPREHENSIVE METABOLIC PANEL
ALT: 39 U/L (ref 17–63)
AST: 23 U/L (ref 15–41)
Albumin: 2.7 g/dL — ABNORMAL LOW (ref 3.5–5.0)
Alkaline Phosphatase: 34 U/L — ABNORMAL LOW (ref 38–126)
Anion gap: 10 (ref 5–15)
BILIRUBIN TOTAL: 0.5 mg/dL (ref 0.3–1.2)
BUN: 47 mg/dL — AB (ref 6–20)
CALCIUM: 8.4 mg/dL — AB (ref 8.9–10.3)
CO2: 28 mmol/L (ref 22–32)
CREATININE: 1.39 mg/dL — AB (ref 0.61–1.24)
Chloride: 107 mmol/L (ref 101–111)
GFR calc Af Amer: 51 mL/min — ABNORMAL LOW (ref >60)
GFR, EST NON AFRICAN AMERICAN: 44 mL/min — AB (ref >60)
Glucose, Bld: 97 mg/dL (ref 65–99)
Potassium: 4.7 mmol/L (ref 3.5–5.1)
Sodium: 145 mmol/L (ref 135–145)
TOTAL PROTEIN: 5.5 g/dL — AB (ref 6.5–8.1)

## 2015-06-15 LAB — CULTURE, RESPIRATORY W GRAM STAIN

## 2015-06-15 LAB — CULTURE, RESPIRATORY: CULTURE: NORMAL

## 2015-06-15 LAB — MAGNESIUM: MAGNESIUM: 2.3 mg/dL (ref 1.7–2.4)

## 2015-06-15 MED ORDER — ENSURE ENLIVE PO LIQD
237.0000 mL | Freq: Three times a day (TID) | ORAL | Status: DC
  Administered 2015-06-16 – 2015-06-20 (×7): 237 mL via ORAL

## 2015-06-15 NOTE — Progress Notes (Signed)
PROGRESS NOTE    [name] Azure, MD  [id number] DOB: 17-Sep-1925 DOA: 06/11/2015 PCP: [name], MD   Brief Narrative:  82 y.o. WM ([place]) PMHx COPD on home O2 (2.5 L at night), CAD s/p PCI 3 in 2011, cardiac arrest and VF s/p ICD, chronic systolic CHF last EF [id number]   Presented with dyspnea, fever, and yellow sputum. The patient started to develop cough productive of yellow sputum about a week prior accompanied by dyspnea. He saw his Cardiologist who noted fluid overload, and prescribed Lasix for three days. There was no fever and a CXR was negative at that time. Symptoms persisted and he went to his PCP four days prior who prescribed prednisone, Abx, and increased albuterol. The night of his admission the patient woke with chills, sweats, worsening cough, and he couldn't breathe.   In the ED the pt was in respiratory distress, and febrile to 104.81F rectally. CXR without opacity. Lactate 2.95.   Assessment & Plan:   Principal Problem:   Sepsis ([place]) Active Problems:   Anemia of chronic disease   Chronic kidney disease, stage 3   Automatic implantable cardioverter-defibrillator in situ   Chronic systolic CHF (congestive heart failure) (HCC)   Acute respiratory failure (HCC)   COPD exacerbation (HCC)   Severe protein-calorie malnutrition (HCC)   Sepsis due to Escherichia coli (E. coli) (HCC)   Bacteremia, escherichia coli   Pyelonephritis due to Escherichia coli   Dysphagia   Cardiomyopathy, ischemic   CKD (chronic kidney disease), stage III   Sepsis due to pneumonia ([place])   Hyperkalemia   CAP (community acquired pneumonia)   Microcytic anemia   Sepsis secondary w/positive E coli bacteremia due to positive E coli Pyelonephritis -Lactic acidosis resolved -DuoNeb QID -Pulmicort BID -Solu-Medrol 60 mg daily -Flutter valve -Physiotherapy vest BID -Will require 2 weeks antibiotics; on 5/20 transition to PO -Repeat blood culture 5/19 pending  COPD  exacerbation/CAP/ Acute on chronic hypoxic respiratory failure  -On home 2.5 L O2 at night  -Flu negative  -See sepsis -Continued copious amounts of thick yellow sputum -Ambulatory SPO2 5/20  Primary Esophageal Dysphagia -After patient's respiratory status improved will consult GI prior to discharge possible stricture?  CAD s/p [id number] -Asymptomatic   Chronic systolic CHF- Ischemic Cardiomyopathy - AICD EF 55-60% w/ grade 1 DD via TTE 06/06/15 -Daily weight Filed Weights   06/13/15 0409 06/14/15 0449 06/15/15 0421  Weight: 84.9 kg (187 lb 2.7 oz) 84.142 kg (185 lb 8 oz) 83.598 kg (184 lb 4.8 oz)  -Strict in and out since admission +833ml -Transfuse for hemoglobin<8   Recent Labs Lab 06/11/15 0741 06/12/15 0432 06/13/15 0501 06/14/15 0510 06/15/15 0517  HGB 7.7* 7.5* 8.0* 8.0* 7.8*  -On 5/20 contact CHF clinic/PCC M and request cardiopulmonary rehabilitation on discharge    Mild AoS -Per TTE 06/06/15 -See CHF  CKD stage III  Lab Results  Component Value Date   CREATININE 1.39* 06/15/2015   CREATININE 1.42* 06/14/2015   CREATININE 1.60* 06/13/2015   Chronic microcytic anemia(baseline Hgb appears to be ~9 ) -B thalassemia and chronic disease   Recent Labs Lab 06/11/15 0145 06/11/15 0741 06/12/15 0432 06/13/15 0501  HGB 9.1* 7.7* 7.5* 8.0*   Hyperkalemia -Resolved  GERD -Pepcid 20 mg PRN  -Protonix 40 mg BID      Rimantadine DVT prophylaxis:Lovenox Code Status: FULL - pt clear he would desire "short term" vent support but not "long term"  Family Communication: Daughter at bedside Disposition Plan: Resolution sepsis   Consultants:  NA  Procedures/Significant Events:  5/10 echocardiogram;Left ventricle: mild LVH. -LVEF= 55% to 60%. -(grade 1 diastolic dysfunction).  5/16 barium swallow; primary esophageal dysphagia. Incomplete clearance barium through his esophagus. -Moderate aspiration risk  Cultures 5/15 blood right forearm 2  positive Escherichia coli 5/15 urine positive Escherichia coli 5/15 influenza panel negative  5/16 sputum Normal flora 5/19 blood pending   Antimicrobials: Zosyn 5/15 1 dose Vanc 5/15 1 dose Azithromycin 5/151 dose Rocephin 5/15 >>   Devices    LINES / TUBES:      Continuous Infusions: . sodium chloride Stopped (06/13/15 2300)     Subjective: 5/19 A/O 4, states respiratory status improved. Normally on 2.5 L O2 at home at nightStates has never participated in pulmonary or cardiac rehabilitation.. Patient was able to eat today but has some increased GERD.    Objective: Filed Vitals:   06/15/15 1314 06/15/15 1701 06/15/15 1724 06/15/15 1921  BP:  151/68 151/68 131/66  Pulse: 69 65 65 51  Temp:  99 F (37.2 C)  98 F (36.7 C)  TempSrc:  Oral  Oral  Resp: 19 20 20 26   Height:      Weight:      SpO2: 95% 100% 100% 94%    Intake/Output Summary (Last 24 hours) at 06/15/15 2015 Last data filed at 06/15/15 1926  Gross per 24 hour  Intake     50 ml  Output   2850 ml  Net  -2800 ml   Filed Weights   06/13/15 0409 06/14/15 0449 06/15/15 0421  Weight: 84.9 kg (187 lb 2.7 oz) 84.142 kg (185 lb 8 oz) 83.598 kg (184 lb 4.8 oz)    Examination:  General: A/O 4, positive acute on chronic respiratory distress but improved. Eyes: negative scleral hemorrhage, negative anisocoria, negative icterus ENT: Negative Runny nose, negative gingival bleeding, Neck:  Negative scars, masses, torticollis, lymphadenopathy, JVD Lungs:  positive rhonchi (improved from 5/17), with positive productive cough (yellow/blood-tinged ),Negative wheezes, negative crackles  Cardiovascular: Regular rate and rhythm without murmur gallop or rub normal S1 and S2 Abdomen: negative abdominal pain, nondistended, positive soft, bowel sounds, no rebound, no ascites, no appreciable mass Extremities: No significant cyanosis, clubbing, or edema bilateral lower extremities Skin: Negative rashes, lesions,  ulcers Psychiatric:  Negative depression, negative anxiety, negative fatigue, negative mania Central nervous system:  Cranial nerves II through XII intact, tongue/uvula midline, all extremities muscle strength 5/5, sensation intact throughout, finger nose finger bilateral within normal limits, quick finger touch bilateral within normal limits,  negative dysarthria, negative expressive aphasia, negative receptive aphasia.  .     Data Reviewed: Care during the described time interval was provided by me .  I have reviewed this patient's available data, including medical history, events of note, physical examination, and all test results as part of my evaluation. I have personally reviewed and interpreted all radiology studies.  CBC:  Recent Labs Lab 06/11/15 0145 06/11/15 0741 06/12/15 0432 06/13/15 0501 06/14/15 0510 06/15/15 0517  WBC 4.6 16.4* 10.9* 7.2 3.9* 5.2  NEUTROABS 4.3  --   --   --  3.6 4.1  HGB 9.1* 7.7* 7.5* 8.0* 8.0* 7.8*  HCT 29.2* 25.0* 23.4* 25.7* 25.5* 25.5*  MCV 67.9* 68.1* 68.4* 68.2* 67.5* 68.5*  PLT 98* 76* 76* 90* 82* 77*   Basic Metabolic Panel:  Recent Labs Lab 06/11/15 0145 06/11/15 0741 06/12/15 0432 06/13/15 0501 06/14/15 0510 06/15/15 0517  NA 137  --  142 143 143 145  K 4.9  --  5.2* 5.0 5.3* 4.7  CL 104  --  111 111 108 107  CO2 22  --  21* 20* 25 28  GLUCOSE 116*  --  167* 183* 179* 97  BUN 53*  --  45* 54* 47* 47*  CREATININE 1.82* 1.90* 1.57* 1.60* 1.42* 1.39*  CALCIUM 8.4*  --  7.7* 8.1* 8.4* 8.4*  MG  --   --   --   --  2.6* 2.3   GFR: Estimated Creatinine Clearance: 39.1 mL/min (by C-G formula based on Cr of 1.39). Liver Function Tests:  Recent Labs Lab 06/11/15 0145 06/12/15 0432 06/13/15 0501 06/14/15 0510 06/15/15 0517  AST 39 31 29 23 23   ALT 33 53 47 43 39  ALKPHOS 59 42 42 35* 34*  BILITOT 0.7 0.3 0.5 0.3 0.5  PROT 6.9 5.4* 5.6* 5.5* 5.5*  ALBUMIN 3.5 2.7* 2.8* 2.6* 2.7*   No results for input(s): LIPASE,  AMYLASE in the last 168 hours. No results for input(s): AMMONIA in the last 168 hours. Coagulation Profile: No results for input(s): INR, PROTIME in the last 168 hours. Cardiac Enzymes: No results for input(s): CKTOTAL, CKMB, CKMBINDEX, TROPONINI in the last 168 hours. BNP (last 3 results) No results for input(s): PROBNP in the last 8760 hours. HbA1C: No results for input(s): HGBA1C in the last 72 hours. CBG: No results for input(s): GLUCAP in the last 168 hours. Lipid Profile: No results for input(s): CHOL, HDL, LDLCALC, TRIG, CHOLHDL, LDLDIRECT in the last 72 hours. Thyroid Function Tests: No results for input(s): TSH, T4TOTAL, FREET4, T3FREE, THYROIDAB in the last 72 hours. Anemia Panel: No results for input(s): VITAMINB12, FOLATE, FERRITIN, TIBC, IRON, RETICCTPCT in the last 72 hours. Urine analysis:    Component Value Date/Time   COLORURINE YELLOW 06/11/2015 0515   APPEARANCEUR CLOUDY* 06/11/2015 0515   LABSPEC 1.023 06/11/2015 0515   LABSPEC 1.025 05/01/2010 1454   PHURINE 5.0 06/11/2015 0515   PHURINE 5.0 05/01/2010 1454   GLUCOSEU NEGATIVE 06/11/2015 0515   HGBUR LARGE* 06/11/2015 0515   HGBUR Negative 05/01/2010 1454   BILIRUBINUR NEGATIVE 06/11/2015 0515   BILIRUBINUR Negative 05/01/2010 1454   KETONESUR NEGATIVE 06/11/2015 0515   KETONESUR Negative 05/01/2010 1454   PROTEINUR NEGATIVE 06/11/2015 0515   PROTEINUR Negative 05/01/2010 1454   UROBILINOGEN 0.2 06/07/2014 2018   NITRITE POSITIVE* 06/11/2015 0515   NITRITE Negative 05/01/2010 1454   LEUKOCYTESUR MODERATE* 06/11/2015 0515   LEUKOCYTESUR Negative 05/01/2010 1454   Sepsis Labs: @LABRCNTIP (procalcitonin:4,lacticidven:4)  ) Recent Results (from the past 240 hour(s))  Blood Culture (routine x 2)     Status: Abnormal   Collection Time: 06/11/15  1:45 AM  Result Value Ref Range Status   Specimen Description BLOOD LEFT FOREARM  Final   Special Requests BOTTLES DRAWN AEROBIC AND ANAEROBIC 5CC  Final    Culture  Setup Time   Final    GRAM NEGATIVE RODS IN BOTH AEROBIC AND ANAEROBIC BOTTLES CRITICAL RESULT CALLED TO, READ BACK BY AND VERIFIED WITH: A MEYER PHARMD 1737 06/11/15 A BROWNING    Culture (A)  Final    ESCHERICHIA COLI SUSCEPTIBILITIES PERFORMED ON PREVIOUS CULTURE WITHIN THE LAST 5 DAYS.    Report Status 06/13/2015 FINAL  Final  Blood Culture (routine x 2)     Status: Abnormal   Collection Time: 06/11/15  1:50 AM  Result Value Ref Range Status   Specimen Description BLOOD RIGHT FOREARM  Final   Special Requests BOTTLES DRAWN AEROBIC AND ANAEROBIC 5CC   Final  Culture  Setup Time   Final    GRAM NEGATIVE RODS IN BOTH AEROBIC AND ANAEROBIC BOTTLES Organism ID to follow CRITICAL RESULT CALLED TO, READ BACK BY AND VERIFIED WITH: A MEYER PHARMD 1737 06/11/15 A BROWNING    Culture ESCHERICHIA COLI (A)  Final   Report Status 06/13/2015 FINAL  Final   Organism ID, Bacteria ESCHERICHIA COLI  Final      Susceptibility   Escherichia coli - MIC*    AMPICILLIN <=2 SENSITIVE Sensitive     CEFAZOLIN <=4 SENSITIVE Sensitive     CEFEPIME <=1 SENSITIVE Sensitive     CEFTAZIDIME <=1 SENSITIVE Sensitive     CEFTRIAXONE <=1 SENSITIVE Sensitive     CIPROFLOXACIN <=0.25 SENSITIVE Sensitive     GENTAMICIN <=1 SENSITIVE Sensitive     IMIPENEM <=0.25 SENSITIVE Sensitive     TRIMETH/SULFA <=20 SENSITIVE Sensitive     AMPICILLIN/SULBACTAM <=2 SENSITIVE Sensitive     PIP/TAZO <=4 SENSITIVE Sensitive     * ESCHERICHIA COLI  Blood Culture ID Panel (Reflexed)     Status: Abnormal   Collection Time: 06/11/15  1:50 AM  Result Value Ref Range Status   Enterococcus species NOT DETECTED NOT DETECTED Final   Vancomycin resistance NOT DETECTED NOT DETECTED Final   Listeria monocytogenes NOT DETECTED NOT DETECTED Final   Staphylococcus species NOT DETECTED NOT DETECTED Final   Staphylococcus aureus NOT DETECTED NOT DETECTED Final   Methicillin resistance NOT DETECTED NOT DETECTED Final    Streptococcus species NOT DETECTED NOT DETECTED Final   Streptococcus agalactiae NOT DETECTED NOT DETECTED Final   Streptococcus pneumoniae NOT DETECTED NOT DETECTED Final   Streptococcus pyogenes NOT DETECTED NOT DETECTED Final   Acinetobacter baumannii NOT DETECTED NOT DETECTED Final   Enterobacteriaceae species NOT DETECTED NOT DETECTED Final   Enterobacter cloacae complex NOT DETECTED NOT DETECTED Final   Escherichia coli DETECTED (A) NOT DETECTED Final    Comment: CRITICAL RESULT CALLED TO, READ BACK BY AND VERIFIED WITH: A MEYER PHARMD 1737 06/11/15 A BROWNING    Klebsiella oxytoca NOT DETECTED NOT DETECTED Final   Klebsiella pneumoniae NOT DETECTED NOT DETECTED Final   Proteus species NOT DETECTED NOT DETECTED Final   Serratia marcescens NOT DETECTED NOT DETECTED Final   Carbapenem resistance NOT DETECTED NOT DETECTED Final   Haemophilus influenzae NOT DETECTED NOT DETECTED Final   Neisseria meningitidis NOT DETECTED NOT DETECTED Final   Pseudomonas aeruginosa NOT DETECTED NOT DETECTED Final   Candida albicans NOT DETECTED NOT DETECTED Final   Candida glabrata NOT DETECTED NOT DETECTED Final   Candida krusei NOT DETECTED NOT DETECTED Final   Candida parapsilosis NOT DETECTED NOT DETECTED Final   Candida tropicalis NOT DETECTED NOT DETECTED Final  Urine culture     Status: Abnormal   Collection Time: 06/11/15  5:16 AM  Result Value Ref Range Status   Specimen Description URINE, CATHETERIZED  Final   Special Requests NONE  Final   Culture 70,000 COLONIES/mL ESCHERICHIA COLI (A)  Final   Report Status 06/13/2015 FINAL  Final   Organism ID, Bacteria ESCHERICHIA COLI (A)  Final      Susceptibility   Escherichia coli - MIC*    AMPICILLIN <=2 SENSITIVE Sensitive     CEFAZOLIN <=4 SENSITIVE Sensitive     CEFTRIAXONE <=1 SENSITIVE Sensitive     CIPROFLOXACIN <=0.25 SENSITIVE Sensitive     GENTAMICIN <=1 SENSITIVE Sensitive     IMIPENEM <=0.25 SENSITIVE Sensitive      NITROFURANTOIN <=16 SENSITIVE Sensitive  TRIMETH/SULFA <=20 SENSITIVE Sensitive     AMPICILLIN/SULBACTAM <=2 SENSITIVE Sensitive     PIP/TAZO <=4 SENSITIVE Sensitive     * 70,000 COLONIES/mL ESCHERICHIA COLI  MRSA PCR Screening     Status: None   Collection Time: 06/11/15  7:46 AM  Result Value Ref Range Status   MRSA by PCR NEGATIVE NEGATIVE Final    Comment:        The GeneXpert MRSA Assay (FDA approved for NASAL specimens only), is one component of a comprehensive MRSA colonization surveillance program. It is not intended to diagnose MRSA infection nor to guide or monitor treatment for MRSA infections.   Culture, sputum-assessment     Status: None   Collection Time: 06/12/15  8:20 AM  Result Value Ref Range Status   Specimen Description SPUTUM  Final   Special Requests NONE  Final   Sputum evaluation   Final    THIS SPECIMEN IS ACCEPTABLE. RESPIRATORY CULTURE REPORT TO FOLLOW.   Report Status 06/12/2015 FINAL  Final  Culture, respiratory (NON-Expectorated)     Status: None   Collection Time: 06/12/15  8:20 AM  Result Value Ref Range Status   Specimen Description SPUTUM  Final   Special Requests NONE  Final   Gram Stain   Final    MODERATE WBC PRESENT,BOTH PMN AND MONONUCLEAR RARE SQUAMOUS EPITHELIAL CELLS PRESENT NO ORGANISMS SEEN THIS SPECIMEN IS ACCEPTABLE FOR SPUTUM CULTURE Performed at [facility]    Culture   Final    NORMAL OROPHARYNGEAL FLORA Performed at [facility]    Report Status 06/15/2015 FINAL  Final         Radiology Studies: Dg Chest Port 1 View  06/14/2015  CLINICAL DATA:  Pneumonia. EXAM: PORTABLE CHEST 1 VIEW COMPARISON:  06/11/2015. FINDINGS: Cardiac pacer noted lead tips in right atrium right ventricle. Cardiomegaly with normal pulmonary vascularity. Minimal right perihilar right base infiltrate cannot be excluded. No pleural effusion or pneumothorax. IMPRESSION: 1. Cardiac pacer in stable position. Cardiomegaly. No  pulmonary venous congestion. 2. Minimal right perihilar and right base infiltrate cannot be excluded . Electronically Signed   By: Marcello Moores  Register   On: 06/14/2015 08:05        Scheduled Meds: . aspirin EC  81 mg Oral QHS  . budesonide  0.5 mg Nebulization BID  . cefTRIAXone (ROCEPHIN)  IV  2 g Intravenous Q24H  . clopidogrel  75 mg Oral Daily  . enoxaparin (LOVENOX) injection  40 mg Subcutaneous Q24H  . feeding supplement (ENSURE ENLIVE)  237 mL Oral TID BM  . guaiFENesin  600 mg Oral BID  . ipratropium-albuterol  3 mL Nebulization QID  . methylPREDNISolone (SOLU-MEDROL) injection  60 mg Intravenous Daily  . pantoprazole  40 mg Oral BID  . polyethylene glycol  17 g Oral Daily  . rosuvastatin  5 mg Oral Daily  . senna-docusate  1 tablet Oral BID  . sodium chloride flush  3 mL Intravenous Q12H  . vitamin B-12  5,000 mcg Oral Daily   Continuous Infusions: . sodium chloride Stopped (06/13/15 2300)     LOS: 4 days    Time spent: 40 minutes    WOODS, Geraldo Docker, MD [facility] Pager [phone]   If 7PM-7AM, please contact night-coverage [email or web address] Password [email or web address] 06/15/2015, 8:15 PM

## 2015-06-15 NOTE — Progress Notes (Signed)
Physical Therapy Treatment Patient Details Name: [name] Huneke, MD MRN: [id number] DOB: [birth year] Today's Date: Jun 19, 2015    History of Present Illness 82 y.o. male with a history of COPD on home O2, CAD s/p PCI 3 in 2011, cardiac arrest and VF s/p ICD, chronic systolic CHF last EF [id number] who presented with dyspnea, fever, and yellow sputum.    PT Comments    Patient progressing with OOB and in hallway activity in w/c.  Feel continued skilled PT in the acute setting will assist for d/c home with follow up HHPT.   Follow Up Recommendations  Home health PT     Equipment Recommendations  None recommended by PT    Recommendations for Other Services       Precautions / Restrictions Precautions Precautions: Fall Precaution Comments: O2 dependent    Mobility  Bed Mobility Overal bed mobility: Modified Independent                Transfers Overall transfer level: Needs assistance     Sit to Stand: Supervision Stand pivot transfers: Min assist       General transfer comment: assist for safety/balance  Ambulation/Gait                 Hotel manager mobility: Yes Wheelchair propulsion: Both upper extremities Wheelchair parts: Needs assistance Distance: 60 Wheelchair Assistance Details (indicate cue type and reason): assist to keep wider w/c straight as pt propelling (min A) and assist for all parts  Modified Rankin (Stroke Patients Only)       Balance                                    Cognition Arousal/Alertness: Awake/alert Behavior During Therapy: WFL for tasks assessed/performed Overall Cognitive Status: Within Functional Limits for tasks assessed                      Exercises      General Comments        Pertinent Vitals/Pain Pain Assessment: No/denies pain    Home Living                      Prior Function            PT Goals  (current goals can now be found in the care plan section) Progress towards PT goals: Progressing toward goals    Frequency  Min 3X/week    PT Plan Current plan remains appropriate    Co-evaluation             End of Session Equipment Utilized During Treatment: Oxygen Activity Tolerance: Patient limited by fatigue Patient left: in bed;with call bell/phone within reach;with family/visitor present     Time: [id number] PT Time Calculation (min) (ACUTE ONLY): 34 min  Charges:  $Therapeutic Activity: 8-22 mins $Wheel Chair Management: 8-22 mins                    G Codes:      Reginia Naas 2015/06/19, 12:14 PM  Magda Kiel, [place] 2015-06-19

## 2015-06-15 NOTE — Care Management Note (Signed)
Case Management Note  Patient Details  Name: [name] Spitale, MD MRN: [id number] Date of Birth: [birth year]  Subjective/Objective:  NCM spoke with patient again today, to see if he has made a decision about the [facility] agency he would like to work with, he states he would like to work with [facility] and he would like the physical therapist, [name] [name].  NCM made referral to [facility], Butch Penny notified, physical therapist name given to Butch Penny also.  Soc will begin 24-48 hrs post discharge.                    Action/Plan:   Expected Discharge Date:                  Expected Discharge Plan:  [place]  In-House Referral:     Discharge planning Services  CM Consult  Post Acute Care Choice:    Choice offered to:     DME Arranged:    DME Agency:     HH Arranged:  PT, OT HH Agency:  [place]  Status of Service:  Completed, signed off  Medicare Important Message Given:  Yes Date Medicare IM Given:    Medicare IM give by:    Date Additional Medicare IM Given:    Additional Medicare Important Message give by:     If discussed at [place] of Stay Meetings, dates discussed:    Additional Comments:  Zenon Mayo, RN 06/15/2015, 2:00 PM

## 2015-06-16 LAB — CBC WITH DIFFERENTIAL/PLATELET
BASOS ABS: 0 10*3/uL (ref 0.0–0.1)
Basophils Relative: 0 %
EOS ABS: 0 10*3/uL (ref 0.0–0.7)
Eosinophils Relative: 0 %
HCT: 25.8 % — ABNORMAL LOW (ref 39.0–52.0)
HEMOGLOBIN: 8 g/dL — AB (ref 13.0–17.0)
LYMPHS PCT: 14 %
Lymphs Abs: 0.6 10*3/uL — ABNORMAL LOW (ref 0.7–4.0)
MCH: 20.9 pg — ABNORMAL LOW (ref 26.0–34.0)
MCHC: 31 g/dL (ref 30.0–36.0)
MCV: 67.5 fL — ABNORMAL LOW (ref 78.0–100.0)
MONO ABS: 0.3 10*3/uL (ref 0.1–1.0)
Monocytes Relative: 7 %
NEUTROS ABS: 3.7 10*3/uL (ref 1.7–7.7)
Neutrophils Relative %: 79 %
PLATELETS: 68 10*3/uL — AB (ref 150–400)
RBC: 3.82 MIL/uL — ABNORMAL LOW (ref 4.22–5.81)
RDW: 17.4 % — AB (ref 11.5–15.5)
WBC: 4.6 10*3/uL (ref 4.0–10.5)

## 2015-06-16 LAB — COMPREHENSIVE METABOLIC PANEL
ALBUMIN: 2.6 g/dL — AB (ref 3.5–5.0)
ALK PHOS: 32 U/L — AB (ref 38–126)
ALT: 33 U/L (ref 17–63)
ANION GAP: 9 (ref 5–15)
AST: 17 U/L (ref 15–41)
BUN: 39 mg/dL — AB (ref 6–20)
CALCIUM: 8.5 mg/dL — AB (ref 8.9–10.3)
CO2: 31 mmol/L (ref 22–32)
Chloride: 104 mmol/L (ref 101–111)
Creatinine, Ser: 1.22 mg/dL (ref 0.61–1.24)
GFR calc non Af Amer: 51 mL/min — ABNORMAL LOW (ref >60)
GFR, EST AFRICAN AMERICAN: 59 mL/min — AB (ref >60)
GLUCOSE: 122 mg/dL — AB (ref 65–99)
POTASSIUM: 4.7 mmol/L (ref 3.5–5.1)
SODIUM: 144 mmol/L (ref 135–145)
TOTAL PROTEIN: 5.4 g/dL — AB (ref 6.5–8.1)
Total Bilirubin: 0.5 mg/dL (ref 0.3–1.2)

## 2015-06-16 LAB — PREPARE RBC (CROSSMATCH)

## 2015-06-16 LAB — MAGNESIUM: Magnesium: 2.1 mg/dL (ref 1.7–2.4)

## 2015-06-16 MED ORDER — SODIUM CHLORIDE 0.9 % IV SOLN: Freq: Once | INTRAVENOUS | Status: DC

## 2015-06-16 NOTE — Care Management Important Message (Signed)
Important Message  Patient Details  Name: [name] Wichert, MD MRN: [id number] Date of Birth: Dec 16, 1925   Medicare Important Message Given:  Yes    [name] 06/16/2015, 11:04 AM

## 2015-06-16 NOTE — Progress Notes (Signed)
After speaking more with the physician the patient has agreed to blood and was consented to receive.

## 2015-06-16 NOTE — Progress Notes (Signed)
Patient has refused use of chest vest for the night. RT will continue to monitor.

## 2015-06-16 NOTE — Progress Notes (Signed)
PROGRESS NOTE    [name] Azure, MD  [id number] DOB: 09-27-1925 DOA: 06/11/2015 PCP: [name], MD   Brief Narrative:  82 y.o. WM ([place]) PMHx COPD on home O2 (2.5 L at night), CAD s/p PCI 3 in 2011, cardiac arrest and VF s/p ICD, chronic systolic CHF last EF [id number]   Presented with dyspnea, fever, and yellow sputum. The patient started to develop cough productive of yellow sputum about a week prior accompanied by dyspnea. He saw his Cardiologist who noted fluid overload, and prescribed Lasix for three days. There was no fever and a CXR was negative at that time. Symptoms persisted and he went to his PCP four days prior who prescribed prednisone, Abx, and increased albuterol. The night of his admission the patient woke with chills, sweats, worsening cough, and he couldn't breathe.   In the ED the pt was in respiratory distress, and febrile to 104.76F rectally. CXR without opacity. Lactate 2.95.   Assessment & Plan:   Principal Problem:   Sepsis ([place]) Active Problems:   Anemia of chronic disease   Chronic kidney disease, stage 3   Automatic implantable cardioverter-defibrillator in situ   Chronic systolic CHF (congestive heart failure) (HCC)   Acute respiratory failure (HCC)   COPD exacerbation (HCC)   Severe protein-calorie malnutrition (HCC)   Sepsis due to Escherichia coli (E. coli) (HCC)   Bacteremia, escherichia coli   Pyelonephritis due to Escherichia coli   Dysphagia   Cardiomyopathy, ischemic   CKD (chronic kidney disease), stage III   Sepsis due to pneumonia ([place])   Hyperkalemia   CAP (community acquired pneumonia)   Microcytic anemia   Sepsis/ positive E coli bacteremia due to positive E coli Pyelonephritis -Lactic acidosis resolved -DuoNeb QID -Pulmicort BID -Solu-Medrol 60 mg daily -Flutter valve -Physiotherapy vest BID -Will require 2 weeks antibiotics; on 5/20 transition to PO -Repeat blood culture 5/19 pending  COPD exacerbation/CAP/ Acute  on chronic hypoxic respiratory failure  -On home 2.5 L O2 at night  -Flu negative  -See sepsis -Continued copious amounts of thick yellow sputum -Ambulatory SPO2 5/20  Primary Esophageal Dysphagia -After patient's respiratory status improved will consult GI prior to discharge possible stricture?  CAD s/p [id number] -Asymptomatic   Chronic systolic CHF- Ischemic Cardiomyopathy - AICD EF 55-60% w/ grade 1 DD via TTE 06/06/15 -Daily weight Filed Weights   06/14/15 0449 06/15/15 0421 06/16/15 0437  Weight: 84.142 kg (185 lb 8 oz) 83.598 kg (184 lb 4.8 oz) 81.965 kg (180 lb 11.2 oz)  -Strict in and out since admission +873ml -Transfuse for hemoglobin<8  Recent Labs Lab 06/12/15 0432 06/13/15 0501 06/14/15 0510 06/15/15 0517 06/16/15 0426  HGB 7.5* 8.0* 8.0* 7.8* 8.0*  -On 5/20 contact CHF clinic/PCC M and request cardiopulmonary rehabilitation on discharge  -5/20 transfuse 1 unit PRBC   Mild AoS -Per TTE 06/06/15 -See CHF  CKD stage III  Lab Results  Component Value Date   CREATININE 1.22 06/16/2015   CREATININE 1.39* 06/15/2015   CREATININE 1.42* 06/14/2015   Chronic microcytic anemia(baseline Hgb appears to be ~9 ) -B thalassemia and chronic disease   Recent Labs Lab 06/11/15 0145 06/11/15 0741 06/12/15 0432 06/13/15 0501  HGB 9.1* 7.7* 7.5* 8.0*  -Occult blood card 3  Hyperkalemia -Resolved  GERD -Pepcid 20 mg PRN  -Protonix 40 mg BID      Rimantadine DVT prophylaxis:Lovenox Code Status: FULL - pt clear he would desire "short term" vent support but not "long term"  Family Communication: Daughter at bedside  Disposition Plan: Resolution sepsis   Consultants:  NA  Procedures/Significant Events:  5/10 echocardiogram;Left ventricle: mild LVH. -LVEF= 55% to 60%. -(grade 1 diastolic dysfunction).  5/16 barium swallow; primary esophageal dysphagia. Incomplete clearance barium through his esophagus. -Moderate aspiration risk 5/20 Transfuse 1  unit PRBC  Cultures 5/15 blood right forearm 2 positive Escherichia coli 5/15 urine positive Escherichia coli 5/15 influenza panel negative  5/16 sputum Normal flora 5/19 blood pending   Antimicrobials: Zosyn 5/15 1 dose Vanc 5/15 1 dose Azithromycin 5/151 dose Rocephin 5/15 >>   Devices    LINES / TUBES:      Continuous Infusions: . sodium chloride Stopped (06/13/15 2300)     Subjective: 5/20 A/O 4, states respiratory status improved.      Objective: Filed Vitals:   06/15/15 2345 06/16/15 0437 06/16/15 0746 06/16/15 0848  BP: 153/80 136/62 143/67   Pulse: 62 60 68   Temp: 97.3 F (36.3 C) 97.4 F (36.3 C) 97.8 F (36.6 C)   TempSrc: Oral Axillary Oral   Resp: 23 21 23    Height:  5\' 11"  (1.803 m)    Weight:  81.965 kg (180 lb 11.2 oz)    SpO2: 100% 100% 96% 96%    Intake/Output Summary (Last 24 hours) at 06/16/15 0906 Last data filed at 06/16/15 0749  Gross per 24 hour  Intake      3 ml  Output   2525 ml  Net  -2522 ml   Filed Weights   06/14/15 0449 06/15/15 0421 06/16/15 0437  Weight: 84.142 kg (185 lb 8 oz) 83.598 kg (184 lb 4.8 oz) 81.965 kg (180 lb 11.2 oz)    Examination:  General: A/O 4, positive acute on chronic respiratory distress but improved. Eyes: negative scleral hemorrhage, negative anisocoria, negative icterus ENT: Negative Runny nose, negative gingival bleeding, Neck:  Negative scars, masses, torticollis, lymphadenopathy, JVD Lungs:  positive rhonchi (improved from 5/17), with positive productive cough (yellow/blood-tinged ),Negative wheezes, negative crackles  Cardiovascular: Regular rate and rhythm without murmur gallop or rub normal S1 and S2 Abdomen: negative abdominal pain, nondistended, positive soft, bowel sounds, no rebound, no ascites, no appreciable mass Extremities: No significant cyanosis, clubbing, or edema bilateral lower extremities Skin: Negative rashes, lesions, ulcers Psychiatric:  Negative depression,  negative anxiety, negative fatigue, negative mania Central nervous system:  Cranial nerves II through XII intact, tongue/uvula midline, all extremities muscle strength 5/5, sensation intact throughout, finger nose finger bilateral within normal limits, quick finger touch bilateral within normal limits,  negative dysarthria, negative expressive aphasia, negative receptive aphasia.  .     Data Reviewed: Care during the described time interval was provided by me .  I have reviewed this patient's available data, including medical history, events of note, physical examination, and all test results as part of my evaluation. I have personally reviewed and interpreted all radiology studies.  CBC:  Recent Labs Lab 06/11/15 0145  06/12/15 0432 06/13/15 0501 06/14/15 0510 06/15/15 0517 06/16/15 0426  WBC 4.6  < > 10.9* 7.2 3.9* 5.2 4.6  NEUTROABS 4.3  --   --   --  3.6 4.1 3.7  HGB 9.1*  < > 7.5* 8.0* 8.0* 7.8* 8.0*  HCT 29.2*  < > 23.4* 25.7* 25.5* 25.5* 25.8*  MCV 67.9*  < > 68.4* 68.2* 67.5* 68.5* 67.5*  PLT 98*  < > 76* 90* 82* 77* 68*  < > = values in this interval not displayed. Basic Metabolic Panel:  Recent Labs Lab 06/12/15 0432 06/13/15  0501 06/14/15 0510 06/15/15 0517 06/16/15 0426  NA 142 143 143 145 144  K 5.2* 5.0 5.3* 4.7 4.7  CL 111 111 108 107 104  CO2 21* 20* 25 28 31   GLUCOSE 167* 183* 179* 97 122*  BUN 45* 54* 47* 47* 39*  CREATININE 1.57* 1.60* 1.42* 1.39* 1.22  CALCIUM 7.7* 8.1* 8.4* 8.4* 8.5*  MG  --   --  2.6* 2.3 2.1   GFR: Estimated Creatinine Clearance: 44.6 mL/min (by C-G formula based on Cr of 1.22). Liver Function Tests:  Recent Labs Lab 06/12/15 0432 06/13/15 0501 06/14/15 0510 06/15/15 0517 06/16/15 0426  AST 31 29 23 23 17   ALT 53 47 43 39 33  ALKPHOS 42 42 35* 34* 32*  BILITOT 0.3 0.5 0.3 0.5 0.5  PROT 5.4* 5.6* 5.5* 5.5* 5.4*  ALBUMIN 2.7* 2.8* 2.6* 2.7* 2.6*   No results for input(s): LIPASE, AMYLASE in the last 168 hours. No  results for input(s): AMMONIA in the last 168 hours. Coagulation Profile: No results for input(s): INR, PROTIME in the last 168 hours. Cardiac Enzymes: No results for input(s): CKTOTAL, CKMB, CKMBINDEX, TROPONINI in the last 168 hours. BNP (last 3 results) No results for input(s): PROBNP in the last 8760 hours. HbA1C: No results for input(s): HGBA1C in the last 72 hours. CBG: No results for input(s): GLUCAP in the last 168 hours. Lipid Profile: No results for input(s): CHOL, HDL, LDLCALC, TRIG, CHOLHDL, LDLDIRECT in the last 72 hours. Thyroid Function Tests: No results for input(s): TSH, T4TOTAL, FREET4, T3FREE, THYROIDAB in the last 72 hours. Anemia Panel: No results for input(s): VITAMINB12, FOLATE, FERRITIN, TIBC, IRON, RETICCTPCT in the last 72 hours. Urine analysis:    Component Value Date/Time   COLORURINE YELLOW 06/11/2015 0515   APPEARANCEUR CLOUDY* 06/11/2015 0515   LABSPEC 1.023 06/11/2015 0515   LABSPEC 1.025 05/01/2010 1454   PHURINE 5.0 06/11/2015 0515   PHURINE 5.0 05/01/2010 1454   GLUCOSEU NEGATIVE 06/11/2015 0515   HGBUR LARGE* 06/11/2015 0515   HGBUR Negative 05/01/2010 1454   BILIRUBINUR NEGATIVE 06/11/2015 0515   BILIRUBINUR Negative 05/01/2010 1454   KETONESUR NEGATIVE 06/11/2015 0515   KETONESUR Negative 05/01/2010 1454   PROTEINUR NEGATIVE 06/11/2015 0515   PROTEINUR Negative 05/01/2010 1454   UROBILINOGEN 0.2 06/07/2014 2018   NITRITE POSITIVE* 06/11/2015 0515   NITRITE Negative 05/01/2010 1454   LEUKOCYTESUR MODERATE* 06/11/2015 0515   LEUKOCYTESUR Negative 05/01/2010 1454   Sepsis Labs: @LABRCNTIP (procalcitonin:4,lacticidven:4)  ) Recent Results (from the past 240 hour(s))  Blood Culture (routine x 2)     Status: Abnormal   Collection Time: 06/11/15  1:45 AM  Result Value Ref Range Status   Specimen Description BLOOD LEFT FOREARM  Final   Special Requests BOTTLES DRAWN AEROBIC AND ANAEROBIC 5CC  Final   Culture  Setup Time   Final    GRAM  NEGATIVE RODS IN BOTH AEROBIC AND ANAEROBIC BOTTLES CRITICAL RESULT CALLED TO, READ BACK BY AND VERIFIED WITH: A MEYER PHARMD [id number] 06/11/15 A BROWNING    Culture (A)  Final    ESCHERICHIA COLI SUSCEPTIBILITIES PERFORMED ON PREVIOUS CULTURE WITHIN THE LAST 5 DAYS.    Report Status 06/13/2015 FINAL  Final  Blood Culture (routine x 2)     Status: Abnormal   Collection Time: 06/11/15  1:50 AM  Result Value Ref Range Status   Specimen Description BLOOD RIGHT FOREARM  Final   Special Requests BOTTLES DRAWN AEROBIC AND ANAEROBIC 5CC   Final   Culture  Setup  Time   Final    GRAM NEGATIVE RODS IN BOTH AEROBIC AND ANAEROBIC BOTTLES Organism ID to follow CRITICAL RESULT CALLED TO, READ BACK BY AND VERIFIED WITH: A MEYER PHARMD [id number] 06/11/15 A BROWNING    Culture ESCHERICHIA COLI (A)  Final   Report Status 06/13/2015 FINAL  Final   Organism ID, Bacteria ESCHERICHIA COLI  Final      Susceptibility   Escherichia coli - MIC*    AMPICILLIN <=2 SENSITIVE Sensitive     CEFAZOLIN <=4 SENSITIVE Sensitive     CEFEPIME <=1 SENSITIVE Sensitive     CEFTAZIDIME <=1 SENSITIVE Sensitive     CEFTRIAXONE <=1 SENSITIVE Sensitive     CIPROFLOXACIN <=0.25 SENSITIVE Sensitive     GENTAMICIN <=1 SENSITIVE Sensitive     IMIPENEM <=0.25 SENSITIVE Sensitive     TRIMETH/SULFA <=20 SENSITIVE Sensitive     AMPICILLIN/SULBACTAM <=2 SENSITIVE Sensitive     PIP/TAZO <=4 SENSITIVE Sensitive     * ESCHERICHIA COLI  Blood Culture ID Panel (Reflexed)     Status: Abnormal   Collection Time: 06/11/15  1:50 AM  Result Value Ref Range Status   Enterococcus species NOT DETECTED NOT DETECTED Final   Vancomycin resistance NOT DETECTED NOT DETECTED Final   Listeria monocytogenes NOT DETECTED NOT DETECTED Final   Staphylococcus species NOT DETECTED NOT DETECTED Final   Staphylococcus aureus NOT DETECTED NOT DETECTED Final   Methicillin resistance NOT DETECTED NOT DETECTED Final   Streptococcus species NOT DETECTED NOT DETECTED  Final   Streptococcus agalactiae NOT DETECTED NOT DETECTED Final   Streptococcus pneumoniae NOT DETECTED NOT DETECTED Final   Streptococcus pyogenes NOT DETECTED NOT DETECTED Final   Acinetobacter baumannii NOT DETECTED NOT DETECTED Final   Enterobacteriaceae species NOT DETECTED NOT DETECTED Final   Enterobacter cloacae complex NOT DETECTED NOT DETECTED Final   Escherichia coli DETECTED (A) NOT DETECTED Final    Comment: CRITICAL RESULT CALLED TO, READ BACK BY AND VERIFIED WITH: A MEYER PHARMD 1737 06/11/15 A BROWNING    Klebsiella oxytoca NOT DETECTED NOT DETECTED Final   Klebsiella pneumoniae NOT DETECTED NOT DETECTED Final   Proteus species NOT DETECTED NOT DETECTED Final   Serratia marcescens NOT DETECTED NOT DETECTED Final   Carbapenem resistance NOT DETECTED NOT DETECTED Final   Haemophilus influenzae NOT DETECTED NOT DETECTED Final   Neisseria meningitidis NOT DETECTED NOT DETECTED Final   Pseudomonas aeruginosa NOT DETECTED NOT DETECTED Final   Candida albicans NOT DETECTED NOT DETECTED Final   Candida glabrata NOT DETECTED NOT DETECTED Final   Candida krusei NOT DETECTED NOT DETECTED Final   Candida parapsilosis NOT DETECTED NOT DETECTED Final   Candida tropicalis NOT DETECTED NOT DETECTED Final  Urine culture     Status: Abnormal   Collection Time: 06/11/15  5:16 AM  Result Value Ref Range Status   Specimen Description URINE, CATHETERIZED  Final   Special Requests NONE  Final   Culture 70,000 COLONIES/mL ESCHERICHIA COLI (A)  Final   Report Status 06/13/2015 FINAL  Final   Organism ID, Bacteria ESCHERICHIA COLI (A)  Final      Susceptibility   Escherichia coli - MIC*    AMPICILLIN <=2 SENSITIVE Sensitive     CEFAZOLIN <=4 SENSITIVE Sensitive     CEFTRIAXONE <=1 SENSITIVE Sensitive     CIPROFLOXACIN <=0.25 SENSITIVE Sensitive     GENTAMICIN <=1 SENSITIVE Sensitive     IMIPENEM <=0.25 SENSITIVE Sensitive     NITROFURANTOIN <=16 SENSITIVE Sensitive     TRIMETH/SULFA  <=  20 SENSITIVE Sensitive     AMPICILLIN/SULBACTAM <=2 SENSITIVE Sensitive     PIP/TAZO <=4 SENSITIVE Sensitive     * 70,000 COLONIES/mL ESCHERICHIA COLI  MRSA PCR Screening     Status: None   Collection Time: 06/11/15  7:46 AM  Result Value Ref Range Status   MRSA by PCR NEGATIVE NEGATIVE Final    Comment:        The GeneXpert MRSA Assay (FDA approved for NASAL specimens only), is one component of a comprehensive MRSA colonization surveillance program. It is not intended to diagnose MRSA infection nor to guide or monitor treatment for MRSA infections.   Culture, sputum-assessment     Status: None   Collection Time: 06/12/15  8:20 AM  Result Value Ref Range Status   Specimen Description SPUTUM  Final   Special Requests NONE  Final   Sputum evaluation   Final    THIS SPECIMEN IS ACCEPTABLE. RESPIRATORY CULTURE REPORT TO FOLLOW.   Report Status 06/12/2015 FINAL  Final  Culture, respiratory (NON-Expectorated)     Status: None   Collection Time: 06/12/15  8:20 AM  Result Value Ref Range Status   Specimen Description SPUTUM  Final   Special Requests NONE  Final   Gram Stain   Final    MODERATE WBC PRESENT,BOTH PMN AND MONONUCLEAR RARE SQUAMOUS EPITHELIAL CELLS PRESENT NO ORGANISMS SEEN THIS SPECIMEN IS ACCEPTABLE FOR SPUTUM CULTURE Performed at [facility]    Culture   Final    NORMAL OROPHARYNGEAL FLORA Performed at [facility]    Report Status 06/15/2015 FINAL  Final         Radiology Studies: No results found.      Scheduled Meds: . aspirin EC  81 mg Oral QHS  . budesonide  0.5 mg Nebulization BID  . cefTRIAXone (ROCEPHIN)  IV  2 g Intravenous Q24H  . clopidogrel  75 mg Oral Daily  . enoxaparin (LOVENOX) injection  40 mg Subcutaneous Q24H  . feeding supplement (ENSURE ENLIVE)  237 mL Oral TID BM  . guaiFENesin  600 mg Oral BID  . ipratropium-albuterol  3 mL Nebulization QID  . methylPREDNISolone (SOLU-MEDROL) injection  60 mg  Intravenous Daily  . pantoprazole  40 mg Oral BID  . polyethylene glycol  17 g Oral Daily  . rosuvastatin  5 mg Oral Daily  . senna-docusate  1 tablet Oral BID  . sodium chloride flush  3 mL Intravenous Q12H  . vitamin B-12  5,000 mcg Oral Daily   Continuous Infusions: . sodium chloride Stopped (06/13/15 2300)     LOS: 5 days    Time spent: 40 minutes    Kashmir Leedy, Geraldo Docker, MD [facility] Pager [phone]   If 7PM-7AM, please contact night-coverage [email or web address] Password [email or web address] 06/16/2015, 9:06 AM

## 2015-06-16 NOTE — Progress Notes (Signed)
SATURATION QUALIFICATIONS: (This note is used to comply with regulatory documentation for home oxygen)  Patient Saturations on Room Air at Rest = 94%  Patient Saturations on Room Air while Ambulating = 82%  Patient Saturations on 3 Liters of oxygen while Ambulating = 94%  Please briefly explain why patient needs home oxygen: Patient will drop his saturations while ambulating

## 2015-06-16 NOTE — Progress Notes (Signed)
Attempted to obtain consent for the unit of blood ordered, Pt states " I don't feel like I need it, I'm feeling fine" education given but patient continues to decline blood product. Dr. Sherral Hammers notified. Will continue to monitor

## 2015-06-17 DIAGNOSIS — N10 Acute tubulo-interstitial nephritis: Secondary | ICD-10-CM | POA: Diagnosis present

## 2015-06-17 DIAGNOSIS — J9620 Acute and chronic respiratory failure, unspecified whether with hypoxia or hypercapnia: Secondary | ICD-10-CM | POA: Diagnosis present

## 2015-06-17 LAB — COMPREHENSIVE METABOLIC PANEL
ALK PHOS: 32 U/L — AB (ref 38–126)
ALT: 30 U/L (ref 17–63)
AST: 14 U/L — AB (ref 15–41)
Albumin: 2.6 g/dL — ABNORMAL LOW (ref 3.5–5.0)
Anion gap: 8 (ref 5–15)
BILIRUBIN TOTAL: 0.7 mg/dL (ref 0.3–1.2)
BUN: 35 mg/dL — AB (ref 6–20)
CALCIUM: 8.5 mg/dL — AB (ref 8.9–10.3)
CHLORIDE: 101 mmol/L (ref 101–111)
CO2: 32 mmol/L (ref 22–32)
CREATININE: 1.28 mg/dL — AB (ref 0.61–1.24)
GFR calc Af Amer: 56 mL/min — ABNORMAL LOW (ref >60)
GFR, EST NON AFRICAN AMERICAN: 48 mL/min — AB (ref >60)
Glucose, Bld: 102 mg/dL — ABNORMAL HIGH (ref 65–99)
Potassium: 4.6 mmol/L (ref 3.5–5.1)
Sodium: 141 mmol/L (ref 135–145)
Total Protein: 5.4 g/dL — ABNORMAL LOW (ref 6.5–8.1)

## 2015-06-17 LAB — CBC WITH DIFFERENTIAL/PLATELET
BASOS PCT: 0 %
Basophils Absolute: 0 10*3/uL (ref 0.0–0.1)
EOS PCT: 1 %
Eosinophils Absolute: 0.1 10*3/uL (ref 0.0–0.7)
HEMATOCRIT: 29 % — AB (ref 39.0–52.0)
HEMOGLOBIN: 9.1 g/dL — AB (ref 13.0–17.0)
LYMPHS PCT: 14 %
Lymphs Abs: 0.7 10*3/uL (ref 0.7–4.0)
MCH: 21.7 pg — ABNORMAL LOW (ref 26.0–34.0)
MCHC: 31.4 g/dL (ref 30.0–36.0)
MCV: 69 fL — AB (ref 78.0–100.0)
Monocytes Absolute: 0.3 10*3/uL (ref 0.1–1.0)
Monocytes Relative: 5 %
NEUTROS ABS: 4.2 10*3/uL (ref 1.7–7.7)
NEUTROS PCT: 80 %
Platelets: 72 10*3/uL — ABNORMAL LOW (ref 150–400)
RBC: 4.2 MIL/uL — ABNORMAL LOW (ref 4.22–5.81)
RDW: 18.1 % — ABNORMAL HIGH (ref 11.5–15.5)
WBC: 5.3 10*3/uL (ref 4.0–10.5)

## 2015-06-17 LAB — TYPE AND SCREEN
ABO/RH(D): A POS
Antibody Screen: NEGATIVE
UNIT DIVISION: 0

## 2015-06-17 LAB — MAGNESIUM: MAGNESIUM: 2 mg/dL (ref 1.7–2.4)

## 2015-06-17 MED ORDER — SIMETHICONE 80 MG PO CHEW
80.0000 mg | CHEWABLE_TABLET | Freq: Once | ORAL | Status: AC
  Administered 2015-06-17: 80 mg via ORAL
  Filled 2015-06-17: qty 1

## 2015-06-17 MED ORDER — CEFUROXIME AXETIL 500 MG PO TABS
500.0000 mg | ORAL_TABLET | Freq: Two times a day (BID) | ORAL | Status: DC
  Administered 2015-06-17 – 2015-06-20 (×7): 500 mg via ORAL
  Filled 2015-06-17 (×8): qty 1

## 2015-06-17 MED ORDER — CEFIXIME 400 MG PO CAPS
400.0000 mg | ORAL_CAPSULE | Freq: Every day | ORAL | Status: DC
  Filled 2015-06-17: qty 1

## 2015-06-17 NOTE — Progress Notes (Signed)
Patient refused Chest vest at this time. Encouraged flutter and attempted to educate.

## 2015-06-17 NOTE — Progress Notes (Signed)
PROGRESS NOTE    [name] Azure, MD  VT:[id number] DOB: [birth year] DOA: 06/11/2015 PCP: [name], MD   Brief Narrative:  82 y.o. WM ([place]) PMHx COPD on home O2 (2.5 L at night), CAD s/p PCI 3 in 2011, cardiac arrest and VF s/p ICD, chronic systolic CHF last EF [id number]   Presented with dyspnea, fever, and yellow sputum. The patient started to develop cough productive of yellow sputum about a week prior accompanied by dyspnea. He saw his Cardiologist who noted fluid overload, and prescribed Lasix for three days. There was no fever and a CXR was negative at that time. Symptoms persisted and he went to his PCP four days prior who prescribed prednisone, Abx, and increased albuterol. The night of his admission the patient woke with chills, sweats, worsening cough, and he couldn't breathe.   In the ED the pt was in respiratory distress, and febrile to 104.96F rectally. CXR without opacity. Lactate 2.95.   Assessment & Plan:   Principal Problem:   Sepsis ([place]) Active Problems:   Anemia of chronic disease   Chronic kidney disease, stage 3   Automatic implantable cardioverter-defibrillator in situ   Chronic systolic CHF (congestive heart failure) (HCC)   Acute respiratory failure (HCC)   COPD exacerbation (HCC)   Severe protein-calorie malnutrition (HCC)   Sepsis due to Escherichia coli (E. coli) (HCC)   Bacteremia, escherichia coli   Pyelonephritis due to Escherichia coli   Dysphagia   Cardiomyopathy, ischemic   CKD (chronic kidney disease), stage III   Sepsis due to pneumonia ([place])   Hyperkalemia   CAP (community acquired pneumonia)   Microcytic anemia   Acute on chronic respiratory failure with hypoxia (HCC)   Acute pyelonephritis   Sepsis/ positive E coli bacteremia due to positive E coli Pyelonephritis -Lactic acidosis resolved -DuoNeb QID -Pulmicort BID -Solu-Medrol 60 mg daily -Flutter valve -Physiotherapy vest BID -Will require 2 weeks antibiotics; on 5/21  transitioned to PO; -Repeat blood culture 5/19 NGTD  COPD exacerbation/CAP/ Acute on chronic hypoxic respiratory failure  -On home 2.5 L O2 at night  -Flu negative  -See sepsis -Continued copious amounts of thick yellow sputum (significantly decreased from time of admission) -Ambulatory SPO2 5/20  Primary Esophageal Dysphagia -After patient's respiratory status improved will consult GI prior to discharge possible stricture?  CAD s/p PCI2011 -Asymptomatic   Chronic systolic CHF- Ischemic Cardiomyopathy - AICD EF 55-60% w/ grade 1 DD via TTE 06/06/15 -Daily weight Filed Weights   06/15/15 0421 06/16/15 0437 06/17/15 0331  Weight: 83.598 kg (184 lb 4.8 oz) 81.965 kg (180 lb 11.2 oz) 84.5 kg (186 lb 4.6 oz)  -Strict in and out since admission -4.1 L -Transfuse for hemoglobin<8   Recent Labs Lab 06/13/15 0501 06/14/15 0510 06/15/15 0517 06/16/15 0426 06/17/15 0235  HGB 8.0* 8.0* 7.8* 8.0* 9.1*  -On 5/20 contact CHF clinic/PCC M and request cardiopulmonary rehabilitation on discharge  -5/20 transfuse 1 unit PRBC   Mild AoS -Per TTE 06/06/15 -See CHF  CKD stage III  Lab Results  Component Value Date   CREATININE 1.28* 06/17/2015   CREATININE 1.22 06/16/2015   CREATININE 1.39* 06/15/2015   Chronic microcytic anemia(baseline Hgb appears to be ~9 ) -B thalassemia and chronic disease   Recent Labs Lab 06/11/15 0145 06/11/15 0741 06/12/15 0432 06/13/15 0501  HGB 9.1* 7.7* 7.5* 8.0*  -Occult blood card 3  Hyperkalemia -Resolved  GERD -Pepcid 20 mg PRN  -Protonix 40 mg BID      Rimantadine DVT prophylaxis:Lovenox Code  Status: FULL - pt clear he would desire "short term" vent support but not "long term"  Family Communication: Daughter at bedside Disposition Plan: Resolution sepsis   Consultants:  NA  Procedures/Significant Events:  5/10 echocardiogram;Left ventricle: mild LVH. -LVEF= 55% to 60%. -(grade 1 diastolic dysfunction).  5/16 barium  swallow; primary esophageal dysphagia. Incomplete clearance barium through his esophagus. -Moderate aspiration risk 5/20 Transfuse 1 unit PRBC   Cultures 5/15 blood right forearm 2 positive Escherichia coli 5/15 urine positive Escherichia coli 5/15 influenza panel negative  5/16 sputum Normal flora 5/19 blood right hand/left arm NGTD    Antimicrobials: Zosyn 5/15 1 dose Vanc 5/15 1 dose Azithromycin 5/151 dose Rocephin 5/15 >> 5/21 Cefixime 5/21>>   Devices    LINES / TUBES:      Continuous Infusions: . sodium chloride Stopped (06/13/15 2300)     Subjective: 5/21 A/O 4, states respiratory status improved. C/O gas     Objective: Filed Vitals:   06/17/15 1300 06/17/15 1400 06/17/15 1500 06/17/15 1540  BP:   152/92   Pulse: 75 69 133   Temp:    97.4 F (36.3 C)  TempSrc:    Oral  Resp: 23 22 23    Height:      Weight:      SpO2: 96% 99% 90% 97%    Intake/Output Summary (Last 24 hours) at 06/17/15 1727 Last data filed at 06/17/15 1500  Gross per 24 hour  Intake      3 ml  Output   2801 ml  Net  -2798 ml   Filed Weights   06/15/15 0421 06/16/15 0437 06/17/15 0331  Weight: 83.598 kg (184 lb 4.8 oz) 81.965 kg (180 lb 11.2 oz) 84.5 kg (186 lb 4.6 oz)    Examination:  General: A/O 4, positive acute on chronic respiratory distress but improved. Eyes: negative scleral hemorrhage, negative anisocoria, negative icterus ENT: Negative Runny nose, negative gingival bleeding, Neck:  Negative scars, masses, torticollis, lymphadenopathy, JVD Lungs:  positive Mild rhonchi (most likely baseline), with positive productive cough (yellow ), positive mild expiratory wheezes Rt>>Lt, negative crackles  Cardiovascular: Regular rate and rhythm without murmur gallop or rub normal S1 and S2 Abdomen: negative abdominal pain, nondistended, positive soft, bowel sounds, no rebound, no ascites, no appreciable mass Extremities: No significant cyanosis, clubbing, or edema  bilateral lower extremities Skin: Negative rashes, lesions, ulcers Psychiatric:  Negative depression, negative anxiety, negative fatigue, negative mania Central nervous system:  Cranial nerves II through XII intact, tongue/uvula midline, all extremities muscle strength 5/5, sensation intact throughout, finger nose finger bilateral within normal limits, quick finger touch bilateral within normal limits,  negative dysarthria, negative expressive aphasia, negative receptive aphasia.  .     Data Reviewed: Care during the described time interval was provided by me .  I have reviewed this patient's available data, including medical history, events of note, physical examination, and all test results as part of my evaluation. I have personally reviewed and interpreted all radiology studies.  CBC:  Recent Labs Lab 06/11/15 0145  06/13/15 0501 06/14/15 0510 06/15/15 0517 06/16/15 0426 06/17/15 0235  WBC 4.6  < > 7.2 3.9* 5.2 4.6 5.3  NEUTROABS 4.3  --   --  3.6 4.1 3.7 4.2  HGB 9.1*  < > 8.0* 8.0* 7.8* 8.0* 9.1*  HCT 29.2*  < > 25.7* 25.5* 25.5* 25.8* 29.0*  MCV 67.9*  < > 68.2* 67.5* 68.5* 67.5* 69.0*  PLT 98*  < > 90* 82* 77* 68* 72*  < > =  values in this interval not displayed. Basic Metabolic Panel:  Recent Labs Lab 06/13/15 0501 06/14/15 0510 06/15/15 0517 06/16/15 0426 06/17/15 0235  NA 143 143 145 144 141  K 5.0 5.3* 4.7 4.7 4.6  CL 111 108 107 104 101  CO2 20* 25 28 31  32  GLUCOSE 183* 179* 97 122* 102*  BUN 54* 47* 47* 39* 35*  CREATININE 1.60* 1.42* 1.39* 1.22 1.28*  CALCIUM 8.1* 8.4* 8.4* 8.5* 8.5*  MG  --  2.6* 2.3 2.1 2.0   GFR: Estimated Creatinine Clearance: 42.5 mL/min (by C-G formula based on Cr of 1.28). Liver Function Tests:  Recent Labs Lab 06/13/15 0501 06/14/15 0510 06/15/15 0517 06/16/15 0426 06/17/15 0235  AST 29 23 23 17  14*  ALT 47 43 39 33 30  ALKPHOS 42 35* 34* 32* 32*  BILITOT 0.5 0.3 0.5 0.5 0.7  PROT 5.6* 5.5* 5.5* 5.4* 5.4*  ALBUMIN  2.8* 2.6* 2.7* 2.6* 2.6*   No results for input(s): LIPASE, AMYLASE in the last 168 hours. No results for input(s): AMMONIA in the last 168 hours. Coagulation Profile: No results for input(s): INR, PROTIME in the last 168 hours. Cardiac Enzymes: No results for input(s): CKTOTAL, CKMB, CKMBINDEX, TROPONINI in the last 168 hours. BNP (last 3 results) No results for input(s): PROBNP in the last 8760 hours. HbA1C: No results for input(s): HGBA1C in the last 72 hours. CBG: No results for input(s): GLUCAP in the last 168 hours. Lipid Profile: No results for input(s): CHOL, HDL, LDLCALC, TRIG, CHOLHDL, LDLDIRECT in the last 72 hours. Thyroid Function Tests: No results for input(s): TSH, T4TOTAL, FREET4, T3FREE, THYROIDAB in the last 72 hours. Anemia Panel: No results for input(s): VITAMINB12, FOLATE, FERRITIN, TIBC, IRON, RETICCTPCT in the last 72 hours. Urine analysis:    Component Value Date/Time   COLORURINE YELLOW 06/11/2015 0515   APPEARANCEUR CLOUDY* 06/11/2015 0515   LABSPEC 1.023 06/11/2015 0515   LABSPEC 1.025 05/01/2010 1454   PHURINE 5.0 06/11/2015 0515   PHURINE 5.0 05/01/2010 1454   GLUCOSEU NEGATIVE 06/11/2015 0515   HGBUR LARGE* 06/11/2015 0515   HGBUR Negative 05/01/2010 1454   BILIRUBINUR NEGATIVE 06/11/2015 0515   BILIRUBINUR Negative 05/01/2010 1454   KETONESUR NEGATIVE 06/11/2015 0515   KETONESUR Negative 05/01/2010 1454   PROTEINUR NEGATIVE 06/11/2015 0515   PROTEINUR Negative 05/01/2010 1454   UROBILINOGEN 0.2 06/07/2014 2018   NITRITE POSITIVE* 06/11/2015 0515   NITRITE Negative 05/01/2010 1454   LEUKOCYTESUR MODERATE* 06/11/2015 0515   LEUKOCYTESUR Negative 05/01/2010 1454   Sepsis Labs: @LABRCNTIP (procalcitonin:4,lacticidven:4)  ) Recent Results (from the past 240 hour(s))  Blood Culture (routine x 2)     Status: Abnormal   Collection Time: 06/11/15  1:45 AM  Result Value Ref Range Status   Specimen Description BLOOD LEFT FOREARM  Final   Special  Requests BOTTLES DRAWN AEROBIC AND ANAEROBIC 5CC  Final   Culture  Setup Time   Final    GRAM NEGATIVE RODS IN BOTH AEROBIC AND ANAEROBIC BOTTLES CRITICAL RESULT CALLED TO, READ BACK BY AND VERIFIED WITH: A MEYER PHARMD [id number] 06/11/15 A BROWNING    Culture (A)  Final    ESCHERICHIA COLI SUSCEPTIBILITIES PERFORMED ON PREVIOUS CULTURE WITHIN THE LAST 5 DAYS.    Report Status 06/13/2015 FINAL  Final  Blood Culture (routine x 2)     Status: Abnormal   Collection Time: 06/11/15  1:50 AM  Result Value Ref Range Status   Specimen Description BLOOD RIGHT FOREARM  Final   Special Requests  BOTTLES DRAWN AEROBIC AND ANAEROBIC 5CC   Final   Culture  Setup Time   Final    GRAM NEGATIVE RODS IN BOTH AEROBIC AND ANAEROBIC BOTTLES Organism ID to follow CRITICAL RESULT CALLED TO, READ BACK BY AND VERIFIED WITH: A MEYER PHARMD [id number] 06/11/15 A BROWNING    Culture ESCHERICHIA COLI (A)  Final   Report Status 06/13/2015 FINAL  Final   Organism ID, Bacteria ESCHERICHIA COLI  Final      Susceptibility   Escherichia coli - MIC*    AMPICILLIN <=2 SENSITIVE Sensitive     CEFAZOLIN <=4 SENSITIVE Sensitive     CEFEPIME <=1 SENSITIVE Sensitive     CEFTAZIDIME <=1 SENSITIVE Sensitive     CEFTRIAXONE <=1 SENSITIVE Sensitive     CIPROFLOXACIN <=0.25 SENSITIVE Sensitive     GENTAMICIN <=1 SENSITIVE Sensitive     IMIPENEM <=0.25 SENSITIVE Sensitive     TRIMETH/SULFA <=20 SENSITIVE Sensitive     AMPICILLIN/SULBACTAM <=2 SENSITIVE Sensitive     PIP/TAZO <=4 SENSITIVE Sensitive     * ESCHERICHIA COLI  Blood Culture ID Panel (Reflexed)     Status: Abnormal   Collection Time: 06/11/15  1:50 AM  Result Value Ref Range Status   Enterococcus species NOT DETECTED NOT DETECTED Final   Vancomycin resistance NOT DETECTED NOT DETECTED Final   Listeria monocytogenes NOT DETECTED NOT DETECTED Final   Staphylococcus species NOT DETECTED NOT DETECTED Final   Staphylococcus aureus NOT DETECTED NOT DETECTED Final    Methicillin resistance NOT DETECTED NOT DETECTED Final   Streptococcus species NOT DETECTED NOT DETECTED Final   Streptococcus agalactiae NOT DETECTED NOT DETECTED Final   Streptococcus pneumoniae NOT DETECTED NOT DETECTED Final   Streptococcus pyogenes NOT DETECTED NOT DETECTED Final   Acinetobacter baumannii NOT DETECTED NOT DETECTED Final   Enterobacteriaceae species NOT DETECTED NOT DETECTED Final   Enterobacter cloacae complex NOT DETECTED NOT DETECTED Final   Escherichia coli DETECTED (A) NOT DETECTED Final    Comment: CRITICAL RESULT CALLED TO, READ BACK BY AND VERIFIED WITH: A MEYER PHARMD 1737 06/11/15 A BROWNING    Klebsiella oxytoca NOT DETECTED NOT DETECTED Final   Klebsiella pneumoniae NOT DETECTED NOT DETECTED Final   Proteus species NOT DETECTED NOT DETECTED Final   Serratia marcescens NOT DETECTED NOT DETECTED Final   Carbapenem resistance NOT DETECTED NOT DETECTED Final   Haemophilus influenzae NOT DETECTED NOT DETECTED Final   Neisseria meningitidis NOT DETECTED NOT DETECTED Final   Pseudomonas aeruginosa NOT DETECTED NOT DETECTED Final   Candida albicans NOT DETECTED NOT DETECTED Final   Candida glabrata NOT DETECTED NOT DETECTED Final   Candida krusei NOT DETECTED NOT DETECTED Final   Candida parapsilosis NOT DETECTED NOT DETECTED Final   Candida tropicalis NOT DETECTED NOT DETECTED Final  Urine culture     Status: Abnormal   Collection Time: 06/11/15  5:16 AM  Result Value Ref Range Status   Specimen Description URINE, CATHETERIZED  Final   Special Requests NONE  Final   Culture 70,000 COLONIES/mL ESCHERICHIA COLI (A)  Final   Report Status 06/13/2015 FINAL  Final   Organism ID, Bacteria ESCHERICHIA COLI (A)  Final      Susceptibility   Escherichia coli - MIC*    AMPICILLIN <=2 SENSITIVE Sensitive     CEFAZOLIN <=4 SENSITIVE Sensitive     CEFTRIAXONE <=1 SENSITIVE Sensitive     CIPROFLOXACIN <=0.25 SENSITIVE Sensitive     GENTAMICIN <=1 SENSITIVE Sensitive       IMIPENEM <=  0.25 SENSITIVE Sensitive     NITROFURANTOIN <=16 SENSITIVE Sensitive     TRIMETH/SULFA <=20 SENSITIVE Sensitive     AMPICILLIN/SULBACTAM <=2 SENSITIVE Sensitive     PIP/TAZO <=4 SENSITIVE Sensitive     * 70,000 COLONIES/mL ESCHERICHIA COLI  MRSA PCR Screening     Status: None   Collection Time: 06/11/15  7:46 AM  Result Value Ref Range Status   MRSA by PCR NEGATIVE NEGATIVE Final    Comment:        The GeneXpert MRSA Assay (FDA approved for NASAL specimens only), is one component of a comprehensive MRSA colonization surveillance program. It is not intended to diagnose MRSA infection nor to guide or monitor treatment for MRSA infections.   Culture, sputum-assessment     Status: None   Collection Time: 06/12/15  8:20 AM  Result Value Ref Range Status   Specimen Description SPUTUM  Final   Special Requests NONE  Final   Sputum evaluation   Final    THIS SPECIMEN IS ACCEPTABLE. RESPIRATORY CULTURE REPORT TO FOLLOW.   Report Status 06/12/2015 FINAL  Final  Culture, respiratory (NON-Expectorated)     Status: None   Collection Time: 06/12/15  8:20 AM  Result Value Ref Range Status   Specimen Description SPUTUM  Final   Special Requests NONE  Final   Gram Stain   Final    MODERATE WBC PRESENT,BOTH PMN AND MONONUCLEAR RARE SQUAMOUS EPITHELIAL CELLS PRESENT NO ORGANISMS SEEN THIS SPECIMEN IS ACCEPTABLE FOR SPUTUM CULTURE Performed at [facility]    Culture   Final    NORMAL OROPHARYNGEAL FLORA Performed at [facility]    Report Status 06/15/2015 FINAL  Final  Culture, blood (routine x 2)     Status: None (Preliminary result)   Collection Time: 06/15/15  4:35 AM  Result Value Ref Range Status   Specimen Description BLOOD RIGHT HAND  Final   Special Requests IN PEDIATRIC BOTTLE 3CC  Final   Culture NO GROWTH 2 DAYS  Final   Report Status PENDING  Incomplete  Culture, blood (routine x 2)     Status: None (Preliminary result)   Collection Time:  06/15/15  4:41 AM  Result Value Ref Range Status   Specimen Description BLOOD LEFT ARM  Final   Special Requests IN PEDIATRIC BOTTLE 3CC  Final   Culture NO GROWTH 2 DAYS  Final   Report Status PENDING  Incomplete         Radiology Studies: No results found.      Scheduled Meds: . sodium chloride   Intravenous Once  . aspirin EC  81 mg Oral QHS  . budesonide  0.5 mg Nebulization BID  . cefUROXime  500 mg Oral BID WC  . clopidogrel  75 mg Oral Daily  . enoxaparin (LOVENOX) injection  40 mg Subcutaneous Q24H  . feeding supplement (ENSURE ENLIVE)  237 mL Oral TID BM  . guaiFENesin  600 mg Oral BID  . ipratropium-albuterol  3 mL Nebulization QID  . methylPREDNISolone (SOLU-MEDROL) injection  60 mg Intravenous Daily  . pantoprazole  40 mg Oral BID  . polyethylene glycol  17 g Oral Daily  . rosuvastatin  5 mg Oral Daily  . senna-docusate  1 tablet Oral BID  . sodium chloride flush  3 mL Intravenous Q12H  . vitamin B-12  5,000 mcg Oral Daily   Continuous Infusions: . sodium chloride Stopped (06/13/15 2300)     LOS: 6 days    Time spent:  40 minutes    Rajiv Parlato, Geraldo Docker, MD [facility] Pager [phone]   If 7PM-7AM, please contact night-coverage [email or web address] Password [email or web address] 06/17/2015, 5:27 PM

## 2015-06-18 DIAGNOSIS — N10 Acute tubulo-interstitial nephritis: Secondary | ICD-10-CM

## 2015-06-18 DIAGNOSIS — R7881 Bacteremia: Secondary | ICD-10-CM

## 2015-06-18 LAB — CBC WITH DIFFERENTIAL/PLATELET
BASOS ABS: 0 10*3/uL (ref 0.0–0.1)
Basophils Relative: 0 %
Eosinophils Absolute: 0.1 10*3/uL (ref 0.0–0.7)
Eosinophils Relative: 1 %
HCT: 27.9 % — ABNORMAL LOW (ref 39.0–52.0)
HEMOGLOBIN: 9.1 g/dL — AB (ref 13.0–17.0)
LYMPHS ABS: 0.8 10*3/uL (ref 0.7–4.0)
Lymphocytes Relative: 12 %
MCH: 22 pg — ABNORMAL LOW (ref 26.0–34.0)
MCHC: 32.6 g/dL (ref 30.0–36.0)
MCV: 67.6 fL — ABNORMAL LOW (ref 78.0–100.0)
Monocytes Absolute: 0.3 10*3/uL (ref 0.1–1.0)
Monocytes Relative: 4 %
NEUTROS PCT: 83 %
Neutro Abs: 5.4 10*3/uL (ref 1.7–7.7)
Platelets: 75 10*3/uL — ABNORMAL LOW (ref 150–400)
RBC: 4.13 MIL/uL — AB (ref 4.22–5.81)
RDW: 18.1 % — AB (ref 11.5–15.5)
WBC: 6.6 10*3/uL (ref 4.0–10.5)

## 2015-06-18 LAB — COMPREHENSIVE METABOLIC PANEL
ALBUMIN: 2.7 g/dL — AB (ref 3.5–5.0)
ALK PHOS: 31 U/L — AB (ref 38–126)
ALT: 30 U/L (ref 17–63)
ANION GAP: 7 (ref 5–15)
AST: 14 U/L — AB (ref 15–41)
BUN: 32 mg/dL — AB (ref 6–20)
CO2: 31 mmol/L (ref 22–32)
Calcium: 8.6 mg/dL — ABNORMAL LOW (ref 8.9–10.3)
Chloride: 103 mmol/L (ref 101–111)
Creatinine, Ser: 1.21 mg/dL (ref 0.61–1.24)
GFR calc Af Amer: 60 mL/min — ABNORMAL LOW (ref >60)
GFR calc non Af Amer: 52 mL/min — ABNORMAL LOW (ref >60)
GLUCOSE: 96 mg/dL (ref 65–99)
Potassium: 4.6 mmol/L (ref 3.5–5.1)
SODIUM: 141 mmol/L (ref 135–145)
Total Bilirubin: 0.6 mg/dL (ref 0.3–1.2)
Total Protein: 5.2 g/dL — ABNORMAL LOW (ref 6.5–8.1)

## 2015-06-18 LAB — MAGNESIUM: Magnesium: 2 mg/dL (ref 1.7–2.4)

## 2015-06-18 MED ORDER — LORAZEPAM 0.5 MG PO TABS
0.5000 mg | ORAL_TABLET | ORAL | Status: DC | PRN
  Administered 2015-06-18 – 2015-06-19 (×2): 0.5 mg via ORAL
  Filled 2015-06-18 (×2): qty 1

## 2015-06-18 MED ORDER — METOCLOPRAMIDE HCL 5 MG PO TABS
5.0000 mg | ORAL_TABLET | Freq: Three times a day (TID) | ORAL | Status: DC
  Administered 2015-06-18 – 2015-06-20 (×8): 5 mg via ORAL
  Filled 2015-06-18 (×8): qty 1

## 2015-06-18 MED ORDER — FUROSEMIDE 20 MG PO TABS
20.0000 mg | ORAL_TABLET | Freq: Every day | ORAL | Status: DC
  Administered 2015-06-18 – 2015-06-20 (×3): 20 mg via ORAL
  Filled 2015-06-18 (×3): qty 1

## 2015-06-18 NOTE — Progress Notes (Signed)
[place] TEAM 1 - Stepdown/ICU TEAM PROGRESS NOTE  [name], MD VT:[id number] DOB: July 06, 1925 DOA: 06/11/2015 PCP: [name], MD  Admit HPI / Brief Narrative: 82 y.o. male with a history of COPD on home O2, CAD s/p PCI 3 in 2011, cardiac arrest and VF s/p ICD, chronic systolic CHF last EF [id number] who presented with dyspnea, fever, and yellow sputum.  The patient started to develop cough productive of yellow sputum about a week prior accompanied by dyspnea. He saw his Cardiologist who noted fluid overload, and prescribed Lasix for three days. There was no fever and a CXR was negative at that time.  Symptoms persisted and he went to his PCP four days prior who prescribed prednisone, Abx, and increased albuterol. The night of his admission the patient woke with chills, sweats, worsening cough, and he couldn't breathe.   In the ED the pt was in respiratory distress, and febrile to 104.77F rectally.  CXR without opacity.  Lactate 2.95.  HPI/Subjective: Pt is c/o early satiety and some regurgitation when he attempts to eat a large portion.  He is able to consume liquids w/o difficulty, and a modest quantity of soft solids.  He denies cp, sob, or abdom pain at this time.    Assessment/Plan:  Sepsis w/ bacteremia due to E coli Pyelonephritis Clinically stabilized from this regard - to complete 14 day course of abx tx  - has been transitioned to oral meds   Acute bronchitis - Acute on chronic hypoxic respiratory failure  Flu negative - sats stable - pt has been refusing percussion vest tx - cont bronchial hygeine - episodic anxiety appears to be well controlled at this time    COPD exacerbation Acute exacerbation appears to be resolved at this time - stop steroids - cont maintenance nebs - follow   Primary Esophageal Dysphagia -pt is c/o sx - will give trial of reglan - will need outpt GI workup, but presently do not feel prolonging his inpt stay is indicated as he is able to consume  liquids and soft solids   CAD s/p PCI2011 Asymptomatic   Chronic systolic CHF- Ischemic Cardiomyopathy - AICD EF 55-60% w/ grade 1 DD via TTE 06/06/15 - appears to be euvolemic at this time - resume his usual home diuretic today  Filed Weights   06/15/15 0421 06/16/15 0437 06/17/15 0331  Weight: 83.598 kg (184 lb 4.8 oz) 81.965 kg (180 lb 11.2 oz) 84.5 kg (186 lb 4.6 oz)    Mild AoS Per TTE 06/06/15  CKD stage III  Renal fxn is stabe at present   Recent Labs Lab 06/14/15 0510 06/15/15 0517 06/16/15 0426 06/17/15 0235 06/18/15 0442  CREATININE 1.42* 1.39* 1.22 1.28* 1.21    Chronic microcytic anemia B thalassemia and chronic disease - baseline Hgb appears to be ~9 - Hgb presently stable at his baseline    Recent Labs Lab 06/14/15 0510 06/15/15 0517 06/16/15 0426 06/17/15 0235 06/18/15 0442  HGB 8.0* 7.8* 8.0* 9.1* 9.1*    PCM  Code Status: FULL - pt clear he would desire "short term" vent support but not "long term"  Family Communication: no family present at time of exam today  Disposition Plan: med surg bed - PT/OT suggest [facility] PT/OT - probable d/c ome 5/23 if stable over night and tolerated reglan   Consultants: none  Procedures: none  Antibiotics: Zosyn 5/14 Vanc 5/14 Rocephin 5/15 > 5/21 Ceftin 5/21 >  DVT prophylaxis: lovenox  Objective: Blood pressure 121/58, pulse 78, temperature  97.3 F (36.3 C), temperature source Oral, resp. rate 22, height 5\' 11"  (1.803 m), weight 84.5 kg (186 lb 4.6 oz), SpO2 90 %.  Intake/Output Summary (Last 24 hours) at 06/18/15 1119 Last data filed at 06/18/15 0847  Gross per 24 hour  Intake    240 ml  Output   1975 ml  Net  -1735 ml    Exam: General: alert and oriented - no acute distress  Lungs: no wheeze or focal crackles  Cardiovascular: Regular rate and rhythm without murmur gallop or rub  Abdomen: Nontender, nondistended, soft, bowel sounds positive, no rebound, no ascites, no appreciable  mass Extremities: No significant cyanosis, or clubbing - trace edema bilateral lower extremities   Data Reviewed: Basic Metabolic Panel:  Recent Labs Lab 06/14/15 0510 06/15/15 0517 06/16/15 0426 06/17/15 0235 06/18/15 0442  NA 143 145 144 141 141  K 5.3* 4.7 4.7 4.6 4.6  CL 108 107 104 101 103  CO2 25 28 31  32 31  GLUCOSE 179* 97 122* 102* 96  BUN 47* 47* 39* 35* 32*  CREATININE 1.42* 1.39* 1.22 1.28* 1.21  CALCIUM 8.4* 8.4* 8.5* 8.5* 8.6*  MG 2.6* 2.3 2.1 2.0 2.0    CBC:  Recent Labs Lab 06/14/15 0510 06/15/15 0517 06/16/15 0426 06/17/15 0235 06/18/15 0442  WBC 3.9* 5.2 4.6 5.3 6.6  NEUTROABS 3.6 4.1 3.7 4.2 5.4  HGB 8.0* 7.8* 8.0* 9.1* 9.1*  HCT 25.5* 25.5* 25.8* 29.0* 27.9*  MCV 67.5* 68.5* 67.5* 69.0* 67.6*  PLT 82* 77* 68* 72* 75*    Liver Function Tests:  Recent Labs Lab 06/14/15 0510 06/15/15 0517 06/16/15 0426 06/17/15 0235 06/18/15 0442  AST 23 23 17  14* 14*  ALT 43 39 33 30 30  ALKPHOS 35* 34* 32* 32* 31*  BILITOT 0.3 0.5 0.5 0.7 0.6  PROT 5.5* 5.5* 5.4* 5.4* 5.2*  ALBUMIN 2.6* 2.7* 2.6* 2.6* 2.7*   Studies:   Recent x-ray studies have been reviewed in detail by the Attending Physician  Scheduled Meds:  Scheduled Meds: . sodium chloride   Intravenous Once  . aspirin EC  81 mg Oral QHS  . budesonide  0.5 mg Nebulization BID  . cefUROXime  500 mg Oral BID WC  . clopidogrel  75 mg Oral Daily  . enoxaparin (LOVENOX) injection  40 mg Subcutaneous Q24H  . feeding supplement (ENSURE ENLIVE)  237 mL Oral TID BM  . guaiFENesin  600 mg Oral BID  . ipratropium-albuterol  3 mL Nebulization QID  . methylPREDNISolone (SOLU-MEDROL) injection  60 mg Intravenous Daily  . pantoprazole  40 mg Oral BID  . polyethylene glycol  17 g Oral Daily  . rosuvastatin  5 mg Oral Daily  . senna-docusate  1 tablet Oral BID  . sodium chloride flush  3 mL Intravenous Q12H  . vitamin B-12  5,000 mcg Oral Daily    Time spent on care of this patient: 25  mins   [facility] T , MD   [facility] Office  [phone] Pager - Text Page per Shea Evans as per below:  On-Call/Text Page:      [email or web address]      password [email or web address]  If 7PM-7AM, please contact night-coverage [email or web address] Password [email or web address] 06/18/2015, 11:19 AM   LOS: 7 days

## 2015-06-18 NOTE — Progress Notes (Signed)
Nutrition Follow-up  DOCUMENTATION CODES:   Not applicable  INTERVENTION:  Continue Ensure Enlive po TID, each supplement provides 350 kcal and 20 grams of protein.  Encourage adequate PO intake.   NUTRITION DIAGNOSIS:   Inadequate oral intake related to poor appetite as evidenced by per patient/family report; improving  GOAL:   Patient will meet greater than or equal to 90% of their needs; met  MONITOR:   Supplement acceptance, PO intake, Diet advancement, Labs, Weight trends, Skin, I & O's  REASON FOR ASSESSMENT:   Consult Assessment of nutrition requirement/status  ASSESSMENT:   Pt with a past medical history significant for COPD on home O2, CAD s/p PCI 3 in 2011, cardiac arrest and VF s/p ICD, chronic systolic CHF last EF 82% who presents with dyspnea, fever, and yellow sputum.  Meal completion has been varied from 10-80%. Pt complains of early satiety however says he is able to consume liquids with no other difficulties. Pt has been drinking his Ensure. RD to continue with current orders. Labs and medications reviewed.   Diet Order:  DIET - DYS 1 Room service appropriate?: Yes; Fluid consistency:: Thin  Skin:  Wound (see comment) (Skin tear L elbow)  Last BM:  5/21  Height:   Ht Readings from Last 1 Encounters:  06/16/15 _0  (1.803 m)    Weight:   Wt Readings from Last 1 Encounters:  06/17/15 186 lb 4.6 oz (84.5 kg)    Ideal Body Weight:  80.9 kg  BMI:  Body mass index is 25.99 kg/(m^2).  Estimated Nutritional Needs:   Kcal:  1800-2000  Protein:  85-100 grams  Fluid:  2 L  EDUCATION NEEDS:   No education needs identified at this time  Corrin Parker, MS, RD, LDN Pager # [phone] After hours/ weekend pager # [phone]

## 2015-06-18 NOTE — Progress Notes (Signed)
No ICM transmission received on 06/14/2015 due to patient currently hospitalized.  Transmission rescheduled to 06/22/2015 if patient is discharged.

## 2015-06-18 NOTE — Progress Notes (Signed)
Pt refused chest vest at this time.  Explained benefits to patient however, he does not want it.

## 2015-06-18 NOTE — Consult Note (Signed)
   [facility] CM Inpatient Consult   06/18/2015  [name] Azure, MD [birth year] [id number]   Patient screened for [place] Management program. Spoke with inpatient RNCM prior to bedside visit. Spoke with patient and his daughter in law at bedside to explain and offer [place] Management program. [name] [name] requests writer to contact his daughter, [name] [name] at [phone], to discuss [place] Management program. Call placed to patient's daughter, per his request, to explain [place] Management services. [name] [name] states she will think about it and call writer if she thinks her father needs [place] Management program services. Made her aware that contact information and [facility] Care Management brochure was left at [name]' bedside for her to call if needed. Made inpatient RNCM aware that [place] Management services at this time.  Marthenia Rolling, MSN-Ed, RN,BSN [facility] Liaison [phone]

## 2015-06-19 ENCOUNTER — Inpatient Hospital Stay (HOSPITAL_COMMUNITY): Payer: Medicare Other

## 2015-06-19 DIAGNOSIS — K2289 Other specified disease of esophagus: Secondary | ICD-10-CM | POA: Diagnosis present

## 2015-06-19 DIAGNOSIS — K228 Other specified diseases of esophagus: Secondary | ICD-10-CM | POA: Diagnosis present

## 2015-06-19 LAB — BASIC METABOLIC PANEL
Anion gap: 9 (ref 5–15)
BUN: 36 mg/dL — AB (ref 6–20)
CALCIUM: 8.6 mg/dL — AB (ref 8.9–10.3)
CO2: 31 mmol/L (ref 22–32)
CREATININE: 1.24 mg/dL (ref 0.61–1.24)
Chloride: 99 mmol/L — ABNORMAL LOW (ref 101–111)
GFR calc non Af Amer: 50 mL/min — ABNORMAL LOW (ref >60)
GFR, EST AFRICAN AMERICAN: 58 mL/min — AB (ref >60)
GLUCOSE: 89 mg/dL (ref 65–99)
Potassium: 4.7 mmol/L (ref 3.5–5.1)
Sodium: 139 mmol/L (ref 135–145)

## 2015-06-19 LAB — CBC
HEMATOCRIT: 31.2 % — AB (ref 39.0–52.0)
Hemoglobin: 9.8 g/dL — ABNORMAL LOW (ref 13.0–17.0)
MCH: 22 pg — AB (ref 26.0–34.0)
MCHC: 31.4 g/dL (ref 30.0–36.0)
MCV: 70.1 fL — AB (ref 78.0–100.0)
Platelets: 74 10*3/uL — ABNORMAL LOW (ref 150–400)
RBC: 4.45 MIL/uL (ref 4.22–5.81)
RDW: 18.4 % — AB (ref 11.5–15.5)
WBC: 7.1 10*3/uL (ref 4.0–10.5)

## 2015-06-19 MED ORDER — ALUM HYDROXIDE-MAG CARBONATE 95-358 MG/15ML PO SUSP
30.0000 mL | Freq: Three times a day (TID) | ORAL | Status: DC
  Administered 2015-06-20 (×3): 30 mL via ORAL
  Filled 2015-06-19 (×5): qty 30

## 2015-06-19 NOTE — Progress Notes (Signed)
SLP Cancellation Note  Patient Details Name: [name] Paille, MD MRN: [id number] DOB: [birth year]   Cancelled treatment:       Reason Eval/Treat Not Completed: Other (comment). Pt is apparently NPO for esophagram.    Zyasia Halbleib, Katherene Ponto 06/19/2015, 10:32 AM

## 2015-06-19 NOTE — Progress Notes (Signed)
Physical Therapy Treatment Patient Details Name: [name] Sauceman, MD MRN: [id number] DOB: 06/12/1925 Today's Date: 06/19/2015    History of Present Illness 82 y.o. male with a history of COPD on home O2, CAD s/p PCI 3 in 2011, cardiac arrest and VF s/p ICD, chronic systolic CHF last EF [id number] who presented with dyspnea, fever, and yellow sputum.    PT Comments    Patient progressing with independence with w/c management with smaller chair.  Also increased distance with propulsion.  Fatigued at end of session, but left up in chair with grandaughter in room and RN aware.  Feel continued skilled PT in the acute setting indicated to progress mobility for improved function when d/c home with follow up HHPT.   Follow Up Recommendations  Home health PT     Equipment Recommendations  None recommended by PT    Recommendations for Other Services       Precautions / Restrictions Precautions Precautions: Fall Precaution Comments: O2 dependent Restrictions Weight Bearing Restrictions: No    Mobility  Bed Mobility Overal bed mobility: Needs Assistance Bed Mobility: Supine to Sit     Supine to sit: Min guard     General bed mobility comments: assist for balance coming up to sit to prevent posterior LOB  Transfers Overall transfer level: Needs assistance Equipment used: None Transfers: Sit to/from Stand;Stand Pivot Transfers Sit to Stand: Min guard Stand pivot transfers: Min guard       General transfer comment: assist for safety/balance reaching to w/c and holding on while turning feet to get into chair  Ambulation/Gait                 Hotel manager mobility: Yes Wheelchair propulsion: Both upper extremities Wheelchair parts: Needs assistance Distance: 150 Wheelchair Assistance Details (indicate cue type and reason): min A for w/c management at times due to drifting to one side; assist for  parts  Modified Rankin (Stroke Patients Only)       Balance Overall balance assessment: Needs assistance         Standing balance support: Bilateral upper extremity supported Standing balance-Leahy Scale: Poor Standing balance comment: doesn't stand without UE support                    Cognition Arousal/Alertness: Awake/alert Behavior During Therapy: WFL for tasks assessed/performed Overall Cognitive Status: Within Functional Limits for tasks assessed                      Exercises      General Comments        Pertinent Vitals/Pain Pain Assessment: No/denies pain    Home Living                      Prior Function            PT Goals (current goals can now be found in the care plan section) Progress towards PT goals: Progressing toward goals    Frequency  Min 3X/week    PT Plan Current plan remains appropriate    Co-evaluation             End of Session Equipment Utilized During Treatment: Oxygen Activity Tolerance: Patient limited by fatigue Patient left: in chair;with call bell/phone within reach;with family/visitor present     Time: 1545-1616 PT Time Calculation (min) (ACUTE ONLY): 31 min  Charges:  $Therapeutic  Activity: 8-22 mins $Wheel Chair Management: 8-22 mins                    G Codes:      Reginia Naas 2015-06-21, 4:49 PM  Magda Kiel, [place] 2015-06-21

## 2015-06-19 NOTE — Care Management Important Message (Signed)
Important Message  Patient Details  Name: [name] Sugden, MD MRN: [id number] Date of Birth: July 20, 1925   Medicare Important Message Given:  Yes    Loann Quill 06/19/2015, 2:24 PM

## 2015-06-19 NOTE — Progress Notes (Signed)
PROGRESS NOTE    [name] Azure, MD  TN:[id number] DOB: [birth year] DOA: 06/11/2015 PCP: [name], MD   Brief Narrative:  82 y.o. WM ([place]) PMHx COPD on home O2 (2.5 L at night), CAD s/p PCI 3 in 2011, cardiac arrest and VF s/p ICD, chronic systolic CHF last EF [id number]   Presented with dyspnea, fever, and yellow sputum. The patient started to develop cough productive of yellow sputum about a week prior accompanied by dyspnea. He saw his Cardiologist who noted fluid overload, and prescribed Lasix for three days. There was no fever and a CXR was negative at that time. Symptoms persisted and he went to his PCP four days prior who prescribed prednisone, Abx, and increased albuterol. The night of his admission the patient woke with chills, sweats, worsening cough, and he couldn't breathe.   In the ED the pt was in respiratory distress, and febrile to 104.57F rectally. CXR without opacity. Lactate 2.95.   Assessment & Plan:   Principal Problem:   Sepsis ([place]) Active Problems:   Anemia of chronic disease   Chronic kidney disease, stage 3   Automatic implantable cardioverter-defibrillator in situ   Chronic systolic CHF (congestive heart failure) (HCC)   Acute respiratory failure (HCC)   COPD exacerbation (HCC)   Severe protein-calorie malnutrition (HCC)   Sepsis due to Escherichia coli (E. coli) (HCC)   Bacteremia, escherichia coli   Pyelonephritis due to Escherichia coli   Dysphagia   Cardiomyopathy, ischemic   CKD (chronic kidney disease), stage III   Sepsis due to pneumonia ([place])   Hyperkalemia   CAP (community acquired pneumonia)   Microcytic anemia   Acute on chronic respiratory failure with hypoxia (HCC)   Acute pyelonephritis   Presbyesophagus   Sepsis/ positive E coli bacteremia due to positive E coli Pyelonephritis -Lactic acidosis resolved -DuoNeb QID -Pulmicort BID -Solu-Medrol 60 mg daily -Flutter valve -Physiotherapy vest BID -Will require 2 weeks  antibiotics; on 5/21 transitioned to PO; -Repeat blood culture 5/19 NGTD  COPD exacerbation/CAP/ Acute on chronic hypoxic respiratory failure  -On home 2.5 L O2 at night  -Flu negative  -See sepsis -Continued copious amounts of thick yellow sputum (significantly decreased from time of admission) -Ambulatory SPO2 5/20  Primary Esophageal Dysphagia/Presbyesophagus@@@@ -After patient's respiratory status improved will consult GI prior to discharge possible stricture -5/23 consulted GI prior to his discharge. Dr. Michail Sermon GI recommends Esophageal Barium swallow will request.  -5/23 per esophagram; dysmotility most likely Presbyesophagus  -Restart dysphagia 1; thin liquid   -Gaviscon 30 mL after each meal  -Ensure sitting in chair for your meals and remain upright for sitting in chair postprandial for 3 hours  -Eat small frequent meals  -Be careful in taking pills; ensure you take pills with at least 12 ounces water remain upright for at least 1  hour  CAD s/p PCI2011 -Asymptomatic   Chronic systolic CHF- Ischemic Cardiomyopathy - AICD EF 55-60% w/ grade 1 DD via TTE 06/06/15 -Daily weight Filed Weights   06/17/15 0331 06/18/15 2058 06/19/15 2000  Weight: 84.5 kg (186 lb 4.6 oz) 76.386 kg (168 lb 6.4 oz) 75.116 kg (165 lb 9.6 oz)  -Strict in and out since admission -4.1 L -Transfuse for hemoglobin<8   Recent Labs Lab 06/15/15 0517 06/16/15 0426 06/17/15 0235 06/18/15 0442 06/19/15 0513  HGB 7.8* 8.0* 9.1* 9.1* 9.8*  -On 5/20 contact CHF clinic/PCC M and request cardiopulmonary rehabilitation on discharge  -5/20 transfuse 1 unit PRBC   Mild AoS -Per TTE 06/06/15 -See CHF  CKD stage III  Lab Results  Component Value Date   CREATININE 1.24 06/19/2015   CREATININE 1.21 06/18/2015   CREATININE 1.28* 06/17/2015   Chronic microcytic anemia(baseline Hgb appears to be ~9 ) -B thalassemia and chronic disease   Recent Labs Lab 06/11/15 0145 06/11/15 0741  06/12/15 0432 06/13/15 0501  HGB 9.1* 7.7* 7.5* 8.0*  -Occult blood card 3  Hyperkalemia -Resolved  GERD -Pepcid 20 mg PRN  -Protonix 40 mg BID      Rimantadine DVT prophylaxis:Lovenox Code Status: FULL - pt clear he would desire "short term" vent support but not "long term"  Family Communication: Daughter at bedside Disposition Plan: Discharge 5/24 home patient does not meet criteria for cardiac rehabilitation.   Consultants:  NA  Procedures/Significant Events:  5/10 echocardiogram;Left ventricle: mild LVH. -LVEF= 55% to 60%. -(grade 1 diastolic dysfunction).  5/16 barium swallow; primary esophageal dysphagia. Incomplete clearance barium through his esophagus. -Moderate aspiration risk 5/20 Transfuse 1 unit PRBC 5/23 esophagram/barium swallow;Limited exam. -Negative obstruction. -Esophageal dysmotility most suggestive of presbyesophagus  Cultures 5/15 blood right forearm 2 positive Escherichia coli 5/15 urine positive Escherichia coli 5/15 influenza panel negative  5/16 sputum Normal flora 5/19 blood right hand/left arm NGTD    Antimicrobials: Zosyn 5/15 1 dose Vanc 5/15 1 dose Azithromycin 5/151 dose Rocephin 5/15 >> 5/21 Cefixime 5/21>>   Devices    LINES / TUBES:      Continuous Infusions:     Subjective: 5/24 A/O 4, states respiratory status improved.      Objective: Filed Vitals:   06/19/15 0821 06/19/15 1631 06/19/15 1859 06/19/15 2000  BP: 120/64  118/75 112/76  Pulse: 64  78 79  Temp: 97.3 F (36.3 C)  98.4 F (36.9 C) 97.8 F (36.6 C)  TempSrc: Oral  Oral Oral  Resp: 20  20 20   Height:      Weight:    75.116 kg (165 lb 9.6 oz)  SpO2: 100% 94% 98% 95%    Intake/Output Summary (Last 24 hours) at 06/19/15 2014 Last data filed at 06/19/15 2001  Gross per 24 hour  Intake    480 ml  Output   1525 ml  Net  -1045 ml   Filed Weights   06/17/15 0331 06/18/15 2058 06/19/15 2000  Weight: 84.5 kg (186 lb 4.6 oz) 76.386  kg (168 lb 6.4 oz) 75.116 kg (165 lb 9.6 oz)    Examination:  General: A/O 4, positive chronic respiratory distress but improved. Eyes: negative scleral hemorrhage, negative anisocoria, negative icterus ENT: Negative Runny nose, negative gingival bleeding, Neck:  Negative scars, masses, torticollis, lymphadenopathy, JVD Lungs:  positive Mild rhonchi (most likely baseline),  Negative expiratory wheezes  Cardiovascular: Regular rate and rhythm without murmur gallop or rub normal S1 and S2 Abdomen: negative abdominal pain, nondistended, positive soft, bowel sounds, no rebound, no ascites, no appreciable mass Extremities: No significant cyanosis, clubbing, or edema bilateral lower extremities Skin: Negative rashes, lesions, ulcers Psychiatric:  Negative depression, negative anxiety, negative fatigue, negative mania Central nervous system:  Cranial nerves II through XII intact, tongue/uvula midline, all extremities muscle strength 5/5, sensation intact throughout, finger nose finger bilateral within normal limits, quick finger touch bilateral within normal limits,  negative dysarthria, negative expressive aphasia, negative receptive aphasia.  .     Data Reviewed: Care during the described time interval was provided by me .  I have reviewed this patient's available data, including medical history, events of note, physical examination, and all test results as part  of my evaluation. I have personally reviewed and interpreted all radiology studies.  CBC:  Recent Labs Lab 06/14/15 0510 06/15/15 0517 06/16/15 0426 06/17/15 0235 06/18/15 0442 06/19/15 0513  WBC 3.9* 5.2 4.6 5.3 6.6 7.1  NEUTROABS 3.6 4.1 3.7 4.2 5.4  --   HGB 8.0* 7.8* 8.0* 9.1* 9.1* 9.8*  HCT 25.5* 25.5* 25.8* 29.0* 27.9* 31.2*  MCV 67.5* 68.5* 67.5* 69.0* 67.6* 70.1*  PLT 82* 77* 68* 72* 75* 74*   Basic Metabolic Panel:  Recent Labs Lab 06/14/15 0510 06/15/15 0517 06/16/15 0426 06/17/15 0235 06/18/15 0442  06/19/15 0513  NA 143 145 144 141 141 139  K 5.3* 4.7 4.7 4.6 4.6 4.7  CL 108 107 104 101 103 99*  CO2 25 28 31  32 31 31  GLUCOSE 179* 97 122* 102* 96 89  BUN 47* 47* 39* 35* 32* 36*  CREATININE 1.42* 1.39* 1.22 1.28* 1.21 1.24  CALCIUM 8.4* 8.4* 8.5* 8.5* 8.6* 8.6*  MG 2.6* 2.3 2.1 2.0 2.0  --    GFR: Estimated Creatinine Clearance: 43.7 mL/min (by C-G formula based on Cr of 1.24). Liver Function Tests:  Recent Labs Lab 06/14/15 0510 06/15/15 0517 06/16/15 0426 06/17/15 0235 06/18/15 0442  AST 23 23 17  14* 14*  ALT 43 39 33 30 30  ALKPHOS 35* 34* 32* 32* 31*  BILITOT 0.3 0.5 0.5 0.7 0.6  PROT 5.5* 5.5* 5.4* 5.4* 5.2*  ALBUMIN 2.6* 2.7* 2.6* 2.6* 2.7*   No results for input(s): LIPASE, AMYLASE in the last 168 hours. No results for input(s): AMMONIA in the last 168 hours. Coagulation Profile: No results for input(s): INR, PROTIME in the last 168 hours. Cardiac Enzymes: No results for input(s): CKTOTAL, CKMB, CKMBINDEX, TROPONINI in the last 168 hours. BNP (last 3 results) No results for input(s): PROBNP in the last 8760 hours. HbA1C: No results for input(s): HGBA1C in the last 72 hours. CBG: No results for input(s): GLUCAP in the last 168 hours. Lipid Profile: No results for input(s): CHOL, HDL, LDLCALC, TRIG, CHOLHDL, LDLDIRECT in the last 72 hours. Thyroid Function Tests: No results for input(s): TSH, T4TOTAL, FREET4, T3FREE, THYROIDAB in the last 72 hours. Anemia Panel: No results for input(s): VITAMINB12, FOLATE, FERRITIN, TIBC, IRON, RETICCTPCT in the last 72 hours. Urine analysis:    Component Value Date/Time   COLORURINE YELLOW 06/11/2015 0515   APPEARANCEUR CLOUDY* 06/11/2015 0515   LABSPEC 1.023 06/11/2015 0515   LABSPEC 1.025 05/01/2010 1454   PHURINE 5.0 06/11/2015 0515   PHURINE 5.0 05/01/2010 1454   GLUCOSEU NEGATIVE 06/11/2015 0515   HGBUR LARGE* 06/11/2015 0515   HGBUR Negative 05/01/2010 1454   BILIRUBINUR NEGATIVE 06/11/2015 0515    BILIRUBINUR Negative 05/01/2010 1454   KETONESUR NEGATIVE 06/11/2015 0515   KETONESUR Negative 05/01/2010 1454   PROTEINUR NEGATIVE 06/11/2015 0515   PROTEINUR Negative 05/01/2010 1454   UROBILINOGEN 0.2 06/07/2014 2018   NITRITE POSITIVE* 06/11/2015 0515   NITRITE Negative 05/01/2010 1454   LEUKOCYTESUR MODERATE* 06/11/2015 0515   LEUKOCYTESUR Negative 05/01/2010 1454   Sepsis Labs: @LABRCNTIP (procalcitonin:4,lacticidven:4)  ) Recent Results (from the past 240 hour(s))  Blood Culture (routine x 2)     Status: Abnormal   Collection Time: 06/11/15  1:45 AM  Result Value Ref Range Status   Specimen Description BLOOD LEFT FOREARM  Final   Special Requests BOTTLES DRAWN AEROBIC AND ANAEROBIC 5CC  Final   Culture  Setup Time   Final    GRAM NEGATIVE RODS IN BOTH AEROBIC AND ANAEROBIC BOTTLES  CRITICAL RESULT CALLED TO, READ BACK BY AND VERIFIED WITH: A MEYER PHARMD [id number] 06/11/15 A BROWNING    Culture (A)  Final    ESCHERICHIA COLI SUSCEPTIBILITIES PERFORMED ON PREVIOUS CULTURE WITHIN THE LAST 5 DAYS.    Report Status 06/13/2015 FINAL  Final  Blood Culture (routine x 2)     Status: Abnormal   Collection Time: 06/11/15  1:50 AM  Result Value Ref Range Status   Specimen Description BLOOD RIGHT FOREARM  Final   Special Requests BOTTLES DRAWN AEROBIC AND ANAEROBIC 5CC   Final   Culture  Setup Time   Final    GRAM NEGATIVE RODS IN BOTH AEROBIC AND ANAEROBIC BOTTLES Organism ID to follow CRITICAL RESULT CALLED TO, READ BACK BY AND VERIFIED WITH: A MEYER PHARMD [id number] 06/11/15 A BROWNING    Culture ESCHERICHIA COLI (A)  Final   Report Status 06/13/2015 FINAL  Final   Organism ID, Bacteria ESCHERICHIA COLI  Final      Susceptibility   Escherichia coli - MIC*    AMPICILLIN <=2 SENSITIVE Sensitive     CEFAZOLIN <=4 SENSITIVE Sensitive     CEFEPIME <=1 SENSITIVE Sensitive     CEFTAZIDIME <=1 SENSITIVE Sensitive     CEFTRIAXONE <=1 SENSITIVE Sensitive     CIPROFLOXACIN <=0.25 SENSITIVE  Sensitive     GENTAMICIN <=1 SENSITIVE Sensitive     IMIPENEM <=0.25 SENSITIVE Sensitive     TRIMETH/SULFA <=20 SENSITIVE Sensitive     AMPICILLIN/SULBACTAM <=2 SENSITIVE Sensitive     PIP/TAZO <=4 SENSITIVE Sensitive     * ESCHERICHIA COLI  Blood Culture ID Panel (Reflexed)     Status: Abnormal   Collection Time: 06/11/15  1:50 AM  Result Value Ref Range Status   Enterococcus species NOT DETECTED NOT DETECTED Final   Vancomycin resistance NOT DETECTED NOT DETECTED Final   Listeria monocytogenes NOT DETECTED NOT DETECTED Final   Staphylococcus species NOT DETECTED NOT DETECTED Final   Staphylococcus aureus NOT DETECTED NOT DETECTED Final   Methicillin resistance NOT DETECTED NOT DETECTED Final   Streptococcus species NOT DETECTED NOT DETECTED Final   Streptococcus agalactiae NOT DETECTED NOT DETECTED Final   Streptococcus pneumoniae NOT DETECTED NOT DETECTED Final   Streptococcus pyogenes NOT DETECTED NOT DETECTED Final   Acinetobacter baumannii NOT DETECTED NOT DETECTED Final   Enterobacteriaceae species NOT DETECTED NOT DETECTED Final   Enterobacter cloacae complex NOT DETECTED NOT DETECTED Final   Escherichia coli DETECTED (A) NOT DETECTED Final    Comment: CRITICAL RESULT CALLED TO, READ BACK BY AND VERIFIED WITH: A MEYER PHARMD 1737 06/11/15 A BROWNING    Klebsiella oxytoca NOT DETECTED NOT DETECTED Final   Klebsiella pneumoniae NOT DETECTED NOT DETECTED Final   Proteus species NOT DETECTED NOT DETECTED Final   Serratia marcescens NOT DETECTED NOT DETECTED Final   Carbapenem resistance NOT DETECTED NOT DETECTED Final   Haemophilus influenzae NOT DETECTED NOT DETECTED Final   Neisseria meningitidis NOT DETECTED NOT DETECTED Final   Pseudomonas aeruginosa NOT DETECTED NOT DETECTED Final   Candida albicans NOT DETECTED NOT DETECTED Final   Candida glabrata NOT DETECTED NOT DETECTED Final   Candida krusei NOT DETECTED NOT DETECTED Final   Candida parapsilosis NOT DETECTED NOT  DETECTED Final   Candida tropicalis NOT DETECTED NOT DETECTED Final  Urine culture     Status: Abnormal   Collection Time: 06/11/15  5:16 AM  Result Value Ref Range Status   Specimen Description URINE, CATHETERIZED  Final   Special Requests NONE  Final   Culture 70,000 COLONIES/mL ESCHERICHIA COLI (A)  Final   Report Status 06/13/2015 FINAL  Final   Organism ID, Bacteria ESCHERICHIA COLI (A)  Final      Susceptibility   Escherichia coli - MIC*    AMPICILLIN <=2 SENSITIVE Sensitive     CEFAZOLIN <=4 SENSITIVE Sensitive     CEFTRIAXONE <=1 SENSITIVE Sensitive     CIPROFLOXACIN <=0.25 SENSITIVE Sensitive     GENTAMICIN <=1 SENSITIVE Sensitive     IMIPENEM <=0.25 SENSITIVE Sensitive     NITROFURANTOIN <=16 SENSITIVE Sensitive     TRIMETH/SULFA <=20 SENSITIVE Sensitive     AMPICILLIN/SULBACTAM <=2 SENSITIVE Sensitive     PIP/TAZO <=4 SENSITIVE Sensitive     * 70,000 COLONIES/mL ESCHERICHIA COLI  MRSA PCR Screening     Status: None   Collection Time: 06/11/15  7:46 AM  Result Value Ref Range Status   MRSA by PCR NEGATIVE NEGATIVE Final    Comment:        The GeneXpert MRSA Assay (FDA approved for NASAL specimens only), is one component of a comprehensive MRSA colonization surveillance program. It is not intended to diagnose MRSA infection nor to guide or monitor treatment for MRSA infections.   Culture, sputum-assessment     Status: None   Collection Time: 06/12/15  8:20 AM  Result Value Ref Range Status   Specimen Description SPUTUM  Final   Special Requests NONE  Final   Sputum evaluation   Final    THIS SPECIMEN IS ACCEPTABLE. RESPIRATORY CULTURE REPORT TO FOLLOW.   Report Status 06/12/2015 FINAL  Final  Culture, respiratory (NON-Expectorated)     Status: None   Collection Time: 06/12/15  8:20 AM  Result Value Ref Range Status   Specimen Description SPUTUM  Final   Special Requests NONE  Final   Gram Stain   Final    MODERATE WBC PRESENT,BOTH PMN AND MONONUCLEAR RARE  SQUAMOUS EPITHELIAL CELLS PRESENT NO ORGANISMS SEEN THIS SPECIMEN IS ACCEPTABLE FOR SPUTUM CULTURE Performed at [facility]    Culture   Final    NORMAL OROPHARYNGEAL FLORA Performed at [facility]    Report Status 06/15/2015 FINAL  Final  Culture, blood (routine x 2)     Status: None (Preliminary result)   Collection Time: 06/15/15  4:35 AM  Result Value Ref Range Status   Specimen Description BLOOD RIGHT HAND  Final   Special Requests IN PEDIATRIC BOTTLE 3CC  Final   Culture NO GROWTH 4 DAYS  Final   Report Status PENDING  Incomplete  Culture, blood (routine x 2)     Status: None (Preliminary result)   Collection Time: 06/15/15  4:41 AM  Result Value Ref Range Status   Specimen Description BLOOD LEFT ARM  Final   Special Requests IN PEDIATRIC BOTTLE 3CC  Final   Culture NO GROWTH 4 DAYS  Final   Report Status PENDING  Incomplete         Radiology Studies: Dg Esophagus  06/19/2015  CLINICAL DATA:  Coughing with meals. No aspiration on modified barium swallow. Central pneumonia. EXAM: ESOPHOGRAM/BARIUM SWALLOW TECHNIQUE: Single contrast examination was performed using  thin barium. FLUOROSCOPY TIME:  Radiation Exposure Index (as provided by the fluoroscopic device): If the device does not provide the exposure index: Fluoroscopy Time:  1 minutes 24 seconds Number of Acquired Images:  6 COMPARISON:  Chest radiograph 06/14/2015 FINDINGS: Limited exam due to patient mobility. Patient was examined in a supine position approximately 30 degrees  upright. No stricture or mass within the esophagus. Contrast flowed into the stomach. There was poor initiation of peristalsis within the esophagus. There is stasis of barium contents within the esophagus with mild to and fro motion. IMPRESSION: 1. Limited exam.  No obstruction of the esophagus identified. 2. Esophageal dysmotility most suggestive of presbyesophagus. Electronically Signed   By: Suzy Bouchard M.D.   On: 06/19/2015  14:49        Scheduled Meds: . aluminum hydroxide-magnesium carbonate  30 mL Oral TID PC  . aspirin EC  81 mg Oral QHS  . budesonide  0.5 mg Nebulization BID  . cefUROXime  500 mg Oral BID WC  . clopidogrel  75 mg Oral Daily  . enoxaparin (LOVENOX) injection  40 mg Subcutaneous Q24H  . feeding supplement (ENSURE ENLIVE)  237 mL Oral TID BM  . furosemide  20 mg Oral Daily  . guaiFENesin  600 mg Oral BID  . ipratropium-albuterol  3 mL Nebulization QID  . metoCLOPramide  5 mg Oral TID AC & HS  . pantoprazole  40 mg Oral BID  . polyethylene glycol  17 g Oral Daily  . rosuvastatin  5 mg Oral Daily  . senna-docusate  1 tablet Oral BID  . sodium chloride flush  3 mL Intravenous Q12H  . vitamin B-12  5,000 mcg Oral Daily   Continuous Infusions:     LOS: 8 days    Time spent: 40 minutes    Tahjay Binion, Geraldo Docker, MD [facility] Pager [phone]   If 7PM-7AM, please contact night-coverage [email or web address] Password [facility] 06/19/2015, 8:14 PM

## 2015-06-19 NOTE — Care Management Note (Addendum)
Case Management Note  Patient Details  Name: [name] Mulcahy, MD MRN: [id number] Date of Birth: September 04, 1925  Subjective/Objective:                 CM following for progression and d/c planning.    Action/Plan: 06/19/2015 Per Dr Philis Pique, he would like to have this pt seen at [facility] and to go to [facility].  This CM contacted CHF clinic, nurse navigator , Darnelle Bos will eval this pt for possible CHF followup at the clinic. Will continue to follow.  Received call from [facility] , of CHF clinic and after reviewing this pt record , he does not meet criteria to be seen as he has a cardiologist, has EF 55-60% and does not meet criteria for the clinic. Dr Sherral Hammers informed. [facility] reordered.   Expected Discharge Date:      06/20/2015            Expected Discharge Plan:  Home/Self Care  In-House Referral:  NA  Discharge planning Services  CM Consult  Post Acute Care Choice:    Choice offered to:     DME Arranged:    DME Agency:     HH Arranged:  PT, OT RN [place] Agency:   [facility]  Status of Service:  Completed, signed off  Medicare Important Message Given:  Yes Date Medicare IM Given:    Medicare IM give by:    Date Additional Medicare IM Given:    Additional Medicare Important Message give by:     If discussed at [place] of Stay Meetings, dates discussed:    Additional Comments:  Adron Bene, RN 06/19/2015, 11:08 AM

## 2015-06-20 ENCOUNTER — Ambulatory Visit (INDEPENDENT_AMBULATORY_CARE_PROVIDER_SITE_OTHER): Payer: Medicare Other | Admitting: *Deleted

## 2015-06-20 DIAGNOSIS — Z95810 Presence of automatic (implantable) cardiac defibrillator: Secondary | ICD-10-CM

## 2015-06-20 DIAGNOSIS — I255 Ischemic cardiomyopathy: Secondary | ICD-10-CM

## 2015-06-20 DIAGNOSIS — R1310 Dysphagia, unspecified: Secondary | ICD-10-CM

## 2015-06-20 DIAGNOSIS — J9621 Acute and chronic respiratory failure with hypoxia: Secondary | ICD-10-CM

## 2015-06-20 DIAGNOSIS — K228 Other specified diseases of esophagus: Secondary | ICD-10-CM

## 2015-06-20 DIAGNOSIS — N12 Tubulo-interstitial nephritis, not specified as acute or chronic: Secondary | ICD-10-CM

## 2015-06-20 DIAGNOSIS — B9620 Unspecified Escherichia coli [E. coli] as the cause of diseases classified elsewhere: Secondary | ICD-10-CM

## 2015-06-20 LAB — CULTURE, BLOOD (ROUTINE X 2)
CULTURE: NO GROWTH
Culture: NO GROWTH

## 2015-06-20 MED ORDER — IPRATROPIUM-ALBUTEROL 0.5-2.5 (3) MG/3ML IN SOLN
3.0000 mL | Freq: Three times a day (TID) | RESPIRATORY_TRACT | Status: DC
  Administered 2015-06-20: 3 mL via RESPIRATORY_TRACT
  Filled 2015-06-20: qty 3

## 2015-06-20 MED ORDER — CEFUROXIME AXETIL 500 MG PO TABS: 500.0000 mg | ORAL_TABLET | Freq: Two times a day (BID) | ORAL | Status: DC

## 2015-06-20 MED ORDER — ALUM HYDROXIDE-MAG CARBONATE 95-358 MG/15ML PO SUSP: 30.0000 mL | Freq: Three times a day (TID) | ORAL | Status: DC

## 2015-06-20 NOTE — Progress Notes (Signed)
Patient Discharge: Disposition: Patient discharged to home with family accompanying. Education: Reviewed discharge instructions, follow-up appointment, medications, prescriptions, understood and acknowledged. IV: Discontinued before discharge. Telemetry: N/A Discontinued Foley before discharge. Transportation: Patient transported in w/c accompanied by the staff and family. Belongings: Patient took all his belongings with him.

## 2015-06-20 NOTE — Progress Notes (Signed)
Speech Language Pathology Treatment: Dysphagia  Patient Details Name: Lejuan Shelburn, MD MRN: [id number] DOB: January 05, 1926 Today's Date: 06/20/2015 Time: [id number] SLP Time Calculation (min) (ACUTE ONLY): 17 min  Assessment / Plan / Recommendation Clinical Impression  Pt observed to tolerate thin liquids and puree without signs of aspiration. Esophagram showed dysmotility due to presbyesophagus. Discussed findings and reviewed esophageal strategies with pt. Will trial upgrade solids per pt wishes. Dys 2/thin.    HPI        SLP Plan  Continue with current plan of care     Recommendations  Diet recommendations: Dysphagia 2 (fine chop);Thin liquid Liquids provided via: Cup;Straw Medication Administration: Crushed with puree Supervision: Patient able to self feed;Full supervision/cueing for compensatory strategies Compensations: Slow rate;Small sips/bites;Follow solids with liquid Postural Changes and/or Swallow Maneuvers: Seated upright 90 degrees;Upright 30-60 min after meal             Plan: Continue with current plan of care     [place] Roberta Angell, MA CCC-SLP [id number]  Lynann Beaver 06/20/2015, 11:10 AM

## 2015-06-20 NOTE — Progress Notes (Signed)
  [place] TEAM 1 - Stepdown/ICU TEAM  After preparing the patient for discharge spoke with his daughter via phone.  His daughter expressed significant concern about the patient's fitness for discharge home.  I explained to her that physical therapy had already opined that the patient was stable for discharge home with home health therapies.  The patient's daughter expressed interest in considering a short inpatient rehabilitation stay prior to discharge home.  I agreed to ask for another therapy evaluation to determine if rehabilitation was appropriate.  Occupational therapy saw the patient and did feel he could potentially benefit from a rehabilitation stay.  The rehabilitation team was therefore consulted but the patient himself inform the rehabilitation team physician assistant that he was not interested and wished to be discharged home.  As a result I am moving forward with discharge home in that the patient is medically stable and has refused consideration of admission to the rehabilitation unit.  Cherene Altes, MD [facility] Office  [phone] Pager - Text Page per Amion as per below:  On-Call/Text Page:      [email or web address]      password [email or web address]  If 7PM-7AM, please contact night-coverage [email or web address] Password [facility] 06/20/2015, 4:42 PM

## 2015-06-20 NOTE — Progress Notes (Signed)
Occupational Therapy Treatment Patient Details Name: Jordynn Mo, MD MRN: [id number] DOB: [birth year] Today's Date: 06/20/2015    History of present illness 82 y.o. male with a history of COPD on home O2, CAD s/p PCI 3 in 2011, cardiac arrest and VF s/p ICD, chronic systolic CHF last EF [id number] who presented with dyspnea, fever, and yellow sputum.   OT comments  Pt is very motivated with OT.  He overall requires min A for ADLs, but performs them mostly at w/c level to conserve energy.  In speaking with pt, he has been using his w/c for the past 3-4 months "due to the COPD", and was ambulatory prior to that.  He did have a signficant LOB while standing requiring mod A to recover.  He may benefit from a short CIR stay to maximize his strength and independence, decrease fall risk, thus reducing his risk for readmission.     Follow Up Recommendations  CIR - If pt does not qualify for CIR, then recommend Home health OT;Supervision/Assistance - 24 hour   Equipment Recommendations  None recommended by OT    Recommendations for Other Services      Precautions / Restrictions Precautions Precautions: Fall Precaution Comments: O2 dependent       Mobility Bed Mobility Overal bed mobility: Needs Assistance Bed Mobility: Supine to Sit     Supine to sit: Min guard        Transfers Overall transfer level: Needs assistance Equipment used: Rolling walker (2 wheeled) Transfers: Sit to/from [facility] Sit to Stand: Min guard Stand pivot transfers: Min guard       General transfer comment: assist for hand placement and safety     Balance Overall balance assessment: Needs assistance Sitting-balance support: Feet supported Sitting balance-Leahy Scale: Good     Standing balance support: Bilateral upper extremity supported Standing balance-Leahy Scale: Poor Standing balance comment: requires bil. UE support.  As he fatigued, he had a posterior LOB requiring mod A to  recover.                    ADL Overall ADL's : Needs assistance/impaired Eating/Feeding: Modified independent;Bed level;Sitting   Grooming: Wash/dry hands;Wash/dry face;Oral care;Set up;Sitting   Upper Body Bathing: Set up;Supervision/ safety;Sitting   Lower Body Bathing: Minimal assistance;Sit to/from stand   Upper Body Dressing : Set up;Sitting   Lower Body Dressing: Minimal assistance;Sit to/from stand   Toilet Transfer: Minimal assistance;Stand-pivot;Comfort height toilet;Regular Toilet;Grab bars;RW [employer] technical [employer] Details (indicate cue type and reason): min A for balance  Toileting- Clothing Manipulation and Hygiene: Minimal assistance;Bed level       Functional mobility during ADLs: Minimal assistance;Min guard;Rolling walker General ADL Comments: Pt requires min A for standing balance during ADLs - he fluctuates between min guard assist and min A - as he fatigues, he requires increased amount of assistance       Vision                     Perception     Praxis      Cognition   Behavior During Therapy: WFL for tasks assessed/performed Overall Cognitive Status: Within Functional Limits for tasks assessed                       Extremity/Trunk Assessment               Exercises     Shoulder Instructions       General Comments  Pertinent Vitals/ Pain       Pain Assessment: No/denies pain  Home Living                                          Prior Functioning/Environment              Frequency Min 2X/week     Progress Toward Goals  OT Goals(current goals can now be found in the care plan section)  Progress towards OT goals: Progressing toward goals  ADL Goals Pt Will Perform Lower Body Bathing: with min assist;sit to/from stand Pt Will Perform Lower Body Dressing: with min assist;with adaptive equipment;sit to/from stand Pt Will Transfer to Toilet: with min guard assist;ambulating;bedside  commode Additional ADL Goal #1: pt will initiate rest breaks without cues during adl for energy conservation Additional ADL Goal #2: pt eat at mod I level following strategies  Plan Discharge plan remains appropriate    Co-evaluation                 End of Session Equipment Utilized During Treatment: Rolling walker;Oxygen   Activity Tolerance Patient tolerated treatment well   Patient Left in chair;with call bell/phone within reach (wheelchair )   Nurse Communication          Time: [phone] OT Time Calculation (min): 43 min  Charges: OT General Charges $OT Visit: 1 Procedure OT Treatments $Self Care/Home Management : 8-22 mins $Therapeutic Activity: 23-37 mins  Nyheim Seufert M 06/20/2015, 3:00 PM

## 2015-06-20 NOTE — Progress Notes (Signed)
Patient focused on going home.  He reports that he uses WC at home and "walked down the hall" with PT today. He has an aide who comes in 4 hours daily and feels that he can manage at home with family assisting prn.  Will defer CIR consult.

## 2015-06-20 NOTE — Discharge Instructions (Signed)
Due to your decreased esophageal motility (presbyesophagus) you should follow the following precautions: -focus on very soft and even pureed foods -use Gaviscon 30 mL after each meal (or a similar antacid/anti-gas OTC treatment) -sit up in a chair for your meals and remain upright in a chair postprandial for 3 hours -eat small frequent meals -be careful in taking pills; ensure you take pills with at least 12 ounces of water and remain upright for at least 1 hour after   Pyelonephritis, Adult Pyelonephritis is a kidney infection. The kidneys are the organs that filter a person's blood and move waste out of the bloodstream and into the urine. Urine passes from the kidneys, through the ureters, and into the bladder. There are two main types of pyelonephritis:  Infections that come on quickly without any warning (acute pyelonephritis).  Infections that last for a long period of time (chronic pyelonephritis). In most cases, the infection clears up with treatment and does not cause further problems. More severe infections or chronic infections can sometimes spread to the bloodstream or lead to other problems with the kidneys. CAUSES This condition is usually caused by:  Bacteria traveling from the bladder to the kidney through infected urine. The urine in the bladder can become infected with bacteria from:  Bladder infection (cystitis).  Inflammation of the prostate gland (prostatitis).  Sexual intercourse, in females.  Bacteria traveling from the bloodstream to the kidney. RISK FACTORS This condition is more likely to develop in:  Pregnant women.  Older people.  People who have diabetes.  People who have kidney stones or bladder stones.  People who have other abnormalities of the kidney or ureter.  People who have a catheter placed in the bladder.  People who have cancer.  People who are sexually active.  Women who use spermicides.  People who have had a prior urinary tract  infection. SYMPTOMS Symptoms of this condition include:  Frequent urination.  Strong or persistent urge to urinate.  Burning or stinging when urinating.  Abdominal pain.  Back pain.  Pain in the side or flank area.  Fever.  Chills.  Blood in the urine, or dark urine.  Nausea.  Vomiting. DIAGNOSIS This condition may be diagnosed based on:  Medical history and physical exam.  Urine tests.  Blood tests. You may also have imaging tests of the kidneys, such as an ultrasound or CT scan. TREATMENT Treatment for this condition may depend on the severity of the infection.  If the infection is mild and is found early, you may be treated with antibiotic medicines taken by mouth. You will need to drink fluids to remain hydrated.  If the infection is more severe, you may need to stay in the hospital and receive antibiotics given directly into a vein through an IV tube. You may also need to receive fluids through an IV tube if you are not able to remain hydrated. After your hospital stay, you may need to take oral antibiotics for a period of time. Other treatments may be required, depending on the cause of the infection. HOME CARE INSTRUCTIONS Medicines  Take over-the-counter and prescription medicines only as told by your health care provider.  If you were prescribed an antibiotic medicine, take it as told by your health care provider. Do not stop taking the antibiotic even if you start to feel better. General Instructions  Drink enough fluid to keep your urine clear or pale yellow.  Avoid caffeine, tea, and carbonated beverages. They tend to irritate the bladder.  Urinate often. Avoid holding in urine for long periods of time.  Urinate before and after sex.  After a bowel movement, women should cleanse from front to back. Use each tissue only once.  Keep all follow-up visits as told by your health care provider. This is important. SEEK MEDICAL CARE IF:  Your symptoms  do not get better after 2 days of treatment.  Your symptoms get worse.  You have a fever. SEEK IMMEDIATE MEDICAL CARE IF:  You are unable to take your antibiotics or fluids.  You have shaking chills.  You vomit.  You have severe flank or back pain.  You have extreme weakness or fainting.   This information is not intended to replace advice given to you by your health care provider. Make sure you discuss any questions you have with your health care provider.   Document Released: 01/13/2005 Document Revised: 10/04/2014 Document Reviewed: 05/08/2014 Elsevier Interactive Patient Education [employer].

## 2015-06-20 NOTE — Discharge Summary (Signed)
DISCHARGE SUMMARY  [name], MD  MR#: [id number]  DOB:03/20/1925  Date of Admission: 06/11/2015 Date of Discharge: 06/20/2015  Attending Physician:Amri Lien T  Patient's [id number], MD  Consults:  none  Disposition: D/C home   Follow-up Appts:     Follow-up Information    Follow up with [facility].   Why:  HHPT, HHOT   Contact information:   [address] [place] [address] [phone]       Follow up with [name], MD. Schedule an appointment as soon as possible for a visit in 1 week.   Specialty:  Internal Medicine   Contact information:   [address] [place] [address] [phone]       Follow up with Jenkins Rouge, MD. Schedule an appointment as soon as possible for a visit in 2 weeks.   Specialty:  Cardiology   Contact information:   [id number] [place] [address] [phone]       Follow up with Ailene Rud, MD In 2 weeks.   Specialty:  Urology   Contact information:   [place] [address] [phone]      Tests Needing Follow-up: -assessment of diet tolerance -assessment of volume status  -assessment of respiratory status   Discharge Diagnoses: Sepsis w/ bacteremia due to E coli Pyelonephritis Acute bronchitis - Acute on chronic hypoxic respiratory failure  COPD exacerbation Primary Esophageal Dysphagia - Presbyesophagus CAD s/p [id number] Chronic systolic CHF- Ischemic Cardiomyopathy - AICD Mild AoS CKD stage III  Chronic microcytic anemia   Initial presentation: 82 y.o. male with a history of COPD on home O2, CAD s/p PCI 3 in 2011, cardiac arrest and VF s/p ICD, chronic systolic CHF last EF [id number] who presented with dyspnea, fever, and yellow sputum. The patient started to develop cough productive of yellow sputum about a week prior accompanied by dyspnea. He saw his Cardiologist who noted fluid overload, and  prescribed Lasix for three days. There was no fever and a CXR was negative at that time. Symptoms persisted and he went to his PCP four days prior who prescribed prednisone, Abx, and increased albuterol. The night of his admission the patient woke with chills, sweats, worsening cough, and he couldn't breathe.   In the ED the pt was in respiratory distress, and febrile to 104.30F rectally. CXR without opacity. Lactate 2.95.  Hospital Course:  Sepsis w/ bacteremia due to E coli Pyelonephritis Clinically stabilized from this regard - to complete 14 day course of abx tx - has been transitioned to oral meds   Acute bronchitis - Acute on chronic hypoxic respiratory failure - COPD exacerbation Flu negative - sats stable - pt has been refusing percussion vest tx - episodic anxiety appears to be well controlled at this time - acute exacerbation appears to be resolved at this time - stopped steroids - resume maintenance inhalers  Primary Esophageal Dysphagia - Presbyesophagus -case discussed w/ GI per Dr. Sherral Hammers - barium esophagram noted findings c/w presbyesophagus - tx plan as follows: -Cont dysphagia 1; thin liquid -Gaviscon 30 mL after each meal -Ensure sitting in chair for your meals and remain upright for sitting in chair postprandial for 3 hours -Eat small frequent meals -Be careful in taking pills; ensure you take pills with at least 12 ounces water remain upright for at least 1 hour  CAD s/p PCI2011 Asymptomatic   Chronic systolic CHF- Ischemic Cardiomyopathy - AICD EF 55-60% w/ grade 1  DD via TTE 06/06/15 - appears to be euvolemic at time of d/c - resumed his usual home diuretic  Filed Weights   06/17/15 0331 06/18/15 2058 06/19/15 2000  Weight: 84.5 kg (186 lb 4.6 oz) 76.386 kg (168 lb 6.4 oz) 75.116 kg (165 lb 9.6 oz)    Mild AoS Per TTE 06/06/15  CKD stage III  Renal fxn is stabe at d/c   Recent Labs Lab 06/14/15 0510 06/15/15 0517 06/16/15 0426 06/17/15 0235  06/18/15 0442 06/19/15 0513  CREATININE 1.42* 1.39* 1.22 1.28* 1.21 1.24    Chronic microcytic anemia B thalassemia and chronic disease - baseline Hgb appears to be ~9 - Hgb presently stable at his baseline     Medication List    STOP taking these medications        ALEVE 220 MG tablet  Generic drug:  naproxen sodium     azithromycin 250 MG tablet  Commonly known as:  ZITHROMAX     predniSONE 10 MG tablet  Commonly known as:  DELTASONE      TAKE these medications        albuterol (2.5 MG/3ML) 0.083% nebulizer solution  Commonly known as:  PROVENTIL  Take 2.5 mg by nebulization every 4 (four) hours as needed for wheezing or shortness of breath.     ALPRAZolam 0.25 MG tablet  Commonly known as:  XANAX  Take 0.25 mg by mouth at bedtime.     aluminum hydroxide-magnesium carbonate 95-358 MG/15ML Susp  Commonly known as:  GAVISCON  Take 30 mLs by mouth 3 (three) times daily after meals.     arformoterol 15 MCG/2ML Nebu  Commonly known as:  BROVANA  Take 2 mLs (15 mcg total) by nebulization 2 (two) times daily.     aspirin EC 81 MG tablet  Take 1 tablet (81 mg total) by mouth at bedtime.     budesonide 0.5 MG/2ML nebulizer solution  Commonly known as:  PULMICORT  Take 2 mLs (0.5 mg total) by nebulization 2 (two) times daily.     cefUROXime 500 MG tablet  Commonly known as:  CEFTIN  Take 1 tablet (500 mg total) by mouth 2 (two) times daily with a meal.     clopidogrel 75 MG tablet  Commonly known as:  PLAVIX  Take 75 mg by mouth daily.     ferrous sulfate 325 (65 FE) MG tablet  Take 325 mg by mouth daily with breakfast.     furosemide 20 MG tablet  Commonly known as:  LASIX  Take 1 tablet (20 mg total) by mouth daily.     guaiFENesin 600 MG 12 hr tablet  Commonly known as:  MUCINEX  Take 600 mg by mouth 2 (two) times daily. Take for 7 days - starting 06/08/15     Hydrocodone-Acetaminophen 5-300 MG Tabs  Take 1 tablet by mouth every 6 (six) hours as needed  (pain).     losartan 25 MG tablet  Commonly known as:  COZAAR  Take 25 mg by mouth daily after supper.     meloxicam 7.5 MG tablet  Commonly known as:  MOBIC  Take 7.5 mg by mouth daily as needed for pain.     methocarbamol 500 MG tablet  Commonly known as:  ROBAXIN  Take 500 mg by mouth 2 (two) times daily as needed for muscle spasms.     metoprolol tartrate 25 MG tablet  Commonly known as:  LOPRESSOR  Take 12.5 mg by mouth 2 (two) times daily.  Reported on 06/06/2015     niacin 250 MG tablet  Take 250 mg by mouth daily.     nitroGLYCERIN 0.4 MG SL tablet  Commonly known as:  NITROSTAT  Place 0.4 mg under the tongue every 5 (five) minutes as needed for chest pain. UP TO 3 DOSES THEN CALL EMS     OXYGEN  Inhale into the lungs at bedtime.     rosuvastatin 5 MG tablet  Commonly known as:  CRESTOR  Take 5 mg by mouth daily.     Vitamin B-12 5000 MCG Tbdp  Take 5,000 mcg by mouth daily.     vitamin C 500 MG tablet  Commonly known as:  ASCORBIC ACID  Take 500 mg by mouth daily.        Day of Discharge BP 111/59 mmHg  Pulse 78  Temp(Src) 97.8 F (36.6 C) (Oral)  Resp 20  Ht 5\' 11"  (1.803 m)  Wt 75.116 kg (165 lb 9.6 oz)  BMI 23.11 kg/m2  SpO2 91%  Physical Exam: General: No acute respiratory distress sitting up in chair  Lungs: Clear to auscultation bilaterally without wheezes or crackles - distant bs th/o  Cardiovascular: Regular rate and rhythm without murmur gallop or rub  Abdomen: Nontender, nondistended, soft, bowel sounds positive, no rebound, no ascites, no appreciable mass Extremities: No significant cyanosis, clubbing, or edema bilateral lower extremities  Basic Metabolic Panel:  Recent Labs Lab 06/14/15 0510 06/15/15 0517 06/16/15 0426 06/17/15 0235 06/18/15 0442 06/19/15 0513  NA 143 145 144 141 141 139  K 5.3* 4.7 4.7 4.6 4.6 4.7  CL 108 107 104 101 103 99*  CO2 25 28 31  32 31 31  GLUCOSE 179* 97 122* 102* 96 89  BUN 47* 47* 39* 35* 32*  36*  CREATININE 1.42* 1.39* 1.22 1.28* 1.21 1.24  CALCIUM 8.4* 8.4* 8.5* 8.5* 8.6* 8.6*  MG 2.6* 2.3 2.1 2.0 2.0  --     Liver Function Tests:  Recent Labs Lab 06/14/15 0510 06/15/15 0517 06/16/15 0426 06/17/15 0235 06/18/15 0442  AST 23 23 17  14* 14*  ALT 43 39 33 30 30  ALKPHOS 35* 34* 32* 32* 31*  BILITOT 0.3 0.5 0.5 0.7 0.6  PROT 5.5* 5.5* 5.4* 5.4* 5.2*  ALBUMIN 2.6* 2.7* 2.6* 2.6* 2.7*    CBC:  Recent Labs Lab 06/14/15 0510 06/15/15 0517 06/16/15 0426 06/17/15 0235 06/18/15 0442 06/19/15 0513  WBC 3.9* 5.2 4.6 5.3 6.6 7.1  NEUTROABS 3.6 4.1 3.7 4.2 5.4  --   HGB 8.0* 7.8* 8.0* 9.1* 9.1* 9.8*  HCT 25.5* 25.5* 25.8* 29.0* 27.9* 31.2*  MCV 67.5* 68.5* 67.5* 69.0* 67.6* 70.1*  PLT 82* 77* 68* 72* 75* 74*     Recent Results (from the past 240 hour(s))  Blood Culture (routine x 2)     Status: Abnormal   Collection Time: 06/11/15  1:45 AM  Result Value Ref Range Status   Specimen Description BLOOD LEFT FOREARM  Final   Special Requests BOTTLES DRAWN AEROBIC AND ANAEROBIC 5CC  Final   Culture  Setup Time   Final    GRAM NEGATIVE RODS IN BOTH AEROBIC AND ANAEROBIC BOTTLES CRITICAL RESULT CALLED TO, READ BACK BY AND VERIFIED WITH: A MEYER PHARMD 1737 06/11/15 A BROWNING    Culture (A)  Final    ESCHERICHIA COLI SUSCEPTIBILITIES PERFORMED ON PREVIOUS CULTURE WITHIN THE LAST 5 DAYS.    Report Status 06/13/2015 FINAL  Final  Blood Culture (routine x 2)  Status: Abnormal   Collection Time: 06/11/15  1:50 AM  Result Value Ref Range Status   Specimen Description BLOOD RIGHT FOREARM  Final   Special Requests BOTTLES DRAWN AEROBIC AND ANAEROBIC 5CC   Final   Culture  Setup Time   Final    GRAM NEGATIVE RODS IN BOTH AEROBIC AND ANAEROBIC BOTTLES Organism ID to follow CRITICAL RESULT CALLED TO, READ BACK BY AND VERIFIED WITH: A MEYER PHARMD [id number] 06/11/15 A BROWNING    Culture ESCHERICHIA COLI (A)  Final   Report Status 06/13/2015 FINAL  Final   Organism ID,  Bacteria ESCHERICHIA COLI  Final      Susceptibility   Escherichia coli - MIC*    AMPICILLIN <=2 SENSITIVE Sensitive     CEFAZOLIN <=4 SENSITIVE Sensitive     CEFEPIME <=1 SENSITIVE Sensitive     CEFTAZIDIME <=1 SENSITIVE Sensitive     CEFTRIAXONE <=1 SENSITIVE Sensitive     CIPROFLOXACIN <=0.25 SENSITIVE Sensitive     GENTAMICIN <=1 SENSITIVE Sensitive     IMIPENEM <=0.25 SENSITIVE Sensitive     TRIMETH/SULFA <=20 SENSITIVE Sensitive     AMPICILLIN/SULBACTAM <=2 SENSITIVE Sensitive     PIP/TAZO <=4 SENSITIVE Sensitive     * ESCHERICHIA COLI  Blood Culture ID Panel (Reflexed)     Status: Abnormal   Collection Time: 06/11/15  1:50 AM  Result Value Ref Range Status   Enterococcus species NOT DETECTED NOT DETECTED Final   Vancomycin resistance NOT DETECTED NOT DETECTED Final   Listeria monocytogenes NOT DETECTED NOT DETECTED Final   Staphylococcus species NOT DETECTED NOT DETECTED Final   Staphylococcus aureus NOT DETECTED NOT DETECTED Final   Methicillin resistance NOT DETECTED NOT DETECTED Final   Streptococcus species NOT DETECTED NOT DETECTED Final   Streptococcus agalactiae NOT DETECTED NOT DETECTED Final   Streptococcus pneumoniae NOT DETECTED NOT DETECTED Final   Streptococcus pyogenes NOT DETECTED NOT DETECTED Final   Acinetobacter baumannii NOT DETECTED NOT DETECTED Final   Enterobacteriaceae species NOT DETECTED NOT DETECTED Final   Enterobacter cloacae complex NOT DETECTED NOT DETECTED Final   Escherichia coli DETECTED (A) NOT DETECTED Final    Comment: CRITICAL RESULT CALLED TO, READ BACK BY AND VERIFIED WITH: A MEYER PHARMD 1737 06/11/15 A BROWNING    Klebsiella oxytoca NOT DETECTED NOT DETECTED Final   Klebsiella pneumoniae NOT DETECTED NOT DETECTED Final   Proteus species NOT DETECTED NOT DETECTED Final   Serratia marcescens NOT DETECTED NOT DETECTED Final   Carbapenem resistance NOT DETECTED NOT DETECTED Final   Haemophilus influenzae NOT DETECTED NOT DETECTED  Final   Neisseria meningitidis NOT DETECTED NOT DETECTED Final   Pseudomonas aeruginosa NOT DETECTED NOT DETECTED Final   Candida albicans NOT DETECTED NOT DETECTED Final   Candida glabrata NOT DETECTED NOT DETECTED Final   Candida krusei NOT DETECTED NOT DETECTED Final   Candida parapsilosis NOT DETECTED NOT DETECTED Final   Candida tropicalis NOT DETECTED NOT DETECTED Final  Urine culture     Status: Abnormal   Collection Time: 06/11/15  5:16 AM  Result Value Ref Range Status   Specimen Description URINE, CATHETERIZED  Final   Special Requests NONE  Final   Culture 70,000 COLONIES/mL ESCHERICHIA COLI (A)  Final   Report Status 06/13/2015 FINAL  Final   Organism ID, Bacteria ESCHERICHIA COLI (A)  Final      Susceptibility   Escherichia coli - MIC*    AMPICILLIN <=2 SENSITIVE Sensitive     CEFAZOLIN <=4 SENSITIVE Sensitive  CEFTRIAXONE <=1 SENSITIVE Sensitive     CIPROFLOXACIN <=0.25 SENSITIVE Sensitive     GENTAMICIN <=1 SENSITIVE Sensitive     IMIPENEM <=0.25 SENSITIVE Sensitive     NITROFURANTOIN <=16 SENSITIVE Sensitive     TRIMETH/SULFA <=20 SENSITIVE Sensitive     AMPICILLIN/SULBACTAM <=2 SENSITIVE Sensitive     PIP/TAZO <=4 SENSITIVE Sensitive     * 70,000 COLONIES/mL ESCHERICHIA COLI  MRSA PCR Screening     Status: None   Collection Time: 06/11/15  7:46 AM  Result Value Ref Range Status   MRSA by PCR NEGATIVE NEGATIVE Final    Comment:        The GeneXpert MRSA Assay (FDA approved for NASAL specimens only), is one component of a comprehensive MRSA colonization surveillance program. It is not intended to diagnose MRSA infection nor to guide or monitor treatment for MRSA infections.   Culture, sputum-assessment     Status: None   Collection Time: 06/12/15  8:20 AM  Result Value Ref Range Status   Specimen Description SPUTUM  Final   Special Requests NONE  Final   Sputum evaluation   Final    THIS SPECIMEN IS ACCEPTABLE. RESPIRATORY CULTURE REPORT TO FOLLOW.     Report Status 06/12/2015 FINAL  Final  Culture, respiratory (NON-Expectorated)     Status: None   Collection Time: 06/12/15  8:20 AM  Result Value Ref Range Status   Specimen Description SPUTUM  Final   Special Requests NONE  Final   Gram Stain   Final    MODERATE WBC PRESENT,BOTH PMN AND MONONUCLEAR RARE SQUAMOUS EPITHELIAL CELLS PRESENT NO ORGANISMS SEEN THIS SPECIMEN IS ACCEPTABLE FOR SPUTUM CULTURE Performed at [facility]    Culture   Final    NORMAL OROPHARYNGEAL FLORA Performed at [facility]    Report Status 06/15/2015 FINAL  Final  Culture, blood (routine x 2)     Status: None (Preliminary result)   Collection Time: 06/15/15  4:35 AM  Result Value Ref Range Status   Specimen Description BLOOD RIGHT HAND  Final   Special Requests IN PEDIATRIC BOTTLE 3CC  Final   Culture NO GROWTH 4 DAYS  Final   Report Status PENDING  Incomplete  Culture, blood (routine x 2)     Status: None (Preliminary result)   Collection Time: 06/15/15  4:41 AM  Result Value Ref Range Status   Specimen Description BLOOD LEFT ARM  Final   Special Requests IN PEDIATRIC BOTTLE 3CC  Final   Culture NO GROWTH 4 DAYS  Final   Report Status PENDING  Incomplete     Time spent in discharge (includes decision making & examination of pt): > 35 minutes  06/20/2015, 10:34 AM   Cherene Altes, MD [facility] Office  [phone] Pager [phone]  On-Call/Text Page:      [email or web address]      password [facility]

## 2015-06-21 ENCOUNTER — Ambulatory Visit: Payer: Medicare Other | Admitting: Physician Assistant

## 2015-06-21 NOTE — Care Management Note (Signed)
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 States pt has oxygen at home but wants light weight portable tank. Orders updated in system and DME rep Jermaine notified. Notified [place] Liaison that pt dc home with [place] RN, PT, OT. Pt has wheelchair at home. Lives at home with dtr. Contacted [facility] for delivery of portable tank for home. Pt home oxygen arranged with [facility].   Expected Discharge Date:  06/20/2015            Expected Discharge Plan:  [place]  In-House Referral:  NA  Discharge planning Services  CM Consult  Post Acute Care Choice:  Home Health, Resumption of Svcs/PTA Provider Choice offered to:  Adult Children  DME Arranged:  Oxygen DME Agency:  [place] Arranged:  PT, OT, RN [facility] Agency:  [place]  Status of Service:  Completed, signed off  Medicare Important Message Given:  Yes Date Medicare IM Given:    Medicare IM give by:    Date Additional Medicare IM Given:    Additional Medicare Important Message give by:     If discussed at [place] of Stay Meetings, dates discussed:    Additional Comments:  Erenest Rasher, RN 06/21/2015, 10:10 AM

## 2015-06-22 ENCOUNTER — Ambulatory Visit (INDEPENDENT_AMBULATORY_CARE_PROVIDER_SITE_OTHER): Payer: Medicare Other

## 2015-06-22 DIAGNOSIS — I5023 Acute on chronic systolic (congestive) heart failure: Secondary | ICD-10-CM

## 2015-06-22 DIAGNOSIS — Z95810 Presence of automatic (implantable) cardiac defibrillator: Secondary | ICD-10-CM

## 2015-06-22 NOTE — Progress Notes (Signed)
Remote ICD transmission.   

## 2015-06-22 NOTE — Progress Notes (Signed)
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 Gained more than 2 pounds in a day or more than 5 pounds in a week? no  2. Had changes in your medications (with verification of current medications)? no  3. Had more shortness of breath than is usual for you? no  4. Limited your activity because of shortness of breath? no  5. Not been able to sleep because of shortness of breath? no  6. Had increased swelling in your feet, ankles, legs or stomach area? no  7. Had symptoms of dehydration (dizziness, dry mouth, increased thirst, decreased urine output) no  8. Had changes in sodium restriction? no  9. Been compliant with medication? Yes  ICM trend: 3 month view for 06/22/2015   ICM trend: 1 year view for 06/22/2015   Direct Trend Viewer 05/30/2015 to 06/21/2015.                                       06-10-2015 Follow-up plan: ICM clinic phone appointment 07/05/2015.  Spoke with daughter, [name] ([place]).   FLUID LEVELS:  Corvue thoracic impedance decreased 06/10/2015 to 06/17/2015 suggesting fluid accumulation and returned to baseline 06/17/2015.  Impedance is above baseline which correlates with being diuresed during hospitalization for sepsis due to pneumonia from 06/11/2015 to 06/20/2015.   SYMPTOMS:  Since hospital discharge he is doing well.  He does feel a little weak and trying to get some strength back.     RECOMMENDATIONS:   Repeat transmission day before office appointment 07/05/2015 with with [name].    Advised will send to PCP, [name] and Dr Rayann Heman for review.    Rosalene Billings, RN, CCM 06/22/2015 12:43 PM

## 2015-06-25 DIAGNOSIS — I129 Hypertensive chronic kidney disease with stage 1 through stage 4 chronic kidney disease, or unspecified chronic kidney disease: Secondary | ICD-10-CM | POA: Diagnosis not present

## 2015-06-25 DIAGNOSIS — I252 Old myocardial infarction: Secondary | ICD-10-CM | POA: Diagnosis not present

## 2015-06-25 DIAGNOSIS — Z7982 Long term (current) use of aspirin: Secondary | ICD-10-CM | POA: Diagnosis not present

## 2015-06-25 DIAGNOSIS — I5022 Chronic systolic (congestive) heart failure: Secondary | ICD-10-CM | POA: Diagnosis not present

## 2015-06-25 DIAGNOSIS — Z8674 Personal history of sudden cardiac arrest: Secondary | ICD-10-CM | POA: Diagnosis not present

## 2015-06-25 DIAGNOSIS — I255 Ischemic cardiomyopathy: Secondary | ICD-10-CM | POA: Diagnosis not present

## 2015-06-25 DIAGNOSIS — Z87440 Personal history of urinary (tract) infections: Secondary | ICD-10-CM | POA: Diagnosis not present

## 2015-06-25 DIAGNOSIS — J209 Acute bronchitis, unspecified: Secondary | ICD-10-CM | POA: Diagnosis not present

## 2015-06-25 DIAGNOSIS — D649 Anemia, unspecified: Secondary | ICD-10-CM | POA: Diagnosis not present

## 2015-06-25 DIAGNOSIS — J440 Chronic obstructive pulmonary disease with acute lower respiratory infection: Secondary | ICD-10-CM | POA: Diagnosis not present

## 2015-06-25 DIAGNOSIS — I2510 Atherosclerotic heart disease of native coronary artery without angina pectoris: Secondary | ICD-10-CM | POA: Diagnosis not present

## 2015-06-25 DIAGNOSIS — R339 Retention of urine, unspecified: Secondary | ICD-10-CM | POA: Diagnosis not present

## 2015-06-25 DIAGNOSIS — Z95810 Presence of automatic (implantable) cardiac defibrillator: Secondary | ICD-10-CM | POA: Diagnosis not present

## 2015-06-25 DIAGNOSIS — E785 Hyperlipidemia, unspecified: Secondary | ICD-10-CM | POA: Diagnosis not present

## 2015-06-25 DIAGNOSIS — K5790 Diverticulosis of intestine, part unspecified, without perforation or abscess without bleeding: Secondary | ICD-10-CM | POA: Diagnosis not present

## 2015-06-25 DIAGNOSIS — E1122 Type 2 diabetes mellitus with diabetic chronic kidney disease: Secondary | ICD-10-CM | POA: Diagnosis not present

## 2015-06-25 DIAGNOSIS — J441 Chronic obstructive pulmonary disease with (acute) exacerbation: Secondary | ICD-10-CM | POA: Diagnosis not present

## 2015-06-25 DIAGNOSIS — R1314 Dysphagia, pharyngoesophageal phase: Secondary | ICD-10-CM | POA: Diagnosis not present

## 2015-06-25 DIAGNOSIS — Z87891 Personal history of nicotine dependence: Secondary | ICD-10-CM | POA: Diagnosis not present

## 2015-06-25 DIAGNOSIS — N183 Chronic kidney disease, stage 3 (moderate): Secondary | ICD-10-CM | POA: Diagnosis not present

## 2015-06-27 DIAGNOSIS — J209 Acute bronchitis, unspecified: Secondary | ICD-10-CM | POA: Diagnosis not present

## 2015-06-27 DIAGNOSIS — J9601 Acute respiratory failure with hypoxia: Secondary | ICD-10-CM | POA: Diagnosis not present

## 2015-06-27 DIAGNOSIS — J441 Chronic obstructive pulmonary disease with (acute) exacerbation: Secondary | ICD-10-CM | POA: Diagnosis not present

## 2015-06-27 DIAGNOSIS — N390 Urinary tract infection, site not specified: Secondary | ICD-10-CM | POA: Diagnosis not present

## 2015-06-28 DIAGNOSIS — I5022 Chronic systolic (congestive) heart failure: Secondary | ICD-10-CM | POA: Diagnosis not present

## 2015-06-28 DIAGNOSIS — E1122 Type 2 diabetes mellitus with diabetic chronic kidney disease: Secondary | ICD-10-CM | POA: Diagnosis not present

## 2015-06-28 DIAGNOSIS — J441 Chronic obstructive pulmonary disease with (acute) exacerbation: Secondary | ICD-10-CM | POA: Diagnosis not present

## 2015-06-28 DIAGNOSIS — N390 Urinary tract infection, site not specified: Secondary | ICD-10-CM | POA: Diagnosis not present

## 2015-06-28 DIAGNOSIS — I2510 Atherosclerotic heart disease of native coronary artery without angina pectoris: Secondary | ICD-10-CM | POA: Diagnosis not present

## 2015-06-28 DIAGNOSIS — J440 Chronic obstructive pulmonary disease with acute lower respiratory infection: Secondary | ICD-10-CM | POA: Diagnosis not present

## 2015-06-28 DIAGNOSIS — J209 Acute bronchitis, unspecified: Secondary | ICD-10-CM | POA: Diagnosis not present

## 2015-06-29 DIAGNOSIS — J441 Chronic obstructive pulmonary disease with (acute) exacerbation: Secondary | ICD-10-CM | POA: Diagnosis not present

## 2015-06-29 DIAGNOSIS — J440 Chronic obstructive pulmonary disease with acute lower respiratory infection: Secondary | ICD-10-CM | POA: Diagnosis not present

## 2015-06-29 DIAGNOSIS — I5022 Chronic systolic (congestive) heart failure: Secondary | ICD-10-CM | POA: Diagnosis not present

## 2015-06-29 DIAGNOSIS — E1122 Type 2 diabetes mellitus with diabetic chronic kidney disease: Secondary | ICD-10-CM | POA: Diagnosis not present

## 2015-06-29 DIAGNOSIS — I2510 Atherosclerotic heart disease of native coronary artery without angina pectoris: Secondary | ICD-10-CM | POA: Diagnosis not present

## 2015-06-29 DIAGNOSIS — J209 Acute bronchitis, unspecified: Secondary | ICD-10-CM | POA: Diagnosis not present

## 2015-07-03 DIAGNOSIS — J441 Chronic obstructive pulmonary disease with (acute) exacerbation: Secondary | ICD-10-CM | POA: Diagnosis not present

## 2015-07-03 DIAGNOSIS — I2510 Atherosclerotic heart disease of native coronary artery without angina pectoris: Secondary | ICD-10-CM | POA: Diagnosis not present

## 2015-07-03 DIAGNOSIS — E1122 Type 2 diabetes mellitus with diabetic chronic kidney disease: Secondary | ICD-10-CM | POA: Diagnosis not present

## 2015-07-03 DIAGNOSIS — J440 Chronic obstructive pulmonary disease with acute lower respiratory infection: Secondary | ICD-10-CM | POA: Diagnosis not present

## 2015-07-03 DIAGNOSIS — I5022 Chronic systolic (congestive) heart failure: Secondary | ICD-10-CM | POA: Diagnosis not present

## 2015-07-03 DIAGNOSIS — J209 Acute bronchitis, unspecified: Secondary | ICD-10-CM | POA: Diagnosis not present

## 2015-07-05 ENCOUNTER — Ambulatory Visit (INDEPENDENT_AMBULATORY_CARE_PROVIDER_SITE_OTHER): Payer: Medicare Other

## 2015-07-05 ENCOUNTER — Telehealth: Payer: Self-pay

## 2015-07-05 DIAGNOSIS — I5022 Chronic systolic (congestive) heart failure: Secondary | ICD-10-CM | POA: Diagnosis not present

## 2015-07-05 DIAGNOSIS — Z95810 Presence of automatic (implantable) cardiac defibrillator: Secondary | ICD-10-CM

## 2015-07-05 DIAGNOSIS — I2510 Atherosclerotic heart disease of native coronary artery without angina pectoris: Secondary | ICD-10-CM | POA: Diagnosis not present

## 2015-07-05 DIAGNOSIS — E1122 Type 2 diabetes mellitus with diabetic chronic kidney disease: Secondary | ICD-10-CM | POA: Diagnosis not present

## 2015-07-05 DIAGNOSIS — J440 Chronic obstructive pulmonary disease with acute lower respiratory infection: Secondary | ICD-10-CM | POA: Diagnosis not present

## 2015-07-05 DIAGNOSIS — J209 Acute bronchitis, unspecified: Secondary | ICD-10-CM | POA: Diagnosis not present

## 2015-07-05 DIAGNOSIS — I5023 Acute on chronic systolic (congestive) heart failure: Secondary | ICD-10-CM

## 2015-07-05 DIAGNOSIS — J441 Chronic obstructive pulmonary disease with (acute) exacerbation: Secondary | ICD-10-CM | POA: Diagnosis not present

## 2015-07-05 NOTE — Progress Notes (Signed)
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 Gained more than 2 pounds in a day or more than 5 pounds in a week? unknown  2. Had changes in your medications (with verification of current medications)?  Not taking Furosemide at this time as instructed at last office visit, was only prescribed for 3 days.   3. Had more shortness of breath than is usual for you? no  4. Limited your activity because of shortness of breath? no  5. Not been able to sleep because of shortness of breath? no  6. Had increased swelling in your feet, ankles, legs or stomach area? no  7. Had symptoms of dehydration (dizziness, dry mouth, increased thirst, decreased urine output) no  8. Had changes in sodium restriction? no  9. Been compliant with medication? Yes  ICM trend: 3 month view for 07/05/2015  ICM trend: 1 year view for 07/05/2015   Follow-up plan: ICM clinic phone appointment 08/09/2015.  Spoke with daughter [name].   FLUID LEVELS: Corvue thoracic impedance decreased 06/26/2015 to 07/03/2015 suggesting fluid accumulation and returned to baseline on 07/03/2015.    SYMPTOMS:   She does not think he is currently having any fluid symptoms such as weight gain of 3 pounds overnight or 5 pounds within a week, SOB and/or lower extremity swelling.   She stated he remains weak and has not seemed to return to his baseline since last hospitalization.  He has an appointment with [name] on 07/06/2015.     EDUCATION:   She stated patient eats out most of the time.  Advised high sodium diet can cause fluid retention and should limit sodium intake to < 2000 mg and fluid intake to 64 oz daily.   She reported he will not follow low sodium diet.    RECOMMENDATIONS: No changes today.    Advised will send to  Dr Rayann Heman and [name] since patient has office visit on 07/06/2015.    Rosalene Billings, RN, CCM 07/05/2015 10:26 AM

## 2015-07-05 NOTE — Telephone Encounter (Signed)
Call to daughter [name].  Requested to have patient send a manual ICM remote transmission.

## 2015-07-06 ENCOUNTER — Encounter: Payer: Self-pay | Admitting: Cardiovascular Disease

## 2015-07-06 ENCOUNTER — Ambulatory Visit (INDEPENDENT_AMBULATORY_CARE_PROVIDER_SITE_OTHER): Payer: Medicare Other | Admitting: Cardiovascular Disease

## 2015-07-06 VITALS — BP 100/50 | HR 72 | Ht 70.0 in | Wt 168.1 lb

## 2015-07-06 DIAGNOSIS — I5022 Chronic systolic (congestive) heart failure: Secondary | ICD-10-CM | POA: Diagnosis not present

## 2015-07-06 DIAGNOSIS — I255 Ischemic cardiomyopathy: Secondary | ICD-10-CM

## 2015-07-06 NOTE — Patient Instructions (Addendum)
Medication Instructions:  Your physician recommends that you continue on your current medications as directed. Please refer to the Current Medication list given to you today.  Labwork: NONE  Testing/Procedures: NONE  Follow-Up: Your physician wants you to follow-up in: 3 months with Dr. Johnsie Cancel. You will receive a reminder letter in the mail two months in advance. If you don't receive a letter, please call our office to schedule the follow-up appointment.   If you need a refill on your cardiac medications before your next appointment, please call your pharmacy.

## 2015-07-06 NOTE — Progress Notes (Signed)
Patient ID: [name] Azure, MD, male   DOB: [birth year], 82 y.o.   MRN: [id number]    Cardiology Office Note    Date:  07/06/2015   ID:  [name] Azure, MD, DOB [birth year], MRN [id number]  PCP:  [name], MD  Cardiologist: Dr. Johnsie Cancel Dr. 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   In 2011, he survived a prolonged out of hospital cardiac arrest with ventricular fibrillation. He was found to have CAD with stents in RCA/circumflex and RI. EF 31% with large area of scar by MRI. St Jude AICD subsequently placed. He has not been hospitalized for his heart since then with no AICD d/c and no clinical CHF. Last 2D ECHO in 2015 showed EF 45-50%.   Seen 06/06/15 in office with concern for pneumonia  Cough and dyspnea. Always has mechanical breathing issues CXR with mild CE no penumonia 06/14/15 Barrium Swallow Presbyesophagus  Latter admitted with pyelonephritis, sepsis with elevated lactate levels. ASA stopped due to hematuria Since d/c slow improvement.   Coreview valvues elevated 5/26-6/6 Given lasix.   Lab Results  Component Value Date   CREATININE 1.24 06/19/2015   BUN 36* 06/19/2015   NA 139 06/19/2015   K 4.7 06/19/2015   CL 99* 06/19/2015   CO2 31 06/19/2015    BNP    Component Value Date/Time   BNP 114.2* 06/11/2015 0145   BNP 45.9 06/06/2015 1629    ProBNP    Component Value Date/Time   PROBNP 759.3* 11/11/2013 2139      Past Medical History  Diagnosis Date  . CAD (coronary artery disease)   . Ischemic cardiomyopathy   . CHF (congestive heart failure) ([place])   . Dyslipidemia   . Diverticulosis of colon (without mention of hemorrhage)   . Diabetes mellitus   . Anemia,  unspecified   . Unspecified disorder resulting from impaired renal function   . Chronic airway obstruction, not elsewhere classified     on o2 at night  . Prostatic hypertrophy     hx of  . Retention of urine, unspecified   . ICD (implantable cardiac defibrillator) in place     st jude  . Emphysema   . Hemorrhoids   . MI, old 2010 or 2011  . Macular degeneration   . Cataracts, bilateral     Past Surgical History  Procedure Laterality Date  . Pci      4/11  . Cardiac defibrillator placement  2011    st jude  . Pacemaker insertion  2011  . Tonsillectomy  82 yo  . Cataract extraction      Current Medications: Outpatient Prescriptions Prior to Visit  Medication Sig Dispense Refill  . albuterol (PROVENTIL) (2.5 MG/3ML) 0.083% nebulizer solution Take 2.5 mg by nebulization every 4 (four) hours as needed for wheezing or shortness of breath.    . ALPRAZolam (XANAX) 0.25 MG tablet Take 0.25 mg by mouth at bedtime.     Marland Kitchen aluminum hydroxide-magnesium carbonate (GAVISCON) 95-358 MG/15ML SUSP Take 30 mLs by mouth 3 (three) times daily after meals.  0  . arformoterol (BROVANA) 15 MCG/2ML NEBU Take 2 mLs (15 mcg total) by nebulization 2 (two) times  daily. 120 mL 3  . budesonide (PULMICORT) 0.5 MG/2ML nebulizer solution Take 2 mLs (0.5 mg total) by nebulization 2 (two) times daily. 120 mL 3  . clopidogrel (PLAVIX) 75 MG tablet Take 75 mg by mouth daily.    . Hydrocodone-Acetaminophen 5-300 MG TABS Take 1 tablet by mouth every 6 (six) hours as needed (pain).    Marland Kitchen losartan (COZAAR) 25 MG tablet Take 25 mg by mouth daily after supper.     . metoprolol tartrate (LOPRESSOR) 25 MG tablet Take 12.5 mg by mouth 2 (two) times daily. Reported on 06/06/2015    . nitroGLYCERIN (NITROSTAT) 0.4 MG SL tablet Place 0.4 mg under the tongue every 5 (five) minutes as needed for chest pain. UP TO 3 DOSES THEN CALL EMS    . OXYGEN Inhale into the lungs at bedtime.    . rosuvastatin (CRESTOR) 5 MG tablet Take 5 mg  by mouth daily.    . vitamin C (ASCORBIC ACID) 500 MG tablet Take 500 mg by mouth daily.    Marland Kitchen aspirin EC 81 MG tablet Take 1 tablet (81 mg total) by mouth at bedtime. (Patient not taking: Reported on 07/06/2015) 30 tablet 0  . furosemide (LASIX) 20 MG tablet Take 1 tablet (20 mg total) by mouth daily. (Patient not taking: Reported on 07/06/2015) 30 tablet 0  . cefUROXime (CEFTIN) 500 MG tablet Take 1 tablet (500 mg total) by mouth 2 (two) times daily with a meal. 8 tablet 0  . Cyanocobalamin (VITAMIN B-12) 5000 MCG TBDP Take 5,000 mcg by mouth daily.    . ferrous sulfate 325 (65 FE) MG tablet Take 325 mg by mouth daily with breakfast.    . guaiFENesin (MUCINEX) 600 MG 12 hr tablet Take 600 mg by mouth 2 (two) times daily. Take for 7 days - starting 06/08/15    . meloxicam (MOBIC) 7.5 MG tablet Take 7.5 mg by mouth daily as needed for pain.    . methocarbamol (ROBAXIN) 500 MG tablet Take 500 mg by mouth 2 (two) times daily as needed for muscle spasms.    . niacin 250 MG tablet Take 250 mg by mouth daily.     No facility-administered medications prior to visit.     Allergies:   Review of patient's allergies indicates no known allergies.   Social History   Social History  . Marital Status: Married    Spouse Name: N/A  . Number of Children: N/A  . Years of Education: N/A   Occupational History  . dentist    Social History Main Topics  . Smoking status: Former Smoker    Types: Cigarettes    Quit date: 04/27/2009  . Smokeless tobacco: Never Used  . Alcohol Use: Yes     Comment: occasional beer  . Drug Use: No  . Sexual Activity: No   Other Topics Concern  . None   Social History Narrative   Lives in a house with stairs, which he needs to use, with his wife.  Wife is not able-bodied.  Does not use a cane or walker, but is unsteady on his feet.        [name]:  [phone] cell, [phone], daughter, POA.     Boyd [id number]  [phone], nephew   Juluis [id number]   [phone], daughter              Family History:  The patient's family history includes Atrial fibrillation in his daughter; Breast cancer in his sister; Congestive Heart Failure in  his mother; Coronary artery disease in his mother; Heart attack in his brother; Lung cancer in his father; Stroke in his mother.   ROS:   Please see the history of present illness.    ROS All other systems reviewed and are negative.   PHYSICAL EXAM:   VS:  BP 100/50 mmHg  Pulse 72  Ht 5\' 10"  (1.778 m)  Wt 76.259 kg (168 lb 1.9 oz)  BMI 24.12 kg/m2  SpO2 93%   Elderly retired Pharmacist, community  HEENT: normal Neck: no JVD, carotid bruits, or masses Cardiac: RRR; no murmurs, rubs, or gallops,no edema  AICD under left clavicle  Respiratory:  Diffuse rhonchi, no wheezing.  normal work of breathing GI: soft, nontender, nondistended, + BS MS: no deformity or atrophy Skin: warm and dry, no rash Neuro:  Alert and Oriented x 3, Strength and sensation are intact Psych: euthymic mood, full affect  Wt Readings from Last 3 Encounters:  07/06/15 76.259 kg (168 lb 1.9 oz)  06/19/15 75.116 kg (165 lb 9.6 oz)  06/06/15 81.647 kg (180 lb)      Studies/Labs Reviewed:   EKG:  EKG is ordered today.  NSR with PAC HR 77  Recent Labs: 06/11/2015: B Natriuretic Peptide 114.2* 06/18/2015: ALT 30; Magnesium 2.0 06/19/2015: BUN 36*; Creatinine, Ser 1.24; Hemoglobin 9.8*; Platelets 74*; Potassium 4.7; Sodium 139   Lipid Panel No results found for: CHOL, TRIG, HDL, CHOLHDL, VLDL, LDLCALC, LDLDIRECT  Additional studies/ records that were reviewed today include:  LHC in 2011: PCI/DES to RI, dLCx and RCA.   2D ECHO: 11/15/2013 LV EF: 45% -  50% Study Conclusions - Left ventricle: Distal septal and apical hypokinesis. The cavity size was mildly dilated. Systolic function was mildly reduced. The estimated ejection fraction was in the range of 45% to 50%. - Aortic valve: Small systolic gradient without  significant stenosis Valve area (VTI): 1.8 cm^2. Valve area (Vmax): 1.72 cm^2. - Mitral valve: Calcified annulus. Mildly thickened leaflets . - Left atrium: The atrium was mildly dilated. - Atrial septum: No defect or patent foramen ovale was identified. - Pulmonary arteries: PA peak pressure: 40 mm Hg (S).  ASSESSMENT:    1. Chronic systolic CHF (congestive heart failure) (HCC)      PLAN:  In order of problems listed above:  Dyspnea:  Improved CHF euvolemic continue current dose diuretic His impedence drops don't Always correlate with elevated BNP. Told him to take lasix when he is dyspnic and transmit Reading from CRT/AICD.    CAD s/p DES to RI, dLCX and RCA (2011):  Asa held . Continue BB and statin.   H/O VF cardiac arrest: s/p St. Jude ICd followed by Dr. Rayann Heman.  HLD: continue statin.   COPD: stable. followed by Dr Melvyn Novas  CKD: at baseline will not over diurese  Major issue that lead to bacteremia is his self cathing with foley Which is occasionally traumatic.  Discussed with Dr Gaynelle [facility] He will see patient Tuesday I would suggest either suppressive antibiotic Rx with bactrim or macrodantin or suprapubic catheter so Patient doesn't have to manipulate urethra himself. Given CAD important to restart ASA ASAP but cannot While post nephritis hematuria continues  Above complex issues discussed with daughter [name] who will be with Dr [name] when he sees urology   Jenkins Rouge

## 2015-07-09 DIAGNOSIS — I5022 Chronic systolic (congestive) heart failure: Secondary | ICD-10-CM | POA: Diagnosis not present

## 2015-07-09 DIAGNOSIS — J209 Acute bronchitis, unspecified: Secondary | ICD-10-CM | POA: Diagnosis not present

## 2015-07-09 DIAGNOSIS — I2510 Atherosclerotic heart disease of native coronary artery without angina pectoris: Secondary | ICD-10-CM | POA: Diagnosis not present

## 2015-07-09 DIAGNOSIS — E1122 Type 2 diabetes mellitus with diabetic chronic kidney disease: Secondary | ICD-10-CM | POA: Diagnosis not present

## 2015-07-09 DIAGNOSIS — J440 Chronic obstructive pulmonary disease with acute lower respiratory infection: Secondary | ICD-10-CM | POA: Diagnosis not present

## 2015-07-09 DIAGNOSIS — J441 Chronic obstructive pulmonary disease with (acute) exacerbation: Secondary | ICD-10-CM | POA: Diagnosis not present

## 2015-07-09 LAB — CUP PACEART REMOTE DEVICE CHECK
Date Time Interrogation Session: 20170612150834
HighPow Impedance: 77 Ohm
Implantable Lead Implant Date: 20111117
Implantable Lead Implant Date: 20111117
Implantable Lead Location: 753860
Implantable Lead Model: 7122
Lead Channel Impedance Value: 390 Ohm
Lead Channel Impedance Value: 400 Ohm
Lead Channel Sensing Intrinsic Amplitude: 11.8 mV
Lead Channel Sensing Intrinsic Amplitude: 4.9 mV
Lead Channel Setting Pacing Amplitude: 2 V
MDC IDC LEAD LOCATION: 753859
MDC IDC PG SERIAL: 623396
MDC IDC SET LEADCHNL RV PACING AMPLITUDE: 2.5 V
MDC IDC SET LEADCHNL RV PACING PULSEWIDTH: 0.5 ms
MDC IDC SET LEADCHNL RV SENSING SENSITIVITY: 0.5 mV
MDC IDC STAT BRADY RA PERCENT PACED: 1 % — AB
MDC IDC STAT BRADY RV PERCENT PACED: 1 % — AB

## 2015-07-11 DIAGNOSIS — R310 Gross hematuria: Secondary | ICD-10-CM | POA: Diagnosis not present

## 2015-07-12 DIAGNOSIS — J209 Acute bronchitis, unspecified: Secondary | ICD-10-CM | POA: Diagnosis not present

## 2015-07-12 DIAGNOSIS — I2510 Atherosclerotic heart disease of native coronary artery without angina pectoris: Secondary | ICD-10-CM | POA: Diagnosis not present

## 2015-07-12 DIAGNOSIS — I5022 Chronic systolic (congestive) heart failure: Secondary | ICD-10-CM | POA: Diagnosis not present

## 2015-07-12 DIAGNOSIS — E1122 Type 2 diabetes mellitus with diabetic chronic kidney disease: Secondary | ICD-10-CM | POA: Diagnosis not present

## 2015-07-12 DIAGNOSIS — J441 Chronic obstructive pulmonary disease with (acute) exacerbation: Secondary | ICD-10-CM | POA: Diagnosis not present

## 2015-07-12 DIAGNOSIS — J440 Chronic obstructive pulmonary disease with acute lower respiratory infection: Secondary | ICD-10-CM | POA: Diagnosis not present

## 2015-07-16 DIAGNOSIS — J209 Acute bronchitis, unspecified: Secondary | ICD-10-CM | POA: Diagnosis not present

## 2015-07-16 DIAGNOSIS — E1122 Type 2 diabetes mellitus with diabetic chronic kidney disease: Secondary | ICD-10-CM | POA: Diagnosis not present

## 2015-07-16 DIAGNOSIS — I2510 Atherosclerotic heart disease of native coronary artery without angina pectoris: Secondary | ICD-10-CM | POA: Diagnosis not present

## 2015-07-16 DIAGNOSIS — I5022 Chronic systolic (congestive) heart failure: Secondary | ICD-10-CM | POA: Diagnosis not present

## 2015-07-16 DIAGNOSIS — J441 Chronic obstructive pulmonary disease with (acute) exacerbation: Secondary | ICD-10-CM | POA: Diagnosis not present

## 2015-07-16 DIAGNOSIS — J440 Chronic obstructive pulmonary disease with acute lower respiratory infection: Secondary | ICD-10-CM | POA: Diagnosis not present

## 2015-07-17 ENCOUNTER — Encounter: Payer: Self-pay | Admitting: Cardiology

## 2015-07-17 DIAGNOSIS — J440 Chronic obstructive pulmonary disease with acute lower respiratory infection: Secondary | ICD-10-CM | POA: Diagnosis not present

## 2015-07-17 DIAGNOSIS — J441 Chronic obstructive pulmonary disease with (acute) exacerbation: Secondary | ICD-10-CM | POA: Diagnosis not present

## 2015-07-17 DIAGNOSIS — E1122 Type 2 diabetes mellitus with diabetic chronic kidney disease: Secondary | ICD-10-CM | POA: Diagnosis not present

## 2015-07-17 DIAGNOSIS — I5022 Chronic systolic (congestive) heart failure: Secondary | ICD-10-CM | POA: Diagnosis not present

## 2015-07-17 DIAGNOSIS — I2510 Atherosclerotic heart disease of native coronary artery without angina pectoris: Secondary | ICD-10-CM | POA: Diagnosis not present

## 2015-07-17 DIAGNOSIS — J209 Acute bronchitis, unspecified: Secondary | ICD-10-CM | POA: Diagnosis not present

## 2015-07-18 ENCOUNTER — Telehealth: Payer: Self-pay | Admitting: Adult Health

## 2015-07-18 DIAGNOSIS — N281 Cyst of kidney, acquired: Secondary | ICD-10-CM | POA: Diagnosis not present

## 2015-07-18 DIAGNOSIS — R310 Gross hematuria: Secondary | ICD-10-CM | POA: Diagnosis not present

## 2015-07-18 NOTE — Telephone Encounter (Signed)
We received a refill request for Budesonide and Brovana from [facility]. I called [facility] and explained that the patient had not been seen here in our office since 05/27/2013 and the PCP did the last refill per phone note 01/30/2014. They will update their records and send refill to PCP's office

## 2015-07-19 DIAGNOSIS — J209 Acute bronchitis, unspecified: Secondary | ICD-10-CM | POA: Diagnosis not present

## 2015-07-19 DIAGNOSIS — I2510 Atherosclerotic heart disease of native coronary artery without angina pectoris: Secondary | ICD-10-CM | POA: Diagnosis not present

## 2015-07-19 DIAGNOSIS — J440 Chronic obstructive pulmonary disease with acute lower respiratory infection: Secondary | ICD-10-CM | POA: Diagnosis not present

## 2015-07-19 DIAGNOSIS — I5022 Chronic systolic (congestive) heart failure: Secondary | ICD-10-CM | POA: Diagnosis not present

## 2015-07-19 DIAGNOSIS — E1122 Type 2 diabetes mellitus with diabetic chronic kidney disease: Secondary | ICD-10-CM | POA: Diagnosis not present

## 2015-07-19 DIAGNOSIS — J441 Chronic obstructive pulmonary disease with (acute) exacerbation: Secondary | ICD-10-CM | POA: Diagnosis not present

## 2015-07-24 DIAGNOSIS — J209 Acute bronchitis, unspecified: Secondary | ICD-10-CM | POA: Diagnosis not present

## 2015-07-24 DIAGNOSIS — E1122 Type 2 diabetes mellitus with diabetic chronic kidney disease: Secondary | ICD-10-CM | POA: Diagnosis not present

## 2015-07-24 DIAGNOSIS — I2510 Atherosclerotic heart disease of native coronary artery without angina pectoris: Secondary | ICD-10-CM | POA: Diagnosis not present

## 2015-07-24 DIAGNOSIS — I5022 Chronic systolic (congestive) heart failure: Secondary | ICD-10-CM | POA: Diagnosis not present

## 2015-07-24 DIAGNOSIS — J441 Chronic obstructive pulmonary disease with (acute) exacerbation: Secondary | ICD-10-CM | POA: Diagnosis not present

## 2015-07-24 DIAGNOSIS — J440 Chronic obstructive pulmonary disease with acute lower respiratory infection: Secondary | ICD-10-CM | POA: Diagnosis not present

## 2015-07-26 DIAGNOSIS — E1122 Type 2 diabetes mellitus with diabetic chronic kidney disease: Secondary | ICD-10-CM | POA: Diagnosis not present

## 2015-07-26 DIAGNOSIS — J209 Acute bronchitis, unspecified: Secondary | ICD-10-CM | POA: Diagnosis not present

## 2015-07-26 DIAGNOSIS — J441 Chronic obstructive pulmonary disease with (acute) exacerbation: Secondary | ICD-10-CM | POA: Diagnosis not present

## 2015-07-26 DIAGNOSIS — I2510 Atherosclerotic heart disease of native coronary artery without angina pectoris: Secondary | ICD-10-CM | POA: Diagnosis not present

## 2015-07-26 DIAGNOSIS — I5022 Chronic systolic (congestive) heart failure: Secondary | ICD-10-CM | POA: Diagnosis not present

## 2015-07-26 DIAGNOSIS — J440 Chronic obstructive pulmonary disease with acute lower respiratory infection: Secondary | ICD-10-CM | POA: Diagnosis not present

## 2015-08-08 DIAGNOSIS — R339 Retention of urine, unspecified: Secondary | ICD-10-CM | POA: Diagnosis not present

## 2015-08-08 DIAGNOSIS — N401 Enlarged prostate with lower urinary tract symptoms: Secondary | ICD-10-CM | POA: Diagnosis not present

## 2015-08-08 DIAGNOSIS — R310 Gross hematuria: Secondary | ICD-10-CM | POA: Diagnosis not present

## 2015-08-08 DIAGNOSIS — R350 Frequency of micturition: Secondary | ICD-10-CM | POA: Diagnosis not present

## 2015-08-09 ENCOUNTER — Ambulatory Visit (INDEPENDENT_AMBULATORY_CARE_PROVIDER_SITE_OTHER): Payer: Medicare Other

## 2015-08-09 DIAGNOSIS — I5022 Chronic systolic (congestive) heart failure: Secondary | ICD-10-CM | POA: Diagnosis not present

## 2015-08-09 DIAGNOSIS — Z95810 Presence of automatic (implantable) cardiac defibrillator: Secondary | ICD-10-CM | POA: Diagnosis not present

## 2015-08-10 NOTE — Progress Notes (Signed)
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  Heart Failure questions reviewed, pt asymptomatic  Thoracic impedence stable.  Recommendations: No changes.  Low sodium diet education provided   ICM trend: 08/09/2015    Follow-up plan: ICM clinic phone appointment on 09/25/2015.  Copy of ICM check sent to device physician.   Rosalene Billings, RN 08/10/2015 1:12 PM

## 2015-09-25 ENCOUNTER — Ambulatory Visit (INDEPENDENT_AMBULATORY_CARE_PROVIDER_SITE_OTHER): Payer: Medicare Other | Admitting: *Deleted

## 2015-09-25 DIAGNOSIS — I5022 Chronic systolic (congestive) heart failure: Secondary | ICD-10-CM | POA: Diagnosis not present

## 2015-09-25 DIAGNOSIS — I255 Ischemic cardiomyopathy: Secondary | ICD-10-CM

## 2015-09-25 DIAGNOSIS — Z95810 Presence of automatic (implantable) cardiac defibrillator: Secondary | ICD-10-CM

## 2015-09-25 NOTE — Progress Notes (Signed)
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  Heart Failure questions reviewed, pt asymptomatic   Thoracic impedance normal.  Recommendations: No changes.  Low sodium diet education provided.    Follow-up plan: ICM clinic phone appointment on 10/30/2015.  Copy of ICM check sent to device physician.   ICM trend: 09/25/2015       Rosalene Billings, RN 09/25/2015 1:57 PM

## 2015-09-26 NOTE — Progress Notes (Signed)
Remote ICD transmission.   

## 2015-09-27 ENCOUNTER — Encounter: Payer: Self-pay | Admitting: Cardiology

## 2015-10-08 LAB — CUP PACEART REMOTE DEVICE CHECK
Battery Remaining Percentage: 48 %
Brady Statistic RA Percent Paced: 1 % — CL
Brady Statistic RV Percent Paced: 1 % — CL
Date Time Interrogation Session: 20170911101311
HIGH POWER IMPEDANCE MEASURED VALUE: 72 Ohm
Implantable Lead Location: 753860
Implantable Lead Model: 7122
Lead Channel Impedance Value: 410 Ohm
Lead Channel Sensing Intrinsic Amplitude: 11.9 mV
Lead Channel Setting Pacing Amplitude: 2 V
Lead Channel Setting Pacing Amplitude: 2.5 V
Lead Channel Setting Pacing Pulse Width: 0.5 ms
Lead Channel Setting Sensing Sensitivity: 0.5 mV
MDC IDC LEAD IMPLANT DT: 20111117
MDC IDC LEAD IMPLANT DT: 20111117
MDC IDC LEAD LOCATION: 753859
MDC IDC MSMT BATTERY REMAINING LONGEVITY: 51 mo
MDC IDC MSMT LEADCHNL RA SENSING INTR AMPL: 2.8 mV
MDC IDC MSMT LEADCHNL RV IMPEDANCE VALUE: 380 Ohm
MDC IDC PG SERIAL: 623396

## 2015-10-10 ENCOUNTER — Ambulatory Visit (INDEPENDENT_AMBULATORY_CARE_PROVIDER_SITE_OTHER): Payer: Medicare Other | Admitting: Nurse Practitioner

## 2015-10-10 ENCOUNTER — Encounter: Payer: Self-pay | Admitting: Nurse Practitioner

## 2015-10-10 ENCOUNTER — Other Ambulatory Visit: Payer: Self-pay | Admitting: Cardiovascular Disease

## 2015-10-10 ENCOUNTER — Encounter (INDEPENDENT_AMBULATORY_CARE_PROVIDER_SITE_OTHER): Payer: Self-pay

## 2015-10-10 VITALS — BP 118/70 | HR 59 | Wt 173.0 lb

## 2015-10-10 DIAGNOSIS — J449 Chronic obstructive pulmonary disease, unspecified: Secondary | ICD-10-CM

## 2015-10-10 DIAGNOSIS — Z95810 Presence of automatic (implantable) cardiac defibrillator: Secondary | ICD-10-CM

## 2015-10-10 DIAGNOSIS — N189 Chronic kidney disease, unspecified: Secondary | ICD-10-CM

## 2015-10-10 DIAGNOSIS — I5022 Chronic systolic (congestive) heart failure: Secondary | ICD-10-CM | POA: Diagnosis not present

## 2015-10-10 DIAGNOSIS — I255 Ischemic cardiomyopathy: Secondary | ICD-10-CM

## 2015-10-10 DIAGNOSIS — I2589 Other forms of chronic ischemic heart disease: Secondary | ICD-10-CM

## 2015-10-10 MED ORDER — ROSUVASTATIN CALCIUM 5 MG PO TABS: 5.0000 mg | ORAL_TABLET | Freq: Every day | ORAL | 3 refills | Status: DC

## 2015-10-10 NOTE — Patient Instructions (Addendum)
We will be checking the following labs today - NONE   Medication Instructions:    Continue with your current medicines.     Testing/Procedures To Be Arranged:  N/A  Follow-Up:   See Dr. Johnsie Cancel in 3 to 4 months    Other Special Instructions:   N/A    If you need a refill on your cardiac medications before your next appointment, please call your pharmacy.   Call the [place] office at [phone] if you have any questions, problems or concerns.

## 2015-10-10 NOTE — Progress Notes (Signed)
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 Cardiomyopathy  . Coronary Artery Disease    3 month check - seen for Dr. 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 Seen for Dr. Johnsie Cancel. I have taken care of both his wife and daughter in the past. His wife passed last year.   He has a history of CAD s/p DES to RI, dLCx, RCA (2011), ICM/Chronic systolic CHF, cardiac arrest w/ ventricular fibrillation s/p ICD (2011), HLD, COPD on 2.5 L 02 QHS and CKD.   In 2011, he survived a prolonged out of hospital cardiac arrest with ventricular fibrillation. He was found to have CAD with stents in RCA/circumflex and RI. EF 31% with large area of scar by MRI. St Jude AICD subsequently placed. He has not been hospitalized for his heart since then with no AICD d/c and no clinical CHF. Last 2D ECHO in 2015 showed EF 45-50%.   Admitted back in May with pyelonephritis, sepsis and with elevated lactate levels. ASA stopped due to hematuria at that time. Most likely triggered by needing to self cath himself - he was slow to recover.   Last seen back in June - noted slow improvement. Had lost weight.   Comes in today. Here with his daughter. I see her as well. He notes he is doing ok. Lonely since his wife died. No chest pain. Breathing is stable. Has been down to the [place] - got labs just a couple of weeks ago - trying to get medicines now from them. He is pretty sedentary. Now on low dose antibiotics. [name] notes that he pretty much does his own medicines and takes what he wants and when he wants. No swelling. Has gained back some of his weight since his illness from May. They both feel like he is holding his own.   Past Medical History:  Diagnosis Date  . Anemia,  unspecified   . CAD (coronary artery disease)   . Cataracts, bilateral   . CHF (congestive heart failure) ([place])   . Chronic airway obstruction, not elsewhere classified    on o2 at night  . Diabetes mellitus   . Diverticulosis of colon (without mention of hemorrhage)   . Dyslipidemia   . Emphysema   . Hemorrhoids   . ICD (implantable cardiac defibrillator) in place    st jude  . Ischemic cardiomyopathy   . Macular degeneration   . MI, old 2010 or 2011  . Prostatic hypertrophy    hx of  . Retention of urine, unspecified   . Unspecified disorder resulting from impaired renal function     Past Surgical History:  Procedure Laterality Date  . CARDIAC DEFIBRILLATOR PLACEMENT  2011   st jude  . CATARACT EXTRACTION    . PACEMAKER INSERTION  2011  . pci     4/11  . TONSILLECTOMY  82 yo     Medications: Current Outpatient Prescriptions  Medication Sig Dispense Refill  . ALPRAZolam (XANAX) 0.25 MG tablet Take 0.25 mg by mouth at bedtime.     Marland Kitchen aspirin EC 81 MG tablet Take 1 tablet (81 mg total) by mouth at bedtime. 30 tablet 0  . clopidogrel (PLAVIX) 75 MG tablet Take 1  tablet (75 mg total) by mouth daily. 30 tablet 8  . furosemide (LASIX) 20 MG tablet Take 1 tablet (20 mg total) by mouth daily. 30 tablet 0  . guaiFENesin (MUCINEX) 600 MG 12 hr tablet Take 600 mg by mouth 2 (two) times daily.    Marland Kitchen ibuprofen (ADVIL,MOTRIN) 200 MG tablet Take 200 mg by mouth every 6 (six) hours as needed for mild pain.    Marland Kitchen losartan (COZAAR) 25 MG tablet Take 25 mg by mouth daily after supper.     . metoprolol tartrate (LOPRESSOR) 25 MG tablet Take 12.5 mg by mouth 2 (two) times daily. Reported on 06/06/2015    . rosuvastatin (CRESTOR) 5 MG tablet Take 5 mg by mouth daily.    . simethicone (MYLICON) 0000000 MG chewable tablet Chew 125 mg by mouth every 6 (six) hours as needed for flatulence.    . trimethoprim (TRIMPEX) 100 MG tablet Take 100 mg by mouth daily.    . budesonide (PULMICORT) 0.5 MG/2ML  nebulizer solution Take 2 mLs (0.5 mg total) by nebulization 2 (two) times daily. 120 mL 3  . nitroGLYCERIN (NITROSTAT) 0.4 MG SL tablet Place 0.4 mg under the tongue every 5 (five) minutes as needed for chest pain. UP TO 3 DOSES THEN CALL EMS    . OXYGEN Inhale into the lungs at bedtime.    . vitamin C (ASCORBIC ACID) 500 MG tablet Take 500 mg by mouth daily.     No current facility-administered medications for this visit.     Allergies: No Known Allergies  Social History: The patient  reports that he quit smoking about 6 years ago. His smoking use included Cigarettes. He has never used smokeless tobacco. He reports that he drinks alcohol. He reports that he does not use drugs.   Family History: The patient's family history includes Atrial fibrillation in his daughter; Breast cancer in his sister; Congestive Heart Failure in his mother; Coronary artery disease in his mother; Heart attack in his brother; Lung cancer in his father; Stroke in his mother.   Review of Systems: Please see the history of present illness.   Otherwise, the review of systems is positive for none.   All other systems are reviewed and negative.   Physical Exam: VS:  BP 118/70   Pulse (!) 59   Wt 173 lb (78.5 kg)   SpO2 95% Comment: at rest  BMI 24.82 kg/m  .  BMI Body mass index is 24.82 kg/m.  Wt Readings from Last 3 Encounters:  10/10/15 173 lb (78.5 kg)  07/06/15 168 lb 1.9 oz (76.3 kg)  06/19/15 165 lb 9.6 oz (75.1 kg)    General: Pleasant. Elderly male - he looks chronically ill - pale. He is alert and in no acute distress.  He is hard of hearing.  HEENT: Normal.  Neck: Supple, no JVD, carotid bruits, or masses noted.  Cardiac: Regular rate and rhythm. No murmurs, rubs, or gallops. No edema.  Respiratory:  Lungs are clear to auscultation bilaterally with normal work of breathing.  GI: Soft and nontender.  MS: No deformity or atrophy. Gait not tested. He is in a wheelchair Skin: Warm and dry. Color  is normal.  Neuro:  Strength and sensation are intact and no gross focal deficits noted.  Psych: Alert, appropriate and with normal affect.   LABORATORY DATA:  EKG:  EKG is not ordered today.  Lab Results  Component Value Date   WBC 7.1 06/19/2015   HGB 9.8 (L) 06/19/2015  HCT 31.2 (L) 06/19/2015   PLT 74 (L) 06/19/2015   GLUCOSE 89 06/19/2015   ALT 30 06/18/2015   AST 14 (L) 06/18/2015   NA 139 06/19/2015   K 4.7 06/19/2015   CL 99 (L) 06/19/2015   CREATININE 1.24 06/19/2015   BUN 36 (H) 06/19/2015   CO2 31 06/19/2015   TSH 1.19 10/07/2011   INR 1.05 12/15/2012   HGBA1C 5.6 07/01/2013    BNP (last 3 results)  Recent Labs  06/06/15 1629 06/11/15 0145  BNP 45.9 114.2*    ProBNP (last 3 results) No results for input(s): PROBNP in the last 8760 hours.   Other Studies Reviewed Today:  LHC in 2011: PCI/DES to RI, dLCx and RCA.   2D ECHO: 11/15/2013 LV EF: 45% -  50% Study Conclusions - Left ventricle: Distal septal and apical hypokinesis. The cavity size was mildly dilated. Systolic function was mildly reduced. The estimated ejection fraction was in the range of 45% to 50%. - Aortic valve: Small systolic gradient without significant stenosis Valve area (VTI): 1.8 cm^2. Valve area (Vmax): 1.72 cm^2. - Mitral valve: Calcified annulus. Mildly thickened leaflets . - Left atrium: The atrium was mildly dilated. - Atrial septum: No defect or patent foramen ovale was identified. - Pulmonary arteries: PA peak pressure: 40 mm Hg (S).    Assessment/Plan:  Ischemic CM with chronic systolic HF - has ICD in place - on stable medical regimen. Recent labs done by the [place]. Continue with supportive care.    CAD s/p DES to RI, dLCX and RCA (2011):  Back on his aspirin along with Plavix. Continue BB and statin.   H/O VF cardiac arrest: s/p St. Jude ICd followed by Dr. Rayann Heman.  HLD: continue statin. Will try to get his Crestor from the [place] - may have to end up  switching.   COPD: stable. followed by Dr Melvyn Novas  CKD:   Prior Sepsis - Major issue that lead to bacteremia is his self cathing with foley Which is occasionally traumatic.  Have recommended suppressant therapy - now on by GU.  Overall, he seems to be holding his own given the nature of all of his issues.   Current medicines are reviewed with the patient today.  The patient does not have concerns regarding medicines other than what has been noted above.  The following changes have been made:  See above.  Labs/ tests ordered today include:   No orders of the defined types were placed in this encounter.    Disposition:   FU with Dr. Johnsie Cancel in 3 to 4 months. Will get his daughter a visit set up as well.   Patient is agreeable to this plan and will call if any problems develop in the interim.   Signed: Burtis Junes, RN, ANP-C 10/10/2015 3:11 PM  [facility] [address] [place], [place]  [address] Phone: [phone] Fax: [phone]

## 2015-10-20 IMAGING — CT CT ABD-PELV W/O CM
2 of 3 series · 16 of 33 positions shown, 18 images · non-contrast
Comparison: Renal ultrasound 07/03/2013.

CLINICAL DATA: Right-sided flank pain. Abnormal ultrasound.
Right-sided hydronephrosis.

EXAM:
CT ABDOMEN AND PELVIS WITHOUT CONTRAST
TECHNIQUE: Multidetector CT imaging of the abdomen and pelvis was performed
following the standard protocol without IV contrast.

[Series 4: lung windows · axial · 0.79mm/px · z∈[-292,-212]mm · 13 of 19 slices shown, 15 images]
[im 2/19  soft-tissue]
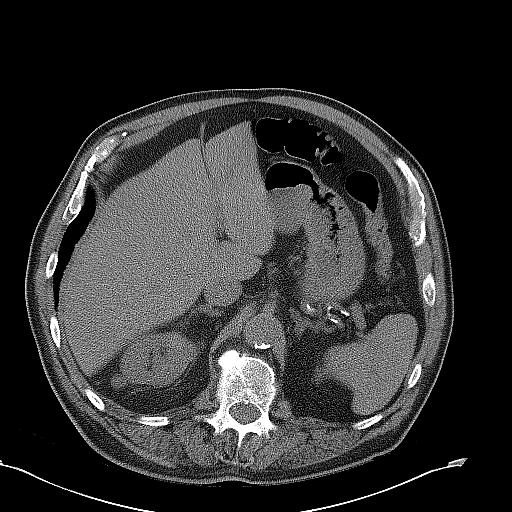
[im 2/19  bone]
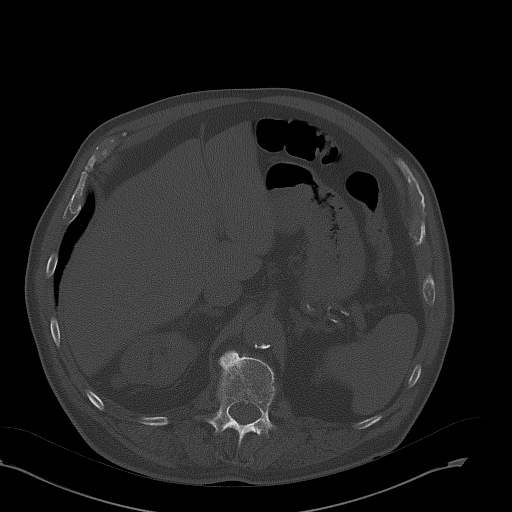
[im 3/19  soft-tissue]
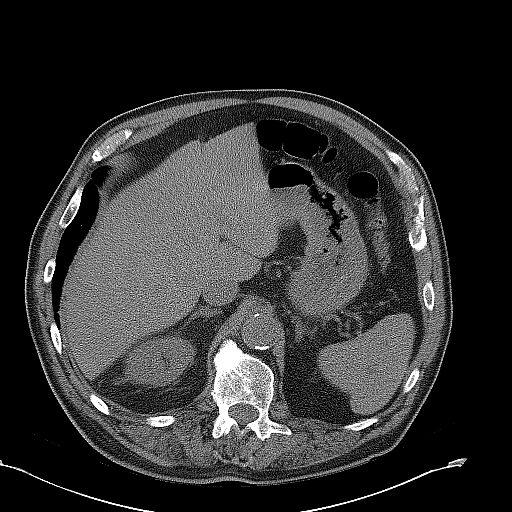
[im 5/19  soft-tissue]
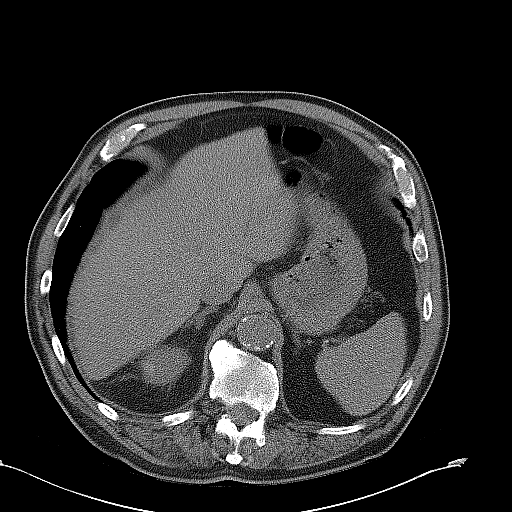
[im 6/19  soft-tissue]
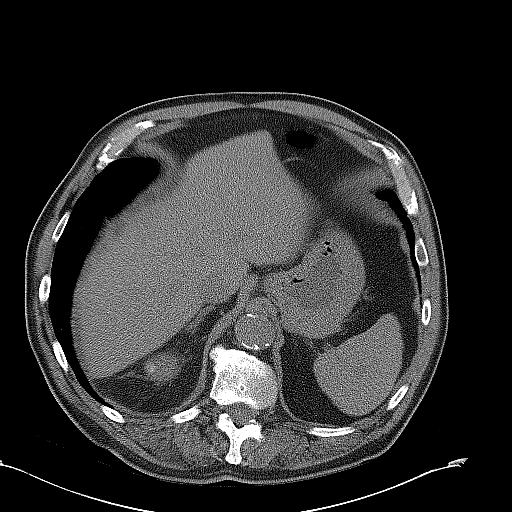
[im 7/19  soft-tissue]
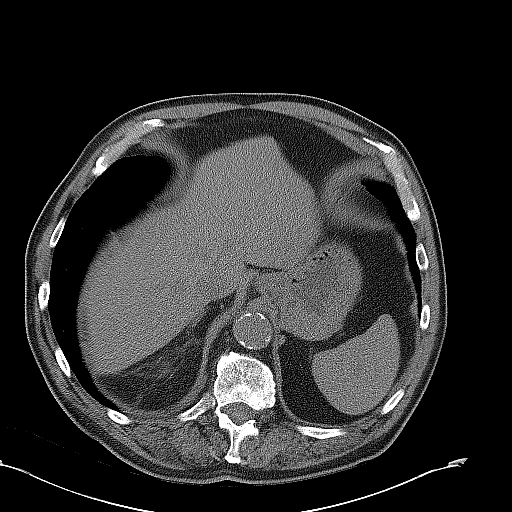
[im 9/19  soft-tissue]
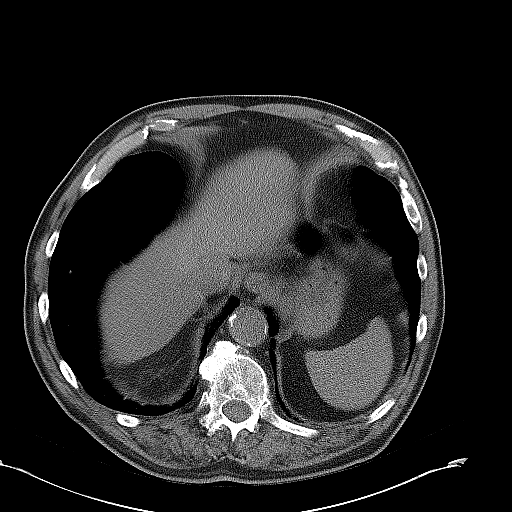
[im 10/19  soft-tissue]
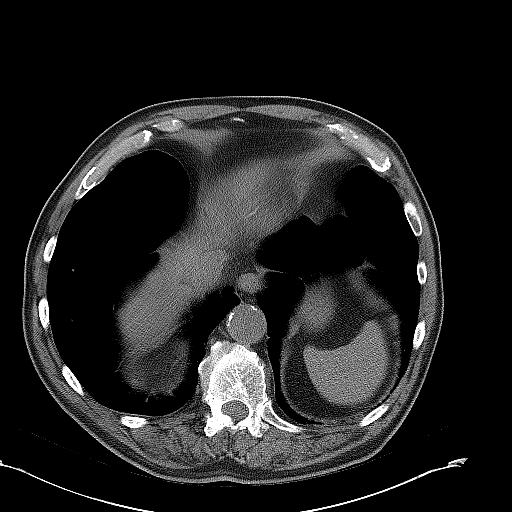
[im 11/19  soft-tissue]
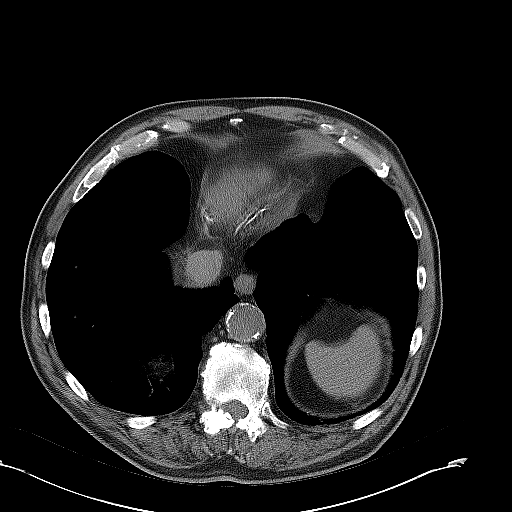
[im 13/19  soft-tissue]
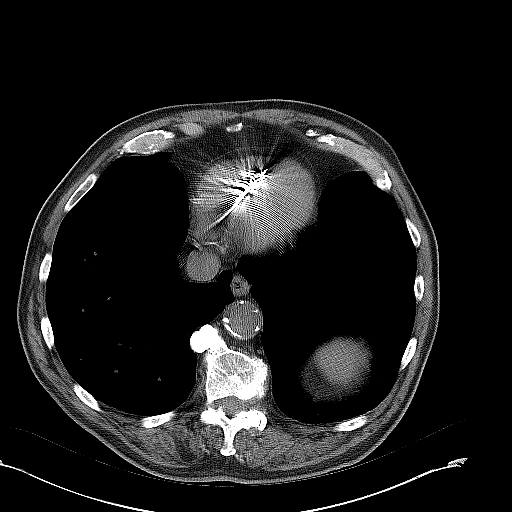
[im 13/19  bone]
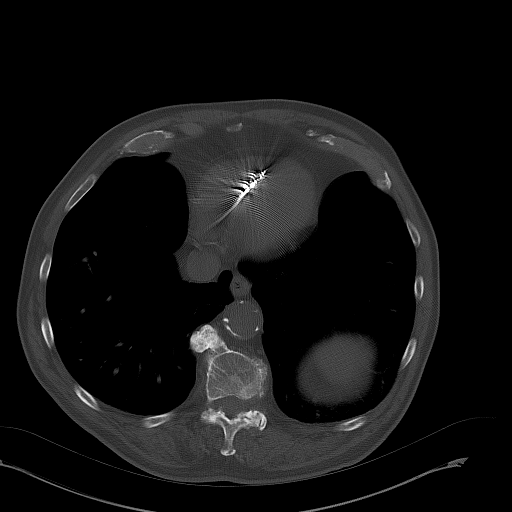
[im 14/19  soft-tissue]
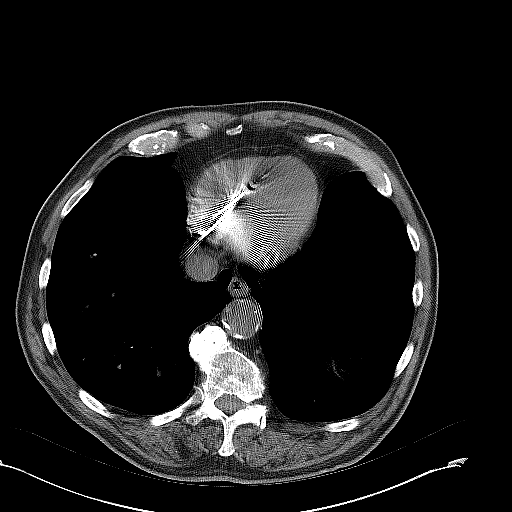
[im 15/19  soft-tissue]
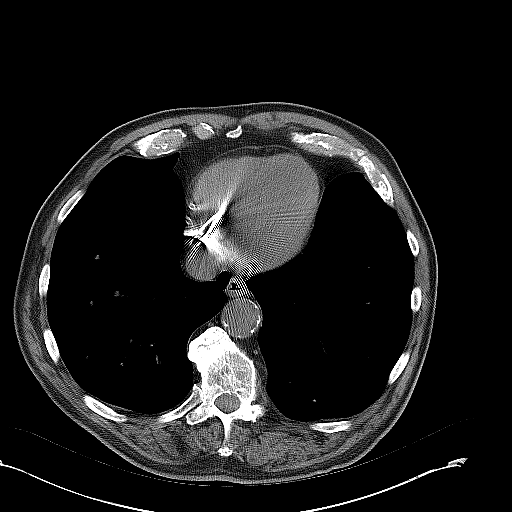
[im 17/19  soft-tissue]
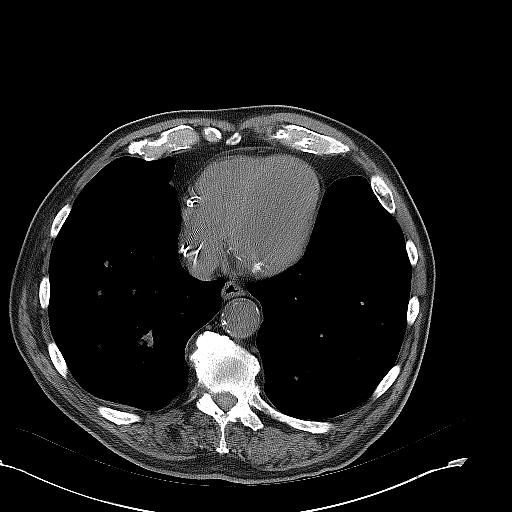
[im 18/19  soft-tissue]
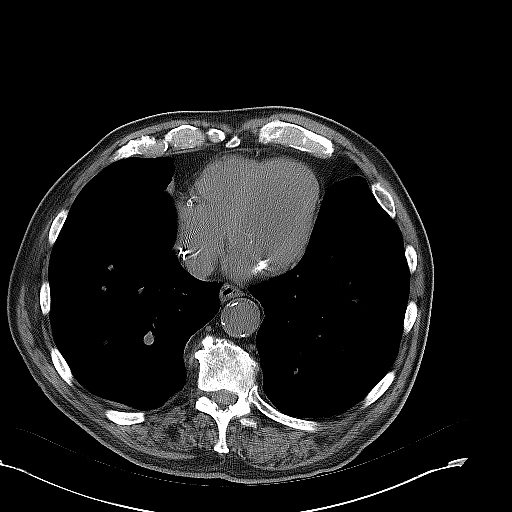

[Series 602: coronal · coronal · 0.82mm/px · 3 of 100 slices shown]
[im 34/100  soft-tissue]
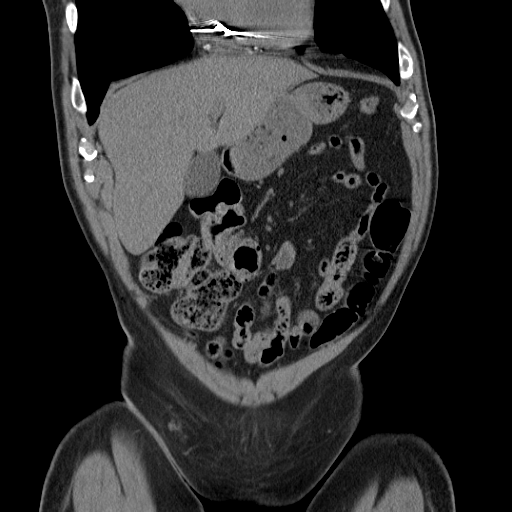
[im 45/100  soft-tissue]
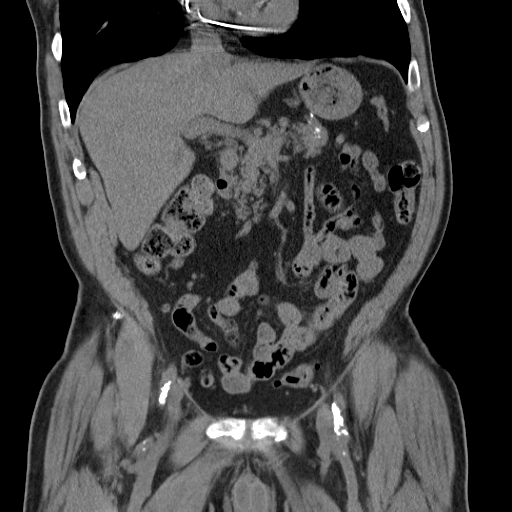
[im 56/100  soft-tissue]
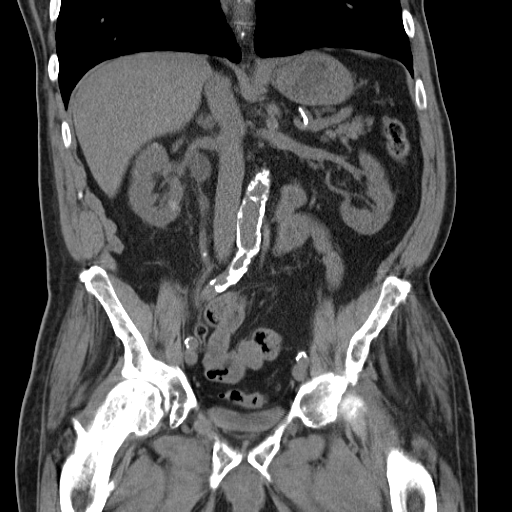

[16 of 33 positions shown; findings below may reference images not displayed]

FINDINGS: The lung bases are clear without focal nodule, mass, or airspace
disease. Pacing wires are in place. Coronary artery calcifications
are noted. The heart size is normal. No significant pleural or
pericardial effusion is present.

The liver and spleen are within normal limits. Stomach, duodenum,
and pancreas are unremarkable. Layering densities are present at the
neck the gallbladder compatible with small stones. There is no
evidence for inflammation to suggest cholecystitis. The adrenal
glands are normal bilaterally. Multiple hyperdense exophytic cystic
lesions are present in the right kidney. The largest hyperdense
lesion measures 3.5 cm. A nonobstructing stone in the midportion of
the right kidney measures 4 mm. Isodense lesions are present at the
lower pole of the right kidney. A 3.5 cm exophytic cyst is present
at the lower pole of the left kidney an adjacent lesion appears
slightly more heterogeneous.

Mild to moderate right-sided hydronephrosis is present. An
obstructing 6 mm ureteral stone is present at the level of L5. There
is an additional punctate stone just above this area. The ureters
dilated in inflamed above this level. It is more normal caliber
below the stone. The left renal collecting system and ureter is
unremarkable.

Extensive diverticular changes are present throughout the sigmoid
colon. No focal inflammation is evident to suggest diverticulitis.
Diverticular present within the descending colon as well. The more
proximal colon is within normal limits. The appendix is visualized
and normal.

Extensive atherosclerotic calcifications are present in the aorta
and branch vessels without aneurysm.

Levoconvex scoliosis of the lumbar spine is centered at L1-2.
Moderate facet degenerative changes are present. Foraminal narrowing
is present at L3-4, L4-5, and L5-S1. No focal lytic or blastic
lesions are present.
IMPRESSION: 1. Right-sided hydronephrosis with a 6 mm obstructing mid right
ureteral stone at the level of L5.
2. An additional punctate nonobstructing stone is present in the
ureter just above the obstructing stone.
3. Nonobstructing 4 mm stone in the midportion of the right kidney.
4. Multiple bilateral exophytic lesions of both kidneys. While many
of these are cysts, they are incompletely characterized by
noncontrast CT and ultrasound. MRI of the kidneys without and with
contrast is recommended for further evaluation. Non-emergent MRI
should be deferred until patient has been discharged for the acute
illness, and can optimally cooperate with positioning and
breath-holding instructions.
5. Sigmoid diverticulosis without diverticulitis.
6. Cholelithiasis without cholecystitis.
7. Extensive atherosclerotic change without evidence for aneurysm.
8. Moderate spondylosis and scoliosis of the lumbar spine.

## 2015-10-20 IMAGING — US US RENAL
1 series · 13 of 25 positions shown · non-contrast
Comparison: Abdominal ultrasound 05/13/2010.

CLINICAL DATA: Right-sided flank.  Acute renal failure.

EXAM:
RENAL/URINARY TRACT ULTRASOUND COMPLETE

[Series 1: us renal · 0.25mm/px · 13 of 61 slices shown]
[im 1/61]
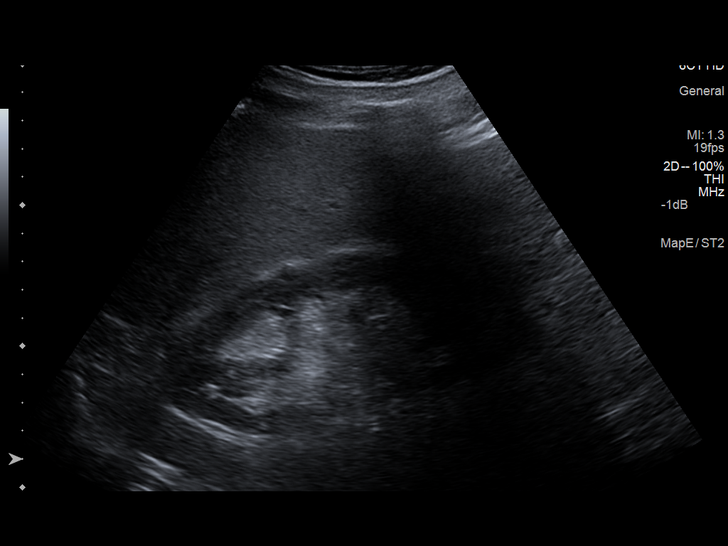
[im 6/61]
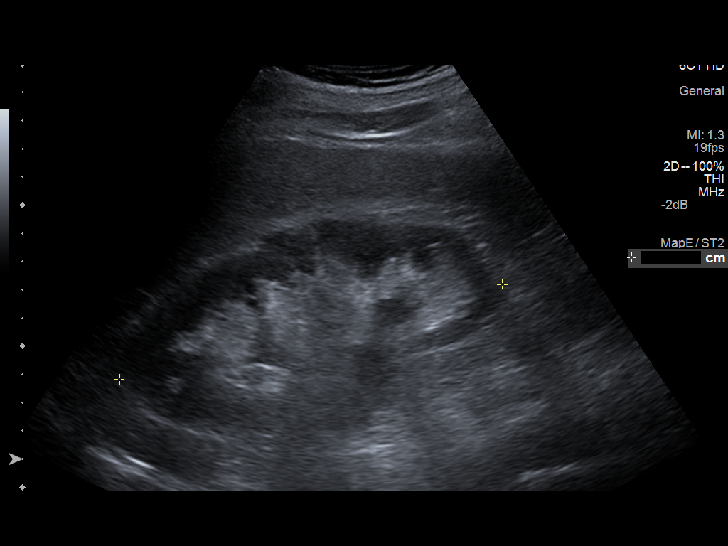
[im 11/61]
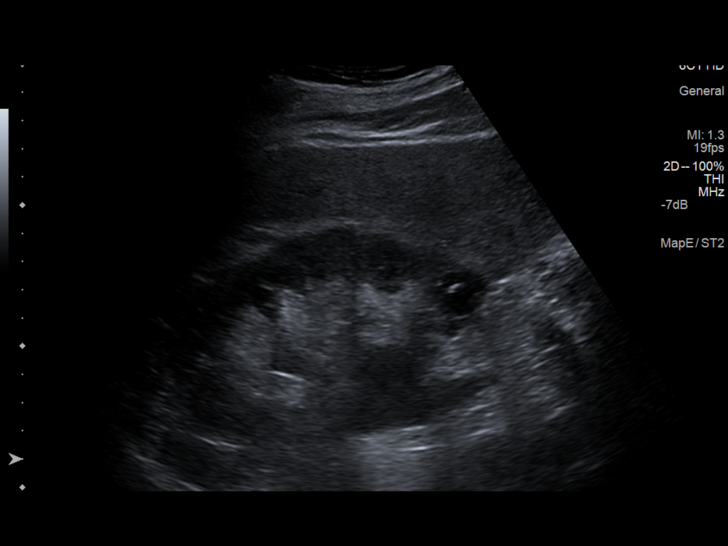
[im 16/61]
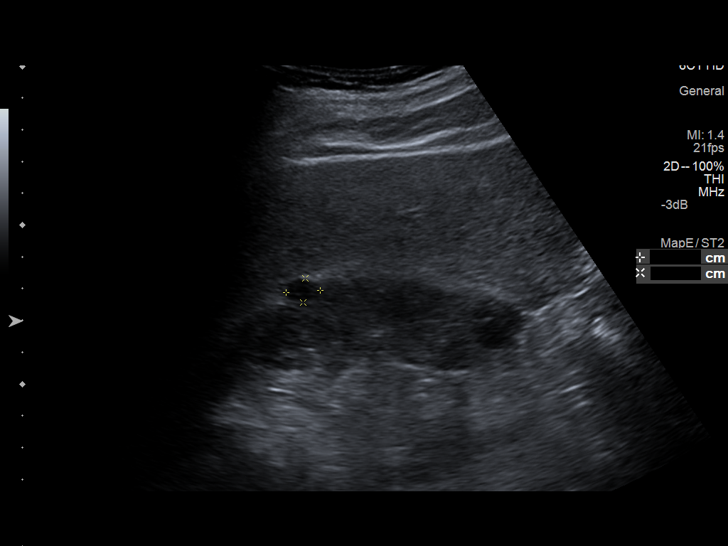
[im 21/61]
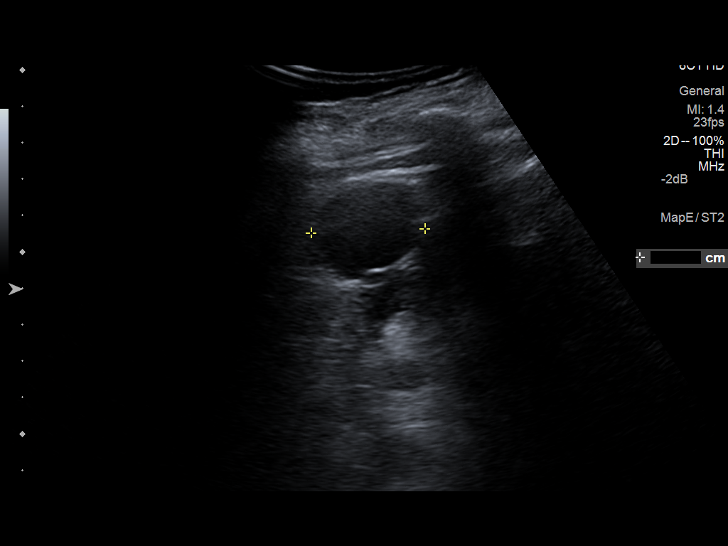
[im 26/61]
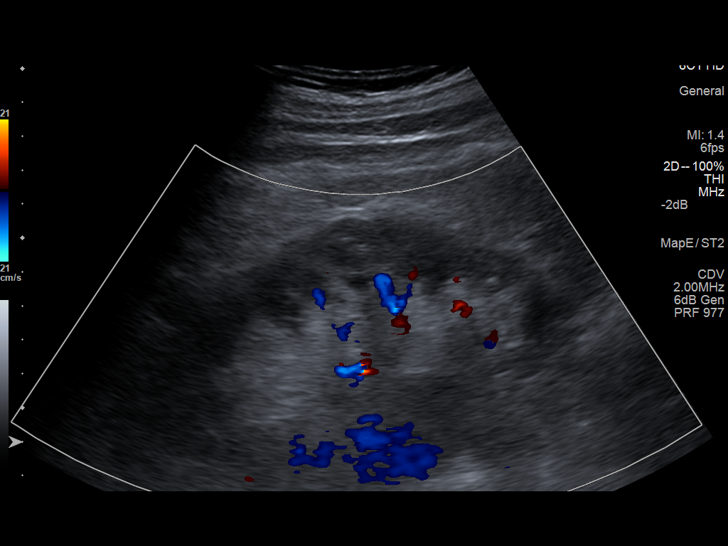
[im 31/61]
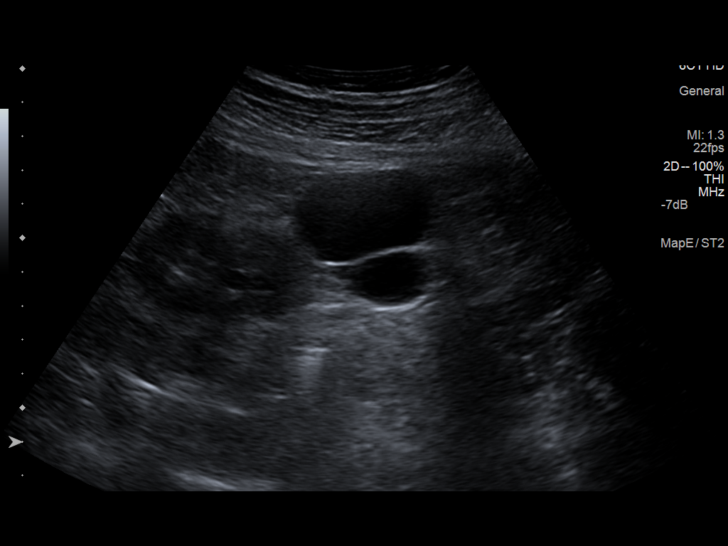
[im 36/61]
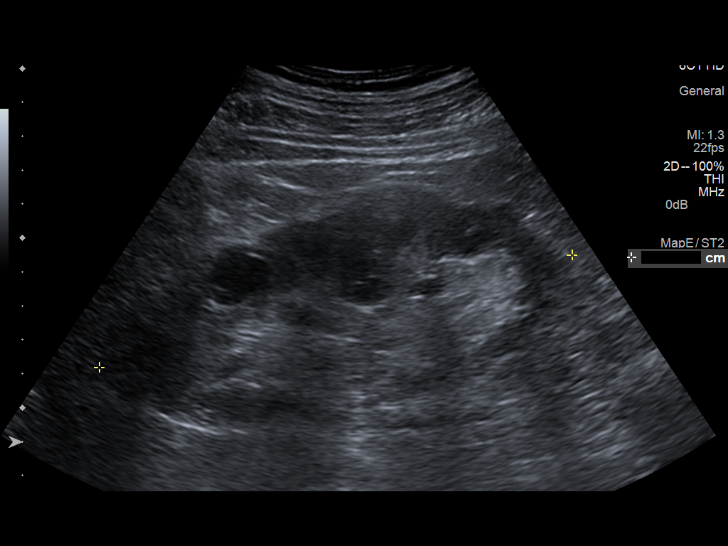
[im 41/61]
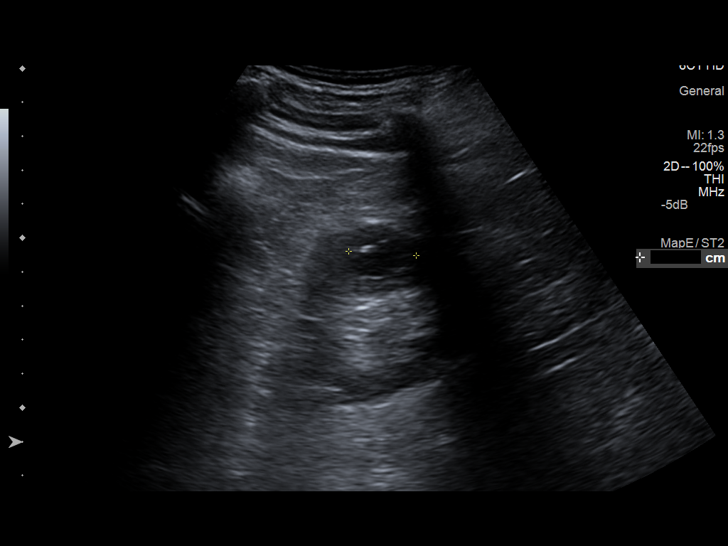
[im 46/61]
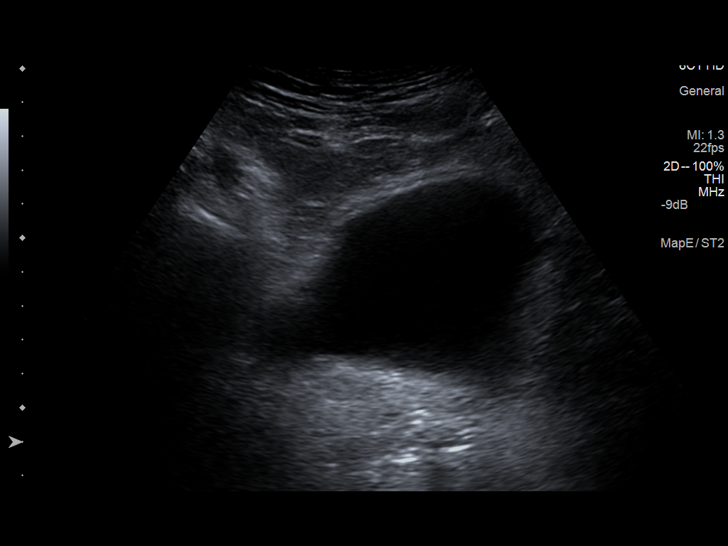
[im 51/61]
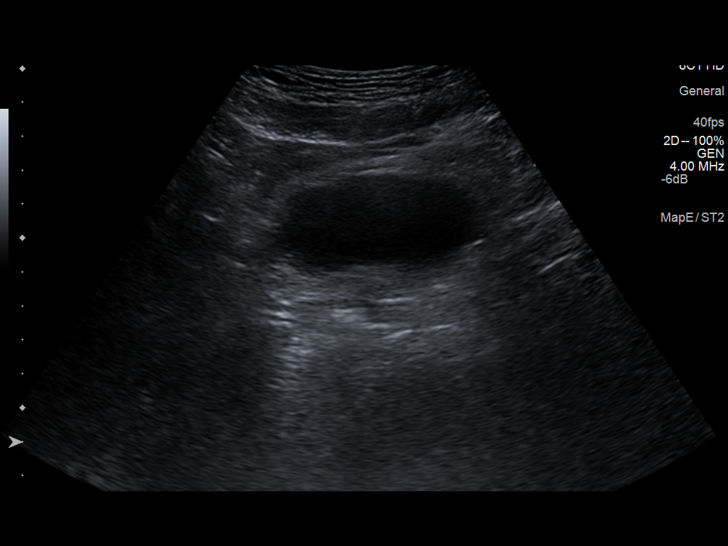
[im 56/61]
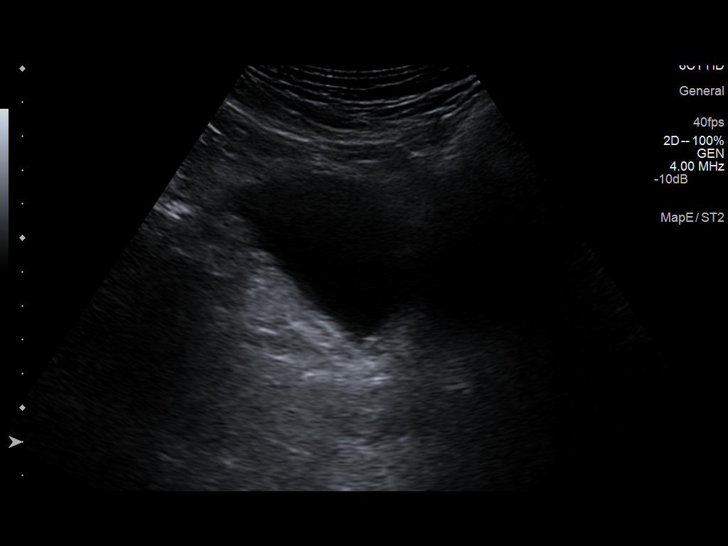
[im 61/61]
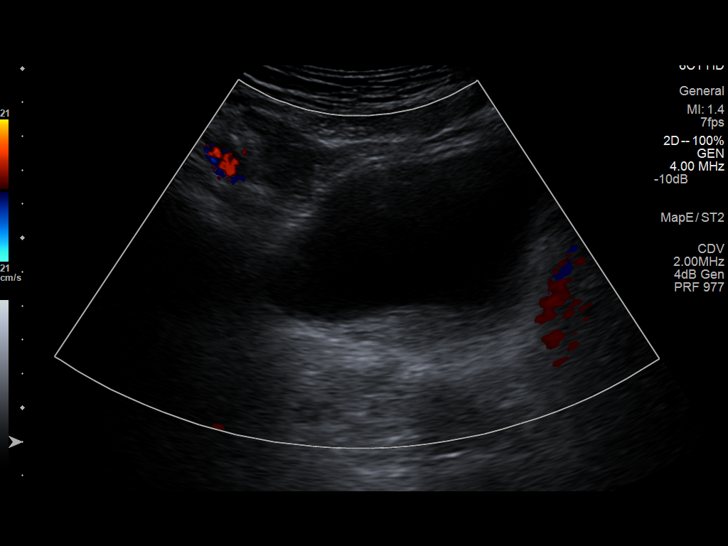

[13 of 25 positions shown; findings below may reference images not displayed]

FINDINGS: Right Kidney:

Length: 14.0 cm. Mild right-sided hydronephrosis. Echogenicity
within normal limits. Multiple right-sided renal lesions, the
majority of which are too small to characterize. The lesions larger
than a centimeter are anechoic with increased through transmission,
compatible with simple cysts, largest of which measures up to 2.9 x
3.2 x 3.1 cm. The exception to this is one lesion in the lower pole
that has a thick internal septation, which measures 1.9 x 1.7 x
cm.

Left Kidney:

Length: 14.6 cm. Echogenicity within normal limits. No
hydronephrosis visualized. Multiple anechoic lesions with increased
through transmission, compatible simple cysts, largest of which
measures 4.2 x 4.8 x 3.9 cm.

Bladder:

Appears normal for degree of bladder distention. A left ureteral jet
is noted. The right ureteral jet is not visualized.
IMPRESSION: 1. Mild right-sided hydronephrosis. The right ureteral jet is not
visualized. If there is clinical concern for obstructive uropathy,
further evaluation with dedicated noncontrast CT of the abdomen and
pelvis is recommended.
2. Multiple renal lesions bilaterally. The majority of these appear
to represent simple cysts, however, there is a complex cyst with
thick internal septation or mural nodule in the lower pole of the
right kidney (Bosniak class 3). Further evaluation with nonemergent
MRI of the abdomen with and without IV gadolinium is suggested to
exclude malignancy.

## 2015-10-30 ENCOUNTER — Ambulatory Visit (INDEPENDENT_AMBULATORY_CARE_PROVIDER_SITE_OTHER): Payer: Medicare Other

## 2015-10-30 DIAGNOSIS — I5022 Chronic systolic (congestive) heart failure: Secondary | ICD-10-CM

## 2015-10-30 DIAGNOSIS — Z95810 Presence of automatic (implantable) cardiac defibrillator: Secondary | ICD-10-CM

## 2015-10-30 NOTE — Progress Notes (Signed)
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  Transmission reviewed.  Thoracic impedance abnormal for last 3 days  Follow-up plan: ICM clinic phone appointment on 11/14/2015 to recheck fluid levels.  Copy of ICM check sent to device physician.   ICM trend: 10/30/2015       Rosalene Billings, RN 10/30/2015 11:52 AM

## 2015-10-31 ENCOUNTER — Telehealth: Payer: Self-pay

## 2015-10-31 NOTE — Telephone Encounter (Signed)
Remote ICM transmission received.  Attempted patient call and left message to return call.   

## 2015-11-01 NOTE — Progress Notes (Signed)
Daughter returned call.  She reported patient is doing well  Heart Failure questions reviewed, pt asymptomatic   Thoracic impedance slightly below baseline.   Recommendations: No changes.  Low sodium diet education provided  Next transmission 11/14/2015.

## 2015-11-14 ENCOUNTER — Telehealth: Payer: Self-pay

## 2015-11-14 ENCOUNTER — Other Ambulatory Visit: Payer: Self-pay | Admitting: Internal Medicine

## 2015-11-14 ENCOUNTER — Ambulatory Visit (INDEPENDENT_AMBULATORY_CARE_PROVIDER_SITE_OTHER): Payer: Medicare Other

## 2015-11-14 DIAGNOSIS — Z95810 Presence of automatic (implantable) cardiac defibrillator: Secondary | ICD-10-CM

## 2015-11-14 DIAGNOSIS — I5022 Chronic systolic (congestive) heart failure: Secondary | ICD-10-CM

## 2015-11-14 NOTE — Progress Notes (Signed)
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 Left detailed message regarding transmission.  Transmission reviewed.   Thoracic impedance normal     Follow-up plan: ICM clinic phone appointment on 12/25/2015.  Copy of ICM check sent to device physician.   ICM trend: 11/14/2015        Rosalene Billings, RN 11/14/2015 3:19 PM

## 2015-11-14 NOTE — Telephone Encounter (Signed)
Remote ICM transmission received.  Attempted call to daughter and left detailed message regarding transmission and next ICM scheduled for 12/25/2015.  Advised to return call for any fluid symptoms or questions.

## 2015-12-14 ENCOUNTER — Telehealth: Payer: Self-pay

## 2015-12-14 ENCOUNTER — Ambulatory Visit (INDEPENDENT_AMBULATORY_CARE_PROVIDER_SITE_OTHER): Payer: Medicare Other

## 2015-12-14 DIAGNOSIS — I5022 Chronic systolic (congestive) heart failure: Secondary | ICD-10-CM

## 2015-12-14 DIAGNOSIS — J441 Chronic obstructive pulmonary disease with (acute) exacerbation: Secondary | ICD-10-CM | POA: Diagnosis not present

## 2015-12-14 DIAGNOSIS — I950 Idiopathic hypotension: Secondary | ICD-10-CM | POA: Diagnosis not present

## 2015-12-14 DIAGNOSIS — Z95810 Presence of automatic (implantable) cardiac defibrillator: Secondary | ICD-10-CM

## 2015-12-14 NOTE — Telephone Encounter (Signed)
Spoke w/ pt about sending a remote transmission. He informed me that he knew how.

## 2015-12-14 NOTE — Progress Notes (Signed)
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    Thoracic impedance normal.  Reviewed transmission with daughter and she stated patient has a PCP office visit today due to the chest congestion for the last 3 days.   Recommendations:  Advised daughter that even though transmission is not showing fluid accumulation he still may have some and the PCP today can provide any recommendations if needed for fluid accumulation.     Follow-up plan: ICM clinic phone appointment on 12/25/2015.  Copy of ICM check sent to device physician.   ICM trend: 12/14/2015       Rosalene Billings, RN 12/14/2015 11:43 AM

## 2015-12-14 NOTE — Telephone Encounter (Signed)
Received call from daughter.  She asked to have a transmission reviewed due to patient is complaining of congestion.  He does not know how to send manually.  Advised will assist him with sending it.

## 2015-12-14 NOTE — Telephone Encounter (Signed)
Received transmission.  See ICM note

## 2015-12-18 DIAGNOSIS — E782 Mixed hyperlipidemia: Secondary | ICD-10-CM | POA: Diagnosis not present

## 2015-12-18 DIAGNOSIS — J441 Chronic obstructive pulmonary disease with (acute) exacerbation: Secondary | ICD-10-CM | POA: Diagnosis not present

## 2015-12-18 DIAGNOSIS — E119 Type 2 diabetes mellitus without complications: Secondary | ICD-10-CM | POA: Diagnosis not present

## 2015-12-18 DIAGNOSIS — I10 Essential (primary) hypertension: Secondary | ICD-10-CM | POA: Diagnosis not present

## 2015-12-25 ENCOUNTER — Telehealth: Payer: Self-pay | Admitting: Cardiovascular Disease

## 2015-12-25 ENCOUNTER — Ambulatory Visit (INDEPENDENT_AMBULATORY_CARE_PROVIDER_SITE_OTHER): Payer: Medicare Other | Admitting: *Deleted

## 2015-12-25 DIAGNOSIS — Z95810 Presence of automatic (implantable) cardiac defibrillator: Secondary | ICD-10-CM | POA: Diagnosis not present

## 2015-12-25 DIAGNOSIS — I255 Ischemic cardiomyopathy: Secondary | ICD-10-CM

## 2015-12-25 DIAGNOSIS — I5022 Chronic systolic (congestive) heart failure: Secondary | ICD-10-CM | POA: Diagnosis not present

## 2015-12-25 LAB — CUP PACEART REMOTE DEVICE CHECK
Brady Statistic RA Percent Paced: 1 % — CL
Date Time Interrogation Session: 20180108110314
HighPow Impedance: 71 Ohm
Implantable Lead Implant Date: 20111117
Implantable Lead Location: 753859
Implantable Pulse Generator Implant Date: 20111117
Lead Channel Impedance Value: 340 Ohm
Lead Channel Impedance Value: 380 Ohm
Lead Channel Sensing Intrinsic Amplitude: 2.2 mV
Lead Channel Setting Pacing Amplitude: 2 V
MDC IDC LEAD IMPLANT DT: 20111117
MDC IDC LEAD LOCATION: 753860
MDC IDC LEAD MODEL: 7122
MDC IDC MSMT LEADCHNL RV SENSING INTR AMPL: 11.9 mV
MDC IDC PG SERIAL: 623396
MDC IDC SET LEADCHNL RV PACING AMPLITUDE: 2.5 V
MDC IDC SET LEADCHNL RV PACING PULSEWIDTH: 0.5 ms
MDC IDC SET LEADCHNL RV SENSING SENSITIVITY: 0.5 mV
MDC IDC STAT BRADY RV PERCENT PACED: 1 % — AB

## 2015-12-25 NOTE — Telephone Encounter (Signed)
[place] Handicapped paper ready for pick up patient is aware.

## 2015-12-25 NOTE — Progress Notes (Signed)
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 Left detailed message regarding transmission.  Transmission reviewed.   Thoracic impedance normal since 12/20/2015.  Was abnormal suggesting fluid accumulation from 11/19 to 11/23 and patient was complaining of chest congestion on 12/14/2015.  Follow-up plan: ICM clinic phone appointment on 01/25/2016.  Copy of ICM check sent to device physician.   ICM trend: 12/25/2015       [name] Billings, RN 12/25/2015 11:08 AM

## 2015-12-26 NOTE — Progress Notes (Signed)
Remote ICD transmission.   

## 2015-12-27 ENCOUNTER — Encounter: Payer: Self-pay | Admitting: Cardiology

## 2016-01-09 NOTE — Progress Notes (Signed)
Patient ID: [name] Azure, MD, male   DOB: Dec 03, 1925, 82 y.o.   MRN: [id number]    Cardiology Office Note    Date:  01/10/2016   ID:  [name] Azure, MD, DOB 1925/08/29, MRN [id number]  PCP:  [name], MD  Cardiologist: Dr. Johnsie Cancel Dr. 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   In 2011, he survived a prolonged out of hospital cardiac arrest with ventricular fibrillation. He was found to have CAD with stents in RCA/circumflex and RI. EF 31% with large area of scar by MRI. St Jude AICD subsequently placed. He has not been hospitalized for his heart since then with no AICD d/c and no clinical CHF. Last 2D ECHO in 2015 showed EF 45-50%.   Seen 06/06/15 in office with concern for pneumonia  Cough and dyspnea. Always has mechanical breathing issues CXR with mild CE no penumonia 06/14/15 Barrium Swallow Presbyesophagus  Latter admitted with pyelonephritis, sepsis with elevated lactate levels. ASA stopped due to hematuria Since d/c slow improvement.   Coreview valvues normal in November   Lab Results  Component Value Date   CREATININE 1.24 06/19/2015   BUN 36 (H) 06/19/2015   NA 139 06/19/2015   K 4.7 06/19/2015   CL 99 (L) 06/19/2015   CO2 31 06/19/2015    BNP    Component Value Date/Time   BNP 114.2 (H) 06/11/2015 0145   BNP 45.9 06/06/2015 1629    ProBNP    Component Value Date/Time   PROBNP 759.3 (H) 11/11/2013 2139      Past Medical History:  Diagnosis Date  . Anemia, unspecified   . CAD (coronary artery disease)   . Cataracts, bilateral   . CHF (congestive heart failure) ([place])   . Chronic airway obstruction, not elsewhere classified    on o2 at night  . Diabetes  mellitus   . Diverticulosis of colon (without mention of hemorrhage)   . Dyslipidemia   . Emphysema   . Hemorrhoids   . ICD (implantable cardiac defibrillator) in place    st jude  . Ischemic cardiomyopathy   . Macular degeneration   . MI, old 2010 or 2011  . Prostatic hypertrophy    hx of  . Retention of urine, unspecified   . Unspecified disorder resulting from impaired renal function     Past Surgical History:  Procedure Laterality Date  . CARDIAC DEFIBRILLATOR PLACEMENT  2011   st jude  . CATARACT EXTRACTION    . PACEMAKER INSERTION  2011  . pci     4/11  . TONSILLECTOMY  82 yo    Current Medications: Outpatient Medications Prior to Visit  Medication Sig Dispense Refill  . ALPRAZolam (XANAX) 0.25 MG tablet Take 0.25 mg by mouth at bedtime.     Marland Kitchen aspirin EC 81 MG tablet Take 1 tablet (81 mg total) by mouth at bedtime. 30 tablet 0  . budesonide (PULMICORT) 0.5 MG/2ML nebulizer solution Take 2 mLs (0.5 mg total) by nebulization 2 (two) times daily. 120 mL 3  . clopidogrel (PLAVIX) 75 MG tablet Take 1 tablet (75 mg total) by mouth daily. 30 tablet 8  . guaiFENesin (MUCINEX) 600 MG 12 hr  tablet Take 600 mg by mouth 2 (two) times daily.    Marland Kitchen ibuprofen (ADVIL,MOTRIN) 200 MG tablet Take 200 mg by mouth every 6 (six) hours as needed for mild pain.    Marland Kitchen losartan (COZAAR) 25 MG tablet TAKE 1 TABLET ONCE DAILY. 30 tablet 3  . nitroGLYCERIN (NITROSTAT) 0.4 MG SL tablet Place 0.4 mg under the tongue every 5 (five) minutes as needed for chest pain. UP TO 3 DOSES THEN CALL EMS    . OXYGEN Inhale into the lungs at bedtime.    . rosuvastatin (CRESTOR) 5 MG tablet TAKE (1) TABLET DAILY IN THE MORNING. 30 tablet 3  . simethicone (MYLICON) 0000000 MG chewable tablet Chew 125 mg by mouth every 6 (six) hours as needed for flatulence.    . trimethoprim (TRIMPEX) 100 MG tablet Take 100 mg by mouth daily.    . vitamin C (ASCORBIC ACID) 500 MG tablet Take 500 mg by mouth daily.    . furosemide (LASIX)  20 MG tablet Take 1 tablet (20 mg total) by mouth daily. (Patient not taking: Reported on 01/10/2016) 30 tablet 0  . metoprolol tartrate (LOPRESSOR) 25 MG tablet Take 12.5 mg by mouth 2 (two) times daily. Reported on 06/06/2015     No facility-administered medications prior to visit.      Allergies:   Patient has no known allergies.   Social History   Social History  . Marital status: Married    Spouse name: N/A  . Number of children: N/A  . 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 Smoking status: Former Smoker    Types: Cigarettes    Quit date: 04/27/2009  . Smokeless tobacco: Never Used  . Alcohol use Yes     Comment: occasional beer  . Drug use: No  . Sexual activity: No   Other Topics Concern  . None   Social History Narrative   Lives in a house with stairs, which he needs to use, with his wife.  Wife is not able-bodied.  Does not use a cane or walker, but is unsteady on his feet.        [name] Edds:  [phone] cell, [phone], daughter, POA.     Boyd [id number]  [phone], nephew   Juluis [id number]  [phone], daughter              Family History:  The patient's family history includes Atrial fibrillation in his daughter; Breast cancer in his sister; Congestive Heart Failure in his mother; Coronary artery disease in his mother; Heart attack in his brother; Lung cancer in his father; Stroke in his mother.   ROS:   Please see the history of present illness.    ROS All other systems reviewed and are negative.   PHYSICAL EXAM:   VS:  BP (!) 160/74 (BP Location: Right Arm, Patient Position: Sitting, Cuff Size: Normal)   Pulse 84   Ht 5\' 10"  (1.778 m)   Wt 170 lb 12.8 oz (77.5 kg)   BMI 24.51 kg/m    Elderly retired Pharmacist, community  HEENT: normal Neck: no JVD, carotid bruits, or masses Cardiac: RRR; no murmurs, rubs, or gallops,no edema  AICD under left clavicle  Respiratory:  Diffuse  rhonchi, no wheezing.  normal work of breathing GI: soft, nontender, nondistended, + BS MS: no deformity or atrophy Skin: warm and dry, no rash Neuro:  Alert and Oriented x 3, Strength and sensation  are intact Psych: euthymic mood, full affect  Wt Readings from Last 3 Encounters:  01/10/16 170 lb 12.8 oz (77.5 kg)  10/10/15 173 lb (78.5 kg)  07/06/15 168 lb 1.9 oz (76.3 kg)      Studies/Labs Reviewed:   EKG:  EKG is ordered today.  NSR with PAC HR 77  Recent Labs: 06/11/2015: B Natriuretic Peptide 114.2 06/18/2015: ALT 30; Magnesium 2.0 06/19/2015: BUN 36; Creatinine, Ser 1.24; Hemoglobin 9.8; Platelets 74; Potassium 4.7; Sodium 139   Lipid Panel No results found for: CHOL, TRIG, HDL, CHOLHDL, VLDL, LDLCALC, LDLDIRECT  Additional studies/ records that were reviewed today include:  LHC in 2011: PCI/DES to RI, dLCx and RCA.   2D ECHO: 11/15/2013 LV EF: 45% -  50% Study Conclusions - Left ventricle: Distal septal and apical hypokinesis. The cavity size was mildly dilated. Systolic function was mildly reduced. The estimated ejection fraction was in the range of 45% to 50%. - Aortic valve: Small systolic gradient without significant stenosis Valve area (VTI): 1.8 cm^2. Valve area (Vmax): 1.72 cm^2. - Mitral valve: Calcified annulus. Mildly thickened leaflets . - Left atrium: The atrium was mildly dilated. - Atrial septum: No defect or patent foramen ovale was identified. - Pulmonary arteries: PA peak pressure: 40 mm Hg (S).  ASSESSMENT:    No diagnosis found.   PLAN:  In order of problems listed above:  Dyspnea:  Improved CHF euvolemic continue current dose diuretic His impedence drops don't Always correlate with elevated BNP. Told him to take lasix when he is dyspnic and transmit Reading from CRT/AICD.  ICM monitoring normal since 12/14/15   CAD s/p DES to RI, dLCX and RCA (2011):  Asa held . Continue BB and statin.   H/O VF cardiac arrest: s/p St. Jude ICd  followed by Dr. Rayann Heman.  HLD: continue statin.   COPD: stable. followed by Dr Melvyn Novas  CKD: at baseline will not over diurese  Major issue that lead to bacteremia is his self cathing with foley Which is occasionally traumatic. Given CAD important to restart ASA ASAP but cannot While post nephritis hematuria continues  Above complex issues discussed with daughter [name] who will be with Dr [name] when he sees urology   Jenkins Rouge

## 2016-01-10 ENCOUNTER — Ambulatory Visit (INDEPENDENT_AMBULATORY_CARE_PROVIDER_SITE_OTHER): Payer: Medicare Other | Admitting: Cardiovascular Disease

## 2016-01-10 ENCOUNTER — Encounter: Payer: Self-pay | Admitting: Cardiovascular Disease

## 2016-01-10 VITALS — BP 160/74 | HR 84 | Ht 70.0 in | Wt 170.8 lb

## 2016-01-10 DIAGNOSIS — I2510 Atherosclerotic heart disease of native coronary artery without angina pectoris: Secondary | ICD-10-CM | POA: Diagnosis not present

## 2016-01-10 DIAGNOSIS — I255 Ischemic cardiomyopathy: Secondary | ICD-10-CM | POA: Diagnosis not present

## 2016-01-10 NOTE — Patient Instructions (Signed)

## 2016-01-25 ENCOUNTER — Ambulatory Visit (INDEPENDENT_AMBULATORY_CARE_PROVIDER_SITE_OTHER): Payer: Medicare Other

## 2016-01-25 DIAGNOSIS — I5022 Chronic systolic (congestive) heart failure: Secondary | ICD-10-CM | POA: Diagnosis not present

## 2016-01-25 DIAGNOSIS — Z95810 Presence of automatic (implantable) cardiac defibrillator: Secondary | ICD-10-CM

## 2016-01-25 NOTE — Progress Notes (Signed)
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  Reinforced to limit low salt food choices to 2000 mg day and limiting fluid intake to < 2 liters per day. Encouraged to call for fluid symptoms.    Follow-up plan: ICM clinic phone appointment on 02/25/2016.  Copy of ICM check sent to device physician.   3 month ICM trend : 01/25/2016   1 Year ICM trend:      Rosalene Billings, RN 01/25/2016 9:24 AM

## 2016-02-25 ENCOUNTER — Telehealth: Payer: Self-pay

## 2016-02-25 ENCOUNTER — Ambulatory Visit (INDEPENDENT_AMBULATORY_CARE_PROVIDER_SITE_OTHER): Payer: Medicare Other

## 2016-02-25 DIAGNOSIS — Z95810 Presence of automatic (implantable) cardiac defibrillator: Secondary | ICD-10-CM | POA: Diagnosis not present

## 2016-02-25 DIAGNOSIS — I5022 Chronic systolic (congestive) heart failure: Secondary | ICD-10-CM | POA: Diagnosis not present

## 2016-02-25 NOTE — Telephone Encounter (Signed)
Remote ICM transmission received.  Attempted patient call and left detailed message regarding transmission and next ICM scheduled for 03/27/2016.  Advised to return call for any fluid symptoms or questions.

## 2016-02-25 NOTE — Progress Notes (Signed)
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  Transmission reviewed.   Thoracic impedance normal   Recommendations:  Provided ICM number and encouraged to call for fluid symptoms.  Follow-up plan: ICM clinic phone appointment on 03/27/2016.  Copy of ICM check sent to device physician.   3 month ICM trend: 02/25/2016  1 Year ICM trend:      Rosalene Billings, RN 02/25/2016 3:35 PM

## 2016-03-21 ENCOUNTER — Telehealth: Payer: Self-pay | Admitting: Cardiovascular Disease

## 2016-03-21 ENCOUNTER — Other Ambulatory Visit: Payer: Self-pay | Admitting: *Deleted

## 2016-03-21 MED ORDER — ROSUVASTATIN CALCIUM 5 MG PO TABS: 5.0000 mg | ORAL_TABLET | Freq: Every day | ORAL | 3 refills | Status: DC

## 2016-03-21 NOTE — Telephone Encounter (Signed)
°  Follow Up   States refill requests (3) were sent over to our office from [facility] but states they have no received a response. Pts wife states pt does not remember what the medication is called. Please call.

## 2016-03-21 NOTE — Telephone Encounter (Signed)
Patient does not have a lipid panel in epic. Okay to refill? Please advise. Thanks, MI 

## 2016-03-21 NOTE — Telephone Encounter (Signed)
Called pharmacy and they stated that they have sent three refill requests for rosuvastatin. We did not receive the requests. Message sent to nurse to see if okay to refill as patient does not have a lipid panel in epic.

## 2016-03-27 ENCOUNTER — Ambulatory Visit (INDEPENDENT_AMBULATORY_CARE_PROVIDER_SITE_OTHER): Payer: Medicare Other

## 2016-03-27 DIAGNOSIS — Z95810 Presence of automatic (implantable) cardiac defibrillator: Secondary | ICD-10-CM | POA: Diagnosis not present

## 2016-03-27 DIAGNOSIS — I5022 Chronic systolic (congestive) heart failure: Secondary | ICD-10-CM | POA: Diagnosis not present

## 2016-03-28 ENCOUNTER — Telehealth: Payer: Self-pay

## 2016-03-28 NOTE — Progress Notes (Signed)
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  Left detailed message regarding transmission.  Transmission reviewed.    Thoracic impedance normal   Prescribed dosage: Furosemide 20 mg 1 tablet daily  Recommendations: Left voice mail with ICM number and encouraged to call for fluid symptoms.  Follow-up plan: ICM clinic phone appointment on 04/28/2016.  Copy of ICM check sent to device physician.   3 month ICM trend: 03/28/2016   1 Year ICM trend:      Rosalene Billings, RN 03/28/2016 2:44 PM

## 2016-03-28 NOTE — Telephone Encounter (Signed)
Remote ICM transmission received.  Attempted call to daughter and left detailed message regarding transmission and next ICM scheduled for 04/28/2016.  Advised to return call for any fluid symptoms or questions.

## 2016-04-16 ENCOUNTER — Other Ambulatory Visit: Payer: Self-pay

## 2016-04-16 MED ORDER — LOSARTAN POTASSIUM 25 MG PO TABS: 25.0000 mg | ORAL_TABLET | Freq: Every day | ORAL | 5 refills | Status: DC

## 2016-04-28 ENCOUNTER — Ambulatory Visit (INDEPENDENT_AMBULATORY_CARE_PROVIDER_SITE_OTHER): Payer: Medicare Other

## 2016-04-28 DIAGNOSIS — I5022 Chronic systolic (congestive) heart failure: Secondary | ICD-10-CM | POA: Diagnosis not present

## 2016-04-28 DIAGNOSIS — Z95810 Presence of automatic (implantable) cardiac defibrillator: Secondary | ICD-10-CM

## 2016-04-28 NOTE — Progress Notes (Signed)
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  She stated patient had a cough a few days ago but is doing fine now.     Thoracic impedance normal.  Prescribed dosage: Furosemide 20 mg 1 tablet daily  Recommendations: No changes. . Encouraged to call for fluid symptoms.  Follow-up plan: ICM clinic phone appointment on 05/29/2016.  Office appointment scheduled on 07/09/2016 with [name].  Copy of ICM check sent to device physician.   3 month ICM trend: 04/28/2016   1 Year ICM trend:      Rosalene Billings, RN 04/28/2016 10:47 AM

## 2016-05-29 ENCOUNTER — Ambulatory Visit (INDEPENDENT_AMBULATORY_CARE_PROVIDER_SITE_OTHER): Payer: Medicare Other

## 2016-05-29 DIAGNOSIS — Z95810 Presence of automatic (implantable) cardiac defibrillator: Secondary | ICD-10-CM

## 2016-05-29 DIAGNOSIS — I5022 Chronic systolic (congestive) heart failure: Secondary | ICD-10-CM

## 2016-05-30 NOTE — Progress Notes (Signed)
EPIC Encounter for ICM Monitoring  Patient Name: [name] Koopmann, [name] [name] is a 83 y.o. male Date: 05/30/2016 Primary Care Physican: [name], [name] [name] Primary Cardiologist:Nishan Electrophysiologist: Allred Dry Weight:unknown                                         Attempted call to daughter [name] [name] and unable to reach.  Left detailed message regarding transmission.  Transmission reviewed.    Thoracic impedance is normal but was abnormal suggesting fluid accumulation from 05/10/2016 to 05/18/2016.  Prescribed dosage: Furosemide 20 mg 1 tablet daily  Recommendations: Left voice mail with ICM number and encouraged to call for fluid symptoms.  Follow-up plan: ICM clinic phone appointment on 07/08/2016.  Office appointment scheduled on 07/09/2016 with [name].  Copy of ICM check sent to device physician.   3 month ICM trend: 05/29/2016   1 Year ICM trend:      Rosalene Billings, RN 05/30/2016 11:17 AM

## 2016-07-06 NOTE — Progress Notes (Signed)
Patient ID: [name] Azure, MD, male   DOB: 1925-04-24, 83 y.o.   MRN: [id number]    Cardiology Office Note    Date:  07/09/2016   ID:  [name] Azure, MD, DOB 05-31-1925, MRN [id number]  PCP:  [name], MD  Cardiologist: Dr. Johnsie Cancel Dr. 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   In 2011, he survived a prolonged out of hospital cardiac arrest with ventricular fibrillation. He was found to have CAD with stents in RCA/circumflex and RI. EF 31% with large area of scar by MRI. St Jude AICD subsequently placed. He has not been hospitalized for his heart since then with no AICD d/c and no clinical CHF. Last 2D ECHO in 2015 showed EF 45-50%.   Seen 06/06/15 in office with concern for pneumonia  Cough and dyspnea. Always has mechanical breathing issues CXR with mild CE no penumonia 06/14/15 Barrium Swallow Presbyesophagus  Latter admitted with pyelonephritis, sepsis with elevated lactate levels. ASA stopped due to hematuria Since d/c slow improvement.   Coreview valvues normal in November   Some depression over lack of physical abilities Needs sleep study but doesn't want to go  Lab Results  Component Value Date   CREATININE 1.24 06/19/2015   BUN 36 (H) 06/19/2015   NA 139 06/19/2015   K 4.7 06/19/2015   CL 99 (L) 06/19/2015   CO2 31 06/19/2015    BNP    Component Value Date/Time   BNP 114.2 (H) 06/11/2015 0145   BNP 45.9 06/06/2015 1629    ProBNP    Component Value Date/Time   PROBNP 759.3 (H) 11/11/2013 2139      Past Medical History:  Diagnosis Date  . Anemia, unspecified   . CAD (coronary artery disease)   . Cataracts, bilateral   . CHF (congestive heart failure) ([employer])     . Chronic airway obstruction, not elsewhere classified    on o2 at night  . Diabetes mellitus   . Diverticulosis of colon (without mention of hemorrhage)   . Dyslipidemia   . Emphysema   . Hemorrhoids   . ICD (implantable cardiac defibrillator) in place    st jude  . Ischemic cardiomyopathy   . Macular degeneration   . MI, old 2010 or 2011  . Prostatic hypertrophy    hx of  . Retention of urine, unspecified   . Unspecified disorder resulting from impaired renal function     Past Surgical History:  Procedure Laterality Date  . CARDIAC DEFIBRILLATOR PLACEMENT  2011   st jude  . CATARACT EXTRACTION    . PACEMAKER INSERTION  2011  . pci     4/11  . TONSILLECTOMY  83 yo    Current Medications: Outpatient Medications Prior to Visit  Medication Sig Dispense Refill  . ALPRAZolam (XANAX) 0.25 MG tablet Take 0.25 mg by mouth at bedtime.     Marland Kitchen aspirin EC 81 MG tablet Take 1 tablet (81 mg total) by mouth at bedtime. 30 tablet 0  . budesonide (PULMICORT) 0.5 MG/2ML nebulizer solution Take 2 mLs (0.5 mg total) by nebulization 2 (two) times daily. 120 mL 3  . clopidogrel (PLAVIX) 75 MG tablet Take 1  tablet (75 mg total) by mouth daily. 90 tablet 1  . furosemide (LASIX) 20 MG tablet Take 1 tablet (20 mg total) by mouth daily. 30 tablet 0  . guaiFENesin (MUCINEX) 600 MG 12 hr tablet Take 600 mg by mouth 2 (two) times daily.    Marland Kitchen ibuprofen (ADVIL,MOTRIN) 200 MG tablet Take 200 mg by mouth every 6 (six) hours as needed for mild pain.    Marland Kitchen losartan (COZAAR) 25 MG tablet Take 1 tablet (25 mg total) by mouth daily. 30 tablet 5  . metoprolol tartrate (LOPRESSOR) 25 MG tablet Take 12.5 mg by mouth 2 (two) times daily. Reported on 06/06/2015    . nitroGLYCERIN (NITROSTAT) 0.4 MG SL tablet Place 0.4 mg under the tongue every 5 (five) minutes as needed for chest pain. UP TO 3 DOSES THEN CALL EMS    . OXYGEN Inhale into the lungs at bedtime.    . rosuvastatin (CRESTOR) 5 MG tablet Take 1 tablet (5 mg  total) by mouth daily at 6 PM. 30 tablet 3  . simethicone (MYLICON) 992 MG chewable tablet Chew 125 mg by mouth every 6 (six) hours as needed for flatulence.    . trimethoprim (TRIMPEX) 100 MG tablet Take 100 mg by mouth daily.    . vitamin C (ASCORBIC ACID) 500 MG tablet Take 500 mg by mouth daily.     No facility-administered medications prior to visit.      Allergies:   Patient has no known allergies.   Social History   Social History  . Marital status: Married    Spouse name: N/A  . Number of children: N/A  . 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 Smoking status: Former Smoker    Types: Cigarettes    Quit date: 04/27/2009  . Smokeless tobacco: Never Used  . Alcohol use Yes     Comment: occasional beer  . Drug use: No  . Sexual activity: No   Other Topics Concern  . None   Social History Narrative   Lives in a house with stairs, which he needs to use, with his wife.  Wife is not able-bodied.  Does not use a cane or walker, but is unsteady on his feet.        [name] Forner:  [phone] cell, [phone], daughter, POA.     Boyd Kerbs:  [phone], nephew   Juluis Pitch:  [phone], daughter              Family History:  The patient's family history includes Atrial fibrillation in his daughter; Breast cancer in his sister; Congestive Heart Failure in his mother; Coronary artery disease in his mother; Heart attack in his brother; Lung cancer in his father; Stroke in his mother.   ROS:   Please see the history of present illness.    ROS All other systems reviewed and are negative.   PHYSICAL EXAM:   VS:  BP 110/60   Pulse 66   Ht 6' (1.829 m)   Wt 77.9 kg (171 lb 12.8 oz)   SpO2 96%   BMI 23.30 kg/m    Affect appropriate Elderly [place] male  HEENT: normal Neck supple with no adenopathy JVP normal no bruits no thyromegaly Lungs clear with no wheezing and good diaphragmatic  motion Upper airway mechanical stridor chronic Heart:  S1/S2 no murmur, no rub, gallop or click AICD under left clavicle  PMI normal Abdomen: benighn,  BS positve, no tenderness, no AAA no bruit.  No HSM or HJR Distal pulses intact with no bruits Plus one edema with varicosities Neuro non-focal Skin warm and dry No muscular weakness   Wt Readings from Last 3 Encounters:  07/09/16 77.9 kg (171 lb 12.8 oz)  01/10/16 77.5 kg (170 lb 12.8 oz)  10/10/15 78.5 kg (173 lb)      Studies/Labs Reviewed:   EKG:  EKG is ordered today.  NSR with PAC HR 77  07/09/16  SR rate 63 normal   Recent Labs: No results found for requested labs within last 8760 hours.   Lipid Panel No results found for: CHOL, TRIG, HDL, CHOLHDL, VLDL, LDLCALC, LDLDIRECT  Additional studies/ records that were reviewed today include:  LHC in 2011: PCI/DES to RI, dLCx and RCA.   2D ECHO: 11/15/2013 LV EF: 45% -  50% Study Conclusions - Left ventricle: Distal septal and apical hypokinesis. The cavity size was mildly dilated. Systolic function was mildly reduced. The estimated ejection fraction was in the range of 45% to 50%. - Aortic valve: Small systolic gradient without significant stenosis Valve area (VTI): 1.8 cm^2. Valve area (Vmax): 1.72 cm^2. - Mitral valve: Calcified annulus. Mildly thickened leaflets . - Left atrium: The atrium was mildly dilated. - Atrial septum: No defect or patent foramen ovale was identified. - Pulmonary arteries: PA peak pressure: 40 mm Hg (S).  ASSESSMENT:    1. Chronic systolic CHF (congestive heart failure) ([facility])      PLAN:  In order of problems listed above:  Dyspnea:  Improved CHF euvolemic continue current dose diuretic His impedence drops don't Always correlate with elevated BNP. Told him to take lasix when he is dyspnic and transmit Reading from CRT/AICD.  ICM monitoring normal last month   CAD s/p DES to RI, dLCX and RCA (2011):  Asa held . Continue BB  and statin.   H/O VF cardiac arrest: s/p St. Jude ICd followed by Dr. Rayann Heman.  HLD: continue statin.   COPD: stable. followed by [facility] Gwenette G[facility] suggested 6 minute walk test to qualify for oxygen  CKD: at baseline will not over diurese  Major issue that lead to bacteremia is his self cathing with foley Which is occasionally traumatic. Given CAD important to restart ASA ASAP but cannot While post nephritis hematuria continues  Above complex issues discussed with daughter [name] who will be with Dr [name] when he sees urology   Jenkins Rouge

## 2016-07-07 ENCOUNTER — Other Ambulatory Visit: Payer: Self-pay | Admitting: *Deleted

## 2016-07-07 MED ORDER — CLOPIDOGREL BISULFATE 75 MG PO TABS: 75.0000 mg | ORAL_TABLET | Freq: Every day | ORAL | 1 refills | Status: DC

## 2016-07-08 ENCOUNTER — Ambulatory Visit (INDEPENDENT_AMBULATORY_CARE_PROVIDER_SITE_OTHER): Payer: Medicare Other

## 2016-07-08 DIAGNOSIS — I5022 Chronic systolic (congestive) heart failure: Secondary | ICD-10-CM | POA: Diagnosis not present

## 2016-07-08 DIAGNOSIS — Z95810 Presence of automatic (implantable) cardiac defibrillator: Secondary | ICD-10-CM

## 2016-07-08 NOTE — Progress Notes (Signed)
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 Heart Failure questions reviewed, pt asymptomatic.   Thoracic impedance normal.  Prescribed dosage: Furosemide 20 mg 1 tablet daily  Recommendations: No changes.  Advised to limit salt intake to 2000 mg/day and fluid intake to < 2 liters/day.  Encouraged to call for fluid symptoms.  Follow-up plan: ICM clinic phone appointment on 08/11/2016.  Office appointment scheduled on 07/09/2016 with Dr. Johnsie Cancel.  Copy of ICM check sent to primary cardiologist and device physician.   3 month ICM trend: 07/08/2016   1 Year ICM trend:      Rosalene Billings, RN 07/08/2016 10:07 AM

## 2016-07-09 ENCOUNTER — Encounter: Payer: Self-pay | Admitting: Cardiovascular Disease

## 2016-07-09 ENCOUNTER — Ambulatory Visit (INDEPENDENT_AMBULATORY_CARE_PROVIDER_SITE_OTHER): Payer: Medicare Other | Admitting: Cardiovascular Disease

## 2016-07-09 VITALS — BP 110/60 | HR 66 | Ht 72.0 in | Wt 171.8 lb

## 2016-07-09 DIAGNOSIS — I5022 Chronic systolic (congestive) heart failure: Secondary | ICD-10-CM

## 2016-07-09 NOTE — Patient Instructions (Signed)

## 2016-07-22 DIAGNOSIS — F419 Anxiety disorder, unspecified: Secondary | ICD-10-CM | POA: Diagnosis not present

## 2016-07-22 DIAGNOSIS — N183 Chronic kidney disease, stage 3 (moderate): Secondary | ICD-10-CM | POA: Diagnosis not present

## 2016-07-22 DIAGNOSIS — Z95810 Presence of automatic (implantable) cardiac defibrillator: Secondary | ICD-10-CM | POA: Diagnosis not present

## 2016-07-22 DIAGNOSIS — D696 Thrombocytopenia, unspecified: Secondary | ICD-10-CM | POA: Diagnosis not present

## 2016-07-22 DIAGNOSIS — J449 Chronic obstructive pulmonary disease, unspecified: Secondary | ICD-10-CM | POA: Diagnosis not present

## 2016-07-22 DIAGNOSIS — I252 Old myocardial infarction: Secondary | ICD-10-CM | POA: Diagnosis not present

## 2016-07-22 DIAGNOSIS — J9611 Chronic respiratory failure with hypoxia: Secondary | ICD-10-CM | POA: Diagnosis not present

## 2016-07-22 DIAGNOSIS — I10 Essential (primary) hypertension: Secondary | ICD-10-CM | POA: Diagnosis not present

## 2016-07-22 DIAGNOSIS — E119 Type 2 diabetes mellitus without complications: Secondary | ICD-10-CM | POA: Diagnosis not present

## 2016-08-11 ENCOUNTER — Ambulatory Visit (INDEPENDENT_AMBULATORY_CARE_PROVIDER_SITE_OTHER): Payer: Medicare Other | Admitting: *Deleted

## 2016-08-11 DIAGNOSIS — I255 Ischemic cardiomyopathy: Secondary | ICD-10-CM

## 2016-08-11 DIAGNOSIS — Z95810 Presence of automatic (implantable) cardiac defibrillator: Secondary | ICD-10-CM

## 2016-08-11 DIAGNOSIS — I5022 Chronic systolic (congestive) heart failure: Secondary | ICD-10-CM | POA: Diagnosis not present

## 2016-08-11 NOTE — Progress Notes (Signed)
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 Heart Failure questions reviewed, pt asymptomatic.   Thoracic impedance normal.  Prescribed dosage: Furosemide 20 mg 1 tablet daily  Recommendations: No changes.  Encouraged to call for fluid symptoms.  Follow-up plan: ICM clinic phone appointment on 09/11/2016.   Copy of ICM check sent to device physician.   3 month ICM trend: 08/11/2016   1 Year ICM trend:      Rosalene Billings, RN 08/11/2016 10:51 AM

## 2016-08-12 ENCOUNTER — Other Ambulatory Visit: Payer: Self-pay | Admitting: Cardiovascular Disease

## 2016-08-12 MED ORDER — ROSUVASTATIN CALCIUM 5 MG PO TABS: 5.0000 mg | ORAL_TABLET | Freq: Every day | ORAL | 3 refills | Status: DC

## 2016-08-13 ENCOUNTER — Encounter: Payer: Self-pay | Admitting: Cardiology

## 2016-08-13 LAB — CUP PACEART REMOTE DEVICE CHECK
Battery Voltage: 2.92 V
Brady Statistic AP VP Percent: 1 %
Brady Statistic AP VS Percent: 1 %
Brady Statistic AS VP Percent: 1 %
Brady Statistic RA Percent Paced: 1 %
Brady Statistic RV Percent Paced: 1 %
Date Time Interrogation Session: 20180716082238
HighPow Impedance: 71 Ohm
HighPow Impedance: 71 Ohm
Implantable Lead Implant Date: 20111117
Implantable Lead Location: 753859
Implantable Lead Location: 753860
Implantable Lead Model: 7122
Lead Channel Impedance Value: 410 Ohm
Lead Channel Pacing Threshold Amplitude: 1 V
Lead Channel Pacing Threshold Pulse Width: 0.5 ms
Lead Channel Sensing Intrinsic Amplitude: 10.6 mV
Lead Channel Sensing Intrinsic Amplitude: 2.8 mV
Lead Channel Setting Pacing Amplitude: 2 V
Lead Channel Setting Pacing Pulse Width: 0.5 ms
MDC IDC LEAD IMPLANT DT: 20111117
MDC IDC MSMT BATTERY REMAINING LONGEVITY: 46 mo
MDC IDC MSMT BATTERY REMAINING PERCENTAGE: 41 %
MDC IDC MSMT LEADCHNL RA PACING THRESHOLD AMPLITUDE: 0.5 V
MDC IDC MSMT LEADCHNL RV IMPEDANCE VALUE: 360 Ohm
MDC IDC MSMT LEADCHNL RV PACING THRESHOLD PULSEWIDTH: 0.5 ms
MDC IDC PG IMPLANT DT: 20111117
MDC IDC PG SERIAL: 623396
MDC IDC SET LEADCHNL RV PACING AMPLITUDE: 2.5 V
MDC IDC SET LEADCHNL RV SENSING SENSITIVITY: 0.5 mV
MDC IDC STAT BRADY AS VS PERCENT: 99 %

## 2016-09-11 ENCOUNTER — Ambulatory Visit (INDEPENDENT_AMBULATORY_CARE_PROVIDER_SITE_OTHER): Payer: Medicare Other

## 2016-09-11 DIAGNOSIS — Z95810 Presence of automatic (implantable) cardiac defibrillator: Secondary | ICD-10-CM | POA: Diagnosis not present

## 2016-09-11 DIAGNOSIS — I5022 Chronic systolic (congestive) heart failure: Secondary | ICD-10-CM | POA: Diagnosis not present

## 2016-09-11 NOTE — Progress Notes (Signed)
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  Heart Failure questions reviewed, pt asymptomatic.   Thoracic impedance normal but was abnormal suggesting fluid accumulation from 7/28 to 8/6.  He did not have any symptoms during time of decreased impedance  Prescribed dosage:  Furosemide 20 mg 1 tablet daily  Recommendations: No changes.  Advised to limit salt intake to 2000 mg/day and fluid intake to < 2 liters/day.  Encouraged to call for fluid symptoms.  Follow-up plan: ICM clinic phone appointment on 10/13/2016. Advised patient is due for yearly appointment with Dr Rayann Heman  Copy of ICM check sent to device physician.   3 month ICM trend: 09/11/2016   1 Year ICM trend:      Rosalene Billings, RN 09/11/2016 9:04 AM

## 2016-10-13 ENCOUNTER — Ambulatory Visit (INDEPENDENT_AMBULATORY_CARE_PROVIDER_SITE_OTHER): Payer: Medicare Other

## 2016-10-13 DIAGNOSIS — Z95810 Presence of automatic (implantable) cardiac defibrillator: Secondary | ICD-10-CM

## 2016-10-13 DIAGNOSIS — I5022 Chronic systolic (congestive) heart failure: Secondary | ICD-10-CM | POA: Diagnosis not present

## 2016-10-13 NOTE — Progress Notes (Signed)
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   Thoracic impedance normal.  Prescribed dosage: Furosemide 20 mg 1 tablet daily  Recommendations: No changes.   Encouraged to call for fluid symptoms.  Follow-up plan: ICM clinic phone appointment on 11/13/2016.   Copy of ICM check sent to Dr. Rayann Heman.   3 month ICM trend: 10/13/2016    1 Year ICM trend:      Rosalene Billings, RN 10/13/2016 12:15 PM

## 2016-10-15 DIAGNOSIS — N3020 Other chronic cystitis without hematuria: Secondary | ICD-10-CM | POA: Diagnosis not present

## 2016-10-15 DIAGNOSIS — R339 Retention of urine, unspecified: Secondary | ICD-10-CM | POA: Diagnosis not present

## 2016-10-15 DIAGNOSIS — N401 Enlarged prostate with lower urinary tract symptoms: Secondary | ICD-10-CM | POA: Diagnosis not present

## 2016-10-24 ENCOUNTER — Encounter: Payer: Self-pay | Admitting: Adult Health

## 2016-10-24 ENCOUNTER — Ambulatory Visit (INDEPENDENT_AMBULATORY_CARE_PROVIDER_SITE_OTHER): Payer: Medicare Other | Admitting: Adult Health

## 2016-10-24 DIAGNOSIS — N183 Chronic kidney disease, stage 3 (moderate): Secondary | ICD-10-CM | POA: Diagnosis not present

## 2016-10-24 DIAGNOSIS — J449 Chronic obstructive pulmonary disease, unspecified: Secondary | ICD-10-CM | POA: Diagnosis not present

## 2016-10-24 DIAGNOSIS — I252 Old myocardial infarction: Secondary | ICD-10-CM | POA: Diagnosis not present

## 2016-10-24 DIAGNOSIS — I255 Ischemic cardiomyopathy: Secondary | ICD-10-CM | POA: Diagnosis not present

## 2016-10-24 DIAGNOSIS — M1990 Unspecified osteoarthritis, unspecified site: Secondary | ICD-10-CM | POA: Diagnosis not present

## 2016-10-24 DIAGNOSIS — I10 Essential (primary) hypertension: Secondary | ICD-10-CM | POA: Diagnosis not present

## 2016-10-24 DIAGNOSIS — J441 Chronic obstructive pulmonary disease with (acute) exacerbation: Secondary | ICD-10-CM | POA: Diagnosis not present

## 2016-10-24 DIAGNOSIS — J9611 Chronic respiratory failure with hypoxia: Secondary | ICD-10-CM

## 2016-10-24 DIAGNOSIS — N401 Enlarged prostate with lower urinary tract symptoms: Secondary | ICD-10-CM | POA: Diagnosis not present

## 2016-10-24 DIAGNOSIS — E119 Type 2 diabetes mellitus without complications: Secondary | ICD-10-CM | POA: Diagnosis not present

## 2016-10-24 DIAGNOSIS — I2510 Atherosclerotic heart disease of native coronary artery without angina pectoris: Secondary | ICD-10-CM | POA: Diagnosis not present

## 2016-10-24 DIAGNOSIS — E782 Mixed hyperlipidemia: Secondary | ICD-10-CM | POA: Diagnosis not present

## 2016-10-24 NOTE — Assessment & Plan Note (Signed)
Cont on O2 2l/m with act and At bedtime .

## 2016-10-24 NOTE — Patient Instructions (Addendum)
Continue on Brovana and Pulmicort neb Twice daily .  Continue on Oxygen 2l/m with activity and At bedtime.  Will send order for nebs to DME .  Call back if you need order for POC .  Follow up in 6 months and As needed

## 2016-10-24 NOTE — Assessment & Plan Note (Signed)
Compensated on present regimen  Send nebs to DME .   Plan  Patient Instructions  Continue on Brovana and Pulmicort neb Twice daily .  Continue on Oxygen 2l/m with activity and At bedtime.  Will send order for nebs to DME .  Call back if you need order for POC .  Follow up in 6 months and As needed

## 2016-10-24 NOTE — Progress Notes (Signed)
Chart and office note reviewed in detail  > agree with a/p as outlined    

## 2016-10-24 NOTE — Progress Notes (Signed)
@Patient  ID: [name] Azure, MD, male    DOB: 1925/07/14, 83 y.o.   MRN: [id number]  Chief Complaint  Patient presents with  . Follow-up    Asthma     Referring provider: [name], MD  HPI: 83 year old male, previous dentist, former smoker followed for Asthma/COPD  Hx Diastolic CHF   TEST  PFT's 04/16/11 FEV1  1.57 (60%) ratio 71 and 12% better p B2, dlco 41 corrects to 58  10/24/2016 Follow up : Asthma , O2 RF  Patient returns for a follow-up and to reestablish with pulmonary. Patient was last seen in 2015. Since last visit. Patient says that he He remains on Pulmicort and Brovana nebulizer twice daily.  Uses Albuterol neb As needed   He is on oxygen 2 L with activity and at bedtime. Wants to get a POC . Waiting to hear from PCP to see if order went through.  O2 sat 88% walking on room air, O2 sats 95%.  He lives at home, takes him 1 hr to get ready in the morning . Uses a scooter.  Has a stair lift at home. He still  Drives.   He was seeing the [place] but is not longer going there.   No Known Allergies  Immunization History  Administered Date(s) Administered  . Influenza Split 10/28/2010, 10/27/2012  . Influenza Whole 12/08/2011  . Influenza, High Dose Seasonal PF 10/16/2016  . Pneumococcal Conjugate-13 05/26/2013  . Pneumococcal Polysaccharide-23 10/31/1998    Past Medical History:  Diagnosis Date  . Anemia, unspecified   . CAD (coronary artery disease)   . Cataracts, bilateral   . CHF (congestive heart failure) ([place])   . Chronic airway obstruction, not elsewhere classified    on o2 at night  . Diabetes mellitus   . Diverticulosis of colon (without mention of hemorrhage)   . Dyslipidemia   . Emphysema   . Hemorrhoids   . ICD (implantable cardiac defibrillator) in place    st jude  . Ischemic cardiomyopathy   . Macular degeneration   . MI, old 2010 or 2011  . Prostatic hypertrophy    hx of  . Retention of urine, unspecified   . Unspecified disorder  resulting from impaired renal function     Tobacco History: History  Smoking Status  . Former Smoker  . Types: Cigarettes  . Quit date: 04/27/2009  Smokeless Tobacco  . Never Used   Counseling given: Not Answered   Outpatient Encounter Prescriptions as of 10/24/2016  Medication Sig  . albuterol (PROVENTIL) (2.5 MG/3ML) 0.083% nebulizer solution Take 2.5 mg by nebulization every 4 (four) hours as needed for wheezing or shortness of breath.  . ALPRAZolam (XANAX) 0.25 MG tablet Take 0.25 mg by mouth at bedtime.   Marland Kitchen arformoterol (BROVANA) 15 MCG/2ML NEBU Take 15 mcg by nebulization 2 (two) times daily.  Marland Kitchen aspirin EC 81 MG tablet Take 1 tablet (81 mg total) by mouth at bedtime.  . budesonide (PULMICORT) 0.5 MG/2ML nebulizer solution Take 2 mLs (0.5 mg total) by nebulization 2 (two) times daily.  . clopidogrel (PLAVIX) 75 MG tablet Take 1 tablet (75 mg total) by mouth daily.  Marland Kitchen guaiFENesin (MUCINEX) 600 MG 12 hr tablet Take 600 mg by mouth 2 (two) times daily.  Marland Kitchen ibuprofen (ADVIL,MOTRIN) 200 MG tablet Take 200 mg by mouth every 6 (six) hours as needed for mild pain.  Marland Kitchen losartan (COZAAR) 25 MG tablet Take 1 tablet (25 mg total) by mouth daily.  . metoprolol tartrate (LOPRESSOR)  25 MG tablet Take 12.5 mg by mouth 2 (two) times daily. Reported on 06/06/2015  . nitroGLYCERIN (NITROSTAT) 0.4 MG SL tablet Place 0.4 mg under the tongue every 5 (five) minutes as needed for chest pain. UP TO 3 DOSES THEN CALL EMS  . OXYGEN Inhale into the lungs at bedtime.  . rosuvastatin (CRESTOR) 5 MG tablet Take 1 tablet (5 mg total) by mouth daily at 6 PM.  . trimethoprim (TRIMPEX) 100 MG tablet Take 100 mg by mouth daily.  . [DISCONTINUED] furosemide (LASIX) 20 MG tablet Take 1 tablet (20 mg total) by mouth daily. (Patient not taking: Reported on 10/24/2016)  . [DISCONTINUED] simethicone (MYLICON) 956 MG chewable tablet Chew 125 mg by mouth every 6 (six) hours as needed for flatulence.  . [DISCONTINUED] vitamin C  (ASCORBIC ACID) 500 MG tablet Take 500 mg by mouth daily.   No facility-administered encounter medications on file as of 10/24/2016.      Review of Systems  Constitutional:   No  weight loss, night sweats,  Fevers, chills, fatigue, or  lassitude.  HEENT:   No headaches,  Difficulty swallowing,  Tooth/dental problems, or  Sore throat,                No sneezing, itching, ear ache, nasal congestion, post nasal drip,   CV:  No chest pain,  Orthopnea, PND, swelling in lower extremities, anasarca, dizziness, palpitations, syncope.   GI  No heartburn, indigestion, abdominal pain, nausea, vomiting, diarrhea, change in bowel habits, loss of appetite, bloody stools.   Resp:    No chest wall deformity  Skin: no rash or lesions.  GU: no dysuria, change in color of urine, no urgency or frequency.  No flank pain, no hematuria   MS:  No joint pain or swelling.  No decreased range of motion.  No back pain.    Physical Exam  BP 110/64 (BP Location: Left Arm, Cuff Size: Normal)   Pulse 77   Ht 6' (1.829 m)   Wt 168 lb 12.8 oz (76.6 kg)   SpO2 95%   BMI 22.89 kg/m   GEN: A/Ox3; pleasant , NAD, thin and frail , in wc    HEENT:  [place]/AT,  EACs-clear, TMs-wnl, NOSE-clear, THROAT-clear, no lesions, no postnasal drip or exudate noted.   NECK:  Supple w/ fair ROM; no JVD; normal carotid impulses w/o bruits; no thyromegaly or nodules palpated; no lymphadenopathy.    RESP  diminshed BS in bases ,  no accessory muscle use, no dullness to percussion  CARD:  RRR, no m/r/g, no peripheral edema, pulses intact, no cyanosis or clubbing.  GI:   Soft & nt; nml bowel sounds; no organomegaly or masses detected.   Musco: Warm bil, no deformities or joint swelling noted.   Neuro: alert, no focal deficits noted.    Skin: Warm, no lesions or rashes    Lab Results:  CBC   Imaging: No results found.   Assessment & Plan:   COPD (chronic obstructive pulmonary disease) Compensated on present  regimen  Send nebs to DME .   Plan  Patient Instructions  Continue on Brovana and Pulmicort neb Twice daily .  Continue on Oxygen 2l/m with activity and At bedtime.  Will send order for nebs to DME .  Call back if you need order for POC .  Follow up in 6 months and As needed       Chronic respiratory failure with hypoxia ([place]) Cont on O2 2l/m with act  and At bedtime .      Rexene Edison, NP 10/24/2016

## 2016-10-29 ENCOUNTER — Inpatient Hospital Stay (HOSPITAL_COMMUNITY)
Admission: EM | Admit: 2016-10-29 | Discharge: 2016-11-02 | DRG: 871 | Disposition: A | Discharge status: Home Health | Payer: Medicare Other | Attending: Internal Medicine | Admitting: Internal Medicine

## 2016-10-29 ENCOUNTER — Emergency Department (HOSPITAL_COMMUNITY): Payer: Medicare Other

## 2016-10-29 ENCOUNTER — Telehealth: Payer: Self-pay | Admitting: Cardiovascular Disease

## 2016-10-29 ENCOUNTER — Encounter (HOSPITAL_COMMUNITY): Payer: Self-pay | Admitting: Emergency Medicine

## 2016-10-29 ENCOUNTER — Inpatient Hospital Stay (HOSPITAL_COMMUNITY): Payer: Medicare Other

## 2016-10-29 DIAGNOSIS — E785 Hyperlipidemia, unspecified: Secondary | ICD-10-CM | POA: Diagnosis present

## 2016-10-29 DIAGNOSIS — E860 Dehydration: Secondary | ICD-10-CM | POA: Diagnosis present

## 2016-10-29 DIAGNOSIS — I252 Old myocardial infarction: Secondary | ICD-10-CM | POA: Diagnosis not present

## 2016-10-29 DIAGNOSIS — E1122 Type 2 diabetes mellitus with diabetic chronic kidney disease: Secondary | ICD-10-CM | POA: Diagnosis present

## 2016-10-29 DIAGNOSIS — Z87891 Personal history of nicotine dependence: Secondary | ICD-10-CM

## 2016-10-29 DIAGNOSIS — K5730 Diverticulosis of large intestine without perforation or abscess without bleeding: Secondary | ICD-10-CM | POA: Diagnosis present

## 2016-10-29 DIAGNOSIS — Z955 Presence of coronary angioplasty implant and graft: Secondary | ICD-10-CM

## 2016-10-29 DIAGNOSIS — R05 Cough: Secondary | ICD-10-CM | POA: Diagnosis not present

## 2016-10-29 DIAGNOSIS — D509 Iron deficiency anemia, unspecified: Secondary | ICD-10-CM | POA: Diagnosis not present

## 2016-10-29 DIAGNOSIS — I5032 Chronic diastolic (congestive) heart failure: Secondary | ICD-10-CM

## 2016-10-29 DIAGNOSIS — Z7982 Long term (current) use of aspirin: Secondary | ICD-10-CM

## 2016-10-29 DIAGNOSIS — H3530 Unspecified macular degeneration: Secondary | ICD-10-CM | POA: Diagnosis present

## 2016-10-29 DIAGNOSIS — J9621 Acute and chronic respiratory failure with hypoxia: Secondary | ICD-10-CM | POA: Diagnosis not present

## 2016-10-29 DIAGNOSIS — A419 Sepsis, unspecified organism: Secondary | ICD-10-CM | POA: Diagnosis not present

## 2016-10-29 DIAGNOSIS — E875 Hyperkalemia: Secondary | ICD-10-CM | POA: Diagnosis not present

## 2016-10-29 DIAGNOSIS — J189 Pneumonia, unspecified organism: Secondary | ICD-10-CM | POA: Diagnosis not present

## 2016-10-29 DIAGNOSIS — Z7951 Long term (current) use of inhaled steroids: Secondary | ICD-10-CM

## 2016-10-29 DIAGNOSIS — J181 Lobar pneumonia, unspecified organism: Secondary | ICD-10-CM | POA: Diagnosis not present

## 2016-10-29 DIAGNOSIS — R0682 Tachypnea, not elsewhere classified: Secondary | ICD-10-CM | POA: Diagnosis not present

## 2016-10-29 DIAGNOSIS — J9602 Acute respiratory failure with hypercapnia: Secondary | ICD-10-CM | POA: Insufficient documentation

## 2016-10-29 DIAGNOSIS — R1310 Dysphagia, unspecified: Secondary | ICD-10-CM

## 2016-10-29 DIAGNOSIS — Z8249 Family history of ischemic heart disease and other diseases of the circulatory system: Secondary | ICD-10-CM

## 2016-10-29 DIAGNOSIS — R0602 Shortness of breath: Secondary | ICD-10-CM | POA: Diagnosis not present

## 2016-10-29 DIAGNOSIS — D472 Monoclonal gammopathy: Secondary | ICD-10-CM | POA: Diagnosis present

## 2016-10-29 DIAGNOSIS — D638 Anemia in other chronic diseases classified elsewhere: Secondary | ICD-10-CM | POA: Diagnosis present

## 2016-10-29 DIAGNOSIS — I255 Ischemic cardiomyopathy: Secondary | ICD-10-CM | POA: Diagnosis present

## 2016-10-29 DIAGNOSIS — R339 Retention of urine, unspecified: Secondary | ICD-10-CM | POA: Diagnosis present

## 2016-10-29 DIAGNOSIS — Z791 Long term (current) use of non-steroidal anti-inflammatories (NSAID): Secondary | ICD-10-CM

## 2016-10-29 DIAGNOSIS — J440 Chronic obstructive pulmonary disease with acute lower respiratory infection: Secondary | ICD-10-CM | POA: Diagnosis present

## 2016-10-29 DIAGNOSIS — N183 Chronic kidney disease, stage 3 (moderate): Secondary | ICD-10-CM | POA: Diagnosis present

## 2016-10-29 DIAGNOSIS — J9620 Acute and chronic respiratory failure, unspecified whether with hypoxia or hypercapnia: Secondary | ICD-10-CM | POA: Diagnosis present

## 2016-10-29 DIAGNOSIS — J441 Chronic obstructive pulmonary disease with (acute) exacerbation: Secondary | ICD-10-CM | POA: Diagnosis present

## 2016-10-29 DIAGNOSIS — N401 Enlarged prostate with lower urinary tract symptoms: Secondary | ICD-10-CM | POA: Diagnosis present

## 2016-10-29 DIAGNOSIS — Z9981 Dependence on supplemental oxygen: Secondary | ICD-10-CM

## 2016-10-29 DIAGNOSIS — J9612 Chronic respiratory failure with hypercapnia: Secondary | ICD-10-CM | POA: Insufficient documentation

## 2016-10-29 DIAGNOSIS — E872 Acidosis: Secondary | ICD-10-CM | POA: Insufficient documentation

## 2016-10-29 DIAGNOSIS — I5020 Unspecified systolic (congestive) heart failure: Secondary | ICD-10-CM | POA: Diagnosis not present

## 2016-10-29 DIAGNOSIS — R338 Other retention of urine: Secondary | ICD-10-CM | POA: Diagnosis present

## 2016-10-29 DIAGNOSIS — R0600 Dyspnea, unspecified: Secondary | ICD-10-CM

## 2016-10-29 DIAGNOSIS — I509 Heart failure, unspecified: Secondary | ICD-10-CM

## 2016-10-29 DIAGNOSIS — Z8674 Personal history of sudden cardiac arrest: Secondary | ICD-10-CM

## 2016-10-29 DIAGNOSIS — R9431 Abnormal electrocardiogram [ECG] [EKG]: Secondary | ICD-10-CM | POA: Diagnosis not present

## 2016-10-29 DIAGNOSIS — I5022 Chronic systolic (congestive) heart failure: Secondary | ICD-10-CM | POA: Diagnosis present

## 2016-10-29 DIAGNOSIS — N184 Chronic kidney disease, stage 4 (severe): Secondary | ICD-10-CM | POA: Diagnosis not present

## 2016-10-29 DIAGNOSIS — Z7902 Long term (current) use of antithrombotics/antiplatelets: Secondary | ICD-10-CM | POA: Diagnosis not present

## 2016-10-29 DIAGNOSIS — D649 Anemia, unspecified: Secondary | ICD-10-CM | POA: Diagnosis present

## 2016-10-29 DIAGNOSIS — I2510 Atherosclerotic heart disease of native coronary artery without angina pectoris: Secondary | ICD-10-CM | POA: Diagnosis present

## 2016-10-29 DIAGNOSIS — N189 Chronic kidney disease, unspecified: Secondary | ICD-10-CM

## 2016-10-29 DIAGNOSIS — Z95810 Presence of automatic (implantable) cardiac defibrillator: Secondary | ICD-10-CM | POA: Diagnosis present

## 2016-10-29 DIAGNOSIS — N179 Acute kidney failure, unspecified: Secondary | ICD-10-CM | POA: Diagnosis not present

## 2016-10-29 DIAGNOSIS — K5900 Constipation, unspecified: Secondary | ICD-10-CM | POA: Diagnosis present

## 2016-10-29 LAB — I-STAT CG4 LACTIC ACID, ED: Lactic Acid, Venous: 1.95 mmol/L — ABNORMAL HIGH (ref 0.5–1.9)

## 2016-10-29 LAB — FOLATE: FOLATE: 14.6 ng/mL (ref >5.9)

## 2016-10-29 LAB — CBC WITH DIFFERENTIAL/PLATELET
BASOS ABS: 0 10*3/uL (ref 0.0–0.1)
Basophils Relative: 0 %
EOS PCT: 0 %
Eosinophils Absolute: 0 10*3/uL (ref 0.0–0.7)
HCT: 25.2 % — ABNORMAL LOW (ref 39.0–52.0)
Hemoglobin: 8.3 g/dL — ABNORMAL LOW (ref 13.0–17.0)
Lymphocytes Relative: 9 %
Lymphs Abs: 0.6 10*3/uL — ABNORMAL LOW (ref 0.7–4.0)
MCH: 22.9 pg — ABNORMAL LOW (ref 26.0–34.0)
MCHC: 32.9 g/dL (ref 30.0–36.0)
MCV: 69.6 fL — AB (ref 78.0–100.0)
MONO ABS: 0.2 10*3/uL (ref 0.1–1.0)
Monocytes Relative: 3 %
NEUTROS PCT: 88 %
Neutro Abs: 6.4 10*3/uL (ref 1.7–7.7)
PLATELETS: 59 10*3/uL — AB (ref 150–400)
RBC: 3.62 MIL/uL — AB (ref 4.22–5.81)
RDW: 18.7 % — ABNORMAL HIGH (ref 11.5–15.5)
WBC: 7.2 10*3/uL (ref 4.0–10.5)

## 2016-10-29 LAB — COMPREHENSIVE METABOLIC PANEL
ALK PHOS: 43 U/L (ref 38–126)
ALT: 12 U/L — ABNORMAL LOW (ref 17–63)
ANION GAP: 13 (ref 5–15)
AST: 22 U/L (ref 15–41)
Albumin: 3.5 g/dL (ref 3.5–5.0)
BUN: 51 mg/dL — ABNORMAL HIGH (ref 6–20)
CHLORIDE: 100 mmol/L — AB (ref 101–111)
CO2: 23 mmol/L (ref 22–32)
Calcium: 8.7 mg/dL — ABNORMAL LOW (ref 8.9–10.3)
Creatinine, Ser: 2.1 mg/dL — ABNORMAL HIGH (ref 0.61–1.24)
GFR calc Af Amer: 30 mL/min — ABNORMAL LOW (ref >60)
GFR calc non Af Amer: 26 mL/min — ABNORMAL LOW (ref >60)
Glucose, Bld: 106 mg/dL — ABNORMAL HIGH (ref 65–99)
Potassium: 4.7 mmol/L (ref 3.5–5.1)
SODIUM: 136 mmol/L (ref 135–145)
Total Bilirubin: 1 mg/dL (ref 0.3–1.2)
Total Protein: 6.9 g/dL (ref 6.5–8.1)

## 2016-10-29 LAB — URINALYSIS, ROUTINE W REFLEX MICROSCOPIC
Bilirubin Urine: NEGATIVE
GLUCOSE, UA: NEGATIVE mg/dL
KETONES UR: NEGATIVE mg/dL
NITRITE: NEGATIVE
PH: 5 (ref 5.0–8.0)
Protein, ur: 30 mg/dL — AB
SPECIFIC GRAVITY, URINE: 1.015 (ref 1.005–1.030)

## 2016-10-29 LAB — BLOOD GAS, ARTERIAL
Acid-base deficit: 2.2 mmol/L — ABNORMAL HIGH (ref 0.0–2.0)
Bicarbonate: 22 mmol/L (ref 20.0–28.0)
DRAWN BY: 10552
O2 Content: 2 L/min
O2 Saturation: 91.9 %
PCO2 ART: 37.2 mmHg (ref 32.0–48.0)
Patient temperature: 98.6
pH, Arterial: 7.389 (ref 7.350–7.450)
pO2, Arterial: 66.5 mmHg — ABNORMAL LOW (ref 83.0–108.0)

## 2016-10-29 LAB — STREP PNEUMONIAE URINARY ANTIGEN: Strep Pneumo Urinary Antigen: NEGATIVE

## 2016-10-29 LAB — PROTIME-INR
INR: 1.16
PROTHROMBIN TIME: 14.7 s (ref 11.4–15.2)

## 2016-10-29 LAB — PROCALCITONIN: PROCALCITONIN: 4.84 ng/mL

## 2016-10-29 LAB — FERRITIN: FERRITIN: 329 ng/mL (ref 24–336)

## 2016-10-29 LAB — TROPONIN I
TROPONIN I: 0.04 ng/mL — AB (ref <0.03)
Troponin I: 0.05 ng/mL (ref <0.03)
Troponin I: 0.05 ng/mL (ref <0.03)

## 2016-10-29 LAB — RETICULOCYTES
RBC.: 3.31 MIL/uL — ABNORMAL LOW (ref 4.22–5.81)
RETIC COUNT ABSOLUTE: 29.8 10*3/uL (ref 19.0–186.0)
Retic Ct Pct: 0.9 % (ref 0.4–3.1)

## 2016-10-29 LAB — IRON AND TIBC
IRON: 6 ug/dL — AB (ref 45–182)
SATURATION RATIOS: 3 % — AB (ref 17.9–39.5)
TIBC: 216 ug/dL — AB (ref 250–450)
UIBC: 210 ug/dL

## 2016-10-29 LAB — VITAMIN B12: Vitamin B-12: 74 pg/mL — ABNORMAL LOW (ref 180–914)

## 2016-10-29 LAB — GLUCOSE, CAPILLARY: GLUCOSE-CAPILLARY: 155 mg/dL — AB (ref 65–99)

## 2016-10-29 LAB — LACTIC ACID, PLASMA: LACTIC ACID, VENOUS: 2.3 mmol/L — AB (ref 0.5–1.9)

## 2016-10-29 LAB — BRAIN NATRIURETIC PEPTIDE: B NATRIURETIC PEPTIDE 5: 56.8 pg/mL (ref 0.0–100.0)

## 2016-10-29 MED ORDER — ACETAMINOPHEN 325 MG PO TABS: 650.0000 mg | ORAL_TABLET | Freq: Four times a day (QID) | ORAL | Status: DC | PRN

## 2016-10-29 MED ORDER — DEXTROSE 5 % IV SOLN
500.0000 mg | INTRAVENOUS | Status: DC
  Administered 2016-10-30: 500 mg via INTRAVENOUS
  Filled 2016-10-29 (×3): qty 500

## 2016-10-29 MED ORDER — DEXTROSE 5 % IV SOLN
1.0000 g | INTRAVENOUS | Status: DC
  Administered 2016-10-30 – 2016-10-31 (×2): 1 g via INTRAVENOUS
  Filled 2016-10-29 (×3): qty 10

## 2016-10-29 MED ORDER — ONDANSETRON HCL 4 MG/2ML IJ SOLN: 4.0000 mg | Freq: Four times a day (QID) | INTRAMUSCULAR | Status: DC | PRN

## 2016-10-29 MED ORDER — DEXTROSE 5 % IV SOLN
1.0000 g | Freq: Once | INTRAVENOUS | Status: AC
  Administered 2016-10-29: 1 g via INTRAVENOUS
  Filled 2016-10-29: qty 10

## 2016-10-29 MED ORDER — ASPIRIN EC 81 MG PO TBEC
81.0000 mg | DELAYED_RELEASE_TABLET | Freq: Every day | ORAL | Status: DC
  Administered 2016-10-29 – 2016-11-01 (×4): 81 mg via ORAL
  Filled 2016-10-29 (×4): qty 1

## 2016-10-29 MED ORDER — CHLORHEXIDINE GLUCONATE 0.12 % MT SOLN
15.0000 mL | Freq: Two times a day (BID) | OROMUCOSAL | Status: DC
  Administered 2016-10-29 – 2016-11-01 (×6): 15 mL via OROMUCOSAL
  Filled 2016-10-29 (×6): qty 15

## 2016-10-29 MED ORDER — IBUPROFEN 200 MG PO TABS: 200.0000 mg | ORAL_TABLET | Freq: Four times a day (QID) | ORAL | Status: DC | PRN

## 2016-10-29 MED ORDER — GUAIFENESIN ER 600 MG PO TB12
600.0000 mg | ORAL_TABLET | Freq: Two times a day (BID) | ORAL | Status: DC | PRN
  Administered 2016-10-31: 600 mg via ORAL
  Filled 2016-10-29: qty 1

## 2016-10-29 MED ORDER — DOCUSATE SODIUM 100 MG PO CAPS
100.0000 mg | ORAL_CAPSULE | Freq: Two times a day (BID) | ORAL | Status: DC | PRN
Start: 2016-10-29 — End: 2016-11-02

## 2016-10-29 MED ORDER — ACETAMINOPHEN 650 MG RE SUPP
650.0000 mg | Freq: Four times a day (QID) | RECTAL | Status: DC | PRN
  Administered 2016-10-29: 650 mg via RECTAL
  Filled 2016-10-29: qty 1

## 2016-10-29 MED ORDER — ARFORMOTEROL TARTRATE 15 MCG/2ML IN NEBU
15.0000 ug | INHALATION_SOLUTION | Freq: Two times a day (BID) | RESPIRATORY_TRACT | Status: DC
  Administered 2016-10-29 – 2016-11-02 (×9): 15 ug via RESPIRATORY_TRACT
  Filled 2016-10-29 (×9): qty 2

## 2016-10-29 MED ORDER — ALPRAZOLAM 0.25 MG PO TABS: 0.2500 mg | ORAL_TABLET | Freq: Every evening | ORAL | Status: DC | PRN

## 2016-10-29 MED ORDER — METHYLPREDNISOLONE SODIUM SUCC 125 MG IJ SOLR
125.0000 mg | Freq: Once | INTRAMUSCULAR | Status: AC
  Administered 2016-10-29: 125 mg via INTRAVENOUS

## 2016-10-29 MED ORDER — ACETAMINOPHEN 325 MG PO TABS
650.0000 mg | ORAL_TABLET | Freq: Once | ORAL | Status: AC
  Administered 2016-10-29: 650 mg via ORAL
  Filled 2016-10-29: qty 2

## 2016-10-29 MED ORDER — METHYLPREDNISOLONE SODIUM SUCC 125 MG IJ SOLR
INTRAMUSCULAR | Status: AC
  Administered 2016-10-29: 13:00:00
  Filled 2016-10-29: qty 2

## 2016-10-29 MED ORDER — CLOPIDOGREL BISULFATE 75 MG PO TABS
75.0000 mg | ORAL_TABLET | Freq: Every day | ORAL | Status: DC
  Administered 2016-10-30 – 2016-11-01 (×3): 75 mg via ORAL
  Filled 2016-10-29 (×4): qty 1

## 2016-10-29 MED ORDER — DOCUSATE SODIUM 100 MG PO CAPS
100.0000 mg | ORAL_CAPSULE | Freq: Two times a day (BID) | ORAL | Status: DC
  Administered 2016-10-29 – 2016-11-01 (×7): 100 mg via ORAL
  Filled 2016-10-29 (×6): qty 1

## 2016-10-29 MED ORDER — AZITHROMYCIN 500 MG IV SOLR
500.0000 mg | Freq: Once | INTRAVENOUS | Status: AC
  Administered 2016-10-29: 500 mg via INTRAVENOUS
  Filled 2016-10-29: qty 500

## 2016-10-29 MED ORDER — FUROSEMIDE 10 MG/ML IJ SOLN
INTRAMUSCULAR | Status: AC
Start: 2016-10-29 — End: 2016-10-29
  Administered 2016-10-29: 13:00:00
  Filled 2016-10-29: qty 2

## 2016-10-29 MED ORDER — ALBUTEROL SULFATE (2.5 MG/3ML) 0.083% IN NEBU
5.0000 mg | INHALATION_SOLUTION | Freq: Once | RESPIRATORY_TRACT | Status: AC
  Administered 2016-10-29: 5 mg via RESPIRATORY_TRACT
  Filled 2016-10-29: qty 6

## 2016-10-29 MED ORDER — SODIUM CHLORIDE 0.9 % IV SOLN
INTRAVENOUS | Status: AC
  Administered 2016-10-29: 14:00:00 via INTRAVENOUS

## 2016-10-29 MED ORDER — ONDANSETRON HCL 4 MG PO TABS: 4.0000 mg | ORAL_TABLET | Freq: Four times a day (QID) | ORAL | Status: DC | PRN

## 2016-10-29 MED ORDER — BUDESONIDE 0.5 MG/2ML IN SUSP
0.5000 mg | Freq: Two times a day (BID) | RESPIRATORY_TRACT | Status: DC
  Administered 2016-10-29 – 2016-11-02 (×9): 0.5 mg via RESPIRATORY_TRACT
  Filled 2016-10-29 (×10): qty 2

## 2016-10-29 MED ORDER — FUROSEMIDE 10 MG/ML IJ SOLN
20.0000 mg | Freq: Once | INTRAMUSCULAR | Status: AC
  Administered 2016-10-29: 20 mg via INTRAVENOUS

## 2016-10-29 MED ORDER — ORAL CARE MOUTH RINSE
15.0000 mL | Freq: Two times a day (BID) | OROMUCOSAL | Status: DC
  Administered 2016-10-29 – 2016-10-30 (×2): 15 mL via OROMUCOSAL

## 2016-10-29 MED ORDER — HYDROCODONE-ACETAMINOPHEN 5-325 MG PO TABS: 1.0000 | ORAL_TABLET | Freq: Four times a day (QID) | ORAL | Status: DC | PRN

## 2016-10-29 MED ORDER — ALBUTEROL SULFATE (2.5 MG/3ML) 0.083% IN NEBU
2.5000 mg | INHALATION_SOLUTION | RESPIRATORY_TRACT | Status: AC
  Administered 2016-10-29 – 2016-10-30 (×6): 2.5 mg via RESPIRATORY_TRACT
  Filled 2016-10-29 (×6): qty 3

## 2016-10-29 MED ORDER — MAGNESIUM CITRATE PO SOLN
1.0000 | Freq: Once | ORAL | Status: AC | PRN
  Administered 2016-10-31: 1 via ORAL
  Filled 2016-10-29: qty 296

## 2016-10-29 MED ORDER — ROSUVASTATIN CALCIUM 10 MG PO TABS
5.0000 mg | ORAL_TABLET | Freq: Every day | ORAL | Status: DC
  Administered 2016-10-29 – 2016-11-01 (×4): 5 mg via ORAL
  Filled 2016-10-29 (×4): qty 1

## 2016-10-29 MED ORDER — METHYLPREDNISOLONE SODIUM SUCC 125 MG IJ SOLR
60.0000 mg | Freq: Two times a day (BID) | INTRAMUSCULAR | Status: DC
  Administered 2016-10-29 – 2016-10-30 (×3): 60 mg via INTRAVENOUS
  Filled 2016-10-29 (×3): qty 2

## 2016-10-29 MED ORDER — MAGNESIUM HYDROXIDE 400 MG/5ML PO SUSP
30.0000 mL | Freq: Every day | ORAL | Status: AC
  Administered 2016-10-30: 30 mL via ORAL
  Filled 2016-10-29: qty 30

## 2016-10-29 MED ORDER — LORAZEPAM 2 MG/ML IJ SOLN: 0.5000 mg | Freq: Two times a day (BID) | INTRAMUSCULAR | Status: DC | PRN

## 2016-10-29 NOTE — H&P (Signed)
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 Initial evaluation reveals acute on chronic respiratory failure and sepsis likely related to an early pneumonia in the setting of mild COPD exacerbation.  Information is obtained from the patient and his daughters who are at the bedside. Patient states yesterday he was in his usual state of health however he he did not have an appetite. Last evening he developed gradual worsening shortness of breath and increased cough. He states "I'm having a hard time coughing up the phlegm". He went to sleep without a problem but awakened 3-4 hours later with sudden worsening shortness of breath. He took his nebulizers with no relief. Associated symptoms symptoms include increased wheezing and increased cough. He also reports decreased appetite in general weakness. He denies headache dizziness syncope or near-syncope. He denies chest pain palpitations lower extremity edema orthopnea. He denies dysuria but does indicate he self caths due to ongoing urinary retention and enlarged prostate. He also endorses some constipation. He denies melena or bright red blood per rectum.   ED Course: In the emergency department temperature 100.9 orally blood pressure is quite soft he has tachypnea and increased oxygen demand. Workup reveals a mildly elevated lactic acid he is provided with IV fluids and antibiotics per  protocol  Review of Systems: As per HPI otherwise all other systems reviewed and are negative.   Ambulatory Status: He is mostly wheelchair-bound at home. He lives at home with his wife they have home care 3 days a week for 4 hours  Past Medical History:  Diagnosis Date  . Anemia, unspecified   . CAD (coronary artery disease)   . Cataracts, bilateral   . CHF (congestive heart failure) ([place])   . Chronic airway obstruction, not elsewhere classified    on o2 at night  . Diabetes mellitus   . Diverticulosis of colon (without mention of hemorrhage)   . Dyslipidemia   . Emphysema   . Hemorrhoids   . ICD (implantable cardiac defibrillator) in place    st jude  . Ischemic cardiomyopathy   . Macular degeneration   . MI, old 2010 or 2011  . Prostatic hypertrophy    hx of  . Retention of urine, unspecified   . Unspecified disorder resulting from impaired renal function     Past Surgical History:  Procedure Laterality Date  . CARDIAC DEFIBRILLATOR PLACEMENT  2011   st jude  . CATARACT EXTRACTION    . PACEMAKER INSERTION  2011  . pci     4/11  . TONSILLECTOMY  83 yo    Social History   Social History  . Marital status: Married    Spouse name: N/A  . Number of children: N/A  . 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 Smoking status: Former Smoker    Types: Cigarettes    Quit date: 04/27/2009  . Smokeless tobacco: Never Used  . Alcohol use Yes  Comment: occasional beer  . Drug use: No  . Sexual activity: No   Other Topics Concern  . Not on file   Social History Narrative   Lives in a house with stairs, which he needs to use, with his wife.  Wife is not able-bodied.  Does not use a cane or walker, but is unsteady on his feet.        [name] Pesce:  [phone] cell, [phone], daughter, POA.     Boyd Kerbs:  [phone], nephew   Juluis Pitch:  [phone], daughter             No  Known Allergies  Family History  Problem Relation Age of Onset  . Coronary artery disease Mother   . Stroke Mother   . Congestive Heart Failure Mother   . Lung cancer Father        smoker  . Breast cancer Sister   . Atrial fibrillation Daughter   . Heart attack Brother     Prior to Admission medications   Medication Sig Start Date End Date Taking? Authorizing Provider  albuterol (PROVENTIL) (2.5 MG/3ML) 0.083% nebulizer solution Take 2.5 mg by nebulization every 4 (four) hours as needed for wheezing or shortness of breath.   Yes [provider]  ALPRAZolam (XANAX) 0.25 MG tablet Take 0.25 mg by mouth at bedtime as needed for sleep.  02/13/15  Yes [provider]  arformoterol (BROVANA) 15 MCG/2ML NEBU Take 15 mcg by nebulization 2 (two) times daily.   Yes [provider]  aspirin EC 81 MG tablet Take 1 tablet (81 mg total) by mouth at bedtime. 11/27/13  Yes Dhungel, Nishant, MD  budesonide (PULMICORT) 0.5 MG/2ML nebulizer solution Take 2 mLs (0.5 mg total) by nebulization 2 (two) times daily. 08/31/12  Yes Parrett, Tammy S, NP  clopidogrel (PLAVIX) 75 MG tablet Take 1 tablet (75 mg total) by mouth daily. 07/07/16  Yes Josue Hector, MD  docusate sodium (COLACE) 100 MG capsule Take 100 mg by mouth 2 (two) times daily as needed for mild constipation.   Yes [provider]  guaiFENesin (MUCINEX) 600 MG 12 hr tablet Take 600 mg by mouth 2 (two) times daily as needed for cough or to loosen phlegm.    Yes [provider]  ibuprofen (ADVIL,MOTRIN) 200 MG tablet Take 200 mg by mouth every 6 (six) hours as needed for mild pain.   Yes [provider]  losartan (COZAAR) 25 MG tablet Take 1 tablet (25 mg total) by mouth daily. 04/16/16  Yes Josue Hector, MD  metoprolol tartrate (LOPRESSOR) 25 MG tablet Take 12.5 mg by mouth 2 (two) times daily. Reported on 06/06/2015   Yes [provider]  nitroGLYCERIN (NITROSTAT) 0.4 MG SL tablet Place 0.4  mg under the tongue every 5 (five) minutes as needed for chest pain. UP TO 3 DOSES THEN CALL EMS   Yes [provider]  OXYGEN Inhale 2 L into the lungs at bedtime.    Yes [provider]  rosuvastatin (CRESTOR) 5 MG tablet Take 1 tablet (5 mg total) by mouth daily at 6 PM. 08/12/16  Yes Josue Hector, MD  trimethoprim (TRIMPEX) 100 MG tablet Take 100 mg by mouth daily.   Yes [provider]    Physical Exam: Vitals:   10/29/16 0653 10/29/16 0757 10/29/16 0800 10/29/16 0900  BP:  110/67 (!) 108/48 (!) 90/45  Pulse:  85 81 92  Resp:  (!) 26 (!) 24 (!) 26  Temp:      TempSrc:      SpO2:  100% 100% 100%  Weight: 79.4 kg (175 lb)     Height: 6' (1.829 m)        General:  Appears Chronically ill somewhat pale uncomfortable but in no acute distress Eyes:  PERRL, EOMI, normal lids, iris ENT:  grossly normal hearing, lips & tongue, mucous membranes of his mouth are pink but dry Neck:  no LAD, masses or thyromegaly Cardiovascular:  RRR, no m/r/g. No LE edema. Pedal pulses present and palpable Respiratory:  Increased work of breathing with conversation. Breath sounds diminished throughout. A minimal expiratory wheezing I hear no crackles using accessory muscles Abdomen:  soft, ntnd, no guarding or rebounding Skin:  no rash or induration seen on limited exam Musculoskeletal:  grossly normal tone BUE/BLE, good ROM, no bony abnormality Psychiatric:  grossly normal mood and affect, speech fluent and appropriate, AOx3 Neurologic:  CN 2-12 grossly intact, moves all extremities in coordinated fashion, sensation intact speech clear facial symmetry, bilateral grip 5 out of 5  Labs on Admission: I have personally reviewed following labs and imaging studies  CBC:  Recent Labs Lab 10/29/16 0654  WBC 7.2  NEUTROABS 6.4  HGB 8.3*  HCT 25.2*  MCV 69.6*  PLT 59*   Basic Metabolic Panel:  Recent Labs Lab 10/29/16 0654  NA 136  K 4.7  CL 100*  CO2 23  GLUCOSE  106*  BUN 51*  CREATININE 2.10*  CALCIUM 8.7*   GFR: Estimated Creatinine Clearance: 26.2 mL/min (A) (by C-G formula based on SCr of 2.1 mg/dL (H)). Liver Function Tests:  Recent Labs Lab 10/29/16 0654  AST 22  ALT 12*  ALKPHOS 43  BILITOT 1.0  PROT 6.9  ALBUMIN 3.5   No results for input(s): LIPASE, AMYLASE in the last 168 hours. No results for input(s): AMMONIA in the last 168 hours. Coagulation Profile:  Recent Labs Lab 10/29/16 0654  INR 1.16   Cardiac Enzymes:  Recent Labs Lab 10/29/16 0706  TROPONINI <0.03   BNP (last 3 results) No results for input(s): PROBNP in the last 8760 hours. HbA1C: No results for input(s): HGBA1C in the last 72 hours. CBG: No results for input(s): GLUCAP in the last 168 hours. Lipid Profile: No results for input(s): CHOL, HDL, LDLCALC, TRIG, CHOLHDL, LDLDIRECT in the last 72 hours. Thyroid Function Tests: No results for input(s): TSH, T4TOTAL, FREET4, T3FREE, THYROIDAB in the last 72 hours. Anemia Panel: No results for input(s): VITAMINB12, FOLATE, FERRITIN, TIBC, IRON, RETICCTPCT in the last 72 hours. Urine analysis:    Component Value Date/Time   COLORURINE YELLOW 06/11/2015 0515   APPEARANCEUR CLOUDY (A) 06/11/2015 0515   LABSPEC 1.023 06/11/2015 0515   LABSPEC 1.025 05/01/2010 1454   PHURINE 5.0 06/11/2015 0515   GLUCOSEU NEGATIVE 06/11/2015 0515   HGBUR LARGE (A) 06/11/2015 0515   BILIRUBINUR NEGATIVE 06/11/2015 0515   BILIRUBINUR Negative 05/01/2010 1454   KETONESUR NEGATIVE 06/11/2015 0515   PROTEINUR NEGATIVE 06/11/2015 0515   UROBILINOGEN 0.2 06/07/2014 2018   NITRITE POSITIVE (A) 06/11/2015 0515   LEUKOCYTESUR MODERATE (A) 06/11/2015 0515   LEUKOCYTESUR Negative 05/01/2010 1454    Creatinine Clearance: Estimated Creatinine Clearance: 26.2 mL/min (A) (by C-G formula based on SCr of 2.1 mg/dL (H)).  Sepsis Labs: @LABRCNTIP (procalcitonin:4,lacticidven:4) )No results found for this or any previous visit  (from the past 240 hour(s)).   Radiological Exams on Admission: Dg Chest Portable 1 View  Result Date: 10/29/2016 CLINICAL DATA:  Shortness of breath with wheezing.  Cough. EXAM: PORTABLE CHEST 1 VIEW COMPARISON:  Jun 14, 2015 FINDINGS: Lungs are mildly hyperexpanded. There is ill-defined opacity in the left mid lung region, suspicious for a developing infiltrate/pneumonia. Lungs elsewhere are clear. Heart size and pulmonary vascularity are normal. Pacemaker leads are attached to the right atrium and right ventricle. There is aortic atherosclerosis. No adenopathy. There is degenerative change in the shoulders. There is superior migration of the right humeral head. IMPRESSION: Rather subtle ill-defined opacity in the left mid lung region. Suspect developing pneumonia. Lungs elsewhere clear. Heart size within normal limits. There is aortic atherosclerosis. Pacemaker leads attached to right atrium and right ventricle. Chronic rotator cuff tear on the right with superior migration of the right humeral head. Aortic Atherosclerosis (ICD10-I70.0). Followup PA and lateral chest radiographs recommended in 3-4 weeks following trial of antibiotic therapy to ensure resolution and exclude underlying malignancy. Electronically Signed   By: Lowella Grip III M.D.   On: 10/29/2016 07:37    EKG: Independently reviewed. Sinus rhythm Atrial premature complex Anteroseptal infarct, age indeterminate No significant change since last tracing  Assessment/Plan Principal Problem:   Acute on chronic respiratory failure (HCC) Active Problems:   Anemia   Coronary atherosclerosis   URINARY RETENTION   Dyspnea   CHF (congestive heart failure) (HCC)   Ischemic cardiomyopathy   Automatic implantable cardioverter-defibrillator in situ   MGUS (monoclonal gammopathy of unknown significance)   COPD with exacerbation (HCC)   Sepsis ([place])   Dysphagia   Acute renal failure superimposed on chronic kidney disease ([place])   CAP  (community acquired pneumonia)   #1. Acute on chronic respiratory failure likely related to early community-acquired pneumonia in the setting of a mild CHF exacerbation. Chest x-ray concerning for early pneumonia. Patient with increased oxygen demand. BNP within the limits of normal. Initial troponin negative. He received breathing treatment from EMS and reports improved -Admit to telemetry -Scheduled nebulizers -Antibiotics per pneumonia protocol -Sputum culture as able -anti-tussive -flutter valve -Wean oxygen as able -Monitor oxygen saturation level  #2. Community-acquired pneumonia. Chest x-ray as noted above. -Antibiotics per protocol -Sputum culture as able - strep Pneumo urine antigen -Antitussive -Nebulizers -See #1  #3. Sepsis. Likely related to above. max temperature 100.9, lactic acid mildly elevated tachypnea, increased oxygen demand. Worsening kidney function. Chest x-ray concerning for pneumonia. He received IV fluids per protocol in the emergency department. Rocephin and azithromycin also given as well as Tylenol -See #2 -Continue gentle IV fluids -Continue antibiotics -Follow blood cultures -Obtain urinalysis  #4. Acute renal failure superimposed on chronic kidney disease stage III. Likely related to decreased oral intake in the setting of above. Addition patient suffers urinary retention and engages in self cath. Creatinine on admission 2.10. Appears his baseline 1.2. -Gentle IV fluids -Hold nephrotoxins -Monitor intake and output -Continue with in and out caths as needed -Recheck in the morning  #5. COPD exacerbation. Mild. Chest x-ray as noted above. Chart review indicates patient recently seen by pulmonology.PFT's 04/16/11 FEV1 1.57 (60%) ratio 71 and 12% better p B2, dlco 41 corrects to 58.  It was noted he was at his baseline 2 weeks ago.  -Continue home meds -Scheduled nebs as noted -no steroids at this poin  #6. CAD/cardiomyopathy/ chronic systolic heart  failure status post stents. No chest pain. Initial troponin negative. EKG as noted above. Of note patient survived a prolonged out of hospital cardiac arrest with ventricular fibrillation in 2011 status post AICD. Last echo 2015 EF  of 58% grade 1 diastolic dysfunction. Home medications include Plavix, losartan, metoprolol, Crestor -Cycle troponin -Daily weights -Monitor intake and output -Continue Plavix and Crestor -Hold losartan metoprolol for now -Monitor  #7. Anemia. Likley related to chronic disease. Hg currently at baseline. No obvious signs symptoms of bleeding -Anemia panel -Monitor  #8. Dysphasia. Patient had swallow eval last year revealing Presbyesophagus. He denies special diet -Monitor    DVT prophylaxis: plavix  Code Status: full  Family Communication: daughters at bedside  Disposition Plan: home  Consults called: none  Admission status: inpatient    Radene Gunning MD [facility]  If 7PM-7AM, please contact night-coverage [email or web address] Password [email or web address]  10/29/2016, 10:32 AM

## 2016-10-29 NOTE — Progress Notes (Signed)
Pt arrived to 3e15, pt alert and oriented and working hard to breath.  Pt's oxygen sats ranging from 55-98% with HR in the 90's.  Family stating that pt is worse now than he was in the ED.  Rapid response, MD, and respiratory called.  At the bedside with pt, new orders placed for bipap, lasix, solumedrol, and foley catheter.  New orders carried out.  Currently waiting on bed in stepdown unit.  Will continue to monitor.

## 2016-10-29 NOTE — Telephone Encounter (Signed)
New message    Pt daughter is calling to let Dr. Johnsie Cancel know that pt is in the hospital.

## 2016-10-29 NOTE — Progress Notes (Signed)
Pt transported from [place] to 4E23 on nasal cannula by Rapid Response RN. Pt placed back on Bipap when brought to 4E.  Pt tolerating well at this time.

## 2016-10-29 NOTE — Progress Notes (Signed)
Notified by floor RN of patient's worsening sob/cough. Labile oxygen saturation levels  PE: Gen: in bed anxious with increased work of breathing CV: rrr no m/g/r no LE edema Resp: moderate to severe increased work of breathing with conversation. Audible wheezing. Decreased air movement. Diffuse wheeze throughout  Abd: soft +BS non-tender   Assessment/plan ABG with ph 7.39 Co2 37 O2 66. Chest xray stable. Concern for fluid overload in setting of cap and worsening copd exacerbation -solumedrol -lasix 20mg  IV once -bipap -continue nebs and antibiotics.  -transfer to [place]. -wean bipap as able    Santiago Glad m. Erion Hermans NP

## 2016-10-29 NOTE — Significant Event (Signed)
Rapid Response Event Note  Overview: Time Called: 1228 Arrival Time: 1233 Event Type: Respiratory  Initial Focused Assessment: Per staff patient with respiratory distress on arrival to unit. Upon my arrival patient in acute respiratory distress, increased work of breathing.  Audible breathing.  Lung sounds tight. Heart tones regular  BP 136/53  SR 90  RR 38  O2 sats 95% on [place] 5 L  Oral temp 98.9  Family at bedside  Interventions: Respiratory treatments given Dyanne Carrel and Dr Marily Memos at bedside Lasix and Solumedrol given IV Verbal order for IVF at 10cc/hr  ABG done Placed patient on Bipap 15/5  40%  O2 sats 99-100%  Rectal temp 103.3  Foley placed Tylenol given   Patient resting on bipap BP 87/44  SR 89  RR 28  O2 sats 100% Dr Marily Memos notified of BP, order received to return NS to a rate of 50cc/hr  1440  Patient transported to 4E23 via bed RN at bedside  Plan of Care (if not transferred):  Event Summary: Name of Physician Notified: Merrell at 1230    at    Outcome: Transferred (Comment)     Raliegh Ip

## 2016-10-29 NOTE — Progress Notes (Signed)
CRITICAL VALUE ALERT  Critical Value:  Lactic acid 2.3  Date & Time Notied:  10/29/16 at 1543  Provider Notified: Marily Memos  Orders Received/Actions taken: no new orders received

## 2016-10-29 NOTE — Progress Notes (Signed)
Pt arrived to 4e from 3e. Placed on bi-pap. RT and rapid RN at bedside. Vitals obtained. Pt oriented to room and staff.   Grant Fontana BSN, RN

## 2016-10-29 NOTE — ED Provider Notes (Signed)
[facility] DEPT Provider Note   CSN: [id number] Arrival date & time: 10/29/16  8676     History   Chief Complaint Chief Complaint  Patient presents with  . 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  83 year old male with history of COPD, CHF presents with one-day history of cough and shortness of breath. Cough productive of green sputum. Denies any anginal type chest pain. Has used home inhalers without relief. Recently saw his doctor for checkup 5 days ago when that visit was reviewed and he was at his baseline. No vomiting or diarrhea. Called EMS and was given albuterol and transferred here for further management.      Past Medical History:  Diagnosis Date  . Anemia, unspecified   . CAD (coronary artery disease)   . Cataracts, bilateral   . CHF (congestive heart failure) ([place])   . Chronic airway obstruction, not elsewhere classified    on o2 at night  . Diabetes mellitus   . Diverticulosis of colon (without mention of hemorrhage)   . Dyslipidemia   . Emphysema   . Hemorrhoids   . ICD (implantable cardiac defibrillator) in place    [facility] jude  . Ischemic cardiomyopathy   . Macular degeneration   . MI, old 2010 or 2011  . Prostatic hypertrophy    hx of  . Retention of urine, unspecified   . Unspecified disorder resulting from impaired renal function     Patient Active Problem List   Diagnosis Date Noted  . Chronic respiratory failure with hypoxia ([place]) 10/24/2016  . Presbyesophagus   . Dysphagia   . Cardiomyopathy, ischemic   . COPD (chronic obstructive pulmonary disease) ([place]) 11/21/2013  . Diverticulitis of colon with perforation 11/21/2013  . Severe sepsis ([place]) 11/21/2013  . Adynamic ileus ([place]) 11/21/2013  . UTI (urinary tract infection), bacterial 11/21/2013  . Severe protein-calorie malnutrition ([place]) 11/21/2013  . Hypernatremia 11/17/2013  . Preop pulmonary/respiratory exam 11/15/2013  . Preoperative respiratory  examination 11/14/2013  . Acute renal failure ([place]) 07/01/2013  . Low back pain 07/01/2013  . UTI (urinary tract infection) 07/01/2013  . Dysphagia, unspecified(787.20) 10/27/2012  . Acute on chronic renal insufficiency 03/12/2012  . MGUS (monoclonal gammopathy of unknown significance) 03/12/2012  . Thrombocytopenia ([place]) 03/12/2012  . Beta thalassemia trait 03/12/2012  . UTI (lower urinary tract infection) 03/12/2012  . Gait instability 03/12/2012  . Fall at home 03/12/2012  . Automatic implantable cardioverter-defibrillator in situ 12/09/2011  . Ischemic cardiomyopathy 04/14/2011  . Cyanocobalamin deficiency 12/19/2010  . CHF (congestive heart failure) ([place]) 10/09/2010  . Edema 07/08/2010  . Dyspnea 07/08/2010  . CARDIAC ARREST 04/04/2010  . CONDUCTIVE HEARING LOSS OF COMBINED TYPES 10/24/2009  . KNEE PAIN 08/07/2009  . MI 06/07/2009  . DIABETES MELLITUS, TYPE II 06/06/2009  . DYSLIPIDEMIA 06/06/2009  . Anemia of chronic disease 06/06/2009  . CAD 06/06/2009  . Asthma vs vcd 06/06/2009  . DIVERTICULAR DISEASE 06/06/2009  . Chronic kidney disease, stage 3 ([place]) 06/06/2009  . URINARY RETENTION 06/06/2009  . BENIGN PROSTATIC HYPERTROPHY, HX OF 06/06/2009    Past Surgical History:  Procedure Laterality Date  . CARDIAC DEFIBRILLATOR PLACEMENT  2011   [facility] jude  . CATARACT EXTRACTION    . PACEMAKER INSERTION  2011  . pci     4/11  . TONSILLECTOMY  83 yo       Home Medications    Prior to Admission medications   Medication Sig Start Date End  Date Taking? Authorizing Provider  albuterol (PROVENTIL) (2.5 MG/3ML) 0.083% nebulizer solution Take 2.5 mg by nebulization every 4 (four) hours as needed for wheezing or shortness of breath.    [provider]  ALPRAZolam Duanne Moron) 0.25 MG tablet Take 0.25 mg by mouth at bedtime.  02/13/15   [provider]  arformoterol (BROVANA) 15 MCG/2ML NEBU Take 15 mcg by nebulization 2 (two) times daily.    [provider]  aspirin EC 81 MG tablet Take 1 tablet (81 mg total) by mouth at bedtime. 11/27/13   Dhungel, Nishant, MD  budesonide (PULMICORT) 0.5 MG/2ML nebulizer solution Take 2 mLs (0.5 mg total) by nebulization 2 (two) times daily. 08/31/12   Parrett, Fonnie Mu, NP  clopidogrel (PLAVIX) 75 MG tablet Take 1 tablet (75 mg total) by mouth daily. 07/07/16   Josue Hector, MD  guaiFENesin (MUCINEX) 600 MG 12 hr tablet Take 600 mg by mouth 2 (two) times daily.    [provider]  ibuprofen (ADVIL,MOTRIN) 200 MG tablet Take 200 mg by mouth every 6 (six) hours as needed for mild pain.    [provider]  losartan (COZAAR) 25 MG tablet Take 1 tablet (25 mg total) by mouth daily. 04/16/16   Josue Hector, MD  metoprolol tartrate (LOPRESSOR) 25 MG tablet Take 12.5 mg by mouth 2 (two) times daily. Reported on 06/06/2015    [provider]  nitroGLYCERIN (NITROSTAT) 0.4 MG SL tablet Place 0.4 mg under the tongue every 5 (five) minutes as needed for chest pain. UP TO 3 DOSES THEN CALL EMS    [provider]  OXYGEN Inhale into the lungs at bedtime.    [provider]  rosuvastatin (CRESTOR) 5 MG tablet Take 1 tablet (5 mg total) by mouth daily at 6 PM. 08/12/16   Josue Hector, MD  trimethoprim (TRIMPEX) 100 MG tablet Take 100 mg by mouth daily.    [provider]    Family History Family History  Problem Relation Age of Onset  . Coronary artery disease Mother   . Stroke Mother   . Congestive Heart Failure Mother   . Lung cancer Father        smoker  . Breast cancer Sister   . Atrial fibrillation Daughter   . Heart attack Brother     Social History Social History  Substance Use Topics  . Smoking status: Former Smoker    Types: Cigarettes    Quit date: 04/27/2009  . Smokeless tobacco: Never Used  . Alcohol use Yes     Comment: occasional beer     Allergies   Patient has no known allergies.   Review of Systems Review of Systems  All other  systems reviewed and are negative.    Physical Exam Updated Vital Signs BP (!) 127/52 (BP Location: Right Arm)   Pulse 93   Temp (!) 100.9 F (38.3 C) (Oral)   Resp (!) 22   Ht 1.829 m (6')   Wt 79.4 kg (175 lb)   SpO2 96%   BMI 23.73 kg/m   Physical Exam  Constitutional: He is oriented to person, place, and time. He appears well-developed and well-nourished.  Non-toxic appearance. No distress.  HENT:  Head: Normocephalic and atraumatic.  Eyes: Pupils are equal, round, and reactive to light. Conjunctivae, EOM and lids are normal.  Neck: Normal range of motion. Neck supple. No tracheal deviation present. No thyroid mass present.  Cardiovascular: Normal rate, regular rhythm and normal heart  sounds.  Exam reveals no gallop.   No murmur heard. Pulmonary/Chest: No stridor. Tachypnea noted. He has decreased breath sounds in the right lower field and the left lower field. He has wheezes in the right lower field and the left lower field. He has no rhonchi. He has no rales.  Abdominal: Soft. Normal appearance and bowel sounds are normal. He exhibits no distension. There is no tenderness. There is no rebound and no CVA tenderness.  Musculoskeletal: Normal range of motion. He exhibits no edema or tenderness.  Neurological: He is alert and oriented to person, place, and time. He has normal strength. No cranial nerve deficit or sensory deficit. GCS eye subscore is 4. GCS verbal subscore is 5. GCS motor subscore is 6.  Skin: Skin is warm and dry. No abrasion and no rash noted.  Psychiatric: He has a normal mood and affect. His speech is normal and behavior is normal.  Nursing note and vitals reviewed.    ED Treatments / Results  Labs (all labs ordered are listed, but only abnormal results are displayed) Labs Reviewed  CULTURE, BLOOD (ROUTINE X 2)  CULTURE, BLOOD (ROUTINE X 2)  COMPREHENSIVE METABOLIC PANEL  CBC WITH DIFFERENTIAL/PLATELET  PROTIME-INR  URINALYSIS, ROUTINE W REFLEX  MICROSCOPIC  BRAIN NATRIURETIC PEPTIDE  TROPONIN I  I-STAT CG4 LACTIC ACID, ED    EKG  EKG Interpretation  Date/Time:  Wednesday October 29 2016 06:53:48 EDT Ventricular Rate:  91 PR Interval:    QRS Duration: 98 QT Interval:  348 QTC Calculation: 429 R Axis:   88 Text Interpretation:  Sinus rhythm Atrial premature complex Anteroseptal infarct, age indeterminate No significant change since last tracing Confirmed by Lacretia Leigh ([id number]) on 10/29/2016 7:14:21 AM       Radiology No results found.  Procedures Procedures (including critical care time)  Medications Ordered in ED Medications  acetaminophen (TYLENOL) tablet 650 mg (not administered)  albuterol (PROVENTIL) (2.5 MG/3ML) 0.083% nebulizer solution 5 mg (not administered)     Initial Impression / Assessment and Plan / ED Course  I have reviewed the triage vital signs and the nursing notes.  Pertinent labs & imaging results that were available during my care of the patient were reviewed by me and considered in my medical decision making (see chart for details).   patient started on IV antibiotics for her to be required pneumonia. Will be admitted to the hospitalist service  Final Clinical Impressions(s) / ED Diagnoses   Final diagnoses:  None    New Prescriptions New Prescriptions   No medications on file     Lacretia Leigh, MD 10/29/16 1449

## 2016-10-29 NOTE — ED Notes (Signed)
Pt woke up 3-4 hrs ago with sudden onset SOB, pt attempted to use his neb tx at home with no relief. EMS gave 5 albuterol, 1 atrovent. Pt has labored breathing and exp wheezing noted. Pt also reports productive cough.

## 2016-10-29 NOTE — Progress Notes (Signed)
CRITICAL VALUE ALERT  Critical Value:  Troponin 0.05  Date & Time Notied:  10/29/16 at 1619  Provider Notified: Marily Memos  Orders Received/Actions taken: no new orders received

## 2016-10-29 NOTE — ED Notes (Signed)
Dr Allen at bedside  

## 2016-10-30 DIAGNOSIS — Z95810 Presence of automatic (implantable) cardiac defibrillator: Secondary | ICD-10-CM

## 2016-10-30 DIAGNOSIS — J441 Chronic obstructive pulmonary disease with (acute) exacerbation: Secondary | ICD-10-CM

## 2016-10-30 DIAGNOSIS — I5020 Unspecified systolic (congestive) heart failure: Secondary | ICD-10-CM

## 2016-10-30 DIAGNOSIS — J189 Pneumonia, unspecified organism: Secondary | ICD-10-CM

## 2016-10-30 DIAGNOSIS — N179 Acute kidney failure, unspecified: Secondary | ICD-10-CM

## 2016-10-30 DIAGNOSIS — N184 Chronic kidney disease, stage 4 (severe): Secondary | ICD-10-CM

## 2016-10-30 DIAGNOSIS — J9621 Acute and chronic respiratory failure with hypoxia: Secondary | ICD-10-CM

## 2016-10-30 LAB — CBC
HEMATOCRIT: 21.2 % — AB (ref 39.0–52.0)
HEMOGLOBIN: 6.8 g/dL — AB (ref 13.0–17.0)
MCH: 22.3 pg — AB (ref 26.0–34.0)
MCHC: 32.1 g/dL (ref 30.0–36.0)
MCV: 69.5 fL — ABNORMAL LOW (ref 78.0–100.0)
Platelets: 55 10*3/uL — ABNORMAL LOW (ref 150–400)
RBC: 3.05 MIL/uL — ABNORMAL LOW (ref 4.22–5.81)
RDW: 18.7 % — ABNORMAL HIGH (ref 11.5–15.5)
WBC: 5.8 10*3/uL (ref 4.0–10.5)

## 2016-10-30 LAB — RESPIRATORY PANEL BY PCR
Adenovirus: NOT DETECTED
Bordetella pertussis: NOT DETECTED
CORONAVIRUS 229E-RVPPCR: NOT DETECTED
Chlamydophila pneumoniae: NOT DETECTED
Coronavirus HKU1: NOT DETECTED
Coronavirus NL63: NOT DETECTED
Coronavirus OC43: NOT DETECTED
INFLUENZA B-RVPPCR: NOT DETECTED
Influenza A: NOT DETECTED
MYCOPLASMA PNEUMONIAE-RVPPCR: NOT DETECTED
Metapneumovirus: NOT DETECTED
Parainfluenza Virus 1: NOT DETECTED
Parainfluenza Virus 2: NOT DETECTED
Parainfluenza Virus 3: NOT DETECTED
Parainfluenza Virus 4: NOT DETECTED
RESPIRATORY SYNCYTIAL VIRUS-RVPPCR: NOT DETECTED
Rhinovirus / Enterovirus: NOT DETECTED

## 2016-10-30 LAB — COMPREHENSIVE METABOLIC PANEL
ALBUMIN: 2.9 g/dL — AB (ref 3.5–5.0)
ALK PHOS: 35 U/L — AB (ref 38–126)
ALT: 13 U/L — ABNORMAL LOW (ref 17–63)
ANION GAP: 11 (ref 5–15)
AST: 21 U/L (ref 15–41)
BUN: 58 mg/dL — ABNORMAL HIGH (ref 6–20)
CALCIUM: 8.2 mg/dL — AB (ref 8.9–10.3)
CO2: 23 mmol/L (ref 22–32)
Chloride: 103 mmol/L (ref 101–111)
Creatinine, Ser: 2.33 mg/dL — ABNORMAL HIGH (ref 0.61–1.24)
GFR calc Af Amer: 27 mL/min — ABNORMAL LOW (ref >60)
GFR calc non Af Amer: 23 mL/min — ABNORMAL LOW (ref >60)
GLUCOSE: 228 mg/dL — AB (ref 65–99)
Potassium: 4.2 mmol/L (ref 3.5–5.1)
SODIUM: 137 mmol/L (ref 135–145)
Total Bilirubin: 0.7 mg/dL (ref 0.3–1.2)
Total Protein: 6.2 g/dL — ABNORMAL LOW (ref 6.5–8.1)

## 2016-10-30 LAB — PREPARE RBC (CROSSMATCH)

## 2016-10-30 MED ORDER — METOPROLOL TARTRATE 12.5 MG HALF TABLET
12.5000 mg | ORAL_TABLET | Freq: Two times a day (BID) | ORAL | Status: DC
  Administered 2016-10-30 – 2016-11-01 (×6): 12.5 mg via ORAL
  Filled 2016-10-30 (×7): qty 1

## 2016-10-30 MED ORDER — SODIUM CHLORIDE 0.9 % IV SOLN: Freq: Once | INTRAVENOUS | Status: DC

## 2016-10-30 MED ORDER — IPRATROPIUM-ALBUTEROL 0.5-2.5 (3) MG/3ML IN SOLN
3.0000 mL | Freq: Four times a day (QID) | RESPIRATORY_TRACT | Status: DC
  Administered 2016-10-30 – 2016-11-02 (×11): 3 mL via RESPIRATORY_TRACT
  Filled 2016-10-30 (×10): qty 3

## 2016-10-30 MED ORDER — ALBUTEROL SULFATE (2.5 MG/3ML) 0.083% IN NEBU
2.5000 mg | INHALATION_SOLUTION | RESPIRATORY_TRACT | Status: DC | PRN
  Administered 2016-10-30: 2.5 mg via RESPIRATORY_TRACT

## 2016-10-30 MED ORDER — SODIUM CHLORIDE 0.9 % IV SOLN
INTRAVENOUS | Status: DC
  Administered 2016-10-30: 13:00:00 via INTRAVENOUS

## 2016-10-30 NOTE — Care Management Note (Signed)
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  Action/Plan: PTA pt lived at home, wears home 02 2.5 L baseline at night- CM to follow for d/c needs  Expected Discharge Date:                  Expected Discharge Plan:     In-House Referral:     Discharge planning Services  CM Consult  Post Acute Care Choice:    Choice offered to:     DME Arranged:    DME Agency:     HH Arranged:    [facility] Agency:     Status of Service:  In process, will continue to follow  If discussed at Long Length of Stay Meetings, dates discussed:    Discharge Disposition:   Additional Comments:  Dawayne Patricia, RN 10/30/2016, 11:45 AM

## 2016-10-30 NOTE — Progress Notes (Signed)
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 Initial evaluation reveals acute on chronic respiratory failure and sepsis likely related to an early pneumonia in the setting of mild COPD exacerbation.   Assessment & Plan:   Principal Problem:   Acute on chronic respiratory failure (HCC) - due to CAP/COPD exacerbation -improving, off BIPAP -continue Abx/IV Solumedrol/nebs -wean O2 down to 2.5L -check Resp virus panel  Microcytic anemia -acute on chronic, Hb down to Anemia -anemia panel with severe Iron defi, no overt bleeding -will transfuse and start Iron at DC -conservative mgt due to age/co-morbidities   Sepsis -clinically improving -lactate normalized -continue Abx, FU Blood Cx   AKI on CKD3 -due to sepsis, dehydration, etc -now with foley, he self catheterizes 6x a day -gentle IVF today   COPD exacerbation. -as above, continue ABx, nebs and solumedrol -on 2.5L Home O2   CAD/cardiomyopathy/ chronic systolic heart failure  -Of note patient survived a prolonged out of hospital cardiac arrest with ventricular fibrillation in 2011 status post AICD. Last echo 2015 EF of 65% grade 1 diastolic dysfunction -Continue Plavix and Crestor -resume metoprolol -stable   DVT prophylaxis: SCDs for now, on ASA and plavix with very low Hb of 6.8 Code Status: full  Family Communication: daughter at bedside  Disposition Plan: home when improved  Consultants:       Procedures:   Antimicrobials:  ROcephin/zithromax  Subjective: Breathing improving, coughing some  Objective: Vitals:   10/30/16 0454 10/30/16 0800 10/30/16 0830 10/30/16 0935  BP:  (!) 100/49    Pulse:  75    Resp:  (!) 27    Temp:    97.7 F (36.5 C)  TempSrc:    Oral  SpO2: 100% 99% 99%   Weight:      Height:        Intake/Output Summary (Last 24 hours) at 10/30/16 1116 Last data filed at 10/29/16 1500  Gross per 24 hour  Intake            61.67 ml  Output                0 ml  Net            61.67 ml   Filed Weights   10/29/16 0653 10/29/16 1213 10/30/16 0400  Weight: 79.4 kg (175 lb) 72.5 kg (159 lb 14.4 oz) 74.7 kg (164 lb 9.6 oz)    Examination:  General exam: Appears calm and comfortable, no distress, AAOx3 Respiratory system: few scattered ronchi, no wheezes Cardiovascular system: S1 & S2 heard, RRR. No JVD, murmurs, rubs, gallops Gastrointestinal system: Abdomen is nondistended, soft and nontender.Normal bowel sounds heard. Central nervous system: Alert and oriented. No focal neurological deficits. Extremities: Symmetric 5 x 5 power. Skin: No rashes, lesions or ulcers Psychiatry: Judgement and insight appear normal. Mood & affect appropriate.     Data Reviewed:   CBC:  Recent Labs Lab 10/29/16 0654 10/30/16 0307  WBC 7.2 5.8  NEUTROABS 6.4  --   HGB 8.3* 6.8*  HCT 25.2* 21.2*  MCV  69.6* 69.5*  PLT 59* 55*   Basic Metabolic Panel:  Recent Labs Lab 10/29/16 0654 10/30/16 0307  NA 136 137  K 4.7 4.2  CL 100* 103  CO2 23 23  GLUCOSE 106* 228*  BUN 51* 58*  CREATININE 2.10* 2.33*  CALCIUM 8.7* 8.2*   GFR: Estimated Creatinine Clearance: 22.7 mL/min (A) (by C-G formula based on SCr of 2.33 mg/dL (H)). Liver Function Tests:  Recent Labs Lab 10/29/16 0654 10/30/16 0307  AST 22 21  ALT 12* 13*  ALKPHOS 43 35*  BILITOT 1.0 0.7  PROT 6.9 6.2*  ALBUMIN 3.5 2.9*   No results for input(s): LIPASE, AMYLASE in the last 168  hours. No results for input(s): AMMONIA in the last 168 hours. Coagulation Profile:  Recent Labs Lab 10/29/16 0654  INR 1.16   Cardiac Enzymes:  Recent Labs Lab 10/29/16 0706 10/29/16 1422 10/29/16 1921 10/29/16 2247  TROPONINI <0.03 0.05* 0.05* 0.04*   BNP (last 3 results) No results for input(s): PROBNP in the last 8760 hours. HbA1C: No results for input(s): HGBA1C in the last 72 hours. CBG:  Recent Labs Lab 10/29/16 1627  GLUCAP 155*   Lipid Profile: No results for input(s): CHOL, HDL, LDLCALC, TRIG, CHOLHDL, LDLDIRECT in the last 72 hours. Thyroid Function Tests: No results for input(s): TSH, T4TOTAL, FREET4, T3FREE, THYROIDAB in the last 72 hours. Anemia Panel:  Recent Labs  10/29/16 1422  VITAMINB12 74*  FOLATE 14.6  FERRITIN 329  TIBC 216*  IRON 6*  RETICCTPCT 0.9   Urine analysis:    Component Value Date/Time   COLORURINE AMBER (A) 10/29/2016 1322   APPEARANCEUR CLOUDY (A) 10/29/2016 1322   LABSPEC 1.015 10/29/2016 1322   LABSPEC 1.025 05/01/2010 1454   PHURINE 5.0 10/29/2016 1322   GLUCOSEU NEGATIVE 10/29/2016 1322   HGBUR LARGE (A) 10/29/2016 1322   BILIRUBINUR NEGATIVE 10/29/2016 1322   BILIRUBINUR Negative 05/01/2010 1454   KETONESUR NEGATIVE 10/29/2016 1322   PROTEINUR 30 (A) 10/29/2016 1322   UROBILINOGEN 0.2 06/07/2014 2018   NITRITE NEGATIVE 10/29/2016 1322   LEUKOCYTESUR TRACE (A) 10/29/2016 1322   LEUKOCYTESUR Negative 05/01/2010 1454   Sepsis Labs: @LABRCNTIP (procalcitonin:4,lacticidven:4)  )No results found for this or any previous visit (from the past 240 hour(s)).       Radiology Studies: Dg Chest Port 1 View  Result Date: 10/29/2016 CLINICAL DATA:  Shortness of Breath EXAM: PORTABLE CHEST 1 VIEW COMPARISON:  Study obtained earlier in the day FINDINGS: Ill-defined opacity remains on the left, indicative of a degree of pneumonia. Lungs elsewhere are clear. The heart size and pulmonary vascularity are normal. There is  aortic atherosclerosis. Pacemaker leads are attached the right atrium and right ventricle. There is degenerative change in each shoulder. There is superior migration of each humeral head. IMPRESSION: Pneumonia in left upper lobe, slightly better defined than earlier in the day. Lungs elsewhere clear. Stable cardiac silhouette. There is aortic atherosclerosis. There are chronic rotator cuff tears bilaterally, characterized by elevation in each humeral head. Aortic Atherosclerosis (ICD10-I70.0). Followup PA and lateral chest radiographs recommended in 3-4 weeks following trial of antibiotic therapy to ensure resolution and exclude underlying malignancy. Electronically Signed   By: Lowella Grip III M.D.   On: 10/29/2016 13:29   Dg Chest Portable 1 View  Result Date: 10/29/2016 CLINICAL DATA:  Shortness of breath with wheezing.  Cough. EXAM: PORTABLE CHEST 1 VIEW COMPARISON:  Jun 14, 2015 FINDINGS: Lungs are mildly hyperexpanded. There is ill-defined opacity in the left mid lung  region, suspicious for a developing infiltrate/pneumonia. Lungs elsewhere are clear. Heart size and pulmonary vascularity are normal. Pacemaker leads are attached to the right atrium and right ventricle. There is aortic atherosclerosis. No adenopathy. There is degenerative change in the shoulders. There is superior migration of the right humeral head. IMPRESSION: Rather subtle ill-defined opacity in the left mid lung region. Suspect developing pneumonia. Lungs elsewhere clear. Heart size within normal limits. There is aortic atherosclerosis. Pacemaker leads attached to right atrium and right ventricle. Chronic rotator cuff tear on the right with superior migration of the right humeral head. Aortic Atherosclerosis (ICD10-I70.0). Followup PA and lateral chest radiographs recommended in 3-4 weeks following trial of antibiotic therapy to ensure resolution and exclude underlying malignancy. Electronically Signed   By: Lowella Grip III  M.D.   On: 10/29/2016 07:37        Scheduled Meds: . arformoterol  15 mcg Nebulization BID  . aspirin EC  81 mg Oral QHS  . budesonide  0.5 mg Nebulization BID  . chlorhexidine  15 mL Mouth Rinse BID  . clopidogrel  75 mg Oral Daily  . docusate sodium  100 mg Oral BID  . magnesium hydroxide  30 mL Oral Daily  . mouth rinse  15 mL Mouth Rinse q12n4p  . methylPREDNISolone (SOLU-MEDROL) injection  60 mg Intravenous Q12H  . rosuvastatin  5 mg Oral q1800   Continuous Infusions: . sodium chloride    . azithromycin 500 mg (10/30/16 1036)  . cefTRIAXone (ROCEPHIN)  IV Stopped (10/30/16 1001)     LOS: 1 day    Time spent: 42min    Domenic Polite, MD [facility] Page via [email or web address], password [email or web address] After 7PM please contact night-coverage  10/30/2016, 11:16 AM

## 2016-10-30 NOTE — Progress Notes (Signed)
RT came into pt room to give treatment and pt was off Bipap. Pt's vitals signs are currently stable pt isn't having any increase WOB/SOB. RT will continue to monitor.

## 2016-10-30 NOTE — Progress Notes (Signed)
Patient is tolerating 3L nasal cannula well at this time. States that he wishes to only wear his oxygen tonight. RT informed him to let us know if he changes his mind at any time. RT will continue to monitor.

## 2016-10-30 NOTE — Progress Notes (Addendum)
Follow up round:  2115-2215 Patient was BIPAP, tolerating very well, not in acute distress.  Patient would awaken to voice, follows commands, not in respiratory distress, did state that he would like to eat something if possible. RR 18-22, good air movement.   Patient was taken off BIPAP at 2155, placed on 4L nasal cannula.  Tolerated well, no respiratory distress, patient is able to suction his mouth and was able to eat as well.   Call RRT if needed  At 0200 followup: Patient was placed back on BIPAP at 0045. While I was on the unit, patient requested that his mouth be swabbed and he was experiencing dry mouth, patient was taken off BIPAP and mouth care was done, patient requested that BIPAP be left off, patient was placed on 2L oxygen. Lung sounds dimiminished in the lower fields but good air movement, minimal wheezing at times. No acute respiratory distress, RN updated.

## 2016-10-30 NOTE — Progress Notes (Signed)
Placed patient on BIPAP due to moderate increased work of breathing. Patient on 3lpm with Sp02-99% with respiratory rate 25-29.

## 2016-10-30 NOTE — Evaluation (Signed)
Physical Therapy Evaluation Patient Details Name: [name] Stickler, MD MRN: [id number] DOB: [birth year] Today's Date: 10/30/2016   History of Present Illness  Pt is a 83 y/o male admiteed secondary to increased SOB; L upper lobe pneumonia, and COPD exacerbation. Pt had rapid response event on 10/3 secondary to increased respiratory distress. PMH includes DM, CKD, MI, COPD, CAD, CHF, and ischemic cardiomyopathy. Pt also uses oxygen at night time.   Clinical Impression  Pt admitted secondary to problem above with deficits below. PTA, pt was using WC for mobility, but was able to transfer independently without AD. Upon eval, pt very limited by fatigue and decreased cardiopulmonary endurance. Also presenting with weakness, and decreased balance. Spoke with RN prior to session and RN wanting to limit mobility as pt receiving blood transfusion, so only attempted stand pivot to chair. Pt with increased SOB and DOE at 3-4/4 with transfer, however, oxygen sats >90% on 4L. Educated about SNF, however, family very adamant about taking pt home. Educated about need for 24 hour assist and will need max HH services to ensure safety and independence with mobility. Will continue to follow acutely to maximize functional mobility independence and safety.     Follow Up Recommendations Home health PT;Supervision/Assistance - 24 hour (HHOT; HHaide )    Equipment Recommendations  None recommended by PT    Recommendations for Other Services OT consult     Precautions / Restrictions Precautions Precautions: Fall Restrictions Weight Bearing Restrictions: No      Mobility  Bed Mobility Overal bed mobility: Needs Assistance Bed Mobility: Supine to Sit     Supine to sit: Mod assist     General bed mobility comments: Mod A for trunk elevation and assist to scoot hips to EOB. Required increased time and effort to perform. Noted increased SOB and DOE at 3/4 upon sitting. Oxygen sats >90% on 4L.    Transfers Overall transfer level: Needs assistance Equipment used: 1 person hand held assist Transfers: Sit to/from [facility] Sit to Stand: Min assist Stand pivot transfers: Mod assist;+2 safety/equipment       General transfer comment: Pt using momentum to stand. Min A for steadying assist. Mod A for steadying using hand in hand technique to perform stand pivot transfer. Pt with increased SOB and DOE at 3-4/4 after completion of transfer. Oxygen sats >90% on 4L. Cues given for pursed lip breathing.   Ambulation/Gait             General Gait Details: Pt uses WC at baseline.   Stairs            Wheelchair Mobility    Modified Rankin (Stroke Patients Only)       Balance Overall balance assessment: Needs assistance Sitting-balance support: No upper extremity supported;Feet supported Sitting balance-Leahy Scale: Fair     Standing balance support: Bilateral upper extremity supported;During functional activity Standing balance-Leahy Scale: Poor Standing balance comment: Reliant on external assist to perform transfer.                              Pertinent Vitals/Pain Pain Assessment: No/denies pain    Home Living Family/patient expects to be discharged to:: Private residence Living Arrangements: Alone Available Help at Discharge: Family;Personal care attendant;Available PRN/intermittently Type of Home: House Home Access: Stairs to enter Entrance Stairs-Rails: None (but there is a counter ) Technical brewer of Steps: 1 Home Layout: Two level;Laundry or work area in [employer]: Wheelchair -  manual;Shower seat;Bedside commode Additional Comments: Pt's daughter reports family checks in with pt multiple times throughout the day.     Prior Function Level of Independence: Needs assistance   Gait / Transfers Assistance Needed: Pt reports he uses WC or desk chair for mobility. Has scooter for mobility outside of home.  To transfer, sounded like pt used momentum, however, no assist required.   ADL's / Homemaking Assistance Needed: Has an aide that comes MWF in the afternoon to assist with cleaning and meal prep as needed.   Comments: Pt still working in basement Social worker.      Hand Dominance        Extremity/Trunk Assessment   Upper Extremity Assessment Upper Extremity Assessment: Defer to OT evaluation    Lower Extremity Assessment Lower Extremity Assessment: Generalized weakness    Cervical / Trunk Assessment Cervical / Trunk Assessment: Kyphotic  Communication   Communication: HOH  Cognition Arousal/Alertness: Awake/alert Behavior During Therapy: WFL for tasks assessed/performed Overall Cognitive Status: Within Functional Limits for tasks assessed                                        General Comments General comments (skin integrity, edema, etc.): Educated pt and family about current deficits and about SNF. Pt's family adamantly refusing SNF at this time. Educated about increasing supervision to 24/7 to ensure safety with mobility and HHPT to increase safety with mobility. Family agreeable.     Exercises     Assessment/Plan    PT Assessment Patient needs continued PT services  PT Problem List Decreased strength;Decreased activity tolerance;Decreased balance;Cardiopulmonary status limiting activity;Decreased mobility;Decreased knowledge of use of DME       PT Treatment Interventions DME instruction;Therapeutic activities;Functional mobility training;Therapeutic exercise;Balance training;Neuromuscular re-education;Patient/family education;Stair training;Wheelchair mobility training    PT Goals (Current goals can be found in the Care Plan section)  Acute Rehab PT Goals Patient Stated Goal: to feel better  PT Goal Formulation: With patient/family Time For Goal Achievement: 11/13/16 Potential to Achieve Goals: Fair    Frequency Min 3X/week   Barriers to  discharge Decreased caregiver support Lives alone, however, pt frequently checks on pt.     Co-evaluation               AM-PAC PT "6 Clicks" Daily Activity  Outcome Measure Difficulty turning over in bed (including adjusting bedclothes, sheets and blankets)?: Unable Difficulty moving from lying on back to sitting on the side of the bed? : Unable Difficulty sitting down on and standing up from a chair with arms (e.g., wheelchair, bedside commode, etc,.)?: Unable Help needed moving to and from a bed to chair (including a wheelchair)?: A Lot Help needed walking in hospital room?: Total Help needed climbing 3-5 steps with a railing? : Total 6 Click Score: 7    End of Session Equipment Utilized During Treatment: Gait belt;Oxygen Activity Tolerance: Patient limited by fatigue Patient left: in chair;with call bell/phone within reach;with family/visitor present Nurse Communication: Mobility status PT Visit Diagnosis: Unsteadiness on feet (R26.81);Muscle weakness (generalized) (M62.81);Difficulty in walking, not elsewhere classified (R26.2)    Time: 6301-6010 PT Time Calculation (min) (ACUTE ONLY): 28 min   Charges:   PT Evaluation $PT Eval Moderate Complexity: 1 Mod PT Treatments $Therapeutic Activity: 8-22 mins   PT G Codes:        Leighton Ruff, PT, DPT  Acute Rehabilitation Services  Pager: [phone]  [place] 10/30/2016, 5:28 PM

## 2016-10-31 LAB — TYPE AND SCREEN
ABO/RH(D): A POS
Antibody Screen: NEGATIVE
Unit division: 0

## 2016-10-31 LAB — CBC
HEMATOCRIT: 27.1 % — AB (ref 39.0–52.0)
HEMOGLOBIN: 8.6 g/dL — AB (ref 13.0–17.0)
MCH: 22.6 pg — ABNORMAL LOW (ref 26.0–34.0)
MCHC: 31.7 g/dL (ref 30.0–36.0)
MCV: 71.1 fL — AB (ref 78.0–100.0)
Platelets: 59 10*3/uL — ABNORMAL LOW (ref 150–400)
RBC: 3.81 MIL/uL — AB (ref 4.22–5.81)
RDW: 18.4 % — ABNORMAL HIGH (ref 11.5–15.5)
WBC: 8.7 10*3/uL (ref 4.0–10.5)

## 2016-10-31 LAB — BASIC METABOLIC PANEL
Anion gap: 9 (ref 5–15)
BUN: 73 mg/dL — ABNORMAL HIGH (ref 6–20)
CHLORIDE: 100 mmol/L — AB (ref 101–111)
CO2: 27 mmol/L (ref 22–32)
Calcium: 8.3 mg/dL — ABNORMAL LOW (ref 8.9–10.3)
Creatinine, Ser: 2.14 mg/dL — ABNORMAL HIGH (ref 0.61–1.24)
GFR calc non Af Amer: 26 mL/min — ABNORMAL LOW (ref >60)
GFR, EST AFRICAN AMERICAN: 30 mL/min — AB (ref >60)
Glucose, Bld: 150 mg/dL — ABNORMAL HIGH (ref 65–99)
POTASSIUM: 4.4 mmol/L (ref 3.5–5.1)
SODIUM: 136 mmol/L (ref 135–145)

## 2016-10-31 LAB — BPAM RBC
Blood Product Expiration Date: 201810112359
ISSUE DATE / TIME: 201810041410
Unit Type and Rh: 6200

## 2016-10-31 MED ORDER — ALUM & MAG HYDROXIDE-SIMETH 200-200-20 MG/5ML PO SUSP
30.0000 mL | ORAL | Status: DC | PRN
  Administered 2016-10-31: 30 mL via ORAL
  Filled 2016-10-31: qty 30

## 2016-10-31 MED ORDER — PREDNISONE 20 MG PO TABS
50.0000 mg | ORAL_TABLET | Freq: Every day | ORAL | Status: DC
  Administered 2016-11-01 – 2016-11-02 (×2): 50 mg via ORAL
  Filled 2016-10-31 (×2): qty 2

## 2016-10-31 MED ORDER — METHYLPREDNISOLONE SODIUM SUCC 125 MG IJ SOLR
60.0000 mg | Freq: Two times a day (BID) | INTRAMUSCULAR | Status: AC
  Administered 2016-10-31 (×2): 60 mg via INTRAVENOUS
  Filled 2016-10-31 (×2): qty 2

## 2016-10-31 NOTE — Consult Note (Signed)
[facility] Primary Care Navigator  10/31/2016  [name] Azure, MD February 18, 1925 [id number]   Met with patient and daughter [name] [name]) at the bedside to identify possible discharge needs. Patient is very hard of hearing.  Patient reports having "difficulty breathing" that hadled to this admission. He endorses Dr. Lendon [facility] as theprimary care provider.   Patient [place] pharmacy on [facility] to obtain medications without any problem so far.   Daughter reportsthat patient is managinghismedications at home with use of "pill box" system filled weekly.   Patient's daughter verbalized that she providestransportation to his  doctors'appointments.  Patient lives alone but his daughters [name] [name] and [name] [name]) are supportive of his care and assists with his needs. Daughter mentioned that patient has a private pay aide [name] [name]) from [employer] that comes 3 times a week, 2 - 6 hours/ day to assist with his care needs.  Discharge plan is home with home health services per therapy since daughters "want him back home to his routine where he is happy".   Patient/ daughter expressed understanding to call primary care provider's office for a post discharge follow-up appointment within a week or sooner if needs arise.Patient letter (with PCP's contact number) was provided as areminder.  Explained to daughter and patientregardingTHN CM services available for health management at home but denied any current needs or concerns at this time and states that patient is being managed well at home with oxygen use, breathing treatments, follow-up with providers when needed, assistance from daughters and aide. According to daughter, patient is aware and knowledgeable on ways to manage chronic health issues at home.  However, opted and verbally agreed forEMMI Pneumonia Calls tofollow up with his  recovery.Per daughter, she prefers to be called since patient is very hard of hearing.   Referral to EMMI Pneumonia Calls made to follow-up patient after discharge.  Patient and daughter was made awareand had verbalizedunderstanding to seekreferral to [facility] care managementfrom primary care provider ifdeemed necessary andappropriatefor services in the future.  [facility] care management information provided for future needs that he may have.    For questions, please contact:  [name] [name], BSN, RN- [facility] Primary Care Navigator  Telephone: [phone] [place]

## 2016-10-31 NOTE — Progress Notes (Signed)
RT to put pt on BIPAP. Pt refused at this time and RT advised to call if needed it. PT VS are stable. RN aware and RT to monitor as needed.

## 2016-10-31 NOTE — Progress Notes (Signed)
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 male with medical history significant for CAD status post stent, chronic systolic heart failure/SEM, cardiac arrest with ventricular fibrillation 2011, COPD on 2.5 L at home at night, chronic kidney disease, urinary retention and self cath, anemia, diverticulosis, macular degeneration presents to emergency Department chief complaint worsening shortness of breath increased cough. Initial evaluation reveals acute on chronic respiratory failure and sepsis likely related to an early pneumonia in the setting of mild COPD exacerbation.   Assessment & Plan:    Acute on chronic respiratory failure (HCC) - due to CAP/COPD exacerbation -improving, off BIPAP -continue Abx, change IV Solumedrol to prednisone tomorrow, continue nebs -reps virus panel negative -weaned O2 down to 2.5L  Microcytic anemia -acute on chronic, Hb down to Anemia -anemia panel with severe Iron defi, no overt bleeding -transfused 1 unit PRBC, advised pt and daughter to consider Po or IV Iron per PCP -conservative mgt due to age/co-morbidities   Sepsis -clinically improved -lactate normalized -continue Abx, FU Blood Cx-NGTD   AKI on CKD3 -due to sepsis, dehydration, etc -now with foley, he self catheterizes 6x a day -slight improvement, stop IVF, Cozaar on hold   COPD exacerbation. -as above, continue ABx, nebs and steroids -on 2.5L Home O2   CAD/cardiomyopathy/ chronic systolic heart failure  -Of note patient survived a prolonged out of hospital cardiac arrest with ventricular fibrillation in 2011 status post AICD. Last echo 2015 EF of 73% grade 1 diastolic dysfunction -Continue Plavix and Crestor -resume metoprolol -stable  DVT prophylaxis: SCDs for now, on ASA and plavix with very low Hb of 6.8 Code Status: full  Family  Communication: daughter at bedside  Disposition Plan: Tx to tele  Consultants:      Procedures:   Antimicrobials:  Rocephin/zithromax  Subjective: Breathing better, cough better  Objective: Vitals:   10/31/16 0457 10/31/16 0815 10/31/16 0902 10/31/16 1133  BP:  122/66 122/65 128/63  Pulse:  68 67 82  Resp:  18 14 17   Temp:    (!) 97.5 F (36.4 C)  TempSrc:    Oral  SpO2:   97% 97%  Weight: 75.7 kg (166 lb 12.8 oz)     Height:        Intake/Output Summary (Last 24 hours) at 10/31/16 1234 Last data filed at 10/31/16 0945  Gross per 24 hour  Intake           599.33 ml  Output              750 ml  Net          -150.67 ml   Filed Weights   10/29/16 1213 10/30/16 0400 10/31/16 0457  Weight: 72.5 kg (159 lb 14.4 oz) 74.7 kg (164 lb 9.6 oz) 75.7 kg (166 lb 12.8 oz)    Examination:  Gen: Awake, Alert, Oriented X 3, very hard of hearing HEENT: PERRLA, Neck supple, no JVD Lungs: few scattered ronchi , no wheezes CVS: RRR,No Gallops,Rubs or new Murmurs Abd: soft, Non tender, non distended, BS present Extremities: No Cyanosis, Clubbing or edema Skin: no new rashes Psychiatry: Judgement and insight appear normal. Mood & affect appropriate.     Data Reviewed:   CBC:  Recent Labs Lab 10/29/16 0654 10/30/16 0307 10/31/16 0320  WBC 7.2 5.8 8.7  NEUTROABS 6.4  --   --  HGB 8.3* 6.8* 8.6*  HCT 25.2* 21.2* 27.1*  MCV 69.6* 69.5* 71.1*  PLT 59* 55* 59*   Basic Metabolic Panel:  Recent Labs Lab 10/29/16 0654 10/30/16 0307 10/31/16 0320  NA 136 137 136  K 4.7 4.2 4.4  CL 100* 103 100*  CO2 23 23 27   GLUCOSE 106* 228* 150*  BUN 51* 58* 73*  CREATININE 2.10* 2.33* 2.14*  CALCIUM 8.7* 8.2* 8.3*   GFR: Estimated Creatinine Clearance: 25.1 mL/min (A) (by C-G formula based on SCr of 2.14 mg/dL (H)). Liver Function Tests:  Recent Labs Lab 10/29/16 0654 10/30/16 0307  AST 22 21  ALT 12* 13*  ALKPHOS 43 35*  BILITOT 1.0 0.7  PROT 6.9 6.2*  ALBUMIN  3.5 2.9*   No results for input(s): LIPASE, AMYLASE in the last 168 hours. No results for input(s): AMMONIA in the last 168 hours. Coagulation Profile:  Recent Labs Lab 10/29/16 0654  INR 1.16   Cardiac Enzymes:  Recent Labs Lab 10/29/16 0706 10/29/16 1422 10/29/16 1921 10/29/16 2247  TROPONINI <0.03 0.05* 0.05* 0.04*   BNP (last 3 results) No results for input(s): PROBNP in the last 8760 hours. HbA1C: No results for input(s): HGBA1C in the last 72 hours. CBG:  Recent Labs Lab 10/29/16 1627  GLUCAP 155*   Lipid Profile: No results for input(s): CHOL, HDL, LDLCALC, TRIG, CHOLHDL, LDLDIRECT in the last 72 hours. Thyroid Function Tests: No results for input(s): TSH, T4TOTAL, FREET4, T3FREE, THYROIDAB in the last 72 hours. Anemia Panel:  Recent Labs  10/29/16 1422  VITAMINB12 74*  FOLATE 14.6  FERRITIN 329  TIBC 216*  IRON 6*  RETICCTPCT 0.9   Urine analysis:    Component Value Date/Time   COLORURINE AMBER (A) 10/29/2016 1322   APPEARANCEUR CLOUDY (A) 10/29/2016 1322   LABSPEC 1.015 10/29/2016 1322   LABSPEC 1.025 05/01/2010 1454   PHURINE 5.0 10/29/2016 1322   GLUCOSEU NEGATIVE 10/29/2016 1322   HGBUR LARGE (A) 10/29/2016 1322   BILIRUBINUR NEGATIVE 10/29/2016 1322   BILIRUBINUR Negative 05/01/2010 1454   KETONESUR NEGATIVE 10/29/2016 1322   PROTEINUR 30 (A) 10/29/2016 1322   UROBILINOGEN 0.2 06/07/2014 2018   NITRITE NEGATIVE 10/29/2016 1322   LEUKOCYTESUR TRACE (A) 10/29/2016 1322   LEUKOCYTESUR Negative 05/01/2010 1454   Sepsis Labs: @LABRCNTIP (procalcitonin:4,lacticidven:4)  ) Recent Results (from the past 240 hour(s))  Culture, blood (Routine x 2)     Status: None (Preliminary result)   Collection Time: 10/29/16  6:59 AM  Result Value Ref Range Status   Specimen Description BLOOD LEFT ANTECUBITAL  Final   Special Requests   Final    BOTTLES DRAWN AEROBIC AND ANAEROBIC Blood Culture adequate volume   Culture NO GROWTH 1 DAY  Final    Report Status PENDING  Incomplete  Culture, blood (Routine x 2)     Status: None (Preliminary result)   Collection Time: 10/29/16  6:59 AM  Result Value Ref Range Status   Specimen Description BLOOD RIGHT ANTECUBITAL  Final   Special Requests IN PEDIATRIC BOTTLE Blood Culture adequate volume  Final   Culture NO GROWTH 1 DAY  Final   Report Status PENDING  Incomplete  Culture, blood (x 2)     Status: None (Preliminary result)   Collection Time: 10/29/16  2:22 PM  Result Value Ref Range Status   Specimen Description BLOOD RIGHT ARM  Final   Special Requests   Final    BOTTLES DRAWN AEROBIC AND ANAEROBIC Blood Culture adequate volume   Culture  NO GROWTH 1 DAY  Final   Report Status PENDING  Incomplete  Culture, blood (x 2)     Status: None (Preliminary result)   Collection Time: 10/29/16  2:22 PM  Result Value Ref Range Status   Specimen Description BLOOD RIGHT HAND  Final   Special Requests   Final    BOTTLES DRAWN AEROBIC AND ANAEROBIC Blood Culture adequate volume   Culture NO GROWTH 1 DAY  Final   Report Status PENDING  Incomplete  Respiratory Panel by PCR     Status: None   Collection Time: 10/30/16  9:45 AM  Result Value Ref Range Status   Adenovirus NOT DETECTED NOT DETECTED Final   Coronavirus 229E NOT DETECTED NOT DETECTED Final   Coronavirus HKU1 NOT DETECTED NOT DETECTED Final   Coronavirus NL63 NOT DETECTED NOT DETECTED Final   Coronavirus OC43 NOT DETECTED NOT DETECTED Final   Metapneumovirus NOT DETECTED NOT DETECTED Final   Rhinovirus / Enterovirus NOT DETECTED NOT DETECTED Final   Influenza A NOT DETECTED NOT DETECTED Final   Influenza B NOT DETECTED NOT DETECTED Final   Parainfluenza Virus 1 NOT DETECTED NOT DETECTED Final   Parainfluenza Virus 2 NOT DETECTED NOT DETECTED Final   Parainfluenza Virus 3 NOT DETECTED NOT DETECTED Final   Parainfluenza Virus 4 NOT DETECTED NOT DETECTED Final   Respiratory Syncytial Virus NOT DETECTED NOT DETECTED Final   Bordetella  pertussis NOT DETECTED NOT DETECTED Final   Chlamydophila pneumoniae NOT DETECTED NOT DETECTED Final   Mycoplasma pneumoniae NOT DETECTED NOT DETECTED Final         Radiology Studies: Dg Chest Port 1 View  Result Date: 10/29/2016 CLINICAL DATA:  Shortness of Breath EXAM: PORTABLE CHEST 1 VIEW COMPARISON:  Study obtained earlier in the day FINDINGS: Ill-defined opacity remains on the left, indicative of a degree of pneumonia. Lungs elsewhere are clear. The heart size and pulmonary vascularity are normal. There is aortic atherosclerosis. Pacemaker leads are attached the right atrium and right ventricle. There is degenerative change in each shoulder. There is superior migration of each humeral head. IMPRESSION: Pneumonia in left upper lobe, slightly better defined than earlier in the day. Lungs elsewhere clear. Stable cardiac silhouette. There is aortic atherosclerosis. There are chronic rotator cuff tears bilaterally, characterized by elevation in each humeral head. Aortic Atherosclerosis (ICD10-I70.0). Followup PA and lateral chest radiographs recommended in 3-4 weeks following trial of antibiotic therapy to ensure resolution and exclude underlying malignancy. Electronically Signed   By: Lowella Grip III M.D.   On: 10/29/2016 13:29        Scheduled Meds: . arformoterol  15 mcg Nebulization BID  . aspirin EC  81 mg Oral QHS  . budesonide  0.5 mg Nebulization BID  . chlorhexidine  15 mL Mouth Rinse BID  . clopidogrel  75 mg Oral Daily  . docusate sodium  100 mg Oral BID  . ipratropium-albuterol  3 mL Nebulization Q6H  . mouth rinse  15 mL Mouth Rinse q12n4p  . methylPREDNISolone (SOLU-MEDROL) injection  60 mg Intravenous Q12H  . metoprolol tartrate  12.5 mg Oral BID  . [START ON 11/01/2016] predniSONE  50 mg Oral Q breakfast  . rosuvastatin  5 mg Oral q1800   Continuous Infusions: . sodium chloride    . azithromycin Stopped (10/30/16 1136)  . cefTRIAXone (ROCEPHIN)  IV Stopped  (10/31/16 0919)     LOS: 2 days    Time spent: 44min    Domenic Polite, MD [facility] Page  via [email or web address], password [email or web address] After 7PM please contact night-coverage  10/31/2016, 12:34 PM

## 2016-10-31 NOTE — Progress Notes (Signed)
Physical Therapy Treatment Patient Details Name: [name] Henrichs, MD MRN: [id number] DOB: 06-06-1925 Today's Date: 10/31/2016    History of Present Illness Pt is a 83 y/o male admiteed secondary to increased SOB; L upper lobe pneumonia, and COPD exacerbation. Pt had rapid response event on 10/3 secondary to increased respiratory distress. PMH includes DM, CKD, MI, COPD, CAD, CHF, and ischemic cardiomyopathy.     PT Comments    Patient is progressing well toward mobility goals and able to complete functional transfers with min guard for safety and ambulated 24ft with min guard and chair follow. Family present during session. Continue to progress as tolerated with anticipated d/c home with HHPT.    Follow Up Recommendations  Home health PT;Supervision/Assistance - 24 hour (HHOT; HHaide )     Equipment Recommendations  None recommended by PT    Recommendations for Other Services OT consult     Precautions / Restrictions Precautions Precautions: Fall Restrictions Weight Bearing Restrictions: No    Mobility  Bed Mobility               General bed mobility comments: pt OOB in chair upon arrival  Transfers Overall transfer level: Needs assistance Equipment used: Rolling walker (2 wheeled) Transfers: Sit to/from Stand Sit to Stand: Min guard         General transfer comment: min guard for safety; cues for safe hand placement  Ambulation/Gait Ambulation/Gait assistance: Min guard Ambulation Distance (Feet): 60 Feet Assistive device: Rolling walker (2 wheeled) Gait Pattern/deviations: Step-through pattern;Decreased step length - right;Decreased step length - left;Trunk flexed Gait velocity: decreased   General Gait Details: cues for proximity of RW and increased bilat step lengths   Stairs            Wheelchair Mobility    Modified Rankin (Stroke Patients Only)       Balance Overall balance assessment: Needs assistance Sitting-balance support: No  upper extremity supported;Feet supported Sitting balance-Leahy Scale: Fair     Standing balance support: Bilateral upper extremity supported;During functional activity Standing balance-Leahy Scale: Poor                              Cognition Arousal/Alertness: Awake/alert Behavior During Therapy: WFL for tasks assessed/performed Overall Cognitive Status: Within Functional Limits for tasks assessed                                        Exercises      General Comments        Pertinent Vitals/Pain Pain Assessment: No/denies pain    Home Living                      Prior Function            PT Goals (current goals can now be found in the care plan section) Progress towards PT goals: Progressing toward goals    Frequency    Min 3X/week      PT Plan Current plan remains appropriate    Co-evaluation              AM-PAC PT "6 Clicks" Daily Activity  Outcome Measure  Difficulty turning over in bed (including adjusting bedclothes, sheets and blankets)?: A Lot Difficulty moving from lying on back to sitting on the side of the bed? : A Lot Difficulty sitting down on  and standing up from a chair with arms (e.g., wheelchair, bedside commode, etc,.)?: A Lot Help needed moving to and from a bed to chair (including a wheelchair)?: A Little Help needed walking in hospital room?: A Little Help needed climbing 3-5 steps with a railing? : A Lot 6 Click Score: 14    End of Session Equipment Utilized During Treatment: Gait belt;Oxygen Activity Tolerance: Patient tolerated treatment well Patient left: in chair;with call bell/phone within reach;with family/visitor present Nurse Communication: Mobility status PT Visit Diagnosis: Unsteadiness on feet (R26.81);Muscle weakness (generalized) (M62.81);Difficulty in walking, not elsewhere classified (R26.2)     Time: 6384-6659 PT Time Calculation (min) (ACUTE ONLY): 28 min  Charges:   $Gait Training: 8-22 mins $Therapeutic Activity: 8-22 mins                    G Codes:       Earney Navy, PTA Pager: [phone]     Darliss Cheney 10/31/2016, 4:17 PM

## 2016-11-01 LAB — BASIC METABOLIC PANEL
Anion gap: 11 (ref 5–15)
Anion gap: 11 (ref 5–15)
BUN: 76 mg/dL — AB (ref 6–20)
BUN: 78 mg/dL — AB (ref 6–20)
CO2: 23 mmol/L (ref 22–32)
CO2: 24 mmol/L (ref 22–32)
CREATININE: 1.62 mg/dL — AB (ref 0.61–1.24)
CREATININE: 1.6 mg/dL — AB (ref 0.61–1.24)
Calcium: 8.2 mg/dL — ABNORMAL LOW (ref 8.9–10.3)
Calcium: 8.4 mg/dL — ABNORMAL LOW (ref 8.9–10.3)
Chloride: 103 mmol/L (ref 101–111)
Chloride: 103 mmol/L (ref 101–111)
GFR calc Af Amer: 42 mL/min — ABNORMAL LOW (ref >60)
GFR calc Af Amer: 42 mL/min — ABNORMAL LOW (ref >60)
GFR, EST NON AFRICAN AMERICAN: 36 mL/min — AB (ref >60)
GFR, EST NON AFRICAN AMERICAN: 37 mL/min — AB (ref >60)
Glucose, Bld: 162 mg/dL — ABNORMAL HIGH (ref 65–99)
Glucose, Bld: 171 mg/dL — ABNORMAL HIGH (ref 65–99)
Potassium: 5.2 mmol/L — ABNORMAL HIGH (ref 3.5–5.1)
Potassium: 5.4 mmol/L — ABNORMAL HIGH (ref 3.5–5.1)
SODIUM: 137 mmol/L (ref 135–145)
SODIUM: 138 mmol/L (ref 135–145)

## 2016-11-01 LAB — CBC
HCT: 24.3 % — ABNORMAL LOW (ref 39.0–52.0)
HEMOGLOBIN: 7.9 g/dL — AB (ref 13.0–17.0)
MCH: 23.2 pg — ABNORMAL LOW (ref 26.0–34.0)
MCHC: 32.5 g/dL (ref 30.0–36.0)
MCV: 71.5 fL — ABNORMAL LOW (ref 78.0–100.0)
PLATELETS: 52 10*3/uL — AB (ref 150–400)
RBC: 3.4 MIL/uL — ABNORMAL LOW (ref 4.22–5.81)
RDW: 19.1 % — ABNORMAL HIGH (ref 11.5–15.5)
WBC: 4.9 10*3/uL (ref 4.0–10.5)

## 2016-11-01 MED ORDER — SODIUM POLYSTYRENE SULFONATE 15 GM/60ML PO SUSP
15.0000 g | ORAL | Status: AC
  Administered 2016-11-01: 15 g via ORAL
  Filled 2016-11-01: qty 60

## 2016-11-01 MED ORDER — FUROSEMIDE 20 MG PO TABS
20.0000 mg | ORAL_TABLET | Freq: Once | ORAL | Status: AC
  Administered 2016-11-01: 20 mg via ORAL

## 2016-11-01 MED ORDER — SODIUM CHLORIDE 0.9 % IV SOLN
510.0000 mg | Freq: Once | INTRAVENOUS | Status: AC
  Administered 2016-11-01: 510 mg via INTRAVENOUS
  Filled 2016-11-01: qty 17

## 2016-11-01 MED ORDER — CEFPODOXIME PROXETIL 200 MG PO TABS
200.0000 mg | ORAL_TABLET | Freq: Two times a day (BID) | ORAL | Status: DC
Start: 2016-11-01 — End: 2016-11-02
  Administered 2016-11-01 (×2): 200 mg via ORAL
  Filled 2016-11-01 (×3): qty 1

## 2016-11-01 MED ORDER — PREDNISONE 20 MG PO TABS: 20.0000 mg | ORAL_TABLET | Freq: Every day | ORAL | 0 refills | Status: DC

## 2016-11-01 MED ORDER — SODIUM POLYSTYRENE SULFONATE 15 GM/60ML PO SUSP: 15.0000 g | Freq: Once | ORAL | Status: DC

## 2016-11-01 MED ORDER — ALUM & MAG HYDROXIDE-SIMETH 200-200-20 MG/5ML PO SUSP: 15.0000 mL | ORAL | Status: DC | PRN

## 2016-11-01 MED ORDER — CEFPODOXIME PROXETIL 200 MG PO TABS: 200.0000 mg | ORAL_TABLET | Freq: Two times a day (BID) | ORAL | 0 refills | Status: DC

## 2016-11-01 MED ORDER — CEPASTAT 14.5 MG MT LOZG
1.0000 | LOZENGE | OROMUCOSAL | Status: DC | PRN
Start: 2016-11-01 — End: 2016-11-02
  Filled 2016-11-01: qty 9

## 2016-11-01 MED ORDER — FUROSEMIDE 20 MG PO TABS: 20.0000 mg | ORAL_TABLET | Freq: Once | ORAL | Status: DC

## 2016-11-01 NOTE — Progress Notes (Signed)
PROGRESS NOTE    [name] Azure, MD  CWC:[id number] DOB: 1925-09-13 DOA: 10/29/2016 PCP: [name], MD  Brief Narrative: [name] Azure, MD is a very pleasant very [place] 83 y.o. male with medical history significant for CAD status post stent, chronic systolic heart failure/SEM, cardiac arrest with ventricular fibrillation 2011, COPD on 2.5 L at home at night, chronic kidney disease, urinary retention and self cath, anemia, diverticulosis, macular degeneration presents to emergency Department chief complaint worsening shortness of breath increased cough. Initial evaluation reveals acute on chronic respiratory failure and sepsis likely related to an early pneumonia in the setting of mild COPD exacerbation.   Assessment & Plan:    Acute on chronic respiratory failure (HCC) - due to CAP/COPD exacerbation -improving, off BIPAP -continue Abx, changed IV Solumedrol to prednisone continue nebs -reps virus panel negative -weaned O2 down to 2.5L  Microcytic anemia -acute on chronic, Hb down to Anemia -anemia panel with severe Iron defi, no overt bleeding -transfused 1 unit PRBC, advised pt and daughter to consider Po or IV Iron per PCP -conservative mgt due to age/co-morbidities   Sepsis -clinically improved -lactate normalized -continue Abx, FU Blood Cx-NGTD   AKI on CKD3 -due to sepsis, dehydration, etc -now with foley, he self catheterizes 6x a day -improved, stop IVF, Cozaar on hold  Hyperkalemia -likely due to PRBC transfusion -kayexalate, repeat Bmet today   COPD exacerbation. -as above, continue ABx, nebs and steroids -on 2.5L Home O2   CAD/cardiomyopathy/ chronic systolic heart failure  -Of note patient survived a prolonged out of hospital cardiac arrest with ventricular fibrillation in 2011 status post AICD. Last echo 2015 EF of 76% grade 1 diastolic dysfunction -Continue Plavix and Crestor -resume metoprolol -stable  DVT prophylaxis: SCDs for now, on ASA and  plavix with very low Hb of 6.8 Code Status: full  Family Communication: daughter at bedside  Disposition Plan: home later today or in am  Consultants:      Procedures:   Antimicrobials:  Rocephin/zithromax  Subjective: Breathing better, cough better  Objective: Vitals:   11/01/16 0814 11/01/16 0822 11/01/16 1444 11/01/16 1500  BP:  (!) 116/48  135/62  Pulse:    68  Resp:    (!) 21  Temp:    (!) 97.5 F (36.4 C)  TempSrc:    Oral  SpO2: 99% 98% 98% 100%  Weight:      Height:        Intake/Output Summary (Last 24 hours) at 11/01/16 1715 Last data filed at 11/01/16 1500  Gross per 24 hour  Intake                0 ml  Output             1800 ml  Net            -1800 ml   Filed Weights   10/30/16 0400 10/31/16 0457 11/01/16 0500  Weight: 74.7 kg (164 lb 9.6 oz) 75.7 kg (166 lb 12.8 oz) 71.2 kg (157 lb)    Examination:  Gen: Awake, Alert, Oriented X 3, very hard of hearing HEENT: PERRLA, Neck supple, no JVD Lungs: few scattered ronchi , no wheezes CVS: RRR,No Gallops,Rubs or new Murmurs Abd: soft, Non tender, non distended, BS present Extremities: No Cyanosis, Clubbing or edema Skin: no new rashes Psychiatry: Judgement and insight appear normal. Mood & affect appropriate.     Data Reviewed:   CBC:  Recent Labs Lab 10/29/16 0654 10/30/16 1607 10/31/16 0320 11/01/16 3710  WBC 7.2 5.8 8.7 4.9  NEUTROABS 6.4  --   --   --   HGB 8.3* 6.8* 8.6* 7.9*  HCT 25.2* 21.2* 27.1* 24.3*  MCV 69.6* 69.5* 71.1* 71.5*  PLT 59* 55* 59* 52*   Basic Metabolic Panel:  Recent Labs Lab 10/29/16 0654 10/30/16 0307 10/31/16 0320 11/01/16 0609 11/01/16 0917  NA 136 137 136 137 138  K 4.7 4.2 4.4 5.2* 5.4*  CL 100* 103 100* 103 103  CO2 23 23 27 23 24   GLUCOSE 106* 228* 150* 162* 171*  BUN 51* 58* 73* 76* 78*  CREATININE 2.10* 2.33* 2.14* 1.62* 1.60*  CALCIUM 8.7* 8.2* 8.3* 8.2* 8.4*   GFR: Estimated Creatinine Clearance: 31.5 mL/min (A) (by C-G formula  based on SCr of 1.6 mg/dL (H)). Liver Function Tests:  Recent Labs Lab 10/29/16 0654 10/30/16 0307  AST 22 21  ALT 12* 13*  ALKPHOS 43 35*  BILITOT 1.0 0.7  PROT 6.9 6.2*  ALBUMIN 3.5 2.9*   No results for input(s): LIPASE, AMYLASE in the last 168 hours. No results for input(s): AMMONIA in the last 168 hours. Coagulation Profile:  Recent Labs Lab 10/29/16 0654  INR 1.16   Cardiac Enzymes:  Recent Labs Lab 10/29/16 0706 10/29/16 1422 10/29/16 1921 10/29/16 2247  TROPONINI <0.03 0.05* 0.05* 0.04*   BNP (last 3 results) No results for input(s): PROBNP in the last 8760 hours. HbA1C: No results for input(s): HGBA1C in the last 72 hours. CBG:  Recent Labs Lab 10/29/16 1627  GLUCAP 155*   Lipid Profile: No results for input(s): CHOL, HDL, LDLCALC, TRIG, CHOLHDL, LDLDIRECT in the last 72 hours. Thyroid Function Tests: No results for input(s): TSH, T4TOTAL, FREET4, T3FREE, THYROIDAB in the last 72 hours. Anemia Panel: No results for input(s): VITAMINB12, FOLATE, FERRITIN, TIBC, IRON, RETICCTPCT in the last 72 hours. Urine analysis:    Component Value Date/Time   COLORURINE AMBER (A) 10/29/2016 1322   APPEARANCEUR CLOUDY (A) 10/29/2016 1322   LABSPEC 1.015 10/29/2016 1322   LABSPEC 1.025 05/01/2010 1454   PHURINE 5.0 10/29/2016 1322   GLUCOSEU NEGATIVE 10/29/2016 1322   HGBUR LARGE (A) 10/29/2016 1322   BILIRUBINUR NEGATIVE 10/29/2016 1322   BILIRUBINUR Negative 05/01/2010 1454   KETONESUR NEGATIVE 10/29/2016 1322   PROTEINUR 30 (A) 10/29/2016 1322   UROBILINOGEN 0.2 06/07/2014 2018   NITRITE NEGATIVE 10/29/2016 1322   LEUKOCYTESUR TRACE (A) 10/29/2016 1322   LEUKOCYTESUR Negative 05/01/2010 1454   Sepsis Labs: @LABRCNTIP (procalcitonin:4,lacticidven:4)  ) Recent Results (from the past 240 hour(s))  Culture, blood (Routine x 2)     Status: None (Preliminary result)   Collection Time: 10/29/16  6:59 AM  Result Value Ref Range Status   Specimen  Description BLOOD LEFT ANTECUBITAL  Final   Special Requests   Final    BOTTLES DRAWN AEROBIC AND ANAEROBIC Blood Culture adequate volume   Culture NO GROWTH 3 DAYS  Final   Report Status PENDING  Incomplete  Culture, blood (Routine x 2)     Status: None (Preliminary result)   Collection Time: 10/29/16  6:59 AM  Result Value Ref Range Status   Specimen Description BLOOD RIGHT ANTECUBITAL  Final   Special Requests IN PEDIATRIC BOTTLE Blood Culture adequate volume  Final   Culture NO GROWTH 3 DAYS  Final   Report Status PENDING  Incomplete  Culture, blood (x 2)     Status: None (Preliminary result)   Collection Time: 10/29/16  2:22 PM  Result Value Ref Range Status  Specimen Description BLOOD RIGHT ARM  Final   Special Requests   Final    BOTTLES DRAWN AEROBIC AND ANAEROBIC Blood Culture adequate volume   Culture NO GROWTH 3 DAYS  Final   Report Status PENDING  Incomplete  Culture, blood (x 2)     Status: None (Preliminary result)   Collection Time: 10/29/16  2:22 PM  Result Value Ref Range Status   Specimen Description BLOOD RIGHT HAND  Final   Special Requests   Final    BOTTLES DRAWN AEROBIC AND ANAEROBIC Blood Culture adequate volume   Culture NO GROWTH 3 DAYS  Final   Report Status PENDING  Incomplete  Respiratory Panel by PCR     Status: None   Collection Time: 10/30/16  9:45 AM  Result Value Ref Range Status   Adenovirus NOT DETECTED NOT DETECTED Final   Coronavirus 229E NOT DETECTED NOT DETECTED Final   Coronavirus HKU1 NOT DETECTED NOT DETECTED Final   Coronavirus NL63 NOT DETECTED NOT DETECTED Final   Coronavirus OC43 NOT DETECTED NOT DETECTED Final   Metapneumovirus NOT DETECTED NOT DETECTED Final   Rhinovirus / Enterovirus NOT DETECTED NOT DETECTED Final   Influenza A NOT DETECTED NOT DETECTED Final   Influenza B NOT DETECTED NOT DETECTED Final   Parainfluenza Virus 1 NOT DETECTED NOT DETECTED Final   Parainfluenza Virus 2 NOT DETECTED NOT DETECTED Final    Parainfluenza Virus 3 NOT DETECTED NOT DETECTED Final   Parainfluenza Virus 4 NOT DETECTED NOT DETECTED Final   Respiratory Syncytial Virus NOT DETECTED NOT DETECTED Final   Bordetella pertussis NOT DETECTED NOT DETECTED Final   Chlamydophila pneumoniae NOT DETECTED NOT DETECTED Final   Mycoplasma pneumoniae NOT DETECTED NOT DETECTED Final         Radiology Studies: No results found.      Scheduled Meds: . arformoterol  15 mcg Nebulization BID  . aspirin EC  81 mg Oral QHS  . budesonide  0.5 mg Nebulization BID  . cefpodoxime  200 mg Oral Q12H  . chlorhexidine  15 mL Mouth Rinse BID  . clopidogrel  75 mg Oral Daily  . docusate sodium  100 mg Oral BID  . furosemide  20 mg Oral Once  . ipratropium-albuterol  3 mL Nebulization Q6H  . mouth rinse  15 mL Mouth Rinse q12n4p  . metoprolol tartrate  12.5 mg Oral BID  . predniSONE  50 mg Oral Q breakfast  . rosuvastatin  5 mg Oral q1800   Continuous Infusions: . sodium chloride       LOS: 3 days    Time spent: 23min    Domenic Polite, MD [facility] Page via [email or web address], password [email or web address] After 7PM please contact night-coverage  11/01/2016, 5:15 PM

## 2016-11-01 NOTE — Discharge Summary (Deleted)
Physician Discharge Summary  [name] Azure, MD IDP:[id number] DOB: 1925/06/22 DOA: 10/29/2016  PCP: [name], MD  Admit date: 10/29/2016 Discharge date: 11/02/2016  Time spent: 35 minutes  Recommendations for Outpatient Follow-up:  PCP in 1 week, consider repeat CXR in 4-6weeks CBC/Bmet in 1-2weeks, consider IV iron infusions as outpatient  Discharge Diagnoses:  Principal Problem:   Acute on chronic respiratory failure (HCC)   Community-acquired pneumonia   COPD exacerbation   Chronic iron deficiency anemia   Urinary retention-chronic intermittent self catheterizations   Coronary atherosclerosis   URINARY RETENTION   Dyspnea   CHF (congestive heart failure) ([place])   Ischemic cardiomyopathy   Automatic implantable cardioverter-defibrillator in situ   MGUS (monoclonal gammopathy of unknown significance)   COPD with exacerbation (HCC)   Sepsis ([place])   Dysphagia   Acute renal failure superimposed on chronic kidney disease ([place])   CAP (community acquired pneumonia)   Discharge Condition: stable  Diet recommendation: heeart healthy  Filed Weights   10/31/16 0457 11/01/16 0500 11/02/16 0514  Weight: 75.7 kg (166 lb 12.8 oz) 71.2 kg (157 lb) 72.1 kg (159 lb)    History of present illness:  [name] [name], MDis a very pleasant very [place] 83 y.o.malewith medical history significant for CAD status post stent, chronic systolic heart failure/SEM, cardiac arrest with ventricular fibrillation 2011, COPD on 2.5 L at home at night, chronic kidney disease, urinary retention and self cath, anemia, diverticulosis, macular degeneration presents to emergency Department chief complaint worsening shortness of breath increased cough. Initial evaluation reveals acute on chronic respiratory failure and sepsis likely related to an early pneumonia in the setting of mild COPD exacerbation.  Hospital Course:  Acute on chronic respiratory failure (HCC) - due to CAP/COPD exacerbation, required  BiPAP on admission, since improved -treated with IV ceftriaxone and azithromycin, IV Solu-Medrol and nebulizations -clinically improved, respiratory virus panel negative, oxygen requirements were weaned down to 2.5-3 L which is his baseline -discharged home on oral Vantin, And prednisone -Setup with home health physical therapy and RN services   Sepsis -clinically improved, sepsis physiology resolved -lactate normalized -continue Abx, FU Blood Cx-NGTD -see above   chronic iron deficiency anemia -acute on chronic, Hb down to 6.8 the mid 7 range after admission -anemia panel with severe Iron defi, no overt bleeding, per patient and daughter he's had severe iron deficiency anemia for very many years and has not responded appropriately to iron infusions etc. And hence these were stopped -Patient received 1 unit of PRBC and 1 dose of IV iron this admission -Please monitor CBC at follow-up and consider periodic iron infusions as felt appropriate   AKI on CKD3 -due to sepsis, dehydration, etc -now with foley, he self catheterizes 6x a day -improved briefly with hydration, creatinine down to 1.3 today which is better than his recent baseline -Held Cozaar since blood pressures have been in the low 100-120 range this admission and with AKI  Hyperkalemia -likely due to PRBC transfusion -was soft, treated with Kayexalate, repeat potassium level is 4.7 today   COPD exacerbation. -as above, continue ABx, nebs and steroids -on 2.5L Home O2   CAD/cardiomyopathy/ chronic systolic heart failure  -Of note patient survived a prolonged out of hospital cardiac arrest with ventricular fibrillation in 2011 status post AICD. Last echo 2015 EF of 44% grade 1 diastolic dysfunction -Continue Plavix and Crestor, resumed metoprolol -stable  Discharge Exam: Vitals:   11/02/16 0846 11/02/16 0849  BP:    Pulse:    Resp:  Temp:    SpO2: 98% 98%    General: AAO 3 Cardiovascular:S1-S2 regular  rate and rhythm Respiratory: improved air movement, no wheezes,scattered rhonchi at left base  Discharge Instructions   Discharge Instructions    Diet - low sodium heart healthy    Complete by:  As directed    Diet - low sodium heart healthy    Complete by:  As directed    Increase activity slowly    Complete by:  As directed    Increase activity slowly    Complete by:  As directed      Current Discharge Medication List    START taking these medications   Details  cefpodoxime (VANTIN) 200 MG tablet Take 1 tablet (200 mg total) by mouth 2 (two) times daily. For 4days Qty: 8 tablet, Refills: 0    predniSONE (DELTASONE) 20 MG tablet Take 1-2 tablets (20-40 mg total) by mouth daily with breakfast. Take 40mg  for 2days then 20mg  for 2days then STOP Qty: 6 tablet, Refills: 0      CONTINUE these medications which have NOT CHANGED   Details  albuterol (PROVENTIL) (2.5 MG/3ML) 0.083% nebulizer solution Take 2.5 mg by nebulization every 4 (four) hours as needed for wheezing or shortness of breath.    ALPRAZolam (XANAX) 0.25 MG tablet Take 0.25 mg by mouth at bedtime as needed for sleep.     arformoterol (BROVANA) 15 MCG/2ML NEBU Take 15 mcg by nebulization 2 (two) times daily.    aspirin EC 81 MG tablet Take 1 tablet (81 mg total) by mouth at bedtime. Qty: 30 tablet, Refills: 0    budesonide (PULMICORT) 0.5 MG/2ML nebulizer solution Take 2 mLs (0.5 mg total) by nebulization 2 (two) times daily. Qty: 120 mL, Refills: 3    clopidogrel (PLAVIX) 75 MG tablet Take 1 tablet (75 mg total) by mouth daily. Qty: 90 tablet, Refills: 1    docusate sodium (COLACE) 100 MG capsule Take 100 mg by mouth 2 (two) times daily as needed for mild constipation.    guaiFENesin (MUCINEX) 600 MG 12 hr tablet Take 600 mg by mouth 2 (two) times daily as needed for cough or to loosen phlegm.     metoprolol tartrate (LOPRESSOR) 25 MG tablet Take 12.5 mg by mouth 2 (two) times daily. Reported on 06/06/2015     nitroGLYCERIN (NITROSTAT) 0.4 MG SL tablet Place 0.4 mg under the tongue every 5 (five) minutes as needed for chest pain. UP TO 3 DOSES THEN CALL EMS    OXYGEN Inhale 2 L into the lungs at bedtime.     rosuvastatin (CRESTOR) 5 MG tablet Take 1 tablet (5 mg total) by mouth daily at 6 PM. Qty: 90 tablet, Refills: 3    trimethoprim (TRIMPEX) 100 MG tablet Take 100 mg by mouth daily.      STOP taking these medications     ibuprofen (ADVIL,MOTRIN) 200 MG tablet      losartan (COZAAR) 25 MG tablet        No Known Allergies Follow-up Information    [name], MD. Schedule an appointment as soon as possible for a visit in 1 week(s).   Specialty:  Internal Medicine Contact information: [address] [place] [address] [phone]            The results of significant diagnostics from this hospitalization (including imaging, microbiology, ancillary and laboratory) are listed below for reference.    Significant Diagnostic Studies: Dg Chest [facility] 1 View  Result  Date: 10/29/2016 CLINICAL DATA:  Shortness of Breath EXAM: PORTABLE CHEST 1 VIEW COMPARISON:  Study obtained earlier in the day FINDINGS: Ill-defined opacity remains on the left, indicative of a degree of pneumonia. Lungs elsewhere are clear. The heart size and pulmonary vascularity are normal. There is aortic atherosclerosis. Pacemaker leads are attached the right atrium and right ventricle. There is degenerative change in each shoulder. There is superior migration of each humeral head. IMPRESSION: Pneumonia in left upper lobe, slightly better defined than earlier in the day. Lungs elsewhere clear. Stable cardiac silhouette. There is aortic atherosclerosis. There are chronic rotator cuff tears bilaterally, characterized by elevation in each humeral head. Aortic Atherosclerosis (ICD10-I70.0). Followup PA and lateral chest radiographs recommended in 3-4 weeks following trial of antibiotic therapy to ensure  resolution and exclude underlying malignancy. Electronically Signed   By: Lowella Grip III M.D.   On: 10/29/2016 13:29   Dg Chest Portable 1 View  Result Date: 10/29/2016 CLINICAL DATA:  Shortness of breath with wheezing.  Cough. EXAM: PORTABLE CHEST 1 VIEW COMPARISON:  Jun 14, 2015 FINDINGS: Lungs are mildly hyperexpanded. There is ill-defined opacity in the left mid lung region, suspicious for a developing infiltrate/pneumonia. Lungs elsewhere are clear. Heart size and pulmonary vascularity are normal. Pacemaker leads are attached to the right atrium and right ventricle. There is aortic atherosclerosis. No adenopathy. There is degenerative change in the shoulders. There is superior migration of the right humeral head. IMPRESSION: Rather subtle ill-defined opacity in the left mid lung region. Suspect developing pneumonia. Lungs elsewhere clear. Heart size within normal limits. There is aortic atherosclerosis. Pacemaker leads attached to right atrium and right ventricle. Chronic rotator cuff tear on the right with superior migration of the right humeral head. Aortic Atherosclerosis (ICD10-I70.0). Followup PA and lateral chest radiographs recommended in 3-4 weeks following trial of antibiotic therapy to ensure resolution and exclude underlying malignancy. Electronically Signed   By: Lowella Grip III M.D.   On: 10/29/2016 07:37    Microbiology: Recent Results (from the past 240 hour(s))  Culture, blood (Routine x 2)     Status: None (Preliminary result)   Collection Time: 10/29/16  6:59 AM  Result Value Ref Range Status   Specimen Description BLOOD LEFT ANTECUBITAL  Final   Special Requests   Final    BOTTLES DRAWN AEROBIC AND ANAEROBIC Blood Culture adequate volume   Culture NO GROWTH 3 DAYS  Final   Report Status PENDING  Incomplete  Culture, blood (Routine x 2)     Status: None (Preliminary result)   Collection Time: 10/29/16  6:59 AM  Result Value Ref Range Status   Specimen  Description BLOOD RIGHT ANTECUBITAL  Final   Special Requests IN PEDIATRIC BOTTLE Blood Culture adequate volume  Final   Culture NO GROWTH 3 DAYS  Final   Report Status PENDING  Incomplete  Culture, blood (x 2)     Status: None (Preliminary result)   Collection Time: 10/29/16  2:22 PM  Result Value Ref Range Status   Specimen Description BLOOD RIGHT ARM  Final   Special Requests   Final    BOTTLES DRAWN AEROBIC AND ANAEROBIC Blood Culture adequate volume   Culture NO GROWTH 3 DAYS  Final   Report Status PENDING  Incomplete  Culture, blood (x 2)     Status: None (Preliminary result)   Collection Time: 10/29/16  2:22 PM  Result Value Ref Range Status   Specimen Description BLOOD RIGHT HAND  Final   Special Requests  Final    BOTTLES DRAWN AEROBIC AND ANAEROBIC Blood Culture adequate volume   Culture NO GROWTH 3 DAYS  Final   Report Status PENDING  Incomplete  Respiratory Panel by PCR     Status: None   Collection Time: 10/30/16  9:45 AM  Result Value Ref Range Status   Adenovirus NOT DETECTED NOT DETECTED Final   Coronavirus 229E NOT DETECTED NOT DETECTED Final   Coronavirus HKU1 NOT DETECTED NOT DETECTED Final   Coronavirus NL63 NOT DETECTED NOT DETECTED Final   Coronavirus OC43 NOT DETECTED NOT DETECTED Final   Metapneumovirus NOT DETECTED NOT DETECTED Final   Rhinovirus / Enterovirus NOT DETECTED NOT DETECTED Final   Influenza A NOT DETECTED NOT DETECTED Final   Influenza B NOT DETECTED NOT DETECTED Final   Parainfluenza Virus 1 NOT DETECTED NOT DETECTED Final   Parainfluenza Virus 2 NOT DETECTED NOT DETECTED Final   Parainfluenza Virus 3 NOT DETECTED NOT DETECTED Final   Parainfluenza Virus 4 NOT DETECTED NOT DETECTED Final   Respiratory Syncytial Virus NOT DETECTED NOT DETECTED Final   Bordetella pertussis NOT DETECTED NOT DETECTED Final   Chlamydophila pneumoniae NOT DETECTED NOT DETECTED Final   Mycoplasma pneumoniae NOT DETECTED NOT DETECTED Final     Labs: Basic  Metabolic Panel:  Recent Labs Lab 10/30/16 0307 10/31/16 0320 11/01/16 0609 11/01/16 0917 11/02/16 0746  NA 137 136 137 138 140  K 4.2 4.4 5.2* 5.4* 4.5  CL 103 100* 103 103 106  CO2 23 27 23 24 27   GLUCOSE 228* 150* 162* 171* 115*  BUN 58* 73* 76* 78* 63*  CREATININE 2.33* 2.14* 1.62* 1.60* 1.39*  CALCIUM 8.2* 8.3* 8.2* 8.4* 8.2*   Liver Function Tests:  Recent Labs Lab 10/29/16 0654 10/30/16 0307  AST 22 21  ALT 12* 13*  ALKPHOS 43 35*  BILITOT 1.0 0.7  PROT 6.9 6.2*  ALBUMIN 3.5 2.9*   No results for input(s): LIPASE, AMYLASE in the last 168 hours. No results for input(s): AMMONIA in the last 168 hours. CBC:  Recent Labs Lab 10/29/16 0654 10/30/16 0307 10/31/16 0320 11/01/16 0609  WBC 7.2 5.8 8.7 4.9  NEUTROABS 6.4  --   --   --   HGB 8.3* 6.8* 8.6* 7.9*  HCT 25.2* 21.2* 27.1* 24.3*  MCV 69.6* 69.5* 71.1* 71.5*  PLT 59* 55* 59* 52*   Cardiac Enzymes:  Recent Labs Lab 10/29/16 0706 10/29/16 1422 10/29/16 1921 10/29/16 2247  TROPONINI <0.03 0.05* 0.05* 0.04*   BNP: BNP (last 3 results)  Recent Labs  10/29/16 0706  BNP 56.8    ProBNP (last 3 results) No results for input(s): PROBNP in the last 8760 hours.  CBG:  Recent Labs Lab 10/29/16 1627  GLUCAP 155*       SignedDomenic Polite MD.  [facility] 11/02/2016, 11:18 AM

## 2016-11-01 NOTE — Progress Notes (Signed)
Pt has an order for BIPAP PRN. Pt refused Bipap at this time. VS are stable and pt resting comfortably in chair. RT to cont to monitor and pt advised to call if he felt SOB or needed anything.

## 2016-11-02 LAB — BASIC METABOLIC PANEL
Anion gap: 7 (ref 5–15)
BUN: 63 mg/dL — AB (ref 6–20)
CALCIUM: 8.2 mg/dL — AB (ref 8.9–10.3)
CO2: 27 mmol/L (ref 22–32)
CREATININE: 1.39 mg/dL — AB (ref 0.61–1.24)
Chloride: 106 mmol/L (ref 101–111)
GFR calc Af Amer: 50 mL/min — ABNORMAL LOW (ref >60)
GFR calc non Af Amer: 43 mL/min — ABNORMAL LOW (ref >60)
GLUCOSE: 115 mg/dL — AB (ref 65–99)
Potassium: 4.5 mmol/L (ref 3.5–5.1)
Sodium: 140 mmol/L (ref 135–145)

## 2016-11-02 MED ORDER — NITROGLYCERIN 0.4 MG SL SUBL: 0.4000 mg | SUBLINGUAL_TABLET | SUBLINGUAL | 1 refills | Status: AC | PRN

## 2016-11-02 MED ORDER — IPRATROPIUM-ALBUTEROL 0.5-2.5 (3) MG/3ML IN SOLN: 3.0000 mL | Freq: Two times a day (BID) | RESPIRATORY_TRACT | Status: DC

## 2016-11-02 NOTE — Discharge Summary (Signed)
Physician Discharge Summary  [name] Azure, MD AYT:[id number] DOB: [birth year] DOA: 10/29/2016  PCP: [name], MD  Admit date: 10/29/2016 Discharge date: 11/02/2016  Time spent: 35 minutes  Recommendations for Outpatient Follow-up:  PCP in 1 week, consider repeat CXR in 4-6weeks CBC/Bmet in 1-2weeks, consider IV iron infusions as outpatient  Discharge Diagnoses:  Principal Problem:   Acute on chronic respiratory failure (HCC)   Community-acquired pneumonia   COPD exacerbation   Chronic iron deficiency anemia   Urinary retention-chronic intermittent self catheterizations   Coronary atherosclerosis   URINARY RETENTION   Dyspnea   CHF (congestive heart failure) ([place])   Ischemic cardiomyopathy   Automatic implantable cardioverter-defibrillator in situ   MGUS (monoclonal gammopathy of unknown significance)   COPD with exacerbation (HCC)   Sepsis ([place])   Dysphagia   Acute renal failure superimposed on chronic kidney disease ([place])   CAP (community acquired pneumonia)   Discharge Condition: stable  Diet recommendation: heeart healthy  Filed Weights   10/31/16 0457 11/01/16 0500 11/02/16 0514  Weight: 75.7 kg (166 lb 12.8 oz) 71.2 kg (157 lb) 72.1 kg (159 lb)    History of present illness:  [name] [name], MDis a very pleasant very [place] 83 y.o.malewith medical history significant for CAD status post stent, chronic systolic heart failure/SEM, cardiac arrest with ventricular fibrillation 2011, COPD on 2.5 L at home at night, chronic kidney disease, urinary retention and self cath, anemia, diverticulosis, macular degeneration presents to emergency Department chief complaint worsening shortness of breath increased cough. Initial evaluation reveals acute on chronic respiratory failure and sepsis likely related to an early pneumonia in the setting of mild COPD exacerbation.  Hospital Course:  Acute on chronic respiratory failure (HCC) - due to CAP/COPD exacerbation, required  BiPAP on admission, since improved -treated with IV ceftriaxone and azithromycin, IV Solu-Medrol and nebulizations -clinically improved, respiratory virus panel negative, oxygen requirements were weaned down to 2.5-3 L which is his baseline -discharged home on oral Vantin, And prednisone -Setup with home health physical therapy and RN services   Sepsis -clinically improved, sepsis physiology resolved -lactate normalized -continue Abx, FU Blood Cx-NGTD -see above   chronic iron deficiency anemia -acute on chronic, Hb down to 6.8 the mid 7 range after admission -anemia panel with severe Iron defi, no overt bleeding, per patient and daughter he's had severe iron deficiency anemia for very many years and has not responded appropriately to iron infusions etc. And hence these were stopped -Patient received 1 unit of PRBC and 1 dose of IV iron this admission -Please monitor CBC at follow-up and consider periodic iron infusions as felt appropriate   AKI on CKD3 -due to sepsis, dehydration, etc -now with foley, he self catheterizes 6x a day -improved briefly with hydration, creatinine down to 1.3 today which is better than his recent baseline -Held Cozaar since blood pressures have been in the low 100-120 range this admission and with AKI  Hyperkalemia -likely due to PRBC transfusion -was soft, treated with Kayexalate, repeat potassium level is 4.7 today   COPD exacerbation. -as above, continue ABx, nebs and steroids -on 2.5L Home O2   CAD/cardiomyopathy/ chronic systolic heart failure  -Of note patient survived a prolonged out of hospital cardiac arrest with ventricular fibrillation in 2011 status post AICD. Last echo 2015 EF of 35% grade 1 diastolic dysfunction -Continue Plavix and Crestor, resumed metoprolol -stable  Discharge Exam: Vitals:   11/02/16 0846 11/02/16 0849  BP:    Pulse:    Resp:  Temp:    SpO2: 98% 98%    General: AAO 3 Cardiovascular:S1-S2 regular  rate and rhythm Respiratory: improved air movement, no wheezes,scattered rhonchi at left base  Discharge Instructions   Discharge Instructions    Diet - low sodium heart healthy    Complete by:  As directed    Diet - low sodium heart healthy    Complete by:  As directed    Increase activity slowly    Complete by:  As directed    Increase activity slowly    Complete by:  As directed      Current Discharge Medication List    START taking these medications   Details  cefpodoxime (VANTIN) 200 MG tablet Take 1 tablet (200 mg total) by mouth 2 (two) times daily. For 4days Qty: 8 tablet, Refills: 0    predniSONE (DELTASONE) 20 MG tablet Take 1-2 tablets (20-40 mg total) by mouth daily with breakfast. Take 40mg  for 2days then 20mg  for 2days then STOP Qty: 6 tablet, Refills: 0      CONTINUE these medications which have NOT CHANGED   Details  albuterol (PROVENTIL) (2.5 MG/3ML) 0.083% nebulizer solution Take 2.5 mg by nebulization every 4 (four) hours as needed for wheezing or shortness of breath.    ALPRAZolam (XANAX) 0.25 MG tablet Take 0.25 mg by mouth at bedtime as needed for sleep.     arformoterol (BROVANA) 15 MCG/2ML NEBU Take 15 mcg by nebulization 2 (two) times daily.    aspirin EC 81 MG tablet Take 1 tablet (81 mg total) by mouth at bedtime. Qty: 30 tablet, Refills: 0    budesonide (PULMICORT) 0.5 MG/2ML nebulizer solution Take 2 mLs (0.5 mg total) by nebulization 2 (two) times daily. Qty: 120 mL, Refills: 3    clopidogrel (PLAVIX) 75 MG tablet Take 1 tablet (75 mg total) by mouth daily. Qty: 90 tablet, Refills: 1    docusate sodium (COLACE) 100 MG capsule Take 100 mg by mouth 2 (two) times daily as needed for mild constipation.    guaiFENesin (MUCINEX) 600 MG 12 hr tablet Take 600 mg by mouth 2 (two) times daily as needed for cough or to loosen phlegm.     metoprolol tartrate (LOPRESSOR) 25 MG tablet Take 12.5 mg by mouth 2 (two) times daily. Reported on 06/06/2015     nitroGLYCERIN (NITROSTAT) 0.4 MG SL tablet Place 0.4 mg under the tongue every 5 (five) minutes as needed for chest pain. UP TO 3 DOSES THEN CALL EMS    OXYGEN Inhale 2 L into the lungs at bedtime.     rosuvastatin (CRESTOR) 5 MG tablet Take 1 tablet (5 mg total) by mouth daily at 6 PM. Qty: 90 tablet, Refills: 3    trimethoprim (TRIMPEX) 100 MG tablet Take 100 mg by mouth daily.      STOP taking these medications     ibuprofen (ADVIL,MOTRIN) 200 MG tablet      losartan (COZAAR) 25 MG tablet        No Known Allergies Follow-up Information    [name], MD. Schedule an appointment as soon as possible for a visit in 1 week(s).   Specialty:  Internal Medicine Contact information: [address] [place] [address] [phone]            The results of significant diagnostics from this hospitalization (including imaging, microbiology, ancillary and laboratory) are listed below for reference.    Significant Diagnostic Studies: Dg Chest [facility] 1 View  Result  Date: 10/29/2016 CLINICAL DATA:  Shortness of Breath EXAM: PORTABLE CHEST 1 VIEW COMPARISON:  Study obtained earlier in the day FINDINGS: Ill-defined opacity remains on the left, indicative of a degree of pneumonia. Lungs elsewhere are clear. The heart size and pulmonary vascularity are normal. There is aortic atherosclerosis. Pacemaker leads are attached the right atrium and right ventricle. There is degenerative change in each shoulder. There is superior migration of each humeral head. IMPRESSION: Pneumonia in left upper lobe, slightly better defined than earlier in the day. Lungs elsewhere clear. Stable cardiac silhouette. There is aortic atherosclerosis. There are chronic rotator cuff tears bilaterally, characterized by elevation in each humeral head. Aortic Atherosclerosis (ICD10-I70.0). Followup PA and lateral chest radiographs recommended in 3-4 weeks following trial of antibiotic therapy to ensure  resolution and exclude underlying malignancy. Electronically Signed   By: Lowella Grip III M.D.   On: 10/29/2016 13:29   Dg Chest Portable 1 View  Result Date: 10/29/2016 CLINICAL DATA:  Shortness of breath with wheezing.  Cough. EXAM: PORTABLE CHEST 1 VIEW COMPARISON:  Jun 14, 2015 FINDINGS: Lungs are mildly hyperexpanded. There is ill-defined opacity in the left mid lung region, suspicious for a developing infiltrate/pneumonia. Lungs elsewhere are clear. Heart size and pulmonary vascularity are normal. Pacemaker leads are attached to the right atrium and right ventricle. There is aortic atherosclerosis. No adenopathy. There is degenerative change in the shoulders. There is superior migration of the right humeral head. IMPRESSION: Rather subtle ill-defined opacity in the left mid lung region. Suspect developing pneumonia. Lungs elsewhere clear. Heart size within normal limits. There is aortic atherosclerosis. Pacemaker leads attached to right atrium and right ventricle. Chronic rotator cuff tear on the right with superior migration of the right humeral head. Aortic Atherosclerosis (ICD10-I70.0). Followup PA and lateral chest radiographs recommended in 3-4 weeks following trial of antibiotic therapy to ensure resolution and exclude underlying malignancy. Electronically Signed   By: Lowella Grip III M.D.   On: 10/29/2016 07:37    Microbiology: Recent Results (from the past 240 hour(s))  Culture, blood (Routine x 2)     Status: None (Preliminary result)   Collection Time: 10/29/16  6:59 AM  Result Value Ref Range Status   Specimen Description BLOOD LEFT ANTECUBITAL  Final   Special Requests   Final    BOTTLES DRAWN AEROBIC AND ANAEROBIC Blood Culture adequate volume   Culture NO GROWTH 3 DAYS  Final   Report Status PENDING  Incomplete  Culture, blood (Routine x 2)     Status: None (Preliminary result)   Collection Time: 10/29/16  6:59 AM  Result Value Ref Range Status   Specimen  Description BLOOD RIGHT ANTECUBITAL  Final   Special Requests IN PEDIATRIC BOTTLE Blood Culture adequate volume  Final   Culture NO GROWTH 3 DAYS  Final   Report Status PENDING  Incomplete  Culture, blood (x 2)     Status: None (Preliminary result)   Collection Time: 10/29/16  2:22 PM  Result Value Ref Range Status   Specimen Description BLOOD RIGHT ARM  Final   Special Requests   Final    BOTTLES DRAWN AEROBIC AND ANAEROBIC Blood Culture adequate volume   Culture NO GROWTH 3 DAYS  Final   Report Status PENDING  Incomplete  Culture, blood (x 2)     Status: None (Preliminary result)   Collection Time: 10/29/16  2:22 PM  Result Value Ref Range Status   Specimen Description BLOOD RIGHT HAND  Final   Special Requests  Final    BOTTLES DRAWN AEROBIC AND ANAEROBIC Blood Culture adequate volume   Culture NO GROWTH 3 DAYS  Final   Report Status PENDING  Incomplete  Respiratory Panel by PCR     Status: None   Collection Time: 10/30/16  9:45 AM  Result Value Ref Range Status   Adenovirus NOT DETECTED NOT DETECTED Final   Coronavirus 229E NOT DETECTED NOT DETECTED Final   Coronavirus HKU1 NOT DETECTED NOT DETECTED Final   Coronavirus NL63 NOT DETECTED NOT DETECTED Final   Coronavirus OC43 NOT DETECTED NOT DETECTED Final   Metapneumovirus NOT DETECTED NOT DETECTED Final   Rhinovirus / Enterovirus NOT DETECTED NOT DETECTED Final   Influenza A NOT DETECTED NOT DETECTED Final   Influenza B NOT DETECTED NOT DETECTED Final   Parainfluenza Virus 1 NOT DETECTED NOT DETECTED Final   Parainfluenza Virus 2 NOT DETECTED NOT DETECTED Final   Parainfluenza Virus 3 NOT DETECTED NOT DETECTED Final   Parainfluenza Virus 4 NOT DETECTED NOT DETECTED Final   Respiratory Syncytial Virus NOT DETECTED NOT DETECTED Final   Bordetella pertussis NOT DETECTED NOT DETECTED Final   Chlamydophila pneumoniae NOT DETECTED NOT DETECTED Final   Mycoplasma pneumoniae NOT DETECTED NOT DETECTED Final     Labs: Basic  Metabolic Panel:  Recent Labs Lab 10/30/16 0307 10/31/16 0320 11/01/16 0609 11/01/16 0917 11/02/16 0746  NA 137 136 137 138 140  K 4.2 4.4 5.2* 5.4* 4.5  CL 103 100* 103 103 106  CO2 23 27 23 24 27   GLUCOSE 228* 150* 162* 171* 115*  BUN 58* 73* 76* 78* 63*  CREATININE 2.33* 2.14* 1.62* 1.60* 1.39*  CALCIUM 8.2* 8.3* 8.2* 8.4* 8.2*   Liver Function Tests:  Recent Labs Lab 10/29/16 0654 10/30/16 0307  AST 22 21  ALT 12* 13*  ALKPHOS 43 35*  BILITOT 1.0 0.7  PROT 6.9 6.2*  ALBUMIN 3.5 2.9*   No results for input(s): LIPASE, AMYLASE in the last 168 hours. No results for input(s): AMMONIA in the last 168 hours. CBC:  Recent Labs Lab 10/29/16 0654 10/30/16 0307 10/31/16 0320 11/01/16 0609  WBC 7.2 5.8 8.7 4.9  NEUTROABS 6.4  --   --   --   HGB 8.3* 6.8* 8.6* 7.9*  HCT 25.2* 21.2* 27.1* 24.3*  MCV 69.6* 69.5* 71.1* 71.5*  PLT 59* 55* 59* 52*   Cardiac Enzymes:  Recent Labs Lab 10/29/16 0706 10/29/16 1422 10/29/16 1921 10/29/16 2247  TROPONINI <0.03 0.05* 0.05* 0.04*   BNP: BNP (last 3 results)  Recent Labs  10/29/16 0706  BNP 56.8    ProBNP (last 3 results) No results for input(s): PROBNP in the last 8760 hours.  CBG:  Recent Labs Lab 10/29/16 1627  GLUCAP 155*       SignedDomenic Polite MD.  [facility] 11/02/2016, 11:25 AM

## 2016-11-02 NOTE — Progress Notes (Signed)
Discharged to home with family office visits in place teaching done  

## 2016-11-02 NOTE — Care Management Note (Signed)
Case Management Note  Patient Details  Name: [name] Castiglia, MD MRN: [id number] Date of Birth: [birth year]  Subjective/Objective:  83 y.o. Admitted with acute on Chronic Resp Fx to be discharged today with HHPT provided by [facility], and continued O2 therapy with continued service by [facility]. Jermaine, the rep for that agency is aware.                   Action/Plan:CM will sign off for now but will be available should additional discharge needs arise or disposition change.    Expected Discharge Date:  11/02/16               Expected Discharge Plan:  [place]  In-House Referral:  NA  Discharge planning Services  CM Consult  Post Acute Care Choice:  Durable Medical Equipment, Home Health Choice offered to:  Patient, Adult Children  DME Arranged:  Oxygen DME Agency:  [place]:  PT [facility] Agency:  [place]  Status of Service:  Completed, signed off  If discussed at [place] of Stay Meetings, dates discussed:    Additional Comments:  Delrae Sawyers, RN 11/02/2016, 11:38 AM

## 2016-11-03 LAB — CULTURE, BLOOD (ROUTINE X 2)
CULTURE: NO GROWTH
CULTURE: NO GROWTH
CULTURE: NO GROWTH
Culture: NO GROWTH
SPECIAL REQUESTS: ADEQUATE
Special Requests: ADEQUATE
Special Requests: ADEQUATE
Special Requests: ADEQUATE

## 2016-11-08 DIAGNOSIS — I2510 Atherosclerotic heart disease of native coronary artery without angina pectoris: Secondary | ICD-10-CM | POA: Diagnosis not present

## 2016-11-08 DIAGNOSIS — J441 Chronic obstructive pulmonary disease with (acute) exacerbation: Secondary | ICD-10-CM | POA: Diagnosis not present

## 2016-11-08 DIAGNOSIS — Z7902 Long term (current) use of antithrombotics/antiplatelets: Secondary | ICD-10-CM | POA: Diagnosis not present

## 2016-11-08 DIAGNOSIS — K5790 Diverticulosis of intestine, part unspecified, without perforation or abscess without bleeding: Secondary | ICD-10-CM | POA: Diagnosis not present

## 2016-11-08 DIAGNOSIS — R1310 Dysphagia, unspecified: Secondary | ICD-10-CM | POA: Diagnosis not present

## 2016-11-08 DIAGNOSIS — D631 Anemia in chronic kidney disease: Secondary | ICD-10-CM | POA: Diagnosis not present

## 2016-11-08 DIAGNOSIS — I130 Hypertensive heart and chronic kidney disease with heart failure and stage 1 through stage 4 chronic kidney disease, or unspecified chronic kidney disease: Secondary | ICD-10-CM | POA: Diagnosis not present

## 2016-11-08 DIAGNOSIS — E1122 Type 2 diabetes mellitus with diabetic chronic kidney disease: Secondary | ICD-10-CM | POA: Diagnosis not present

## 2016-11-08 DIAGNOSIS — Z95810 Presence of automatic (implantable) cardiac defibrillator: Secondary | ICD-10-CM | POA: Diagnosis not present

## 2016-11-08 DIAGNOSIS — R339 Retention of urine, unspecified: Secondary | ICD-10-CM | POA: Diagnosis not present

## 2016-11-08 DIAGNOSIS — Z955 Presence of coronary angioplasty implant and graft: Secondary | ICD-10-CM | POA: Diagnosis not present

## 2016-11-08 DIAGNOSIS — I5022 Chronic systolic (congestive) heart failure: Secondary | ICD-10-CM | POA: Diagnosis not present

## 2016-11-08 DIAGNOSIS — J189 Pneumonia, unspecified organism: Secondary | ICD-10-CM | POA: Diagnosis not present

## 2016-11-08 DIAGNOSIS — Z87891 Personal history of nicotine dependence: Secondary | ICD-10-CM | POA: Diagnosis not present

## 2016-11-08 DIAGNOSIS — E785 Hyperlipidemia, unspecified: Secondary | ICD-10-CM | POA: Diagnosis not present

## 2016-11-08 DIAGNOSIS — I255 Ischemic cardiomyopathy: Secondary | ICD-10-CM | POA: Diagnosis not present

## 2016-11-08 DIAGNOSIS — N183 Chronic kidney disease, stage 3 (moderate): Secondary | ICD-10-CM | POA: Diagnosis not present

## 2016-11-08 DIAGNOSIS — J440 Chronic obstructive pulmonary disease with acute lower respiratory infection: Secondary | ICD-10-CM | POA: Diagnosis not present

## 2016-11-08 DIAGNOSIS — Z7984 Long term (current) use of oral hypoglycemic drugs: Secondary | ICD-10-CM | POA: Diagnosis not present

## 2016-11-08 DIAGNOSIS — Z9981 Dependence on supplemental oxygen: Secondary | ICD-10-CM | POA: Diagnosis not present

## 2016-11-08 DIAGNOSIS — Z8674 Personal history of sudden cardiac arrest: Secondary | ICD-10-CM | POA: Diagnosis not present

## 2016-11-10 ENCOUNTER — Ambulatory Visit (INDEPENDENT_AMBULATORY_CARE_PROVIDER_SITE_OTHER): Payer: Medicare Other | Admitting: Pulmonary Disease

## 2016-11-10 ENCOUNTER — Encounter: Payer: Self-pay | Admitting: Pulmonary Disease

## 2016-11-10 ENCOUNTER — Inpatient Hospital Stay (HOSPITAL_COMMUNITY)
Admission: AD | Admit: 2016-11-10 | Discharge: 2016-11-14 | DRG: 190 | Disposition: A | Discharge status: Home Health | Payer: Medicare Other | Source: Ambulatory Visit | Attending: Pulmonary Disease | Admitting: Pulmonary Disease

## 2016-11-10 ENCOUNTER — Inpatient Hospital Stay (HOSPITAL_COMMUNITY): Payer: Medicare Other

## 2016-11-10 ENCOUNTER — Encounter (HOSPITAL_COMMUNITY): Payer: Self-pay

## 2016-11-10 VITALS — BP 124/76 | HR 81 | Ht 72.0 in | Wt 150.0 lb

## 2016-11-10 DIAGNOSIS — R0982 Postnasal drip: Secondary | ICD-10-CM

## 2016-11-10 DIAGNOSIS — E785 Hyperlipidemia, unspecified: Secondary | ICD-10-CM | POA: Diagnosis present

## 2016-11-10 DIAGNOSIS — K219 Gastro-esophageal reflux disease without esophagitis: Secondary | ICD-10-CM | POA: Diagnosis present

## 2016-11-10 DIAGNOSIS — E43 Unspecified severe protein-calorie malnutrition: Secondary | ICD-10-CM | POA: Diagnosis present

## 2016-11-10 DIAGNOSIS — E1122 Type 2 diabetes mellitus with diabetic chronic kidney disease: Secondary | ICD-10-CM | POA: Diagnosis present

## 2016-11-10 DIAGNOSIS — D563 Thalassemia minor: Secondary | ICD-10-CM | POA: Diagnosis present

## 2016-11-10 DIAGNOSIS — I5032 Chronic diastolic (congestive) heart failure: Secondary | ICD-10-CM | POA: Diagnosis present

## 2016-11-10 DIAGNOSIS — Z79899 Other long term (current) drug therapy: Secondary | ICD-10-CM

## 2016-11-10 DIAGNOSIS — Z7982 Long term (current) use of aspirin: Secondary | ICD-10-CM

## 2016-11-10 DIAGNOSIS — J449 Chronic obstructive pulmonary disease, unspecified: Secondary | ICD-10-CM | POA: Diagnosis not present

## 2016-11-10 DIAGNOSIS — I252 Old myocardial infarction: Secondary | ICD-10-CM

## 2016-11-10 DIAGNOSIS — Z7902 Long term (current) use of antithrombotics/antiplatelets: Secondary | ICD-10-CM

## 2016-11-10 DIAGNOSIS — H3530 Unspecified macular degeneration: Secondary | ICD-10-CM | POA: Diagnosis present

## 2016-11-10 DIAGNOSIS — I255 Ischemic cardiomyopathy: Secondary | ICD-10-CM | POA: Diagnosis present

## 2016-11-10 DIAGNOSIS — D509 Iron deficiency anemia, unspecified: Secondary | ICD-10-CM | POA: Diagnosis present

## 2016-11-10 DIAGNOSIS — I130 Hypertensive heart and chronic kidney disease with heart failure and stage 1 through stage 4 chronic kidney disease, or unspecified chronic kidney disease: Secondary | ICD-10-CM | POA: Diagnosis present

## 2016-11-10 DIAGNOSIS — K5900 Constipation, unspecified: Secondary | ICD-10-CM | POA: Diagnosis present

## 2016-11-10 DIAGNOSIS — J441 Chronic obstructive pulmonary disease with (acute) exacerbation: Principal | ICD-10-CM | POA: Diagnosis present

## 2016-11-10 DIAGNOSIS — Z87891 Personal history of nicotine dependence: Secondary | ICD-10-CM

## 2016-11-10 DIAGNOSIS — Z95810 Presence of automatic (implantable) cardiac defibrillator: Secondary | ICD-10-CM | POA: Diagnosis not present

## 2016-11-10 DIAGNOSIS — R5381 Other malaise: Secondary | ICD-10-CM | POA: Diagnosis not present

## 2016-11-10 DIAGNOSIS — J9621 Acute and chronic respiratory failure with hypoxia: Secondary | ICD-10-CM | POA: Diagnosis not present

## 2016-11-10 DIAGNOSIS — D61818 Other pancytopenia: Secondary | ICD-10-CM | POA: Diagnosis not present

## 2016-11-10 DIAGNOSIS — N400 Enlarged prostate without lower urinary tract symptoms: Secondary | ICD-10-CM | POA: Diagnosis present

## 2016-11-10 DIAGNOSIS — D472 Monoclonal gammopathy: Secondary | ICD-10-CM | POA: Diagnosis present

## 2016-11-10 DIAGNOSIS — D696 Thrombocytopenia, unspecified: Secondary | ICD-10-CM

## 2016-11-10 DIAGNOSIS — Z8249 Family history of ischemic heart disease and other diseases of the circulatory system: Secondary | ICD-10-CM

## 2016-11-10 DIAGNOSIS — I2510 Atherosclerotic heart disease of native coronary artery without angina pectoris: Secondary | ICD-10-CM | POA: Diagnosis present

## 2016-11-10 DIAGNOSIS — R0600 Dyspnea, unspecified: Secondary | ICD-10-CM | POA: Diagnosis not present

## 2016-11-10 DIAGNOSIS — Z832 Family history of diseases of the blood and blood-forming organs and certain disorders involving the immune mechanism: Secondary | ICD-10-CM | POA: Diagnosis not present

## 2016-11-10 DIAGNOSIS — E538 Deficiency of other specified B group vitamins: Secondary | ICD-10-CM | POA: Diagnosis not present

## 2016-11-10 DIAGNOSIS — J189 Pneumonia, unspecified organism: Secondary | ICD-10-CM | POA: Diagnosis not present

## 2016-11-10 DIAGNOSIS — Z7951 Long term (current) use of inhaled steroids: Secondary | ICD-10-CM

## 2016-11-10 DIAGNOSIS — N189 Chronic kidney disease, unspecified: Secondary | ICD-10-CM | POA: Diagnosis present

## 2016-11-10 DIAGNOSIS — J440 Chronic obstructive pulmonary disease with acute lower respiratory infection: Secondary | ICD-10-CM | POA: Diagnosis not present

## 2016-11-10 DIAGNOSIS — D72819 Decreased white blood cell count, unspecified: Secondary | ICD-10-CM | POA: Diagnosis not present

## 2016-11-10 LAB — CBC WITH DIFFERENTIAL/PLATELET
BASOS PCT: 0 %
Basophils Absolute: 0 10*3/uL (ref 0.0–0.1)
EOS PCT: 0 %
Eosinophils Absolute: 0 10*3/uL (ref 0.0–0.7)
HEMATOCRIT: 27.6 % — AB (ref 39.0–52.0)
HEMOGLOBIN: 9 g/dL — AB (ref 13.0–17.0)
Lymphocytes Relative: 15 %
Lymphs Abs: 0.7 10*3/uL (ref 0.7–4.0)
MCH: 23.3 pg — AB (ref 26.0–34.0)
MCHC: 32.6 g/dL (ref 30.0–36.0)
MCV: 71.5 fL — AB (ref 78.0–100.0)
MONO ABS: 0.2 10*3/uL (ref 0.1–1.0)
MONOS PCT: 5 %
Neutro Abs: 3.5 10*3/uL (ref 1.7–7.7)
Neutrophils Relative %: 80 %
PLATELETS: 56 10*3/uL — AB (ref 150–400)
RBC: 3.86 MIL/uL — ABNORMAL LOW (ref 4.22–5.81)
RDW: 19.4 % — ABNORMAL HIGH (ref 11.5–15.5)
WBC: 4.4 10*3/uL (ref 4.0–10.5)

## 2016-11-10 LAB — BRAIN NATRIURETIC PEPTIDE: B Natriuretic Peptide: 91.3 pg/mL (ref 0.0–100.0)

## 2016-11-10 LAB — LACTIC ACID, PLASMA
LACTIC ACID, VENOUS: 1.2 mmol/L (ref 0.5–1.9)
Lactic Acid, Venous: 0.9 mmol/L (ref 0.5–1.9)

## 2016-11-10 LAB — COMPREHENSIVE METABOLIC PANEL
ALK PHOS: 44 U/L (ref 38–126)
ALT: 23 U/L (ref 17–63)
ANION GAP: 9 (ref 5–15)
AST: 19 U/L (ref 15–41)
Albumin: 3.3 g/dL — ABNORMAL LOW (ref 3.5–5.0)
BILIRUBIN TOTAL: 0.5 mg/dL (ref 0.3–1.2)
BUN: 38 mg/dL — AB (ref 6–20)
CALCIUM: 8.4 mg/dL — AB (ref 8.9–10.3)
CO2: 26 mmol/L (ref 22–32)
Chloride: 104 mmol/L (ref 101–111)
Creatinine, Ser: 1.36 mg/dL — ABNORMAL HIGH (ref 0.61–1.24)
GFR calc Af Amer: 52 mL/min — ABNORMAL LOW (ref >60)
GFR, EST NON AFRICAN AMERICAN: 44 mL/min — AB (ref >60)
Glucose, Bld: 93 mg/dL (ref 65–99)
POTASSIUM: 4.7 mmol/L (ref 3.5–5.1)
Sodium: 139 mmol/L (ref 135–145)
TOTAL PROTEIN: 6.2 g/dL — AB (ref 6.5–8.1)

## 2016-11-10 LAB — TROPONIN I: TROPONIN I: 0.13 ng/mL — AB (ref <0.03)

## 2016-11-10 LAB — MRSA PCR SCREENING: MRSA BY PCR: NEGATIVE

## 2016-11-10 LAB — TYPE AND SCREEN
ABO/RH(D): A POS
Antibody Screen: NEGATIVE

## 2016-11-10 LAB — MAGNESIUM: MAGNESIUM: 2.1 mg/dL (ref 1.7–2.4)

## 2016-11-10 LAB — ABO/RH: ABO/RH(D): A POS

## 2016-11-10 LAB — PHOSPHORUS: Phosphorus: 4.1 mg/dL (ref 2.5–4.6)

## 2016-11-10 LAB — PROCALCITONIN: PROCALCITONIN: 0.1 ng/mL

## 2016-11-10 MED ORDER — ASPIRIN EC 81 MG PO TBEC
81.0000 mg | DELAYED_RELEASE_TABLET | Freq: Every day | ORAL | Status: DC
  Administered 2016-11-10 – 2016-11-14 (×5): 81 mg via ORAL
  Filled 2016-11-10 (×5): qty 1

## 2016-11-10 MED ORDER — ARFORMOTEROL TARTRATE 15 MCG/2ML IN NEBU
15.0000 ug | INHALATION_SOLUTION | Freq: Two times a day (BID) | RESPIRATORY_TRACT | Status: DC
  Administered 2016-11-10 – 2016-11-14 (×8): 15 ug via RESPIRATORY_TRACT
  Filled 2016-11-10 (×9): qty 2

## 2016-11-10 MED ORDER — BUDESONIDE 0.5 MG/2ML IN SUSP
0.5000 mg | Freq: Two times a day (BID) | RESPIRATORY_TRACT | Status: DC
  Administered 2016-11-10 – 2016-11-14 (×8): 0.5 mg via RESPIRATORY_TRACT
  Filled 2016-11-10 (×9): qty 2

## 2016-11-10 MED ORDER — SODIUM CHLORIDE 0.9 % IV SOLN: 250.0000 mL | INTRAVENOUS | Status: DC | PRN

## 2016-11-10 MED ORDER — VANCOMYCIN HCL IN DEXTROSE 1-5 GM/200ML-% IV SOLN: 1000.0000 mg | INTRAVENOUS | Status: DC

## 2016-11-10 MED ORDER — METHYLPREDNISOLONE SODIUM SUCC 40 MG IJ SOLR
40.0000 mg | Freq: Two times a day (BID) | INTRAMUSCULAR | Status: DC
  Administered 2016-11-10 – 2016-11-13 (×6): 40 mg via INTRAVENOUS
  Filled 2016-11-10 (×6): qty 1

## 2016-11-10 MED ORDER — VANCOMYCIN HCL 10 G IV SOLR
1500.0000 mg | Freq: Once | INTRAVENOUS | Status: AC
  Administered 2016-11-10: 1500 mg via INTRAVENOUS
  Filled 2016-11-10: qty 1500

## 2016-11-10 MED ORDER — DM-GUAIFENESIN ER 30-600 MG PO TB12
1.0000 | ORAL_TABLET | Freq: Two times a day (BID) | ORAL | Status: DC
  Administered 2016-11-10 – 2016-11-14 (×8): 1 via ORAL
  Filled 2016-11-10 (×8): qty 1

## 2016-11-10 MED ORDER — SENNOSIDES-DOCUSATE SODIUM 8.6-50 MG PO TABS
1.0000 | ORAL_TABLET | Freq: Every day | ORAL | Status: DC
  Administered 2016-11-10 – 2016-11-13 (×4): 1 via ORAL
  Filled 2016-11-10 (×4): qty 1

## 2016-11-10 MED ORDER — ALBUTEROL SULFATE (2.5 MG/3ML) 0.083% IN NEBU
2.5000 mg | INHALATION_SOLUTION | RESPIRATORY_TRACT | Status: DC | PRN
  Administered 2016-11-11: 2.5 mg via RESPIRATORY_TRACT
  Filled 2016-11-10: qty 3

## 2016-11-10 MED ORDER — ROSUVASTATIN CALCIUM 5 MG PO TABS
5.0000 mg | ORAL_TABLET | Freq: Every day | ORAL | Status: DC
  Administered 2016-11-11 – 2016-11-13 (×3): 5 mg via ORAL
  Filled 2016-11-10 (×3): qty 1

## 2016-11-10 MED ORDER — CLOPIDOGREL BISULFATE 75 MG PO TABS
75.0000 mg | ORAL_TABLET | Freq: Every day | ORAL | Status: DC
  Administered 2016-11-11 – 2016-11-14 (×4): 75 mg via ORAL
  Filled 2016-11-10 (×4): qty 1

## 2016-11-10 MED ORDER — DEXTROSE 5 % IV SOLN
1.0000 g | Freq: Two times a day (BID) | INTRAVENOUS | Status: DC
  Administered 2016-11-10: 1 g via INTRAVENOUS
  Filled 2016-11-10 (×2): qty 1

## 2016-11-10 MED ORDER — METOPROLOL TARTRATE 12.5 MG HALF TABLET
12.5000 mg | ORAL_TABLET | Freq: Two times a day (BID) | ORAL | Status: DC
  Administered 2016-11-10 – 2016-11-14 (×8): 12.5 mg via ORAL
  Filled 2016-11-10 (×8): qty 1

## 2016-11-10 NOTE — Progress Notes (Signed)
eLink Physician-Brief Progress Note Patient Name: [name] Bailon, MD DOB: Nov 02, 1925 MRN: [id number]   Date of Service  11/10/2016  HPI/Events of Note  Troponin = 0.13 - Patient is already on ASA, Plavix and Metoprolol.   eICU Interventions  Will order: 1. 12 Lead EKG now. 2. Continue to trend Troponin,     Intervention Category Intermediate Interventions: Diagnostic test evaluation  Sommer,Steven Eugene 11/10/2016, 8:06 PM

## 2016-11-10 NOTE — Progress Notes (Signed)
Pt's daughter states that patient self-catheterizes at home approximately 5-6 times per day.    Will continue to monitor, and will make oncoming nurse aware.

## 2016-11-10 NOTE — Progress Notes (Signed)
Subjective:    Patient ID: [name] Azure, MD, male    DOB: 1925-07-02, 83 y.o.   MRN: [id number]  [facility]  Acute visit for Shortness of Breath w/ Mixed Type COPD & Chronic Hypoxic Respiratory Failure.  HPI He was admitted to hospital after his last visit and was found to have pneumonia. He was discharged on 10/7. He was treated with Rocephin & Azithromycin.   Shortness of breath:  This started about 2 days ago. His weight is continuing to decrease. His dyspnea seems to be progressively worsening.   Mixed Type COPD:  Previously prescribed Brovana and Pulmicort. He has been tapering down on Prednisone. He is using his rescue medication 3 times today already.   Chronic Hypoxic Respiratory Failure:  Prescribed oxygen at 2 L/m while sleeping & with exertion. He has had increased oxygen requirement lately since his discharge.   Review of Systems Patient has had significant constipation since discharge. Has had decreased oral intake. He did have chills yesterday but no fever or sweats. No chest pain or pressure. He does admit to anxiety with his dyspnea.   No Known Allergies  Current Outpatient Prescriptions on File Prior to Visit  Medication Sig Dispense Refill  . albuterol (PROVENTIL) (2.5 MG/3ML) 0.083% nebulizer solution Take 2.5 mg by nebulization every 4 (four) hours as needed for wheezing or shortness of breath.    . ALPRAZolam (XANAX) 0.25 MG tablet Take 0.25 mg by mouth at bedtime as needed for sleep.     Marland Kitchen arformoterol (BROVANA) 15 MCG/2ML NEBU Take 15 mcg by nebulization 2 (two) times daily.    Marland Kitchen aspirin EC 81 MG tablet Take 1 tablet (81 mg total) by mouth at bedtime. 30 tablet 0  . budesonide (PULMICORT) 0.5 MG/2ML nebulizer solution Take 2 mLs (0.5 mg total) by nebulization 2 (two) times daily. 120 mL 3  . cefpodoxime (VANTIN) 200 MG tablet Take 1 tablet (200 mg total) by mouth 2 (two) times daily. For 4days 8 tablet 0  . clopidogrel (PLAVIX) 75 MG tablet Take 1 tablet (75 mg  total) by mouth daily. 90 tablet 1  . docusate sodium (COLACE) 100 MG capsule Take 100 mg by mouth 2 (two) times daily as needed for mild constipation.    Marland Kitchen guaiFENesin (MUCINEX) 600 MG 12 hr tablet Take 600 mg by mouth 2 (two) times daily as needed for cough or to loosen phlegm.     . metoprolol tartrate (LOPRESSOR) 25 MG tablet Take 12.5 mg by mouth 2 (two) times daily. Reported on 06/06/2015    . nitroGLYCERIN (NITROSTAT) 0.4 MG SL tablet Place 1 tablet (0.4 mg total) under the tongue every 5 (five) minutes as needed for chest pain. UP TO 3 DOSES THEN CALL EMS 30 tablet 1  . OXYGEN Inhale 2 L into the lungs at bedtime.     . predniSONE (DELTASONE) 20 MG tablet Take 1-2 tablets (20-40 mg total) by mouth daily with breakfast. Take 40mg  for 2days then 20mg  for 2days then STOP 6 tablet 0  . rosuvastatin (CRESTOR) 5 MG tablet Take 1 tablet (5 mg total) by mouth daily at 6 PM. 90 tablet 3  . trimethoprim (TRIMPEX) 100 MG tablet Take 100 mg by mouth daily.     No current facility-administered medications on file prior to visit.     Past Medical History:  Diagnosis Date  . Anemia, unspecified   . CAD (coronary artery disease)   . Cataracts, bilateral   . CHF (congestive heart failure) ([place])   .  Chronic airway obstruction, not elsewhere classified    on o2 at night  . Diabetes mellitus   . Diverticulosis of colon (without mention of hemorrhage)   . Dyslipidemia   . Emphysema   . Hemorrhoids   . ICD (implantable cardiac defibrillator) in place    st jude  . Ischemic cardiomyopathy   . Macular degeneration   . MI, old 2010 or 2011  . Prostatic hypertrophy    hx of  . Retention of urine, unspecified   . Unspecified disorder resulting from impaired renal function     Past Surgical History:  Procedure Laterality Date  . CARDIAC DEFIBRILLATOR PLACEMENT  2011   st jude  . CATARACT EXTRACTION    . PACEMAKER INSERTION  2011  . pci     4/11  . TONSILLECTOMY  83 yo    Family History    Problem Relation Age of Onset  . Coronary artery disease Mother   . Stroke Mother   . Congestive Heart Failure Mother   . Lung cancer Father        smoker  . Breast cancer Sister   . Atrial fibrillation Daughter   . Heart attack Brother     Social History   Social History  . Marital status: Married    Spouse name: N/A  . Number of children: N/A  . 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 Smoking status: Former Smoker    Types: Cigarettes    Quit date: 04/27/2009  . Smokeless tobacco: Never Used  . Alcohol use Yes     Comment: occasional beer  . Drug use: No  . Sexual activity: No   Other Topics Concern  . None   Social History Narrative   Lives in a house with stairs, which he needs to use, with his wife.  Wife is not able-bodied.  Does not use a cane or walker, but is unsteady on his feet.        [name] [place]:  [phone] cell, [phone], daughter, POA.     Boyd Kerbs:  [phone], nephew   Juluis Pitch:  [phone], daughter               Objective:   Physical Exam BP 124/76 (BP Location: Right Arm, Cuff Size: Normal)   Pulse 81   Ht 6' (1.829 m)   Wt 150 lb (68 kg)   SpO2 91%   BMI 20.34 kg/m  General:  Awake. Alert. No acute distress. Sitting in scooter. Daughter present.  Integument:  Warm. Dry. No rash on exposed skin. Extremities:  No cyanosis or clubbing.  Lymphatics:  No appreciated cervical or supraclavicular lymphadenoapthy. HEENT:  Dry mucus membranes. No scleral injection or icterus.  Cardiovascular:  Regular rate. Trace bilateral lower extremity edema. Unable to appreciate JVD.  Pulmonary:  Symmetrically decreased breath sounds. Mild to moderately increased work of breathing on previous per minute via nasal cannula. Abdomen: Soft. Normal bowel sounds. Protuberant. Musculoskeletal:  Normal bulk and tone. No joint deformity or effusion appreciated. Neurological:   CN 2-12 grossly in tact. Very hard of hearing. No meningismus. Moving all 4 extremities equally. Symmetric brachioradialis deep tendon reflexes. Psychiatric:  Mood and affect congruent.    IMAGING PORT CXR 10/29/16 (personally reviewed by me):  Hyperinflation suggested by flattening of the diaphragms. Left upper lung peripheral patchy opacification. No pleural effusion appreciated. Implanted device within left anterior chest.  MICROBIOLOGY  Respiratory Viral Panel PCR 10/30/16:  Negative  Urine Streptococcal Antigen 10/29/16:  Negative  Blood Cultures x4 10/29/16:  Negative     Assessment & Plan:  83 y.o. male recently hospitalized for left upper lobe pneumonia. Returns today for acute on chronic hypoxic respiratory failure with worsening oxygenation over the last 48 hours. Patient has been adherent to his nebulizer regimen and I detect no wheezing on exam today raising concern for a new infectious process. Certainly, a healthcare associated pneumonia is possible.  1. Acute on chronic hypoxic respiratory failure: Checking serum workup with BNP & CBC with differential along with a stat portable CXR. 2. Left upper lobe pneumonia: Recently treated with Rocephin and azithromycin. Starting broad-spectrum antibiotics with vancomycin and Fortaz after blood cultures are obtained. Further infectious workup with urine Legionella antigen, urine streptococcal antigen, and portable chest x-ray imaging. 3. Disposition: Admitting patient directly to stepdown unit. Further plan of care as per admission H and P.  Maleeka Sabatino E. Ashok Cordia, M.D. [facility] Pager:  [phone] After 7pm or if no response, call [phone] 4:26 PM 11/10/16

## 2016-11-10 NOTE — H&P (Signed)
[facility] Attending Consult  Date of Admit:  11/10/16  Reason for Consult/Chief Complaint:  Acute on Chronic Hypoxic Respiratory Failure  History of Presenting Illness:  83 y.o. male who follows in our office. Patient seen today as an acute visit. He was admitted to hospital 10/3 through 10/7 with a left upper lobe pneumonia and acute on chronic hypoxic respiratory failure. He did undergo transfusion during that admission for anemia and was also treated for a COPD exacerbation. Patient was discharged on oral antibiotic therapy as well as a prednisone taper. Patient and daughter have noticed significantly worsening dyspnea over the last 48 hours with increasing oxygen requirement. Patient has had continued weight loss since discharge from hospital with decreasing appetite. Patient also has notably had constipation since discharge. Patient has continued utilizing his Brovana and Pulmicort twice daily as prescribed but has hadn't required his rescue inhaler/nebulizer medication 3 times today already. Previously the patient was primarily using oxygen while sleeping and with significant exertion but today he is requiring 3-4 L/m to maintain his saturation above 90%. Patient denies any abdominal pain or nausea. He denies any chest pain or pressure. Patient does endorse some anxiety with his dyspnea. Denies any fever or sweats but did experience chills yesterday.  Review of Systems: No rashes or abnormal bruising. No dysuria or hematuria. A pertinent 14 point review of systems is negative except as per the history of presenting illness.  No Known Allergies  No current facility-administered medications on file prior to encounter.    Current Outpatient Prescriptions on File Prior to Encounter  Medication Sig Dispense Refill  . albuterol (PROVENTIL) (2.5 MG/3ML) 0.083% nebulizer solution Take 2.5 mg by nebulization every 4 (four) hours as needed for wheezing or shortness of breath.     . ALPRAZolam (XANAX) 0.25 MG tablet Take 0.25 mg by mouth at bedtime as needed for sleep.     Marland Kitchen arformoterol (BROVANA) 15 MCG/2ML NEBU Take 15 mcg by nebulization 2 (two) times daily.    Marland Kitchen aspirin EC 81 MG tablet Take 1 tablet (81 mg total) by mouth at bedtime. 30 tablet 0  . budesonide (PULMICORT) 0.5 MG/2ML nebulizer solution Take 2 mLs (0.5 mg total) by nebulization 2 (two) times daily. 120 mL 3  . cefpodoxime (VANTIN) 200 MG tablet Take 1 tablet (200 mg total) by mouth 2 (two) times daily. For 4days 8 tablet 0  . clopidogrel (PLAVIX) 75 MG tablet Take 1 tablet (75 mg total) by mouth daily. 90 tablet 1  . docusate sodium (COLACE) 100 MG capsule Take 100 mg by mouth 2 (two) times daily as needed for mild constipation.    Marland Kitchen guaiFENesin (MUCINEX) 600 MG 12 hr tablet Take 600 mg by mouth 2 (two) times daily as needed for cough or to loosen phlegm.     . metoprolol tartrate (LOPRESSOR) 25 MG tablet Take 12.5 mg by mouth 2 (two) times daily. Reported on 06/06/2015    . nitroGLYCERIN (NITROSTAT) 0.4 MG SL tablet Place 1 tablet (0.4 mg total) under the tongue every 5 (five) minutes as needed for chest pain. UP TO 3 DOSES THEN CALL EMS 30 tablet 1  . OXYGEN Inhale 2 L into the lungs at bedtime.     . predniSONE (DELTASONE) 20 MG tablet Take 1-2 tablets (20-40 mg total) by mouth daily with breakfast. Take 86m for 2days then 213mfor 2days then STOP 6 tablet 0  . rosuvastatin (CRESTOR) 5 MG tablet Take 1 tablet (5 mg total)  by mouth daily at 6 PM. 90 tablet 3  . trimethoprim (TRIMPEX) 100 MG tablet Take 100 mg by mouth daily.      Past Medical History:  Diagnosis Date  . Anemia, unspecified   . CAD (coronary artery disease)   . Cataracts, bilateral   . CHF (congestive heart failure) ([place])   . Chronic airway obstruction, not elsewhere classified    on o2 at night  . Diabetes mellitus   . Diverticulosis of colon (without mention of hemorrhage)   . Dyslipidemia   . Emphysema   . Hemorrhoids   .  ICD (implantable cardiac defibrillator) in place    [facility]  . Ischemic cardiomyopathy   . Macular degeneration   . MI, old 2010 or 2011  . Prostatic hypertrophy    hx of  . Retention of urine, unspecified   . Unspecified disorder resulting from impaired renal function     Past Surgical History:  Procedure Laterality Date  . CARDIAC DEFIBRILLATOR PLACEMENT  2011   [facility]  . CATARACT EXTRACTION    . PACEMAKER INSERTION  2011  . pci     4/11  . TONSILLECTOMY  83 yo    Family History  Problem Relation Age of Onset  . Coronary artery disease Mother   . Stroke Mother   . Congestive Heart Failure Mother   . Lung cancer Father        smoker  . Breast cancer Sister   . Atrial fibrillation Daughter   . Heart attack Brother     Social History   Social History  . Marital status: Married    Spouse name: N/A  . Number of children: N/A  . 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 Smoking status: Former Smoker    Types: Cigarettes    Quit date: 04/27/2009  . Smokeless tobacco: Never Used  . Alcohol use Yes     Comment: occasional beer  . Drug use: No  . Sexual activity: No   Other Topics Concern  . Not on file   Social History Narrative   Lives in a house with stairs, which he needs to use, with his wife.  Wife is not able-bodied.  Does not use a cane or walker, but is unsteady on his feet.        [name] Morfin:  [phone] cell, [phone], daughter, POA.     Boyd Kerbs:  [phone], nephew   Juluis Pitch:  [phone], daughter             Pulse Rate:  [81] 81 (10/15 1549) BP: (124)/(76) 124/76 (10/15 1549) SpO2:  [91 %] 91 % (10/15 1549) Weight:  [150 lb (68 kg)] 150 lb (68 kg) (10/15 1549) On 3-4 L/m  General:  Awake. Alert. No acute distress. Sitting in scooter. Daughter present.  Integument:  Warm. Dry. No rash on exposed skin. Extremities:  No cyanosis or clubbing.   Lymphatics:  No appreciated cervical or supraclavicular lymphadenoapthy. HEENT:  Dry mucus membranes. No scleral injection or icterus.  Cardiovascular:  Regular rate. Trace bilateral lower extremity edema. Unable to appreciate JVD.  Pulmonary:  Symmetrically decreased breath sounds. Mild to moderately increased work of breathing on previous per minute via nasal cannula. Abdomen: Soft. Normal bowel sounds. Protuberant. Musculoskeletal:  Normal bulk and tone. No joint deformity or effusion appreciated. Neurological:  CN 2-12 grossly in tact. Very hard of hearing. No  meningismus. Moving all 4 extremities equally. Symmetric brachioradialis deep tendon reflexes. Psychiatric:  Mood and affect congruent.    LINES/TUBES: PIV  CBC Latest Ref Rng & Units 11/01/2016 10/31/2016 10/30/2016  WBC 4.0 - 10.5 K/uL 4.9 8.7 5.8  Hemoglobin 13.0 - 17.0 g/dL 7.9(L) 8.6(L) 6.8(LL)  Hematocrit 39.0 - 52.0 % 24.3(L) 27.1(L) 21.2(L)  Platelets 150 - 400 K/uL 52(L) 59(L) 55(L)    BMP Latest Ref Rng & Units 11/02/2016 11/01/2016 11/01/2016  Glucose 65 - 99 mg/dL 115(H) 171(H) 162(H)  BUN 6 - 20 mg/dL 63(H) 78(H) 76(H)  Creatinine 0.61 - 1.24 mg/dL 1.39(H) 1.60(H) 1.62(H)  Sodium 135 - 145 mmol/L 140 138 137  Potassium 3.5 - 5.1 mmol/L 4.5 5.4(H) 5.2(H)  Chloride 101 - 111 mmol/L 106 103 103  CO2 22 - 32 mmol/L _0 Calcium 8.9 - 10.3 mg/dL 8.2(L) 8.4(L) 8.2(L)    Hepatic Function Latest Ref Rng & Units 10/30/2016 10/29/2016 06/18/2015  Total Protein 6.5 - 8.1 g/dL 6.2(L) 6.9 5.2(L)  Albumin 3.5 - 5.0 g/dL 2.9(L) 3.5 2.7(L)  AST 15 - 41 U/L 21 22 14(L)  ALT 17 - 63 U/L 13(L) 12(L) 30  Alk Phosphatase 38 - 126 U/L 35(L) 43 31(L)  Total Bilirubin 0.3 - 1.2 mg/dL 0.7 1.0 0.6    IMAGING/STUDIES: PORT CXR 10/29/16 (personally reviewed by me):  Hyperinflation suggested by flattening of the diaphragms. Left upper lung peripheral patchy opacification. No pleural effusion appreciated. Implanted device within left  anterior chest.  MICROBIOLOGY: Respiratory Viral Panel PCR 10/30/16:  Negative  Urine Streptococcal Antigen 10/29/16:  Negative  Blood Cultures x4 10/29/16:  Negative   ANTIBIOTICS: Vancomycin 10/15 >>> Tressie Ellis 10/15 >>>  SIGNIFICANT EVENTS: 10/3 - 10/7 - Admit with Left upper lobe CAP & acute on chronic hypoxic respiratory failure 10/15 - Admit  ASSESSMENT/PLAN:  83 y.o. male with acute on chronic hypoxic respiratory failure. Patient recently admitted with a left upper lobe pneumonia. Given his clinical deterioration over the last 48 hours I do question whether or not this could be due to a healthcare associated pneumonia. There are no other sources of infection from the history that I can discern. It certainly possible that the anemia is complicating his picture of dyspnea and hypoxia. However, the patient has no symptoms that would suggest acute blood loss anemia and is currently only on Plavix. After lengthy discussion with the patient and his daughter I feel that the safest thing would be to admit the patient to continue workup as I feel his clinical trajectory is one that has a high potential to worsen in the next 24-48 hours. Both patient and daughter are in agreement with direct admission.  1. Acute on chronic hypoxic respiratory failure: Continuing supplemental oxygen to maintain saturation greater than 90%. Checking serum BNP & CBC. Checking stat portable chest x-ray on admission. Continuous pulse oximetry monitoring. 2. Left upper lobe pneumonia: Recently treated with Rocephin & azithromycin followed by outpatient Vantin. Checking an trending Procalcitonin. Checking blood cultures, urine Legionella antigen, and urine streptococcal antigen stat. Initiating empiric treatment with vancomycin and Fortaz. Checking stat portable chest x-ray upon admission. 3. Anemia: No signs of active blood loss. Ordering stat type and screen upon admission as well as CBC with differential. 4. Mixed type COPD:  Recent exacerbation. Continuing Brovana and Pulmicort nebulized twice daily. Continuing albuterol nebulized as needed. Starting Solu-Medrol 40 mg IV every 12 hours. 5. History of congestive heart failure: No clear signs of acute exacerbation. Checking BNP. Checking  stat portable chest x-ray. 6. Coronary artery disease: Continuing home Aspirin, Plavix, Lopressor, and Crestor. Continuous telemetry monitoring. 7. History of diabetes mellitus: Not currently on medication. Monitoring glucose with daily labs. 8. History of chronic renal failure: Checking electrolytes and renal function stat.  Prophylaxis:  SCDs. Diet:  NPO except meds with sips. Code Status:  Full Code per daughter. Patient has Living Will. Disposition:  Admitting to [facility]. Family Update:  Daughter & Patient updated at the time of my exam.   Sonia Baller. Ashok Cordia, M.D. [facility] Pager:  [phone] After 7pm or if no response, call [phone] 4:47 PM 11/10/16

## 2016-11-10 NOTE — Patient Instructions (Signed)
Admitting patient directly to Hospital for workup.

## 2016-11-10 NOTE — Progress Notes (Signed)
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  Pharmacy has been consulted for vancomycin and ceftazidime dosing. Patient recently admitted for CAP receiving ceftriaxone and azithromycin then po cefpodoxime.  Presents to f/u appt with respiratory failure.   Baseline PCT = 0.1  Plan:  Vancomycin 1500mg  IV x 1 then 1gm q24h  Trough if remains on vancomycin   Ceftazidime 1gm q12h  Monitor renal function  Narrow with culture results and procalcitonin results  Height: 6' (182.9 cm) Weight: 153 lb 10.6 oz (69.7 kg) IBW/kg (Calculated) : 77.6  No data recorded.  No results for input(s): WBC, CREATININE, LATICACIDVEN, VANCOTROUGH, VANCOPEAK, VANCORANDOM, GENTTROUGH, GENTPEAK, GENTRANDOM, TOBRATROUGH, TOBRAPEAK, TOBRARND, AMIKACINPEAK, AMIKACINTROU, AMIKACIN in the last 168 hours.  Estimated Creatinine Clearance: 35.5 mL/min (A) (by C-G formula based on SCr of 1.39 mg/dL (H)).    No Known Allergies  Antimicrobials this admission: 10/15 vancomycin >> 1/15 cefepime >>  Dose adjustments this admission:   Microbiology results: 10/15 Bcx: 10/15 strep Ag 10/15 Legionella Ag:   Thank you for allowing pharmacy to be a part of this patient's care.  Doreene Eland, PharmD, BCPS.   Pager: [phone] 11/10/2016 6:12 PM

## 2016-11-10 NOTE — Progress Notes (Signed)
CRITICAL VALUE ALERT  Critical Value:  Troponin 0.13  Date & Time Notied: 9396, 11/10/2016  Provider Notified: Warren Lacy  Orders Received/Actions taken: N/A

## 2016-11-11 DIAGNOSIS — J449 Chronic obstructive pulmonary disease, unspecified: Secondary | ICD-10-CM

## 2016-11-11 DIAGNOSIS — D696 Thrombocytopenia, unspecified: Secondary | ICD-10-CM

## 2016-11-11 DIAGNOSIS — D61818 Other pancytopenia: Secondary | ICD-10-CM

## 2016-11-11 DIAGNOSIS — D72819 Decreased white blood cell count, unspecified: Secondary | ICD-10-CM

## 2016-11-11 DIAGNOSIS — E538 Deficiency of other specified B group vitamins: Secondary | ICD-10-CM

## 2016-11-11 DIAGNOSIS — Z832 Family history of diseases of the blood and blood-forming organs and certain disorders involving the immune mechanism: Secondary | ICD-10-CM

## 2016-11-11 LAB — BASIC METABOLIC PANEL
Anion gap: 8 (ref 5–15)
BUN: 37 mg/dL — AB (ref 6–20)
CO2: 23 mmol/L (ref 22–32)
Calcium: 8.2 mg/dL — ABNORMAL LOW (ref 8.9–10.3)
Chloride: 105 mmol/L (ref 101–111)
Creatinine, Ser: 1.2 mg/dL (ref 0.61–1.24)
GFR calc non Af Amer: 52 mL/min — ABNORMAL LOW (ref >60)
GFR, EST AFRICAN AMERICAN: 60 mL/min — AB (ref >60)
Glucose, Bld: 120 mg/dL — ABNORMAL HIGH (ref 65–99)
POTASSIUM: 4.9 mmol/L (ref 3.5–5.1)
SODIUM: 136 mmol/L (ref 135–145)

## 2016-11-11 LAB — CBC
HCT: 24.9 % — ABNORMAL LOW (ref 39.0–52.0)
Hemoglobin: 8.2 g/dL — ABNORMAL LOW (ref 13.0–17.0)
MCH: 23.6 pg — AB (ref 26.0–34.0)
MCHC: 32.9 g/dL (ref 30.0–36.0)
MCV: 71.8 fL — ABNORMAL LOW (ref 78.0–100.0)
PLATELETS: 45 10*3/uL — AB (ref 150–400)
RBC: 3.47 MIL/uL — AB (ref 4.22–5.81)
RDW: 19.4 % — ABNORMAL HIGH (ref 11.5–15.5)
WBC: 2.8 10*3/uL — AB (ref 4.0–10.5)

## 2016-11-11 LAB — LACTATE DEHYDROGENASE: LDH: 184 U/L (ref 98–192)

## 2016-11-11 LAB — TROPONIN I
TROPONIN I: 0.1 ng/mL — AB (ref <0.03)
TROPONIN I: 0.07 ng/mL — AB (ref <0.03)

## 2016-11-11 LAB — STREP PNEUMONIAE URINARY ANTIGEN: STREP PNEUMO URINARY ANTIGEN: NEGATIVE

## 2016-11-11 LAB — PHOSPHORUS: PHOSPHORUS: 4.9 mg/dL — AB (ref 2.5–4.6)

## 2016-11-11 LAB — MAGNESIUM: MAGNESIUM: 2.1 mg/dL (ref 1.7–2.4)

## 2016-11-11 LAB — SAVE SMEAR

## 2016-11-11 LAB — PROCALCITONIN: Procalcitonin: <0.1 ng/mL

## 2016-11-11 MED ORDER — FLUTICASONE PROPIONATE 50 MCG/ACT NA SUSP
1.0000 | Freq: Every day | NASAL | Status: DC
  Administered 2016-11-11 – 2016-11-14 (×4): 1 via NASAL
  Filled 2016-11-11: qty 16

## 2016-11-11 MED ORDER — POLYETHYLENE GLYCOL 3350 17 G PO PACK
17.0000 g | PACK | Freq: Every day | ORAL | Status: DC
  Administered 2016-11-11 – 2016-11-14 (×4): 17 g via ORAL
  Filled 2016-11-11 (×4): qty 1

## 2016-11-11 MED ORDER — CHLORHEXIDINE GLUCONATE 0.12 % MT SOLN
15.0000 mL | Freq: Two times a day (BID) | OROMUCOSAL | Status: DC
  Administered 2016-11-11 – 2016-11-14 (×7): 15 mL via OROMUCOSAL
  Filled 2016-11-11 (×5): qty 15

## 2016-11-11 MED ORDER — SALINE SPRAY 0.65 % NA SOLN
2.0000 | Freq: Two times a day (BID) | NASAL | Status: DC
  Administered 2016-11-11 – 2016-11-14 (×7): 2 via NASAL
  Filled 2016-11-11: qty 44

## 2016-11-11 MED ORDER — FERROUS SULFATE 325 (65 FE) MG PO TABS
325.0000 mg | ORAL_TABLET | Freq: Two times a day (BID) | ORAL | Status: DC
  Administered 2016-11-11 – 2016-11-14 (×6): 325 mg via ORAL
  Filled 2016-11-11 (×5): qty 1

## 2016-11-11 MED ORDER — CYANOCOBALAMIN 1000 MCG/ML IJ SOLN
1000.0000 ug | Freq: Once | INTRAMUSCULAR | Status: AC
  Administered 2016-11-11: 1000 ug via SUBCUTANEOUS
  Filled 2016-11-11: qty 1

## 2016-11-11 MED ORDER — ALPRAZOLAM 0.25 MG PO TABS
0.2500 mg | ORAL_TABLET | Freq: Every evening | ORAL | Status: DC | PRN
  Administered 2016-11-12 – 2016-11-13 (×2): 0.25 mg via ORAL
  Filled 2016-11-11 (×2): qty 1

## 2016-11-11 MED ORDER — ORAL CARE MOUTH RINSE
15.0000 mL | Freq: Two times a day (BID) | OROMUCOSAL | Status: DC
  Administered 2016-11-11 – 2016-11-13 (×6): 15 mL via OROMUCOSAL

## 2016-11-11 MED ORDER — VITAMIN C 500 MG PO TABS
250.0000 mg | ORAL_TABLET | Freq: Two times a day (BID) | ORAL | Status: DC
  Administered 2016-11-11 – 2016-11-14 (×7): 250 mg via ORAL
  Filled 2016-11-11 (×7): qty 1

## 2016-11-11 MED ORDER — PANTOPRAZOLE SODIUM 40 MG PO TBEC
40.0000 mg | DELAYED_RELEASE_TABLET | Freq: Every day | ORAL | Status: DC
  Administered 2016-11-11 – 2016-11-14 (×4): 40 mg via ORAL
  Filled 2016-11-11 (×4): qty 1

## 2016-11-11 MED ORDER — METOPROLOL TARTRATE 12.5 MG HALF TABLET: 12.5000 mg | ORAL_TABLET | Freq: Two times a day (BID) | ORAL | Status: DC

## 2016-11-11 MED ORDER — DOCUSATE SODIUM 100 MG PO CAPS
100.0000 mg | ORAL_CAPSULE | Freq: Every day | ORAL | Status: DC
  Administered 2016-11-11 – 2016-11-14 (×4): 100 mg via ORAL
  Filled 2016-11-11 (×4): qty 1

## 2016-11-11 NOTE — Care Management Note (Signed)
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 Failure and poss. pnja  Action/Plan: Date:  October 16,2 018 Chart reviewed for concurrent status and case management needs.  Will continue to follow patient progress.  Discharge Planning: following for needs  Expected discharge date: [id number]  Velva Harman, BSN, [place], [place]   Expected Discharge Date:                  Expected Discharge Plan:  [place]  In-House Referral:     Discharge planning Services  CM Consult  Post Acute Care Choice:  Resumption of Svcs/PTA Provider Choice offered to:     DME Arranged:    DME Agency:     HH Arranged:    [place] Agency:     Status of Service:  In process, will continue to follow  If discussed at Long Length of Stay Meetings, dates discussed:    Additional Comments:  Leeroy Cha, RN 11/11/2016, 8:50 AM

## 2016-11-11 NOTE — Consult Note (Signed)
New Hematology/Oncology Consult   Referral MD: Chesley Mires   Reason for Referral: Pancytopenia  HPI: [name] [name] was followed in the remote past at the [facility] for anemia and thrombocytopenia.He was last seenin 2013.he was admitted 2 weeks ago with respiratory failure and pneumonia. On 10/29/2016 the level returned at 8.3, MCV 69.6, platelet is 59,000, and white count 7.2 with absolute neutrophil count of 6.4.  He was transfused packed red blood cells on 10/30/2016.  [name] [name] was readmitted yesterday with respiratory failure.a chest x-ray showedCOPD in no acute change.  A repeat CBC today found the hemoglobin at 8.2, platelet is 45,000, and a white count of 2.8.  He reports a history of "thalassemia ". His sister also has thalassemia.  Easy bruising, no other bleeding.      Past Medical History:  Diagnosis Date  . Anemia, history of beta thalassemia trait   . CAD (coronary artery disease)   . Cataracts, bilateral   . CHF (congestive heart failure) ([place])   . Chronic airway obstruction, not elsewhere classified    on o2 at night  . Diabetes mellitus   . Diverticulosis of colon (without mention of hemorrhage)   . Dyslipidemia   . Emphysema   . Hemorrhoids   . ICD (implantable cardiac defibrillator) in place    [facility]  . Ischemic cardiomyopathy   . Macular degeneration   . MI, old 2010 or 2011  . Prostatic hypertrophy    hx of  . Retention of urine, unspecified   . Unspecified disorder resulting from impaired renal function   : .  Serum monoclonal protein  Past Surgical History:  Procedure Laterality Date  . CARDIAC DEFIBRILLATOR PLACEMENT  2011   [facility]  . CATARACT EXTRACTION    . PACEMAKER INSERTION  2011  . pci     4/11  . TONSILLECTOMY  83 yo  :   Current Facility-Administered Medications:  .  0.9 %  sodium chloride infusion, 250 mL, Intravenous, PRN, Javier Glazier, MD .  albuterol (PROVENTIL) (2.5 MG/3ML) 0.083% nebulizer solution 2.5 mg, 2.5  mg, Nebulization, Q2H PRN, Javier Glazier, MD, 2.5 mg at 11/11/16 0247 .  ALPRAZolam (XANAX) tablet 0.25 mg, 0.25 mg, Oral, QHS PRN, Chesley Mires, MD .  arformoterol (BROVANA) nebulizer solution 15 mcg, 15 mcg, Nebulization, BID, Javier Glazier, MD, 15 mcg at 11/11/16 0901 .  aspirin EC tablet 81 mg, 81 mg, Oral, Daily, Javier Glazier, MD, 81 mg at 11/11/16 1015 .  budesonide (PULMICORT) nebulizer solution 0.5 mg, 0.5 mg, Nebulization, BID, Javier Glazier, MD, 0.5 mg at 11/11/16 0908 .  chlorhexidine (PERIDEX) 0.12 % solution 15 mL, 15 mL, Mouth Rinse, BID, Javier Glazier, MD, 15 mL at 11/11/16 1015 .  clopidogrel (PLAVIX) tablet 75 mg, 75 mg, Oral, Daily, Javier Glazier, MD, 75 mg at 11/11/16 1016 .  dextromethorphan-guaiFENesin (MUCINEX DM) 30-600 MG per 12 hr tablet 1 tablet, 1 tablet, Oral, BID, Javier Glazier, MD, 1 tablet at 11/11/16 1015 .  docusate sodium (COLACE) capsule 100 mg, 100 mg, Oral, Daily, Sood, Vineet, MD, 100 mg at 11/11/16 1015 .  ferrous sulfate tablet 325 mg, 325 mg, Oral, BID WC, Sood, Vineet, MD .  fluticasone (FLONASE) 50 MCG/ACT nasal spray 1 spray, 1 spray, Each Nare, Daily, Sood, Vineet, MD, 1 spray at 11/11/16 1017 .  MEDLINE mouth rinse, 15 mL, Mouth Rinse, q12n4p, Javier Glazier, MD, 15 mL at 11/11/16 1152 .  methylPREDNISolone sodium  succinate (SOLU-MEDROL) 40 mg/mL injection 40 mg, 40 mg, Intravenous, Q12H, Roslynn Amble, MD, 40 mg at 11/11/16 0645 .  metoprolol tartrate (LOPRESSOR) tablet 12.5 mg, 12.5 mg, Oral, BID, Roslynn Amble, MD, 12.5 mg at 11/11/16 1015 .  pantoprazole (PROTONIX) EC tablet 40 mg, 40 mg, Oral, Daily, Sood, Vineet, MD, 40 mg at 11/11/16 1017 .  polyethylene glycol (MIRALAX / GLYCOLAX) packet 17 g, 17 g, Oral, Daily, Craige Cotta, Vineet, MD, 17 g at 11/11/16 1015 .  rosuvastatin (CRESTOR) tablet 5 mg, 5 mg, Oral, q1800, Roslynn Amble, MD .  senna-docusate (Senokot-S) tablet 1 tablet, 1 tablet, Oral, QHS,  Roslynn Amble, MD, 1 tablet at 11/10/16 2245 .  sodium chloride (OCEAN) 0.65 % nasal spray 2 spray, 2 spray, Each Nare, BID, Coralyn Helling, MD, 2 spray at 11/11/16 1017 .  vitamin C (ASCORBIC ACID) tablet 250 mg, 250 mg, Oral, BID, Sood, Vineet, MD, 250 mg at 11/11/16 1016:  . arformoterol  15 mcg Nebulization BID  . aspirin EC  81 mg Oral Daily  . budesonide (PULMICORT) nebulizer solution  0.5 mg Nebulization BID  . chlorhexidine  15 mL Mouth Rinse BID  . clopidogrel  75 mg Oral Daily  . dextromethorphan-guaiFENesin  1 tablet Oral BID  . docusate sodium  100 mg Oral Daily  . ferrous sulfate  325 mg Oral BID WC  . fluticasone  1 spray Each Nare Daily  . mouth rinse  15 mL Mouth Rinse q12n4p  . methylPREDNISolone (SOLU-MEDROL) injection  40 mg Intravenous Q12H  . metoprolol tartrate  12.5 mg Oral BID  . pantoprazole  40 mg Oral Daily  . polyethylene glycol  17 g Oral Daily  . rosuvastatin  5 mg Oral q1800  . senna-docusate  1 tablet Oral QHS  . sodium chloride  2 spray Each Nare BID  . 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   Physical Exam:  Blood pressure 140/76, pulse 71, temperature 97.8 F (36.6 C), temperature source Oral, resp. rate (!) 24, height 6' (1.829 m), weight 156 lb 8.4 oz (71 kg), SpO2 97 %.  HEENT: no thrush Lungs: distant breath sounds with scattered expiratory wheezes Cardiac: distant heart sounds Abdomen: no hepatosplenomegaly  Vascular: no leg edema Lymph nodes: no cervical, supraclavicular, axillary, or inguinal nodes Neurologic: alert and oriented, the motor exam appears intact in the upper and lower extremities Skin: scattered ecchymoses over the trunk and extremities Musculoskeletal: no spine tenderness  LABS:   Recent Labs   11/10/16 1755 11/11/16 0638  WBC 4.4 2.8*  HGB 9.0* 8.2*  HCT 27.6* 24.9*  PLT 56* 45*  blood smear 11/11/2016: The platelets are decreased in number, most of the platelets are small. No platelet clumps. The majority of the white cells are mature neutrophils, some are hypolobated. No blasts or other young forms are present. The polychromasia is not increased. There are numerous elliptocytes and cigar cells. No targeting.   Recent Labs  11/10/16 1755 11/11/16 0638  NA 139 136  K 4.7 4.9  CL 104 105  CO2 26 23  GLUCOSE 93 120*  BUN 38* 37*  CREATININE 1.36* 1.20  CALCIUM 8.4* 8.2*      RADIOLOGY:  Dg Chest Port 1 View  Result Date: 11/10/2016 CLINICAL DATA:  Hypoxia  and anemia.  COPD exacerbation. EXAM: PORTABLE CHEST 1 VIEW COMPARISON:  10/29/2016 FINDINGS: Emphysematous hyperinflation of the lungs are again noted with minimal scarring at the left lung base. No pneumonic consolidation or overt pulmonary edema. No pneumothorax. No pleural effusion. Heart size is within normal limits with right atrial pacer and right ventricular defibrillator leads in place. Left-sided ICD device projects over the shoulder. Tortuous aorta with aortic atherosclerosis, stable in appearance without aneurysm. Pulmonary vasculature is within normal limits. High-riding humeral heads again noted which may reflect chronic rotator cuff tears. IMPRESSION: 1. 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 2. Stable aortic atherosclerosis. 3. No acute pneumonic consolidation or CHF. Electronically Signed   By: Ashley Royalty M.D.   On: 11/10/2016 17:59   Dg Chest Port 1 View  Result Date: 10/29/2016 CLINICAL DATA:  Shortness of Breath EXAM: PORTABLE CHEST 1 VIEW COMPARISON:  Study obtained earlier in the day FINDINGS: Ill-defined opacity remains on the left, indicative of a degree of pneumonia. Lungs elsewhere are clear. The heart size and pulmonary vascularity are normal. There is aortic  atherosclerosis. Pacemaker leads are attached the right atrium and right ventricle. There is degenerative change in each shoulder. There is superior migration of each humeral head. IMPRESSION: Pneumonia in left upper lobe, slightly better defined than earlier in the day. Lungs elsewhere clear. Stable cardiac silhouette. There is aortic atherosclerosis. There are chronic rotator cuff tears bilaterally, characterized by elevation in each humeral head. Aortic Atherosclerosis (ICD10-I70.0). Followup PA and lateral chest radiographs recommended in 3-4 weeks following trial of antibiotic therapy to ensure resolution and exclude underlying malignancy. Electronically Signed   By: Lowella Grip III M.D.   On: 10/29/2016 13:29   Dg Chest Portable 1 View  Result Date: 10/29/2016 CLINICAL DATA:  Shortness of breath with wheezing.  Cough. EXAM: PORTABLE CHEST 1 VIEW COMPARISON:  Jun 14, 2015 FINDINGS: Lungs are mildly hyperexpanded. There is ill-defined opacity in the left mid lung region, suspicious for a developing infiltrate/pneumonia. Lungs elsewhere are clear. Heart size and pulmonary vascularity are normal. Pacemaker leads are attached to the right atrium and right ventricle. There is aortic atherosclerosis. No adenopathy. There is degenerative change in the shoulders. There is superior migration of the right humeral head. IMPRESSION: Rather subtle ill-defined opacity in the left mid lung region. Suspect developing pneumonia. Lungs elsewhere clear. Heart size within normal limits. There is aortic atherosclerosis. Pacemaker leads attached to right atrium and right ventricle. Chronic rotator cuff tear on the right with superior migration of the right humeral head. Aortic Atherosclerosis (ICD10-I70.0). Followup PA and lateral chest radiographs recommended in 3-4 weeks following trial of antibiotic therapy to ensure resolution and exclude underlying malignancy. Electronically Signed   By: Lowella Grip III M.D.    On: 10/29/2016 07:37    Assessment and Plan:   1. Pancytopenia 2. History of beta thalassemia trait 3. History of vitamin B-12 deficiency-not maintained on vitamin B-12 replacement 4. History of a serum monoclonal IgA protein 5. COPD 6. History of coronary artery disease 7. Ischemic cardiomyopathy 8. ICD in place 9. Prostatic hypertrophy   [name] [name] is admitted with respiratory failure, likely related to COPD. He has severe anemia. He also has mild leukopenia and thrombocytopenia.He has a reported history of beta thalassemia trait.  The anemia appears out of proportion to anemia expected with beta thalassemia trait. The peripheral blood smear findings are also not typical of thalassemia trait. I suspect the anemia and leukopenia/thrombocytopenia are multi factorial. The vitamin  B 12 level is low and may be contributing significantly. He could also have underlying myelodysplasia or a lymphoproliferative disorder.he has a history of a serum monoclonal protein and may have developed myeloma.  Recommendations: 1. Begin vitamin B-12 replacement 2. Serum protein electrophoresis/immunofixation and serum light chain analysis 3. Outpatient follow-up of the CBC, proceed with bone marrow biopsy if the cytopenias do not improve. 4. I will continue following him in the hospital and we will schedule an outpatient appointment.  I discussed the case with his daughter.   Donneta Romberg, MD 11/11/2016, 4:10 PM

## 2016-11-11 NOTE — Progress Notes (Signed)
[facility]  Patient Status: Active (receiving services up to time of hospitalization)  [facility] is providing the following services: RN, PT, OT and HHA  If patient discharges after hours, please call [phone].   [name] [name] 11/11/2016, 9:48 AM

## 2016-11-11 NOTE — Consult Note (Signed)
   [facility] CM Inpatient Consult   11/11/2016  [name] Azure, MD [id number]   Received notification from [facility] office of hospital admit.   [name] triggered red from [facility] pneumonia calls prior to hospital admission.   He is currently in stepdown unit.   Will continue to follow and engage patient for potential [facility] Care Management services when stable.   Will make inpatient RNCM aware.    Marthenia Rolling, MSN-Ed, RN,BSN [facility] Liaison [phone]

## 2016-11-11 NOTE — Progress Notes (Signed)
PULMONARY / CRITICAL CARE MEDICINE   Name: [name] Banik, MD MRN: [id number] DOB: Jun 09, 1925    ADMISSION DATE:  11/10/2016  CHIEF COMPLAINT: Short of breath  HISTORY OF PRESENT ILLNESS:   83 yo male former smoker seen in pulmonary office for dyspnea and worsening hypoxia after recent hospital admission for PNA, AECOPD, and anemia requiring transfusion.  He has decreased appetite and weight loss.  PMHx of CAD, DM, HLD, ischemic CM, s/p AICD, Macular degeneration, BPH, Beta thalassemia, MGUS IgA subtype.  SUBJECTIVE:  He doesn't have much appetite.  Has sore throat and wheeze from throat.  C/o constipation.  Feels weak.  Has post nasal drip.  Gets bloating and reflux with "phlegm" after eating.  VITAL SIGNS: BP (!) 141/84   Pulse 71   Temp 98.2 F (36.8 C) (Axillary)   Resp 20   Ht 6' (1.829 m)   Wt 156 lb 8.4 oz (71 kg)   SpO2 98%   BMI 21.23 kg/m   INTAKE / OUTPUT: I/O last 3 completed shifts: In: 11 [IV Piggyback:50] Out: 750 [Urine:750]  PHYSICAL EXAMINATION:  General - pleasant, cachetic Eyes - pupils reactive ENT - no sinus tenderness, no oral exudate, no LAN, decreased hearing Cardiac - regular, no murmur Chest - no wheeze, rales Abd - soft, non tender Ext - no edema Skin - no rashes Neuro - normal strength Psych - normal mood  LABS:  BMET  Recent Labs Lab 11/10/16 1755 11/11/16 0638  NA 139 136  K 4.7 4.9  CL 104 105  CO2 26 23  BUN 38* 37*  CREATININE 1.36* 1.20  GLUCOSE 93 120*    Electrolytes  Recent Labs Lab 11/10/16 1755 11/11/16 0638  CALCIUM 8.4* 8.2*  MG 2.1 2.1  PHOS 4.1 4.9*    CBC  Recent Labs Lab 11/10/16 1755 11/11/16 0638  WBC 4.4 2.8*  HGB 9.0* 8.2*  HCT 27.6* 24.9*  PLT 56* 45*    Sepsis Markers  Recent Labs Lab 11/10/16 1755 11/10/16 2033  LATICACIDVEN 1.2 0.9  PROCALCITON 0.10  --     Liver Enzymes  Recent Labs Lab 11/10/16 1755  AST 19  ALT 23  ALKPHOS 44  BILITOT 0.5  ALBUMIN 3.3*     Cardiac Enzymes  Recent Labs Lab 11/10/16 1755 11/10/16 2327 11/11/16 0638  TROPONINI 0.13* 0.10* 0.07*    Imaging Dg Chest Port 1 View  Result Date: 11/10/2016 CLINICAL DATA:  Hypoxia and anemia.  COPD exacerbation. EXAM: PORTABLE CHEST 1 VIEW COMPARISON:  10/29/2016 FINDINGS: Emphysematous hyperinflation of the lungs are again noted with minimal scarring at the left lung base. No pneumonic consolidation or overt pulmonary edema. No pneumothorax. No pleural effusion. Heart size is within normal limits with right atrial pacer and right ventricular defibrillator leads in place. Left-sided ICD device projects over the shoulder. Tortuous aorta with aortic atherosclerosis, stable in appearance without aneurysm. Pulmonary vasculature is within normal limits. High-riding humeral heads again noted which may reflect chronic rotator cuff tears. IMPRESSION: 1. 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 2. Stable aortic atherosclerosis. 3. No acute pneumonic consolidation or CHF. Electronically Signed   By: Ashley Royalty M.D.   On: 11/10/2016 17:59    CULTURES: Blood 10/15 >>  Pneumococcal Ag 10/15 >>   ANTIBIOTICS: Vancomycin 10/15 >> 10/16  Fortaz 10/15 >> 10/16   SIGNIFICANT EVENTS: 10/15 Admit  ASSESSMENT / PLAN:  Acute on chronic respiratory failure with hypoxia. AECOPD. - oxygen to keep SpO2 90 to 95% -  continue brovana, pulmicort, prn albuterol - continue solumedrol - no definite infiltrate on CXR and procalcitonin negative >> d/c Abx  Cough. - likely from post nasal drip and reflux - add nasal irrigation, flonase - add protonix  Hx of CAD, HTN, HLD, chronic diastolic CHF. - continue ASA, plavix, crestor, lopressor  Iron deficiency anemia. Pancytopenia. Hx of Beta Thalassemia >> previously followed by Dr. Jamse Arn. - continue FeSO4 with vitamin C - consult hematology  Severe protein calorie malnutrition. Constipation. - advance diet -  bowel regimen  BPH. - d/w pt concerns about using foley - In/Out cath as needed  Deconditioning. - PT consult  DVT prophylaxis - SCDs SUP - no indicated Nutrition - regular diet Goals of care - full code  Keep in SDU status  Updated pt's daughter at bedside  Chesley Mires, MD [place] 11/11/2016, 9:00 AM Pager:  [phone] After 3pm call: [phone]

## 2016-11-12 LAB — CBC WITH DIFFERENTIAL/PLATELET
BASOS PCT: 0 %
Basophils Absolute: 0 10*3/uL (ref 0.0–0.1)
EOS ABS: 0 10*3/uL (ref 0.0–0.7)
EOS PCT: 0 %
HCT: 25.7 % — ABNORMAL LOW (ref 39.0–52.0)
HEMOGLOBIN: 8.5 g/dL — AB (ref 13.0–17.0)
LYMPHS ABS: 0.4 10*3/uL — AB (ref 0.7–4.0)
Lymphocytes Relative: 13 %
MCH: 23.6 pg — AB (ref 26.0–34.0)
MCHC: 33.1 g/dL (ref 30.0–36.0)
MCV: 71.4 fL — ABNORMAL LOW (ref 78.0–100.0)
MONO ABS: 0.1 10*3/uL (ref 0.1–1.0)
MONOS PCT: 2 %
Neutro Abs: 2.6 10*3/uL (ref 1.7–7.7)
Neutrophils Relative %: 85 %
Platelets: 44 10*3/uL — ABNORMAL LOW (ref 150–400)
RBC: 3.6 MIL/uL — ABNORMAL LOW (ref 4.22–5.81)
RDW: 19.1 % — AB (ref 11.5–15.5)
WBC: 3.1 10*3/uL — ABNORMAL LOW (ref 4.0–10.5)

## 2016-11-12 LAB — COMPREHENSIVE METABOLIC PANEL
ALBUMIN: 2.9 g/dL — AB (ref 3.5–5.0)
ALT: 20 U/L (ref 17–63)
ANION GAP: 8 (ref 5–15)
AST: 16 U/L (ref 15–41)
Alkaline Phosphatase: 39 U/L (ref 38–126)
BUN: 42 mg/dL — ABNORMAL HIGH (ref 6–20)
CALCIUM: 8.3 mg/dL — AB (ref 8.9–10.3)
CO2: 25 mmol/L (ref 22–32)
Chloride: 103 mmol/L (ref 101–111)
Creatinine, Ser: 1.12 mg/dL (ref 0.61–1.24)
GFR calc non Af Amer: 56 mL/min — ABNORMAL LOW (ref >60)
GLUCOSE: 156 mg/dL — AB (ref 65–99)
POTASSIUM: 4.7 mmol/L (ref 3.5–5.1)
SODIUM: 136 mmol/L (ref 135–145)
TOTAL PROTEIN: 5.8 g/dL — AB (ref 6.5–8.1)
Total Bilirubin: 0.6 mg/dL (ref 0.3–1.2)

## 2016-11-12 LAB — LEGIONELLA PNEUMOPHILA SEROGP 1 UR AG: L. PNEUMOPHILA SEROGP 1 UR AG: NEGATIVE

## 2016-11-12 LAB — KAPPA/LAMBDA LIGHT CHAINS
KAPPA FREE LGHT CHN: 221.4 mg/L — AB (ref 3.3–19.4)
Kappa, lambda light chain ratio: 18.92 — ABNORMAL HIGH (ref 0.26–1.65)
LAMDA FREE LIGHT CHAINS: 11.7 mg/L (ref 5.7–26.3)

## 2016-11-12 LAB — MAGNESIUM: Magnesium: 2.1 mg/dL (ref 1.7–2.4)

## 2016-11-12 LAB — PHOSPHORUS: PHOSPHORUS: 4 mg/dL (ref 2.5–4.6)

## 2016-11-12 MED ORDER — MAGNESIUM HYDROXIDE 400 MG/5ML PO SUSP
30.0000 mL | Freq: Every day | ORAL | Status: DC | PRN
  Administered 2016-11-12: 30 mL via ORAL
  Filled 2016-11-12: qty 30

## 2016-11-12 NOTE — Evaluation (Signed)
Physical Therapy Evaluation Patient Details Name: [name] Candelas, MD MRN: [id number] DOB: Apr 24, 1925 Today's Date: 11/12/2016   History of Present Illness  Pt is a 83 y/o male admited with resp failure 2* PNA.  PMH includes DM, CKD, MI, COPD, CAD, CHF, and ischemic cardiomyopathy.   Clinical Impression  Pt admitted as above and presenting with functional mobility limitations 2* generalized weakness, limited endurance and ambulatory balance deficits.  Pt plans dc to home with assist of family and aide.    Follow Up Recommendations Home health PT;Supervision/Assistance - 24 hour    Equipment Recommendations  None recommended by PT    Recommendations for Other Services OT consult     Precautions / Restrictions Precautions Precautions: Fall Precaution Comments: Home O2 @ 2.5 L Restrictions Weight Bearing Restrictions: No      Mobility  Bed Mobility Overal bed mobility: Needs Assistance Bed Mobility: Supine to Sit     Supine to sit: Supervision     General bed mobility comments: use of bed rail and with HOB elevated 40 degrees  Transfers Overall transfer level: Needs assistance Equipment used: Rolling walker (2 wheeled) Transfers: Sit to/from Stand Sit to Stand: Min guard         General transfer comment: min guard for safety; cues for safe hand placement  Ambulation/Gait Ambulation/Gait assistance: Min assist Ambulation Distance (Feet): 150 Feet Assistive device: Rolling walker (2 wheeled) Gait Pattern/deviations: Step-through pattern;Decreased step length - right;Decreased step length - left;Trunk flexed     General Gait Details: cues for posture and position from RW.  Three standing rest breaks to complete task but maintained SaO2 at 93-100% on 3L O2  Stairs            Wheelchair Mobility    Modified Rankin (Stroke Patients Only)       Balance Overall balance assessment: Needs assistance Sitting-balance support: No upper extremity  supported;Feet supported Sitting balance-Leahy Scale: Good     Standing balance support: Bilateral upper extremity supported;During functional activity Standing balance-Leahy Scale: Poor                               Pertinent Vitals/Pain Pain Assessment: No/denies pain    Home Living Family/patient expects to be discharged to:: Private residence Living Arrangements: Alone Available Help at Discharge: Family;Personal care attendant;Available PRN/intermittently Type of Home: House Home Access: Stairs to enter Entrance Stairs-Rails: None Entrance Stairs-Number of Steps: 1 Home Layout: Two level;Laundry or work area in [place]: Wheelchair - manual;Shower seat;Bedside commode;Walker - 2 wheels Additional Comments: Family checks in with pt multiple times throughout the day.     Prior Function Level of Independence: Needs assistance   Gait / Transfers Assistance Needed: Pt reports he uses WC or desk chair for mobility 2* sore knees and breathing issues. Has scooter for mobility outside of home.   ADL's / Homemaking Assistance Needed: Has an aide that comes MWF in the afternoon to assist with cleaning and meal prep as needed.         Hand Dominance   Dominant Hand: Right    Extremity/Trunk Assessment   Upper Extremity Assessment Upper Extremity Assessment: Generalized weakness    Lower Extremity Assessment Lower Extremity Assessment: Generalized weakness    Cervical / Trunk Assessment Cervical / Trunk Assessment: Kyphotic  Communication   Communication: HOH  Cognition Arousal/Alertness: Awake/alert Behavior During Therapy: WFL for tasks assessed/performed Overall Cognitive Status: Within Functional Limits for tasks assessed  General Comments      Exercises     Assessment/Plan    PT Assessment Patient needs continued PT services  PT Problem List Decreased strength;Decreased  activity tolerance;Decreased balance;Cardiopulmonary status limiting activity;Decreased mobility;Decreased knowledge of use of DME       PT Treatment Interventions DME instruction;Therapeutic activities;Functional mobility training;Therapeutic exercise;Balance training;Neuromuscular re-education;Patient/family education;Stair training;Wheelchair mobility training    PT Goals (Current goals can be found in the Care Plan section)  Acute Rehab PT Goals Patient Stated Goal: to feel better  PT Goal Formulation: With patient/family Time For Goal Achievement: 11/13/16 Potential to Achieve Goals: Fair    Frequency Min 3X/week   Barriers to discharge Decreased caregiver support Lives alone with family checking frequently and aide 3 days/wk    Co-evaluation               AM-PAC PT "6 Clicks" Daily Activity  Outcome Measure Difficulty turning over in bed (including adjusting bedclothes, sheets and blankets)?: A Little Difficulty moving from lying on back to sitting on the side of the bed? : A Lot Difficulty sitting down on and standing up from a chair with arms (e.g., wheelchair, bedside commode, etc,.)?: A Lot Help needed moving to and from a bed to chair (including a wheelchair)?: A Little Help needed walking in hospital room?: A Little Help needed climbing 3-5 steps with a railing? : A Lot 6 Click Score: 15    End of Session Equipment Utilized During Treatment: Gait belt;Oxygen Activity Tolerance: Patient tolerated treatment well;Patient limited by fatigue Patient left: in chair;with call bell/phone within reach;with family/visitor present Nurse Communication: Mobility status PT Visit Diagnosis: Unsteadiness on feet (R26.81);Muscle weakness (generalized) (M62.81);Difficulty in walking, not elsewhere classified (R26.2)    Time: 1310-1340 PT Time Calculation (min) (ACUTE ONLY): 30 min   Charges:   PT Evaluation $PT Eval Moderate Complexity: 1 Mod PT Treatments $Gait  Training: 8-22 mins   PT G Codes:        Pg [phone]   Tniya Bowditch 11/12/2016, 2:19 PM

## 2016-11-12 NOTE — Progress Notes (Signed)
PULMONARY / CRITICAL CARE MEDICINE   Name: [name] Koren, MD MRN: [id number] DOB: 1925/08/10    ADMISSION DATE:  11/10/2016  CHIEF COMPLAINT: Short of breath  HISTORY OF PRESENT ILLNESS:   83 yo male former smoker seen in pulmonary office for dyspnea and worsening hypoxia after recent hospital admission for PNA, AECOPD, and anemia requiring transfusion.  He has decreased appetite and weight loss.  PMHx of CAD, DM, HLD, ischemic CM, s/p AICD, Macular degeneration, BPH, Beta thalassemia, MGUS IgA subtype.  SUBJECTIVE:  Feeling better.  Slept better.  Still has cough.  Still having trouble with constipation.  VITAL SIGNS: BP (!) 146/61 (BP Location: Left Arm)   Pulse 64   Temp 98.2 F (36.8 C) (Oral)   Resp (!) 24   Ht 6' (1.829 m)   Wt 156 lb 8.4 oz (71 kg)   SpO2 95%   BMI 21.23 kg/m   INTAKE / OUTPUT: I/O last 3 completed shifts: In: 460 [P.O.:410; IV Piggyback:50] Out: 2250 [Urine:2250]  PHYSICAL EXAMINATION: . General - pleasant, cachetic Eyes - pupils reactive ENT - no sinus tenderness, no oral exudate, no LAN, decreased hearing Cardiac - regular, no murmur Chest - no wheeze, rales Abd - soft, non tender Ext - no edema Skin - no rashes Neuro - normal strength Psych - normal mood   LABS:  BMET  Recent Labs Lab 11/10/16 1755 11/11/16 0638 11/12/16 0325  NA 139 136 136  K 4.7 4.9 4.7  CL 104 105 103  CO2 26 23 25   BUN 38* 37* 42*  CREATININE 1.36* 1.20 1.12  GLUCOSE 93 120* 156*    Electrolytes  Recent Labs Lab 11/10/16 1755 11/11/16 0638 11/12/16 0325  CALCIUM 8.4* 8.2* 8.3*  MG 2.1 2.1 2.1  PHOS 4.1 4.9* 4.0    CBC  Recent Labs Lab 11/10/16 1755 11/11/16 0638 11/12/16 0325  WBC 4.4 2.8* 3.1*  HGB 9.0* 8.2* 8.5*  HCT 27.6* 24.9* 25.7*  PLT 56* 45* 44*    Sepsis Markers  Recent Labs Lab 11/10/16 1755 11/10/16 2033 11/11/16 0638  LATICACIDVEN 1.2 0.9  --   PROCALCITON 0.10  --  <0.10    Liver Enzymes  Recent  Labs Lab 11/10/16 1755 11/12/16 0325  AST 19 16  ALT 23 20  ALKPHOS 44 39  BILITOT 0.5 0.6  ALBUMIN 3.3* 2.9*    Cardiac Enzymes  Recent Labs Lab 11/10/16 1755 11/10/16 2327 11/11/16 0638  TROPONINI 0.13* 0.10* 0.07*    Imaging No results found.  CULTURES: Blood 10/15 >>  Pneumococcal Ag 10/15 >> negative  ANTIBIOTICS: Vancomycin 10/15 >> 10/16  Fortaz 10/15 >> 10/16   SIGNIFICANT EVENTS: 10/15 Admit 10/16 Hematology consulted  ASSESSMENT / PLAN:  Acute on chronic respiratory failure with hypoxia. AECOPD. - oxygen to keep SpO2 90 to 95% > he would like assessment for POC as outpt - continue brovana, pulmicort, prn albuterol - continue solumedrol >> likely transition to prednisone in next 24 to 48 hrs  Cough. - likely from post nasal drip and reflux - continue nasal irrigation, flonase, and protonix  Hx of CAD, HTN, HLD, chronic diastolic CHF. - continue ASA, plavix, crestor, lopressor  Iron deficiency anemia. Pancytopenia. Hx of Beta Thalassemia. - continue FeSO4 and vitamin C - continue B12 replacement - f/u protein electrophoresis/immunofixation - f/u CBC  Severe protein calorie malnutrition. Constipation. - bowel regimen - add milk of magnesia  BPH. - d/w pt concerns about using foley - In/Out cath as needed  Deconditioning. -  PT consult  DVT prophylaxis - SCDs SUP - no indicated Nutrition - regular diet Goals of care - full code  Updated pt's daughter at bedside  Chesley Mires, MD [place] 11/12/2016, 9:48 AM Pager:  [phone] After 3pm call: [phone]

## 2016-11-13 ENCOUNTER — Encounter: Payer: Medicare Other | Admitting: *Deleted

## 2016-11-13 ENCOUNTER — Other Ambulatory Visit: Payer: Self-pay | Admitting: *Deleted

## 2016-11-13 DIAGNOSIS — J441 Chronic obstructive pulmonary disease with (acute) exacerbation: Principal | ICD-10-CM

## 2016-11-13 DIAGNOSIS — E538 Deficiency of other specified B group vitamins: Secondary | ICD-10-CM

## 2016-11-13 DIAGNOSIS — R5381 Other malaise: Secondary | ICD-10-CM

## 2016-11-13 DIAGNOSIS — R0982 Postnasal drip: Secondary | ICD-10-CM

## 2016-11-13 LAB — BASIC METABOLIC PANEL
Anion gap: 7 (ref 5–15)
BUN: 42 mg/dL — AB (ref 6–20)
CO2: 25 mmol/L (ref 22–32)
CREATININE: 1.18 mg/dL (ref 0.61–1.24)
Calcium: 8.1 mg/dL — ABNORMAL LOW (ref 8.9–10.3)
Chloride: 104 mmol/L (ref 101–111)
GFR calc Af Amer: >60 mL/min (ref >60)
GFR calc non Af Amer: 53 mL/min — ABNORMAL LOW (ref >60)
Glucose, Bld: 134 mg/dL — ABNORMAL HIGH (ref 65–99)
POTASSIUM: 5.3 mmol/L — AB (ref 3.5–5.1)
SODIUM: 136 mmol/L (ref 135–145)

## 2016-11-13 LAB — CBC
HCT: 25.2 % — ABNORMAL LOW (ref 39.0–52.0)
Hemoglobin: 8.1 g/dL — ABNORMAL LOW (ref 13.0–17.0)
MCH: 23.3 pg — AB (ref 26.0–34.0)
MCHC: 32.1 g/dL (ref 30.0–36.0)
MCV: 72.4 fL — ABNORMAL LOW (ref 78.0–100.0)
PLATELETS: 32 10*3/uL — AB (ref 150–400)
RBC: 3.48 MIL/uL — AB (ref 4.22–5.81)
RDW: 19 % — AB (ref 11.5–15.5)
WBC: 3.3 10*3/uL — ABNORMAL LOW (ref 4.0–10.5)

## 2016-11-13 MED ORDER — PREDNISONE 20 MG PO TABS
20.0000 mg | ORAL_TABLET | Freq: Every day | ORAL | Status: DC
  Administered 2016-11-13 – 2016-11-14 (×2): 20 mg via ORAL
  Filled 2016-11-13 (×2): qty 1

## 2016-11-13 NOTE — Discharge Summary (Signed)
Physician Discharge Summary  Patient ID: Marcas Bowsher, MD MRN: [id number] DOB/AGE: [birth year] 83 y.o.  Admit date: 11/10/2016 Discharge date: 11/14/2016    Discharge Diagnoses:  Acute Exacerbation of COPD  Acute on Chronic Hypoxic Respiratory Failure  Cough  CAD HTN HLD Chronic Diastolic CHF  Iron Deficiency Anemia  Pancytopenia  Beta Thalassemia Severe Protein Calorie Malnutrition  Constipation  BPH  Deconditioning                                                                       DISCHARGE PLAN BY DIAGNOSIS      Acute on chronic respiratory failure with hypoxia. AECOPD  Discharge Plan: Continue home O2, 3L per [place]  Portable O2 concentrator ordered for discharge > personally called [facility] and discussed options for patient with them and asked them to call [facility].  They are going to do an in home review for device appropriateness.   Continue Brovana, pulmicort and PRN albuterol  Prednisone 20 mg QD for 4 days, then 10 mg QD x4 days and stop Continue mucinex for secretions Follow up arranged with Dr. Ashok Cordia  Cough - thought secondary to post nasal gtt and GERD  Discharge Plan: Continue nasal irrigation, flonase & protonix   Hx of CAD, HTN, HLD, chronic diastolic CHF  Discharge Plan: Continue ASA, plavix, crestor, lopressor  May need to hold ASA, plavix (per Dr. Gearldine Shown note, if ok with Cardiology)  Iron deficiency anemia. Pancytopenia. Hx of Beta Thalassemia  Discharge Plan: Continue FeSO4 and vitamin C  Continue B12 replacement  Follow up protein electrophoresis / immunofixation  Follow up with Hematology as outpatient  Trend CBC  Severe protein calorie malnutrition. 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    He was recently hospitalized 10/3-10/7 for LUL pneumonia and acute on chronic hypoxic respiratory failure.  He required transfusion for anemia during that admission and treatment for a COPD exacerbation.  He was discharged on oral antibiotics and prednisone taper.  He developed worsening dyspnea that led to seeking an acute visit on 10/15 with Dr. Ashok Cordia.  The patient reported ongoing weight loss / decreased appetite since discharge.  He has also had difficulties with constipation.  Due to patients progressive symptoms, decision was made to hospitalize him for further evaluation.  Urine strep antigen assessed and negative.  Initial CXR did not show infiltrate.  He was treated for cough with nasal irrigation, flonase and protonix.  The patient was felt to have ongoing AECOPD and was treated with steroids, nebulized bronchodilators and O2.  Oxygen was increased to 3L / [place] for discharge.  The patient was evaluated by Dr. [name] for anemia.  He was  placed on iron and vitamin C with plans for outpatient follow up.  The patient made slow improvement and was transitioned out of the ICU on 10/18.  He was medically cleared for discharge 10/19 with plans as above.  Daughter [name]) updated on plan of care via phone.                  CULTURES: Blood 10/15 >>  Pneumococcal Ag 10/15 >> negative  ANTIBIOTICS: Vancomycin 10/15 >> 10/16  Fortaz 10/15 >> 10/16   SIGNIFICANT EVENTS: 10/15 Admit 10/16 Hematology consulted  Discharge Exam: General:  Frail elderly male in NAD, sitting up in chair HEENT: MM pink/moist, no jvd, HOH PSY: calm/appropriate  Neuro: AAOx4, speech clear, MAE  CV: s1s2 rrr, no m/r/g PULM: even/non-labored, lungs  bilaterally diminished with wheezing (improved) MN:OTRR, non-tender, bsx4 active  Extremities: warm/dry, no edema  Skin: no rashes or lesions   Vitals:   11/13/16 1942 11/13/16 2200 11/14/16 0350 11/14/16 0826  BP:  (!) 101/52 (!) 123/45   Pulse:  (!) 59 (!) 57   Resp:  20 (!) 22   Temp:  (!) 97.5 F (36.4 C) 97.7 F (36.5 C)   TempSrc:  Oral Oral   SpO2: 99% 99% 100% 98%  Weight:      Height:         Discharge Labs  BMET  Recent Labs Lab 11/10/16 1755 11/11/16 0638 11/12/16 0325 11/13/16 0345  NA 139 136 136 136  K 4.7 4.9 4.7 5.3*  CL 104 105 103 104  CO2 _0 GLUCOSE 93 120* 156* 134*  BUN 38* 37* 42* 42*  CREATININE 1.36* 1.20 1.12 1.18  CALCIUM 8.4* 8.2* 8.3* 8.1*  MG 2.1 2.1 2.1  --   PHOS 4.1 4.9* 4.0  --     CBC  Recent Labs Lab 11/11/16 0638 11/12/16 0325 11/13/16 0345  HGB 8.2* 8.5* 8.1*  HCT 24.9* 25.7* 25.2*  WBC 2.8* 3.1* 3.3*  PLT 45* 44* 32*   Follow-up Information    Javier Glazier, MD Follow up on 11/20/2016.   Specialty:  Pulmonary Disease Why:  Appointment at 3:15 pm for hospital follow up.  Contact information: [address] [place] [address] [phone]        Ladell Pier, MD Follow up.   Specialty:  Oncology Why:  Call for follow up regarding anemia Contact information: [place] [address] [phone]            Allergies as of 11/14/2016   No Known Allergies     Medication List    TAKE these medications   albuterol (2.5 MG/3ML) 0.083% nebulizer solution Commonly known as:  PROVENTIL Take 2.5 mg by nebulization every 4 (four) hours as needed for wheezing or shortness of breath.   ALPRAZolam 0.25 MG tablet Commonly known as:  XANAX Take 0.25 mg by mouth at bedtime as needed for sleep.   arformoterol 15 MCG/2ML Nebu Commonly known as:  BROVANA Take 15 mcg by nebulization 2 (two) times daily.   ascorbic acid 250 MG tablet Commonly known  as:  VITAMIN C Take 1 tablet (250 mg total) by mouth 2 (two) times daily.   aspirin EC 81 MG tablet Take 1 tablet (81 mg total) by mouth at bedtime.   bisacodyl 5 MG EC tablet Commonly known as:  DULCOLAX Take 5 mg by mouth daily as needed for moderate constipation.   budesonide 0.5 MG/2ML  nebulizer solution Commonly known as:  PULMICORT Take 2 mLs (0.5 mg total) by nebulization 2 (two) times daily.   clopidogrel 75 MG tablet Commonly known as:  PLAVIX Take 1 tablet (75 mg total) by mouth daily.   docusate sodium 100 MG capsule Commonly known as:  COLACE Take 100 mg by mouth 2 (two) times daily as needed for mild constipation.   ferrous sulfate 325 (65 FE) MG tablet Take 1 tablet (325 mg total) by mouth 2 (two) times daily with a meal.   fluticasone 50 MCG/ACT nasal spray Commonly known as:  FLONASE Place 1 spray into both nostrils daily.   metoprolol tartrate 25 MG tablet Commonly known as:  LOPRESSOR Take 12.5 mg by mouth 2 (two) times daily. Reported on 06/06/2015   MUCINEX 600 MG 12 hr tablet Generic drug:  guaiFENesin Take 600 mg by mouth 2 (two) times daily as needed for cough or to loosen phlegm.   nitroGLYCERIN 0.4 MG SL tablet Commonly known as:  NITROSTAT Place 1 tablet (0.4 mg total) under the tongue every 5 (five) minutes as needed for chest pain. UP TO 3 DOSES THEN CALL EMS   OXYGEN Inhale 2 L into the lungs at bedtime.   pantoprazole 40 MG tablet Commonly known as:  PROTONIX Take 1 tablet (40 mg total) by mouth daily.   predniSONE 20 MG tablet Commonly known as:  DELTASONE Take 1 tablet (20 mg total) by mouth daily with breakfast.   rosuvastatin 5 MG tablet Commonly known as:  CRESTOR Take 1 tablet (5 mg total) by mouth daily at 6 PM. What changed:  when to take this   sodium chloride 0.65 % Soln nasal spray Commonly known as:  OCEAN Place 2 sprays into both nostrils 2 (two) times daily.            Durable Medical Equipment         Start     Ordered   11/13/16 0820  For home use only DME oxygen  Once    Comments:  Please arrange for portable oxygen concentrator  Question Answer Comment  Mode or (Route) Nasal cannula   Liters per Minute 3   Frequency Continuous (stationary and portable oxygen unit needed)   Oxygen delivery system Gas      11/13/16 0820        Disposition: Home.  No new home health needs identified prior to discharge.  Resume all home health services that were in place prior to admit.  Arranged for portable oxygen concentrator.    Discharged Condition: [name], MD has met maximum benefit of inpatient care and is medically stable and cleared for discharge.  Patient is pending follow up as above.      Time spent on disposition:  35 Minutes.   Signed: Noe Gens, NP-C [facility] Pgr: [phone] Office: [phone]

## 2016-11-13 NOTE — Consult Note (Signed)
   [facility] CM Inpatient Consult   11/13/2016  [name], MD [birth year] [id number]   Spoke with Mr. Alessio and son at bedside to discuss and offer [place] Management services.   Mr. Swim is extremely hard of hearing.   Patient's son states Mr. Quam' daughter is the person to speak with regarding [place] Management services.   Provided [facility] Care Management brochure, 24-hr nurse line magnet, and contact information to call should they desire [place] Management services.   Of note, patient goes to a Primary Care office [facility]) that performs transition of care calls post hospital discharge.  Left voicemail message for Horris Latino with [facility] to request that Primary Care office make referral to [place] [facility] if services are desired.   Will ask [facility] Care Management to remove patient from [place] calls because he is extremely hard of hearing.    Marthenia Rolling, MSN-Ed, RN,BSN [facility] Liaison [phone]

## 2016-11-13 NOTE — Consult Note (Signed)
   [facility] CM Inpatient Consult   11/13/2016  [name], MD 05/21/1925 [id number]    [name] Majestic to bedside to engage for [place] Management services.   Mr. Amory was actively being transferred off the unit.   Will follow up at later time.   Marthenia Rolling, MSN-Ed, RN,BSN [facility] Liaison [phone]

## 2016-11-13 NOTE — Progress Notes (Signed)
PULMONARY / CRITICAL CARE MEDICINE   Name: [name] Radin, MD MRN: [id number] DOB: August 25, 1925    ADMISSION DATE:  11/10/2016  CHIEF COMPLAINT: Short of breath  HISTORY OF PRESENT ILLNESS:   83 yo male former smoker seen in pulmonary office for dyspnea and worsening hypoxia after recent hospital admission for PNA, AECOPD, and anemia requiring transfusion.  He has decreased appetite and weight loss.  PMHx of CAD, DM, HLD, ischemic CM, s/p AICD, Macular degeneration, BPH, Beta thalassemia, MGUS IgA subtype.  SUBJECTIVE:  Feels well today, O2 demand stabilizing  VITAL SIGNS: BP (!) 134/51   Pulse 64   Temp (!) 97.3 F (36.3 C) (Oral)   Resp (!) 25   Ht 6' (1.829 m)   Wt 70.9 kg (156 lb 4.9 oz)   SpO2 94%   BMI 21.20 kg/m   INTAKE / OUTPUT: I/O last 3 completed shifts: In: 410 [P.O.:410] Out: 2700 [Urine:2700]  PHYSICAL EXAMINATION: . General - pleasant and in NAD HEENT: [place]/AT, PERRL, EOM-I and MMM Cardiac - RRR, Nl S1/S2, -M/R/G. Chest - Deceased BS at the bases Abd - Soft, NT, ND and +BS Ext - -edema and -tenderness Skin - Intact Neuro - Alert and interactive, moving all ext to command Psych - normal mood  LABS:  BMET  Recent Labs Lab 11/11/16 0638 11/12/16 0325 11/13/16 0345  NA 136 136 136  K 4.9 4.7 5.3*  CL 105 103 104  CO2 23 25 25   BUN 37* 42* 42*  CREATININE 1.20 1.12 1.18  GLUCOSE 120* 156* 134*   Electrolytes  Recent Labs Lab 11/10/16 1755 11/11/16 0638 11/12/16 0325 11/13/16 0345  CALCIUM 8.4* 8.2* 8.3* 8.1*  MG 2.1 2.1 2.1  --   PHOS 4.1 4.9* 4.0  --    CBC  Recent Labs Lab 11/11/16 0638 11/12/16 0325 11/13/16 0345  WBC 2.8* 3.1* 3.3*  HGB 8.2* 8.5* 8.1*  HCT 24.9* 25.7* 25.2*  PLT 45* 44* 32*   Sepsis Markers  Recent Labs Lab 11/10/16 1755 11/10/16 2033 11/11/16 0638  LATICACIDVEN 1.2 0.9  --   PROCALCITON 0.10  --  <0.10   Liver Enzymes  Recent Labs Lab 11/10/16 1755 11/12/16 0325  AST 19 16  ALT 23 20   ALKPHOS 44 39  BILITOT 0.5 0.6  ALBUMIN 3.3* 2.9*   Cardiac Enzymes  Recent Labs Lab 11/10/16 1755 11/10/16 2327 11/11/16 0638  TROPONINI 0.13* 0.10* 0.07*   Imaging No results found.  CULTURES: Blood 10/15 >>  Pneumococcal Ag 10/15 >> negative  ANTIBIOTICS: Vancomycin 10/15 >> 10/16  Fortaz 10/15 >> 10/16   SIGNIFICANT EVENTS: 10/15 Admit 10/16 Hematology consulted  I reviewed CXR myself, COPD noted.  ASSESSMENT / PLAN:  Acute on chronic respiratory failure with hypoxia. AECOPD. - Change outpatient O2 to 3L [place] from 2 - Continue brovana, pulmicort, prn albuterol, will discharge on that - Continue prednisone at 20 mg PO with breakfast and taper over the next week  Cough.  Likely from post nasal drip and reflux - Continue nasal irrigation, flonase and protonix  Hx of CAD, HTN, HLD, chronic diastolic CHF. - Continue ASA, plavix, crestor, lopressor  Iron deficiency anemia. Pancytopenia. Hx of Beta Thalassemia. - Continue FeSO4 - B12 replacement - F/u protein electrophoresis/immunofixation as outpatient - f/u CBC  Severe protein calorie malnutrition. Constipation. - Bowel regimen  BPH. - D/w pt concerns about using foley - In/Out cath as needed  Deconditioning. - PT consult  Daughter updated at length over the phone.  Spent over 45 on the phone trying to coordinate home care with advanced home care.  Daughter can not pick up patient today, will d/c in AM.  Rush Farmer, M.D. [facility] Pulmonary/Critical Care Medicine. Pager: [phone]. After hours pager: [phone].

## 2016-11-13 NOTE — Progress Notes (Signed)
IP PROGRESS NOTE  Subjective:   He reports feeling better.  Objective: Vital signs in last 24 hours: Blood pressure (!) 134/51, pulse 64, temperature (!) 97.2 F (36.2 C), temperature source Oral, resp. rate (!) 26, height 6' (1.829 m), weight 156 lb 4.9 oz (70.9 kg), SpO2 95 %.  Intake/Output from previous day: 10/17 0701 - 10/18 0700 In: -  Out: 1800 [Urine:1800]  Physical Exam: Not performed today    Lab Results:  Recent Labs  11/12/16 0325 11/13/16 0345  WBC 3.1* 3.3*  HGB 8.5* 8.1*  HCT 25.7* 25.2*  PLT 44* 32*    BMET  Recent Labs  11/12/16 0325 11/13/16 0345  NA 136 136  K 4.7 5.3*  CL 103 104  CO2 25 25  GLUCOSE 156* 134*  BUN 42* 42*  CREATININE 1.12 1.18  CALCIUM 8.3* 8.1*     Medications: I have reviewed the patient's current medications.  Assessment/Plan: 1. Pancytopenia 2. History of beta thalassemia trait 3. History of vitamin B-12 deficiency-vitamin B-12 replacement initiated 11/11/2016 4. 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 COPD 6. History of coronary artery disease 7. Ischemic cardiomyopathy 8. ICD in place 9. Prostatic hypertrophy  He appears stable. The myeloma panel is pending. We will arrange for outpatient vitamin B 12 therapy and follow-up at the [facility]. We will plan for a bone marrow biopsy if the pancytopenia does not improve with B-12 replacement.  Recommendations: 1. Transfuse packed red blood cells for symptomatic anemia 2. Continue weekly parenteral vitamin B-12 for 4 doses, then switch to oral B-12 replacement 3. Consider holding aspirin plus/minus Plavix if cardiology feels this is appropriate 4. Outpatient follow-up in the Hematology clinic will be scheduled.  Please call Hematology as needed  LOS: 3 days   Donneta Romberg, MD   11/13/2016, 3:38 PM

## 2016-11-14 DIAGNOSIS — R5381 Other malaise: Secondary | ICD-10-CM

## 2016-11-14 LAB — GLUCOSE, CAPILLARY: GLUCOSE-CAPILLARY: 189 mg/dL — AB (ref 65–99)

## 2016-11-14 MED ORDER — FERROUS SULFATE 325 (65 FE) MG PO TABS: 325.0000 mg | ORAL_TABLET | Freq: Two times a day (BID) | ORAL | 0 refills | Status: DC

## 2016-11-14 MED ORDER — FLUTICASONE PROPIONATE 50 MCG/ACT NA SUSP: 1.0000 | Freq: Every day | NASAL | 2 refills | Status: DC

## 2016-11-14 MED ORDER — ASCORBIC ACID 250 MG PO TABS: 250.0000 mg | ORAL_TABLET | Freq: Two times a day (BID) | ORAL | 0 refills | Status: DC

## 2016-11-14 MED ORDER — PREDNISONE 20 MG PO TABS: 20.0000 mg | ORAL_TABLET | Freq: Every day | ORAL | 0 refills | Status: DC

## 2016-11-14 MED ORDER — SALINE SPRAY 0.65 % NA SOLN: 2.0000 | Freq: Two times a day (BID) | NASAL | 0 refills | Status: AC

## 2016-11-14 MED ORDER — PANTOPRAZOLE SODIUM 40 MG PO TBEC: 40.0000 mg | DELAYED_RELEASE_TABLET | Freq: Every day | ORAL | 0 refills | Status: DC

## 2016-11-14 NOTE — Care Management Important Message (Signed)
Important Message  Patient Details  Name: [name] Ohalloran, [name] [name] MRN: [id number] Date of Birth: Feb 25, 1925   Medicare Important Message Given:  Yes    Kerin Salen 11/14/2016, 12:21 [place] Message  Patient Details  Name: [name] Tinkey, [name] [name] MRN: [id number] Date of Birth: 03-Mar-1925   Medicare Important Message Given:  Yes    Kerin Salen 11/14/2016, 12:21 PM

## 2016-11-14 NOTE — Progress Notes (Signed)
Date:  November 14, 2016 Chart reviewed for concurrent status and case management needs.  Will continue to follow patient progress.  Discharge Planning: following for needs  Expected discharge date: [id number]  Velva Harman, BSN, [place], [place]

## 2016-11-14 NOTE — Progress Notes (Signed)
Physical Therapy Treatment Patient Details Name: [name] Caiazzo, MD MRN: [id number] DOB: Jul 25, 1925 Today's Date: 11/14/2016    History of Present Illness Pt is a 83 y/o male admited with resp failure 2* PNA.  PMH includes DM, CKD, MI, COPD, CAD, CHF, and ischemic cardiomyopathy.     PT Comments    Pt in recliner on 3 lts with Mod cough/mucous congestion.  Assisted with amb in hallway an increased distance.  Avg sats on 3 lts 96%.  Tolerated well.  Appears close to prior mobility level.    Follow Up Recommendations  Home health PT;Supervision/Assistance - 24 hour     Equipment Recommendations  None recommended by PT    Recommendations for Other Services       Precautions / Restrictions Precautions Precautions: Fall Precaution Comments: Home O2 @ 2.5 L Restrictions Weight Bearing Restrictions: No    Mobility  Bed Mobility               General bed mobility comments: OOB in recliner  Transfers Overall transfer level: Needs assistance Equipment used: Rolling walker (2 wheeled) Transfers: Sit to/from [facility] Sit to Stand: Min guard Stand pivot transfers: Min guard       General transfer comment: min guard for safety; cues for safe hand placement  Ambulation/Gait Ambulation/Gait assistance: Min guard Ambulation Distance (Feet): 172 Feet Assistive device: Rolling walker (2 wheeled) Gait Pattern/deviations: Step-through pattern;Decreased step length - right;Decreased step length - left;Trunk flexed Gait velocity: decreased   General Gait Details: cues for posture and position from RW.  Three standing rest breaks to complete task but maintained SaO2 at 93-100% on 3L O2  fprward flex posture   Stairs            Wheelchair Mobility    Modified Rankin (Stroke Patients Only)       Balance                                            Cognition Arousal/Alertness: Awake/alert Behavior During Therapy: WFL for  tasks assessed/performed Overall Cognitive Status: Within Functional Limits for tasks assessed                                 General Comments: HOH      Exercises      General Comments        Pertinent Vitals/Pain Pain Assessment: No/denies pain    Home Living                      Prior Function            PT Goals (current goals can now be found in the care plan section) Progress towards PT goals: Progressing toward goals    Frequency    Min 3X/week      PT Plan Current plan remains appropriate    Co-evaluation              AM-PAC PT "6 Clicks" Daily Activity  Outcome Measure  Difficulty turning over in bed (including adjusting bedclothes, sheets and blankets)?: A Little Difficulty moving from lying on back to sitting on the side of the bed? : A Lot Difficulty sitting down on and standing up from a chair with arms (e.g., wheelchair, bedside commode, etc,.)?: A Lot Help needed moving  to and from a bed to chair (including a wheelchair)?: A Little Help needed walking in hospital room?: A Little Help needed climbing 3-5 steps with a railing? : A Lot 6 Click Score: 15    End of Session Equipment Utilized During Treatment: Gait belt;Oxygen Activity Tolerance: Patient tolerated treatment well;Patient limited by fatigue Patient left: in chair;with call bell/phone within reach;with family/visitor present Nurse Communication: Mobility status PT Visit Diagnosis: Unsteadiness on feet (R26.81);Muscle weakness (generalized) (M62.81);Difficulty in walking, not elsewhere classified (R26.2)     Time: 2111-7356 PT Time Calculation (min) (ACUTE ONLY): 22 min  Charges:  $Gait Training: 8-22 mins                    G Codes:     Rica Koyanagi  PTA [facility]  Acute  Rehab Pager      [phone]

## 2016-11-15 LAB — CULTURE, BLOOD (ROUTINE X 2)
CULTURE: NO GROWTH
Culture: NO GROWTH
Special Requests: ADEQUATE
Special Requests: ADEQUATE

## 2016-11-16 DIAGNOSIS — J441 Chronic obstructive pulmonary disease with (acute) exacerbation: Secondary | ICD-10-CM | POA: Diagnosis not present

## 2016-11-16 DIAGNOSIS — I130 Hypertensive heart and chronic kidney disease with heart failure and stage 1 through stage 4 chronic kidney disease, or unspecified chronic kidney disease: Secondary | ICD-10-CM | POA: Diagnosis not present

## 2016-11-16 DIAGNOSIS — J189 Pneumonia, unspecified organism: Secondary | ICD-10-CM | POA: Diagnosis not present

## 2016-11-16 DIAGNOSIS — I255 Ischemic cardiomyopathy: Secondary | ICD-10-CM | POA: Diagnosis not present

## 2016-11-16 DIAGNOSIS — J440 Chronic obstructive pulmonary disease with acute lower respiratory infection: Secondary | ICD-10-CM | POA: Diagnosis not present

## 2016-11-16 DIAGNOSIS — I2510 Atherosclerotic heart disease of native coronary artery without angina pectoris: Secondary | ICD-10-CM | POA: Diagnosis not present

## 2016-11-17 ENCOUNTER — Telehealth: Payer: Self-pay | Admitting: Internal Medicine

## 2016-11-17 DIAGNOSIS — J440 Chronic obstructive pulmonary disease with acute lower respiratory infection: Secondary | ICD-10-CM | POA: Diagnosis not present

## 2016-11-17 DIAGNOSIS — J189 Pneumonia, unspecified organism: Secondary | ICD-10-CM | POA: Diagnosis not present

## 2016-11-17 DIAGNOSIS — I130 Hypertensive heart and chronic kidney disease with heart failure and stage 1 through stage 4 chronic kidney disease, or unspecified chronic kidney disease: Secondary | ICD-10-CM | POA: Diagnosis not present

## 2016-11-17 DIAGNOSIS — J441 Chronic obstructive pulmonary disease with (acute) exacerbation: Secondary | ICD-10-CM | POA: Diagnosis not present

## 2016-11-17 DIAGNOSIS — I255 Ischemic cardiomyopathy: Secondary | ICD-10-CM | POA: Diagnosis not present

## 2016-11-17 DIAGNOSIS — I2510 Atherosclerotic heart disease of native coronary artery without angina pectoris: Secondary | ICD-10-CM | POA: Diagnosis not present

## 2016-11-17 LAB — MULTIPLE MYELOMA PANEL, SERUM
ALBUMIN/GLOB SERPL: 1.1 (ref 0.7–1.7)
ALPHA 1: 0.3 g/dL (ref 0.0–0.4)
Albumin SerPl Elph-Mcnc: 3.1 g/dL (ref 2.9–4.4)
Alpha2 Glob SerPl Elph-Mcnc: 0.7 g/dL (ref 0.4–1.0)
B-Globulin SerPl Elph-Mcnc: 1.8 g/dL — ABNORMAL HIGH (ref 0.7–1.3)
Gamma Glob SerPl Elph-Mcnc: 0.3 g/dL — ABNORMAL LOW (ref 0.4–1.8)
Globulin, Total: 3 g/dL (ref 2.2–3.9)
IGA: 934 mg/dL — AB (ref 61–437)
IGM (IMMUNOGLOBULIN M), SRM: 8 mg/dL — AB (ref 15–143)
IgG (Immunoglobin G), Serum: 329 mg/dL — ABNORMAL LOW (ref 700–1600)
M Protein SerPl Elph-Mcnc: 0.9 g/dL — ABNORMAL HIGH
Total Protein ELP: 6.1 g/dL (ref 6.0–8.5)

## 2016-11-17 NOTE — Telephone Encounter (Signed)
New message    Pt is calling about home monitor. They have called the 800 number and cant get through. Please call.

## 2016-11-17 NOTE — Telephone Encounter (Signed)
Spoke with pt, informed pt to press the white button again and sit by the monitor until the stars light up if the stars do not light up in about 15-58min then he should call the 800 number first thing in the morning. Pt voiced understanding.

## 2016-11-18 ENCOUNTER — Ambulatory Visit: Payer: Medicare Other

## 2016-11-18 ENCOUNTER — Telehealth: Payer: Self-pay | Admitting: *Deleted

## 2016-11-18 ENCOUNTER — Ambulatory Visit (HOSPITAL_COMMUNITY)
Admission: RE | Admit: 2016-11-18 | Discharge: 2016-11-18 | Disposition: A | Discharge status: Home / Self Care | Payer: Medicare Other | Source: Ambulatory Visit | Attending: Oncology | Admitting: Oncology

## 2016-11-18 ENCOUNTER — Other Ambulatory Visit: Payer: Self-pay | Admitting: *Deleted

## 2016-11-18 ENCOUNTER — Other Ambulatory Visit: Payer: Self-pay | Admitting: Nurse Practitioner

## 2016-11-18 ENCOUNTER — Ambulatory Visit: Payer: Medicare Other | Admitting: Nurse Practitioner

## 2016-11-18 DIAGNOSIS — D696 Thrombocytopenia, unspecified: Secondary | ICD-10-CM

## 2016-11-18 DIAGNOSIS — D649 Anemia, unspecified: Secondary | ICD-10-CM

## 2016-11-18 DIAGNOSIS — I255 Ischemic cardiomyopathy: Secondary | ICD-10-CM | POA: Diagnosis not present

## 2016-11-18 DIAGNOSIS — J189 Pneumonia, unspecified organism: Secondary | ICD-10-CM | POA: Diagnosis not present

## 2016-11-18 DIAGNOSIS — J441 Chronic obstructive pulmonary disease with (acute) exacerbation: Secondary | ICD-10-CM | POA: Diagnosis not present

## 2016-11-18 DIAGNOSIS — I2510 Atherosclerotic heart disease of native coronary artery without angina pectoris: Secondary | ICD-10-CM | POA: Diagnosis not present

## 2016-11-18 DIAGNOSIS — J440 Chronic obstructive pulmonary disease with acute lower respiratory infection: Secondary | ICD-10-CM | POA: Diagnosis not present

## 2016-11-18 DIAGNOSIS — I130 Hypertensive heart and chronic kidney disease with heart failure and stage 1 through stage 4 chronic kidney disease, or unspecified chronic kidney disease: Secondary | ICD-10-CM | POA: Diagnosis not present

## 2016-11-18 NOTE — Telephone Encounter (Signed)
Spoke with pt's daughter, she can not arrange for him to be here until after 3PM. She is planning to bring him in tomorrow. Informed her we will check lab on 10/24. Will schedule office visit for 10/29 at 3:15. Daughter voiced understanding.

## 2016-11-19 ENCOUNTER — Other Ambulatory Visit (HOSPITAL_BASED_OUTPATIENT_CLINIC_OR_DEPARTMENT_OTHER): Payer: Medicare Other

## 2016-11-19 ENCOUNTER — Ambulatory Visit (HOSPITAL_BASED_OUTPATIENT_CLINIC_OR_DEPARTMENT_OTHER): Payer: Medicare Other

## 2016-11-19 VITALS — BP 115/54 | HR 60 | Temp 98.0°F | Resp 20

## 2016-11-19 DIAGNOSIS — E538 Deficiency of other specified B group vitamins: Secondary | ICD-10-CM

## 2016-11-19 LAB — CBC WITH DIFFERENTIAL/PLATELET
BASO%: 0 % (ref 0.0–2.0)
BASOS ABS: 0 10*3/uL (ref 0.0–0.1)
EOS%: 0.7 % (ref 0.0–7.0)
Eosinophils Absolute: 0 10*3/uL (ref 0.0–0.5)
HCT: 28.3 % — ABNORMAL LOW (ref 38.4–49.9)
HGB: 8.9 g/dL — ABNORMAL LOW (ref 13.0–17.1)
LYMPH#: 0.6 10*3/uL — AB (ref 0.9–3.3)
LYMPH%: 13.3 % — ABNORMAL LOW (ref 14.0–49.0)
MCH: 22.8 pg — ABNORMAL LOW (ref 27.2–33.4)
MCHC: 31.4 g/dL — ABNORMAL LOW (ref 32.0–36.0)
MCV: 72.6 fL — ABNORMAL LOW (ref 79.3–98.0)
MONO#: 0.2 10*3/uL (ref 0.1–0.9)
MONO%: 4.8 % (ref 0.0–14.0)
NEUT%: 81.2 % — ABNORMAL HIGH (ref 39.0–75.0)
NEUTROS ABS: 3.7 10*3/uL (ref 1.5–6.5)
NRBC: 0 % (ref 0–0)
Platelets: 58 10*3/uL — ABNORMAL LOW (ref 140–400)
RBC: 3.9 10*6/uL — AB (ref 4.20–5.82)
RDW: 20 % — AB (ref 11.0–14.6)
WBC: 4.6 10*3/uL (ref 4.0–10.3)

## 2016-11-19 LAB — TECHNOLOGIST REVIEW

## 2016-11-19 MED ORDER — CYANOCOBALAMIN 1000 MCG/ML IJ SOLN
INTRAMUSCULAR | Status: AC
  Filled 2016-11-19: qty 1

## 2016-11-19 MED ORDER — CYANOCOBALAMIN 1000 MCG/ML IJ SOLN
1000.0000 ug | Freq: Once | INTRAMUSCULAR | Status: AC
  Administered 2016-11-19: 1000 ug via INTRAMUSCULAR

## 2016-11-20 ENCOUNTER — Inpatient Hospital Stay: Payer: Medicare Other | Admitting: Pulmonary Disease

## 2016-11-20 NOTE — Progress Notes (Signed)
No ICM remote transmission received for 11/13/2016 due to hospitalization and next ICM transmission scheduled for 12/01/2016.

## 2016-11-21 DIAGNOSIS — J441 Chronic obstructive pulmonary disease with (acute) exacerbation: Secondary | ICD-10-CM | POA: Diagnosis not present

## 2016-11-21 DIAGNOSIS — I130 Hypertensive heart and chronic kidney disease with heart failure and stage 1 through stage 4 chronic kidney disease, or unspecified chronic kidney disease: Secondary | ICD-10-CM | POA: Diagnosis not present

## 2016-11-21 DIAGNOSIS — I2510 Atherosclerotic heart disease of native coronary artery without angina pectoris: Secondary | ICD-10-CM | POA: Diagnosis not present

## 2016-11-21 DIAGNOSIS — J440 Chronic obstructive pulmonary disease with acute lower respiratory infection: Secondary | ICD-10-CM | POA: Diagnosis not present

## 2016-11-21 DIAGNOSIS — I255 Ischemic cardiomyopathy: Secondary | ICD-10-CM | POA: Diagnosis not present

## 2016-11-21 DIAGNOSIS — J189 Pneumonia, unspecified organism: Secondary | ICD-10-CM | POA: Diagnosis not present

## 2016-11-24 ENCOUNTER — Ambulatory Visit (HOSPITAL_BASED_OUTPATIENT_CLINIC_OR_DEPARTMENT_OTHER): Payer: Medicare Other | Admitting: Oncology

## 2016-11-24 ENCOUNTER — Ambulatory Visit (HOSPITAL_BASED_OUTPATIENT_CLINIC_OR_DEPARTMENT_OTHER): Payer: Medicare Other

## 2016-11-24 ENCOUNTER — Telehealth: Payer: Self-pay | Admitting: Oncology

## 2016-11-24 VITALS — BP 120/64 | HR 60 | Temp 98.4°F | Resp 19 | Ht 72.0 in | Wt 161.2 lb

## 2016-11-24 DIAGNOSIS — I2510 Atherosclerotic heart disease of native coronary artery without angina pectoris: Secondary | ICD-10-CM | POA: Diagnosis not present

## 2016-11-24 DIAGNOSIS — D61818 Other pancytopenia: Secondary | ICD-10-CM

## 2016-11-24 DIAGNOSIS — D561 Beta thalassemia: Secondary | ICD-10-CM

## 2016-11-24 DIAGNOSIS — E538 Deficiency of other specified B group vitamins: Secondary | ICD-10-CM | POA: Diagnosis not present

## 2016-11-24 DIAGNOSIS — J440 Chronic obstructive pulmonary disease with acute lower respiratory infection: Secondary | ICD-10-CM | POA: Diagnosis not present

## 2016-11-24 DIAGNOSIS — D509 Iron deficiency anemia, unspecified: Secondary | ICD-10-CM

## 2016-11-24 DIAGNOSIS — J189 Pneumonia, unspecified organism: Secondary | ICD-10-CM | POA: Diagnosis not present

## 2016-11-24 DIAGNOSIS — I255 Ischemic cardiomyopathy: Secondary | ICD-10-CM | POA: Diagnosis not present

## 2016-11-24 DIAGNOSIS — J441 Chronic obstructive pulmonary disease with (acute) exacerbation: Secondary | ICD-10-CM | POA: Diagnosis not present

## 2016-11-24 DIAGNOSIS — I130 Hypertensive heart and chronic kidney disease with heart failure and stage 1 through stage 4 chronic kidney disease, or unspecified chronic kidney disease: Secondary | ICD-10-CM | POA: Diagnosis not present

## 2016-11-24 DIAGNOSIS — D649 Anemia, unspecified: Secondary | ICD-10-CM

## 2016-11-24 LAB — CBC WITH DIFFERENTIAL/PLATELET
BASO%: 0.2 % (ref 0.0–2.0)
Basophils Absolute: 0 10*3/uL (ref 0.0–0.1)
EOS%: 0.5 % (ref 0.0–7.0)
Eosinophils Absolute: 0 10*3/uL (ref 0.0–0.5)
HCT: 24.1 % — ABNORMAL LOW (ref 38.4–49.9)
HEMOGLOBIN: 7.5 g/dL — AB (ref 13.0–17.1)
LYMPH%: 12.1 % — AB (ref 14.0–49.0)
MCH: 22.6 pg — AB (ref 27.2–33.4)
MCHC: 31.1 g/dL — AB (ref 32.0–36.0)
MCV: 72.6 fL — ABNORMAL LOW (ref 79.3–98.0)
MONO#: 0.1 10*3/uL (ref 0.1–0.9)
MONO%: 2.5 % (ref 0.0–14.0)
NEUT#: 3.4 10*3/uL (ref 1.5–6.5)
NEUT%: 84.7 % — AB (ref 39.0–75.0)
Platelets: 45 10*3/uL — ABNORMAL LOW (ref 140–400)
RBC: 3.32 10*6/uL — ABNORMAL LOW (ref 4.20–5.82)
RDW: 19.1 % — ABNORMAL HIGH (ref 11.0–14.6)
WBC: 4.1 10*3/uL (ref 4.0–10.3)
lymph#: 0.5 10*3/uL — ABNORMAL LOW (ref 0.9–3.3)
nRBC: 0 % (ref 0–0)

## 2016-11-24 MED ORDER — CYANOCOBALAMIN 1000 MCG/ML IJ SOLN
1000.0000 ug | Freq: Once | INTRAMUSCULAR | Status: AC
  Administered 2016-11-24: 1000 ug via INTRAMUSCULAR

## 2016-11-24 NOTE — Telephone Encounter (Signed)
Gave patient/relative avs report and appointments for November. Patient has existing injection appointment already on 11/6 and requested this date remain as is - added lab for 11/6 w/injection. Central radiology will call re bx.

## 2016-11-24 NOTE — Progress Notes (Signed)
  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 He was found to have pancytopenia. The vitamin B-12 level was low. He was started on vitamin B-12 replacement. He complains of exertional dyspnea. No bleeding.  Objective:  Vital signs in last 24 hours:  Blood pressure 120/64, pulse 60, temperature 98.4 F (36.9 C), temperature source Oral, resp. rate 19, height 6' (1.829 m), weight 161 lb 3.2 oz (73.1 kg), SpO2 100 %.    HEENT: No thrush Resp: Distant breath sounds, scattered wheezes Cardio: Distant heart sounds, regular rhythm GI: No hepatosplenomegaly    Lab Results:  Lab Results  Component Value Date   WBC 4.6 11/19/2016   HGB 8.9 (L) 11/19/2016   HCT 28.3 (L) 11/19/2016   MCV 72.6 (L) 11/19/2016   PLT 58 (L) 11/19/2016   NEUTROABS 3.7 11/19/2016    CMP     Component Value Date/Time   NA 136 11/13/2016 0345   K 5.3 (H) 11/13/2016 0345   CL 104 11/13/2016 0345   CO2 25 11/13/2016 0345   GLUCOSE 134 (H) 11/13/2016 0345   BUN 42 (H) 11/13/2016 0345   CREATININE 1.18 11/13/2016 0345   CREATININE 1.48 (H) 06/06/2015 1629   CALCIUM 8.1 (L) 11/13/2016 0345   PROT 5.8 (L) 11/12/2016 0325   ALBUMIN 2.9 (L) 11/12/2016 0325   AST 16 11/12/2016 0325   ALT 20 11/12/2016 0325   ALKPHOS 39 11/12/2016 0325   BILITOT 0.6 11/12/2016 0325   GFRNONAA 53 (L) 11/13/2016 0345   GFRAA >60 11/13/2016 0345   11/11/2016: IgA 934, IgG 329, kappa free light chains to 221, serum M spike 0.9  Medications: I have reviewed the patient's current medications.  Assessment/Plan: 1. Pancytopenia 2. History of beta thalassemia trait 3. History of vitamin B-12 deficiency-vitamin B-12 replacement initiated 11/11/2016 4. History of a serum monoclonal IgA kappa protein-elevated serum free kappa light chains , IgA, and M spike 11/11/2016 5. COPD 6. History of  coronary artery disease 7. Ischemic cardiomyopathy 8. ICD in place 9. 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 He has persistent pancytopenia. The pancytopenia is likely multifactorial including a component related to thalassemia and vitamin B 12 deficiency. He has a long-standing serum M spike and may have evolved to multiple myeloma. The differential diagnosis also includes myelodysplasia. I recommend proceeding with a diagnostic bone marrow biopsy.  We will arrange for the bone marrow biopsy within the next 2 weeks. We will repeat a CBC when he is here for vitamin B-12 next week and arrange for transfusion support if the hemoglobin is lower.  I will see him for an office visit in approximately 3 weeks.  25 minutes were spent with the patient today. The majority of the time was used for counseling and coordination of care.  Donneta Romberg, MD  11/24/2016  4:09 PM

## 2016-11-25 ENCOUNTER — Inpatient Hospital Stay: Payer: Medicare Other | Admitting: Pulmonary Disease

## 2016-11-25 ENCOUNTER — Ambulatory Visit: Payer: Medicare Other

## 2016-11-26 ENCOUNTER — Telehealth: Payer: Self-pay | Admitting: *Deleted

## 2016-11-26 DIAGNOSIS — I255 Ischemic cardiomyopathy: Secondary | ICD-10-CM | POA: Diagnosis not present

## 2016-11-26 DIAGNOSIS — I2510 Atherosclerotic heart disease of native coronary artery without angina pectoris: Secondary | ICD-10-CM | POA: Diagnosis not present

## 2016-11-26 DIAGNOSIS — J440 Chronic obstructive pulmonary disease with acute lower respiratory infection: Secondary | ICD-10-CM | POA: Diagnosis not present

## 2016-11-26 DIAGNOSIS — J189 Pneumonia, unspecified organism: Secondary | ICD-10-CM | POA: Diagnosis not present

## 2016-11-26 DIAGNOSIS — D509 Iron deficiency anemia, unspecified: Secondary | ICD-10-CM

## 2016-11-26 DIAGNOSIS — J441 Chronic obstructive pulmonary disease with (acute) exacerbation: Secondary | ICD-10-CM | POA: Diagnosis not present

## 2016-11-26 DIAGNOSIS — I130 Hypertensive heart and chronic kidney disease with heart failure and stage 1 through stage 4 chronic kidney disease, or unspecified chronic kidney disease: Secondary | ICD-10-CM | POA: Diagnosis not present

## 2016-11-26 NOTE — Progress Notes (Deleted)
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   MRN: [id number]  [facility].:  Follow-up after hospitalization with Acute on Chronic Hypoxic Respiratory Failure & Left Upper Lobe Pneumonia with known Mixed Type COPD.   HPI Left upper lobe pneumonia: Hospitalized and treated for pneumonia prior to last office visit with Rocephin & azithromycin. Patient was subsequently discharged on 10/7. Patient hospitalized on 10/15 with office visit & started on empiric Fortaz and Vancomycin. Blood cultures negative on admission. Urine streptococcal and legionella antigens negative on admission. Portable chest x-ray on admission did not demonstrate any clear left upper lobe opacity. Antibiotics subsequently discontinued.  Mixed type COPD: At last office appointment patient was tapering on prednisone & previously prescribed Brovana and Pulmicort. Patient continued on prednisone taper at discharge from hospital on 10/19. Continued on nebulizer therapies upon discharge.  Acute on chronic hypoxic respiratory failure: Patient requiring increased oxygen supplementation at last appointment. Prescribed oxygen at 2 L/m while sleeping as well as with exertion. Continued on oxygen at 3 L/m at discharge and portable oxygen concentrator also ordered from DME company [facility]).  Review of Systems ***  No Known Allergies  Current Outpatient Prescriptions on File Prior to Visit  Medication Sig Dispense Refill  . albuterol (PROVENTIL) (2.5 MG/3ML) 0.083% nebulizer solution Take 2.5 mg by nebulization every 4 (four) hours as needed for wheezing or shortness of breath.    . ALPRAZolam (XANAX) 0.25 MG tablet Take 0.25 mg by mouth at bedtime as needed for sleep.     Marland Kitchen arformoterol (BROVANA) 15 MCG/2ML NEBU Take 15 mcg by nebulization 2 (two) times daily.    Marland Kitchen aspirin EC 81 MG tablet Take 1 tablet (81 mg total) by mouth at bedtime. 30 tablet 0  . bisacodyl (DULCOLAX) 5 MG EC tablet Take 5 mg by mouth  daily as needed for moderate constipation.    . budesonide (PULMICORT) 0.5 MG/2ML nebulizer solution Take 2 mLs (0.5 mg total) by nebulization 2 (two) times daily. 120 mL 3  . clopidogrel (PLAVIX) 75 MG tablet Take 1 tablet (75 mg total) by mouth daily. 90 tablet 1  . docusate sodium (COLACE) 100 MG capsule Take 100 mg by mouth 2 (two) times daily as needed for mild constipation.    . ferrous sulfate 325 (65 FE) MG tablet Take 1 tablet (325 mg total) by mouth 2 (two) times daily with a meal. 60 tablet 0  . fluticasone (FLONASE) 50 MCG/ACT nasal spray Place 1 spray into both nostrils daily. 16 g 2  . guaiFENesin (MUCINEX) 600 MG 12 hr tablet Take 600 mg by mouth 2 (two) times daily as needed for cough or to loosen phlegm.     . metoprolol tartrate (LOPRESSOR) 25 MG tablet Take 12.5 mg by mouth 2 (two) times daily. Reported on 06/06/2015    . nitroGLYCERIN (NITROSTAT) 0.4 MG SL tablet Place 1 tablet (0.4 mg total) under the tongue every 5 (five) minutes as needed for chest pain. UP TO 3 DOSES THEN CALL EMS 30 tablet 1  . OXYGEN Inhale 2 L into the lungs at bedtime.     . pantoprazole (PROTONIX) 40 MG tablet Take 1 tablet (40 mg total) by mouth daily. 30 tablet 0  . predniSONE (DELTASONE) 20 MG tablet Take 1 tablet (20 mg total) by mouth daily with breakfast. 6 tablet 0  . rosuvastatin (CRESTOR) 5 MG tablet Take 1 tablet (5 mg total) by mouth daily at 6 PM. (  Patient taking differently: Take 5 mg by mouth daily. ) 90 tablet 3  . sodium chloride (OCEAN) 0.65 % SOLN nasal spray Place 2 sprays into both nostrils 2 (two) times daily.  0  . vitamin C (VITAMIN C) 250 MG tablet Take 1 tablet (250 mg total) by mouth 2 (two) times daily. 60 tablet 0   No current facility-administered medications on file prior to visit.     Past Medical History:  Diagnosis Date  . Anemia, unspecified   . CAD (coronary artery disease)   . Cataracts, bilateral   . CHF (congestive heart failure) ([place])   . Chronic airway  obstruction, not elsewhere classified    on o2 at night  . Diabetes mellitus   . Diverticulosis of colon (without mention of hemorrhage)   . Dyslipidemia   . Emphysema   . Hemorrhoids   . ICD (implantable cardiac defibrillator) in place    st jude  . Ischemic cardiomyopathy   . Macular degeneration   . MI, old 2010 or 2011  . Prostatic hypertrophy    hx of  . Retention of urine, unspecified   . Unspecified disorder resulting from impaired renal function     Past Surgical History:  Procedure Laterality Date  . CARDIAC DEFIBRILLATOR PLACEMENT  2011   [facility] jude  . CATARACT EXTRACTION    . PACEMAKER INSERTION  2011  . pci     4/11  . TONSILLECTOMY  83 yo    Family History  Problem Relation Age of Onset  . Coronary artery disease Mother   . Stroke Mother   . Congestive Heart Failure Mother   . Lung cancer Father        smoker  . Breast cancer Sister   . Atrial fibrillation Daughter   . Heart attack Brother     Social History   Social History  . Marital status: Widowed    Spouse name: N/A  . Number of children: N/A  . 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 Smoking status: Former Smoker    Types: Cigarettes    Quit date: 04/27/2009  . Smokeless tobacco: Never Used  . Alcohol use Yes     Comment: occasional beer  . Drug use: No  . Sexual activity: No   Other Topics Concern  . Not on file   Social History Narrative   Lives in a house with stairs, which he needs to use, with his wife.  Wife is not able-bodied.  Does not use a cane or walker, but is unsteady on his feet.        [name] Gullion:  [phone] cell, [phone], daughter, POA.     Boyd Kerbs:  [phone], nephew   Juluis Pitch:  [phone], daughter               Objective:   Physical Exam There were no vitals taken for this visit.  General:  Awake. Alert. No acute distress. Sitting watching TV. Family at bedside.    Integument:  Warm & dry. No rash on exposed skin. No bruising. Extremities:  No cyanosis or clubbing.  HEENT:  Moist mucus membranes. No oral ulcers. No scleral injection or icterus. Endotracheal tube in place. PERRL. Cardiovascular:  Regular rate. No edema. No appreciable JVD.  Pulmonary:  Good aeration & clear to auscultation bilaterally. Symmetric chest wall expansion. No accessory muscle use. Abdomen: Soft. Normal bowel sounds. Nondistended.  Grossly nontender. Musculoskeletal:  Normal bulk and tone. Hand grip strength 5/5 bilaterally. No joint deformity or effusion appreciated. Neurological:  Cranial nerves 2-12 grossly in tact. No meningismus. Moving all 4 extremities equally.   *** General:  Awake. Alert. No acute distress. Sitting in scooter. Daughter present.  Integument:  Warm. Dry. No rash on exposed skin. Extremities:  No cyanosis or clubbing.  Lymphatics:  No appreciated cervical or supraclavicular lymphadenoapthy. HEENT:  Dry mucus membranes. No scleral injection or icterus.  Cardiovascular:  Regular rate. Trace bilateral lower extremity edema. Unable to appreciate JVD.  Pulmonary:  Symmetrically decreased breath sounds. Mild to moderately increased work of breathing on previous per minute via nasal cannula. Abdomen: Soft. Normal bowel sounds. Protuberant. Musculoskeletal:  Normal bulk and tone. No joint deformity or effusion appreciated. Neurological:  CN 2-12 grossly in tact. Very hard of hearing. No meningismus. Moving all 4 extremities equally. Symmetric brachioradialis deep tendon reflexes. Psychiatric:  Mood and affect congruent.    IMAGING PORT CXR 11/10/16 (personally reviewed by me):  No clear left upper lobe opacity. Hyperinflation suggested by deep sulci bilaterally. No pleural effusion. Heart normal in size & mediastinum normal in contour. Implanted device within left anterior chest. Leads projecting from device to heart.  PORT CXR 10/29/16 (previously reviewed by  me):  Hyperinflation suggested by flattening of the diaphragms. Left upper lung peripheral patchy opacification. No pleural effusion appreciated. Implanted device within left anterior chest.  MICROBIOLOGY Respiratory Viral Panel PCR 10/30/16:  Negative  Urine Streptococcal Antigen 10/29/16:  Negative  Blood Cultures x4 10/29/16:  Negative     Assessment & Plan:  83 y.o.   male recently hospitalized for left upper lobe pneumonia. Returns today for acute on chronic hypoxic respiratory failure with worsening oxygenation over the last 48 hours. Patient has been adherent to his nebulizer regimen and I detect no wheezing on exam today raising concern for a new infectious process. Certainly, a healthcare associated pneumonia is possible.  1. Left upper lobe pneumonia: Resolved on repeat chest x-ray imaging. 2. Acute on chronic hypoxic respiratory failure: 3. Mixed type COPD: 4. Follow-up: Return to clinic in  5. Acute on chronic hypoxic respiratory failure: Checking serum workup with BNP & CBC with differential along with a stat portable CXR. 6. Left upper lobe pneumonia: Recently treated with Rocephin and azithromycin. Starting broad-spectrum antibiotics with vancomycin and Fortaz after blood cultures are obtained. Further infectious workup with urine Legionella antigen, urine streptococcal antigen, and portable chest x-ray imaging. 7. Disposition: Admitting patient directly to stepdown unit. Further plan of care as per admission H and P.  Javia Dillow E. Ashok Cordia, M.D. [facility] Pager:  [phone] After 7pm or if no response, call [phone] 11:14 PM 11/26/16

## 2016-11-26 NOTE — Telephone Encounter (Signed)
Message from radiology requesting additional order for bone marrow biopsy. Reviewed with Dr. [name], order received and entered.

## 2016-11-27 ENCOUNTER — Inpatient Hospital Stay (HOSPITAL_COMMUNITY): Payer: Medicare Other

## 2016-11-27 ENCOUNTER — Encounter: Payer: Self-pay | Admitting: Adult Health

## 2016-11-27 ENCOUNTER — Ambulatory Visit (INDEPENDENT_AMBULATORY_CARE_PROVIDER_SITE_OTHER): Payer: Medicare Other | Admitting: Adult Health

## 2016-11-27 ENCOUNTER — Other Ambulatory Visit: Payer: Self-pay | Admitting: Oncology

## 2016-11-27 ENCOUNTER — Inpatient Hospital Stay (HOSPITAL_COMMUNITY)
Admission: AD | Admit: 2016-11-27 | Discharge: 2016-12-08 | DRG: 189 | Disposition: A | Discharge status: Skilled Nursing Facility | Payer: Medicare Other | Source: Ambulatory Visit | Attending: Internal Medicine | Admitting: Internal Medicine

## 2016-11-27 ENCOUNTER — Encounter (HOSPITAL_COMMUNITY): Payer: Self-pay

## 2016-11-27 DIAGNOSIS — J208 Acute bronchitis due to other specified organisms: Secondary | ICD-10-CM | POA: Diagnosis present

## 2016-11-27 DIAGNOSIS — I252 Old myocardial infarction: Secondary | ICD-10-CM

## 2016-11-27 DIAGNOSIS — F419 Anxiety disorder, unspecified: Secondary | ICD-10-CM | POA: Diagnosis not present

## 2016-11-27 DIAGNOSIS — N289 Disorder of kidney and ureter, unspecified: Secondary | ICD-10-CM | POA: Diagnosis not present

## 2016-11-27 DIAGNOSIS — H3530 Unspecified macular degeneration: Secondary | ICD-10-CM | POA: Diagnosis present

## 2016-11-27 DIAGNOSIS — K5900 Constipation, unspecified: Secondary | ICD-10-CM | POA: Diagnosis present

## 2016-11-27 DIAGNOSIS — Z7982 Long term (current) use of aspirin: Secondary | ICD-10-CM

## 2016-11-27 DIAGNOSIS — I2510 Atherosclerotic heart disease of native coronary artery without angina pectoris: Secondary | ICD-10-CM | POA: Diagnosis not present

## 2016-11-27 DIAGNOSIS — D696 Thrombocytopenia, unspecified: Secondary | ICD-10-CM

## 2016-11-27 DIAGNOSIS — K219 Gastro-esophageal reflux disease without esophagitis: Secondary | ICD-10-CM | POA: Diagnosis present

## 2016-11-27 DIAGNOSIS — D61818 Other pancytopenia: Secondary | ICD-10-CM | POA: Diagnosis not present

## 2016-11-27 DIAGNOSIS — R4182 Altered mental status, unspecified: Secondary | ICD-10-CM | POA: Diagnosis not present

## 2016-11-27 DIAGNOSIS — J383 Other diseases of vocal cords: Secondary | ICD-10-CM | POA: Diagnosis present

## 2016-11-27 DIAGNOSIS — D509 Iron deficiency anemia, unspecified: Secondary | ICD-10-CM | POA: Diagnosis present

## 2016-11-27 DIAGNOSIS — J9622 Acute and chronic respiratory failure with hypercapnia: Secondary | ICD-10-CM | POA: Diagnosis not present

## 2016-11-27 DIAGNOSIS — D638 Anemia in other chronic diseases classified elsewhere: Secondary | ICD-10-CM | POA: Diagnosis present

## 2016-11-27 DIAGNOSIS — C9000 Multiple myeloma not having achieved remission: Secondary | ICD-10-CM | POA: Diagnosis not present

## 2016-11-27 DIAGNOSIS — Z66 Do not resuscitate: Secondary | ICD-10-CM | POA: Diagnosis present

## 2016-11-27 DIAGNOSIS — D469 Myelodysplastic syndrome, unspecified: Secondary | ICD-10-CM | POA: Diagnosis not present

## 2016-11-27 DIAGNOSIS — N401 Enlarged prostate with lower urinary tract symptoms: Secondary | ICD-10-CM | POA: Diagnosis present

## 2016-11-27 DIAGNOSIS — Z862 Personal history of diseases of the blood and blood-forming organs and certain disorders involving the immune mechanism: Secondary | ICD-10-CM | POA: Diagnosis not present

## 2016-11-27 DIAGNOSIS — E569 Vitamin deficiency, unspecified: Secondary | ICD-10-CM | POA: Diagnosis not present

## 2016-11-27 DIAGNOSIS — R52 Pain, unspecified: Secondary | ICD-10-CM | POA: Diagnosis not present

## 2016-11-27 DIAGNOSIS — R05 Cough: Secondary | ICD-10-CM | POA: Diagnosis not present

## 2016-11-27 DIAGNOSIS — J449 Chronic obstructive pulmonary disease, unspecified: Secondary | ICD-10-CM | POA: Diagnosis not present

## 2016-11-27 DIAGNOSIS — H9190 Unspecified hearing loss, unspecified ear: Secondary | ICD-10-CM | POA: Diagnosis present

## 2016-11-27 DIAGNOSIS — R64 Cachexia: Secondary | ICD-10-CM | POA: Diagnosis present

## 2016-11-27 DIAGNOSIS — Z87891 Personal history of nicotine dependence: Secondary | ICD-10-CM

## 2016-11-27 DIAGNOSIS — D472 Monoclonal gammopathy: Secondary | ICD-10-CM | POA: Diagnosis not present

## 2016-11-27 DIAGNOSIS — I5032 Chronic diastolic (congestive) heart failure: Secondary | ICD-10-CM | POA: Diagnosis not present

## 2016-11-27 DIAGNOSIS — Z7952 Long term (current) use of systemic steroids: Secondary | ICD-10-CM

## 2016-11-27 DIAGNOSIS — D561 Beta thalassemia: Secondary | ICD-10-CM | POA: Diagnosis not present

## 2016-11-27 DIAGNOSIS — D563 Thalassemia minor: Secondary | ICD-10-CM | POA: Diagnosis present

## 2016-11-27 DIAGNOSIS — I130 Hypertensive heart and chronic kidney disease with heart failure and stage 1 through stage 4 chronic kidney disease, or unspecified chronic kidney disease: Secondary | ICD-10-CM | POA: Diagnosis present

## 2016-11-27 DIAGNOSIS — Z8674 Personal history of sudden cardiac arrest: Secondary | ICD-10-CM

## 2016-11-27 DIAGNOSIS — J9601 Acute respiratory failure with hypoxia: Secondary | ICD-10-CM | POA: Diagnosis not present

## 2016-11-27 DIAGNOSIS — E785 Hyperlipidemia, unspecified: Secondary | ICD-10-CM | POA: Diagnosis present

## 2016-11-27 DIAGNOSIS — J440 Chronic obstructive pulmonary disease with acute lower respiratory infection: Secondary | ICD-10-CM | POA: Diagnosis present

## 2016-11-27 DIAGNOSIS — Z7951 Long term (current) use of inhaled steroids: Secondary | ICD-10-CM

## 2016-11-27 DIAGNOSIS — D619 Aplastic anemia, unspecified: Secondary | ICD-10-CM | POA: Diagnosis not present

## 2016-11-27 DIAGNOSIS — Z9981 Dependence on supplemental oxygen: Secondary | ICD-10-CM

## 2016-11-27 DIAGNOSIS — N390 Urinary tract infection, site not specified: Secondary | ICD-10-CM | POA: Diagnosis not present

## 2016-11-27 DIAGNOSIS — E1122 Type 2 diabetes mellitus with diabetic chronic kidney disease: Secondary | ICD-10-CM | POA: Diagnosis present

## 2016-11-27 DIAGNOSIS — I255 Ischemic cardiomyopathy: Secondary | ICD-10-CM | POA: Diagnosis present

## 2016-11-27 DIAGNOSIS — R000 Tachycardia, unspecified: Secondary | ICD-10-CM | POA: Diagnosis present

## 2016-11-27 DIAGNOSIS — Z95810 Presence of automatic (implantable) cardiac defibrillator: Secondary | ICD-10-CM

## 2016-11-27 DIAGNOSIS — J9621 Acute and chronic respiratory failure with hypoxia: Secondary | ICD-10-CM

## 2016-11-27 DIAGNOSIS — E1140 Type 2 diabetes mellitus with diabetic neuropathy, unspecified: Secondary | ICD-10-CM | POA: Diagnosis present

## 2016-11-27 DIAGNOSIS — R338 Other retention of urine: Secondary | ICD-10-CM | POA: Diagnosis present

## 2016-11-27 DIAGNOSIS — Z801 Family history of malignant neoplasm of trachea, bronchus and lung: Secondary | ICD-10-CM

## 2016-11-27 DIAGNOSIS — J441 Chronic obstructive pulmonary disease with (acute) exacerbation: Secondary | ICD-10-CM | POA: Diagnosis not present

## 2016-11-27 DIAGNOSIS — R059 Cough, unspecified: Secondary | ICD-10-CM

## 2016-11-27 DIAGNOSIS — E538 Deficiency of other specified B group vitamins: Secondary | ICD-10-CM | POA: Diagnosis not present

## 2016-11-27 DIAGNOSIS — Z8249 Family history of ischemic heart disease and other diseases of the circulatory system: Secondary | ICD-10-CM

## 2016-11-27 DIAGNOSIS — I509 Heart failure, unspecified: Secondary | ICD-10-CM | POA: Diagnosis not present

## 2016-11-27 DIAGNOSIS — R627 Adult failure to thrive: Secondary | ICD-10-CM | POA: Diagnosis present

## 2016-11-27 DIAGNOSIS — J90 Pleural effusion, not elsewhere classified: Secondary | ICD-10-CM | POA: Diagnosis not present

## 2016-11-27 DIAGNOSIS — I5033 Acute on chronic diastolic (congestive) heart failure: Secondary | ICD-10-CM | POA: Diagnosis present

## 2016-11-27 DIAGNOSIS — L298 Other pruritus: Secondary | ICD-10-CM | POA: Diagnosis not present

## 2016-11-27 DIAGNOSIS — Z7902 Long term (current) use of antithrombotics/antiplatelets: Secondary | ICD-10-CM

## 2016-11-27 DIAGNOSIS — N183 Chronic kidney disease, stage 3 (moderate): Secondary | ICD-10-CM | POA: Diagnosis present

## 2016-11-27 DIAGNOSIS — J189 Pneumonia, unspecified organism: Secondary | ICD-10-CM | POA: Diagnosis not present

## 2016-11-27 DIAGNOSIS — J8489 Other specified interstitial pulmonary diseases: Secondary | ICD-10-CM | POA: Diagnosis not present

## 2016-11-27 DIAGNOSIS — D508 Other iron deficiency anemias: Secondary | ICD-10-CM | POA: Diagnosis not present

## 2016-11-27 DIAGNOSIS — E611 Iron deficiency: Secondary | ICD-10-CM | POA: Diagnosis not present

## 2016-11-27 DIAGNOSIS — G4709 Other insomnia: Secondary | ICD-10-CM | POA: Diagnosis not present

## 2016-11-27 DIAGNOSIS — Z79899 Other long term (current) drug therapy: Secondary | ICD-10-CM

## 2016-11-27 DIAGNOSIS — J432 Centrilobular emphysema: Secondary | ICD-10-CM | POA: Diagnosis not present

## 2016-11-27 DIAGNOSIS — D649 Anemia, unspecified: Secondary | ICD-10-CM

## 2016-11-27 DIAGNOSIS — D4989 Neoplasm of unspecified behavior of other specified sites: Secondary | ICD-10-CM | POA: Diagnosis not present

## 2016-11-27 DIAGNOSIS — Z950 Presence of cardiac pacemaker: Secondary | ICD-10-CM | POA: Diagnosis not present

## 2016-11-27 DIAGNOSIS — J9600 Acute respiratory failure, unspecified whether with hypoxia or hypercapnia: Secondary | ICD-10-CM | POA: Diagnosis not present

## 2016-11-27 DIAGNOSIS — R0602 Shortness of breath: Secondary | ICD-10-CM | POA: Diagnosis not present

## 2016-11-27 DIAGNOSIS — M6281 Muscle weakness (generalized): Secondary | ICD-10-CM | POA: Diagnosis not present

## 2016-11-27 DIAGNOSIS — Z6821 Body mass index (BMI) 21.0-21.9, adult: Secondary | ICD-10-CM

## 2016-11-27 LAB — CBC WITH DIFFERENTIAL/PLATELET
BASOS ABS: 0 10*3/uL (ref 0.0–0.1)
Basophils Relative: 0 %
EOS ABS: 0 10*3/uL (ref 0.0–0.7)
EOS PCT: 1 %
HCT: 20.5 % — ABNORMAL LOW (ref 39.0–52.0)
HEMOGLOBIN: 6.7 g/dL — AB (ref 13.0–17.0)
LYMPHS ABS: 0.5 10*3/uL — AB (ref 0.7–4.0)
Lymphocytes Relative: 12 %
MCH: 23.3 pg — AB (ref 26.0–34.0)
MCHC: 32.7 g/dL (ref 30.0–36.0)
MCV: 71.4 fL — ABNORMAL LOW (ref 78.0–100.0)
Monocytes Absolute: 0.1 10*3/uL (ref 0.1–1.0)
Monocytes Relative: 1 %
NEUTROS PCT: 86 %
Neutro Abs: 3.6 10*3/uL (ref 1.7–7.7)
PLATELETS: 58 10*3/uL — AB (ref 150–400)
RBC: 2.87 MIL/uL — AB (ref 4.22–5.81)
RDW: 18.8 % — ABNORMAL HIGH (ref 11.5–15.5)
WBC: 4.2 10*3/uL (ref 4.0–10.5)

## 2016-11-27 LAB — URINALYSIS, ROUTINE W REFLEX MICROSCOPIC
Bilirubin Urine: NEGATIVE
GLUCOSE, UA: NEGATIVE mg/dL
HGB URINE DIPSTICK: NEGATIVE
Ketones, ur: 5 mg/dL — AB
LEUKOCYTES UA: NEGATIVE
NITRITE: NEGATIVE
PH: 5 (ref 5.0–8.0)
PROTEIN: 30 mg/dL — AB
SPECIFIC GRAVITY, URINE: 1.018 (ref 1.005–1.030)
WBC, UA: NONE SEEN WBC/hpf (ref 0–5)

## 2016-11-27 LAB — COMPREHENSIVE METABOLIC PANEL
ALT: 15 U/L — AB (ref 17–63)
AST: 16 U/L (ref 15–41)
Albumin: 2.7 g/dL — ABNORMAL LOW (ref 3.5–5.0)
Alkaline Phosphatase: 41 U/L (ref 38–126)
Anion gap: 9 (ref 5–15)
BILIRUBIN TOTAL: 0.8 mg/dL (ref 0.3–1.2)
BUN: 30 mg/dL — AB (ref 6–20)
CHLORIDE: 102 mmol/L (ref 101–111)
CO2: 27 mmol/L (ref 22–32)
CREATININE: 1.33 mg/dL — AB (ref 0.61–1.24)
Calcium: 8.3 mg/dL — ABNORMAL LOW (ref 8.9–10.3)
GFR, EST AFRICAN AMERICAN: 53 mL/min — AB (ref >60)
GFR, EST NON AFRICAN AMERICAN: 46 mL/min — AB (ref >60)
Glucose, Bld: 92 mg/dL (ref 65–99)
POTASSIUM: 4.8 mmol/L (ref 3.5–5.1)
Sodium: 138 mmol/L (ref 135–145)
TOTAL PROTEIN: 6.1 g/dL — AB (ref 6.5–8.1)

## 2016-11-27 LAB — TROPONIN I: TROPONIN I: 0.03 ng/mL — AB (ref <0.03)

## 2016-11-27 LAB — BRAIN NATRIURETIC PEPTIDE: B NATRIURETIC PEPTIDE 5: 124.2 pg/mL — AB (ref 0.0–100.0)

## 2016-11-27 LAB — PREPARE RBC (CROSSMATCH)

## 2016-11-27 MED ORDER — SENNOSIDES-DOCUSATE SODIUM 8.6-50 MG PO TABS
1.0000 | ORAL_TABLET | Freq: Every evening | ORAL | Status: DC | PRN
  Administered 2016-11-29 – 2016-12-07 (×4): 1 via ORAL
  Filled 2016-11-27 (×3): qty 1

## 2016-11-27 MED ORDER — ALBUTEROL SULFATE (2.5 MG/3ML) 0.083% IN NEBU
2.5000 mg | INHALATION_SOLUTION | Freq: Four times a day (QID) | RESPIRATORY_TRACT | Status: DC
Start: 2016-11-28 — End: 2016-11-27

## 2016-11-27 MED ORDER — VANCOMYCIN HCL IN DEXTROSE 1-5 GM/200ML-% IV SOLN: 1000.0000 mg | INTRAVENOUS | Status: DC

## 2016-11-27 MED ORDER — SODIUM CHLORIDE 0.9% FLUSH
3.0000 mL | Freq: Two times a day (BID) | INTRAVENOUS | Status: DC
  Administered 2016-11-28 – 2016-12-05 (×14): 3 mL via INTRAVENOUS

## 2016-11-27 MED ORDER — SODIUM CHLORIDE 0.9 % IV SOLN
INTRAVENOUS | Status: DC
  Administered 2016-11-27: 17:00:00 via INTRAVENOUS

## 2016-11-27 MED ORDER — PANTOPRAZOLE SODIUM 40 MG PO TBEC
40.0000 mg | DELAYED_RELEASE_TABLET | Freq: Every day | ORAL | Status: DC
  Administered 2016-11-28 – 2016-12-08 (×11): 40 mg via ORAL
  Filled 2016-11-27 (×11): qty 1

## 2016-11-27 MED ORDER — ARFORMOTEROL TARTRATE 15 MCG/2ML IN NEBU
15.0000 ug | INHALATION_SOLUTION | Freq: Two times a day (BID) | RESPIRATORY_TRACT | Status: DC
Start: 2016-11-27 — End: 2016-12-08
  Administered 2016-11-27 – 2016-12-07 (×21): 15 ug via RESPIRATORY_TRACT
  Filled 2016-11-27 (×24): qty 2

## 2016-11-27 MED ORDER — SODIUM CHLORIDE 0.9% FLUSH
3.0000 mL | INTRAVENOUS | Status: DC | PRN
  Administered 2016-12-01: 3 mL via INTRAVENOUS
  Filled 2016-11-27: qty 3

## 2016-11-27 MED ORDER — ACETAMINOPHEN 325 MG PO TABS
650.0000 mg | ORAL_TABLET | Freq: Four times a day (QID) | ORAL | Status: DC | PRN
  Administered 2016-12-07: 650 mg via ORAL
  Filled 2016-11-27: qty 2

## 2016-11-27 MED ORDER — ONDANSETRON HCL 4 MG PO TABS: 4.0000 mg | ORAL_TABLET | Freq: Four times a day (QID) | ORAL | Status: DC | PRN

## 2016-11-27 MED ORDER — ALPRAZOLAM 0.25 MG PO TABS
0.2500 mg | ORAL_TABLET | Freq: Every evening | ORAL | Status: DC | PRN
  Administered 2016-11-27 – 2016-12-07 (×10): 0.25 mg via ORAL
  Filled 2016-11-27 (×12): qty 1

## 2016-11-27 MED ORDER — FLUTICASONE PROPIONATE 50 MCG/ACT NA SUSP
1.0000 | Freq: Every day | NASAL | Status: DC
Start: 2016-11-28 — End: 2016-12-08
  Administered 2016-11-28 – 2016-12-08 (×11): 1 via NASAL
  Filled 2016-11-27: qty 16

## 2016-11-27 MED ORDER — ROSUVASTATIN CALCIUM 5 MG PO TABS
5.0000 mg | ORAL_TABLET | Freq: Every day | ORAL | Status: DC
  Administered 2016-11-28 – 2016-11-29 (×2): 5 mg via ORAL
  Administered 2016-11-30: 20 mg via ORAL
  Administered 2016-12-01 – 2016-12-08 (×8): 5 mg via ORAL
  Filled 2016-11-27 (×11): qty 1

## 2016-11-27 MED ORDER — METOPROLOL TARTRATE 25 MG PO TABS
12.5000 mg | ORAL_TABLET | Freq: Two times a day (BID) | ORAL | Status: DC
  Administered 2016-11-27 – 2016-12-08 (×22): 12.5 mg via ORAL
  Filled 2016-11-27 (×22): qty 1

## 2016-11-27 MED ORDER — DEXTROSE 5 % IV SOLN
2.0000 g | INTRAVENOUS | Status: DC
  Administered 2016-11-27: 2 g via INTRAVENOUS
  Filled 2016-11-27: qty 2

## 2016-11-27 MED ORDER — ONDANSETRON HCL 4 MG/2ML IJ SOLN: 4.0000 mg | Freq: Four times a day (QID) | INTRAMUSCULAR | Status: DC | PRN

## 2016-11-27 MED ORDER — GUAIFENESIN ER 600 MG PO TB12
600.0000 mg | ORAL_TABLET | Freq: Two times a day (BID) | ORAL | Status: DC
  Administered 2016-11-27 – 2016-12-08 (×21): 600 mg via ORAL
  Filled 2016-11-27 (×21): qty 1

## 2016-11-27 MED ORDER — BUDESONIDE 0.5 MG/2ML IN SUSP
0.5000 mg | Freq: Two times a day (BID) | RESPIRATORY_TRACT | Status: DC
Start: 2016-11-27 — End: 2016-12-08
  Administered 2016-11-27 – 2016-12-08 (×22): 0.5 mg via RESPIRATORY_TRACT
  Filled 2016-11-27 (×22): qty 2

## 2016-11-27 MED ORDER — ALBUTEROL SULFATE (2.5 MG/3ML) 0.083% IN NEBU
2.5000 mg | INHALATION_SOLUTION | RESPIRATORY_TRACT | Status: DC | PRN
  Administered 2016-11-27 – 2016-12-06 (×11): 2.5 mg via RESPIRATORY_TRACT
  Filled 2016-11-27 (×14): qty 3

## 2016-11-27 MED ORDER — SODIUM CHLORIDE 0.9 % IV SOLN: 250.0000 mL | INTRAVENOUS | Status: DC | PRN

## 2016-11-27 MED ORDER — ACETAMINOPHEN 650 MG RE SUPP
650.0000 mg | Freq: Four times a day (QID) | RECTAL | Status: DC | PRN
Start: 2016-11-27 — End: 2016-12-07

## 2016-11-27 MED ORDER — SODIUM CHLORIDE 0.9 % IV SOLN: Freq: Once | INTRAVENOUS | Status: DC

## 2016-11-27 MED ORDER — ALBUTEROL SULFATE (2.5 MG/3ML) 0.083% IN NEBU
2.5000 mg | INHALATION_SOLUTION | RESPIRATORY_TRACT | Status: DC
  Administered 2016-11-27 – 2016-11-28 (×4): 2.5 mg via RESPIRATORY_TRACT
  Filled 2016-11-27 (×4): qty 3

## 2016-11-27 MED ORDER — VITAMIN C 500 MG PO TABS
250.0000 mg | ORAL_TABLET | Freq: Two times a day (BID) | ORAL | Status: DC
  Administered 2016-11-27 – 2016-12-08 (×20): 250 mg via ORAL
  Filled 2016-11-27 (×22): qty 1

## 2016-11-27 MED ORDER — FERROUS SULFATE 325 (65 FE) MG PO TABS
325.0000 mg | ORAL_TABLET | Freq: Two times a day (BID) | ORAL | Status: DC
  Administered 2016-11-27 – 2016-11-28 (×3): 325 mg via ORAL
  Filled 2016-11-27 (×3): qty 1

## 2016-11-27 MED ORDER — DOCUSATE SODIUM 100 MG PO CAPS: 100.0000 mg | ORAL_CAPSULE | Freq: Two times a day (BID) | ORAL | Status: DC | PRN

## 2016-11-27 MED ORDER — VANCOMYCIN HCL IN DEXTROSE 750-5 MG/150ML-% IV SOLN: 750.0000 mg | INTRAVENOUS | Status: DC

## 2016-11-27 MED ORDER — VANCOMYCIN HCL 10 G IV SOLR
1500.0000 mg | Freq: Once | INTRAVENOUS | Status: AC
  Administered 2016-11-27: 1500 mg via INTRAVENOUS
  Filled 2016-11-27 (×2): qty 1500

## 2016-11-27 NOTE — Progress Notes (Signed)
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 11/27/2016, 8:56 PM

## 2016-11-27 NOTE — Progress Notes (Addendum)
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  Prev admit 10/3 and 10/15 for PNA.  Pharmacy has been consulted for Vancomycin and Cefepime  dosing.  Plan: Vancomycin 1500mg  x1, then 750mg  q24  Cefepime 2gm q24  Height: 6' (182.9 cm) Weight: 158 lb 15.2 oz (72.1 kg) IBW/kg (Calculated) : 77.6  Temp (24hrs), Avg:99.1 F (37.3 C), Min:98.9 F (37.2 C), Max:99.2 F (37.3 C)   Recent Labs Lab 11/24/16 1617  WBC 4.1    Estimated Creatinine Clearance: 43.3 mL/min (by C-G formula based on SCr of 1.18 mg/dL).    No Known Allergies  Antimicrobials this admission: 11/1 Cefepime >>  11/1 Vancomycin  >>   Dose adjustments this admission:  Microbiology results: 11/1 BCx: sent         UCx: ordered          Sputum: ordered    Thank you for allowing pharmacy to be a part of this patient's care.  Minda Ditto 11/27/2016 5:02 PM

## 2016-11-27 NOTE — H&P (Signed)
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   SIGNIFICANT EVENTS  Admitted 10/29/16 for CAP  Admitted 10/15 for CAP/worsening RF , Anemia   STUDIES:    HISTORY OF PRESENT ILLNESS:   Patient is an 83 year old male former smoker that has underlying asthma and COPD on home oxygen on 2 L.  Patient became acutely ill on October 29, 2016 requiring hospitalization.  He was found to have a left upper lobe pneumonia , sepsis ,COPD exacerbation and acute on chronic respiratory failure.  Patient was treated with IV antibiotics steroids and nebulized bronchodilators.  Patient did require initial BiPAP support on admission.  He was found to have worsening anemia.  He required 1 unit of packed red blood cells.. Patient did have some worsening of his chronic renal insufficiency.  Patient has known chronic urinary retention due to enlarged prostate.  He requires home self cath at least 6 times a day.. Patient was discharged on antibiotics.  One week after discharge patient was declining at home.  And was readmitted on October 15. Patient was placed on a slow prednisone taper.  Continued on pulmonary hygiene with Brovana and Pulmicort nebulizers.  Patient was seen by hematology for pancytopenia. He is undergoing a workup for a myelo dysplasia Or a lymphoproliferative disorder.  Patient says he was scheduled for a bone marrow biopsy next week. Patient was discharged 1 week ago.  Chest x-ray did show resolution of left upper lobe pneumonia.  Patient says since being home he remains very weak.  Appetite has been low.  He has been requiring more oxygen.  Typically on a baseline of 2 L.  He does need up to 4 L due to drop in his sats down to 60%.   On arrival today patient's O2 saturation 100% on 4 L.  Patient says he has been running fevers over the last week.  Temperature today in the office is 101.2.  Patient says he is more short of breath.  Patient denies any increased cough congestion or wheezing.  He denies hemoptysis abdominal pain nausea vomiting or diarrhea.  No dysuria.. Patient will require hospitalization and further evaluation and treatment.Marland Kitchen  PAST MEDICAL HISTORY :   has a past medical history of Anemia, unspecified; CAD (coronary artery disease); Cataracts, bilateral; CHF (congestive heart failure) ([place]); Chronic airway obstruction, not elsewhere classified; Diabetes mellitus; Diverticulosis of colon (without mention of hemorrhage); Dyslipidemia; Emphysema; Hemorrhoids; ICD (implantable cardiac defibrillator) in place; Ischemic cardiomyopathy; Macular degeneration; MI, old (2010 or 2011); Prostatic hypertrophy; Retention of urine, unspecified; and Unspecified disorder resulting from impaired renal function.  has a past surgical history that includes pci; Cardiac defibrillator placement (2011); Pacemaker insertion (2011); Tonsillectomy (83 yo); and Cataract extraction. Prior to Admission medications   Medication Sig Start Date End Date Taking? Authorizing Provider  albuterol (PROVENTIL) (2.5 MG/3ML) 0.083% nebulizer solution Take 2.5 mg by nebulization every 4 (four) hours as needed for wheezing or shortness of breath.    [provider]  ALPRAZolam Duanne Moron) 0.25 MG tablet Take 0.25 mg by mouth at bedtime as needed for sleep.  02/13/15   [provider]  arformoterol (BROVANA) 15 MCG/2ML NEBU Take 15 mcg by nebulization 2 (two) times daily.  [provider]  aspirin EC 81 MG tablet Take 1 tablet (81 mg total) by mouth at bedtime. 11/27/13   Dhungel, Nishant, MD  bisacodyl (DULCOLAX) 5 MG EC tablet Take 5 mg by mouth daily as needed for moderate constipation.    [provider]  budesonide  (PULMICORT) 0.5 MG/2ML nebulizer solution Take 2 mLs (0.5 mg total) by nebulization 2 (two) times daily. 08/31/12   Parrett, Fonnie Mu, NP  clopidogrel (PLAVIX) 75 MG tablet Take 1 tablet (75 mg total) by mouth daily. 07/07/16   Josue Hector, MD  docusate sodium (COLACE) 100 MG capsule Take 100 mg by mouth 2 (two) times daily as needed for mild constipation.    [provider]  ferrous sulfate 325 (65 FE) MG tablet Take 1 tablet (325 mg total) by mouth 2 (two) times daily with a meal. 11/14/16   Ollis, Brandi L, NP  fluticasone (FLONASE) 50 MCG/ACT nasal spray Place 1 spray into both nostrils daily. 11/15/16   Donita Brooks, NP  guaiFENesin (MUCINEX) 600 MG 12 hr tablet Take 600 mg by mouth 2 (two) times daily as needed for cough or to loosen phlegm.     [provider]  metoprolol tartrate (LOPRESSOR) 25 MG tablet Take 12.5 mg by mouth 2 (two) times daily. Reported on 06/06/2015    [provider]  nitroGLYCERIN (NITROSTAT) 0.4 MG SL tablet Place 1 tablet (0.4 mg total) under the tongue every 5 (five) minutes as needed for chest pain. UP TO 3 DOSES THEN CALL EMS 11/02/16   Domenic Polite, MD  OXYGEN Inhale 2 L into the lungs at bedtime.     [provider]  pantoprazole (PROTONIX) 40 MG tablet Take 1 tablet (40 mg total) by mouth daily. 11/15/16   Donita Brooks, NP  predniSONE (DELTASONE) 20 MG tablet Take 1 tablet (20 mg total) by mouth daily with breakfast. 11/15/16   Donita Brooks, NP  rosuvastatin (CRESTOR) 5 MG tablet Take 1 tablet (5 mg total) by mouth daily at 6 PM. Patient taking differently: Take 5 mg by mouth daily.  08/12/16   Josue Hector, MD  sodium chloride (OCEAN) 0.65 % SOLN nasal spray Place 2 sprays into both nostrils 2 (two) times daily. 11/14/16   Donita Brooks, NP  vitamin C (VITAMIN C) 250 MG tablet Take 1 tablet (250 mg total) by mouth 2 (two) times daily. 11/14/16   Donita Brooks, NP   No Known Allergies  FAMILY HISTORY:  family  history includes Atrial fibrillation in his daughter; Breast cancer in his sister; Congestive Heart Failure in his mother; Coronary artery disease in his mother; Heart attack in his brother; Lung cancer in his father; Stroke in his mother. SOCIAL HISTORY:  reports that he quit smoking about 7 years ago. His smoking use included Cigarettes. He has never used smokeless tobacco. He reports that he drinks alcohol. He reports that he does not use drugs.  REVIEW OF SYSTEMS:   Constitutional:   No  weight loss, night sweats,   +Fevers, chills, fatigue, or  lassitude.  HEENT:   No headaches,  Difficulty swallowing,  Tooth/dental problems, or  Sore throat,                No sneezing, itching, ear ache, nasal congestion, post nasal drip,   CV:  No chest pain,  Orthopnea, PND, +swelling in lower extremities,  NO anasarca, dizziness, palpitations, syncope.   GI  No heartburn, indigestion, abdominal  pain, nausea, vomiting, diarrhea, change in bowel habits, loss of appetite, bloody stools.   Resp:    No chest wall deformity  Skin: no rash or lesions.  GU:+chronic urinary retention ,   No flank pain, no hematuria   MS:  No joint pain or swelling.  No decreased range of motion.  No back pain.  Psych:  No change in mood or affect. No depression or anxiety.  No memory loss.   SUBJECTIVE:  Weak  And fever    VITAL SIGNS: Temp:  [99.2 F (37.3 C)] 99.2 F (37.3 C) (11/01 1440) Pulse Rate:  [74] 74 (11/01 1440) BP: (104)/(64) 104/64 (11/01 1440) SpO2:  [100 %] 100 % (11/01 1440) Weight:  [160 lb 9.6 oz (72.8 kg)] 160 lb 9.6 oz (72.8 kg) (11/01 1440)  PHYSICAL EXAMINATION: GEN: A/Ox3; pleasant , NAD, elderly , frail , in WC on O2    HEENT:  [place]/AT,  EACs-clear, TMs-wnl, NOSE-clear, THROAT-clear, no lesions, no postnasal drip or exudate noted.   NECK:  Supple w/ fair ROM; no JVD; normal carotid impulses w/o bruits; no thyromegaly or nodules palpated; no lymphadenopathy.    RESP  Decreased BS  in bases , . no accessory muscle use, no dullness to percussion  CARD:  RRR, no m/r/g  , tr  peripheral edema, pulses intact, no cyanosis or clubbing.  GI:   Soft & nt; nml bowel sounds; no organomegaly or masses detected.   Musco: Warm bil, no deformities or joint swelling noted.   Neuro: alert, no focal deficits noted.    Skin: Warm, no lesions or rashes    Recent Labs Lab 11/27/16 1700  NA 138  K 4.8  CL 102  CO2 27  BUN 30*  CREATININE 1.33*  GLUCOSE 92    Recent Labs Lab 11/24/16 1617  HGB 7.5*  HCT 24.1*  WBC 4.1  PLT 45*   X-ray Chest Pa And Lateral  Result Date: 11/27/2016 CLINICAL DATA:  Increased weakness, fever and dyspnea over the past several days. EXAM: CHEST  2 VIEW COMPARISON:  11/10/2016. FINDINGS: Heart is within normal limits. Tortuous thoracic aorta with atherosclerosis. Left-sided ICD device projects over the shoulder with leads in the right atrium and right ventricle. Emphysematous hyperinflation of the lungs. Mild increase in vascular congestion is noted since previous study. No effusion or pneumothorax. No acute osseous abnormality. Degenerative changes are noted about the [facility] and glenohumeral joints. IMPRESSION: 1. COPD with slight increase in vascular congestion since prior exam. No pneumothorax or pneumonic consolidation. 2. Aortic atherosclerosis. Electronically Signed   By: Ashley Royalty M.D.   On: 11/27/2016 20:14    ASSESSMENT / PLAN: 1, Acute on chronic hypoxic respiratory failure.  Patient's O2 will be adjusted to keep O2 saturations greater than 90%.  Plan  Admit to [facility] med surg.  Chest x-ray is neg for pna.  Begin empiric antibiotics.  -Maxipime and Vanc since recent admit places him at risk of hosp acq infections  Pan culture.  O2 to keep sats >90%   2. Chronic Anemia -?Myelodysplastic or lymphoproliferative disorder.  Hematology following .    Lab Results  Component Value Date   HGB 6.7 (LL) 11/27/2016   HGB 7.5 (L) 11/24/2016     HGB 8.9 (L) 11/19/2016   HGB 8.1 (L) 11/13/2016   HGB 8.5 (L) 11/12/2016   HGB 10.4 (L) 03/07/2011     Plan  Check cbc  Trans per protocol  May need hematology consult  Hold plavix  and asa until labs back  Cont on Iron  SCD , hold heparin/lovenox for now until cbc is back.    3. Diastolic CHF with previous cardiac arrest in 2011 with ICD   Plan  Check BNP   4. DM -not on rx   Plan  Check bs daily   5. Chronic renal failure   Plan  Check bmet  IVF NS at 30cc , KVO if bnp is elevated and oral intake improves.   6. GERD   Cont PPI   7 HTN   Plan  Cont on current rx   8. COPD - does not appear flared, hold on steroids for now  Check xray   Plan  CXR  Cont on Brovana and pulmicort neb  Albuterol As needed   mucinex    BEST practice  SCD  Hold anticoagulants until cbc is back.       Rexene Edison NP -C  Pulmonary and [place] Pager: [phone]  11/27/2016, 3:36 PM    I saw and examined pt in office independently with no obvious explanation for fever and worsening hypoxemic Resp failure and rec admit for further w/u  Agree with a/ p as outlined  Christinia Gully, MD Pulmonary and [place] [phone] After 5:30 PM or weekends, use [facility] [phone]

## 2016-11-27 NOTE — Assessment & Plan Note (Signed)
Admit, see H&P

## 2016-11-27 NOTE — Progress Notes (Signed)
Patient self caths 3 times during the day and twice at night

## 2016-11-27 NOTE — Progress Notes (Signed)
CRITICAL VALUE ALERT  Critical Value:  HGB 6.7, TROPONIN 0.03  Date & Time Notied: 11/27/16,1740  Provider Notified:DR McQUAID  Orders Received/Actions taken: YES

## 2016-11-27 NOTE — Progress Notes (Signed)
@Patient  ID: [name] Azure, MD, male    DOB: 1925-10-29, 83 y.o.   MRN: [id number]  No chief complaint on file.   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 Complains of increased weakness, fevers, sob for last several days. He has been admitted x 2 over last month for PNA ., anemia and acute on chronic resp failure.   He has been treated with IV abx , nebs and steroids .  CXR on 10/ showed clearance of PNA. He has been seen by Hematology ,  He was transfused 1 u PRBC and started on iron.  Since discharge 2 weeks ago, pt says he has been going downhill with increased weakness, sob, and fevers. He has been requiring more oxygen up to 4l/m . O2 sats dropping with activity down to 60% . No significant cough . Temp today up to 101.2.  Appetite is down , not eating much . Lives alone, has in home care.  Pt has chronic urinary retention , and self caths at home     No Known Allergies  Immunization History  Administered Date(s) Administered  . Influenza Split 10/28/2010, 10/27/2012  . Influenza Whole 12/08/2011  . Influenza, High Dose Seasonal PF 10/16/2016  . Pneumococcal Conjugate-13 05/26/2013  . Pneumococcal Polysaccharide-23 10/31/1998    Past Medical History:  Diagnosis Date  . Anemia, unspecified   . CAD (coronary artery disease)   . Cataracts, bilateral   . CHF (congestive heart failure) ([place])   . Chronic airway obstruction, not elsewhere classified    on o2 at night  . Diabetes mellitus   . Diverticulosis of colon (without mention of hemorrhage)   . Dyslipidemia   . Emphysema   . Hemorrhoids   . ICD (implantable cardiac defibrillator) in place    st jude  . Ischemic  cardiomyopathy   . Macular degeneration   . MI, old 2010 or 2011  . Prostatic hypertrophy    hx of  . Retention of urine, unspecified   . Unspecified disorder resulting from impaired renal function     Tobacco History: History  Smoking Status  . Former Smoker  . Types: Cigarettes  . Quit date: 04/27/2009  Smokeless Tobacco  . Never Used   Counseling given: Not Answered   Outpatient Encounter Prescriptions as of 11/27/2016  Medication Sig  . albuterol (PROVENTIL) (2.5 MG/3ML) 0.083% nebulizer solution Take 2.5 mg by nebulization every 4 (four) hours as needed for wheezing or shortness of breath.  . ALPRAZolam (XANAX) 0.25 MG tablet Take 0.25 mg by mouth at bedtime as needed for sleep.   Marland Kitchen arformoterol (BROVANA) 15 MCG/2ML NEBU Take 15 mcg by nebulization 2 (two) times daily.  Marland Kitchen aspirin EC 81 MG tablet Take 1 tablet (81 mg total) by mouth at bedtime.  . bisacodyl (DULCOLAX) 5 MG EC tablet Take 5 mg by mouth daily as needed for moderate constipation.  . budesonide (PULMICORT) 0.5 MG/2ML nebulizer solution Take 2 mLs (0.5 mg total) by nebulization 2 (two) times daily.  . clopidogrel (PLAVIX) 75 MG tablet Take 1 tablet (75 mg total) by mouth daily.  Marland Kitchen docusate sodium (COLACE) 100 MG capsule Take 100 mg by mouth  2 (two) times daily as needed for mild constipation.  . ferrous sulfate 325 (65 FE) MG tablet Take 1 tablet (325 mg total) by mouth 2 (two) times daily with a meal.  . fluticasone (FLONASE) 50 MCG/ACT nasal spray Place 1 spray into both nostrils daily.  Marland Kitchen guaiFENesin (MUCINEX) 600 MG 12 hr tablet Take 600 mg by mouth 2 (two) times daily as needed for cough or to loosen phlegm.   . metoprolol tartrate (LOPRESSOR) 25 MG tablet Take 12.5 mg by mouth 2 (two) times daily. Reported on 06/06/2015  . nitroGLYCERIN (NITROSTAT) 0.4 MG SL tablet Place 1 tablet (0.4 mg total) under the tongue every 5 (five) minutes as needed for chest pain. UP TO 3 DOSES THEN CALL EMS  . OXYGEN Inhale 2 L into  the lungs at bedtime.   . pantoprazole (PROTONIX) 40 MG tablet Take 1 tablet (40 mg total) by mouth daily.  . predniSONE (DELTASONE) 20 MG tablet Take 1 tablet (20 mg total) by mouth daily with breakfast.  . rosuvastatin (CRESTOR) 5 MG tablet Take 1 tablet (5 mg total) by mouth daily at 6 PM. (Patient taking differently: Take 5 mg by mouth daily. )  . sodium chloride (OCEAN) 0.65 % SOLN nasal spray Place 2 sprays into both nostrils 2 (two) times daily.  . vitamin C (VITAMIN C) 250 MG tablet Take 1 tablet (250 mg total) by mouth 2 (two) times daily.   No facility-administered encounter medications on file as of 11/27/2016.      Review of Systems  Constitutional:   No  weight loss, night sweats,  + Fevers, chills, fatigue, or  lassitude.  HEENT:   No headaches,  Difficulty swallowing,  Tooth/dental problems, or  Sore throat,                No sneezing, itching, ear ache, nasal congestion, post nasal drip,   CV:  No chest pain,  Orthopnea, PND, swelling in lower extremities, anasarca, dizziness, palpitations, syncope.   GI  No heartburn, indigestion, abdominal pain, nausea, vomiting, diarrhea, change in bowel habits, loss of appetite, bloody stools.   Resp:    No chest wall deformity  Skin: no rash or lesions.  GU: +chronic urinary retention ,  no hematuria   MS:  No joint pain or swelling.  No decreased range of motion.  No back pain.    Physical Exam  BP 104/64 (BP Location: Right Arm, Cuff Size: Normal)   Pulse 74   Temp 99.2 F (37.3 C) (Oral)   Wt 160 lb 9.6 oz (72.8 kg)   SpO2 100%   BMI 21.78 kg/m   GEN: A/Ox3; pleasant , NAD, frail and elderly in wc    HEENT:  [place]/AT,  EACs-clear, TMs-wnl, NOSE-clear, THROAT-clear, no lesions, no postnasal drip or exudate noted.   NECK:  Supple w/ fair ROM; no JVD; normal carotid impulses w/o bruits; no thyromegaly or nodules palpated; no lymphadenopathy.    RESP  Decreased BS in bases . no accessory muscle use, no dullness to  percussion  CARD:  RRR, no m/r/g, tr  peripheral edema, pulses intact, no cyanosis or clubbing.  GI:   Soft & nt; nml bowel sounds; no organomegaly or masses detected.   Musco: Warm bil, no deformities or joint swelling noted.   Neuro: alert, no focal deficits noted.    Skin: Warm, no lesions or rashes    Lab Results:  CBC    Component Value Date/Time   WBC 4.1 11/24/2016  1617   WBC 3.3 (L) 11/13/2016 0345   RBC 3.32 (L) 11/24/2016 1617   RBC 3.48 (L) 11/13/2016 0345   HGB 7.5 (L) 11/24/2016 1617   HCT 24.1 (L) 11/24/2016 1617   PLT 45 (L) 11/24/2016 1617   MCV 72.6 (L) 11/24/2016 1617   MCH 22.6 (L) 11/24/2016 1617   MCH 23.3 (L) 11/13/2016 0345   MCHC 31.1 (L) 11/24/2016 1617   MCHC 32.1 11/13/2016 0345   RDW 19.1 (H) 11/24/2016 1617   LYMPHSABS 0.5 (L) 11/24/2016 1617   MONOABS 0.1 11/24/2016 1617   EOSABS 0.0 11/24/2016 1617   BASOSABS 0.0 11/24/2016 1617    BMET    Component Value Date/Time   NA 136 11/13/2016 0345   K 5.3 (H) 11/13/2016 0345   CL 104 11/13/2016 0345   CO2 25 11/13/2016 0345   GLUCOSE 134 (H) 11/13/2016 0345   BUN 42 (H) 11/13/2016 0345   CREATININE 1.18 11/13/2016 0345   CREATININE 1.48 (H) 06/06/2015 1629   CALCIUM 8.1 (L) 11/13/2016 0345   GFRNONAA 53 (L) 11/13/2016 0345   GFRAA >60 11/13/2016 0345    BNP    Component Value Date/Time   BNP 91.3 11/10/2016 1755   BNP 45.9 06/06/2015 1629    ProBNP    Component Value Date/Time   PROBNP 759.3 (H) 11/11/2013 2139    Imaging: Dg Chest Port 1 View  Result Date: 11/10/2016 CLINICAL DATA:  Hypoxia and anemia.  COPD exacerbation. EXAM: PORTABLE CHEST 1 VIEW COMPARISON:  10/29/2016 FINDINGS: Emphysematous hyperinflation of the lungs are again noted with minimal scarring at the left lung base. No pneumonic consolidation or overt pulmonary edema. No pneumothorax. No pleural effusion. Heart size is within normal limits with right atrial pacer and right ventricular defibrillator leads  in place. Left-sided ICD device projects over the shoulder. Tortuous aorta with aortic atherosclerosis, stable in appearance without aneurysm. Pulmonary vasculature is within normal limits. High-riding humeral heads again noted which may reflect chronic rotator cuff tears. IMPRESSION: 1. 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 2. Stable aortic atherosclerosis. 3. No acute pneumonic consolidation or CHF. Electronically Signed   By: Ashley Royalty M.D.   On: 11/10/2016 17:59   Dg Chest Port 1 View  Result Date: 10/29/2016 CLINICAL DATA:  Shortness of Breath EXAM: PORTABLE CHEST 1 VIEW COMPARISON:  Study obtained earlier in the day FINDINGS: Ill-defined opacity remains on the left, indicative of a degree of pneumonia. Lungs elsewhere are clear. The heart size and pulmonary vascularity are normal. There is aortic atherosclerosis. Pacemaker leads are attached the right atrium and right ventricle. There is degenerative change in each shoulder. There is superior migration of each humeral head. IMPRESSION: Pneumonia in left upper lobe, slightly better defined than earlier in the day. Lungs elsewhere clear. Stable cardiac silhouette. There is aortic atherosclerosis. There are chronic rotator cuff tears bilaterally, characterized by elevation in each humeral head. Aortic Atherosclerosis (ICD10-I70.0). Followup PA and lateral chest radiographs recommended in 3-4 weeks following trial of antibiotic therapy to ensure resolution and exclude underlying malignancy. Electronically Signed   By: Lowella Grip III M.D.   On: 10/29/2016 13:29   Dg Chest Portable 1 View  Result Date: 10/29/2016 CLINICAL DATA:  Shortness of breath with wheezing.  Cough. EXAM: PORTABLE CHEST 1 VIEW COMPARISON:  Jun 14, 2015 FINDINGS: Lungs are mildly hyperexpanded. There is ill-defined opacity in the left mid lung region, suspicious for a developing infiltrate/pneumonia. Lungs elsewhere are clear. Heart size and  pulmonary  vascularity are normal. Pacemaker leads are attached to the right atrium and right ventricle. There is aortic atherosclerosis. No adenopathy. There is degenerative change in the shoulders. There is superior migration of the right humeral head. IMPRESSION: Rather subtle ill-defined opacity in the left mid lung region. Suspect developing pneumonia. Lungs elsewhere clear. Heart size within normal limits. There is aortic atherosclerosis. Pacemaker leads attached to right atrium and right ventricle. Chronic rotator cuff tear on the right with superior migration of the right humeral head. Aortic Atherosclerosis (ICD10-I70.0). Followup PA and lateral chest radiographs recommended in 3-4 weeks following trial of antibiotic therapy to ensure resolution and exclude underlying malignancy. Electronically Signed   By: Lowella Grip III M.D.   On: 10/29/2016 07:37     Assessment & Plan:   No problem-specific Assessment & Plan notes found for this encounter.     Rexene Edison, NP 11/27/2016

## 2016-11-27 NOTE — Patient Instructions (Signed)
Admit  

## 2016-11-28 ENCOUNTER — Inpatient Hospital Stay: Payer: Medicare Other | Admitting: Pulmonary Disease

## 2016-11-28 DIAGNOSIS — J441 Chronic obstructive pulmonary disease with (acute) exacerbation: Secondary | ICD-10-CM

## 2016-11-28 DIAGNOSIS — D61818 Other pancytopenia: Secondary | ICD-10-CM

## 2016-11-28 LAB — SAVE SMEAR

## 2016-11-28 LAB — BASIC METABOLIC PANEL
ANION GAP: 10 (ref 5–15)
BUN: 32 mg/dL — ABNORMAL HIGH (ref 6–20)
CHLORIDE: 103 mmol/L (ref 101–111)
CO2: 24 mmol/L (ref 22–32)
CREATININE: 1.4 mg/dL — AB (ref 0.61–1.24)
Calcium: 8 mg/dL — ABNORMAL LOW (ref 8.9–10.3)
GFR calc non Af Amer: 43 mL/min — ABNORMAL LOW (ref >60)
GFR, EST AFRICAN AMERICAN: 50 mL/min — AB (ref >60)
Glucose, Bld: 102 mg/dL — ABNORMAL HIGH (ref 65–99)
POTASSIUM: 4.5 mmol/L (ref 3.5–5.1)
Sodium: 137 mmol/L (ref 135–145)

## 2016-11-28 LAB — RETICULOCYTES
RBC.: 3.22 MIL/uL — ABNORMAL LOW (ref 4.22–5.81)
RETIC CT PCT: 0.5 % (ref 0.4–3.1)
Retic Count, Absolute: 16.1 10*3/uL — ABNORMAL LOW (ref 19.0–186.0)

## 2016-11-28 LAB — GLUCOSE, CAPILLARY: Glucose-Capillary: 99 mg/dL (ref 65–99)

## 2016-11-28 LAB — RESPIRATORY PANEL BY PCR
ADENOVIRUS-RVPPCR: NOT DETECTED
BORDETELLA PERTUSSIS-RVPCR: NOT DETECTED
CORONAVIRUS 229E-RVPPCR: NOT DETECTED
CORONAVIRUS HKU1-RVPPCR: NOT DETECTED
CORONAVIRUS NL63-RVPPCR: NOT DETECTED
CORONAVIRUS OC43-RVPPCR: NOT DETECTED
Chlamydophila pneumoniae: NOT DETECTED
Influenza A: NOT DETECTED
Influenza B: NOT DETECTED
METAPNEUMOVIRUS-RVPPCR: NOT DETECTED
Mycoplasma pneumoniae: NOT DETECTED
PARAINFLUENZA VIRUS 1-RVPPCR: NOT DETECTED
PARAINFLUENZA VIRUS 2-RVPPCR: NOT DETECTED
Parainfluenza Virus 3: NOT DETECTED
Parainfluenza Virus 4: NOT DETECTED
Respiratory Syncytial Virus: NOT DETECTED
Rhinovirus / Enterovirus: NOT DETECTED

## 2016-11-28 LAB — CBC
HCT: 20.9 % — ABNORMAL LOW (ref 39.0–52.0)
HEMOGLOBIN: 6.9 g/dL — AB (ref 13.0–17.0)
MCH: 24.3 pg — ABNORMAL LOW (ref 26.0–34.0)
MCHC: 33 g/dL (ref 30.0–36.0)
MCV: 73.6 fL — ABNORMAL LOW (ref 78.0–100.0)
PLATELETS: 45 10*3/uL — AB (ref 150–400)
RBC: 2.84 MIL/uL — AB (ref 4.22–5.81)
RDW: 19.2 % — ABNORMAL HIGH (ref 11.5–15.5)
WBC: 3.3 10*3/uL — AB (ref 4.0–10.5)

## 2016-11-28 LAB — LACTATE DEHYDROGENASE: LDH: 189 U/L (ref 98–192)

## 2016-11-28 LAB — PREPARE RBC (CROSSMATCH)

## 2016-11-28 MED ORDER — LORAZEPAM 2 MG/ML IJ SOLN: 1.0000 mg | Freq: Once | INTRAMUSCULAR | Status: DC

## 2016-11-28 MED ORDER — CYANOCOBALAMIN 1000 MCG/ML IJ SOLN
1000.0000 ug | Freq: Every day | INTRAMUSCULAR | Status: DC
  Administered 2016-11-29 – 2016-12-03 (×5): 1000 ug via INTRAMUSCULAR
  Filled 2016-11-28 (×6): qty 1

## 2016-11-28 MED ORDER — SODIUM CHLORIDE 0.9 % IV SOLN: Freq: Once | INTRAVENOUS | Status: AC

## 2016-11-28 MED ORDER — LORAZEPAM 2 MG/ML IJ SOLN
0.5000 mg | INTRAMUSCULAR | Status: DC | PRN
  Administered 2016-11-28 – 2016-12-01 (×3): 1 mg via INTRAVENOUS
  Filled 2016-11-28 (×4): qty 1

## 2016-11-28 MED ORDER — FUROSEMIDE 10 MG/ML IJ SOLN
40.0000 mg | Freq: Once | INTRAMUSCULAR | Status: AC
  Administered 2016-11-28: 40 mg via INTRAVENOUS
  Filled 2016-11-28: qty 4

## 2016-11-28 MED ORDER — ALBUTEROL SULFATE (2.5 MG/3ML) 0.083% IN NEBU
2.5000 mg | INHALATION_SOLUTION | Freq: Four times a day (QID) | RESPIRATORY_TRACT | Status: DC
  Administered 2016-11-29: 2.5 mg via RESPIRATORY_TRACT
  Filled 2016-11-28: qty 3

## 2016-11-28 MED ORDER — ALBUTEROL SULFATE (2.5 MG/3ML) 0.083% IN NEBU: 2.5000 mg | INHALATION_SOLUTION | Freq: Four times a day (QID) | RESPIRATORY_TRACT | Status: DC

## 2016-11-28 MED ORDER — ALBUTEROL SULFATE (2.5 MG/3ML) 0.083% IN NEBU
2.5000 mg | INHALATION_SOLUTION | RESPIRATORY_TRACT | Status: DC
  Administered 2016-11-28: 2.5 mg via RESPIRATORY_TRACT
  Filled 2016-11-28: qty 3

## 2016-11-28 MED ORDER — DOCUSATE SODIUM 100 MG PO CAPS
100.0000 mg | ORAL_CAPSULE | Freq: Two times a day (BID) | ORAL | Status: DC | PRN
  Administered 2016-11-29 – 2016-12-03 (×2): 100 mg via ORAL
  Filled 2016-11-28 (×2): qty 1

## 2016-11-28 MED ORDER — LORAZEPAM 2 MG/ML IJ SOLN
INTRAMUSCULAR | Status: AC
  Filled 2016-11-28: qty 1

## 2016-11-28 NOTE — Consult Note (Signed)
Chief Complaint: Patient was seen in consultation today for pancytopenia  Referring Physician(s): Dr. 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  He has been following with hematology/oncology as an outpatient for pancytopenia.  He was scheduled for an outpatient bone marrow biopsy on 12/08/16 in the radiology department. Primary team now requesting procedure as an inpatient.   Past Medical History:  Diagnosis Date  . Anemia, unspecified   . CAD (coronary artery disease)   . Cataracts, bilateral   . CHF (congestive heart failure) ([place])   . Chronic airway obstruction, not elsewhere classified    on o2 at night  . Diabetes mellitus   . Diverticulosis of colon (without mention of hemorrhage)   . Dyslipidemia   . Emphysema   . Hemorrhoids   . ICD (implantable cardiac defibrillator) in place    st jude  . Ischemic cardiomyopathy   . Macular degeneration   . MI, old 2010 or 2011  . Prostatic hypertrophy    hx of  . Retention of urine, unspecified   . Unspecified disorder resulting from impaired renal function     Past Surgical History:  Procedure Laterality Date  . CARDIAC DEFIBRILLATOR PLACEMENT  2011   st jude  . CATARACT EXTRACTION    . PACEMAKER INSERTION  2011  . pci     4/11  . TONSILLECTOMY  83 yo    Allergies: Patient has no known allergies.  Medications: Prior to Admission medications   Medication Sig Start Date End Date Taking? Authorizing Provider  albuterol (PROVENTIL) (2.5 MG/3ML) 0.083% nebulizer solution Take 2.5 mg by nebulization every 4 (four) hours as needed for wheezing or shortness of breath.   Yes [provider]  ALPRAZolam (XANAX) 0.25 MG tablet Take 0.25 mg by mouth at bedtime as needed for sleep.  02/13/15   Yes [provider]  arformoterol (BROVANA) 15 MCG/2ML NEBU Take 15 mcg by nebulization 2 (two) times daily.   Yes [provider]  aspirin EC 81 MG tablet Take 1 tablet (81 mg total) by mouth at bedtime. 11/27/13  Yes Dhungel, Nishant, MD  bisacodyl (DULCOLAX) 5 MG EC tablet Take 5 mg by mouth daily as needed for moderate constipation.   Yes [provider]  budesonide (PULMICORT) 0.5 MG/2ML nebulizer solution Take 2 mLs (0.5 mg total) by nebulization 2 (two) times daily. 08/31/12  Yes Parrett, Tammy S, NP  clopidogrel (PLAVIX) 75 MG tablet Take 1 tablet (75 mg total) by mouth daily. 07/07/16  Yes Josue Hector, MD  ferrous sulfate 325 (65 FE) MG tablet Take 1 tablet (325 mg total) by mouth 2 (two) times daily with a meal. 11/14/16  Yes Ollis, Brandi L, NP  fluticasone (FLONASE) 50 MCG/ACT nasal spray Place 1 spray into both nostrils daily. 11/15/16  Yes Ollis, Cleaster Corin, NP  guaiFENesin (MUCINEX) 600 MG 12 hr tablet Take 600 mg by mouth 2 (two) times daily as needed for cough or to loosen phlegm.    Yes [provider]  metoprolol tartrate (LOPRESSOR) 25 MG tablet Take 12.5 mg by mouth 2 (two) times daily. Reported on 06/06/2015   Yes [provider]  nitroGLYCERIN (NITROSTAT) 0.4 MG SL tablet Place 1 tablet (0.4 mg total) under the tongue every 5 (five) minutes as  needed for chest pain. UP TO 3 DOSES THEN CALL EMS 11/02/16  Yes Domenic Polite, MD  OXYGEN Inhale 2 L into the lungs at bedtime.    Yes [provider]  pantoprazole (PROTONIX) 40 MG tablet Take 1 tablet (40 mg total) by mouth daily. 11/15/16  Yes Ollis, Cleaster Corin, NP  predniSONE (DELTASONE) 20 MG tablet Take 1 tablet (20 mg total) by mouth daily with breakfast. 11/15/16  Yes Ollis, Brandi L, NP  rosuvastatin (CRESTOR) 5 MG tablet Take 1 tablet (5 mg total) by mouth daily at 6 PM. Patient taking differently: Take 5 mg by mouth daily.  08/12/16  Yes Josue Hector, MD  sodium chloride  (OCEAN) 0.65 % SOLN nasal spray Place 2 sprays into both nostrils 2 (two) times daily. 11/14/16  Yes Ollis, Cleaster Corin, NP  vitamin C (VITAMIN C) 250 MG tablet Take 1 tablet (250 mg total) by mouth 2 (two) times daily. 11/14/16  Yes Donita Brooks, NP     Family History  Problem Relation Age of Onset  . Coronary artery disease Mother   . Stroke Mother   . Congestive Heart Failure Mother   . Lung cancer Father        smoker  . Breast cancer Sister   . Atrial fibrillation Daughter   . Heart attack Brother     Social History   Social History  . Marital status: Widowed    Spouse name: N/A  . Number of children: N/A  . 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 Smoking status: Former Smoker    Types: Cigarettes    Quit date: 04/27/2009  . Smokeless tobacco: Never Used  . Alcohol use Yes     Comment: occasional beer  . Drug use: No  . Sexual activity: No   Other Topics Concern  . None   Social History Narrative   Lives in a house with stairs, which he needs to use, with his wife.  Wife is not able-bodied.  Does not use a cane or walker, but is unsteady on his feet.        [name] Bourne:  [phone] cell, [phone], daughter, POA.     Boyd Kerbs:  [phone], nephew   Juluis Pitch:  [phone], daughter             Review of Systems  Constitutional: Negative for fatigue and fever.  Respiratory: Negative for cough and shortness of breath.   Cardiovascular: Negative for chest pain.  Gastrointestinal: Negative for abdominal pain.  Musculoskeletal: Negative for back pain.  Psychiatric/Behavioral: Negative for behavioral problems and confusion.    Vital Signs: BP (!) 128/45   Pulse 66   Temp 98.7 F (37.1 C) (Oral)   Resp (!) 26   Ht 6' (1.829 m)   Wt 158 lb 1.1 oz (71.7 kg)   SpO2 100%   BMI 21.44 kg/m   Physical Exam  Constitutional: He is oriented to person, place, and time. He  appears well-developed.  Cardiovascular: Normal rate, regular rhythm and normal heart sounds.   Pulmonary/Chest: Effort normal and breath sounds normal. No respiratory distress.  Abdominal: Soft.  Neurological: He is alert and oriented to person, place, and time.  Skin: Skin is warm and dry.  Psychiatric: He has a normal mood and affect. His behavior is normal. Judgment and thought content normal.  Nursing note and vitals reviewed.   Imaging: X-ray  Chest Pa And Lateral  Result Date: 11/27/2016 CLINICAL DATA:  Increased weakness, fever and dyspnea over the past several days. EXAM: CHEST  2 VIEW COMPARISON:  11/10/2016. FINDINGS: Heart is within normal limits. Tortuous thoracic aorta with atherosclerosis. Left-sided ICD device projects over the shoulder with leads in the right atrium and right ventricle. Emphysematous hyperinflation of the lungs. Mild increase in vascular congestion is noted since previous study. No effusion or pneumothorax. No acute osseous abnormality. Degenerative changes are noted about the [facility] and glenohumeral joints. IMPRESSION: 1. COPD with slight increase in vascular congestion since prior exam. No pneumothorax or pneumonic consolidation. 2. Aortic atherosclerosis. Electronically Signed   By: Ashley Royalty M.D.   On: 11/27/2016 20:14   Dg Chest Port 1 View  Result Date: 11/10/2016 CLINICAL DATA:  Hypoxia and anemia.  COPD exacerbation. EXAM: PORTABLE CHEST 1 VIEW COMPARISON:  10/29/2016 FINDINGS: Emphysematous hyperinflation of the lungs are again noted with minimal scarring at the left lung base. No pneumonic consolidation or overt pulmonary edema. No pneumothorax. No pleural effusion. Heart size is within normal limits with right atrial pacer and right ventricular defibrillator leads in place. Left-sided ICD device projects over the shoulder. Tortuous aorta with aortic atherosclerosis, stable in appearance without aneurysm. Pulmonary vasculature is within normal limits.  High-riding humeral heads again noted which may reflect chronic rotator cuff tears. IMPRESSION: 1. 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 2. Stable aortic atherosclerosis. 3. No acute pneumonic consolidation or CHF. Electronically Signed   By: Ashley Royalty M.D.   On: 11/10/2016 17:59    Labs:  CBC:  Recent Labs  11/19/16 1510 11/24/16 1617 11/27/16 1700 11/28/16 0406  WBC 4.6 4.1 4.2 3.3*  HGB 8.9* 7.5* 6.7* 6.9*  HCT 28.3* 24.1* 20.5* 20.9*  PLT 58* 45* 58* 45*    COAGS:  Recent Labs  10/29/16 0654  INR 1.16    BMP:  Recent Labs  11/12/16 0325 11/13/16 0345 11/27/16 1700 11/28/16 0406  NA 136 136 138 137  K 4.7 5.3* 4.8 4.5  CL 103 104 102 103  CO2 _0 GLUCOSE 156* 134* 92 102*  BUN 42* 42* 30* 32*  CALCIUM 8.3* 8.1* 8.3* 8.0*  CREATININE 1.12 1.18 1.33* 1.40*  GFRNONAA 56* 53* 46* 43*  GFRAA >60 >60 53* 50*    LIVER FUNCTION TESTS:  Recent Labs  10/30/16 0307 11/10/16 1755 11/12/16 0325 11/27/16 1700  BILITOT 0.7 0.5 0.6 0.8  AST _1 ALT 13* 23 20 15*  ALKPHOS 35* 44 39 41  PROT 6.2* 6.2* 5.8* 6.1*  ALBUMIN 2.9* 3.3* 2.9* 2.7*    TUMOR MARKERS: No results for input(s): AFPTM, CEA, CA199, CHROMGRNA in the last 8760 hours.  Assessment and Plan: Pancytopenia Patient with past medical history of severe COPD admitted with dyspnea, fatigue, and hypoxemia. He also has a history of pancytopenia for which was previously undergoing outpatient work up. Note patient was planning for bone marrow biopsy as an outpatient on 12/08/16.  IR consulted for bone marrow biopsy at the request of Dr. Lake Bells. Pathology not available until next week for sample collection.  Plan to re-evaluate next week for medical status and ability to undergo procedure.  This was discussed with the family and his family at bedside.  They voice understanding of plan.  Note platelets 45 today.  Will trend.   Thank you for this  interesting consult.  I greatly enjoyed meeting [name] Azure, MD and look  forward to participating in their care.  A copy of this report was sent to the requesting provider on this date.  Electronically Signed: Docia Barrier, PA 11/28/2016, 4:20 PM   I spent a total of 40 Minutes    in face to face in clinical consultation, greater than 50% of which was counseling/coordinating care for pancytopenia.

## 2016-11-28 NOTE — Progress Notes (Signed)
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    SUBJECTIVE:  Feels rough Daughter notes he chokes on food Fatigued Didn't sleep well  VITAL SIGNS: BP (!) 110/59 (BP Location: Right Arm)   Pulse 68   Temp 99.4 F (37.4 C) (Oral)   Resp 18   Ht 6' (1.829 m)   Wt 72.1 kg (158 lb 15.2 oz)   SpO2 100%   BMI 21.56 kg/m   HEMODYNAMICS:    VENTILATOR SETTINGS:    INTAKE / OUTPUT: I/O last 3 completed shifts: In: 1430 [P.O.:120; I.V.:120; Blood:990; IV Piggyback:200] Out: 810 [Urine:810]  PHYSICAL EXAMINATION:  General: Frail, cachectic male resting comfortably in bed HENT: NCAT OP clear PULM: Wheezing R>L bilaterally, poor air movement, normal effort CV: RRR, no mgr GI: BS+, soft, nontender MSK: normal bulk and tone Neuro: awake, alert, no distress, MAEW   LABS:  BMET  Recent Labs Lab 11/27/16 1700 11/28/16 0406  NA 138 137  K 4.8 4.5  CL 102 103  CO2 27 24  BUN 30* 32*  CREATININE 1.33* 1.40*  GLUCOSE 92 102*    Electrolytes  Recent Labs Lab 11/27/16 1700 11/28/16 0406  CALCIUM 8.3* 8.0*    CBC  Recent Labs Lab 11/24/16 1617 11/27/16 1700 11/28/16 0406  WBC 4.1 4.2 3.3*  HGB 7.5* 6.7* 6.9*  HCT 24.1* 20.5* 20.9*  PLT 45* 58* 45*    Coag's No results for input(s): APTT, INR in the last 168 hours.  Sepsis Markers No results for input(s): LATICACIDVEN, PROCALCITON, O2SATVEN in the last 168 hours.  ABG No results for input(s): PHART, PCO2ART, PO2ART in the last 168 hours.  Liver Enzymes  Recent Labs Lab 11/27/16 1700  AST 16  ALT 15*  ALKPHOS 41  BILITOT 0.8  ALBUMIN 2.7*    Cardiac Enzymes  Recent  Labs Lab 11/27/16 1700  TROPONINI 0.03*    Glucose  Recent Labs Lab 11/28/16 0809  GLUCAP 99    Imaging X-ray Chest Pa And Lateral  Result Date: 11/27/2016 CLINICAL DATA:  Increased weakness, fever and dyspnea over the past several days. EXAM: CHEST  2 VIEW COMPARISON:  11/10/2016. FINDINGS: Heart is within normal limits. Tortuous thoracic aorta with atherosclerosis. Left-sided ICD device projects over the shoulder with leads in the right atrium and right ventricle. Emphysematous hyperinflation of the lungs. Mild increase in vascular congestion is noted since previous study. No effusion or pneumothorax. No acute osseous abnormality. Degenerative changes are noted about the [facility] and glenohumeral joints. IMPRESSION: 1. COPD with slight increase in vascular congestion since prior exam. No pneumothorax or pneumonic consolidation. 2. Aortic atherosclerosis. Electronically Signed   By: Ashley Royalty M.D.   On: 11/27/2016 20:14     STUDIES:  11/1 CXR > severe emphysema, ? Vascular congestion  CULTURES: 11/1 blood >  11/1 urine >   ANTIBIOTICS: 11/1 vanc x1 11/1 cefepime x1   SIGNIFICANT EVENTS:   LINES/TUBES:   DISCUSSION: 83 y/o male with severe COPD admitted 11/1 with fever and weakness in the setting of multiple recent hospitalizations and unexplained pancytopenia.  ASSESSMENT / PLAN:  PULMONARY A: Acute on Chronic respiratory failure with hypoxemia Severe emphysema COPD exacerbation  Likely viral bronchitis P:   Stop antibiotics Add solumedrol Continue Brovana Continue Pulmicort Continue albuterol as needed  CARDIOVASCULAR A:  History of nonischemic cardiomyopathy, 2017 LVEF 55% P:  Tele Continue home antihypertensive regimen Continue metoprolol Continue zocor Lasix x1 dose  RENAL A:   CKD P:   Monitor BMET and UOP Replace electrolytes as needed  GASTROINTESTINAL A:   Dysphagia? Weight loss P:   Regular diet for now  HEMATOLOGIC A:    Pancytopenia P:  Transfuse 1 U PRBC  INFECTIOUS A:   Fever, no pneumonia ?viral prodrome P:   Check viral panel Droplet precautions  ENDOCRINE A:   DM2 P:   SSI, monitor glucose  NEUROLOGIC A:   Generalized weakness P:   PT consult this weekend   FAMILY  - Updates: Updated daughter [name] at length on November 28, 2016.  I advised that I am concerned about his overall condition as he has been hospitalized now 3 times in the last month.  Combination of severe COPD and hypoxemia and pancytopenia is never a good thing and will certainly cause severe weakness.  I advised that should his condition deteriorate he would not do well with life support.  When we discussed this with the patient he said that he wanted his CODE STATUS to be DO NOT RESUSCITATE.  - Inter-disciplinary family meet or Palliative Care meeting due by: Day 7  Roselie Awkward, MD [facility] Pager: [phone] Cell: [phone] After 3pm or if no response, call [phone]  11/28/2016, 11:33 AM

## 2016-11-28 NOTE — Progress Notes (Signed)
[facility]  Patient Status: Active (receiving services up to time of hospitalization)  [facility] is providing the following services: RN, PT, OT and HHA  If patient discharges after hours, please call [phone].   [name] [name] 11/28/2016, 9:30 AM

## 2016-11-28 NOTE — Progress Notes (Signed)
Note reviewed.  Sonia Baller Ashok Cordia, M.D. [facility] Pager:  [phone] After 7pm or if no response, call [phone] 9:25 AM 11/28/16

## 2016-11-28 NOTE — Progress Notes (Signed)
Pt active with [facility] for HHRN/PT/OT/Aide and has 24hr caregivers. Pt also has home 02 from [facility]. Will need resumption orders for [facility] at discharge. Marney Doctor RN,BSN,NCM [phone]

## 2016-11-29 ENCOUNTER — Inpatient Hospital Stay (HOSPITAL_COMMUNITY): Payer: Medicare Other

## 2016-11-29 DIAGNOSIS — J432 Centrilobular emphysema: Secondary | ICD-10-CM

## 2016-11-29 DIAGNOSIS — D509 Iron deficiency anemia, unspecified: Secondary | ICD-10-CM

## 2016-11-29 DIAGNOSIS — I509 Heart failure, unspecified: Secondary | ICD-10-CM

## 2016-11-29 DIAGNOSIS — E538 Deficiency of other specified B group vitamins: Secondary | ICD-10-CM

## 2016-11-29 DIAGNOSIS — E611 Iron deficiency: Secondary | ICD-10-CM

## 2016-11-29 DIAGNOSIS — J449 Chronic obstructive pulmonary disease, unspecified: Secondary | ICD-10-CM

## 2016-11-29 DIAGNOSIS — D472 Monoclonal gammopathy: Secondary | ICD-10-CM

## 2016-11-29 DIAGNOSIS — I2510 Atherosclerotic heart disease of native coronary artery without angina pectoris: Secondary | ICD-10-CM

## 2016-11-29 DIAGNOSIS — Z950 Presence of cardiac pacemaker: Secondary | ICD-10-CM

## 2016-11-29 DIAGNOSIS — N289 Disorder of kidney and ureter, unspecified: Secondary | ICD-10-CM

## 2016-11-29 LAB — TYPE AND SCREEN
ABO/RH(D): A POS
Antibody Screen: NEGATIVE
UNIT DIVISION: 0
UNIT DIVISION: 0

## 2016-11-29 LAB — BPAM RBC
BLOOD PRODUCT EXPIRATION DATE: 201811152359
BLOOD PRODUCT EXPIRATION DATE: 201811202359
ISSUE DATE / TIME: 201811021506
ISSUE DATE / TIME: 201811012216
UNIT TYPE AND RH: 6200
UNIT TYPE AND RH: 6200

## 2016-11-29 LAB — CBC
HEMATOCRIT: 24.7 % — AB (ref 39.0–52.0)
Hemoglobin: 8.1 g/dL — ABNORMAL LOW (ref 13.0–17.0)
MCH: 24.8 pg — AB (ref 26.0–34.0)
MCHC: 32.8 g/dL (ref 30.0–36.0)
MCV: 75.8 fL — AB (ref 78.0–100.0)
Platelets: 50 10*3/uL — ABNORMAL LOW (ref 150–400)
RBC: 3.26 MIL/uL — ABNORMAL LOW (ref 4.22–5.81)
RDW: 19.3 % — ABNORMAL HIGH (ref 11.5–15.5)
WBC: 2.9 10*3/uL — AB (ref 4.0–10.5)

## 2016-11-29 LAB — URINE CULTURE: Culture: NO GROWTH

## 2016-11-29 LAB — BASIC METABOLIC PANEL
Anion gap: 8 (ref 5–15)
BUN: 31 mg/dL — AB (ref 6–20)
CHLORIDE: 104 mmol/L (ref 101–111)
CO2: 27 mmol/L (ref 22–32)
Calcium: 8.1 mg/dL — ABNORMAL LOW (ref 8.9–10.3)
Creatinine, Ser: 1.28 mg/dL — ABNORMAL HIGH (ref 0.61–1.24)
GFR calc Af Amer: 55 mL/min — ABNORMAL LOW (ref >60)
GFR calc non Af Amer: 48 mL/min — ABNORMAL LOW (ref >60)
Glucose, Bld: 107 mg/dL — ABNORMAL HIGH (ref 65–99)
POTASSIUM: 4.1 mmol/L (ref 3.5–5.1)
Sodium: 139 mmol/L (ref 135–145)

## 2016-11-29 LAB — FERRITIN: Ferritin: 1000 ng/mL — ABNORMAL HIGH (ref 24–336)

## 2016-11-29 LAB — IRON AND TIBC
Iron: 20 ug/dL — ABNORMAL LOW (ref 45–182)
SATURATION RATIOS: 12 % — AB (ref 17.9–39.5)
TIBC: 161 ug/dL — AB (ref 250–450)
UIBC: 141 ug/dL

## 2016-11-29 LAB — GLUCOSE, CAPILLARY: GLUCOSE-CAPILLARY: 102 mg/dL — AB (ref 65–99)

## 2016-11-29 LAB — VITAMIN B12: VITAMIN B 12: 708 pg/mL (ref 180–914)

## 2016-11-29 MED ORDER — ALBUTEROL SULFATE (2.5 MG/3ML) 0.083% IN NEBU
2.5000 mg | INHALATION_SOLUTION | RESPIRATORY_TRACT | Status: DC
  Administered 2016-11-29 – 2016-12-04 (×29): 2.5 mg via RESPIRATORY_TRACT
  Filled 2016-11-29 (×30): qty 3

## 2016-11-29 MED ORDER — SODIUM CHLORIDE 0.9 % IV SOLN
40.0000 mg | Freq: Once | INTRAVENOUS | Status: AC
  Administered 2016-11-29: 40 mg via INTRAVENOUS
  Filled 2016-11-29: qty 4

## 2016-11-29 MED ORDER — FUROSEMIDE 10 MG/ML IJ SOLN
40.0000 mg | Freq: Once | INTRAMUSCULAR | Status: AC
  Administered 2016-11-29: 40 mg via INTRAVENOUS
  Filled 2016-11-29: qty 4

## 2016-11-29 MED ORDER — METHYLPREDNISOLONE SODIUM SUCC 125 MG IJ SOLR
60.0000 mg | Freq: Once | INTRAMUSCULAR | Status: AC
  Administered 2016-11-29: 60 mg via INTRAVENOUS
  Filled 2016-11-29: qty 2

## 2016-11-29 MED ORDER — MAGNESIUM HYDROXIDE 400 MG/5ML PO SUSP
15.0000 mL | Freq: Every day | ORAL | Status: DC | PRN
  Administered 2016-11-29: 15 mL via ORAL
  Filled 2016-11-29: qty 30

## 2016-11-29 MED ORDER — FERUMOXYTOL INJECTION 510 MG/17 ML
510.0000 mg | Freq: Once | INTRAVENOUS | Status: AC
  Administered 2016-11-29: 510 mg via INTRAVENOUS
  Filled 2016-11-29: qty 17

## 2016-11-29 MED ORDER — FLEET ENEMA 7-19 GM/118ML RE ENEM
1.0000 | ENEMA | Freq: Once | RECTAL | Status: AC
  Administered 2016-11-30: 1 via RECTAL
  Filled 2016-11-29: qty 1

## 2016-11-29 NOTE — Consult Note (Signed)
Referral MD  Reason for Referral: Pancytopenia; iron deficiency anemia  No chief complaint on file. : My blood has been off for several years.  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  He is incredibly interesting.  He is from [place].  I asked him if he knew who the "[employer]" was.  Unfortunately he did not know who he was.  By the way, he was Lum Keas, the first African-American to when the [place] trophy.  He was in the [employer].  He was in [place].  He was in the infantry.  As such, he is a true American hero.  I let him know this.  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  Unfortunately, he has a lot of health issues now.  He has a Investment banker, corporate implanted.  He has congestive heart failure, COPD, coronary artery disease.  He currently is in the ICU.  He has seen Dr. [name] of our group previously.  He recommended that [name] have a bone marrow test done.  He did find that there was low B12 and put him on some vitamin B12.  On November 1, his hemoglobin was 6.7.  MCV was 71.4.  Platelet count 58,000.  Yesterday, his white cell count 3.3.  Hemoglobin 6.9.  Platelet count 45,000.  He is gotten 1 unit of blood.  Today, his hemoglobin is 8.1.  White cell count 2.9 and platelet count 50,000.  His corrected reticulocyte count is almost 0.  He does have iron deficiency.  His ferritin was 1000 but iron saturation was only 12%.  I will go ahead and give him some IV iron.  He did have myeloma studies done about 3 weeks ago.  He did have an M spike of 0.9 g/dL.  His IgA level was elevated at 934 mg/dL.  His Kappa Lightchain was 22.1 mg/dL.  He is somewhat hard of hearing.  He does have some mild renal insufficiency.  I have checking his erythropoietin level.  Overall, his performance status is ECOG 3.    Past Medical History:  Diagnosis Date  . Anemia, unspecified   . CAD (coronary artery disease)   . Cataracts, bilateral    . CHF (congestive heart failure) ([place])   . Chronic airway obstruction, not elsewhere classified    on o2 at night  . Diabetes mellitus   . Diverticulosis of colon (without mention of hemorrhage)   . Dyslipidemia   . Emphysema   . Hemorrhoids   . ICD (implantable cardiac defibrillator) in place    [facility]  . Ischemic cardiomyopathy   . Macular degeneration   . MI, old 2010 or 2011  . Prostatic hypertrophy    hx of  . Retention of urine, unspecified   . Unspecified disorder resulting from impaired renal function   :  Past Surgical History:  Procedure Laterality Date  . CARDIAC DEFIBRILLATOR PLACEMENT  2011   [facility]  . CATARACT EXTRACTION    . PACEMAKER INSERTION  2011  . pci     4/11  . TONSILLECTOMY  83 yo  :   Current Facility-Administered Medications:  .  0.9 %  sodium chloride infusion, 250 mL, Intravenous, PRN, Parrett, Tammy S, NP .  0.9 %  sodium chloride infusion, , Intravenous, Continuous, Parrett, Tammy S, NP, Last Rate: 10 mL/hr at 11/29/16 0600 .  0.9 %  sodium chloride infusion, , Intravenous, Once, Parrett, Tammy S, NP .  acetaminophen (  TYLENOL) tablet 650 mg, 650 mg, Oral, Q6H PRN **OR** acetaminophen (TYLENOL) suppository 650 mg, 650 mg, Rectal, Q6H PRN, Parrett, Tammy S, NP .  albuterol (PROVENTIL) (2.5 MG/3ML) 0.083% nebulizer solution 2.5 mg, 2.5 mg, Nebulization, Q2H PRN, Parrett, Tammy S, NP, 2.5 mg at 11/29/16 0443 .  albuterol (PROVENTIL) (2.5 MG/3ML) 0.083% nebulizer solution 2.5 mg, 2.5 mg, Nebulization, Q4H, McQuaid, Douglas B, MD .  ALPRAZolam Duanne Moron) tablet 0.25 mg, 0.25 mg, Oral, QHS PRN, Parrett, Tammy S, NP, 0.25 mg at 11/27/16 2130 .  arformoterol (BROVANA) nebulizer solution 15 mcg, 15 mcg, Nebulization, BID, Parrett, Tammy S, NP, 15 mcg at 11/29/16 2458 .  budesonide (PULMICORT) nebulizer solution 0.5 mg, 0.5 mg, Nebulization, BID, Parrett, Tammy S, NP, 0.5 mg at 11/29/16 0814 .  cyanocobalamin ((VITAMIN B-12)) injection 1,000 mcg, 1,000  mcg, Intramuscular, QPC breakfast, Climmie Cronce, Rudell Cobb, MD .  docusate sodium (COLACE) capsule 100 mg, 100 mg, Oral, BID PRN, Javier Glazier, MD .  fluticasone (FLONASE) 50 MCG/ACT nasal spray 1 spray, 1 spray, Each Nare, Daily, Parrett, Tammy S, NP, 1 spray at 11/28/16 1043 .  guaiFENesin (MUCINEX) 12 hr tablet 600 mg, 600 mg, Oral, BID, Parrett, Tammy S, NP, 600 mg at 11/28/16 2230 .  LORazepam (ATIVAN) injection 0.5-1 mg, 0.5-1 mg, Intravenous, Q4H PRN, Simonne Maffucci B, MD, 1 mg at 11/28/16 1700 .  metoprolol tartrate (LOPRESSOR) tablet 12.5 mg, 12.5 mg, Oral, BID, Parrett, Tammy S, NP, 12.5 mg at 11/28/16 2230 .  ondansetron (ZOFRAN) tablet 4 mg, 4 mg, Oral, Q6H PRN **OR** ondansetron (ZOFRAN) injection 4 mg, 4 mg, Intravenous, Q6H PRN, Parrett, Tammy S, NP .  pantoprazole (PROTONIX) EC tablet 40 mg, 40 mg, Oral, Daily, Parrett, Tammy S, NP, 40 mg at 11/28/16 1046 .  rosuvastatin (CRESTOR) tablet 5 mg, 5 mg, Oral, Daily, Parrett, Tammy S, NP, 5 mg at 11/28/16 1046 .  senna-docusate (Senokot-S) tablet 1 tablet, 1 tablet, Oral, QHS PRN, Parrett, Tammy S, NP .  sodium chloride flush (NS) 0.9 % injection 3 mL, 3 mL, Intravenous, Q12H, Parrett, Tammy S, NP, 3 mL at 11/28/16 2232 .  sodium chloride flush (NS) 0.9 % injection 3 mL, 3 mL, Intravenous, PRN, Parrett, Tammy S, NP .  vitamin C (ASCORBIC ACID) tablet 250 mg, 250 mg, Oral, BID, Parrett, Tammy S, NP, 250 mg at 11/28/16 2230:  . albuterol  2.5 mg Nebulization Q4H  . arformoterol  15 mcg Nebulization BID  . budesonide  0.5 mg Nebulization BID  . cyanocobalamin  1,000 mcg Intramuscular QPC breakfast  . fluticasone  1 spray Each Nare Daily  . guaiFENesin  600 mg Oral BID  . metoprolol tartrate  12.5 mg Oral BID  . pantoprazole  40 mg Oral Daily  . rosuvastatin  5 mg Oral Daily  . sodium chloride flush  3 mL Intravenous Q12H  . ascorbic acid  250 mg Oral BID  :  No Known Allergies:  Family History  Problem Relation Age of Onset  .  Coronary artery disease Mother   . Stroke Mother   . Congestive Heart Failure Mother   . Lung cancer Father        smoker  . Breast cancer Sister   . Atrial fibrillation Daughter   . Heart attack Brother   :  Social History   Social History  . Marital status: Widowed    Spouse name: N/A  . Number of children: N/A  . Years of education: N/A   Occupational History  .  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 Smoking status: Former Smoker    Types: Cigarettes    Quit date: 04/27/2009  . Smokeless tobacco: Never Used  . Alcohol use Yes     Comment: occasional beer  . Drug use: No  . Sexual activity: No   Other Topics Concern  . Not on file   Social History Narrative   Lives in a house with stairs, which he needs to use, with his wife.  Wife is not able-bodied.  Does not use a cane or walker, but is unsteady on his feet.        [name] Sandefer:  [phone] cell, [phone], daughter, POA.     Boyd Kerbs:  [phone], nephew   Juluis Pitch:  [phone], daughter           :  Pertinent items are noted in HPI.  Exam: Patient Vitals for the past 24 hrs:  BP Temp Temp src Pulse Resp SpO2 Height Weight  11/29/16 0822 - - - - - 99 % - -  11/29/16 0814 - - - - - 99 % - -  11/29/16 0810 - - - - - 97 % - -  11/29/16 0400 (!) 113/45 98 F (36.7 C) Oral (!) 55 (!) 22 100 % - -  11/29/16 0348 - 98 F (36.7 C) Oral - - - - 161 lb 13.1 oz (73.4 kg)  11/29/16 0300 (!) 123/48 - - 62 20 98 % - -  11/29/16 0200 (!) 115/47 - - (!) 58 (!) 22 99 % - -  11/29/16 0100 (!) 95/32 - - 60 17 97 % - -  11/29/16 0004 - 98.2 F (36.8 C) Oral - - - - -  11/29/16 0000 (!) 105/37 99 F (37.2 C) Oral 63 (!) 22 97 % - -  11/28/16 2300 (!) 113/40 - - 71 (!) 29 99 % - -  11/28/16 2200 (!) 105/52 - - 69 (!) 30 96 % - -  11/28/16 2100 (!) 89/23 - - 74 (!) 28 97 % - -  11/28/16 2000 (!) 118/49 99.1 F (37.3 C) Oral 89 (!) 30 97 % - -  11/28/16 1937 - 99.1 F (37.3 C)  Oral - - - - -  11/28/16 1911 - - - - - 96 % - -  11/28/16 1900 (!) 108/41 - - 89 (!) 27 98 % - -  11/28/16 1843 (!) 159/140 99 F (37.2 C) Oral 96 (!) 30 99 % - -  11/28/16 1756 - - - (!) 117 (!) 39 100 % - -  11/28/16 1700 (!) 109/53 - - 76 (!) 21 96 % - -  11/28/16 1600 117/60 - - 78 (!) 24 92 % - -  11/28/16 1523 - - - 66 (!) 26 100 % - -  11/28/16 1500 (!) 128/45 98.7 F (37.1 C) Oral 78 (!) 25 92 % - -  11/28/16 1313 (!) 128/45 98.7 F (37.1 C) Oral 72 (!) 28 96 % - -  11/28/16 1310 - - - - - - 6' (1.829 m) 158 lb 1.1 oz (71.7 kg)  11/28/16 1228 - - - - - 100 % - -  11/28/16 1040 (!) 110/59 99.4 F (37.4 C) Oral 68 18 100 % - -   As above   Recent Labs  11/28/16 0406 11/29/16 0349  WBC 3.3* 2.9*  HGB 6.9* 8.1*  HCT 20.9* 24.7*  PLT 45* 50*  Recent Labs  11/28/16 0406 11/29/16 0349  NA 137 139  K 4.5 4.1  CL 103 104  CO2 24 27  GLUCOSE 102* 107*  BUN 32* 31*  CREATININE 1.40* 1.28*  CALCIUM 8.0* 8.1*    Blood smear review: Hypochromic and microcytic red blood cells.  There are no nucleated red blood cells.  I see no teardrop cells.  He has some rouleaux formation.  White cells are decreased in number.  There are no immature myeloid cells.  I see no hypersegmented polys.  Platelets are decreased in number.  Platelets are small.  Platelets are well granulated.  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  He has multiple medical problems.  He has pancytopenia.  I think that [name] probably has a multifactorial etiology for his pancytopenia.  At his age, myelodysplasia is always a possibility.  His reticulocyte count is nonexistent so his bone marrow just is not getting any "messages" to make blood cells.  Again we will see what his erythropoietin level is.  He does have an M spike.  At his age, it is possible that his MGUS is "benign".  However, given the elevated IgA level and the Kappa Lightchain elevation, we  might be looking at a plasma cell disorder.  He is iron deficient.  We will see how IV iron works.  A bone marrow biopsy will definitely give [place] the diagnosis.  I suspect that chromosome analysis will be critical.  His granddaughter was in the room with [place].  He does have a decent understanding of what is going on.  We will follow along.  Transfusing him is a little bit tricky given his history of congestive heart failure.  I know that he is getting fantastic care by the wonderful staff in the ICU.  Lattie Haw, MD  Darlyn Chamber 17:14

## 2016-11-29 NOTE — Progress Notes (Signed)
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    SUBJECTIVE:  Little better this morning Had an episode of severe anxiety last night, required BIPAP, ativan  VITAL SIGNS: BP (!) 113/45 (BP Location: Left Arm)   Pulse (!) 55   Temp 98.7 F (37.1 C) (Oral)   Resp (!) 22   Ht 6' (1.829 m)   Wt 73.4 kg (161 lb 13.1 oz)   SpO2 99%   BMI 21.95 kg/m   HEMODYNAMICS:    VENTILATOR SETTINGS: FiO2 (%):  [50 %] 50 %  INTAKE / OUTPUT: I/O last 3 completed shifts: In: 2137.2 [P.O.:120; I.V.:512.2; Blood:1305; IV Piggyback:200] Out: 3210 [Urine:3210]  PHYSICAL EXAMINATION:  General:  Frail, elderly male resting comfortably in bed HENT: NCAT OP clear PULM: CTA B, normal effort CV: RRR, no mgr GI: BS+, soft, nontender MSK: normal bulk and tone Neuro: awake, alert, no distress, MAEW     LABS:  BMET  Recent Labs Lab 11/27/16 1700 11/28/16 0406 11/29/16 0349  NA 138 137 139  K 4.8 4.5 4.1  CL 102 103 104  CO2 _0 BUN 30* 32* 31*  CREATININE 1.33* 1.40* 1.28*  GLUCOSE 92 102* 107*    Electrolytes  Recent Labs Lab 11/27/16 1700 11/28/16 0406 11/29/16 0349  CALCIUM 8.3* 8.0* 8.1*    CBC  Recent Labs Lab 11/27/16 1700 11/28/16 0406 11/29/16 0349  WBC 4.2 3.3* 2.9*  HGB 6.7* 6.9* 8.1*  HCT 20.5* 20.9* 24.7*  PLT 58* 45* 50*    Coag's No results for input(s): APTT, INR in the last 168 hours.  Sepsis Markers No results for input(s): LATICACIDVEN, PROCALCITON, O2SATVEN in the last 168 hours.  ABG No results for input(s): PHART, PCO2ART, PO2ART in the last 168 hours.  Liver Enzymes  Recent Labs Lab 11/27/16 1700  AST  16  ALT 15*  ALKPHOS 41  BILITOT 0.8  ALBUMIN 2.7*    Cardiac Enzymes  Recent Labs Lab 11/27/16 1700  TROPONINI 0.03*    Glucose  Recent Labs Lab 11/28/16 0809 11/29/16 0803  GLUCAP 99 102*    Imaging Dg Chest Port 1 View  Result Date: 11/29/2016 CLINICAL DATA:  Acute respiratory failure with hypoxemia. Ex-smoker. EXAM: PORTABLE CHEST 1 VIEW COMPARISON:  11/27/2016 and 11/10/2016. FINDINGS: Stable left subclavian pacemaker leads. Normal sized heart. The aorta remains diffusely enlarged and tortuous a partially calcified. Stable hyperexpansion of the lungs. Increased interstitial markings in the right mid and upper lung zone compared to 11/10/2016. Diffuse osteopenia. Superior migration of both humeral heads with bony remodeling. IMPRESSION: 1. Mild interstitial lung disease in the right mid and upper lung zones, new since 11/10/2016. This has an appearance most compatible with interstitial pneumonitis. Asymmetrical interstitial pulmonary edema is less likely. 2. Stable changes of COPD. 3. Stable diffusely enlarged, tortuous and partially calcified thoracic aorta. 4. Bilateral large, chronic rotator cuff tears. Electronically Signed   By: Claudie Revering M.D.   On: 11/29/2016 07:52     STUDIES:  11/1 CXR > severe emphysema, ? Vascular congestion  CULTURES: 11/1 blood >  11/1 urine >  11/2 resp viral panel negative  ANTIBIOTICS: 11/1 vanc x1 11/1 cefepime  x1   SIGNIFICANT EVENTS:   LINES/TUBES:   DISCUSSION: 83 y/o male with severe COPD admitted 11/1 with fever and weakness in the setting of multiple recent hospitalizations and unexplained pancytopenia.  Had vocal cord dysfunction and anxiety on 11/2.  In general looks better today post transfusion yesterday.  ASSESSMENT / PLAN:  PULMONARY A: Acute on Chronic respiratory failure with hypoxemia Severe anxiety, vocal cord dysfunction Severe emphysema COPD exacerbation Likely viral bronchitis P:   Monitor off of  antibiotics Continue solumedrol Continue PRN BIPAP Continue brovana Continue pulmicort Continue scheduled q4 hr albuterol Continue supplemental O2 to maintain O2 saturation > 88%  CARDIOVASCULAR A:  History of nonischemic cardiomyopathy, 2017 LVEF 55% P:  Tele Continue zocor Lasix again today x1 dose  RENAL A:   CKD BPH, multiple in/out caths (home regimen) P:   Monitor BMET and UOP Replace electrolytes as needed Maintain foley for now given diuretics  GASTROINTESTINAL A:   Dysphagia? Weight loss P:   cotinue regular diet Monitor for aspiration  HEMATOLOGIC A:   Pancytopenia P:  Bone marrow biopsy on 11/5 Hematology consult appreciated  INFECTIOUS A:   Fever, no pneumonia P:   Monitor for fever  ENDOCRINE A:   DM2 P:   SSI, monitor glucose  NEUROLOGIC A:   Generalized weakness Anxiety P:   PT consult today Prn xanax  CODE STATUS DNR  FAMILY  - Updates: stephanie updated at length on 11/3  - Inter-disciplinary family meet or Palliative Care meeting due by: Day 7  Roselie Awkward, MD [place] Pager: [phone] Cell: [phone] After 3pm or if no response, call [phone]  11/29/2016, 10:25 AM

## 2016-11-29 NOTE — Progress Notes (Signed)
Late entry  Called emergently to bedside on 11/2 at 1800 for severe tachypnea, hypoxemia, tachycardia  On exam Anxious Tachycardia Loud upper airway wheeze, normal air movement, no lower airway wheezing, no crackles in lungs  Impression: Anxiety Vocal cord dysfunction  Plan: Ativan IV BIPAP Schedule albuterol  Spent 32 minutes directly with patient counseling him to slow breathing, directing care and updating family, starting BIPAP, changing orders for albuterol  Roselie Awkward, MD [place] [facility] Pager: [phone] Cell: [phone] After 3pm or if no response, call [phone]

## 2016-11-29 NOTE — Progress Notes (Signed)
eLink Physician-Brief Progress Note Patient Name: Antonia Culbertson, MD DOB: 22-Jan-1926 MRN: [id number]   Date of Service  11/29/2016  HPI/Events of Note  Constipation.   eICU Interventions  Will order Fleets enema now.      Intervention Category Major Interventions: Other:  Lysle Dingwall 11/29/2016, 11:21 PM

## 2016-11-30 ENCOUNTER — Other Ambulatory Visit: Payer: Self-pay

## 2016-11-30 ENCOUNTER — Inpatient Hospital Stay (HOSPITAL_COMMUNITY): Payer: Medicare Other

## 2016-11-30 DIAGNOSIS — D619 Aplastic anemia, unspecified: Secondary | ICD-10-CM

## 2016-11-30 DIAGNOSIS — R0602 Shortness of breath: Secondary | ICD-10-CM

## 2016-11-30 DIAGNOSIS — D469 Myelodysplastic syndrome, unspecified: Secondary | ICD-10-CM

## 2016-11-30 DIAGNOSIS — J8489 Other specified interstitial pulmonary diseases: Secondary | ICD-10-CM

## 2016-11-30 LAB — COMPREHENSIVE METABOLIC PANEL
ALK PHOS: 42 U/L (ref 38–126)
ALT: 15 U/L — ABNORMAL LOW (ref 17–63)
ANION GAP: 9 (ref 5–15)
AST: 15 U/L (ref 15–41)
Albumin: 2.2 g/dL — ABNORMAL LOW (ref 3.5–5.0)
BILIRUBIN TOTAL: 0.4 mg/dL (ref 0.3–1.2)
BUN: 43 mg/dL — ABNORMAL HIGH (ref 6–20)
CO2: 29 mmol/L (ref 22–32)
Calcium: 8.5 mg/dL — ABNORMAL LOW (ref 8.9–10.3)
Chloride: 102 mmol/L (ref 101–111)
Creatinine, Ser: 1.34 mg/dL — ABNORMAL HIGH (ref 0.61–1.24)
GFR calc Af Amer: 52 mL/min — ABNORMAL LOW (ref >60)
GFR, EST NON AFRICAN AMERICAN: 45 mL/min — AB (ref >60)
Glucose, Bld: 94 mg/dL (ref 65–99)
POTASSIUM: 3.7 mmol/L (ref 3.5–5.1)
Sodium: 140 mmol/L (ref 135–145)
Total Protein: 5.6 g/dL — ABNORMAL LOW (ref 6.5–8.1)

## 2016-11-30 LAB — CBC WITH DIFFERENTIAL/PLATELET
Basophils Absolute: 0 10*3/uL (ref 0.0–0.1)
Basophils Relative: 0 %
Eosinophils Absolute: 0 10*3/uL (ref 0.0–0.7)
Eosinophils Relative: 0 %
HEMATOCRIT: 25.7 % — AB (ref 39.0–52.0)
HEMOGLOBIN: 8.2 g/dL — AB (ref 13.0–17.0)
LYMPHS PCT: 18 %
Lymphs Abs: 0.6 10*3/uL — ABNORMAL LOW (ref 0.7–4.0)
MCH: 24.1 pg — ABNORMAL LOW (ref 26.0–34.0)
MCHC: 31.9 g/dL (ref 30.0–36.0)
MCV: 75.6 fL — AB (ref 78.0–100.0)
MONO ABS: 0.1 10*3/uL (ref 0.1–1.0)
MONOS PCT: 2 %
NEUTROS ABS: 2.5 10*3/uL (ref 1.7–7.7)
Neutrophils Relative %: 80 %
Platelets: 48 10*3/uL — ABNORMAL LOW (ref 150–400)
RBC: 3.4 MIL/uL — ABNORMAL LOW (ref 4.22–5.81)
RDW: 19.5 % — AB (ref 11.5–15.5)
WBC: 3.2 10*3/uL — ABNORMAL LOW (ref 4.0–10.5)

## 2016-11-30 LAB — GLUCOSE, CAPILLARY: GLUCOSE-CAPILLARY: 100 mg/dL — AB (ref 65–99)

## 2016-11-30 NOTE — Progress Notes (Signed)
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    SUBJECTIVE:  Some constipation Breathing about the same  VITAL SIGNS: BP (!) 101/47   Pulse (!) 55   Temp 98.7 F (37.1 C) (Oral)   Resp 20   Ht 6' (1.829 m)   Wt 72.7 kg (160 lb 4.4 oz)   SpO2 96%   BMI 21.74 kg/m   HEMODYNAMICS:    VENTILATOR SETTINGS:    INTAKE / OUTPUT: I/O last 3 completed shifts: In: 500 [I.V.:383; IV Piggyback:117] Out: 900 [Urine:900]  PHYSICAL EXAMINATION:  General:  Chronically ill appearing resting comfortably in bed HENT: NCAT OP clear PULM: poor air movement, normal effort CV: RRR, no mgr GI: BS+, soft, nontender MSK: normal bulk and tone Neuro: awake, alert, no distress, MAEW    LABS:  BMET Recent Labs  Lab 11/28/16 0406 11/29/16 0349 11/30/16 0833  NA 137 139 140  K 4.5 4.1 3.7  CL 103 104 102  CO2 '24 27 29  '$ BUN 32* 31* 43*  CREATININE 1.40* 1.28* 1.34*  GLUCOSE 102* 107* 94    Electrolytes Recent Labs  Lab 11/28/16 0406 11/29/16 0349 11/30/16 0833  CALCIUM 8.0* 8.1* 8.5*    CBC Recent Labs  Lab 11/28/16 0406 11/29/16 0349 11/30/16 0833  WBC 3.3* 2.9* 3.2*  HGB 6.9* 8.1* 8.2*  HCT 20.9* 24.7* 25.7*  PLT 45* 50* 48*    Coag's No results for input(s): APTT, INR in the last 168 hours.  Sepsis Markers No results for input(s): LATICACIDVEN, PROCALCITON, O2SATVEN in the last 168 hours.  ABG No results for input(s): PHART, PCO2ART, PO2ART in the last 168 hours.  Liver Enzymes Recent Labs  Lab 11/27/16 1700 11/30/16 0833  AST 16 15  ALT 15* 15*  ALKPHOS 41 42  BILITOT 0.8 0.4  ALBUMIN 2.7* 2.2*    Cardiac Enzymes Recent  Labs  Lab 11/27/16 1700  TROPONINI 0.03*    Glucose Recent Labs  Lab 11/28/16 0809 11/29/16 0803 11/30/16 0747  GLUCAP 99 102* 100*    Imaging No results found.   STUDIES:  11/1 CXR > severe emphysema, ? Vascular congestion  CULTURES: 11/1 blood >  11/1 urine >  11/2 resp viral panel negative  ANTIBIOTICS: 11/1 vanc x1 11/1 cefepime x1   SIGNIFICANT EVENTS:   LINES/TUBES:   DISCUSSION: 83 y/o male with severe COPD admitted 11/1 with fever and weakness in the setting of multiple recent hospitalizations and unexplained pancytopenia.  Had vocal cord dysfunction and anxiety on 11/2.  In general looks better today post transfusion yesterday.  ASSESSMENT / PLAN:  PULMONARY A: Acute on Chronic respiratory failure with hypoxemia Severe anxiety, vocal cord dysfunction Severe emphysema COPD exacerbation Likely viral bronchitis P:   Monitor off antibiotics Continue Solu-Medrol As needed BiPAP Continue Brovana and Pulmicort Continue scheduled every 4-hour albuterol Continue supplemental O2 to maintain O2 saturation greater than 88%  CARDIOVASCULAR A:  History of nonischemic cardiomyopathy, 2017 LVEF 55% P:  Telemetry Continue Zocor  RENAL A:   CKD BPH, multiple in/out caths (home regimen) P:   Monitor BMET and UOP Replace electrolytes as needed Maintain Foley  GASTROINTESTINAL A:   Dysphagia? Weight loss  P:   Bowel regimen Continue regular diet Monitor for aspiration   HEMATOLOGIC A:   Pancytopenia P:  Plan bone marrow biopsy November 5 Hematology consult appreciated  INFECTIOUS A:   Fever, no pneumonia P:   Monitor for fever  ENDOCRINE A:   DM2 P:   SSI, monitor glucose   NEUROLOGIC A:   Generalized weakness Anxiety P:   PT consult Out of bed today Repeat CXR in AM   CODE STATUS DNR  FAMILY  - Updates: son Clair Gulling updated 11/4 bedside  - Inter-disciplinary family meet or Palliative Care meeting due by: Day  7  Roselie Awkward, MD [place] [facility] Pager: [phone] Cell: [phone] After 3pm or if no response, call [phone]  11/30/2016, 10:58 AM

## 2016-11-30 NOTE — Progress Notes (Signed)
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  It does not sound like he had many problems yesterday.  He got IV iron.  There are no labs back yet today.  He has had no obvious bleeding.  He had a chest x-ray yesterday.  This showed some interstitial pneumonitis.   His appetite might be a little bit better.  There is no fever.  His shortness of breath is about the same.  He is on oxygen.  By his corrected reticulocyte count, he clearly has a hypoproliferative anemia.  Will be very interesting to see what his erythropoietin level was.  There really is no change on his physical exam.  Apparently, there is a ceremony for his wife.  She passed away 2 years ago.  This is today.  He wants to be able to attend this.  Hopefully, he will be able to attend this.  His bone marrow is set up for tomorrow.  I have to suspect that there is going to be a component of myelodysplasia.  As always, he is getting fantastic care from everybody down in the ICU.  Lattie Haw, MD  [place] [phone]

## 2016-11-30 NOTE — Evaluation (Signed)
Physical Therapy Evaluation Patient Details Name: [name] Bolyard, MD MRN: [id number] DOB: 01/22/1926 Today's Date: 11/30/2016   History of Present Illness  Pt is a 83 y/o male admited with acute on chronic resp failure PMH includes DM, CKD, MI, COPD, CAD, CHF, and ischemic cardiomyopathy; this is his 3rd hospital admission in ~ 2 mos  Clinical Impression  Pt admitted with above diagnosis. Pt currently with functional limitations due to the deficits listed below (see PT Problem List). * Pt will benefit from skilled PT to increase their independence and safety with mobility to allow discharge to the venue listed below.  Will follow in acute setting; pt and family realistic regarding activity and goals  Pt tolerated OOB fairly well; VS as follows--HR  In 70s,   sats 95% -- 86%--94% on 4L O2,   RR 33--29---22     Follow Up Recommendations Home health PT;Supervision/Assistance - 24 hour    Equipment Recommendations  None recommended by PT    Recommendations for Other Services       Precautions / Restrictions Precautions Precautions: Fall Precaution Comments: Home O2 @ 2 to 3L Restrictions Weight Bearing Restrictions: No      Mobility  Bed Mobility Overal bed mobility: Needs Assistance Bed Mobility: Supine to Sit     Supine to sit: Supervision;Min guard     General bed mobility comments: for safety, pt is mildly impulsive and moves very quickly without regard for lines  Transfers Overall transfer level: Needs assistance   Transfers: Sit to/from Stand;Stand Pivot Transfers Sit to Stand: Min assist;+2 physical assistance;+2 safety/equipment Stand pivot transfers: Min assist;+2 physical assistance;+2 safety/equipment       General transfer comment: assist to rise and stabilize, cues for safety; pt moves very quickly  Ambulation/Gait                Stairs            Wheelchair Mobility    Modified Rankin (Stroke Patients Only)       Balance            Standing balance support: No upper extremity supported Standing balance-Leahy Scale: Poor                               Pertinent Vitals/Pain Pain Assessment: No/denies pain    Home Living Family/patient expects to be discharged to:: Private residence Living Arrangements: Alone Available Help at Discharge: Family;Personal care attendant;Available PRN/intermittently Type of Home: House Home Access: Stairs to enter Entrance Stairs-Rails: None Entrance Stairs-Number of Steps: 1 Home Layout: Two level;Laundry or work area in [place]: Wheelchair - manual;Shower seat;Bedside commode;Walker - 2 wheels;Electric scooter Additional Comments: Family checks in with pt multiple times throughout the day; family plans to move everything he needs to get to--to the main level; pt has lift for scooter on his car    Prior Function Level of Independence: Needs assistance   Gait / Transfers Assistance Needed: Pt reports he uses WC or desk chair for mobility 2* sore knees and breathing issues. Has scooter for mobility outside of home.   ADL's / Homemaking Assistance Needed: Has an aide that comes MWF in the afternoon to assist with cleaning and meal prep as needed. pt family plans to incr caregiver hours even though the pt does not want to pay for it  Comments: Pt still working in basement making jewelry--per previous notes     Hand Dominance  Extremity/Trunk Assessment   Upper Extremity Assessment Upper Extremity Assessment: Generalized weakness    Lower Extremity Assessment Lower Extremity Assessment: Generalized weakness       Communication   Communication: HOH  Cognition Arousal/Alertness: Awake/alert Behavior During Therapy: WFL for tasks assessed/performed Overall Cognitive Status: Within Functional Limits for tasks assessed                                 General Comments: HOH      General Comments      Exercises      Assessment/Plan    PT Assessment    PT Problem List Decreased strength;Decreased activity tolerance;Decreased balance;Cardiopulmonary status limiting activity;Decreased mobility;Decreased knowledge of use of DME;Decreased safety awareness       PT Treatment Interventions DME instruction;Therapeutic activities;Functional mobility training;Therapeutic exercise;Balance training;Neuromuscular re-education;Patient/family education;Stair training;Wheelchair mobility training    PT Goals (Current goals can be found in the Care Plan section)  Acute Rehab PT Goals Patient Stated Goal: to feel better, breathe better PT Goal Formulation: With patient/family Time For Goal Achievement: 12/12/16 Potential to Achieve Goals: Fair    Frequency Min 3X/week   Barriers to discharge Decreased caregiver support      Co-evaluation               AM-PAC PT "6 Clicks" Daily Activity  Outcome Measure Difficulty turning over in bed (including adjusting bedclothes, sheets and blankets)?: A Little Difficulty moving from lying on back to sitting on the side of the bed? : A Little Difficulty sitting down on and standing up from a chair with arms (e.g., wheelchair, bedside commode, etc,.)?: A Lot Help needed moving to and from a bed to chair (including a wheelchair)?: A Lot Help needed walking in hospital room?: A Lot Help needed climbing 3-5 steps with a railing? : A Lot 6 Click Score: 14    End of Session Equipment Utilized During Treatment: Gait belt;Oxygen Activity Tolerance: Patient tolerated treatment well;Patient limited by fatigue Patient left: in chair;with call bell/phone within reach;with family/visitor present;with chair alarm set Nurse Communication: Mobility status PT Visit Diagnosis: Unsteadiness on feet (R26.81);Muscle weakness (generalized) (M62.81)    Time: 1751-0258 PT Time Calculation (min) (ACUTE ONLY): 14 min   Charges:   PT Evaluation $PT Eval Moderate Complexity: 1  Mod     PT G CodesKenyon Ana, PT Pager: [phone] 11/30/2016   [facility] 11/30/2016, 1:46 PM

## 2016-12-01 ENCOUNTER — Encounter: Payer: Medicare Other | Admitting: *Deleted

## 2016-12-01 DIAGNOSIS — R059 Cough, unspecified: Secondary | ICD-10-CM

## 2016-12-01 DIAGNOSIS — D61818 Other pancytopenia: Secondary | ICD-10-CM

## 2016-12-01 DIAGNOSIS — R05 Cough: Secondary | ICD-10-CM

## 2016-12-01 LAB — CBC WITH DIFFERENTIAL/PLATELET
Basophils Absolute: 0 10*3/uL (ref 0.0–0.1)
Basophils Relative: 0 %
EOS ABS: 0 10*3/uL (ref 0.0–0.7)
Eosinophils Relative: 1 %
HEMATOCRIT: 26.8 % — AB (ref 39.0–52.0)
HEMOGLOBIN: 8.6 g/dL — AB (ref 13.0–17.0)
LYMPHS ABS: 0.4 10*3/uL — AB (ref 0.7–4.0)
LYMPHS PCT: 12 %
MCH: 24.4 pg — AB (ref 26.0–34.0)
MCHC: 32.1 g/dL (ref 30.0–36.0)
MCV: 76.1 fL — AB (ref 78.0–100.0)
MONOS PCT: 2 %
Monocytes Absolute: 0.1 10*3/uL (ref 0.1–1.0)
NEUTROS ABS: 3 10*3/uL (ref 1.7–7.7)
NEUTROS PCT: 86 %
Platelets: 52 10*3/uL — ABNORMAL LOW (ref 150–400)
RBC: 3.52 MIL/uL — AB (ref 4.22–5.81)
RDW: 19.9 % — ABNORMAL HIGH (ref 11.5–15.5)
WBC: 3.5 10*3/uL — AB (ref 4.0–10.5)

## 2016-12-01 LAB — COMPREHENSIVE METABOLIC PANEL
ALK PHOS: 42 U/L (ref 38–126)
ALT: 16 U/L — AB (ref 17–63)
ANION GAP: 9 (ref 5–15)
AST: 13 U/L — ABNORMAL LOW (ref 15–41)
Albumin: 2.3 g/dL — ABNORMAL LOW (ref 3.5–5.0)
BILIRUBIN TOTAL: 0.6 mg/dL (ref 0.3–1.2)
BUN: 38 mg/dL — ABNORMAL HIGH (ref 6–20)
CALCIUM: 8.3 mg/dL — AB (ref 8.9–10.3)
CO2: 30 mmol/L (ref 22–32)
CREATININE: 1.04 mg/dL (ref 0.61–1.24)
Chloride: 103 mmol/L (ref 101–111)
GFR calc non Af Amer: >60 mL/min (ref >60)
Glucose, Bld: 94 mg/dL (ref 65–99)
Potassium: 4.1 mmol/L (ref 3.5–5.1)
SODIUM: 142 mmol/L (ref 135–145)
TOTAL PROTEIN: 5.6 g/dL — AB (ref 6.5–8.1)

## 2016-12-01 LAB — ERYTHROPOIETIN: Erythropoietin: 33.5 m[IU]/mL — ABNORMAL HIGH (ref 2.6–18.5)

## 2016-12-01 LAB — GLUCOSE, CAPILLARY: GLUCOSE-CAPILLARY: 97 mg/dL (ref 65–99)

## 2016-12-01 NOTE — Progress Notes (Signed)
Pt was given some trail mix by family member and now is coughing and very dyspenic.  Cough is productive of thick tan to brown secretions. Sats dropped to mid 80's with increased to 6L.  Pt requested another breathing treatment however too close to last treatment.  Pt was enc to relax and was repositioned, family member remains at bedside.  Pt was able to recover sats and calm down. Dr Lamonte Sakai present on unit and informed.  Family asked not to give pt anymore trail mix.

## 2016-12-01 NOTE — Care Management Note (Signed)
Case Management Note  Patient Details  Name: Antonios Ostrow, MD MRN: [id number] Date of Birth: [birth year]  Subjective/Objective:                  dyspnea  Action/Plan: Date:  December 01, 2016 Chart reviewed for concurrent status and case management needs.  Will continue to follow patient progress.  Discharge Planning: following for needs  Expected discharge date: December 04, 2016  Velva Harman, BSN, [place], [place]   Expected Discharge Date:  (unknown)               Expected Discharge Plan:  Home/Self Care  In-House Referral:     Discharge planning Services  CM Consult  Post Acute Care Choice:    Choice offered to:     DME Arranged:    DME Agency:     HH Arranged:    HH Agency:     Status of Service:  In process, will continue to follow  If discussed at Long Length of Stay Meetings, dates discussed:    Additional Comments:  Leeroy Cha, RN 12/01/2016, 9:53 AM

## 2016-12-01 NOTE — Progress Notes (Signed)
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    SUBJECTIVE:  RN reports pt sleeping this am.  Reportedly did not sleep last night.  No acute events.  Pending bone marrow biopsy.   VITAL SIGNS: BP (!) 115/34 (BP Location: Left Arm)   Pulse 74   Temp 98.9 F (37.2 C) (Oral)   Resp (!) 24   Ht 6' (1.829 m)   Wt 160 lb 4.4 oz (72.7 kg)   SpO2 97%   BMI 21.74 kg/m   HEMODYNAMICS:    VENTILATOR SETTINGS:    INTAKE / OUTPUT: I/O last 3 completed shifts: In: 543 [P.O.:300; I.V.:243] Out: 1501 [Urine:1500; Stool:1]  PHYSICAL EXAMINATION: General:  Elderly male in NAD lying in bed sleeping  HEENT: MM pink/dry Neuro: sleeping, no distress.  Awakens without difficulty, MAE, oriented.  CV: s1s2 rrr, no m/r/g PULM: even/non-labored, lungs bilaterally clear  LA:GTXM, non-tender, bsx4 active  Extremities: warm/dry, no edema  Skin: no rashes or lesions  LABS:  BMET Recent Labs  Lab 11/29/16 0349 11/30/16 0833 12/01/16 0411  NA 139 140 142  K 4.1 3.7 4.1  CL 104 102 103  CO2 '27 29 30  '$ BUN 31* 43* 38*  CREATININE 1.28* 1.34* 1.04  GLUCOSE 107* 94 94    Electrolytes Recent Labs  Lab 11/29/16 0349 11/30/16 0833 12/01/16 0411  CALCIUM 8.1* 8.5* 8.3*    CBC Recent Labs  Lab 11/29/16 0349 11/30/16 0833 12/01/16 0411  WBC 2.9* 3.2* 3.5*  HGB 8.1* 8.2* 8.6*  HCT 24.7* 25.7* 26.8*  PLT 50* 48* 52*    Coag's No results for input(s): APTT, INR in the last 168 hours.  Sepsis Markers No results for input(s): LATICACIDVEN, PROCALCITON, O2SATVEN in the last 168 hours.  ABG No results for input(s): PHART, PCO2ART, PO2ART in the last 168  hours.  Liver Enzymes Recent Labs  Lab 11/27/16 1700 11/30/16 0833 12/01/16 0411  AST 16 15 13*  ALT 15* 15* 16*  ALKPHOS 41 42 42  BILITOT 0.8 0.4 0.6  ALBUMIN 2.7* 2.2* 2.3*    Cardiac Enzymes Recent Labs  Lab 11/27/16 1700  TROPONINI 0.03*    Glucose Recent Labs  Lab 11/28/16 0809 11/29/16 0803 11/30/16 0747 12/01/16 0752  GLUCAP 99 102* 100* 97    Imaging Dg Chest 2 View  Result Date: 11/30/2016 CLINICAL DATA:  Acute on chronic respiratory failure with hypercapnia. Ex-smoker. EXAM: CHEST  2 VIEW COMPARISON:  Yesterday. FINDINGS: Normal sized heart. Tortuous aorta. Stable left subclavian pacemaker leads. Stable hyperexpansion of the lungs. Mild diffuse interstitial prominence in the right lung with mild progression. This includes Kerley lines in the lower lung zone. Clear left lung. Small amount of pleural fluid at the posterior lung bases on the lateral view. Mild bilateral shoulder degenerative changes. IMPRESSION: 1. Mildly increased interstitial lung disease on the right with some interstitial pulmonary edema and probable underlying interstitial pneumonitis. 2. Small right pleural effusion. 3. Stable changes of COPD. Electronically Signed   By: Claudie Revering M.D.   On: 11/30/2016 16:13     STUDIES:  11/1 CXR > severe emphysema, ? Vascular congestion  CULTURES: 11/1 blood >>  11/1 urine >>  negative 11/2 resp viral panel >> negative  ANTIBIOTICS: 11/1 vanc x1 11/1 cefepime x1   SIGNIFICANT EVENTS:   LINES/TUBES:   DISCUSSION: 83 y/o male with severe COPD admitted 11/1 with fever and weakness in the setting of multiple recent hospitalizations and unexplained pancytopenia.  Had vocal cord dysfunction and anxiety on 11/2.  Improved with PRN ativan.    ASSESSMENT / PLAN:  PULMONARY A: Acute on Chronic respiratory failure with hypoxemia Severe anxiety, vocal cord dysfunction Severe emphysema COPD exacerbation Likely viral bronchitis P:   Monitor  off abx Solumedrol ended 11/3 > no wheezing on exam 11/5 am. Will hold restart for now.  PRN BiPAP  Brovana + Pulmicort  Q4 Albuterol  O2 for saturations > 88% Pulmonary hygiene as able  Muxinex BID, flonase   CARDIOVASCULAR A:  History of nonischemic cardiomyopathy, 2017 LVEF 55% P:  Tele monitoring  Zocor QD  RENAL A:   CKD BPH, multiple in/out caths (home regimen) P:   Maintain foley  Trend BMP / urinary output Replace electrolytes as indicated Avoid nephrotoxic agents, ensure adequate renal perfusion  GASTROINTESTINAL A:   Dysphagia? Weight loss P:   Bowel regimen  Diet as tolerated  Aspiration precautions  HEMATOLOGIC A:   Pancytopenia P:  Hematology following, appreciate in put  Pending bone marrow biopsy   INFECTIOUS A:   Fever, no pneumonia P:   Monitor WBC trend / fever curve   ENDOCRINE A:   DM2 P:   SSI    NEUROLOGIC A:   Generalized weakness Anxiety P:   PT consult  OOB    CODE STATUS: DNR  FAMILY  - Updates: Son Clair Gulling updated 11/4 bedside.  No family available am 11/5 on NP rounding.      Noe Gens, NP-C [facility] Pgr: [phone] or if no answer [phone] 12/01/2016, 9:22 AM

## 2016-12-01 NOTE — Progress Notes (Addendum)
Physical Therapy Treatment Patient Details Name: [name] Walbert, MD MRN: [id number] DOB: [birth year] Today's Date: 12/01/2016    History of Present Illness Pt is a 83 y/o male admited with acute on chronic resp failure PMH includes DM, CKD, MI, COPD, CAD, CHF, and ischemic cardiomyopathy; this is his 3rd hospital admission in ~ 2 mos    PT Comments    Min A to pivot to recliner with RW, SaO2 95% on 6L O2, HR 80s with activity. Performed seated LE/UE exercises. Pt reports he primarily used WC at home, did "very little" walking, however per PT note from prior admission November 14, 2016, he ambulated 99' with RW. Will attempt ambulation next visit.   Follow Up Recommendations  Home health PT;Supervision/Assistance - 24 hour     Equipment Recommendations  None recommended by PT    Recommendations for Other Services       Precautions / Restrictions Precautions Precautions: Fall Precaution Comments: Home O2 @ 2 to 3L Restrictions Weight Bearing Restrictions: No    Mobility  Bed Mobility Overal bed mobility: Needs Assistance Bed Mobility: Supine to Sit     Supine to sit: Supervision;HOB elevated        Transfers Overall transfer level: Needs assistance   Transfers: Sit to/from Stand;Stand Pivot Transfers Sit to Stand: Min assist;+2 safety/equipment Stand pivot transfers: Min assist;+2 safety/equipment       General transfer comment: assist to rise and stabilize, cues for hand placement; SaO2 95% on 6L O2, HR 80s  Ambulation/Gait                 Stairs            Wheelchair Mobility    Modified Rankin (Stroke Patients Only)       Balance Overall balance assessment: Needs assistance Sitting-balance support: No upper extremity supported;Feet supported Sitting balance-Leahy Scale: Good     Standing balance support: Bilateral upper extremity supported;During functional activity Standing balance-Leahy Scale: Poor                               Cognition Arousal/Alertness: Awake/alert Behavior During Therapy: WFL for tasks assessed/performed Overall Cognitive Status: Within Functional Limits for tasks assessed                                 General Comments: [facility]      Exercises General Exercises - Lower Extremity Ankle Circles/Pumps: AROM;Both;15 reps;Seated Long Arc Quad: AROM;Both;10 reps;Seated Hip Flexion/Marching: AROM;Both;10 reps;Seated    General Comments        Pertinent Vitals/Pain Pain Assessment: No/denies pain    Home Living                      Prior Function            PT Goals (current goals can now be found in the care plan section) Acute Rehab PT Goals Patient Stated Goal: to feel better, breathe better PT Goal Formulation: With patient/family Time For Goal Achievement: 12/12/16 Potential to Achieve Goals: Fair Progress towards PT goals: Progressing toward goals    Frequency    Min 3X/week      PT Plan Current plan remains appropriate    Co-evaluation              AM-PAC PT "6 Clicks" Daily Activity  Outcome Measure  Difficulty turning over in  bed (including adjusting bedclothes, sheets and blankets)?: A Little Difficulty moving from lying on back to sitting on the side of the bed? : A Little Difficulty sitting down on and standing up from a chair with arms (e.g., wheelchair, bedside commode, etc,.)?: A Lot Help needed moving to and from a bed to chair (including a wheelchair)?: A Little Help needed walking in hospital room?: A Lot Help needed climbing 3-5 steps with a railing? : A Lot 6 Click Score: 15    End of Session Equipment Utilized During Treatment: Gait belt;Oxygen Activity Tolerance: Patient tolerated treatment well Patient left: in chair;with call bell/phone within reach;with chair alarm set Nurse Communication: Mobility status PT Visit Diagnosis: Unsteadiness on feet (R26.81);Muscle weakness (generalized) (M62.81)      Time: 9458-5929 PT Time Calculation (min) (ACUTE ONLY): 13 min  Charges:  $Therapeutic Activity: 8-22 mins                    G Codes:         Philomena Doheny 12/01/2016, 2:42 PM [phone]

## 2016-12-01 NOTE — Progress Notes (Signed)
PA to bedside to follow-up possible bone marrow biopsy this week.  Patient has been stable over the weekend although his respiratory status waxes and wanes.  Discussed with critical care team who feel patient is stable for procedure.  Physical exam unchanged from last assessment.  Discussed with patient and son at bedside.  Patient is alert and oriented. Risks and benefits discussed with the patient including, but not limited to bleeding, infection, damage to adjacent structures or low yield requiring additional tests. All of the patient's questions were answered, patient is agreeable to proceed. Consent signed and in chart.  Plan to proceed with biopsy on Wednesday.   Brynda Greathouse, MS RD PA-C 3:29 PM

## 2016-12-01 NOTE — Progress Notes (Signed)
Pt currently on 4 LPM [place] and tolerating well at this time.  BIPAP not needed, RT to monitor and assess as needed.

## 2016-12-02 ENCOUNTER — Other Ambulatory Visit: Payer: Medicare Other

## 2016-12-02 ENCOUNTER — Inpatient Hospital Stay (HOSPITAL_COMMUNITY): Payer: Medicare Other

## 2016-12-02 ENCOUNTER — Ambulatory Visit: Payer: Medicare Other

## 2016-12-02 LAB — GLUCOSE, CAPILLARY: Glucose-Capillary: 95 mg/dL (ref 65–99)

## 2016-12-02 LAB — MULTIPLE MYELOMA PANEL, SERUM
ALBUMIN SERPL ELPH-MCNC: 2.4 g/dL — AB (ref 2.9–4.4)
ALBUMIN/GLOB SERPL: 0.9 (ref 0.7–1.7)
ALPHA 1: 0.4 g/dL (ref 0.0–0.4)
ALPHA2 GLOB SERPL ELPH-MCNC: 0.8 g/dL (ref 0.4–1.0)
B-GLOBULIN SERPL ELPH-MCNC: 1.3 g/dL (ref 0.7–1.3)
GAMMA GLOB SERPL ELPH-MCNC: 0.2 g/dL — AB (ref 0.4–1.8)
GLOBULIN, TOTAL: 2.7 g/dL (ref 2.2–3.9)
IgA: 797 mg/dL — ABNORMAL HIGH (ref 61–437)
IgG (Immunoglobin G), Serum: 253 mg/dL — ABNORMAL LOW (ref 700–1600)
IgM (Immunoglobulin M), Srm: 9 mg/dL — ABNORMAL LOW (ref 15–143)
M PROTEIN SERPL ELPH-MCNC: 0.7 g/dL — AB
Total Protein ELP: 5.1 g/dL — ABNORMAL LOW (ref 6.0–8.5)

## 2016-12-02 LAB — COMPREHENSIVE METABOLIC PANEL
ALT: 15 U/L — ABNORMAL LOW (ref 17–63)
ANION GAP: 11 (ref 5–15)
AST: 12 U/L — ABNORMAL LOW (ref 15–41)
Albumin: 2.2 g/dL — ABNORMAL LOW (ref 3.5–5.0)
Alkaline Phosphatase: 39 U/L (ref 38–126)
BUN: 35 mg/dL — ABNORMAL HIGH (ref 6–20)
CHLORIDE: 101 mmol/L (ref 101–111)
CO2: 28 mmol/L (ref 22–32)
Calcium: 8.2 mg/dL — ABNORMAL LOW (ref 8.9–10.3)
Creatinine, Ser: 1.03 mg/dL (ref 0.61–1.24)
GFR calc non Af Amer: >60 mL/min (ref >60)
Glucose, Bld: 84 mg/dL (ref 65–99)
Potassium: 4.3 mmol/L (ref 3.5–5.1)
Sodium: 140 mmol/L (ref 135–145)
Total Bilirubin: 0.7 mg/dL (ref 0.3–1.2)
Total Protein: 5.5 g/dL — ABNORMAL LOW (ref 6.5–8.1)

## 2016-12-02 LAB — CBC WITH DIFFERENTIAL/PLATELET
Basophils Absolute: 0 10*3/uL (ref 0.0–0.1)
Basophils Relative: 1 %
EOS ABS: 0 10*3/uL (ref 0.0–0.7)
EOS PCT: 1 %
HCT: 26.1 % — ABNORMAL LOW (ref 39.0–52.0)
Hemoglobin: 8.3 g/dL — ABNORMAL LOW (ref 13.0–17.0)
LYMPHS ABS: 0.5 10*3/uL — AB (ref 0.7–4.0)
Lymphocytes Relative: 18 %
MCH: 24.3 pg — AB (ref 26.0–34.0)
MCHC: 31.8 g/dL (ref 30.0–36.0)
MCV: 76.3 fL — ABNORMAL LOW (ref 78.0–100.0)
Monocytes Absolute: 0.1 10*3/uL (ref 0.1–1.0)
Monocytes Relative: 4 %
Neutro Abs: 2.2 10*3/uL (ref 1.7–7.7)
Neutrophils Relative %: 76 %
PLATELETS: 64 10*3/uL — AB (ref 150–400)
RBC: 3.42 MIL/uL — AB (ref 4.22–5.81)
RDW: 19.9 % — ABNORMAL HIGH (ref 11.5–15.5)
WBC: 2.9 10*3/uL — AB (ref 4.0–10.5)

## 2016-12-02 LAB — CULTURE, BLOOD (ROUTINE X 2)
CULTURE: NO GROWTH
CULTURE: NO GROWTH
SPECIAL REQUESTS: ADEQUATE
Special Requests: ADEQUATE

## 2016-12-02 MED ORDER — MAGNESIUM HYDROXIDE 400 MG/5ML PO SUSP
15.0000 mL | Freq: Every day | ORAL | Status: DC | PRN
  Administered 2016-12-08: 15 mL via ORAL
  Filled 2016-12-02: qty 30

## 2016-12-02 NOTE — Plan of Care (Signed)
  Progressing Safety: Ability to remain free from injury will improve 12/02/2016 1357 - Progressing by Staci Righter, RN Pain Managment: General experience of comfort will improve 12/02/2016 1357 - Progressing by Staci Righter, RN Physical Regulation: Ability to maintain clinical measurements within normal limits will improve 12/02/2016 1357 - Progressing by Staci Righter, RN Will remain free from infection 12/02/2016 1357 - Progressing by Staci Righter, RN Skin Integrity: Risk for impaired skin integrity will decrease 12/02/2016 1357 - Progressing by Staci Righter, RN Tissue Perfusion: Risk factors for ineffective tissue perfusion will decrease 12/02/2016 1357 - Progressing by Staci Righter, RN Activity: Risk for activity intolerance will decrease 12/02/2016 1357 - Progressing by Staci Righter, RN Fluid Volume: Ability to maintain a balanced intake and output will improve 12/02/2016 1357 - Progressing by Staci Righter, RN

## 2016-12-02 NOTE — Progress Notes (Signed)
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    SUBJECTIVE:   No significant clinical change last 24 hours Patient remains somewhat confused, does not remember our conversation from yesterday He is up to chair, looks a bit stronger  VITAL SIGNS: BP (!) 117/42   Pulse 67   Temp 98.2 F (36.8 C) (Oral)   Resp (!) 21   Ht 6' (1.829 m)   Wt 71.1 kg (156 lb 12 oz)   SpO2 99%   BMI 21.26 kg/m   HEMODYNAMICS:    VENTILATOR SETTINGS:    INTAKE / OUTPUT: I/O last 3 completed shifts: In: 250 [P.O.:250] Out: 2210 [Urine:2210]  PHYSICAL EXAMINATION: General: No distress, up to chair HEENT: Oropharynx clear, moist, no stridor Neuro: Awake, alert, poorly oriented, confused, moves all extremities CV: Regular, no murmur PULM: Distant but no crackles, no wheezes GI: Soft, benign Extremities: No edema Skin: No rash  LABS:  BMET Recent Labs  Lab 11/30/16 0833 12/01/16 0411 12/02/16 0350  NA 140 142 140  K 3.7 4.1 4.3  CL 102 103 101  CO2 _0 BUN 43* 38* 35*  CREATININE 1.34* 1.04 1.03  GLUCOSE 94 94 84    Electrolytes Recent Labs  Lab 11/30/16 0833 12/01/16 0411 12/02/16 0350  CALCIUM 8.5* 8.3* 8.2*    CBC Recent Labs  Lab 11/30/16 0833 12/01/16 0411 12/02/16 0350  WBC 3.2* 3.5* 2.9*  HGB 8.2* 8.6* 8.3*  HCT 25.7* 26.8* 26.1*  PLT 48* 52* 64*    Coag's No results for input(s): APTT, INR in the last 168 hours.  Sepsis Markers No results for input(s): LATICACIDVEN, PROCALCITON, O2SATVEN in the last 168 hours.  ABG No results for input(s): PHART, PCO2ART, PO2ART in the last 168 hours.  Liver Enzymes Recent Labs  Lab  11/30/16 0833 12/01/16 0411 12/02/16 0350  AST 15 13* 12*  ALT 15* 16* 15*  ALKPHOS 42 42 39  BILITOT 0.4 0.6 0.7  ALBUMIN 2.2* 2.3* 2.2*    Cardiac Enzymes Recent Labs  Lab 11/27/16 1700  TROPONINI 0.03*    Glucose Recent Labs  Lab 11/28/16 0809 11/29/16 0803 11/30/16 0747 12/01/16 0752 12/02/16 0740  GLUCAP 99 102* 100* 97 95    Imaging Dg Chest Port 1 View  Result Date: 12/02/2016 CLINICAL DATA:  Acute respiratory failure with hypoxia. EXAM: PORTABLE CHEST 1 VIEW COMPARISON:  Radiographs of November 30, 2016. FINDINGS: Stable cardiomediastinal silhouette. Atherosclerosis of thoracic aorta is noted. Left-sided pacemaker is unchanged in position. Left lung is clear. Stable interstitial prominence is noted in the right lung which may represent scarring, although superimposed acute edema or inflammation cannot be excluded. Bony thorax is unremarkable. IMPRESSION: Aortic atherosclerosis. Stable interstitial prominence is noted in the right lung which may represent scarring, although superimposed acute edema or inflammation cannot be excluded. Electronically Signed   By: Marijo Conception, M.D.   On: 12/02/2016 10:16     STUDIES:  11/1 CXR > severe emphysema, ? Vascular congestion  CULTURES: 11/1 blood >>  11/1 urine >> negative 11/2 resp viral panel >> negative  ANTIBIOTICS: 11/1 vanc x1 11/1 cefepime x1   SIGNIFICANT EVENTS:   LINES/TUBES:  DISCUSSION: 83 y/o male with severe COPD admitted 11/1 with fever and weakness in the setting of multiple recent hospitalizations and unexplained pancytopenia.  Had vocal cord dysfunction and anxiety on 11/2.  Improved with PRN ativan.    ASSESSMENT / PLAN:  PULMONARY A: Acute on Chronic respiratory failure with hypoxemia Severe anxiety, vocal cord dysfunction Severe emphysema COPD exacerbation Likely viral bronchitis P:   Following off antibiotics Has not required BiPAP Continue Brovana, Pulmicort, albuterol as  needed Wean oxygen for saturations greater than 88% Continue Mucinex, continue fluticasone nasal spray  CARDIOVASCULAR A:  History of nonischemic cardiomyopathy, 2017 LVEF 55% P:  Zocor as ordered  RENAL A:   CKD BPH, multiple in/out caths (home regimen) P:   Follow BMP, urine output  GASTROINTESTINAL A:   Dysphagia? Weight loss P:   Aspiration precautions Diet as tolerated Bowel regimen  HEMATOLOGIC A:   Pancytopenia P:  Appreciate hematology input Bone marrow biopsy planned for 11/7  INFECTIOUS A:   Fever, no pneumonia P:   Follow WBC, fever curve  ENDOCRINE A:   DM2 P:   Siding scale insulin   NEUROLOGIC A:   Generalized weakness Anxiety P:   Out of bed as tolerated PT daily   CODE STATUS: DNR  FAMILY  - Updates: Son Clair Gulling updated 11/4 bedside.  No family available am 11/5 on NP rounding.    We will ask TRH to assume his care as of 11/7.    Baltazar Apo, MD, PhD 12/02/2016, 4:31 PM [facility] [phone] or if no answer [phone]

## 2016-12-03 ENCOUNTER — Inpatient Hospital Stay (HOSPITAL_COMMUNITY): Payer: Medicare Other

## 2016-12-03 ENCOUNTER — Encounter (HOSPITAL_COMMUNITY): Payer: Self-pay | Admitting: Radiology

## 2016-12-03 DIAGNOSIS — I5032 Chronic diastolic (congestive) heart failure: Secondary | ICD-10-CM

## 2016-12-03 DIAGNOSIS — D561 Beta thalassemia: Secondary | ICD-10-CM

## 2016-12-03 LAB — CBC WITH DIFFERENTIAL/PLATELET
BASOS PCT: 0 %
Basophils Absolute: 0 10*3/uL (ref 0.0–0.1)
Eosinophils Absolute: 0 10*3/uL (ref 0.0–0.7)
Eosinophils Relative: 2 %
HEMATOCRIT: 25.1 % — AB (ref 39.0–52.0)
HEMOGLOBIN: 8 g/dL — AB (ref 13.0–17.0)
LYMPHS PCT: 20 %
Lymphs Abs: 0.6 10*3/uL — ABNORMAL LOW (ref 0.7–4.0)
MCH: 24.5 pg — ABNORMAL LOW (ref 26.0–34.0)
MCHC: 31.9 g/dL (ref 30.0–36.0)
MCV: 76.8 fL — AB (ref 78.0–100.0)
MONOS PCT: 3 %
Monocytes Absolute: 0.1 10*3/uL (ref 0.1–1.0)
NEUTROS ABS: 2 10*3/uL (ref 1.7–7.7)
NEUTROS PCT: 75 %
Platelets: 65 10*3/uL — ABNORMAL LOW (ref 150–400)
RBC: 3.27 MIL/uL — ABNORMAL LOW (ref 4.22–5.81)
RDW: 20.1 % — ABNORMAL HIGH (ref 11.5–15.5)
WBC: 2.7 10*3/uL — ABNORMAL LOW (ref 4.0–10.5)

## 2016-12-03 LAB — COMPREHENSIVE METABOLIC PANEL
ALBUMIN: 2.2 g/dL — AB (ref 3.5–5.0)
ALK PHOS: 39 U/L (ref 38–126)
ALT: 12 U/L — ABNORMAL LOW (ref 17–63)
ANION GAP: 9 (ref 5–15)
AST: 17 U/L (ref 15–41)
BILIRUBIN TOTAL: 0.9 mg/dL (ref 0.3–1.2)
BUN: 33 mg/dL — AB (ref 6–20)
CALCIUM: 8.2 mg/dL — AB (ref 8.9–10.3)
CO2: 28 mmol/L (ref 22–32)
Chloride: 102 mmol/L (ref 101–111)
Creatinine, Ser: 0.99 mg/dL (ref 0.61–1.24)
GFR calc Af Amer: >60 mL/min (ref >60)
GLUCOSE: 107 mg/dL — AB (ref 65–99)
Potassium: 4.6 mmol/L (ref 3.5–5.1)
Sodium: 139 mmol/L (ref 135–145)
Total Protein: 5.4 g/dL — ABNORMAL LOW (ref 6.5–8.1)

## 2016-12-03 LAB — GLUCOSE, CAPILLARY: Glucose-Capillary: 90 mg/dL (ref 65–99)

## 2016-12-03 MED ORDER — MIDAZOLAM HCL 2 MG/2ML IJ SOLN
INTRAMUSCULAR | Status: AC | PRN
  Administered 2016-12-03 (×2): 0.5 mg via INTRAVENOUS

## 2016-12-03 MED ORDER — FENTANYL CITRATE (PF) 100 MCG/2ML IJ SOLN
INTRAMUSCULAR | Status: AC | PRN
  Administered 2016-12-03 (×2): 25 ug via INTRAVENOUS

## 2016-12-03 MED ORDER — FENTANYL CITRATE (PF) 100 MCG/2ML IJ SOLN
INTRAMUSCULAR | Status: AC
  Filled 2016-12-03: qty 2

## 2016-12-03 MED ORDER — MIDAZOLAM HCL 2 MG/2ML IJ SOLN
INTRAMUSCULAR | Status: AC
  Filled 2016-12-03: qty 2

## 2016-12-03 MED ORDER — SODIUM CHLORIDE 0.9 % IV SOLN
INTRAVENOUS | Status: AC
  Filled 2016-12-03: qty 250

## 2016-12-03 NOTE — Progress Notes (Signed)
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  Patient s/p CT guided aspirate and core biopsy of right posterior iliac bone on 12/03/16    PT Comments    Pt agreeable to up to recliner.  Pt had biopsy this morning and did not feel up to ambulating today.  Pt with increased work of breathing and higher respiratory rate with activity, cued for breathing and properly placed nasal cannula (wiping nose with tissue moved off nare).      Follow Up Recommendations  Home health PT;Supervision/Assistance - 24 hour     Equipment Recommendations  None recommended by PT    Recommendations for Other Services       Precautions / Restrictions Precautions Precautions: Fall Precaution Comments: Home O2 @ 2 to 3L    Mobility  Bed Mobility Overal bed mobility: Needs Assistance Bed Mobility: Supine to Sit     Supine to sit: Supervision;HOB elevated     General bed mobility comments: supervision for safety and lines  Transfers Overall transfer level: Needs assistance Equipment used: 2 person hand held assist Transfers: Sit to/from Stand;Stand Pivot Transfers Sit to Stand: Min assist;+2 safety/equipment Stand pivot transfers: Min assist;+2 safety/equipment       General transfer comment: assist to rise and stabilize, pt held out hands for bil UE assist; SpO2 briefly down to 88% on 2.5 L during transfer (95% on 2.5L at rest), pt with increased work of breathing and high RR with mobility  (RR 27-35-26)  Ambulation/Gait                 Stairs            Wheelchair Mobility    Modified Rankin (Stroke Patients Only)       Balance                                             Cognition Arousal/Alertness: Awake/alert Behavior During Therapy: WFL for tasks assessed/performed Overall Cognitive Status: Within Functional Limits for tasks assessed                                 General Comments: HOH      Exercises      General Comments        Pertinent Vitals/Pain Pain Assessment: Faces Faces Pain Scale: Hurts little more Pain Location: "bottom"  Pain Descriptors / Indicators: Sore Pain Intervention(s): Patient requesting pain meds-RN notified(RN in room and aware)    Home Living                      Prior Function            PT Goals (current goals can now be found in the care plan section) Progress towards PT goals: Progressing toward goals    Frequency    Min 3X/week      PT Plan Current plan remains appropriate    Co-evaluation              AM-PAC PT "6 Clicks"  Daily Activity  Outcome Measure  Difficulty turning over in bed (including adjusting bedclothes, sheets and blankets)?: A Little Difficulty moving from lying on back to sitting on the side of the bed? : A Little Difficulty sitting down on and standing up from a chair with arms (e.g., wheelchair, bedside commode, etc,.)?: Unable Help needed moving to and from a bed to chair (including a wheelchair)?: A Little Help needed walking in hospital room?: A Lot Help needed climbing 3-5 steps with a railing? : A Lot 6 Click Score: 14    End of Session Equipment Utilized During Treatment: Oxygen Activity Tolerance: Patient limited by fatigue Patient left: in chair;with call bell/phone within reach;with chair alarm set;with nursing/sitter in room Nurse Communication: Mobility status PT Visit Diagnosis: Unsteadiness on feet (R26.81);Muscle weakness (generalized) (M62.81)     Time: 1021-1173 PT Time Calculation (min) (ACUTE ONLY): 15 min  Charges:  $Therapeutic Activity: 8-22 mins                    G Codes:       Carmelia Bake, PT,  DPT 12/03/2016 Pager: [phone]  York Ram E 12/03/2016, 3:05 PM

## 2016-12-03 NOTE — Progress Notes (Signed)
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  A chest x-ray showed evidence of pneumonitis.  He was transfused with packed red blood cells 11/28/2016 after the hemoglobin returned at 6.9.  He is scheduled for a bone marrow biopsy today.  Objective: Vital signs in last 24 hours: Blood pressure 127/72, pulse 83, temperature 97.9 F (36.6 C), temperature source Oral, resp. rate (!) 31, height 6' (1.829 m), weight 156 lb 1.4 oz (70.8 kg), SpO2 95 %.  Intake/Output from previous day: 11/06 0701 - 11/07 0700 In: 360 [P.O.:360] Out: 1200 [Urine:1200]  Physical Exam:  HEENT: No thrush Lungs: Distant breath sounds Cardiac: Distant heart sounds Abdomen: No hepatosplenomegaly Extremities: No leg edema   Portacath/PICC-without erythema  Lab Results: Recent Labs    12/02/16 0350 12/03/16 0338  WBC 2.9* 2.7*  HGB 8.3* 8.0*  HCT 26.1* 25.1*  PLT 64* 65*    BMET Recent Labs    12/02/16 0350 12/03/16 0338  NA 140 139  K 4.3 4.6  CL 101 102  CO2 28 28  GLUCOSE 84 107*  BUN 35* 33*  CREATININE 1.03 0.99  CALCIUM 8.2* 8.2*    No results found for: CEA1  Studies/Results: Ct Biopsy  Result Date: 12/03/2016 INDICATION: 83 year old male with a history of anemia EXAM: CT BIOPSY; CT BONE MARROW BIOPSY AND ASPIRATION MEDICATIONS: None. ANESTHESIA/SEDATION: Moderate (conscious) sedation was employed during this procedure. A total of Versed 1.0 mg and Fentanyl 50 mcg was administered intravenously. Moderate Sedation Time: 10 minutes. The patient's level of consciousness and vital signs were monitored continuously by radiology nursing throughout the procedure under my direct supervision. FLUOROSCOPY TIME:  CT COMPLICATIONS: None PROCEDURE: The procedure risks, benefits, and alternatives were explained to the patient. Questions regarding the procedure were encouraged and answered. The patient understands and consents to the procedure.  Scout CT of the pelvis was performed for surgical planning purposes. The posterior pelvis was prepped with Betadinein a sterile fashion, and a sterile drape was applied covering the operative field. A sterile gown and sterile gloves were used for the procedure. Local anesthesia was provided with 1% Lidocaine. We targeted the right posterior iliac bone for biopsy. The skin and subcutaneous tissues were infiltrated with 1% lidocaine without epinephrine. A small stab incision was made with an 11 blade scalpel, and an 11 gauge Murphy needle was advanced with CT guidance to the posterior cortex. Manual forced was used to advance the needle through the posterior cortex and the stylet was removed. A bone marrow aspirate was retrieved and passed to a cytotechnologist in the room. The Murphy needle was then advanced without the stylet for a core biopsy. The core biopsy was retrieved and also passed to a cytotechnologist. Manual pressure was used for hemostasis and a sterile dressing was placed. No complications were encountered no significant blood loss was encountered. Patient tolerated the procedure well and remained hemodynamically stable throughout. IMPRESSION: Status post CT-guided bone marrow biopsy, with tissue specimen sent to pathology for complete histopathologic analysis Signed, Dulcy Fanny. Earleen Newport, DO [facility] Radiology Electronically Signed   By: Corrie Mckusick D.O.   On: 12/03/2016 12:18   Dg Chest Port 1 View  Result Date: 12/02/2016 CLINICAL DATA:  Acute respiratory failure with hypoxia. EXAM: PORTABLE CHEST 1 VIEW COMPARISON:  Radiographs of November 30, 2016. FINDINGS: Stable cardiomediastinal silhouette. Atherosclerosis of thoracic aorta is noted. Left-sided pacemaker is unchanged in position. Left lung is clear.  Stable interstitial prominence is noted in the right lung which may represent scarring, although superimposed acute edema or inflammation cannot  be excluded. Bony thorax is unremarkable. IMPRESSION: Aortic atherosclerosis. Stable interstitial prominence is noted in the right lung which may represent scarring, although superimposed acute edema or inflammation cannot be excluded. Electronically Signed   By: Marijo Conception, M.D.   On: 12/02/2016 10:16   Ct Bone Marrow Biopsy & Aspiration  Result Date: 12/03/2016 INDICATION: 83 year old male with a history of anemia EXAM: CT BIOPSY; CT BONE MARROW BIOPSY AND ASPIRATION MEDICATIONS: None. ANESTHESIA/SEDATION: Moderate (conscious) sedation was employed during this procedure. A total of Versed 1.0 mg and Fentanyl 50 mcg was administered intravenously. Moderate Sedation Time: 10 minutes. The patient's level of consciousness and vital signs were monitored continuously by radiology nursing throughout the procedure under my direct supervision. FLUOROSCOPY TIME:  CT COMPLICATIONS: None PROCEDURE: The procedure risks, benefits, and alternatives were explained to the patient. Questions regarding the procedure were encouraged and answered. The patient understands and consents to the procedure. Scout CT of the pelvis was performed for surgical planning purposes. The posterior pelvis was prepped with Betadinein a sterile fashion, and a sterile drape was applied covering the operative field. A sterile gown and sterile gloves were used for the procedure. Local anesthesia was provided with 1% Lidocaine. We targeted the right posterior iliac bone for biopsy. The skin and subcutaneous tissues were infiltrated with 1% lidocaine without epinephrine. A small stab incision was made with an 11 blade scalpel, and an 11 gauge Murphy needle was advanced with CT guidance to the posterior cortex. Manual forced was used to advance the needle through the posterior cortex and the stylet was removed. A bone marrow aspirate was retrieved and passed to a cytotechnologist in the room. The Murphy needle was then advanced without the stylet  for a core biopsy. The core biopsy was retrieved and also passed to a cytotechnologist. Manual pressure was used for hemostasis and a sterile dressing was placed. No complications were encountered no significant blood loss was encountered. Patient tolerated the procedure well and remained hemodynamically stable throughout. IMPRESSION: Status post CT-guided bone marrow biopsy, with tissue specimen sent to pathology for complete histopathologic analysis Signed, Dulcy Fanny. Earleen Newport, DO [facility] Radiology Electronically Signed   By: Corrie Mckusick D.O.   On: 12/03/2016 12:18    Medications: I have reviewed the patient's current medications.  Assessment/Plan: 1. Pancytopenia-transfuse with packed red blood cells 11/28/2016 2. History of beta thalassemia trait 3. History of vitamin B-12 deficiency-vitamin B-12 replacement initiated 11/11/2016 4. History of a serum monoclonal IgA kappa protein-elevated serum free kappa light chains , IgA, and M spike 11/11/2016 5. COPD with acute and chronic respiratory failure/hypoxemia 6. History of coronary artery disease 7. Ischemic cardiomyopathy 8. ICD in place 9. 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  He has transfusion dependent anemia in addition to thrombocytopenia.  The anemia is likely secondary to thalassemia trat, chronic disease, and I am concerned he has underlying myelodysplasia or a lymphoproliferative disorder.  He has a serum IgA kappa monoclonal protein.  Recommendations:  1.  Management of respiratory failure per pulmonary medicine 2.  Proceed with diagnostic bone marrow biopsy 3.  I will follow-up on the bone marrow biopsy result and recommend treatment as indicated. 4.  Follow-up in the hematology clinic as scheduled next week.     LOS: 6 days   [name], MD  12/03/2016, 3:04 PM

## 2016-12-03 NOTE — Progress Notes (Signed)
Patient transferred to CT scan for biopsy.

## 2016-12-03 NOTE — Procedures (Signed)
Interventional Radiology Procedure Note  Procedure: CT guided aspirate and core biopsy of right posterior iliac bone Complications: None Recommendations: - Bedrest supine x 1 hrs - OTC's PRN  Pain - Follow biopsy results  Signed,  Launi Asencio S. Taner Rzepka, DO    

## 2016-12-03 NOTE — Progress Notes (Signed)
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 Sex: male Date of Birth: 02-Nov-1925 Admit Date: 11/27/2016 Admitting Physician Javier Glazier, MD NOT:RRNHAF, Denton Ar, MD  Brief Narrative: Patient is a 83 y.o. Male with past medical history of severe COPD, on home O2, chronic iron deficiency anemia, recent hospitalizations for COPD exacerbation/pneumonia, admitted for fever and weakness. The patient was admitted by pulmonary critical care to the ICU, he was given steroids, bronchodilators. He was found to have pancytopenia. Plans are to pursue any bone marrow biopsy today. See below for further details.  Subjective: Feels better than the past few days-lying comfortably in bed. Not in any distress.   Assessment/Plan: Acute on chronic hypoxic respiratory failure: Multifactorial etiology-has severe COPD at baseline-felt to have probable COPD exacerbation, anxiety, probably viral bronchitis or playing a role. Anemia could have worsened her shortness of breath as well. Continue bronchodilators, continue to decrease/titrate down FiO2 as tolerated. Has completed a course of steroids. Okay to transfer to floor after biopsy.  COPD exacerbation: Improving-off steroids-continue bronchodilators. Apparently has advanced COPD at baseline.  Pancytopenia: suspicion for underlying myelodysplastic disorder-hematology following-plans are for a CT-guided biopsy today.  History of chronic diastolic heart failure: Currently compensated. Continue metoprolol.  History of CAD: Currently without chest pain, continue  statin and metoprolol. Resume Plavix post biopsy.   History of prior cardiac arrest: Apparently in 2011-patient had a V. 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  Dyslipidemia: Continue statin  Anxiety: Currently appears to be stable, continue as needed Xanax.  GERD: Continue PPI  DVT Prophylaxis: SCD's  Code  Status: DNR  Family Communication: None at bedside  Disposition Plan: Remain inpatient-okay to transfer to floor after biopsy.  Antimicrobial agents: Anti-infectives (From admission, onward)   Start     Dose/Rate Route Frequency Ordered Stop   11/28/16 2000  vancomycin (VANCOCIN) IVPB 1000 mg/200 mL premix  Status:  Discontinued     1,000 mg 200 mL/hr over 60 Minutes Intravenous Every 24 hours 11/27/16 1741 11/27/16 2016   11/28/16 2000  vancomycin (VANCOCIN) IVPB 750 mg/150 ml premix  Status:  Discontinued     750 mg 150 mL/hr over 60 Minutes Intravenous Every 24 hours 11/27/16 2016 11/28/16 1049   11/27/16 1830  vancomycin (VANCOCIN) 1,500 mg in sodium chloride 0.9 % 500 mL IVPB     1,500 mg 250 mL/hr over 120 Minutes Intravenous  Once 11/27/16 1741 11/27/16 2129   11/27/16 1800  ceFEPIme (MAXIPIME) 2 g in dextrose 5 % 50 mL IVPB  Status:  Discontinued     2 g 100 mL/hr over 30 Minutes Intravenous Every 24 hours 11/27/16 1712 11/28/16 1131      Procedures: None  CONSULTS:  pulmonary/intensive care and hematology/oncology  Time spent: 25 minutes-Greater than 50% of this time was spent in counseling, explanation of diagnosis, planning of further management, and coordination of care.  MEDICATIONS: Scheduled Meds: . albuterol  2.5 mg Nebulization Q4H  . arformoterol  15 mcg Nebulization BID  . budesonide  0.5 mg Nebulization BID  . cyanocobalamin  1,000 mcg Intramuscular QPC breakfast  . fentaNYL      . fluticasone  1 spray Each Nare Daily  . guaiFENesin  600 mg Oral BID  . metoprolol tartrate  12.5 mg Oral BID  . midazolam      .  pantoprazole  40 mg Oral Daily  . rosuvastatin  5 mg Oral Daily  . sodium chloride flush  3 mL Intravenous Q12H  . ascorbic acid  250 mg Oral BID   Continuous Infusions: . sodium chloride    . sodium chloride 10 mL/hr at 11/30/16 0600  . sodium chloride     PRN Meds:.sodium chloride, acetaminophen **OR** acetaminophen, albuterol,  ALPRAZolam, docusate sodium, fentaNYL, magnesium hydroxide, midazolam, ondansetron **OR** ondansetron (ZOFRAN) IV, senna-docusate, sodium chloride flush   PHYSICAL EXAM: Vital signs: Vitals:   12/03/16 1000 12/03/16 1056 12/03/16 1110 12/03/16 1115  BP: (!) 104/38 (!) 104/38 (!) 129/105 (!) 127/42  Pulse:  72 80 83  Resp: 19 (!) 21 (!) 25 (!) 25  Temp:      TempSrc:      SpO2: 97% 97% 96% 96%  Weight:      Height:       Filed Weights   11/30/16 0356 12/02/16 0455 12/03/16 0350  Weight: 72.7 kg (160 lb 4.4 oz) 71.1 kg (156 lb 12 oz) 70.8 kg (156 lb 1.4 oz)   Body mass index is 21.17 kg/m.   General appearance :Awake, alert, not in any distress. Chronically sick appearing Eyes:, pupils equally reactive to light and accomodation,no scleral icterus. HEENT: Atraumatic and Normocephalic Neck: supple, no JVD. No cervical lymphadenopathy. Resp:Good air entry bilaterally, no added sounds  CVS: S1 S2 regular GI: Bowel sounds present, Non tender and not distended with no gaurding, rigidity or rebound.No organomegaly Extremities: B/L Lower Ext shows no edema, both legs are warm to touch Neurology:  speech clear,Non focal, sensation is grossly intact. Psychiatric: Normal judgment and insight. Alert and oriented x 3. Normal mood. Musculoskeletal:No digital cyanosis Skin:No Rash, warm and dry Wounds:N/A  I have personally reviewed following labs and imaging studies  LABORATORY DATA: CBC: Recent Labs  Lab 11/27/16 1700  11/29/16 0349 11/30/16 0833 12/01/16 0411 12/02/16 0350 12/03/16 0338  WBC 4.2   < > 2.9* 3.2* 3.5* 2.9* 2.7*  NEUTROABS 3.6  --   --  2.5 3.0 2.2 2.0  HGB 6.7*   < > 8.1* 8.2* 8.6* 8.3* 8.0*  HCT 20.5*   < > 24.7* 25.7* 26.8* 26.1* 25.1*  MCV 71.4*   < > 75.8* 75.6* 76.1* 76.3* 76.8*  PLT 58*   < > 50* 48* 52* 64* 65*   < > = values in this interval not displayed.    Basic Metabolic Panel: Recent Labs  Lab 11/29/16 0349 11/30/16 0833 12/01/16 0411  12/02/16 0350 12/03/16 0338  NA 139 140 142 140 139  K 4.1 3.7 4.1 4.3 4.6  CL 104 102 103 101 102  CO2 _0 GLUCOSE 107* 94 94 84 107*  BUN 31* 43* 38* 35* 33*  CREATININE 1.28* 1.34* 1.04 1.03 0.99  CALCIUM 8.1* 8.5* 8.3* 8.2* 8.2*    GFR: Estimated Creatinine Clearance: 50.7 mL/min (by C-G formula based on SCr of 0.99 mg/dL).  Liver Function Tests: Recent Labs  Lab 11/27/16 1700 11/30/16 0833 12/01/16 0411 12/02/16 0350 12/03/16 0338  AST 16 15 13* 12* 17  ALT 15* 15* 16* 15* 12*  ALKPHOS 41 42 42 39 39  BILITOT 0.8 0.4 0.6 0.7 0.9  PROT 6.1* 5.6* 5.6* 5.5* 5.4*  ALBUMIN 2.7* 2.2* 2.3* 2.2* 2.2*   No results for input(s): LIPASE, AMYLASE in the last 168 hours. No results for input(s): AMMONIA in the last 168 hours.  Coagulation Profile: No results for input(s):  INR, PROTIME in the last 168 hours.  Cardiac Enzymes: Recent Labs  Lab 11/27/16 1700  TROPONINI 0.03*    BNP (last 3 results) No results for input(s): PROBNP in the last 8760 hours.  HbA1C: No results for input(s): HGBA1C in the last 72 hours.  CBG: Recent Labs  Lab 11/29/16 0803 11/30/16 0747 12/01/16 0752 12/02/16 0740 12/03/16 0738  GLUCAP 102* 100* 97 95 90    Lipid Profile: No results for input(s): CHOL, HDL, LDLCALC, TRIG, CHOLHDL, LDLDIRECT in the last 72 hours.  Thyroid Function Tests: No results for input(s): TSH, T4TOTAL, FREET4, T3FREE, THYROIDAB in the last 72 hours.  Anemia Panel: No results for input(s): VITAMINB12, FOLATE, FERRITIN, TIBC, IRON, RETICCTPCT in the last 72 hours.  Urine analysis:    Component Value Date/Time   COLORURINE YELLOW 11/27/2016 2121   APPEARANCEUR CLOUDY (A) 11/27/2016 2121   LABSPEC 1.018 11/27/2016 2121   LABSPEC 1.025 05/01/2010 1454   PHURINE 5.0 11/27/2016 2121   GLUCOSEU NEGATIVE 11/27/2016 2121   HGBUR NEGATIVE 11/27/2016 2121   BILIRUBINUR NEGATIVE 11/27/2016 2121   BILIRUBINUR Negative 05/01/2010 1454   KETONESUR 5  (A) 11/27/2016 2121   PROTEINUR 30 (A) 11/27/2016 2121   UROBILINOGEN 0.2 06/07/2014 2018   NITRITE NEGATIVE 11/27/2016 2121   LEUKOCYTESUR NEGATIVE 11/27/2016 2121   LEUKOCYTESUR Negative 05/01/2010 1454    Sepsis Labs: Lactic Acid, Venous    Component Value Date/Time   LATICACIDVEN 0.9 11/10/2016 2033    MICROBIOLOGY: Recent Results (from the past 240 hour(s))  Culture, blood (Routine X 2) w Reflex to ID Panel     Status: None   Collection Time: 11/27/16  6:36 PM  Result Value Ref Range Status   Specimen Description BLOOD RIGHT ARM  Final   Special Requests   Final    BOTTLES DRAWN AEROBIC ONLY Blood Culture adequate volume   Culture   Final    NO GROWTH 5 DAYS Performed at [facility], [place] [address], [place], [place] [address]    Report Status 12/02/2016 FINAL  Final  Culture, blood (Routine X 2) w Reflex to ID Panel     Status: None   Collection Time: 11/27/16  6:36 PM  Result Value Ref Range Status   Specimen Description BLOOD RIGHT HAND  Final   Special Requests IN PEDIATRIC BOTTLE Blood Culture adequate volume  Final   Culture   Final    NO GROWTH 5 DAYS Performed at [facility], [place] [address] [place], [place] [address]    Report Status 12/02/2016 FINAL  Final  Urine culture     Status: None   Collection Time: 11/27/16  9:21 PM  Result Value Ref Range Status   Specimen Description URINE, CATHETERIZED  Final   Special Requests NONE  Final   Culture   Final    NO GROWTH Performed at [facility], [place] [address], [place],  [address]    Report Status 11/29/2016 FINAL  Final  Respiratory Panel by PCR     Status: None   Collection Time: 11/28/16  1:35 PM  Result Value Ref Range Status   Adenovirus NOT DETECTED NOT DETECTED Final   Coronavirus 229E NOT DETECTED NOT DETECTED Final   Coronavirus HKU1 NOT DETECTED NOT DETECTED Final   Coronavirus NL63 NOT DETECTED NOT DETECTED Final   Coronavirus OC43 NOT DETECTED NOT DETECTED Final    Metapneumovirus NOT DETECTED NOT DETECTED Final   Rhinovirus / Enterovirus NOT DETECTED NOT DETECTED  Final   Influenza A NOT DETECTED NOT DETECTED Final   Influenza B NOT DETECTED NOT DETECTED Final   Parainfluenza Virus 1 NOT DETECTED NOT DETECTED Final   Parainfluenza Virus 2 NOT DETECTED NOT DETECTED Final   Parainfluenza Virus 3 NOT DETECTED NOT DETECTED Final   Parainfluenza Virus 4 NOT DETECTED NOT DETECTED Final   Respiratory Syncytial Virus NOT DETECTED NOT DETECTED Final   Bordetella pertussis NOT DETECTED NOT DETECTED Final   Chlamydophila pneumoniae NOT DETECTED NOT DETECTED Final   Mycoplasma pneumoniae NOT DETECTED NOT DETECTED Final    Comment: Performed at [facility], [place] [address], [place], [place] [address]    RADIOLOGY STUDIES/RESULTS: Dg Chest 2 View  Result Date: 11/30/2016 CLINICAL DATA:  Acute on chronic respiratory failure with hypercapnia. Ex-smoker. EXAM: CHEST  2 VIEW COMPARISON:  Yesterday. FINDINGS: Normal sized heart. Tortuous aorta. Stable left subclavian pacemaker leads. Stable hyperexpansion of the lungs. Mild diffuse interstitial prominence in the right lung with mild progression. This includes Kerley lines in the lower lung zone. Clear left lung. Small amount of pleural fluid at the posterior lung bases on the lateral view. Mild bilateral shoulder degenerative changes. IMPRESSION: 1. Mildly increased interstitial lung disease on the right with some interstitial pulmonary edema and probable underlying interstitial pneumonitis. 2. Small right pleural effusion. 3. Stable changes of COPD. Electronically Signed   By: Claudie Revering M.D.   On: 11/30/2016 16:13   X-ray Chest Pa And Lateral  Result Date: 11/27/2016 CLINICAL DATA:  Increased weakness, fever and dyspnea over the past several days. EXAM: CHEST  2 VIEW COMPARISON:  11/10/2016. FINDINGS: Heart is within normal limits. Tortuous thoracic aorta with atherosclerosis. Left-sided ICD device projects over  the shoulder with leads in the right atrium and right ventricle. Emphysematous hyperinflation of the lungs. Mild increase in vascular congestion is noted since previous study. No effusion or pneumothorax. No acute osseous abnormality. Degenerative changes are noted about the [facility] and glenohumeral joints. IMPRESSION: 1. COPD with slight increase in vascular congestion since prior exam. No pneumothorax or pneumonic consolidation. 2. Aortic atherosclerosis. Electronically Signed   By: Ashley Royalty M.D.   On: 11/27/2016 20:14   Dg Chest Port 1 View  Result Date: 12/02/2016 CLINICAL DATA:  Acute respiratory failure with hypoxia. EXAM: PORTABLE CHEST 1 VIEW COMPARISON:  Radiographs of November 30, 2016. FINDINGS: Stable cardiomediastinal silhouette. Atherosclerosis of thoracic aorta is noted. Left-sided pacemaker is unchanged in position. Left lung is clear. Stable interstitial prominence is noted in the right lung which may represent scarring, although superimposed acute edema or inflammation cannot be excluded. Bony thorax is unremarkable. IMPRESSION: Aortic atherosclerosis. Stable interstitial prominence is noted in the right lung which may represent scarring, although superimposed acute edema or inflammation cannot be excluded. Electronically Signed   By: Marijo Conception, M.D.   On: 12/02/2016 10:16   Dg Chest Port 1 View  Result Date: 11/29/2016 CLINICAL DATA:  Acute respiratory failure with hypoxemia. Ex-smoker. EXAM: PORTABLE CHEST 1 VIEW COMPARISON:  11/27/2016 and 11/10/2016. FINDINGS: Stable left subclavian pacemaker leads. Normal sized heart. The aorta remains diffusely enlarged and tortuous a partially calcified. Stable hyperexpansion of the lungs. Increased interstitial markings in the right mid and upper lung zone compared to 11/10/2016. Diffuse osteopenia. Superior migration of both humeral heads with bony remodeling. IMPRESSION: 1. Mild interstitial lung disease in the right mid and upper lung zones,  new since 11/10/2016. This has an appearance most compatible with interstitial pneumonitis. Asymmetrical interstitial pulmonary  edema is less likely. 2. Stable changes of COPD. 3. Stable diffusely enlarged, tortuous and partially calcified thoracic aorta. 4. Bilateral large, chronic rotator cuff tears. Electronically Signed   By: Claudie Revering M.D.   On: 11/29/2016 07:52   Dg Chest Port 1 View  Result Date: 11/10/2016 CLINICAL DATA:  Hypoxia and anemia.  COPD exacerbation. EXAM: PORTABLE CHEST 1 VIEW COMPARISON:  10/29/2016 FINDINGS: Emphysematous hyperinflation of the lungs are again noted with minimal scarring at the left lung base. No pneumonic consolidation or overt pulmonary edema. No pneumothorax. No pleural effusion. Heart size is within normal limits with right atrial pacer and right ventricular defibrillator leads in place. Left-sided ICD device projects over the shoulder. Tortuous aorta with aortic atherosclerosis, stable in appearance without aneurysm. Pulmonary vasculature is within normal limits. High-riding humeral heads again noted which may reflect chronic rotator cuff tears. IMPRESSION: 1. 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 2. Stable aortic atherosclerosis. 3. No acute pneumonic consolidation or CHF. Electronically Signed   By: Ashley Royalty M.D.   On: 11/10/2016 17:59     LOS: 6 days   Oren Binet, MD  [facility] Pager:[phone]  If 7PM-7AM, please contact night-coverage [email or web address] Password [email or web address] 12/03/2016, 11:15 AM

## 2016-12-03 NOTE — Progress Notes (Signed)
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

## 2016-12-04 LAB — CBC WITH DIFFERENTIAL/PLATELET
BASOS PCT: 1 %
Basophils Absolute: 0 10*3/uL (ref 0.0–0.1)
EOS ABS: 0 10*3/uL (ref 0.0–0.7)
EOS PCT: 1 %
HCT: 25.1 % — ABNORMAL LOW (ref 39.0–52.0)
Hemoglobin: 8.2 g/dL — ABNORMAL LOW (ref 13.0–17.0)
LYMPHS ABS: 0.6 10*3/uL — AB (ref 0.7–4.0)
Lymphocytes Relative: 18 %
MCH: 24.9 pg — AB (ref 26.0–34.0)
MCHC: 32.7 g/dL (ref 30.0–36.0)
MCV: 76.3 fL — ABNORMAL LOW (ref 78.0–100.0)
Monocytes Absolute: 0.1 10*3/uL (ref 0.1–1.0)
Monocytes Relative: 4 %
NEUTROS PCT: 77 %
Neutro Abs: 2.4 10*3/uL (ref 1.7–7.7)
PLATELETS: 62 10*3/uL — AB (ref 150–400)
RBC: 3.29 MIL/uL — AB (ref 4.22–5.81)
RDW: 19.7 % — ABNORMAL HIGH (ref 11.5–15.5)
WBC: 3.1 10*3/uL — AB (ref 4.0–10.5)

## 2016-12-04 LAB — GLUCOSE, CAPILLARY: GLUCOSE-CAPILLARY: 121 mg/dL — AB (ref 65–99)

## 2016-12-04 MED ORDER — CLOPIDOGREL BISULFATE 75 MG PO TABS: 75.0000 mg | ORAL_TABLET | Freq: Every day | ORAL | Status: DC

## 2016-12-04 MED ORDER — VITAMIN B-12 1000 MCG PO TABS
1000.0000 ug | ORAL_TABLET | Freq: Every day | ORAL | Status: DC
  Administered 2016-12-04 – 2016-12-08 (×5): 1000 ug via ORAL
  Filled 2016-12-04 (×5): qty 1

## 2016-12-04 NOTE — Progress Notes (Signed)
Date: December 04, 2016 Rhonda Davis, BSN, RN3, CCM  [phone] Chart and notes review for patient progress and needs. Will follow for case management and discharge needs. Next review date: [id number] 

## 2016-12-04 NOTE — Progress Notes (Signed)
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    SUBJECTIVE: No acute events overnight.  Patient underwent bone marrow biopsy on 11/7 with results pending.  VITAL SIGNS: BP (!) 133/57 (BP Location: Left Arm)   Pulse 79   Temp 98.4 F (36.9 C) (Oral)   Resp (!) 25   Ht 6' (1.829 m)   Wt 155 lb 10.3 oz (70.6 kg)   SpO2 95%   BMI 21.11 kg/m   HEMODYNAMICS:    VENTILATOR SETTINGS:    INTAKE / OUTPUT: I/O last 3 completed shifts: In: 360 [P.O.:360] Out: 1725 [Urine:1725]  PHYSICAL EXAMINATION: General: Frail elderly male lying in bed in NAD HEENT: MM pink/moist, no jvd Neuro: Awakens to voice, MAE, intermittent confusion CV: s1s2 rrr, no m/r/g PULM: even/non-labored, lungs bilaterally diminished without wheeze AT:FTDD, non-tender, bsx4 active  Extremities: warm/dry, no edema  Skin: no rashes or lesions   LABS:  BMET Recent Labs  Lab 12/01/16 0411 12/02/16 0350 12/03/16 0338  NA 142 140 139  K 4.1 4.3 4.6  CL 103 101 102  CO2 '30 28 28  '$ BUN 38* 35* 33*  CREATININE 1.04 1.03 0.99  GLUCOSE 94 84 107*    Electrolytes Recent Labs  Lab 12/01/16 0411 12/02/16 0350 12/03/16 0338  CALCIUM 8.3* 8.2* 8.2*    CBC Recent Labs  Lab 12/02/16 0350 12/03/16 0338 12/04/16 0342  WBC 2.9* 2.7* 3.1*  HGB 8.3* 8.0* 8.2*  HCT 26.1* 25.1* 25.1*  PLT 64* 65* 62*    Coag's No results for input(s): APTT, INR in the last 168 hours.  Sepsis Markers No results for input(s): LATICACIDVEN, PROCALCITON, O2SATVEN in the last 168 hours.  ABG No results for input(s): PHART, PCO2ART, PO2ART in the last 168 hours.  Liver Enzymes Recent Labs  Lab  12/01/16 0411 12/02/16 0350 12/03/16 0338  AST 13* 12* 17  ALT 16* 15* 12*  ALKPHOS 42 39 39  BILITOT 0.6 0.7 0.9  ALBUMIN 2.3* 2.2* 2.2*    Cardiac Enzymes Recent Labs  Lab 11/27/16 1700  TROPONINI 0.03*    Glucose Recent Labs  Lab 11/29/16 0803 11/30/16 0747 12/01/16 0752 12/02/16 0740 12/03/16 0738 12/04/16 0755  GLUCAP 102* 100* 97 95 90 121*    Imaging Ct Biopsy  Result Date: 12/03/2016 INDICATION: 83 year old male with a history of anemia EXAM: CT BIOPSY; CT BONE MARROW BIOPSY AND ASPIRATION MEDICATIONS: None. ANESTHESIA/SEDATION: Moderate (conscious) sedation was employed during this procedure. A total of Versed 1.0 mg and Fentanyl 50 mcg was administered intravenously. Moderate Sedation Time: 10 minutes. The patient's level of consciousness and vital signs were monitored continuously by radiology nursing throughout the procedure under my direct supervision. FLUOROSCOPY TIME:  CT COMPLICATIONS: None PROCEDURE: The procedure risks, benefits, and alternatives were explained to the patient. Questions regarding the procedure were encouraged and answered. The patient understands and consents to the procedure. Scout CT of the pelvis was performed for surgical planning purposes. The posterior pelvis was prepped with Betadinein a sterile fashion, and a sterile drape was applied covering the operative field. A sterile gown and sterile gloves were used for the procedure. Local anesthesia was provided with 1% Lidocaine. We targeted the  right posterior iliac bone for biopsy. The skin and subcutaneous tissues were infiltrated with 1% lidocaine without epinephrine. A small stab incision was made with an 11 blade scalpel, and an 11 gauge Murphy needle was advanced with CT guidance to the posterior cortex. Manual forced was used to advance the needle through the posterior cortex and the stylet was removed. A bone marrow aspirate was retrieved and passed to a cytotechnologist in the room. The  Murphy needle was then advanced without the stylet for a core biopsy. The core biopsy was retrieved and also passed to a cytotechnologist. Manual pressure was used for hemostasis and a sterile dressing was placed. No complications were encountered no significant blood loss was encountered. Patient tolerated the procedure well and remained hemodynamically stable throughout. IMPRESSION: Status post CT-guided bone marrow biopsy, with tissue specimen sent to pathology for complete histopathologic analysis Signed, Dulcy Fanny. Earleen Newport, DO [facility] Rad[facility] Electronically Signed   By: Corrie Mckusick D.O.   On: 12/03/2016 12:18   Ct Bone Marrow Biopsy & Aspiration  Result Date: 12/03/2016 INDICATION: 83 year old male with a history of anemia EXAM: CT BIOPSY; CT BONE MARROW BIOPSY AND ASPIRATION MEDICATIONS: None. ANESTHESIA/SEDATION: Moderate (conscious) sedation was employed during this procedure. A total of Versed 1.0 mg and Fentanyl 50 mcg was administered intravenously. Moderate Sedation Time: 10 minutes. The patient's level of consciousness and vital signs were monitored continuously by radiology nursing throughout the procedure under my direct supervision. FLUOROSCOPY TIME:  CT COMPLICATIONS: None PROCEDURE: The procedure risks, benefits, and alternatives were explained to the patient. Questions regarding the procedure were encouraged and answered. The patient understands and consents to the procedure. Scout CT of the pelvis was performed for surgical planning purposes. The posterior pelvis was prepped with Betadinein a sterile fashion, and a sterile drape was applied covering the operative field. A sterile gown and sterile gloves were used for the procedure. Local anesthesia was provided with 1% Lidocaine. We targeted the right posterior iliac bone for biopsy. The skin and subcutaneous tissues were infiltrated with 1% lidocaine without epinephrine. A small stab  incision was made with an 11 blade scalpel, and an 11 gauge Murphy needle was advanced with CT guidance to the posterior cortex. Manual forced was used to advance the needle through the posterior cortex and the stylet was removed. A bone marrow aspirate was retrieved and passed to a cytotechnologist in the room. The Murphy needle was then advanced without the stylet for a core biopsy. The core biopsy was retrieved and also passed to a cytotechnologist. Manual pressure was used for hemostasis and a sterile dressing was placed. No complications were encountered no significant blood loss was encountered. Patient tolerated the procedure well and remained hemodynamically stable throughout. IMPRESSION: Status post CT-guided bone marrow biopsy, with tissue specimen sent to pathology for complete histopathologic analysis Signed, Dulcy Fanny. Earleen Newport, DO [facility] Electronically Signed   By: Corrie Mckusick D.O.   On: 12/03/2016 12:18     STUDIES:  11/1 CXR > severe emphysema, ? Vascular congestion  CULTURES: 11/1 blood >> negative 11/1 urine >> negative 11/2 resp viral panel >> negative  ANTIBIOTICS: 11/1 vanc x1 11/1 cefepime x1   SIGNIFICANT EVENTS: 11/01 admitted with fever and weakness 11/07 bone marrow biopsy  LINES/TUBES:   DISCUSSION: 83 y/o male with severe COPD admitted 11/1 with fever and weakness in the setting of multiple recent hospitalizations and unexplained pancytopenia.  Had vocal cord dysfunction and  anxiety on 11/2.  Improved with PRN ativan.    ASSESSMENT / PLAN:  PULMONARY A: Acute on Chronic respiratory failure with hypoxemia Severe anxiety, vocal cord dysfunction Severe emphysema COPD exacerbation Likely viral bronchitis P:   Continue Brovana and Pulmicort Change albuterol to PRN given tachycardia Wean oxygen for saturations >88% Monitor off antibiotics Continue pulmonary hygiene-IS, mobilize Mucinex   Continue fluticasone nasal spray Aspiration precautions   All issues below per TRH History of nonischemic cardiomyopathy, 2017 LVEF 55% CKD BPH, multiple in/out caths (home regimen)  Dysphagia? Weight loss Pancytopenia   Fever, no pneumonia DM2 Generalized weakness Anxiety   CODE STATUS: DNR  FAMILY  - Updates: No family at bedside a.m. 11/8.  Await bone marrow biopsy results.  Suspect patient will need skilled nursing facility for rehab efforts.  PCCM will continue to follow at family's request.  Noe Gens, NP-C [facility] Pgr: [phone] or if no answer [phone] 12/04/2016, 10:01 AM

## 2016-12-04 NOTE — Progress Notes (Addendum)
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 Sex: male Date of Birth: 01/07/1926 Admit Date: 11/27/2016 Admitting Physician Javier Glazier, MD [name], Denton Ar, MD  Brief Narrative: Patient is a 83 y.o. Male with past medical history of severe COPD, on home O2, chronic iron deficiency anemia, recent hospitalizations for COPD exacerbation/pneumonia, admitted for fever and weakness. The patient was admitted by pulmonary critical care to the ICU, he was given steroids, bronchodilators. He was found to have pancytopenia. Plans are to pursue any bone marrow biopsy today. See below for further details.  Subjective: No major issues overnight-very weak and deconditioned, free of and chronically sick appearing.  Assessment/Plan: Acute on chronic hypoxic respiratory failure: Felt to be multifactorial-probable COPD exacerbation (severe COPD at baseline) with a viral bronchitis playing a role. Furthermore, anemia could have worsened shortness of breath. Initially managed with bronchodilators, has completed a course of steroids. Continue current inhaler regimen-he is really not wheezing. Okay to transfer to floor today, he unfortunately has numerous recent rehospitalizations upon going home, after talking with patient, and with the patient's daughter over the phone, and they seem to be open to going to SNF for subacute rehabilitation. He is so frail, I do not think he will tolerate rehabilitation at [facility].   COPD exacerbation: Continues to improve-not on steroids or antibiotics. Continue bronchodilators, pulmonary hygiene. Continue to mobilize. He is very deconditioned, very frail-and will likely benefit from SNF-subacute rehabilitation. Furthermore, it appears that he has advanced COPD at baseline-he is already on home O2-given his frailty-recurrent hospitalization-I suspect at some point (hopefully after bone marrow biopsy) and we can begin engaging  family regarding poor long-term prognosis, if he continues to have recurrent hospitalization, he may even benefit from initiation of hospice/palliative care.  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 Underwent bone marrow biopsy on 11/7, awaiting official results. He is clearly not a candidate for aggressive care, will probably require erythropoietin and other bone marrow stimulanting agents. Spoke with hematology today-they plan on following patient next week-he apparently has a previously scheduled appointment on Tuesday, where they can discuss results of bone marrow biopsy and prescribe erythropoietin if necessary. CBC is relatively stable when compared to the past few days.  History of chronic diastolic heart failure: Currently compensated. Continue metoprolol.  History of CAD: Currently without chest pain, continue  statin and metoprolol. 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  Dyslipidemia: Continue statin  Anxiety: Currently appears to be stable, continue as needed Xanax.  GERD: Continue PPI  Debility/frailty/failure to thrive syndrome: 83 year old male with advanced COPD on home O2-with numerous recent hospitalizations-with possible myelodysplastic syndrome-(awaiting bone marrow biopsy results)-admitted again with worsening shortness of breath, felt to be viral bronchitis causing COPD exacerbation. He is incredibly frail, and chronically sick appearing- although anemia may be playing a role in worsening shortness of breath, I think that advanced age/debility and severe COPD are also contributing significantly to his overall poor health. Will await bone marrow biopsy results-but did speak with the patient's family today and have outlined my concern  for overall poor prognoses. Both patient and family seem to be open for short-term  SNF-for subacute rehabilitation. He is a DO NOT RESUSCITATE, if bone marrow biopsy does confirm myelodysplastic syndrome-and if he continues to have recurrent hospitalization/shortness of breath, we may need to engage palliative/hospice care services.  DVT Prophylaxis: SCD's  Code Status: DNR  Family Communication: Spoke with patient's daughter over the phone  Disposition Plan: Remain inpatient-transfer to floor-hopefully SNF soon.  Antimicrobial agents: Anti-infectives (From admission, onward)   Start     Dose/Rate Route Frequency Ordered Stop   11/28/16 2000  vancomycin (VANCOCIN) IVPB 1000 mg/200 mL premix  Status:  Discontinued     1,000 mg 200 mL/hr over 60 Minutes Intravenous Every 24 hours 11/27/16 1741 11/27/16 2016   11/28/16 2000  vancomycin (VANCOCIN) IVPB 750 mg/150 ml premix  Status:  Discontinued     750 mg 150 mL/hr over 60 Minutes Intravenous Every 24 hours 11/27/16 2016 11/28/16 1049   11/27/16 1830  vancomycin (VANCOCIN) 1,500 mg in sodium chloride 0.9 % 500 mL IVPB     1,500 mg 250 mL/hr over 120 Minutes Intravenous  Once 11/27/16 1741 11/27/16 2129   11/27/16 1800  ceFEPIme (MAXIPIME) 2 g in dextrose 5 % 50 mL IVPB  Status:  Discontinued     2 g 100 mL/hr over 30 Minutes Intravenous Every 24 hours 11/27/16 1712 11/28/16 1131      Procedures: None  CONSULTS:  pulmonary/intensive care and hematology/oncology  Time spent: 25 minutes-Greater than 50% of this time was spent in counseling, explanation of diagnosis, planning of further management, and coordination of care.  MEDICATIONS: Scheduled Meds: . arformoterol  15 mcg Nebulization BID  . budesonide  0.5 mg Nebulization BID  . fluticasone  1 spray Each Nare Daily  . guaiFENesin  600 mg Oral BID  . metoprolol tartrate  12.5 mg Oral BID  . pantoprazole  40 mg Oral Daily  . rosuvastatin  5 mg Oral Daily  . sodium  chloride flush  3 mL Intravenous Q12H  . vitamin B-12  1,000 mcg Oral Daily  . ascorbic acid  250 mg Oral BID   Continuous Infusions: . sodium chloride    . sodium chloride 10 mL/hr at 11/30/16 0600   PRN Meds:.sodium chloride, acetaminophen **OR** acetaminophen, albuterol, ALPRAZolam, docusate sodium, magnesium hydroxide, ondansetron **OR** ondansetron (ZOFRAN) IV, senna-docusate, sodium chloride flush   PHYSICAL EXAM: Vital signs: Vitals:   12/04/16 0800 12/04/16 0806 12/04/16 0939 12/04/16 1000  BP: (!) 133/57   117/67  Pulse:   79   Resp: (!) 25   (!) 27  Temp: 98.4 F (36.9 C)     TempSrc: Oral     SpO2: 93% 95%  99%  Weight:      Height:       Filed Weights   12/02/16 0455 12/03/16 0350 12/04/16 0500  Weight: 71.1 kg (156 lb 12 oz) 70.8 kg (156 lb 1.4 oz) 70.6 kg (155 lb 10.3 oz)   Body mass index is 21.11 kg/m.   General appearance: Awake, alert-very frail, very sick appearing.  Eyes: No scleral icterus, pupils equally reactive to light. HEENT: Atraumatic, normocephalic. Neck: Supple, no JVD. Resp: Moving air bilaterally-but decreased at the bases-no rhonchi CVS: S1-S2 regular.  GI: Soft, nontender nondistended. Extremities: No edema, both lower extremities are warm to touch. Neurology: Nonfocal exam, speech slow but clear.  Psychiatric: Alert and oriented 3.  Skin: No rash Wounds: Some ecchymosis in his hands-but no open wounds.  I have personally reviewed following labs and imaging studies  LABORATORY DATA: CBC: Recent  Labs  Lab 11/30/16 6761 12/01/16 0411 12/02/16 0350 12/03/16 0338 12/04/16 0342  WBC 3.2* 3.5* 2.9* 2.7* 3.1*  NEUTROABS 2.5 3.0 2.2 2.0 2.4  HGB 8.2* 8.6* 8.3* 8.0* 8.2*  HCT 25.7* 26.8* 26.1* 25.1* 25.1*  MCV 75.6* 76.1* 76.3* 76.8* 76.3*  PLT 48* 52* 64* 65* 62*    Basic Metabolic Panel: Recent Labs  Lab 11/29/16 0349 11/30/16 0833 12/01/16 0411 12/02/16 0350 12/03/16 0338  NA 139 140 142 140 139  K 4.1 3.7 4.1 4.3  4.6  CL 104 102 103 101 102  CO2 '27 29 30 28 28  '$ GLUCOSE 107* 94 94 84 107*  BUN 31* 43* 38* 35* 33*  CREATININE 1.28* 1.34* 1.04 1.03 0.99  CALCIUM 8.1* 8.5* 8.3* 8.2* 8.2*    GFR: Estimated Creatinine Clearance: 50.5 mL/min (by C-G formula based on SCr of 0.99 mg/dL).  Liver Function Tests: Recent Labs  Lab 11/27/16 1700 11/30/16 0833 12/01/16 0411 12/02/16 0350 12/03/16 0338  AST 16 15 13* 12* 17  ALT 15* 15* 16* 15* 12*  ALKPHOS 41 42 42 39 39  BILITOT 0.8 0.4 0.6 0.7 0.9  PROT 6.1* 5.6* 5.6* 5.5* 5.4*  ALBUMIN 2.7* 2.2* 2.3* 2.2* 2.2*   No results for input(s): LIPASE, AMYLASE in the last 168 hours. No results for input(s): AMMONIA in the last 168 hours.  Coagulation Profile: No results for input(s): INR, PROTIME in the last 168 hours.  Cardiac Enzymes: Recent Labs  Lab 11/27/16 1700  TROPONINI 0.03*    BNP (last 3 results) No results for input(s): PROBNP in the last 8760 hours.  HbA1C: No results for input(s): HGBA1C in the last 72 hours.  CBG: Recent Labs  Lab 11/30/16 0747 12/01/16 0752 12/02/16 0740 12/03/16 0738 12/04/16 0755  GLUCAP 100* 97 95 90 121*    Lipid Profile: No results for input(s): CHOL, HDL, LDLCALC, TRIG, CHOLHDL, LDLDIRECT in the last 72 hours.  Thyroid Function Tests: No results for input(s): TSH, T4TOTAL, FREET4, T3FREE, THYROIDAB in the last 72 hours.  Anemia Panel: No results for input(s): VITAMINB12, FOLATE, FERRITIN, TIBC, IRON, RETICCTPCT in the last 72 hours.  Urine analysis:    Component Value Date/Time   COLORURINE YELLOW 11/27/2016 2121   APPEARANCEUR CLOUDY (A) 11/27/2016 2121   LABSPEC 1.018 11/27/2016 2121   LABSPEC 1.025 05/01/2010 1454   PHURINE 5.0 11/27/2016 2121   GLUCOSEU NEGATIVE 11/27/2016 2121   HGBUR NEGATIVE 11/27/2016 2121   BILIRUBINUR NEGATIVE 11/27/2016 2121   BILIRUBINUR Negative 05/01/2010 1454   KETONESUR 5 (A) 11/27/2016 2121   PROTEINUR 30 (A) 11/27/2016 2121   UROBILINOGEN 0.2  06/07/2014 2018   NITRITE NEGATIVE 11/27/2016 2121   LEUKOCYTESUR NEGATIVE 11/27/2016 2121   LEUKOCYTESUR Negative 05/01/2010 1454    Sepsis Labs: Lactic Acid, Venous    Component Value Date/Time   LATICACIDVEN 0.9 11/10/2016 2033    MICROBIOLOGY: Recent Results (from the past 240 hour(s))  Culture, blood (Routine X 2) w Reflex to ID Panel     Status: None   Collection Time: 11/27/16  6:36 PM  Result Value Ref Range Status   Specimen Description BLOOD RIGHT ARM  Final   Special Requests   Final    BOTTLES DRAWN AEROBIC ONLY Blood Culture adequate volume   Culture   Final    NO GROWTH 5 DAYS Performed at [facility], [place] [address], [place], [place] [address]    Report Status 12/02/2016 FINAL  Final  Culture, blood (Routine X 2)  w Reflex to ID Panel     Status: None   Collection Time: 11/27/16  6:36 PM  Result Value Ref Range Status   Specimen Description BLOOD RIGHT HAND  Final   Special Requests IN PEDIATRIC BOTTLE Blood Culture adequate volume  Final   Culture   Final    NO GROWTH 5 DAYS Performed at [facility], [address] [place], [place] [address]    Report Status 12/02/2016 FINAL  Final  Urine culture     Status: None   Collection Time: 11/27/16  9:21 PM  Result Value Ref Range Status   Specimen Description URINE, CATHETERIZED  Final   Special Requests NONE  Final   Culture   Final    NO GROWTH Performed at [facility], [place] [address] [place], [place] [address]    Report Status 11/29/2016 FINAL  Final  Respiratory Panel by PCR     Status: None   Collection Time: 11/28/16  1:35 PM  Result Value Ref Range Status   Adenovirus NOT DETECTED NOT DETECTED Final   Coronavirus 229E NOT DETECTED NOT DETECTED Final   Coronavirus HKU1 NOT DETECTED NOT DETECTED Final   Coronavirus NL63 NOT DETECTED NOT DETECTED Final   Coronavirus OC43 NOT DETECTED NOT DETECTED Final   Metapneumovirus NOT DETECTED NOT DETECTED Final   Rhinovirus / Enterovirus  NOT DETECTED NOT DETECTED Final   Influenza A NOT DETECTED NOT DETECTED Final   Influenza B NOT DETECTED NOT DETECTED Final   Parainfluenza Virus 1 NOT DETECTED NOT DETECTED Final   Parainfluenza Virus 2 NOT DETECTED NOT DETECTED Final   Parainfluenza Virus 3 NOT DETECTED NOT DETECTED Final   Parainfluenza Virus 4 NOT DETECTED NOT DETECTED Final   Respiratory Syncytial Virus NOT DETECTED NOT DETECTED Final   Bordetella pertussis NOT DETECTED NOT DETECTED Final   Chlamydophila pneumoniae NOT DETECTED NOT DETECTED Final   Mycoplasma pneumoniae NOT DETECTED NOT DETECTED Final    Comment: Performed at [facility], [place] [address], [place], [place] [address]    RADIOLOGY STUDIES/RESULTS: Dg Chest 2 View  Result Date: 11/30/2016 CLINICAL DATA:  Acute on chronic respiratory failure with hypercapnia. Ex-smoker. EXAM: CHEST  2 VIEW COMPARISON:  Yesterday. FINDINGS: Normal sized heart. Tortuous aorta. Stable left subclavian pacemaker leads. Stable hyperexpansion of the lungs. Mild diffuse interstitial prominence in the right lung with mild progression. This includes Kerley lines in the lower lung zone. Clear left lung. Small amount of pleural fluid at the posterior lung bases on the lateral view. Mild bilateral shoulder degenerative changes. IMPRESSION: 1. Mildly increased interstitial lung disease on the right with some interstitial pulmonary edema and probable underlying interstitial pneumonitis. 2. Small right pleural effusion. 3. Stable changes of COPD. Electronically Signed   By: Claudie Revering M.D.   On: 11/30/2016 16:13   X-ray Chest Pa And Lateral  Result Date: 11/27/2016 CLINICAL DATA:  Increased weakness, fever and dyspnea over the past several days. EXAM: CHEST  2 VIEW COMPARISON:  11/10/2016. FINDINGS: Heart is within normal limits. Tortuous thoracic aorta with atherosclerosis. Left-sided ICD device projects over the shoulder with leads in the right atrium and right ventricle.  Emphysematous hyperinflation of the lungs. Mild increase in vascular congestion is noted since previous study. No effusion or pneumothorax. No acute osseous abnormality. Degenerative changes are noted about the [facility] and glenohumeral joints. IMPRESSION: 1. COPD with slight increase in vascular congestion since prior exam. No pneumothorax or pneumonic consolidation. 2. Aortic atherosclerosis.  Electronically Signed   By: Ashley Royalty M.D.   On: 11/27/2016 20:14   Ct Biopsy  Result Date: 12/03/2016 INDICATION: 83 year old male with a history of anemia EXAM: CT BIOPSY; CT BONE MARROW BIOPSY AND ASPIRATION MEDICATIONS: None. ANESTHESIA/SEDATION: Moderate (conscious) sedation was employed during this procedure. A total of Versed 1.0 mg and Fentanyl 50 mcg was administered intravenously. Moderate Sedation Time: 10 minutes. The patient's level of consciousness and vital signs were monitored continuously by radiology nursing throughout the procedure under my direct supervision. FLUOROSCOPY TIME:  CT COMPLICATIONS: None PROCEDURE: The procedure risks, benefits, and alternatives were explained to the patient. Questions regarding the procedure were encouraged and answered. The patient understands and consents to the procedure. Scout CT of the pelvis was performed for surgical planning purposes. The posterior pelvis was prepped with Betadinein a sterile fashion, and a sterile drape was applied covering the operative field. A sterile gown and sterile gloves were used for the procedure. Local anesthesia was provided with 1% Lidocaine. We targeted the right posterior iliac bone for biopsy. The skin and subcutaneous tissues were infiltrated with 1% lidocaine without epinephrine. A small stab incision was made with an 11 blade scalpel, and an 11 gauge Murphy needle was advanced with CT guidance to the posterior cortex. Manual forced was used to advance the needle through the posterior cortex and the stylet was removed. A bone marrow  aspirate was retrieved and passed to a cytotechnologist in the room. The Murphy needle was then advanced without the stylet for a core biopsy. The core biopsy was retrieved and also passed to a cytotechnologist. Manual pressure was used for hemostasis and a sterile dressing was placed. No complications were encountered no significant blood loss was encountered. Patient tolerated the procedure well and remained hemodynamically stable throughout. IMPRESSION: Status post CT-guided bone marrow biopsy, with tissue specimen sent to pathology for complete histopathologic analysis Signed, Dulcy Fanny. Earleen Newport, DO [facility] Radiology Electronically Signed   By: Corrie Mckusick D.O.   On: 12/03/2016 12:18   Dg Chest Port 1 View  Result Date: 12/02/2016 CLINICAL DATA:  Acute respiratory failure with hypoxia. EXAM: PORTABLE CHEST 1 VIEW COMPARISON:  Radiographs of November 30, 2016. FINDINGS: Stable cardiomediastinal silhouette. Atherosclerosis of thoracic aorta is noted. Left-sided pacemaker is unchanged in position. Left lung is clear. Stable interstitial prominence is noted in the right lung which may represent scarring, although superimposed acute edema or inflammation cannot be excluded. Bony thorax is unremarkable. IMPRESSION: Aortic atherosclerosis. Stable interstitial prominence is noted in the right lung which may represent scarring, although superimposed acute edema or inflammation cannot be excluded. Electronically Signed   By: Marijo Conception, M.D.   On: 12/02/2016 10:16   Dg Chest Port 1 View  Result Date: 11/29/2016 CLINICAL DATA:  Acute respiratory failure with hypoxemia. Ex-smoker. EXAM: PORTABLE CHEST 1 VIEW COMPARISON:  11/27/2016 and 11/10/2016. FINDINGS: Stable left subclavian pacemaker leads. Normal sized heart. The aorta remains diffusely enlarged and tortuous a partially calcified. Stable hyperexpansion of the lungs. Increased interstitial markings in the  right mid and upper lung zone compared to 11/10/2016. Diffuse osteopenia. Superior migration of both humeral heads with bony remodeling. IMPRESSION: 1. Mild interstitial lung disease in the right mid and upper lung zones, new since 11/10/2016. This has an appearance most compatible with interstitial pneumonitis. Asymmetrical interstitial pulmonary edema is less likely. 2. Stable changes of COPD. 3. Stable diffusely enlarged, tortuous and partially calcified thoracic aorta. 4. Bilateral large, chronic rotator  cuff tears. Electronically Signed   By: Claudie Revering M.D.   On: 11/29/2016 07:52   Dg Chest Port 1 View  Result Date: 11/10/2016 CLINICAL DATA:  Hypoxia and anemia.  COPD exacerbation. EXAM: PORTABLE CHEST 1 VIEW COMPARISON:  10/29/2016 FINDINGS: Emphysematous hyperinflation of the lungs are again noted with minimal scarring at the left lung base. No pneumonic consolidation or overt pulmonary edema. No pneumothorax. No pleural effusion. Heart size is within normal limits with right atrial pacer and right ventricular defibrillator leads in place. Left-sided ICD device projects over the shoulder. Tortuous aorta with aortic atherosclerosis, stable in appearance without aneurysm. Pulmonary vasculature is within normal limits. High-riding humeral heads again noted which may reflect chronic rotator cuff tears. IMPRESSION: 1. 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 2. Stable aortic atherosclerosis. 3. No acute pneumonic consolidation or CHF. Electronically Signed   By: Ashley Royalty M.D.   On: 11/10/2016 17:59   Ct Bone Marrow Biopsy & Aspiration  Result Date: 12/03/2016 INDICATION: 84 year old male with a history of anemia EXAM: CT BIOPSY; CT BONE MARROW BIOPSY AND ASPIRATION MEDICATIONS: None. ANESTHESIA/SEDATION: Moderate (conscious) sedation was employed during this procedure. A total of Versed 1.0 mg and Fentanyl 50 mcg was administered intravenously. Moderate Sedation Time: 10  minutes. The patient's level of consciousness and vital signs were monitored continuously by radiology nursing throughout the procedure under my direct supervision. FLUOROSCOPY TIME:  CT COMPLICATIONS: None PROCEDURE: The procedure risks, benefits, and alternatives were explained to the patient. Questions regarding the procedure were encouraged and answered. The patient understands and consents to the procedure. Scout CT of the pelvis was performed for surgical planning purposes. The posterior pelvis was prepped with Betadinein a sterile fashion, and a sterile drape was applied covering the operative field. A sterile gown and sterile gloves were used for the procedure. Local anesthesia was provided with 1% Lidocaine. We targeted the right posterior iliac bone for biopsy. The skin and subcutaneous tissues were infiltrated with 1% lidocaine without epinephrine. A small stab incision was made with an 11 blade scalpel, and an 11 gauge Murphy needle was advanced with CT guidance to the posterior cortex. Manual forced was used to advance the needle through the posterior cortex and the stylet was removed. A bone marrow aspirate was retrieved and passed to a cytotechnologist in the room. The Murphy needle was then advanced without the stylet for a core biopsy. The core biopsy was retrieved and also passed to a cytotechnologist. Manual pressure was used for hemostasis and a sterile dressing was placed. No complications were encountered no significant blood loss was encountered. Patient tolerated the procedure well and remained hemodynamically stable throughout. IMPRESSION: Status post CT-guided bone marrow biopsy, with tissue specimen sent to pathology for complete histopathologic analysis Signed, Dulcy Fanny. Earleen Newport, DO [facility] Radiology Electronically Signed   By: Corrie Mckusick D.O.   On: 12/03/2016 12:18     LOS: 7 days   Oren Binet, MD  [facility] Pager:[phone]  If 7PM-7AM, please contact night-coverage [email or web address] Password [email or web address] 12/04/2016, 12:03 PM

## 2016-12-04 NOTE — Plan of Care (Signed)
  Progressing Health Behavior/Discharge Planning: Ability to manage health-related needs will improve 12/04/2016 1158 - Progressing by Naomie Dean, RN Pain Managment: General experience of comfort will improve 12/04/2016 1158 - Progressing by Naomie Dean, RN Physical Regulation: Ability to maintain clinical measurements within normal limits will improve 12/04/2016 1158 - Progressing by Naomie Dean, RN Will remain free from infection 12/04/2016 1158 - Progressing by Naomie Dean, RN Skin Integrity: Risk for impaired skin integrity will decrease 12/04/2016 1158 - Progressing by Naomie Dean, RN Tissue Perfusion: Risk factors for ineffective tissue perfusion will decrease 12/04/2016 1158 - Progressing by Naomie Dean, RN Activity: Risk for activity intolerance will decrease 12/04/2016 1158 - Progressing by Naomie Dean, RN Fluid Volume: Ability to maintain a balanced intake and output will improve 12/04/2016 1158 - Progressing by Naomie Dean, RN

## 2016-12-04 NOTE — Clinical Social Work Note (Signed)
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 Date of Birth: [birth year]  Date of referral:  12/04/16               Reason for consult:  Facility Placement                Permission sought to share information with:  Family Supports, Chartered certified accountant granted to share information::  Yes, Verbal Permission Granted  Name::        Agency::     Relationship::  Daughter   Contact Information:     Housing/Transportation Living arrangements for the past 2 months:  Single Family Home Source of Information:  Adult Children Patient Interpreter Needed:  None Criminal Activity/Legal Involvement Pertinent to Current Situation/Hospitalization:  No - Comment as needed Significant Relationships:  Adult Children Lives with:    Do you feel safe going back to the place where you live?  No Need for family participation in patient care:  Yes (Comment)   Medical History of severe COPD, on home o2, chronic iron deficiency anemia, recent hospitalizations for COPD exacerbation/pnuemonia and admitted for fever and weakness.  Care giving concerns:  Patient and family are open to short rehab for subacute rehab.   Social Worker assessment / plan:  CSW spoke with patient and daughter-[name] by phone. Patient and daughter both agreeable to SNF discharge plan. She reports the patient lives alone and has part assistance Patient daughter reports this is first time the patient has been placed at a facility.   She is unfamiliar SNF with the process but states she prefers a facility in the city limits.  CSW explain SNF process, daughter reports understanding.   CSW to complete assessment/fl2 fax out to facilities in [place] area.   Plan: Assist with discharge to SNF.   Employment status:  Retired Health visitor PT Recommendations:  Home with [employer], [place] / Referral to community  resources:     Patient/Family's Response to care:  Agreeable and Responding well to care. Appreciative of CSW services.   Patient/Family's Understanding of and Emotional Response to Diagnosis, Current Treatment, and Prognosis: Daughter reports our goal is for him to have physical therapy for couple of weeks and or so and then for him to come home and plan to increase his home assistance as needed."  Emotional Assessment Appearance:    Attitude/Demeanor/Rapport:    Affect (typically observed):  Calm Orientation:  Oriented to Self, Oriented to Place, Oriented to  Time, Oriented to Situation Alcohol / Substance use:    Psych involvement (Current and /or in the community):  No (Comment)  Discharge Needs  Concerns to be addressed:    Readmission within the last 30 days:  Yes Current discharge risk:  None Barriers to Discharge:  Continued Medical Work up   [employer], LCSW 12/04/2016, 1:16 PM

## 2016-12-04 NOTE — Progress Notes (Signed)
Per unit protocol, attempted to remove foley catheter but patient refused. Patient is alert and oriented X4 and was educated twice about the risk for infection if foley catheter is not removed. Patient verbalized understanding of the risk for infection and stated that he feels as if in/out cath (which he does at home) is ineffective and that there is a risk for infection with that as well. Foley care performed and obtained order from MD to maintain the foley catheter in.   Will continue to monitor.  Naomie Dean, RN

## 2016-12-04 NOTE — NC FL2 (Signed)
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 Cough   . Pancytopenia ([place])   . Acute respiratory failure with hypoxemia ([place]) 11/27/2016  . Physical deconditioning   . Post-nasal drip   . Acute on chronic respiratory failure with hypoxia ([place]) 11/10/2016  . Acute renal failure superimposed on chronic kidney disease ([place]) 10/29/2016  . CAP (community acquired pneumonia) 10/29/2016  . Acute on chronic respiratory acidosis 10/29/2016  . Lobar pneumonia ([place])   . Chronic diastolic CHF (congestive heart failure) ([place])   . Chronic respiratory failure with hypoxia ([place]) 10/24/2016  . Presbyesophagus   . Acute on chronic respiratory failure ([place])   . Microcytic anemia   . Dysphagia   . Cardiomyopathy, ischemic   . Sepsis ([place]) 06/11/2015  . COPD with exacerbation ([place]) 11/21/2013  . Diverticulitis of colon with perforation 11/21/2013  . Severe sepsis ([place]) 11/21/2013  . Adynamic ileus ([place]) 11/21/2013  . COPD exacerbation ([place]) 11/21/2013  . UTI (urinary tract infection), bacterial 11/21/2013  . Severe protein-calorie malnutrition ([place]) 11/21/2013  . Hypernatremia 11/17/2013  . Preop pulmonary/respiratory exam 11/15/2013  . Preoperative respiratory examination 11/14/2013  . Acute renal failure ([place]) 07/01/2013  . Low  back pain 07/01/2013  . UTI (urinary tract infection) 07/01/2013  . Chronic systolic HF (heart failure) ([place]) 07/01/2013  . Dysphagia, unspecified(787.20) 10/27/2012  . Acute on chronic renal insufficiency 03/12/2012  . MGUS (monoclonal gammopathy of unknown significance) 03/12/2012  . Thrombocytopenia ([place]) 03/12/2012  . Beta thalassemia trait 03/12/2012  . UTI (lower urinary tract infection) 03/12/2012  . Gait instability 03/12/2012  . Fall at home 03/12/2012  . Automatic implantable cardioverter-defibrillator in situ 12/09/2011  . Ischemic cardiomyopathy 04/14/2011  . Cyanocobalamin deficiency 12/19/2010  . CHF (congestive heart failure) ([place]) 10/09/2010  . Edema 07/08/2010  . Dyspnea 07/08/2010  . CARDIAC ARREST 04/04/2010  . CONDUCTIVE HEARING LOSS OF COMBINED TYPES 10/24/2009  . KNEE PAIN 08/07/2009  . MI 06/07/2009  . DIABETES MELLITUS, TYPE II 06/06/2009  . DYSLIPIDEMIA 06/06/2009  . Anemia 06/06/2009  . Coronary atherosclerosis 06/06/2009  . Asthma vs vcd 06/06/2009  . DIVERTICULAR DISEASE 06/06/2009  . Chronic kidney disease, stage 3 ([place]) 06/06/2009  . URINARY RETENTION 06/06/2009  . BENIGN PROSTATIC HYPERTROPHY, HX OF 06/06/2009    Orientation RESPIRATION BLADDER Height & Weight     Self, Time, Situation, Place  O2 Incontinent(Foley Catheter) Weight: 155 lb 10.3 oz (70.6 kg) Height:  6' (182.9 cm)  BEHAVIORAL SYMPTOMS/MOOD NEUROLOGICAL BOWEL NUTRITION STATUS      Continent Diet(See DC summary)  AMBULATORY STATUS COMMUNICATION OF NEEDS Skin   Extensive Assist Verbally Normal                       Personal Care Assistance Level of Assistance  Bathing, Feeding, Dressing Bathing  Assistance: Limited assistance Feeding assistance: Independent Dressing Assistance: Limited assistance     Functional Limitations Info  Sight, Hearing, Speech Sight Info: Adequate Hearing Info: Adequate Speech Info: Adequate    SPECIAL CARE FACTORS FREQUENCY  PT (By  licensed PT), OT (By licensed OT)     PT Frequency: 5x/week OT Frequency: 5x/week            Contractures Contractures Info: Not present    Additional Factors Info  Code Status, Allergies Code Status Info: DNR Allergies Info: NKA           Current Medications (12/04/2016):  This is the current hospital active medication list Current Facility-Administered Medications  Medication Dose Route Frequency Provider Last Rate Last Dose  . 0.9 %  sodium chloride infusion  250 mL Intravenous PRN Parrett, Tammy S, NP      . 0.9 %  sodium chloride infusion   Intravenous Continuous Parrett, Tammy S, NP 10 mL/hr at 11/30/16 0600    . acetaminophen (TYLENOL) tablet 650 mg  650 mg Oral Q6H PRN Parrett, Tammy S, NP       Or  . acetaminophen (TYLENOL) suppository 650 mg  650 mg Rectal Q6H PRN Parrett, Tammy S, NP      . albuterol (PROVENTIL) (2.5 MG/3ML) 0.083% nebulizer solution 2.5 mg  2.5 mg Nebulization Q2H PRN Parrett, Tammy S, NP   2.5 mg at 11/29/16 0443  . ALPRAZolam Duanne Moron) tablet 0.25 mg  0.25 mg Oral QHS PRN Parrett, Tammy S, NP   0.25 mg at 12/03/16 2155  . arformoterol (BROVANA) nebulizer solution 15 mcg  15 mcg Nebulization BID Parrett, Tammy S, NP   15 mcg at 12/04/16 0805  . budesonide (PULMICORT) nebulizer solution 0.5 mg  0.5 mg Nebulization BID Parrett, Tammy S, NP   0.5 mg at 12/04/16 0805  . docusate sodium (COLACE) capsule 100 mg  100 mg Oral BID PRN Javier Glazier, MD   100 mg at 12/03/16 2209  . fluticasone (FLONASE) 50 MCG/ACT nasal spray 1 spray  1 spray Each Nare Daily Parrett, Tammy S, NP   1 spray at 12/04/16 0946  . guaiFENesin (MUCINEX) 12 hr tablet 600 mg  600 mg Oral BID Parrett, Tammy S, NP   600 mg at 12/04/16 0939  . magnesium hydroxide (MILK OF MAGNESIA) suspension 15 mL  15 mL Oral Daily PRN Ollis, Brandi L, NP      . metoprolol tartrate (LOPRESSOR) tablet 12.5 mg  12.5 mg Oral BID Parrett, Tammy S, NP   12.5 mg at 12/04/16 0943  . ondansetron (ZOFRAN)  tablet 4 mg  4 mg Oral Q6H PRN Parrett, Tammy S, NP       Or  . ondansetron (ZOFRAN) injection 4 mg  4 mg Intravenous Q6H PRN Parrett, Tammy S, NP      . pantoprazole (PROTONIX) EC tablet 40 mg  40 mg Oral Daily Parrett, Tammy S, NP   40 mg at 12/04/16 0946  . rosuvastatin (CRESTOR) tablet 5 mg  5 mg Oral Daily Parrett, Tammy S, NP   5 mg at 12/04/16 0944  . senna-docusate (Senokot-S) tablet 1 tablet  1 tablet Oral QHS PRN Parrett, Tammy S, NP   1 tablet at 12/04/16 0940  . sodium chloride flush (NS) 0.9 % injection 3 mL  3 mL Intravenous Q12H Parrett, Tammy S, NP   3 mL at 12/04/16 0948  . sodium chloride flush (NS) 0.9 % injection 3 mL  3 mL Intravenous PRN Parrett,  Tammy S, NP   3 mL at 12/01/16 1049  . vitamin B-12 (CYANOCOBALAMIN) tablet 1,000 mcg  1,000 mcg Oral Daily Jonetta Osgood, MD   1,000 mcg at 12/04/16 0946  . vitamin C (ASCORBIC ACID) tablet 250 mg  250 mg Oral BID Parrett, Tammy S, NP   250 mg at 12/04/16 0945     Discharge Medications: Please see discharge summary for a list of discharge medications.  Relevant Imaging Results:  Relevant Lab Results:   Additional Information ssn: [id number]  Servando Snare, LCSW

## 2016-12-05 ENCOUNTER — Encounter: Payer: Self-pay | Admitting: Cardiology

## 2016-12-05 ENCOUNTER — Telehealth: Payer: Self-pay | Admitting: Internal Medicine

## 2016-12-05 DIAGNOSIS — C9000 Multiple myeloma not having achieved remission: Secondary | ICD-10-CM

## 2016-12-05 MED ORDER — ALBUTEROL SULFATE (2.5 MG/3ML) 0.083% IN NEBU: 2.5000 mg | INHALATION_SOLUTION | RESPIRATORY_TRACT | 0 refills | Status: AC | PRN

## 2016-12-05 MED ORDER — CYANOCOBALAMIN 1000 MCG PO TABS: 1000.0000 ug | ORAL_TABLET | Freq: Every day | ORAL | Status: DC

## 2016-12-05 MED ORDER — ALPRAZOLAM 0.25 MG PO TABS: 0.2500 mg | ORAL_TABLET | Freq: Every evening | ORAL | 0 refills | Status: DC | PRN

## 2016-12-05 NOTE — Discharge Summary (Signed)
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 Sex: male Date of Birth: Oct 27, 1925 MRN: [id number]. Admitting Physician: Javier Glazier, MD OXB:[id number], Denton Ar, MD  Admit Date: 11/27/2016 Discharge date: 12/06/2016  Recommendations for Outpatient Follow-up:  1. Follow up with PCP in 1-2 weeks 2. Please obtain BMP/CBC in one week 3. Please ensure follow up with oncology 4. Consider palliative care follow up at SNF 5. Remove Foley catheter in the next 1 week  Admitted From:  Home  Disposition: SNF   Home Health: No  Equipment/Devices: None  Discharge Condition: Stable  CODE STATUS: DNR  Diet recommendation:  Heart Healthy  Brief Summary: See H&P, Labs, Consult and Test reports for all details in brief, Patient is a 83 y.o. Male with past medical history of severe COPD, on home O2, chronic iron deficiency anemia, recent hospitalizations for COPD exacerbation/pneumonia, admitted for fever and weakness. The patient was admitted by pulmonary critical care to the ICU, he was given steroids, bronchodilators. He was found to have pancytopenia. Plans are to pursue any bone marrow biopsy today. See below for further details.   Brief Hospital Course: Acute on chronic hypoxic respiratory failure: Multifactorial-secondary to probable viral bronchitis causing COPD exacerbation, anemia, and debility/frailty. Managed with steroids, bronchodilators with some improvement. He is not wheezing on exam. After extensive discussion with patient, family-given numerous recent hospitalizations-both patient and family are agreeable for SNF for subacute rehabilitation. Continue oxygen on discharge, continue bronchodilators.  COPD exacerbation: Better-no wheezing, moving air well-completed a short course of steroids, not on antibiotics. Continue bronchodilators, and pulmonary hygiene. At this time the main issue is significant amount of debility and deconditioning-leading to frailty, after  extensive discussion, family and patient are agreeable to go to SNF. I even recommended to the patient's daughter yesterday that the family could consider transitioning to hospice care/palliative care at some point in the future if he decompensates.  Pancytopenia: Has had worsening pancytopenia over the past few months, per hematology patient has a thalassemia trait, MGUS-but worsening pancytopenia felt to be probably due to underlying myelodysplastic disorder. Underwent bone marrow biopsy on 11/7, still awaiting results. It is felt that his main issue at this time is significant amount of frailty/debility-and even if he has significant worrisome findings on bone marrow biopsy-it will really not change management in the short-term. He will need follow-up with hematology in the outpatient setting. CBC has been relatively stable over the past few days, follow CBC periodically.  History of chronic diastolic heart failure: Compensated, continue metoprolol.   History of CAD: Currently without chest pain, continue  statin and metoprolol. Antiplatelets on hold due to anemia and thrombocytopenia.   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  Dyslipidemia: Continue statin  Anxiety: Currently appears to be stable, continue as needed Xanax.  GERD: Continue PPI  Debility/frailty/failure to thrive syndrome: 83 year old male with advanced COPD on home O2-with numerous recent hospitalizations-with possible myelodysplastic syndrome-(awaiting bone marrow biopsy results)-admitted again with worsening shortness of breath, felt to be viral bronchitis causing COPD exacerbation. He is incredibly frail, and chronically sick appearing- although anemia may be playing a role in worsening shortness of breath, I think that advanced age/debility and severe COPD are also contributing significantly to his overall poor health. Is anything he has  significant stress worrisome findings on bone marrow biopsy, given his overall health/frailty-it will not change any short-term management, glucose he clearly is not a candidate at this time  for aggressive care. DO NOT RESUSCITATE is in place, family is aware of for long-term prognoses. He will probably require palliative care follow-up at SNF. If in the future, he decompensates or continues to have recurrent hospitalizations in spite of maximal care for support, it may be prudent to engage palliative care services as well.  Note-Foley catheter was inserted during this hospital stay-patient refused to have the Foley catheter removed, and he wants to keep it in mostly for comfort-for at least another week or so. He is aware of the risks of infection and is accepting it.  Procedures/Studies: None  Discharge Diagnoses:  Active Problems:   Acute respiratory failure with hypoxemia (HCC)   Cough   Pancytopenia (HCC)   Discharge Instructions:  Activity:  As tolerated with Full fall precautions use walker/cane & assistance as needed   Discharge Instructions    Call MD for:  difficulty breathing, headache or visual disturbances   Complete by:  As directed    Diet - low sodium heart healthy   Complete by:  As directed    Increase activity slowly   Complete by:  As directed      Allergies as of 12/05/2016   No Known Allergies     Medication List    STOP taking these medications   clopidogrel 75 MG tablet Commonly known as:  PLAVIX   predniSONE 20 MG tablet Commonly known as:  DELTASONE     TAKE these medications   albuterol (2.5 MG/3ML) 0.083% nebulizer solution Commonly known as:  PROVENTIL Take 3 mLs (2.5 mg total) every 2 (two) hours as needed by nebulization for wheezing or shortness of breath. What changed:  when to take this   ALPRAZolam 0.25 MG tablet Commonly known as:  XANAX Take 1 tablet (0.25 mg total) at bedtime as needed by mouth for sleep.   arformoterol 15  MCG/2ML Nebu Commonly known as:  BROVANA Take 15 mcg by nebulization 2 (two) times daily.   ascorbic acid 250 MG tablet Commonly known as:  VITAMIN C Take 1 tablet (250 mg total) by mouth 2 (two) times daily.   aspirin EC 81 MG tablet Take 1 tablet (81 mg total) by mouth at bedtime.   bisacodyl 5 MG EC tablet Commonly known as:  DULCOLAX Take 5 mg by mouth daily as needed for moderate constipation.   budesonide 0.5 MG/2ML nebulizer solution Commonly known as:  PULMICORT Take 2 mLs (0.5 mg total) by nebulization 2 (two) times daily.   cyanocobalamin 1000 MCG tablet Take 1 tablet (1,000 mcg total) daily by mouth.   ferrous sulfate 325 (65 FE) MG tablet Take 1 tablet (325 mg total) by mouth 2 (two) times daily with a meal.   fluticasone 50 MCG/ACT nasal spray Commonly known as:  FLONASE Place 1 spray into both nostrils daily.   metoprolol tartrate 25 MG tablet Commonly known as:  LOPRESSOR Take 12.5 mg by mouth 2 (two) times daily. Reported on 06/06/2015   MUCINEX 600 MG 12 hr tablet Generic drug:  guaiFENesin Take 600 mg by mouth 2 (two) times daily as needed for cough or to loosen phlegm.   nitroGLYCERIN 0.4 MG SL tablet Commonly known as:  NITROSTAT Place 1 tablet (0.4 mg total) under the tongue every 5 (five) minutes as needed for chest pain. UP TO 3 DOSES THEN CALL EMS   OXYGEN Inhale 2 L into the lungs at bedtime.   pantoprazole 40 MG tablet Commonly known as:  PROTONIX Take 1 tablet (40  mg total) by mouth daily.   rosuvastatin 5 MG tablet Commonly known as:  CRESTOR Take 1 tablet (5 mg total) by mouth daily at 6 PM. What changed:  when to take this   sodium chloride 0.65 % Soln nasal spray Commonly known as:  OCEAN Place 2 sprays into both nostrils 2 (two) times daily.      Follow-up Information    [name], MD. Schedule an appointment as soon as possible for a visit in 2 week(s).   Specialty:  Internal Medicine Contact information: [address] [place] [address] [phone]        Ladell Pier, MD Follow up on 12/09/2016.   Specialty:  Oncology Why:  appointment at 2:45 pm-please keep  appointment Contact information: [place] [address] [phone]          No Known Allergies  Consultations:   pulmonary/intensive care   Other Procedures/Studies: Dg Chest 2 View  Result Date: 11/30/2016 CLINICAL DATA:  Acute on chronic respiratory failure with hypercapnia. Ex-smoker. EXAM: CHEST  2 VIEW COMPARISON:  Yesterday. FINDINGS: Normal sized heart. Tortuous aorta. Stable left subclavian pacemaker leads. Stable hyperexpansion of the lungs. Mild diffuse interstitial prominence in the right lung with mild progression. This includes Kerley lines in the lower lung zone. Clear left lung. Small amount of pleural fluid at the posterior lung bases on the lateral view. Mild bilateral shoulder degenerative changes. IMPRESSION: 1. Mildly increased interstitial lung disease on the right with some interstitial pulmonary edema and probable underlying interstitial pneumonitis. 2. Small right pleural effusion. 3. Stable changes of COPD. Electronically Signed   By: Claudie Revering M.D.   On: 11/30/2016 16:13   X-ray Chest Pa And Lateral  Result Date: 11/27/2016 CLINICAL DATA:  Increased weakness, fever and dyspnea over the past several days. EXAM: CHEST  2 VIEW COMPARISON:  11/10/2016. FINDINGS: Heart is within normal limits. Tortuous thoracic aorta with atherosclerosis. Left-sided ICD device projects over the shoulder with leads in the right atrium and right ventricle. Emphysematous hyperinflation of the lungs. Mild increase in vascular congestion is noted since previous study. No effusion or pneumothorax. No acute osseous abnormality. Degenerative changes are noted about the [facility] and glenohumeral joints. IMPRESSION: 1. COPD with slight increase in vascular congestion since prior exam. No  pneumothorax or pneumonic consolidation. 2. Aortic atherosclerosis. Electronically Signed   By: Ashley Royalty M.D.   On: 11/27/2016 20:14   Ct Biopsy  Result Date: 12/03/2016 INDICATION: 83 year old male with a history of anemia EXAM: CT BIOPSY; CT BONE MARROW BIOPSY AND ASPIRATION MEDICATIONS: None. ANESTHESIA/SEDATION: Moderate (conscious) sedation was employed during this procedure. A total of Versed 1.0 mg and Fentanyl 50 mcg was administered intravenously. Moderate Sedation Time: 10 minutes. The patient's level of consciousness and vital signs were monitored continuously by radiology nursing throughout the procedure under my direct supervision. FLUOROSCOPY TIME:  CT COMPLICATIONS: None PROCEDURE: The procedure risks, benefits, and alternatives were explained to the patient. Questions regarding the procedure were encouraged and answered. The patient understands and consents to the procedure. Scout CT of the pelvis was performed for surgical planning purposes. The posterior pelvis was prepped with Betadinein a sterile fashion, and a sterile drape was applied covering the operative field. A sterile gown and sterile gloves were used for the procedure. Local anesthesia was provided with 1% Lidocaine. We targeted the right posterior iliac bone for biopsy. The skin and subcutaneous tissues were infiltrated with 1% lidocaine without epinephrine.  A small stab incision was made with an 11 blade scalpel, and an 11 gauge Murphy needle was advanced with CT guidance to the posterior cortex. Manual forced was used to advance the needle through the posterior cortex and the stylet was removed. A bone marrow aspirate was retrieved and passed to a cytotechnologist in the room. The Murphy needle was then advanced without the stylet for a core biopsy. The core biopsy was retrieved and also passed to a cytotechnologist. Manual pressure was used for hemostasis and a sterile dressing was placed. No complications were encountered no  significant blood loss was encountered. Patient tolerated the procedure well and remained hemodynamically stable throughout. IMPRESSION: Status post CT-guided bone marrow biopsy, with tissue specimen sent to pathology for complete histopathologic analysis Signed, Dulcy Fanny. Earleen Newport, DO [facility] Electronically Signed   By: Corrie Mckusick D.O.   On: 12/03/2016 12:18   Dg Chest Port 1 View  Result Date: 12/02/2016 CLINICAL DATA:  Acute respiratory failure with hypoxia. EXAM: PORTABLE CHEST 1 VIEW COMPARISON:  Radiographs of November 30, 2016. FINDINGS: Stable cardiomediastinal silhouette. Atherosclerosis of thoracic aorta is noted. Left-sided pacemaker is unchanged in position. Left lung is clear. Stable interstitial prominence is noted in the right lung which may represent scarring, although superimposed acute edema or inflammation cannot be excluded. Bony thorax is unremarkable. IMPRESSION: Aortic atherosclerosis. Stable interstitial prominence is noted in the right lung which may represent scarring, although superimposed acute edema or inflammation cannot be excluded. Electronically Signed   By: Marijo Conception, M.D.   On: 12/02/2016 10:16   Dg Chest Port 1 View  Result Date: 11/29/2016 CLINICAL DATA:  Acute respiratory failure with hypoxemia. Ex-smoker. EXAM: PORTABLE CHEST 1 VIEW COMPARISON:  11/27/2016 and 11/10/2016. FINDINGS: Stable left subclavian pacemaker leads. Normal sized heart. The aorta remains diffusely enlarged and tortuous a partially calcified. Stable hyperexpansion of the lungs. Increased interstitial markings in the right mid and upper lung zone compared to 11/10/2016. Diffuse osteopenia. Superior migration of both humeral heads with bony remodeling. IMPRESSION: 1. Mild interstitial lung disease in the right mid and upper lung zones, new since 11/10/2016. This has an appearance most compatible with interstitial pneumonitis.  Asymmetrical interstitial pulmonary edema is less likely. 2. Stable changes of COPD. 3. Stable diffusely enlarged, tortuous and partially calcified thoracic aorta. 4. Bilateral large, chronic rotator cuff tears. Electronically Signed   By: Claudie Revering M.D.   On: 11/29/2016 07:52   Dg Chest Port 1 View  Result Date: 11/10/2016 CLINICAL DATA:  Hypoxia and anemia.  COPD exacerbation. EXAM: PORTABLE CHEST 1 VIEW COMPARISON:  10/29/2016 FINDINGS: Emphysematous hyperinflation of the lungs are again noted with minimal scarring at the left lung base. No pneumonic consolidation or overt pulmonary edema. No pneumothorax. No pleural effusion. Heart size is within normal limits with right atrial pacer and right ventricular defibrillator leads in place. Left-sided ICD device projects over the shoulder. Tortuous aorta with aortic atherosclerosis, stable in appearance without aneurysm. Pulmonary vasculature is within normal limits. High-riding humeral heads again noted which may reflect chronic rotator cuff tears. IMPRESSION: 1. 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 2. Stable aortic atherosclerosis. 3. No acute pneumonic consolidation or CHF. Electronically Signed   By: Ashley Royalty M.D.   On: 11/10/2016 17:59   Ct Bone Marrow Biopsy & Aspiration  Result Date: 12/03/2016 INDICATION: 83 year old male with a history of anemia EXAM: CT BIOPSY; CT BONE MARROW BIOPSY AND ASPIRATION MEDICATIONS: None. ANESTHESIA/SEDATION:  Moderate (conscious) sedation was employed during this procedure. A total of Versed 1.0 mg and Fentanyl 50 mcg was administered intravenously. Moderate Sedation Time: 10 minutes. The patient's level of consciousness and vital signs were monitored continuously by radiology nursing throughout the procedure under my direct supervision. FLUOROSCOPY TIME:  CT COMPLICATIONS: None PROCEDURE: The procedure risks, benefits, and alternatives were explained to the patient. Questions  regarding the procedure were encouraged and answered. The patient understands and consents to the procedure. Scout CT of the pelvis was performed for surgical planning purposes. The posterior pelvis was prepped with Betadinein a sterile fashion, and a sterile drape was applied covering the operative field. A sterile gown and sterile gloves were used for the procedure. Local anesthesia was provided with 1% Lidocaine. We targeted the right posterior iliac bone for biopsy. The skin and subcutaneous tissues were infiltrated with 1% lidocaine without epinephrine. A small stab incision was made with an 11 blade scalpel, and an 11 gauge Murphy needle was advanced with CT guidance to the posterior cortex. Manual forced was used to advance the needle through the posterior cortex and the stylet was removed. A bone marrow aspirate was retrieved and passed to a cytotechnologist in the room. The Murphy needle was then advanced without the stylet for a core biopsy. The core biopsy was retrieved and also passed to a cytotechnologist. Manual pressure was used for hemostasis and a sterile dressing was placed. No complications were encountered no significant blood loss was encountered. Patient tolerated the procedure well and remained hemodynamically stable throughout. IMPRESSION: Status post CT-guided bone marrow biopsy, with tissue specimen sent to pathology for complete histopathologic analysis Signed, Dulcy Fanny. Earleen Newport, DO [facility] Electronically Signed   By: Corrie Mckusick D.O.   On: 12/03/2016 12:18     TODAY-DAY OF DISCHARGE:  Subjective:   [name] [name] today has no headache,no chest abdominal pain,no new weakness tingling or numbness, feels much better wants to go home today.   Objective:   Blood pressure 137/84, pulse 86, temperature 97.9 F (36.6 C), temperature source Oral, resp. rate 20, height 6' (1.829 m), weight 70.6 kg (155 lb 10.3 oz), SpO2 99  %.  Intake/Output Summary (Last 24 hours) at 12/05/2016 0932 Last data filed at 12/05/2016 0441 Gross per 24 hour  Intake 3 ml  Output 550 ml  Net -547 ml   Filed Weights   12/02/16 0455 12/03/16 0350 12/04/16 0500  Weight: 71.1 kg (156 lb 12 oz) 70.8 kg (156 lb 1.4 oz) 70.6 kg (155 lb 10.3 oz)    Exam: Awake Alert, Oriented *3, No new F.N deficits, Normal affect [place].AT,PERRAL Supple Neck,No JVD, No cervical lymphadenopathy appriciated.  Symmetrical Chest wall movement, Good air movement bilaterally, CTAB RRR,No Gallops,Rubs or new Murmurs, No Parasternal Heave +ve B.Sounds, Abd Soft, Non tender, No organomegaly appriciated, No rebound -guarding or rigidity. No Cyanosis, Clubbing or edema, No new Rash or bruise   PERTINENT RADIOLOGIC STUDIES: Dg Chest 2 View  Result Date: 11/30/2016 CLINICAL DATA:  Acute on chronic respiratory failure with hypercapnia. Ex-smoker. EXAM: CHEST  2 VIEW COMPARISON:  Yesterday. FINDINGS: Normal sized heart. Tortuous aorta. Stable left subclavian pacemaker leads. Stable hyperexpansion of the lungs. Mild diffuse interstitial prominence in the right lung with mild progression. This includes Kerley lines in the lower lung zone. Clear left lung. Small amount of pleural fluid at the posterior lung bases on the lateral view. Mild bilateral shoulder degenerative changes. IMPRESSION: 1. Mildly increased interstitial lung  disease on the right with some interstitial pulmonary edema and probable underlying interstitial pneumonitis. 2. Small right pleural effusion. 3. Stable changes of COPD. Electronically Signed   By: Claudie Revering M.D.   On: 11/30/2016 16:13   X-ray Chest Pa And Lateral  Result Date: 11/27/2016 CLINICAL DATA:  Increased weakness, fever and dyspnea over the past several days. EXAM: CHEST  2 VIEW COMPARISON:  11/10/2016. FINDINGS: Heart is within normal limits. Tortuous thoracic aorta with atherosclerosis. Left-sided ICD device projects over the shoulder  with leads in the right atrium and right ventricle. Emphysematous hyperinflation of the lungs. Mild increase in vascular congestion is noted since previous study. No effusion or pneumothorax. No acute osseous abnormality. Degenerative changes are noted about the [employer] and glenohumeral joints. IMPRESSION: 1. COPD with slight increase in vascular congestion since prior exam. No pneumothorax or pneumonic consolidation. 2. Aortic atherosclerosis. Electronically Signed   By: Ashley Royalty M.D.   On: 11/27/2016 20:14   Ct Biopsy  Result Date: 12/03/2016 INDICATION: 83 year old male with a history of anemia EXAM: CT BIOPSY; CT BONE MARROW BIOPSY AND ASPIRATION MEDICATIONS: None. ANESTHESIA/SEDATION: Moderate (conscious) sedation was employed during this procedure. A total of Versed 1.0 mg and Fentanyl 50 mcg was administered intravenously. Moderate Sedation Time: 10 minutes. The patient's level of consciousness and vital signs were monitored continuously by radiology nursing throughout the procedure under my direct supervision. FLUOROSCOPY TIME:  CT COMPLICATIONS: None PROCEDURE: The procedure risks, benefits, and alternatives were explained to the patient. Questions regarding the procedure were encouraged and answered. The patient understands and consents to the procedure. Scout CT of the pelvis was performed for surgical planning purposes. The posterior pelvis was prepped with Betadinein a sterile fashion, and a sterile drape was applied covering the operative field. A sterile gown and sterile gloves were used for the procedure. Local anesthesia was provided with 1% Lidocaine. We targeted the right posterior iliac bone for biopsy. The skin and subcutaneous tissues were infiltrated with 1% lidocaine without epinephrine. A small stab incision was made with an 11 blade scalpel, and an 11 gauge Murphy needle was advanced with CT guidance to the posterior cortex. Manual forced was used to advance the needle through the  posterior cortex and the stylet was removed. A bone marrow aspirate was retrieved and passed to a cytotechnologist in the room. The Murphy needle was then advanced without the stylet for a core biopsy. The core biopsy was retrieved and also passed to a cytotechnologist. Manual pressure was used for hemostasis and a sterile dressing was placed. No complications were encountered no significant blood loss was encountered. Patient tolerated the procedure well and remained hemodynamically stable throughout. IMPRESSION: Status post CT-guided bone marrow biopsy, with tissue specimen sent to pathology for complete histopathologic analysis Signed, Dulcy Fanny. Earleen Newport, DO [facility] Electronically Signed   By: Corrie Mckusick D.O.   On: 12/03/2016 12:18   Dg Chest Port 1 View  Result Date: 12/02/2016 CLINICAL DATA:  Acute respiratory failure with hypoxia. EXAM: PORTABLE CHEST 1 VIEW COMPARISON:  Radiographs of November 30, 2016. FINDINGS: Stable cardiomediastinal silhouette. Atherosclerosis of thoracic aorta is noted. Left-sided pacemaker is unchanged in position. Left lung is clear. Stable interstitial prominence is noted in the right lung which may represent scarring, although superimposed acute edema or inflammation cannot be excluded. Bony thorax is unremarkable. IMPRESSION: Aortic atherosclerosis. Stable interstitial prominence is noted in the right lung which may represent scarring, although superimposed acute edema or inflammation  cannot be excluded. Electronically Signed   By: Marijo Conception, M.D.   On: 12/02/2016 10:16   Dg Chest Port 1 View  Result Date: 11/29/2016 CLINICAL DATA:  Acute respiratory failure with hypoxemia. Ex-smoker. EXAM: PORTABLE CHEST 1 VIEW COMPARISON:  11/27/2016 and 11/10/2016. FINDINGS: Stable left subclavian pacemaker leads. Normal sized heart. The aorta remains diffusely enlarged and tortuous a partially calcified. Stable  hyperexpansion of the lungs. Increased interstitial markings in the right mid and upper lung zone compared to 11/10/2016. Diffuse osteopenia. Superior migration of both humeral heads with bony remodeling. IMPRESSION: 1. Mild interstitial lung disease in the right mid and upper lung zones, new since 11/10/2016. This has an appearance most compatible with interstitial pneumonitis. Asymmetrical interstitial pulmonary edema is less likely. 2. Stable changes of COPD. 3. Stable diffusely enlarged, tortuous and partially calcified thoracic aorta. 4. Bilateral large, chronic rotator cuff tears. Electronically Signed   By: Claudie Revering M.D.   On: 11/29/2016 07:52   Dg Chest Port 1 View  Result Date: 11/10/2016 CLINICAL DATA:  Hypoxia and anemia.  COPD exacerbation. EXAM: PORTABLE CHEST 1 VIEW COMPARISON:  10/29/2016 FINDINGS: Emphysematous hyperinflation of the lungs are again noted with minimal scarring at the left lung base. No pneumonic consolidation or overt pulmonary edema. No pneumothorax. No pleural effusion. Heart size is within normal limits with right atrial pacer and right ventricular defibrillator leads in place. Left-sided ICD device projects over the shoulder. Tortuous aorta with aortic atherosclerosis, stable in appearance without aneurysm. Pulmonary vasculature is within normal limits. High-riding humeral heads again noted which may reflect chronic rotator cuff tears. IMPRESSION: 1. 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 2. Stable aortic atherosclerosis. 3. No acute pneumonic consolidation or CHF. Electronically Signed   By: Ashley Royalty M.D.   On: 11/10/2016 17:59   Ct Bone Marrow Biopsy & Aspiration  Result Date: 12/03/2016 INDICATION: 83 year old male with a history of anemia EXAM: CT BIOPSY; CT BONE MARROW BIOPSY AND ASPIRATION MEDICATIONS: None. ANESTHESIA/SEDATION: Moderate (conscious) sedation was employed during this procedure. A total of Versed 1.0 mg and  Fentanyl 50 mcg was administered intravenously. Moderate Sedation Time: 10 minutes. The patient's level of consciousness and vital signs were monitored continuously by radiology nursing throughout the procedure under my direct supervision. FLUOROSCOPY TIME:  CT COMPLICATIONS: None PROCEDURE: The procedure risks, benefits, and alternatives were explained to the patient. Questions regarding the procedure were encouraged and answered. The patient understands and consents to the procedure. Scout CT of the pelvis was performed for surgical planning purposes. The posterior pelvis was prepped with Betadinein a sterile fashion, and a sterile drape was applied covering the operative field. A sterile gown and sterile gloves were used for the procedure. Local anesthesia was provided with 1% Lidocaine. We targeted the right posterior iliac bone for biopsy. The skin and subcutaneous tissues were infiltrated with 1% lidocaine without epinephrine. A small stab incision was made with an 11 blade scalpel, and an 11 gauge Murphy needle was advanced with CT guidance to the posterior cortex. Manual forced was used to advance the needle through the posterior cortex and the stylet was removed. A bone marrow aspirate was retrieved and passed to a cytotechnologist in the room. The Murphy needle was then advanced without the stylet for a core biopsy. The core biopsy was retrieved and also passed to a cytotechnologist. Manual pressure was used for hemostasis and a sterile dressing was placed. No complications were encountered no significant blood loss was encountered. Patient  tolerated the procedure well and remained hemodynamically stable throughout. IMPRESSION: Status post CT-guided bone marrow biopsy, with tissue specimen sent to pathology for complete histopathologic analysis Signed, Dulcy Fanny. Earleen Newport, DO [facility] Radiology Electronically Signed   By: Corrie Mckusick D.O.   On: 12/03/2016  12:18     PERTINENT LAB RESULTS: CBC: Recent Labs    12/03/16 0338 12/04/16 0342  WBC 2.7* 3.1*  HGB 8.0* 8.2*  HCT 25.1* 25.1*  PLT 65* 62*   CMET CMP     Component Value Date/Time   NA 139 12/03/2016 0338   K 4.6 12/03/2016 0338   CL 102 12/03/2016 0338   CO2 28 12/03/2016 0338   GLUCOSE 107 (H) 12/03/2016 0338   BUN 33 (H) 12/03/2016 0338   CREATININE 0.99 12/03/2016 0338   CREATININE 1.48 (H) 06/06/2015 1629   CALCIUM 8.2 (L) 12/03/2016 0338   PROT 5.4 (L) 12/03/2016 0338   ALBUMIN 2.2 (L) 12/03/2016 0338   AST 17 12/03/2016 0338   ALT 12 (L) 12/03/2016 0338   ALKPHOS 39 12/03/2016 0338   BILITOT 0.9 12/03/2016 0338   GFRNONAA >60 12/03/2016 0338   GFRAA >60 12/03/2016 0338    GFR Estimated Creatinine Clearance: 50.5 mL/min (by C-G formula based on SCr of 0.99 mg/dL). No results for input(s): LIPASE, AMYLASE in the last 72 hours. No results for input(s): CKTOTAL, CKMB, CKMBINDEX, TROPONINI in the last 72 hours. Invalid input(s): POCBNP No results for input(s): DDIMER in the last 72 hours. No results for input(s): HGBA1C in the last 72 hours. No results for input(s): CHOL, HDL, LDLCALC, TRIG, CHOLHDL, LDLDIRECT in the last 72 hours. No results for input(s): TSH, T4TOTAL, T3FREE, THYROIDAB in the last 72 hours.  Invalid input(s): FREET3 No results for input(s): VITAMINB12, FOLATE, FERRITIN, TIBC, IRON, RETICCTPCT in the last 72 hours. Coags: No results for input(s): INR in the last 72 hours.  Invalid input(s): PT Microbiology: Recent Results (from the past 240 hour(s))  Culture, blood (Routine X 2) w Reflex to ID Panel     Status: None   Collection Time: 11/27/16  6:36 PM  Result Value Ref Range Status   Specimen Description BLOOD RIGHT ARM  Final   Special Requests   Final    BOTTLES DRAWN AEROBIC ONLY Blood Culture adequate volume   Culture   Final    NO GROWTH 5 DAYS Performed at [facility], [address] [place], [place] [address]     Report Status 12/02/2016 FINAL  Final  Culture, blood (Routine X 2) w Reflex to ID Panel     Status: None   Collection Time: 11/27/16  6:36 PM  Result Value Ref Range Status   Specimen Description BLOOD RIGHT HAND  Final   Special Requests IN PEDIATRIC BOTTLE Blood Culture adequate volume  Final   Culture   Final    NO GROWTH 5 DAYS Performed at [facility], [place] [address], [place], [place] [address]    Report Status 12/02/2016 FINAL  Final  Urine culture     Status: None   Collection Time: 11/27/16  9:21 PM  Result Value Ref Range Status   Specimen Description URINE, CATHETERIZED  Final   Special Requests NONE  Final   Culture   Final    NO GROWTH Performed at [facility], [place] [address], [place], [place] [address]    Report Status 11/29/2016 FINAL  Final  Respiratory Panel by PCR  Status: None   Collection Time: 11/28/16  1:35 PM  Result Value Ref Range Status   Adenovirus NOT DETECTED NOT DETECTED Final   Coronavirus 229E NOT DETECTED NOT DETECTED Final   Coronavirus HKU1 NOT DETECTED NOT DETECTED Final   Coronavirus NL63 NOT DETECTED NOT DETECTED Final   Coronavirus OC43 NOT DETECTED NOT DETECTED Final   Metapneumovirus NOT DETECTED NOT DETECTED Final   Rhinovirus / Enterovirus NOT DETECTED NOT DETECTED Final   Influenza A NOT DETECTED NOT DETECTED Final   Influenza B NOT DETECTED NOT DETECTED Final   Parainfluenza Virus 1 NOT DETECTED NOT DETECTED Final   Parainfluenza Virus 2 NOT DETECTED NOT DETECTED Final   Parainfluenza Virus 3 NOT DETECTED NOT DETECTED Final   Parainfluenza Virus 4 NOT DETECTED NOT DETECTED Final   Respiratory Syncytial Virus NOT DETECTED NOT DETECTED Final   Bordetella pertussis NOT DETECTED NOT DETECTED Final   Chlamydophila pneumoniae NOT DETECTED NOT DETECTED Final   Mycoplasma pneumoniae NOT DETECTED NOT DETECTED Final    Comment: Performed at [facility], [place] [address], [place], [place] [address]    FURTHER  DISCHARGE INSTRUCTIONS:  Get Medicines reviewed and adjusted: Please take all your medications with you for your next visit with your Primary MD  Laboratory/radiological data: Please request your Primary MD to go over all hospital tests and procedure/radiological results at the follow up, please ask your Primary MD to get all Hospital records sent to his/her office.  In some cases, they will be blood work, cultures and biopsy results pending at the time of your discharge. Please request that your primary care M.D. goes through all the records of your hospital data and follows up on these results.  Also Note the following: If you experience worsening of your admission symptoms, develop shortness of breath, life threatening emergency, suicidal or homicidal thoughts you must seek medical attention immediately by calling 911 or calling your MD immediately  if symptoms less severe.  You must read complete instructions/literature along with all the possible adverse reactions/side effects for all the Medicines you take and that have been prescribed to you. Take any new Medicines after you have completely understood and accpet all the possible adverse reactions/side effects.   Do not drive when taking Pain medications or sleeping medications (Benzodaizepines)  Do not take more than prescribed Pain, Sleep and Anxiety Medications. It is not advisable to combine anxiety,sleep and pain medications without talking with your primary care practitioner  Special Instructions: If you have smoked or chewed Tobacco  in the last 2 yrs please stop smoking, stop any regular Alcohol  and or any Recreational drug use.  Wear Seat belts while driving.  Please note: You were cared for by a hospitalist during your hospital stay. Once you are discharged, your primary care physician will handle any further medical issues. Please note that NO REFILLS for any discharge medications will be authorized once you are discharged,  as it is imperative that you return to your primary care physician (or establish a relationship with a primary care physician if you do not have one) for your post hospital discharge needs so that they can reassess your need for medications and monitor your lab values.  Total Time spent coordinating discharge including counseling, education and face to face time equals 45 minutes.  SignedOren Binet 12/05/2016 9:32 AM

## 2016-12-05 NOTE — Telephone Encounter (Signed)
Called pt's daughter and advised message from the provider. She verbalized understanding. Nothing further is needed.  Appt made with TP on 11/29 at 4:30pm. FYI MR

## 2016-12-05 NOTE — Progress Notes (Signed)
  D/w dR [facility] - [facility] can sign off. Patient not seen./  I have sent message to pulmonary office to schedule followup  Dr. Brand Males, M.D., [facility].C.P Pulmonary and Critical Care Medicine Staff Physician [place] [facility] Pager: [phone], If no answer or between  15:00h - 7:00h: call [phone]  12/05/2016 11:44 AM   Future Appointments  Date Time Provider [place]  12/09/2016  2:45 PM Owens Shark, NP [facility] None  12/24/2016  2:05 PM [facility] DEVICE REMOTES [facility]  02/06/2017  2:45 PM Josue Hector, MD [facility]

## 2016-12-05 NOTE — Telephone Encounter (Signed)
Triage please give a followup next 3-4 weeks with APP for this patient with copd. Tried calling scheduling but couldnot connect  Dr. Brand Males, M.D., [facility].C.P Pulmonary and Critical Care Medicine Staff Physician [place] [facility] Pager: [phone], If no answer or between  15:00h - 7:00h: call [phone]  12/05/2016 11:46 AM

## 2016-12-05 NOTE — Progress Notes (Signed)
No ICM remote transmission received for 12/01/2016 and next ICM transmission scheduled for 12/24/2016.

## 2016-12-05 NOTE — Telephone Encounter (Signed)
thanks

## 2016-12-05 NOTE — Progress Notes (Signed)
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 Sex: male Date of Birth: 15-Apr-1925 Admit Date: 11/27/2016 Admitting Physician Javier Glazier, MD ZOX:[id number], Denton Ar, MD  Brief Narrative: Patient is a 83 y.o. Male with past medical history of severe COPD, on home O2, chronic iron deficiency anemia, recent hospitalizations for COPD exacerbation/pneumonia, admitted for fever and weakness. The patient was admitted by pulmonary critical care to the ICU, he was given steroids, bronchodilators. He was found to have pancytopenia. Plans are to pursue any bone marrow biopsy today. See below for further details.  Subjective: Lying comfortably in bed-no major issues overnight.  Assessment/Plan: Acute on chronic hypoxic respiratory failure: Multifactorial-secondary to probable viral bronchitis causing COPD exacerbation, anemia, and debility/frailty. After extensive discussion with patient, family-given numerous recent hospitalizations-both patient and family are agreeable for SNF for subacute rehabilitation. Continue oxygen on discharge, continue bronchodilators. Lungs are clear he is not actively wheezing. Awaiting SNF bed.  COPD exacerbation: Better-no wheezing, moving air well-completed a short course of steroids, not on antibiotics. Continue bronchodilators, and pulmonary hygiene. At this time the main issue is significant amount of debility and deconditioning-leading to frailty, after extensive discussion, family and patient are agreeable to go to SNF. I even recommended to the patient's daughter yesterday that the family could consider transitioning to hospice care/palliative care at some point in the future if he decompensates.  Pancytopenia: Has had worsening pancytopenia over the past few months, per hematology patient has a thalassemia trait, MGUS-but worsening pancytopenia felt to be probably due to her underlying myelodysplastic disorder.  Underwent bone marrow biopsy on 11/7, still awaiting results. It is felt that his main issue at this time is significant amount of frailty/debility-and even if he has significant stress worrisome findings on bone marrow biopsy-it will really not change management in the short-term. He will need follow-up with hematology in the outpatient setting. CBC has been relatively stable over the past few days, follow CBC periodically.  History of chronic diastolic heart failure: Compensated, continue metoprolol.   History of CAD: Currently without chest pain, continue  statin and metoprolol. 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  Dyslipidemia: Continue statin  Anxiety: Currently appears to be stable, continue as needed Xanax.  GERD: Continue PPI  Debility/frailty/failure to thrive syndrome: 83 year old male with advanced COPD on home O2-with numerous recent hospitalizations-with possible myelodysplastic syndrome-(awaiting bone marrow biopsy results)-admitted again with worsening shortness of breath, felt to be viral bronchitis causing COPD exacerbation. He is incredibly frail, and chronically sick appearing- although anemia may be playing a role in worsening shortness of breath, I think that advanced age/debility and severe COPD are also contributing significantly to his overall poor health. Is anything he has significant stress worrisome findings on bone marrow biopsy, given his overall health/frailty-it will not change any short-term management, glucose he clearly is not a candidate at this time for aggressive care. DO NOT RESUSCITATE is in place, family is aware of for long-term prognoses. He will probably require palliative care follow-up at SNF. If in the future, he decompensates or continues to have recurrent hospitalizations in spite of maximal cares for support, it may be prudent to engage  palliative care services as well.   DVT  Prophylaxis: SCD's  Code Status: DNR  Family Communication: Spoke with patient's daughter over the phone  Disposition Plan: Remain inpatient-SNF soon.  Antimicrobial agents: Anti-infectives (From admission, onward)   Start     Dose/Rate Route Frequency Ordered Stop   11/28/16 2000  vancomycin (VANCOCIN) IVPB 1000 mg/200 mL premix  Status:  Discontinued     1,000 mg 200 mL/hr over 60 Minutes Intravenous Every 24 hours 11/27/16 1741 11/27/16 2016   11/28/16 2000  vancomycin (VANCOCIN) IVPB 750 mg/150 ml premix  Status:  Discontinued     750 mg 150 mL/hr over 60 Minutes Intravenous Every 24 hours 11/27/16 2016 11/28/16 1049   11/27/16 1830  vancomycin (VANCOCIN) 1,500 mg in sodium chloride 0.9 % 500 mL IVPB     1,500 mg 250 mL/hr over 120 Minutes Intravenous  Once 11/27/16 1741 11/27/16 2129   11/27/16 1800  ceFEPIme (MAXIPIME) 2 g in dextrose 5 % 50 mL IVPB  Status:  Discontinued     2 g 100 mL/hr over 30 Minutes Intravenous Every 24 hours 11/27/16 1712 11/28/16 1131      Procedures: None  CONSULTS:  pulmonary/intensive care and hematology/oncology  Time spent: 25 minutes-Greater than 50% of this time was spent in counseling, explanation of diagnosis, planning of further management, and coordination of care.  MEDICATIONS: Scheduled Meds: . arformoterol  15 mcg Nebulization BID  . budesonide  0.5 mg Nebulization BID  . fluticasone  1 spray Each Nare Daily  . guaiFENesin  600 mg Oral BID  . metoprolol tartrate  12.5 mg Oral BID  . pantoprazole  40 mg Oral Daily  . rosuvastatin  5 mg Oral Daily  . sodium chloride flush  3 mL Intravenous Q12H  . vitamin B-12  1,000 mcg Oral Daily  . ascorbic acid  250 mg Oral BID   Continuous Infusions: . sodium chloride    . sodium chloride 10 mL/hr at 11/30/16 0600   PRN Meds:.sodium chloride, acetaminophen **OR** acetaminophen, albuterol, ALPRAZolam, docusate sodium, magnesium hydroxide,  ondansetron **OR** ondansetron (ZOFRAN) IV, senna-docusate, sodium chloride flush   PHYSICAL EXAM: Vital signs: Vitals:   12/05/16 0441 12/05/16 0627 12/05/16 0730 12/05/16 0732  BP: 137/84     Pulse: 86     Resp: 20     Temp: 97.9 F (36.6 C)     TempSrc: Oral     SpO2: 97% 96% 99% 99%  Weight:      Height:       Filed Weights   12/02/16 0455 12/03/16 0350 12/04/16 0500  Weight: 71.1 kg (156 lb 12 oz) 70.8 kg (156 lb 1.4 oz) 70.6 kg (155 lb 10.3 oz)   Body mass index is 21.11 kg/m.   General appearance :Awake, alert, not in any distress.  Eyes:, pupils equally reactive to light and accomodation,no scleral icterus. HEENT: Atraumatic and Normocephalic Neck: supple, no JVD. Resp:Good air entry bilaterally CVS: S1 S2 regular, no murmurs.  GI: Bowel sounds present, Non tender and not distended with no gaurding, rigidity or rebound. Extremities: B/L Lower Ext shows no edema, both legs are warm to touch Neurology:  speech clear,Non focal, sensation is grossly intact. Psychiatric: Normal judgment and insight. Normal mood. Musculoskeletal:No digital cyanosis Skin:No Rash, warm and dry Wounds:N/A  I have personally reviewed following labs and imaging studies  LABORATORY DATA: CBC: Recent Labs  Lab 11/30/16 0833 12/01/16 0411 12/02/16 0350 12/03/16 0338 12/04/16 0342  WBC 3.2* 3.5* 2.9* 2.7* 3.1*  NEUTROABS 2.5 3.0 2.2 2.0 2.4  HGB  8.2* 8.6* 8.3* 8.0* 8.2*  HCT 25.7* 26.8* 26.1* 25.1* 25.1*  MCV 75.6* 76.1* 76.3* 76.8* 76.3*  PLT 48* 52* 64* 65* 62*    Basic Metabolic Panel: Recent Labs  Lab 11/29/16 0349 11/30/16 0833 12/01/16 0411 12/02/16 0350 12/03/16 0338  NA 139 140 142 140 139  K 4.1 3.7 4.1 4.3 4.6  CL 104 102 103 101 102  CO2 '27 29 30 28 28  '$ GLUCOSE 107* 94 94 84 107*  BUN 31* 43* 38* 35* 33*  CREATININE 1.28* 1.34* 1.04 1.03 0.99  CALCIUM 8.1* 8.5* 8.3* 8.2* 8.2*    GFR: Estimated Creatinine Clearance: 50.5 mL/min (by C-G formula based on  SCr of 0.99 mg/dL).  Liver Function Tests: Recent Labs  Lab 11/30/16 0833 12/01/16 0411 12/02/16 0350 12/03/16 0338  AST 15 13* 12* 17  ALT 15* 16* 15* 12*  ALKPHOS 42 42 39 39  BILITOT 0.4 0.6 0.7 0.9  PROT 5.6* 5.6* 5.5* 5.4*  ALBUMIN 2.2* 2.3* 2.2* 2.2*   No results for input(s): LIPASE, AMYLASE in the last 168 hours. No results for input(s): AMMONIA in the last 168 hours.  Coagulation Profile: No results for input(s): INR, PROTIME in the last 168 hours.  Cardiac Enzymes: No results for input(s): CKTOTAL, CKMB, CKMBINDEX, TROPONINI in the last 168 hours.  BNP (last 3 results) No results for input(s): PROBNP in the last 8760 hours.  HbA1C: No results for input(s): HGBA1C in the last 72 hours.  CBG: Recent Labs  Lab 11/30/16 0747 12/01/16 0752 12/02/16 0740 12/03/16 0738 12/04/16 0755  GLUCAP 100* 97 95 90 121*    Lipid Profile: No results for input(s): CHOL, HDL, LDLCALC, TRIG, CHOLHDL, LDLDIRECT in the last 72 hours.  Thyroid Function Tests: No results for input(s): TSH, T4TOTAL, FREET4, T3FREE, THYROIDAB in the last 72 hours.  Anemia Panel: No results for input(s): VITAMINB12, FOLATE, FERRITIN, TIBC, IRON, RETICCTPCT in the last 72 hours.  Urine analysis:    Component Value Date/Time   COLORURINE YELLOW 11/27/2016 2121   APPEARANCEUR CLOUDY (A) 11/27/2016 2121   LABSPEC 1.018 11/27/2016 2121   LABSPEC 1.025 05/01/2010 1454   PHURINE 5.0 11/27/2016 2121   GLUCOSEU NEGATIVE 11/27/2016 2121   HGBUR NEGATIVE 11/27/2016 2121   BILIRUBINUR NEGATIVE 11/27/2016 2121   BILIRUBINUR Negative 05/01/2010 1454   KETONESUR 5 (A) 11/27/2016 2121   PROTEINUR 30 (A) 11/27/2016 2121   UROBILINOGEN 0.2 06/07/2014 2018   NITRITE NEGATIVE 11/27/2016 2121   LEUKOCYTESUR NEGATIVE 11/27/2016 2121   LEUKOCYTESUR Negative 05/01/2010 1454    Sepsis Labs: Lactic Acid, Venous    Component Value Date/Time   LATICACIDVEN 0.9 11/10/2016 2033    MICROBIOLOGY: Recent  Results (from the past 240 hour(s))  Culture, blood (Routine X 2) w Reflex to ID Panel     Status: None   Collection Time: 11/27/16  6:36 PM  Result Value Ref Range Status   Specimen Description BLOOD RIGHT ARM  Final   Special Requests   Final    BOTTLES DRAWN AEROBIC ONLY Blood Culture adequate volume   Culture   Final    NO GROWTH 5 DAYS Performed at [facility], [place] [address] [place], [place] [address]    Report Status 12/02/2016 FINAL  Final  Culture, blood (Routine X 2) w Reflex to ID Panel     Status: None   Collection Time: 11/27/16  6:36 PM  Result Value Ref Range Status   Specimen Description BLOOD RIGHT HAND  Final  Special Requests IN PEDIATRIC BOTTLE Blood Culture adequate volume  Final   Culture   Final    NO GROWTH 5 DAYS Performed at [facility], [place] [address], [place], [place] [address]    Report Status 12/02/2016 FINAL  Final  Urine culture     Status: None   Collection Time: 11/27/16  9:21 PM  Result Value Ref Range Status   Specimen Description URINE, CATHETERIZED  Final   Special Requests NONE  Final   Culture   Final    NO GROWTH Performed at [facility], [place] [address] [place], [place] [address]    Report Status 11/29/2016 FINAL  Final  Respiratory Panel by PCR     Status: None   Collection Time: 11/28/16  1:35 PM  Result Value Ref Range Status   Adenovirus NOT DETECTED NOT DETECTED Final   Coronavirus 229E NOT DETECTED NOT DETECTED Final   Coronavirus HKU1 NOT DETECTED NOT DETECTED Final   Coronavirus NL63 NOT DETECTED NOT DETECTED Final   Coronavirus OC43 NOT DETECTED NOT DETECTED Final   Metapneumovirus NOT DETECTED NOT DETECTED Final   Rhinovirus / Enterovirus NOT DETECTED NOT DETECTED Final   Influenza A NOT DETECTED NOT DETECTED Final   Influenza B NOT DETECTED NOT DETECTED Final   Parainfluenza Virus 1 NOT DETECTED NOT DETECTED Final   Parainfluenza Virus 2 NOT DETECTED NOT DETECTED Final   Parainfluenza Virus 3 NOT  DETECTED NOT DETECTED Final   Parainfluenza Virus 4 NOT DETECTED NOT DETECTED Final   Respiratory Syncytial Virus NOT DETECTED NOT DETECTED Final   Bordetella pertussis NOT DETECTED NOT DETECTED Final   Chlamydophila pneumoniae NOT DETECTED NOT DETECTED Final   Mycoplasma pneumoniae NOT DETECTED NOT DETECTED Final    Comment: Performed at [facility], [place] [address], [place], [place] [address]    RADIOLOGY STUDIES/RESULTS: Dg Chest 2 View  Result Date: 11/30/2016 CLINICAL DATA:  Acute on chronic respiratory failure with hypercapnia. Ex-smoker. EXAM: CHEST  2 VIEW COMPARISON:  Yesterday. FINDINGS: Normal sized heart. Tortuous aorta. Stable left subclavian pacemaker leads. Stable hyperexpansion of the lungs. Mild diffuse interstitial prominence in the right lung with mild progression. This includes Kerley lines in the lower lung zone. Clear left lung. Small amount of pleural fluid at the posterior lung bases on the lateral view. Mild bilateral shoulder degenerative changes. IMPRESSION: 1. Mildly increased interstitial lung disease on the right with some interstitial pulmonary edema and probable underlying interstitial pneumonitis. 2. Small right pleural effusion. 3. Stable changes of COPD. Electronically Signed   By: Claudie Revering M.D.   On: 11/30/2016 16:13   X-ray Chest Pa And Lateral  Result Date: 11/27/2016 CLINICAL DATA:  Increased weakness, fever and dyspnea over the past several days. EXAM: CHEST  2 VIEW COMPARISON:  11/10/2016. FINDINGS: Heart is within normal limits. Tortuous thoracic aorta with atherosclerosis. Left-sided ICD device projects over the shoulder with leads in the right atrium and right ventricle. Emphysematous hyperinflation of the lungs. Mild increase in vascular congestion is noted since previous study. No effusion or pneumothorax. No acute osseous abnormality. Degenerative changes are noted about the [facility] and glenohumeral joints. IMPRESSION: 1. COPD with slight increase  in vascular congestion since prior exam. No pneumothorax or pneumonic consolidation. 2. Aortic atherosclerosis. Electronically Signed   By: Ashley Royalty M.D.   On: 11/27/2016 20:14   Ct Biopsy  Result Date: 12/03/2016 INDICATION: 83 year old male with a history of anemia EXAM: CT BIOPSY; CT BONE MARROW  BIOPSY AND ASPIRATION MEDICATIONS: None. ANESTHESIA/SEDATION: Moderate (conscious) sedation was employed during this procedure. A total of Versed 1.0 mg and Fentanyl 50 mcg was administered intravenously. Moderate Sedation Time: 10 minutes. The patient's level of consciousness and vital signs were monitored continuously by radiology nursing throughout the procedure under my direct supervision. FLUOROSCOPY TIME:  CT COMPLICATIONS: None PROCEDURE: The procedure risks, benefits, and alternatives were explained to the patient. Questions regarding the procedure were encouraged and answered. The patient understands and consents to the procedure. Scout CT of the pelvis was performed for surgical planning purposes. The posterior pelvis was prepped with Betadinein a sterile fashion, and a sterile drape was applied covering the operative field. A sterile gown and sterile gloves were used for the procedure. Local anesthesia was provided with 1% Lidocaine. We targeted the right posterior iliac bone for biopsy. The skin and subcutaneous tissues were infiltrated with 1% lidocaine without epinephrine. A small stab incision was made with an 11 blade scalpel, and an 11 gauge Murphy needle was advanced with CT guidance to the posterior cortex. Manual forced was used to advance the needle through the posterior cortex and the stylet was removed. A bone marrow aspirate was retrieved and passed to a cytotechnologist in the room. The Murphy needle was then advanced without the stylet for a core biopsy. The core biopsy was retrieved and also passed to a cytotechnologist. Manual pressure was used for hemostasis and a sterile dressing was  placed. No complications were encountered no significant blood loss was encountered. Patient tolerated the procedure well and remained hemodynamically stable throughout. IMPRESSION: Status post CT-guided bone marrow biopsy, with tissue specimen sent to pathology for complete histopathologic analysis Signed, Dulcy Fanny. Earleen Newport, DO [facility] Radiology Electronically Signed   By: Corrie Mckusick D.O.   On: 12/03/2016 12:18   Dg Chest Port 1 View  Result Date: 12/02/2016 CLINICAL DATA:  Acute respiratory failure with hypoxia. EXAM: PORTABLE CHEST 1 VIEW COMPARISON:  Radiographs of November 30, 2016. FINDINGS: Stable cardiomediastinal silhouette. Atherosclerosis of thoracic aorta is noted. Left-sided pacemaker is unchanged in position. Left lung is clear. Stable interstitial prominence is noted in the right lung which may represent scarring, although superimposed acute edema or inflammation cannot be excluded. Bony thorax is unremarkable. IMPRESSION: Aortic atherosclerosis. Stable interstitial prominence is noted in the right lung which may represent scarring, although superimposed acute edema or inflammation cannot be excluded. Electronically Signed   By: Marijo Conception, M.D.   On: 12/02/2016 10:16   Dg Chest Port 1 View  Result Date: 11/29/2016 CLINICAL DATA:  Acute respiratory failure with hypoxemia. Ex-smoker. EXAM: PORTABLE CHEST 1 VIEW COMPARISON:  11/27/2016 and 11/10/2016. FINDINGS: Stable left subclavian pacemaker leads. Normal sized heart. The aorta remains diffusely enlarged and tortuous a partially calcified. Stable hyperexpansion of the lungs. Increased interstitial markings in the right mid and upper lung zone compared to 11/10/2016. Diffuse osteopenia. Superior migration of both humeral heads with bony remodeling. IMPRESSION: 1. Mild interstitial lung disease in the right mid and upper lung zones, new since 11/10/2016. This has an appearance most  compatible with interstitial pneumonitis. Asymmetrical interstitial pulmonary edema is less likely. 2. Stable changes of COPD. 3. Stable diffusely enlarged, tortuous and partially calcified thoracic aorta. 4. Bilateral large, chronic rotator cuff tears. Electronically Signed   By: Claudie Revering M.D.   On: 11/29/2016 07:52   Dg Chest Port 1 View  Result Date: 11/10/2016 CLINICAL DATA:  Hypoxia and anemia.  COPD exacerbation.  EXAM: PORTABLE CHEST 1 VIEW COMPARISON:  10/29/2016 FINDINGS: Emphysematous hyperinflation of the lungs are again noted with minimal scarring at the left lung base. No pneumonic consolidation or overt pulmonary edema. No pneumothorax. No pleural effusion. Heart size is within normal limits with right atrial pacer and right ventricular defibrillator leads in place. Left-sided ICD device projects over the shoulder. Tortuous aorta with aortic atherosclerosis, stable in appearance without aneurysm. Pulmonary vasculature is within normal limits. High-riding humeral heads again noted which may reflect chronic rotator cuff tears. IMPRESSION: 1. 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 2. Stable aortic atherosclerosis. 3. No acute pneumonic consolidation or CHF. Electronically Signed   By: Ashley Royalty M.D.   On: 11/10/2016 17:59   Ct Bone Marrow Biopsy & Aspiration  Result Date: 12/03/2016 INDICATION: 83 year old male with a history of anemia EXAM: CT BIOPSY; CT BONE MARROW BIOPSY AND ASPIRATION MEDICATIONS: None. ANESTHESIA/SEDATION: Moderate (conscious) sedation was employed during this procedure. A total of Versed 1.0 mg and Fentanyl 50 mcg was administered intravenously. Moderate Sedation Time: 10 minutes. The patient's level of consciousness and vital signs were monitored continuously by radiology nursing throughout the procedure under my direct supervision. FLUOROSCOPY TIME:  CT COMPLICATIONS: None PROCEDURE: The procedure risks, benefits, and alternatives were  explained to the patient. Questions regarding the procedure were encouraged and answered. The patient understands and consents to the procedure. Scout CT of the pelvis was performed for surgical planning purposes. The posterior pelvis was prepped with Betadinein a sterile fashion, and a sterile drape was applied covering the operative field. A sterile gown and sterile gloves were used for the procedure. Local anesthesia was provided with 1% Lidocaine. We targeted the right posterior iliac bone for biopsy. The skin and subcutaneous tissues were infiltrated with 1% lidocaine without epinephrine. A small stab incision was made with an 11 blade scalpel, and an 11 gauge Murphy needle was advanced with CT guidance to the posterior cortex. Manual forced was used to advance the needle through the posterior cortex and the stylet was removed. A bone marrow aspirate was retrieved and passed to a cytotechnologist in the room. The Murphy needle was then advanced without the stylet for a core biopsy. The core biopsy was retrieved and also passed to a cytotechnologist. Manual pressure was used for hemostasis and a sterile dressing was placed. No complications were encountered no significant blood loss was encountered. Patient tolerated the procedure well and remained hemodynamically stable throughout. IMPRESSION: Status post CT-guided bone marrow biopsy, with tissue specimen sent to pathology for complete histopathologic analysis Signed, Dulcy Fanny. Earleen Newport, DO [facility] Radiology Electronically Signed   By: Corrie Mckusick D.O.   On: 12/03/2016 12:18     LOS: 8 days   Oren Binet, MD  [facility] Pager:[phone]  If 7PM-7AM, please contact night-coverage [email or web address] Password [email or web address] 12/05/2016, 9:16 AM

## 2016-12-05 NOTE — Clinical Social Work Placement (Addendum)
[facility]. Complete. Patient will transport by [facility]. DNR placed. 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  Yes   Patient/family informed of their freedom to choose among providers that offer the needed level of care, that participate in Medicare, Medicaid or managed care program needed by the patient, have an available bed and are willing to accept the patient.  Yes   Patient/family informed of [facility]'s ownership interest in [facility] and [facility], as well as of the fact that they are under no obligation to receive care at these facilities.  PASRR submitted to EDS on       PASRR number received on       Existing PASRR number confirmed on 12/04/16     FL2 transmitted to all facilities in geographic area requested by pt/family on       FL2 transmitted to all facilities within larger geographic area on 12/04/16     Patient informed that his/her managed care company has contracts with or will negotiate with certain facilities, including the following:  [facility]     Yes   Patient/family informed of bed offers received.  Patient chooses bed at [facility]     Physician recommends and patient chooses bed at      Patient to be transferred to [facility] on  . 12/08/2016  Patient to be transferred to facility by [place]      Patient family notified on   of transfer. 12/08/2016  Name of family member notified:       Daughter [name] at bedside.   PHYSICIAN       Additional Comment:    _______________________________________________ Lia Hopping, LCSW 12/05/2016, 4:28 PM

## 2016-12-05 NOTE — Progress Notes (Signed)
Physical Therapy Treatment Patient Details Name: [name] Bowen, MD MRN: [id number] DOB: March 11, 1925 Today's Date: 12/05/2016    History of Present Illness Pt is a 83 y/o male admited with acute on chronic resp failure PMH includes DM, CKD, MI, COPD, CAD, CHF, and ischemic cardiomyopathy; this is his 3rd hospital admission in ~ 2 mos.  Patient s/p CT guided aspirate and core biopsy of right posterior iliac bone on 12/03/16    PT Comments    Pt improving, able to maintain O2 sats 99-100% on 4L  With short distance amb (likely can taper down today--pt is on 2.5 to 3L  At home); will continue to follow in acute setting  Follow Up Recommendations  Home health PT;Supervision for mobility/OOB     Equipment Recommendations  None recommended by PT    Recommendations for Other Services       Precautions / Restrictions Precautions Precautions: Fall Precaution Comments: Home O2 @ 2.5L to 3L Restrictions Weight Bearing Restrictions: No    Mobility  Bed Mobility               General bed mobility comments: in chair  Transfers Overall transfer level: Needs assistance Equipment used: Rolling walker (2 wheeled) Transfers: Sit to/from Stand Sit to Stand: Supervision;Min guard         General transfer comment: cues for hand placement  Ambulation/Gait Ambulation/Gait assistance: Min guard Ambulation Distance (Feet): 14 Feet Assistive device: Rolling walker (2 wheeled) Gait Pattern/deviations: Step-through pattern;Decreased stride length;Trunk flexed     General Gait Details: cues for posture, breathing, position from RW;  O2 sats 99-100%  on 4L --likely can taper O2 today   Stairs            Wheelchair Mobility    Modified Rankin (Stroke Patients Only)       Balance   Sitting-balance support: No upper extremity supported;Feet supported Sitting balance-Leahy Scale: Good     Standing balance support: Bilateral upper extremity supported;During functional  activity Standing balance-Leahy Scale: Poor(reliant on UEs)                              Cognition Arousal/Alertness: Awake/alert Behavior During Therapy: WFL for tasks assessed/performed Overall Cognitive Status: Within Functional Limits for tasks assessed                                 General Comments: [facility]      Exercises General Exercises - Upper Extremity Shoulder Horizontal ABduction: AROM;Both;10 reps;Seated Shoulder Horizontal ADduction: AROM;Both;10 reps;Seated General Exercises - Lower Extremity Ankle Circles/Pumps: AROM;Both;15 reps;Seated Long Arc Quad: AROM;Both;10 reps;Seated    General Comments        Pertinent Vitals/Pain Pain Assessment: No/denies pain    Home Living                      Prior Function            PT Goals (current goals can now be found in the care plan section) Acute Rehab PT Goals Patient Stated Goal: to feel better, breathe better PT Goal Formulation: With patient/family Time For Goal Achievement: 12/12/16 Potential to Achieve Goals: Fair Progress towards PT goals: Progressing toward goals    Frequency    Min 3X/week      PT Plan Current plan remains appropriate    Co-evaluation  AM-PAC PT "6 Clicks" Daily Activity  Outcome Measure  Difficulty turning over in bed (including adjusting bedclothes, sheets and blankets)?: A Little Difficulty moving from lying on back to sitting on the side of the bed? : A Little Difficulty sitting down on and standing up from a chair with arms (e.g., wheelchair, bedside commode, etc,.)?: A Little Help needed moving to and from a bed to chair (including a wheelchair)?: A Little Help needed walking in hospital room?: A Little Help needed climbing 3-5 steps with a railing? : A Lot 6 Click Score: 17    End of Session Equipment Utilized During Treatment: Gait belt;Oxygen Activity Tolerance: Patient tolerated treatment well Patient left:  in chair;with call bell/phone within reach   PT Visit Diagnosis: Unsteadiness on feet (R26.81);Muscle weakness (generalized) (M62.81)     Time: 8938-1017 PT Time Calculation (min) (ACUTE ONLY): 14 min  Charges:  $Gait Training: 8-22 mins                    G Codes:          Garnet Overfield December 20, 2016, 12:56 PM

## 2016-12-05 NOTE — Progress Notes (Signed)
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  He is up in a chair.  Objective: Vital signs in last 24 hours: Blood pressure (!) 116/58, pulse 75, temperature 98 F (36.7 C), temperature source Oral, resp. rate 18, height 6' (1.829 m), weight 155 lb 10.3 oz (70.6 kg), SpO2 99 %.  Intake/Output from previous day: 11/08 0701 - 11/09 0700 In: 3 [I.V.:3] Out: 550 [Urine:550]  Physical Exam: Not performed today  Portacath/PICC-without erythema  Lab Results: Recent Labs    12/03/16 0338 12/04/16 0342  WBC 2.7* 3.1*  HGB 8.0* 8.2*  HCT 25.1* 25.1*  PLT 65* 62*  Erythropoietin level 11/28/2016-33.5  BMET Recent Labs    12/03/16 0338  NA 139  K 4.6  CL 102  CO2 28  GLUCOSE 107*  BUN 33*  CREATININE 0.99  CALCIUM 8.2*     Medications: I have reviewed the patient's current medications.  Assessment/Plan: 1. Pancytopenia-transfuse with packed red blood cells 11/28/2016 2. History of beta thalassemia trait 3. History of vitamin B-12 deficiency-vitamin B-12 replacement initiated 11/11/2016 4. History of a serum monoclonal IgA kappa protein-elevated serum free kappa light chains , IgA, and M spike 11/11/2016  Bone marrow biopsy 12/03/2016- consistent with multiple myeloma 5. COPD with acute and chronic respiratory failure/hypoxemia 6. History of coronary artery disease 7. Ischemic cardiomyopathy 8. ICD in place 9. 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  He underwent a diagnostic bone marrow biopsy on 12/03/2016.  I discussed the bone marrow results with Dr. Gari Crown.  He indicates the bone marrow is involved with a kappa restricted plasma cell infiltrate consistent with multiple myeloma.    I discussed the bone marrow findings with [name].  We will plan to initiate treatment for myeloma when he is seen  for an office visit on 12/09/2016.  He indicates that he does have neuropathy involving the hands.  We will consider treatment with Revlimid, Decadron, and a protease inhibitor.  I will refer him for a diagnostic bone survey prior to discharge.  Recommendations:  1.  Metastatic bone survey 2.  Follow-up at the Cancer center as scheduled 12/09/2016, with the plan to begin treatment for multiple myeloma    LOS: 8 days   [name], MD   12/05/2016, 4:07 PM

## 2016-12-06 ENCOUNTER — Inpatient Hospital Stay (HOSPITAL_COMMUNITY): Payer: Medicare Other

## 2016-12-06 DIAGNOSIS — J9622 Acute and chronic respiratory failure with hypercapnia: Secondary | ICD-10-CM

## 2016-12-06 DIAGNOSIS — D696 Thrombocytopenia, unspecified: Secondary | ICD-10-CM

## 2016-12-06 LAB — GLUCOSE, CAPILLARY: GLUCOSE-CAPILLARY: 98 mg/dL (ref 65–99)

## 2016-12-06 MED ORDER — MENTHOL 3 MG MT LOZG
1.0000 | LOZENGE | OROMUCOSAL | Status: DC | PRN
  Administered 2016-12-06: 3 mg via ORAL
  Filled 2016-12-06: qty 9

## 2016-12-06 MED ORDER — ALBUTEROL SULFATE (2.5 MG/3ML) 0.083% IN NEBU
2.5000 mg | INHALATION_SOLUTION | Freq: Four times a day (QID) | RESPIRATORY_TRACT | Status: DC
  Administered 2016-12-06 – 2016-12-08 (×7): 2.5 mg via RESPIRATORY_TRACT
  Filled 2016-12-06 (×7): qty 3

## 2016-12-06 MED ORDER — PHENOL 1.4 % MT LIQD: 1.0000 | OROMUCOSAL | Status: DC | PRN

## 2016-12-06 MED ORDER — ALPRAZOLAM 0.25 MG PO TABS
0.2500 mg | ORAL_TABLET | Freq: Once | ORAL | Status: AC
  Administered 2016-12-06: 0.25 mg via ORAL

## 2016-12-06 NOTE — Progress Notes (Signed)
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 Sex: male Date of Birth: 1925/12/13 Admit Date: 11/27/2016 Admitting Physician Javier Glazier, MD KAJ:[id number], [place] Ar, MD  Brief Narrative:  Patient is a 82 y.o. Male with past medical history of severe COPD, on home O2, chronic iron deficiency anemia, recent hospitalizations for COPD exacerbation/pneumonia, admitted for fever and weakness. The patient was admitted by pulmonary critical care to the ICU, he was given steroids, bronchodilators. He was found to have pancytopenia. Plans are to pursue any bone marrow biopsy today.   Was actually discharged on 12/05/2016, I was informed by the staff that daughter had called the nursing home on 12/05/2016 and told them that she would try to extend the discharge until Monday or Tuesday as much as possible.  I went to see the patient on 12/06/2016 first day of assuming his care, daughter asked me can I keep the patient here till Tuesday to which I responded that I have to evaluate the patient first before I make my decision.  After examining my opinion was that patient is stable to be discharged to SNF.  Daughter then proceeded to ask if I could provide 1 week of hospital medications when he goes to SNF to which I said I will have the pharmacy, and talk to her and if they are able to do so I would be more than happy to prescribe.  She then asked me to call patient's ophthalmologist Dr. Sherlynn Stalls for macular degeneration to which I said this was nonurgent can be done outpatient.  She then asked me his prognosis from underlying long-standing COPD which I said was poor and is not been a change, she then asked me that she does not know about the hematologist Dr. Gearldine Shown plan and would like to stay Monday or Tuesday to know about the plan, I reviewed the chart and told her about oncology's plan of seeing him in the office on Tuesday and then deciding on  treatment.  She then told me that she has planned to appeal the discharge as she does not want the patient to go to SNF due to lack of support staff at SNF over the weekend.   Subjective:  Patient in bed, appears comfortable, called me another [place] doctor when I arrived, denies any headache, no fever, no chest pain or pressure, no shortness of breath , no abdominal pain. No focal weakness.   Assessment/Plan:  Acute on chronic hypoxic respiratory failure due to acute on chronic COPD exacerbation.  On 3 L nasal cannula oxygen at baseline follows with Dr. Creig Hines:   Poor long-term prognosis and baseline, he has had multiple hospital admissions recently, has finished steroid treatment, currently close to baseline, encouraged to sit up in chair and use flutter valve for pulmonary toiletry.  Medically stable and was discharged to SNF on 12/05/2016.  However family reluctant to take him to SNF as they think there is lack of staffing at SNF.  The social worker and case management informed.  Continue supportive care with oxygen nebulizer treatments and flutter valve for pulmonary toiletry.  They have been suggested to consider palliative care per previous physician a few days ago.   Pancytopenia: Has had worsening pancytopenia over the past few months, per hematology patient has a thalassemia trait, MGUS-but worsening pancytopenia felt  to be probably due to her underlying myelodysplastic disorder. Underwent bone marrow biopsy on 11/7, results look consistent with multiple myeloma reviewed oncology's note patient to follow in the office on 12/09/2016 with possible initiation of medical treatment.  History of chronic diastolic heart failure: Compensated, continue metoprolol.   History of CAD: Currently without chest pain, continue  statin and metoprolol. 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  Dyslipidemia: Continue statin  Anxiety: Currently appears to be stable, continue as needed Xanax.  GERD: Continue PPI  Debility/frailty/failure to thrive syndrome: 83 year old male with advanced COPD on home O2-with numerous recent hospitalizations-with possible myelodysplastic syndrome-(awaiting bone marrow biopsy results)-admitted again with worsening shortness of breath, felt to be viral bronchitis causing COPD exacerbation. He is incredibly frail, and chronically sick appearing- although anemia may be playing a role in worsening shortness of breath, I think that advanced age/debility and severe COPD are also contributing significantly to his overall poor health. Is anything he has significant stress worrisome findings on bone marrow biopsy, given his overall health/frailty-it will not change any short-term management, glucose he clearly is not a candidate at this time for aggressive care. DO NOT RESUSCITATE is in place, family is aware of for long-term prognoses. He will probably require palliative care follow-up at SNF. If in the future, he decompensates or continues to have recurrent hospitalizations in spite of maximal cares for support, it may be prudent to engage palliative care services as well.   DVT Prophylaxis: SCD's  Code Status: DNR  Family Communication: Spoke with patient's daughter bedside  Disposition Plan: Remain inpatient-SNF soon.  Antimicrobial agents: Anti-infectives (From admission, onward)   Start     Dose/Rate Route Frequency Ordered Stop   11/28/16 2000  vancomycin (VANCOCIN) IVPB 1000 mg/200 mL premix  Status:  Discontinued     1,000 mg 200 mL/hr over 60 Minutes Intravenous Every 24 hours 11/27/16 1741 11/27/16 2016   11/28/16 2000  vancomycin (VANCOCIN) IVPB 750 mg/150 ml premix  Status:  Discontinued     750 mg 150 mL/hr over 60 Minutes Intravenous Every 24 hours 11/27/16 2016 11/28/16 1049   11/27/16 1830  vancomycin (VANCOCIN) 1,500 mg in  sodium chloride 0.9 % 500 mL IVPB     1,500 mg 250 mL/hr over 120 Minutes Intravenous  Once 11/27/16 1741 11/27/16 2129   11/27/16 1800  ceFEPIme (MAXIPIME) 2 g in dextrose 5 % 50 mL IVPB  Status:  Discontinued     2 g 100 mL/hr over 30 Minutes Intravenous Every 24 hours 11/27/16 1712 11/28/16 1131      Procedures: None  CONSULTS:  pulmonary/intensive care and hematology/oncology  Time spent: 25 minutes-Greater than 50% of this time was spent in counseling, explanation of diagnosis, planning of further management, and coordination of care.  MEDICATIONS: Scheduled Meds: . arformoterol  15 mcg Nebulization BID  . budesonide  0.5 mg Nebulization BID  . fluticasone  1 spray Each Nare Daily  . guaiFENesin  600 mg Oral BID  . metoprolol tartrate  12.5 mg Oral BID  . pantoprazole  40 mg Oral Daily  . rosuvastatin  5 mg Oral Daily  . vitamin B-12  1,000 mcg Oral Daily  . ascorbic acid  250 mg Oral BID   Continuous Infusions: . sodium chloride 10 mL/hr at 11/30/16 0600   PRN Meds:.acetaminophen **OR** acetaminophen, albuterol, ALPRAZolam, docusate sodium, magnesium hydroxide, menthol-cetylpyridinium, ondansetron **  OR** [DISCONTINUED] ondansetron (ZOFRAN) IV, senna-docusate   PHYSICAL EXAM: Vital signs: Vitals:   12/06/16 0518 12/06/16 0748 12/06/16 0752 12/06/16 1116  BP: 123/64     Pulse: 79     Resp: 18     Temp: 98 F (36.7 C)     TempSrc: Oral     SpO2: 97% 99% 99% 98%  Weight:      Height:       Filed Weights   12/02/16 0455 12/03/16 0350 12/04/16 0500  Weight: 71.1 kg (156 lb 12 oz) 70.8 kg (156 lb 1.4 oz) 70.6 kg (155 lb 10.3 oz)   Body mass index is 21.11 kg/m.   Exam  Awake Alert, No new F.N deficits, Normal affect [place].AT,PERRAL Supple Neck,No JVD, No cervical lymphadenopathy appriciated.  Symmetrical Chest wall movement, Mod air movement bilaterally, few wheezes RRR,No Gallops, Rubs or new Murmurs, No Parasternal Heave +ve B.Sounds, Abd Soft, No  tenderness, No organomegaly appriciated, No rebound - guarding or rigidity. No Cyanosis, Clubbing or edema, No new Rash or bruise   I have personally reviewed following labs and imaging studies  LABORATORY DATA: CBC: Recent Labs  Lab 11/30/16 0833 12/01/16 0411 12/02/16 0350 12/03/16 0338 12/04/16 0342  WBC 3.2* 3.5* 2.9* 2.7* 3.1*  NEUTROABS 2.5 3.0 2.2 2.0 2.4  HGB 8.2* 8.6* 8.3* 8.0* 8.2*  HCT 25.7* 26.8* 26.1* 25.1* 25.1*  MCV 75.6* 76.1* 76.3* 76.8* 76.3*  PLT 48* 52* 64* 65* 62*    Basic Metabolic Panel: Recent Labs  Lab 11/30/16 0833 12/01/16 0411 12/02/16 0350 12/03/16 0338  NA 140 142 140 139  K 3.7 4.1 4.3 4.6  CL 102 103 101 102  CO2 '29 30 28 28  '$ GLUCOSE 94 94 84 107*  BUN 43* 38* 35* 33*  CREATININE 1.34* 1.04 1.03 0.99  CALCIUM 8.5* 8.3* 8.2* 8.2*    GFR: Estimated Creatinine Clearance: 50.5 mL/min (by C-G formula based on SCr of 0.99 mg/dL).  Liver Function Tests: Recent Labs  Lab 11/30/16 0833 12/01/16 0411 12/02/16 0350 12/03/16 0338  AST 15 13* 12* 17  ALT 15* 16* 15* 12*  ALKPHOS 42 42 39 39  BILITOT 0.4 0.6 0.7 0.9  PROT 5.6* 5.6* 5.5* 5.4*  ALBUMIN 2.2* 2.3* 2.2* 2.2*   No results for input(s): LIPASE, AMYLASE in the last 168 hours. No results for input(s): AMMONIA in the last 168 hours.  Coagulation Profile: No results for input(s): INR, PROTIME in the last 168 hours.  Cardiac Enzymes: No results for input(s): CKTOTAL, CKMB, CKMBINDEX, TROPONINI in the last 168 hours.  BNP (last 3 results) No results for input(s): PROBNP in the last 8760 hours.  HbA1C: No results for input(s): HGBA1C in the last 72 hours.  CBG: Recent Labs  Lab 12/01/16 0752 12/02/16 0740 12/03/16 0738 12/04/16 0755 12/06/16 0743  GLUCAP 97 95 90 121* 98    Lipid Profile: No results for input(s): CHOL, HDL, LDLCALC, TRIG, CHOLHDL, LDLDIRECT in the last 72 hours.  Thyroid Function Tests: No results for input(s): TSH, T4TOTAL, FREET4, T3FREE,  THYROIDAB in the last 72 hours.  Anemia Panel: No results for input(s): VITAMINB12, FOLATE, FERRITIN, TIBC, IRON, RETICCTPCT in the last 72 hours.  Urine analysis:    Component Value Date/Time   COLORURINE YELLOW 11/27/2016 2121   APPEARANCEUR CLOUDY (A) 11/27/2016 2121   LABSPEC 1.018 11/27/2016 2121   LABSPEC 1.025 05/01/2010 1454   PHURINE 5.0 11/27/2016 2121   GLUCOSEU NEGATIVE 11/27/2016 2121   [place] NEGATIVE 11/27/2016 2121  BILIRUBINUR NEGATIVE 11/27/2016 2121   BILIRUBINUR Negative 05/01/2010 1454   KETONESUR 5 (A) 11/27/2016 2121   PROTEINUR 30 (A) 11/27/2016 2121   UROBILINOGEN 0.2 06/07/2014 2018   NITRITE NEGATIVE 11/27/2016 2121   LEUKOCYTESUR NEGATIVE 11/27/2016 2121   LEUKOCYTESUR Negative 05/01/2010 1454    Sepsis Labs: Lactic Acid, Venous    Component Value Date/Time   LATICACIDVEN 0.9 11/10/2016 2033    MICROBIOLOGY: Recent Results (from the past 240 hour(s))  Culture, blood (Routine X 2) w Reflex to ID Panel     Status: None   Collection Time: 11/27/16  6:36 PM  Result Value Ref Range Status   Specimen Description BLOOD RIGHT ARM  Final   Special Requests   Final    BOTTLES DRAWN AEROBIC ONLY Blood Culture adequate volume   Culture   Final    NO GROWTH 5 DAYS Performed at [facility], [place] [address], [place], [place] [address]    Report Status 12/02/2016 FINAL  Final  Culture, blood (Routine X 2) w Reflex to ID Panel     Status: None   Collection Time: 11/27/16  6:36 PM  Result Value Ref Range Status   Specimen Description BLOOD RIGHT HAND  Final   Special Requests IN PEDIATRIC BOTTLE Blood Culture adequate volume  Final   Culture   Final    NO GROWTH 5 DAYS Performed at [facility], [place] [address], [place], [place] [address]    Report Status 12/02/2016 FINAL  Final  Urine culture     Status: None   Collection Time: 11/27/16  9:21 PM  Result Value Ref Range Status   Specimen Description URINE, CATHETERIZED  Final   Special  Requests NONE  Final   Culture   Final    NO GROWTH Performed at [facility], [place] [address], [place], [place] [address]    Report Status 11/29/2016 FINAL  Final  Respiratory Panel by PCR     Status: None   Collection Time: 11/28/16  1:35 PM  Result Value Ref Range Status   Adenovirus NOT DETECTED NOT DETECTED Final   Coronavirus 229E NOT DETECTED NOT DETECTED Final   Coronavirus HKU1 NOT DETECTED NOT DETECTED Final   Coronavirus NL63 NOT DETECTED NOT DETECTED Final   Coronavirus OC43 NOT DETECTED NOT DETECTED Final   Metapneumovirus NOT DETECTED NOT DETECTED Final   Rhinovirus / Enterovirus NOT DETECTED NOT DETECTED Final   Influenza A NOT DETECTED NOT DETECTED Final   Influenza B NOT DETECTED NOT DETECTED Final   Parainfluenza Virus 1 NOT DETECTED NOT DETECTED Final   Parainfluenza Virus 2 NOT DETECTED NOT DETECTED Final   Parainfluenza Virus 3 NOT DETECTED NOT DETECTED Final   Parainfluenza Virus 4 NOT DETECTED NOT DETECTED Final   Respiratory Syncytial Virus NOT DETECTED NOT DETECTED Final   Bordetella pertussis NOT DETECTED NOT DETECTED Final   Chlamydophila pneumoniae NOT DETECTED NOT DETECTED Final   Mycoplasma pneumoniae NOT DETECTED NOT DETECTED Final    Comment: Performed at [facility], [place] [address], [place], [place] [address]    RADIOLOGY STUDIES/RESULTS: Dg Chest 2 View  Result Date: 11/30/2016 CLINICAL DATA:  Acute on chronic respiratory failure with hypercapnia. Ex-smoker. EXAM: CHEST  2 VIEW COMPARISON:  Yesterday. FINDINGS: Normal sized heart. Tortuous aorta. Stable left subclavian pacemaker leads. Stable hyperexpansion of the lungs. Mild diffuse interstitial prominence in the right lung with mild progression. This includes Kerley lines in the lower lung zone. Clear  left lung. Small amount of pleural fluid at the posterior lung bases on the lateral view. Mild bilateral shoulder degenerative changes. IMPRESSION: 1. Mildly increased interstitial lung  disease on the right with some interstitial pulmonary edema and probable underlying interstitial pneumonitis. 2. Small right pleural effusion. 3. Stable changes of COPD. Electronically Signed   By: Claudie Revering M.D.   On: 11/30/2016 16:13   X-ray Chest Pa And Lateral  Result Date: 11/27/2016 CLINICAL DATA:  Increased weakness, fever and dyspnea over the past several days. EXAM: CHEST  2 VIEW COMPARISON:  11/10/2016. FINDINGS: Heart is within normal limits. Tortuous thoracic aorta with atherosclerosis. Left-sided ICD device projects over the shoulder with leads in the right atrium and right ventricle. Emphysematous hyperinflation of the lungs. Mild increase in vascular congestion is noted since previous study. No effusion or pneumothorax. No acute osseous abnormality. Degenerative changes are noted about the [facility] and glenohumeral joints. IMPRESSION: 1. COPD with slight increase in vascular congestion since prior exam. No pneumothorax or pneumonic consolidation. 2. Aortic atherosclerosis. Electronically Signed   By: Ashley Royalty M.D.   On: 11/27/2016 20:14   Ct Biopsy  Result Date: 12/03/2016 INDICATION: 83 year old male with a history of anemia EXAM: CT BIOPSY; CT BONE MARROW BIOPSY AND ASPIRATION MEDICATIONS: None. ANESTHESIA/SEDATION: Moderate (conscious) sedation was employed during this procedure. A total of Versed 1.0 mg and Fentanyl 50 mcg was administered intravenously. Moderate Sedation Time: 10 minutes. The patient's level of consciousness and vital signs were monitored continuously by radiology nursing throughout the procedure under my direct supervision. FLUOROSCOPY TIME:  CT COMPLICATIONS: None PROCEDURE: The procedure risks, benefits, and alternatives were explained to the patient. Questions regarding the procedure were encouraged and answered. The patient understands and consents to the procedure. Scout CT of the pelvis was performed for surgical planning purposes. The posterior pelvis was prepped  with Betadinein a sterile fashion, and a sterile drape was applied covering the operative field. A sterile gown and sterile gloves were used for the procedure. Local anesthesia was provided with 1% Lidocaine. We targeted the right posterior iliac bone for biopsy. The skin and subcutaneous tissues were infiltrated with 1% lidocaine without epinephrine. A small stab incision was made with an 11 blade scalpel, and an 11 gauge Murphy needle was advanced with CT guidance to the posterior cortex. Manual forced was used to advance the needle through the posterior cortex and the stylet was removed. A bone marrow aspirate was retrieved and passed to a cytotechnologist in the room. The Murphy needle was then advanced without the stylet for a core biopsy. The core biopsy was retrieved and also passed to a cytotechnologist. Manual pressure was used for hemostasis and a sterile dressing was placed. No complications were encountered no significant blood loss was encountered. Patient tolerated the procedure well and remained hemodynamically stable throughout. IMPRESSION: Status post CT-guided bone marrow biopsy, with tissue specimen sent to pathology for complete histopathologic analysis Signed, Dulcy Fanny. Earleen Newport, DO [facility] Electronically Signed   By: Corrie Mckusick D.O.   On: 12/03/2016 12:18   Dg Chest Port 1 View  Result Date: 12/02/2016 CLINICAL DATA:  Acute respiratory failure with hypoxia. EXAM: PORTABLE CHEST 1 VIEW COMPARISON:  Radiographs of November 30, 2016. FINDINGS: Stable cardiomediastinal silhouette. Atherosclerosis of thoracic aorta is noted. Left-sided pacemaker is unchanged in position. Left lung is clear. Stable interstitial prominence is noted in the right lung which may represent scarring, although superimposed acute edema or inflammation cannot be  excluded. Bony thorax is unremarkable. IMPRESSION: Aortic atherosclerosis. Stable interstitial  prominence is noted in the right lung which may represent scarring, although superimposed acute edema or inflammation cannot be excluded. Electronically Signed   By: Marijo Conception, M.D.   On: 12/02/2016 10:16   Dg Chest Port 1 View  Result Date: 11/29/2016 CLINICAL DATA:  Acute respiratory failure with hypoxemia. Ex-smoker. EXAM: PORTABLE CHEST 1 VIEW COMPARISON:  11/27/2016 and 11/10/2016. FINDINGS: Stable left subclavian pacemaker leads. Normal sized heart. The aorta remains diffusely enlarged and tortuous a partially calcified. Stable hyperexpansion of the lungs. Increased interstitial markings in the right mid and upper lung zone compared to 11/10/2016. Diffuse osteopenia. Superior migration of both humeral heads with bony remodeling. IMPRESSION: 1. Mild interstitial lung disease in the right mid and upper lung zones, new since 11/10/2016. This has an appearance most compatible with interstitial pneumonitis. Asymmetrical interstitial pulmonary edema is less likely. 2. Stable changes of COPD. 3. Stable diffusely enlarged, tortuous and partially calcified thoracic aorta. 4. Bilateral large, chronic rotator cuff tears. Electronically Signed   By: Claudie Revering M.D.   On: 11/29/2016 07:52   Dg Chest Port 1 View  Result Date: 11/10/2016 CLINICAL DATA:  Hypoxia and anemia.  COPD exacerbation. EXAM: PORTABLE CHEST 1 VIEW COMPARISON:  10/29/2016 FINDINGS: Emphysematous hyperinflation of the lungs are again noted with minimal scarring at the left lung base. No pneumonic consolidation or overt pulmonary edema. No pneumothorax. No pleural effusion. Heart size is within normal limits with right atrial pacer and right ventricular defibrillator leads in place. Left-sided ICD device projects over the shoulder. Tortuous aorta with aortic atherosclerosis, stable in appearance without aneurysm. Pulmonary vasculature is within normal limits. High-riding humeral heads again noted which may reflect chronic rotator cuff  tears. IMPRESSION: 1. 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 2. Stable aortic atherosclerosis. 3. No acute pneumonic consolidation or CHF. Electronically Signed   By: Ashley Royalty M.D.   On: 11/10/2016 17:59   Ct Bone Marrow Biopsy & Aspiration  Result Date: 12/03/2016 INDICATION: 83 year old male with a history of anemia EXAM: CT BIOPSY; CT BONE MARROW BIOPSY AND ASPIRATION MEDICATIONS: None. ANESTHESIA/SEDATION: Moderate (conscious) sedation was employed during this procedure. A total of Versed 1.0 mg and Fentanyl 50 mcg was administered intravenously. Moderate Sedation Time: 10 minutes. The patient's level of consciousness and vital signs were monitored continuously by radiology nursing throughout the procedure under my direct supervision. FLUOROSCOPY TIME:  CT COMPLICATIONS: None PROCEDURE: The procedure risks, benefits, and alternatives were explained to the patient. Questions regarding the procedure were encouraged and answered. The patient understands and consents to the procedure. Scout CT of the pelvis was performed for surgical planning purposes. The posterior pelvis was prepped with Betadinein a sterile fashion, and a sterile drape was applied covering the operative field. A sterile gown and sterile gloves were used for the procedure. Local anesthesia was provided with 1% Lidocaine. We targeted the right posterior iliac bone for biopsy. The skin and subcutaneous tissues were infiltrated with 1% lidocaine without epinephrine. A small stab incision was made with an 11 blade scalpel, and an 11 gauge Murphy needle was advanced with CT guidance to the posterior cortex. Manual forced was used to advance the needle through the posterior cortex and the stylet was removed. A bone marrow aspirate was retrieved and passed to a cytotechnologist in the room. The Murphy needle was then advanced without the stylet for a core biopsy. The core biopsy was retrieved and also  passed to a  cytotechnologist. Manual pressure was used for hemostasis and a sterile dressing was placed. No complications were encountered no significant blood loss was encountered. Patient tolerated the procedure well and remained hemodynamically stable throughout. IMPRESSION: Status post CT-guided bone marrow biopsy, with tissue specimen sent to pathology for complete histopathologic analysis Signed, Dulcy Fanny. Earleen Newport, DO [facility] Radiology Electronically Signed   By: Corrie Mckusick D.O.   On: 12/03/2016 12:18     LOS: 9 days   Signature  Lala Lund M.D on 12/06/2016 at 11:40 AM  Between 7am to 7pm - Pager - [phone] ( page via [email or web address], text pages only, please mention full 10 digit call back number).  After 7pm go to [email or web address] - password [email or web address]

## 2016-12-06 NOTE — Care Management Note (Signed)
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 Pt scheduled dc to SNF today. Dtr wants to appeal dc. Medicare IM given.   PCP [name] MD  Expected Discharge Date:  12/06/16               Expected Discharge Plan:  Skilled Nursing Facility  In-House Referral:  Clinical Social Work  Discharge planning Services  CM Consult  Post Acute Care Choice:  NA Choice offered to:  NA  DME Arranged:  N/A DME Agency:  NA  HH Arranged:  NA HH Agency:  NA  Status of Service:  Completed, signed off  If discussed at [place] of Stay Meetings, dates discussed:    Additional Comments:  Erenest Rasher, RN 12/06/2016, 9:13 AM

## 2016-12-06 NOTE — Progress Notes (Signed)
Attempted discontinuing foley catheter per MD's order; but pt refused. Risks of catheter remaining in place to include infection explained, but pt still refused. MD made aware. Hale Bogus.

## 2016-12-06 NOTE — Progress Notes (Addendum)
Detailed Notice of Discharge explained to dtr, [name]. Faxed required paperwork to [facility] for to complete appeal process. Jonnie Finner RN CCM Case Mgmt phone [phone]  Progress Notes Detailed Notice of Discharge IM Admit Order Discharge Order Dc summary PT notes CM/SW notes facesheet

## 2016-12-06 NOTE — Progress Notes (Signed)
Notified by Lenard Forth at [facility] of expedited appeal of discharge. Case number [id number]. CM Department will fax documents. [facility] has up to 72h to make a determination, during which DC is held.

## 2016-12-06 NOTE — Care Management Important Message (Signed)
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

## 2016-12-07 LAB — GLUCOSE, CAPILLARY: GLUCOSE-CAPILLARY: 102 mg/dL — AB (ref 65–99)

## 2016-12-07 MED ORDER — ALPRAZOLAM 0.25 MG PO TABS
0.2500 mg | ORAL_TABLET | Freq: Once | ORAL | Status: AC
  Administered 2016-12-07: 0.25 mg via ORAL
  Filled 2016-12-07: qty 1

## 2016-12-07 MED ORDER — FUROSEMIDE 10 MG/ML IJ SOLN
20.0000 mg | Freq: Once | INTRAMUSCULAR | Status: AC
  Administered 2016-12-07: 20 mg via INTRAVENOUS

## 2016-12-07 MED ORDER — FUROSEMIDE 10 MG/ML IJ SOLN
INTRAMUSCULAR | Status: AC
  Filled 2016-12-07: qty 2

## 2016-12-07 NOTE — Progress Notes (Signed)
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 Sex: male Date of Birth: 26-Feb-1925 Admit Date: 11/27/2016 Admitting Physician Javier Glazier, MD SEG:BTDVVO, Denton Ar, MD  Brief Narrative:  Patient is a 83 y.o. Male with past medical history of severe COPD, on home O2, chronic iron deficiency anemia, recent hospitalizations for COPD exacerbation/pneumonia, admitted for fever and weakness. The patient was admitted by pulmonary critical care to the ICU, he was given steroids, bronchodilators. He was found to have pancytopenia. Plans are to pursue any bone marrow biopsy today.   Was actually discharged on 12/05/2016, I was informed by the staff that daughter had called the nursing home on 12/05/2016 and told them that she would try to extend the discharge until Monday or Tuesday as much as possible.  I went to see the patient on 12/06/2016 first day of assuming his care, daughter asked me can I keep the patient here till Tuesday to which I responded that I have to evaluate the patient first before I make my decision.  After examining my opinion was that patient is stable to be discharged to SNF.  Patient proceeded to call me another [place] doctor.  Daughter then proceeded to ask if I could provide 1 week of hospital medications when he goes to SNF to which I said I will have the pharmacy, and talk to her and if they are able to do so I would be more than happy to prescribe.  She then asked me to call patient's ophthalmologist Dr. Sherlynn Stalls for macular degeneration to which I said this was nonurgent can be done outpatient.  She then asked me his prognosis from underlying long-standing COPD which I said was poor and is not been a change, she then asked me that she does not know about the hematologist Dr. Gearldine Shown plan and would like to stay Monday or Tuesday to know about the plan, I reviewed the chart and told her about oncology's plan of  seeing him in the office on Tuesday and then deciding on treatment.  She then told me that she has planned to appeal the discharge as she does not want the patient to go to SNF due to lack of support staff at SNF over the weekend.   He remains discharged from the hospital, discharge was done on 12/05/2016.  No new issues.  Patient is asymptomatic although poor baseline and poor long-term prognosis due to extremely advanced baseline COPD and advanced age.   Subjective:  Patient in bed, appears comfortable, denies any headache, no fever, no chest pain or pressure, no shortness of breath , no abdominal pain. No focal weakness.  Refused to take Foley catheter out, understands the risk of UTI.  Assessment/Plan:  Acute on chronic hypoxic respiratory failure due to acute on chronic COPD exacerbation.  On 3 L nasal cannula oxygen at baseline due to advanced COPD, he follows with Dr. Creig Hines:   Poor long-term prognosis and baseline, he has had multiple hospital admissions recently, has finished steroid treatment, currently close to baseline, encouraged to sit up in chair and use flutter valve for pulmonary toiletry.  Medically stable and was discharged to SNF on 12/05/2016.   However family reluctant to take him to SNF as they think there is lack of staffing at SNF.  The Education officer, museum and case  management informed.  Continue supportive care with oxygen nebulizer treatments, encouraged to sit up in the chair and use flutter valve for pulmonary toiletry.  They have been suggested to consider palliative care per previous physician a few days ago.   Pancytopenia: Has had worsening pancytopenia over the past few months, per hematology patient has a thalassemia trait, MGUS-but worsening pancytopenia felt to be probably due to her underlying myelodysplastic disorder. Underwent bone marrow biopsy on 11/7, results look consistent with multiple myeloma reviewed oncology's note patient to follow in the office on  12/09/2016 with possible initiation of medical treatment.  History of chronic diastolic heart failure: Compensated, continue metoprolol.   History of CAD: Currently without chest pain, continue  statin and metoprolol. 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  Dyslipidemia: Continue statin  Anxiety: Currently appears to be stable, continue as needed Xanax.  GERD: Continue PPI.  Chronic urinary retention.  At baseline self caths himself every 4-6 hours, currently has Foley and refusing to take it out, patient and family adequately warned about possible UTI.    Debility/frailty/failure to thrive syndrome: 83 year old male with advanced COPD on home O2-with numerous recent hospitalizations-with possible myelodysplastic syndrome-(awaiting bone marrow biopsy results)-admitted again with worsening shortness of breath, felt to be viral bronchitis causing COPD exacerbation. He is incredibly frail, and chronically sick appearing- although anemia may be playing a role in worsening shortness of breath, I think that advanced age/debility and severe COPD are also contributing significantly to his overall poor health. Is anything he has significant stress worrisome findings on bone marrow biopsy, given his overall health/frailty-it will not change any short-term management, glucose he clearly is not a candidate at this time for aggressive care. DO NOT RESUSCITATE is in place, family is aware of for long-term prognoses. He will probably require palliative care follow-up at SNF. If in the future, he decompensates or continues to have recurrent hospitalizations in spite of maximal cares for support, it may be prudent to engage palliative care services as well.   DVT Prophylaxis: SCD's  Code Status: DNR  Family Communication: Spoke with patient's daughter bedside  Disposition Plan: Remain  inpatient-SNF soon.  Antimicrobial agents: Anti-infectives (From admission, onward)   Start     Dose/Rate Route Frequency Ordered Stop   11/28/16 2000  vancomycin (VANCOCIN) IVPB 1000 mg/200 mL premix  Status:  Discontinued     1,000 mg 200 mL/hr over 60 Minutes Intravenous Every 24 hours 11/27/16 1741 11/27/16 2016   11/28/16 2000  vancomycin (VANCOCIN) IVPB 750 mg/150 ml premix  Status:  Discontinued     750 mg 150 mL/hr over 60 Minutes Intravenous Every 24 hours 11/27/16 2016 11/28/16 1049   11/27/16 1830  vancomycin (VANCOCIN) 1,500 mg in sodium chloride 0.9 % 500 mL IVPB     1,500 mg 250 mL/hr over 120 Minutes Intravenous  Once 11/27/16 1741 11/27/16 2129   11/27/16 1800  ceFEPIme (MAXIPIME) 2 g in dextrose 5 % 50 mL IVPB  Status:  Discontinued     2 g 100 mL/hr over 30 Minutes Intravenous Every 24 hours 11/27/16 1712 11/28/16 1131      Procedures: None  CONSULTS:  pulmonary/intensive care and hematology/oncology  Time spent: 25 minutes-Greater than 50% of this time was spent in counseling, explanation of diagnosis, planning of further management, and coordination of care.  MEDICATIONS: Scheduled Meds: . albuterol  2.5 mg Nebulization  Q6H  . arformoterol  15 mcg Nebulization BID  . budesonide  0.5 mg Nebulization BID  . fluticasone  1 spray Each Nare Daily  . guaiFENesin  600 mg Oral BID  . metoprolol tartrate  12.5 mg Oral BID  . pantoprazole  40 mg Oral Daily  . rosuvastatin  5 mg Oral Daily  . vitamin B-12  1,000 mcg Oral Daily  . ascorbic acid  250 mg Oral BID   Continuous Infusions:  PRN Meds:.acetaminophen **OR** [DISCONTINUED] acetaminophen, albuterol, ALPRAZolam, docusate sodium, magnesium hydroxide, menthol-cetylpyridinium, ondansetron **OR** [DISCONTINUED] ondansetron (ZOFRAN) IV, senna-docusate   PHYSICAL EXAM: Vital signs: Vitals:   12/06/16 2015 12/07/16 0107 12/07/16 0500 12/07/16 0800  BP: 138/89  (!) 106/50   Pulse: 83  69   Resp: 20  20     Temp: 98.6 F (37 C)  98.6 F (37 C)   TempSrc: Oral  Oral   SpO2:  97% 100% 98%  Weight:      Height:       Filed Weights   12/02/16 0455 12/03/16 0350 12/04/16 0500  Weight: 71.1 kg (156 lb 12 oz) 70.8 kg (156 lb 1.4 oz) 70.6 kg (155 lb 10.3 oz)   Body mass index is 21.11 kg/m.   Exam  Awake Alert, No new F.N deficits, Normal affect [place].AT,PERRAL Supple Neck,No JVD, No cervical lymphadenopathy appriciated.  Symmetrical Chest wall movement, Mod air movement bilaterally, few wheezes RRR,No Gallops, Rubs or new Murmurs, No Parasternal Heave +ve B.Sounds, Abd Soft, No tenderness, No organomegaly appriciated, No rebound - guarding or rigidity. No Cyanosis, Clubbing or edema, No new Rash or bruise   I have personally reviewed following labs and imaging studies  LABORATORY DATA: CBC: Recent Labs  Lab 12/01/16 0411 12/02/16 0350 12/03/16 0338 12/04/16 0342  WBC 3.5* 2.9* 2.7* 3.1*  NEUTROABS 3.0 2.2 2.0 2.4  HGB 8.6* 8.3* 8.0* 8.2*  HCT 26.8* 26.1* 25.1* 25.1*  MCV 76.1* 76.3* 76.8* 76.3*  PLT 52* 64* 65* 62*    Basic Metabolic Panel: Recent Labs  Lab 12/01/16 0411 12/02/16 0350 12/03/16 0338  NA 142 140 139  K 4.1 4.3 4.6  CL 103 101 102  CO2 _0 GLUCOSE 94 84 107*  BUN 38* 35* 33*  CREATININE 1.04 1.03 0.99  CALCIUM 8.3* 8.2* 8.2*    GFR: Estimated Creatinine Clearance: 50.5 mL/min (by C-G formula based on SCr of 0.99 mg/dL).  Liver Function Tests: Recent Labs  Lab 12/01/16 0411 12/02/16 0350 12/03/16 0338  AST 13* 12* 17  ALT 16* 15* 12*  ALKPHOS 42 39 39  BILITOT 0.6 0.7 0.9  PROT 5.6* 5.5* 5.4*  ALBUMIN 2.3* 2.2* 2.2*   No results for input(s): LIPASE, AMYLASE in the last 168 hours. No results for input(s): AMMONIA in the last 168 hours.  Coagulation Profile: No results for input(s): INR, PROTIME in the last 168 hours.  Cardiac Enzymes: No results for input(s): CKTOTAL, CKMB, CKMBINDEX, TROPONINI in the last 168 hours.  BNP  (last 3 results) No results for input(s): PROBNP in the last 8760 hours.  HbA1C: No results for input(s): HGBA1C in the last 72 hours.  CBG: Recent Labs  Lab 12/02/16 0740 12/03/16 0738 12/04/16 0755 12/06/16 0743 12/07/16 0752  GLUCAP 95 90 121* 98 102*    Lipid Profile: No results for input(s): CHOL, HDL, LDLCALC, TRIG, CHOLHDL, LDLDIRECT in the last 72 hours.  Thyroid Function Tests: No results for input(s): TSH, T4TOTAL, FREET4, T3FREE, THYROIDAB in the  last 72 hours.  Anemia Panel: No results for input(s): VITAMINB12, FOLATE, FERRITIN, TIBC, IRON, RETICCTPCT in the last 72 hours.  Urine analysis:    Component Value Date/Time   COLORURINE YELLOW 11/27/2016 2121   APPEARANCEUR CLOUDY (A) 11/27/2016 2121   LABSPEC 1.018 11/27/2016 2121   LABSPEC 1.025 05/01/2010 1454   PHURINE 5.0 11/27/2016 2121   GLUCOSEU NEGATIVE 11/27/2016 2121   HGBUR NEGATIVE 11/27/2016 2121   BILIRUBINUR NEGATIVE 11/27/2016 2121   BILIRUBINUR Negative 05/01/2010 1454   KETONESUR 5 (A) 11/27/2016 2121   PROTEINUR 30 (A) 11/27/2016 2121   UROBILINOGEN 0.2 06/07/2014 2018   NITRITE NEGATIVE 11/27/2016 2121   LEUKOCYTESUR NEGATIVE 11/27/2016 2121   LEUKOCYTESUR Negative 05/01/2010 1454    Sepsis Labs: Lactic Acid, Venous    Component Value Date/Time   LATICACIDVEN 0.9 11/10/2016 2033    MICROBIOLOGY: Recent Results (from the past 240 hour(s))  Culture, blood (Routine X 2) w Reflex to ID Panel     Status: None   Collection Time: 11/27/16  6:36 PM  Result Value Ref Range Status   Specimen Description BLOOD RIGHT ARM  Final   Special Requests   Final    BOTTLES DRAWN AEROBIC ONLY Blood Culture adequate volume   Culture   Final    NO GROWTH 5 DAYS Performed at [facility], [place] [address] [place], [place] [address]    Report Status 12/02/2016 FINAL  Final  Culture, blood (Routine X 2) w Reflex to ID Panel     Status: None   Collection Time: 11/27/16  6:36 PM  Result Value  Ref Range Status   Specimen Description BLOOD RIGHT HAND  Final   Special Requests IN PEDIATRIC BOTTLE Blood Culture adequate volume  Final   Culture   Final    NO GROWTH 5 DAYS Performed at [facility], [place] [address] [place], [place] [address]    Report Status 12/02/2016 FINAL  Final  Urine culture     Status: None   Collection Time: 11/27/16  9:21 PM  Result Value Ref Range Status   Specimen Description URINE, CATHETERIZED  Final   Special Requests NONE  Final   Culture   Final    NO GROWTH Performed at [facility], [place] [address] [place], [place] [address]    Report Status 11/29/2016 FINAL  Final  Respiratory Panel by PCR     Status: None   Collection Time: 11/28/16  1:35 PM  Result Value Ref Range Status   Adenovirus NOT DETECTED NOT DETECTED Final   Coronavirus 229E NOT DETECTED NOT DETECTED Final   Coronavirus HKU1 NOT DETECTED NOT DETECTED Final   Coronavirus NL63 NOT DETECTED NOT DETECTED Final   Coronavirus OC43 NOT DETECTED NOT DETECTED Final   Metapneumovirus NOT DETECTED NOT DETECTED Final   Rhinovirus / Enterovirus NOT DETECTED NOT DETECTED Final   Influenza A NOT DETECTED NOT DETECTED Final   Influenza B NOT DETECTED NOT DETECTED Final   Parainfluenza Virus 1 NOT DETECTED NOT DETECTED Final   Parainfluenza Virus 2 NOT DETECTED NOT DETECTED Final   Parainfluenza Virus 3 NOT DETECTED NOT DETECTED Final   Parainfluenza Virus 4 NOT DETECTED NOT DETECTED Final   Respiratory Syncytial Virus NOT DETECTED NOT DETECTED Final   Bordetella pertussis NOT DETECTED NOT DETECTED Final   Chlamydophila pneumoniae NOT DETECTED NOT DETECTED Final   Mycoplasma pneumoniae NOT DETECTED NOT DETECTED Final    Comment: Performed at [facility], [address] [place], [place] [address]    RADIOLOGY STUDIES/RESULTS: Dg Chest 2 View  Result Date: 11/30/2016 CLINICAL DATA:  Acute on chronic respiratory failure with hypercapnia. Ex-smoker. EXAM: CHEST  2 VIEW  COMPARISON:  Yesterday. FINDINGS: Normal sized heart. Tortuous aorta. Stable left subclavian pacemaker leads. Stable hyperexpansion of the lungs. Mild diffuse interstitial prominence in the right lung with mild progression. This includes Kerley lines in the lower lung zone. Clear left lung. Small amount of pleural fluid at the posterior lung bases on the lateral view. Mild bilateral shoulder degenerative changes. IMPRESSION: 1. Mildly increased interstitial lung disease on the right with some interstitial pulmonary edema and probable underlying interstitial pneumonitis. 2. Small right pleural effusion. 3. Stable changes of COPD. Electronically Signed   By: Claudie Revering M.D.   On: 11/30/2016 16:13   X-ray Chest Pa And Lateral  Result Date: 11/27/2016 CLINICAL DATA:  Increased weakness, fever and dyspnea over the past several days. EXAM: CHEST  2 VIEW COMPARISON:  11/10/2016. FINDINGS: Heart is within normal limits. Tortuous thoracic aorta with atherosclerosis. Left-sided ICD device projects over the shoulder with leads in the right atrium and right ventricle. Emphysematous hyperinflation of the lungs. Mild increase in vascular congestion is noted since previous study. No effusion or pneumothorax. No acute osseous abnormality. Degenerative changes are noted about the [facility] and glenohumeral joints. IMPRESSION: 1. COPD with slight increase in vascular congestion since prior exam. No pneumothorax or pneumonic consolidation. 2. Aortic atherosclerosis. Electronically Signed   By: Ashley Royalty M.D.   On: 11/27/2016 20:14   Ct Biopsy  Result Date: 12/03/2016 INDICATION: 83 year old male with a history of anemia EXAM: CT BIOPSY; CT BONE MARROW BIOPSY AND ASPIRATION MEDICATIONS: None. ANESTHESIA/SEDATION: Moderate (conscious) sedation was employed during this procedure. A total of Versed 1.0 mg and Fentanyl 50 mcg was administered intravenously. Moderate Sedation Time: 10 minutes. The patient's level of consciousness and  vital signs were monitored continuously by radiology nursing throughout the procedure under my direct supervision. FLUOROSCOPY TIME:  CT COMPLICATIONS: None PROCEDURE: The procedure risks, benefits, and alternatives were explained to the patient. Questions regarding the procedure were encouraged and answered. The patient understands and consents to the procedure. Scout CT of the pelvis was performed for surgical planning purposes. The posterior pelvis was prepped with Betadinein a sterile fashion, and a sterile drape was applied covering the operative field. A sterile gown and sterile gloves were used for the procedure. Local anesthesia was provided with 1% Lidocaine. We targeted the right posterior iliac bone for biopsy. The skin and subcutaneous tissues were infiltrated with 1% lidocaine without epinephrine. A small stab incision was made with an 11 blade scalpel, and an 11 gauge Murphy needle was advanced with CT guidance to the posterior cortex. Manual forced was used to advance the needle through the posterior cortex and the stylet was removed. A bone marrow aspirate was retrieved and passed to a cytotechnologist in the room. The Murphy needle was then advanced without the stylet for a core biopsy. The core biopsy was retrieved and also passed to a cytotechnologist. Manual pressure was used for hemostasis and a sterile dressing was placed. No complications were encountered no significant blood loss was encountered. Patient tolerated the procedure well and remained hemodynamically stable throughout. IMPRESSION: Status post CT-guided bone marrow biopsy, with tissue specimen sent to pathology for complete histopathologic analysis Signed, Dulcy Fanny. Earleen Newport, DO [facility] Electronically Signed   By: Corrie Mckusick D.O.  On: 12/03/2016 12:18   Dg Chest Port 1 View  Result Date: 12/02/2016 CLINICAL DATA:  Acute respiratory failure with hypoxia. EXAM:  PORTABLE CHEST 1 VIEW COMPARISON:  Radiographs of November 30, 2016. FINDINGS: Stable cardiomediastinal silhouette. Atherosclerosis of thoracic aorta is noted. Left-sided pacemaker is unchanged in position. Left lung is clear. Stable interstitial prominence is noted in the right lung which may represent scarring, although superimposed acute edema or inflammation cannot be excluded. Bony thorax is unremarkable. IMPRESSION: Aortic atherosclerosis. Stable interstitial prominence is noted in the right lung which may represent scarring, although superimposed acute edema or inflammation cannot be excluded. Electronically Signed   By: Marijo Conception, M.D.   On: 12/02/2016 10:16   Dg Chest Port 1 View  Result Date: 11/29/2016 CLINICAL DATA:  Acute respiratory failure with hypoxemia. Ex-smoker. EXAM: PORTABLE CHEST 1 VIEW COMPARISON:  11/27/2016 and 11/10/2016. FINDINGS: Stable left subclavian pacemaker leads. Normal sized heart. The aorta remains diffusely enlarged and tortuous a partially calcified. Stable hyperexpansion of the lungs. Increased interstitial markings in the right mid and upper lung zone compared to 11/10/2016. Diffuse osteopenia. Superior migration of both humeral heads with bony remodeling. IMPRESSION: 1. Mild interstitial lung disease in the right mid and upper lung zones, new since 11/10/2016. This has an appearance most compatible with interstitial pneumonitis. Asymmetrical interstitial pulmonary edema is less likely. 2. Stable changes of COPD. 3. Stable diffusely enlarged, tortuous and partially calcified thoracic aorta. 4. Bilateral large, chronic rotator cuff tears. Electronically Signed   By: Claudie Revering M.D.   On: 11/29/2016 07:52   Dg Chest Port 1 View  Result Date: 11/10/2016 CLINICAL DATA:  Hypoxia and anemia.  COPD exacerbation. EXAM: PORTABLE CHEST 1 VIEW COMPARISON:  10/29/2016 FINDINGS: Emphysematous hyperinflation of the lungs are again noted with minimal scarring at the left lung  base. No pneumonic consolidation or overt pulmonary edema. No pneumothorax. No pleural effusion. Heart size is within normal limits with right atrial pacer and right ventricular defibrillator leads in place. Left-sided ICD device projects over the shoulder. Tortuous aorta with aortic atherosclerosis, stable in appearance without aneurysm. Pulmonary vasculature is within normal limits. High-riding humeral heads again noted which may reflect chronic rotator cuff tears. IMPRESSION: 1. 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 2. Stable aortic atherosclerosis. 3. No acute pneumonic consolidation or CHF. Electronically Signed   By: Ashley Royalty M.D.   On: 11/10/2016 17:59   Dg Bone Survey Met  Result Date: 12/06/2016 CLINICAL DATA:  Myeloma EXAM: METASTATIC BONE SURVEY COMPARISON:  05/13/2010 FINDINGS: Skull: No lytic bone lesion. Right shoulder: Chronic rotator cuff tearing. AC joint degenerative change. No destructive bone lesion. Left shoulder:  No destructive bone lesion.  Osteopenia. Right humerus: No destructive bone lesion. Left humerus: No destructive bone lesion. Right forearm: No destructive bone lesion. Advanced degenerative change of the wrist with collapse of the proximal row and deformity of the scaphoid. Left forearm: No destructive bone lesion. Advanced degenerative change in the wrist with collapse of the proximal carpal row and deformity of the scaphoid. Cervical spine two views: No destructive bone lesion. Osteopenia. Advanced degenerative change. Thoracic spine two views: Slight dextroscoliosis in the midthoracic spine. No destructive bone lesion. Lumbar spine: Levoscoliosis at L1-2. No new vertebral compression deformity. No definite lytic bone lesion. Chest radiograph: Aorta is tortuous. Normal heart size. Scarring in the medial right mid lung. Left subclavian pacemaker device and AICD device is stable. Pelvis no focal destructive bone lesion.  Osteopenia. Right  femur:  No destructive bone lesion. Atherosclerotic calcifications in the femoral artery's. Left femur: No destructive bone lesion. Atherosclerotic calcifications. Colon right leg: No destructive bone lesion. Left leg: No destructive bone lesion. IMPRESSION: No destructive or lytic bone lesions are identified. Chronic changes are noted. Electronically Signed   By: Marybelle Killings M.D.   On: 12/06/2016 12:52   Ct Bone Marrow Biopsy & Aspiration  Result Date: 12/03/2016 INDICATION: 83 year old male with a history of anemia EXAM: CT BIOPSY; CT BONE MARROW BIOPSY AND ASPIRATION MEDICATIONS: None. ANESTHESIA/SEDATION: Moderate (conscious) sedation was employed during this procedure. A total of Versed 1.0 mg and Fentanyl 50 mcg was administered intravenously. Moderate Sedation Time: 10 minutes. The patient's level of consciousness and vital signs were monitored continuously by radiology nursing throughout the procedure under my direct supervision. FLUOROSCOPY TIME:  CT COMPLICATIONS: None PROCEDURE: The procedure risks, benefits, and alternatives were explained to the patient. Questions regarding the procedure were encouraged and answered. The patient understands and consents to the procedure. Scout CT of the pelvis was performed for surgical planning purposes. The posterior pelvis was prepped with Betadinein a sterile fashion, and a sterile drape was applied covering the operative field. A sterile gown and sterile gloves were used for the procedure. Local anesthesia was provided with 1% Lidocaine. We targeted the right posterior iliac bone for biopsy. The skin and subcutaneous tissues were infiltrated with 1% lidocaine without epinephrine. A small stab incision was made with an 11 blade scalpel, and an 11 gauge Murphy needle was advanced with CT guidance to the posterior cortex. Manual forced was used to advance the needle through the posterior cortex and the stylet was removed. A bone marrow aspirate was retrieved and  passed to a cytotechnologist in the room. The Murphy needle was then advanced without the stylet for a core biopsy. The core biopsy was retrieved and also passed to a cytotechnologist. Manual pressure was used for hemostasis and a sterile dressing was placed. No complications were encountered no significant blood loss was encountered. Patient tolerated the procedure well and remained hemodynamically stable throughout. IMPRESSION: Status post CT-guided bone marrow biopsy, with tissue specimen sent to pathology for complete histopathologic analysis Signed, Dulcy Fanny. Earleen Newport, DO [facility] Electronically Signed   By: Corrie Mckusick D.O.   On: 12/03/2016 12:18     LOS: 10 days   Signature  Lala Lund M.D on 12/07/2016 at 10:13 AM  Between 7am to 7pm - Pager - [phone] ( page via [email or web address], text pages only, please mention full 10 digit call back number).  After 7pm go to [email or web address] - password [facility]

## 2016-12-07 NOTE — Progress Notes (Addendum)
12/06/2016 6:10 pm Per Judithann Graves, Medicare appeal website all documents received and case is in clinical review. Jonnie Finner RN CCM Case Mgmt phone [phone]

## 2016-12-08 ENCOUNTER — Ambulatory Visit (HOSPITAL_COMMUNITY): Payer: Medicare Other

## 2016-12-08 ENCOUNTER — Telehealth: Payer: Self-pay | Admitting: Pharmacist

## 2016-12-08 DIAGNOSIS — D649 Anemia, unspecified: Secondary | ICD-10-CM | POA: Diagnosis not present

## 2016-12-08 DIAGNOSIS — E785 Hyperlipidemia, unspecified: Secondary | ICD-10-CM | POA: Diagnosis not present

## 2016-12-08 DIAGNOSIS — D61818 Other pancytopenia: Secondary | ICD-10-CM | POA: Diagnosis not present

## 2016-12-08 DIAGNOSIS — J9621 Acute and chronic respiratory failure with hypoxia: Secondary | ICD-10-CM | POA: Diagnosis not present

## 2016-12-08 DIAGNOSIS — K219 Gastro-esophageal reflux disease without esophagitis: Secondary | ICD-10-CM | POA: Diagnosis not present

## 2016-12-08 DIAGNOSIS — J441 Chronic obstructive pulmonary disease with (acute) exacerbation: Secondary | ICD-10-CM | POA: Diagnosis not present

## 2016-12-08 DIAGNOSIS — L298 Other pruritus: Secondary | ICD-10-CM | POA: Diagnosis not present

## 2016-12-08 DIAGNOSIS — C9000 Multiple myeloma not having achieved remission: Secondary | ICD-10-CM

## 2016-12-08 DIAGNOSIS — D508 Other iron deficiency anemias: Secondary | ICD-10-CM | POA: Diagnosis not present

## 2016-12-08 DIAGNOSIS — F419 Anxiety disorder, unspecified: Secondary | ICD-10-CM | POA: Diagnosis not present

## 2016-12-08 DIAGNOSIS — K5900 Constipation, unspecified: Secondary | ICD-10-CM | POA: Diagnosis not present

## 2016-12-08 DIAGNOSIS — J9600 Acute respiratory failure, unspecified whether with hypoxia or hypercapnia: Secondary | ICD-10-CM | POA: Diagnosis not present

## 2016-12-08 DIAGNOSIS — G4709 Other insomnia: Secondary | ICD-10-CM | POA: Diagnosis not present

## 2016-12-08 DIAGNOSIS — E538 Deficiency of other specified B group vitamins: Secondary | ICD-10-CM | POA: Diagnosis not present

## 2016-12-08 DIAGNOSIS — I5032 Chronic diastolic (congestive) heart failure: Secondary | ICD-10-CM | POA: Diagnosis not present

## 2016-12-08 DIAGNOSIS — J9601 Acute respiratory failure with hypoxia: Secondary | ICD-10-CM | POA: Diagnosis not present

## 2016-12-08 DIAGNOSIS — R52 Pain, unspecified: Secondary | ICD-10-CM | POA: Diagnosis not present

## 2016-12-08 DIAGNOSIS — R4182 Altered mental status, unspecified: Secondary | ICD-10-CM | POA: Diagnosis not present

## 2016-12-08 DIAGNOSIS — D696 Thrombocytopenia, unspecified: Secondary | ICD-10-CM | POA: Diagnosis not present

## 2016-12-08 DIAGNOSIS — I2510 Atherosclerotic heart disease of native coronary artery without angina pectoris: Secondary | ICD-10-CM | POA: Diagnosis not present

## 2016-12-08 DIAGNOSIS — I209 Angina pectoris, unspecified: Secondary | ICD-10-CM | POA: Diagnosis not present

## 2016-12-08 DIAGNOSIS — R339 Retention of urine, unspecified: Secondary | ICD-10-CM | POA: Diagnosis not present

## 2016-12-08 DIAGNOSIS — E569 Vitamin deficiency, unspecified: Secondary | ICD-10-CM | POA: Diagnosis not present

## 2016-12-08 DIAGNOSIS — M6281 Muscle weakness (generalized): Secondary | ICD-10-CM | POA: Diagnosis not present

## 2016-12-08 DIAGNOSIS — N390 Urinary tract infection, site not specified: Secondary | ICD-10-CM | POA: Diagnosis not present

## 2016-12-08 DIAGNOSIS — D472 Monoclonal gammopathy: Secondary | ICD-10-CM | POA: Diagnosis not present

## 2016-12-08 LAB — GLUCOSE, CAPILLARY: Glucose-Capillary: 89 mg/dL (ref 65–99)

## 2016-12-08 MED ORDER — TRIMETHOPRIM 100 MG PO TABS: 100.0000 mg | ORAL_TABLET | Freq: Every day | ORAL | Status: DC

## 2016-12-08 MED ORDER — TRIMETHOPRIM 100 MG PO TABS
100.0000 mg | ORAL_TABLET | Freq: Every day | ORAL | Status: DC
  Administered 2016-12-08: 100 mg via ORAL
  Filled 2016-12-08: qty 1

## 2016-12-08 NOTE — Telephone Encounter (Signed)
Oral Oncology Pharmacist Encounter  Received new referrals for Ninlaro (ixazomib) and Revlimid (lenalidomide) for the treatment of multiple myeloma in conjunction with dexamethasone, planned duration reassess after 4-6 cycles.  CBC from 12/04/16 shows anemia (Hgb=8.2) and thrombocytopenia (pltc=62k). CMET from 12/03/16 shows no hepatic dysfunction, SCr=0.99, est CrCl ~45 mL/min.  Noted patient with ICM/Chronic systolic CHF, s/p ICD placement in 2011 LVEF 55-60% on 06/06/15  QTc 431 msec on 11/10/16  Revlimid has been determined by the [place], to be an agent that may either cause direct myocardial toxicity or exacerbate underlying myocardial dysfunction Proteasome inhibition has also been shown to induce or exacerbate cardiac dysfunction. Patient will be monitored.  Current medication list in Epic reviewed, no DDIs with Ninlaro or Revlimid identified. Noted patient on ASA '81mg'$  daily, this will be adequate for thromboprophylaxis for the use of Revlimd/dexamethasone  No acyclovir for VZV reactivation prophylaxis noted on medication list. It is recommended to be used with the proteasome inhibitors (Velcade and Ninlaro), this will be discussed with MD.  Discussion with MD. Patient with underlying peripheral neuropathy, therefore will pursue initial treatment with Revlimid / dexamethasone without proteasome inhibition. Will receive prescription for Revlimid after patient enrolled in [facility] program by collaborative practice RN.  Oral Oncology Clinic will continue to follow for insurance authorization, copayment issues, initial counseling and start date.  Johny Drilling, PharmD, BCPS, BCOP 12/08/2016 10:56 AM Oral Oncology Clinic [phone]

## 2016-12-08 NOTE — Progress Notes (Signed)
Physical Therapy Treatment Patient Details Name: [name] Gieske, MD MRN: [id number] DOB: November 03, 1925 Today's Date: 12/08/2016    History of Present Illness Pt is a 83 y/o male admited with acute on chronic resp failure PMH includes DM, CKD, MI, COPD, CAD, CHF, and ischemic cardiomyopathy; this is his 3rd hospital admission in ~ 2 mos.  Patient s/p CT guided aspirate and core biopsy of right posterior iliac bone on 12/03/16    PT Comments    Pt performed a couple seated exercises and ambulated in room with RW.  Pt remained on 4L O2 [place].  Pt reports d/c to SNF later today.   Follow Up Recommendations  Home health PT;Supervision for mobility/OOB     Equipment Recommendations  None recommended by PT    Recommendations for Other Services       Precautions / Restrictions Precautions Precautions: Fall Precaution Comments: Home O2 @ 2.5L to 3L    Mobility  Bed Mobility               General bed mobility comments: pt up in recliner  Transfers Overall transfer level: Needs assistance Equipment used: Rolling walker (2 wheeled) Transfers: Sit to/from Stand Sit to Stand: Min guard         General transfer comment: verbal cues for hand placement  Ambulation/Gait Ambulation/Gait assistance: Min guard Ambulation Distance (Feet): 32 Feet Assistive device: Rolling walker (2 wheeled) Gait Pattern/deviations: Decreased stride length;Trunk flexed     General Gait Details: verbal cues for RW positinoing and posture, remained on 4L O2 [place] and SPO2 96% after ambulating   Stairs            Wheelchair Mobility    Modified Rankin (Stroke Patients Only)       Balance                                            Cognition Arousal/Alertness: Awake/alert Behavior During Therapy: WFL for tasks assessed/performed Overall Cognitive Status: Within Functional Limits for tasks assessed                                 General Comments: HOH       Exercises General Exercises - Upper Extremity Shoulder Horizontal ABduction: AROM;Both;10 reps;Seated Shoulder Horizontal ADduction: AROM;Both;10 reps;Seated General Exercises - Lower Extremity Ankle Circles/Pumps: AROM;Both;15 reps;Seated Long Arc Quad: AROM;Both;10 reps;Seated Hip Flexion/Marching: AROM;Both;10 reps;Seated    General Comments        Pertinent Vitals/Pain Pain Assessment: No/denies pain Faces Pain Scale: Hurts little more Pain Location: "bottom"  Pain Intervention(s): Repositioned    Home Living                      Prior Function            PT Goals (current goals can now be found in the care plan section) Progress towards PT goals: Progressing toward goals    Frequency    Min 3X/week      PT Plan Current plan remains appropriate    Co-evaluation              AM-PAC PT "6 Clicks" Daily Activity  Outcome Measure  Difficulty turning over in bed (including adjusting bedclothes, sheets and blankets)?: A Little Difficulty moving from lying on back to sitting on the side  of the bed? : A Little Difficulty sitting down on and standing up from a chair with arms (e.g., wheelchair, bedside commode, etc,.)?: A Little Help needed moving to and from a bed to chair (including a wheelchair)?: A Little Help needed walking in hospital room?: A Little Help needed climbing 3-5 steps with a railing? : A Lot 6 Click Score: 17    End of Session Equipment Utilized During Treatment: Gait belt;Oxygen Activity Tolerance: Patient tolerated treatment well Patient left: in chair;with call bell/phone within reach;with chair alarm set   PT Visit Diagnosis: Unsteadiness on feet (R26.81);Muscle weakness (generalized) (M62.81)     Time: 2671-2458 PT Time Calculation (min) (ACUTE ONLY): 16 min  Charges:  $Gait Training: 8-22 mins                    G Codes:       Carmelia Bake, PT, DPT 12/08/2016 Pager: [phone]  York Ram E 12/08/2016,  5:11 PM

## 2016-12-08 NOTE — Progress Notes (Signed)
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 Sex: male Date of Birth: [birth year] Admit Date: 11/27/2016 Admitting Physician Javier Glazier, MD PXT:[id number], Denton Ar, MD  Brief Narrative:  Patient is a 83 y.o. Male with past medical history of severe COPD, on home O2, chronic iron deficiency anemia, recent hospitalizations for COPD exacerbation/pneumonia, admitted for fever and weakness. The patient was admitted by pulmonary critical care to the ICU, he was given steroids, bronchodilators. He was found to have pancytopenia. Plans are to pursue any bone marrow biopsy today.   Was actually discharged on 12/05/2016, I was informed by the staff that daughter had called the nursing home on 12/05/2016 and told them that she would try to extend the discharge until Monday or Tuesday as much as possible.  I went to see the patient on 12/06/2016 first day of assuming his care, daughter asked me can I keep the patient here till Tuesday to which I responded that I have to evaluate the patient first before I make my decision.  After examining my opinion was that patient is stable to be discharged to SNF.  Patient proceeded to call me another [place] doctor.  Daughter then proceeded to ask if I could provide 1 week of hospital medications when he goes to SNF to which I said I will have the pharmacy, and talk to her and if they are able to do so I would be more than happy to prescribe.  She then asked me to call patient's ophthalmologist Dr. Sherlynn Stalls for macular degeneration to which I said this was nonurgent can be done outpatient.  She then asked me his prognosis from underlying long-standing COPD which I said was poor and is not been a change, she then asked me that she does not know about the hematologist Dr. Gearldine Shown plan and would like to stay Monday or Tuesday to know about the plan, I reviewed the chart and told her about oncology's plan of  seeing him in the office on Tuesday and then deciding on treatment.  She then told me that she has planned to appeal the discharge as she does not want the patient to go to SNF due to lack of support staff at SNF over the weekend.   He remains discharged from the hospital, discharge was done on 12/05/2016.  No new issues.  Patient is asymptomatic although poor baseline and poor long-term prognosis due to extremely advanced baseline COPD and advanced age.   Subjective:  Patient in bed, appears comfortable, denies any headache, no fever, no chest pain or pressure, no shortness of breath , no abdominal pain. No focal weakness.  Assessment/Plan:  Acute on chronic hypoxic respiratory failure due to acute on chronic COPD exacerbation.  On 3 L nasal cannula oxygen at baseline due to advanced COPD, he follows with Dr. Creig Hines:   Poor long-term prognosis and baseline, he has had multiple hospital admissions recently, has finished steroid treatment, currently close to baseline, encouraged to sit up in chair and use flutter valve for pulmonary toiletry.  Medically stable and was discharged to SNF on 12/05/2016.   However family reluctant to take him to SNF as they think there is lack of staffing at SNF.  The social worker and case management informed.  Continue supportive care with oxygen nebulizer treatments, encouraged to  sit up in the chair and use flutter valve for pulmonary toiletry.  They have been suggested to consider palliative care per previous physician a few days ago.   Pancytopenia: Has had worsening pancytopenia over the past few months, per hematology patient has a thalassemia trait, MGUS-but worsening pancytopenia felt to be probably due to her underlying myelodysplastic disorder. Underwent bone marrow biopsy on 11/7, results look consistent with multiple myeloma reviewed oncology's note patient to follow in the office on 12/09/2016 with possible initiation of medical treatment.  History of  chronic diastolic heart failure: Compensated, continue metoprolol.   History of CAD: Currently without chest pain, continue  statin and metoprolol. 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  Dyslipidemia: Continue statin  Anxiety: Currently appears to be stable, continue as needed Xanax.  GERD: Continue PPI.  Chronic urinary retention.  At baseline self caths himself every 4-6 hours, currently has Foley and refusing to take it out, patient and family adequately warned about possible UTI.  He takes daily trimethoprim at home for UTI prevention which has been resumed.  Extensively counseled to remove Foley catheter in the presence of daughter bedside on 12/08/2016 .   Debility/frailty/failure to thrive syndrome: 83 year old male with advanced COPD on home O2-with numerous recent hospitalizations-with possible myelodysplastic syndrome-(awaiting bone marrow biopsy results)-admitted again with worsening shortness of breath, felt to be viral bronchitis causing COPD exacerbation. He is incredibly frail, and chronically sick appearing- although anemia may be playing a role in worsening shortness of breath, I think that advanced age/debility and severe COPD are also contributing significantly to his overall poor health. Is anything he has significant stress worrisome findings on bone marrow biopsy, given his overall health/frailty-it will not change any short-term management, glucose he clearly is not a candidate at this time for aggressive care. DO NOT RESUSCITATE is in place, family is aware of for long-term prognoses. He will probably require palliative care follow-up at SNF. If in the future, he decompensates or continues to have recurrent hospitalizations in spite of maximal cares for support, it may be prudent to engage palliative care services as well.   DC Meds  Allergies as of  12/08/2016   No Known Allergies     Medication List    STOP taking these medications   clopidogrel 75 MG tablet Commonly known as:  PLAVIX   predniSONE 20 MG tablet Commonly known as:  DELTASONE     TAKE these medications   albuterol (2.5 MG/3ML) 0.083% nebulizer solution Commonly known as:  PROVENTIL Take 3 mLs (2.5 mg total) every 2 (two) hours as needed by nebulization for wheezing or shortness of breath. What changed:  when to take this   ALPRAZolam 0.25 MG tablet Commonly known as:  XANAX Take 1 tablet (0.25 mg total) at bedtime as needed by mouth for sleep.   arformoterol 15 MCG/2ML Nebu Commonly known as:  BROVANA Take 15 mcg by nebulization 2 (two) times daily.   ascorbic acid 250 MG tablet Commonly known as:  VITAMIN C Take 1 tablet (250 mg total) by mouth 2 (two) times daily.   aspirin EC 81 MG tablet Take 1 tablet (81 mg total) by mouth at bedtime.   bisacodyl 5 MG EC tablet Commonly known as:  DULCOLAX Take 5 mg by mouth daily as needed for moderate constipation.   budesonide 0.5 MG/2ML nebulizer solution Commonly known as:  PULMICORT  Take 2 mLs (0.5 mg total) by nebulization 2 (two) times daily.   cyanocobalamin 1000 MCG tablet Take 1 tablet (1,000 mcg total) daily by mouth.   ferrous sulfate 325 (65 FE) MG tablet Take 1 tablet (325 mg total) by mouth 2 (two) times daily with a meal.   fluticasone 50 MCG/ACT nasal spray Commonly known as:  FLONASE Place 1 spray into both nostrils daily.   metoprolol tartrate 25 MG tablet Commonly known as:  LOPRESSOR Take 12.5 mg by mouth 2 (two) times daily. Reported on 06/06/2015   MUCINEX 600 MG 12 hr tablet Generic drug:  guaiFENesin Take 600 mg by mouth 2 (two) times daily as needed for cough or to loosen phlegm.   nitroGLYCERIN 0.4 MG SL tablet Commonly known as:  NITROSTAT Place 1 tablet (0.4 mg total) under the tongue every 5 (five) minutes as needed for chest pain. UP TO 3 DOSES THEN CALL EMS     OXYGEN Inhale 2 L into the lungs at bedtime.   pantoprazole 40 MG tablet Commonly known as:  PROTONIX Take 1 tablet (40 mg total) by mouth daily.   rosuvastatin 5 MG tablet Commonly known as:  CRESTOR Take 1 tablet (5 mg total) by mouth daily at 6 PM. What changed:  when to take this   sodium chloride 0.65 % Soln nasal spray Commonly known as:  OCEAN Place 2 sprays into both nostrils 2 (two) times daily.   trimethoprim 100 MG tablet Commonly known as:  TRIMPEX Take 1 tablet (100 mg total) daily by mouth.      DVT Prophylaxis: SCD's  Code Status: DNR  Family Communication: Spoke with patient's daughter bedside  Disposition Plan: Remain inpatient-SNF soon.  Antimicrobial agents: Anti-infectives (From admission, onward)   Start     Dose/Rate Route Frequency Ordered Stop   12/08/16 0900  trimethoprim (TRIMPEX) tablet 100 mg     100 mg Oral Daily 12/08/16 0824     11/28/16 2000  vancomycin (VANCOCIN) IVPB 1000 mg/200 mL premix  Status:  Discontinued     1,000 mg 200 mL/hr over 60 Minutes Intravenous Every 24 hours 11/27/16 1741 11/27/16 2016   11/28/16 2000  vancomycin (VANCOCIN) IVPB 750 mg/150 ml premix  Status:  Discontinued     750 mg 150 mL/hr over 60 Minutes Intravenous Every 24 hours 11/27/16 2016 11/28/16 1049   11/27/16 1830  vancomycin (VANCOCIN) 1,500 mg in sodium chloride 0.9 % 500 mL IVPB     1,500 mg 250 mL/hr over 120 Minutes Intravenous  Once 11/27/16 1741 11/27/16 2129   11/27/16 1800  ceFEPIme (MAXIPIME) 2 g in dextrose 5 % 50 mL IVPB  Status:  Discontinued     2 g 100 mL/hr over 30 Minutes Intravenous Every 24 hours 11/27/16 1712 11/28/16 1131      Procedures: None  CONSULTS:  pulmonary/intensive care and hematology/oncology  Time spent: 25 minutes-Greater than 50% of this time was spent in counseling, explanation of diagnosis, planning of further management, and coordination of care.  MEDICATIONS: Scheduled Meds: . albuterol  2.5 mg  Nebulization Q6H  . arformoterol  15 mcg Nebulization BID  . budesonide  0.5 mg Nebulization BID  . fluticasone  1 spray Each Nare Daily  . guaiFENesin  600 mg Oral BID  . metoprolol tartrate  12.5 mg Oral BID  . pantoprazole  40 mg Oral Daily  . rosuvastatin  5 mg Oral Daily  . trimethoprim  100 mg Oral Daily  . vitamin B-12  1,000 mcg Oral Daily  . ascorbic acid  250 mg Oral BID   Continuous Infusions:  PRN Meds:.acetaminophen **OR** [DISCONTINUED] acetaminophen, albuterol, ALPRAZolam, docusate sodium, magnesium hydroxide, menthol-cetylpyridinium, ondansetron **OR** [DISCONTINUED] ondansetron (ZOFRAN) IV, senna-docusate   PHYSICAL EXAM: Vital signs: Vitals:   12/07/16 2052 12/07/16 2109 12/08/16 0608 12/08/16 0746  BP:  123/69 113/69   Pulse:  86 72   Resp:  20 20   Temp:  98.7 F (37.1 C) 98.2 F (36.8 C)   TempSrc:  Oral Oral   SpO2: 100% 99% 99% 94%  Weight:   67.3 kg (148 lb 5.9 oz)   Height:       Filed Weights   12/03/16 0350 12/04/16 0500 12/08/16 0608  Weight: 70.8 kg (156 lb 1.4 oz) 70.6 kg (155 lb 10.3 oz) 67.3 kg (148 lb 5.9 oz)   Body mass index is 20.12 kg/m.   Exam  Awake Alert, No new F.N deficits, Normal affect [place].AT,PERRAL Supple Neck,No JVD, No cervical lymphadenopathy appriciated.  Symmetrical Chest wall movement, Mod air movement bilaterally, few wheezes RRR,No Gallops, Rubs or new Murmurs, No Parasternal Heave +ve B.Sounds, Abd Soft, No tenderness, No organomegaly appriciated, No rebound - guarding or rigidity. No Cyanosis, Clubbing or edema, No new Rash or bruise   I have personally reviewed following labs and imaging studies  LABORATORY DATA: CBC: Recent Labs  Lab 12/02/16 0350 12/03/16 0338 12/04/16 0342  WBC 2.9* 2.7* 3.1*  NEUTROABS 2.2 2.0 2.4  HGB 8.3* 8.0* 8.2*  HCT 26.1* 25.1* 25.1*  MCV 76.3* 76.8* 76.3*  PLT 64* 65* 62*    Basic Metabolic Panel: Recent Labs  Lab 12/02/16 0350 12/03/16 0338  NA 140 139  K 4.3  4.6  CL 101 102  CO2 28 28  GLUCOSE 84 107*  BUN 35* 33*  CREATININE 1.03 0.99  CALCIUM 8.2* 8.2*    GFR: Estimated Creatinine Clearance: 48.2 mL/min (by C-G formula based on SCr of 0.99 mg/dL).  Liver Function Tests: Recent Labs  Lab 12/02/16 0350 12/03/16 0338  AST 12* 17  ALT 15* 12*  ALKPHOS 39 39  BILITOT 0.7 0.9  PROT 5.5* 5.4*  ALBUMIN 2.2* 2.2*   No results for input(s): LIPASE, AMYLASE in the last 168 hours. No results for input(s): AMMONIA in the last 168 hours.  Coagulation Profile: No results for input(s): INR, PROTIME in the last 168 hours.  Cardiac Enzymes: No results for input(s): CKTOTAL, CKMB, CKMBINDEX, TROPONINI in the last 168 hours.  BNP (last 3 results) No results for input(s): PROBNP in the last 8760 hours.  HbA1C: No results for input(s): HGBA1C in the last 72 hours.  CBG: Recent Labs  Lab 12/03/16 0738 12/04/16 0755 12/06/16 0743 12/07/16 0752 12/08/16 0737  GLUCAP 90 121* 98 102* 89    Lipid Profile: No results for input(s): CHOL, HDL, LDLCALC, TRIG, CHOLHDL, LDLDIRECT in the last 72 hours.  Thyroid Function Tests: No results for input(s): TSH, T4TOTAL, FREET4, T3FREE, THYROIDAB in the last 72 hours.  Anemia Panel: No results for input(s): VITAMINB12, FOLATE, FERRITIN, TIBC, IRON, RETICCTPCT in the last 72 hours.  Urine analysis:    Component Value Date/Time   COLORURINE YELLOW 11/27/2016 2121   APPEARANCEUR CLOUDY (A) 11/27/2016 2121   LABSPEC 1.018 11/27/2016 2121   LABSPEC 1.025 05/01/2010 1454   PHURINE 5.0 11/27/2016 2121   GLUCOSEU NEGATIVE 11/27/2016 2121   HGBUR NEGATIVE 11/27/2016 2121   BILIRUBINUR NEGATIVE 11/27/2016 2121   BILIRUBINUR Negative 05/01/2010 1454   KETONESUR  5 (A) 11/27/2016 2121   PROTEINUR 30 (A) 11/27/2016 2121   UROBILINOGEN 0.2 06/07/2014 2018   NITRITE NEGATIVE 11/27/2016 2121   LEUKOCYTESUR NEGATIVE 11/27/2016 2121   LEUKOCYTESUR Negative 05/01/2010 1454    Sepsis Labs: Lactic  Acid, Venous    Component Value Date/Time   LATICACIDVEN 0.9 11/10/2016 2033    MICROBIOLOGY: Recent Results (from the past 240 hour(s))  Respiratory Panel by PCR     Status: None   Collection Time: 11/28/16  1:35 PM  Result Value Ref Range Status   Adenovirus NOT DETECTED NOT DETECTED Final   Coronavirus 229E NOT DETECTED NOT DETECTED Final   Coronavirus HKU1 NOT DETECTED NOT DETECTED Final   Coronavirus NL63 NOT DETECTED NOT DETECTED Final   Coronavirus OC43 NOT DETECTED NOT DETECTED Final   Metapneumovirus NOT DETECTED NOT DETECTED Final   Rhinovirus / Enterovirus NOT DETECTED NOT DETECTED Final   Influenza A NOT DETECTED NOT DETECTED Final   Influenza B NOT DETECTED NOT DETECTED Final   Parainfluenza Virus 1 NOT DETECTED NOT DETECTED Final   Parainfluenza Virus 2 NOT DETECTED NOT DETECTED Final   Parainfluenza Virus 3 NOT DETECTED NOT DETECTED Final   Parainfluenza Virus 4 NOT DETECTED NOT DETECTED Final   Respiratory Syncytial Virus NOT DETECTED NOT DETECTED Final   Bordetella pertussis NOT DETECTED NOT DETECTED Final   Chlamydophila pneumoniae NOT DETECTED NOT DETECTED Final   Mycoplasma pneumoniae NOT DETECTED NOT DETECTED Final    Comment: Performed at [facility], [place] [address], [place], [place] [address]    RADIOLOGY STUDIES/RESULTS: Dg Chest 2 View  Result Date: 11/30/2016 CLINICAL DATA:  Acute on chronic respiratory failure with hypercapnia. Ex-smoker. EXAM: CHEST  2 VIEW COMPARISON:  Yesterday. FINDINGS: Normal sized heart. Tortuous aorta. Stable left subclavian pacemaker leads. Stable hyperexpansion of the lungs. Mild diffuse interstitial prominence in the right lung with mild progression. This includes Kerley lines in the lower lung zone. Clear left lung. Small amount of pleural fluid at the posterior lung bases on the lateral view. Mild bilateral shoulder degenerative changes. IMPRESSION: 1. Mildly increased interstitial lung disease on the right with some  interstitial pulmonary edema and probable underlying interstitial pneumonitis. 2. Small right pleural effusion. 3. Stable changes of COPD. Electronically Signed   By: Claudie Revering M.D.   On: 11/30/2016 16:13   X-ray Chest Pa And Lateral  Result Date: 11/27/2016 CLINICAL DATA:  Increased weakness, fever and dyspnea over the past several days. EXAM: CHEST  2 VIEW COMPARISON:  11/10/2016. FINDINGS: Heart is within normal limits. Tortuous thoracic aorta with atherosclerosis. Left-sided ICD device projects over the shoulder with leads in the right atrium and right ventricle. Emphysematous hyperinflation of the lungs. Mild increase in vascular congestion is noted since previous study. No effusion or pneumothorax. No acute osseous abnormality. Degenerative changes are noted about the [facility] and glenohumeral joints. IMPRESSION: 1. COPD with slight increase in vascular congestion since prior exam. No pneumothorax or pneumonic consolidation. 2. Aortic atherosclerosis. Electronically Signed   By: Ashley Royalty M.D.   On: 11/27/2016 20:14   Ct Biopsy  Result Date: 12/03/2016 INDICATION: 83 year old male with a history of anemia EXAM: CT BIOPSY; CT BONE MARROW BIOPSY AND ASPIRATION MEDICATIONS: None. ANESTHESIA/SEDATION: Moderate (conscious) sedation was employed during this procedure. A total of Versed 1.0 mg and Fentanyl 50 mcg was administered intravenously. Moderate Sedation Time: 10 minutes. The patient's level of consciousness and vital signs were monitored continuously by radiology nursing throughout the procedure under my  direct supervision. FLUOROSCOPY TIME:  CT COMPLICATIONS: None PROCEDURE: The procedure risks, benefits, and alternatives were explained to the patient. Questions regarding the procedure were encouraged and answered. The patient understands and consents to the procedure. Scout CT of the pelvis was performed for surgical planning purposes. The posterior pelvis was prepped with Betadinein a sterile  fashion, and a sterile drape was applied covering the operative field. A sterile gown and sterile gloves were used for the procedure. Local anesthesia was provided with 1% Lidocaine. We targeted the right posterior iliac bone for biopsy. The skin and subcutaneous tissues were infiltrated with 1% lidocaine without epinephrine. A small stab incision was made with an 11 blade scalpel, and an 11 gauge Murphy needle was advanced with CT guidance to the posterior cortex. Manual forced was used to advance the needle through the posterior cortex and the stylet was removed. A bone marrow aspirate was retrieved and passed to a cytotechnologist in the room. The Murphy needle was then advanced without the stylet for a core biopsy. The core biopsy was retrieved and also passed to a cytotechnologist. Manual pressure was used for hemostasis and a sterile dressing was placed. No complications were encountered no significant blood loss was encountered. Patient tolerated the procedure well and remained hemodynamically stable throughout. IMPRESSION: Status post CT-guided bone marrow biopsy, with tissue specimen sent to pathology for complete histopathologic analysis Signed, Dulcy Fanny. Earleen Newport, DO [facility] Electronically Signed   By: Corrie Mckusick D.O.   On: 12/03/2016 12:18   Dg Chest Port 1 View  Result Date: 12/02/2016 CLINICAL DATA:  Acute respiratory failure with hypoxia. EXAM: PORTABLE CHEST 1 VIEW COMPARISON:  Radiographs of November 30, 2016. FINDINGS: Stable cardiomediastinal silhouette. Atherosclerosis of thoracic aorta is noted. Left-sided pacemaker is unchanged in position. Left lung is clear. Stable interstitial prominence is noted in the right lung which may represent scarring, although superimposed acute edema or inflammation cannot be excluded. Bony thorax is unremarkable. IMPRESSION: Aortic atherosclerosis. Stable interstitial prominence is noted in the  right lung which may represent scarring, although superimposed acute edema or inflammation cannot be excluded. Electronically Signed   By: Marijo Conception, M.D.   On: 12/02/2016 10:16   Dg Chest Port 1 View  Result Date: 11/29/2016 CLINICAL DATA:  Acute respiratory failure with hypoxemia. Ex-smoker. EXAM: PORTABLE CHEST 1 VIEW COMPARISON:  11/27/2016 and 11/10/2016. FINDINGS: Stable left subclavian pacemaker leads. Normal sized heart. The aorta remains diffusely enlarged and tortuous a partially calcified. Stable hyperexpansion of the lungs. Increased interstitial markings in the right mid and upper lung zone compared to 11/10/2016. Diffuse osteopenia. Superior migration of both humeral heads with bony remodeling. IMPRESSION: 1. Mild interstitial lung disease in the right mid and upper lung zones, new since 11/10/2016. This has an appearance most compatible with interstitial pneumonitis. Asymmetrical interstitial pulmonary edema is less likely. 2. Stable changes of COPD. 3. Stable diffusely enlarged, tortuous and partially calcified thoracic aorta. 4. Bilateral large, chronic rotator cuff tears. Electronically Signed   By: Claudie Revering M.D.   On: 11/29/2016 07:52   Dg Chest Port 1 View  Result Date: 11/10/2016 CLINICAL DATA:  Hypoxia and anemia.  COPD exacerbation. EXAM: PORTABLE CHEST 1 VIEW COMPARISON:  10/29/2016 FINDINGS: Emphysematous hyperinflation of the lungs are again noted with minimal scarring at the left lung base. No pneumonic consolidation or overt pulmonary edema. No pneumothorax. No pleural effusion. Heart size is within normal limits with right atrial pacer and right ventricular defibrillator  leads in place. Left-sided ICD device projects over the shoulder. Tortuous aorta with aortic atherosclerosis, stable in appearance without aneurysm. Pulmonary vasculature is within normal limits. High-riding humeral heads again noted which may reflect chronic rotator cuff tears. IMPRESSION: 1.  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 2. Stable aortic atherosclerosis. 3. No acute pneumonic consolidation or CHF. Electronically Signed   By: Ashley Royalty M.D.   On: 11/10/2016 17:59   Dg Bone Survey Met  Result Date: 12/06/2016 CLINICAL DATA:  Myeloma EXAM: METASTATIC BONE SURVEY COMPARISON:  05/13/2010 FINDINGS: Skull: No lytic bone lesion. Right shoulder: Chronic rotator cuff tearing. AC joint degenerative change. No destructive bone lesion. Left shoulder:  No destructive bone lesion.  Osteopenia. Right humerus: No destructive bone lesion. Left humerus: No destructive bone lesion. Right forearm: No destructive bone lesion. Advanced degenerative change of the wrist with collapse of the proximal row and deformity of the scaphoid. Left forearm: No destructive bone lesion. Advanced degenerative change in the wrist with collapse of the proximal carpal row and deformity of the scaphoid. Cervical spine two views: No destructive bone lesion. Osteopenia. Advanced degenerative change. Thoracic spine two views: Slight dextroscoliosis in the midthoracic spine. No destructive bone lesion. Lumbar spine: Levoscoliosis at L1-2. No new vertebral compression deformity. No definite lytic bone lesion. Chest radiograph: Aorta is tortuous. Normal heart size. Scarring in the medial right mid lung. Left subclavian pacemaker device and AICD device is stable. Pelvis no focal destructive bone lesion.  Osteopenia. Right femur: No destructive bone lesion. Atherosclerotic calcifications in the femoral artery's. Left femur: No destructive bone lesion. Atherosclerotic calcifications. Colon right leg: No destructive bone lesion. Left leg: No destructive bone lesion. IMPRESSION: No destructive or lytic bone lesions are identified. Chronic changes are noted. Electronically Signed   By: Marybelle Killings M.D.   On: 12/06/2016 12:52   Ct Bone Marrow Biopsy & Aspiration  Result Date: 12/03/2016 INDICATION:  83 year old male with a history of anemia EXAM: CT BIOPSY; CT BONE MARROW BIOPSY AND ASPIRATION MEDICATIONS: None. ANESTHESIA/SEDATION: Moderate (conscious) sedation was employed during this procedure. A total of Versed 1.0 mg and Fentanyl 50 mcg was administered intravenously. Moderate Sedation Time: 10 minutes. The patient's level of consciousness and vital signs were monitored continuously by radiology nursing throughout the procedure under my direct supervision. FLUOROSCOPY TIME:  CT COMPLICATIONS: None PROCEDURE: The procedure risks, benefits, and alternatives were explained to the patient. Questions regarding the procedure were encouraged and answered. The patient understands and consents to the procedure. Scout CT of the pelvis was performed for surgical planning purposes. The posterior pelvis was prepped with Betadinein a sterile fashion, and a sterile drape was applied covering the operative field. A sterile gown and sterile gloves were used for the procedure. Local anesthesia was provided with 1% Lidocaine. We targeted the right posterior iliac bone for biopsy. The skin and subcutaneous tissues were infiltrated with 1% lidocaine without epinephrine. A small stab incision was made with an 11 blade scalpel, and an 11 gauge Murphy needle was advanced with CT guidance to the posterior cortex. Manual forced was used to advance the needle through the posterior cortex and the stylet was removed. A bone marrow aspirate was retrieved and passed to a cytotechnologist in the room. The Murphy needle was then advanced without the stylet for a core biopsy. The core biopsy was retrieved and also passed to a cytotechnologist. Manual pressure was used for hemostasis and a sterile dressing was placed. No complications were encountered no significant  blood loss was encountered. Patient tolerated the procedure well and remained hemodynamically stable throughout. IMPRESSION: Status post CT-guided bone marrow biopsy, with  tissue specimen sent to pathology for complete histopathologic analysis Signed, Dulcy Fanny. Earleen Newport, DO [facility] Radiology Electronically Signed   By: Corrie Mckusick D.O.   On: 12/03/2016 12:18     LOS: 11 days   Signature  Lala Lund M.D on 12/08/2016 at 10:20 AM  Between 7am to 7pm - Pager - [phone] ( page via [email or web address], text pages only, please mention full 10 digit call back number).  After 7pm go to [email or web address] - password [facility]

## 2016-12-09 ENCOUNTER — Telehealth: Payer: Self-pay | Admitting: Pharmacy Technician

## 2016-12-09 ENCOUNTER — Ambulatory Visit: Payer: Medicare Other | Admitting: Nurse Practitioner

## 2016-12-09 ENCOUNTER — Other Ambulatory Visit: Payer: Self-pay | Admitting: *Deleted

## 2016-12-09 ENCOUNTER — Telehealth: Payer: Self-pay | Admitting: *Deleted

## 2016-12-09 DIAGNOSIS — E785 Hyperlipidemia, unspecified: Secondary | ICD-10-CM | POA: Diagnosis not present

## 2016-12-09 DIAGNOSIS — M6281 Muscle weakness (generalized): Secondary | ICD-10-CM | POA: Diagnosis not present

## 2016-12-09 DIAGNOSIS — D508 Other iron deficiency anemias: Secondary | ICD-10-CM | POA: Diagnosis not present

## 2016-12-09 DIAGNOSIS — I2510 Atherosclerotic heart disease of native coronary artery without angina pectoris: Secondary | ICD-10-CM | POA: Diagnosis not present

## 2016-12-09 DIAGNOSIS — K219 Gastro-esophageal reflux disease without esophagitis: Secondary | ICD-10-CM | POA: Diagnosis not present

## 2016-12-09 DIAGNOSIS — I209 Angina pectoris, unspecified: Secondary | ICD-10-CM | POA: Diagnosis not present

## 2016-12-09 DIAGNOSIS — I5032 Chronic diastolic (congestive) heart failure: Secondary | ICD-10-CM | POA: Diagnosis not present

## 2016-12-09 DIAGNOSIS — J9621 Acute and chronic respiratory failure with hypoxia: Secondary | ICD-10-CM | POA: Diagnosis not present

## 2016-12-09 DIAGNOSIS — J441 Chronic obstructive pulmonary disease with (acute) exacerbation: Secondary | ICD-10-CM | POA: Diagnosis not present

## 2016-12-09 DIAGNOSIS — D61818 Other pancytopenia: Secondary | ICD-10-CM | POA: Diagnosis not present

## 2016-12-09 DIAGNOSIS — R339 Retention of urine, unspecified: Secondary | ICD-10-CM | POA: Diagnosis not present

## 2016-12-09 DIAGNOSIS — K5900 Constipation, unspecified: Secondary | ICD-10-CM | POA: Diagnosis not present

## 2016-12-09 NOTE — Telephone Encounter (Signed)
Oral Oncology Patient Advocate Encounter  Received notification from [place] that prior authorization for [name] [name] is required.  PA submitted on CoverMyMeds Key [id number] Status is pending  Oral Oncology Clinic will continue to follow.  [name]. [name], [name] Patient [place] [phone] 12/09/2016 8:19 AM

## 2016-12-09 NOTE — Telephone Encounter (Signed)
Called pt's daughter re: Revlimid enrollment. She reports Dr. [name] explained medication/ side effects and warnings to her and the patient. Teach back done. Enrollment forms faxed to pt c/o [facility] for signature. [name] stated she will fax them back on 12/10/16.

## 2016-12-09 NOTE — Telephone Encounter (Signed)
Oral Oncology Patient Advocate Encounter  Received notification from [place] that prior authorization for Revlimid is required.  PA submitted on CoverMyMeds Key [id number] Status is pending  Oral Oncology Clinic will continue to follow.  [name]. [name], [place] Patient [place] [phone] 12/09/2016 10:37 AM

## 2016-12-09 NOTE — Telephone Encounter (Signed)
Oral Oncology Patient Advocate Encounter  Prior Authorization for Kennieth Rad has been approved.    PA# [id number] Effective dates: 12/09/2016 through 01/26/2018  Oral Oncology Clinic will continue to follow.   [name]. [name], [place] Patient [place] [phone] 12/09/2016 8:24 AM

## 2016-12-09 NOTE — Telephone Encounter (Signed)
Oral Oncology Patient Advocate Encounter  Prior Authorization for Revlimid has been approved.    PA# [id number] Effective dates: 12/09/2016 through 01/26/2018  Oral Oncology Clinic will continue to follow.   [name]. [name], [place] Patient [place] [phone] 12/09/2016 10:39 AM

## 2016-12-10 DIAGNOSIS — I209 Angina pectoris, unspecified: Secondary | ICD-10-CM | POA: Diagnosis not present

## 2016-12-10 DIAGNOSIS — J9621 Acute and chronic respiratory failure with hypoxia: Secondary | ICD-10-CM | POA: Diagnosis not present

## 2016-12-10 DIAGNOSIS — I2510 Atherosclerotic heart disease of native coronary artery without angina pectoris: Secondary | ICD-10-CM | POA: Diagnosis not present

## 2016-12-10 DIAGNOSIS — E785 Hyperlipidemia, unspecified: Secondary | ICD-10-CM | POA: Diagnosis not present

## 2016-12-10 DIAGNOSIS — K219 Gastro-esophageal reflux disease without esophagitis: Secondary | ICD-10-CM | POA: Diagnosis not present

## 2016-12-10 DIAGNOSIS — D508 Other iron deficiency anemias: Secondary | ICD-10-CM | POA: Diagnosis not present

## 2016-12-10 DIAGNOSIS — K5900 Constipation, unspecified: Secondary | ICD-10-CM | POA: Diagnosis not present

## 2016-12-10 DIAGNOSIS — R339 Retention of urine, unspecified: Secondary | ICD-10-CM | POA: Diagnosis not present

## 2016-12-10 DIAGNOSIS — D61818 Other pancytopenia: Secondary | ICD-10-CM | POA: Diagnosis not present

## 2016-12-10 DIAGNOSIS — J441 Chronic obstructive pulmonary disease with (acute) exacerbation: Secondary | ICD-10-CM | POA: Diagnosis not present

## 2016-12-10 DIAGNOSIS — M6281 Muscle weakness (generalized): Secondary | ICD-10-CM | POA: Diagnosis not present

## 2016-12-10 DIAGNOSIS — I5032 Chronic diastolic (congestive) heart failure: Secondary | ICD-10-CM | POA: Diagnosis not present

## 2016-12-10 MED ORDER — DEXAMETHASONE 4 MG PO TABS: ORAL_TABLET | ORAL | 3 refills | Status: DC

## 2016-12-10 MED ORDER — LENALIDOMIDE 10 MG PO CAPS: 10.0000 mg | ORAL_CAPSULE | Freq: Every day | ORAL | 0 refills | Status: DC

## 2016-12-10 NOTE — Telephone Encounter (Signed)
Oral Oncology Pharmacist Encounter  Received prescription for Revlimid (lenalidomide) for the treatment of multiple myeloma in conjunction with dexamethasone, planned duration 4-6 cycles then reassess.  Assessment previously performed, dosing is appropriate for age and renal function. Patient on ASA, acyclovir not needed without proteasome inhibition.  Prescription has been e-scribed to [place] (ph: [phone]) for benefits analysis and approval as Revlimid is a limited distribution medication.  Oral Oncology Clinic will continue to follow for insurance authorization, copayment issues, initial counseling and start date.  Johny Drilling, PharmD, BCPS, BCOP 12/10/2016 3:57 PM Oral Oncology Clinic [phone]

## 2016-12-10 NOTE — Telephone Encounter (Signed)
Oral Chemotherapy Pharmacist Encounter   I spoke with patient's daughter, [name], for overview of new oral chemotherapy medication: Revlimid for the treatment of newly diagnosed multiple myeloma in conjunction with dexamethasone, planned duration 4-6 cycles then reasses.   Counseled [name] on administration, dosing, side effects, monitoring, drug-food interactions, safe handling, storage, and disposal.  Patient will take Revlimid 44m capsules, 1 capsule by mouth once daily, without regard to food, with a full glass of water. Revlimid will be given 21 days on, 7 days off, repeat every 28 days. Patient will take dexamethasone 431mtablets, 5 tablets (2026mby mouth once weekly with breakfast. Revlimid start date: to be established at 12/15/16 office visit  Side effects of Revlimid include but not limited to: nausea, constipation, diarrhea, abdominal pain, rash, fatigue, drug fever, and decreased blood counts.    Reviewed with patient importance of keeping a medication schedule and plan for any missed doses.  SteColletta Marylandiced understanding and appreciation.   All questions answered. Medication reconciliation performed and medication/allergy list updated.  Provided phone number to [facility] Revlimid prescription. Dexamethasone prescription has been e-scribed to [employer]. She will pick it up and knows to wait until seen in office on 12/15/16 prior to starting treatment course.  Patient knows to call the office with questions or concerns. Oral Oncology Clinic will continue to follow.  JesJohny DrillingharmD, BCPS, BCOP 12/10/2016    4:41 PM Oral Oncology Clinic [phone]

## 2016-12-11 ENCOUNTER — Telehealth: Payer: Self-pay | Admitting: Cardiology

## 2016-12-11 NOTE — Telephone Encounter (Signed)
Spoke w/ pt daughter b/c pt home monitor has not updated since 10-30-2016. I informed her that a new monitor was ordered but I was unsure if he had every set it up. Pt daughter verbalized that pt is currently in a rehab facility and she is not sure when he will be home. She is going to look into taking the home monitor to the facility.

## 2016-12-15 ENCOUNTER — Encounter: Payer: Self-pay | Admitting: Nurse Practitioner

## 2016-12-15 ENCOUNTER — Ambulatory Visit (HOSPITAL_BASED_OUTPATIENT_CLINIC_OR_DEPARTMENT_OTHER): Payer: Medicare Other

## 2016-12-15 ENCOUNTER — Telehealth: Payer: Self-pay | Admitting: Oncology

## 2016-12-15 ENCOUNTER — Ambulatory Visit (HOSPITAL_COMMUNITY)
Admission: RE | Admit: 2016-12-15 | Discharge: 2016-12-15 | Disposition: A | Discharge status: Home / Self Care | Payer: Medicare Other | Source: Ambulatory Visit | Attending: Oncology | Admitting: Oncology

## 2016-12-15 ENCOUNTER — Ambulatory Visit (HOSPITAL_BASED_OUTPATIENT_CLINIC_OR_DEPARTMENT_OTHER): Payer: Medicare Other | Admitting: Nurse Practitioner

## 2016-12-15 VITALS — BP 121/49 | HR 76 | Temp 98.2°F | Resp 18 | Ht 72.0 in | Wt 158.2 lb

## 2016-12-15 DIAGNOSIS — C9000 Multiple myeloma not having achieved remission: Secondary | ICD-10-CM

## 2016-12-15 DIAGNOSIS — K5900 Constipation, unspecified: Secondary | ICD-10-CM | POA: Diagnosis not present

## 2016-12-15 DIAGNOSIS — I209 Angina pectoris, unspecified: Secondary | ICD-10-CM | POA: Diagnosis not present

## 2016-12-15 DIAGNOSIS — D649 Anemia, unspecified: Secondary | ICD-10-CM | POA: Diagnosis not present

## 2016-12-15 DIAGNOSIS — R339 Retention of urine, unspecified: Secondary | ICD-10-CM | POA: Diagnosis not present

## 2016-12-15 DIAGNOSIS — D508 Other iron deficiency anemias: Secondary | ICD-10-CM | POA: Diagnosis not present

## 2016-12-15 DIAGNOSIS — D61818 Other pancytopenia: Secondary | ICD-10-CM | POA: Diagnosis not present

## 2016-12-15 DIAGNOSIS — I5032 Chronic diastolic (congestive) heart failure: Secondary | ICD-10-CM | POA: Diagnosis not present

## 2016-12-15 DIAGNOSIS — K219 Gastro-esophageal reflux disease without esophagitis: Secondary | ICD-10-CM | POA: Diagnosis not present

## 2016-12-15 DIAGNOSIS — J441 Chronic obstructive pulmonary disease with (acute) exacerbation: Secondary | ICD-10-CM | POA: Diagnosis not present

## 2016-12-15 DIAGNOSIS — J9621 Acute and chronic respiratory failure with hypoxia: Secondary | ICD-10-CM | POA: Diagnosis not present

## 2016-12-15 DIAGNOSIS — E785 Hyperlipidemia, unspecified: Secondary | ICD-10-CM | POA: Diagnosis not present

## 2016-12-15 DIAGNOSIS — I2510 Atherosclerotic heart disease of native coronary artery without angina pectoris: Secondary | ICD-10-CM | POA: Diagnosis not present

## 2016-12-15 DIAGNOSIS — M6281 Muscle weakness (generalized): Secondary | ICD-10-CM | POA: Diagnosis not present

## 2016-12-15 LAB — COMPREHENSIVE METABOLIC PANEL
ALT: 11 U/L (ref 0–55)
AST: 11 U/L (ref 5–34)
Albumin: 2.7 g/dL — ABNORMAL LOW (ref 3.5–5.0)
Alkaline Phosphatase: 49 U/L (ref 40–150)
Anion Gap: 8 mEq/L (ref 3–11)
BUN: 23.4 mg/dL (ref 7.0–26.0)
CALCIUM: 8.2 mg/dL — AB (ref 8.4–10.4)
CHLORIDE: 107 meq/L (ref 98–109)
CO2: 28 meq/L (ref 22–29)
CREATININE: 1.3 mg/dL (ref 0.7–1.3)
EGFR: 48 mL/min/{1.73_m2} — ABNORMAL LOW (ref >60)
GLUCOSE: 92 mg/dL (ref 70–140)
POTASSIUM: 4.5 meq/L (ref 3.5–5.1)
SODIUM: 143 meq/L (ref 136–145)
Total Bilirubin: 0.3 mg/dL (ref 0.20–1.20)
Total Protein: 5.9 g/dL — ABNORMAL LOW (ref 6.4–8.3)

## 2016-12-15 LAB — CBC WITH DIFFERENTIAL/PLATELET
BASO%: 0.7 % (ref 0.0–2.0)
BASOS ABS: 0 10*3/uL (ref 0.0–0.1)
EOS ABS: 0.1 10*3/uL (ref 0.0–0.5)
EOS%: 1.9 % (ref 0.0–7.0)
HCT: 23.9 % — ABNORMAL LOW (ref 38.4–49.9)
HGB: 7.6 g/dL — ABNORMAL LOW (ref 13.0–17.1)
LYMPH%: 23.8 % (ref 14.0–49.0)
MCH: 23.8 pg — AB (ref 27.2–33.4)
MCHC: 31.6 g/dL — ABNORMAL LOW (ref 32.0–36.0)
MCV: 75.2 fL — AB (ref 79.3–98.0)
MONO#: 0.1 10*3/uL (ref 0.1–0.9)
MONO%: 4.6 % (ref 0.0–14.0)
NEUT%: 69 % (ref 39.0–75.0)
NEUTROS ABS: 2.2 10*3/uL (ref 1.5–6.5)
PLATELETS: 109 10*3/uL — AB (ref 140–400)
RBC: 3.18 10*6/uL — ABNORMAL LOW (ref 4.20–5.82)
RDW: 21.4 % — ABNORMAL HIGH (ref 11.0–14.6)
WBC: 3.2 10*3/uL — AB (ref 4.0–10.3)
lymph#: 0.8 10*3/uL — ABNORMAL LOW (ref 0.9–3.3)

## 2016-12-15 LAB — SAMPLE TO BLOOD BANK

## 2016-12-15 NOTE — Telephone Encounter (Signed)
Oral Oncology Patient Advocate Encounter  Received communication from [name] that the patient's initial prescription of Revlimid has shipped for arrival to the patient's home on 12/16/16.  The copayment is $0.    [name] [name]. [name], [place] Patient [place] [phone] 12/15/2016 1:09 PM

## 2016-12-15 NOTE — Telephone Encounter (Signed)
Gave avs and calendar for December  °

## 2016-12-15 NOTE — Progress Notes (Addendum)
  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  He is accompanied by his daughters.  They think the first cycle of Revlimid will arrive tomorrow.  He is fatigued.  He has shortness of breath.  He has constipation relieved with a laxative.  Objective:  Vital signs in last 24 hours:  Blood pressure (!) 121/49, pulse 76, temperature 98.2 F (36.8 C), temperature source Oral, resp. rate 18, height 6' (1.829 m), weight 158 lb 3.2 oz (71.8 kg), SpO2 100 %.    HEENT: No thrush or ulcers. Lymphatics: No palpable cervical or supraclavicular lymph nodes. Resp: Distant breath sounds.  No respiratory distress. Cardio: Distant heart sounds. GI: No hepatosplenomegaly. Vascular: Pitting edema at the lower legs bilaterally.   Lab Results:  Lab Results  Component Value Date   WBC 3.1 (L) 12/04/2016   HGB 8.2 (L) 12/04/2016   HCT 25.1 (L) 12/04/2016   MCV 76.3 (L) 12/04/2016   PLT 62 (L) 12/04/2016   NEUTROABS 2.4 12/04/2016    Imaging:  No results found.  Medications: I have reviewed the patient's current medications.  Assessment/Plan: 1. Pancytopenia-transfuse with packed red blood cells 11/28/2016 2. History of beta thalassemia trait 3. History of vitamin B-12 deficiency-vitamin B-12 replacement initiated 11/11/2016 4. History of a serum monoclonal IgAkappaprotein-elevated serum free kappa light chains, IgA, and M spike10/16/2018  Bone marrow biopsy 12/03/2016-consistent with multiple myeloma  Bone survey 12/06/2016-no destructive or lytic bone lesions identified.  Revlimid 21 days on/7 days off and weekly dexamethasone 12/16/2016 5. COPD with acute and chronic respiratory failure/hypoxemia 6. History of coronary artery disease 7. Ischemic cardiomyopathy 8. ICD in place 9. 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 The plan is to begin Revlimid 21 days on/7 days off and weekly dexamethasone.  We reviewed potential toxicities with him and his family.  We will obtain baseline labs today to include a CBC, chemistry panel, serum protein electrophoresis and light chains.  He will return in 2 weeks for a follow-up CBC.  We will see him in follow-up in 4 weeks.  He will contact the office in the interim with any problems.  Patient seen with Dr. [name].    Ned Card ANP/GNP-BC   12/15/2016  3:42 PM This was insured visit with Ned Card.  We discussed the multiple myeloma diagnosis and treatment options with [name] and his daughters.  I recommend proceeding with Revlimid/Decadron tomorrow.  We reviewed the potential toxicities associated with this regimen.  He agrees to proceed.  Julieanne Manson, MD

## 2016-12-16 ENCOUNTER — Telehealth: Payer: Self-pay | Admitting: *Deleted

## 2016-12-16 LAB — KAPPA/LAMBDA LIGHT CHAINS
IG KAPPA FREE LIGHT CHAIN: 305.5 mg/L — AB (ref 3.3–19.4)
Ig Lambda Free Light Chain: 13.3 mg/L (ref 5.7–26.3)
Kappa/Lambda FluidC Ratio: 22.97 — ABNORMAL HIGH (ref 0.26–1.65)

## 2016-12-16 NOTE — Telephone Encounter (Signed)
Spoke with [name] [name] at [facility]. Decadron and Revlimid instructions reviewed. She voiced understanding. Stated he will begin on 12/17/16.

## 2016-12-16 NOTE — Telephone Encounter (Signed)
Voicemail from [name] [name] at [facility] requesting a call back to clarify an order. Returned call, she was unavailable. Left office # for her to call back.

## 2016-12-17 LAB — PROTEIN ELECTROPHORESIS, SERUM
A/G Ratio: 1 (ref 0.7–1.7)
ALPHA 2: 0.7 g/dL (ref 0.4–1.0)
Albumin: 2.8 g/dL — ABNORMAL LOW (ref 2.9–4.4)
Alpha 1: 0.3 g/dL (ref 0.0–0.4)
BETA: 1.4 g/dL — AB (ref 0.7–1.3)
GAMMA GLOBULIN: 0.3 g/dL — AB (ref 0.4–1.8)
GLOBULIN, TOTAL: 2.7 g/dL (ref 2.2–3.9)
M-SPIKE, %: 0.7 g/dL — AB
Total Protein: 5.5 g/dL — ABNORMAL LOW (ref 6.0–8.5)

## 2016-12-22 DIAGNOSIS — I209 Angina pectoris, unspecified: Secondary | ICD-10-CM | POA: Diagnosis not present

## 2016-12-22 DIAGNOSIS — J441 Chronic obstructive pulmonary disease with (acute) exacerbation: Secondary | ICD-10-CM | POA: Diagnosis not present

## 2016-12-22 DIAGNOSIS — J9621 Acute and chronic respiratory failure with hypoxia: Secondary | ICD-10-CM | POA: Diagnosis not present

## 2016-12-22 DIAGNOSIS — E785 Hyperlipidemia, unspecified: Secondary | ICD-10-CM | POA: Diagnosis not present

## 2016-12-22 DIAGNOSIS — M6281 Muscle weakness (generalized): Secondary | ICD-10-CM | POA: Diagnosis not present

## 2016-12-22 DIAGNOSIS — D508 Other iron deficiency anemias: Secondary | ICD-10-CM | POA: Diagnosis not present

## 2016-12-22 DIAGNOSIS — K219 Gastro-esophageal reflux disease without esophagitis: Secondary | ICD-10-CM | POA: Diagnosis not present

## 2016-12-22 DIAGNOSIS — I5032 Chronic diastolic (congestive) heart failure: Secondary | ICD-10-CM | POA: Diagnosis not present

## 2016-12-22 DIAGNOSIS — D61818 Other pancytopenia: Secondary | ICD-10-CM | POA: Diagnosis not present

## 2016-12-22 DIAGNOSIS — K5900 Constipation, unspecified: Secondary | ICD-10-CM | POA: Diagnosis not present

## 2016-12-22 DIAGNOSIS — R339 Retention of urine, unspecified: Secondary | ICD-10-CM | POA: Diagnosis not present

## 2016-12-22 DIAGNOSIS — I2510 Atherosclerotic heart disease of native coronary artery without angina pectoris: Secondary | ICD-10-CM | POA: Diagnosis not present

## 2016-12-23 DIAGNOSIS — K5900 Constipation, unspecified: Secondary | ICD-10-CM | POA: Diagnosis not present

## 2016-12-23 DIAGNOSIS — I209 Angina pectoris, unspecified: Secondary | ICD-10-CM | POA: Diagnosis not present

## 2016-12-23 DIAGNOSIS — I2510 Atherosclerotic heart disease of native coronary artery without angina pectoris: Secondary | ICD-10-CM | POA: Diagnosis not present

## 2016-12-23 DIAGNOSIS — R339 Retention of urine, unspecified: Secondary | ICD-10-CM | POA: Diagnosis not present

## 2016-12-23 DIAGNOSIS — K219 Gastro-esophageal reflux disease without esophagitis: Secondary | ICD-10-CM | POA: Diagnosis not present

## 2016-12-23 DIAGNOSIS — E785 Hyperlipidemia, unspecified: Secondary | ICD-10-CM | POA: Diagnosis not present

## 2016-12-23 DIAGNOSIS — D508 Other iron deficiency anemias: Secondary | ICD-10-CM | POA: Diagnosis not present

## 2016-12-23 DIAGNOSIS — M6281 Muscle weakness (generalized): Secondary | ICD-10-CM | POA: Diagnosis not present

## 2016-12-23 DIAGNOSIS — J441 Chronic obstructive pulmonary disease with (acute) exacerbation: Secondary | ICD-10-CM | POA: Diagnosis not present

## 2016-12-23 DIAGNOSIS — D61818 Other pancytopenia: Secondary | ICD-10-CM | POA: Diagnosis not present

## 2016-12-23 DIAGNOSIS — J9621 Acute and chronic respiratory failure with hypoxia: Secondary | ICD-10-CM | POA: Diagnosis not present

## 2016-12-23 DIAGNOSIS — I5032 Chronic diastolic (congestive) heart failure: Secondary | ICD-10-CM | POA: Diagnosis not present

## 2016-12-24 ENCOUNTER — Telehealth: Payer: Self-pay | Admitting: Cardiology

## 2016-12-24 ENCOUNTER — Encounter: Payer: Medicare Other | Admitting: *Deleted

## 2016-12-24 DIAGNOSIS — J441 Chronic obstructive pulmonary disease with (acute) exacerbation: Secondary | ICD-10-CM | POA: Diagnosis not present

## 2016-12-24 DIAGNOSIS — I5032 Chronic diastolic (congestive) heart failure: Secondary | ICD-10-CM | POA: Diagnosis not present

## 2016-12-24 DIAGNOSIS — R2681 Unsteadiness on feet: Secondary | ICD-10-CM | POA: Diagnosis not present

## 2016-12-24 NOTE — Telephone Encounter (Signed)
Confirmed remote transmission w/ pt daughter.   

## 2016-12-25 ENCOUNTER — Encounter: Payer: Self-pay | Admitting: Cardiology

## 2016-12-25 ENCOUNTER — Ambulatory Visit (HOSPITAL_BASED_OUTPATIENT_CLINIC_OR_DEPARTMENT_OTHER): Payer: Medicare Other

## 2016-12-25 ENCOUNTER — Other Ambulatory Visit: Payer: Self-pay | Admitting: *Deleted

## 2016-12-25 ENCOUNTER — Ambulatory Visit (INDEPENDENT_AMBULATORY_CARE_PROVIDER_SITE_OTHER): Payer: Medicare Other | Admitting: Adult Health

## 2016-12-25 ENCOUNTER — Telehealth: Payer: Self-pay | Admitting: *Deleted

## 2016-12-25 ENCOUNTER — Encounter: Payer: Self-pay | Admitting: Adult Health

## 2016-12-25 DIAGNOSIS — J441 Chronic obstructive pulmonary disease with (acute) exacerbation: Secondary | ICD-10-CM | POA: Diagnosis not present

## 2016-12-25 DIAGNOSIS — I255 Ischemic cardiomyopathy: Secondary | ICD-10-CM

## 2016-12-25 DIAGNOSIS — D649 Anemia, unspecified: Secondary | ICD-10-CM | POA: Diagnosis not present

## 2016-12-25 DIAGNOSIS — C9000 Multiple myeloma not having achieved remission: Secondary | ICD-10-CM

## 2016-12-25 DIAGNOSIS — J9611 Chronic respiratory failure with hypoxia: Secondary | ICD-10-CM | POA: Diagnosis not present

## 2016-12-25 LAB — CBC WITH DIFFERENTIAL/PLATELET
BASO%: 0.2 % (ref 0.0–2.0)
BASOS ABS: 0 10*3/uL (ref 0.0–0.1)
EOS ABS: 0.1 10*3/uL (ref 0.0–0.5)
EOS%: 2 % (ref 0.0–7.0)
HEMATOCRIT: 24.7 % — AB (ref 38.4–49.9)
HEMOGLOBIN: 7.6 g/dL — AB (ref 13.0–17.1)
LYMPH%: 22.4 % (ref 14.0–49.0)
MCH: 23.4 pg — ABNORMAL LOW (ref 27.2–33.4)
MCHC: 30.8 g/dL — ABNORMAL LOW (ref 32.0–36.0)
MCV: 76 fL — ABNORMAL LOW (ref 79.3–98.0)
MONO#: 0.2 10*3/uL (ref 0.1–0.9)
MONO%: 4.6 % (ref 0.0–14.0)
NEUT%: 70.8 % (ref 39.0–75.0)
NEUTROS ABS: 3.5 10*3/uL (ref 1.5–6.5)
PLATELETS: 91 10*3/uL — AB (ref 140–400)
RBC: 3.25 10*6/uL — ABNORMAL LOW (ref 4.20–5.82)
RDW: 20.5 % — AB (ref 11.0–14.6)
WBC: 5 10*3/uL (ref 4.0–10.3)
lymph#: 1.1 10*3/uL (ref 0.9–3.3)
nRBC: 0 % (ref 0–0)

## 2016-12-25 LAB — TECHNOLOGIST REVIEW

## 2016-12-25 LAB — PREPARE RBC (CROSSMATCH)

## 2016-12-25 NOTE — Telephone Encounter (Signed)
CBC result reviewed with Dr. [name]. HGB stable. Symptoms are likely related to heart or lungs. We can transfuse one unit to see if this helps .  It is too soon for the Revlimid to have helped the myeloma.  Called daughter with the above information. Informed her schedulers will call with appt time for 12/1. Instructed her to have him keep blood band intact until Saturday's appt.

## 2016-12-25 NOTE — Progress Notes (Signed)
$'@Patient'c$  ID: [name] Azure, MD, male    DOB: 24-Dec-1925, 83 y.o.   MRN: [id number]  Chief Complaint  Patient presents with  . Follow-up    Asthma     Referring provider: [name], MD  HPI: 83 year old male, previous dentist, former smoker followed for Asthma/COPD  Hx Diastolic CHF  Chronic urinary retention -self caths    TEST  PFT's 04/16/11 FEV1 1.57 (60%) ratio 71 and 12% better p B2, dlco 41 corrects to 58  12/25/2016 Post hospital follow up .  Pt presents for a follow up . He was admitted early last month for COPD exacerbation . He was tx w/ steroids and nebs. He has been having worsening pancytopenia ,s/p bone marrow biopsy c/w multiple myeloma  followed by Hematology . He is feeling better. Has been in rehab , just came home this week. He is doing PT 3 x /wk. On O2 4l/m . O2 sats on arrival 100%.  o2 checked on 2l/m ,  On 2l/m pulse POC 95%. Walking . He would like a POC so he can do PT better.  No increased edema or cough  Remains on Brovana and pulmicort .neb Twice daily  .     No Known Allergies  Immunization History  Administered Date(s) Administered  . Influenza Split 10/28/2010, 10/27/2012  . Influenza Whole 12/08/2011  . Influenza, High Dose Seasonal PF 10/16/2016  . Pneumococcal Conjugate-13 05/26/2013  . Pneumococcal Polysaccharide-23 10/31/1998    Past Medical History:  Diagnosis Date  . Anemia, unspecified   . CAD (coronary artery disease)   . Cataracts, bilateral   . CHF (congestive heart failure) ([place])   . Chronic airway obstruction, not elsewhere classified    on o2 at night  . Diabetes mellitus   . Diverticulosis of colon (without mention of hemorrhage)   . Dyslipidemia   . Emphysema   . Hemorrhoids   . ICD (implantable cardiac defibrillator) in place    st jude  . Ischemic cardiomyopathy   . Macular degeneration   . MI, old 2010 or 2011  . Prostatic hypertrophy    hx of  . Retention of urine, unspecified   . Unspecified  disorder resulting from impaired renal function     Tobacco History: Social History   Tobacco Use  Smoking Status Former Smoker  . Types: Cigarettes  . Last attempt to quit: 04/27/2009  . Years since quitting: 7.6  Smokeless Tobacco Never Used   Counseling given: Not Answered   Outpatient Encounter Medications as of 12/25/2016  Medication Sig  . albuterol (PROVENTIL) (2.5 MG/3ML) 0.083% nebulizer solution Take 3 mLs (2.5 mg total) every 2 (two) hours as needed by nebulization for wheezing or shortness of breath.  . ALPRAZolam (XANAX) 0.25 MG tablet Take 1 tablet (0.25 mg total) at bedtime as needed by mouth for sleep.  Marland Kitchen arformoterol (BROVANA) 15 MCG/2ML NEBU Take 15 mcg by nebulization 2 (two) times daily.  Marland Kitchen aspirin EC 81 MG tablet Take 1 tablet (81 mg total) by mouth at bedtime.  . bisacodyl (DULCOLAX) 5 MG EC tablet Take 5 mg by mouth daily as needed for moderate constipation.  . budesonide (PULMICORT) 0.5 MG/2ML nebulizer solution Take 2 mLs (0.5 mg total) by nebulization 2 (two) times daily.  Marland Kitchen dexamethasone (DECADRON) 4 MG tablet Take 5 tablets ('20mg'$ ) by mouth once weekly with breakfast.  . ferrous sulfate 325 (65 FE) MG tablet Take 1 tablet (325 mg total) by mouth 2 (two) times daily with a meal.  .  fluticasone (FLONASE) 50 MCG/ACT nasal spray Place 1 spray into both nostrils daily.  Marland Kitchen guaiFENesin (MUCINEX) 600 MG 12 hr tablet Take 600 mg by mouth 2 (two) times daily as needed for cough or to loosen phlegm.   Marland Kitchen lenalidomide (REVLIMID) 10 MG capsule Take 1 capsule (10 mg total) daily by mouth. Take with water for 21 days on, 7 days off, repeat every 28 days.  . metoprolol tartrate (LOPRESSOR) 25 MG tablet Take 12.5 mg by mouth 2 (two) times daily. Reported on 06/06/2015  . nitroGLYCERIN (NITROSTAT) 0.4 MG SL tablet Place 1 tablet (0.4 mg total) under the tongue every 5 (five) minutes as needed for chest pain. UP TO 3 DOSES THEN CALL EMS  . OXYGEN Inhale 2 L into the lungs at  bedtime.   . pantoprazole (PROTONIX) 40 MG tablet Take 1 tablet (40 mg total) by mouth daily.  . rosuvastatin (CRESTOR) 5 MG tablet Take 1 tablet (5 mg total) by mouth daily at 6 PM. (Patient taking differently: Take 5 mg by mouth daily. )  . sodium chloride (OCEAN) 0.65 % SOLN nasal spray Place 2 sprays into both nostrils 2 (two) times daily.  Marland Kitchen trimethoprim (TRIMPEX) 100 MG tablet Take 1 tablet (100 mg total) daily by mouth.  . vitamin B-12 1000 MCG tablet Take 1 tablet (1,000 mcg total) daily by mouth.  . vitamin C (VITAMIN C) 250 MG tablet Take 1 tablet (250 mg total) by mouth 2 (two) times daily.   No facility-administered encounter medications on file as of 12/25/2016.      Review of Systems  Constitutional:   No  weight loss, night sweats,  Fevers, chills,  +fatigue, or  lassitude.  HEENT:   No headaches,  Difficulty swallowing,  Tooth/dental problems, or  Sore throat,                No sneezing, itching, ear ache, nasal congestion, post nasal drip,   CV:  No chest pain,  Orthopnea, PND, swelling in lower extremities, anasarca, dizziness, palpitations, syncope.   GI  No heartburn, indigestion, abdominal pain, nausea, vomiting, diarrhea, change in bowel habits, loss of appetite, bloody stools.   Resp:    No chest wall deformity  Skin: no rash or lesions.  GU: no dysuria, change in color of urine, no urgency or frequency.  No flank pain, no hematuria   MS:  No joint pain or swelling.  No decreased range of motion.  No back pain.    Physical Exam  BP (!) 104/56 (BP Location: Right Arm, Cuff Size: Normal)   Pulse 77   Ht 6' (1.829 m)   Wt 158 lb (71.7 kg)   SpO2 100%   BMI 21.43 kg/m   GEN: A/Ox3; pleasant , NAD , eldelry , frail in wc    HEENT:  [place]/AT,  EACs-clear, TMs-wnl, NOSE-clear, THROAT-clear, no lesions, no postnasal drip or exudate noted.   NECK:  Supple w/ fair ROM; no JVD; normal carotid impulses w/o bruits; no thyromegaly or nodules palpated; no  lymphadenopathy.    RESP  Decreased BS in bases   no accessory muscle use, no dullness to percussion  CARD:  RRR, no m/r/g, no peripheral edema, pulses intact, no cyanosis or clubbing.  GI:   Soft & nt; nml bowel sounds; no organomegaly or masses detected.   Musco: Warm bil, no deformities or joint swelling noted.   Neuro: alert, no focal deficits noted.    Skin: Warm, no lesions or rashes  Lab Results:    ProBN   Assessment & Plan:   COPD with exacerbation [facility]) Resolved   Plan  Patient Instructions  Continue on Brovana and Pulmicort neb Twice daily .  Albuterol neb every 4hr as needed -this is your rescue .  Albuterol Inhaler 2 puffs every 4hrs as needed -rescue inhaler .  Decrease oxygen Oxygen 2l/m.  Will send order to DME to POC device .  Follow up with Dr. Elsworth Soho in 3 months and As needed   Please contact office for sooner follow up if symptoms do not improve or worsen or seek emergency care       Chronic respiratory failure with hypoxia ([place]) Can decrease O2 2l/m .  POC order to DME       Rexene Edison, NP 12/25/2016

## 2016-12-25 NOTE — Telephone Encounter (Signed)
Message from pt's daughter reporting he woke up confused today. He began Revlimid last week and noticed some itching. He "didn't feel right" on 11/28. 1130: Left message for daughter to call back, will work pt in to be seen in [facility] today. 1520: Called daughter, she reports she has been in touch with his caregivers and the confusion has resolved. Daughter wonders if he needs transfusion. Reviewed with Dr. [name]. Order received for CBC/ Type+Hold. Daughter will call to let us know when she can bring him for lab. Daughter returned call, she will bring him in this afternoon for lab.

## 2016-12-25 NOTE — Assessment & Plan Note (Signed)
Resolved   Plan  Patient Instructions  Continue on Brovana and Pulmicort neb Twice daily .  Albuterol neb every 4hr as needed -this is your rescue .  Albuterol Inhaler 2 puffs every 4hrs as needed -rescue inhaler .  Decrease oxygen Oxygen 2l/m.  Will send order to DME to POC device .  Follow up with Dr. Elsworth Soho in 3 months and As needed   Please contact office for sooner follow up if symptoms do not improve or worsen or seek emergency care

## 2016-12-25 NOTE — Patient Instructions (Addendum)
Continue on Brovana and Pulmicort neb Twice daily .  Albuterol neb every 4hr as needed -this is your rescue .  Albuterol Inhaler 2 puffs every 4hrs as needed -rescue inhaler .  Decrease oxygen Oxygen 2l/m.  Will send order to DME to POC device .  Follow up with Dr. Elsworth Soho in 3 months and As needed   Please contact office for sooner follow up if symptoms do not improve or worsen or seek emergency care

## 2016-12-25 NOTE — Assessment & Plan Note (Signed)
Can decrease O2 2l/m .  POC order to DME

## 2016-12-26 ENCOUNTER — Telehealth: Payer: Self-pay | Admitting: Oncology

## 2016-12-26 DIAGNOSIS — C9000 Multiple myeloma not having achieved remission: Secondary | ICD-10-CM | POA: Diagnosis not present

## 2016-12-26 DIAGNOSIS — D649 Anemia, unspecified: Secondary | ICD-10-CM | POA: Diagnosis not present

## 2016-12-26 DIAGNOSIS — R2681 Unsteadiness on feet: Secondary | ICD-10-CM | POA: Diagnosis not present

## 2016-12-26 DIAGNOSIS — I5032 Chronic diastolic (congestive) heart failure: Secondary | ICD-10-CM | POA: Diagnosis not present

## 2016-12-26 DIAGNOSIS — J441 Chronic obstructive pulmonary disease with (acute) exacerbation: Secondary | ICD-10-CM | POA: Diagnosis not present

## 2016-12-26 MED ORDER — ALBUTEROL SULFATE HFA 108 (90 BASE) MCG/ACT IN AERS: 2.0000 | INHALATION_SPRAY | RESPIRATORY_TRACT | 3 refills | Status: AC | PRN

## 2016-12-26 NOTE — Telephone Encounter (Signed)
Let patient know about appts scheduled per 11/29 sch msg.

## 2016-12-26 NOTE — Addendum Note (Signed)
Addended by: Parke Poisson E on: 12/26/2016 11:03 AM   Modules accepted: Orders

## 2016-12-26 NOTE — Progress Notes (Signed)
Reviewed & agree with plan  

## 2016-12-27 ENCOUNTER — Other Ambulatory Visit: Payer: Self-pay | Admitting: *Deleted

## 2016-12-27 ENCOUNTER — Ambulatory Visit: Payer: Medicare Other

## 2016-12-27 DIAGNOSIS — C9000 Multiple myeloma not having achieved remission: Secondary | ICD-10-CM

## 2016-12-27 DIAGNOSIS — D649 Anemia, unspecified: Secondary | ICD-10-CM

## 2016-12-27 LAB — TYPE AND SCREEN
ABO/RH(D): A POS
ANTIBODY SCREEN: NEGATIVE
Unit division: 0

## 2016-12-27 LAB — BPAM RBC
BLOOD PRODUCT EXPIRATION DATE: 201812192359
UNIT TYPE AND RH: 6200

## 2016-12-27 LAB — PREPARE RBC (CROSSMATCH)

## 2016-12-27 MED ORDER — SODIUM CHLORIDE 0.9 % IV SOLN: 250.0000 mL | Freq: Once | INTRAVENOUS | Status: AC

## 2016-12-27 MED ORDER — SODIUM CHLORIDE 0.9 % IV SOLN
250.0000 mL | Freq: Once | INTRAVENOUS | Status: AC
  Administered 2016-12-27: 250 mL via INTRAVENOUS

## 2016-12-27 NOTE — Patient Instructions (Signed)

## 2016-12-27 NOTE — Progress Notes (Unsigned)
Pt arrived without blood band bracelet. New order placed for Type and screen, blood bank notified. Specimen taken to main lab for results.

## 2016-12-29 ENCOUNTER — Telehealth: Payer: Self-pay | Admitting: Adult Health

## 2016-12-29 ENCOUNTER — Ambulatory Visit (HOSPITAL_COMMUNITY)
Admission: RE | Admit: 2016-12-29 | Discharge: 2016-12-29 | Disposition: A | Discharge status: Home / Self Care | Payer: Medicare Other | Source: Ambulatory Visit | Attending: Oncology | Admitting: Oncology

## 2016-12-29 ENCOUNTER — Telehealth: Payer: Self-pay | Admitting: *Deleted

## 2016-12-29 DIAGNOSIS — J441 Chronic obstructive pulmonary disease with (acute) exacerbation: Secondary | ICD-10-CM | POA: Diagnosis not present

## 2016-12-29 DIAGNOSIS — I5032 Chronic diastolic (congestive) heart failure: Secondary | ICD-10-CM | POA: Diagnosis not present

## 2016-12-29 DIAGNOSIS — R2681 Unsteadiness on feet: Secondary | ICD-10-CM | POA: Diagnosis not present

## 2016-12-29 DIAGNOSIS — D61818 Other pancytopenia: Secondary | ICD-10-CM

## 2016-12-29 LAB — TYPE AND SCREEN
ABO/RH(D): A POS
ANTIBODY SCREEN: NEGATIVE
Unit division: 0

## 2016-12-29 LAB — BPAM RBC
BLOOD PRODUCT EXPIRATION DATE: 201812192359
ISSUE DATE / TIME: 201812011101
Unit Type and Rh: 6200

## 2016-12-29 NOTE — Telephone Encounter (Signed)
Message from pt's daughter reporting he is irritable and anxious since starting Rev/Dex. Returned call, [name] stated she isn't sure the treatment is worth it. Per daughter, pt reports feeling depressed and anxious.  He takes Decadron 20mg  Q Wed. Daughter asks, What will progression look like without chemo? Can the Decadron be eliminated? Will these mood swings even out? She thinks he needs to be evaluated in the office, she can only come in the late afternoon.

## 2016-12-29 NOTE — Telephone Encounter (Signed)
Spoke with [name] [name] at [facility] and he does have the order and will be sending this out today. Spoke with [name] and advised of info from [place].  She verbalized understanding and wanted to know a timeframe of when this would be delivered.  I told her that I didn't know a specific time.  She stated that she will call [facility] to get this info.  Nothing further needed.

## 2016-12-29 NOTE — Telephone Encounter (Signed)
Reviewed call with Dr. [name]: HOLD Decadron this week to see if that helps. Continue Revlimid. Returned call to [place] with above instructions. She voiced understanding. Pt will come in for lab only on 12/5. Informed daughter we will call with result.

## 2016-12-29 NOTE — Telephone Encounter (Signed)
Message from pt's daughter, he has a dental appt on 12/4 so won't be able to come in at 1515. She would like to discuss backing down on the steroids. Pt is scheduled for lab only on 12/5.

## 2016-12-29 NOTE — Telephone Encounter (Signed)
Discussed call with Dr. [name]. Will work pt in for office visit with APP 12/4. Called daughter with appt. She voiced appreciation for call.

## 2016-12-29 NOTE — Progress Notes (Signed)
No ICM remote transmission received for 12/24/2017 and next ICM transmission scheduled for 01/15/2017.

## 2016-12-30 ENCOUNTER — Ambulatory Visit: Payer: Medicare Other | Admitting: Nurse Practitioner

## 2016-12-31 ENCOUNTER — Other Ambulatory Visit (HOSPITAL_BASED_OUTPATIENT_CLINIC_OR_DEPARTMENT_OTHER): Payer: Medicare Other

## 2016-12-31 DIAGNOSIS — C9000 Multiple myeloma not having achieved remission: Secondary | ICD-10-CM

## 2016-12-31 DIAGNOSIS — I5032 Chronic diastolic (congestive) heart failure: Secondary | ICD-10-CM | POA: Diagnosis not present

## 2016-12-31 DIAGNOSIS — R2681 Unsteadiness on feet: Secondary | ICD-10-CM | POA: Diagnosis not present

## 2016-12-31 DIAGNOSIS — J441 Chronic obstructive pulmonary disease with (acute) exacerbation: Secondary | ICD-10-CM | POA: Diagnosis not present

## 2016-12-31 LAB — CBC WITH DIFFERENTIAL/PLATELET
BASO%: 0.1 % (ref 0.0–2.0)
Basophils Absolute: 0 10*3/uL (ref 0.0–0.1)
EOS%: 1.5 % (ref 0.0–7.0)
Eosinophils Absolute: 0.1 10*3/uL (ref 0.0–0.5)
HCT: 26.6 % — ABNORMAL LOW (ref 38.4–49.9)
HGB: 8.5 g/dL — ABNORMAL LOW (ref 13.0–17.1)
LYMPH%: 12.3 % — AB (ref 14.0–49.0)
MCH: 24.3 pg — ABNORMAL LOW (ref 27.2–33.4)
MCHC: 32 g/dL (ref 32.0–36.0)
MCV: 76 fL — ABNORMAL LOW (ref 79.3–98.0)
MONO#: 0.5 10*3/uL (ref 0.1–0.9)
MONO%: 6.7 % (ref 0.0–14.0)
NEUT%: 79.4 % — ABNORMAL HIGH (ref 39.0–75.0)
NEUTROS ABS: 5.4 10*3/uL (ref 1.5–6.5)
NRBC: 0 % (ref 0–0)
Platelets: 76 10*3/uL — ABNORMAL LOW (ref 140–400)
RBC: 3.5 10*6/uL — AB (ref 4.20–5.82)
RDW: 20.1 % — AB (ref 11.0–14.6)
WBC: 6.7 10*3/uL (ref 4.0–10.3)
lymph#: 0.8 10*3/uL — ABNORMAL LOW (ref 0.9–3.3)

## 2017-01-02 DIAGNOSIS — J441 Chronic obstructive pulmonary disease with (acute) exacerbation: Secondary | ICD-10-CM | POA: Diagnosis not present

## 2017-01-02 DIAGNOSIS — R2681 Unsteadiness on feet: Secondary | ICD-10-CM | POA: Diagnosis not present

## 2017-01-02 DIAGNOSIS — I5032 Chronic diastolic (congestive) heart failure: Secondary | ICD-10-CM | POA: Diagnosis not present

## 2017-01-06 ENCOUNTER — Encounter (HOSPITAL_COMMUNITY): Payer: Self-pay

## 2017-01-07 LAB — CHROMOSOME ANALYSIS, BONE MARROW

## 2017-01-07 LAB — TISSUE HYBRIDIZATION (BONE MARROW)-NCBH

## 2017-01-08 DIAGNOSIS — M5090 Cervical disc disorder, unspecified, unspecified cervical region: Secondary | ICD-10-CM | POA: Diagnosis not present

## 2017-01-08 DIAGNOSIS — Z1389 Encounter for screening for other disorder: Secondary | ICD-10-CM | POA: Diagnosis not present

## 2017-01-08 DIAGNOSIS — I5030 Unspecified diastolic (congestive) heart failure: Secondary | ICD-10-CM | POA: Diagnosis not present

## 2017-01-08 DIAGNOSIS — N183 Chronic kidney disease, stage 3 (moderate): Secondary | ICD-10-CM | POA: Diagnosis not present

## 2017-01-08 DIAGNOSIS — J9611 Chronic respiratory failure with hypoxia: Secondary | ICD-10-CM | POA: Diagnosis not present

## 2017-01-08 DIAGNOSIS — C9000 Multiple myeloma not having achieved remission: Secondary | ICD-10-CM | POA: Diagnosis not present

## 2017-01-08 DIAGNOSIS — Z23 Encounter for immunization: Secondary | ICD-10-CM | POA: Diagnosis not present

## 2017-01-08 DIAGNOSIS — F419 Anxiety disorder, unspecified: Secondary | ICD-10-CM | POA: Diagnosis not present

## 2017-01-08 DIAGNOSIS — J449 Chronic obstructive pulmonary disease, unspecified: Secondary | ICD-10-CM | POA: Diagnosis not present

## 2017-01-08 DIAGNOSIS — D696 Thrombocytopenia, unspecified: Secondary | ICD-10-CM | POA: Diagnosis not present

## 2017-01-08 DIAGNOSIS — D61818 Other pancytopenia: Secondary | ICD-10-CM | POA: Diagnosis not present

## 2017-01-12 ENCOUNTER — Other Ambulatory Visit: Payer: Self-pay | Admitting: *Deleted

## 2017-01-12 ENCOUNTER — Ambulatory Visit (HOSPITAL_BASED_OUTPATIENT_CLINIC_OR_DEPARTMENT_OTHER): Payer: Medicare Other | Admitting: Oncology

## 2017-01-12 ENCOUNTER — Telehealth: Payer: Self-pay | Admitting: Cardiology

## 2017-01-12 ENCOUNTER — Other Ambulatory Visit (HOSPITAL_BASED_OUTPATIENT_CLINIC_OR_DEPARTMENT_OTHER): Payer: Medicare Other

## 2017-01-12 VITALS — BP 134/91 | HR 66 | Temp 97.8°F | Resp 18 | Ht 72.0 in | Wt 153.8 lb

## 2017-01-12 DIAGNOSIS — I255 Ischemic cardiomyopathy: Secondary | ICD-10-CM

## 2017-01-12 DIAGNOSIS — C9000 Multiple myeloma not having achieved remission: Secondary | ICD-10-CM

## 2017-01-12 DIAGNOSIS — D61818 Other pancytopenia: Secondary | ICD-10-CM

## 2017-01-12 LAB — COMPREHENSIVE METABOLIC PANEL
ALT: 12 U/L (ref 0–55)
ANION GAP: 9 meq/L (ref 3–11)
AST: 11 U/L (ref 5–34)
Albumin: 2.9 g/dL — ABNORMAL LOW (ref 3.5–5.0)
Alkaline Phosphatase: 49 U/L (ref 40–150)
BUN: 23.7 mg/dL (ref 7.0–26.0)
CALCIUM: 8.7 mg/dL (ref 8.4–10.4)
CO2: 27 mEq/L (ref 22–29)
CREATININE: 1 mg/dL (ref 0.7–1.3)
Chloride: 104 mEq/L (ref 98–109)
EGFR: >60 mL/min/{1.73_m2} (ref >60)
Glucose: 84 mg/dl (ref 70–140)
Potassium: 4.4 mEq/L (ref 3.5–5.1)
Sodium: 140 mEq/L (ref 136–145)
TOTAL PROTEIN: 6.1 g/dL — AB (ref 6.4–8.3)
Total Bilirubin: 0.47 mg/dL (ref 0.20–1.20)

## 2017-01-12 LAB — CBC WITH DIFFERENTIAL/PLATELET
BASO%: 1.6 % (ref 0.0–2.0)
Basophils Absolute: 0.1 10*3/uL (ref 0.0–0.1)
EOS%: 2.1 % (ref 0.0–7.0)
Eosinophils Absolute: 0.1 10*3/uL (ref 0.0–0.5)
HCT: 23.8 % — ABNORMAL LOW (ref 38.4–49.9)
HEMOGLOBIN: 7.4 g/dL — AB (ref 13.0–17.1)
LYMPH%: 15.6 % (ref 14.0–49.0)
MCH: 23.2 pg — AB (ref 27.2–33.4)
MCHC: 31.3 g/dL — ABNORMAL LOW (ref 32.0–36.0)
MCV: 74.3 fL — ABNORMAL LOW (ref 79.3–98.0)
MONO#: 0.3 10*3/uL (ref 0.1–0.9)
MONO%: 6.9 % (ref 0.0–14.0)
NEUT%: 73.8 % (ref 39.0–75.0)
NEUTROS ABS: 2.9 10*3/uL (ref 1.5–6.5)
Platelets: 89 10*3/uL — ABNORMAL LOW (ref 140–400)
RBC: 3.2 10*6/uL — AB (ref 4.20–5.82)
RDW: 21 % — AB (ref 11.0–14.6)
WBC: 3.9 10*3/uL — AB (ref 4.0–10.3)
lymph#: 0.6 10*3/uL — ABNORMAL LOW (ref 0.9–3.3)

## 2017-01-12 MED ORDER — LENALIDOMIDE 10 MG PO CAPS: 10.0000 mg | ORAL_CAPSULE | Freq: Every day | ORAL | 0 refills | Status: DC

## 2017-01-12 NOTE — Telephone Encounter (Signed)
Spoke w/ pt daughter and she informed me that pt is unable to get the monitor to work. Pt daughter agreed to an appt on 01-28-17 at 4:00 PM to bring the home monitor in to get the monitor working. Pt home monitor has been disconnected since 12-26-2016.

## 2017-01-12 NOTE — Progress Notes (Signed)
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  He has completed a cycle of Revlimid.  Decadron was discontinued 12/29/2016 secondary to anxiety/depression he was transfused with 1 unit of packed red blood cells on 12/27/2016.  He reports feeling better after the red cell transfusion.  No fever.  He is living at home.  No new complaint.   Objective:  Vital signs in last 24 hours:  Blood pressure (!) 134/91, pulse 66, temperature 97.8 F (36.6 C), temperature source Oral, resp. rate 18, height 6' (1.829 m), weight 153 lb 12.8 oz (69.8 kg), SpO2 100 %.    HEENT: No thrush Resp: Distant breath sounds, end inspiratory rhonchi at the left posterior base, no respiratory distress Cardio: Regular rate and rhythm, distant heart sounds GI: No hepatosplenomegaly, nontender Vascular: Trace pretibial edema bilaterally Neuro: Alert, follows commands, appears oriented  Lab Results:  Lab Results  Component Value Date   WBC 3.9 (L) 01/12/2017   HGB 7.4 (L) 01/12/2017   HCT 23.8 (L) 01/12/2017   MCV 74.3 (L) 01/12/2017   PLT 89 (L) 01/12/2017   NEUTROABS 2.9 01/12/2017    CMP     Component Value Date/Time   NA 140 01/12/2017 1538   K 4.4 01/12/2017 1538   CL 102 12/03/2016 0338   CO2 27 01/12/2017 1538   GLUCOSE 84 01/12/2017 1538   BUN 23.7 01/12/2017 1538   CREATININE 1.0 01/12/2017 1538   CALCIUM 8.7 01/12/2017 1538   PROT 6.1 (L) 01/12/2017 1538   ALBUMIN 2.9 (L) 01/12/2017 1538   AST 11 01/12/2017 1538   ALT 12 01/12/2017 1538   ALKPHOS 49 01/12/2017 1538   BILITOT 0.47 01/12/2017 1538   GFRNONAA >60 12/03/2016 0338   GFRAA >60 12/03/2016 0338     Medications: I have reviewed the patient's current medications.   Assessment/Plan: 1. Pancytopenia-transfuse with packed red blood cells 11/28/2016, 12/27/2016, 01/13/2017 2. History of beta thalassemia trait 3. History of vitamin B-12  deficiency-vitamin B-12 replacement initiated 11/11/2016 4. History of a serum monoclonal IgAkappaprotein-elevated serum free kappa light chains, IgA, and M spike10/16/2018  Bone marrow biopsy 12/03/2016-consistent with multiple myeloma  Bone survey 12/06/2016-no destructive or lytic bone lesions identified.  Revlimid 21 days on/7 days off and weekly dexamethasone 12/16/2016 (Decadron discontinued 12/29/2016 secondary to altered mental status)  Cycle 2 single agent Revlimid 01/14/2017 5. COPD with acute and chronic respiratory failure/hypoxemia 6. History of coronary artery disease 7. Ischemic cardiomyopathy 8. ICD in place 9. 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  He has completed 1 cycle of Revlimid.  Decadron was discontinued after a few weeks secondary to altered mental status.  He does not wish to resume treatment with Decadron.  The plan is to begin a second cycle of single agent Revlimid on 01/14/2017.  He has persistent transfusion-dependent anemia.  He will be transfused with 2 units of packed red blood cells on 01/13/2017.  We discussed adding Velcade to the Revlimid regimen.  We will follow-up on the IgA level and serum light chains from today.  We will consider adding Velcade or melphalan to the systemic therapy regimen if there is no hematologic improvement next month.  25 minutes were spent with the patient today.  The majority of the time was used for counseling and coordination of care.  [name], MD  01/12/2017  4:45 PM

## 2017-01-13 ENCOUNTER — Telehealth: Payer: Self-pay | Admitting: *Deleted

## 2017-01-13 ENCOUNTER — Ambulatory Visit: Payer: Medicare Other

## 2017-01-13 ENCOUNTER — Ambulatory Visit (HOSPITAL_BASED_OUTPATIENT_CLINIC_OR_DEPARTMENT_OTHER): Payer: Medicare Other

## 2017-01-13 VITALS — BP 147/46 | HR 60 | Temp 97.6°F | Resp 18 | Ht 72.0 in | Wt 153.3 lb

## 2017-01-13 DIAGNOSIS — C9000 Multiple myeloma not having achieved remission: Secondary | ICD-10-CM | POA: Diagnosis not present

## 2017-01-13 DIAGNOSIS — D61818 Other pancytopenia: Secondary | ICD-10-CM

## 2017-01-13 LAB — PREPARE RBC (CROSSMATCH)

## 2017-01-13 LAB — KAPPA/LAMBDA LIGHT CHAINS
Ig Kappa Free Light Chain: 292.3 mg/L — ABNORMAL HIGH (ref 3.3–19.4)
Ig Lambda Free Light Chain: 14 mg/L (ref 5.7–26.3)
Kappa/Lambda FluidC Ratio: 20.88 — ABNORMAL HIGH (ref 0.26–1.65)

## 2017-01-13 LAB — IGG, IGA, IGM
IgA, Qn, Serum: 437 mg/dL (ref 61–437)
IgG, Qn, Serum: 321 mg/dL — ABNORMAL LOW (ref 700–1600)
IgM, Qn, Serum: 15 mg/dL (ref 15–143)

## 2017-01-13 MED ORDER — SODIUM CHLORIDE 0.9 % IV SOLN
250.0000 mL | Freq: Once | INTRAVENOUS | Status: AC
  Administered 2017-01-13: 250 mL via INTRAVENOUS

## 2017-01-13 NOTE — Patient Instructions (Signed)

## 2017-01-13 NOTE — Telephone Encounter (Signed)
-----   Message from Ladell Pier, MD sent at 01/13/2017  1:46 PM EST ----- Please call daughter, the light chains are slightly improved and the IgA level is better, proceed with cycle 2 Revlimid

## 2017-01-13 NOTE — Telephone Encounter (Signed)
Called daughter with results per Dr. [name].

## 2017-01-13 NOTE — Progress Notes (Signed)
RN visit for blood transfusion

## 2017-01-14 ENCOUNTER — Telehealth: Payer: Self-pay | Admitting: Oncology

## 2017-01-14 ENCOUNTER — Ambulatory Visit (HOSPITAL_COMMUNITY)
Admission: RE | Admit: 2017-01-14 | Discharge: 2017-01-14 | Disposition: A | Discharge status: Home / Self Care | Payer: Medicare Other | Source: Ambulatory Visit | Attending: Oncology | Admitting: Oncology

## 2017-01-14 DIAGNOSIS — D61818 Other pancytopenia: Secondary | ICD-10-CM | POA: Diagnosis not present

## 2017-01-14 MED ORDER — SODIUM CHLORIDE 0.9 % IV SOLN: 250.0000 mL | Freq: Once | INTRAVENOUS | Status: DC

## 2017-01-14 NOTE — Progress Notes (Signed)
Diagnosis: Anemia  Physician: Dr. [name]  Procedure: Patient arrived to [facility] to receive one unit of PRBC.  Patient tolerated well.  Vitals stable upon discharge. Patient alert, orient and verbal and left via electric chair with nurse aide.

## 2017-01-14 NOTE — Discharge Instructions (Signed)

## 2017-01-14 NOTE — Telephone Encounter (Signed)
Spoke with patient regarding appts per 12/17 los

## 2017-01-15 LAB — PROTEIN ELECTROPHORESIS, SERUM
A/G RATIO SPE: 1.2 (ref 0.7–1.7)
ALBUMIN: 3.1 g/dL (ref 2.9–4.4)
Alpha 1: 0.3 g/dL (ref 0.0–0.4)
Alpha 2: 0.8 g/dL (ref 0.4–1.0)
Beta: 1.1 g/dL (ref 0.7–1.3)
GAMMA GLOBULIN: 0.3 g/dL — AB (ref 0.4–1.8)
Globulin, Total: 2.6 g/dL (ref 2.2–3.9)
M-Spike, %: 0.2 g/dL — ABNORMAL HIGH
TOTAL PROTEIN: 5.7 g/dL — AB (ref 6.0–8.5)

## 2017-01-15 LAB — TYPE AND SCREEN
ABO/RH(D): A POS
Antibody Screen: NEGATIVE
UNIT DIVISION: 0
Unit division: 0

## 2017-01-15 LAB — BPAM RBC
BLOOD PRODUCT EXPIRATION DATE: 201901052359
Blood Product Expiration Date: 201901082359
ISSUE DATE / TIME: 201812181413
ISSUE DATE / TIME: 201812191126
Unit Type and Rh: 6200
Unit Type and Rh: 6200

## 2017-01-21 DIAGNOSIS — E785 Hyperlipidemia, unspecified: Secondary | ICD-10-CM | POA: Insufficient documentation

## 2017-01-21 DIAGNOSIS — H3530 Unspecified macular degeneration: Secondary | ICD-10-CM | POA: Insufficient documentation

## 2017-01-21 DIAGNOSIS — H269 Unspecified cataract: Secondary | ICD-10-CM | POA: Insufficient documentation

## 2017-01-21 DIAGNOSIS — I2510 Atherosclerotic heart disease of native coronary artery without angina pectoris: Secondary | ICD-10-CM | POA: Insufficient documentation

## 2017-01-21 DIAGNOSIS — I252 Old myocardial infarction: Secondary | ICD-10-CM | POA: Insufficient documentation

## 2017-01-21 DIAGNOSIS — N400 Enlarged prostate without lower urinary tract symptoms: Secondary | ICD-10-CM | POA: Insufficient documentation

## 2017-01-21 DIAGNOSIS — K649 Unspecified hemorrhoids: Secondary | ICD-10-CM | POA: Insufficient documentation

## 2017-01-22 NOTE — Progress Notes (Signed)
No ICM remote transmission received for 01/15/2017 and next ICM transmission scheduled for 02/05/2017.

## 2017-01-28 ENCOUNTER — Telehealth: Payer: Self-pay | Admitting: *Deleted

## 2017-01-28 NOTE — Telephone Encounter (Signed)
Spoke with patient's daughter, [name] [facility]) to advise that patient's Merlin monitor is updated as of today and his appointment this afternoon is unnecessary unless any other issues have arisen.  Advised we will add a regularly-scheduled 91 day remote transmission to his already-scheduled 02/05/17 ICM remote.  [name] verbalizes understanding.  [name] requested recommendations as to whether it is safe for patient to wear a shirt with magnetic buttons.  Advised that this will likely have to be checked in person for interference, but that she can try calling tech services to see if it has been checked before.  Patient's daughter took tech services' number and our direct number and will call back if patient does not wish to keep appointment this afternoon.

## 2017-02-01 ENCOUNTER — Telehealth: Payer: Self-pay | Admitting: Internal Medicine

## 2017-02-01 ENCOUNTER — Other Ambulatory Visit: Payer: Self-pay

## 2017-02-01 ENCOUNTER — Emergency Department (HOSPITAL_COMMUNITY): Payer: Medicare Other

## 2017-02-01 ENCOUNTER — Encounter (HOSPITAL_COMMUNITY): Payer: Self-pay | Admitting: Emergency Medicine

## 2017-02-01 ENCOUNTER — Inpatient Hospital Stay (HOSPITAL_COMMUNITY)
Admission: EM | Admit: 2017-02-01 | Discharge: 2017-02-05 | DRG: 871 | Disposition: A | Discharge status: Home / Self Care | Payer: Medicare Other | Attending: Internal Medicine | Admitting: Internal Medicine

## 2017-02-01 DIAGNOSIS — E1122 Type 2 diabetes mellitus with diabetic chronic kidney disease: Secondary | ICD-10-CM | POA: Diagnosis present

## 2017-02-01 DIAGNOSIS — D72819 Decreased white blood cell count, unspecified: Secondary | ICD-10-CM | POA: Diagnosis not present

## 2017-02-01 DIAGNOSIS — D649 Anemia, unspecified: Secondary | ICD-10-CM

## 2017-02-01 DIAGNOSIS — D61818 Other pancytopenia: Secondary | ICD-10-CM | POA: Diagnosis not present

## 2017-02-01 DIAGNOSIS — N401 Enlarged prostate with lower urinary tract symptoms: Secondary | ICD-10-CM | POA: Diagnosis present

## 2017-02-01 DIAGNOSIS — J9621 Acute and chronic respiratory failure with hypoxia: Secondary | ICD-10-CM | POA: Diagnosis not present

## 2017-02-01 DIAGNOSIS — R509 Fever, unspecified: Secondary | ICD-10-CM | POA: Diagnosis not present

## 2017-02-01 DIAGNOSIS — Z87891 Personal history of nicotine dependence: Secondary | ICD-10-CM

## 2017-02-01 DIAGNOSIS — N179 Acute kidney failure, unspecified: Secondary | ICD-10-CM | POA: Diagnosis not present

## 2017-02-01 DIAGNOSIS — J441 Chronic obstructive pulmonary disease with (acute) exacerbation: Secondary | ICD-10-CM

## 2017-02-01 DIAGNOSIS — J439 Emphysema, unspecified: Secondary | ICD-10-CM | POA: Diagnosis present

## 2017-02-01 DIAGNOSIS — C9000 Multiple myeloma not having achieved remission: Secondary | ICD-10-CM | POA: Diagnosis not present

## 2017-02-01 DIAGNOSIS — E785 Hyperlipidemia, unspecified: Secondary | ICD-10-CM | POA: Diagnosis present

## 2017-02-01 DIAGNOSIS — I5032 Chronic diastolic (congestive) heart failure: Secondary | ICD-10-CM | POA: Diagnosis not present

## 2017-02-01 DIAGNOSIS — E1165 Type 2 diabetes mellitus with hyperglycemia: Secondary | ICD-10-CM | POA: Diagnosis present

## 2017-02-01 DIAGNOSIS — J210 Acute bronchiolitis due to respiratory syncytial virus: Secondary | ICD-10-CM | POA: Diagnosis present

## 2017-02-01 DIAGNOSIS — J111 Influenza due to unidentified influenza virus with other respiratory manifestations: Secondary | ICD-10-CM

## 2017-02-01 DIAGNOSIS — J9611 Chronic respiratory failure with hypoxia: Secondary | ICD-10-CM | POA: Diagnosis present

## 2017-02-01 DIAGNOSIS — N183 Chronic kidney disease, stage 3 unspecified: Secondary | ICD-10-CM | POA: Diagnosis present

## 2017-02-01 DIAGNOSIS — I255 Ischemic cardiomyopathy: Secondary | ICD-10-CM | POA: Diagnosis present

## 2017-02-01 DIAGNOSIS — Z7902 Long term (current) use of antithrombotics/antiplatelets: Secondary | ICD-10-CM

## 2017-02-01 DIAGNOSIS — Z7982 Long term (current) use of aspirin: Secondary | ICD-10-CM

## 2017-02-01 DIAGNOSIS — Z9981 Dependence on supplemental oxygen: Secondary | ICD-10-CM

## 2017-02-01 DIAGNOSIS — D469 Myelodysplastic syndrome, unspecified: Secondary | ICD-10-CM | POA: Diagnosis not present

## 2017-02-01 DIAGNOSIS — A419 Sepsis, unspecified organism: Principal | ICD-10-CM | POA: Diagnosis present

## 2017-02-01 DIAGNOSIS — R05 Cough: Secondary | ICD-10-CM | POA: Diagnosis not present

## 2017-02-01 DIAGNOSIS — Z7951 Long term (current) use of inhaled steroids: Secondary | ICD-10-CM

## 2017-02-01 DIAGNOSIS — R6520 Severe sepsis without septic shock: Secondary | ICD-10-CM | POA: Diagnosis present

## 2017-02-01 DIAGNOSIS — E861 Hypovolemia: Secondary | ICD-10-CM | POA: Diagnosis present

## 2017-02-01 DIAGNOSIS — B974 Respiratory syncytial virus as the cause of diseases classified elsewhere: Secondary | ICD-10-CM | POA: Diagnosis present

## 2017-02-01 DIAGNOSIS — I252 Old myocardial infarction: Secondary | ICD-10-CM

## 2017-02-01 DIAGNOSIS — Z79899 Other long term (current) drug therapy: Secondary | ICD-10-CM

## 2017-02-01 DIAGNOSIS — Z66 Do not resuscitate: Secondary | ICD-10-CM | POA: Diagnosis present

## 2017-02-01 DIAGNOSIS — I130 Hypertensive heart and chronic kidney disease with heart failure and stage 1 through stage 4 chronic kidney disease, or unspecified chronic kidney disease: Secondary | ICD-10-CM | POA: Diagnosis present

## 2017-02-01 DIAGNOSIS — Z95810 Presence of automatic (implantable) cardiac defibrillator: Secondary | ICD-10-CM

## 2017-02-01 DIAGNOSIS — J440 Chronic obstructive pulmonary disease with acute lower respiratory infection: Secondary | ICD-10-CM | POA: Diagnosis present

## 2017-02-01 DIAGNOSIS — R69 Illness, unspecified: Secondary | ICD-10-CM

## 2017-02-01 DIAGNOSIS — I2510 Atherosclerotic heart disease of native coronary artery without angina pectoris: Secondary | ICD-10-CM | POA: Diagnosis present

## 2017-02-01 DIAGNOSIS — H3530 Unspecified macular degeneration: Secondary | ICD-10-CM | POA: Diagnosis present

## 2017-02-01 LAB — COMPREHENSIVE METABOLIC PANEL
ALT: 11 U/L — AB (ref 17–63)
AST: 16 U/L (ref 15–41)
Albumin: 3.3 g/dL — ABNORMAL LOW (ref 3.5–5.0)
Alkaline Phosphatase: 47 U/L (ref 38–126)
Anion gap: 6 (ref 5–15)
BILIRUBIN TOTAL: 0.8 mg/dL (ref 0.3–1.2)
BUN: 30 mg/dL — AB (ref 6–20)
CO2: 28 mmol/L (ref 22–32)
CREATININE: 1.48 mg/dL — AB (ref 0.61–1.24)
Calcium: 8.5 mg/dL — ABNORMAL LOW (ref 8.9–10.3)
Chloride: 103 mmol/L (ref 101–111)
GFR, EST AFRICAN AMERICAN: 47 mL/min — AB (ref >60)
GFR, EST NON AFRICAN AMERICAN: 40 mL/min — AB (ref >60)
Glucose, Bld: 102 mg/dL — ABNORMAL HIGH (ref 65–99)
POTASSIUM: 4.4 mmol/L (ref 3.5–5.1)
Sodium: 137 mmol/L (ref 135–145)
TOTAL PROTEIN: 5.8 g/dL — AB (ref 6.5–8.1)

## 2017-02-01 LAB — CBC WITH DIFFERENTIAL/PLATELET
BASOS ABS: 0 10*3/uL (ref 0.0–0.1)
Basophils Relative: 0 %
EOS ABS: 0 10*3/uL (ref 0.0–0.7)
Eosinophils Relative: 3 %
HCT: 25.3 % — ABNORMAL LOW (ref 39.0–52.0)
Hemoglobin: 8.3 g/dL — ABNORMAL LOW (ref 13.0–17.0)
LYMPHS ABS: 0.4 10*3/uL — AB (ref 0.7–4.0)
Lymphocytes Relative: 28 %
MCH: 24.5 pg — AB (ref 26.0–34.0)
MCHC: 32.8 g/dL (ref 30.0–36.0)
MCV: 74.6 fL — ABNORMAL LOW (ref 78.0–100.0)
MONO ABS: 0.1 10*3/uL (ref 0.1–1.0)
Monocytes Relative: 6 %
NEUTROS ABS: 1 10*3/uL — AB (ref 1.7–7.7)
Neutrophils Relative %: 63 %
PLATELETS: 27 10*3/uL — AB (ref 150–400)
RBC: 3.39 MIL/uL — ABNORMAL LOW (ref 4.22–5.81)
RDW: 18.9 % — AB (ref 11.5–15.5)
WBC: 1.5 10*3/uL — ABNORMAL LOW (ref 4.0–10.5)

## 2017-02-01 LAB — URINALYSIS, ROUTINE W REFLEX MICROSCOPIC
BILIRUBIN URINE: NEGATIVE
Glucose, UA: NEGATIVE mg/dL
Hgb urine dipstick: NEGATIVE
Ketones, ur: NEGATIVE mg/dL
Nitrite: NEGATIVE
Protein, ur: NEGATIVE mg/dL
SPECIFIC GRAVITY, URINE: 1.015 (ref 1.005–1.030)
pH: 5 (ref 5.0–8.0)

## 2017-02-01 LAB — INFLUENZA PANEL BY PCR (TYPE A & B)
Influenza A By PCR: NEGATIVE
Influenza B By PCR: NEGATIVE

## 2017-02-01 LAB — I-STAT CG4 LACTIC ACID, ED: LACTIC ACID, VENOUS: 0.97 mmol/L (ref 0.5–1.9)

## 2017-02-01 LAB — PROTIME-INR
INR: 1.1
PROTHROMBIN TIME: 14.1 s (ref 11.4–15.2)

## 2017-02-01 MED ORDER — ACETAMINOPHEN 325 MG PO TABS
650.0000 mg | ORAL_TABLET | Freq: Once | ORAL | Status: AC
Start: 2017-02-01 — End: 2017-02-01
  Administered 2017-02-01: 650 mg via ORAL
  Filled 2017-02-01: qty 2

## 2017-02-01 MED ORDER — ALPRAZOLAM 0.25 MG PO TABS
0.2500 mg | ORAL_TABLET | Freq: Once | ORAL | Status: AC
  Administered 2017-02-01: 0.25 mg via ORAL
  Filled 2017-02-01: qty 1

## 2017-02-01 MED ORDER — SODIUM CHLORIDE 0.9 % IV BOLUS (SEPSIS)
1000.0000 mL | Freq: Once | INTRAVENOUS | Status: AC
  Administered 2017-02-01: 1000 mL via INTRAVENOUS

## 2017-02-01 MED ORDER — SODIUM CHLORIDE 0.9 % IV BOLUS (SEPSIS)
250.0000 mL | Freq: Once | INTRAVENOUS | Status: AC
  Administered 2017-02-01: 250 mL via INTRAVENOUS

## 2017-02-01 MED ORDER — DEXTROSE 5 % IV SOLN
1.0000 g | Freq: Once | INTRAVENOUS | Status: AC
  Administered 2017-02-01: 1 g via INTRAVENOUS
  Filled 2017-02-01: qty 10

## 2017-02-01 MED ORDER — DEXTROSE 5 % IV SOLN
500.0000 mg | Freq: Once | INTRAVENOUS | Status: AC
  Administered 2017-02-01: 500 mg via INTRAVENOUS
  Filled 2017-02-01: qty 500

## 2017-02-01 NOTE — ED Provider Notes (Addendum)
[facility] DEPT Provider Note   CSN: [id number] Arrival date & time: 02/01/17  1902     History   Chief Complaint Chief Complaint  Patient presents with  . 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  He is here for evaluation of respiratory symptoms which started today, first with rhinorrhea, then cough productive of white sputum.  He also complains of shortness of breath and chills.  There has been no vomiting, new weakness or dizziness.  No known sick contacts.  He did have an immunization for flu this year and recently had a pneumonia shot.  There are no other known modifying factors.  HPI  Past Medical History:  Diagnosis Date  . Anemia, unspecified   . CAD (coronary artery disease)   . Cataracts, bilateral   . CHF (congestive heart failure) ([place])   . Chronic airway obstruction, not elsewhere classified    on o2 at night  . Diabetes mellitus   . Diverticulosis of colon (without mention of hemorrhage)   . Dyslipidemia   . Emphysema   . Hemorrhoids   . ICD (implantable cardiac defibrillator) in place    st jude  . Ischemic cardiomyopathy   . Macular degeneration   . MI, old 2010 or 2011  . Prostatic hypertrophy    hx of  . Retention of urine, unspecified   . Unspecified disorder resulting from impaired renal function     Patient Active Problem List   Diagnosis Date Noted  . Prostatic hypertrophy   . MI, old   . Macular degeneration   . Hemorrhoids   . Dyslipidemia   . Cataracts, bilateral   . CAD (coronary artery disease)   . Anemia, unspecified   . Cough   . Pancytopenia ([place])   . Acute respiratory failure with hypoxemia ([place]) 11/27/2016  . Physical deconditioning   . Post-nasal drip   . Acute on chronic respiratory failure with hypoxia ([place]) 11/10/2016  . Acute renal failure superimposed on chronic kidney disease ([place]) 10/29/2016  . CAP (community acquired pneumonia) 10/29/2016  . Acute on chronic respiratory  acidosis 10/29/2016  . Lobar pneumonia ([place])   . Chronic diastolic CHF (congestive heart failure) ([place])   . Chronic respiratory failure with hypoxia ([place]) 10/24/2016  . Presbyesophagus   . Acute on chronic respiratory failure ([place])   . Microcytic anemia   . Dysphagia   . Cardiomyopathy, ischemic   . Sepsis ([place]) 06/11/2015  . COPD with exacerbation ([place]) 11/21/2013  . Diverticulitis of colon with perforation 11/21/2013  . Severe sepsis ([place]) 11/21/2013  . Adynamic ileus ([place]) 11/21/2013  . COPD exacerbation ([place]) 11/21/2013  . UTI (urinary tract infection), bacterial 11/21/2013  . Severe protein-calorie malnutrition ([place]) 11/21/2013  . Hypernatremia 11/17/2013  . Preop pulmonary/respiratory exam 11/15/2013  . Preoperative respiratory examination 11/14/2013  . Acute renal failure ([place]) 07/01/2013  . Low back pain 07/01/2013  . UTI (urinary tract infection) 07/01/2013  . Chronic systolic HF (heart failure) ([place]) 07/01/2013  . Dysphagia, unspecified(787.20) 10/27/2012  . Acute on chronic renal insufficiency 03/12/2012  . MGUS (monoclonal gammopathy of unknown significance) 03/12/2012  . Thrombocytopenia ([place]) 03/12/2012  . Beta thalassemia trait 03/12/2012  . UTI (lower urinary tract infection) 03/12/2012  . Gait instability 03/12/2012  . Fall at home 03/12/2012  . Automatic implantable cardioverter-defibrillator in situ 12/09/2011  . Ischemic cardiomyopathy 04/14/2011  . Cyanocobalamin deficiency 12/19/2010  . CHF (congestive heart failure) ([place]) 10/09/2010  . Edema  07/08/2010  . Dyspnea 07/08/2010  . CARDIAC ARREST 04/04/2010  . CONDUCTIVE HEARING LOSS OF COMBINED TYPES 10/24/2009  . KNEE PAIN 08/07/2009  . MI 06/07/2009  . DIABETES MELLITUS, TYPE II 06/06/2009  . DYSLIPIDEMIA 06/06/2009  . Anemia 06/06/2009  . Coronary atherosclerosis 06/06/2009  . Asthma vs vcd 06/06/2009  . DIVERTICULAR DISEASE 06/06/2009  . Chronic kidney disease, stage 3 ([place]) 06/06/2009  .  URINARY RETENTION 06/06/2009  . BENIGN PROSTATIC HYPERTROPHY, HX OF 06/06/2009    Past Surgical History:  Procedure Laterality Date  . CARDIAC DEFIBRILLATOR PLACEMENT  2011   st jude  . CATARACT EXTRACTION    . PACEMAKER INSERTION  2011  . pci     4/11  . TONSILLECTOMY  84 yo       Home Medications    Prior to Admission medications   Medication Sig Start Date End Date Taking? Authorizing Provider  albuterol (PROVENTIL) (2.5 MG/3ML) 0.083% nebulizer solution Take 3 mLs (2.5 mg total) every 2 (two) hours as needed by nebulization for wheezing or shortness of breath. 12/05/16  Yes Ghimire, Henreitta Leber, MD  ALPRAZolam Duanne Moron) 0.25 MG tablet Take 1 tablet (0.25 mg total) at bedtime as needed by mouth for sleep. 12/05/16  Yes Ghimire, Henreitta Leber, MD  arformoterol (BROVANA) 15 MCG/2ML NEBU Take 15 mcg by nebulization 2 (two) times daily.   Yes [provider]  aspirin EC 81 MG tablet Take 1 tablet (81 mg total) by mouth at bedtime. 11/27/13  Yes Dhungel, Nishant, MD  bisacodyl (DULCOLAX) 5 MG EC tablet Take 5 mg by mouth daily as needed for moderate constipation.   Yes [provider]  budesonide (PULMICORT) 0.5 MG/2ML nebulizer solution Take 2 mLs (0.5 mg total) by nebulization 2 (two) times daily. 08/31/12  Yes Parrett, Tammy S, NP  clopidogrel (PLAVIX) 75 MG tablet Take 75 mg by mouth daily.   Yes [provider]  ferrous sulfate 325 (65 FE) MG tablet Take 1 tablet (325 mg total) by mouth 2 (two) times daily with a meal. 11/14/16  Yes Ollis, Brandi L, NP  guaiFENesin (MUCINEX) 600 MG 12 hr tablet Take 600 mg by mouth 2 (two) times daily as needed for cough or to loosen phlegm.    Yes [provider]  lenalidomide (REVLIMID) 10 MG capsule Take 1 capsule (10 mg total) by mouth daily. Take with water for 21 days on, 7 days off, repeat every 28 days. 01/14/17  Yes Ladell Pier, MD  metoprolol tartrate (LOPRESSOR) 25 MG tablet Take 12.5 mg by mouth 2 (two) times  daily. Reported on 06/06/2015   Yes [provider]  OXYGEN Inhale 2 L into the lungs at bedtime.    Yes [provider]  pantoprazole (PROTONIX) 40 MG tablet Take 1 tablet (40 mg total) by mouth daily. 11/15/16  Yes Ollis, Brandi L, NP  rosuvastatin (CRESTOR) 5 MG tablet Take 1 tablet (5 mg total) by mouth daily at 6 PM. Patient taking differently: Take 5 mg by mouth at bedtime.  08/12/16  Yes Josue Hector, MD  sodium chloride (OCEAN) 0.65 % SOLN nasal spray Place 2 sprays into both nostrils 2 (two) times daily. 11/14/16  Yes Donita Brooks, NP  trimethoprim (TRIMPEX) 100 MG tablet Take 1 tablet (100 mg total) daily by mouth. 12/08/16  Yes Thurnell Lose, MD  albuterol (PROAIR HFA) 108 (90 Base) MCG/ACT inhaler Inhale 2 puffs into the lungs every 4 (four) hours as needed for wheezing or shortness  of breath. Patient not taking: Reported on 02/01/2017 12/26/16   Parrett, Fonnie Mu, NP  dexamethasone (DECADRON) 4 MG tablet Take 5 tablets (20mg ) by mouth once weekly with breakfast. Patient not taking: Reported on 02/01/2017 12/10/16   Ladell Pier, MD  fluticasone [facility]) 50 MCG/ACT nasal spray Place 1 spray into both nostrils daily. 11/15/16   Donita Brooks, NP  nitroGLYCERIN (NITROSTAT) 0.4 MG SL tablet Place 1 tablet (0.4 mg total) under the tongue every 5 (five) minutes as needed for chest pain. UP TO 3 DOSES THEN CALL EMS 11/02/16   Domenic Polite, MD  vitamin B-12 1000 MCG tablet Take 1 tablet (1,000 mcg total) daily by mouth. Patient not taking: Reported on 02/01/2017 12/05/16   Jonetta Osgood, MD  vitamin C (VITAMIN C) 250 MG tablet Take 1 tablet (250 mg total) by mouth 2 (two) times daily. Patient not taking: Reported on 02/01/2017 11/14/16   Donita Brooks, NP    Family History Family History  Problem Relation Age of Onset  . Coronary artery disease Mother   . Stroke Mother   . Congestive Heart Failure Mother   . Lung cancer Father        smoker  . Breast  cancer Sister   . Atrial fibrillation Daughter   . Heart attack Brother     Social History Social History   Tobacco Use  . Smoking status: Former Smoker    Types: Cigarettes    Last attempt to quit: 04/27/2009    Years since quitting: 7.7  . Smokeless tobacco: Never Used  Substance Use Topics  . Alcohol use: Yes    Comment: occasional beer  . Drug use: No     Allergies   Patient has no known allergies.   Review of Systems Review of Systems  All other systems reviewed and are negative.    Physical Exam Updated Vital Signs BP (!) 112/50   Pulse 65   Temp (!) 101.4 F (38.6 C) (Oral)   Resp 20   Ht 6' (1.829 m)   Wt 70.3 kg (155 lb)   SpO2 98%   BMI 21.02 kg/m   Physical Exam  Constitutional: He is oriented to person, place, and time. He appears well-developed. He appears distressed (He is uncomfortable).  Elderly, frail  HENT:  Head: Normocephalic and atraumatic.  Right Ear: External ear normal.  Left Ear: External ear normal.  Eyes: Conjunctivae and EOM are normal. Pupils are equal, round, and reactive to light.  Neck: Normal range of motion and phonation normal. Neck supple.  Cardiovascular: Normal rate, regular rhythm and normal heart sounds.  Pulmonary/Chest: Effort normal. No stridor. No respiratory distress. He exhibits no bony tenderness.  Rales at bases bilaterally.  No rhonchi.  Decreased air movement right side.  Abdominal: Soft. There is no tenderness.  Musculoskeletal: Normal range of motion.  Neurological: He is alert and oriented to person, place, and time. No cranial nerve deficit or sensory deficit. He exhibits normal muscle tone. Coordination normal.  Skin: Skin is warm, dry and intact.  Psychiatric: He has a normal mood and affect. His behavior is normal. Judgment and thought content normal.  Nursing note and vitals reviewed.    ED Treatments / Results  Labs (all labs ordered are listed, but only abnormal results are displayed) Labs  Reviewed  COMPREHENSIVE METABOLIC PANEL - Abnormal; Notable for the following components:      Result Value   Glucose, Bld 102 (*)    BUN  30 (*)    Creatinine, Ser 1.48 (*)    Calcium 8.5 (*)    Total Protein 5.8 (*)    Albumin 3.3 (*)    ALT 11 (*)    GFR calc non Af Amer 40 (*)    GFR calc Af Amer 47 (*)    All other components within normal limits  CBC WITH DIFFERENTIAL/PLATELET - Abnormal; Notable for the following components:   WBC 1.5 (*)    RBC 3.39 (*)    Hemoglobin 8.3 (*)    HCT 25.3 (*)    MCV 74.6 (*)    MCH 24.5 (*)    RDW 18.9 (*)    Platelets 27 (*)    Neutro Abs 1.0 (*)    Lymphs Abs 0.4 (*)    All other components within normal limits  URINALYSIS, ROUTINE W REFLEX MICROSCOPIC - Abnormal; Notable for the following components:   Leukocytes, UA SMALL (*)    Bacteria, UA RARE (*)    Squamous Epithelial / LPF 0-5 (*)    All other components within normal limits  CULTURE, BLOOD (ROUTINE X 2)  CULTURE, BLOOD (ROUTINE X 2)  URINE CULTURE  PROTIME-INR  INFLUENZA PANEL BY PCR (TYPE A & B)  I-STAT CG4 LACTIC ACID, ED    EKG  EKG Interpretation None       Radiology Dg Chest 2 View  Result Date: 02/01/2017 CLINICAL DATA:  Cough, congestion and fever EXAM: CHEST  2 VIEW COMPARISON:  12/02/2016 FINDINGS: Emphysematous hyperinflation of the lungs. Tortuous atherosclerotic aorta without aneurysm. Left-sided ICD device with leads in the right atrium and right ventricle. Normal size heart. No pneumonic consolidation, effusion or pneumothorax. Chronic scarring and/or atelectasis along the minor fissure. No overt pulmonary edema. Chronic degenerative changes are seen along the dorsal spine. IMPRESSION: 1. Emphysematous hyperinflation of the lungs. No active pulmonary disease. 2. Aortic atherosclerosis. Electronically Signed   By: Ashley Royalty M.D.   On: 02/01/2017 20:52    Procedures .Critical Care Performed by: Daleen Bo, MD Authorized by: Daleen Bo, MD    Critical care provider statement:    Critical care time (minutes):  45   Critical care start time:  02/01/2017 8:25 PM   Critical care end time:  02/01/2017 11:30 PM   Critical care time was exclusive of:  Separately billable procedures and treating other patients   Critical care was necessary to treat or prevent imminent or life-threatening deterioration of the following conditions:  Circulatory failure and respiratory failure   Critical care was time spent personally by me on the following activities:  Blood draw for specimens, development of treatment plan with patient or surrogate, discussions with consultants, examination of patient, obtaining history from patient or surrogate, ordering and performing treatments and interventions, ordering and review of laboratory studies, ordering and review of radiographic studies, pulse oximetry, re-evaluation of patient's condition and review of old charts   (including critical care time)  Medications Ordered in ED Medications  ALPRAZolam (XANAX) tablet 0.25 mg (not administered)  acetaminophen (TYLENOL) tablet 650 mg (650 mg Oral Given 02/01/17 2039)  sodium chloride 0.9 % bolus 1,000 mL (0 mLs Intravenous Stopped 02/01/17 2141)    And  sodium chloride 0.9 % bolus 1,000 mL (0 mLs Intravenous Stopped 02/01/17 2138)    And  sodium chloride 0.9 % bolus 250 mL (0 mLs Intravenous Stopped 02/01/17 2319)  cefTRIAXone (ROCEPHIN) 1 g in dextrose 5 % 50 mL IVPB (0 g Intravenous Stopped 02/01/17 2138)  azithromycin (  ZITHROMAX) 500 mg in dextrose 5 % 250 mL IVPB (0 mg Intravenous Stopped 02/01/17 2320)     Initial Impression / Assessment and Plan / ED Course  I have reviewed the triage vital signs and the nursing notes.  Pertinent labs & imaging results that were available during my care of the patient were reviewed by me and considered in my medical decision making (see chart for details).      Patient Vitals for the past 24 hrs:  BP Temp Temp src Pulse Resp SpO2  Height Weight  02/01/17 2320 - - - - 20 - - -  02/01/17 2309 (!) 112/50 - - 65 20 98 % - -  02/01/17 2056 101/87 - - 88 18 96 % - -  02/01/17 2039 - - - - - - 6' (1.829 m) 70.3 kg (155 lb)  02/01/17 2024 (!) 78/32 - - 68 15 97 % - -  02/01/17 1920 (!) 88/68 (!) 101.4 F (38.6 C) Oral 73 (!) 22 93 % - -    11:20 PM Reevaluation with update and discussion. After initial assessment and treatment, an updated evaluation reveals he states that he feels better.  He is able to sit up.  His family members feel like he is having more trouble breathing than usual.  Oxygen saturation on 2 L nasal cannula, 100%.  Lungs with somewhat diminished air movement bilaterally without audible wheezes rales or rhonchi.  Noted that patient is on chronic oxygen therapy at home and occasionally uses albuterol as a rescue inhaler.  Findings discussed with family members regarding overnight observation and they are agreeable. Daleen Bo   11:32 PM-Consult complete with hospitalist. Patient case explained and discussed.  He agrees to admit patient for further evaluation and treatment. Call ended at 11:46 PM  Final Clinical Impressions(s) / ED Diagnoses   Final diagnoses:  Influenza-like illness  Myelodysplasia (myelodysplastic syndrome) ([place])  Leukopenia, unspecified type  AKI (acute kidney injury) ([place])  Anemia, unspecified type   Elderly male with myelodysplastic disorder, COPD and acute illness, with sepsis/hypotension.  Blood pressure improved with IV fluid bolus, initial lactate normal.  Patient diagnosed with influenza-like illness.  Appears hemoconcentrated with acute kidney injury, creatinine 50% higher than baseline.  Anemia status post blood transfusion, 2-1/2 weeks ago.  Patient will require hospitalization for observation overnight, to ensure stability.  Nursing Notes Reviewed/ Care Coordinated Applicable Imaging Reviewed Interpretation of Laboratory Data incorporated into ED treatment  Plan:  [place]  ED Discharge Orders    None       Daleen Bo, MD 02/01/17 2346    Daleen Bo, MD 02/24/17 1228

## 2017-02-01 NOTE — ED Notes (Signed)
ED Provider at bedside. 

## 2017-02-01 NOTE — ED Triage Notes (Signed)
Family adds pt has been complaining today of being extremely thirsty

## 2017-02-01 NOTE — ED Notes (Signed)
Bed: WA10 Expected date:  Expected time:  Means of arrival:  Comments: Triage 2 

## 2017-02-01 NOTE — ED Triage Notes (Signed)
Family states pt is running a fever of 101.4 at home  Fever started today and has been congested for the past 3 days

## 2017-02-01 NOTE — Telephone Encounter (Signed)
Daughter Tayjon Halladay called - tem 101F, fever, congsestion.. She plans to take him to ER  Plan - I called ER 6:38 PM 02/01/2017 and passed information to Dr Daleen Bo   Dr. Brand Males, M.D., [facility].C.P Pulmonary and Critical Care Medicine Staff Physician, [place] Director - Interstitial Lung Disease  Program  Pulmonary [place] [facility] [place], [place], [address]  Pager: [phone], If no answer or between  15:00h - 7:00h: call [phone] Telephone: [phone]

## 2017-02-02 ENCOUNTER — Encounter: Payer: Self-pay | Admitting: *Deleted

## 2017-02-02 DIAGNOSIS — B974 Respiratory syncytial virus as the cause of diseases classified elsewhere: Secondary | ICD-10-CM

## 2017-02-02 DIAGNOSIS — R5081 Fever presenting with conditions classified elsewhere: Secondary | ICD-10-CM

## 2017-02-02 DIAGNOSIS — D61818 Other pancytopenia: Secondary | ICD-10-CM

## 2017-02-02 DIAGNOSIS — I130 Hypertensive heart and chronic kidney disease with heart failure and stage 1 through stage 4 chronic kidney disease, or unspecified chronic kidney disease: Secondary | ICD-10-CM | POA: Diagnosis present

## 2017-02-02 DIAGNOSIS — A419 Sepsis, unspecified organism: Secondary | ICD-10-CM | POA: Diagnosis not present

## 2017-02-02 DIAGNOSIS — J9611 Chronic respiratory failure with hypoxia: Secondary | ICD-10-CM | POA: Diagnosis not present

## 2017-02-02 DIAGNOSIS — J441 Chronic obstructive pulmonary disease with (acute) exacerbation: Secondary | ICD-10-CM | POA: Diagnosis not present

## 2017-02-02 DIAGNOSIS — N179 Acute kidney failure, unspecified: Secondary | ICD-10-CM | POA: Diagnosis not present

## 2017-02-02 DIAGNOSIS — H3530 Unspecified macular degeneration: Secondary | ICD-10-CM | POA: Diagnosis present

## 2017-02-02 DIAGNOSIS — J210 Acute bronchiolitis due to respiratory syncytial virus: Secondary | ICD-10-CM | POA: Diagnosis present

## 2017-02-02 DIAGNOSIS — E1122 Type 2 diabetes mellitus with diabetic chronic kidney disease: Secondary | ICD-10-CM | POA: Diagnosis present

## 2017-02-02 DIAGNOSIS — J440 Chronic obstructive pulmonary disease with acute lower respiratory infection: Secondary | ICD-10-CM | POA: Diagnosis present

## 2017-02-02 DIAGNOSIS — N401 Enlarged prostate with lower urinary tract symptoms: Secondary | ICD-10-CM | POA: Diagnosis present

## 2017-02-02 DIAGNOSIS — D649 Anemia, unspecified: Secondary | ICD-10-CM | POA: Diagnosis not present

## 2017-02-02 DIAGNOSIS — I5032 Chronic diastolic (congestive) heart failure: Secondary | ICD-10-CM | POA: Diagnosis not present

## 2017-02-02 DIAGNOSIS — Z9981 Dependence on supplemental oxygen: Secondary | ICD-10-CM | POA: Diagnosis not present

## 2017-02-02 DIAGNOSIS — J069 Acute upper respiratory infection, unspecified: Secondary | ICD-10-CM | POA: Diagnosis not present

## 2017-02-02 DIAGNOSIS — I2583 Coronary atherosclerosis due to lipid rich plaque: Secondary | ICD-10-CM | POA: Diagnosis not present

## 2017-02-02 DIAGNOSIS — R6520 Severe sepsis without septic shock: Secondary | ICD-10-CM | POA: Diagnosis not present

## 2017-02-02 DIAGNOSIS — D709 Neutropenia, unspecified: Secondary | ICD-10-CM | POA: Diagnosis not present

## 2017-02-02 DIAGNOSIS — C9000 Multiple myeloma not having achieved remission: Secondary | ICD-10-CM | POA: Diagnosis not present

## 2017-02-02 DIAGNOSIS — Z7982 Long term (current) use of aspirin: Secondary | ICD-10-CM | POA: Diagnosis not present

## 2017-02-02 DIAGNOSIS — N183 Chronic kidney disease, stage 3 (moderate): Secondary | ICD-10-CM | POA: Diagnosis not present

## 2017-02-02 DIAGNOSIS — R509 Fever, unspecified: Secondary | ICD-10-CM

## 2017-02-02 DIAGNOSIS — I255 Ischemic cardiomyopathy: Secondary | ICD-10-CM | POA: Diagnosis present

## 2017-02-02 DIAGNOSIS — J9621 Acute and chronic respiratory failure with hypoxia: Secondary | ICD-10-CM | POA: Diagnosis present

## 2017-02-02 DIAGNOSIS — Z87891 Personal history of nicotine dependence: Secondary | ICD-10-CM | POA: Diagnosis not present

## 2017-02-02 DIAGNOSIS — R69 Illness, unspecified: Secondary | ICD-10-CM | POA: Diagnosis not present

## 2017-02-02 DIAGNOSIS — E785 Hyperlipidemia, unspecified: Secondary | ICD-10-CM | POA: Diagnosis present

## 2017-02-02 DIAGNOSIS — E861 Hypovolemia: Secondary | ICD-10-CM | POA: Diagnosis present

## 2017-02-02 DIAGNOSIS — I252 Old myocardial infarction: Secondary | ICD-10-CM | POA: Diagnosis not present

## 2017-02-02 DIAGNOSIS — Z66 Do not resuscitate: Secondary | ICD-10-CM | POA: Diagnosis present

## 2017-02-02 DIAGNOSIS — Z7902 Long term (current) use of antithrombotics/antiplatelets: Secondary | ICD-10-CM | POA: Diagnosis not present

## 2017-02-02 DIAGNOSIS — Z95810 Presence of automatic (implantable) cardiac defibrillator: Secondary | ICD-10-CM | POA: Diagnosis not present

## 2017-02-02 DIAGNOSIS — I2510 Atherosclerotic heart disease of native coronary artery without angina pectoris: Secondary | ICD-10-CM | POA: Diagnosis not present

## 2017-02-02 LAB — CBC WITH DIFFERENTIAL/PLATELET
BASOS PCT: 1 %
Basophils Absolute: 0 10*3/uL (ref 0.0–0.1)
EOS ABS: 0 10*3/uL (ref 0.0–0.7)
EOS PCT: 0 %
HEMATOCRIT: 22.4 % — AB (ref 39.0–52.0)
HEMOGLOBIN: 7.1 g/dL — AB (ref 13.0–17.0)
LYMPHS PCT: 36 %
Lymphs Abs: 0.4 10*3/uL — ABNORMAL LOW (ref 0.7–4.0)
MCH: 23.5 pg — ABNORMAL LOW (ref 26.0–34.0)
MCHC: 31.7 g/dL (ref 30.0–36.0)
MCV: 74.2 fL — AB (ref 78.0–100.0)
MONOS PCT: 3 %
Monocytes Absolute: 0 10*3/uL — ABNORMAL LOW (ref 0.1–1.0)
NEUTROS ABS: 0.7 10*3/uL — AB (ref 1.7–7.7)
NEUTROS PCT: 60 %
PLATELETS: 22 10*3/uL — AB (ref 150–400)
RBC: 3.02 MIL/uL — ABNORMAL LOW (ref 4.22–5.81)
RDW: 19 % — ABNORMAL HIGH (ref 11.5–15.5)
WBC: 1.1 10*3/uL — CL (ref 4.0–10.5)

## 2017-02-02 LAB — RESPIRATORY PANEL BY PCR
ADENOVIRUS-RVPPCR: NOT DETECTED
Bordetella pertussis: NOT DETECTED
CORONAVIRUS 229E-RVPPCR: NOT DETECTED
CORONAVIRUS NL63-RVPPCR: NOT DETECTED
Chlamydophila pneumoniae: NOT DETECTED
Coronavirus HKU1: NOT DETECTED
Coronavirus OC43: NOT DETECTED
Influenza A: NOT DETECTED
Influenza B: NOT DETECTED
MYCOPLASMA PNEUMONIAE-RVPPCR: NOT DETECTED
Metapneumovirus: NOT DETECTED
PARAINFLUENZA VIRUS 1-RVPPCR: NOT DETECTED
PARAINFLUENZA VIRUS 2-RVPPCR: NOT DETECTED
PARAINFLUENZA VIRUS 4-RVPPCR: NOT DETECTED
Parainfluenza Virus 3: NOT DETECTED
Respiratory Syncytial Virus: DETECTED — AB
Rhinovirus / Enterovirus: NOT DETECTED

## 2017-02-02 LAB — PREPARE RBC (CROSSMATCH)

## 2017-02-02 LAB — STREP PNEUMONIAE URINARY ANTIGEN: Strep Pneumo Urinary Antigen: NEGATIVE

## 2017-02-02 LAB — CBG MONITORING, ED: Glucose-Capillary: 152 mg/dL — ABNORMAL HIGH (ref 65–99)

## 2017-02-02 LAB — EXPECTORATED SPUTUM ASSESSMENT W REFEX TO RESP CULTURE

## 2017-02-02 LAB — EXPECTORATED SPUTUM ASSESSMENT W GRAM STAIN, RFLX TO RESP C

## 2017-02-02 MED ORDER — METHYLPREDNISOLONE SODIUM SUCC 125 MG IJ SOLR
125.0000 mg | Freq: Once | INTRAMUSCULAR | Status: AC
  Administered 2017-02-02: 125 mg via INTRAVENOUS
  Filled 2017-02-02: qty 2

## 2017-02-02 MED ORDER — PIPERACILLIN-TAZOBACTAM 3.375 G IVPB 30 MIN
3.3750 g | Freq: Once | INTRAVENOUS | Status: AC
  Administered 2017-02-02: 3.375 g via INTRAVENOUS
  Filled 2017-02-02: qty 50

## 2017-02-02 MED ORDER — ONDANSETRON HCL 4 MG PO TABS: 4.0000 mg | ORAL_TABLET | Freq: Four times a day (QID) | ORAL | Status: DC | PRN

## 2017-02-02 MED ORDER — HYDROCODONE-ACETAMINOPHEN 5-325 MG PO TABS: 1.0000 | ORAL_TABLET | ORAL | Status: DC | PRN

## 2017-02-02 MED ORDER — VANCOMYCIN HCL IN DEXTROSE 1-5 GM/200ML-% IV SOLN
1000.0000 mg | Freq: Once | INTRAVENOUS | Status: AC
  Administered 2017-02-02: 1000 mg via INTRAVENOUS
  Filled 2017-02-02: qty 200

## 2017-02-02 MED ORDER — VANCOMYCIN HCL 10 G IV SOLR
1250.0000 mg | INTRAVENOUS | Status: DC
  Filled 2017-02-02: qty 1250

## 2017-02-02 MED ORDER — GUAIFENESIN ER 600 MG PO TB12
600.0000 mg | ORAL_TABLET | Freq: Two times a day (BID) | ORAL | Status: DC | PRN
  Administered 2017-02-02 – 2017-02-03 (×2): 600 mg via ORAL
  Filled 2017-02-02 (×2): qty 1

## 2017-02-02 MED ORDER — SODIUM CHLORIDE 0.9 % IV SOLN
Freq: Once | INTRAVENOUS | Status: AC
  Administered 2017-02-02: 19:00:00 via INTRAVENOUS

## 2017-02-02 MED ORDER — BUDESONIDE 0.5 MG/2ML IN SUSP
0.5000 mg | Freq: Two times a day (BID) | RESPIRATORY_TRACT | Status: DC
  Administered 2017-02-02 – 2017-02-05 (×7): 0.5 mg via RESPIRATORY_TRACT
  Filled 2017-02-02 (×8): qty 2

## 2017-02-02 MED ORDER — ALPRAZOLAM 0.25 MG PO TABS
0.2500 mg | ORAL_TABLET | Freq: Every evening | ORAL | Status: DC | PRN
  Administered 2017-02-02 – 2017-02-04 (×3): 0.25 mg via ORAL
  Filled 2017-02-02 (×3): qty 1

## 2017-02-02 MED ORDER — SALINE SPRAY 0.65 % NA SOLN
1.0000 | NASAL | Status: DC | PRN
  Administered 2017-02-02: 1 via NASAL
  Filled 2017-02-02: qty 44

## 2017-02-02 MED ORDER — FLUTICASONE PROPIONATE 50 MCG/ACT NA SUSP
1.0000 | Freq: Every day | NASAL | Status: DC
  Administered 2017-02-02 – 2017-02-04 (×3): 1 via NASAL
  Filled 2017-02-02 (×2): qty 16

## 2017-02-02 MED ORDER — PANTOPRAZOLE SODIUM 40 MG PO TBEC
40.0000 mg | DELAYED_RELEASE_TABLET | Freq: Every day | ORAL | Status: DC
  Administered 2017-02-02 – 2017-02-05 (×4): 40 mg via ORAL
  Filled 2017-02-02 (×5): qty 1

## 2017-02-02 MED ORDER — VANCOMYCIN HCL 10 G IV SOLR: 1250.0000 mg | INTRAVENOUS | Status: DC

## 2017-02-02 MED ORDER — ASPIRIN EC 81 MG PO TBEC
81.0000 mg | DELAYED_RELEASE_TABLET | Freq: Every day | ORAL | Status: DC
  Administered 2017-02-02: 81 mg via ORAL
  Filled 2017-02-02: qty 1

## 2017-02-02 MED ORDER — SODIUM CHLORIDE 0.9% FLUSH
3.0000 mL | Freq: Two times a day (BID) | INTRAVENOUS | Status: DC
  Administered 2017-02-02 – 2017-02-05 (×7): 3 mL via INTRAVENOUS

## 2017-02-02 MED ORDER — ALBUTEROL SULFATE (2.5 MG/3ML) 0.083% IN NEBU
2.5000 mg | INHALATION_SOLUTION | RESPIRATORY_TRACT | Status: DC | PRN
  Administered 2017-02-02 – 2017-02-04 (×3): 2.5 mg via RESPIRATORY_TRACT
  Filled 2017-02-02 (×3): qty 3

## 2017-02-02 MED ORDER — SALINE SPRAY 0.65 % NA SOLN
2.0000 | NASAL | Status: DC | PRN
  Filled 2017-02-02: qty 44

## 2017-02-02 MED ORDER — ARFORMOTEROL TARTRATE 15 MCG/2ML IN NEBU
15.0000 ug | INHALATION_SOLUTION | Freq: Two times a day (BID) | RESPIRATORY_TRACT | Status: DC
  Administered 2017-02-02 – 2017-02-05 (×7): 15 ug via RESPIRATORY_TRACT
  Filled 2017-02-02 (×10): qty 2

## 2017-02-02 MED ORDER — ONDANSETRON HCL 4 MG/2ML IJ SOLN: 4.0000 mg | Freq: Four times a day (QID) | INTRAMUSCULAR | Status: DC | PRN

## 2017-02-02 MED ORDER — ACETAMINOPHEN 650 MG RE SUPP: 650.0000 mg | Freq: Four times a day (QID) | RECTAL | Status: DC | PRN

## 2017-02-02 MED ORDER — LENALIDOMIDE 10 MG PO CAPS: 10.0000 mg | ORAL_CAPSULE | Freq: Every day | ORAL | Status: DC

## 2017-02-02 MED ORDER — BISACODYL 5 MG PO TBEC
5.0000 mg | DELAYED_RELEASE_TABLET | Freq: Every day | ORAL | Status: DC | PRN
  Administered 2017-02-03: 5 mg via ORAL
  Filled 2017-02-02: qty 1

## 2017-02-02 MED ORDER — PIPERACILLIN-TAZOBACTAM 3.375 G IVPB
3.3750 g | Freq: Three times a day (TID) | INTRAVENOUS | Status: DC
Start: 2017-02-02 — End: 2017-02-02
  Administered 2017-02-02 (×2): 3.375 g via INTRAVENOUS
  Filled 2017-02-02 (×3): qty 50

## 2017-02-02 MED ORDER — ACETAMINOPHEN 325 MG PO TABS: 650.0000 mg | ORAL_TABLET | Freq: Four times a day (QID) | ORAL | Status: DC | PRN

## 2017-02-02 MED ORDER — CLOPIDOGREL BISULFATE 75 MG PO TABS
75.0000 mg | ORAL_TABLET | Freq: Every day | ORAL | Status: DC
  Administered 2017-02-02: 75 mg via ORAL
  Filled 2017-02-02: qty 1

## 2017-02-02 MED ORDER — SODIUM CHLORIDE 0.9 % IV SOLN
INTRAVENOUS | Status: AC
  Administered 2017-02-02: 01:00:00 via INTRAVENOUS

## 2017-02-02 MED ORDER — DOXYCYCLINE HYCLATE 100 MG PO TABS
100.0000 mg | ORAL_TABLET | Freq: Two times a day (BID) | ORAL | Status: DC
  Administered 2017-02-02 – 2017-02-05 (×6): 100 mg via ORAL
  Filled 2017-02-02 (×6): qty 1

## 2017-02-02 MED ORDER — ROSUVASTATIN CALCIUM 5 MG PO TABS
5.0000 mg | ORAL_TABLET | Freq: Every day | ORAL | Status: DC
  Administered 2017-02-03 – 2017-02-04 (×2): 5 mg via ORAL
  Filled 2017-02-02 (×3): qty 1

## 2017-02-02 MED ORDER — METHYLPREDNISOLONE SODIUM SUCC 40 MG IJ SOLR
40.0000 mg | Freq: Three times a day (TID) | INTRAMUSCULAR | Status: DC
  Administered 2017-02-02 – 2017-02-04 (×6): 40 mg via INTRAVENOUS
  Filled 2017-02-02 (×6): qty 1

## 2017-02-02 NOTE — ED Notes (Signed)
PT IS POSITIVE FOR RSV CONTACT PRECAUTIONS

## 2017-02-02 NOTE — H&P (Signed)
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  Patient was recently exposed to grandchildren with upper respiratory illness, continue to be in his usual state until approximately 3 days ago, at which point he developed nasal congestion with rhinorrhea and increasing dyspnea.  The symptoms worsened over the ensuing days and he became febrile today.  Denies chest pain or swelling or tenderness in the lower extremities.  Has been using his home respiratory therapy without significant relief.  ED Course: Upon arrival to the ED, patient is found to be febrile to 38.6 C, saturating adequately on 2 L/min of supplemental oxygen, hypotensive with blood pressure 78/32, and vitals otherwise normal.  Chest x-rays negative for acute cardiopulmonary disease but notable for emphysematous changes.  Chemistry panel reveals a creatinine of 1.48, up from 1.0 last month.  CBC is notable for pancytopenia with WBC 1500, ANC 1000, hemoglobin stable at 8.3, and worsened thrombocytopenia with platelets now 27,000.  Lactic acid is reassuringly normal.  Patient was treated with Tylenol, blood cultures were collected, 30 cc/kg normal saline bolus was given, and he was treated with empiric Rocephin and azithromycin.  Blood pressure improved some, patient remains dyspneic but not in acute distress, and he will be admitted to the stepdown unit for ongoing evaluation and management of severe sepsis with suspected respiratory source and mild neutropenia.  Review of Systems:  All  other systems reviewed and apart from HPI, are negative.  Past Medical History:  Diagnosis Date  . Anemia, unspecified   . CAD (coronary artery disease)   . Cataracts, bilateral   . CHF (congestive heart failure) ([place])   . Chronic airway obstruction, not elsewhere classified    on o2 at night  . Diabetes mellitus   . Diverticulosis of colon (without mention of hemorrhage)   . Dyslipidemia   . Emphysema   . Hemorrhoids   . ICD (implantable cardiac defibrillator) in place    st jude  . Ischemic cardiomyopathy   . Macular degeneration   . MI, old 2010 or 2011  . Prostatic hypertrophy    hx of  . Retention of urine, unspecified   . Unspecified disorder resulting from impaired renal function     Past Surgical History:  Procedure Laterality Date  . CARDIAC DEFIBRILLATOR PLACEMENT  2011   [facility]  . CATARACT EXTRACTION    . PACEMAKER INSERTION  2011  . pci     4/11  . TONSILLECTOMY  84 yo     reports that he quit smoking about 7 years ago. His smoking use included cigarettes. he has never used smokeless tobacco. He reports that he drinks alcohol. He reports that he does not use drugs.  No Known Allergies  Family History  Problem Relation Age of Onset  . Coronary artery disease Mother   . Stroke Mother   . Congestive Heart Failure Mother   . Lung cancer Father        smoker  . Breast cancer Sister   . Atrial fibrillation Daughter   . Heart attack Brother      Prior  to Admission medications   Medication Sig Start Date End Date Taking? Authorizing Provider  albuterol (PROVENTIL) (2.5 MG/3ML) 0.083% nebulizer solution Take 3 mLs (2.5 mg total) every 2 (two) hours as needed by nebulization for wheezing or shortness of breath. 12/05/16  Yes Ghimire, Henreitta Leber, MD  ALPRAZolam Duanne Moron) 0.25 MG tablet Take 1 tablet (0.25 mg total) at bedtime as needed by mouth for sleep. 12/05/16  Yes Ghimire, Henreitta Leber, MD  arformoterol (BROVANA) 15 MCG/2ML NEBU Take 15 mcg by nebulization 2  (two) times daily.   Yes [provider]  aspirin EC 81 MG tablet Take 1 tablet (81 mg total) by mouth at bedtime. 11/27/13  Yes Dhungel, Nishant, MD  bisacodyl (DULCOLAX) 5 MG EC tablet Take 5 mg by mouth daily as needed for moderate constipation.   Yes [provider]  budesonide (PULMICORT) 0.5 MG/2ML nebulizer solution Take 2 mLs (0.5 mg total) by nebulization 2 (two) times daily. 08/31/12  Yes Parrett, Tammy S, NP  clopidogrel (PLAVIX) 75 MG tablet Take 75 mg by mouth daily.   Yes [provider]  ferrous sulfate 325 (65 FE) MG tablet Take 1 tablet (325 mg total) by mouth 2 (two) times daily with a meal. 11/14/16  Yes Ollis, Brandi L, NP  guaiFENesin (MUCINEX) 600 MG 12 hr tablet Take 600 mg by mouth 2 (two) times daily as needed for cough or to loosen phlegm.    Yes [provider]  lenalidomide (REVLIMID) 10 MG capsule Take 1 capsule (10 mg total) by mouth daily. Take with water for 21 days on, 7 days off, repeat every 28 days. 01/14/17  Yes Ladell Pier, MD  metoprolol tartrate (LOPRESSOR) 25 MG tablet Take 12.5 mg by mouth 2 (two) times daily. Reported on 06/06/2015   Yes [provider]  OXYGEN Inhale 2 L into the lungs at bedtime.    Yes [provider]  pantoprazole (PROTONIX) 40 MG tablet Take 1 tablet (40 mg total) by mouth daily. 11/15/16  Yes Ollis, Brandi L, NP  rosuvastatin (CRESTOR) 5 MG tablet Take 1 tablet (5 mg total) by mouth daily at 6 PM. Patient taking differently: Take 5 mg by mouth at bedtime.  08/12/16  Yes Josue Hector, MD  sodium chloride (OCEAN) 0.65 % SOLN nasal spray Place 2 sprays into both nostrils 2 (two) times daily. 11/14/16  Yes Donita Brooks, NP  trimethoprim (TRIMPEX) 100 MG tablet Take 1 tablet (100 mg total) daily by mouth. 12/08/16  Yes Thurnell Lose, MD  albuterol (PROAIR HFA) 108 (90 Base) MCG/ACT inhaler Inhale 2 puffs into the lungs every 4 (four) hours as needed for wheezing or shortness of  breath. Patient not taking: Reported on 02/01/2017 12/26/16   Parrett, Fonnie Mu, NP  fluticasone (FLONASE) 50 MCG/ACT nasal spray Place 1 spray into both nostrils daily. 11/15/16   Donita Brooks, NP  nitroGLYCERIN (NITROSTAT) 0.4 MG SL tablet Place 1 tablet (0.4 mg total) under the tongue every 5 (five) minutes as needed for chest pain. UP TO 3 DOSES THEN CALL EMS 11/02/16   Domenic Polite, MD  vitamin B-12 1000 MCG tablet Take 1 tablet (1,000 mcg total) daily by mouth. Patient not taking: Reported on 02/01/2017 12/05/16   Jonetta Osgood, MD  vitamin C (VITAMIN C) 250 MG tablet Take 1 tablet (250 mg total) by mouth 2 (two) times daily. Patient not taking: Reported on 02/01/2017 11/14/16   Donita Brooks, NP    Physical Exam:  Vitals:   02/01/17 2039 02/01/17 2056 02/01/17 2309 02/01/17 2320  BP:  101/87 (!) 112/50   Pulse:  88 65   Resp:  '18 20 20  '$ Temp:      TempSrc:      SpO2:  96% 98%   Weight: 70.3 kg (155 lb)     Height: 6' (1.829 m)         Constitutional: NAD, calm, in apparent discomfort Eyes: PERTLA, lids and conjunctivae normal ENMT: Mucous membranes are moist. Posterior pharynx clear of any exudate or lesions.   Neck: normal, supple, no masses, no thyromegaly Respiratory: Diminished breath sounds bilaterally with prolonged expiratory phase and wheezes. No accessory muscle use.  Cardiovascular: S1 & S2 heard, regular rate and rhythm. No significant JVD. Abdomen: No distension, no tenderness, no masses palpated. Bowel sounds normal.  Musculoskeletal: no clubbing / cyanosis. No joint deformity upper and lower extremities.    Skin: no significant rashes, lesions, ulcers. Warm, dry, well-perfused. Neurologic: CN 2-12 grossly intact. Sensation intact. Strength 5/5 in all 4 limbs. Gross hearing deficit.  Psychiatric:  Alert and oriented to person and place. Pleasant and cooperative.     Labs on Admission: I have personally reviewed following labs and imaging  studies  CBC: Recent Labs  Lab 02/01/17 2012  WBC 1.5*  NEUTROABS 1.0*  HGB 8.3*  HCT 25.3*  MCV 74.6*  PLT 27*   Basic Metabolic Panel: Recent Labs  Lab 02/01/17 2012  NA 137  K 4.4  CL 103  CO2 28  GLUCOSE 102*  BUN 30*  CREATININE 1.48*  CALCIUM 8.5*   GFR: Estimated Creatinine Clearance: 33.6 mL/min (A) (by C-G formula based on SCr of 1.48 mg/dL (H)). Liver Function Tests: Recent Labs  Lab 02/01/17 2012  AST 16  ALT 11*  ALKPHOS 47  BILITOT 0.8  PROT 5.8*  ALBUMIN 3.3*   No results for input(s): LIPASE, AMYLASE in the last 168 hours. No results for input(s): AMMONIA in the last 168 hours. Coagulation Profile: Recent Labs  Lab 02/01/17 2012  INR 1.10   Cardiac Enzymes: No results for input(s): CKTOTAL, CKMB, CKMBINDEX, TROPONINI in the last 168 hours. BNP (last 3 results) No results for input(s): PROBNP in the last 8760 hours. HbA1C: No results for input(s): HGBA1C in the last 72 hours. CBG: No results for input(s): GLUCAP in the last 168 hours. Lipid Profile: No results for input(s): CHOL, HDL, LDLCALC, TRIG, CHOLHDL, LDLDIRECT in the last 72 hours. Thyroid Function Tests: No results for input(s): TSH, T4TOTAL, FREET4, T3FREE, THYROIDAB in the last 72 hours. Anemia Panel: No results for input(s): VITAMINB12, FOLATE, FERRITIN, TIBC, IRON, RETICCTPCT in the last 72 hours. Urine analysis:    Component Value Date/Time   COLORURINE YELLOW 02/01/2017 2148   APPEARANCEUR CLEAR 02/01/2017 2148   LABSPEC 1.015 02/01/2017 2148   LABSPEC 1.025 05/01/2010 1454   PHURINE 5.0 02/01/2017 2148   GLUCOSEU NEGATIVE 02/01/2017 2148   HGBUR NEGATIVE 02/01/2017 2148   BILIRUBINUR NEGATIVE 02/01/2017 2148   BILIRUBINUR Negative 05/01/2010 [place] 02/01/2017 2148   PROTEINUR NEGATIVE 02/01/2017 2148   UROBILINOGEN 0.2 06/07/2014 2018   NITRITE NEGATIVE 02/01/2017 2148   LEUKOCYTESUR SMALL (A) 02/01/2017 2148   LEUKOCYTESUR Negative  05/01/2010 1454   Sepsis Labs: '@LABRCNTIP'$ (procalcitonin:4,lacticidven:4) )No results found for this or any previous visit (from the past 240 hour(s)).   Radiological Exams on Admission: Dg Chest 2 View  Result Date: 02/01/2017 CLINICAL DATA:  Cough, congestion and fever EXAM:  CHEST  2 VIEW COMPARISON:  12/02/2016 FINDINGS: Emphysematous hyperinflation of the lungs. Tortuous atherosclerotic aorta without aneurysm. Left-sided ICD device with leads in the right atrium and right ventricle. Normal size heart. No pneumonic consolidation, effusion or pneumothorax. Chronic scarring and/or atelectasis along the minor fissure. No overt pulmonary edema. Chronic degenerative changes are seen along the dorsal spine. IMPRESSION: 1. Emphysematous hyperinflation of the lungs. No active pulmonary disease. 2. Aortic atherosclerosis. Electronically Signed   By: Ashley Royalty M.D.   On: 02/01/2017 20:52    EKG: not performed.   Assessment/Plan  1. Severe sepsis; upper respiratory sxs  - Presents with fevers, sinus congestion, cough, SOB  - Found to be febrile with hypotension, worsened leukopenia, mild neutropenia  - CXR without conspicuous infiltrate  - Blood and urine cultures collected in ED, 30 cc/kg NS bolus given, and empiric Rocephin and azithromycin given  - Most likely source is respiratory, though no obvious pneumonia and flu swab negative; he had a recent hospitalization and is mildly neutropenic  - Plan to check sputum culture, strep pneumo antigen, and respiratory virus panel, continue empiric abx with vancomycin and Zosyn pending cultures and clinical course   2. COPD with acute exacerbation; chronic hypoxic respiratory failure - Presents with cough, congestion, SOB - Found to have wheezing and prolonged expiratory phase on admission  - Likely precipitated by infection  - Check sputum culture and respiratory virus panel, continue Symbicort and Brovana, continue prn albuterol nebs, start systemic  steroid, abx as above    3. Pancytopenia; multiple myeloma  - Under the care of oncology and managed with Revlimid  - WBC is 1,500 with ANC of 1,000; this is worse than priors and possible secondary to infection  - Hgb is stable at 8.3 and platelets have worsened to 27,000  - There is no bleeding evident on admission  - Type and screen, treat sepsis as above, repeat CBC in am    4.  Acute kidney injury superimposed on CKD stage II - SCr is 1.48 on admission, up from 1.0 two weeks earlier  - Likely a prerenal azotemia in setting of sepsis with hypotension; ATN possible  - Fluid-resuscitated in ED  - Renally-dose medications, repeat chem panel in am    5. Chronic diastolic CHF  - Hypovolemic on admission in setting of acute febrile illness  - Treated with 2.5 liters NS in ED and continued IVF hydration  - Follow I/O's and daily wts, hold beta-blocker until BP improves    6. CAD - No anginal complaints  - Continue ASA, Plavix, Crestor   - Hold Lopressor until BP improves     DVT prophylaxis: SCD's Code Status: DNR Family Communication: Daughter updated at bedside Disposition Plan: Admit to SDU Consults called: None Admission status: Inpatient    Vianne Bulls, MD [facility] Pager [phone]  If 7PM-7AM, please contact night-coverage [email or web address] Password [email or web address]  02/02/2017, 12:05 AM

## 2017-02-02 NOTE — ED Notes (Signed)
ADMISSION MADERA MD Provider at bedside.

## 2017-02-02 NOTE — ED Notes (Signed)
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 May use oxygen, suction and manual treatment of airway obstruction as needed for comfort.      02/02/17 0005    Code Status History    Date Active Date Inactive Code Status Order ID Comments User Context   11/28/2016 11:30 12/08/2016 16:53 DNR [id number]  Juanito Doom, MD Inpatient   11/27/2016 16:44 11/28/2016 11:30 Full Code [id number]  Melvenia Needles, NP Inpatient   11/10/2016 17:35 11/14/2016 18:42 Full Code [id number]  Javier Glazier, MD Inpatient   10/29/2016 09:52 11/02/2016 14:42 Full Code [id number]  Radene Gunning, NP ED   06/11/2015 07:27 06/20/2015 21:06 Full Code [id number]  Edwin Dada, MD Inpatient   11/13/2013 10:35 11/22/2013 15:26 Full Code [id number]  Orson Eva, MD Inpatient   07/01/2013 11:22 07/04/2013 12:43 Full Code [id number]  Erline Hau, MD Inpatient   03/12/2012 19:22 03/17/2012 13:40 Full Code [id number]  Janece Canterbury, MD Inpatient    Advance Directive Documentation     Most Recent Value  Type of Advance Directive  Healthcare Power of Attorney, Living will  Pre-existing out of facility DNR order (yellow form or pink MOST form)  No data  "MOST" Form in Place?  No data      Home/SNF/Other Home  Chief Complaint Ca pt, Fever 101, Cough, Congestion  Level of Care/Admitting Diagnosis ED Disposition    ED Disposition Condition [facility]  Area: [place] [[id number]]  Level of Care: Stepdown [14]  Admit to SDU based on following criteria: Hemodynamic compromise or significant risk of instability:  Patient requiring short term acute titration and management of vasoactive drips, and invasive monitoring (i.e., CVP and Arterial line).  Diagnosis: Severe sepsis [facility]) [[id number]]  Admitting Physician: Vianne Bulls [[id number]]  Attending Physician: Vianne Bulls [[id number]]  Estimated length of stay: past midnight tomorrow  Certification:: I certify this patient will need inpatient services for at least 2 midnights  PT Class (Do Not Modify): Inpatient [101]  PT Acc Code (Do Not Modify): Private [1]       Medical History Past Medical History:  Diagnosis Date  . Anemia, unspecified   . CAD (coronary artery disease)   . Cataracts, bilateral   . CHF (congestive heart failure) ([place])   . Chronic airway obstruction, not elsewhere classified    on o2 at night  . Diabetes mellitus   . Diverticulosis of colon (without mention of hemorrhage)   . Dyslipidemia   . Emphysema   . Hemorrhoids   . ICD (implantable cardiac defibrillator) in place    st jude  . Ischemic cardiomyopathy   . Macular degeneration   . MI, old 2010 or 2011  . Prostatic hypertrophy    hx of  . Retention of urine, unspecified   . Unspecified disorder resulting from  impaired renal function     Allergies No Known Allergies  IV Location/Drains/Wounds Patient Lines/Drains/Airways Status   Active Line/Drains/Airways    Name:   Placement date:   Placement time:   Site:   Days:   Peripheral IV 02/01/17 Right Forearm   02/01/17    2038    Forearm   1   Peripheral IV 02/01/17 Left Forearm   02/01/17    2048    Forearm   1   Wound / Incision (Open or Dehisced) 06/11/15 Other (Comment) Elbow Left skin tear secondary to patient moving himself in bed    06/11/15    0809    Elbow   602          Labs/Imaging Results for orders placed or  performed during the hospital encounter of 02/01/17 (from the past 48 hour(s))  Comprehensive metabolic panel     Status: Abnormal   Collection Time: 02/01/17  8:12 PM  Result Value Ref Range   Sodium 137 135 - 145 mmol/L   Potassium 4.4 3.5 - 5.1 mmol/L   Chloride 103 101 - 111 mmol/L   CO2 28 22 - 32 mmol/L   Glucose, Bld 102 (H) 65 - 99 mg/dL   BUN 30 (H) 6 - 20 mg/dL   Creatinine, Ser 1.48 (H) 0.61 - 1.24 mg/dL   Calcium 8.5 (L) 8.9 - 10.3 mg/dL   Total Protein 5.8 (L) 6.5 - 8.1 g/dL   Albumin 3.3 (L) 3.5 - 5.0 g/dL   AST 16 15 - 41 U/L   ALT 11 (L) 17 - 63 U/L   Alkaline Phosphatase 47 38 - 126 U/L   Total Bilirubin 0.8 0.3 - 1.2 mg/dL   GFR calc non Af Amer 40 (L) >60 mL/min   GFR calc Af Amer 47 (L) >60 mL/min    Comment: (NOTE) The eGFR has been calculated using the CKD EPI equation. This calculation has not been validated in all clinical situations. eGFR's persistently <60 mL/min signify possible Chronic Kidney Disease.    Anion gap 6 5 - 15  CBC with Differential     Status: Abnormal   Collection Time: 02/01/17  8:12 PM  Result Value Ref Range   WBC 1.5 (L) 4.0 - 10.5 K/uL   RBC 3.39 (L) 4.22 - 5.81 MIL/uL   Hemoglobin 8.3 (L) 13.0 - 17.0 g/dL   HCT 25.3 (L) 39.0 - 52.0 %   MCV 74.6 (L) 78.0 - 100.0 fL   MCH 24.5 (L) 26.0 - 34.0 pg   MCHC 32.8 30.0 - 36.0 g/dL   RDW 18.9 (H) 11.5 - 15.5 %   Platelets 27 (LL) 150 - 400 K/uL    Comment: REPEATED TO VERIFY SPECIMEN CHECKED FOR CLOTS PLATELET COUNT CONFIRMED BY SMEAR CRITICAL RESULT CALLED TO, READ BACK BY AND VERIFIED WITH: Christean Grief [id number] @ 2145 BY J SCOTTON    Neutrophils Relative % 63 %   Lymphocytes Relative 28 %   Monocytes Relative 6 %   Eosinophils Relative 3 %   Basophils Relative 0 %   Neutro Abs 1.0 (L) 1.7 - 7.7 K/uL   Lymphs Abs 0.4 (L) 0.7 - 4.0 K/uL   Monocytes Absolute 0.1 0.1 - 1.0 K/uL   Eosinophils Absolute 0.0 0.0 - 0.7 K/uL   Basophils Absolute 0.0 0.0 - 0.1 K/uL   RBC  Morphology ELLIPTOCYTES    Smear Review PLATELET COUNT CONFIRMED BY SMEAR   Protime-INR     Status: None  Collection Time: 02/01/17  8:12 PM  Result Value Ref Range   Prothrombin Time 14.1 11.4 - 15.2 seconds   INR 1.10   I-Stat CG4 Lactic Acid, ED     Status: None   Collection Time: 02/01/17  8:21 PM  Result Value Ref Range   Lactic Acid, Venous 0.97 0.5 - 1.9 mmol/L  Influenza panel by PCR (type A & B)     Status: None   Collection Time: 02/01/17  8:43 PM  Result Value Ref Range   Influenza A By PCR NEGATIVE NEGATIVE   Influenza B By PCR NEGATIVE NEGATIVE    Comment: (NOTE) The Xpert Xpress Flu assay is intended as an aid in the diagnosis of  influenza and should not be used as a sole basis for treatment.  This  assay is FDA approved for nasopharyngeal swab specimens only. Nasal  washings and aspirates are unacceptable for Xpert Xpress Flu testing.   Urinalysis, Routine w reflex microscopic     Status: Abnormal   Collection Time: 02/01/17  9:48 PM  Result Value Ref Range   Color, Urine YELLOW YELLOW   APPearance CLEAR CLEAR   Specific Gravity, Urine 1.015 1.005 - 1.030   pH 5.0 5.0 - 8.0   Glucose, UA NEGATIVE NEGATIVE mg/dL   Hgb urine dipstick NEGATIVE NEGATIVE   Bilirubin Urine NEGATIVE NEGATIVE   Ketones, ur NEGATIVE NEGATIVE mg/dL   Protein, ur NEGATIVE NEGATIVE mg/dL   Nitrite NEGATIVE NEGATIVE   Leukocytes, UA SMALL (A) NEGATIVE   RBC / HPF 0-5 0 - 5 RBC/hpf   WBC, UA 6-30 0 - 5 WBC/hpf   Bacteria, UA RARE (A) NONE SEEN   Squamous Epithelial / LPF 0-5 (A) NONE SEEN   Mucus PRESENT   Strep pneumoniae urinary antigen     Status: None   Collection Time: 02/02/17 12:05 AM  Result Value Ref Range   Strep Pneumo Urinary Antigen NEGATIVE NEGATIVE    Comment:        Infection due to S. pneumoniae cannot be absolutely ruled out since the antigen present may be below the detection limit of the test. Performed at [facility], [address], [place],  [place] [address]   Type and screen [place]     Status: None (Preliminary result)   Collection Time: 02/02/17 12:15 AM  Result Value Ref Range   ABO/RH(D) A POS    Antibody Screen NEG    Sample Expiration 02/05/2017    Unit Number [id number]    Blood Component Type RED CELLS,LR    Unit division 00    Status of Unit ISSUED    Transfusion Status OK TO TRANSFUSE    Crossmatch Result Compatible   CBC with Differential     Status: Abnormal   Collection Time: 02/02/17  4:40 AM  Result Value Ref Range   WBC 1.1 (LL) 4.0 - 10.5 K/uL    Comment: CRITICAL RESULT CALLED TO, READ BACK BY AND VERIFIED WITH: A MARQUEZ,RN _0  02/02/17 MKELLY    RBC 3.02 (L) 4.22 - 5.81 MIL/uL   Hemoglobin 7.1 (L) 13.0 - 17.0 g/dL   HCT 22.4 (L) 39.0 - 52.0 %   MCV 74.2 (L) 78.0 - 100.0 fL   MCH 23.5 (L) 26.0 - 34.0 pg   MCHC 31.7 30.0 - 36.0 g/dL   RDW 19.0 (H) 11.5 - 15.5 %   Platelets 22 (LL) 150 - 400 K/uL    Comment: CRITICAL VALUE NOTED.  VALUE  IS CONSISTENT WITH PREVIOUSLY REPORTED AND CALLED VALUE.   Neutrophils Relative % 60 %   Lymphocytes Relative 36 %   Monocytes Relative 3 %   Eosinophils Relative 0 %   Basophils Relative 1 %   Neutro Abs 0.7 (L) 1.7 - 7.7 K/uL   Lymphs Abs 0.4 (L) 0.7 - 4.0 K/uL   Monocytes Absolute 0.0 (L) 0.1 - 1.0 K/uL   Eosinophils Absolute 0.0 0.0 - 0.7 K/uL   Basophils Absolute 0.0 0.0 - 0.1 K/uL   RBC Morphology POLYCHROMASIA PRESENT     Comment: ELLIPTOCYTES   WBC Morphology WHITE COUNT CONFIRMED ON SMEAR   Respiratory Panel by PCR     Status: Abnormal   Collection Time: 02/02/17  4:43 AM  Result Value Ref Range   Adenovirus NOT DETECTED NOT DETECTED   Coronavirus 229E NOT DETECTED NOT DETECTED   Coronavirus HKU1 NOT DETECTED NOT DETECTED   Coronavirus NL63 NOT DETECTED NOT DETECTED   Coronavirus OC43 NOT DETECTED NOT DETECTED   Metapneumovirus NOT DETECTED NOT DETECTED   Rhinovirus / Enterovirus NOT DETECTED NOT DETECTED   Influenza A NOT  DETECTED NOT DETECTED   Influenza B NOT DETECTED NOT DETECTED   Parainfluenza Virus 1 NOT DETECTED NOT DETECTED   Parainfluenza Virus 2 NOT DETECTED NOT DETECTED   Parainfluenza Virus 3 NOT DETECTED NOT DETECTED   Parainfluenza Virus 4 NOT DETECTED NOT DETECTED   Respiratory Syncytial Virus DETECTED (A) NOT DETECTED    Comment: CRITICAL RESULT CALLED TO, READ BACK BY AND VERIFIED WITH: AEarlie Counts RN 13:45 02/02/17 ([email or web address])    Bordetella pertussis NOT DETECTED NOT DETECTED   Chlamydophila pneumoniae NOT DETECTED NOT DETECTED   Mycoplasma pneumoniae NOT DETECTED NOT DETECTED  CBG monitoring, ED     Status: Abnormal   Collection Time: 02/02/17  7:56 AM  Result Value Ref Range   Glucose-Capillary 152 (H) 65 - 99 mg/dL  Prepare RBC     Status: None   Collection Time: 02/02/17  6:00 PM  Result Value Ref Range   Order Confirmation ORDER PROCESSED BY BLOOD BANK    Dg Chest 2 View  Result Date: 02/01/2017 CLINICAL DATA:  Cough, congestion and fever EXAM: CHEST  2 VIEW COMPARISON:  12/02/2016 FINDINGS: Emphysematous hyperinflation of the lungs. Tortuous atherosclerotic aorta without aneurysm. Left-sided ICD device with leads in the right atrium and right ventricle. Normal size heart. No pneumonic consolidation, effusion or pneumothorax. Chronic scarring and/or atelectasis along the minor fissure. No overt pulmonary edema. Chronic degenerative changes are seen along the dorsal spine. IMPRESSION: 1. Emphysematous hyperinflation of the lungs. No active pulmonary disease. 2. Aortic atherosclerosis. Electronically Signed   By: Ashley Royalty M.D.   On: 02/01/2017 20:52    Pending Labs Unresulted Labs (From admission, onward)   Start     Ordered   02/03/17 0500  Creatinine, serum  Daily,   R     02/02/17 1106   02/03/17 0500  CBC with Differential  Tomorrow morning,   R     02/02/17 1433   02/03/17 2595  Basic metabolic panel  Tomorrow morning,   R     02/02/17 1741   02/02/17 1437  Protein  electrophoresis, serum  Once,   R    Comments:  Add to 02/01/2017 labs    02/02/17 1436   02/02/17 1436  IgG, IgA, IgM  Once,   R    Comments:  Add to 02/01/2017 labs    02/02/17 1436   02/02/17 1436  Kappa/lambda light chains  Once,   R    Comments:  Added to 02/01/2017 labs    02/02/17 1436   02/01/17 2358  Culture, sputum-assessment  Once,   R    Question:  Patient immune status  Answer:  Immunocompromised   02/02/17 0005   02/01/17 2042  Urine culture  STAT,   STAT     02/01/17 2043   02/01/17 1931  Culture, blood (Routine x 2)  BLOOD CULTURE X 2,   STAT     02/01/17 1930      Vitals/Pain Today's Vitals   02/02/17 2030 02/02/17 2055 02/02/17 2111 02/02/17 2115  BP: (!) 104/53 (!) 112/38 (!) 112/38 (!) 99/37  Pulse: (!) 56 73 79 69  Resp: _0 Temp:  97.7 F (36.5 C)  98 F (36.7 C)  TempSrc:  Oral  Oral  SpO2: 100%  97% 100%  Weight:      Height:      PainSc:        Isolation Precautions Protective Precautions  Medications Medications  albuterol (PROVENTIL) (2.5 MG/3ML) 0.083% nebulizer solution 2.5 mg (2.5 mg Nebulization Given 02/02/17 0121)  ALPRAZolam (XANAX) tablet 0.25 mg (not administered)  arformoterol (BROVANA) nebulizer solution 15 mcg (15 mcg Nebulization Given 02/02/17 2028)  bisacodyl (DULCOLAX) EC tablet 5 mg (not administered)  budesonide (PULMICORT) nebulizer solution 0.5 mg (0.5 mg Nebulization Given 02/02/17 2028)  guaiFENesin (MUCINEX) 12 hr tablet 600 mg (not administered)  pantoprazole (PROTONIX) EC tablet 40 mg (40 mg Oral Given 02/02/17 1054)  rosuvastatin (CRESTOR) tablet 5 mg (not administered)  sodium chloride (OCEAN) 0.65 % nasal spray 2 spray (not administered)  sodium chloride flush (NS) 0.9 % injection 3 mL (3 mLs Intravenous Given 02/02/17 0931)  0.9 %  sodium chloride infusion ( Intravenous Not Given 02/02/17 2108)  acetaminophen (TYLENOL) tablet 650 mg (not administered)    Or  acetaminophen (TYLENOL) suppository 650 mg (not  administered)  HYDROcodone-acetaminophen (NORCO/VICODIN) 5-325 MG per tablet 1-2 tablet (not administered)  ondansetron (ZOFRAN) tablet 4 mg (not administered)    Or  ondansetron (ZOFRAN) injection 4 mg (not administered)  methylPREDNISolone sodium succinate (SOLU-MEDROL) 40 mg/mL injection 40 mg (40 mg Intravenous Given 02/02/17 1107)  doxycycline (VIBRA-TABS) tablet 100 mg (not administered)  fluticasone (FLONASE) 50 MCG/ACT nasal spray 1 spray (1 spray Each Nare Given 02/02/17 1917)  sodium chloride (OCEAN) 0.65 % nasal spray 1 spray (1 spray Each Nare Given 02/02/17 1915)  acetaminophen (TYLENOL) tablet 650 mg (650 mg Oral Given 02/01/17 2039)  sodium chloride 0.9 % bolus 1,000 mL (0 mLs Intravenous Stopped 02/01/17 2141)    And  sodium chloride 0.9 % bolus 1,000 mL (0 mLs Intravenous Stopped 02/01/17 2138)    And  sodium chloride 0.9 % bolus 250 mL (0 mLs Intravenous Stopped 02/01/17 2319)  cefTRIAXone (ROCEPHIN) 1 g in dextrose 5 % 50 mL IVPB (0 g Intravenous Stopped 02/01/17 2138)  azithromycin (ZITHROMAX) 500 mg in dextrose 5 % 250 mL IVPB (0 mg Intravenous Stopped 02/01/17 2320)  ALPRAZolam (XANAX) tablet 0.25 mg (0.25 mg Oral Given 02/01/17 2348)  piperacillin-tazobactam (ZOSYN) IVPB 3.375 g (0 g Intravenous Stopped 02/02/17 0228)  vancomycin (VANCOCIN) IVPB 1000 mg/200 mL premix (0 mg Intravenous Stopped 02/02/17 0328)  methylPREDNISolone sodium succinate (SOLU-MEDROL) 125 mg/2 mL injection 125 mg (125 mg Intravenous Given 02/02/17 0139)  0.9 %  sodium chloride infusion ( Intravenous New Bag/Given 02/02/17 1918)    Mobility walks with device

## 2017-02-02 NOTE — ED Notes (Signed)
[facility] MD Provider at bedside.

## 2017-02-02 NOTE — Progress Notes (Signed)
Patient seen and examined.  Admitted after midnight secondary to shortness of breath, fever and general malaise.  Patient found to have RSV infection.  Case has been discussed with Dr. Graylon Good and at this moment the recommendation has been to stop  Broad-spectrum antibiotic treatment.  Continue supportive care, continue treatment for his COPD exacerbation and use doxycycline.  We will follow cultures and clinical response.  Follow recommendation by oncology service will transfuse 1 unit of PRBCs and discontinue any heparin products given his thrombocytopenia. Please refer to H&P written by Dr. Mitzi Hansen for any further info/details on admission.    Barton Dubois MD [phone]

## 2017-02-02 NOTE — ED Notes (Signed)
REVLIMID MEDICATION FROM [facility] ADMINISTERED

## 2017-02-02 NOTE — Progress Notes (Signed)
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  His daughter reports he has been weaker for several days.  No bleeding. He has 3 more days of Revlimid to complete the current cycle started on 01/14/2017.  Objective: Vital signs in last 24 hours: Blood pressure (!) 102/50, pulse 60, temperature 98.2 F (36.8 C), temperature source Oral, resp. rate 18, height 6' (1.829 m), weight 155 lb (70.3 kg), SpO2 97 %.  Intake/Output from previous day: 01/06 0701 - 01/07 0700 In: 2650 [IV Piggyback:2650] Out: -   Physical Exam:  HEENT: No thrush or bleeding Lungs: Distant breath sounds, clear, no respiratory distress Cardiac: Distant heart sounds Abdomen: No hepatosplenomegaly, nontender Extremities: Trace lower leg edema bilaterally Skin: Changes of steroid purpura at the forearm bilaterally   Lab Results: Recent Labs    02/01/17 2012 02/02/17 0440  WBC 1.5* 1.1*  HGB 8.3* 7.1*  HCT 25.3* 22.4*  PLT 27* 22*    BMET Recent Labs    02/01/17 2012  NA 137  K 4.4  CL 103  CO2 28  GLUCOSE 102*  BUN 30*  CREATININE 1.48*  CALCIUM 8.5*    No results found for: CEA1  Studies/Results: Dg Chest 2 View  Result Date: 02/01/2017 CLINICAL DATA:  Cough, congestion and fever EXAM: CHEST  2 VIEW COMPARISON:  12/02/2016 FINDINGS: Emphysematous hyperinflation of the lungs. Tortuous atherosclerotic aorta without aneurysm. Left-sided ICD device with leads in the right atrium and right ventricle. Normal size heart. No pneumonic consolidation, effusion or pneumothorax. Chronic scarring and/or atelectasis along the minor fissure. No overt pulmonary edema. Chronic degenerative changes are seen along the dorsal spine. IMPRESSION: 1. Emphysematous hyperinflation of the lungs. No active pulmonary disease. 2. Aortic atherosclerosis. Electronically Signed   By: Ashley Royalty M.D.   On: 02/01/2017 20:52    Medications: I  have reviewed the patient's current medications.  Assessment/Plan: 1. Pancytopenia-transfused with packed red blood cells 11/28/2016, 12/27/2016, 01/13/2017 2. History of beta thalassemia trait 3. History of vitamin B-12 deficiency-vitamin B-12 replacement initiated 11/11/2016 4. History of a serum monoclonal IgAkappaprotein-elevated serum free kappa light chains, IgA, and M spike10/16/2018  Bone marrow biopsy 12/03/2016-consistent with multiple myeloma  Bone survey 12/06/2016-no destructive or lytic bone lesions identified.  Revlimid 21 days on/7 days off and weekly dexamethasone 12/16/2016 (Decadron discontinued 12/29/2016 secondary to altered mental status)  Cycle 2 single agent Revlimid 01/14/2017 5. COPD with acute and chronic respiratory failure/hypoxemia 6. History of coronary artery disease 7. Ischemic cardiomyopathy 8. ICD in place 9. Prostatic hypertrophy 10.Inappropriately low erythropoietin level 11/28/2016 11. Severe pancytopenia secondary to multiple myeloma and Revlimid 12.  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  He is currently completing cycle 2 of single agent Revlimid (he was unable to tolerate Decadron).  He presents with an upper respiratory infection and pancytopenia.  The pancytopenia is secondary to myeloma, Revlimid, and infection may be contributing.  Recommendations: 1.  Management of RSV positive swab and upper respiratory infection per the internal medicine service 2.  Hold aspirin and Plavix 3.  Continue broad-spectrum intravenous antibiotics and follow-up cultures, antibiotics can be discontinued in a few days if cultures remain negative and he is afebrile 4.  Hold Revlimid 5.  Check IgA level and serum M spike 6.  Transfuse packed red blood cells for symptomatic anemia 7.  Transfuse platelets for a count of less than 10,000 or bleeding  I  will follow him with the medical service while hospitalized.        LOS: 0 days   [name], MD   02/02/2017, 2:23 PM

## 2017-02-02 NOTE — Progress Notes (Signed)
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  Pharmacy has been consulted for Vancomycin and Zosyn dosing.  Plan: Vancomycin 1gm iv x1 (0139), then 1250mg  iv q48hr Goal AUC = 400 - 500 for all indications, except meningitis (goal AUC > 500 and Cmin 15-20 mcg/mL)  Zosyn 3.375gm iv x1, then Zosyn 3.375g IV Q8H infused over 4hrs  Height: 6' (182.9 cm) Weight: 155 lb (70.3 kg) IBW/kg (Calculated) : 77.6  Temp (24hrs), Avg:100.2 F (37.9 C), Min:98.9 F (37.2 C), Max:101.4 F (38.6 C)  Recent Labs  Lab 02/01/17 2012 02/01/17 2021  WBC 1.5*  --   CREATININE 1.48*  --   LATICACIDVEN  --  0.97    Estimated Creatinine Clearance: 33.6 mL/min (A) (by C-G formula based on SCr of 1.48 mg/dL (H)).    No Known Allergies  Antimicrobials this admission: Vancomycin 02/02/2017 >> Zosyn 02/02/2017 >>  Dose adjustments this admission: -  Microbiology results: pending  Thank you for allowing pharmacy to be a part of this patient's care.  Nani Skillern Crowford 02/02/2017 3:20 AM

## 2017-02-02 NOTE — Progress Notes (Signed)
MEDICATION RELATED CONSULT NOTE - INITIAL   Pharmacy Consult for Lenalidomide Indication: Oral Chemo  No Known Allergies  Patient Measurements: Height: 6' (182.9 cm) Weight: 155 lb (70.3 kg) IBW/kg (Calculated) : 77.6 Adjusted Body Weight:   Vital Signs: Temp: 101.4 F (38.6 C) (01/06 1920) Temp Source: Oral (01/06 1920) BP: 112/50 (01/07 0014) Pulse Rate: 65 (01/07 0014) Intake/Output from previous day: 01/06 0701 - 01/07 0700 In: 2400 [IV Piggyback:2400] Out: -  Intake/Output from this shift: Total I/O In: 2400 [IV Piggyback:2400] Out: -   Labs: Recent Labs    02/01/17 2012  WBC 1.5*  HGB 8.3*  HCT 25.3*  PLT 27*  CREATININE 1.48*  ALBUMIN 3.3*  PROT 5.8*  AST 16  ALT 11*  ALKPHOS 47  BILITOT 0.8   Estimated Creatinine Clearance: 33.6 mL/min (A) (by C-G formula based on SCr of 1.48 mg/dL (H)).   Microbiology: No results found for this or any previous visit (from the past 720 hour(s)).  Medical History: Past Medical History:  Diagnosis Date  . Anemia, unspecified   . CAD (coronary artery disease)   . Cataracts, bilateral   . CHF (congestive heart failure) ([place])   . Chronic airway obstruction, not elsewhere classified    on o2 at night  . Diabetes mellitus   . Diverticulosis of colon (without mention of hemorrhage)   . Dyslipidemia   . Emphysema   . Hemorrhoids   . ICD (implantable cardiac defibrillator) in place    st jude  . Ischemic cardiomyopathy   . Macular degeneration   . MI, old 2010 or 2011  . Prostatic hypertrophy    hx of  . Retention of urine, unspecified   . Unspecified disorder resulting from impaired renal function     Medications:   (Not in a hospital admission) Scheduled:  . arformoterol  15 mcg Nebulization BID  . aspirin EC  81 mg Oral QHS  . budesonide  0.5 mg Nebulization BID  . clopidogrel  75 mg Oral Daily  . pantoprazole  40 mg Oral Daily  . rosuvastatin  5 mg Oral q1800  . sodium chloride flush  3 mL  Intravenous Q12H    Assessment: Lenalidomide (Revlimid) hold criteria  ANC < 1  Pltc < 50K  AST or ALT > 3x ULN  Bili > 1.5x ULN  Acute venous thromboembolic event  Active infection   Goal of Therapy:  Safe and effective use of Lenalidomide  Plan:  Hold the medication at this time due to active infection.  Tyler Deis, Shea Stakes Crowford 02/02/2017,12:21 AM

## 2017-02-03 ENCOUNTER — Telehealth: Payer: Self-pay | Admitting: Cardiovascular Disease

## 2017-02-03 DIAGNOSIS — D649 Anemia, unspecified: Secondary | ICD-10-CM

## 2017-02-03 DIAGNOSIS — A419 Sepsis, unspecified organism: Principal | ICD-10-CM

## 2017-02-03 DIAGNOSIS — R6520 Severe sepsis without septic shock: Secondary | ICD-10-CM

## 2017-02-03 DIAGNOSIS — J9611 Chronic respiratory failure with hypoxia: Secondary | ICD-10-CM

## 2017-02-03 DIAGNOSIS — N183 Chronic kidney disease, stage 3 (moderate): Secondary | ICD-10-CM

## 2017-02-03 DIAGNOSIS — I2583 Coronary atherosclerosis due to lipid rich plaque: Secondary | ICD-10-CM

## 2017-02-03 DIAGNOSIS — I5032 Chronic diastolic (congestive) heart failure: Secondary | ICD-10-CM

## 2017-02-03 DIAGNOSIS — N179 Acute kidney failure, unspecified: Secondary | ICD-10-CM

## 2017-02-03 DIAGNOSIS — I2510 Atherosclerotic heart disease of native coronary artery without angina pectoris: Secondary | ICD-10-CM

## 2017-02-03 LAB — CBC WITH DIFFERENTIAL/PLATELET
Basophils Absolute: 0 10*3/uL (ref 0.0–0.1)
Basophils Relative: 1 %
EOS ABS: 0 10*3/uL (ref 0.0–0.7)
Eosinophils Relative: 0 %
HCT: 24.3 % — ABNORMAL LOW (ref 39.0–52.0)
Hemoglobin: 8.2 g/dL — ABNORMAL LOW (ref 13.0–17.0)
LYMPHS ABS: 0.3 10*3/uL — AB (ref 0.7–4.0)
Lymphocytes Relative: 24 %
MCH: 25.4 pg — AB (ref 26.0–34.0)
MCHC: 33.7 g/dL (ref 30.0–36.0)
MCV: 75.2 fL — ABNORMAL LOW (ref 78.0–100.0)
MONO ABS: 0.1 10*3/uL (ref 0.1–1.0)
MONOS PCT: 6 %
NEUTROS ABS: 0.9 10*3/uL — AB (ref 1.7–7.7)
Neutrophils Relative %: 69 %
PLATELETS: 24 10*3/uL — AB (ref 150–400)
RBC: 3.23 MIL/uL — AB (ref 4.22–5.81)
RDW: 17.8 % — AB (ref 11.5–15.5)
WBC: 1.3 10*3/uL — AB (ref 4.0–10.5)

## 2017-02-03 LAB — BASIC METABOLIC PANEL
Anion gap: 8 (ref 5–15)
BUN: 35 mg/dL — AB (ref 6–20)
CALCIUM: 8.1 mg/dL — AB (ref 8.9–10.3)
CO2: 21 mmol/L — ABNORMAL LOW (ref 22–32)
CREATININE: 1.62 mg/dL — AB (ref 0.61–1.24)
Chloride: 107 mmol/L (ref 101–111)
GFR calc non Af Amer: 36 mL/min — ABNORMAL LOW (ref >60)
GFR, EST AFRICAN AMERICAN: 42 mL/min — AB (ref >60)
Glucose, Bld: 159 mg/dL — ABNORMAL HIGH (ref 65–99)
Potassium: 4.2 mmol/L (ref 3.5–5.1)
SODIUM: 136 mmol/L (ref 135–145)

## 2017-02-03 LAB — BPAM RBC
Blood Product Expiration Date: 201901292359
ISSUE DATE / TIME: 201901072041
Unit Type and Rh: 6200

## 2017-02-03 LAB — TYPE AND SCREEN
ABO/RH(D): A POS
Antibody Screen: NEGATIVE
Unit division: 0

## 2017-02-03 LAB — MRSA PCR SCREENING: MRSA BY PCR: NEGATIVE

## 2017-02-03 LAB — GLUCOSE, CAPILLARY: Glucose-Capillary: 134 mg/dL — ABNORMAL HIGH (ref 65–99)

## 2017-02-03 LAB — URINE CULTURE: Culture: <10000 — AB

## 2017-02-03 MED ORDER — ZOLPIDEM TARTRATE 5 MG PO TABS
5.0000 mg | ORAL_TABLET | Freq: Once | ORAL | Status: AC
  Administered 2017-02-03: 5 mg via ORAL
  Filled 2017-02-03: qty 1

## 2017-02-03 MED ORDER — LORATADINE 10 MG PO TABS
10.0000 mg | ORAL_TABLET | Freq: Every day | ORAL | Status: DC
  Administered 2017-02-03 – 2017-02-05 (×3): 10 mg via ORAL
  Filled 2017-02-03 (×3): qty 1

## 2017-02-03 NOTE — Progress Notes (Signed)
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  He continues to have a cough.  His daughters are at the bedside.  Objective: Vital signs in last 24 hours: Blood pressure (!) 110/26, pulse (!) 56, temperature (!) 97.5 F (36.4 C), temperature source Oral, resp. rate 20, height 6' (1.829 m), weight 155 lb 10.3 oz (70.6 kg), SpO2 99 %.  Intake/Output from previous day: 01/07 0701 - 01/08 0700 In: 2030 [P.O.:300; I.V.:1000; Blood:630; IV Piggyback:100] Out: 350 [Urine:350]  Physical Exam:  HEENT: No thrush or bleeding   Extremities: Trace left lower leg edema Skin: Changes of steroid purpura at the forearm bilaterally with ecchymosis over the left anterior chest wall   Lab Results: Recent Labs    02/02/17 0440 02/03/17 0316  WBC 1.1* 1.3*  HGB 7.1* 8.2*  HCT 22.4* 24.3*  PLT 22* 24*  ANC 0.9  BMET Recent Labs    02/01/17 2012 02/03/17 0316  NA 137 136  K 4.4 4.2  CL 103 107  CO2 28 21*  GLUCOSE 102* 159*  BUN 30* 35*  CREATININE 1.48* 1.62*  CALCIUM 8.5* 8.1*    No results found for: CEA1  Studies/Results: Dg Chest 2 View  Result Date: 02/01/2017 CLINICAL DATA:  Cough, congestion and fever EXAM: CHEST  2 VIEW COMPARISON:  12/02/2016 FINDINGS: Emphysematous hyperinflation of the lungs. Tortuous atherosclerotic aorta without aneurysm. Left-sided ICD device with leads in the right atrium and right ventricle. Normal size heart. No pneumonic consolidation, effusion or pneumothorax. Chronic scarring and/or atelectasis along the minor fissure. No overt pulmonary edema. Chronic degenerative changes are seen along the dorsal spine. IMPRESSION: 1. Emphysematous hyperinflation of the lungs. No active pulmonary disease. 2. Aortic atherosclerosis. Electronically Signed   By: Ashley Royalty M.D.   On: 02/01/2017 20:52    Medications: I have reviewed the patient's current medications.  Assessment/Plan: 1. Pancytopenia-transfused with packed red blood cells  11/28/2016, 12/27/2016, 01/13/2017 2. History of beta thalassemia trait 3. History of vitamin B-12 deficiency-vitamin B-12 replacement initiated 11/11/2016 4. History of a serum monoclonal IgAkappaprotein-elevated serum free kappa light chains, IgA, and M spike10/16/2018  Bone marrow biopsy 12/03/2016-consistent with multiple myeloma  Bone survey 12/06/2016-no destructive or lytic bone lesions identified.  Revlimid 21 days on/7 days off and weekly dexamethasone 12/16/2016 (Decadron discontinued 12/29/2016 secondary to altered mental status)  Cycle 2 single agent Revlimid 01/14/2017 5. COPD with acute and chronic respiratory failure/hypoxemia 6. History of coronary artery disease 7. Ischemic cardiomyopathy 8. ICD in place 9. Prostatic hypertrophy 10.Inappropriately low erythropoietin level 11/28/2016 11. Severe pancytopenia secondary to multiple myeloma and Revlimid 12.  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  He is afebrile.  He has stable neutropenia/thrombocytopenia and the hemoglobin has increased after a transfusion yesterday. It is unclear whether the severe pancytopenia is related to Revlimid or multiple myeloma.  The myeloma panel from hospital admission is pending.  I discussed alternate treatment options with his daughters.  They are concerned of the possibility of neuropathy with Velcade.  1.  Management of RSV positive swab and upper respiratory infection per the internal medicine service 2.  Hold aspirin and Plavix secondary to severe thrombocytopenia 3.  Hold Revlimid 5.  Follow-up IgA level, serum M spike, and light chains from 02/01/2017 6.  Transfuse platelets for bleeding or a count of less than 10,000 7.  Outpatient follow-up will be scheduled at the cancer center for within the next  2 weeks        LOS: 1 day   [name], MD   02/03/2017, 1:35 PM

## 2017-02-03 NOTE — Telephone Encounter (Signed)
New Message      Patient daughter wants you to know that the patient has been admitted to the hospital

## 2017-02-03 NOTE — Progress Notes (Addendum)
[facility] PROGRESS NOTE  [name] Azure, MD TIR:[id number] DOB: [birth year] DOA: 02/01/2017 PCP: [name], MD   Interim summary and HPI 84 y.o. male with medical history significant for emphysema with chronic hypoxic respiratory failure, coronary artery disease, chronic diastolic CHF, and multiple myeloma on Revlimid, now presenting to the emergency department for evaluation of fevers, sinus congestion, and dyspnea.  Patient was recently exposed to grandchildren with upper respiratory illness. Found to have RSV infection with COPD exacerbation.   Assessment/Plan: 1-RSV infection with COPD exacerbation: -continue supportive care -continue flutter valve and steroids tapering  -PRN nebulizer and continue use of pulmicort and brovana -weaned oxygen as tolerated  -patient improving   2-Suspected sepsis: sepsis features essentially resolved now  -will continue treatment as mentioned above  -continue supportive care  3-hx of MM -continue care and follow up with Dr. [name]  -will follow rec's -holding Revlimid currently   4-pancytopenia -most likely associated with treatment for MM -continue holding Revlimid  -patient s/p 1 unit of PRBC, Hgb in 8.2 range -platelets up as well and not signs of acute overt bleeding   5-chronic diastolic HF -compensated -continue daily weights and strict I's and O's -heart healthy diet ordered   6-chronic resp failure: with underlying hx of emphysema and vocal cord dysfunction  -using 2L [place] at night time  -will continue weaning oxygen as tolerated   7-acute on chronic renal failure: stage 3 at baseline  -continue gentle hydration  -avoid nephrotoxic agents   8-hx of CAD -continue holding ASA and plavix due to thrombocytopenia -continue Crestor   9-hypertension -Blood pressure continued to be soft (even improving) -Will continue holding antihypertensive regimen and diuretics for now.   Code Status: DNR Family Communication:  Daughter at bedside. Disposition Plan: Remains in the stepdown overnight next continue steroids tapering Short course of doxycycline. To continue pulmonary hygiene.  Follow clinical response.   Consultants:  PCCM  Oncology  Procedures:  See below for x-ray reports  Antibiotics:  Doxycycline  HPI/Subjective: Feeling better overall, Breathing also improving.  Denies chest pain and palpitations.  No nausea or vomiting.  Still with shortness of breath and using 2 L of oxygen supplementation 24 hours a day.  Objective: Vitals:   02/03/17 0900 02/03/17 0930  BP: (!) 110/26   Pulse: (!) 43 (!) 56  Resp: 18 20  Temp:    SpO2: 100% 99%    Intake/Output Summary (Last 24 hours) at 02/03/2017 1055 Last data filed at 02/03/2017 0946 Gross per 24 hour  Intake 1030 ml  Output 550 ml  Net 480 ml   Filed Weights   02/01/17 2039 02/02/17 2231 02/03/17 0500  Weight: 70.3 kg (155 lb) 71.6 kg (157 lb 13.6 oz) 70.6 kg (155 lb 10.3 oz)    Exam:   General: Still short of breath especially with minimal exertion, using 2 L nasal cannula oxygen supplementation and having little difficulty speaking in full sentences.  No chest pain, no nausea, no vomiting, overall feeling better.  Cardiovascular: No JVD, no rubs, no gallops, S1 and S2 appreciated on exam.  Respiratory: Positive expiratory wheezing, scattered rhonchi, no crackles; fair air movement.  Normal respiratory effort.  Abdomen: Soft, nontender, nondistended, positive bowel sounds.  Musculoskeletal: No edema, no cyanosis or clubbing.  Data Reviewed: Basic Metabolic Panel: Recent Labs  Lab 02/01/17 2012 02/03/17 0316  NA 137 136  K 4.4 4.2  CL 103 107  CO2 28 21*  GLUCOSE 102* 159*  BUN 30* 35*  CREATININE  1.48* 1.62*  CALCIUM 8.5* 8.1*   Liver Function Tests: Recent Labs  Lab 02/01/17 2012  AST 16  ALT 11*  ALKPHOS 47  BILITOT 0.8  PROT 5.8*  ALBUMIN 3.3*   CBC: Recent Labs  Lab 02/01/17 2012  02/02/17 0440 02/03/17 0316  WBC 1.5* 1.1* 1.3*  NEUTROABS 1.0* 0.7* 0.9*  HGB 8.3* 7.1* 8.2*  HCT 25.3* 22.4* 24.3*  MCV 74.6* 74.2* 75.2*  PLT 27* 22* 24*   BNP (last 3 results) Recent Labs    10/29/16 0706 11/10/16 1755 11/27/16 1700  BNP 56.8 91.3 124.2*    CBG: Recent Labs  Lab 02/02/17 0756  GLUCAP 152*    Recent Results (from the past 240 hour(s))  Urine culture     Status: Abnormal   Collection Time: 02/01/17  8:42 PM  Result Value Ref Range Status   Specimen Description URINE, CATHETERIZED  Final   Special Requests NONE  Final   Culture (A)  Final    <10,000 COLONIES/mL INSIGNIFICANT GROWTH Performed at [facility] [address] [place], [place] [address]    Report Status 02/03/2017 FINAL  Final  Respiratory Panel by PCR     Status: Abnormal   Collection Time: 02/02/17  4:43 AM  Result Value Ref Range Status   Adenovirus NOT DETECTED NOT DETECTED Final   Coronavirus 229E NOT DETECTED NOT DETECTED Final   Coronavirus HKU1 NOT DETECTED NOT DETECTED Final   Coronavirus NL63 NOT DETECTED NOT DETECTED Final   Coronavirus OC43 NOT DETECTED NOT DETECTED Final   Metapneumovirus NOT DETECTED NOT DETECTED Final   Rhinovirus / Enterovirus NOT DETECTED NOT DETECTED Final   Influenza A NOT DETECTED NOT DETECTED Final   Influenza B NOT DETECTED NOT DETECTED Final   Parainfluenza Virus 1 NOT DETECTED NOT DETECTED Final   Parainfluenza Virus 2 NOT DETECTED NOT DETECTED Final   Parainfluenza Virus 3 NOT DETECTED NOT DETECTED Final   Parainfluenza Virus 4 NOT DETECTED NOT DETECTED Final   Respiratory Syncytial Virus DETECTED (A) NOT DETECTED Final    Comment: CRITICAL RESULT CALLED TO, READ BACK BY AND VERIFIED WITH: A. Lassiter RN 13:45 02/02/17 ([email or web address])    Bordetella pertussis NOT DETECTED NOT DETECTED Final   Chlamydophila pneumoniae NOT DETECTED NOT DETECTED Final   Mycoplasma pneumoniae NOT DETECTED NOT DETECTED Final  Culture, sputum-assessment      Status: None   Collection Time: 02/02/17  2:54 PM  Result Value Ref Range Status   Specimen Description SPUTUM  Final   Special Requests Immunocompromised  Final   Sputum evaluation THIS SPECIMEN IS ACCEPTABLE FOR SPUTUM CULTURE  Final   Report Status 02/02/2017 FINAL  Final  MRSA PCR Screening     Status: None   Collection Time: 02/02/17 10:30 PM  Result Value Ref Range Status   MRSA by PCR NEGATIVE NEGATIVE Final    Comment:        The GeneXpert MRSA Assay (FDA approved for NASAL specimens only), is one component of a comprehensive MRSA colonization surveillance program. It is not intended to diagnose MRSA infection nor to guide or monitor treatment for MRSA infections.      Studies: Dg Chest 2 View  Result Date: 02/01/2017 CLINICAL DATA:  Cough, congestion and fever EXAM: CHEST  2 VIEW COMPARISON:  12/02/2016 FINDINGS: Emphysematous hyperinflation of the lungs. Tortuous atherosclerotic aorta without aneurysm. Left-sided ICD device with leads in the right atrium and right ventricle. Normal size heart. No pneumonic consolidation, effusion or pneumothorax. Chronic  scarring and/or atelectasis along the minor fissure. No overt pulmonary edema. Chronic degenerative changes are seen along the dorsal spine. IMPRESSION: 1. Emphysematous hyperinflation of the lungs. No active pulmonary disease. 2. Aortic atherosclerosis. Electronically Signed   By: Ashley Royalty M.D.   On: 02/01/2017 20:52    Scheduled Meds: . arformoterol  15 mcg Nebulization BID  . budesonide  0.5 mg Nebulization BID  . doxycycline  100 mg Oral Q12H  . fluticasone  1 spray Each Nare Daily  . loratadine  10 mg Oral Daily  . methylPREDNISolone (SOLU-MEDROL) injection  40 mg Intravenous Q8H  . pantoprazole  40 mg Oral Daily  . rosuvastatin  5 mg Oral q1800  . sodium chloride flush  3 mL Intravenous Q12H     Time spent: 35 minutes    [facility] Pager [phone]. If 7PM-7AM, please contact  night-coverage at [email or web address], password [facility] 02/03/2017, 10:55 AM  LOS: 1 day

## 2017-02-03 NOTE — Care Management Note (Signed)
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 distress Action/Plan: Date: February 03, 2017 Velva Harman, BSN, [place], [place] Chart and notes review for patient progress and needs. Will follow for case management and discharge needs. Next review date: [id number]  Expected Discharge Date:  (unknown)               Expected Discharge Plan:  Home/Self Care  In-House Referral:     Discharge planning Services     Post Acute Care Choice:    Choice offered to:  Patient  DME Arranged:    DME Agency:     HH Arranged:  PCS/Personal Care Services [facility] Agency:  ([place])  Status of Service:  In process, will continue to follow  If discussed at Long Length of Stay Meetings, dates discussed:    Additional Comments:  Leeroy Cha, RN 02/03/2017, 8:15 AM

## 2017-02-03 NOTE — Consult Note (Signed)
Name: Londyn Wotton, MD MRN: [id number] DOB: September 04, 1925    ADMISSION DATE:  02/01/2017 CONSULTATION DATE:  1/8  REFERRING MD :  Dyann Kief   CHIEF COMPLAINT:  aecopd   BRIEF PATIENT DESCRIPTION:  This 84 year old male patient followed by Dr. Elsworth Soho for COPD.  Admitted 1/7 with RSV infection and associated acute exacerbation chronic obstructive pulmonary disease.  SIGNIFICANT EVENTS    STUDIES:     HISTORY OF PRESENT ILLNESS:   84 year old white male patient followed by Dr. Elsworth Soho for chronic obstructive pulmonary disease FEV1 60% predicted.  With multiple myeloma for which he is being treated by hematology with Revlimid.  Presented to the emergency room on 1/7 with fever, cough, nasal congestion and shortness of breath.  Recently exposed to sick grandchild.  Had been at baseline until 3 days prior.  The emergency room found to be febrile, had thrombocytopenia.  He was admitted with working diagnosis of acute exacerbation of chronic obstructive pulmonary disease and treated with broad-spectrum antibiotics, scheduled broncho-dilators, and systemic steroids.  Respiratory viral panel positive for respiratory syncytial virus.  Pulmonary asked to evaluate current plan of care  PAST MEDICAL HISTORY :   has a past medical history of Anemia, unspecified, CAD (coronary artery disease), Cataracts, bilateral, CHF (congestive heart failure) ([place]), Chronic airway obstruction, not elsewhere classified, Diabetes mellitus, Diverticulosis of colon (without mention of hemorrhage), Dyslipidemia, Emphysema, Hemorrhoids, ICD (implantable cardiac defibrillator) in place, Ischemic cardiomyopathy, Macular degeneration, MI, old (2010 or 2011), Prostatic hypertrophy, Retention of urine, unspecified, and Unspecified disorder resulting from impaired renal function.  has a past surgical history that includes pci; Cardiac defibrillator placement (2011); Pacemaker insertion (2011); Tonsillectomy (84 yo); and Cataract  extraction. Prior to Admission medications   Medication Sig Start Date End Date Taking? Authorizing Provider  albuterol (PROVENTIL) (2.5 MG/3ML) 0.083% nebulizer solution Take 3 mLs (2.5 mg total) every 2 (two) hours as needed by nebulization for wheezing or shortness of breath. 12/05/16  Yes Ghimire, Henreitta Leber, MD  ALPRAZolam Duanne Moron) 0.25 MG tablet Take 1 tablet (0.25 mg total) at bedtime as needed by mouth for sleep. 12/05/16  Yes Ghimire, Henreitta Leber, MD  arformoterol (BROVANA) 15 MCG/2ML NEBU Take 15 mcg by nebulization 2 (two) times daily.   Yes [provider]  aspirin EC 81 MG tablet Take 1 tablet (81 mg total) by mouth at bedtime. 11/27/13  Yes Dhungel, Nishant, MD  bisacodyl (DULCOLAX) 5 MG EC tablet Take 5 mg by mouth daily as needed for moderate constipation.   Yes [provider]  budesonide (PULMICORT) 0.5 MG/2ML nebulizer solution Take 2 mLs (0.5 mg total) by nebulization 2 (two) times daily. 08/31/12  Yes Parrett, Tammy S, NP  clopidogrel (PLAVIX) 75 MG tablet Take 75 mg by mouth daily.   Yes [provider]  ferrous sulfate 325 (65 FE) MG tablet Take 1 tablet (325 mg total) by mouth 2 (two) times daily with a meal. 11/14/16  Yes Ollis, Brandi L, NP  fluticasone (FLONASE) 50 MCG/ACT nasal spray Place 1 spray into both nostrils daily. Patient taking differently: Place 1 spray into both nostrils daily as needed for allergies.  11/15/16  Yes Ollis, Cleaster Corin, NP  guaiFENesin (MUCINEX) 600 MG 12 hr tablet Take 600 mg by mouth 2 (two) times daily as needed for cough or to loosen phlegm.    Yes [provider]  lenalidomide (REVLIMID) 10 MG capsule Take 1 capsule (10 mg total) by mouth daily. Take with water for 21 days on, 7 days  off, repeat every 28 days. 01/14/17  Yes Ladell Pier, MD  metoprolol tartrate (LOPRESSOR) 25 MG tablet Take 12.5 mg by mouth 2 (two) times daily. Reported on 06/06/2015   Yes [provider]  nitroGLYCERIN (NITROSTAT) 0.4 MG  SL tablet Place 1 tablet (0.4 mg total) under the tongue every 5 (five) minutes as needed for chest pain. UP TO 3 DOSES THEN CALL EMS 11/02/16  Yes Domenic Polite, MD  OXYGEN Inhale 2 L into the lungs at bedtime.    Yes [provider]  pantoprazole (PROTONIX) 40 MG tablet Take 1 tablet (40 mg total) by mouth daily. 11/15/16  Yes Ollis, Brandi L, NP  rosuvastatin (CRESTOR) 5 MG tablet Take 1 tablet (5 mg total) by mouth daily at 6 PM. Patient taking differently: Take 5 mg by mouth at bedtime.  08/12/16  Yes Josue Hector, MD  sodium chloride (OCEAN) 0.65 % SOLN nasal spray Place 2 sprays into both nostrils 2 (two) times daily. 11/14/16  Yes Donita Brooks, NP  trimethoprim (TRIMPEX) 100 MG tablet Take 1 tablet (100 mg total) daily by mouth. 12/08/16  Yes Thurnell Lose, MD  albuterol (PROAIR HFA) 108 (90 Base) MCG/ACT inhaler Inhale 2 puffs into the lungs every 4 (four) hours as needed for wheezing or shortness of breath. Patient not taking: Reported on 02/01/2017 12/26/16   Parrett, Fonnie Mu, NP  vitamin B-12 1000 MCG tablet Take 1 tablet (1,000 mcg total) daily by mouth. Patient not taking: Reported on 02/01/2017 12/05/16   Jonetta Osgood, MD  vitamin C (VITAMIN C) 250 MG tablet Take 1 tablet (250 mg total) by mouth 2 (two) times daily. Patient not taking: Reported on 02/01/2017 11/14/16   Donita Brooks, NP   No Known Allergies  FAMILY HISTORY:  family history includes Atrial fibrillation in his daughter; Breast cancer in his sister; Congestive Heart Failure in his mother; Coronary artery disease in his mother; Heart attack in his brother; Lung cancer in his father; Stroke in his mother. SOCIAL HISTORY:  reports that he quit smoking about 7 years ago. His smoking use included cigarettes. he has never used smokeless tobacco. He reports that he drinks alcohol. He reports that he does not use drugs.  REVIEW OF SYSTEMS:   Constitutional: + fever, -chills, -weight loss, malaise/fatigue  and diaphoresis.  HENT: Negative for hearing loss, ear pain, nosebleeds, congestion, sore throat, neck pain, tinnitus and ear discharge.   Eyes: Negative for blurred vision, double vision, photophobia, pain, discharge and redness.  Respiratory: +cough, - hemoptysis, + sputum production, +shortness of breath, wheezing and stridor.   Cardiovascular: Negative for chest pain, palpitations, orthopnea, claudication, leg swelling and PND.  Gastrointestinal: Negative for heartburn, nausea, vomiting, abdominal pain, diarrhea, constipation, blood in stool and melena.  Genitourinary: Negative for dysuria, urgency, frequency, hematuria and flank pain.  Musculoskeletal: Negative for myalgias, back pain, joint pain and falls.  Skin: Negative for itching and rash.  Neurological: Negative for dizziness, tingling, tremors, sensory change, speech change, focal weakness, seizures, loss of consciousness, weakness and headaches.  Endo/Heme/Allergies: Negative for environmental allergies and polydipsia. Does not bruise/bleed easily.  SUBJECTIVE:  Feels better VITAL SIGNS: Temp:  [97.7 F (36.5 C)-98.6 F (37 C)] 98 F (36.7 C) (01/08 0800) Pulse Rate:  [43-79] 56 (01/08 0930) Resp:  [14-26] 20 (01/08 0930) BP: (85-123)/(26-54) 110/26 (01/08 0900) SpO2:  [96 %-100 %] 99 % (01/08 0930) Weight:  [155 lb 10.3 oz (70.6 kg)-157 lb 13.6 oz (71.6 kg)]  155 lb 10.3 oz (70.6 kg) (01/08 0500)  PHYSICAL EXAMINATION: General: 84 year old white male resting comfortably in bed doing crossword puzzle Neuro: Awake alert no focal deficits HEENT: Normocephalic atraumatic no jugular venous distention Cardiovascular: Regular rate and rhythm without murmur rub or gallop Lungs: Diffuse rhonchi and wheeze no accessory use Abdomen: Soft nontender no organomegaly positive bowel sounds tolerating diet Musculoskeletal: Equal strength bulk Skin: Warm dry and intact  Recent Labs  Lab 02/01/17 2012 02/03/17 0316  NA 137 136  K  4.4 4.2  CL 103 107  CO2 28 21*  BUN 30* 35*  CREATININE 1.48* 1.62*  GLUCOSE 102* 159*   Recent Labs  Lab 02/01/17 2012 02/02/17 0440 02/03/17 0316  HGB 8.3* 7.1* 8.2*  HCT 25.3* 22.4* 24.3*  WBC 1.5* 1.1* 1.3*  PLT 27* 22* 24*   Dg Chest 2 View  Result Date: 02/01/2017 CLINICAL DATA:  Cough, congestion and fever EXAM: CHEST  2 VIEW COMPARISON:  12/02/2016 FINDINGS: Emphysematous hyperinflation of the lungs. Tortuous atherosclerotic aorta without aneurysm. Left-sided ICD device with leads in the right atrium and right ventricle. Normal size heart. No pneumonic consolidation, effusion or pneumothorax. Chronic scarring and/or atelectasis along the minor fissure. No overt pulmonary edema. Chronic degenerative changes are seen along the dorsal spine. IMPRESSION: 1. Emphysematous hyperinflation of the lungs. No active pulmonary disease. 2. Aortic atherosclerosis. Electronically Signed   By: Ashley Royalty M.D.   On: 02/01/2017 20:52    ASSESSMENT / PLAN: COPD FEV1 60% Chronic hypoxic respiratory failure Pancytopenia in the setting of multiple myeloma Chronic diastolic heart failure Coronary artery disease Chronic kidney disease stage II Mild hyperglycemia  Acute exacerbation chronic obstructive pulmonary disease setting of RSV infection Seemingly better with current therapy Plan/rec Agree with doxycycline, reasonable to treat for 5-day course Agree with systemic steroids with 10-14-day taper Continue scheduled bronchodilators Wean oxygen Pulmonary hygiene interventions We will arrange for pulmonary follow-up in our clinic for late next week Hold Revlimid   Chronic diastolic heart failure Plan Hold antihypertensives  History of coronary artery disease Plan Continue telemetry monitoring  AK I superimposed on chronic stage II kidney disease Plan Continue gentle IV fluids Renal dose medications Follow-up a.m. Chemistry  We will be available as needed  Erick Colace  ACNP-BC [place] Pager # [phone] OR # [phone] if no answer  02/03/2017, 11:42 AM

## 2017-02-04 ENCOUNTER — Telehealth: Payer: Self-pay

## 2017-02-04 DIAGNOSIS — R69 Illness, unspecified: Secondary | ICD-10-CM

## 2017-02-04 LAB — KAPPA/LAMBDA LIGHT CHAINS
KAPPA FREE LGHT CHN: 134.2 mg/L — AB (ref 3.3–19.4)
KAPPA, LAMDA LIGHT CHAIN RATIO: 9.52 — AB (ref 0.26–1.65)
Lambda free light chains: 14.1 mg/L (ref 5.7–26.3)

## 2017-02-04 LAB — CBC
HCT: 24.8 % — ABNORMAL LOW (ref 39.0–52.0)
Hemoglobin: 8.2 g/dL — ABNORMAL LOW (ref 13.0–17.0)
MCH: 24.9 pg — ABNORMAL LOW (ref 26.0–34.0)
MCHC: 33.1 g/dL (ref 30.0–36.0)
MCV: 75.4 fL — AB (ref 78.0–100.0)
Platelets: 24 10*3/uL — CL (ref 150–400)
RBC: 3.29 MIL/uL — AB (ref 4.22–5.81)
RDW: 18.2 % — AB (ref 11.5–15.5)
WBC: 1.1 10*3/uL — CL (ref 4.0–10.5)

## 2017-02-04 LAB — PROTEIN ELECTROPHORESIS, SERUM
A/G Ratio: 1.4 (ref 0.7–1.7)
ALPHA-1-GLOBULIN: 0.3 g/dL (ref 0.0–0.4)
Albumin ELP: 3 g/dL (ref 2.9–4.4)
Alpha-2-Globulin: 0.7 g/dL (ref 0.4–1.0)
Beta Globulin: 0.9 g/dL (ref 0.7–1.3)
GAMMA GLOBULIN: 0.3 g/dL — AB (ref 0.4–1.8)
GLOBULIN, TOTAL: 2.1 g/dL — AB (ref 2.2–3.9)
M-SPIKE, %: 0.2 g/dL — AB
TOTAL PROTEIN ELP: 5.1 g/dL — AB (ref 6.0–8.5)

## 2017-02-04 LAB — BASIC METABOLIC PANEL
ANION GAP: 5 (ref 5–15)
BUN: 41 mg/dL — ABNORMAL HIGH (ref 6–20)
CHLORIDE: 106 mmol/L (ref 101–111)
CO2: 26 mmol/L (ref 22–32)
Calcium: 8.5 mg/dL — ABNORMAL LOW (ref 8.9–10.3)
Creatinine, Ser: 1.39 mg/dL — ABNORMAL HIGH (ref 0.61–1.24)
GFR calc non Af Amer: 43 mL/min — ABNORMAL LOW (ref >60)
GFR, EST AFRICAN AMERICAN: 50 mL/min — AB (ref >60)
Glucose, Bld: 133 mg/dL — ABNORMAL HIGH (ref 65–99)
POTASSIUM: 4.7 mmol/L (ref 3.5–5.1)
SODIUM: 137 mmol/L (ref 135–145)

## 2017-02-04 LAB — GLUCOSE, CAPILLARY: GLUCOSE-CAPILLARY: 104 mg/dL — AB (ref 65–99)

## 2017-02-04 LAB — IGG, IGA, IGM
IGM (IMMUNOGLOBULIN M), SRM: 11 mg/dL — AB (ref 15–143)
IgA: 295 mg/dL (ref 61–437)
IgG (Immunoglobin G), Serum: 280 mg/dL — ABNORMAL LOW (ref 700–1600)

## 2017-02-04 MED ORDER — METHYLPREDNISOLONE SODIUM SUCC 40 MG IJ SOLR
40.0000 mg | Freq: Two times a day (BID) | INTRAMUSCULAR | Status: DC
  Administered 2017-02-04 – 2017-02-05 (×2): 40 mg via INTRAVENOUS
  Filled 2017-02-04 (×2): qty 1

## 2017-02-04 NOTE — Telephone Encounter (Signed)
Returned call to pt daughter and informed that appointment tomorrow will be able to be rescheduled for next week. Daughter voiced understanding.

## 2017-02-04 NOTE — Progress Notes (Signed)
[facility] PROGRESS NOTE  [name] Azure, MD JFH:[id number] DOB: 09/24/1925 DOA: 02/01/2017 PCP: [name], MD   Interim summary and HPI 84 y.o. male with medical history significant for emphysema with chronic hypoxic respiratory failure, coronary artery disease, chronic diastolic CHF, and multiple myeloma on Revlimid, now presenting to the emergency department for evaluation of fevers, sinus congestion, and dyspnea.  Patient was recently exposed to grandchildren with upper respiratory illness. Found to have RSV infection with COPD exacerbation.   Assessment/Plan: 1-RSV infection with COPD exacerbation: -continue supportive care and use of doxycycline for bronchiectasis  -continue flutter valve and steroids tapering  -continue PRN nebulizer and continue using pulmicort and brovana -continue to weaned oxygen as tolerated  -patient continue improving   2-Suspected sepsis:  -sepsis features essentially resolved now  -will continue treatment as mentioned above  -continue supportive care  3-hx of MM -continue care and follow up with Dr. [name]  -will follow rec's -holding Revlimid currently   4-pancytopenia -most likely associated with treatment for MM -continue holding Revlimid  -patient s/p 1 unit of PRBC on 1/7, Hgb in 8.2 range stable for the last 2 days now. -platelets count stable and not signs of acute overt bleeding   5-chronic diastolic HF -compensated and stable. -continue daily weights and strict I's and O's -heart healthy diet ordered   6-chronic resp failure: with underlying hx of emphysema and vocal cord dysfunction  -using 2L [place] [facility] [place] at night time only -will weaned oxygen as tolerated    7-acute on chronic renal failure: stage 3 at baseline  -continue gentle hydration, for today -Cr down to 1.3 now -continue avoiding nephrotoxic agents   8-hx of CAD -continue holding ASA and plavix due to thrombocytopenia -continue Crestor   9-hypertension -Blood  pressure improved and rising -will monitor and resume home regimen when appropriate   Code Status: DNR Family Communication: Daughter at bedside. Disposition Plan: will transfer to med-surg bed; start steroids tapering and follow clinical response. Assessment by Pt and OT.   Consultants:  PCCM  Oncology  Procedures:  See below for x-ray reports  Antibiotics:  Doxycycline  HPI/Subjective: No CP, no nausea, no vomiting and no abd pain. Patient feeling a lot better today and reporting improvement in his breathing.   Objective: Vitals:   02/04/17 0600 02/04/17 0755  BP: (!) 106/39   Pulse: 60   Resp: (!) 30   Temp:  98.6 F (37 C)  SpO2: 98%     Intake/Output Summary (Last 24 hours) at 02/04/2017 1022 Last data filed at 02/04/2017 0813 Gross per 24 hour  Intake 820 ml  Output 1220 ml  Net -400 ml   Filed Weights   02/01/17 2039 02/02/17 2231 02/03/17 0500  Weight: 70.3 kg (155 lb) 71.6 kg (157 lb 13.6 oz) 70.6 kg (155 lb 10.3 oz)    Exam:   General: still with some SOB, but much improved overall. Using 2L of [place] supplementation. Denies CP, nausea, abd pain, vomiting or any other complaints.    Cardiovascular: No JVD appreciated, no rubs, no gallops, S1 and S2 appreciated on exam.   Respiratory: very little wheezing today (but exam done after nebulizer treatment); improved air movement and just scattered rhonchi on exam.   Abdomen: soft, NT, ND, positive BS.  Musculoskeletal: no edema, no cyanosis, no clubbing  Data Reviewed: Basic Metabolic Panel: Recent Labs  Lab 02/01/17 2012 02/03/17 0316 02/04/17 0249  NA 137 136 137  K 4.4 4.2 4.7  CL 103 107 106  CO2 28 21* 26  GLUCOSE 102* 159* 133*  BUN 30* 35* 41*  CREATININE 1.48* 1.62* 1.39*  CALCIUM 8.5* 8.1* 8.5*   Liver Function Tests: Recent Labs  Lab 02/01/17 2012  AST 16  ALT 11*  ALKPHOS 47  BILITOT 0.8  PROT 5.8*  ALBUMIN 3.3*   CBC: Recent Labs  Lab 02/01/17 2012 02/02/17 0440  02/03/17 0316 02/04/17 0249  WBC 1.5* 1.1* 1.3* 1.1*  NEUTROABS 1.0* 0.7* 0.9*  --   HGB 8.3* 7.1* 8.2* 8.2*  HCT 25.3* 22.4* 24.3* 24.8*  MCV 74.6* 74.2* 75.2* 75.4*  PLT 27* 22* 24* 24*   BNP (last 3 results) Recent Labs    10/29/16 0706 11/10/16 1755 11/27/16 1700  BNP 56.8 91.3 124.2*    CBG: Recent Labs  Lab 02/02/17 0756 02/03/17 0800 02/04/17 0756  GLUCAP 152* 134* 104*    Recent Results (from the past 240 hour(s))  Culture, blood (Routine x 2)     Status: None (Preliminary result)   Collection Time: 02/01/17  8:17 PM  Result Value Ref Range Status   Specimen Description BLOOD LEFT ANTECUBITAL  Final   Special Requests   Final    BOTTLES DRAWN AEROBIC AND ANAEROBIC Blood Culture adequate volume   Culture   Final    NO GROWTH 1 DAY Performed at [facility], [place] [address] [place], [place] [address]    Report Status PENDING  Incomplete  Culture, blood (Routine x 2)     Status: None (Preliminary result)   Collection Time: 02/01/17  8:37 PM  Result Value Ref Range Status   Specimen Description BLOOD RIGHT FOREARM  Final   Special Requests IN PEDIATRIC BOTTLE Blood Culture adequate volume  Final   Culture   Final    NO GROWTH 1 DAY Performed at [facility], [place] [address], [place], [place] [address]    Report Status PENDING  Incomplete  Urine culture     Status: Abnormal   Collection Time: 02/01/17  8:42 PM  Result Value Ref Range Status   Specimen Description URINE, CATHETERIZED  Final   Special Requests NONE  Final   Culture (A)  Final    <10,000 COLONIES/mL INSIGNIFICANT GROWTH Performed at [facility], [place] [address], [place], [place] [address]    Report Status 02/03/2017 FINAL  Final  Respiratory Panel by PCR     Status: Abnormal   Collection Time: 02/02/17  4:43 AM  Result Value Ref Range Status   Adenovirus NOT DETECTED NOT DETECTED Final   Coronavirus 229E NOT DETECTED NOT DETECTED Final   Coronavirus HKU1 NOT DETECTED  NOT DETECTED Final   Coronavirus NL63 NOT DETECTED NOT DETECTED Final   Coronavirus OC43 NOT DETECTED NOT DETECTED Final   Metapneumovirus NOT DETECTED NOT DETECTED Final   Rhinovirus / Enterovirus NOT DETECTED NOT DETECTED Final   Influenza A NOT DETECTED NOT DETECTED Final   Influenza B NOT DETECTED NOT DETECTED Final   Parainfluenza Virus 1 NOT DETECTED NOT DETECTED Final   Parainfluenza Virus 2 NOT DETECTED NOT DETECTED Final   Parainfluenza Virus 3 NOT DETECTED NOT DETECTED Final   Parainfluenza Virus 4 NOT DETECTED NOT DETECTED Final   Respiratory Syncytial Virus DETECTED (A) NOT DETECTED Final    Comment: CRITICAL RESULT CALLED TO, READ BACK BY AND VERIFIED WITH: AEarlie Counts RN 13:45 02/02/17 ([email or web address])    Bordetella pertussis NOT DETECTED NOT DETECTED Final   Chlamydophila pneumoniae NOT DETECTED NOT DETECTED Final  Mycoplasma pneumoniae NOT DETECTED NOT DETECTED Final  Culture, sputum-assessment     Status: None   Collection Time: 02/02/17  2:54 PM  Result Value Ref Range Status   Specimen Description SPUTUM  Final   Special Requests Immunocompromised  Final   Sputum evaluation THIS SPECIMEN IS ACCEPTABLE FOR SPUTUM CULTURE  Final   Report Status 02/02/2017 FINAL  Final  Culture, respiratory (NON-Expectorated)     Status: None (Preliminary result)   Collection Time: 02/02/17  2:54 PM  Result Value Ref Range Status   Specimen Description SPUTUM  Final   Special Requests Immunocompromised Reflexed from [id number]  Final   Gram Stain   Final    RARE WBC PRESENT, PREDOMINANTLY PMN RARE YEAST RARE GRAM NEGATIVE DIPLOCOCCI    Culture   Final    CULTURE REINCUBATED FOR BETTER GROWTH Performed at [facility], [place] [address], [place], [place] [address]    Report Status PENDING  Incomplete  MRSA PCR Screening     Status: None   Collection Time: 02/02/17 10:30 PM  Result Value Ref Range Status   MRSA by PCR NEGATIVE NEGATIVE Final    Comment:        The GeneXpert MRSA  Assay (FDA approved for NASAL specimens only), is one component of a comprehensive MRSA colonization surveillance program. It is not intended to diagnose MRSA infection nor to guide or monitor treatment for MRSA infections.      Studies: No results found.  Scheduled Meds: . arformoterol  15 mcg Nebulization BID  . budesonide  0.5 mg Nebulization BID  . doxycycline  100 mg Oral Q12H  . fluticasone  1 spray Each Nare Daily  . loratadine  10 mg Oral Daily  . methylPREDNISolone (SOLU-MEDROL) injection  40 mg Intravenous Q12H  . pantoprazole  40 mg Oral Daily  . rosuvastatin  5 mg Oral q1800  . sodium chloride flush  3 mL Intravenous Q12H     Time spent: 30 minutes    [facility] Pager [phone]. If 7PM-7AM, please contact night-coverage at [email or web address], password [facility] 02/04/2017, 10:22 AM  LOS: 2 days

## 2017-02-04 NOTE — Progress Notes (Signed)
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  No bleeding.  Objective: Vital signs in last 24 hours: Blood pressure 103/65, pulse 63, temperature 97.8 F (36.6 C), temperature source Oral, resp. rate 18, height 6' (1.829 m), weight 155 lb 10.3 oz (70.6 kg), SpO2 100 %.  Intake/Output from previous day: 01/08 0701 - 01/09 0700 In: 1180 [P.O.:1180] Out: 1120 [Urine:1120]  Physical Exam:  HEENT: No thrush or bleeding   Skin: Changes of steroid purpura at the forearm bilaterally with resolving ecchymosis over the left anterior chest wall   Lab Results: Recent Labs    02/03/17 0316 02/04/17 0249  WBC 1.3* 1.1*  HGB 8.2* 8.2*  HCT 24.3* 24.8*  PLT 24* 24*  ANC 0.9 02/03/2017  BMET Recent Labs    02/03/17 0316 02/04/17 0249  NA 136 137  K 4.2 4.7  CL 107 106  CO2 21* 26  GLUCOSE 159* 133*  BUN 35* 41*  CREATININE 1.62* 1.39*  CALCIUM 8.1* 8.5*   IgA-295, free kappa light chains 134 No results found for: CEA1  Studies/Results: No results found.  Medications: I have reviewed the patient's current medications.  Assessment/Plan: 1. Pancytopenia-transfused with packed red blood cells 11/28/2016, 12/27/2016, 01/13/2017, 02/02/2017 2. History of beta thalassemia trait 3. History of vitamin B-12 deficiency-vitamin B-12 replacement initiated 11/11/2016 4. History of a serum monoclonal IgAkappaprotein-elevated serum free kappa light chains, IgA, and M spike10/16/2018  Bone marrow biopsy 12/03/2016-consistent with multiple myeloma  Bone survey 12/06/2016-no destructive or lytic bone lesions identified.  Revlimid 21 days on/7 days off and weekly dexamethasone 12/16/2016 (Decadron discontinued 12/29/2016 secondary to altered mental status)  Cycle 2 single agent Revlimid 01/14/2017 5. COPD with acute and chronic respiratory failure/hypoxemia 6. History of coronary artery disease 7. Ischemic cardiomyopathy 8. ICD in place 9. Prostatic  hypertrophy 10.Inappropriately low erythropoietin level 11/28/2016 11. Severe pancytopenia secondary to multiple myeloma and Revlimid 12.  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  He remains afebrile and cultures from admission are negative.  He is stable from a hematologic standpoint.  The markers of myeloma are partially improved.  1.  Continue management of COPD and upper respiratory infection per internal medicine 2.  Hold aspirin and Plavix secondary to severe thrombocytopenia 3.  Hold Revlimid 5.  Outpatient follow-up will be scheduled at the Cancer center for the week of 02/09/2017.  Please call oncology as needed.        LOS: 2 days   [name], MD

## 2017-02-05 ENCOUNTER — Encounter: Payer: Medicare Other | Admitting: *Deleted

## 2017-02-05 ENCOUNTER — Other Ambulatory Visit: Payer: Medicare Other

## 2017-02-05 ENCOUNTER — Telehealth: Payer: Self-pay | Admitting: Oncology

## 2017-02-05 ENCOUNTER — Ambulatory Visit: Payer: Medicare Other | Admitting: Nurse Practitioner

## 2017-02-05 DIAGNOSIS — J111 Influenza due to unidentified influenza virus with other respiratory manifestations: Secondary | ICD-10-CM

## 2017-02-05 DIAGNOSIS — R69 Illness, unspecified: Secondary | ICD-10-CM

## 2017-02-05 LAB — CULTURE, RESPIRATORY W GRAM STAIN

## 2017-02-05 LAB — CULTURE, RESPIRATORY: CULTURE: NORMAL

## 2017-02-05 LAB — CREATININE, SERUM
CREATININE: 1.33 mg/dL — AB (ref 0.61–1.24)
GFR calc Af Amer: 53 mL/min — ABNORMAL LOW (ref >60)
GFR calc non Af Amer: 46 mL/min — ABNORMAL LOW (ref >60)

## 2017-02-05 LAB — GLUCOSE, CAPILLARY
GLUCOSE-CAPILLARY: 150 mg/dL — AB (ref 65–99)
Glucose-Capillary: 132 mg/dL — ABNORMAL HIGH (ref 65–99)

## 2017-02-05 MED ORDER — PREDNISONE 20 MG PO TABS: ORAL_TABLET | ORAL | 0 refills | Status: DC

## 2017-02-05 MED ORDER — LORATADINE 10 MG PO TABS: 10.0000 mg | ORAL_TABLET | Freq: Every day | ORAL | 1 refills | Status: DC

## 2017-02-05 MED ORDER — LENALIDOMIDE 10 MG PO CAPS: 10.0000 mg | ORAL_CAPSULE | Freq: Every day | ORAL | 0 refills | Status: DC

## 2017-02-05 MED ORDER — DOXYCYCLINE HYCLATE 100 MG PO TABS: 100.0000 mg | ORAL_TABLET | Freq: Two times a day (BID) | ORAL | 0 refills | Status: AC

## 2017-02-05 NOTE — Progress Notes (Signed)
O2 sat 96% spot check after getting up to [facility]

## 2017-02-05 NOTE — Progress Notes (Signed)
Noted at shift change that pt still had telemetry order.  Patient has not been on tele since transferring to [facility].  texted dr. Dyann Kief for dc tele order.

## 2017-02-05 NOTE — Progress Notes (Signed)
PT demonstrated hands on understanding of Flutter device. 

## 2017-02-05 NOTE — Care Management Important Message (Addendum)
Important Message  Patient Details IM Letter given to Nora/Case Manager to present to the Patient Name: [name] Housand, MD MRN: [id number] Date of Birth: Nov 08, 1925   Medicare Important Message Given:  Yes    Kerin Salen 02/05/2017, 11:10 AMImportant Message  Patient Details  Name: [name] Shawn, MD MRN: [id number] Date of Birth: 02-26-1925   Medicare Important Message Given:  Yes    Kerin Salen 02/05/2017, 11:10 AM

## 2017-02-05 NOTE — Consult Note (Addendum)
   [facility] CM Inpatient Consult   02/05/2017  [name] Azure, MD Jul 10, 1925 [id number]   Screened for potential [facility] Care Management program due to multiple hospitalizations.  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 He states he is feeling better and is looking forward to discharging home today. Family not present at the time. Left [facility] Care Management brochure with contact information for family review.   Noted [name] Primary Care Provider ( Dr. Lysle Rubens with [facility]) office is listed as providing post transition calls.   Made inpatient RNCM aware of above notes.   Marthenia Rolling, MSN-Ed, RN,BSN [facility] Liaison [phone]

## 2017-02-05 NOTE — Progress Notes (Signed)
Discharge instructions reviewed with caregiver. Caregiver verbalized understanding and patient discharged under the  care of caregiver.

## 2017-02-05 NOTE — Discharge Summary (Signed)
Physician Discharge Summary  [name] Azure, MD GEZ:[id number] DOB: Mar 08, 1925 DOA: 02/01/2017  PCP: [name], MD  Admit date: 02/01/2017 Discharge date: 02/05/2017  Time spent: 35 minutes  Recommendations for Outpatient Follow-up:  Repeat basic metabolic panel to follow electrolytes and renal function Repeat CBC to follow hemoglobin and platelets trend  Discharge Diagnoses:  Principal Problem:   Severe sepsis ([place]) Active Problems:   Chronic kidney disease, stage 3 (HCC)   AKI (acute kidney injury) ([place])   COPD with acute exacerbation ([place])   Chronic respiratory failure with hypoxia ([place])   Chronic diastolic CHF (congestive heart failure) (HCC)   Pancytopenia (HCC)   CAD (coronary artery disease)   Emphysema   Multiple myeloma ([place])   Influenza-like illness   Discharge Condition: Stable and improved.  Patient discharged home with instruction to follow-up with his PCP in 10 days; and also to follow-up with pulmonary service on 02/13/17.  Patient will follow up next week with oncology service.  Diet recommendation: Heart healthy diet  Filed Weights   02/01/17 2039 02/02/17 2231 02/03/17 0500  Weight: 70.3 kg (155 lb) 71.6 kg (157 lb 13.6 oz) 70.6 kg (155 lb 10.3 oz)    Brief history of present illness and reason for admission:  84 y.o.malewith medical history significant foremphysema with chronic hypoxic respiratory failure, coronary artery disease, chronic diastolic CHF, and multiple myeloma on Revlimid, now presenting to the emergency department for evaluation of fevers, sinus congestion, and dyspnea. Patient was recently exposed to grandchildren with upper respiratory illness. Found to have RSV infection with COPD exacerbation.  Hospital Course:  1-RSV infection with COPD exacerbation: -Complete therapy for bronchiectasis with doxycycline (3 more days pending at discharge).   -Continue the use of flutter valve, nebulizer therapy and steroids tapering -Patient to  continue the use of Mucinex and oxygen supplementation as previously instructed by his pulmonologist -Outpatient follow-up has been set up with pulmonary service.  2-Suspected sepsis:  -sepsis features essentially resolved by the time of discharge -patient Instructed to keep himself well-hydrated.  3-hx of MM -continue care and follow up with Dr. [name]  -holding Revlimid currently   4-pancytopenia -most likely associated with treatment for MM -continue holding Revlimid until follow-up with oncology service -patient s/p 1 unit of PRBC on 1/7, Hgb in 8.2 range and stable since. -platelets count stable and not signs of acute overt bleeding at discharge.  5-chronic diastolic HF -compensated and stable. -continue daily weights and heart healthy diet   6-chronic resp failure: with underlying hx of emphysema and vocal cord dysfunction  -using 2L [place] at night time only -According to family he was intermittently compliant with the use of oxygen. -Patient educated about the importance and will continue to use oxygen 2 L at bedtime and 2 L on as-needed basis with activity.  7-acute on chronic renal failure: stage 3 at baseline  -Patient advised to keep himself well-hydrated -Cr down to 1.3 at discharge -continue minimizing the use of nephrotoxic agents   8-hx of CAD -Given no signs of overt bleeding -Will resume aspirin and Plavix at discharge. -continue Crestor   9-hypertension -Blood pressure improved and rising -Will resume home antihypertensive regimen at discharge.   Procedures:  See below for x-ray reports.  Consultations:  Oncology  PCCM  Discharge Exam: Vitals:   02/05/17 1200 02/05/17 1418  BP:  140/77  Pulse:  60  Resp:  18  Temp:  97.8 F (36.6 C)  SpO2: 97% 96%     General:  Afebrile,  using 2L of [place] supplementation and breathing comfortable. Denies CP, nausea, abd pain, vomiting or any other complaints.    Cardiovascular: No JVD  appreciated, no rubs, no gallops, S1 and S2 appreciated on exam.   Respiratory: very little expiratory wheezing today, overall with improved air movement and just scattered rhonchi on exam.   Abdomen: soft, NT, ND, positive BS.  Musculoskeletal: no edema, no cyanosis, no clubbing    Discharge Instructions   Discharge Instructions    Diet - low sodium heart healthy   Complete by:  As directed    Discharge instructions   Complete by:  As directed    Keep yourself well-hydrated Take medications as prescribed Arrange follow-up with PCP in 10 days Follow-up with pulmonologist as instructed (office will contact you with appointment details) Continue the use of oxygen supplementation 2 L every night and on as needed basis during the day (especially with activities).   Increase activity slowly   Complete by:  As directed      Allergies as of 02/05/2017   No Known Allergies     Medication List    TAKE these medications   albuterol (2.5 MG/3ML) 0.083% nebulizer solution Commonly known as:  PROVENTIL Take 3 mLs (2.5 mg total) every 2 (two) hours as needed by nebulization for wheezing or shortness of breath.   albuterol 108 (90 Base) MCG/ACT inhaler Commonly known as:  PROAIR HFA Inhale 2 puffs into the lungs every 4 (four) hours as needed for wheezing or shortness of breath.   ALPRAZolam 0.25 MG tablet Commonly known as:  XANAX Take 1 tablet (0.25 mg total) at bedtime as needed by mouth for sleep.   arformoterol 15 MCG/2ML Nebu Commonly known as:  BROVANA Take 15 mcg by nebulization 2 (two) times daily.   ascorbic acid 250 MG tablet Commonly known as:  VITAMIN C Take 1 tablet (250 mg total) by mouth 2 (two) times daily.   aspirin EC 81 MG tablet Take 1 tablet (81 mg total) by mouth at bedtime.   bisacodyl 5 MG EC tablet Commonly known as:  DULCOLAX Take 5 mg by mouth daily as needed for moderate constipation.   budesonide 0.5 MG/2ML nebulizer solution Commonly known  as:  PULMICORT Take 2 mLs (0.5 mg total) by nebulization 2 (two) times daily.   clopidogrel 75 MG tablet Commonly known as:  PLAVIX Take 75 mg by mouth daily.   cyanocobalamin 1000 MCG tablet Take 1 tablet (1,000 mcg total) daily by mouth.   doxycycline 100 MG tablet Commonly known as:  VIBRA-TABS Take 1 tablet (100 mg total) by mouth every 12 (twelve) hours for 3 days.   ferrous sulfate 325 (65 FE) MG tablet Take 1 tablet (325 mg total) by mouth 2 (two) times daily with a meal.   fluticasone 50 MCG/ACT nasal spray Commonly known as:  FLONASE Place 1 spray into both nostrils daily. What changed:    when to take this  reasons to take this   lenalidomide 10 MG capsule Commonly known as:  REVLIMID Take 1 capsule (10 mg total) by mouth daily. Take with water for 21 days on, 7 days off, repeat every 28 days.  HOLD UNTIL FOLLOW UP WITH DR. SHERRILL) What changed:  additional instructions   loratadine 10 MG tablet Commonly known as:  CLARITIN Take 1 tablet (10 mg total) by mouth daily. Start taking on:  02/06/2017   metoprolol tartrate 25 MG tablet Commonly known as:  LOPRESSOR Take 12.5 mg by mouth  2 (two) times daily. Reported on 06/06/2015   MUCINEX 600 MG 12 hr tablet Generic drug:  guaiFENesin Take 600 mg by mouth 2 (two) times daily as needed for cough or to loosen phlegm.   nitroGLYCERIN 0.4 MG SL tablet Commonly known as:  NITROSTAT Place 1 tablet (0.4 mg total) under the tongue every 5 (five) minutes as needed for chest pain. UP TO 3 DOSES THEN CALL EMS   OXYGEN Inhale 2 L into the lungs at bedtime.   pantoprazole 40 MG tablet Commonly known as:  PROTONIX Take 1 tablet (40 mg total) by mouth daily.   predniSONE 20 MG tablet Commonly known as:  DELTASONE Take 3 tablets by mouth daily times 2 days; then 2 tablets by mouth daily times 2 days; then 1 tablet by mouth daily times 3 days; then 1/2 tablet by mouth daily times 3 days and stop prednisone.    rosuvastatin 5 MG tablet Commonly known as:  CRESTOR Take 1 tablet (5 mg total) by mouth daily at 6 PM. What changed:  when to take this   sodium chloride 0.65 % Soln nasal spray Commonly known as:  OCEAN Place 2 sprays into both nostrils 2 (two) times daily.   trimethoprim 100 MG tablet Commonly known as:  TRIMPEX Take 1 tablet (100 mg total) daily by mouth.      No Known Allergies Follow-up Information    Parrett, Fonnie Mu, NP Follow up on 02/13/2017.   Specialty:  Pulmonary Disease Why:  1130 am  Contact information: [address] [address] [phone]        [name], MD. Schedule an appointment as soon as possible for a visit in 10 day(s).   Specialty:  Internal Medicine Contact information: [address] [place] [address] [phone]           The results of significant diagnostics from this hospitalization (including imaging, microbiology, ancillary and laboratory) are listed below for reference.    Significant Diagnostic Studies: Dg Chest 2 View  Result Date: 02/01/2017 CLINICAL DATA:  Cough, congestion and fever EXAM: CHEST  2 VIEW COMPARISON:  12/02/2016 FINDINGS: Emphysematous hyperinflation of the lungs. Tortuous atherosclerotic aorta without aneurysm. Left-sided ICD device with leads in the right atrium and right ventricle. Normal size heart. No pneumonic consolidation, effusion or pneumothorax. Chronic scarring and/or atelectasis along the minor fissure. No overt pulmonary edema. Chronic degenerative changes are seen along the dorsal spine. IMPRESSION: 1. Emphysematous hyperinflation of the lungs. No active pulmonary disease. 2. Aortic atherosclerosis. Electronically Signed   By: Ashley Royalty M.D.   On: 02/01/2017 20:52    Microbiology: Recent Results (from the past 240 hour(s))  Culture, blood (Routine x 2)     Status: None (Preliminary result)   Collection Time: 02/01/17  8:17 PM  Result Value Ref Range  Status   Specimen Description BLOOD LEFT ANTECUBITAL  Final   Special Requests   Final    BOTTLES DRAWN AEROBIC AND ANAEROBIC Blood Culture adequate volume   Culture   Final    NO GROWTH 3 DAYS Performed at [facility], [address], [place],  [address]    Report Status PENDING  Incomplete  Culture, blood (Routine x 2)     Status: None (Preliminary result)   Collection Time: 02/01/17  8:37 PM  Result Value Ref Range Status   Specimen Description BLOOD RIGHT FOREARM  Final   Special Requests IN PEDIATRIC BOTTLE Blood Culture adequate  volume  Final   Culture   Final    NO GROWTH 3 DAYS Performed at [facility], [place] [address] [place], [place] [address]    Report Status PENDING  Incomplete  Urine culture     Status: Abnormal   Collection Time: 02/01/17  8:42 PM  Result Value Ref Range Status   Specimen Description URINE, CATHETERIZED  Final   Special Requests NONE  Final   Culture (A)  Final    <10,000 COLONIES/mL INSIGNIFICANT GROWTH Performed at [facility], [place] [address], [place], [place] [address]    Report Status 02/03/2017 FINAL  Final  Respiratory Panel by PCR     Status: Abnormal   Collection Time: 02/02/17  4:43 AM  Result Value Ref Range Status   Adenovirus NOT DETECTED NOT DETECTED Final   Coronavirus 229E NOT DETECTED NOT DETECTED Final   Coronavirus HKU1 NOT DETECTED NOT DETECTED Final   Coronavirus NL63 NOT DETECTED NOT DETECTED Final   Coronavirus OC43 NOT DETECTED NOT DETECTED Final   Metapneumovirus NOT DETECTED NOT DETECTED Final   Rhinovirus / Enterovirus NOT DETECTED NOT DETECTED Final   Influenza A NOT DETECTED NOT DETECTED Final   Influenza B NOT DETECTED NOT DETECTED Final   Parainfluenza Virus 1 NOT DETECTED NOT DETECTED Final   Parainfluenza Virus 2 NOT DETECTED NOT DETECTED Final   Parainfluenza Virus 3 NOT DETECTED NOT DETECTED Final   Parainfluenza Virus 4 NOT DETECTED NOT DETECTED Final   Respiratory Syncytial  Virus DETECTED (A) NOT DETECTED Final    Comment: CRITICAL RESULT CALLED TO, READ BACK BY AND VERIFIED WITH: A. Lassiter RN 13:45 02/02/17 ([email or web address])    Bordetella pertussis NOT DETECTED NOT DETECTED Final   Chlamydophila pneumoniae NOT DETECTED NOT DETECTED Final   Mycoplasma pneumoniae NOT DETECTED NOT DETECTED Final  Culture, sputum-assessment     Status: None   Collection Time: 02/02/17  2:54 PM  Result Value Ref Range Status   Specimen Description SPUTUM  Final   Special Requests Immunocompromised  Final   Sputum evaluation THIS SPECIMEN IS ACCEPTABLE FOR SPUTUM CULTURE  Final   Report Status 02/02/2017 FINAL  Final  Culture, respiratory (NON-Expectorated)     Status: None   Collection Time: 02/02/17  2:54 PM  Result Value Ref Range Status   Specimen Description SPUTUM  Final   Special Requests Immunocompromised Reflexed from [id number]  Final   Gram Stain   Final    RARE WBC PRESENT, PREDOMINANTLY PMN RARE YEAST RARE GRAM NEGATIVE DIPLOCOCCI    Culture   Final    Consistent with normal respiratory flora. Performed at [facility], [place] [address], [place], [place] [address]    Report Status 02/05/2017 FINAL  Final  MRSA PCR Screening     Status: None   Collection Time: 02/02/17 10:30 PM  Result Value Ref Range Status   MRSA by PCR NEGATIVE NEGATIVE Final    Comment:        The GeneXpert MRSA Assay (FDA approved for NASAL specimens only), is one component of a comprehensive MRSA colonization surveillance program. It is not intended to diagnose MRSA infection nor to guide or monitor treatment for MRSA infections.      Labs: Basic Metabolic Panel: Recent Labs  Lab 02/01/17 2012 02/03/17 0316 02/04/17 0249 02/05/17 0341  NA 137 136 137  --   K 4.4 4.2 4.7  --   CL 103 107 106  --   CO2 28 21* 26  --  GLUCOSE 102* 159* 133*  --   BUN 30* 35* 41*  --   CREATININE 1.48* 1.62* 1.39* 1.33*  CALCIUM 8.5* 8.1* 8.5*  --    Liver Function Tests: Recent Labs   Lab 02/01/17 2012  AST 16  ALT 11*  ALKPHOS 47  BILITOT 0.8  PROT 5.8*  ALBUMIN 3.3*   CBC: Recent Labs  Lab 02/01/17 2012 02/02/17 0440 02/03/17 0316 02/04/17 0249  WBC 1.5* 1.1* 1.3* 1.1*  NEUTROABS 1.0* 0.7* 0.9*  --   HGB 8.3* 7.1* 8.2* 8.2*  HCT 25.3* 22.4* 24.3* 24.8*  MCV 74.6* 74.2* 75.2* 75.4*  PLT 27* 22* 24* 24*   BNP (last 3 results) Recent Labs    10/29/16 0706 11/10/16 1755 11/27/16 1700  BNP 56.8 91.3 124.2*   CBG: Recent Labs  Lab 02/02/17 0756 02/03/17 0800 02/04/17 0756 02/05/17 0752 02/05/17 1417  GLUCAP 152* 134* 104* 132* 150*    Signed:  Barton Dubois MD.  [facility] 02/05/2017, 3:00 PM

## 2017-02-05 NOTE — Evaluation (Signed)
Physical Therapy Evaluation Patient Details Name: [name] Sacra, MD MRN: [id number] DOB: 03/03/1925 Today's Date: 02/05/2017   History of Present Illness  84 y.o. male with medical history significant for emphysema with chronic hypoxic respiratory failure, coronary artery disease, chronic diastolic CHF, and multiple myeloma on Revlimid, now presenting to the emergency department for evaluation of fevers, sinus congestion, and dyspnea.  Patient was recently exposed to grandchildren with upper respiratory illness. Found to have RSV infection with COPD exacerbation.  Clinical Impression  Pt appears to be at baseline with mobility, his private aide confirmed this. He has 24* caregivers at home and has all needed DME including a motorized scooter and stair lift.  Pt ambulated 35' with RW, no loss of balance, distance limited by knee pain. SaO2 86% on room air ambulating, 93% on 2L O2 after 2 minutes seated rest. After ambulating, pt's aide stated he wears 2L O2 at home when walking. No further PT indicated as pt is at baseline with mobility and has needed assistance at home. Will sign off.      Follow Up Recommendations No PT follow up    Equipment Recommendations  None recommended by PT    Recommendations for Other Services       Precautions / Restrictions Precautions Precautions: Fall Precaution Comments: monitor O2 Restrictions Weight Bearing Restrictions: No      Mobility  Bed Mobility               General bed mobility comments: up in chair  Transfers Overall transfer level: Needs assistance Equipment used: Rolling walker (2 wheeled) Transfers: Sit to/from Stand Sit to Stand: Supervision         General transfer comment: no physical assist needed  Ambulation/Gait Ambulation/Gait assistance: Supervision Ambulation Distance (Feet): 80 Feet Assistive device: Rolling walker (2 wheeled) Gait Pattern/deviations: Step-through pattern;Trunk flexed     General Gait  Details: steady with RW, SaO2 86% on room air (after walking, pt's aide informed me that pt walks with 2L O2 at baseline), SaO2 93% on 2L O2 after 2 minutes seated rest, 3/4 dyspnea with walking  Stairs            Wheelchair Mobility    Modified Rankin (Stroke Patients Only)       Balance Overall balance assessment: Modified Independent                                           Pertinent Vitals/Pain Pain Assessment: No/denies pain    Home Living Family/patient expects to be discharged to:: Private residence Living Arrangements: Children Available Help at Discharge: Family;Personal care attendant;Available 24 hours/day Type of Home: House       Home Layout: Two level;Laundry or work area in [place]: Wheelchair - manual;Shower seat;Bedside commode;Walker - 2 wheels;Electric scooter Additional Comments: pt has 24* private aides    Prior Function Level of Independence: Needs assistance   Gait / Transfers Assistance Needed: walks short distances with RW, uses O2 when walking, uses motorized scooter for going out           Hand Dominance   Dominant Hand: Right    Extremity/Trunk Assessment   Upper Extremity Assessment Upper Extremity Assessment: Overall WFL for tasks assessed    Lower Extremity Assessment Lower Extremity Assessment: Overall WFL for tasks assessed    Cervical / Trunk Assessment Cervical / Trunk Assessment: Kyphotic  Communication  Communication: HOH  Cognition Arousal/Alertness: Awake/alert Behavior During Therapy: WFL for tasks assessed/performed Overall Cognitive Status: Within Functional Limits for tasks assessed                                        General Comments      Exercises     Assessment/Plan    PT Assessment Patent does not need any further PT services  PT Problem List         PT Treatment Interventions      PT Goals (Current goals can be found in the Care  Plan section)  Acute Rehab PT Goals PT Goal Formulation: All assessment and education complete, DC therapy    Frequency     Barriers to discharge        Co-evaluation               AM-PAC PT "6 Clicks" Daily Activity  Outcome Measure Difficulty turning over in bed (including adjusting bedclothes, sheets and blankets)?: A Little Difficulty moving from lying on back to sitting on the side of the bed? : A Little Difficulty sitting down on and standing up from a chair with arms (e.g., wheelchair, bedside commode, etc,.)?: A Little Help needed moving to and from a bed to chair (including a wheelchair)?: A Little Help needed walking in hospital room?: A Little Help needed climbing 3-5 steps with a railing? : A Little 6 Click Score: 18    End of Session Equipment Utilized During Treatment: Gait belt;Oxygen Activity Tolerance: Patient tolerated treatment well Patient left: in chair;with call bell/phone within reach;with chair alarm set;with family/visitor present Nurse Communication: Mobility status      Time: 8299-3716 PT Time Calculation (min) (ACUTE ONLY): 11 min   Charges:   PT Evaluation $PT Eval Low Complexity: 1 Low     PT G Codes:          Philomena Doheny 02/05/2017, 1:26 PM [phone]

## 2017-02-05 NOTE — Progress Notes (Signed)
OT Cancellation Note  Patient Details Name: [name] Siler, MD MRN: [id number] DOB: [birth year]   Cancelled Treatment:    Reason Eval/Treat Not Completed: OT screened, no needs identified, will sign off. Spoke to pt and caregiver and they report no concerns with ADLs including toileting, dressing, bathing. Caregiver is available to help with these tasks if needed also. Will sign off per their request.  Philippa Chester 02/05/2017, 1:42 PM

## 2017-02-05 NOTE — Telephone Encounter (Signed)
Spoke to patients daughter regarding upcoming January appointments.  °

## 2017-02-06 ENCOUNTER — Ambulatory Visit: Payer: Medicare Other | Admitting: Cardiovascular Disease

## 2017-02-07 LAB — CULTURE, BLOOD (ROUTINE X 2)
CULTURE: NO GROWTH
CULTURE: NO GROWTH
SPECIAL REQUESTS: ADEQUATE
SPECIAL REQUESTS: ADEQUATE

## 2017-02-10 DIAGNOSIS — D61818 Other pancytopenia: Secondary | ICD-10-CM | POA: Diagnosis not present

## 2017-02-10 DIAGNOSIS — D696 Thrombocytopenia, unspecified: Secondary | ICD-10-CM | POA: Diagnosis not present

## 2017-02-10 DIAGNOSIS — A419 Sepsis, unspecified organism: Secondary | ICD-10-CM | POA: Diagnosis not present

## 2017-02-10 DIAGNOSIS — N183 Chronic kidney disease, stage 3 (moderate): Secondary | ICD-10-CM | POA: Diagnosis not present

## 2017-02-10 DIAGNOSIS — J189 Pneumonia, unspecified organism: Secondary | ICD-10-CM | POA: Diagnosis not present

## 2017-02-10 DIAGNOSIS — J449 Chronic obstructive pulmonary disease, unspecified: Secondary | ICD-10-CM | POA: Diagnosis not present

## 2017-02-11 ENCOUNTER — Ambulatory Visit: Payer: Medicare Other | Admitting: Oncology

## 2017-02-11 ENCOUNTER — Other Ambulatory Visit: Payer: Medicare Other

## 2017-02-12 ENCOUNTER — Telehealth: Payer: Self-pay | Admitting: Oncology

## 2017-02-12 ENCOUNTER — Inpatient Hospital Stay: Payer: Medicare Other | Attending: Oncology

## 2017-02-12 ENCOUNTER — Inpatient Hospital Stay (HOSPITAL_BASED_OUTPATIENT_CLINIC_OR_DEPARTMENT_OTHER): Payer: Medicare Other | Admitting: Nurse Practitioner

## 2017-02-12 VITALS — BP 127/46 | HR 57 | Temp 97.8°F | Resp 18 | Ht 72.0 in | Wt 151.8 lb

## 2017-02-12 DIAGNOSIS — N400 Enlarged prostate without lower urinary tract symptoms: Secondary | ICD-10-CM | POA: Insufficient documentation

## 2017-02-12 DIAGNOSIS — D61818 Other pancytopenia: Secondary | ICD-10-CM

## 2017-02-12 DIAGNOSIS — I2510 Atherosclerotic heart disease of native coronary artery without angina pectoris: Secondary | ICD-10-CM

## 2017-02-12 DIAGNOSIS — I255 Ischemic cardiomyopathy: Secondary | ICD-10-CM

## 2017-02-12 DIAGNOSIS — C9000 Multiple myeloma not having achieved remission: Secondary | ICD-10-CM

## 2017-02-12 DIAGNOSIS — D563 Thalassemia minor: Secondary | ICD-10-CM | POA: Insufficient documentation

## 2017-02-12 DIAGNOSIS — Z95810 Presence of automatic (implantable) cardiac defibrillator: Secondary | ICD-10-CM | POA: Diagnosis not present

## 2017-02-12 LAB — COMPREHENSIVE METABOLIC PANEL
ALT: 14 U/L (ref 0–55)
ANION GAP: 8 (ref 3–11)
AST: 9 U/L (ref 5–34)
Albumin: 3.1 g/dL — ABNORMAL LOW (ref 3.5–5.0)
Alkaline Phosphatase: 50 U/L (ref 40–150)
BUN: 29 mg/dL — ABNORMAL HIGH (ref 7–26)
CALCIUM: 8.2 mg/dL — AB (ref 8.4–10.4)
CO2: 27 mmol/L (ref 22–29)
Chloride: 108 mmol/L (ref 98–109)
Creatinine, Ser: 1.09 mg/dL (ref 0.70–1.30)
GFR, EST NON AFRICAN AMERICAN: 58 mL/min — AB (ref >60)
Glucose, Bld: 157 mg/dL — ABNORMAL HIGH (ref 70–140)
Potassium: 5 mmol/L (ref 3.5–5.1)
Sodium: 143 mmol/L (ref 136–145)
TOTAL PROTEIN: 5.4 g/dL — AB (ref 6.4–8.3)
Total Bilirubin: 0.5 mg/dL (ref 0.2–1.2)

## 2017-02-12 LAB — CBC WITH DIFFERENTIAL/PLATELET
BASOS ABS: 0 10*3/uL (ref 0.0–0.1)
BASOS PCT: 0 %
EOS ABS: 0 10*3/uL (ref 0.0–0.5)
EOS PCT: 0 %
HCT: 29.6 % — ABNORMAL LOW (ref 38.4–49.9)
Hemoglobin: 9.4 g/dL — ABNORMAL LOW (ref 13.0–17.1)
Lymphocytes Relative: 15 %
Lymphs Abs: 0.4 10*3/uL — ABNORMAL LOW (ref 0.9–3.3)
MCH: 24 pg — AB (ref 27.2–33.4)
MCHC: 31.7 g/dL — ABNORMAL LOW (ref 32.0–36.0)
MCV: 75.8 fL — ABNORMAL LOW (ref 79.3–98.0)
Monocytes Absolute: 0.1 10*3/uL (ref 0.1–0.9)
Monocytes Relative: 4 %
NEUTROS PCT: 81 %
Neutro Abs: 2.3 10*3/uL (ref 1.5–6.5)
PLATELETS: 122 10*3/uL — AB (ref 140–400)
RBC: 3.91 MIL/uL — AB (ref 4.20–5.82)
RDW: 19.4 % — ABNORMAL HIGH (ref 11.0–15.6)
WBC: 2.8 10*3/uL — AB (ref 4.0–10.3)

## 2017-02-12 LAB — SAMPLE TO BLOOD BANK

## 2017-02-12 MED ORDER — LENALIDOMIDE 10 MG PO CAPS: 10.0000 mg | ORAL_CAPSULE | Freq: Every day | ORAL | 0 refills | Status: DC

## 2017-02-12 MED ORDER — ALPRAZOLAM 0.25 MG PO TABS
0.2500 mg | ORAL_TABLET | Freq: Every evening | ORAL | 0 refills | Status: DC | PRN
Start: 2017-02-12 — End: 2017-03-04

## 2017-02-12 NOTE — Telephone Encounter (Signed)
Gave avs and calendar for february °

## 2017-02-12 NOTE — Progress Notes (Addendum)
  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  He was hospitalized 02/02/2017 presenting with fever, malaise and increased cough.  He was found to have RSV infection with COPD exacerbation, suspected sepsis.  He had pancytopenia possibly related to a combination of Revlimid and multiple myeloma.  Revlimid currently on hold. He was transfused a unit of blood on 02/02/2017.  He was discharged home 02/05/2017.  He is feeling much better.  The shortness of breath and cough are improved.  He denies nausea.  No diarrhea.  He notes some constipation when taking Revlimid.  Appetite is improving.  He occasionally takes Xanax 0.25 mg at bedtime to help with sleep and would like a refill on this.  Objective:  Vital signs in last 24 hours:  Blood pressure (!) 127/46, pulse (!) 57, temperature 97.8 F (36.6 C), temperature source Oral, resp. rate 18, height 6' (1.829 m), weight 151 lb 12.8 oz (68.9 kg), SpO2 99 %.    HEENT: No thrush or ulcers. Resp: Distant breath sounds. Cardio: Regular rate and rhythm. GI: Abdomen soft and nontender.  No hepatosplenomegaly. Vascular: Trace bilateral pretibial edema.  Lab Results:  Lab Results  Component Value Date   WBC 2.8 (L) 02/12/2017   HGB 9.4 (L) 02/12/2017   HCT 29.6 (L) 02/12/2017   MCV 75.8 (L) 02/12/2017   PLT 122 (L) 02/12/2017   NEUTROABS 2.3 02/12/2017    Imaging:  No results found.  Medications: I have reviewed the patient's current medications.  Assessment/Plan: 1. Pancytopenia-transfused with packed red blood cells 11/28/2016, 12/27/2016, 01/13/2017, 02/02/2017 2. History of beta thalassemia trait 3. History of vitamin B-12 deficiency-vitamin B-12 replacement initiated 11/11/2016 4. History of a serum monoclonal IgAkappaprotein-elevated serum free kappa light chains, IgA, and M spike10/16/2018  Bone marrow biopsy 12/03/2016-consistent with multiple  myeloma  Bone survey 12/06/2016-no destructive or lytic bone lesions identified.  Revlimid 21 days on/7 days off and weekly dexamethasone 12/16/2016(Decadron discontinued 12/29/2016 secondary to altered mental status)  Cycle 2 single agent Revlimid 01/14/2017 (discontinued 3 days early)  Serum light chains, IgA improved 02/02/2017  Cycle 3 single agent Revlimid beginning 02/16/2017 5. COPD with acute and chronic respiratory failure/hypoxemia 6. History of coronary artery disease 7. Ischemic cardiomyopathy 8. ICD in place 9. Prostatic hypertrophy 10.Inappropriately low erythropoietin level 11/28/2016 11. Severe pancytopenia secondary to multiple myeloma and Revlimid 12.  Fever-likely related to an upper respiratory infection, RSV positive swab 02/02/2017  Disposition: 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  Myeloma labs obtained during the recent hospitalization showed improvement (IgA and serum light chains).  Blood counts are better.  Plan to proceed with a third cycle of Revlimid beginning 02/16/2017.  He will return for a follow-up visit and labs on 03/02/2017.  He will contact the office in the interim with any problems.  Patient seen with [name].  Ned Card ANP/GNP-BC   02/12/2017  4:19 PM  This was a shared visit with Ned Card.  [name] appears well today.  There has been significant improvement in the pancytopenia since discharge from the hospital last week.  The IgA and light chains were lower while in the hospital.  He appears to be responding to the Revlimid.  He will begin another cycle of Revlimid 02/16/2017.  [name] Manson, MD

## 2017-02-13 ENCOUNTER — Ambulatory Visit (INDEPENDENT_AMBULATORY_CARE_PROVIDER_SITE_OTHER): Payer: Medicare Other | Admitting: Adult Health

## 2017-02-13 ENCOUNTER — Encounter: Payer: Self-pay | Admitting: Adult Health

## 2017-02-13 DIAGNOSIS — J441 Chronic obstructive pulmonary disease with (acute) exacerbation: Secondary | ICD-10-CM | POA: Diagnosis not present

## 2017-02-13 DIAGNOSIS — J9621 Acute and chronic respiratory failure with hypoxia: Secondary | ICD-10-CM

## 2017-02-13 DIAGNOSIS — I5032 Chronic diastolic (congestive) heart failure: Secondary | ICD-10-CM | POA: Diagnosis not present

## 2017-02-13 LAB — IGA: IGA: 331 mg/dL (ref 61–437)

## 2017-02-13 LAB — KAPPA/LAMBDA LIGHT CHAINS
KAPPA FREE LGHT CHN: 150.9 mg/L — AB (ref 3.3–19.4)
KAPPA, LAMDA LIGHT CHAIN RATIO: 14.65 — AB (ref 0.26–1.65)
LAMDA FREE LIGHT CHAINS: 10.3 mg/L (ref 5.7–26.3)

## 2017-02-13 NOTE — Progress Notes (Signed)
@Patient  ID: [name] Azure, MD, male    DOB: 1925-04-13, 84 y.o.   MRN: [id number]  Chief Complaint  Patient presents with  . Follow-up    COPD     Referring provider: [name], MD  HPI: 84 year old male, previous dentist, former smoker followed for Asthma/COPD and O2 RF  Hx Diastolic CHF Chronic urinary retention -self caths   TEST  PFT's 04/16/11 FEV1 1.57 (60%) ratio 71 and 12% better p B2, dlco 41 corrects to 58  02/13/2017 Follow up : COPD , O2 RF  Patient presents for a post hospital follow-up.  Patient was recently admitted to the hospital earlier this month for COPD exacerbation.Marland Kitchen  He was found to be positive for RSV infection.  He was treated with IV antibiotics steroids and nebulized bronchodilators.  He was discharged on doxycycline.  Patient says since discharge he is feeling better.  Breathing has returned back to baseline.  He denies any increased cough or congestion. Patient is on oxygen 2 L with activity.  He does say that sometimes he does not wear this on a consistent basis.  Compliance was encouraged.  He does wear oxygen 2 L at bedtime.  Patient is accompanied by his home health nurse.  He denies any hemoptysis chest pain orthopnea PND or increased leg swelling.   No Known Allergies  Immunization History  Administered Date(s) Administered  . Influenza Split 10/28/2010, 10/27/2012  . Influenza Whole 12/08/2011  . Influenza, High Dose Seasonal PF 10/16/2016  . Pneumococcal Conjugate-13 05/26/2013  . Pneumococcal Polysaccharide-23 10/31/1998    Past Medical History:  Diagnosis Date  . Anemia, unspecified   . CAD (coronary artery disease)   . Cataracts, bilateral   . CHF (congestive heart failure) ([place])   . Chronic airway obstruction, not elsewhere classified    on o2 at night  . Diabetes mellitus   . Diverticulosis of colon (without mention of hemorrhage)   . Dyslipidemia   . Emphysema   . Hemorrhoids   . ICD (implantable cardiac  defibrillator) in place    st jude  . Ischemic cardiomyopathy   . Macular degeneration   . MI, old 2010 or 2011  . Prostatic hypertrophy    hx of  . Retention of urine, unspecified   . Unspecified disorder resulting from impaired renal function     Tobacco History: Social History   Tobacco Use  Smoking Status Former Smoker  . Types: Cigarettes  . Last attempt to quit: 04/27/2009  . Years since quitting: 7.8  Smokeless Tobacco Never Used   Counseling given: Not Answered   Outpatient Encounter Medications as of 02/13/2017  Medication Sig  . albuterol (PROAIR HFA) 108 (90 Base) MCG/ACT inhaler Inhale 2 puffs into the lungs every 4 (four) hours as needed for wheezing or shortness of breath.  Marland Kitchen albuterol (PROVENTIL) (2.5 MG/3ML) 0.083% nebulizer solution Take 3 mLs (2.5 mg total) every 2 (two) hours as needed by nebulization for wheezing or shortness of breath.  . ALPRAZolam (XANAX) 0.25 MG tablet Take 1 tablet (0.25 mg total) by mouth at bedtime as needed for sleep.  Marland Kitchen arformoterol (BROVANA) 15 MCG/2ML NEBU Take 15 mcg by nebulization 2 (two) times daily.  Marland Kitchen aspirin EC 81 MG tablet Take 1 tablet (81 mg total) by mouth at bedtime.  . bisacodyl (DULCOLAX) 5 MG EC tablet Take 5 mg by mouth daily as needed for moderate constipation.  . budesonide (PULMICORT) 0.5 MG/2ML nebulizer solution Take 2 mLs (0.5 mg total) by nebulization  2 (two) times daily.  . clopidogrel (PLAVIX) 75 MG tablet Take 75 mg by mouth daily.  . ferrous sulfate 325 (65 FE) MG tablet Take 1 tablet (325 mg total) by mouth 2 (two) times daily with a meal.  . fluticasone (FLONASE) 50 MCG/ACT nasal spray Place 1 spray into both nostrils daily. (Patient taking differently: Place 1 spray into both nostrils daily as needed for allergies. )  . guaiFENesin (MUCINEX) 600 MG 12 hr tablet Take 600 mg by mouth 2 (two) times daily as needed for cough or to loosen phlegm.   Marland Kitchen lenalidomide (REVLIMID) 10 MG capsule Take 1 capsule (10 mg  total) by mouth daily. Take with water for 21 days on, 7 days off, repeat every 28 days.  Marland Kitchen loratadine (CLARITIN) 10 MG tablet Take 1 tablet (10 mg total) by mouth daily.  . metoprolol tartrate (LOPRESSOR) 25 MG tablet Take 12.5 mg by mouth 2 (two) times daily. Reported on 06/06/2015  . nitroGLYCERIN (NITROSTAT) 0.4 MG SL tablet Place 1 tablet (0.4 mg total) under the tongue every 5 (five) minutes as needed for chest pain. UP TO 3 DOSES THEN CALL EMS  . OXYGEN Inhale 2 L into the lungs at bedtime.   . pantoprazole (PROTONIX) 40 MG tablet Take 1 tablet (40 mg total) by mouth daily.  . predniSONE (DELTASONE) 20 MG tablet Take 3 tablets by mouth daily times 2 days; then 2 tablets by mouth daily times 2 days; then 1 tablet by mouth daily times 3 days; then 1/2 tablet by mouth daily times 3 days and stop prednisone.  . rosuvastatin (CRESTOR) 5 MG tablet Take 1 tablet (5 mg total) by mouth daily at 6 PM. (Patient taking differently: Take 5 mg by mouth at bedtime. )  . sodium chloride (OCEAN) 0.65 % SOLN nasal spray Place 2 sprays into both nostrils 2 (two) times daily.  Marland Kitchen trimethoprim (TRIMPEX) 100 MG tablet Take 1 tablet (100 mg total) daily by mouth.  . vitamin B-12 1000 MCG tablet Take 1 tablet (1,000 mcg total) daily by mouth.  . vitamin C (VITAMIN C) 250 MG tablet Take 1 tablet (250 mg total) by mouth 2 (two) times daily.   Facility-Administered Encounter Medications as of 02/13/2017  Medication  . 0.9 %  sodium chloride infusion     Review of Systems  Constitutional:   No  weight loss, night sweats,  Fevers, chills, + fatigue, or  lassitude.  HEENT:   No headaches,  Difficulty swallowing,  Tooth/dental problems, or  Sore throat,                No sneezing, itching, ear ache, nasal congestion, post nasal drip,   CV:  No chest pain,  Orthopnea, PND, swelling in lower extremities, anasarca, dizziness, palpitations, syncope.   GI  No heartburn, indigestion, abdominal pain, nausea, vomiting,  diarrhea, change in bowel habits, loss of appetite, bloody stools.   Resp: .  No chest wall deformity  Skin: no rash or lesions.  GU: no dysuria, change in color of urine, no urgency or frequency.  No flank pain, no hematuria   MS:  No joint pain or swelling.  No decreased range of motion.  No back pain.    Physical Exam  BP 118/62 (BP Location: Left Arm, Cuff Size: Normal)   Pulse 64   Ht 6' (1.829 m)   Wt 151 lb (68.5 kg)   SpO2 96%   BMI 20.48 kg/m   GEN: A/Ox3; pleasant , NAD,  elderly and wheelchair   HEENT:  [place]/AT,  EACs-clear, TMs-wnl, NOSE-clear, THROAT-clear, no lesions, no postnasal drip or exudate noted.   NECK:  Supple w/ fair ROM; no JVD; normal carotid impulses w/o bruits; no thyromegaly or nodules palpated; no lymphadenopathy.    RESP diminished bases  no accessory muscle use, no dullness to percussion  CARD:  RRR, no m/r/g, trace peripheral edema, pulses intact, no cyanosis or clubbing.  GI:   Soft & nt; nml bowel sounds; no organomegaly or masses detected.   Musco: Warm bil, no deformities or joint swelling noted.   Neuro: alert, no focal deficits noted.    Skin: Warm, no lesions or rashes    Lab Results:  CBC    Component Value Date/Time   WBC 2.8 (L) 02/12/2017 1521   RBC 3.91 (L) 02/12/2017 1521   HGB 9.4 (L) 02/12/2017 1521   HGB 7.4 (L) 01/12/2017 1538   HCT 29.6 (L) 02/12/2017 1521   HCT 23.8 (L) 01/12/2017 1538   PLT 122 (L) 02/12/2017 1521   PLT 89 (L) 01/12/2017 1538   MCV 75.8 (L) 02/12/2017 1521   MCV 74.3 (L) 01/12/2017 1538   MCH 24.0 (L) 02/12/2017 1521   MCHC 31.7 (L) 02/12/2017 1521   RDW 19.4 (H) 02/12/2017 1521   RDW 21.0 (H) 01/12/2017 1538   LYMPHSABS 0.4 (L) 02/12/2017 1521   LYMPHSABS 0.6 (L) 01/12/2017 1538   MONOABS 0.1 02/12/2017 1521   MONOABS 0.3 01/12/2017 1538   EOSABS 0.0 02/12/2017 1521   EOSABS 0.1 01/12/2017 1538   BASOSABS 0.0 02/12/2017 1521   BASOSABS 0.1 01/12/2017 1538    BMET    Component  Value Date/Time   NA 143 02/12/2017 1521   NA 140 01/12/2017 1538   K 5.0 02/12/2017 1521   K 4.4 01/12/2017 1538   CL 108 02/12/2017 1521   CO2 27 02/12/2017 1521   CO2 27 01/12/2017 1538   GLUCOSE 157 (H) 02/12/2017 1521   GLUCOSE 84 01/12/2017 1538   BUN 29 (H) 02/12/2017 1521   BUN 23.7 01/12/2017 1538   CREATININE 1.09 02/12/2017 1521   CREATININE 1.0 01/12/2017 1538   CALCIUM 8.2 (L) 02/12/2017 1521   CALCIUM 8.7 01/12/2017 1538   GFRNONAA 58 (L) 02/12/2017 1521   GFRAA >60 02/12/2017 1521    BNP    Component Value Date/Time   BNP 124.2 (H) 11/27/2016 1700   BNP 45.9 06/06/2015 1629    ProBNP    Component Value Date/Time   PROBNP 759.3 (H) 11/11/2013 2139    Imaging: Dg Chest 2 View  Result Date: 02/01/2017 CLINICAL DATA:  Cough, congestion and fever EXAM: CHEST  2 VIEW COMPARISON:  12/02/2016 FINDINGS: Emphysematous hyperinflation of the lungs. Tortuous atherosclerotic aorta without aneurysm. Left-sided ICD device with leads in the right atrium and right ventricle. Normal size heart. No pneumonic consolidation, effusion or pneumothorax. Chronic scarring and/or atelectasis along the minor fissure. No overt pulmonary edema. Chronic degenerative changes are seen along the dorsal spine. IMPRESSION: 1. Emphysematous hyperinflation of the lungs. No active pulmonary disease. 2. Aortic atherosclerosis. Electronically Signed   By: Ashley Royalty M.D.   On: 02/01/2017 20:52     Assessment & Plan:   No problem-specific Assessment & Plan notes found for this encounter.     Rexene Edison, NP 02/13/2017

## 2017-02-13 NOTE — Patient Instructions (Signed)
Continue on Brovana and Pulmicort neb Twice daily .  Albuterol neb every 4hr as needed -this is your rescue .  Albuterol Inhaler 2 puffs every 4hrs as needed -rescue inhaler .  Continue Oxygen Oxygen 2l/m.  Follow up with Dr. Elsworth Soho in 2-3 months and As needed   Please contact office for sooner follow up if symptoms do not improve or worsen or seek emergency care

## 2017-02-13 NOTE — Assessment & Plan Note (Signed)
.    Patient is continue on oxygen 2 L to keep oxygen above 88-90% with act and  2l/m At bedtime

## 2017-02-13 NOTE — Assessment & Plan Note (Signed)
Recent exacerbation now resolved.  Patient will continue on his current regimen  Plan  Patient Instructions  Continue on Brovana and Pulmicort neb Twice daily .  Albuterol neb every 4hr as needed -this is your rescue .  Albuterol Inhaler 2 puffs every 4hrs as needed -rescue inhaler .  Continue Oxygen Oxygen 2l/m.  Follow up with Dr. Elsworth Soho in 2-3 months and As needed   Please contact office for sooner follow up if symptoms do not improve or worsen or seek emergency care

## 2017-02-13 NOTE — Assessment & Plan Note (Signed)
Compensated without evidence of volume overload

## 2017-02-16 LAB — PROTEIN ELECTROPHORESIS, SERUM
A/G RATIO SPE: 1.4 (ref 0.7–1.7)
ALPHA-2-GLOBULIN: 0.6 g/dL (ref 0.4–1.0)
Albumin ELP: 3 g/dL (ref 2.9–4.4)
Alpha-1-Globulin: 0.2 g/dL (ref 0.0–0.4)
BETA GLOBULIN: 0.9 g/dL (ref 0.7–1.3)
GLOBULIN, TOTAL: 2.1 g/dL — AB (ref 2.2–3.9)
Gamma Globulin: 0.4 g/dL (ref 0.4–1.8)
M-SPIKE, %: 0.2 g/dL — AB
Total Protein ELP: 5.1 g/dL — ABNORMAL LOW (ref 6.0–8.5)

## 2017-02-19 ENCOUNTER — Ambulatory Visit (INDEPENDENT_AMBULATORY_CARE_PROVIDER_SITE_OTHER): Payer: Medicare Other | Admitting: *Deleted

## 2017-02-19 ENCOUNTER — Telehealth: Payer: Self-pay

## 2017-02-19 DIAGNOSIS — I5022 Chronic systolic (congestive) heart failure: Secondary | ICD-10-CM

## 2017-02-19 DIAGNOSIS — I255 Ischemic cardiomyopathy: Secondary | ICD-10-CM

## 2017-02-19 DIAGNOSIS — Z95810 Presence of automatic (implantable) cardiac defibrillator: Secondary | ICD-10-CM

## 2017-02-19 NOTE — Telephone Encounter (Signed)
Remote ICM transmission received.  Attempted call to daughter and left message to call back if needed.

## 2017-02-19 NOTE — Progress Notes (Signed)
Remote ICD transmission.   

## 2017-02-19 NOTE — Progress Notes (Signed)
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   Left detailed message.  Transmission reviewed   Thoracic impedance normal but was abnormal suggesting fluid accumulation from 01/29/2017 to 02/08/2017 which correlates with hospitalization for flu.  No diuretic listed in med list  Recommendations: Left voice mail with ICM number and encouraged to call if experiencing any fluid symptoms.  Follow-up plan: ICM clinic phone appointment on 03/24/2017.    Copy of ICM check sent to Dr. Rayann Heman.   3 month ICM trend: 02/19/2017    1 Year ICM trend:       Rosalene Billings, RN 02/19/2017 2:34 PM

## 2017-02-20 ENCOUNTER — Encounter: Payer: Self-pay | Admitting: Cardiology

## 2017-02-25 ENCOUNTER — Other Ambulatory Visit: Payer: Self-pay | Admitting: Nurse Practitioner

## 2017-02-25 ENCOUNTER — Telehealth: Payer: Self-pay | Admitting: *Deleted

## 2017-02-25 ENCOUNTER — Telehealth: Payer: Self-pay

## 2017-02-25 DIAGNOSIS — Z20828 Contact with and (suspected) exposure to other viral communicable diseases: Secondary | ICD-10-CM

## 2017-02-25 MED ORDER — OSELTAMIVIR PHOSPHATE 30 MG PO CAPS: 30.0000 mg | ORAL_CAPSULE | Freq: Every day | ORAL | 0 refills | Status: AC

## 2017-02-25 NOTE — Telephone Encounter (Signed)
Returned call from pt daughter. Daughter calling to request Tamiflu prescription. "My dad was exposed to the flu Saturday, but we just found out that my sister had it today". Per daughter, she is unsure if pt is exhibiting any symptoms, "I'll keep my eye on him". Per Lattie Haw, NP, indications lead towards pt still receiving prophylactic dose even though it has been greater than 48 hours post exposure. Will send prescription in to pharmacy. Pt daughter to call with any further questions or concerns. Voiced understanding and is agreeable with plan.

## 2017-02-25 NOTE — Telephone Encounter (Signed)
Shared call information with on-call provider.  Verbal order received and read back for family to monitor patient for fever, aches, chills, weakness, cough, headache and other flu symptoms.  Tamiflu administration is 24 to 48 hours.    FYI, call information.  "Trying to reach someone before you close  about my Dad.  Dr. [name] needs to order Tamiflu.  Saturday he was exposed to flu.  My sister was told all we need to do is call and ask for Tamiflu when she tested positive today she has the flu.  The flu will kill him.  Already weak/'fatigued at 84 years old." Shared provider information.  If she chooses flu testing for Dad, ideal time missed for Tamiflu.  Report to ED with start of any symptoms.

## 2017-03-02 ENCOUNTER — Inpatient Hospital Stay: Payer: Medicare Other

## 2017-03-02 ENCOUNTER — Telehealth: Payer: Self-pay | Admitting: Oncology

## 2017-03-02 ENCOUNTER — Inpatient Hospital Stay: Payer: Medicare Other | Attending: Oncology | Admitting: Oncology

## 2017-03-02 VITALS — BP 138/62 | HR 65 | Temp 98.3°F | Resp 18 | Ht 72.0 in | Wt 149.6 lb

## 2017-03-02 DIAGNOSIS — C9000 Multiple myeloma not having achieved remission: Secondary | ICD-10-CM | POA: Diagnosis not present

## 2017-03-02 DIAGNOSIS — D61818 Other pancytopenia: Secondary | ICD-10-CM | POA: Diagnosis not present

## 2017-03-02 DIAGNOSIS — D563 Thalassemia minor: Secondary | ICD-10-CM | POA: Insufficient documentation

## 2017-03-02 LAB — CMP (CANCER CENTER ONLY)
ALBUMIN: 3.4 g/dL — AB (ref 3.5–5.0)
ALK PHOS: 58 U/L (ref 40–150)
ALT: 9 U/L (ref 0–55)
AST: 11 U/L (ref 5–34)
Anion gap: 8 (ref 3–11)
BILIRUBIN TOTAL: 0.5 mg/dL (ref 0.2–1.2)
BUN: 24 mg/dL (ref 7–26)
CALCIUM: 8.6 mg/dL (ref 8.4–10.4)
CO2: 28 mmol/L (ref 22–29)
CREATININE: 1.25 mg/dL (ref 0.70–1.30)
Chloride: 105 mmol/L (ref 98–109)
GFR, Est AFR Am: 57 mL/min — ABNORMAL LOW (ref >60)
GFR, Estimated: 49 mL/min — ABNORMAL LOW (ref >60)
GLUCOSE: 90 mg/dL (ref 70–140)
Potassium: 4.1 mmol/L (ref 3.5–5.1)
Sodium: 141 mmol/L (ref 136–145)
TOTAL PROTEIN: 5.9 g/dL — AB (ref 6.4–8.3)

## 2017-03-02 LAB — CBC WITH DIFFERENTIAL (CANCER CENTER ONLY)
BASOS ABS: 0 10*3/uL (ref 0.0–0.1)
BASOS PCT: 0 %
EOS ABS: 0.3 10*3/uL (ref 0.0–0.5)
Eosinophils Relative: 7 %
HEMATOCRIT: 27.1 % — AB (ref 38.4–49.9)
HEMOGLOBIN: 8.6 g/dL — AB (ref 13.0–17.1)
Lymphocytes Relative: 24 %
Lymphs Abs: 0.9 10*3/uL (ref 0.9–3.3)
MCH: 24 pg — ABNORMAL LOW (ref 27.2–33.4)
MCHC: 31.7 g/dL — AB (ref 32.0–36.0)
MCV: 75.7 fL — ABNORMAL LOW (ref 79.3–98.0)
MONOS PCT: 9 %
Monocytes Absolute: 0.4 10*3/uL (ref 0.1–0.9)
NEUTROS ABS: 2.2 10*3/uL (ref 1.5–6.5)
NEUTROS PCT: 60 %
Platelet Count: 66 10*3/uL — ABNORMAL LOW (ref 140–400)
RBC: 3.58 MIL/uL — AB (ref 4.20–5.82)
RDW: 18.8 % — ABNORMAL HIGH (ref 11.0–14.6)
WBC: 3.8 10*3/uL — AB (ref 4.0–10.3)

## 2017-03-02 NOTE — Telephone Encounter (Signed)
Patient scheduled and given AVS?Calendar per 2/4 los

## 2017-03-02 NOTE — Progress Notes (Signed)
  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  He began another cycle of Revlimid 02/16/2017.  He had a root canal on a sore right lower tooth.  He took amoxicillin surrounding this procedure.  He had an exposure to influenza last week and took Tamiflu prophylaxis.  He denies flu symptoms.  Objective:  Vital signs in last 24 hours:  Blood pressure 138/62, pulse 65, temperature 98.3 F (36.8 C), temperature source Oral, resp. rate 18, height 6' (1.829 m), weight 149 lb 9.6 oz (67.9 kg), SpO2 97 %.    HEENT: No thrush, no erythema or discharge at the right lower gumline Resp: Distant breath sounds, no respiratory distress Cardio: Regular rate and rhythm GI: No hepatosplenomegaly, nontender Vascular: No leg edema  Labs: Hemabate 8.6, platelets 66,000, white count 3.8, ANC 2.2 Medications: I have reviewed the patient's current medications.   Assessment/Plan: 1. Pancytopenia-transfused with packed red blood cells 11/28/2016, 12/27/2016, 01/13/2017, 02/02/2017 2. History of beta thalassemia trait 3. History of vitamin B-12 deficiency-vitamin B-12 replacement initiated 11/11/2016 4. History of a serum monoclonal IgAkappaprotein-elevated serum free kappa light chains, IgA, and M spike10/16/2018  Bone marrow biopsy 12/03/2016-consistent with multiple myeloma  Bone survey 12/06/2016-no destructive or lytic bone lesions identified.  Revlimid 21 days on/7 days off and weekly dexamethasone 12/16/2016(Decadron discontinued 12/29/2016 secondary to altered mental status)  Cycle 2 single agent Revlimid 01/14/2017 (discontinued 3 days early)  Serum light chains, IgA improved 02/02/2017  Cycle 3 single agent Revlimid beginning 02/16/2017 5. COPD with acute and chronic respiratory failure/hypoxemia 6. History of coronary artery disease 7. Ischemic cardiomyopathy 8. ICD in place 9. Prostatic  hypertrophy 10.Inappropriately low erythropoietin level 11/28/2016 11. Severe pancytopenia secondary to multiple myeloma and Revlimid 12. 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  He will complete cycle 3 Revlimid over this week.  He will return for lab visit 03/12/2017.  We will arrange for a transfusion as indicated.  The plan is to begin cycle 4 Revlimid on 03/16/2017.  The IgA level was slightly higher on 02/12/2017.  We will follow-up on the IgA level and serum light chains from today.  The platelets are lower today, likely secondary to Revlimid.  He does not need a transfusion today and his overall performance status appears improved.  25 minutes were spent with the patient today.  The majority of the time was used for counseling and coordination of care.  [name], MD  03/02/2017  4:58 PM

## 2017-03-03 LAB — KAPPA/LAMBDA LIGHT CHAINS
Kappa free light chain: 162.6 mg/L — ABNORMAL HIGH (ref 3.3–19.4)
Kappa, lambda light chain ratio: 9.74 — ABNORMAL HIGH (ref 0.26–1.65)
Lambda free light chains: 16.7 mg/L (ref 5.7–26.3)

## 2017-03-03 LAB — IGA: IgA: 340 mg/dL (ref 61–437)

## 2017-03-04 ENCOUNTER — Other Ambulatory Visit: Payer: Self-pay | Admitting: *Deleted

## 2017-03-04 DIAGNOSIS — C9000 Multiple myeloma not having achieved remission: Secondary | ICD-10-CM

## 2017-03-04 LAB — PROTEIN ELECTROPHORESIS, SERUM
A/G Ratio: 1.5 (ref 0.7–1.7)
Albumin ELP: 3.4 g/dL (ref 2.9–4.4)
Alpha-1-Globulin: 0.3 g/dL (ref 0.0–0.4)
Alpha-2-Globulin: 0.6 g/dL (ref 0.4–1.0)
Beta Globulin: 1 g/dL (ref 0.7–1.3)
GAMMA GLOBULIN: 0.3 g/dL — AB (ref 0.4–1.8)
Globulin, Total: 2.3 g/dL (ref 2.2–3.9)
M-Spike, %: 0.2 g/dL — ABNORMAL HIGH
TOTAL PROTEIN ELP: 5.7 g/dL — AB (ref 6.0–8.5)

## 2017-03-04 MED ORDER — ALPRAZOLAM 0.25 MG PO TABS: 0.2500 mg | ORAL_TABLET | Freq: Every evening | ORAL | 0 refills | Status: DC | PRN

## 2017-03-05 ENCOUNTER — Telehealth: Payer: Self-pay | Admitting: *Deleted

## 2017-03-05 NOTE — Telephone Encounter (Signed)
Notified [name] of lab result, per MD note below. She voiced appreciation for call.

## 2017-03-05 NOTE — Telephone Encounter (Signed)
-----   Message from Ladell Pier, MD sent at 03/04/2017  6:26 PM EST ----- Please call patient daughter, light chains and IgA levels are stable, continue revlimid, f/u as scheduled

## 2017-03-11 ENCOUNTER — Other Ambulatory Visit: Payer: Self-pay

## 2017-03-11 DIAGNOSIS — C9000 Multiple myeloma not having achieved remission: Secondary | ICD-10-CM

## 2017-03-11 MED ORDER — LENALIDOMIDE 10 MG PO CAPS: 10.0000 mg | ORAL_CAPSULE | Freq: Every day | ORAL | 0 refills | Status: DC

## 2017-03-12 ENCOUNTER — Other Ambulatory Visit: Payer: Medicare Other

## 2017-03-16 LAB — CUP PACEART REMOTE DEVICE CHECK
Implantable Lead Implant Date: 20111117
Implantable Lead Location: 753859
Implantable Lead Model: 7122
Lead Channel Setting Pacing Amplitude: 2.5 V
Lead Channel Setting Pacing Pulse Width: 0.5 ms
MDC IDC LEAD IMPLANT DT: 20111117
MDC IDC LEAD LOCATION: 753860
MDC IDC PG IMPLANT DT: 20111117
MDC IDC SESS DTM: 20190218222305
MDC IDC SET LEADCHNL RA PACING AMPLITUDE: 2 V
MDC IDC SET LEADCHNL RV SENSING SENSITIVITY: 0.5 mV
Pulse Gen Serial Number: 623396

## 2017-03-24 ENCOUNTER — Ambulatory Visit (INDEPENDENT_AMBULATORY_CARE_PROVIDER_SITE_OTHER): Payer: Medicare Other

## 2017-03-24 DIAGNOSIS — I5022 Chronic systolic (congestive) heart failure: Secondary | ICD-10-CM

## 2017-03-24 DIAGNOSIS — Z95810 Presence of automatic (implantable) cardiac defibrillator: Secondary | ICD-10-CM | POA: Diagnosis not present

## 2017-03-24 NOTE — Progress Notes (Signed)
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  Left detailed message.  Transmission reviewed   Thoracic impedance normal but was abnormal suggesting fluid accumulation from 03/04/2017 through 03/16/2017.  No diuretic listed in med list  Recommendations: Left voice mail with ICM number and encouraged to call if experiencing any fluid symptoms.  Follow-up plan: ICM clinic phone appointment on 05/25/2017.  Office appointment scheduled 04/22/2017 with Tommye Standard, PA.  Copy of ICM check sent to Dr. Rayann Heman.   3 month ICM trend: 03/24/2017    1 Year ICM trend:       Rosalene Billings, RN 03/24/2017 10:39 AM

## 2017-04-02 ENCOUNTER — Telehealth: Payer: Self-pay | Admitting: Oncology

## 2017-04-02 ENCOUNTER — Inpatient Hospital Stay: Payer: Medicare Other | Attending: Oncology

## 2017-04-02 ENCOUNTER — Inpatient Hospital Stay (HOSPITAL_BASED_OUTPATIENT_CLINIC_OR_DEPARTMENT_OTHER): Payer: Medicare Other | Admitting: Oncology

## 2017-04-02 VITALS — BP 135/94 | HR 61 | Temp 97.8°F | Resp 17 | Ht 72.0 in | Wt 148.1 lb

## 2017-04-02 DIAGNOSIS — D61818 Other pancytopenia: Secondary | ICD-10-CM | POA: Insufficient documentation

## 2017-04-02 DIAGNOSIS — C9000 Multiple myeloma not having achieved remission: Secondary | ICD-10-CM | POA: Diagnosis not present

## 2017-04-02 DIAGNOSIS — E538 Deficiency of other specified B group vitamins: Secondary | ICD-10-CM | POA: Insufficient documentation

## 2017-04-02 DIAGNOSIS — R410 Disorientation, unspecified: Secondary | ICD-10-CM | POA: Diagnosis not present

## 2017-04-02 LAB — BASIC METABOLIC PANEL - CANCER CENTER ONLY
ANION GAP: 8 (ref 3–11)
BUN: 23 mg/dL (ref 7–26)
CHLORIDE: 107 mmol/L (ref 98–109)
CO2: 27 mmol/L (ref 22–29)
Calcium: 8.8 mg/dL (ref 8.4–10.4)
Creatinine: 1.25 mg/dL (ref 0.70–1.30)
GFR, Est AFR Am: 57 mL/min — ABNORMAL LOW (ref >60)
GFR, Estimated: 49 mL/min — ABNORMAL LOW (ref >60)
GLUCOSE: 103 mg/dL (ref 70–140)
POTASSIUM: 4.6 mmol/L (ref 3.5–5.1)
Sodium: 142 mmol/L (ref 136–145)

## 2017-04-02 LAB — CBC WITH DIFFERENTIAL (CANCER CENTER ONLY)
Basophils Absolute: 0 10*3/uL (ref 0.0–0.1)
Basophils Relative: 1 %
EOS ABS: 0.3 10*3/uL (ref 0.0–0.5)
Eosinophils Relative: 10 %
HCT: 26.3 % — ABNORMAL LOW (ref 38.4–49.9)
HEMOGLOBIN: 8.2 g/dL — AB (ref 13.0–17.1)
LYMPHS ABS: 0.7 10*3/uL — AB (ref 0.9–3.3)
LYMPHS PCT: 21 %
MCH: 23.2 pg — AB (ref 27.2–33.4)
MCHC: 31.2 g/dL — ABNORMAL LOW (ref 32.0–36.0)
MCV: 74.5 fL — AB (ref 79.3–98.0)
MONOS PCT: 7 %
Monocytes Absolute: 0.3 10*3/uL (ref 0.1–0.9)
NEUTROS ABS: 2.1 10*3/uL (ref 1.5–6.5)
NEUTROS PCT: 61 %
NRBC: 1 /100{WBCs} — AB
Platelet Count: 51 10*3/uL — ABNORMAL LOW (ref 140–400)
RBC: 3.53 MIL/uL — ABNORMAL LOW (ref 4.20–5.82)
RDW: 18.5 % — ABNORMAL HIGH (ref 11.0–14.6)
WBC Count: 3.4 10*3/uL — ABNORMAL LOW (ref 4.0–10.3)

## 2017-04-02 MED ORDER — LENALIDOMIDE 10 MG PO CAPS: 10.0000 mg | ORAL_CAPSULE | Freq: Every day | ORAL | 0 refills | Status: DC

## 2017-04-02 NOTE — Addendum Note (Signed)
Addended by: Brien Few on: 04/02/2017 04:32 PM   Modules accepted: Orders

## 2017-04-02 NOTE — Progress Notes (Signed)
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  He is completing another cycle of Revlimid.  He reports 2 days remain in this cycle.  No bleeding.  He reports improvement in his respiratory status.  No new complaint.  Objective:  Vital signs in last 24 hours:  Blood pressure (!) 135/94, pulse 61, temperature 97.8 F (36.6 C), temperature source Oral, resp. rate 17, height 6' (1.829 m), weight 148 lb 1.6 oz (67.2 kg).    HEENT: No thrush or bleeding Resp: Distant breath sounds, inspiratory rhonchi at the left posterior chest, no respiratory distress Cardio: Regular rate and rhythm GI: No hepatosplenomegaly, nontender Vascular: No leg edema  Lab Results:  Hemabate 8.2, platelets 51,000, ANC 2.1 CMP     Component Value Date/Time   NA 141 03/02/2017 1524   NA 140 01/12/2017 1538   K 4.1 03/02/2017 1524   K 4.4 01/12/2017 1538   CL 105 03/02/2017 1524   CO2 28 03/02/2017 1524   CO2 27 01/12/2017 1538   GLUCOSE 90 03/02/2017 1524   GLUCOSE 84 01/12/2017 1538   BUN 24 03/02/2017 1524   BUN 23.7 01/12/2017 1538   CREATININE 1.25 03/02/2017 1524   CREATININE 1.0 01/12/2017 1538   CALCIUM 8.6 03/02/2017 1524   CALCIUM 8.7 01/12/2017 1538   PROT 5.9 (L) 03/02/2017 1524   PROT 5.7 (L) 01/12/2017 1538   PROT 6.1 (L) 01/12/2017 1538   ALBUMIN 3.4 (L) 03/02/2017 1524   ALBUMIN 2.9 (L) 01/12/2017 1538   AST 11 03/02/2017 1524   AST 11 01/12/2017 1538   ALT 9 03/02/2017 1524   ALT 12 01/12/2017 1538   ALKPHOS 58 03/02/2017 1524   ALKPHOS 49 01/12/2017 1538   BILITOT 0.5 03/02/2017 1524   BILITOT 0.47 01/12/2017 1538   GFRNONAA 49 (L) 03/02/2017 1524   GFRAA 57 (L) 03/02/2017 1524     Medications: I have reviewed the patient's current medications.   Assessment/Plan: 1. Pancytopenia-transfused with packed red blood cells 11/28/2016, 12/27/2016, 01/13/2017, 02/02/2017 2. History of beta  thalassemia trait 3. History of vitamin B-12 deficiency-vitamin B-12 replacement initiated 11/11/2016 4. History of a serum monoclonal IgAkappaprotein-elevated serum free kappa light chains, IgA, and M spike10/16/2018  Bone marrow biopsy 12/03/2016-consistent with multiple myeloma  Bone survey 12/06/2016-no destructive or lytic bone lesions identified.  Revlimid 21 days on/7 days off and weekly dexamethasone 12/16/2016(Decadron discontinued 12/29/2016 secondary to altered mental status)  Cycle 2 single agent Revlimid 01/14/2017(discontinued 3 days early)  Serum light chains, IgAimproved 02/02/2017  Cycle3 single agent Revlimid beginning 02/16/2017  Cycle for single agent Revlimid beginning 03/16/2017 5. COPD with acute and chronic respiratory failure/hypoxemia 6. History of coronary artery disease 7. Ischemic cardiomyopathy 8. ICD in place 9. Prostatic hypertrophy 10.Inappropriately low erythropoietin level 11/28/2016 11. Severe pancytopenia secondary to multiple myeloma and Revlimid 12. 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  He will complete the current cycle of Revlimid over the next few days.  We will follow-up on the light chains, serum M spike, and IgA levels from today.  He will return for a CBC prior to beginning the next cycle of Revlimid 04/13/2017.  The progressive thrombocytopenia is likely related to Revlimid in the setting of myeloma.  We will ask his cardiologist to consider discontinuing aspirin and/or Plavix if the platelet count is lower.  He will return for an office  and lab visit 05/07/2017.  15 minutes were spent with the patient today.  The majority of the time was used for counseling and coordination of care.  [name], MD  04/02/2017  3:27 PM

## 2017-04-02 NOTE — Telephone Encounter (Signed)
Appointments scheduled AVS/Calendar printed per 3/7 los °

## 2017-04-03 LAB — IGA: IgA: 323 mg/dL (ref 61–437)

## 2017-04-03 LAB — KAPPA/LAMBDA LIGHT CHAINS
KAPPA FREE LGHT CHN: 188.4 mg/L — AB (ref 3.3–19.4)
KAPPA, LAMDA LIGHT CHAIN RATIO: 7.33 — AB (ref 0.26–1.65)
LAMDA FREE LIGHT CHAINS: 25.7 mg/L (ref 5.7–26.3)

## 2017-04-06 ENCOUNTER — Ambulatory Visit: Payer: Medicare Other | Admitting: Pulmonary Disease

## 2017-04-06 ENCOUNTER — Encounter: Payer: Self-pay | Admitting: Adult Health

## 2017-04-06 ENCOUNTER — Ambulatory Visit (INDEPENDENT_AMBULATORY_CARE_PROVIDER_SITE_OTHER): Payer: Medicare Other | Admitting: Adult Health

## 2017-04-06 DIAGNOSIS — I255 Ischemic cardiomyopathy: Secondary | ICD-10-CM | POA: Diagnosis not present

## 2017-04-06 DIAGNOSIS — J9611 Chronic respiratory failure with hypoxia: Secondary | ICD-10-CM | POA: Diagnosis not present

## 2017-04-06 DIAGNOSIS — J441 Chronic obstructive pulmonary disease with (acute) exacerbation: Secondary | ICD-10-CM

## 2017-04-06 LAB — PROTEIN ELECTROPHORESIS, SERUM
A/G RATIO SPE: 1.7 (ref 0.7–1.7)
ALBUMIN ELP: 3.4 g/dL (ref 2.9–4.4)
ALPHA-1-GLOBULIN: 0.2 g/dL (ref 0.0–0.4)
Alpha-2-Globulin: 0.6 g/dL (ref 0.4–1.0)
Beta Globulin: 0.9 g/dL (ref 0.7–1.3)
GLOBULIN, TOTAL: 2 g/dL — AB (ref 2.2–3.9)
Gamma Globulin: 0.3 g/dL — ABNORMAL LOW (ref 0.4–1.8)
M-Spike, %: 0.2 g/dL — ABNORMAL HIGH
TOTAL PROTEIN ELP: 5.4 g/dL — AB (ref 6.0–8.5)

## 2017-04-06 NOTE — Patient Instructions (Signed)
Continue on Brovana and Pulmicort neb Twice daily .  Albuterol neb every 4hr as needed -this is your rescue .  Albuterol Inhaler 2 puffs every 4hrs as needed -rescue inhaler .  Continue Oxygen Oxygen 2l/m.  Follow up with Dr. Elsworth Soho In 4-6  months and As needed   Please contact office for sooner follow up if symptoms do not improve or worsen or seek emergency care

## 2017-04-06 NOTE — Progress Notes (Signed)
@Patient  ID: [name] Azure, MD, male    DOB: 05-06-1925, 84 y.o.   MRN: [id number]  Chief Complaint  Patient presents with  . Follow-up    COPD    Referring provider: [name], MD  HPI: 84 year old male, previous dentist, former smoker followed for Asthma/COPD and O2 RF  Hx Diastolic CHF Chronic urinary retention -self caths   TEST  PFT's 04/16/11 FEV1 1.57 (60%) ratio 71 and 12% better p B2, dlco 41 corrects to 58  04/06/2017 Follow up : COPD , O2 RF  Patient returns for a 66-month follow-up patient has underlying COPD and asthma. Says overall his breathing is doing okay , gets winded with activity . Is very sedentary , uses a scooter.  Remains on pulmicort and Brovana neb Twice daily  . Denies flare of cough or wheezing.  Remains on oxygen at 2 L. At bedtime  . Does not wear much with activity .   Uses ensure , appetite remains low.    No Known Allergies  Immunization History  Administered Date(s) Administered  . Influenza Split 10/28/2010, 10/27/2012  . Influenza Whole 12/08/2011  . Influenza, High Dose Seasonal PF 10/16/2016  . Pneumococcal Conjugate-13 05/26/2013  . Pneumococcal Polysaccharide-23 10/31/1998    Past Medical History:  Diagnosis Date  . Anemia, unspecified   . CAD (coronary artery disease)   . Cataracts, bilateral   . CHF (congestive heart failure) ([place])   . Chronic airway obstruction, not elsewhere classified    on o2 at night  . Diabetes mellitus   . Diverticulosis of colon (without mention of hemorrhage)   . Dyslipidemia   . Emphysema   . Hemorrhoids   . ICD (implantable cardiac defibrillator) in place    st jude  . Ischemic cardiomyopathy   . Macular degeneration   . MI, old 2010 or 2011  . Prostatic hypertrophy    hx of  . Retention of urine, unspecified   . Unspecified disorder resulting from impaired renal function     Tobacco History: Social History   Tobacco Use  Smoking Status Former Smoker  . Types:  Cigarettes  . Last attempt to quit: 04/27/2009  . Years since quitting: 7.9  Smokeless Tobacco Never Used   Counseling given: Not Answered   Outpatient Encounter Medications as of 04/06/2017  Medication Sig  . albuterol (PROAIR HFA) 108 (90 Base) MCG/ACT inhaler Inhale 2 puffs into the lungs every 4 (four) hours as needed for wheezing or shortness of breath.  Marland Kitchen albuterol (PROVENTIL) (2.5 MG/3ML) 0.083% nebulizer solution Take 3 mLs (2.5 mg total) every 2 (two) hours as needed by nebulization for wheezing or shortness of breath.  . ALPRAZolam (XANAX) 0.25 MG tablet Take 1 tablet (0.25 mg total) by mouth at bedtime as needed for sleep.  Marland Kitchen amoxicillin (AMOXIL) 500 MG capsule Take 500 mg by mouth.  Marland Kitchen arformoterol (BROVANA) 15 MCG/2ML NEBU Take 15 mcg by nebulization 2 (two) times daily.  Marland Kitchen aspirin EC 81 MG tablet Take 1 tablet (81 mg total) by mouth at bedtime.  . bisacodyl (DULCOLAX) 5 MG EC tablet Take 5 mg by mouth daily as needed for moderate constipation.  . budesonide (PULMICORT) 0.5 MG/2ML nebulizer solution Take 2 mLs (0.5 mg total) by nebulization 2 (two) times daily.  . clopidogrel (PLAVIX) 75 MG tablet Take 75 mg by mouth daily.  . ferrous sulfate 325 (65 FE) MG tablet Take 1 tablet (325 mg total) by mouth 2 (two) times daily with a meal.  .  fluticasone (FLONASE) 50 MCG/ACT nasal spray Place 1 spray into both nostrils daily. (Patient taking differently: Place 1 spray into both nostrils daily as needed for allergies. )  . guaiFENesin (MUCINEX) 600 MG 12 hr tablet Take 600 mg by mouth 2 (two) times daily as needed for cough or to loosen phlegm.   Marland Kitchen lenalidomide (REVLIMID) 10 MG capsule Take 1 capsule (10 mg total) by mouth daily. Take with water for 21 days on, 7 days off, repeat every 28 days.  Marland Kitchen loratadine (CLARITIN) 10 MG tablet Take 1 tablet (10 mg total) by mouth daily.  . metoprolol tartrate (LOPRESSOR) 25 MG tablet Take 12.5 mg by mouth 2 (two) times daily. Reported on 06/06/2015  .  nitroGLYCERIN (NITROSTAT) 0.4 MG SL tablet Place 1 tablet (0.4 mg total) under the tongue every 5 (five) minutes as needed for chest pain. UP TO 3 DOSES THEN CALL EMS  . OXYGEN Inhale 2 L into the lungs at bedtime.   . pantoprazole (PROTONIX) 40 MG tablet Take 1 tablet (40 mg total) by mouth daily.  . rosuvastatin (CRESTOR) 5 MG tablet Take 1 tablet (5 mg total) by mouth daily at 6 PM. (Patient taking differently: Take 5 mg by mouth at bedtime. )  . sodium chloride (OCEAN) 0.65 % SOLN nasal spray Place 2 sprays into both nostrils 2 (two) times daily.  Marland Kitchen trimethoprim (TRIMPEX) 100 MG tablet Take 1 tablet (100 mg total) daily by mouth.  . vitamin B-12 1000 MCG tablet Take 1 tablet (1,000 mcg total) daily by mouth.  . vitamin C (VITAMIN C) 250 MG tablet Take 1 tablet (250 mg total) by mouth 2 (two) times daily.  . [DISCONTINUED] predniSONE (DELTASONE) 20 MG tablet Take 3 tablets by mouth daily times 2 days; then 2 tablets by mouth daily times 2 days; then 1 tablet by mouth daily times 3 days; then 1/2 tablet by mouth daily times 3 days and stop prednisone. (Patient not taking: Reported on 04/06/2017)   Facility-Administered Encounter Medications as of 04/06/2017  Medication  . 0.9 %  sodium chloride infusion     Review of Systems  Constitutional:   No  weight loss, night sweats,  Fevers, chills, + fatigue, or  lassitude.  HEENT:   No headaches,  Difficulty swallowing,  Tooth/dental problems, or  Sore throat,                No sneezing, itching, ear ache, nasal congestion, post nasal drip,   CV:  No chest pain,  Orthopnea, PND, swelling in lower extremities, anasarca, dizziness, palpitations, syncope.   GI  No heartburn, indigestion, abdominal pain, nausea, vomiting, diarrhea, change in bowel habits, loss of appetite, bloody stools.   Resp: No   No chest wall deformity  Skin: no rash or lesions.  GU: no dysuria, change in color of urine, no urgency or frequency.  No flank pain, no hematuria    MS:  No joint pain or swelling.  No decreased range of motion.  No back pain.    Physical Exam  BP 104/76 (BP Location: Left Arm, Cuff Size: Normal)   Pulse 75   Ht 6' (1.829 m)   Wt 149 lb (67.6 kg)   SpO2 95%   BMI 20.21 kg/m   GEN: A/Ox3; pleasant , NAD, elderly in scooter    HEENT:  [place]/AT,  EACs-clear, TMs-wnl, NOSE-clear, THROAT-clear, no lesions, no postnasal drip or exudate noted.   NECK:  Supple w/ fair ROM; no JVD; normal carotid impulses  w/o bruits; no thyromegaly or nodules palpated; no lymphadenopathy.    RESP  Clear  P & A; w/o, wheezes/ rales/ or rhonchi. no accessory muscle use, no dullness to percussion  CARD:  RRR, no m/r/g, no peripheral edema, pulses intact, no cyanosis or clubbing.  GI:   Soft & nt; nml bowel sounds; no organomegaly or masses detected.   Musco: Warm bil, no deformities or joint swelling noted.   Neuro: alert, no focal deficits noted.    Skin: Warm, no lesions or rashes    Lab Results:  CBC  BMET  BNP  Imaging: No results found.   Assessment & Plan:   COPD with acute exacerbation ([place]) Stable without flare   Plan Patient Instructions  Continue on Brovana and Pulmicort neb Twice daily .  Albuterol neb every 4hr as needed -this is your rescue .  Albuterol Inhaler 2 puffs every 4hrs as needed -rescue inhaler .  Continue Oxygen Oxygen 2l/m.  Follow up with Dr. Elsworth Soho In 4-6  months and As needed   Please contact office for sooner follow up if symptoms do not improve or worsen or seek emergency care        Chronic respiratory failure with hypoxia ([place]) Stable on O2 .      Rexene Edison, NP 04/06/2017

## 2017-04-06 NOTE — Assessment & Plan Note (Signed)
Stable without flare   Plan Patient Instructions  Continue on Brovana and Pulmicort neb Twice daily .  Albuterol neb every 4hr as needed -this is your rescue .  Albuterol Inhaler 2 puffs every 4hrs as needed -rescue inhaler .  Continue Oxygen Oxygen 2l/m.  Follow up with Dr. Elsworth Soho In 4-6  months and As needed   Please contact office for sooner follow up if symptoms do not improve or worsen or seek emergency care

## 2017-04-06 NOTE — Assessment & Plan Note (Signed)
Stable on O2 

## 2017-04-07 ENCOUNTER — Telehealth: Payer: Self-pay

## 2017-04-07 NOTE — Telephone Encounter (Addendum)
lVM to call office back   ----- Message from Ladell Pier, MD sent at 04/06/2017 11:05 PM EDT ----- Please call patient, myeloma labs are stable, plan to continue revlimid as scheduled

## 2017-04-08 ENCOUNTER — Telehealth: Payer: Self-pay

## 2017-04-08 NOTE — Telephone Encounter (Addendum)
Daughter voiced undrstanding of message below ----- Message from Ladell Pier, MD sent at 04/06/2017 11:05 PM EDT ----- Please call patient, myeloma labs are stable, plan to continue revlimid as scheduled

## 2017-04-13 ENCOUNTER — Inpatient Hospital Stay: Payer: Medicare Other

## 2017-04-13 DIAGNOSIS — R410 Disorientation, unspecified: Secondary | ICD-10-CM | POA: Diagnosis not present

## 2017-04-13 DIAGNOSIS — E538 Deficiency of other specified B group vitamins: Secondary | ICD-10-CM | POA: Diagnosis not present

## 2017-04-13 DIAGNOSIS — D61818 Other pancytopenia: Secondary | ICD-10-CM | POA: Diagnosis not present

## 2017-04-13 DIAGNOSIS — C9000 Multiple myeloma not having achieved remission: Secondary | ICD-10-CM | POA: Diagnosis not present

## 2017-04-13 LAB — CBC WITH DIFFERENTIAL (CANCER CENTER ONLY)
BASOS PCT: 1 %
Basophils Absolute: 0 10*3/uL (ref 0.0–0.1)
EOS ABS: 0.3 10*3/uL (ref 0.0–0.5)
Eosinophils Relative: 11 %
HCT: 25.1 % — ABNORMAL LOW (ref 38.4–49.9)
Hemoglobin: 7.8 g/dL — ABNORMAL LOW (ref 13.0–17.1)
Lymphocytes Relative: 23 %
Lymphs Abs: 0.6 10*3/uL — ABNORMAL LOW (ref 0.9–3.3)
MCH: 23 pg — AB (ref 27.2–33.4)
MCHC: 31.1 g/dL — AB (ref 32.0–36.0)
MCV: 74 fL — ABNORMAL LOW (ref 79.3–98.0)
MONO ABS: 0.3 10*3/uL (ref 0.1–0.9)
MONOS PCT: 11 %
NEUTROS PCT: 54 %
NRBC: 1 /100{WBCs} — AB
Neutro Abs: 1.5 10*3/uL (ref 1.5–6.5)
PLATELETS: 59 10*3/uL — AB (ref 140–400)
RBC: 3.39 MIL/uL — ABNORMAL LOW (ref 4.20–5.82)
RDW: 18.7 % — ABNORMAL HIGH (ref 11.0–14.6)
WBC Count: 2.7 10*3/uL — ABNORMAL LOW (ref 4.0–10.3)

## 2017-04-14 ENCOUNTER — Telehealth: Payer: Self-pay | Admitting: *Deleted

## 2017-04-14 NOTE — Telephone Encounter (Signed)
Per 3/19 Sch IB from Rosalio Macadamia, RN, patient scheduled for 04/27/17 2p lab only appointment.  VM left w/ pts daughter w/ request to call back to confirm receipt of appt date/time.

## 2017-04-14 NOTE — Telephone Encounter (Signed)
Call from pt's daughter, informed her of lab results and Dr. Gearldine Shown instructions. She voiced understanding.

## 2017-04-14 NOTE — Telephone Encounter (Signed)
Left detailed message on daughter's voicemail with instructions to begin Revlimid. Call office for bleeding or dyspnea, repeat lab 4/1. Message to schedulers.

## 2017-04-14 NOTE — Telephone Encounter (Signed)
-----   Message from Ladell Pier, MD sent at 04/14/2017  7:44 AM EDT ----- Please call patient, ok to begin revlimid, call for bleeding or dyspnea, repeat cbc 4/1

## 2017-04-19 NOTE — Progress Notes (Deleted)
Cardiology Office Note Date:  04/19/2017  Patient ID:  [name] Azure, [name] [name], DOB 01/03/1926, MRN [id number] PCP:  [name], [name] [name]  Cardiologist:  Dr. Johnsie Cancel Electrophysiologist: Dr. Johnsie Cancel Oncology: Dr. [name] Pulmonology: T. Royal Piedra, NP ([facility])  ***refresh   Chief Complaint: annual EP visit  History of Present Illness: [name] Sheller, [name] [name] is a 84 y.o. male with history of CAD (PCI w/DES 2011), ICM, chronic CHF (systolic), hx of cardiac arrest/VF w/ICD (2011), HTN, HLD, COPD on chronic O2, CKD, multiple myeloma.  He comes in today to be seen for Dr. Rayann Heman.  He last saw him Feb 2018, at that time discussed advisory on his device, vibratory alert was demonstrated, reminded to continue remote monitoring/discussed importance and in discussion, planned to follow with conservative management.  He was doing well, no changes were made.  Most recently for cardiology saw Dr. Johnsie Cancel, Jun 2018, noted urologic issues with hx of sepsis/bacteremia associated with complicated uti, nephritis, requiring self cathing.  Discussed PRN lasix to use w/cautio given CKD.  Followed [place] > [facility], RN.  Jan 2019 was hospitalized with RSV respiratory infection/sepsis.  *** [name] [name] *** fluid status *** symptoms *** meds, CAD, CM ***labs? *** no statin??  Device information SJM dual chamber ICD, implanted 2011, Dr. Rayann Heman On advisory: [facility] [employer] [phone] [address] DR  Past Medical History:  Diagnosis Date  . Anemia, unspecified   . CAD (coronary artery disease)   . Cataracts, bilateral   . CHF (congestive heart failure) ([place])   . Chronic airway obstruction, not elsewhere classified    on o2 at night  . Diabetes mellitus   . Diverticulosis of colon (without mention of hemorrhage)   . Dyslipidemia   . Emphysema   . Hemorrhoids   . ICD (implantable cardiac defibrillator) in place    st jude  . Ischemic cardiomyopathy   . Macular degeneration   . MI, old 2010 or 2011  . Prostatic  hypertrophy    hx of  . Retention of urine, unspecified   . Unspecified disorder resulting from impaired renal function     Past Surgical History:  Procedure Laterality Date  . CARDIAC DEFIBRILLATOR PLACEMENT  2011   [facility]  . CATARACT EXTRACTION    . PACEMAKER INSERTION  2011  . pci     4/11  . TONSILLECTOMY  84 yo    Current Outpatient Medications  Medication Sig Dispense Refill  . albuterol (PROAIR HFA) 108 (90 Base) MCG/ACT inhaler Inhale 2 puffs into the lungs every 4 (four) hours as needed for wheezing or shortness of breath. 1 Inhaler 3  . albuterol (PROVENTIL) (2.5 MG/3ML) 0.083% nebulizer solution Take 3 mLs (2.5 mg total) every 2 (two) hours as needed by nebulization for wheezing or shortness of breath. 75 mL 0  . ALPRAZolam (XANAX) 0.25 MG tablet Take 1 tablet (0.25 mg total) by mouth at bedtime as needed for sleep. 30 tablet 0  . amoxicillin (AMOXIL) 500 MG capsule Take 500 mg by mouth.    Marland Kitchen arformoterol (BROVANA) 15 MCG/2ML NEBU Take 15 mcg by nebulization 2 (two) times daily.    Marland Kitchen aspirin EC 81 MG tablet Take 1 tablet (81 mg total) by mouth at bedtime. 30 tablet 0  . bisacodyl (DULCOLAX) 5 MG EC tablet Take 5 mg by mouth daily as needed for moderate constipation.    . budesonide (PULMICORT) 0.5 MG/2ML nebulizer solution Take 2 mLs (0.5 mg total) by nebulization 2 (two) times daily. 120 mL 3  .  clopidogrel (PLAVIX) 75 MG tablet Take 75 mg by mouth daily.    . ferrous sulfate 325 (65 FE) MG tablet Take 1 tablet (325 mg total) by mouth 2 (two) times daily with a meal. 60 tablet 0  . fluticasone (FLONASE) 50 MCG/ACT nasal spray Place 1 spray into both nostrils daily. (Patient taking differently: Place 1 spray into both nostrils daily as needed for allergies. ) 16 g 2  . guaiFENesin (MUCINEX) 600 MG 12 hr tablet Take 600 mg by mouth 2 (two) times daily as needed for cough or to loosen phlegm.     Marland Kitchen lenalidomide (REVLIMID) 10 MG capsule Take 1 capsule (10 mg total) by mouth  daily. Take with water for 21 days on, 7 days off, repeat every 28 days. 21 capsule 0  . loratadine (CLARITIN) 10 MG tablet Take 1 tablet (10 mg total) by mouth daily. 30 tablet 1  . metoprolol tartrate (LOPRESSOR) 25 MG tablet Take 12.5 mg by mouth 2 (two) times daily. Reported on 06/06/2015    . nitroGLYCERIN (NITROSTAT) 0.4 MG SL tablet Place 1 tablet (0.4 mg total) under the tongue every 5 (five) minutes as needed for chest pain. UP TO 3 DOSES THEN CALL EMS 30 tablet 1  . OXYGEN Inhale 2 L into the lungs at bedtime.     . pantoprazole (PROTONIX) 40 MG tablet Take 1 tablet (40 mg total) by mouth daily. 30 tablet 0  . rosuvastatin (CRESTOR) 5 MG tablet Take 1 tablet (5 mg total) by mouth daily at 6 PM. (Patient taking differently: Take 5 mg by mouth at bedtime. ) 90 tablet 3  . sodium chloride (OCEAN) 0.65 % SOLN nasal spray Place 2 sprays into both nostrils 2 (two) times daily.  0  . trimethoprim (TRIMPEX) 100 MG tablet Take 1 tablet (100 mg total) daily by mouth.    . vitamin B-12 1000 MCG tablet Take 1 tablet (1,000 mcg total) daily by mouth.    . vitamin C (VITAMIN C) 250 MG tablet Take 1 tablet (250 mg total) by mouth 2 (two) times daily. 60 tablet 0   No current facility-administered medications for this visit.    Facility-Administered Medications Ordered in Other Visits  Medication Dose Route Frequency Provider Last Rate Last Dose  . 0.9 %  sodium chloride infusion  250 mL Intravenous Once Ladell Pier, [name] [name]        Allergies:   Patient has no known allergies.   Social History:  The patient  reports that he quit smoking about 7 years ago. His smoking use included cigarettes. He has never used smokeless tobacco. He reports that he drinks alcohol. He reports that he does not use drugs.   Family History:  The patient's family history includes Atrial fibrillation in his daughter; Breast cancer in his sister; Congestive Heart Failure in his mother; Coronary artery disease in his mother;  Heart attack in his brother; Lung cancer in his father; Stroke in his mother.***  ROS:  Please see the history of present illness. Otherwise, review of systems is positive for ***.   All other systems are reviewed and otherwise negative.   PHYSICAL EXAM: *** VS:  There were no vitals taken for this visit. BMI: There is no height or weight on file to calculate BMI. Well nourished, well developed, in no acute distress  HEENT: normocephalic, atraumatic  Neck: no JVD, carotid bruits or masses Cardiac:  *** RRR; no significant murmurs, no rubs, or gallops Lungs:  *** CTA  b/l, no wheezing, rhonchi or rales  Abd: soft, nontender MS: no deformity or *** atrophy Ext: *** no edema  Skin: warm and dry, no rash Neuro:  No gross deficits appreciated Psych: euthymic mood, full affect  *** ICD site is stable, no tethering or discomfort   EKG:  Done today shows ***  06/06/15: TTE Study Conclusions - Left ventricle: The cavity size was normal. Wall thickness was   increased in a pattern of mild LVH. Systolic function was normal.   The estimated ejection fraction was in the range of 55% to 60%.   Wall motion was normal; there were no regional wall motion   abnormalities. Doppler parameters are consistent with abnormal   left ventricular relaxation (grade 1 diastolic dysfunction). The   E/e&' ratio is between 8-15, suggesting indeterminate LV filling   pressure. - Aortic valve: Mildly calcified leaflets. Mild stenosis. There was   no regurgitation. Mean gradient (S): 12 mm Hg. Peak gradient (S):   24 mm Hg. Valve area (VTI): 1.78 cm^2. Valve area (Vmax): 1.73   cm^2. Valve area (Vmean): 1.75 cm^2. - Mitral valve: Calcified annulus. Mildly thickened leaflets .   There was mild regurgitation. - Left atrium: The atrium was normal in size. - Right ventricle: The cavity size was normal. Pacer wire or   catheter noted in right ventricle. Systolic function was normal. - Right atrium: The atrium was  normal in size. Pacer wire or   catheter noted in right atrium. - Inferior vena cava: The vessel was normal in size. The   respirophasic diameter changes were in the normal range (>= 50%),   consistent with normal central venous pressure. Impressions: - Compared to a prior echo in 2015, the LVEF has improved to   55-60%. AICD wires are present. There is mild aortic stenosis   with AVA around 1.7-1.8 cm2.   LHC in 2011: PCI/DES to RI, dLCx and RCA.   2D ECHO: 11/15/2013 LV EF: 45% -  50% Study Conclusions - Left ventricle: Distal septal and apical hypokinesis. The cavity size was mildly dilated. Systolic function was mildly reduced. The estimated ejection fraction was in the range of 45% to 50%. - Aortic valve: Small systolic gradient without significant stenosis Valve area (VTI): 1.8 cm^2. Valve area (Vmax): 1.72 cm^2. - Mitral valve: Calcified annulus. Mildly thickened leaflets . - Left atrium: The atrium was mildly dilated. - Atrial septum: No defect or patent foramen ovale was identified. - Pulmonary arteries: PA peak pressure: 40 mm Hg (S).    Recent Labs: 11/12/2016: Magnesium 2.1 11/27/2016: B Natriuretic Peptide 124.2 02/12/2017: Hemoglobin 9.4 03/02/2017: ALT 9 04/02/2017: BUN 23; Creatinine 1.25; Potassium 4.6; Sodium 142 04/13/2017: Platelet Count 59  No results found for requested labs within last 8760 hours.   Estimated Creatinine Clearance: 38.3 mL/min (by C-G formula based on SCr of 1.25 mg/dL).   Wt Readings from Last 3 Encounters:  04/06/17 149 lb (67.6 kg)  04/02/17 148 lb 1.6 oz (67.2 kg)  03/02/17 149 lb 9.6 oz (67.9 kg)     Other studies reviewed: Additional studies/records reviewed today include: summarized above  ASSESSMENT AND PLAN:  1. ICD     ***  2. ICM w/improved LVEF by last echo in 2017     ***  3. CAD     ***     On ASA, plavix, BB  4. HTN     ***    Disposition: F/u with ***  Current medicines are reviewed at  length  with the patient today.  The patient did not have any concerns regarding medicines.***  Signed, Tommye Standard, PA-C 04/19/2017 2:32 PM     [facility] [address] [place] [address] [phone] (office)  [phone] (fax)

## 2017-04-20 ENCOUNTER — Inpatient Hospital Stay: Payer: Medicare Other

## 2017-04-20 ENCOUNTER — Telehealth: Payer: Self-pay

## 2017-04-20 ENCOUNTER — Inpatient Hospital Stay (HOSPITAL_BASED_OUTPATIENT_CLINIC_OR_DEPARTMENT_OTHER): Payer: Medicare Other | Admitting: Medical

## 2017-04-20 ENCOUNTER — Telehealth: Payer: Self-pay | Admitting: Medical

## 2017-04-20 VITALS — BP 137/51 | HR 61 | Temp 98.0°F | Resp 16 | Ht 72.0 in | Wt 149.2 lb

## 2017-04-20 DIAGNOSIS — R410 Disorientation, unspecified: Secondary | ICD-10-CM | POA: Diagnosis not present

## 2017-04-20 DIAGNOSIS — C9000 Multiple myeloma not having achieved remission: Secondary | ICD-10-CM

## 2017-04-20 DIAGNOSIS — D61818 Other pancytopenia: Secondary | ICD-10-CM

## 2017-04-20 DIAGNOSIS — D649 Anemia, unspecified: Secondary | ICD-10-CM

## 2017-04-20 DIAGNOSIS — E538 Deficiency of other specified B group vitamins: Secondary | ICD-10-CM | POA: Diagnosis not present

## 2017-04-20 LAB — CBC WITH DIFFERENTIAL (CANCER CENTER ONLY)
BASOS ABS: 0 10*3/uL (ref 0.0–0.1)
BASOS PCT: 1 %
EOS ABS: 0.3 10*3/uL (ref 0.0–0.5)
EOS PCT: 9 %
HCT: 25.1 % — ABNORMAL LOW (ref 38.4–49.9)
Hemoglobin: 7.8 g/dL — ABNORMAL LOW (ref 13.0–17.1)
LYMPHS PCT: 27 %
Lymphs Abs: 0.9 10*3/uL (ref 0.9–3.3)
MCH: 22.9 pg — ABNORMAL LOW (ref 27.2–33.4)
MCHC: 31.1 g/dL — ABNORMAL LOW (ref 32.0–36.0)
MCV: 73.8 fL — ABNORMAL LOW (ref 79.3–98.0)
MONO ABS: 0.2 10*3/uL (ref 0.1–0.9)
Monocytes Relative: 5 %
Neutro Abs: 1.9 10*3/uL (ref 1.5–6.5)
Neutrophils Relative %: 58 %
PLATELETS: 46 10*3/uL — AB (ref 140–400)
RBC: 3.4 MIL/uL — AB (ref 4.20–5.82)
RDW: 19 % — AB (ref 11.0–14.6)
WBC: 3.3 10*3/uL — AB (ref 4.0–10.3)

## 2017-04-20 LAB — CMP (CANCER CENTER ONLY)
ALT: 8 U/L (ref 0–55)
AST: 9 U/L (ref 5–34)
Albumin: 3.2 g/dL — ABNORMAL LOW (ref 3.5–5.0)
Alkaline Phosphatase: 53 U/L (ref 40–150)
Anion gap: 8 (ref 3–11)
BILIRUBIN TOTAL: 0.5 mg/dL (ref 0.2–1.2)
BUN: 23 mg/dL (ref 7–26)
CALCIUM: 8.6 mg/dL (ref 8.4–10.4)
CHLORIDE: 108 mmol/L (ref 98–109)
CO2: 28 mmol/L (ref 22–29)
Creatinine: 1.16 mg/dL (ref 0.70–1.30)
GFR, EST NON AFRICAN AMERICAN: 54 mL/min — AB (ref >60)
Glucose, Bld: 96 mg/dL (ref 70–140)
Potassium: 4.2 mmol/L (ref 3.5–5.1)
Sodium: 144 mmol/L (ref 136–145)
TOTAL PROTEIN: 5.3 g/dL — AB (ref 6.4–8.3)

## 2017-04-20 LAB — SAMPLE TO BLOOD BANK

## 2017-04-20 NOTE — Telephone Encounter (Signed)
Appointment scheduled AVS/ printed per 3/25 los

## 2017-04-20 NOTE — Telephone Encounter (Signed)
Received phone call from patient daughter stating she had talked to her dad last night who complained of "being tired, loss of appetite and just not feeling right." Daughter unable to answer further questions about pt vitals and symptoms as she has not seen him in over a week. Per daughter pt can be seen after noon today 04/20/2017. This RN attempted to call patient to discuss symptoms and vitals with no answer.  Discussed phone call with Dr. [name] who stated patient needs to be seen today in symptom management. Lucianne Lei, [place] aware of pt complaints and appointment made. Pt daughter aware of appointment and stated pt will be brought in by his caregiver. Daughter aware to call with any further questions or concerns.

## 2017-04-20 NOTE — Progress Notes (Signed)
Made Beth, RN aware of Hgb 7.8 per lab.

## 2017-04-21 NOTE — Progress Notes (Signed)
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 Ladell Pier  Actively treated with chemotherapy: yes  Current Therapy: Revlimid  Last Treated: 04/02/2017 through 04/22/2017  Assessment: Plan:    Confusion - Plan: CBC with Differential ([place] Only), CMP ([place] only), CANCELED: Urinalysis, Complete w Microscopic, CANCELED: Urine Culture  Multiple myeloma not having achieved remission ([place]) - Plan: CBC with Differential ([place] Only), CMP ([place] only), CANCELED: Urinalysis, Complete w Microscopic, CANCELED: Urine Culture  Pancytopenia ([place]) - Plan: Sample to Blood Bank  Anemia, unspecified type - Plan: Practitioner attestation of consent, Complete patient signature process for consent form, Care order/instruction, Type and screen, Prepare RBC, Transfuse RBC, acetaminophen (TYLENOL) tablet 650 mg   Reported confusion: A CBC, chemistry panel, urinalysis and urine culture were ordered.  The urinalysis and urine culture were canceled however.  Multiple myeloma without remission: The patient continues on Revlimid and will currently finished his current cycle on 04/22/2017.  He will be seen in follow-up by Dr. Ladell Pier on 05/07/2017.  Pancytopenia due to multiple myeloma with anemia: A CBC returned today with a WBC of 3.3, hemoglobin 7.8, hematocrit 25.1 and platelet count of 46.  Initially 2 units of packed red blood cells were offered to [name].  He however declines 2 units but does agree to return on 04/23/2017 for 1 unit of packed red blood cells.  Please see After Visit Summary for patient specific instructions.  Future Appointments  Date Time Provider [place]  04/22/2017 12:00 PM [facility] F19 [facility] None  04/22/2017  3:15 PM Baldwin [place], PA-C [facility]  04/27/2017  2:00 PM [facility] ONLY [facility] None  05/07/2017   3:15 PM Owens Shark, NP [facility] None  05/25/2017  4:05 PM [facility] DEVICE REMOTES [facility]    Orders Placed This Encounter  Procedures  . CBC with Differential ([place] Only)  . CMP ([place] only)  . Care order/instruction  . Sample to Blood Bank  . Type and screen  . 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 (DOB 1925-08-08) male.  Chief Complaint:  Chief Complaint  Patient presents with  . Fatigue    HPI [name] Azure, MD is a 83 year old male with multiple myeloma who is managed by Dr. [name]. He continues on Revlimid and was last seen by Dr. [name] on 04/02/2017. The patient's daughter called today reporting that she had not seen her father for several days, but spoke to him this morning and found that he appeared confused.  The patient presents to the clinic today with his nephew.  He reports that he is feeling weak and tired.  He has stable shortness of breath and has a history of COPD.  He continues on Revlimid.  He will complete his current cycle of Revlimid on 04/22/2017.  He denies fevers, chills, and sweats.  Medications: I have reviewed the patient's current medications.  Allergies: No Known Allergies  Past Medical History:  Diagnosis Date  . Anemia, unspecified   . CAD (coronary artery disease)   . Cataracts, bilateral   . CHF (congestive heart failure) ([place])   . Chronic airway obstruction, not elsewhere classified    on o2 at night  . Diabetes mellitus   . Diverticulosis of colon (without mention of hemorrhage)   . Dyslipidemia   .  Emphysema   . Hemorrhoids   . ICD (implantable cardiac defibrillator) in place    st jude  . Ischemic cardiomyopathy   . Macular degeneration   . MI, old 2010 or 2011  . Prostatic hypertrophy    hx of  . Retention of urine, unspecified   . Unspecified disorder resulting from impaired renal function     Past Surgical History:  Procedure  Laterality Date  . CARDIAC DEFIBRILLATOR PLACEMENT  2011   [facility] jude  . CATARACT EXTRACTION    . PACEMAKER INSERTION  2011  . pci     4/11  . TONSILLECTOMY  84 yo    Family History  Problem Relation Age of Onset  . Coronary artery disease Mother   . Stroke Mother   . Congestive Heart Failure Mother   . Lung cancer Father        smoker  . Breast cancer Sister   . Atrial fibrillation Daughter   . Heart attack Brother     Social History   Socioeconomic History  . Marital status: Widowed    Spouse name: Not on file  . Number of children: Not on file  . Years of education: Not on file  . Highest education level: Not on file  Occupational History  . 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 Financial resource strain: Not on file  . Food insecurity:    Worry: Not on file    Inability: Not on file  . Transportation needs:    Medical: Not on file    Non-medical: Not on file  Tobacco Use  . Smoking status: Former Smoker    Types: Cigarettes    Last attempt to quit: 04/27/2009    Years since quitting: 7.9  . Smokeless tobacco: Never Used  Substance and Sexual Activity  . Alcohol use: Yes    Comment: occasional beer  . Drug use: No  . Sexual activity: Never  Lifestyle  . Physical activity:    Days per week: Not on file    Minutes per session: Not on file  . Stress: Not on file  Relationships  . Social connections:    Talks on phone: Not on file    Gets together: Not on file    Attends religious service: Not on file    Active member of club or organization: Not on file    Attends meetings of clubs or organizations: Not on file    Relationship status: Not on file  . Intimate partner violence:    Fear of current or ex partner: Not on file    Emotionally abused: Not on file    Physically abused: Not on file    Forced sexual activity: Not on file  Other Topics Concern  . Not on file  Social History Narrative   Lives in a house with stairs,  which he needs to use, with his wife.  Wife is not able-bodied.  Does not use a cane or walker, but is unsteady on his feet.        [name] Schleich:  [phone] cell, [phone], daughter, POA.     Boyd Kerbs:  [phone], nephew   Juluis Pitch:  [phone], daughter          Past Medical History, Surgical history, Social history, and Family history were reviewed and updated as appropriate.   Please see review of systems for further details on the patient's review from today.   Review of  Systems:  Review of Systems  Constitutional: Positive for fatigue. Negative for chills, diaphoresis and fever.  Respiratory: Positive for shortness of breath. Negative for cough, choking, chest tightness and wheezing.   Cardiovascular: Negative for chest pain and palpitations.  Neurological: Positive for weakness. Negative for dizziness, facial asymmetry and speech difficulty.    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  The patient is an elderly male who is ambulating with the use of an electric scooter.  HENT:  Head: Normocephalic and atraumatic.  Eyes: Right eye exhibits no discharge. Left eye exhibits no discharge.  Sclerae pale bilaterally.  Cardiovascular: Normal rate, regular rhythm and normal heart sounds. Exam reveals no gallop and no friction rub.  No murmur heard. Pulmonary/Chest: No respiratory distress.  Bibasilar crackles are noted.  Neurological: He is alert. Coordination (Patient is ambulating with the use of an electric scooter.) abnormal.  Skin: Skin is warm and dry. He is not diaphoretic. There is pallor.    Lab Review:     Component Value Date/Time   NA 144 04/20/2017 1236   NA 140 01/12/2017  1538   K 4.2 04/20/2017 1236   K 4.4 01/12/2017 1538   CL 108 04/20/2017 1236   CO2 28 04/20/2017 1236   CO2 27 01/12/2017 1538   GLUCOSE 96 04/20/2017 1236   GLUCOSE 84 01/12/2017 1538   BUN 23 04/20/2017 1236   BUN 23.7 01/12/2017 1538   CREATININE 1.16 04/20/2017 1236   CREATININE 1.0 01/12/2017 1538   CALCIUM 8.6 04/20/2017 1236   CALCIUM 8.7 01/12/2017 1538   PROT 5.3 (L) 04/20/2017 1236   PROT 5.7 (L) 01/12/2017 1538   PROT 6.1 (L) 01/12/2017 1538   ALBUMIN 3.2 (L) 04/20/2017 1236   ALBUMIN 2.9 (L) 01/12/2017 1538   AST 9 04/20/2017 1236   AST 11 01/12/2017 1538   ALT 8 04/20/2017 1236   ALT 12 01/12/2017 1538   ALKPHOS 53 04/20/2017 1236   ALKPHOS 49 01/12/2017 1538   BILITOT 0.5 04/20/2017 1236   BILITOT 0.47 01/12/2017 1538   GFRNONAA 54 (L) 04/20/2017 1236   GFRAA >60 04/20/2017 1236       Component Value Date/Time   WBC 3.3 (L) 04/20/2017 1236   WBC 2.8 (L) 02/12/2017 1521   RBC 3.40 (L) 04/20/2017 1236   HGB 9.4 (L) 02/12/2017 1521   HGB 7.4 (L) 01/12/2017 1538   HCT 25.1 (L) 04/20/2017 1236   HCT 23.8 (L) 01/12/2017 1538   PLT 46 (L) 04/20/2017 1236   PLT 89 (L) 01/12/2017 1538   MCV 73.8 (L) 04/20/2017 1236   MCV 74.3 (L) 01/12/2017 1538   MCH 22.9 (L) 04/20/2017 1236   MCHC 31.1 (L) 04/20/2017 1236   RDW 19.0 (H) 04/20/2017 1236   RDW 21.0 (H) 01/12/2017 1538   LYMPHSABS 0.9 04/20/2017 1236   LYMPHSABS 0.6 (L) 01/12/2017 1538   MONOABS 0.2 04/20/2017 1236   MONOABS 0.3 01/12/2017 1538   EOSABS 0.3 04/20/2017 1236   EOSABS 0.1 01/12/2017 1538   BASOSABS 0.0 04/20/2017 1236   BASOSABS 0.1 01/12/2017 1538   -------------------------------  Imaging from last 24 hours (  if applicable):  Radiology interpretation: No results found.      This case was discussed with Dr. [name]. He expressed agreement with my management of this patient.

## 2017-04-22 ENCOUNTER — Inpatient Hospital Stay: Payer: Medicare Other

## 2017-04-22 ENCOUNTER — Other Ambulatory Visit: Payer: Self-pay

## 2017-04-22 ENCOUNTER — Encounter: Payer: Medicare Other | Admitting: Physician Assistant

## 2017-04-22 DIAGNOSIS — R410 Disorientation, unspecified: Secondary | ICD-10-CM | POA: Diagnosis not present

## 2017-04-22 DIAGNOSIS — R0989 Other specified symptoms and signs involving the circulatory and respiratory systems: Secondary | ICD-10-CM

## 2017-04-22 DIAGNOSIS — D61818 Other pancytopenia: Secondary | ICD-10-CM | POA: Diagnosis not present

## 2017-04-22 DIAGNOSIS — E538 Deficiency of other specified B group vitamins: Secondary | ICD-10-CM | POA: Diagnosis not present

## 2017-04-22 DIAGNOSIS — D649 Anemia, unspecified: Secondary | ICD-10-CM

## 2017-04-22 DIAGNOSIS — D509 Iron deficiency anemia, unspecified: Secondary | ICD-10-CM

## 2017-04-22 DIAGNOSIS — C9000 Multiple myeloma not having achieved remission: Secondary | ICD-10-CM | POA: Diagnosis not present

## 2017-04-22 LAB — URINALYSIS, COMPLETE (UACMP) WITH MICROSCOPIC
Bilirubin Urine: NEGATIVE
GLUCOSE, UA: NEGATIVE mg/dL
Hgb urine dipstick: NEGATIVE
KETONES UR: NEGATIVE mg/dL
NITRITE: NEGATIVE
PH: 5 (ref 5.0–8.0)
Protein, ur: NEGATIVE mg/dL
SPECIFIC GRAVITY, URINE: 1.016 (ref 1.005–1.030)
Squamous Epithelial / LPF: NONE SEEN

## 2017-04-22 LAB — ABO/RH: ABO/RH(D): A POS

## 2017-04-22 LAB — PREPARE RBC (CROSSMATCH)

## 2017-04-22 MED ORDER — ACETAMINOPHEN 325 MG PO TABS
ORAL_TABLET | ORAL | Status: AC
  Filled 2017-04-22: qty 2

## 2017-04-22 MED ORDER — ACETAMINOPHEN 325 MG PO TABS
650.0000 mg | ORAL_TABLET | Freq: Once | ORAL | Status: AC
  Administered 2017-04-22: 650 mg via ORAL

## 2017-04-22 NOTE — Patient Instructions (Signed)

## 2017-04-23 ENCOUNTER — Other Ambulatory Visit: Payer: Self-pay

## 2017-04-23 ENCOUNTER — Telehealth: Payer: Self-pay

## 2017-04-23 LAB — BPAM RBC
BLOOD PRODUCT EXPIRATION DATE: 201904062359
ISSUE DATE / TIME: 201903271335
Unit Type and Rh: 6200

## 2017-04-23 LAB — TYPE AND SCREEN
ABO/RH(D): A POS
Antibody Screen: NEGATIVE
UNIT DIVISION: 0

## 2017-04-23 MED ORDER — CIPROFLOXACIN HCL 500 MG PO TABS: 500.0000 mg | ORAL_TABLET | Freq: Every day | ORAL | 0 refills | Status: DC

## 2017-04-23 NOTE — Telephone Encounter (Signed)
Spoke with daughter. Informed per MD that urine is growing bacteria, pt to start on cipro tonight and hold trimethoprim while taking. Voiced understanding. Will call with any questions/concerns.

## 2017-04-24 LAB — URINE CULTURE: Culture: >=100000 — AB

## 2017-04-27 ENCOUNTER — Inpatient Hospital Stay: Payer: Medicare Other | Attending: Oncology

## 2017-04-27 DIAGNOSIS — D696 Thrombocytopenia, unspecified: Secondary | ICD-10-CM | POA: Insufficient documentation

## 2017-04-27 DIAGNOSIS — D709 Neutropenia, unspecified: Secondary | ICD-10-CM | POA: Diagnosis not present

## 2017-04-27 DIAGNOSIS — C9000 Multiple myeloma not having achieved remission: Secondary | ICD-10-CM | POA: Diagnosis not present

## 2017-04-27 LAB — CMP (CANCER CENTER ONLY)
ALT: 9 U/L (ref 0–55)
AST: 10 U/L (ref 5–34)
Albumin: 3.2 g/dL — ABNORMAL LOW (ref 3.5–5.0)
Alkaline Phosphatase: 51 U/L (ref 40–150)
Anion gap: 8 (ref 3–11)
BILIRUBIN TOTAL: 0.6 mg/dL (ref 0.2–1.2)
BUN: 24 mg/dL (ref 7–26)
CO2: 28 mmol/L (ref 22–29)
CREATININE: 1.14 mg/dL (ref 0.70–1.30)
Calcium: 8.7 mg/dL (ref 8.4–10.4)
Chloride: 107 mmol/L (ref 98–109)
GFR, EST NON AFRICAN AMERICAN: 55 mL/min — AB (ref >60)
Glucose, Bld: 111 mg/dL (ref 70–140)
Potassium: 3.9 mmol/L (ref 3.5–5.1)
Sodium: 143 mmol/L (ref 136–145)
TOTAL PROTEIN: 5.6 g/dL — AB (ref 6.4–8.3)

## 2017-04-27 LAB — CBC WITH DIFFERENTIAL (CANCER CENTER ONLY)
Basophils Absolute: 0 10*3/uL (ref 0.0–0.1)
Basophils Relative: 0 %
EOS PCT: 11 %
Eosinophils Absolute: 0.3 10*3/uL (ref 0.0–0.5)
HEMATOCRIT: 27.3 % — AB (ref 38.4–49.9)
Hemoglobin: 8.6 g/dL — ABNORMAL LOW (ref 13.0–17.1)
LYMPHS ABS: 0.7 10*3/uL — AB (ref 0.9–3.3)
LYMPHS PCT: 27 %
MCH: 23.5 pg — AB (ref 27.2–33.4)
MCHC: 31.5 g/dL — AB (ref 32.0–36.0)
MCV: 74.6 fL — AB (ref 79.3–98.0)
MONOS PCT: 7 %
Monocytes Absolute: 0.2 10*3/uL (ref 0.1–0.9)
Neutro Abs: 1.4 10*3/uL — ABNORMAL LOW (ref 1.5–6.5)
Neutrophils Relative %: 55 %
Platelet Count: 29 10*3/uL — ABNORMAL LOW (ref 140–400)
RBC: 3.66 MIL/uL — AB (ref 4.20–5.82)
RDW: 18.4 % — AB (ref 11.0–14.6)
WBC Count: 2.6 10*3/uL — ABNORMAL LOW (ref 4.0–10.3)

## 2017-04-28 ENCOUNTER — Telehealth: Payer: Self-pay | Admitting: *Deleted

## 2017-04-28 LAB — KAPPA/LAMBDA LIGHT CHAINS
KAPPA FREE LGHT CHN: 190.8 mg/L — AB (ref 3.3–19.4)
Kappa, lambda light chain ratio: 9.17 — ABNORMAL HIGH (ref 0.26–1.65)
Lambda free light chains: 20.8 mg/L (ref 5.7–26.3)

## 2017-04-28 LAB — IGA: IGA: 326 mg/dL (ref 61–437)

## 2017-04-28 NOTE — Telephone Encounter (Signed)
Notified pt's daughter of MD instructions to HOLD Plavix and Revlimid. She voiced understanding. She is unable to bring pt in on 4/4. Needs 4/3 or 4/5. Will review with MD.

## 2017-04-28 NOTE — Telephone Encounter (Signed)
Okay to check CBC 05/01/2017

## 2017-04-28 NOTE — Progress Notes (Signed)
Reviewed & agree with plan  

## 2017-04-28 NOTE — Telephone Encounter (Signed)
-----   Message from Ladell Pier, MD sent at 04/27/2017  5:56 PM EDT ----- Please call patient, with her lower, hold Revlimid, hold Plavix, return for CBC 04/30/2017

## 2017-04-29 DIAGNOSIS — D696 Thrombocytopenia, unspecified: Secondary | ICD-10-CM | POA: Diagnosis not present

## 2017-04-29 DIAGNOSIS — I2510 Atherosclerotic heart disease of native coronary artery without angina pectoris: Secondary | ICD-10-CM | POA: Diagnosis not present

## 2017-04-29 DIAGNOSIS — Z95810 Presence of automatic (implantable) cardiac defibrillator: Secondary | ICD-10-CM | POA: Diagnosis not present

## 2017-04-29 DIAGNOSIS — I5030 Unspecified diastolic (congestive) heart failure: Secondary | ICD-10-CM | POA: Diagnosis not present

## 2017-04-29 DIAGNOSIS — E1122 Type 2 diabetes mellitus with diabetic chronic kidney disease: Secondary | ICD-10-CM | POA: Diagnosis not present

## 2017-04-29 DIAGNOSIS — C9000 Multiple myeloma not having achieved remission: Secondary | ICD-10-CM | POA: Diagnosis not present

## 2017-04-29 DIAGNOSIS — I252 Old myocardial infarction: Secondary | ICD-10-CM | POA: Diagnosis not present

## 2017-04-29 DIAGNOSIS — I10 Essential (primary) hypertension: Secondary | ICD-10-CM | POA: Diagnosis not present

## 2017-04-29 DIAGNOSIS — J449 Chronic obstructive pulmonary disease, unspecified: Secondary | ICD-10-CM | POA: Diagnosis not present

## 2017-04-29 DIAGNOSIS — J9611 Chronic respiratory failure with hypoxia: Secondary | ICD-10-CM | POA: Diagnosis not present

## 2017-04-29 DIAGNOSIS — N183 Chronic kidney disease, stage 3 (moderate): Secondary | ICD-10-CM | POA: Diagnosis not present

## 2017-04-29 LAB — PROTEIN ELECTROPHORESIS, SERUM
A/G RATIO SPE: 1.8 — AB (ref 0.7–1.7)
ALBUMIN ELP: 3.4 g/dL (ref 2.9–4.4)
Alpha-1-Globulin: 0.2 g/dL (ref 0.0–0.4)
Alpha-2-Globulin: 0.5 g/dL (ref 0.4–1.0)
BETA GLOBULIN: 0.6 g/dL — AB (ref 0.7–1.3)
GAMMA GLOBULIN: 0.6 g/dL (ref 0.4–1.8)
Globulin, Total: 1.9 g/dL — ABNORMAL LOW (ref 2.2–3.9)
M-Spike, %: 0.2 g/dL — ABNORMAL HIGH
Total Protein ELP: 5.3 g/dL — ABNORMAL LOW (ref 6.0–8.5)

## 2017-04-29 NOTE — Telephone Encounter (Signed)
Left detailed message for daughter [name] with lab appt for 05/01/17 @ 2:30. Requested she have patient wait for result.

## 2017-05-01 ENCOUNTER — Encounter: Payer: Self-pay | Admitting: Physician Assistant

## 2017-05-01 ENCOUNTER — Inpatient Hospital Stay: Payer: Medicare Other

## 2017-05-01 ENCOUNTER — Telehealth: Payer: Self-pay | Admitting: *Deleted

## 2017-05-01 DIAGNOSIS — D696 Thrombocytopenia, unspecified: Secondary | ICD-10-CM | POA: Diagnosis not present

## 2017-05-01 DIAGNOSIS — C9000 Multiple myeloma not having achieved remission: Secondary | ICD-10-CM

## 2017-05-01 DIAGNOSIS — D709 Neutropenia, unspecified: Secondary | ICD-10-CM | POA: Diagnosis not present

## 2017-05-01 LAB — CBC WITH DIFFERENTIAL (CANCER CENTER ONLY)
BASOS ABS: 0 10*3/uL (ref 0.0–0.1)
BASOS PCT: 0 %
EOS ABS: 0.3 10*3/uL (ref 0.0–0.5)
Eosinophils Relative: 13 %
HCT: 25.8 % — ABNORMAL LOW (ref 38.4–49.9)
HEMOGLOBIN: 8 g/dL — AB (ref 13.0–17.1)
Lymphocytes Relative: 33 %
Lymphs Abs: 0.8 10*3/uL — ABNORMAL LOW (ref 0.9–3.3)
MCH: 23.3 pg — ABNORMAL LOW (ref 27.2–33.4)
MCHC: 31 g/dL — ABNORMAL LOW (ref 32.0–36.0)
MCV: 75 fL — ABNORMAL LOW (ref 79.3–98.0)
Monocytes Absolute: 0.2 10*3/uL (ref 0.1–0.9)
Monocytes Relative: 8 %
NEUTROS ABS: 1.1 10*3/uL — AB (ref 1.5–6.5)
NEUTROS PCT: 46 %
PLATELETS: 35 10*3/uL — AB (ref 140–400)
RBC: 3.44 MIL/uL — AB (ref 4.20–5.82)
RDW: 19.1 % — ABNORMAL HIGH (ref 11.0–14.6)
WBC Count: 2.3 10*3/uL — ABNORMAL LOW (ref 4.0–10.3)
nRBC: 1 /100 WBC — ABNORMAL HIGH

## 2017-05-01 LAB — SAMPLE TO BLOOD BANK

## 2017-05-01 NOTE — Telephone Encounter (Signed)
Spoke with pt and caregiver in lobby, he reports feeling OK. Denies dyspnea, bleeding or fever. CBC reviewed by Ned Card, NP: Hold Revlimid, repeat lab on 4/8.  Appts given for 4/8. Instructed pt and caregiver to keep blue blood bank bracelet intact. Call office early 4/8 if pt is feeling short of breath or extreme fatigue, we can add crossmatch if needed. Both voiced understanding.

## 2017-05-04 ENCOUNTER — Telehealth: Payer: Self-pay

## 2017-05-04 ENCOUNTER — Other Ambulatory Visit: Payer: Medicare Other

## 2017-05-04 ENCOUNTER — Telehealth: Payer: Self-pay | Admitting: *Deleted

## 2017-05-04 NOTE — Telephone Encounter (Signed)
Spoke with daughter concerning new appointment. She requested appointment be change to M/W/F. After changing appointment recalled patient and had to r/s again. Requesting all appointments be change to after 2pm. R/s apponments and call aqain with new appointments. Patient requesting for RN. Tonya to call her to discuss lab results. Per 4/5 in basket. But changes were made on 4/8

## 2017-05-04 NOTE — Telephone Encounter (Signed)
Received call from daughter [name] wanted to know pt's lab results done 05/01/17.   Spoke with [name], and gave her lab results as requested.   [name] stated she will discuss with pt ; if pt is symptomatic , [name] will call nurse back for possible lab and transfusion. [name]'s    Phone    [phone].

## 2017-05-06 ENCOUNTER — Other Ambulatory Visit: Payer: Medicare Other

## 2017-05-06 ENCOUNTER — Inpatient Hospital Stay: Payer: Medicare Other

## 2017-05-06 ENCOUNTER — Telehealth: Payer: Self-pay

## 2017-05-06 ENCOUNTER — Telehealth: Payer: Self-pay | Admitting: Oncology

## 2017-05-06 ENCOUNTER — Encounter: Payer: Self-pay | Admitting: Nurse Practitioner

## 2017-05-06 ENCOUNTER — Inpatient Hospital Stay (HOSPITAL_BASED_OUTPATIENT_CLINIC_OR_DEPARTMENT_OTHER): Payer: Medicare Other | Admitting: Nurse Practitioner

## 2017-05-06 ENCOUNTER — Ambulatory Visit: Payer: Medicare Other | Admitting: Nurse Practitioner

## 2017-05-06 VITALS — BP 157/59 | HR 62 | Temp 96.8°F | Resp 23 | Ht 72.0 in | Wt 153.5 lb

## 2017-05-06 DIAGNOSIS — C9000 Multiple myeloma not having achieved remission: Secondary | ICD-10-CM

## 2017-05-06 DIAGNOSIS — D696 Thrombocytopenia, unspecified: Secondary | ICD-10-CM

## 2017-05-06 DIAGNOSIS — D709 Neutropenia, unspecified: Secondary | ICD-10-CM | POA: Diagnosis not present

## 2017-05-06 LAB — CBC WITH DIFFERENTIAL (CANCER CENTER ONLY)
Basophils Absolute: 0 10*3/uL (ref 0.0–0.1)
Basophils Relative: 1 %
EOS ABS: 0.1 10*3/uL (ref 0.0–0.5)
EOS PCT: 5 %
HCT: 23.7 % — ABNORMAL LOW (ref 38.4–49.9)
HEMOGLOBIN: 7.4 g/dL — AB (ref 13.0–17.1)
LYMPHS ABS: 1.4 10*3/uL (ref 0.9–3.3)
Lymphocytes Relative: 49 %
MCH: 23.3 pg — AB (ref 27.2–33.4)
MCHC: 31.4 g/dL — ABNORMAL LOW (ref 32.0–36.0)
MCV: 74.1 fL — ABNORMAL LOW (ref 79.3–98.0)
MONO ABS: 0.2 10*3/uL (ref 0.1–0.9)
MONOS PCT: 8 %
NEUTROS PCT: 37 %
Neutro Abs: 1 10*3/uL — ABNORMAL LOW (ref 1.5–6.5)
Platelet Count: 46 10*3/uL — ABNORMAL LOW (ref 140–400)
RBC: 3.2 MIL/uL — ABNORMAL LOW (ref 4.20–5.82)
RDW: 19.6 % — AB (ref 11.0–14.6)
WBC Count: 2.7 10*3/uL — ABNORMAL LOW (ref 4.0–10.3)

## 2017-05-06 LAB — PREPARE RBC (CROSSMATCH)

## 2017-05-06 NOTE — Progress Notes (Signed)
  [place] OFFICE PROGRESS NOTE   Diagnosis: Multiple myeloma  INTERVAL HISTORY:   [name] [name] returns as scheduled.  He completed cycle 5 single agent Revlimid beginning 04/14/2017.  Revlimid was placed on hold beginning 04/28/2017 due to thrombocytopenia.  He denies any bleeding.  No fever or signs of infection. Appetite varies.  Energy level also varies.  He would like to resume home physical therapy.  He denies pain.  He has shortness of breath at times.  Objective:  Vital signs in last 24 hours:  Blood pressure (!) 157/59, pulse 62, temperature (!) 96.8 F (36 C), temperature source Oral, resp. rate (!) 23, height 6' (1.829 m), weight 153 lb 8 oz (69.6 kg), SpO2 97 %.    HEENT: No thrush or ulcers.  A few tiny ecchymoses at the buccal regions bilaterally. Resp: Lungs clear bilaterally. Cardio: Regular rate and rhythm. GI: Abdomen soft and nontender.  No hepatospleno megaly. Vascular: Trace bilateral pretibial/lower leg edema.   Lab Results:  Lab Results  Component Value Date   WBC 2.7 (L) 05/06/2017   HGB 9.4 (L) 02/12/2017   HCT 23.7 (L) 05/06/2017   MCV 74.1 (L) 05/06/2017   PLT 46 (L) 05/06/2017   NEUTROABS 1.0 (L) 05/06/2017    Imaging:  No results found.  Medications: I have reviewed the patient's current medications.  Assessment/Plan: 1. Pancytopenia-transfused with packed red blood cells 11/28/2016, 12/27/2016, 01/13/2017, 02/02/2017 2. History of beta thalassemia trait 3. History of vitamin B-12 deficiency-vitamin B-12 replacement initiated 11/11/2016 4. History of a serum monoclonal IgAkappaprotein-elevated serum free kappa light chains, IgA, and M spike10/16/2018  Bone marrow biopsy 12/03/2016-consistent with multiple myeloma  Bone survey 12/06/2016-no destructive or lytic bone lesions identified.  Revlimid 21 days on/7 days off and weekly dexamethasone 12/16/2016(Decadron discontinued 12/29/2016 secondary to altered mental  status)  Cycle 2 single agent Revlimid 01/14/2017(discontinued 3 days early)  Serum light chains, IgAimproved 02/02/2017  Cycle3 single agent Revlimid beginning 02/16/2017  Cycle 4 single agent Revlimid beginning 03/16/2017  Cycle 5 single agent Revlimid beginning 04/14/2017 (Revlimid placed on hold beginning 04/28/2017 due to thrombocytopenia) 5. COPD with acute and chronic respiratory failure/hypoxemia 6. History of coronary artery disease 7. Ischemic cardiomyopathy 8. ICD in place 9. Prostatic hypertrophy 10.Inappropriately low erythropoietin level 11/28/2016 11. Severe pancytopenia secondary to multiple myeloma and Revlimid 12. Fever-likely related to an upper respiratory infection, RSV positive swab 02/02/2017     Disposition: [name] [name] appears unchanged.  He began cycle 5 single agent Revlimid 04/14/2017.  Revlimid was placed on hold beginning 04/28/2017 due to thrombocytopenia.  We reviewed the CBC from today.  He has persistent thrombocytopenia and moderate neutropenia.  We will continue to hold Revlimid.  He understands to contact the office with bleeding, fever, chills, other signs of infection.  He will return for a follow-up CBC in 1 week.  He also has progressive symptomatic anemia.  We are arranging for a blood transfusion.  He will return for lab and follow-up in 1 month.  He will contact the office in the interim with any problems.  He would like to resume home physical therapy.  We made a referral to a home care agency for evaluation.    Ned Card ANP/GNP-BC   05/06/2017  3:45 PM

## 2017-05-06 NOTE — Telephone Encounter (Signed)
Referral sent to [facility] for physcial therapy.

## 2017-05-06 NOTE — Telephone Encounter (Signed)
Appts scheduled with daughter [name] per 4/10 los

## 2017-05-07 ENCOUNTER — Other Ambulatory Visit: Payer: Medicare Other

## 2017-05-07 ENCOUNTER — Ambulatory Visit: Payer: Medicare Other | Admitting: Nurse Practitioner

## 2017-05-09 ENCOUNTER — Inpatient Hospital Stay: Payer: Medicare Other

## 2017-05-09 DIAGNOSIS — D709 Neutropenia, unspecified: Secondary | ICD-10-CM | POA: Diagnosis not present

## 2017-05-09 DIAGNOSIS — C9000 Multiple myeloma not having achieved remission: Secondary | ICD-10-CM

## 2017-05-09 DIAGNOSIS — D696 Thrombocytopenia, unspecified: Secondary | ICD-10-CM | POA: Diagnosis not present

## 2017-05-09 MED ORDER — DIPHENHYDRAMINE HCL 25 MG PO CAPS
ORAL_CAPSULE | ORAL | Status: AC
  Filled 2017-05-09: qty 1

## 2017-05-09 MED ORDER — SODIUM CHLORIDE 0.9 % IV SOLN
250.0000 mL | Freq: Once | INTRAVENOUS | Status: AC
  Administered 2017-05-09: 250 mL via INTRAVENOUS

## 2017-05-09 MED ORDER — ACETAMINOPHEN 325 MG PO TABS
ORAL_TABLET | ORAL | Status: AC
  Filled 2017-05-09: qty 2

## 2017-05-09 MED ORDER — DIPHENHYDRAMINE HCL 25 MG PO TABS
25.0000 mg | ORAL_TABLET | Freq: Once | ORAL | Status: AC
  Administered 2017-05-09: 25 mg via ORAL

## 2017-05-09 MED ORDER — ACETAMINOPHEN 325 MG PO TABS
650.0000 mg | ORAL_TABLET | Freq: Once | ORAL | Status: AC
  Administered 2017-05-09: 650 mg via ORAL

## 2017-05-09 NOTE — Patient Instructions (Signed)

## 2017-05-11 LAB — BPAM RBC
Blood Product Expiration Date: 201905032359
Blood Product Expiration Date: 201905032359
ISSUE DATE / TIME: 201904130821
ISSUE DATE / TIME: 201904130821
UNIT TYPE AND RH: 6200
Unit Type and Rh: 6200

## 2017-05-11 LAB — TYPE AND SCREEN
ABO/RH(D): A POS
Antibody Screen: NEGATIVE
UNIT DIVISION: 0
Unit division: 0

## 2017-05-13 ENCOUNTER — Inpatient Hospital Stay: Payer: Medicare Other

## 2017-05-13 ENCOUNTER — Telehealth: Payer: Self-pay

## 2017-05-13 DIAGNOSIS — D61818 Other pancytopenia: Secondary | ICD-10-CM

## 2017-05-13 DIAGNOSIS — D509 Iron deficiency anemia, unspecified: Secondary | ICD-10-CM

## 2017-05-13 DIAGNOSIS — D696 Thrombocytopenia, unspecified: Secondary | ICD-10-CM | POA: Diagnosis not present

## 2017-05-13 DIAGNOSIS — C9000 Multiple myeloma not having achieved remission: Secondary | ICD-10-CM | POA: Diagnosis not present

## 2017-05-13 DIAGNOSIS — D709 Neutropenia, unspecified: Secondary | ICD-10-CM | POA: Diagnosis not present

## 2017-05-13 LAB — CBC WITH DIFFERENTIAL/PLATELET
BASOS ABS: 0 10*3/uL (ref 0.0–0.1)
BASOS PCT: 1 %
EOS ABS: 0.2 10*3/uL (ref 0.0–0.5)
EOS PCT: 8 %
HEMATOCRIT: 32.2 % — AB (ref 38.4–49.9)
Hemoglobin: 10.2 g/dL — ABNORMAL LOW (ref 13.0–17.1)
LYMPHS PCT: 26 %
Lymphs Abs: 0.8 10*3/uL — ABNORMAL LOW (ref 0.9–3.3)
MCH: 24.5 pg — ABNORMAL LOW (ref 27.2–33.4)
MCHC: 31.7 g/dL — AB (ref 32.0–36.0)
MCV: 77.2 fL — ABNORMAL LOW (ref 79.3–98.0)
MONO ABS: 0.2 10*3/uL (ref 0.1–0.9)
Monocytes Relative: 6 %
Neutro Abs: 1.8 10*3/uL (ref 1.5–6.5)
Neutrophils Relative %: 59 %
PLATELETS: 54 10*3/uL — AB (ref 140–400)
RBC: 4.17 MIL/uL — AB (ref 4.20–5.82)
RDW: 19 % — AB (ref 11.0–14.6)
WBC: 3 10*3/uL — AB (ref 4.0–10.3)

## 2017-05-13 NOTE — Telephone Encounter (Addendum)
Pt daughter voiced understanding of message below  ----- Message from Ladell Pier, MD sent at 05/13/2017  3:48 PM EDT ----- Please call patient, the white count and platelets are better, continue to hold Revlimid, repeat CBC week of 05/18/2017

## 2017-05-14 ENCOUNTER — Telehealth: Payer: Self-pay | Admitting: Oncology

## 2017-05-14 NOTE — Telephone Encounter (Signed)
Scheduled appt per 4/17 sch message - pt daughter is aware of appt date and time.

## 2017-05-18 ENCOUNTER — Telehealth: Payer: Self-pay

## 2017-05-18 NOTE — Telephone Encounter (Signed)
Call from pt daughter reporting that the pt "fell Friday evening. He hit his head, arm, finger, and leg. He is susceptible to infection, does he need to be on antibiotics?"  Per Dr. [name], no need for antibiotics unless wound is present and appears infected. Pt daughter denies any open wounds that she is aware of. Will assess and monitor for wounds and signs of infection. This RN voiced understanding.

## 2017-05-20 ENCOUNTER — Inpatient Hospital Stay: Payer: Medicare Other

## 2017-05-20 DIAGNOSIS — D61818 Other pancytopenia: Secondary | ICD-10-CM

## 2017-05-20 DIAGNOSIS — D696 Thrombocytopenia, unspecified: Secondary | ICD-10-CM | POA: Diagnosis not present

## 2017-05-20 DIAGNOSIS — D709 Neutropenia, unspecified: Secondary | ICD-10-CM | POA: Diagnosis not present

## 2017-05-20 DIAGNOSIS — C9000 Multiple myeloma not having achieved remission: Secondary | ICD-10-CM | POA: Diagnosis not present

## 2017-05-20 LAB — CBC WITH DIFFERENTIAL (CANCER CENTER ONLY)
BASOS ABS: 0 10*3/uL (ref 0.0–0.1)
BASOS PCT: 1 %
Eosinophils Absolute: 0.2 10*3/uL (ref 0.0–0.5)
Eosinophils Relative: 5 %
HCT: 32 % — ABNORMAL LOW (ref 38.4–49.9)
Hemoglobin: 10.1 g/dL — ABNORMAL LOW (ref 13.0–17.1)
LYMPHS PCT: 28 %
Lymphs Abs: 1.2 10*3/uL (ref 0.9–3.3)
MCH: 23.9 pg — ABNORMAL LOW (ref 27.2–33.4)
MCHC: 31.6 g/dL — ABNORMAL LOW (ref 32.0–36.0)
MCV: 75.7 fL — AB (ref 79.3–98.0)
Monocytes Absolute: 0.2 10*3/uL (ref 0.1–0.9)
Monocytes Relative: 5 %
NEUTROS ABS: 2.6 10*3/uL (ref 1.5–6.5)
Neutrophils Relative %: 61 %
PLATELETS: 66 10*3/uL — AB (ref 140–400)
RBC: 4.23 MIL/uL (ref 4.20–5.82)
RDW: 19.4 % — AB (ref 11.0–14.6)
WBC: 4.3 10*3/uL (ref 4.0–10.3)

## 2017-05-21 ENCOUNTER — Telehealth: Payer: Self-pay | Admitting: *Deleted

## 2017-05-21 DIAGNOSIS — I5030 Unspecified diastolic (congestive) heart failure: Secondary | ICD-10-CM | POA: Diagnosis not present

## 2017-05-21 DIAGNOSIS — I252 Old myocardial infarction: Secondary | ICD-10-CM | POA: Diagnosis not present

## 2017-05-21 DIAGNOSIS — D61818 Other pancytopenia: Secondary | ICD-10-CM | POA: Diagnosis not present

## 2017-05-21 DIAGNOSIS — J441 Chronic obstructive pulmonary disease with (acute) exacerbation: Secondary | ICD-10-CM | POA: Diagnosis not present

## 2017-05-21 DIAGNOSIS — I2510 Atherosclerotic heart disease of native coronary artery without angina pectoris: Secondary | ICD-10-CM | POA: Diagnosis not present

## 2017-05-21 DIAGNOSIS — I10 Essential (primary) hypertension: Secondary | ICD-10-CM | POA: Diagnosis not present

## 2017-05-21 DIAGNOSIS — M1990 Unspecified osteoarthritis, unspecified site: Secondary | ICD-10-CM | POA: Diagnosis not present

## 2017-05-21 DIAGNOSIS — N183 Chronic kidney disease, stage 3 (moderate): Secondary | ICD-10-CM | POA: Diagnosis not present

## 2017-05-21 DIAGNOSIS — N401 Enlarged prostate with lower urinary tract symptoms: Secondary | ICD-10-CM | POA: Diagnosis not present

## 2017-05-21 DIAGNOSIS — C9000 Multiple myeloma not having achieved remission: Secondary | ICD-10-CM

## 2017-05-21 DIAGNOSIS — E1122 Type 2 diabetes mellitus with diabetic chronic kidney disease: Secondary | ICD-10-CM | POA: Diagnosis not present

## 2017-05-21 DIAGNOSIS — E782 Mixed hyperlipidemia: Secondary | ICD-10-CM | POA: Diagnosis not present

## 2017-05-21 MED ORDER — LENALIDOMIDE 10 MG PO CAPS
10.0000 mg | ORAL_CAPSULE | Freq: Every day | ORAL | 0 refills | Status: DC
Start: 2017-05-21 — End: 2017-06-30

## 2017-05-21 NOTE — Telephone Encounter (Signed)
Notified daughter of lab result and instructions to resume Revlimid. He does not have any in the home, new order sent to [facility].

## 2017-05-21 NOTE — Telephone Encounter (Signed)
-----   Message from Ladell Pier, MD sent at 05/21/2017  5:17 PM EDT ----- Please call patient or daughter, the platelets and white cells are higher, hemoglobin is stable, okay to resume Revlimid, take for only 14 days with this cycle, follow-up as scheduled

## 2017-05-25 ENCOUNTER — Ambulatory Visit (INDEPENDENT_AMBULATORY_CARE_PROVIDER_SITE_OTHER): Payer: Medicare Other | Admitting: *Deleted

## 2017-05-25 DIAGNOSIS — I255 Ischemic cardiomyopathy: Secondary | ICD-10-CM

## 2017-05-25 DIAGNOSIS — I5032 Chronic diastolic (congestive) heart failure: Secondary | ICD-10-CM

## 2017-05-25 DIAGNOSIS — Z95810 Presence of automatic (implantable) cardiac defibrillator: Secondary | ICD-10-CM | POA: Diagnosis not present

## 2017-05-25 DIAGNOSIS — I5022 Chronic systolic (congestive) heart failure: Secondary | ICD-10-CM

## 2017-05-25 NOTE — Progress Notes (Signed)
Remote ICD transmission.   

## 2017-05-26 ENCOUNTER — Encounter: Payer: Self-pay | Admitting: Cardiology

## 2017-05-26 DIAGNOSIS — H353221 Exudative age-related macular degeneration, left eye, with active choroidal neovascularization: Secondary | ICD-10-CM | POA: Diagnosis not present

## 2017-05-26 DIAGNOSIS — H2511 Age-related nuclear cataract, right eye: Secondary | ICD-10-CM | POA: Diagnosis not present

## 2017-05-26 DIAGNOSIS — H43813 Vitreous degeneration, bilateral: Secondary | ICD-10-CM | POA: Diagnosis not present

## 2017-05-26 DIAGNOSIS — H353112 Nonexudative age-related macular degeneration, right eye, intermediate dry stage: Secondary | ICD-10-CM | POA: Diagnosis not present

## 2017-05-26 NOTE — Progress Notes (Signed)
EPIC Encounter for ICM Monitoring  Patient Name: [name] Shrum, [name] [name] is a 84 y.o. male Date: 05/26/2017 Primary Care Physican: [name], [name] [name] Primary Cardiologist:Nishan Electrophysiologist: Allred Dry Weight:unknown  Attempted call to daughter [name] [name].   Left detailed message regarding transmission.  Transmission reviewed.    Thoracic impedance normal.  No diuretic listed in med list  Recommendations: Left voice mail with ICM number and encouraged to call if experiencing any fluid symptoms.  Follow-up plan: ICM clinic phone appointment on 08/03/2017.  Office appointment scheduled 06/30/2017 with Tommye Standard, PA.  Copy of ICM check sent to Dr. Rayann Heman.   3 month ICM trend: 05/05/2017    1 Year ICM trend:       Rosalene Billings, RN 05/26/2017 3:47 PM

## 2017-06-01 ENCOUNTER — Inpatient Hospital Stay: Payer: Medicare Other | Attending: Oncology

## 2017-06-01 ENCOUNTER — Telehealth: Payer: Self-pay

## 2017-06-01 ENCOUNTER — Inpatient Hospital Stay (HOSPITAL_BASED_OUTPATIENT_CLINIC_OR_DEPARTMENT_OTHER): Payer: Medicare Other | Admitting: Nurse Practitioner

## 2017-06-01 ENCOUNTER — Encounter: Payer: Self-pay | Admitting: Nurse Practitioner

## 2017-06-01 VITALS — BP 124/59 | HR 65 | Temp 97.5°F | Resp 18 | Ht 72.0 in | Wt 149.3 lb

## 2017-06-01 DIAGNOSIS — D631 Anemia in chronic kidney disease: Secondary | ICD-10-CM | POA: Insufficient documentation

## 2017-06-01 DIAGNOSIS — D61810 Antineoplastic chemotherapy induced pancytopenia: Secondary | ICD-10-CM | POA: Insufficient documentation

## 2017-06-01 DIAGNOSIS — N189 Chronic kidney disease, unspecified: Secondary | ICD-10-CM | POA: Insufficient documentation

## 2017-06-01 DIAGNOSIS — D563 Thalassemia minor: Secondary | ICD-10-CM | POA: Diagnosis not present

## 2017-06-01 DIAGNOSIS — I255 Ischemic cardiomyopathy: Secondary | ICD-10-CM

## 2017-06-01 DIAGNOSIS — C9000 Multiple myeloma not having achieved remission: Secondary | ICD-10-CM

## 2017-06-01 DIAGNOSIS — E538 Deficiency of other specified B group vitamins: Secondary | ICD-10-CM | POA: Diagnosis not present

## 2017-06-01 LAB — CMP (CANCER CENTER ONLY)
ALBUMIN: 3.3 g/dL — AB (ref 3.5–5.0)
ALT: 9 U/L (ref 0–55)
AST: 11 U/L (ref 5–34)
Alkaline Phosphatase: 69 U/L (ref 40–150)
Anion gap: 7 (ref 3–11)
BUN: 27 mg/dL — AB (ref 7–26)
CHLORIDE: 106 mmol/L (ref 98–109)
CO2: 29 mmol/L (ref 22–29)
Calcium: 9.3 mg/dL (ref 8.4–10.4)
Creatinine: 1.29 mg/dL (ref 0.70–1.30)
GFR, Est AFR Am: 55 mL/min — ABNORMAL LOW (ref >60)
GFR, Estimated: 47 mL/min — ABNORMAL LOW (ref >60)
GLUCOSE: 91 mg/dL (ref 70–140)
POTASSIUM: 4.6 mmol/L (ref 3.5–5.1)
Sodium: 142 mmol/L (ref 136–145)
Total Bilirubin: 0.6 mg/dL (ref 0.2–1.2)
Total Protein: 6.2 g/dL — ABNORMAL LOW (ref 6.4–8.3)

## 2017-06-01 LAB — CBC WITH DIFFERENTIAL (CANCER CENTER ONLY)
BASOS ABS: 0 10*3/uL (ref 0.0–0.1)
BASOS PCT: 0 %
EOS PCT: 4 %
Eosinophils Absolute: 0.1 10*3/uL (ref 0.0–0.5)
HCT: 24.5 % — ABNORMAL LOW (ref 38.4–49.9)
Hemoglobin: 7.8 g/dL — ABNORMAL LOW (ref 13.0–17.1)
Lymphocytes Relative: 19 %
Lymphs Abs: 0.7 10*3/uL — ABNORMAL LOW (ref 0.9–3.3)
MCH: 23.9 pg — ABNORMAL LOW (ref 27.2–33.4)
MCHC: 31.8 g/dL — ABNORMAL LOW (ref 32.0–36.0)
MCV: 74.9 fL — AB (ref 79.3–98.0)
Monocytes Absolute: 0.2 10*3/uL (ref 0.1–0.9)
Monocytes Relative: 5 %
NEUTROS ABS: 2.6 10*3/uL (ref 1.5–6.5)
Neutrophils Relative %: 72 %
PLATELETS: 58 10*3/uL — AB (ref 140–400)
RBC: 3.27 MIL/uL — AB (ref 4.20–5.82)
RDW: 18.8 % — ABNORMAL HIGH (ref 11.0–14.6)
WBC Count: 3.6 10*3/uL — ABNORMAL LOW (ref 4.0–10.3)

## 2017-06-01 LAB — SAMPLE TO BLOOD BANK

## 2017-06-01 NOTE — Progress Notes (Addendum)
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  He began cycle 6 Revlimid 05/23/2017.  He denies nausea.  No mouth sores.  No diarrhea.  He recently fell out of his wheelchair trying to transfer.  He has bruising on his face and abrasions on his leg.  Objective:  Vital signs in last 24 hours:  Blood pressure (!) 124/59, pulse 65, temperature (!) 97.5 F (36.4 C), temperature source Oral, resp. rate 18, height 6' (1.829 m), weight 149 lb 4.8 oz (67.7 kg), SpO2 100 %.    HEENT: Ecchymosis mainly along the left face.  Left eye with subconjunctival hemorrhage. Resp: Lungs clear bilaterally. Cardio: Regular rate and rhythm. GI: Abdomen soft and nontender.  No hepatomegaly. Vascular: No leg edema. Neuro: Alert and oriented.  Hard of hearing. Skin: Abrasions at the left lower leg.   Lab Results:  Lab Results  Component Value Date   WBC 3.6 (L) 06/01/2017   HGB 7.8 (L) 06/01/2017   HCT 24.5 (L) 06/01/2017   MCV 74.9 (L) 06/01/2017   PLT 58 (L) 06/01/2017   NEUTROABS 2.6 06/01/2017    Imaging:  No results found.  Medications: I have reviewed the patient's current medications.  Assessment/Plan: . Pancytopenia-transfused with packed red blood cells 11/28/2016, 12/27/2016, 01/13/2017, 02/02/2017 2. History of beta thalassemia trait 3. History of vitamin B-12 deficiency-vitamin B-12 replacement initiated 11/11/2016 4. History of a serum monoclonal IgAkappaprotein-elevated serum free kappa light chains, IgA, and M spike10/16/2018  Bone marrow biopsy 12/03/2016-consistent with multiple myeloma  Bone survey 12/06/2016-no destructive or lytic bone lesions identified.  Revlimid 21 days on/7 days off and weekly dexamethasone 12/16/2016(Decadron discontinued 12/29/2016 secondary to altered mental status)  Cycle 2 single agent Revlimid 01/14/2017(discontinued 3 days early)  Serum light chains,  IgAimproved 02/02/2017  Cycle3 single agent Revlimid beginning 02/16/2017  Cycle 4 single agent Revlimid beginning 03/16/2017  Cycle 5 single agent Revlimid beginning 04/14/2017 (Revlimid placed on hold beginning 04/28/2017 due to thrombocytopenia)  Cycle 6 single agent Revlimid beginning 05/23/2017 x 14 days 5. COPD with acute and chronic respiratory failure/hypoxemia 6. History of coronary artery disease 7. Ischemic cardiomyopathy 8. ICD in place 9. Prostatic hypertrophy 10.Inappropriately low erythropoietin level 11/28/2016 11. Severe pancytopenia secondary to multiple myeloma and Revlimid 12. 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  He is currently completing cycle 6 Revlimid.  We will follow-up on the outstanding myeloma labs from today.  [name] discussed resuming a steroid, adding Velcade if there has been no significant improvement in the myeloma labs.  We reviewed the CBC from today.  He has mild leukopenia and stable moderate thrombocytopenia.  He has progressive anemia but appears asymptomatic.  He will return for a follow-up CBC in 1 week.  Continue as needed red cell transfusion support.  He will return for lab and follow-up in 2 weeks.  He will contact the office in the interim with any problems.  Patient seen with [name].    Ned Card ANP/GNP-BC   06/01/2017  3:22 PM This was a shared visit with Ned Card.  [name] has persistent severe anemia while on Revlimid.  The myeloma markers have not improved significantly.  He is completing a 2-week course of Revlimid at present.  He will return for a CBC in 1 week and an office visit in 2 weeks.  I will discuss  adding prednisone and Velcade with Dr. French [name] and his daughter if the next myeloma markers are not improved.  We will also consider a trial of erythropoietin therapy. Julieanne Manson, MD

## 2017-06-01 NOTE — Telephone Encounter (Signed)
Printed avs and calender of upcoming appointment. Per 5/6 los 

## 2017-06-02 LAB — KAPPA/LAMBDA LIGHT CHAINS
Kappa free light chain: 234.1 mg/L — ABNORMAL HIGH (ref 3.3–19.4)
Kappa, lambda light chain ratio: 20.36 — ABNORMAL HIGH (ref 0.26–1.65)
Lambda free light chains: 11.5 mg/L (ref 5.7–26.3)

## 2017-06-02 LAB — IGA: IgA: 495 mg/dL — ABNORMAL HIGH (ref 61–437)

## 2017-06-03 LAB — PROTEIN ELECTROPHORESIS, SERUM
A/G Ratio: 1.3 (ref 0.7–1.7)
ALPHA-2-GLOBULIN: 0.7 g/dL (ref 0.4–1.0)
Albumin ELP: 3.2 g/dL (ref 2.9–4.4)
Alpha-1-Globulin: 0.4 g/dL (ref 0.0–0.4)
BETA GLOBULIN: 1.1 g/dL (ref 0.7–1.3)
GAMMA GLOBULIN: 0.3 g/dL — AB (ref 0.4–1.8)
Globulin, Total: 2.5 g/dL (ref 2.2–3.9)
M-SPIKE, %: 0.3 g/dL — AB
Total Protein ELP: 5.7 g/dL — ABNORMAL LOW (ref 6.0–8.5)

## 2017-06-08 ENCOUNTER — Telehealth: Payer: Self-pay | Admitting: *Deleted

## 2017-06-08 ENCOUNTER — Other Ambulatory Visit: Payer: Self-pay | Admitting: *Deleted

## 2017-06-08 ENCOUNTER — Inpatient Hospital Stay: Payer: Medicare Other

## 2017-06-08 ENCOUNTER — Telehealth: Payer: Self-pay | Admitting: Emergency Medicine

## 2017-06-08 DIAGNOSIS — C9000 Multiple myeloma not having achieved remission: Secondary | ICD-10-CM | POA: Diagnosis not present

## 2017-06-08 DIAGNOSIS — D563 Thalassemia minor: Secondary | ICD-10-CM | POA: Diagnosis not present

## 2017-06-08 DIAGNOSIS — E538 Deficiency of other specified B group vitamins: Secondary | ICD-10-CM | POA: Diagnosis not present

## 2017-06-08 DIAGNOSIS — D61810 Antineoplastic chemotherapy induced pancytopenia: Secondary | ICD-10-CM | POA: Diagnosis not present

## 2017-06-08 DIAGNOSIS — D631 Anemia in chronic kidney disease: Secondary | ICD-10-CM | POA: Diagnosis not present

## 2017-06-08 DIAGNOSIS — N189 Chronic kidney disease, unspecified: Secondary | ICD-10-CM | POA: Diagnosis not present

## 2017-06-08 DIAGNOSIS — C9001 Multiple myeloma in remission: Secondary | ICD-10-CM

## 2017-06-08 LAB — CBC WITH DIFFERENTIAL (CANCER CENTER ONLY)
BASOS ABS: 0 10*3/uL (ref 0.0–0.1)
BASOS PCT: 0 %
Eosinophils Absolute: 0.1 10*3/uL (ref 0.0–0.5)
Eosinophils Relative: 3 %
HCT: 24.5 % — ABNORMAL LOW (ref 38.4–49.9)
HEMOGLOBIN: 7.7 g/dL — AB (ref 13.0–17.1)
Lymphocytes Relative: 17 %
Lymphs Abs: 0.6 10*3/uL — ABNORMAL LOW (ref 0.9–3.3)
MCH: 23.5 pg — ABNORMAL LOW (ref 27.2–33.4)
MCHC: 31.4 g/dL — ABNORMAL LOW (ref 32.0–36.0)
MCV: 74.7 fL — ABNORMAL LOW (ref 79.3–98.0)
MONO ABS: 0.3 10*3/uL (ref 0.1–0.9)
Monocytes Relative: 8 %
NEUTROS ABS: 2.6 10*3/uL (ref 1.5–6.5)
NEUTROS PCT: 72 %
Platelet Count: 44 10*3/uL — ABNORMAL LOW (ref 140–400)
RBC: 3.28 MIL/uL — AB (ref 4.20–5.82)
RDW: 19.2 % — AB (ref 11.0–14.6)
WBC: 3.6 10*3/uL — AB (ref 4.0–10.3)

## 2017-06-08 LAB — PREPARE RBC (CROSSMATCH)

## 2017-06-08 LAB — SAMPLE TO BLOOD BANK

## 2017-06-08 NOTE — Telephone Encounter (Signed)
Pts HGB is 7.7. Called pts daughter to see if [name] [name] was symptomatic. She was not with the pt at that time and asked me to call him directly. Called pts home phone with no answer, VM left for pt to call back. Routing to MD. Hold tube sent to blood bank.

## 2017-06-08 NOTE — Telephone Encounter (Signed)
Spoke with pt and caregiver. Pt is requesting blood transfusion this week. Reviewed with Dr. [name], order received.

## 2017-06-08 NOTE — Telephone Encounter (Signed)
-----   Message from Owens Shark, NP sent at 06/08/2017  4:49 PM EDT ----- Please let him know hemoglobin is stable.  Does he feel like he needs a blood transfusion?

## 2017-06-09 ENCOUNTER — Telehealth: Payer: Self-pay | Admitting: Oncology

## 2017-06-09 NOTE — Telephone Encounter (Signed)
Spoke with pt's daughter re appts scheduled per 5/13 sch msg

## 2017-06-10 ENCOUNTER — Inpatient Hospital Stay: Payer: Medicare Other

## 2017-06-10 DIAGNOSIS — E538 Deficiency of other specified B group vitamins: Secondary | ICD-10-CM | POA: Diagnosis not present

## 2017-06-10 DIAGNOSIS — D61810 Antineoplastic chemotherapy induced pancytopenia: Secondary | ICD-10-CM | POA: Diagnosis not present

## 2017-06-10 DIAGNOSIS — C9001 Multiple myeloma in remission: Secondary | ICD-10-CM

## 2017-06-10 DIAGNOSIS — D563 Thalassemia minor: Secondary | ICD-10-CM | POA: Diagnosis not present

## 2017-06-10 DIAGNOSIS — N189 Chronic kidney disease, unspecified: Secondary | ICD-10-CM | POA: Diagnosis not present

## 2017-06-10 DIAGNOSIS — C9000 Multiple myeloma not having achieved remission: Secondary | ICD-10-CM

## 2017-06-10 DIAGNOSIS — D631 Anemia in chronic kidney disease: Secondary | ICD-10-CM | POA: Diagnosis not present

## 2017-06-10 MED ORDER — SODIUM CHLORIDE 0.9 % IV SOLN: 250.0000 mL | Freq: Once | INTRAVENOUS | Status: DC

## 2017-06-10 MED ORDER — SODIUM CHLORIDE 0.9% FLUSH
10.0000 mL | INTRAVENOUS | Status: DC | PRN
  Filled 2017-06-10: qty 10

## 2017-06-10 MED ORDER — ACETAMINOPHEN 325 MG PO TABS
ORAL_TABLET | ORAL | Status: AC
  Filled 2017-06-10: qty 2

## 2017-06-10 MED ORDER — ONDANSETRON 8 MG PO TBDP
8.0000 mg | ORAL_TABLET | Freq: Once | ORAL | Status: DC
  Filled 2017-06-10: qty 1

## 2017-06-10 MED ORDER — DIPHENHYDRAMINE HCL 25 MG PO CAPS
ORAL_CAPSULE | ORAL | Status: AC
Start: 2017-06-10 — End: ?
  Filled 2017-06-10: qty 1

## 2017-06-10 MED ORDER — HEPARIN SOD (PORK) LOCK FLUSH 100 UNIT/ML IV SOLN
500.0000 [IU] | Freq: Every day | INTRAVENOUS | Status: DC | PRN
  Filled 2017-06-10: qty 5

## 2017-06-10 MED ORDER — DIPHENHYDRAMINE HCL 25 MG PO CAPS
25.0000 mg | ORAL_CAPSULE | Freq: Once | ORAL | Status: AC
  Administered 2017-06-10: 25 mg via ORAL

## 2017-06-10 MED ORDER — ACETAMINOPHEN 325 MG PO TABS
650.0000 mg | ORAL_TABLET | Freq: Once | ORAL | Status: AC
  Administered 2017-06-10: 650 mg via ORAL

## 2017-06-10 NOTE — Patient Instructions (Signed)

## 2017-06-11 LAB — TYPE AND SCREEN
ABO/RH(D): A POS
ANTIBODY SCREEN: NEGATIVE
UNIT DIVISION: 0

## 2017-06-11 LAB — BPAM RBC
Blood Product Expiration Date: 201906022359
ISSUE DATE / TIME: 201905151549
Unit Type and Rh: 6200

## 2017-06-15 ENCOUNTER — Inpatient Hospital Stay: Payer: Medicare Other

## 2017-06-15 ENCOUNTER — Inpatient Hospital Stay (HOSPITAL_BASED_OUTPATIENT_CLINIC_OR_DEPARTMENT_OTHER): Payer: Medicare Other | Admitting: Oncology

## 2017-06-15 ENCOUNTER — Telehealth: Payer: Self-pay | Admitting: Oncology

## 2017-06-15 VITALS — BP 127/52 | HR 54 | Temp 98.4°F | Resp 17 | Ht 72.0 in | Wt 151.9 lb

## 2017-06-15 DIAGNOSIS — D563 Thalassemia minor: Secondary | ICD-10-CM

## 2017-06-15 DIAGNOSIS — C9000 Multiple myeloma not having achieved remission: Secondary | ICD-10-CM

## 2017-06-15 DIAGNOSIS — D61810 Antineoplastic chemotherapy induced pancytopenia: Secondary | ICD-10-CM

## 2017-06-15 DIAGNOSIS — I255 Ischemic cardiomyopathy: Secondary | ICD-10-CM | POA: Diagnosis not present

## 2017-06-15 DIAGNOSIS — E538 Deficiency of other specified B group vitamins: Secondary | ICD-10-CM | POA: Diagnosis not present

## 2017-06-15 DIAGNOSIS — N189 Chronic kidney disease, unspecified: Secondary | ICD-10-CM | POA: Diagnosis not present

## 2017-06-15 DIAGNOSIS — D631 Anemia in chronic kidney disease: Secondary | ICD-10-CM

## 2017-06-15 LAB — CBC WITH DIFFERENTIAL (CANCER CENTER ONLY)
BASOS ABS: 0 10*3/uL (ref 0.0–0.1)
Basophils Relative: 0 %
Eosinophils Absolute: 0.1 10*3/uL (ref 0.0–0.5)
Eosinophils Relative: 3 %
HCT: 24.3 % — ABNORMAL LOW (ref 38.4–49.9)
HEMOGLOBIN: 7.7 g/dL — AB (ref 13.0–17.1)
LYMPHS PCT: 29 %
Lymphs Abs: 0.7 10*3/uL — ABNORMAL LOW (ref 0.9–3.3)
MCH: 23.9 pg — ABNORMAL LOW (ref 27.2–33.4)
MCHC: 31.7 g/dL — ABNORMAL LOW (ref 32.0–36.0)
MCV: 75.5 fL — AB (ref 79.3–98.0)
Monocytes Absolute: 0.2 10*3/uL (ref 0.1–0.9)
Monocytes Relative: 10 %
NEUTROS ABS: 1.4 10*3/uL — AB (ref 1.5–6.5)
Neutrophils Relative %: 58 %
PLATELETS: 36 10*3/uL — AB (ref 140–400)
RBC: 3.22 MIL/uL — AB (ref 4.20–5.82)
RDW: 19 % — ABNORMAL HIGH (ref 11.0–14.6)
WBC: 2.4 10*3/uL — AB (ref 4.0–10.3)

## 2017-06-15 LAB — CMP (CANCER CENTER ONLY)
ALBUMIN: 3.1 g/dL — AB (ref 3.5–5.0)
ALT: 8 U/L (ref 0–55)
ANION GAP: 6 (ref 3–11)
AST: 9 U/L (ref 5–34)
Alkaline Phosphatase: 53 U/L (ref 40–150)
BILIRUBIN TOTAL: 0.4 mg/dL (ref 0.2–1.2)
BUN: 33 mg/dL — ABNORMAL HIGH (ref 7–26)
CHLORIDE: 109 mmol/L (ref 98–109)
CO2: 28 mmol/L (ref 22–29)
Calcium: 8.1 mg/dL — ABNORMAL LOW (ref 8.4–10.4)
Creatinine: 1.3 mg/dL (ref 0.70–1.30)
GFR, EST NON AFRICAN AMERICAN: 47 mL/min — AB (ref >60)
GFR, Est AFR Am: 54 mL/min — ABNORMAL LOW (ref >60)
Glucose, Bld: 103 mg/dL (ref 70–140)
POTASSIUM: 3.5 mmol/L (ref 3.5–5.1)
Sodium: 143 mmol/L (ref 136–145)
TOTAL PROTEIN: 5.5 g/dL — AB (ref 6.4–8.3)

## 2017-06-15 LAB — SAMPLE TO BLOOD BANK

## 2017-06-15 NOTE — Progress Notes (Signed)
Made MD aware of Hgb 7.7 and platelets of 36. MD voiced understanding.

## 2017-06-15 NOTE — Telephone Encounter (Signed)
Appts scheduled AVS/Calendar printed per 5/20 los °

## 2017-06-15 NOTE — Progress Notes (Signed)
[name] OFFICE PROGRESS NOTE   Diagnosis: Myeloma  INTERVAL HISTORY:   [name] [name] returns for a scheduled visit.  He is currently off of Revlimid.  He was last transfused with 1 unit of packed red blood cells on 06/10/2017.  No fever or bleeding.  No significant dyspnea today.  Objective:  Vital signs in last 24 hours:  Blood pressure (!) 127/52, pulse (!) 54, temperature 98.4 F (36.9 C), temperature source Oral, resp. rate 17, height 6' (1.829 m), weight 151 lb 14.4 oz (68.9 kg), SpO2 97 %.     Resp: Distant breath sounds, no respiratory distress Cardio: Distant heart sounds Vascular: Trace pitting edema at the low leg bilaterally   Lab Results:  Lab Results  Component Value Date   WBC 2.4 (L) 06/15/2017   HGB 7.7 (L) 06/15/2017   HCT 24.3 (L) 06/15/2017   MCV 75.5 (L) 06/15/2017   PLT 36 (L) 06/15/2017   NEUTROABS 1.4 (L) 06/15/2017    CMP     Component Value Date/Time   NA 143 06/15/2017 1541   NA 140 01/12/2017 1538   K 3.5 06/15/2017 1541   K 4.4 01/12/2017 1538   CL 109 06/15/2017 1541   CO2 28 06/15/2017 1541   CO2 27 01/12/2017 1538   GLUCOSE 103 06/15/2017 1541   GLUCOSE 84 01/12/2017 1538   BUN 33 (H) 06/15/2017 1541   BUN 23.7 01/12/2017 1538   CREATININE 1.30 06/15/2017 1541   CREATININE 1.0 01/12/2017 1538   CALCIUM 8.1 (L) 06/15/2017 1541   CALCIUM 8.7 01/12/2017 1538   PROT 5.5 (L) 06/15/2017 1541   PROT 5.7 (L) 01/12/2017 1538   PROT 6.1 (L) 01/12/2017 1538   ALBUMIN 3.1 (L) 06/15/2017 1541   ALBUMIN 2.9 (L) 01/12/2017 1538   AST 9 06/15/2017 1541   AST 11 01/12/2017 1538   ALT 8 06/15/2017 1541   ALT 12 01/12/2017 1538   ALKPHOS 53 06/15/2017 1541   ALKPHOS 49 01/12/2017 1538   BILITOT 0.4 06/15/2017 1541   BILITOT 0.47 01/12/2017 1538   GFRNONAA 47 (L) 06/15/2017 1541   GFRAA 54 (L) 06/15/2017 1541     Medications: I have reviewed the patient's current medications.   Assessment/Plan: 1.  Pancytopenia-transfused with packed red blood cells 11/28/2016, 12/27/2016, 01/13/2017, 02/02/2017 2. History of beta thalassemia trait 3. History of vitamin B-12 deficiency-vitamin B-12 replacement initiated 11/11/2016 4. History of a serum monoclonal IgAkappaprotein-elevated serum free kappa light chains, IgA, and M spike10/16/2018  Bone marrow biopsy 12/03/2016-consistent with multiple myeloma  Bone survey 12/06/2016-no destructive or lytic bone lesions identified.  Revlimid 21 days on/7 days off and weekly dexamethasone 12/16/2016(Decadron discontinued 12/29/2016 secondary to altered mental status)  Cycle 2 single agent Revlimid 01/14/2017(discontinued 3 days early)  Serum light chains, IgAimproved 02/02/2017  Cycle3 single agent Revlimid beginning 02/16/2017  Cycle4single agent Revlimid beginning 03/16/2017  Cycle 5 single agent Revlimid beginning 04/14/2017(Revlimid placed on hold beginning 04/28/2017 due to thrombocytopenia)  Cycle 6 single agent Revlimid beginning 05/23/2017 x 14 days 5. COPD with acute and chronic respiratory failure/hypoxemia 6. History of coronary artery disease 7. Ischemic cardiomyopathy 8. ICD in place 9. Prostatic hypertrophy 10.Inappropriately low erythropoietin level 11/28/2016 11. Severe pancytopenia secondary to multiple myeloma and Revlimid 12. Fever-likely related to an upper respiratory infection, RSV positive swab 02/02/2017    Disposition: [name] [name] has multiple myeloma.  He has completed multiple cycles of single agent Revlimid.  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  I discussed treatment options with [name] [name] and his daughter.  We discussed Velcade, carfilzomib, and ixazomib therapies.  We also discussed a trial of prednisone instead of Decadron.  He does not wish to consider treatment that can cause neuropathy.  He has chronic renal insufficiency with an  inappropriately low erythropoietin level when tested last fall.  We decided to discontinue Revlimid.  He will begin a trial of erythropoietin for treatment of anemia secondary to renal insufficiency.  We reviewed risk associated with rhythm brought in therapy including the increased risk of venous and arterial thromboembolic disease.  We also discussed the adverse cancer related outcomes associated with erythropoietin use in cancer patients.  He agrees to proceed with a trial of erythropoietin.  He will begin erythropoietin on a weekly schedule.  He will continue iron.   [name], MD  06/15/2017  4:27 PM

## 2017-06-16 ENCOUNTER — Ambulatory Visit: Payer: Medicare Other | Admitting: Oncology

## 2017-06-16 LAB — CUP PACEART REMOTE DEVICE CHECK
Implantable Lead Implant Date: 20111117
Implantable Lead Location: 753860
Implantable Lead Model: 7122
Implantable Pulse Generator Implant Date: 20111117
MDC IDC LEAD IMPLANT DT: 20111117
MDC IDC LEAD LOCATION: 753859
MDC IDC PG SERIAL: 623396
MDC IDC SESS DTM: 20190521092124

## 2017-06-16 LAB — KAPPA/LAMBDA LIGHT CHAINS
Kappa free light chain: 209.6 mg/L — ABNORMAL HIGH (ref 3.3–19.4)
Kappa, lambda light chain ratio: 9.93 — ABNORMAL HIGH (ref 0.26–1.65)
LAMDA FREE LIGHT CHAINS: 21.1 mg/L (ref 5.7–26.3)

## 2017-06-16 LAB — IGA: IgA: 403 mg/dL (ref 61–437)

## 2017-06-17 ENCOUNTER — Inpatient Hospital Stay: Payer: Medicare Other

## 2017-06-17 VITALS — BP 124/62 | HR 58 | Temp 98.4°F | Resp 16

## 2017-06-17 DIAGNOSIS — D563 Thalassemia minor: Secondary | ICD-10-CM | POA: Diagnosis not present

## 2017-06-17 DIAGNOSIS — D631 Anemia in chronic kidney disease: Secondary | ICD-10-CM | POA: Diagnosis not present

## 2017-06-17 DIAGNOSIS — C9000 Multiple myeloma not having achieved remission: Secondary | ICD-10-CM | POA: Diagnosis not present

## 2017-06-17 DIAGNOSIS — N189 Chronic kidney disease, unspecified: Secondary | ICD-10-CM | POA: Diagnosis not present

## 2017-06-17 DIAGNOSIS — E538 Deficiency of other specified B group vitamins: Secondary | ICD-10-CM

## 2017-06-17 DIAGNOSIS — D61810 Antineoplastic chemotherapy induced pancytopenia: Secondary | ICD-10-CM | POA: Diagnosis not present

## 2017-06-17 MED ORDER — CYANOCOBALAMIN 1000 MCG/ML IJ SOLN
1000.0000 ug | Freq: Once | INTRAMUSCULAR | Status: AC
  Administered 2017-06-17: 1000 ug via INTRAMUSCULAR

## 2017-06-17 MED ORDER — EPOETIN ALFA 40000 UNIT/ML IJ SOLN
40000.0000 [IU] | Freq: Once | INTRAMUSCULAR | Status: AC
  Administered 2017-06-17: 40000 [IU] via SUBCUTANEOUS

## 2017-06-17 NOTE — Patient Instructions (Signed)
Epoetin Alfa injection What is this medicine? EPOETIN ALFA (e POE e tin AL fa) helps your body make more red blood cells. This medicine is used to treat anemia caused by chronic kidney failure, cancer chemotherapy, or HIV-therapy. It may also be used before surgery if you have anemia. This medicine may be used for other purposes; ask your health care provider or pharmacist if you have questions. COMMON BRAND NAME(S): Epogen, Procrit What should I tell my health care provider before I take this medicine? They need to know if you have any of these conditions: -blood clotting disorders -cancer patient not on chemotherapy -cystic fibrosis -heart disease, such as angina or heart failure -hemoglobin level of 12 g/dL or greater -high blood pressure -low levels of folate, iron, or vitamin B12 -seizures -an unusual or allergic reaction to erythropoietin, albumin, benzyl alcohol, hamster proteins, other medicines, foods, dyes, or preservatives -pregnant or trying to get pregnant -breast-feeding How should I use this medicine? This medicine is for injection into a vein or under the skin. It is usually given by a health care professional in a hospital or clinic setting. If you get this medicine at home, you will be taught how to prepare and give this medicine. Use exactly as directed. Take your medicine at regular intervals. Do not take your medicine more often than directed. It is important that you put your used needles and syringes in a special sharps container. Do not put them in a trash can. If you do not have a sharps container, call your pharmacist or healthcare provider to get one. A special MedGuide will be given to you by the pharmacist with each prescription and refill. Be sure to read this information carefully each time. Talk to your pediatrician regarding the use of this medicine in children. While this drug may be prescribed for selected conditions, precautions do apply. Overdosage: If you  think you have taken too much of this medicine contact a poison control center or emergency room at once. NOTE: This medicine is only for you. Do not share this medicine with others. What if I miss a dose? If you miss a dose, take it as soon as you can. If it is almost time for your next dose, take only that dose. Do not take double or extra doses. What may interact with this medicine? Do not take this medicine with any of the following medications: -darbepoetin alfa This list may not describe all possible interactions. Give your health care provider a list of all the medicines, herbs, non-prescription drugs, or dietary supplements you use. Also tell them if you smoke, drink alcohol, or use illegal drugs. Some items may interact with your medicine. What should I watch for while using this medicine? Your condition will be monitored carefully while you are receiving this medicine. You may need blood work done while you are taking this medicine. What side effects may I notice from receiving this medicine? Side effects that you should report to your doctor or health care professional as soon as possible: -allergic reactions like skin rash, itching or hives, swelling of the face, lips, or tongue -breathing problems -changes in vision -chest pain -confusion, trouble speaking or understanding -feeling faint or lightheaded, falls -high blood pressure -muscle aches or pains -pain, swelling, warmth in the leg -rapid weight gain -severe headaches -sudden numbness or weakness of the face, arm or leg -trouble walking, dizziness, loss of balance or coordination -seizures (convulsions) -swelling of the ankles, feet, hands -unusually weak or tired  Side effects that usually do not require medical attention (report to your doctor or health care professional if they continue or are bothersome): -diarrhea -fever, chills (flu-like symptoms) -headaches -nausea, vomiting -redness, stinging, or swelling at  site where injected This list may not describe all possible side effects. Call your doctor for medical advice about side effects. You may report side effects to FDA at 1-800-FDA-1088. Where should I keep my medicine? Keep out of the reach of children. Store in a refrigerator between 2 and 8 degrees C (36 and 46 degrees F). Do not freeze or shake. Throw away any unused portion if using a single-dose vial. Multi-dose vials can be kept in the refrigerator for up to 21 days after the initial dose. Throw away unused medicine. NOTE: This sheet is a summary. It may not cover all possible information. If you have questions about this medicine, talk to your doctor, pharmacist, or health care provider.  2018 Elsevier/Gold Standard (2015-09-03 19:42:31) Cyanocobalamin, Vitamin B12 injection What is this medicine? CYANOCOBALAMIN (sye an oh koe BAL a min) is a man made form of vitamin B12. Vitamin B12 is used in the growth of healthy blood cells, nerve cells, and proteins in the body. It also helps with the metabolism of fats and carbohydrates. This medicine is used to treat people who can not absorb vitamin B12. This medicine may be used for other purposes; ask your health care provider or pharmacist if you have questions. COMMON BRAND NAME(S): B-12 Compliance Kit, B-12 Injection Kit, Cyomin, LA-12, Nutri-Twelve, Physicians EZ Use B-12, Primabalt What should I tell my health care provider before I take this medicine? They need to know if you have any of these conditions: -kidney disease -Leber's disease -megaloblastic anemia -an unusual or allergic reaction to cyanocobalamin, cobalt, other medicines, foods, dyes, or preservatives -pregnant or trying to get pregnant -breast-feeding How should I use this medicine? This medicine is injected into a muscle or deeply under the skin. It is usually given by a health care professional in a clinic or doctor's office. However, your doctor may teach you how to inject  yourself. Follow all instructions. Talk to your pediatrician regarding the use of this medicine in children. Special care may be needed. Overdosage: If you think you have taken too much of this medicine contact a poison control center or emergency room at once. NOTE: This medicine is only for you. Do not share this medicine with others. What if I miss a dose? If you are given your dose at a clinic or doctor's office, call to reschedule your appointment. If you give your own injections and you miss a dose, take it as soon as you can. If it is almost time for your next dose, take only that dose. Do not take double or extra doses. What may interact with this medicine? -colchicine -heavy alcohol intake This list may not describe all possible interactions. Give your health care provider a list of all the medicines, herbs, non-prescription drugs, or dietary supplements you use. Also tell them if you smoke, drink alcohol, or use illegal drugs. Some items may interact with your medicine. What should I watch for while using this medicine? Visit your doctor or health care professional regularly. You may need blood work done while you are taking this medicine. You may need to follow a special diet. Talk to your doctor. Limit your alcohol intake and avoid smoking to get the best benefit. What side effects may I notice from receiving this medicine? Side effects  that you should report to your doctor or health care professional as soon as possible: -allergic reactions like skin rash, itching or hives, swelling of the face, lips, or tongue -blue tint to skin -chest tightness, pain -difficulty breathing, wheezing -dizziness -red, swollen painful area on the leg Side effects that usually do not require medical attention (report to your doctor or health care professional if they continue or are bothersome): -diarrhea -headache This list may not describe all possible side effects. Call your doctor for medical  advice about side effects. You may report side effects to FDA at 1-800-FDA-1088. Where should I keep my medicine? Keep out of the reach of children. Store at room temperature between 15 and 30 degrees C (59 and 85 degrees F). Protect from light. Throw away any unused medicine after the expiration date. NOTE: This sheet is a summary. It may not cover all possible information. If you have questions about this medicine, talk to your doctor, pharmacist, or health care provider.  2018 Elsevier/Gold Standard (2007-04-26 22:10:20)

## 2017-06-18 LAB — PROTEIN ELECTROPHORESIS, SERUM
A/G RATIO SPE: 1.4 (ref 0.7–1.7)
ALBUMIN ELP: 3.1 g/dL (ref 2.9–4.4)
ALPHA-1-GLOBULIN: 0.3 g/dL (ref 0.0–0.4)
ALPHA-2-GLOBULIN: 0.6 g/dL (ref 0.4–1.0)
Beta Globulin: 0.6 g/dL — ABNORMAL LOW (ref 0.7–1.3)
Gamma Globulin: 0.7 g/dL (ref 0.4–1.8)
Globulin, Total: 2.2 g/dL (ref 2.2–3.9)
M-Spike, %: 0.3 g/dL — ABNORMAL HIGH
Total Protein ELP: 5.3 g/dL — ABNORMAL LOW (ref 6.0–8.5)

## 2017-06-24 ENCOUNTER — Inpatient Hospital Stay: Payer: Medicare Other

## 2017-06-24 ENCOUNTER — Other Ambulatory Visit: Payer: Self-pay | Admitting: *Deleted

## 2017-06-24 VITALS — BP 124/60 | HR 54 | Temp 98.2°F | Resp 18

## 2017-06-24 DIAGNOSIS — C9000 Multiple myeloma not having achieved remission: Secondary | ICD-10-CM

## 2017-06-24 DIAGNOSIS — D631 Anemia in chronic kidney disease: Secondary | ICD-10-CM

## 2017-06-24 DIAGNOSIS — N189 Chronic kidney disease, unspecified: Secondary | ICD-10-CM | POA: Diagnosis not present

## 2017-06-24 DIAGNOSIS — D61810 Antineoplastic chemotherapy induced pancytopenia: Secondary | ICD-10-CM | POA: Diagnosis not present

## 2017-06-24 DIAGNOSIS — E538 Deficiency of other specified B group vitamins: Secondary | ICD-10-CM

## 2017-06-24 DIAGNOSIS — D563 Thalassemia minor: Secondary | ICD-10-CM | POA: Diagnosis not present

## 2017-06-24 LAB — CBC WITH DIFFERENTIAL (CANCER CENTER ONLY)
BASOS ABS: 0 10*3/uL (ref 0.0–0.1)
BASOS PCT: 1 %
Eosinophils Absolute: 0.1 10*3/uL (ref 0.0–0.5)
Eosinophils Relative: 5 %
HEMATOCRIT: 25.1 % — AB (ref 38.4–49.9)
Hemoglobin: 7.9 g/dL — ABNORMAL LOW (ref 13.0–17.1)
LYMPHS PCT: 29 %
Lymphs Abs: 0.8 10*3/uL — ABNORMAL LOW (ref 0.9–3.3)
MCH: 23.9 pg — ABNORMAL LOW (ref 27.2–33.4)
MCHC: 31.5 g/dL — ABNORMAL LOW (ref 32.0–36.0)
MCV: 76.1 fL — ABNORMAL LOW (ref 79.3–98.0)
Monocytes Absolute: 0.3 10*3/uL (ref 0.1–0.9)
Monocytes Relative: 10 %
NEUTROS ABS: 1.5 10*3/uL (ref 1.5–6.5)
Neutrophils Relative %: 55 %
Platelet Count: 93 10*3/uL — ABNORMAL LOW (ref 140–400)
RBC: 3.3 MIL/uL — AB (ref 4.20–5.82)
RDW: 19.7 % — AB (ref 11.0–14.6)
WBC: 2.7 10*3/uL — AB (ref 4.0–10.3)
nRBC: 2 /100 WBC — ABNORMAL HIGH

## 2017-06-24 LAB — SAMPLE TO BLOOD BANK

## 2017-06-24 MED ORDER — EPOETIN ALFA 40000 UNIT/ML IJ SOLN
INTRAMUSCULAR | Status: AC
  Filled 2017-06-24: qty 1

## 2017-06-24 MED ORDER — EPOETIN ALFA 40000 UNIT/ML IJ SOLN
40000.0000 [IU] | Freq: Once | INTRAMUSCULAR | Status: AC
  Administered 2017-06-24: 40000 [IU] via SUBCUTANEOUS

## 2017-06-24 NOTE — Patient Instructions (Signed)
Epoetin Alfa injection What is this medicine? EPOETIN ALFA (e POE e tin AL fa) helps your body make more red blood cells. This medicine is used to treat anemia caused by chronic kidney failure, cancer chemotherapy, or HIV-therapy. It may also be used before surgery if you have anemia. This medicine may be used for other purposes; ask your health care provider or pharmacist if you have questions. COMMON BRAND NAME(S): Epogen, Procrit What should I tell my health care provider before I take this medicine? They need to know if you have any of these conditions: -blood clotting disorders -cancer patient not on chemotherapy -cystic fibrosis -heart disease, such as angina or heart failure -hemoglobin level of 12 g/dL or greater -high blood pressure -low levels of folate, iron, or vitamin B12 -seizures -an unusual or allergic reaction to erythropoietin, albumin, benzyl alcohol, hamster proteins, other medicines, foods, dyes, or preservatives -pregnant or trying to get pregnant -breast-feeding How should I use this medicine? This medicine is for injection into a vein or under the skin. It is usually given by a health care professional in a hospital or clinic setting. If you get this medicine at home, you will be taught how to prepare and give this medicine. Use exactly as directed. Take your medicine at regular intervals. Do not take your medicine more often than directed. It is important that you put your used needles and syringes in a special sharps container. Do not put them in a trash can. If you do not have a sharps container, call your pharmacist or healthcare provider to get one. A special MedGuide will be given to you by the pharmacist with each prescription and refill. Be sure to read this information carefully each time. Talk to your pediatrician regarding the use of this medicine in children. While this drug may be prescribed for selected conditions, precautions do apply. Overdosage: If you  think you have taken too much of this medicine contact a poison control center or emergency room at once. NOTE: This medicine is only for you. Do not share this medicine with others. What if I miss a dose? If you miss a dose, take it as soon as you can. If it is almost time for your next dose, take only that dose. Do not take double or extra doses. What may interact with this medicine? Do not take this medicine with any of the following medications: -darbepoetin alfa This list may not describe all possible interactions. Give your health care provider a list of all the medicines, herbs, non-prescription drugs, or dietary supplements you use. Also tell them if you smoke, drink alcohol, or use illegal drugs. Some items may interact with your medicine. What should I watch for while using this medicine? Your condition will be monitored carefully while you are receiving this medicine. You may need blood work done while you are taking this medicine. What side effects may I notice from receiving this medicine? Side effects that you should report to your doctor or health care professional as soon as possible: -allergic reactions like skin rash, itching or hives, swelling of the face, lips, or tongue -breathing problems -changes in vision -chest pain -confusion, trouble speaking or understanding -feeling faint or lightheaded, falls -high blood pressure -muscle aches or pains -pain, swelling, warmth in the leg -rapid weight gain -severe headaches -sudden numbness or weakness of the face, arm or leg -trouble walking, dizziness, loss of balance or coordination -seizures (convulsions) -swelling of the ankles, feet, hands -unusually weak or tired   Side effects that usually do not require medical attention (report to your doctor or health care professional if they continue or are bothersome): -diarrhea -fever, chills (flu-like symptoms) -headaches -nausea, vomiting -redness, stinging, or swelling at  site where injected This list may not describe all possible side effects. Call your doctor for medical advice about side effects. You may report side effects to FDA at 1-800-FDA-1088. Where should I keep my medicine? Keep out of the reach of children. Store in a refrigerator between 2 and 8 degrees C (36 and 46 degrees F). Do not freeze or shake. Throw away any unused portion if using a single-dose vial. Multi-dose vials can be kept in the refrigerator for up to 21 days after the initial dose. Throw away unused medicine. NOTE: This sheet is a summary. It may not cover all possible information. If you have questions about this medicine, talk to your doctor, pharmacist, or health care provider.  2018 Elsevier/Gold Standard (2015-09-03 19:42:31) Cyanocobalamin, Vitamin B12 injection What is this medicine? CYANOCOBALAMIN (sye an oh koe BAL a min) is a man made form of vitamin B12. Vitamin B12 is used in the growth of healthy blood cells, nerve cells, and proteins in the body. It also helps with the metabolism of fats and carbohydrates. This medicine is used to treat people who can not absorb vitamin B12. This medicine may be used for other purposes; ask your health care provider or pharmacist if you have questions. COMMON BRAND NAME(S): B-12 Compliance Kit, B-12 Injection Kit, Cyomin, LA-12, Nutri-Twelve, Physicians EZ Use B-12, Primabalt What should I tell my health care provider before I take this medicine? They need to know if you have any of these conditions: -kidney disease -Leber's disease -megaloblastic anemia -an unusual or allergic reaction to cyanocobalamin, cobalt, other medicines, foods, dyes, or preservatives -pregnant or trying to get pregnant -breast-feeding How should I use this medicine? This medicine is injected into a muscle or deeply under the skin. It is usually given by a health care professional in a clinic or doctor's office. However, your doctor may teach you how to inject  yourself. Follow all instructions. Talk to your pediatrician regarding the use of this medicine in children. Special care may be needed. Overdosage: If you think you have taken too much of this medicine contact a poison control center or emergency room at once. NOTE: This medicine is only for you. Do not share this medicine with others. What if I miss a dose? If you are given your dose at a clinic or doctor's office, call to reschedule your appointment. If you give your own injections and you miss a dose, take it as soon as you can. If it is almost time for your next dose, take only that dose. Do not take double or extra doses. What may interact with this medicine? -colchicine -heavy alcohol intake This list may not describe all possible interactions. Give your health care provider a list of all the medicines, herbs, non-prescription drugs, or dietary supplements you use. Also tell them if you smoke, drink alcohol, or use illegal drugs. Some items may interact with your medicine. What should I watch for while using this medicine? Visit your doctor or health care professional regularly. You may need blood work done while you are taking this medicine. You may need to follow a special diet. Talk to your doctor. Limit your alcohol intake and avoid smoking to get the best benefit. What side effects may I notice from receiving this medicine? Side effects   that you should report to your doctor or health care professional as soon as possible: -allergic reactions like skin rash, itching or hives, swelling of the face, lips, or tongue -blue tint to skin -chest tightness, pain -difficulty breathing, wheezing -dizziness -red, swollen painful area on the leg Side effects that usually do not require medical attention (report to your doctor or health care professional if they continue or are bothersome): -diarrhea -headache This list may not describe all possible side effects. Call your doctor for medical  advice about side effects. You may report side effects to FDA at 1-800-FDA-1088. Where should I keep my medicine? Keep out of the reach of children. Store at room temperature between 15 and 30 degrees C (59 and 85 degrees F). Protect from light. Throw away any unused medicine after the expiration date. NOTE: This sheet is a summary. It may not cover all possible information. If you have questions about this medicine, talk to your doctor, pharmacist, or health care provider.  2018 Elsevier/Gold Standard (2007-04-26 22:10:20)  

## 2017-06-25 ENCOUNTER — Telehealth: Payer: Self-pay | Admitting: *Deleted

## 2017-06-26 ENCOUNTER — Other Ambulatory Visit: Payer: Self-pay | Admitting: Nurse Practitioner

## 2017-06-26 ENCOUNTER — Other Ambulatory Visit: Payer: Self-pay | Admitting: *Deleted

## 2017-06-26 ENCOUNTER — Inpatient Hospital Stay: Payer: Medicare Other

## 2017-06-26 DIAGNOSIS — E538 Deficiency of other specified B group vitamins: Secondary | ICD-10-CM | POA: Diagnosis not present

## 2017-06-26 DIAGNOSIS — C9000 Multiple myeloma not having achieved remission: Secondary | ICD-10-CM

## 2017-06-26 DIAGNOSIS — D61810 Antineoplastic chemotherapy induced pancytopenia: Secondary | ICD-10-CM | POA: Diagnosis not present

## 2017-06-26 DIAGNOSIS — D563 Thalassemia minor: Secondary | ICD-10-CM | POA: Diagnosis not present

## 2017-06-26 DIAGNOSIS — D631 Anemia in chronic kidney disease: Secondary | ICD-10-CM | POA: Diagnosis not present

## 2017-06-26 DIAGNOSIS — N189 Chronic kidney disease, unspecified: Secondary | ICD-10-CM | POA: Diagnosis not present

## 2017-06-26 LAB — PREPARE RBC (CROSSMATCH)

## 2017-06-26 MED ORDER — SODIUM CHLORIDE 0.9 % IV SOLN: 250.0000 mL | Freq: Once | INTRAVENOUS | Status: DC

## 2017-06-26 NOTE — Patient Instructions (Signed)

## 2017-06-29 ENCOUNTER — Inpatient Hospital Stay (HOSPITAL_BASED_OUTPATIENT_CLINIC_OR_DEPARTMENT_OTHER): Payer: Medicare Other | Admitting: Nurse Practitioner

## 2017-06-29 ENCOUNTER — Inpatient Hospital Stay: Payer: Medicare Other

## 2017-06-29 ENCOUNTER — Inpatient Hospital Stay: Payer: Medicare Other | Attending: Oncology

## 2017-06-29 ENCOUNTER — Telehealth: Payer: Self-pay | Admitting: Oncology

## 2017-06-29 VITALS — BP 136/62 | HR 63 | Temp 98.4°F | Resp 17 | Ht 72.0 in | Wt 147.5 lb

## 2017-06-29 DIAGNOSIS — N189 Chronic kidney disease, unspecified: Secondary | ICD-10-CM | POA: Diagnosis not present

## 2017-06-29 DIAGNOSIS — D631 Anemia in chronic kidney disease: Secondary | ICD-10-CM

## 2017-06-29 DIAGNOSIS — C9000 Multiple myeloma not having achieved remission: Secondary | ICD-10-CM

## 2017-06-29 DIAGNOSIS — D61810 Antineoplastic chemotherapy induced pancytopenia: Secondary | ICD-10-CM | POA: Diagnosis not present

## 2017-06-29 LAB — BPAM RBC
BLOOD PRODUCT EXPIRATION DATE: 201906172359
ISSUE DATE / TIME: 201905311604
UNIT TYPE AND RH: 6200

## 2017-06-29 LAB — CBC WITH DIFFERENTIAL (CANCER CENTER ONLY)
Basophils Absolute: 0 10*3/uL (ref 0.0–0.1)
Basophils Relative: 1 %
EOS ABS: 0.1 10*3/uL (ref 0.0–0.5)
EOS PCT: 3 %
HCT: 32.7 % — ABNORMAL LOW (ref 38.4–49.9)
HEMOGLOBIN: 10.2 g/dL — AB (ref 13.0–17.1)
LYMPHS ABS: 0.9 10*3/uL (ref 0.9–3.3)
Lymphocytes Relative: 26 %
MCH: 24.1 pg — AB (ref 27.2–33.4)
MCHC: 31.2 g/dL — ABNORMAL LOW (ref 32.0–36.0)
MCV: 77.3 fL — ABNORMAL LOW (ref 79.3–98.0)
MONOS PCT: 8 %
Monocytes Absolute: 0.3 10*3/uL (ref 0.1–0.9)
Neutro Abs: 2.3 10*3/uL (ref 1.5–6.5)
Neutrophils Relative %: 62 %
PLATELETS: 97 10*3/uL — AB (ref 140–400)
RBC: 4.23 MIL/uL (ref 4.20–5.82)
RDW: 20 % — ABNORMAL HIGH (ref 11.0–14.6)
WBC Count: 3.6 10*3/uL — ABNORMAL LOW (ref 4.0–10.3)

## 2017-06-29 LAB — SAMPLE TO BLOOD BANK

## 2017-06-29 LAB — TYPE AND SCREEN
ABO/RH(D): A POS
ANTIBODY SCREEN: NEGATIVE
UNIT DIVISION: 0

## 2017-06-29 MED ORDER — EPOETIN ALFA 40000 UNIT/ML IJ SOLN
INTRAMUSCULAR | Status: AC
  Filled 2017-06-29: qty 1

## 2017-06-29 MED ORDER — EPOETIN ALFA 40000 UNIT/ML IJ SOLN: 40000.0000 [IU] | Freq: Once | INTRAMUSCULAR | Status: DC

## 2017-06-29 NOTE — Telephone Encounter (Signed)
Appointments scheduled by daughter she did not wait for schedule to be printed per 6/3 los

## 2017-06-29 NOTE — Progress Notes (Signed)
  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  He began a trial of weekly erythropoietin 06/17/2017.  He was transfused 1 unit of blood 06/26/2017.  He feels better since the blood transfusion.  Appetite varies.  Bowels are moving.  No fever.  He intermittently feels chilled.  No significant dyspnea.  Objective:  Vital signs in last 24 hours:  Blood pressure 136/62, pulse 63, temperature 98.4 F (36.9 C), temperature source Oral, resp. rate 17, height 6' (1.829 m), weight 147 lb 8 oz (66.9 kg), SpO2 97 %.    HEENT: No thrush or ulcers. Resp: Distant breath sounds. Cardio: Distant heart sounds. GI: Abdomen soft and nontender.  No hepatosplenomegaly. Vascular: No leg edema.  Chronic stasis change.   Lab Results:  Lab Results  Component Value Date   WBC 3.6 (L) 06/29/2017   HGB 10.2 (L) 06/29/2017   HCT 32.7 (L) 06/29/2017   MCV 77.3 (L) 06/29/2017   PLT 97 (L) 06/29/2017   NEUTROABS 2.3 06/29/2017    Imaging:  No results found.  Medications: I have reviewed the patient's current medications.  Assessment/Plan: 1. Pancytopenia-transfused with packed red blood cells 11/28/2016, 12/27/2016, 01/13/2017, 02/02/2017 2. History of beta thalassemia trait 3. History of vitamin B-12 deficiency-vitamin B-12 replacement initiated 11/11/2016 4. History of a serum monoclonal IgAkappaprotein-elevated serum free kappa light chains, IgA, and M spike10/16/2018  Bone marrow biopsy 12/03/2016-consistent with multiple myeloma  Bone survey 12/06/2016-no destructive or lytic bone lesions identified.  Revlimid 21 days on/7 days off and weekly dexamethasone 12/16/2016(Decadron discontinued 12/29/2016 secondary to altered mental status)  Cycle 2 single agent Revlimid 01/14/2017(discontinued 3 days early)  Serum light chains, IgAimproved 02/02/2017  Cycle3 single agent Revlimid beginning  02/16/2017  Cycle4single agent Revlimid beginning 03/16/2017  Cycle 5 single agent Revlimid beginning 04/14/2017(Revlimid placed on hold beginning 04/28/2017 due to thrombocytopenia)  Cycle 6 single agent Revlimid beginning 05/23/2017 x 14 days 5. COPD with acute and chronic respiratory failure/hypoxemia 6. History of coronary artery disease 7. Ischemic cardiomyopathy 8. ICD in place 9. Prostatic hypertrophy 10.Inappropriately low erythropoietin level 11/28/2016 11. Severe pancytopenia secondary to multiple myeloma and Revlimid 12. Fever-likely related to an upper respiratory infection, RSV positive swab 02/02/2017 13.  Anemia secondary to renal insufficiency-trial of weekly erythropoietin initiated 06/17/2017.    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  He remains off of specific therapy for the myeloma.  He began a trial of weekly erythropoietin 06/17/2017.  He was transfused a unit of blood 06/26/2017.  Hemoglobin today is improved.  Plan to continue erythropoietin, next injection 07/06/2017.  He will return for lab and follow-up 07/13/2017.  He will contact the office in the interim with any problems.    Patient seen with Dr. [name].    Ned Card ANP/GNP-BC   06/29/2017  3:46 PM  This was a shared visit with Ned Card.  The hemoglobin and platelet count are improved today.  He received 1 unit of red blood cells last week and has started a trial of erythropoietin.  The myeloma markers have not improved significantly with Revlimid and he has developed significant cytopenias while on Revlimid.  We decided to discontinue Revlimid and follow him off of specific therapy for myeloma.  He does not wish to consider any treatment that could cause neuropathy.  He will continue a trial of erythropoietin.  Julieanne Manson, MD

## 2017-06-29 NOTE — Progress Notes (Signed)
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 Johnsie Cancel Electrophysiologist: Dr. 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  E.Coli Bacteremia May 2017 2/2 pyelonephritis, f/u cultures neg x2, no EP/cards eval.  He denies any recurrent infection.  He comes today in a scooter, says at his home he uses a wheelchair 2/2 bad knees, and for the most part non-ambulatory.  He denies any CP, palpitations or rest SOB.  He denies any symptoms of PND or orthopnea.  No dizziness, near syncope or syncope.  He mentions that his oncologist stopped his plavix.   Device information: SJM  Dual chamber ICD, implanted 12/13/09 I re-discussed [employer] Fortify [employer]ura advisary with the patient today. He understands that recommendation from S[employer] is to not replace the device at this time. The patient is not device dependant.  The patient has had appropriate device therapy in the past or implanted for secondary prevention. Vibratory alert was re-demonstrated today, though difficult for him to perceive.  He is actively being remotely monitored and understands the importance of compliance today  Past Medical History:  Diagnosis Date  . Anemia, unspecified   . CAD (coronary artery disease)   . Cataracts, bilateral   . CHF (congestive heart failure) ([place])   . Chronic airway obstruction, not elsewhere classified    on o2 at night  . Diabetes mellitus   . Diverticulosis of colon (without mention of hemorrhage)   .  Dyslipidemia   . Emphysema   . Hemorrhoids   . ICD (implantable cardiac defibrillator) in place    st jude  . Ischemic cardiomyopathy   . Macular degeneration   . MI, old 2010 or 2011  . Prostatic hypertrophy    hx of  . Retention of urine, unspecified   . Unspecified disorder resulting from impaired renal function     Past Surgical History:  Procedure Laterality Date  . CARDIAC DEFIBRILLATOR PLACEMENT  2011   [facility] jude  . CATARACT EXTRACTION    . PACEMAKER INSERTION  2011  . pci     4/11  . TONSILLECTOMY  84 yo    Current Outpatient Medications  Medication Sig Dispense Refill  . albuterol (PROAIR HFA) 108 (90 Base) MCG/ACT inhaler Inhale 2 puffs into the lungs every 4 (four) hours as needed for wheezing or shortness of breath. 1 Inhaler 3  . albuterol (PROVENTIL) (2.5 MG/3ML) 0.083% nebulizer solution Take 3 mLs (2.5 mg total) every 2 (two) hours as needed by nebulization for wheezing or shortness of breath. 75 mL 0  . ALPRAZolam (XANAX) 0.25 MG tablet Take 1 tablet (0.25 mg total) by mouth at bedtime as needed for sleep. 30 tablet 0  . arformoterol (BROVANA) 15 MCG/2ML NEBU Take 15 mcg by nebulization 2 (two) times daily.    Marland Kitchen aspirin EC 81 MG tablet Take 1 tablet (81 mg total) by mouth at bedtime. 30 tablet 0  . bisacodyl (DULCOLAX) 5 MG EC tablet Take 5 mg by mouth daily as needed for moderate constipation.    Marland Kitchen  budesonide (PULMICORT) 0.5 MG/2ML nebulizer solution Take 2 mLs (0.5 mg total) by nebulization 2 (two) times daily. 120 mL 3  . ferrous sulfate 325 (65 FE) MG tablet Take 1 tablet (325 mg total) by mouth 2 (two) times daily with a meal. 60 tablet 0  . fluticasone (FLONASE) 50 MCG/ACT nasal spray Place 1 spray into both nostrils daily. (Patient taking differently: Place 1 spray into both nostrils daily as needed for allergies. ) 16 g 2  . guaiFENesin (MUCINEX) 600 MG 12 hr tablet Take 600 mg by mouth 2 (two) times daily as needed for cough or to loosen phlegm.     .  loratadine (CLARITIN) 10 MG tablet Take 1 tablet (10 mg total) by mouth daily. 30 tablet 1  . metoprolol tartrate (LOPRESSOR) 25 MG tablet Take 12.5 mg by mouth 2 (two) times daily. Reported on 06/06/2015    . nitroGLYCERIN (NITROSTAT) 0.4 MG SL tablet Place 1 tablet (0.4 mg total) under the tongue every 5 (five) minutes as needed for chest pain. UP TO 3 DOSES THEN CALL EMS 30 tablet 1  . OXYGEN Inhale 2 L into the lungs at bedtime.     . pantoprazole (PROTONIX) 40 MG tablet Take 1 tablet (40 mg total) by mouth daily. 30 tablet 0  . rosuvastatin (CRESTOR) 5 MG tablet Take 1 tablet (5 mg total) by mouth daily at 6 PM. (Patient taking differently: Take 5 mg by mouth at bedtime. ) 90 tablet 3  . sodium chloride (OCEAN) 0.65 % SOLN nasal spray Place 2 sprays into both nostrils 2 (two) times daily.  0  . tobramycin (TOBREX) 0.3 % ophthalmic solution     . trimethoprim (TRIMPEX) 100 MG tablet Take 1 tablet (100 mg total) daily by mouth.    . vitamin B-12 1000 MCG tablet Take 1 tablet (1,000 mcg total) daily by mouth.    . vitamin C (VITAMIN C) 250 MG tablet Take 1 tablet (250 mg total) by mouth 2 (two) times daily. 60 tablet 0   No current facility-administered medications for this visit.    Facility-Administered Medications Ordered in Other Visits  Medication Dose Route Frequency Provider Last Rate Last Dose  . 0.9 %  sodium chloride infusion  250 mL Intravenous Once Ladell Pier, MD        Allergies:   Patient has no known allergies.   Social History:  The patient  reports that he quit smoking about 8 years ago. His smoking use included cigarettes. He has never used smokeless tobacco. He reports that he drinks alcohol. He reports that he does not use drugs.   Family History:  The patient's family history includes Atrial fibrillation in his daughter; Breast cancer in his sister; Congestive Heart Failure in his mother; Coronary artery disease in his mother; Heart attack in his brother; Lung  cancer in his father; Stroke in his mother.  ROS:  Please see the history of present illness.  All other systems are reviewed and otherwise negative.   PHYSICAL EXAM:  VS:  BP 110/66   Pulse 73   Ht 6' (1.829 m)   Wt 147 lb (66.7 kg)   BMI 19.94 kg/m  BMI: Body mass index is 19.94 kg/m. Well nourished, well developed, in no acute distress  HEENT: normocephalic, atraumatic  Neck: no JVD, carotid bruits or masses Cardiac:  RRR; no significant murmurs, no rubs, or gallops Lungs:  CTA b/l, no wheezing, rhonchi or rales  Abd: soft, nontender MS: no deformity,  advanced atrophy Ext: trace edema  Skin: warm and dry, no rash Neuro:  No gross deficits appreciated Psych: euthymic mood, full affect  ICD site is stable, no tethering or discomfort   EKG:  Not done today ICD interrogation done today and reviewed by myself: battery and lead measurements are good, <1 AP, < VP, no episodes, cyber security upgrade was completed   06/06/15: TTE Study Conclusions - Left ventricle: The cavity size was normal. Wall thickness was   increased in a pattern of mild LVH. Systolic function was normal.   The estimated ejection fraction was in the range of 55% to 60%.   Wall motion was normal; there were no regional wall motion   abnormalities. Doppler parameters are consistent with abnormal   left ventricular relaxation (grade 1 diastolic dysfunction). The   E/e&' ratio is between 8-15, suggesting indeterminate LV filling   pressure. - Aortic valve: Mildly calcified leaflets. Mild stenosis. There was   no regurgitation. Mean gradient (S): 12 mm Hg. Peak gradient (S):   24 mm Hg. Valve area (VTI): 1.78 cm^2. Valve area (Vmax): 1.73   cm^2. Valve area (Vmean): 1.75 cm^2. - Mitral valve: Calcified annulus. Mildly thickened leaflets .   There was mild regurgitation. - Left atrium: The atrium was normal in size. - Right ventricle: The cavity size was normal. Pacer wire or   catheter noted in right  ventricle. Systolic function was normal. - Right atrium: The atrium was normal in size. Pacer wire or   catheter noted in right atrium. - Inferior vena cava: The vessel was normal in size. The   respirophasic diameter changes were in the normal range (>= 50%),   consistent with normal central venous pressure. Impressions: - Compared to a prior echo in 2015, the LVEF has improved to   55-60%. AICD wires are present. There is mild aortic stenosis   with AVA around 1.7-1.8 cm2.   LHC in 2011: PCI/DES to RI, dLCx and RCA.   2D ECHO: 11/15/2013 LV EF: 45% -  50% Study Conclusions - Left ventricle: Distal septal and apical hypokinesis. The cavity size was mildly dilated. Systolic function was mildly reduced. The estimated ejection fraction was in the range of 45% to 50%. - Aortic valve: Small systolic gradient without significant stenosis Valve area (VTI): 1.8 cm^2. Valve area (Vmax): 1.72 cm^2. - Mitral valve: Calcified annulus. Mildly thickened leaflets . - Left atrium: The atrium was mildly dilated. - Atrial septum: No defect or patent foramen ovale was identified. - Pulmonary arteries: PA peak pressure: 40 mm Hg (S).    Recent Labs: 11/12/2016: Magnesium 2.1 11/27/2016: B Natriuretic Peptide 124.2 06/15/2017: ALT 8; BUN 33; Creatinine 1.30; Potassium 3.5; Sodium 143 06/29/2017: Hemoglobin 10.2; Platelet Count 97  No results found for requested labs within last 8760 hours.   Estimated Creatinine Clearance: 35.6 mL/min (by C-G formula based on SCr of 1.3 mg/dL).   Wt Readings from Last 3 Encounters:  06/30/17 147 lb (66.7 kg)  06/29/17 147 lb 8 oz (66.9 kg)  06/15/17 151 lb 14.4 oz (68.9 kg)     Other studies reviewed: Additional studies/records reviewed today include: summarized above  ASSESSMENT AND PLAN:  1. ICD     Device advisory rediscussed     Intact function  2. CAD     No anginal complaints     Due to see Dr. Johnsie Cancel     On ASA, BB, statin  3.  ICM     2017 echo noted improved/normalized  LVEF     No exam findings or symptoms to suggest fluid OL     Disposition: F/u with Q 3 mo remotes, in-clinic in 1 year, sooner if needed.  Current medicines are reviewed at length with the patient today.  The patient did not have any concerns regarding medicines.  Venetia Night, PA-C 06/30/2017 3:07 PM     [place]  [phone] (office)  [phone] (fax)

## 2017-06-30 ENCOUNTER — Ambulatory Visit (INDEPENDENT_AMBULATORY_CARE_PROVIDER_SITE_OTHER): Payer: Medicare Other | Admitting: Physician Assistant

## 2017-06-30 VITALS — BP 110/66 | HR 73 | Ht 72.0 in | Wt 147.0 lb

## 2017-06-30 DIAGNOSIS — I2510 Atherosclerotic heart disease of native coronary artery without angina pectoris: Secondary | ICD-10-CM

## 2017-06-30 DIAGNOSIS — I255 Ischemic cardiomyopathy: Secondary | ICD-10-CM

## 2017-06-30 DIAGNOSIS — Z95810 Presence of automatic (implantable) cardiac defibrillator: Secondary | ICD-10-CM | POA: Diagnosis not present

## 2017-06-30 NOTE — Patient Instructions (Addendum)
Medication Instructions:   Your physician recommends that you continue on your current medications as directed. Please refer to the Current Medication list given to you today.   If you need a refill on your cardiac medications before your next appointment, please call your pharmacy.  Labwork: NONE ORDERED  TODAY    Testing/Procedures: NONE ORDERED  TODAY    Follow-Up: WITH DR Ware Place   Your physician wants you to follow-up in: [place] will receive a reminder letter in the mail two months in advance. If you don't receive a letter, please call our office to schedule the follow-up appointment.  Remote monitoring is used to monitor your Pacemaker of ICD from home. This monitoring reduces the number of office visits required to check your device to one time per year. It allows [place] to keep an eye on the functioning of your device to ensure it is working properly. You are scheduled for a device check from home on .  08-24-17. You may send your transmission at any time that day. If you have a wireless device, the transmission will be sent automatically. After your physician reviews your transmission, you will receive a postcard with your next transmission date.      Any Other Special Instructions Will Be Listed Below (If Applicable).

## 2017-07-01 DIAGNOSIS — H2511 Age-related nuclear cataract, right eye: Secondary | ICD-10-CM | POA: Diagnosis not present

## 2017-07-01 DIAGNOSIS — H353112 Nonexudative age-related macular degeneration, right eye, intermediate dry stage: Secondary | ICD-10-CM | POA: Diagnosis not present

## 2017-07-01 DIAGNOSIS — H353221 Exudative age-related macular degeneration, left eye, with active choroidal neovascularization: Secondary | ICD-10-CM | POA: Diagnosis not present

## 2017-07-01 DIAGNOSIS — H43813 Vitreous degeneration, bilateral: Secondary | ICD-10-CM | POA: Diagnosis not present

## 2017-07-06 ENCOUNTER — Inpatient Hospital Stay: Payer: Medicare Other | Admitting: Nurse Practitioner

## 2017-07-06 ENCOUNTER — Inpatient Hospital Stay: Payer: Medicare Other

## 2017-07-06 VITALS — BP 113/63 | HR 70 | Temp 98.1°F | Resp 18

## 2017-07-06 DIAGNOSIS — D61810 Antineoplastic chemotherapy induced pancytopenia: Secondary | ICD-10-CM | POA: Diagnosis not present

## 2017-07-06 DIAGNOSIS — N189 Chronic kidney disease, unspecified: Secondary | ICD-10-CM | POA: Diagnosis not present

## 2017-07-06 DIAGNOSIS — C9000 Multiple myeloma not having achieved remission: Secondary | ICD-10-CM | POA: Diagnosis not present

## 2017-07-06 DIAGNOSIS — E538 Deficiency of other specified B group vitamins: Secondary | ICD-10-CM

## 2017-07-06 DIAGNOSIS — D631 Anemia in chronic kidney disease: Secondary | ICD-10-CM | POA: Diagnosis not present

## 2017-07-06 DIAGNOSIS — D509 Iron deficiency anemia, unspecified: Secondary | ICD-10-CM

## 2017-07-06 LAB — CBC WITH DIFFERENTIAL/PLATELET
BASOS ABS: 0 10*3/uL (ref 0.0–0.1)
BASOS PCT: 1 %
EOS ABS: 0.2 10*3/uL (ref 0.0–0.5)
Eosinophils Relative: 5 %
HCT: 31.6 % — ABNORMAL LOW (ref 38.4–49.9)
HEMOGLOBIN: 9.9 g/dL — AB (ref 13.0–17.1)
Lymphocytes Relative: 27 %
Lymphs Abs: 1.1 10*3/uL (ref 0.9–3.3)
MCH: 24.2 pg — ABNORMAL LOW (ref 27.2–33.4)
MCHC: 31.3 g/dL — AB (ref 32.0–36.0)
MCV: 77.1 fL — ABNORMAL LOW (ref 79.3–98.0)
MONOS PCT: 7 %
Monocytes Absolute: 0.3 10*3/uL (ref 0.1–0.9)
NEUTROS ABS: 2.6 10*3/uL (ref 1.5–6.5)
NEUTROS PCT: 60 %
Platelets: 99 10*3/uL — ABNORMAL LOW (ref 140–400)
RBC: 4.09 MIL/uL — ABNORMAL LOW (ref 4.20–5.82)
RDW: 20.8 % — AB (ref 11.0–14.6)
WBC: 4.2 10*3/uL (ref 4.0–10.3)

## 2017-07-06 LAB — SAMPLE TO BLOOD BANK

## 2017-07-06 MED ORDER — EPOETIN ALFA 40000 UNIT/ML IJ SOLN
INTRAMUSCULAR | Status: AC
  Filled 2017-07-06: qty 1

## 2017-07-06 MED ORDER — EPOETIN ALFA 40000 UNIT/ML IJ SOLN
40000.0000 [IU] | Freq: Once | INTRAMUSCULAR | Status: AC
  Administered 2017-07-06: 40000 [IU] via SUBCUTANEOUS

## 2017-07-13 ENCOUNTER — Inpatient Hospital Stay (HOSPITAL_BASED_OUTPATIENT_CLINIC_OR_DEPARTMENT_OTHER): Payer: Medicare Other | Admitting: Nurse Practitioner

## 2017-07-13 ENCOUNTER — Inpatient Hospital Stay: Payer: Medicare Other

## 2017-07-13 ENCOUNTER — Telehealth: Payer: Self-pay | Admitting: Oncology

## 2017-07-13 VITALS — BP 141/72 | HR 79 | Temp 97.8°F | Resp 18 | Ht 72.0 in

## 2017-07-13 DIAGNOSIS — N189 Chronic kidney disease, unspecified: Secondary | ICD-10-CM

## 2017-07-13 DIAGNOSIS — D631 Anemia in chronic kidney disease: Secondary | ICD-10-CM

## 2017-07-13 DIAGNOSIS — D61810 Antineoplastic chemotherapy induced pancytopenia: Secondary | ICD-10-CM

## 2017-07-13 DIAGNOSIS — C9000 Multiple myeloma not having achieved remission: Secondary | ICD-10-CM | POA: Diagnosis not present

## 2017-07-13 DIAGNOSIS — E538 Deficiency of other specified B group vitamins: Secondary | ICD-10-CM

## 2017-07-13 LAB — CBC WITH DIFFERENTIAL (CANCER CENTER ONLY)
Basophils Absolute: 0 10*3/uL (ref 0.0–0.1)
Basophils Relative: 1 %
EOS ABS: 0.2 10*3/uL (ref 0.0–0.5)
EOS PCT: 5 %
HCT: 27.9 % — ABNORMAL LOW (ref 38.4–49.9)
Hemoglobin: 8.7 g/dL — ABNORMAL LOW (ref 13.0–17.1)
LYMPHS ABS: 0.9 10*3/uL (ref 0.9–3.3)
LYMPHS PCT: 19 %
MCH: 23.9 pg — AB (ref 27.2–33.4)
MCHC: 31.4 g/dL — AB (ref 32.0–36.0)
MCV: 76 fL — ABNORMAL LOW (ref 79.3–98.0)
MONO ABS: 0.3 10*3/uL (ref 0.1–0.9)
MONOS PCT: 7 %
Neutro Abs: 3.2 10*3/uL (ref 1.5–6.5)
Neutrophils Relative %: 68 %
PLATELETS: 86 10*3/uL — AB (ref 140–400)
RBC: 3.66 MIL/uL — AB (ref 4.20–5.82)
RDW: 20.5 % — ABNORMAL HIGH (ref 11.0–14.6)
WBC Count: 4.6 10*3/uL (ref 4.0–10.3)

## 2017-07-13 MED ORDER — EPOETIN ALFA 40000 UNIT/ML IJ SOLN
40000.0000 [IU] | Freq: Once | INTRAMUSCULAR | Status: AC
  Administered 2017-07-13: 40000 [IU] via SUBCUTANEOUS

## 2017-07-13 MED ORDER — EPOETIN ALFA 40000 UNIT/ML IJ SOLN
INTRAMUSCULAR | Status: AC
  Filled 2017-07-13: qty 1

## 2017-07-13 NOTE — Telephone Encounter (Signed)
Appointments scheduled AVS/Calendar printed per 6/17 los °

## 2017-07-13 NOTE — Patient Instructions (Signed)
Epoetin Alfa injection °What is this medicine? °EPOETIN ALFA (e POE e tin AL fa) helps your body make more red blood cells. This medicine is used to treat anemia caused by chronic kidney failure, cancer chemotherapy, or HIV-therapy. It may also be used before surgery if you have anemia. °This medicine may be used for other purposes; ask your health care provider or pharmacist if you have questions. °COMMON BRAND NAME(S): Epogen, Procrit °What should I tell my health care provider before I take this medicine? °They need to know if you have any of these conditions: °-blood clotting disorders °-cancer patient not on chemotherapy °-cystic fibrosis °-heart disease, such as angina or heart failure °-hemoglobin level of 12 g/dL or greater °-high blood pressure °-low levels of folate, iron, or vitamin B12 °-seizures °-an unusual or allergic reaction to erythropoietin, albumin, benzyl alcohol, hamster proteins, other medicines, foods, dyes, or preservatives °-pregnant or trying to get pregnant °-breast-feeding °How should I use this medicine? °This medicine is for injection into a vein or under the skin. It is usually given by a health care professional in a hospital or clinic setting. °If you get this medicine at home, you will be taught how to prepare and give this medicine. Use exactly as directed. Take your medicine at regular intervals. Do not take your medicine more often than directed. °It is important that you put your used needles and syringes in a special sharps container. Do not put them in a trash can. If you do not have a sharps container, call your pharmacist or healthcare provider to get one. °A special MedGuide will be given to you by the pharmacist with each prescription and refill. Be sure to read this information carefully each time. °Talk to your pediatrician regarding the use of this medicine in children. While this drug may be prescribed for selected conditions, precautions do apply. °Overdosage: If you  think you have taken too much of this medicine contact a poison control center or emergency room at once. °NOTE: This medicine is only for you. Do not share this medicine with others. °What if I miss a dose? °If you miss a dose, take it as soon as you can. If it is almost time for your next dose, take only that dose. Do not take double or extra doses. °What may interact with this medicine? °Do not take this medicine with any of the following medications: °-darbepoetin alfa °This list may not describe all possible interactions. Give your health care provider a list of all the medicines, herbs, non-prescription drugs, or dietary supplements you use. Also tell them if you smoke, drink alcohol, or use illegal drugs. Some items may interact with your medicine. °What should I watch for while using this medicine? °Your condition will be monitored carefully while you are receiving this medicine. °You may need blood work done while you are taking this medicine. °What side effects may I notice from receiving this medicine? °Side effects that you should report to your doctor or health care professional as soon as possible: °-allergic reactions like skin rash, itching or hives, swelling of the face, lips, or tongue °-breathing problems °-changes in vision °-chest pain °-confusion, trouble speaking or understanding °-feeling faint or lightheaded, falls °-high blood pressure °-muscle aches or pains °-pain, swelling, warmth in the leg °-rapid weight gain °-severe headaches °-sudden numbness or weakness of the face, arm or leg °-trouble walking, dizziness, loss of balance or coordination °-seizures (convulsions) °-swelling of the ankles, feet, hands °-unusually weak or tired °  Side effects that usually do not require medical attention (report to your doctor or health care professional if they continue or are bothersome): °-diarrhea °-fever, chills (flu-like symptoms) °-headaches °-nausea, vomiting °-redness, stinging, or swelling at  site where injected °This list may not describe all possible side effects. Call your doctor for medical advice about side effects. You may report side effects to FDA at 1-800-FDA-1088. °Where should I keep my medicine? °Keep out of the reach of children. °Store in a refrigerator between 2 and 8 degrees C (36 and 46 degrees F). Do not freeze or shake. Throw away any unused portion if using a single-dose vial. Multi-dose vials can be kept in the refrigerator for up to 21 days after the initial dose. Throw away unused medicine. °NOTE: This sheet is a summary. It may not cover all possible information. If you have questions about this medicine, talk to your doctor, pharmacist, or health care provider. °© 2018 [employer]vier/Gold Standard (2015-09-03 19:42:31) ° °

## 2017-07-13 NOTE — Progress Notes (Addendum)
  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  He denies bleeding.  He continues to note easy bruising.  He denies shortness of breath.  He had a fall recently when transferring from the riding lawnmower to a chair.  He does not feel he needs a blood transfusion.  Objective:  Vital signs in last 24 hours:  Blood pressure (!) 141/72, pulse 79, temperature 97.8 F (36.6 C), temperature source Oral, resp. rate 18, height 6' (1.829 m), SpO2 97 %.    HEENT: No thrush or ulcers. Resp: Lungs clear bilaterally. Cardio: Regular rate and rhythm. GI: Abdomen soft and nontender.  No hepatomegaly. Vascular: No leg edema.  Skin: Ecchymoses scattered over the hands/forearms.   Lab Results:  Lab Results  Component Value Date   WBC 4.6 07/13/2017   HGB 8.7 (L) 07/13/2017   HCT 27.9 (L) 07/13/2017   MCV 76.0 (L) 07/13/2017   PLT 86 (L) 07/13/2017   NEUTROABS 3.2 07/13/2017    Imaging:  No results found.  Medications: I have reviewed the patient's current medications.  Assessment/Plan: 1. Pancytopenia-transfused with packed red blood cells 11/28/2016, 12/27/2016, 01/13/2017, 02/02/2017 2. History of beta thalassemia trait 3. History of vitamin B-12 deficiency-vitamin B-12 replacement initiated 11/11/2016 4. History of a serum monoclonal IgAkappaprotein-elevated serum free kappa light chains, IgA, and M spike10/16/2018  Bone marrow biopsy 12/03/2016-consistent with multiple myeloma  Bone survey 12/06/2016-no destructive or lytic bone lesions identified.  Revlimid 21 days on/7 days off and weekly dexamethasone 12/16/2016(Decadron discontinued 12/29/2016 secondary to altered mental status)  Cycle 2 single agent Revlimid 01/14/2017(discontinued 3 days early)  Serum light chains, IgAimproved 02/02/2017  Cycle3 single agent Revlimid beginning 02/16/2017  Cycle4single agent Revlimid beginning  03/16/2017  Cycle 5 single agent Revlimid beginning 04/14/2017(Revlimid placed on hold beginning 04/28/2017 due to thrombocytopenia)  Cycle 6 single agent Revlimid beginning 05/23/2017 x 14 days 5. COPD with acute and chronic respiratory failure/hypoxemia 6. History of coronary artery disease 7. Ischemic cardiomyopathy 8. ICD in place 9. Prostatic hypertrophy 10.Inappropriately low erythropoietin level 11/28/2016 11. Severe pancytopenia secondary to multiple myeloma and Revlimid 12. Fever-likely related to an upper respiratory infection, RSV positive swab 02/02/2017 13.  Anemia secondary to renal insufficiency-trial of weekly erythropoietin initiated 06/17/2017.   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 Treatment of the myeloma remains on hold.  We will obtain repeat myeloma labs and discuss treatment options further at the time of his next visit in 2 weeks.  We reviewed the CBC from today.  He has mild progression of the anemia.  He appears asymptomatic.  Plan to continue weekly erythropoietin.  He will return for lab and follow-up in 2 weeks.  He will contact the office in the interim with any problems.  Patient seen with Dr. [name].    Ned Card ANP/GNP-BC   07/13/2017  3:23 PM  This was a shared visit with Ned Card.  The hemoglobin has drifted down over the past few weeks despite erythropoietin.  We decided to continue erythropoietin.  We will consider an increased dose of erythropoietin if the hemoglobin continues to fall. He remains reluctant to consider myeloma treatment that could cause neuropathy.  Julieanne Manson, MD

## 2017-07-17 ENCOUNTER — Ambulatory Visit: Payer: Medicare Other | Admitting: Pulmonary Disease

## 2017-07-17 DIAGNOSIS — D61818 Other pancytopenia: Secondary | ICD-10-CM | POA: Diagnosis not present

## 2017-07-17 DIAGNOSIS — N401 Enlarged prostate with lower urinary tract symptoms: Secondary | ICD-10-CM | POA: Diagnosis not present

## 2017-07-17 DIAGNOSIS — N183 Chronic kidney disease, stage 3 (moderate): Secondary | ICD-10-CM | POA: Diagnosis not present

## 2017-07-17 DIAGNOSIS — I252 Old myocardial infarction: Secondary | ICD-10-CM | POA: Diagnosis not present

## 2017-07-17 DIAGNOSIS — I10 Essential (primary) hypertension: Secondary | ICD-10-CM | POA: Diagnosis not present

## 2017-07-17 DIAGNOSIS — E1122 Type 2 diabetes mellitus with diabetic chronic kidney disease: Secondary | ICD-10-CM | POA: Diagnosis not present

## 2017-07-17 DIAGNOSIS — E119 Type 2 diabetes mellitus without complications: Secondary | ICD-10-CM | POA: Diagnosis not present

## 2017-07-17 DIAGNOSIS — I2510 Atherosclerotic heart disease of native coronary artery without angina pectoris: Secondary | ICD-10-CM | POA: Diagnosis not present

## 2017-07-17 DIAGNOSIS — E782 Mixed hyperlipidemia: Secondary | ICD-10-CM | POA: Diagnosis not present

## 2017-07-17 DIAGNOSIS — J449 Chronic obstructive pulmonary disease, unspecified: Secondary | ICD-10-CM | POA: Diagnosis not present

## 2017-07-17 DIAGNOSIS — J441 Chronic obstructive pulmonary disease with (acute) exacerbation: Secondary | ICD-10-CM | POA: Diagnosis not present

## 2017-07-20 ENCOUNTER — Other Ambulatory Visit: Payer: Self-pay | Admitting: Internal Medicine

## 2017-07-20 ENCOUNTER — Ambulatory Visit
Admission: RE | Admit: 2017-07-20 | Discharge: 2017-07-20 | Disposition: A | Discharge status: Home / Self Care | Payer: Medicare Other | Source: Ambulatory Visit | Attending: Internal Medicine | Admitting: Internal Medicine

## 2017-07-20 DIAGNOSIS — M25551 Pain in right hip: Secondary | ICD-10-CM | POA: Diagnosis not present

## 2017-07-20 DIAGNOSIS — R0781 Pleurodynia: Secondary | ICD-10-CM

## 2017-07-20 DIAGNOSIS — M549 Dorsalgia, unspecified: Secondary | ICD-10-CM

## 2017-07-20 DIAGNOSIS — S3992XA Unspecified injury of lower back, initial encounter: Secondary | ICD-10-CM | POA: Diagnosis not present

## 2017-07-20 DIAGNOSIS — S299XXA Unspecified injury of thorax, initial encounter: Secondary | ICD-10-CM | POA: Diagnosis not present

## 2017-07-20 DIAGNOSIS — S79911A Unspecified injury of right hip, initial encounter: Secondary | ICD-10-CM | POA: Diagnosis not present

## 2017-07-22 ENCOUNTER — Ambulatory Visit: Payer: Medicare Other | Admitting: Pulmonary Disease

## 2017-07-28 ENCOUNTER — Inpatient Hospital Stay: Payer: Medicare Other

## 2017-07-28 ENCOUNTER — Inpatient Hospital Stay: Payer: Medicare Other | Attending: Oncology

## 2017-07-28 ENCOUNTER — Inpatient Hospital Stay (HOSPITAL_BASED_OUTPATIENT_CLINIC_OR_DEPARTMENT_OTHER): Payer: Medicare Other | Admitting: Nurse Practitioner

## 2017-07-28 ENCOUNTER — Telehealth: Payer: Self-pay | Admitting: Oncology

## 2017-07-28 ENCOUNTER — Encounter: Payer: Self-pay | Admitting: Nurse Practitioner

## 2017-07-28 VITALS — BP 136/78 | HR 70 | Temp 98.7°F | Resp 17 | Ht 72.0 in | Wt 147.7 lb

## 2017-07-28 DIAGNOSIS — N189 Chronic kidney disease, unspecified: Secondary | ICD-10-CM | POA: Insufficient documentation

## 2017-07-28 DIAGNOSIS — D631 Anemia in chronic kidney disease: Secondary | ICD-10-CM | POA: Insufficient documentation

## 2017-07-28 DIAGNOSIS — C9000 Multiple myeloma not having achieved remission: Secondary | ICD-10-CM

## 2017-07-28 LAB — CBC WITH DIFFERENTIAL (CANCER CENTER ONLY)
BASOS ABS: 0 10*3/uL (ref 0.0–0.1)
BASOS PCT: 0 %
Eosinophils Absolute: 0.1 10*3/uL (ref 0.0–0.5)
Eosinophils Relative: 2 %
HEMATOCRIT: 25.4 % — AB (ref 38.4–49.9)
HEMOGLOBIN: 7.9 g/dL — AB (ref 13.0–17.1)
Lymphocytes Relative: 19 %
Lymphs Abs: 0.8 10*3/uL — ABNORMAL LOW (ref 0.9–3.3)
MCH: 23.5 pg — ABNORMAL LOW (ref 27.2–33.4)
MCHC: 31.1 g/dL — ABNORMAL LOW (ref 32.0–36.0)
MCV: 75.6 fL — ABNORMAL LOW (ref 79.3–98.0)
Monocytes Absolute: 0.2 10*3/uL (ref 0.1–0.9)
Monocytes Relative: 4 %
NEUTROS ABS: 3 10*3/uL (ref 1.5–6.5)
NEUTROS PCT: 75 %
Platelet Count: 73 10*3/uL — ABNORMAL LOW (ref 140–400)
RBC: 3.36 MIL/uL — ABNORMAL LOW (ref 4.20–5.82)
RDW: 19.9 % — ABNORMAL HIGH (ref 11.0–14.6)
WBC Count: 4.1 10*3/uL (ref 4.0–10.3)

## 2017-07-28 LAB — SAMPLE TO BLOOD BANK

## 2017-07-28 MED ORDER — EPOETIN ALFA 20000 UNIT/ML IJ SOLN
60000.0000 [IU] | Freq: Once | INTRAMUSCULAR | Status: AC
  Administered 2017-07-28: 60000 [IU] via SUBCUTANEOUS

## 2017-07-28 NOTE — Patient Instructions (Signed)
Epoetin Alfa injection What is this medicine? EPOETIN ALFA (e POE e tin AL fa) helps your body make more red blood cells. This medicine is used to treat anemia caused by chronic kidney failure, cancer chemotherapy, or HIV-therapy. It may also be used before surgery if you have anemia. This medicine may be used for other purposes; ask your health care provider or pharmacist if you have questions. COMMON BRAND NAME(S): Epogen, Procrit What should I tell my health care provider before I take this medicine? They need to know if you have any of these conditions: -blood clotting disorders -cancer patient not on chemotherapy -cystic fibrosis -heart disease, such as angina or heart failure -hemoglobin level of 12 g/dL or greater -high blood pressure -low levels of folate, iron, or vitamin B12 -seizures -an unusual or allergic reaction to erythropoietin, albumin, benzyl alcohol, hamster proteins, other medicines, foods, dyes, or preservatives -pregnant or trying to get pregnant -breast-feeding How should I use this medicine? This medicine is for injection into a vein or under the skin. It is usually given by a health care professional in a hospital or clinic setting. If you get this medicine at home, you will be taught how to prepare and give this medicine. Use exactly as directed. Take your medicine at regular intervals. Do not take your medicine more often than directed. It is important that you put your used needles and syringes in a special sharps container. Do not put them in a trash can. If you do not have a sharps container, call your pharmacist or healthcare provider to get one. A special MedGuide will be given to you by the pharmacist with each prescription and refill. Be sure to read this information carefully each time. Talk to your pediatrician regarding the use of this medicine in children. While this drug may be prescribed for selected conditions, precautions do apply. Overdosage: If you  think you have taken too much of this medicine contact a poison control center or emergency room at once. NOTE: This medicine is only for you. Do not share this medicine with others. What if I miss a dose? If you miss a dose, take it as soon as you can. If it is almost time for your next dose, take only that dose. Do not take double or extra doses. What may interact with this medicine? Do not take this medicine with any of the following medications: -darbepoetin alfa This list may not describe all possible interactions. Give your health care provider a list of all the medicines, herbs, non-prescription drugs, or dietary supplements you use. Also tell them if you smoke, drink alcohol, or use illegal drugs. Some items may interact with your medicine. What should I watch for while using this medicine? Your condition will be monitored carefully while you are receiving this medicine. You may need blood work done while you are taking this medicine. What side effects may I notice from receiving this medicine? Side effects that you should report to your doctor or health care professional as soon as possible: -allergic reactions like skin rash, itching or hives, swelling of the face, lips, or tongue -breathing problems -changes in vision -chest pain -confusion, trouble speaking or understanding -feeling faint or lightheaded, falls -high blood pressure -muscle aches or pains -pain, swelling, warmth in the leg -rapid weight gain -severe headaches -sudden numbness or weakness of the face, arm or leg -trouble walking, dizziness, loss of balance or coordination -seizures (convulsions) -swelling of the ankles, feet, hands -unusually weak or tired   Side effects that usually do not require medical attention (report to your doctor or health care professional if they continue or are bothersome): -diarrhea -fever, chills (flu-like symptoms) -headaches -nausea, vomiting -redness, stinging, or swelling at  site where injected This list may not describe all possible side effects. Call your doctor for medical advice about side effects. You may report side effects to FDA at 1-800-FDA-1088. Where should I keep my medicine? Keep out of the reach of children. Store in a refrigerator between 2 and 8 degrees C (36 and 46 degrees F). Do not freeze or shake. Throw away any unused portion if using a single-dose vial. Multi-dose vials can be kept in the refrigerator for up to 21 days after the initial dose. Throw away unused medicine. NOTE: This sheet is a summary. It may not cover all possible information. If you have questions about this medicine, talk to your doctor, pharmacist, or health care provider.  2018 Elsevier/Gold Standard (2015-09-03 19:42:31) Cyanocobalamin, Vitamin B12 injection What is this medicine? CYANOCOBALAMIN (sye an oh koe BAL a min) is a man made form of vitamin B12. Vitamin B12 is used in the growth of healthy blood cells, nerve cells, and proteins in the body. It also helps with the metabolism of fats and carbohydrates. This medicine is used to treat people who can not absorb vitamin B12. This medicine may be used for other purposes; ask your health care provider or pharmacist if you have questions. COMMON BRAND NAME(S): B-12 Compliance Kit, B-12 Injection Kit, Cyomin, LA-12, Nutri-Twelve, Physicians EZ Use B-12, Primabalt What should I tell my health care provider before I take this medicine? They need to know if you have any of these conditions: -kidney disease -Leber's disease -megaloblastic anemia -an unusual or allergic reaction to cyanocobalamin, cobalt, other medicines, foods, dyes, or preservatives -pregnant or trying to get pregnant -breast-feeding How should I use this medicine? This medicine is injected into a muscle or deeply under the skin. It is usually given by a health care professional in a clinic or doctor's office. However, your doctor may teach you how to inject  yourself. Follow all instructions. Talk to your pediatrician regarding the use of this medicine in children. Special care may be needed. Overdosage: If you think you have taken too much of this medicine contact a poison control center or emergency room at once. NOTE: This medicine is only for you. Do not share this medicine with others. What if I miss a dose? If you are given your dose at a clinic or doctor's office, call to reschedule your appointment. If you give your own injections and you miss a dose, take it as soon as you can. If it is almost time for your next dose, take only that dose. Do not take double or extra doses. What may interact with this medicine? -colchicine -heavy alcohol intake This list may not describe all possible interactions. Give your health care provider a list of all the medicines, herbs, non-prescription drugs, or dietary supplements you use. Also tell them if you smoke, drink alcohol, or use illegal drugs. Some items may interact with your medicine. What should I watch for while using this medicine? Visit your doctor or health care professional regularly. You may need blood work done while you are taking this medicine. You may need to follow a special diet. Talk to your doctor. Limit your alcohol intake and avoid smoking to get the best benefit. What side effects may I notice from receiving this medicine? Side effects   that you should report to your doctor or health care professional as soon as possible: -allergic reactions like skin rash, itching or hives, swelling of the face, lips, or tongue -blue tint to skin -chest tightness, pain -difficulty breathing, wheezing -dizziness -red, swollen painful area on the leg Side effects that usually do not require medical attention (report to your doctor or health care professional if they continue or are bothersome): -diarrhea -headache This list may not describe all possible side effects. Call your doctor for medical  advice about side effects. You may report side effects to FDA at 1-800-FDA-1088. Where should I keep my medicine? Keep out of the reach of children. Store at room temperature between 15 and 30 degrees C (59 and 85 degrees F). Protect from light. Throw away any unused medicine after the expiration date. NOTE: This sheet is a summary. It may not cover all possible information. If you have questions about this medicine, talk to your doctor, pharmacist, or health care provider.  2018 Elsevier/Gold Standard (2007-04-26 22:10:20)  

## 2017-07-28 NOTE — Progress Notes (Addendum)
  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  He received an erythropoietin injection on 07/13/2017.  He denies bleeding.  He describes his breathing as "alright".  No recent falls.  Objective:  Vital signs in last 24 hours:  Blood pressure 136/78, pulse 70, temperature 98.7 F (37.1 C), temperature source Oral, resp. rate 17, height 6' (1.829 m), weight 147 lb 11.2 oz (67 kg), SpO2 100 %.    HEENT: No thrush or ulcers. Resp: Lungs clear bilaterally. Cardio: Regular rate and rhythm. GI: Abdomen soft and nontender.  No hepatospleno megaly. Vascular: No leg edema.   Lab Results:  Lab Results  Component Value Date   WBC 4.1 07/28/2017   HGB 7.9 (L) 07/28/2017   HCT 25.4 (L) 07/28/2017   MCV 75.6 (L) 07/28/2017   PLT 73 (L) 07/28/2017   NEUTROABS 3.0 07/28/2017    Imaging:  No results found.  Medications: I have reviewed the patient's current medications.  Assessment/Plan: 1. Pancytopenia-transfused with packed red blood cells 11/28/2016, 12/27/2016, 01/13/2017, 02/02/2017 2. History of beta thalassemia trait 3. History of vitamin B-12 deficiency-vitamin B-12 replacement initiated 11/11/2016 4. History of a serum monoclonal IgAkappaprotein-elevated serum free kappa light chains, IgA, and M spike10/16/2018  Bone marrow biopsy 12/03/2016-consistent with multiple myeloma  Bone survey 12/06/2016-no destructive or lytic bone lesions identified.  Revlimid 21 days on/7 days off and weekly dexamethasone 12/16/2016(Decadron discontinued 12/29/2016 secondary to altered mental status)  Cycle 2 single agent Revlimid 01/14/2017(discontinued 3 days early)  Serum light chains, IgAimproved 02/02/2017  Cycle3 single agent Revlimid beginning 02/16/2017  Cycle4single agent Revlimid beginning 03/16/2017  Cycle 5 single agent Revlimid beginning 04/14/2017(Revlimid placed on hold beginning  04/28/2017 due to thrombocytopenia)  Cycle 6 single agent Revlimid beginning 05/23/2017 x 14 days 5. COPD with acute and chronic respiratory failure/hypoxemia 6. History of coronary artery disease 7. Ischemic cardiomyopathy 8. ICD in place 9. Prostatic hypertrophy 10.Inappropriately low erythropoietin level 11/28/2016 11. Severe pancytopenia secondary to multiple myeloma and Revlimid 12. Fever-likely related to an upper respiratory infection, RSV positive swab 02/02/2017 13.Anemia secondary to renal insufficiency-trial of weekly erythropoietin initiated 06/17/2017.  Erythropoietin dose increased to 60,000 units weekly beginning 07/28/2017.   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  He remains off of specific treatment for the myeloma.  We will follow-up on the outstanding myeloma labs from today.  Dr. [name] discussed possible treatment options with him at today's visit.  We will discuss further once the myeloma labs are available.  He has progressive anemia.  We will increase the erythropoietin from 40,000 units weekly to 60,000 units weekly.  We will arrange for a blood transfusion later this week.  He will return for a follow-up visit in 2 weeks.  He will contact the office in the interim with any problems.  Patient seen with Dr. [name].  Ned Card ANP/GNP-BC   07/28/2017  3:50 PM  This was a shared visit with Ned Card.  [name] has persistent severe anemia.  We will arrange for transfusion of 1 unit packed red blood cells.  The erythropoietin dose will be increased to 60,000 units weekly.  We continued discussion regarding treatment options for the myeloma.  We will follow-up on the myeloma panel from today.  We will consider initiating salvage systemic therapy if the IgA level is significantly higher or for persistent anemia despite the higher dose of erythropoietin.  Julieanne Manson, MD

## 2017-07-28 NOTE — Telephone Encounter (Signed)
Called patient regarding 7/3

## 2017-07-29 ENCOUNTER — Telehealth: Payer: Self-pay | Admitting: Nurse Practitioner

## 2017-07-29 ENCOUNTER — Inpatient Hospital Stay: Payer: Medicare Other

## 2017-07-29 VITALS — BP 109/46 | HR 68 | Temp 97.9°F | Resp 18

## 2017-07-29 DIAGNOSIS — C9000 Multiple myeloma not having achieved remission: Secondary | ICD-10-CM | POA: Diagnosis not present

## 2017-07-29 DIAGNOSIS — N189 Chronic kidney disease, unspecified: Secondary | ICD-10-CM | POA: Diagnosis not present

## 2017-07-29 DIAGNOSIS — D631 Anemia in chronic kidney disease: Secondary | ICD-10-CM | POA: Diagnosis not present

## 2017-07-29 LAB — PREPARE RBC (CROSSMATCH)

## 2017-07-29 LAB — KAPPA/LAMBDA LIGHT CHAINS
Kappa free light chain: 244.8 mg/L — ABNORMAL HIGH (ref 3.3–19.4)
Kappa, lambda light chain ratio: 27.51 — ABNORMAL HIGH (ref 0.26–1.65)
Lambda free light chains: 8.9 mg/L (ref 5.7–26.3)

## 2017-07-29 LAB — IGA: IGA: 559 mg/dL — AB (ref 61–437)

## 2017-07-29 MED ORDER — SODIUM CHLORIDE 0.9 % IV SOLN
Freq: Once | INTRAVENOUS | Status: AC
  Administered 2017-07-29: 09:00:00 via INTRAVENOUS

## 2017-07-29 NOTE — Patient Instructions (Signed)

## 2017-07-29 NOTE — Telephone Encounter (Signed)
Scheduled appt per 7/2 los - pt to get an updated schedule in treatment area.

## 2017-07-30 LAB — PROTEIN ELECTROPHORESIS, SERUM
A/G RATIO SPE: 1.8 — AB (ref 0.7–1.7)
ALPHA-2-GLOBULIN: 0.4 g/dL (ref 0.4–1.0)
Albumin ELP: 3.6 g/dL (ref 2.9–4.4)
Alpha-1-Globulin: 0.2 g/dL (ref 0.0–0.4)
Beta Globulin: 0.6 g/dL — ABNORMAL LOW (ref 0.7–1.3)
GLOBULIN, TOTAL: 2 g/dL — AB (ref 2.2–3.9)
Gamma Globulin: 0.7 g/dL (ref 0.4–1.8)
M-Spike, %: 0.3 g/dL — ABNORMAL HIGH
TOTAL PROTEIN ELP: 5.6 g/dL — AB (ref 6.0–8.5)

## 2017-07-30 LAB — TYPE AND SCREEN
ABO/RH(D): A POS
Antibody Screen: NEGATIVE
UNIT DIVISION: 0

## 2017-07-30 LAB — BPAM RBC
Blood Product Expiration Date: 201907262359
ISSUE DATE / TIME: 201907030926
Unit Type and Rh: 6200

## 2017-08-03 ENCOUNTER — Ambulatory Visit (INDEPENDENT_AMBULATORY_CARE_PROVIDER_SITE_OTHER): Payer: Medicare Other

## 2017-08-03 DIAGNOSIS — Z95810 Presence of automatic (implantable) cardiac defibrillator: Secondary | ICD-10-CM | POA: Diagnosis not present

## 2017-08-03 DIAGNOSIS — I5022 Chronic systolic (congestive) heart failure: Secondary | ICD-10-CM | POA: Diagnosis not present

## 2017-08-03 NOTE — Progress Notes (Signed)
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 Heart Failure questions reviewed, pt asymptomatic.   Thoracic impedance normal but was abnormal suggesting fluid accumulation from 07/28/2017 - 08/02/2017.  No diuretic   Recommendations: No changes.   Encouraged to call for fluid symptoms.  Follow-up plan: ICM clinic phone appointment on 09/07/2017.    Copy of ICM check sent to Dr. Rayann Heman.   3 month ICM trend: 08/03/2017    1 Year ICM trend:       Rosalene Billings, RN 08/03/2017 1:25 PM

## 2017-08-04 ENCOUNTER — Inpatient Hospital Stay: Payer: Medicare Other

## 2017-08-04 VITALS — BP 105/60 | HR 60 | Temp 98.1°F | Resp 16

## 2017-08-04 DIAGNOSIS — C9000 Multiple myeloma not having achieved remission: Secondary | ICD-10-CM

## 2017-08-04 DIAGNOSIS — E538 Deficiency of other specified B group vitamins: Secondary | ICD-10-CM

## 2017-08-04 DIAGNOSIS — N189 Chronic kidney disease, unspecified: Secondary | ICD-10-CM | POA: Diagnosis not present

## 2017-08-04 DIAGNOSIS — D631 Anemia in chronic kidney disease: Secondary | ICD-10-CM | POA: Diagnosis not present

## 2017-08-04 LAB — CBC WITH DIFFERENTIAL (CANCER CENTER ONLY)
BASOS ABS: 0 10*3/uL (ref 0.0–0.1)
BASOS PCT: 0 %
EOS ABS: 0.1 10*3/uL (ref 0.0–0.5)
EOS PCT: 3 %
HCT: 29.5 % — ABNORMAL LOW (ref 38.4–49.9)
Hemoglobin: 9.2 g/dL — ABNORMAL LOW (ref 13.0–17.1)
LYMPHS ABS: 1.1 10*3/uL (ref 0.9–3.3)
LYMPHS PCT: 25 %
MCH: 24.3 pg — ABNORMAL LOW (ref 27.2–33.4)
MCHC: 31.2 g/dL — ABNORMAL LOW (ref 32.0–36.0)
MCV: 77.8 fL — ABNORMAL LOW (ref 79.3–98.0)
Monocytes Absolute: 0.3 10*3/uL (ref 0.1–0.9)
Monocytes Relative: 7 %
NEUTROS ABS: 2.7 10*3/uL (ref 1.5–6.5)
NEUTROS PCT: 65 %
NRBC: 2 /100{WBCs} — AB
Platelet Count: 71 10*3/uL — ABNORMAL LOW (ref 140–400)
RBC: 3.79 MIL/uL — AB (ref 4.20–5.82)
RDW: 21.3 % — AB (ref 11.0–14.6)
WBC Count: 4.2 10*3/uL (ref 4.0–10.3)

## 2017-08-04 MED ORDER — EPOETIN ALFA 20000 UNIT/ML IJ SOLN
60000.0000 [IU] | Freq: Once | INTRAMUSCULAR | Status: AC
  Administered 2017-08-04: 60000 [IU] via SUBCUTANEOUS

## 2017-08-04 MED ORDER — EPOETIN ALFA 20000 UNIT/ML IJ SOLN
INTRAMUSCULAR | Status: AC
  Filled 2017-08-04: qty 1

## 2017-08-04 NOTE — Patient Instructions (Signed)
Epoetin Alfa injection What is this medicine? EPOETIN ALFA (e POE e tin AL fa) helps your body make more red blood cells. This medicine is used to treat anemia caused by chronic kidney failure, cancer chemotherapy, or HIV-therapy. It may also be used before surgery if you have anemia. This medicine may be used for other purposes; ask your health care provider or pharmacist if you have questions. COMMON BRAND NAME(S): Epogen, Procrit What should I tell my health care provider before I take this medicine? They need to know if you have any of these conditions: -blood clotting disorders -cancer patient not on chemotherapy -cystic fibrosis -heart disease, such as angina or heart failure -hemoglobin level of 12 g/dL or greater -high blood pressure -low levels of folate, iron, or vitamin B12 -seizures -an unusual or allergic reaction to erythropoietin, albumin, benzyl alcohol, hamster proteins, other medicines, foods, dyes, or preservatives -pregnant or trying to get pregnant -breast-feeding How should I use this medicine? This medicine is for injection into a vein or under the skin. It is usually given by a health care professional in a hospital or clinic setting. If you get this medicine at home, you will be taught how to prepare and give this medicine. Use exactly as directed. Take your medicine at regular intervals. Do not take your medicine more often than directed. It is important that you put your used needles and syringes in a special sharps container. Do not put them in a trash can. If you do not have a sharps container, call your pharmacist or healthcare provider to get one. A special MedGuide will be given to you by the pharmacist with each prescription and refill. Be sure to read this information carefully each time. Talk to your pediatrician regarding the use of this medicine in children. While this drug may be prescribed for selected conditions, precautions do apply. Overdosage: If you  think you have taken too much of this medicine contact a poison control center or emergency room at once. NOTE: This medicine is only for you. Do not share this medicine with others. What if I miss a dose? If you miss a dose, take it as soon as you can. If it is almost time for your next dose, take only that dose. Do not take double or extra doses. What may interact with this medicine? Do not take this medicine with any of the following medications: -darbepoetin alfa This list may not describe all possible interactions. Give your health care provider a list of all the medicines, herbs, non-prescription drugs, or dietary supplements you use. Also tell them if you smoke, drink alcohol, or use illegal drugs. Some items may interact with your medicine. What should I watch for while using this medicine? Your condition will be monitored carefully while you are receiving this medicine. You may need blood work done while you are taking this medicine. What side effects may I notice from receiving this medicine? Side effects that you should report to your doctor or health care professional as soon as possible: -allergic reactions like skin rash, itching or hives, swelling of the face, lips, or tongue -breathing problems -changes in vision -chest pain -confusion, trouble speaking or understanding -feeling faint or lightheaded, falls -high blood pressure -muscle aches or pains -pain, swelling, warmth in the leg -rapid weight gain -severe headaches -sudden numbness or weakness of the face, arm or leg -trouble walking, dizziness, loss of balance or coordination -seizures (convulsions) -swelling of the ankles, feet, hands -unusually weak or tired   Side effects that usually do not require medical attention (report to your doctor or health care professional if they continue or are bothersome): -diarrhea -fever, chills (flu-like symptoms) -headaches -nausea, vomiting -redness, stinging, or swelling at  site where injected This list may not describe all possible side effects. Call your doctor for medical advice about side effects. You may report side effects to FDA at 1-800-FDA-1088. Where should I keep my medicine? Keep out of the reach of children. Store in a refrigerator between 2 and 8 degrees C (36 and 46 degrees F). Do not freeze or shake. Throw away any unused portion if using a single-dose vial. Multi-dose vials can be kept in the refrigerator for up to 21 days after the initial dose. Throw away unused medicine. NOTE: This sheet is a summary. It may not cover all possible information. If you have questions about this medicine, talk to your doctor, pharmacist, or health care provider.  2018 Elsevier/Gold Standard (2015-09-03 19:42:31) Cyanocobalamin, Vitamin B12 injection What is this medicine? CYANOCOBALAMIN (sye an oh koe BAL a min) is a man made form of vitamin B12. Vitamin B12 is used in the growth of healthy blood cells, nerve cells, and proteins in the body. It also helps with the metabolism of fats and carbohydrates. This medicine is used to treat people who can not absorb vitamin B12. This medicine may be used for other purposes; ask your health care provider or pharmacist if you have questions. COMMON BRAND NAME(S): B-12 Compliance Kit, B-12 Injection Kit, Cyomin, LA-12, Nutri-Twelve, Physicians EZ Use B-12, Primabalt What should I tell my health care provider before I take this medicine? They need to know if you have any of these conditions: -kidney disease -Leber's disease -megaloblastic anemia -an unusual or allergic reaction to cyanocobalamin, cobalt, other medicines, foods, dyes, or preservatives -pregnant or trying to get pregnant -breast-feeding How should I use this medicine? This medicine is injected into a muscle or deeply under the skin. It is usually given by a health care professional in a clinic or doctor's office. However, your doctor may teach you how to inject  yourself. Follow all instructions. Talk to your pediatrician regarding the use of this medicine in children. Special care may be needed. Overdosage: If you think you have taken too much of this medicine contact a poison control center or emergency room at once. NOTE: This medicine is only for you. Do not share this medicine with others. What if I miss a dose? If you are given your dose at a clinic or doctor's office, call to reschedule your appointment. If you give your own injections and you miss a dose, take it as soon as you can. If it is almost time for your next dose, take only that dose. Do not take double or extra doses. What may interact with this medicine? -colchicine -heavy alcohol intake This list may not describe all possible interactions. Give your health care provider a list of all the medicines, herbs, non-prescription drugs, or dietary supplements you use. Also tell them if you smoke, drink alcohol, or use illegal drugs. Some items may interact with your medicine. What should I watch for while using this medicine? Visit your doctor or health care professional regularly. You may need blood work done while you are taking this medicine. You may need to follow a special diet. Talk to your doctor. Limit your alcohol intake and avoid smoking to get the best benefit. What side effects may I notice from receiving this medicine? Side effects   that you should report to your doctor or health care professional as soon as possible: -allergic reactions like skin rash, itching or hives, swelling of the face, lips, or tongue -blue tint to skin -chest tightness, pain -difficulty breathing, wheezing -dizziness -red, swollen painful area on the leg Side effects that usually do not require medical attention (report to your doctor or health care professional if they continue or are bothersome): -diarrhea -headache This list may not describe all possible side effects. Call your doctor for medical  advice about side effects. You may report side effects to FDA at 1-800-FDA-1088. Where should I keep my medicine? Keep out of the reach of children. Store at room temperature between 15 and 30 degrees C (59 and 85 degrees F). Protect from light. Throw away any unused medicine after the expiration date. NOTE: This sheet is a summary. It may not cover all possible information. If you have questions about this medicine, talk to your doctor, pharmacist, or health care provider.  2018 Elsevier/Gold Standard (2007-04-26 22:10:20)  

## 2017-08-11 ENCOUNTER — Telehealth: Payer: Self-pay | Admitting: Oncology

## 2017-08-11 ENCOUNTER — Inpatient Hospital Stay (HOSPITAL_BASED_OUTPATIENT_CLINIC_OR_DEPARTMENT_OTHER): Payer: Medicare Other | Admitting: Oncology

## 2017-08-11 ENCOUNTER — Inpatient Hospital Stay: Payer: Medicare Other

## 2017-08-11 ENCOUNTER — Inpatient Hospital Stay: Payer: Medicare Other | Admitting: Oncology

## 2017-08-11 VITALS — BP 119/40 | HR 64 | Temp 98.0°F | Resp 18 | Ht 72.0 in

## 2017-08-11 DIAGNOSIS — N189 Chronic kidney disease, unspecified: Secondary | ICD-10-CM

## 2017-08-11 DIAGNOSIS — C9000 Multiple myeloma not having achieved remission: Secondary | ICD-10-CM | POA: Diagnosis not present

## 2017-08-11 DIAGNOSIS — E538 Deficiency of other specified B group vitamins: Secondary | ICD-10-CM

## 2017-08-11 DIAGNOSIS — D631 Anemia in chronic kidney disease: Secondary | ICD-10-CM | POA: Diagnosis not present

## 2017-08-11 LAB — CBC WITH DIFFERENTIAL (CANCER CENTER ONLY)
BASOS ABS: 0 10*3/uL (ref 0.0–0.1)
BASOS PCT: 0 %
EOS ABS: 0.1 10*3/uL (ref 0.0–0.5)
Eosinophils Relative: 3 %
HCT: 30.5 % — ABNORMAL LOW (ref 38.4–49.9)
Hemoglobin: 9.6 g/dL — ABNORMAL LOW (ref 13.0–17.1)
Lymphocytes Relative: 18 %
Lymphs Abs: 0.9 10*3/uL (ref 0.9–3.3)
MCH: 24.4 pg — AB (ref 27.2–33.4)
MCHC: 31.5 g/dL — ABNORMAL LOW (ref 32.0–36.0)
MCV: 77.4 fL — ABNORMAL LOW (ref 79.3–98.0)
MONOS PCT: 7 %
Monocytes Absolute: 0.3 10*3/uL (ref 0.1–0.9)
NEUTROS PCT: 72 %
NRBC: 1 /100{WBCs} — AB
Neutro Abs: 3.7 10*3/uL (ref 1.5–6.5)
PLATELETS: 82 10*3/uL — AB (ref 140–400)
RBC: 3.94 MIL/uL — AB (ref 4.20–5.82)
RDW: 21.5 % — ABNORMAL HIGH (ref 11.0–14.6)
WBC: 5 10*3/uL (ref 4.0–10.3)

## 2017-08-11 LAB — SAMPLE TO BLOOD BANK

## 2017-08-11 MED ORDER — EPOETIN ALFA 40000 UNIT/ML IJ SOLN
INTRAMUSCULAR | Status: AC
  Filled 2017-08-11: qty 1

## 2017-08-11 MED ORDER — EPOETIN ALFA 20000 UNIT/ML IJ SOLN
INTRAMUSCULAR | Status: AC
  Filled 2017-08-11: qty 1

## 2017-08-11 MED ORDER — EPOETIN ALFA 40000 UNIT/ML IJ SOLN
60000.0000 [IU] | Freq: Once | INTRAMUSCULAR | Status: AC
  Administered 2017-08-11: 60000 [IU] via SUBCUTANEOUS
  Filled 2017-08-11: qty 1

## 2017-08-11 NOTE — Telephone Encounter (Signed)
Scheduled appt per 7/16 los - gave patient AVS and calender per los. Unable to schedule in 8/12 due to pt availabilty .

## 2017-08-11 NOTE — Telephone Encounter (Signed)
Per MD ok to schedule today and Melia will do injection in office.

## 2017-08-11 NOTE — Telephone Encounter (Signed)
Called patient regarding voicemail and waiting on nurse to let me know date. Will call patient back to reschedule

## 2017-08-11 NOTE — Progress Notes (Signed)
[facility] OFFICE PROGRESS NOTE   Diagnosis: Multiple myeloma, anemia, renal insufficiency  INTERVAL HISTORY:   [name] [name] returns as scheduled.  [name] [name] reports feeling well.  No complaint.  [name] [name] was transfused with end of packed red blood cells on 07/29/2017.  The Procrit dose was increased to 60,000 units on 07/28/2017.  Objective:  Vital signs in last 24 hours:  Blood pressure (!) 119/40, pulse 64, temperature 98 F (36.7 C), temperature source Oral, resp. rate 18, height 6' (1.829 m), SpO2 98 %.  Resp: Distant breath sounds, no respiratory distress Cardio: Irregular GI: No hepatospleno megaly, nontender Vascular: No leg edema  Skin: Resolving ecchymosis at the right lower back  Portacath/PICC-without erythema  Lab Results:  Lab Results  Component Value Date   WBC 5.0 08/11/2017   HGB 9.6 (L) 08/11/2017   HCT 30.5 (L) 08/11/2017   MCV 77.4 (L) 08/11/2017   PLT 82 (L) 08/11/2017   NEUTROABS 3.7 08/11/2017    CMP  Lab Results  Component Value Date   NA 143 06/15/2017   K 3.5 06/15/2017   CL 109 06/15/2017   CO2 28 06/15/2017   GLUCOSE 103 06/15/2017   BUN 33 (H) 06/15/2017   CREATININE 1.30 06/15/2017   CALCIUM 8.1 (L) 06/15/2017   PROT 5.5 (L) 06/15/2017   ALBUMIN 3.1 (L) 06/15/2017   AST 9 06/15/2017   ALT 8 06/15/2017   ALKPHOS 53 06/15/2017   BILITOT 0.4 06/15/2017   GFRNONAA 47 (L) 06/15/2017   GFRAA 54 (L) 06/15/2017    Medications: I have reviewed the patient's current medications.   Assessment/Plan: 1. Pancytopenia-transfused with packed red blood cells 11/28/2016, 12/27/2016, 01/13/2017, 02/02/2017 2. History of beta thalassemia trait 3. History of vitamin B-12 deficiency-vitamin B-12 replacement initiated 11/11/2016 4. History of a serum monoclonal IgAkappaprotein-elevated serum free kappa light chains, IgA, and M spike10/16/2018  Bone marrow biopsy 12/03/2016-consistent with multiple myeloma  Bone survey 12/06/2016-no destructive  or lytic bone lesions identified.  Revlimid 21 days on/7 days off and weekly dexamethasone 12/16/2016(Decadron discontinued 12/29/2016 secondary to altered mental status)  Cycle 2 single agent Revlimid 01/14/2017(discontinued 3 days early)  Serum light chains, IgAimproved 02/02/2017  Cycle3 single agent Revlimid beginning 02/16/2017  Cycle4single agent Revlimid beginning 03/16/2017  Cycle 5 single agent Revlimid beginning 04/14/2017(Revlimid placed on hold beginning 04/28/2017 due to thrombocytopenia)  Cycle 6 single agent Revlimid beginning 05/23/2017 x 14 days 5. COPD with acute and chronic respiratory failure/hypoxemia 6. History of coronary artery disease 7. Ischemic cardiomyopathy 8. ICD in place 9. Prostatic hypertrophy 10.Inappropriately low erythropoietin level 11/28/2016 11. Severe pancytopenia secondary to multiple myeloma and Revlimid-improved 12. Fever-likely related to an upper respiratory infection, RSV positive swab 02/02/2017 13.Anemia secondary to renal insufficiency-trial of weekly erythropoietin initiated 06/17/2017.  Erythropoietin dose increased to 60,000 units weekly beginning 07/28/2017.   Disposition: [name] [name] appears stable.  The hemoglobin has improved with the higher dose of erythropoietin.  [name] [name] will continue weekly erythropoietin at a dose of 60,000 units.  [name] [name] is taking iron.  We will check serum iron studies when [name] [name] is here next week.  The IgA and serum light chains were slightly higher on 07/28/2017.  [name] [name] does not wish to consider salvage treatment with a steroid or medication that could cause neuropathy.  The plan is to observe the myeloma for now.  We will repeat a myeloma panel when [name] [name] is here next month.  [name] [name] will be scheduled for an office visit in approximately 4 weeks.  [name], MD  08/11/2017  4:30 PM

## 2017-08-17 ENCOUNTER — Inpatient Hospital Stay: Payer: Medicare Other

## 2017-08-17 ENCOUNTER — Other Ambulatory Visit: Payer: Medicare Other

## 2017-08-17 ENCOUNTER — Ambulatory Visit: Payer: Medicare Other

## 2017-08-17 VITALS — BP 138/57 | HR 65 | Temp 98.0°F | Resp 18

## 2017-08-17 DIAGNOSIS — N189 Chronic kidney disease, unspecified: Principal | ICD-10-CM

## 2017-08-17 DIAGNOSIS — C9000 Multiple myeloma not having achieved remission: Secondary | ICD-10-CM | POA: Diagnosis not present

## 2017-08-17 DIAGNOSIS — E538 Deficiency of other specified B group vitamins: Secondary | ICD-10-CM

## 2017-08-17 DIAGNOSIS — D631 Anemia in chronic kidney disease: Secondary | ICD-10-CM

## 2017-08-17 LAB — CBC WITH DIFFERENTIAL (CANCER CENTER ONLY)
BASOS ABS: 0 10*3/uL (ref 0.0–0.1)
Basophils Relative: 1 %
Eosinophils Absolute: 0.1 10*3/uL (ref 0.0–0.5)
Eosinophils Relative: 3 %
HEMATOCRIT: 31.1 % — AB (ref 38.4–49.9)
HEMOGLOBIN: 9.7 g/dL — AB (ref 13.0–17.1)
Lymphocytes Relative: 26 %
Lymphs Abs: 1.1 10*3/uL (ref 0.9–3.3)
MCH: 24.3 pg — ABNORMAL LOW (ref 27.2–33.4)
MCHC: 31.2 g/dL — AB (ref 32.0–36.0)
MCV: 77.9 fL — ABNORMAL LOW (ref 79.3–98.0)
MONO ABS: 0.2 10*3/uL (ref 0.1–0.9)
Monocytes Relative: 5 %
NEUTROS ABS: 2.9 10*3/uL (ref 1.5–6.5)
NEUTROS PCT: 65 %
NRBC: 1 /100{WBCs} — AB
Platelet Count: 83 10*3/uL — ABNORMAL LOW (ref 140–400)
RBC: 3.99 MIL/uL — ABNORMAL LOW (ref 4.20–5.82)
RDW: 21.8 % — ABNORMAL HIGH (ref 11.0–14.6)
WBC Count: 4.4 10*3/uL (ref 4.0–10.3)

## 2017-08-17 LAB — SAMPLE TO BLOOD BANK

## 2017-08-17 MED ORDER — EPOETIN ALFA 40000 UNIT/ML IJ SOLN
INTRAMUSCULAR | Status: AC
  Filled 2017-08-17: qty 1

## 2017-08-17 MED ORDER — EPOETIN ALFA 20000 UNIT/ML IJ SOLN
60000.0000 [IU] | Freq: Once | INTRAMUSCULAR | Status: AC
  Administered 2017-08-17: 60000 [IU] via SUBCUTANEOUS

## 2017-08-17 MED ORDER — EPOETIN ALFA 20000 UNIT/ML IJ SOLN
INTRAMUSCULAR | Status: AC
  Filled 2017-08-17: qty 1

## 2017-08-17 NOTE — Patient Instructions (Signed)
Epoetin Alfa injection °What is this medicine? °EPOETIN ALFA (e POE e tin AL fa) helps your body make more red blood cells. This medicine is used to treat anemia caused by chronic kidney failure, cancer chemotherapy, or HIV-therapy. It may also be used before surgery if you have anemia. °This medicine may be used for other purposes; ask your health care provider or pharmacist if you have questions. °COMMON BRAND NAME(S): Epogen, Procrit °What should I tell my health care provider before I take this medicine? °They need to know if you have any of these conditions: °-blood clotting disorders °-cancer patient not on chemotherapy °-cystic fibrosis °-heart disease, such as angina or heart failure °-hemoglobin level of 12 g/dL or greater °-high blood pressure °-low levels of folate, iron, or vitamin B12 °-seizures °-an unusual or allergic reaction to erythropoietin, albumin, benzyl alcohol, hamster proteins, other medicines, foods, dyes, or preservatives °-pregnant or trying to get pregnant °-breast-feeding °How should I use this medicine? °This medicine is for injection into a vein or under the skin. It is usually given by a health care professional in a hospital or clinic setting. °If you get this medicine at home, you will be taught how to prepare and give this medicine. Use exactly as directed. Take your medicine at regular intervals. Do not take your medicine more often than directed. °It is important that you put your used needles and syringes in a special sharps container. Do not put them in a trash can. If you do not have a sharps container, call your pharmacist or healthcare provider to get one. °A special MedGuide will be given to you by the pharmacist with each prescription and refill. Be sure to read this information carefully each time. °Talk to your pediatrician regarding the use of this medicine in children. While this drug may be prescribed for selected conditions, precautions do apply. °Overdosage: If you  think you have taken too much of this medicine contact a poison control center or emergency room at once. °NOTE: This medicine is only for you. Do not share this medicine with others. °What if I miss a dose? °If you miss a dose, take it as soon as you can. If it is almost time for your next dose, take only that dose. Do not take double or extra doses. °What may interact with this medicine? °Do not take this medicine with any of the following medications: °-darbepoetin alfa °This list may not describe all possible interactions. Give your health care provider a list of all the medicines, herbs, non-prescription drugs, or dietary supplements you use. Also tell them if you smoke, drink alcohol, or use illegal drugs. Some items may interact with your medicine. °What should I watch for while using this medicine? °Your condition will be monitored carefully while you are receiving this medicine. °You may need blood work done while you are taking this medicine. °What side effects may I notice from receiving this medicine? °Side effects that you should report to your doctor or health care professional as soon as possible: °-allergic reactions like skin rash, itching or hives, swelling of the face, lips, or tongue °-breathing problems °-changes in vision °-chest pain °-confusion, trouble speaking or understanding °-feeling faint or lightheaded, falls °-high blood pressure °-muscle aches or pains °-pain, swelling, warmth in the leg °-rapid weight gain °-severe headaches °-sudden numbness or weakness of the face, arm or leg °-trouble walking, dizziness, loss of balance or coordination °-seizures (convulsions) °-swelling of the ankles, feet, hands °-unusually weak or tired °  Side effects that usually do not require medical attention (report to your doctor or health care professional if they continue or are bothersome): °-diarrhea °-fever, chills (flu-like symptoms) °-headaches °-nausea, vomiting °-redness, stinging, or swelling at  site where injected °This list may not describe all possible side effects. Call your doctor for medical advice about side effects. You may report side effects to FDA at 1-800-FDA-1088. °Where should I keep my medicine? °Keep out of the reach of children. °Store in a refrigerator between 2 and 8 degrees C (36 and 46 degrees F). Do not freeze or shake. Throw away any unused portion if using a single-dose vial. Multi-dose vials can be kept in the refrigerator for up to 21 days after the initial dose. Throw away unused medicine. °NOTE: This sheet is a summary. It may not cover all possible information. If you have questions about this medicine, talk to your doctor, pharmacist, or health care provider. °© 2018 [employer]vier/Gold Standard (2015-09-03 19:42:31) ° °

## 2017-08-18 LAB — FERRITIN: FERRITIN: 1110 ng/mL — AB (ref 24–336)

## 2017-08-18 LAB — IRON AND TIBC
Iron: 102 ug/dL (ref 42–163)
Saturation Ratios: 49 % (ref 42–163)
TIBC: 208 ug/dL (ref 202–409)
UIBC: 106 ug/dL

## 2017-08-21 DIAGNOSIS — H353112 Nonexudative age-related macular degeneration, right eye, intermediate dry stage: Secondary | ICD-10-CM | POA: Diagnosis not present

## 2017-08-21 DIAGNOSIS — H43813 Vitreous degeneration, bilateral: Secondary | ICD-10-CM | POA: Diagnosis not present

## 2017-08-21 DIAGNOSIS — H353221 Exudative age-related macular degeneration, left eye, with active choroidal neovascularization: Secondary | ICD-10-CM | POA: Diagnosis not present

## 2017-08-21 DIAGNOSIS — H2511 Age-related nuclear cataract, right eye: Secondary | ICD-10-CM | POA: Diagnosis not present

## 2017-08-24 ENCOUNTER — Inpatient Hospital Stay: Payer: Medicare Other

## 2017-08-24 ENCOUNTER — Ambulatory Visit (INDEPENDENT_AMBULATORY_CARE_PROVIDER_SITE_OTHER): Payer: Medicare Other | Admitting: *Deleted

## 2017-08-24 ENCOUNTER — Ambulatory Visit: Payer: Medicare Other

## 2017-08-24 ENCOUNTER — Other Ambulatory Visit: Payer: Medicare Other

## 2017-08-24 VITALS — BP 134/77 | HR 62 | Temp 98.4°F | Resp 18

## 2017-08-24 DIAGNOSIS — E538 Deficiency of other specified B group vitamins: Secondary | ICD-10-CM

## 2017-08-24 DIAGNOSIS — C9000 Multiple myeloma not having achieved remission: Secondary | ICD-10-CM

## 2017-08-24 DIAGNOSIS — I255 Ischemic cardiomyopathy: Secondary | ICD-10-CM

## 2017-08-24 DIAGNOSIS — D631 Anemia in chronic kidney disease: Secondary | ICD-10-CM | POA: Diagnosis not present

## 2017-08-24 DIAGNOSIS — N189 Chronic kidney disease, unspecified: Secondary | ICD-10-CM | POA: Diagnosis not present

## 2017-08-24 LAB — CBC WITH DIFFERENTIAL (CANCER CENTER ONLY)
Basophils Absolute: 0 10*3/uL (ref 0.0–0.1)
Basophils Relative: 1 %
Eosinophils Absolute: 0.1 10*3/uL (ref 0.0–0.5)
Eosinophils Relative: 3 %
HCT: 31.3 % — ABNORMAL LOW (ref 38.4–49.9)
HEMOGLOBIN: 9.7 g/dL — AB (ref 13.0–17.1)
LYMPHS ABS: 0.9 10*3/uL (ref 0.9–3.3)
Lymphocytes Relative: 23 %
MCH: 23.9 pg — AB (ref 27.2–33.4)
MCHC: 31 g/dL — ABNORMAL LOW (ref 32.0–36.0)
MCV: 77.1 fL — ABNORMAL LOW (ref 79.3–98.0)
Monocytes Absolute: 0.2 10*3/uL (ref 0.1–0.9)
Monocytes Relative: 5 %
NEUTROS PCT: 68 %
Neutro Abs: 2.7 10*3/uL (ref 1.5–6.5)
Platelet Count: 78 10*3/uL — ABNORMAL LOW (ref 140–400)
RBC: 4.06 MIL/uL — AB (ref 4.20–5.82)
RDW: 21.9 % — ABNORMAL HIGH (ref 11.0–14.6)
WBC: 4 10*3/uL (ref 4.0–10.3)

## 2017-08-24 LAB — SAMPLE TO BLOOD BANK

## 2017-08-24 MED ORDER — EPOETIN ALFA 20000 UNIT/ML IJ SOLN
INTRAMUSCULAR | Status: AC
Start: 2017-08-24 — End: ?
  Filled 2017-08-24: qty 1

## 2017-08-24 MED ORDER — EPOETIN ALFA 20000 UNIT/ML IJ SOLN
60000.0000 [IU] | Freq: Once | INTRAMUSCULAR | Status: AC
  Administered 2017-08-24: 60000 [IU] via SUBCUTANEOUS

## 2017-08-24 NOTE — Patient Instructions (Signed)
Epoetin Alfa injection °What is this medicine? °EPOETIN ALFA (e POE e tin AL fa) helps your body make more red blood cells. This medicine is used to treat anemia caused by chronic kidney failure, cancer chemotherapy, or HIV-therapy. It may also be used before surgery if you have anemia. °This medicine may be used for other purposes; ask your health care provider or pharmacist if you have questions. °COMMON BRAND NAME(S): Epogen, Procrit °What should I tell my health care provider before I take this medicine? °They need to know if you have any of these conditions: °-blood clotting disorders °-cancer patient not on chemotherapy °-cystic fibrosis °-heart disease, such as angina or heart failure °-hemoglobin level of 12 g/dL or greater °-high blood pressure °-low levels of folate, iron, or vitamin B12 °-seizures °-an unusual or allergic reaction to erythropoietin, albumin, benzyl alcohol, hamster proteins, other medicines, foods, dyes, or preservatives °-pregnant or trying to get pregnant °-breast-feeding °How should I use this medicine? °This medicine is for injection into a vein or under the skin. It is usually given by a health care professional in a hospital or clinic setting. °If you get this medicine at home, you will be taught how to prepare and give this medicine. Use exactly as directed. Take your medicine at regular intervals. Do not take your medicine more often than directed. °It is important that you put your used needles and syringes in a special sharps container. Do not put them in a trash can. If you do not have a sharps container, call your pharmacist or healthcare provider to get one. °A special MedGuide will be given to you by the pharmacist with each prescription and refill. Be sure to read this information carefully each time. °Talk to your pediatrician regarding the use of this medicine in children. While this drug may be prescribed for selected conditions, precautions do apply. °Overdosage: If you  think you have taken too much of this medicine contact a poison control center or emergency room at once. °NOTE: This medicine is only for you. Do not share this medicine with others. °What if I miss a dose? °If you miss a dose, take it as soon as you can. If it is almost time for your next dose, take only that dose. Do not take double or extra doses. °What may interact with this medicine? °Do not take this medicine with any of the following medications: °-darbepoetin alfa °This list may not describe all possible interactions. Give your health care provider a list of all the medicines, herbs, non-prescription drugs, or dietary supplements you use. Also tell them if you smoke, drink alcohol, or use illegal drugs. Some items may interact with your medicine. °What should I watch for while using this medicine? °Your condition will be monitored carefully while you are receiving this medicine. °You may need blood work done while you are taking this medicine. °What side effects may I notice from receiving this medicine? °Side effects that you should report to your doctor or health care professional as soon as possible: °-allergic reactions like skin rash, itching or hives, swelling of the face, lips, or tongue °-breathing problems °-changes in vision °-chest pain °-confusion, trouble speaking or understanding °-feeling faint or lightheaded, falls °-high blood pressure °-muscle aches or pains °-pain, swelling, warmth in the leg °-rapid weight gain °-severe headaches °-sudden numbness or weakness of the face, arm or leg °-trouble walking, dizziness, loss of balance or coordination °-seizures (convulsions) °-swelling of the ankles, feet, hands °-unusually weak or tired °  Side effects that usually do not require medical attention (report to your doctor or health care professional if they continue or are bothersome): °-diarrhea °-fever, chills (flu-like symptoms) °-headaches °-nausea, vomiting °-redness, stinging, or swelling at  site where injected °This list may not describe all possible side effects. Call your doctor for medical advice about side effects. You may report side effects to FDA at 1-800-FDA-1088. °Where should I keep my medicine? °Keep out of the reach of children. °Store in a refrigerator between 2 and 8 degrees C (36 and 46 degrees F). Do not freeze or shake. Throw away any unused portion if using a single-dose vial. Multi-dose vials can be kept in the refrigerator for up to 21 days after the initial dose. Throw away unused medicine. °NOTE: This sheet is a summary. It may not cover all possible information. If you have questions about this medicine, talk to your doctor, pharmacist, or health care provider. °© 2018 [employer]vier/Gold Standard (2015-09-03 19:42:31) ° °

## 2017-08-24 NOTE — Progress Notes (Signed)
Remote ICD transmission.   

## 2017-08-25 ENCOUNTER — Encounter: Payer: Self-pay | Admitting: Cardiology

## 2017-08-31 ENCOUNTER — Other Ambulatory Visit: Payer: Medicare Other

## 2017-08-31 ENCOUNTER — Inpatient Hospital Stay: Payer: Medicare Other

## 2017-08-31 ENCOUNTER — Ambulatory Visit: Payer: Medicare Other

## 2017-08-31 ENCOUNTER — Inpatient Hospital Stay: Payer: Medicare Other | Attending: Oncology

## 2017-08-31 VITALS — BP 121/56 | HR 63 | Resp 18

## 2017-08-31 DIAGNOSIS — N189 Chronic kidney disease, unspecified: Secondary | ICD-10-CM | POA: Diagnosis not present

## 2017-08-31 DIAGNOSIS — E538 Deficiency of other specified B group vitamins: Secondary | ICD-10-CM

## 2017-08-31 DIAGNOSIS — C9000 Multiple myeloma not having achieved remission: Secondary | ICD-10-CM | POA: Diagnosis not present

## 2017-08-31 DIAGNOSIS — D631 Anemia in chronic kidney disease: Secondary | ICD-10-CM | POA: Diagnosis not present

## 2017-08-31 LAB — CBC WITH DIFFERENTIAL (CANCER CENTER ONLY)
BASOS ABS: 0 10*3/uL (ref 0.0–0.1)
BASOS PCT: 0 %
EOS ABS: 0.2 10*3/uL (ref 0.0–0.5)
Eosinophils Relative: 4 %
HCT: 31.7 % — ABNORMAL LOW (ref 38.4–49.9)
Hemoglobin: 9.8 g/dL — ABNORMAL LOW (ref 13.0–17.1)
Lymphocytes Relative: 21 %
Lymphs Abs: 1 10*3/uL (ref 0.9–3.3)
MCH: 23.6 pg — AB (ref 27.2–33.4)
MCHC: 30.9 g/dL — ABNORMAL LOW (ref 32.0–36.0)
MCV: 76.4 fL — ABNORMAL LOW (ref 79.3–98.0)
MONOS PCT: 7 %
Monocytes Absolute: 0.3 10*3/uL (ref 0.1–0.9)
NRBC: 1 /100{WBCs} — AB
Neutro Abs: 3.3 10*3/uL (ref 1.5–6.5)
Neutrophils Relative %: 68 %
PLATELETS: 90 10*3/uL — AB (ref 140–400)
RBC: 4.15 MIL/uL — AB (ref 4.20–5.82)
RDW: 22.6 % — ABNORMAL HIGH (ref 11.0–14.6)
WBC: 4.9 10*3/uL (ref 4.0–10.3)

## 2017-08-31 LAB — SAMPLE TO BLOOD BANK

## 2017-08-31 MED ORDER — EPOETIN ALFA 20000 UNIT/ML IJ SOLN
INTRAMUSCULAR | Status: AC
  Filled 2017-08-31: qty 1

## 2017-08-31 MED ORDER — EPOETIN ALFA 40000 UNIT/ML IJ SOLN
INTRAMUSCULAR | Status: AC
Start: 2017-08-31 — End: ?
  Filled 2017-08-31: qty 1

## 2017-08-31 MED ORDER — EPOETIN ALFA 20000 UNIT/ML IJ SOLN
60000.0000 [IU] | Freq: Once | INTRAMUSCULAR | Status: AC
  Administered 2017-08-31: 60000 [IU] via SUBCUTANEOUS

## 2017-09-01 DIAGNOSIS — I10 Essential (primary) hypertension: Secondary | ICD-10-CM | POA: Diagnosis not present

## 2017-09-01 DIAGNOSIS — J449 Chronic obstructive pulmonary disease, unspecified: Secondary | ICD-10-CM | POA: Diagnosis not present

## 2017-09-01 DIAGNOSIS — N183 Chronic kidney disease, stage 3 (moderate): Secondary | ICD-10-CM | POA: Diagnosis not present

## 2017-09-01 DIAGNOSIS — D696 Thrombocytopenia, unspecified: Secondary | ICD-10-CM | POA: Diagnosis not present

## 2017-09-01 DIAGNOSIS — Z95810 Presence of automatic (implantable) cardiac defibrillator: Secondary | ICD-10-CM | POA: Diagnosis not present

## 2017-09-01 DIAGNOSIS — C9000 Multiple myeloma not having achieved remission: Secondary | ICD-10-CM | POA: Diagnosis not present

## 2017-09-01 DIAGNOSIS — I2510 Atherosclerotic heart disease of native coronary artery without angina pectoris: Secondary | ICD-10-CM | POA: Diagnosis not present

## 2017-09-01 DIAGNOSIS — I5030 Unspecified diastolic (congestive) heart failure: Secondary | ICD-10-CM | POA: Diagnosis not present

## 2017-09-01 DIAGNOSIS — E1122 Type 2 diabetes mellitus with diabetic chronic kidney disease: Secondary | ICD-10-CM | POA: Diagnosis not present

## 2017-09-07 ENCOUNTER — Other Ambulatory Visit: Payer: Medicare Other

## 2017-09-07 ENCOUNTER — Ambulatory Visit: Payer: Medicare Other

## 2017-09-07 ENCOUNTER — Ambulatory Visit (INDEPENDENT_AMBULATORY_CARE_PROVIDER_SITE_OTHER): Payer: Medicare Other

## 2017-09-07 ENCOUNTER — Ambulatory Visit: Payer: Medicare Other | Admitting: Nurse Practitioner

## 2017-09-07 DIAGNOSIS — I5022 Chronic systolic (congestive) heart failure: Secondary | ICD-10-CM | POA: Diagnosis not present

## 2017-09-07 DIAGNOSIS — Z95810 Presence of automatic (implantable) cardiac defibrillator: Secondary | ICD-10-CM | POA: Diagnosis not present

## 2017-09-07 NOTE — Progress Notes (Signed)
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  Heart Failure questions reviewed, pt asymptomatic.   Thoracic impedance normal.   No diuretic   Recommendations: No changes.   Encouraged to call for fluid symptoms.  Follow-up plan: ICM clinic phone appointment on 10/08/2017.   Office appointment scheduled 09/17/2017 with Dr. Johnsie Cancel.    Copy of ICM check sent to Dr. Rayann Heman.   3 month ICM trend: 09/07/2017    1 Year ICM trend:       Rosalene Billings, RN 09/07/2017 2:21 PM

## 2017-09-09 ENCOUNTER — Inpatient Hospital Stay: Payer: Medicare Other

## 2017-09-09 ENCOUNTER — Telehealth: Payer: Self-pay | Admitting: Oncology

## 2017-09-09 ENCOUNTER — Inpatient Hospital Stay (HOSPITAL_BASED_OUTPATIENT_CLINIC_OR_DEPARTMENT_OTHER): Payer: Medicare Other | Admitting: Oncology

## 2017-09-09 VITALS — BP 121/61 | HR 67 | Temp 97.5°F | Resp 20 | Ht 72.0 in | Wt 148.3 lb

## 2017-09-09 DIAGNOSIS — N189 Chronic kidney disease, unspecified: Secondary | ICD-10-CM | POA: Diagnosis not present

## 2017-09-09 DIAGNOSIS — C9000 Multiple myeloma not having achieved remission: Secondary | ICD-10-CM

## 2017-09-09 DIAGNOSIS — D631 Anemia in chronic kidney disease: Secondary | ICD-10-CM

## 2017-09-09 LAB — CBC WITH DIFFERENTIAL (CANCER CENTER ONLY)
Basophils Absolute: 0 10*3/uL (ref 0.0–0.1)
Basophils Relative: 0 %
EOS PCT: 4 %
Eosinophils Absolute: 0.2 10*3/uL (ref 0.0–0.5)
HEMATOCRIT: 33.2 % — AB (ref 38.4–49.9)
Hemoglobin: 10.2 g/dL — ABNORMAL LOW (ref 13.0–17.1)
LYMPHS ABS: 0.9 10*3/uL (ref 0.9–3.3)
LYMPHS PCT: 19 %
MCH: 23.3 pg — ABNORMAL LOW (ref 27.2–33.4)
MCHC: 30.7 g/dL — AB (ref 32.0–36.0)
MCV: 76 fL — AB (ref 79.3–98.0)
MONO ABS: 0.3 10*3/uL (ref 0.1–0.9)
Monocytes Relative: 6 %
Neutro Abs: 3.3 10*3/uL (ref 1.5–6.5)
Neutrophils Relative %: 71 %
Platelet Count: 92 10*3/uL — ABNORMAL LOW (ref 140–400)
RBC: 4.37 MIL/uL (ref 4.20–5.82)
RDW: 22.8 % — AB (ref 11.0–14.6)
WBC: 4.6 10*3/uL (ref 4.0–10.3)
nRBC: 1 /100 WBC — ABNORMAL HIGH

## 2017-09-09 LAB — BASIC METABOLIC PANEL - CANCER CENTER ONLY
Anion gap: 12 (ref 5–15)
BUN: 34 mg/dL — ABNORMAL HIGH (ref 8–23)
CALCIUM: 8.3 mg/dL — AB (ref 8.9–10.3)
CO2: 22 mmol/L (ref 22–32)
CREATININE: 1.18 mg/dL (ref 0.61–1.24)
Chloride: 110 mmol/L (ref 98–111)
GFR, Est AFR Am: >60 mL/min (ref >60)
GFR, Estimated: 52 mL/min — ABNORMAL LOW (ref >60)
GLUCOSE: 119 mg/dL — AB (ref 70–99)
Potassium: 4 mmol/L (ref 3.5–5.1)
Sodium: 144 mmol/L (ref 135–145)

## 2017-09-09 LAB — SAMPLE TO BLOOD BANK

## 2017-09-09 MED ORDER — EPOETIN ALFA 40000 UNIT/ML IJ SOLN
INTRAMUSCULAR | Status: AC
  Filled 2017-09-09: qty 1

## 2017-09-09 MED ORDER — EPOETIN ALFA 20000 UNIT/ML IJ SOLN
60000.0000 [IU] | Freq: Once | INTRAMUSCULAR | Status: AC
  Administered 2017-09-09: 60000 [IU] via SUBCUTANEOUS

## 2017-09-09 MED ORDER — EPOETIN ALFA 20000 UNIT/ML IJ SOLN
INTRAMUSCULAR | Status: AC
  Filled 2017-09-09: qty 1

## 2017-09-09 NOTE — Telephone Encounter (Signed)
Scheduled appt per 8/14 los - pt daughter stephanie aware of appt date and time

## 2017-09-09 NOTE — Progress Notes (Signed)
  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  He feels well.  He continues weekly erythropoietin therapy.  He feels well.  No symptom of thrombosis.  No new complaint.  Objective:  Vital signs in last 24 hours:  Blood pressure 121/61, pulse 67, temperature (!) 97.5 F (36.4 C), temperature source Oral, resp. rate 20, height 6' (1.829 m), weight 148 lb 4.8 oz (67.3 kg), SpO2 97 %.   Resp: Distant breath sounds, inspiratory rhonchi at the left posterior chest, no respiratory distress Cardio: Distant heart sounds, regular rhythm GI: No hepatosplenomegaly Vascular: No leg edema  Lab Results:  Lab Results  Component Value Date   WBC 4.6 09/09/2017   HGB 10.2 (L) 09/09/2017   HCT 33.2 (L) 09/09/2017   MCV 76.0 (L) 09/09/2017   PLT PENDING 09/09/2017   NEUTROABS 3.3 09/09/2017    CMP  Lab Results  Component Value Date   NA 143 06/15/2017   K 3.5 06/15/2017   CL 109 06/15/2017   CO2 28 06/15/2017   GLUCOSE 103 06/15/2017   BUN 33 (H) 06/15/2017   CREATININE 1.30 06/15/2017   CALCIUM 8.1 (L) 06/15/2017   PROT 5.5 (L) 06/15/2017   ALBUMIN 3.1 (L) 06/15/2017   AST 9 06/15/2017   ALT 8 06/15/2017   ALKPHOS 53 06/15/2017   BILITOT 0.4 06/15/2017   GFRNONAA 47 (L) 06/15/2017   GFRAA 54 (L) 06/15/2017     Medications: I have reviewed the patient's current medications.   Assessment/Plan: 1. Pancytopenia-transfused with packed red blood cells 11/28/2016, 12/27/2016, 01/13/2017, 02/02/2017 2. History of beta thalassemia trait 3. History of vitamin B-12 deficiency-vitamin B-12 replacement initiated 11/11/2016 4. History of a serum monoclonal IgAkappaprotein-elevated serum free kappa light chains, IgA, and M spike10/16/2018  Bone marrow biopsy 12/03/2016-consistent with multiple myeloma  Bone survey 12/06/2016-no destructive or lytic bone lesions identified.  Revlimid  21 days on/7 days off and weekly dexamethasone 12/16/2016(Decadron discontinued 12/29/2016 secondary to altered mental status)  Cycle 2 single agent Revlimid 01/14/2017(discontinued 3 days early)  Serum light chains, IgAimproved 02/02/2017  Cycle3 single agent Revlimid beginning 02/16/2017  Cycle4single agent Revlimid beginning 03/16/2017  Cycle 5 single agent Revlimid beginning 04/14/2017(Revlimid placed on hold beginning 04/28/2017 due to thrombocytopenia)  Cycle 6 single agent Revlimid beginning 05/23/2017 x 14 days 5. COPD with acute and chronic respiratory failure/hypoxemia 6. History of coronary artery disease 7. Ischemic cardiomyopathy 8. ICD in place 9. Prostatic hypertrophy 10.Inappropriately low erythropoietin level 11/28/2016 11. Severe pancytopenia secondary to multiple myeloma and Revlimid-improved 12. Fever-likely related to an upper respiratory infection, RSV positive swab 02/02/2017 13.Anemia secondary to renal insufficiency-trial of weekly erythropoietin initiated 06/17/2017.Erythropoietin dose increased to 60,000 units weekly beginning 07/28/2017.  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  The anemia has responded to erythropoietin therapy.  He has not required transfusion for the past 6 weeks.  The erythropoietin will be changed to an every 2-week schedule beginning today.  He will return for an office visit in 1 month.  We will repeat a myeloma panel when he is here next month.  15 minutes were spent with the patient today.  The majority of the time was used for counseling and coordination of care.  [name], MD  09/09/2017  11:02 AM

## 2017-09-10 LAB — PROTEIN ELECTROPHORESIS, SERUM
A/G RATIO SPE: 1.7 (ref 0.7–1.7)
ALPHA-2-GLOBULIN: 0.6 g/dL (ref 0.4–1.0)
Albumin ELP: 3.8 g/dL (ref 2.9–4.4)
Alpha-1-Globulin: 0.2 g/dL (ref 0.0–0.4)
BETA GLOBULIN: 1.3 g/dL (ref 0.7–1.3)
GAMMA GLOBULIN: 0.3 g/dL — AB (ref 0.4–1.8)
Globulin, Total: 2.3 g/dL (ref 2.2–3.9)
M-Spike, %: 0.7 g/dL — ABNORMAL HIGH
Total Protein ELP: 6.1 g/dL (ref 6.0–8.5)

## 2017-09-10 LAB — KAPPA/LAMBDA LIGHT CHAINS
KAPPA, LAMDA LIGHT CHAIN RATIO: 35.64 — AB (ref 0.26–1.65)
Kappa free light chain: 360 mg/L — ABNORMAL HIGH (ref 3.3–19.4)
LAMDA FREE LIGHT CHAINS: 10.1 mg/L (ref 5.7–26.3)

## 2017-09-10 LAB — IGA: IGA: 765 mg/dL — AB (ref 61–437)

## 2017-09-11 ENCOUNTER — Other Ambulatory Visit: Payer: Self-pay

## 2017-09-11 DIAGNOSIS — C9000 Multiple myeloma not having achieved remission: Secondary | ICD-10-CM

## 2017-09-11 MED ORDER — ALPRAZOLAM 0.25 MG PO TABS: 0.2500 mg | ORAL_TABLET | Freq: Every evening | ORAL | 0 refills | Status: DC | PRN

## 2017-09-16 NOTE — Progress Notes (Signed)
Patient ID: [name] Azure, MD, male   DOB: 09/02/1925, 84 y.o.   MRN: [id number]    Cardiology Office Note    Date:  09/17/2017   ID:  [name] Azure, MD, DOB January 23, 1926, MRN [id number]  PCP:  [name], MD  Cardiologist: Dr. Johnsie Cancel Dr. 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   In 2011, he survived a prolonged out of hospital cardiac arrest with ventricular fibrillation. He was found to have CAD with stents in RCA/circumflex and RI. EF 31% with large area of scar by MRI. St Jude AICD subsequently placed. He has not been hospitalized for his heart since then with no AICD d/c and no clinical CHF. Last 2D ECHO in 2015 showed EF 45-50%.   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 Anemia improved with erythropoietin Rx Plavix stopped due to hematologic issues  Grand-daughter Oley Balm is senior at [employer] her boyfriend is with [employer] today and helping him while he tries to reapply to dental school  Lab Results  Component Value Date   CREATININE 1.18 09/09/2017   BUN 34 (H) 09/09/2017   NA 144 09/09/2017   K 4.0 09/09/2017   CL 110 09/09/2017   CO2 22 09/09/2017    BNP    Component Value Date/Time   BNP 124.2 (H) 11/27/2016 1700   BNP 45.9 06/06/2015 1629    ProBNP    Component Value Date/Time   PROBNP 759.3 (H) 11/11/2013 2139      Past Medical History:  Diagnosis Date  . Anemia, unspecified   . CAD (coronary artery disease)   . Cataracts, bilateral   . CHF (congestive  heart failure) ([facility])   . Chronic airway obstruction, not elsewhere classified    on o2 at night  . Diabetes mellitus   . Diverticulosis of colon (without mention of hemorrhage)   . Dyslipidemia   . Emphysema   . Hemorrhoids   . ICD (implantable cardiac defibrillator) in place    st jude  . Ischemic cardiomyopathy   . Macular degeneration   . MI, old 2010 or 2011  . Prostatic hypertrophy    hx of  . Retention of urine, unspecified   . Unspecified disorder resulting from impaired renal function     Past Surgical History:  Procedure Laterality Date  . CARDIAC DEFIBRILLATOR PLACEMENT  2011   st jude  . CATARACT EXTRACTION    . PACEMAKER INSERTION  2011  . pci     4/11  . TONSILLECTOMY  84 yo    Current Medications: Outpatient Medications Prior to Visit  Medication Sig Dispense Refill  . albuterol (PROAIR HFA) 108 (90 Base) MCG/ACT inhaler Inhale 2 puffs into the lungs every 4 (four) hours as needed for wheezing or shortness of breath. 1 Inhaler 3  . albuterol (PROVENTIL) (2.5 MG/3ML) 0.083% nebulizer solution Take 3 mLs (2.5 mg total) every 2 (two) hours as needed by nebulization for wheezing or shortness of breath. 75 mL 0  . ALPRAZolam (XANAX) 0.25 MG  tablet Take 1 tablet (0.25 mg total) by mouth at bedtime as needed for sleep. 30 tablet 0  . arformoterol (BROVANA) 15 MCG/2ML NEBU Take 15 mcg by nebulization 2 (two) times daily.    Marland Kitchen aspirin EC 81 MG tablet Take 1 tablet (81 mg total) by mouth at bedtime. 30 tablet 0  . bisacodyl (DULCOLAX) 5 MG EC tablet Take 5 mg by mouth daily as needed for moderate constipation.    . budesonide (PULMICORT) 0.5 MG/2ML nebulizer solution Take 2 mLs (0.5 mg total) by nebulization 2 (two) times daily. 120 mL 3  . ferrous sulfate 325 (65 FE) MG tablet Take 1 tablet (325 mg total) by mouth 2 (two) times daily with a meal. 60 tablet 0  . fluticasone (FLONASE) 50 MCG/ACT nasal spray Place into both nostrils as directed.    Marland Kitchen guaiFENesin (MUCINEX)  600 MG 12 hr tablet Take 600 mg by mouth 2 (two) times daily as needed for cough or to loosen phlegm.     . loratadine (CLARITIN) 10 MG tablet Take 1 tablet (10 mg total) by mouth daily. 30 tablet 1  . metoprolol tartrate (LOPRESSOR) 25 MG tablet Take 12.5 mg by mouth 2 (two) times daily. Reported on 06/06/2015    . nitroGLYCERIN (NITROSTAT) 0.4 MG SL tablet Place 1 tablet (0.4 mg total) under the tongue every 5 (five) minutes as needed for chest pain. UP TO 3 DOSES THEN CALL EMS 30 tablet 1  . OXYGEN Inhale 2 L into the lungs at bedtime.     . pantoprazole (PROTONIX) 40 MG tablet Take 1 tablet (40 mg total) by mouth daily. 30 tablet 0  . rosuvastatin (CRESTOR) 5 MG tablet Take 5 mg by mouth as directed.    . sodium chloride (OCEAN) 0.65 % SOLN nasal spray Place 2 sprays into both nostrils 2 (two) times daily.  0  . tobramycin (TOBREX) 0.3 % ophthalmic solution     . trimethoprim (TRIMPEX) 100 MG tablet Take 1 tablet (100 mg total) daily by mouth.    . vitamin B-12 1000 MCG tablet Take 1 tablet (1,000 mcg total) daily by mouth.    . vitamin C (VITAMIN C) 250 MG tablet Take 1 tablet (250 mg total) by mouth 2 (two) times daily. 60 tablet 0  . fluticasone (FLONASE) 50 MCG/ACT nasal spray Place 1 spray into both nostrils daily. (Patient not taking: Reported on 09/17/2017) 16 g 2  . rosuvastatin (CRESTOR) 5 MG tablet Take 1 tablet (5 mg total) by mouth daily at 6 PM. (Patient not taking: Reported on 09/17/2017) 90 tablet 3   Facility-Administered Medications Prior to Visit  Medication Dose Route Frequency Provider Last Rate Last Dose  . 0.9 %  sodium chloride infusion  250 mL Intravenous Once Ladell Pier, MD         Allergies:   Patient has no known allergies.   Social History   Socioeconomic History  . Marital status: Widowed    Spouse name: Not on file  . Number of children: Not on file  . Years of education: Not on file  . Highest education level: Not on file  Occupational History  .  Occupation: Engineer, site: [employer]  . Financial resource strain: Not on file  . Food insecurity:    Worry: Not on file    Inability: Not on file  . Transportation needs:    Medical: Not on file    Non-medical: Not on  file  Tobacco Use  . Smoking status: Former Smoker    Types: Cigarettes    Last attempt to quit: 04/27/2009    Years since quitting: 8.3  . Smokeless tobacco: Never Used  Substance and Sexual Activity  . Alcohol use: Yes    Comment: occasional beer  . Drug use: No  . Sexual activity: Never  Lifestyle  . Physical activity:    Days per week: Not on file    Minutes per session: Not on file  . Stress: Not on file  Relationships  . Social connections:    Talks on phone: Not on file    Gets together: Not on file    Attends religious service: Not on file    Active member of club or organization: Not on file    Attends meetings of clubs or organizations: Not on file    Relationship status: Not on file  Other Topics Concern  . Not on file  Social History Narrative   Lives in a house with stairs, which he needs to use, with his wife.  Wife is not able-bodied.  Does not use a cane or walker, but is unsteady on his feet.        [name]:  [phone] cell, [phone], daughter, POA.     Boyd Kerbs:  [phone], nephew   Juluis Pitch:  [phone], daughter           Family History:  The patient's family history includes Atrial fibrillation in his daughter; Breast cancer in his sister; Congestive Heart Failure in his mother; Coronary artery disease in his mother; Heart attack in his brother; Lung cancer in his father; Stroke in his mother.   ROS:   Please see the history of present illness.    ROS All other systems reviewed and are negative.   PHYSICAL EXAM:   VS:  BP 122/70   Pulse 79   Ht 6' (1.829 m)   Wt 148 lb (67.1 kg)   SpO2 93%   BMI 20.07 kg/m    Affect appropriate Elderly [place] male  HEENT:  normal Neck supple with no adenopathy JVP normal no bruits no thyromegaly Lungs clear with no wheezing and good diaphragmatic motion Upper airway mechanical stridor chronic Heart:  S1/S2 no murmur, no rub, gallop or click AICD under left clavicle  PMI normal Abdomen: benighn, BS positve, no tenderness, no AAA no bruit.  No HSM or HJR Distal pulses intact with no bruits Plus one edema with varicosities Neuro non-focal Skin warm and dry No muscular weakness   Wt Readings from Last 3 Encounters:  09/17/17 148 lb (67.1 kg)  09/09/17 148 lb 4.8 oz (67.3 kg)  07/28/17 147 lb 11.2 oz (67 kg)      Studies/Labs Reviewed:   EKG:   10/151/8 HR 77  07/09/16  SR rate 63 normal   Recent Labs: 11/12/2016: Magnesium 2.1 11/27/2016: B Natriuretic Peptide 124.2 06/15/2017: ALT 8 09/09/2017: BUN 34; Creatinine 1.18; Hemoglobin 10.2; Platelet Count 92; Potassium 4.0; Sodium 144   Lipid Panel No results found for: CHOL, TRIG, HDL, CHOLHDL, VLDL, LDLCALC, LDLDIRECT  Additional studies/ records that were reviewed today include:  LHC in 2011: PCI/DES to RI, dLCx and RCA.   2D ECHO: 11/15/2013 LV EF: 45% -  50% Study Conclusions - Left ventricle: Distal septal and apical hypokinesis. The cavity size was mildly dilated. Systolic function was mildly reduced. The estimated ejection fraction was in the range of 45% to 50%. - Aortic valve: Small  systolic gradient without significant stenosis Valve area (VTI): 1.8 cm^2. Valve area (Vmax): 1.72 cm^2. - Mitral valve: Calcified annulus. Mildly thickened leaflets . - Left atrium: The atrium was mildly dilated. - Atrial septum: No defect or patent foramen ovale was identified. - Pulmonary arteries: PA peak pressure: 40 mm Hg (S).  ASSESSMENT:    1. Coronary artery disease involving native coronary artery of native heart without angina pectoris   2. Chronic diastolic CHF (congestive heart failure) ([place])   3. Hyperlipidemia, unspecified  hyperlipidemia type      PLAN:  In order of problems listed above:  Dyspnea:  Improved CHF euvolemic continue current dose diuretic His impedence drops don't Always correlate with elevated BNP. Told him to take lasix when he is dyspnic and transmit Reading from CRT/AICD.  ICM monitoring normal last month   CAD s/p DES to RI, dLCX and RCA (2011):  Asa held . Continue BB and statin.   H/O VF cardiac arrest: s/p St. Jude ICd followed by Dr. Rayann Heman.  HLD: continue statin.   COPD: stable. followed by Dr Gwenette G[facility] suggested 6 minute walk test to qualify for oxygen  Myeloma:  With anemia f/u Dr Annitta Jersey improved with erythropoietin Plavix stopped due to bleeding risk   Daughter [name] who is primary care giver with patient and updated on above    Jenkins Rouge

## 2017-09-17 ENCOUNTER — Encounter: Payer: Self-pay | Admitting: Cardiovascular Disease

## 2017-09-17 ENCOUNTER — Ambulatory Visit (INDEPENDENT_AMBULATORY_CARE_PROVIDER_SITE_OTHER): Payer: Medicare Other | Admitting: Cardiovascular Disease

## 2017-09-17 VITALS — BP 122/70 | HR 79 | Ht 72.0 in | Wt 148.0 lb

## 2017-09-17 DIAGNOSIS — I2510 Atherosclerotic heart disease of native coronary artery without angina pectoris: Secondary | ICD-10-CM

## 2017-09-17 DIAGNOSIS — I255 Ischemic cardiomyopathy: Secondary | ICD-10-CM

## 2017-09-17 DIAGNOSIS — I5032 Chronic diastolic (congestive) heart failure: Secondary | ICD-10-CM | POA: Diagnosis not present

## 2017-09-17 DIAGNOSIS — E785 Hyperlipidemia, unspecified: Secondary | ICD-10-CM

## 2017-09-17 NOTE — Patient Instructions (Signed)

## 2017-09-23 ENCOUNTER — Inpatient Hospital Stay: Payer: Medicare Other

## 2017-09-23 VITALS — BP 132/60 | HR 65

## 2017-09-23 DIAGNOSIS — D631 Anemia in chronic kidney disease: Secondary | ICD-10-CM | POA: Diagnosis not present

## 2017-09-23 DIAGNOSIS — N189 Chronic kidney disease, unspecified: Secondary | ICD-10-CM | POA: Diagnosis not present

## 2017-09-23 DIAGNOSIS — E538 Deficiency of other specified B group vitamins: Secondary | ICD-10-CM

## 2017-09-23 DIAGNOSIS — C9000 Multiple myeloma not having achieved remission: Secondary | ICD-10-CM

## 2017-09-23 LAB — CBC WITH DIFFERENTIAL (CANCER CENTER ONLY)
BASOS ABS: 0 10*3/uL (ref 0.0–0.1)
BASOS PCT: 0 %
EOS ABS: 0.2 10*3/uL (ref 0.0–0.5)
Eosinophils Relative: 3 %
HCT: 31.4 % — ABNORMAL LOW (ref 38.4–49.9)
HEMOGLOBIN: 9.7 g/dL — AB (ref 13.0–17.1)
LYMPHS ABS: 1 10*3/uL (ref 0.9–3.3)
Lymphocytes Relative: 21 %
MCH: 23.2 pg — AB (ref 27.2–33.4)
MCHC: 30.9 g/dL — ABNORMAL LOW (ref 32.0–36.0)
MCV: 74.9 fL — ABNORMAL LOW (ref 79.3–98.0)
Monocytes Absolute: 0.2 10*3/uL (ref 0.1–0.9)
Monocytes Relative: 5 %
NEUTROS PCT: 71 %
Neutro Abs: 3.4 10*3/uL (ref 1.5–6.5)
Platelet Count: 81 10*3/uL — ABNORMAL LOW (ref 140–400)
RBC: 4.19 MIL/uL — AB (ref 4.20–5.82)
RDW: 21.5 % — ABNORMAL HIGH (ref 11.0–14.6)
WBC: 4.8 10*3/uL (ref 4.0–10.3)

## 2017-09-23 MED ORDER — EPOETIN ALFA 20000 UNIT/ML IJ SOLN
60000.0000 [IU] | Freq: Once | INTRAMUSCULAR | Status: AC
  Administered 2017-09-23: 60000 [IU] via SUBCUTANEOUS

## 2017-09-23 MED ORDER — EPOETIN ALFA 40000 UNIT/ML IJ SOLN
INTRAMUSCULAR | Status: AC
  Filled 2017-09-23: qty 1

## 2017-09-23 MED ORDER — EPOETIN ALFA 20000 UNIT/ML IJ SOLN
INTRAMUSCULAR | Status: AC
  Filled 2017-09-23: qty 1

## 2017-09-23 NOTE — Progress Notes (Signed)
Pt. Tolerated injection well, No further problems or concerns noted. 

## 2017-09-23 NOTE — Patient Instructions (Signed)
Epoetin Alfa injection °What is this medicine? °EPOETIN ALFA (e POE e tin AL fa) helps your body make more red blood cells. This medicine is used to treat anemia caused by chronic kidney failure, cancer chemotherapy, or HIV-therapy. It may also be used before surgery if you have anemia. °This medicine may be used for other purposes; ask your health care provider or pharmacist if you have questions. °COMMON BRAND NAME(S): Epogen, Procrit °What should I tell my health care provider before I take this medicine? °They need to know if you have any of these conditions: °-blood clotting disorders °-cancer patient not on chemotherapy °-cystic fibrosis °-heart disease, such as angina or heart failure °-hemoglobin level of 12 g/dL or greater °-high blood pressure °-low levels of folate, iron, or vitamin B12 °-seizures °-an unusual or allergic reaction to erythropoietin, albumin, benzyl alcohol, hamster proteins, other medicines, foods, dyes, or preservatives °-pregnant or trying to get pregnant °-breast-feeding °How should I use this medicine? °This medicine is for injection into a vein or under the skin. It is usually given by a health care professional in a hospital or clinic setting. °If you get this medicine at home, you will be taught how to prepare and give this medicine. Use exactly as directed. Take your medicine at regular intervals. Do not take your medicine more often than directed. °It is important that you put your used needles and syringes in a special sharps container. Do not put them in a trash can. If you do not have a sharps container, call your pharmacist or healthcare provider to get one. °A special MedGuide will be given to you by the pharmacist with each prescription and refill. Be sure to read this information carefully each time. °Talk to your pediatrician regarding the use of this medicine in children. While this drug may be prescribed for selected conditions, precautions do apply. °Overdosage: If you  think you have taken too much of this medicine contact a poison control center or emergency room at once. °NOTE: This medicine is only for you. Do not share this medicine with others. °What if I miss a dose? °If you miss a dose, take it as soon as you can. If it is almost time for your next dose, take only that dose. Do not take double or extra doses. °What may interact with this medicine? °Do not take this medicine with any of the following medications: °-darbepoetin alfa °This list may not describe all possible interactions. Give your health care provider a list of all the medicines, herbs, non-prescription drugs, or dietary supplements you use. Also tell them if you smoke, drink alcohol, or use illegal drugs. Some items may interact with your medicine. °What should I watch for while using this medicine? °Your condition will be monitored carefully while you are receiving this medicine. °You may need blood work done while you are taking this medicine. °What side effects may I notice from receiving this medicine? °Side effects that you should report to your doctor or health care professional as soon as possible: °-allergic reactions like skin rash, itching or hives, swelling of the face, lips, or tongue °-breathing problems °-changes in vision °-chest pain °-confusion, trouble speaking or understanding °-feeling faint or lightheaded, falls °-high blood pressure °-muscle aches or pains °-pain, swelling, warmth in the leg °-rapid weight gain °-severe headaches °-sudden numbness or weakness of the face, arm or leg °-trouble walking, dizziness, loss of balance or coordination °-seizures (convulsions) °-swelling of the ankles, feet, hands °-unusually weak or tired °  Side effects that usually do not require medical attention (report to your doctor or health care professional if they continue or are bothersome): °-diarrhea °-fever, chills (flu-like symptoms) °-headaches °-nausea, vomiting °-redness, stinging, or swelling at  site where injected °This list may not describe all possible side effects. Call your doctor for medical advice about side effects. You may report side effects to FDA at 1-800-FDA-1088. °Where should I keep my medicine? °Keep out of the reach of children. °Store in a refrigerator between 2 and 8 degrees C (36 and 46 degrees F). Do not freeze or shake. Throw away any unused portion if using a single-dose vial. Multi-dose vials can be kept in the refrigerator for up to 21 days after the initial dose. Throw away unused medicine. °NOTE: This sheet is a summary. It may not cover all possible information. If you have questions about this medicine, talk to your doctor, pharmacist, or health care provider. °© 2018 [employer]vier/Gold Standard (2015-09-03 19:42:31) ° °

## 2017-09-25 NOTE — Telephone Encounter (Signed)
err

## 2017-10-06 LAB — CUP PACEART REMOTE DEVICE CHECK
Battery Remaining Percentage: 32 %
Brady Statistic RA Percent Paced: 1 % — CL
HIGH POWER IMPEDANCE MEASURED VALUE: 64 Ohm
Implantable Lead Implant Date: 20111117
Implantable Lead Implant Date: 20111117
Implantable Lead Location: 753859
Implantable Lead Model: 7122
Lead Channel Impedance Value: 360 Ohm
Lead Channel Setting Pacing Amplitude: 2 V
Lead Channel Setting Pacing Amplitude: 2.5 V
Lead Channel Setting Sensing Sensitivity: 0.5 mV
MDC IDC LEAD LOCATION: 753860
MDC IDC MSMT BATTERY REMAINING LONGEVITY: 32 mo
MDC IDC MSMT LEADCHNL RA SENSING INTR AMPL: 2.9 mV
MDC IDC MSMT LEADCHNL RV IMPEDANCE VALUE: 340 Ohm
MDC IDC MSMT LEADCHNL RV SENSING INTR AMPL: 10.6 mV
MDC IDC PG IMPLANT DT: 20111117
MDC IDC SESS DTM: 20190910085556
MDC IDC SET LEADCHNL RV PACING PULSEWIDTH: 0.5 ms
MDC IDC STAT BRADY RV PERCENT PACED: 1 % — AB
Pulse Gen Serial Number: 623396

## 2017-10-07 ENCOUNTER — Inpatient Hospital Stay: Payer: Medicare Other

## 2017-10-07 ENCOUNTER — Telehealth: Payer: Self-pay | Admitting: Oncology

## 2017-10-07 ENCOUNTER — Encounter: Payer: Self-pay | Admitting: Oncology

## 2017-10-07 ENCOUNTER — Inpatient Hospital Stay: Payer: Medicare Other | Attending: Oncology

## 2017-10-07 ENCOUNTER — Inpatient Hospital Stay (HOSPITAL_BASED_OUTPATIENT_CLINIC_OR_DEPARTMENT_OTHER): Payer: Medicare Other | Admitting: Oncology

## 2017-10-07 VITALS — BP 133/85 | HR 72 | Temp 97.9°F | Resp 17 | Ht 72.0 in | Wt 149.9 lb

## 2017-10-07 DIAGNOSIS — E538 Deficiency of other specified B group vitamins: Secondary | ICD-10-CM | POA: Diagnosis not present

## 2017-10-07 DIAGNOSIS — D631 Anemia in chronic kidney disease: Secondary | ICD-10-CM | POA: Insufficient documentation

## 2017-10-07 DIAGNOSIS — D61818 Other pancytopenia: Secondary | ICD-10-CM | POA: Diagnosis not present

## 2017-10-07 DIAGNOSIS — C9000 Multiple myeloma not having achieved remission: Secondary | ICD-10-CM

## 2017-10-07 DIAGNOSIS — Z23 Encounter for immunization: Secondary | ICD-10-CM | POA: Insufficient documentation

## 2017-10-07 DIAGNOSIS — N189 Chronic kidney disease, unspecified: Secondary | ICD-10-CM

## 2017-10-07 LAB — CBC WITH DIFFERENTIAL (CANCER CENTER ONLY)
BASOS PCT: 0 %
Basophils Absolute: 0 10*3/uL (ref 0.0–0.1)
EOS ABS: 0.1 10*3/uL (ref 0.0–0.5)
EOS PCT: 3 %
HCT: 29.7 % — ABNORMAL LOW (ref 38.4–49.9)
Hemoglobin: 9.2 g/dL — ABNORMAL LOW (ref 13.0–17.1)
LYMPHS PCT: 24 %
Lymphs Abs: 0.8 10*3/uL — ABNORMAL LOW (ref 0.9–3.3)
MCH: 22.6 pg — AB (ref 27.2–33.4)
MCHC: 30.8 g/dL — AB (ref 32.0–36.0)
MCV: 73.1 fL — ABNORMAL LOW (ref 79.3–98.0)
Monocytes Absolute: 0.2 10*3/uL (ref 0.1–0.9)
Monocytes Relative: 6 %
Neutro Abs: 2.3 10*3/uL (ref 1.5–6.5)
Neutrophils Relative %: 67 %
PLATELETS: 82 10*3/uL — AB (ref 140–400)
RBC: 4.06 MIL/uL — AB (ref 4.20–5.82)
RDW: 23.6 % — AB (ref 11.0–14.6)
WBC: 3.4 10*3/uL — AB (ref 4.0–10.3)

## 2017-10-07 LAB — BASIC METABOLIC PANEL - CANCER CENTER ONLY
Anion gap: 11 (ref 5–15)
BUN: 34 mg/dL — AB (ref 8–23)
CALCIUM: 9 mg/dL (ref 8.9–10.3)
CO2: 26 mmol/L (ref 22–32)
CREATININE: 1.09 mg/dL (ref 0.61–1.24)
Chloride: 108 mmol/L (ref 98–111)
GFR, EST NON AFRICAN AMERICAN: 58 mL/min — AB (ref >60)
GFR, Est AFR Am: >60 mL/min (ref >60)
GLUCOSE: 111 mg/dL — AB (ref 70–99)
POTASSIUM: 4.3 mmol/L (ref 3.5–5.1)
SODIUM: 145 mmol/L (ref 135–145)

## 2017-10-07 MED ORDER — INFLUENZA VAC SPLIT QUAD 0.5 ML IM SUSY
PREFILLED_SYRINGE | INTRAMUSCULAR | Status: AC
  Filled 2017-10-07: qty 0.5

## 2017-10-07 MED ORDER — INFLUENZA VAC SPLIT QUAD 0.5 ML IM SUSY
0.5000 mL | PREFILLED_SYRINGE | Freq: Once | INTRAMUSCULAR | Status: AC
  Administered 2017-10-07: 0.5 mL via INTRAMUSCULAR

## 2017-10-07 MED ORDER — EPOETIN ALFA 20000 UNIT/ML IJ SOLN
60000.0000 [IU] | Freq: Once | INTRAMUSCULAR | Status: AC
  Administered 2017-10-07: 60000 [IU] via SUBCUTANEOUS

## 2017-10-07 MED ORDER — EPOETIN ALFA 40000 UNIT/ML IJ SOLN
INTRAMUSCULAR | Status: AC
  Filled 2017-10-07: qty 1

## 2017-10-07 MED ORDER — EPOETIN ALFA 20000 UNIT/ML IJ SOLN
INTRAMUSCULAR | Status: AC
  Filled 2017-10-07: qty 1

## 2017-10-07 NOTE — Telephone Encounter (Signed)
Called daughter - unable to leave message vmail full - called second number and left message oer 9/11 los - with appt date and time / also sent reminder letter in the mail.

## 2017-10-07 NOTE — Progress Notes (Signed)
  [place] OFFICE PROGRESS NOTE   Diagnosis: Multiple myeloma, anemia  INTERVAL HISTORY:   [name] returns as scheduled.  [name] remains off of specific therapy for multiple myeloma.  [name] last received erythropoietin on 09/23/2017.  [name] feels well.  No complaint.  No symptom of thrombosis.  Objective:  Vital signs in last 24 hours:  Blood pressure 133/85, pulse 72, temperature 97.9 F (36.6 C), temperature source Oral, resp. rate 17, height 6' (1.829 m), weight 149 lb 14.4 oz (68 kg), SpO2 100 %.    Resp: Distant breath sounds, inspiratory rhonchi at the left posterior base, no respiratory distress Cardio: Irregular GI: No hepatosplenomegaly Vascular: No leg edema   Lab Results:  Lab Results  Component Value Date   WBC 3.4 (L) 10/07/2017   HGB 9.2 (L) 10/07/2017   HCT 29.7 (L) 10/07/2017   MCV 73.1 (L) 10/07/2017   PLT 82 (L) 10/07/2017   NEUTROABS 2.3 10/07/2017    CMP  Lab Results  Component Value Date   NA 145 10/07/2017   K 4.3 10/07/2017   CL 108 10/07/2017   CO2 26 10/07/2017   GLUCOSE 111 (H) 10/07/2017   BUN 34 (H) 10/07/2017   CREATININE 1.09 10/07/2017   CALCIUM 9.0 10/07/2017   PROT 5.5 (L) 06/15/2017   ALBUMIN 3.1 (L) 06/15/2017   AST 9 06/15/2017   ALT 8 06/15/2017   ALKPHOS 53 06/15/2017   BILITOT 0.4 06/15/2017   GFRNONAA 58 (L) 10/07/2017   GFRAA >60 10/07/2017     Medications: I have reviewed the patient's current medications.   Assessment/Plan: 1. Pancytopenia-transfused with packed red blood cells 11/28/2016, 12/27/2016, 01/13/2017, 02/02/2017 2. History of beta thalassemia trait 3. History of vitamin B-12 deficiency-vitamin B-12 replacement initiated 11/11/2016 4. History of a serum monoclonal IgAkappaprotein-elevated serum free kappa light chains, IgA, and M spike10/16/2018  Bone marrow biopsy 12/03/2016-consistent with multiple myeloma  Bone survey 12/06/2016-no destructive or lytic bone lesions  identified.  Revlimid 21 days on/7 days off and weekly dexamethasone 12/16/2016(Decadron discontinued 12/29/2016 secondary to altered mental status)  Cycle 2 single agent Revlimid 01/14/2017(discontinued 3 days early)  Serum light chains, IgAimproved 02/02/2017  Cycle3 single agent Revlimid beginning 02/16/2017  Cycle4single agent Revlimid beginning 03/16/2017  Cycle 5 single agent Revlimid beginning 04/14/2017(Revlimid placed on hold beginning 04/28/2017 due to thrombocytopenia)  Cycle 6 single agent Revlimid beginning 05/23/2017 x 14 days 5. COPD with acute and chronic respiratory failure/hypoxemia 6. History of coronary artery disease 7. Ischemic cardiomyopathy 8. ICD in place 9. Prostatic hypertrophy 10.Inappropriately low erythropoietin level 11/28/2016 11. Severe pancytopenia secondary to multiple myeloma and Revlimid-improved 12. Fever-likely related to an upper respiratory infection, RSV positive swab 02/02/2017 13.Anemia secondary to renal insufficiency-trial of weekly erythropoietin initiated 06/17/2017.Erythropoietin dose increased to 60,000 units weekly beginning 07/28/2017.  Erythropoietin changed to every 2 weeks beginning8/14/2019    Disposition: [name] appears stable.  The anemia has responded to erythropoietin therapy.  The plan is to continue erythropoietin every 2 weeks.  The IgA level and free kappa light chains were higher last month.  [name] remains off of specific therapy for myeloma.  We will follow-up on the myeloma panel from today.  [name] will return for erythropoietin in 2 weeks and an office visit in 4 weeks.  [name] received an influenza vaccine today.   [name], MD  10/07/2017  11:04 AM

## 2017-10-08 ENCOUNTER — Ambulatory Visit (INDEPENDENT_AMBULATORY_CARE_PROVIDER_SITE_OTHER): Payer: Medicare Other

## 2017-10-08 DIAGNOSIS — I5032 Chronic diastolic (congestive) heart failure: Secondary | ICD-10-CM

## 2017-10-08 DIAGNOSIS — Z95810 Presence of automatic (implantable) cardiac defibrillator: Secondary | ICD-10-CM | POA: Diagnosis not present

## 2017-10-08 LAB — KAPPA/LAMBDA LIGHT CHAINS
KAPPA FREE LGHT CHN: 416.1 mg/L — AB (ref 3.3–19.4)
KAPPA, LAMDA LIGHT CHAIN RATIO: 51.37 — AB (ref 0.26–1.65)
Lambda free light chains: 8.1 mg/L (ref 5.7–26.3)

## 2017-10-08 LAB — IGA: IGA: 799 mg/dL — AB (ref 61–437)

## 2017-10-08 NOTE — Progress Notes (Signed)
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  Patient has not had any complaints but she will call him to make sure he is feeling okay.  Advised Lasix is not listed in medical record but Dr Kyla Balzarine note said he could take Lasix as needed.  She said it is difficult for him to take Lasix because he is self cathing.  Advised if he is having any fluid symptoms to call back and if he is asymptomatic then have him limit salt intake and will recheck fluid levels on 10/19/2017.     Thoracic impedance abnormal suggesting fluid accumulation starting 10/03/2017 but is trending back closer to baseline.  No diuretic.  Dr Kyla Balzarine office note 09/17/17 says to take Lasix when dyspneic and send transmission but no Lasix listed in med list.   Labs: 10/07/2017 Creatinine 1.09, BUN 34, Potassium 4.3, Sodium 145, EGFR 58->60 09/09/2017 Creatinine 1.18, BUN 34, Potassium 4.0, Sodium 144, EGFR 52->60  06/15/2017 Creatinine 1.30, BUN 33, Potassium 3.5, Sodium 143, EGFR 47-54  06/01/2017 Creatinine 1.29, BUN 27, Potassium 4.6, Sodium 142, EGFR 47-55  04/27/2017 Creatinine 1.14, BUN 24, Potassium 3.9, Sodium 143, EGFR 55->60  A complete set of results can be found in Results Review.  Recommendations:  Advised to limit salt intake and to call back if he develops any fluid symptoms.   Follow-up plan: ICM clinic phone appointment on 10/19/2017 to recheck fluid levels.       Copy of ICM check sent to Dr. Rayann Heman and [name] for review and recommendations if needed.   3 month ICM trend: 10/08/2017    1 Year ICM trend:       Rosalene Billings, RN 10/08/2017 10:34 AM

## 2017-10-09 ENCOUNTER — Other Ambulatory Visit: Payer: Self-pay | Admitting: Cardiovascular Disease

## 2017-10-14 DIAGNOSIS — N401 Enlarged prostate with lower urinary tract symptoms: Secondary | ICD-10-CM | POA: Diagnosis not present

## 2017-10-14 DIAGNOSIS — R339 Retention of urine, unspecified: Secondary | ICD-10-CM | POA: Diagnosis not present

## 2017-10-14 DIAGNOSIS — N3020 Other chronic cystitis without hematuria: Secondary | ICD-10-CM | POA: Diagnosis not present

## 2017-10-19 ENCOUNTER — Ambulatory Visit (INDEPENDENT_AMBULATORY_CARE_PROVIDER_SITE_OTHER): Payer: Medicare Other

## 2017-10-19 ENCOUNTER — Telehealth: Payer: Self-pay

## 2017-10-19 DIAGNOSIS — I5032 Chronic diastolic (congestive) heart failure: Secondary | ICD-10-CM

## 2017-10-19 DIAGNOSIS — Z95810 Presence of automatic (implantable) cardiac defibrillator: Secondary | ICD-10-CM

## 2017-10-19 NOTE — Progress Notes (Signed)
EPIC Encounter for ICM Monitoring  Patient Name: [name] Morua, [name] [name] is a 84 y.o. male Date: 10/19/2017 Primary Care Physican: [name], [name] [name] Primary Cardiologist:Nishan Electrophysiologist: Allred Dry Weight:unknown                                                    Attempted call to daughter and unable to reach.  Transmission reviewed.   Thoracic impedance returned to normal since 10/08/2017 transmission.  No diuretic.  Dr Kyla Balzarine office note 09/17/17 says to take Lasix when dyspneic and send transmission but no Lasix listed in med list.   Labs: 10/07/2017 Creatinine 1.09, BUN 34, Potassium 4.3, Sodium 145, EGFR 58->60 09/09/2017 Creatinine 1.18, BUN 34, Potassium 4.0, Sodium 144, EGFR 52->60  06/15/2017 Creatinine 1.30, BUN 33, Potassium 3.5, Sodium 143, EGFR 47-54  06/01/2017 Creatinine 1.29, BUN 27, Potassium 4.6, Sodium 142, EGFR 47-55  04/27/2017 Creatinine 1.14, BUN 24, Potassium 3.9, Sodium 143, EGFR 55->60  A complete set of results can be found in Results Review.  Recommendations: NONE - Unable to reach.  Follow-up plan: ICM clinic phone appointment on 11/23/2017.     Copy of ICM check sent to Dr. Rayann Heman.   3 month ICM trend: 10/19/2017    1 Year ICM trend:       Rosalene Billings, RN 10/19/2017 9:36 AM

## 2017-10-19 NOTE — Telephone Encounter (Signed)
Remote ICM transmission received.  Attempted call to daughter and mail box is full

## 2017-10-21 ENCOUNTER — Inpatient Hospital Stay: Payer: Medicare Other

## 2017-10-21 VITALS — BP 139/76 | HR 65 | Temp 97.7°F | Resp 17

## 2017-10-21 DIAGNOSIS — Z23 Encounter for immunization: Secondary | ICD-10-CM | POA: Diagnosis not present

## 2017-10-21 DIAGNOSIS — C9000 Multiple myeloma not having achieved remission: Secondary | ICD-10-CM | POA: Diagnosis not present

## 2017-10-21 DIAGNOSIS — N189 Chronic kidney disease, unspecified: Secondary | ICD-10-CM | POA: Diagnosis not present

## 2017-10-21 DIAGNOSIS — E538 Deficiency of other specified B group vitamins: Secondary | ICD-10-CM

## 2017-10-21 DIAGNOSIS — D61818 Other pancytopenia: Secondary | ICD-10-CM | POA: Diagnosis not present

## 2017-10-21 DIAGNOSIS — D631 Anemia in chronic kidney disease: Secondary | ICD-10-CM | POA: Diagnosis not present

## 2017-10-21 LAB — CBC WITH DIFFERENTIAL (CANCER CENTER ONLY)
BASOS ABS: 0 10*3/uL (ref 0.0–0.1)
BASOS PCT: 0 %
EOS ABS: 0.2 10*3/uL (ref 0.0–0.5)
Eosinophils Relative: 4 %
HEMATOCRIT: 29 % — AB (ref 38.4–49.9)
HEMOGLOBIN: 8.8 g/dL — AB (ref 13.0–17.1)
LYMPHS ABS: 1 10*3/uL (ref 0.9–3.3)
Lymphocytes Relative: 24 %
MCH: 22.2 pg — ABNORMAL LOW (ref 27.2–33.4)
MCHC: 30.3 g/dL — ABNORMAL LOW (ref 32.0–36.0)
MCV: 73.2 fL — ABNORMAL LOW (ref 79.3–98.0)
MONOS PCT: 6 %
Monocytes Absolute: 0.3 10*3/uL (ref 0.1–0.9)
NEUTROS ABS: 2.7 10*3/uL (ref 1.5–6.5)
Neutrophils Relative %: 66 %
Platelet Count: 81 10*3/uL — ABNORMAL LOW (ref 140–400)
RBC: 3.96 MIL/uL — ABNORMAL LOW (ref 4.20–5.82)
RDW: 21.9 % — ABNORMAL HIGH (ref 11.0–14.6)
WBC Count: 4.2 10*3/uL (ref 4.0–10.3)

## 2017-10-21 MED ORDER — EPOETIN ALFA 40000 UNIT/ML IJ SOLN
INTRAMUSCULAR | Status: AC
  Filled 2017-10-21: qty 1

## 2017-10-21 MED ORDER — EPOETIN ALFA 20000 UNIT/ML IJ SOLN
INTRAMUSCULAR | Status: AC
  Filled 2017-10-21: qty 1

## 2017-10-21 MED ORDER — EPOETIN ALFA 20000 UNIT/ML IJ SOLN
60000.0000 [IU] | Freq: Once | INTRAMUSCULAR | Status: AC
  Administered 2017-10-21: 60000 [IU] via SUBCUTANEOUS

## 2017-10-21 NOTE — Patient Instructions (Signed)
Epoetin Alfa injection °What is this medicine? °EPOETIN ALFA (e POE e tin AL fa) helps your body make more red blood cells. This medicine is used to treat anemia caused by chronic kidney failure, cancer chemotherapy, or HIV-therapy. It may also be used before surgery if you have anemia. °This medicine may be used for other purposes; ask your health care provider or pharmacist if you have questions. °COMMON BRAND NAME(S): Epogen, Procrit °What should I tell my health care provider before I take this medicine? °They need to know if you have any of these conditions: °-blood clotting disorders °-cancer patient not on chemotherapy °-cystic fibrosis °-heart disease, such as angina or heart failure °-hemoglobin level of 12 g/dL or greater °-high blood pressure °-low levels of folate, iron, or vitamin B12 °-seizures °-an unusual or allergic reaction to erythropoietin, albumin, benzyl alcohol, hamster proteins, other medicines, foods, dyes, or preservatives °-pregnant or trying to get pregnant °-breast-feeding °How should I use this medicine? °This medicine is for injection into a vein or under the skin. It is usually given by a health care professional in a hospital or clinic setting. °If you get this medicine at home, you will be taught how to prepare and give this medicine. Use exactly as directed. Take your medicine at regular intervals. Do not take your medicine more often than directed. °It is important that you put your used needles and syringes in a special sharps container. Do not put them in a trash can. If you do not have a sharps container, call your pharmacist or healthcare provider to get one. °A special MedGuide will be given to you by the pharmacist with each prescription and refill. Be sure to read this information carefully each time. °Talk to your pediatrician regarding the use of this medicine in children. While this drug may be prescribed for selected conditions, precautions do apply. °Overdosage: If you  think you have taken too much of this medicine contact a poison control center or emergency room at once. °NOTE: This medicine is only for you. Do not share this medicine with others. °What if I miss a dose? °If you miss a dose, take it as soon as you can. If it is almost time for your next dose, take only that dose. Do not take double or extra doses. °What may interact with this medicine? °Do not take this medicine with any of the following medications: °-darbepoetin alfa °This list may not describe all possible interactions. Give your health care provider a list of all the medicines, herbs, non-prescription drugs, or dietary supplements you use. Also tell them if you smoke, drink alcohol, or use illegal drugs. Some items may interact with your medicine. °What should I watch for while using this medicine? °Your condition will be monitored carefully while you are receiving this medicine. °You may need blood work done while you are taking this medicine. °What side effects may I notice from receiving this medicine? °Side effects that you should report to your doctor or health care professional as soon as possible: °-allergic reactions like skin rash, itching or hives, swelling of the face, lips, or tongue °-breathing problems °-changes in vision °-chest pain °-confusion, trouble speaking or understanding °-feeling faint or lightheaded, falls °-high blood pressure °-muscle aches or pains °-pain, swelling, warmth in the leg °-rapid weight gain °-severe headaches °-sudden numbness or weakness of the face, arm or leg °-trouble walking, dizziness, loss of balance or coordination °-seizures (convulsions) °-swelling of the ankles, feet, hands °-unusually weak or tired °  Side effects that usually do not require medical attention (report to your doctor or health care professional if they continue or are bothersome): °-diarrhea °-fever, chills (flu-like symptoms) °-headaches °-nausea, vomiting °-redness, stinging, or swelling at  site where injected °This list may not describe all possible side effects. Call your doctor for medical advice about side effects. You may report side effects to FDA at 1-800-FDA-1088. °Where should I keep my medicine? °Keep out of the reach of children. °Store in a refrigerator between 2 and 8 degrees C (36 and 46 degrees F). Do not freeze or shake. Throw away any unused portion if using a single-dose vial. Multi-dose vials can be kept in the refrigerator for up to 21 days after the initial dose. Throw away unused medicine. °NOTE: This sheet is a summary. It may not cover all possible information. If you have questions about this medicine, talk to your doctor, pharmacist, or health care provider. °© 2018 [employer]vier/Gold Standard (2015-09-03 19:42:31) ° °

## 2017-10-30 DIAGNOSIS — H35371 Puckering of macula, right eye: Secondary | ICD-10-CM | POA: Diagnosis not present

## 2017-10-30 DIAGNOSIS — H43813 Vitreous degeneration, bilateral: Secondary | ICD-10-CM | POA: Diagnosis not present

## 2017-10-30 DIAGNOSIS — H353221 Exudative age-related macular degeneration, left eye, with active choroidal neovascularization: Secondary | ICD-10-CM | POA: Diagnosis not present

## 2017-10-30 DIAGNOSIS — H353112 Nonexudative age-related macular degeneration, right eye, intermediate dry stage: Secondary | ICD-10-CM | POA: Diagnosis not present

## 2017-11-04 ENCOUNTER — Inpatient Hospital Stay: Payer: Medicare Other

## 2017-11-04 ENCOUNTER — Inpatient Hospital Stay: Payer: Medicare Other | Admitting: Nurse Practitioner

## 2017-11-04 MED ORDER — EPOETIN ALFA 20000 UNIT/ML IJ SOLN
INTRAMUSCULAR | Status: AC
  Filled 2017-11-04: qty 1

## 2017-11-04 MED ORDER — EPOETIN ALFA 40000 UNIT/ML IJ SOLN
INTRAMUSCULAR | Status: AC
  Filled 2017-11-04: qty 1

## 2017-11-06 ENCOUNTER — Inpatient Hospital Stay (HOSPITAL_BASED_OUTPATIENT_CLINIC_OR_DEPARTMENT_OTHER): Payer: Medicare Other | Admitting: Nurse Practitioner

## 2017-11-06 ENCOUNTER — Inpatient Hospital Stay: Payer: Medicare Other | Attending: Oncology

## 2017-11-06 ENCOUNTER — Inpatient Hospital Stay: Payer: Medicare Other

## 2017-11-06 VITALS — BP 137/73 | HR 65 | Temp 98.4°F | Resp 17 | Ht 72.0 in | Wt 150.4 lb

## 2017-11-06 DIAGNOSIS — D631 Anemia in chronic kidney disease: Secondary | ICD-10-CM | POA: Diagnosis not present

## 2017-11-06 DIAGNOSIS — D61818 Other pancytopenia: Secondary | ICD-10-CM

## 2017-11-06 DIAGNOSIS — N189 Chronic kidney disease, unspecified: Secondary | ICD-10-CM | POA: Insufficient documentation

## 2017-11-06 DIAGNOSIS — C9000 Multiple myeloma not having achieved remission: Secondary | ICD-10-CM

## 2017-11-06 DIAGNOSIS — D61810 Antineoplastic chemotherapy induced pancytopenia: Secondary | ICD-10-CM | POA: Insufficient documentation

## 2017-11-06 DIAGNOSIS — E538 Deficiency of other specified B group vitamins: Secondary | ICD-10-CM

## 2017-11-06 LAB — CBC WITH DIFFERENTIAL (CANCER CENTER ONLY)
ABS IMMATURE GRANULOCYTES: 0.03 10*3/uL (ref 0.00–0.07)
BASOS PCT: 0 %
Basophils Absolute: 0 10*3/uL (ref 0.0–0.1)
EOS ABS: 0.2 10*3/uL (ref 0.0–0.5)
Eosinophils Relative: 4 %
HCT: 28 % — ABNORMAL LOW (ref 39.0–52.0)
Hemoglobin: 8.4 g/dL — ABNORMAL LOW (ref 13.0–17.0)
Immature Granulocytes: 1 %
Lymphocytes Relative: 23 %
Lymphs Abs: 0.8 10*3/uL (ref 0.7–4.0)
MCH: 22.3 pg — AB (ref 26.0–34.0)
MCHC: 30 g/dL (ref 30.0–36.0)
MCV: 74.3 fL — AB (ref 80.0–100.0)
MONO ABS: 0.2 10*3/uL (ref 0.1–1.0)
MONOS PCT: 5 %
Neutro Abs: 2.3 10*3/uL (ref 1.7–7.7)
Neutrophils Relative %: 67 %
PLATELETS: 107 10*3/uL — AB (ref 150–400)
RBC: 3.77 MIL/uL — ABNORMAL LOW (ref 4.22–5.81)
RDW: 21.9 % — AB (ref 11.5–15.5)
WBC: 3.5 10*3/uL — AB (ref 4.0–10.5)
nRBC: 0 % (ref 0.0–0.2)

## 2017-11-06 LAB — BASIC METABOLIC PANEL - CANCER CENTER ONLY
Anion gap: 10 (ref 5–15)
BUN: 25 mg/dL — AB (ref 8–23)
CALCIUM: 8.6 mg/dL — AB (ref 8.9–10.3)
CO2: 25 mmol/L (ref 22–32)
Chloride: 108 mmol/L (ref 98–111)
Creatinine: 1.1 mg/dL (ref 0.61–1.24)
GFR, EST NON AFRICAN AMERICAN: 57 mL/min — AB (ref >60)
GFR, Est AFR Am: >60 mL/min (ref >60)
GLUCOSE: 147 mg/dL — AB (ref 70–99)
Potassium: 3.8 mmol/L (ref 3.5–5.1)
SODIUM: 143 mmol/L (ref 135–145)

## 2017-11-06 MED ORDER — EPOETIN ALFA 20000 UNIT/ML IJ SOLN
60000.0000 [IU] | Freq: Once | INTRAMUSCULAR | Status: AC
  Administered 2017-11-06: 60000 [IU] via SUBCUTANEOUS

## 2017-11-06 MED ORDER — EPOETIN ALFA 20000 UNIT/ML IJ SOLN
INTRAMUSCULAR | Status: AC
  Filled 2017-11-06: qty 1

## 2017-11-06 MED ORDER — EPOETIN ALFA 40000 UNIT/ML IJ SOLN
INTRAMUSCULAR | Status: AC
  Filled 2017-11-06: qty 1

## 2017-11-06 NOTE — Progress Notes (Addendum)
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  He remains off of specific therapy for multiple myeloma.  He continues erythropoietin every 2 weeks.  He reports that overall he feels well.  He denies shortness of breath.  No bleeding.  He does note easy bruising.  He denies pain.  He reports a good appetite.  Objective:  Vital signs in last 24 hours:  Blood pressure 137/73, pulse 65, temperature 98.4 F (36.9 C), temperature source Oral, resp. rate 17, height 6' (1.829 m), weight 150 lb 6.4 oz (68.2 kg), SpO2 100 %.    HEENT: Subconjunctival hemorrhage involving the left lateral eye. Resp: Distant breath sounds. Cardio: Irregular. GI: Abdomen soft and nontender.  No hepatosplenomegaly. Vascular: Trace bilateral pretibial edema.   Lab Results:  Lab Results  Component Value Date   WBC 3.5 (L) 11/06/2017   HGB 8.4 (L) 11/06/2017   HCT 28.0 (L) 11/06/2017   MCV 74.3 (L) 11/06/2017   PLT 107 (L) 11/06/2017   NEUTROABS 2.3 11/06/2017    Imaging:  No results found.  Medications: I have reviewed the patient's current medications.  Assessment/Plan: 1. Pancytopenia-transfused with packed red blood cells 11/28/2016, 12/27/2016, 01/13/2017, 02/02/2017 2. History of beta thalassemia trait 3. History of vitamin B-12 deficiency-vitamin B-12 replacement initiated 11/11/2016 4. History of a serum monoclonal IgAkappaprotein-elevated serum free kappa light chains, IgA, and M spike10/16/2018  Bone marrow biopsy 12/03/2016-consistent with multiple myeloma  Bone survey 12/06/2016-no destructive or lytic bone lesions identified.  Revlimid 21 days on/7 days off and weekly dexamethasone 12/16/2016(Decadron discontinued 12/29/2016 secondary to altered mental status)  Cycle 2 single agent Revlimid 01/14/2017(discontinued 3 days early)  Serum light chains, IgAimproved 02/02/2017  Cycle3 single  agent Revlimid beginning 02/16/2017  Cycle4single agent Revlimid beginning 03/16/2017  Cycle 5 single agent Revlimid beginning 04/14/2017(Revlimid placed on hold beginning 04/28/2017 due to thrombocytopenia)  Cycle 6 single agent Revlimid beginning 05/23/2017 x 14 days 5. COPD with acute and chronic respiratory failure/hypoxemia 6. History of coronary artery disease 7. Ischemic cardiomyopathy 8. ICD in place 9. Prostatic hypertrophy 10.Inappropriately low erythropoietin level 11/28/2016 11. Severe pancytopenia secondary to multiple myeloma and Revlimid-improved 12. Fever-likely related to an upper respiratory infection, RSV positive swab 02/02/2017 13.Anemia secondary to renal insufficiency-trial of weekly erythropoietin initiated 06/17/2017.Erythropoietin dose increased to 60,000 units weekly beginning 07/28/2017.  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  He remains off of specific therapy for myeloma.  We reviewed the results of the myeloma panel from 10/07/2017 and will follow-up on the results from today.  We reviewed the CBC from today.  Hemoglobin appears to be slowly declining.  He is asymptomatic.  Plan to continue every 2-week erythropoietin injections.  He will return for lab, follow-up and an erythropoietin injection in 4 weeks.  He will contact the office in the interim with any problems.  Patient seen with Dr. [name].        Ned Card ANP/GNP-BC   11/06/2017  11:45 AM  This was a shared visit with Ned Card.  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  The hemoglobin is slightly lower, but he has not required a red cell transfusion in several months.  The plan is to continue erythropoietin therapy.  We will consider increasing the erythropoietin dose if the hemoglobin falls further.  He remains off of specific therapy for myeloma.  We will follow-up on the myeloma panel  from today.  Julieanne Manson,  MD

## 2017-11-07 LAB — IGA: IgA: 980 mg/dL — ABNORMAL HIGH (ref 61–437)

## 2017-11-09 LAB — KAPPA/LAMBDA LIGHT CHAINS
KAPPA FREE LGHT CHN: 408.7 mg/L — AB (ref 3.3–19.4)
Kappa, lambda light chain ratio: 53.78 — ABNORMAL HIGH (ref 0.26–1.65)
LAMDA FREE LIGHT CHAINS: 7.6 mg/L (ref 5.7–26.3)

## 2017-11-18 ENCOUNTER — Ambulatory Visit: Payer: Medicare Other

## 2017-11-18 ENCOUNTER — Other Ambulatory Visit: Payer: Medicare Other

## 2017-11-18 ENCOUNTER — Inpatient Hospital Stay: Payer: Medicare Other

## 2017-11-18 ENCOUNTER — Other Ambulatory Visit: Payer: Self-pay | Admitting: *Deleted

## 2017-11-18 VITALS — BP 137/74 | HR 64 | Temp 98.2°F | Resp 17

## 2017-11-18 DIAGNOSIS — D61810 Antineoplastic chemotherapy induced pancytopenia: Secondary | ICD-10-CM | POA: Diagnosis not present

## 2017-11-18 DIAGNOSIS — D61818 Other pancytopenia: Secondary | ICD-10-CM

## 2017-11-18 DIAGNOSIS — E538 Deficiency of other specified B group vitamins: Secondary | ICD-10-CM

## 2017-11-18 DIAGNOSIS — C9000 Multiple myeloma not having achieved remission: Secondary | ICD-10-CM | POA: Diagnosis not present

## 2017-11-18 DIAGNOSIS — N189 Chronic kidney disease, unspecified: Secondary | ICD-10-CM | POA: Diagnosis not present

## 2017-11-18 DIAGNOSIS — D631 Anemia in chronic kidney disease: Secondary | ICD-10-CM | POA: Diagnosis not present

## 2017-11-18 LAB — CBC WITH DIFFERENTIAL (CANCER CENTER ONLY)
Abs Immature Granulocytes: 0.02 10*3/uL (ref 0.00–0.07)
BASOS ABS: 0 10*3/uL (ref 0.0–0.1)
BASOS PCT: 0 %
Eosinophils Absolute: 0.1 10*3/uL (ref 0.0–0.5)
Eosinophils Relative: 4 %
HEMATOCRIT: 26.9 % — AB (ref 39.0–52.0)
Hemoglobin: 7.9 g/dL — ABNORMAL LOW (ref 13.0–17.0)
Immature Granulocytes: 1 %
LYMPHS ABS: 0.9 10*3/uL (ref 0.7–4.0)
Lymphocytes Relative: 27 %
MCH: 22 pg — ABNORMAL LOW (ref 26.0–34.0)
MCHC: 29.4 g/dL — AB (ref 30.0–36.0)
MCV: 74.9 fL — AB (ref 80.0–100.0)
Monocytes Absolute: 0.2 10*3/uL (ref 0.1–1.0)
Monocytes Relative: 6 %
NEUTROS ABS: 1.9 10*3/uL (ref 1.7–7.7)
NEUTROS PCT: 62 %
NRBC: 0.6 % — AB (ref 0.0–0.2)
Platelet Count: 93 10*3/uL — ABNORMAL LOW (ref 150–400)
RBC: 3.59 MIL/uL — ABNORMAL LOW (ref 4.22–5.81)
RDW: 22.5 % — AB (ref 11.5–15.5)
WBC Count: 3.2 10*3/uL — ABNORMAL LOW (ref 4.0–10.5)

## 2017-11-18 MED ORDER — EPOETIN ALFA 20000 UNIT/ML IJ SOLN
60000.0000 [IU] | Freq: Once | INTRAMUSCULAR | Status: AC
  Administered 2017-11-18: 60000 [IU] via SUBCUTANEOUS

## 2017-11-18 MED ORDER — EPOETIN ALFA 20000 UNIT/ML IJ SOLN
INTRAMUSCULAR | Status: AC
  Filled 2017-11-18: qty 1

## 2017-11-18 MED ORDER — EPOETIN ALFA 40000 UNIT/ML IJ SOLN
INTRAMUSCULAR | Status: AC
  Filled 2017-11-18: qty 1

## 2017-11-18 NOTE — Progress Notes (Signed)
Pt Hgb is 7.9 today. I notified Dr. [name] nurse. Pt says he feels fine and his vital signs are normal for him. Gave pt his procrit shot and told him to call us if he starts to have any symptoms.   Kasandra Knudsen LPN

## 2017-11-23 ENCOUNTER — Ambulatory Visit (INDEPENDENT_AMBULATORY_CARE_PROVIDER_SITE_OTHER): Payer: Medicare Other

## 2017-11-23 ENCOUNTER — Ambulatory Visit (INDEPENDENT_AMBULATORY_CARE_PROVIDER_SITE_OTHER): Payer: Medicare Other | Admitting: *Deleted

## 2017-11-23 ENCOUNTER — Telehealth: Payer: Self-pay | Admitting: *Deleted

## 2017-11-23 DIAGNOSIS — I5032 Chronic diastolic (congestive) heart failure: Secondary | ICD-10-CM

## 2017-11-23 DIAGNOSIS — Z95810 Presence of automatic (implantable) cardiac defibrillator: Secondary | ICD-10-CM

## 2017-11-23 DIAGNOSIS — I255 Ischemic cardiomyopathy: Secondary | ICD-10-CM

## 2017-11-23 NOTE — Progress Notes (Signed)
Remote ICD transmission.   

## 2017-11-23 NOTE — Progress Notes (Signed)
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 She reported patient had some had swelling yesterday but he said that was due to he bumped his hand against something.  No changes in weight that she is aware of.  She is unaware of any changes in diet at this time.       Daughter reported Lasix was stopped due to patient self caths and Lasix causes increase in urine so he has to self cath more frequently.    Thoracic impedance has significantly dropped below baseline suggesting it is abnormal for fluid accumulation starting 11/15/2017.    Prescribed: Dr Kyla Balzarine office note 09/17/17 says to take Lasix when dyspneic and send transmission but no Lasix listed in med list.   Labs: 11/06/2017 Creatinine 1.10, BUN 25, Potassium 3.8, Sodium 143, eGFR 57->60 10/07/2017 Creatinine1.09, BUN34, Potassium4.3, [id number], EGFR58->60 09/09/2017 Creatinine1.18, BUN34, Potassium4.0, [id number], EGFR52->60  06/15/2017 Creatinine1.30, BUN33, Potassium3.5, [id number], [id number]  06/01/2017 Creatinine1.29, BUN27, Potassium4.6, [id number], [id number]  04/27/2017 Creatinine1.14, BUN24, Potassium3.9, [id number], EGFR55->60 A complete set of results can be found in Results Review.  Recommendations:  Copy of ICM check sent to Dr. Rayann Heman and [name] for review and recommendation if needed.   Follow-up plan: ICM clinic phone appointment on 12/01/2017 to recheck fluid levels.      3 month ICM trend: 11/23/2017    1 Year ICM trend:       Rosalene Billings, RN 11/23/2017 2:35 PM

## 2017-11-23 NOTE — Telephone Encounter (Addendum)
Received call from daughter, [name] asking if Dr [name] has pt on anything that would increase risk of CHF. She states that pt has been told that he has a lot of fluid around heart & this has increased.  Pt is on aranesp.  Message to Dr [name].  OK to leave vm for daughter.  Called daughter & informed that Dr [name] didn't think that anything we were doing was causing his increase in fluid.

## 2017-11-25 ENCOUNTER — Telehealth: Payer: Self-pay

## 2017-11-25 DIAGNOSIS — R0602 Shortness of breath: Secondary | ICD-10-CM

## 2017-11-25 DIAGNOSIS — R600 Localized edema: Secondary | ICD-10-CM

## 2017-11-25 MED ORDER — FUROSEMIDE 20 MG PO TABS: 20.0000 mg | ORAL_TABLET | Freq: Every day | ORAL | 3 refills | Status: DC | PRN

## 2017-11-25 NOTE — Progress Notes (Addendum)
Call to daughter. Advised of Dr Kyla Balzarine recommendation to have labs to determine if needs Lasix PRN and should have an echo scheduled.   She reported the earliest she can get patient in for BMET and BNP is Friday after 3 pm and concerned it may be Monday before patient is able to take Lasix due getting lab results back late Friday.  Advised would send a message to Dr Kyla Balzarine nurse to discuss with him tomorrow morning and she will call with the recommendations and to schedule an echo. She said any information can be left on her voice mail because she will be teaching tomorrow.

## 2017-11-25 NOTE — Progress Notes (Signed)
Received: Today  Message Contents  Josue Hector, MD  Michaelyn Barter, RN  Cc: Sheran Fava Susanna Benge Panda, RN        Last echo was 2017 EF was normal with mild AS should update echo. Have him check BNP and BMET to verify Should have lasix 20 mg as needed for edema. He is usually SOB from lung issues. If BNP elevated , AS worse or EF down can consider daily lasix

## 2017-11-25 NOTE — Progress Notes (Signed)
Call to daughter and advised Dr Kyla Balzarine nurse will call her back 11/26/2017 with any recommendations.  Advised if patient has any urgent symptoms to use the ER if needed.  She verbalized understanding.

## 2017-11-25 NOTE — Telephone Encounter (Signed)
-----   Message from Rosalene Billings, RN sent at 11/25/2017  5:14 PM EDT ----- Regarding: RE: Fluid  I understood Dr Mariana Arn recommendation to say patient needs labs before ordering Lasix. I called the daughter and she cannot get the patient to the office for labs until Friday after 3 PM.  She was  concerned about the labs coming back too late Friday and it will be Monday before a decision is made about the Lasix.  I did not put orders in for Lasix, BMET or BNP.  Can you discuss with [name] in the morning and give her a call?  The daughter said to leave any information on her voice mail because she is will be teaching school tomorrow.    ----- Message ----- From: Josue Hector, MD Sent: 11/25/2017   4:52 PM EDT To: Michaelyn Barter, RN, Rosalene Billings, RN Subject: RE: Fluid                                      Last echo was 2017 EF was normal with mild AS should update echo. Have him check BNP and BMET to verify Should have lasix 20 mg as needed for edema. He is usually SOB from lung issues. If BNP elevated , AS worse or EF down can consider daily lasix   ----- Message ----- From: Michaelyn Barter, RN Sent: 11/25/2017   4:13 PM EDT To: Josue Hector, MD Subject: FW: Fluid                                      Did you review the CC chart from [facility] on patient?  ----- Message ----- From: Rosalene Billings, RN Sent: 11/25/2017   4:07 PM EDT To: Michaelyn Barter, RN Subject: Fluid                                          Hi Pam,  I wanted to check if [name] had any recommendations for patient.  I am off Thursday and Friday.  Do you know if he got a chance to review the note?   Let me know if you can before I leave so I can give the daughter a call back.    Thanks for your help, Margarita Grizzle

## 2017-11-25 NOTE — Telephone Encounter (Signed)
Called daughter about Dr. Kyla Balzarine advisement.  Per Dr. Johnsie Cancel, should have Lasix 20 mg as needed for edema. Patient will come in and get echo and lab work next week. Patient's daughter has a hard time getting patient to office without caregiver. The only time patient's caregiver is available and echo appt available at the same time is next Wednesday. Patient will get his lab work at that time. Sent Lasix 20 mg prn for edema to patient's pharmacy. Patient's daughter verbalized understanding. Per Dr. Johnsie Cancel can consider daily lasix after results are back from test.

## 2017-12-01 ENCOUNTER — Ambulatory Visit (INDEPENDENT_AMBULATORY_CARE_PROVIDER_SITE_OTHER): Payer: Medicare Other

## 2017-12-01 DIAGNOSIS — Z95810 Presence of automatic (implantable) cardiac defibrillator: Secondary | ICD-10-CM

## 2017-12-01 DIAGNOSIS — I5032 Chronic diastolic (congestive) heart failure: Secondary | ICD-10-CM

## 2017-12-01 NOTE — Progress Notes (Signed)
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  Heart Failure questions reviewed, pt asymptomatic.   Thoracic impedance returned to normal after taking prescribed PRN Furosemide.    Prescribed: Furosemide 20 mg Take 1 tablet (20 mg total) by mouth daily as needed for edema (prescribed 11/25/2017)  Labs: 11/06/2017 Creatinine 1.10, BUN 25, Potassium 3.8, Sodium 143, eGFR 57->60 10/07/2017 Creatinine1.09, BUN34, Potassium4.3, [id number], EGFR58->60 09/09/2017 Creatinine1.18, BUN34, Potassium4.0, [id number], EGFR52->60  06/15/2017 Creatinine1.30, BUN33, Potassium3.5, [id number], [id number]  06/01/2017 Creatinine1.29, BUN27, Potassium4.6, [id number], [id number]  04/27/2017 Creatinine1.14, BUN24, Potassium3.9, [id number], EGFR55->60 A complete set of results can be found in Results Review.  Recommendations: No changes.   Encouraged to call for fluid symptoms.  Follow-up plan: ICM clinic phone appointment on 01/04/2018.    Copy of ICM check sent to Dr. Rayann Heman and Johnsie Cancel.   3 month ICM trend: 12/01/2017    1 Year ICM trend:       Rosalene Billings, RN 12/01/2017 12:37 PM

## 2017-12-02 ENCOUNTER — Ambulatory Visit: Payer: Medicare Other | Admitting: Oncology

## 2017-12-02 ENCOUNTER — Ambulatory Visit (HOSPITAL_COMMUNITY): Payer: Medicare Other | Attending: Cardiology

## 2017-12-02 ENCOUNTER — Other Ambulatory Visit: Payer: Self-pay

## 2017-12-02 ENCOUNTER — Ambulatory Visit: Payer: Medicare Other

## 2017-12-02 ENCOUNTER — Other Ambulatory Visit: Payer: Medicare Other | Admitting: *Deleted

## 2017-12-02 ENCOUNTER — Other Ambulatory Visit: Payer: Medicare Other

## 2017-12-02 DIAGNOSIS — R0602 Shortness of breath: Secondary | ICD-10-CM | POA: Diagnosis not present

## 2017-12-02 DIAGNOSIS — R600 Localized edema: Secondary | ICD-10-CM | POA: Diagnosis not present

## 2017-12-03 ENCOUNTER — Telehealth: Payer: Self-pay | Admitting: *Deleted

## 2017-12-03 ENCOUNTER — Inpatient Hospital Stay: Payer: Medicare Other | Admitting: Medical

## 2017-12-03 ENCOUNTER — Other Ambulatory Visit: Payer: Self-pay | Admitting: *Deleted

## 2017-12-03 ENCOUNTER — Other Ambulatory Visit: Payer: Self-pay | Admitting: Emergency Medicine

## 2017-12-03 ENCOUNTER — Inpatient Hospital Stay: Payer: Medicare Other | Attending: Oncology | Admitting: Medical

## 2017-12-03 VITALS — BP 104/48 | HR 68 | Temp 99.8°F | Resp 18 | Ht 72.0 in

## 2017-12-03 DIAGNOSIS — D649 Anemia, unspecified: Secondary | ICD-10-CM

## 2017-12-03 DIAGNOSIS — D630 Anemia in neoplastic disease: Secondary | ICD-10-CM | POA: Diagnosis not present

## 2017-12-03 DIAGNOSIS — D631 Anemia in chronic kidney disease: Secondary | ICD-10-CM | POA: Diagnosis not present

## 2017-12-03 DIAGNOSIS — R0602 Shortness of breath: Secondary | ICD-10-CM

## 2017-12-03 DIAGNOSIS — N189 Chronic kidney disease, unspecified: Secondary | ICD-10-CM | POA: Diagnosis not present

## 2017-12-03 DIAGNOSIS — D61818 Other pancytopenia: Secondary | ICD-10-CM | POA: Insufficient documentation

## 2017-12-03 DIAGNOSIS — R300 Dysuria: Secondary | ICD-10-CM

## 2017-12-03 DIAGNOSIS — C9000 Multiple myeloma not having achieved remission: Secondary | ICD-10-CM

## 2017-12-03 DIAGNOSIS — N184 Chronic kidney disease, stage 4 (severe): Secondary | ICD-10-CM

## 2017-12-03 DIAGNOSIS — N179 Acute kidney failure, unspecified: Secondary | ICD-10-CM | POA: Insufficient documentation

## 2017-12-03 DIAGNOSIS — E538 Deficiency of other specified B group vitamins: Secondary | ICD-10-CM | POA: Insufficient documentation

## 2017-12-03 LAB — CBC WITH DIFFERENTIAL (CANCER CENTER ONLY)
Abs Immature Granulocytes: 0.02 10*3/uL (ref 0.00–0.07)
BASOS ABS: 0 10*3/uL (ref 0.0–0.1)
BASOS PCT: 0 %
EOS ABS: 0 10*3/uL (ref 0.0–0.5)
Eosinophils Relative: 0 %
HCT: 24.5 % — ABNORMAL LOW (ref 39.0–52.0)
Hemoglobin: 7.3 g/dL — ABNORMAL LOW (ref 13.0–17.0)
IMMATURE GRANULOCYTES: 1 %
LYMPHS ABS: 0.2 10*3/uL — AB (ref 0.7–4.0)
Lymphocytes Relative: 5 %
MCH: 22.7 pg — ABNORMAL LOW (ref 26.0–34.0)
MCHC: 29.8 g/dL — ABNORMAL LOW (ref 30.0–36.0)
MCV: 76.1 fL — ABNORMAL LOW (ref 80.0–100.0)
Monocytes Absolute: 0.2 10*3/uL (ref 0.1–1.0)
Monocytes Relative: 5 %
NEUTROS PCT: 89 %
NRBC: 0 % (ref 0.0–0.2)
Neutro Abs: 4 10*3/uL (ref 1.7–7.7)
PLATELETS: 95 10*3/uL — AB (ref 150–400)
RBC: 3.22 MIL/uL — ABNORMAL LOW (ref 4.22–5.81)
RDW: 22.1 % — AB (ref 11.5–15.5)
WBC Count: 4.4 10*3/uL (ref 4.0–10.5)

## 2017-12-03 LAB — URINALYSIS, COMPLETE (UACMP) WITH MICROSCOPIC
BILIRUBIN URINE: NEGATIVE
GLUCOSE, UA: NEGATIVE mg/dL
KETONES UR: NEGATIVE mg/dL
LEUKOCYTES UA: NEGATIVE
NITRITE: NEGATIVE
Protein, ur: NEGATIVE mg/dL
SPECIFIC GRAVITY, URINE: 1.016 (ref 1.005–1.030)
pH: 5 (ref 5.0–8.0)

## 2017-12-03 LAB — PREPARE RBC (CROSSMATCH)

## 2017-12-03 LAB — BASIC METABOLIC PANEL
BUN/Creatinine Ratio: 29 — ABNORMAL HIGH (ref 10–24)
BUN: 30 mg/dL (ref 10–36)
CALCIUM: 8.4 mg/dL — AB (ref 8.6–10.2)
CO2: 26 mmol/L (ref 20–29)
Chloride: 105 mmol/L (ref 96–106)
Creatinine, Ser: 1.02 mg/dL (ref 0.76–1.27)
GFR calc Af Amer: 74 mL/min/{1.73_m2} (ref >59)
GFR, EST NON AFRICAN AMERICAN: 64 mL/min/{1.73_m2} (ref >59)
Glucose: 87 mg/dL (ref 65–99)
POTASSIUM: 4.4 mmol/L (ref 3.5–5.2)
Sodium: 146 mmol/L — ABNORMAL HIGH (ref 134–144)

## 2017-12-03 LAB — PRO B NATRIURETIC PEPTIDE: NT-Pro BNP: 1328 pg/mL — ABNORMAL HIGH (ref 0–486)

## 2017-12-03 LAB — SAMPLE TO BLOOD BANK

## 2017-12-03 MED ORDER — ACETAMINOPHEN 325 MG PO TABS
650.0000 mg | ORAL_TABLET | Freq: Once | ORAL | Status: AC
  Administered 2017-12-03: 650 mg via ORAL

## 2017-12-03 MED ORDER — ACETAMINOPHEN 325 MG PO TABS
ORAL_TABLET | ORAL | Status: AC
  Filled 2017-12-03: qty 2

## 2017-12-03 MED ORDER — SODIUM CHLORIDE 0.9% IV SOLUTION
250.0000 mL | Freq: Once | INTRAVENOUS | Status: AC
  Administered 2017-12-03: 250 mL via INTRAVENOUS
  Filled 2017-12-03: qty 250

## 2017-12-03 MED ORDER — EPOETIN ALFA 20000 UNIT/ML IJ SOLN
INTRAMUSCULAR | Status: AC
  Filled 2017-12-03: qty 1

## 2017-12-03 MED ORDER — SULFAMETHOXAZOLE-TRIMETHOPRIM 800-160 MG PO TABS: 1.0000 | ORAL_TABLET | Freq: Two times a day (BID) | ORAL | 0 refills | Status: DC

## 2017-12-03 MED ORDER — EPOETIN ALFA 20000 UNIT/ML IJ SOLN
60000.0000 [IU] | Freq: Once | INTRAMUSCULAR | Status: AC
  Administered 2017-12-03: 60000 [IU] via SUBCUTANEOUS

## 2017-12-03 MED ORDER — EPOETIN ALFA 40000 UNIT/ML IJ SOLN
INTRAMUSCULAR | Status: AC
  Filled 2017-12-03: qty 1

## 2017-12-03 NOTE — Progress Notes (Signed)
These results were called to [name] [name]' daughter . Her questions were answered. She expressed understanding.

## 2017-12-03 NOTE — Telephone Encounter (Signed)
Received TC from pt's daughter this am. 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 Pt has COPD and Multiple myeloma and has a history of getting SOB when HGB gets low.  High priority scheduling message sent for labs and [facility] appt. For this am.

## 2017-12-03 NOTE — Patient Instructions (Signed)

## 2017-12-04 ENCOUNTER — Other Ambulatory Visit: Payer: Self-pay | Admitting: Medical

## 2017-12-04 ENCOUNTER — Inpatient Hospital Stay: Payer: Medicare Other

## 2017-12-04 DIAGNOSIS — D631 Anemia in chronic kidney disease: Secondary | ICD-10-CM | POA: Diagnosis not present

## 2017-12-04 DIAGNOSIS — D630 Anemia in neoplastic disease: Secondary | ICD-10-CM | POA: Diagnosis not present

## 2017-12-04 DIAGNOSIS — C9000 Multiple myeloma not having achieved remission: Secondary | ICD-10-CM | POA: Diagnosis not present

## 2017-12-04 DIAGNOSIS — R300 Dysuria: Secondary | ICD-10-CM | POA: Diagnosis not present

## 2017-12-04 DIAGNOSIS — D649 Anemia, unspecified: Secondary | ICD-10-CM

## 2017-12-04 DIAGNOSIS — N189 Chronic kidney disease, unspecified: Secondary | ICD-10-CM | POA: Diagnosis not present

## 2017-12-04 DIAGNOSIS — N179 Acute kidney failure, unspecified: Secondary | ICD-10-CM | POA: Diagnosis not present

## 2017-12-04 MED ORDER — HEPARIN SOD (PORK) LOCK FLUSH 100 UNIT/ML IV SOLN
500.0000 [IU] | Freq: Every day | INTRAVENOUS | Status: DC | PRN
  Filled 2017-12-04: qty 5

## 2017-12-04 MED ORDER — AMOXICILLIN 875 MG PO TABS: 875.0000 mg | ORAL_TABLET | Freq: Two times a day (BID) | ORAL | 0 refills | Status: DC

## 2017-12-04 MED ORDER — ACETAMINOPHEN 325 MG PO TABS
ORAL_TABLET | ORAL | Status: AC
  Filled 2017-12-04: qty 2

## 2017-12-04 MED ORDER — DIPHENHYDRAMINE HCL 25 MG PO CAPS
ORAL_CAPSULE | ORAL | Status: AC
  Filled 2017-12-04: qty 1

## 2017-12-04 MED ORDER — SODIUM CHLORIDE 0.9% FLUSH
10.0000 mL | INTRAVENOUS | Status: DC | PRN
  Filled 2017-12-04: qty 10

## 2017-12-04 MED ORDER — SODIUM CHLORIDE 0.9% IV SOLUTION
250.0000 mL | Freq: Once | INTRAVENOUS | Status: AC
  Administered 2017-12-04: 250 mL via INTRAVENOUS
  Filled 2017-12-04: qty 250

## 2017-12-04 MED ORDER — ACETAMINOPHEN 325 MG PO TABS
650.0000 mg | ORAL_TABLET | Freq: Once | ORAL | Status: AC
  Administered 2017-12-04: 650 mg via ORAL

## 2017-12-04 NOTE — Patient Instructions (Signed)

## 2017-12-04 NOTE — Progress Notes (Signed)
PIV removed at [id number] today from left wrist.

## 2017-12-05 LAB — TYPE AND SCREEN
ABO/RH(D): A POS
Antibody Screen: NEGATIVE
UNIT DIVISION: 0
UNIT DIVISION: 0

## 2017-12-05 LAB — BPAM RBC
BLOOD PRODUCT EXPIRATION DATE: 201912042359
Blood Product Expiration Date: 201911302359
ISSUE DATE / TIME: 201911081532
ISSUE DATE / TIME: 201911071255
UNIT TYPE AND RH: 6200
Unit Type and Rh: 6200

## 2017-12-05 LAB — URINE CULTURE: Culture: 60000 — AB

## 2017-12-08 NOTE — Progress Notes (Signed)
 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 Gary B. Sherrill  Actively treated with chemotherapy/immunotherapy: no   Assessment: Plan:    Symptomatic anemia - Plan: Practitioner attestation of consent, Complete patient signature process for consent form, Care order/instruction, 0.9 %  sodium chloride infusion (Manually program via Guardrails IV Fluids), Type and screen, Prepare RBC, Transfuse RBC, acetaminophen (TYLENOL) tablet 650 mg, epoetin alfa (EPOGEN,PROCRIT) injection 60,000 Units, Prepare RBC, Type and screen, BPAM RBC, Transfuse RBC, Type and screen, DISCONTINUED: heparin lock flush 100 unit/mL, DISCONTINUED: 0.9 %  sodium chloride infusion (Manually program via Guardrails IV Fluids), DISCONTINUED: acetaminophen (TYLENOL) tablet 650 mg, CANCELED: Practitioner attestation of consent, CANCELED: Complete patient signature process for consent form, CANCELED: Care order/instruction, CANCELED: Prepare RBC, CANCELED: Transfuse RBC  Multiple myeloma not having achieved remission (HCC)  Acute renal failure superimposed on stage 4 chronic kidney disease, unspecified acute renal failure type (HCC) - Plan: epoetin alfa (EPOGEN,PROCRIT) injection 60,000 Units  Dysuria - Plan: Urinalysis, Complete w Microscopic, Urine Culture, DISCONTINUED: sulfamethoxazole-trimethoprim (BACTRIM DS,SEPTRA DS) 800-160 MG tablet   Symptomatic anemia in the setting of 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  Based on this he will receive 1 unit of packed red blood cells today along with erythropoietin 60,000 units.  He will return tomorrow for 1 additional unit of packed red blood cells.  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 Gary B. Sherrill.  He  will see Dr. Sherrill in follow-up on 12/10/2017.  Dysuria: A urine culture and urinalysis were collected today.  Please see After Visit Summary for patient specific instructions.  Future Appointments  Date Time Provider Department Center  12/10/2017  3:30 PM [facility] LAB 1 [facility] None  12/10/2017  4:00 PM Sherrill, Gary B, MD [facility] None  12/10/2017  4:30 PM [facility] INFUSION [facility] None  01/04/2018  3:55 PM [facility] DEVICE REMOTES [facility]  02/22/2018  7:10 AM [facility] DEVICE REMOTES [facility]    Orders Placed This Encounter  Procedures  . Urine Culture  . Urinalysis, Complete w Microscopic  . Practitioner attestation of consent  . Complete patient signature process for consent form  . Care order/instruction  . Type and screen  . Prepare RBC  . 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 (DOB 03/10/1925) male.  Chief Complaint: No chief complaint on file.   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  He is followed by Dr. Gary B. Sherrill.  He was placed on Lasix last week.  He was seen by cardiology and had an echocardiogram and labs completed on 12/02/2017.  He has not taken his Lasix today.  His energy level is low.  He presents to the clinic with his daughter today.  A CBC returned today with a hemoglobin of 7.3 and a hematocrit of 24.5.  He has not had erythropoietin for at least 2 weeks.  He is having swelling in his bilateral lower extremities.  Medications: I have reviewed the patient's current medications.  Allergies: No Known Allergies  Past Medical History:  Diagnosis Date  . Anemia, unspecified   .   CAD (coronary artery disease)   . Cataracts, bilateral   . CHF (congestive heart failure) (HCC)   . Chronic airway obstruction, not elsewhere  classified    on o2 at night  . Diabetes mellitus   . Diverticulosis of colon (without mention of hemorrhage)   . Dyslipidemia   . Emphysema   . Hemorrhoids   . ICD (implantable cardiac defibrillator) in place    st jude  . Ischemic cardiomyopathy   . Macular degeneration   . MI, old 2010 or 2011  . Prostatic hypertrophy    hx of  . Retention of urine, unspecified   . Unspecified disorder resulting from impaired renal function     Past Surgical History:  Procedure Laterality Date  . CARDIAC DEFIBRILLATOR PLACEMENT  2011   st jude  . CATARACT EXTRACTION    . PACEMAKER INSERTION  2011  . pci     4/11  . TONSILLECTOMY  84 yo    Family History  Problem Relation Age of Onset  . Coronary artery disease Mother   . Stroke Mother   . Congestive Heart Failure Mother   . Lung cancer Father        smoker  . Breast cancer Sister   . Atrial fibrillation Daughter   . Heart attack Brother     Social History   Socioeconomic History  . Marital status: Widowed    Spouse name: Not on file  . Number of children: Not on file  . Years of education: Not on file  . Highest education level: Not on file  Occupational History  . Occupation: dentist    Employer: [employer]  Social Needs  . Financial resource strain: Not on file  . Food insecurity:    Worry: Not on file    Inability: Not on file  . Transportation needs:    Medical: Not on file    Non-medical: Not on file  Tobacco Use  . Smoking status: Former Smoker    Types: Cigarettes    Last attempt to quit: 04/27/2009    Years since quitting: 8.6  . Smokeless tobacco: Never Used  Substance and Sexual Activity  . Alcohol use: Yes    Comment: occasional beer  . Drug use: No  . Sexual activity: Never  Lifestyle  . Physical activity:    Days per week: Not on file    Minutes per session: Not on file  . Stress: Not on file  Relationships  . Social connections:    Talks on phone: Not on file    Gets together: Not on  file    Attends religious service: Not on file    Active member of club or organization: Not on file    Attends meetings of clubs or organizations: Not on file    Relationship status: Not on file  . Intimate partner violence:    Fear of current or ex partner: Not on file    Emotionally abused: Not on file    Physically abused: Not on file    Forced sexual activity: Not on file  Other Topics Concern  . Not on file  Social History Narrative   Lives in a house with stairs, which he needs to use, with his wife.  Wife is not able-bodied.  Does not use a cane or walker, but is unsteady on his feet.        [name] Tarte:  [phone] cell, [phone], daughter, POA.     Bill Vaughan:    [phone], nephew   Juluis Pitch:  [phone], daughter          Past Medical History, Surgical history, Social history, and Family history were reviewed and updated as appropriate.   Please see review of systems for further details on the patient's review from today.   Review of Systems:  Review of Systems  Constitutional: Positive for fatigue. Negative for appetite change, chills, diaphoresis and fever.  HENT: Negative for dental problem, mouth sores and trouble swallowing.   Respiratory: Negative for cough, chest tightness and shortness of breath.   Cardiovascular: Positive for leg swelling. Negative for chest pain and palpitations.  Gastrointestinal: Negative for constipation, diarrhea, nausea and vomiting.  Neurological: Positive for weakness. Negative for dizziness, syncope and headaches.    Objective:   Physical Exam:  BP (!) 104/48   Pulse 68   Temp 99.8 F (37.7 C) (Oral)   Resp 18   Ht 6' (1.829 m)   SpO2 100%   BMI 20.40 kg/m  ECOG: 1  Physical Exam  Constitutional: No distress.  HENT:  Head: Normocephalic and atraumatic.  Eyes: Right eye exhibits no discharge. Left eye exhibits no discharge. No scleral icterus.  Cardiovascular: Normal rate, regular rhythm and normal  heart sounds. Exam reveals no gallop and no friction rub.  No murmur heard. Pulmonary/Chest: Effort normal and breath sounds normal. No respiratory distress. He has no wheezes. He has no rales.  Neurological: He is alert. Coordination (The patient is ambulating with the use of an electric scooter) abnormal.  Skin: Skin is warm and dry. No rash noted. He is not diaphoretic. No erythema.    Lab Review:     Component Value Date/Time   NA 146 (H) 12/02/2017 1553   NA 140 01/12/2017 1538   K 4.4 12/02/2017 1553   K 4.4 01/12/2017 1538   CL 105 12/02/2017 1553   CO2 26 12/02/2017 1553   CO2 27 01/12/2017 1538   GLUCOSE 87 12/02/2017 1553   GLUCOSE 147 (H) 11/06/2017 1110   GLUCOSE 84 01/12/2017 1538   BUN 30 12/02/2017 1553   BUN 23.7 01/12/2017 1538   CREATININE 1.02 12/02/2017 1553   CREATININE 1.10 11/06/2017 1110   CREATININE 1.0 01/12/2017 1538   CALCIUM 8.4 (L) 12/02/2017 1553   CALCIUM 8.7 01/12/2017 1538   PROT 5.5 (L) 06/15/2017 1541   PROT 5.7 (L) 01/12/2017 1538   PROT 6.1 (L) 01/12/2017 1538   ALBUMIN 3.1 (L) 06/15/2017 1541   ALBUMIN 2.9 (L) 01/12/2017 1538   AST 9 06/15/2017 1541   AST 11 01/12/2017 1538   ALT 8 06/15/2017 1541   ALT 12 01/12/2017 1538   ALKPHOS 53 06/15/2017 1541   ALKPHOS 49 01/12/2017 1538   BILITOT 0.4 06/15/2017 1541   BILITOT 0.47 01/12/2017 1538   GFRNONAA 64 12/02/2017 1553   GFRNONAA 57 (L) 11/06/2017 1110   GFRAA 74 12/02/2017 1553   GFRAA >60 11/06/2017 1110       Component Value Date/Time   WBC 4.4 12/03/2017 1043   WBC 4.2 07/06/2017 1505   RBC 3.22 (L) 12/03/2017 1043   HGB 7.3 (L) 12/03/2017 1043   HGB 7.4 (L) 01/12/2017 1538   HCT 24.5 (L) 12/03/2017 1043   HCT 23.8 (L) 01/12/2017 1538   PLT 95 (L) 12/03/2017 1043   PLT 89 (L) 01/12/2017 1538   MCV 76.1 (L) 12/03/2017 1043   MCV 74.3 (L) 01/12/2017 1538   MCH 22.7 (L) 12/03/2017 1043   MCHC 29.8 (L) 12/03/2017  1043   RDW 22.1 (H) 12/03/2017 1043   RDW 21.0 (H)  01/12/2017 1538   LYMPHSABS 0.2 (L) 12/03/2017 1043   LYMPHSABS 0.6 (L) 01/12/2017 1538   MONOABS 0.2 12/03/2017 1043   MONOABS 0.3 01/12/2017 1538   EOSABS 0.0 12/03/2017 1043   EOSABS 0.1 01/12/2017 1538   BASOSABS 0.0 12/03/2017 1043   BASOSABS 0.1 01/12/2017 1538   -------------------------------  Imaging from last 24 hours (if applicable):  Radiology interpretation: No results found.      This case was discussed with Dr. [name]. He expressed agreement with my management of this patient.

## 2017-12-09 ENCOUNTER — Other Ambulatory Visit: Payer: Self-pay | Admitting: Emergency Medicine

## 2017-12-09 DIAGNOSIS — D649 Anemia, unspecified: Secondary | ICD-10-CM

## 2017-12-10 ENCOUNTER — Inpatient Hospital Stay: Payer: Medicare Other

## 2017-12-10 ENCOUNTER — Telehealth: Payer: Self-pay | Admitting: Oncology

## 2017-12-10 ENCOUNTER — Inpatient Hospital Stay (HOSPITAL_BASED_OUTPATIENT_CLINIC_OR_DEPARTMENT_OTHER): Payer: Medicare Other | Admitting: Oncology

## 2017-12-10 VITALS — BP 150/75 | HR 66 | Temp 97.3°F | Resp 18 | Ht 72.0 in | Wt 151.7 lb

## 2017-12-10 VITALS — BP 150/75

## 2017-12-10 DIAGNOSIS — D630 Anemia in neoplastic disease: Secondary | ICD-10-CM | POA: Diagnosis not present

## 2017-12-10 DIAGNOSIS — N179 Acute kidney failure, unspecified: Secondary | ICD-10-CM | POA: Diagnosis not present

## 2017-12-10 DIAGNOSIS — D61818 Other pancytopenia: Secondary | ICD-10-CM | POA: Diagnosis not present

## 2017-12-10 DIAGNOSIS — E538 Deficiency of other specified B group vitamins: Secondary | ICD-10-CM | POA: Diagnosis not present

## 2017-12-10 DIAGNOSIS — R300 Dysuria: Secondary | ICD-10-CM | POA: Diagnosis not present

## 2017-12-10 DIAGNOSIS — N189 Chronic kidney disease, unspecified: Secondary | ICD-10-CM

## 2017-12-10 DIAGNOSIS — C9000 Multiple myeloma not having achieved remission: Secondary | ICD-10-CM

## 2017-12-10 DIAGNOSIS — D649 Anemia, unspecified: Secondary | ICD-10-CM

## 2017-12-10 DIAGNOSIS — D631 Anemia in chronic kidney disease: Secondary | ICD-10-CM | POA: Diagnosis not present

## 2017-12-10 LAB — CBC WITH DIFFERENTIAL (CANCER CENTER ONLY)
ABS IMMATURE GRANULOCYTES: 0.02 10*3/uL (ref 0.00–0.07)
BASOS ABS: 0 10*3/uL (ref 0.0–0.1)
BASOS PCT: 0 %
EOS PCT: 2 %
Eosinophils Absolute: 0.1 10*3/uL (ref 0.0–0.5)
HCT: 34.8 % — ABNORMAL LOW (ref 39.0–52.0)
HEMOGLOBIN: 10.6 g/dL — AB (ref 13.0–17.0)
Immature Granulocytes: 1 %
LYMPHS PCT: 28 %
Lymphs Abs: 0.9 10*3/uL (ref 0.7–4.0)
MCH: 23.3 pg — AB (ref 26.0–34.0)
MCHC: 30.5 g/dL (ref 30.0–36.0)
MCV: 76.5 fL — ABNORMAL LOW (ref 80.0–100.0)
Monocytes Absolute: 0.2 10*3/uL (ref 0.1–1.0)
Monocytes Relative: 5 %
NEUTROS ABS: 2.1 10*3/uL (ref 1.7–7.7)
NRBC: 0.9 % — AB (ref 0.0–0.2)
Neutrophils Relative %: 64 %
PLATELETS: 123 10*3/uL — AB (ref 150–400)
RBC: 4.55 MIL/uL (ref 4.22–5.81)
RDW: 21.4 % — ABNORMAL HIGH (ref 11.5–15.5)
WBC: 3.3 10*3/uL — AB (ref 4.0–10.5)

## 2017-12-10 LAB — SAMPLE TO BLOOD BANK

## 2017-12-10 MED ORDER — EPOETIN ALFA 20000 UNIT/ML IJ SOLN
INTRAMUSCULAR | Status: AC
  Filled 2017-12-10: qty 1

## 2017-12-10 MED ORDER — EPOETIN ALFA 40000 UNIT/ML IJ SOLN
INTRAMUSCULAR | Status: AC
  Filled 2017-12-10: qty 1

## 2017-12-10 MED ORDER — EPOETIN ALFA 20000 UNIT/ML IJ SOLN: 60000.0000 [IU] | Freq: Once | INTRAMUSCULAR | Status: DC

## 2017-12-10 NOTE — Patient Instructions (Signed)
Epoetin Alfa injection What is this medicine? EPOETIN ALFA (e POE e tin AL fa) helps your body make more red blood cells. This medicine is used to treat anemia caused by chronic kidney failure, cancer chemotherapy, or HIV-therapy. It may also be used before surgery if you have anemia. This medicine may be used for other purposes; ask your health care provider or pharmacist if you have questions. COMMON BRAND NAME(S): Epogen, Procrit What should I tell my health care provider before I take this medicine? They need to know if you have any of these conditions: -blood clotting disorders -cancer patient not on chemotherapy -cystic fibrosis -heart disease, such as angina or heart failure -hemoglobin level of 12 g/dL or greater -high blood pressure -low levels of folate, iron, or vitamin B12 -seizures -an unusual or allergic reaction to erythropoietin, albumin, benzyl alcohol, hamster proteins, other medicines, foods, dyes, or preservatives -pregnant or trying to get pregnant -breast-feeding How should I use this medicine? This medicine is for injection into a vein or under the skin. It is usually given by a health care professional in a hospital or clinic setting. If you get this medicine at home, you will be taught how to prepare and give this medicine. Use exactly as directed. Take your medicine at regular intervals. Do not take your medicine more often than directed. It is important that you put your used needles and syringes in a special sharps container. Do not put them in a trash can. If you do not have a sharps container, call your pharmacist or healthcare provider to get one. A special MedGuide will be given to you by the pharmacist with each prescription and refill. Be sure to read this information carefully each time. Talk to your pediatrician regarding the use of this medicine in children. While this drug may be prescribed for selected conditions, precautions do apply. Overdosage: If you  think you have taken too much of this medicine contact a poison control center or emergency room at once. NOTE: This medicine is only for you. Do not share this medicine with others. What if I miss a dose? If you miss a dose, take it as soon as you can. If it is almost time for your next dose, take only that dose. Do not take double or extra doses. What may interact with this medicine? Do not take this medicine with any of the following medications: -darbepoetin alfa This list may not describe all possible interactions. Give your health care provider a list of all the medicines, herbs, non-prescription drugs, or dietary supplements you use. Also tell them if you smoke, drink alcohol, or use illegal drugs. Some items may interact with your medicine. What should I watch for while using this medicine? Your condition will be monitored carefully while you are receiving this medicine. You may need blood work done while you are taking this medicine. What side effects may I notice from receiving this medicine? Side effects that you should report to your doctor or health care professional as soon as possible: -allergic reactions like skin rash, itching or hives, swelling of the face, lips, or tongue -breathing problems -changes in vision -chest pain -confusion, trouble speaking or understanding -feeling faint or lightheaded, falls -high blood pressure -muscle aches or pains -pain, swelling, warmth in the leg -rapid weight gain -severe headaches -sudden numbness or weakness of the face, arm or leg -trouble walking, dizziness, loss of balance or coordination -seizures (convulsions) -swelling of the ankles, feet, hands -unusually weak or tired   Side effects that usually do not require medical attention (report to your doctor or health care professional if they continue or are bothersome): -diarrhea -fever, chills (flu-like symptoms) -headaches -nausea, vomiting -redness, stinging, or swelling at  site where injected This list may not describe all possible side effects. Call your doctor for medical advice about side effects. You may report side effects to FDA at 1-800-FDA-1088. Where should I keep my medicine? Keep out of the reach of children. Store in a refrigerator between 2 and 8 degrees C (36 and 46 degrees F). Do not freeze or shake. Throw away any unused portion if using a single-dose vial. Multi-dose vials can be kept in the refrigerator for up to 21 days after the initial dose. Throw away unused medicine. NOTE: This sheet is a summary. It may not cover all possible information. If you have questions about this medicine, talk to your doctor, pharmacist, or health care provider.  2018 Elsevier/Gold Standard (2015-09-03 19:42:31) Cyanocobalamin, Vitamin B12 injection What is this medicine? CYANOCOBALAMIN (sye an oh koe BAL a min) is a man made form of vitamin B12. Vitamin B12 is used in the growth of healthy blood cells, nerve cells, and proteins in the body. It also helps with the metabolism of fats and carbohydrates. This medicine is used to treat people who can not absorb vitamin B12. This medicine may be used for other purposes; ask your health care provider or pharmacist if you have questions. COMMON BRAND NAME(S): B-12 Compliance Kit, B-12 Injection Kit, Cyomin, LA-12, Nutri-Twelve, Physicians EZ Use B-12, Primabalt What should I tell my health care provider before I take this medicine? They need to know if you have any of these conditions: -kidney disease -Leber's disease -megaloblastic anemia -an unusual or allergic reaction to cyanocobalamin, cobalt, other medicines, foods, dyes, or preservatives -pregnant or trying to get pregnant -breast-feeding How should I use this medicine? This medicine is injected into a muscle or deeply under the skin. It is usually given by a health care professional in a clinic or doctor's office. However, your doctor may teach you how to inject  yourself. Follow all instructions. Talk to your pediatrician regarding the use of this medicine in children. Special care may be needed. Overdosage: If you think you have taken too much of this medicine contact a poison control center or emergency room at once. NOTE: This medicine is only for you. Do not share this medicine with others. What if I miss a dose? If you are given your dose at a clinic or doctor's office, call to reschedule your appointment. If you give your own injections and you miss a dose, take it as soon as you can. If it is almost time for your next dose, take only that dose. Do not take double or extra doses. What may interact with this medicine? -colchicine -heavy alcohol intake This list may not describe all possible interactions. Give your health care provider a list of all the medicines, herbs, non-prescription drugs, or dietary supplements you use. Also tell them if you smoke, drink alcohol, or use illegal drugs. Some items may interact with your medicine. What should I watch for while using this medicine? Visit your doctor or health care professional regularly. You may need blood work done while you are taking this medicine. You may need to follow a special diet. Talk to your doctor. Limit your alcohol intake and avoid smoking to get the best benefit. What side effects may I notice from receiving this medicine? Side effects   that you should report to your doctor or health care professional as soon as possible: -allergic reactions like skin rash, itching or hives, swelling of the face, lips, or tongue -blue tint to skin -chest tightness, pain -difficulty breathing, wheezing -dizziness -red, swollen painful area on the leg Side effects that usually do not require medical attention (report to your doctor or health care professional if they continue or are bothersome): -diarrhea -headache This list may not describe all possible side effects. Call your doctor for medical  advice about side effects. You may report side effects to FDA at 1-800-FDA-1088. Where should I keep my medicine? Keep out of the reach of children. Store at room temperature between 15 and 30 degrees C (59 and 85 degrees F). Protect from light. Throw away any unused medicine after the expiration date. NOTE: This sheet is a summary. It may not cover all possible information. If you have questions about this medicine, talk to your doctor, pharmacist, or health care provider.  2018 Elsevier/Gold Standard (2007-04-26 22:10:20)  

## 2017-12-10 NOTE — Progress Notes (Signed)
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  [name] developed symptomatic anemia last week and was transfused with packed red blood [name].  [name] felt better after the transfusion.  [name] reported dysuria.  A urinalysis did not reveal white [name], [name] a urine culture grew enterococcus.  [name] is completing a course of amoxicillin. [name] continues to have urinary frequency. Objective:  Vital signs in last 24 hours:  Blood pressure (!) 150/75, pulse 66, temperature (!) 97.3 F (36.3 C), temperature source Oral, resp. rate 18, height 6' (1.829 m), weight 151 lb 11.2 oz (68.8 kg), SpO2 96 %.    HEENT: No thrush Resp: Distant breath sounds with rhonchi at the left posterior base, no respiratory distress Cardio: Regular rhythm, distant heart sounds GI: No hepatosplenomegaly Vascular: Trace edema at the right greater than left lower leg  Lab Results:  Lab Results  Component Value Date   WBC 3.3 (L) 12/10/2017   HGB 10.6 (L) 12/10/2017   HCT 34.8 (L) 12/10/2017   MCV 76.5 (L) 12/10/2017   PLT 123 (L) 12/10/2017   NEUTROABS 2.1 12/10/2017    CMP  Lab Results  Component Value Date   NA 146 (H) 12/02/2017   K 4.4 12/02/2017   CL 105 12/02/2017   CO2 26 12/02/2017   GLUCOSE 87 12/02/2017   BUN 30 12/02/2017   CREATININE 1.02 12/02/2017   CALCIUM 8.4 (L) 12/02/2017   PROT 5.5 (L) 06/15/2017   ALBUMIN 3.1 (L) 06/15/2017   AST 9 06/15/2017   ALT 8 06/15/2017   ALKPHOS 53 06/15/2017   BILITOT 0.4 06/15/2017   GFRNONAA 64 12/02/2017   GFRAA 74 12/02/2017     Medications: I have reviewed the patient's current medications.   Assessment/Plan: 1. Pancytopenia-transfused with packed red blood [name] 11/28/2016, 12/27/2016, 01/13/2017, 02/02/2017 2. History of beta thalassemia trait 3. History of vitamin B-12 deficiency-vitamin B-12 replacement initiated 11/11/2016 4. History of a serum monoclonal  IgAkappaprotein-elevated serum free kappa light chains, IgA, and M spike10/16/2018  Bone marrow biopsy 12/03/2016-consistent with multiple myeloma  Bone survey 12/06/2016-no destructive or lytic bone lesions identified.  Revlimid 21 days on/7 days off and weekly dexamethasone 12/16/2016(Decadron discontinued 12/29/2016 secondary to altered mental status)  Cycle 2 single agent Revlimid 01/14/2017(discontinued 3 days early)  Serum light chains, IgAimproved 02/02/2017  Cycle3 single agent Revlimid beginning 02/16/2017  Cycle4single agent Revlimid beginning 03/16/2017  Cycle 5 single agent Revlimid beginning 04/14/2017(Revlimid placed on hold beginning 04/28/2017 due to thrombocytopenia)  Cycle 6 single agent Revlimid beginning 05/23/2017 x 14 days 5. COPD with acute and chronic respiratory failure/hypoxemia 6. History of coronary artery disease 7. Ischemic cardiomyopathy 8. ICD in place 9. Prostatic hypertrophy 10.Inappropriately low erythropoietin level 11/28/2016 11. Severe pancytopenia secondary to multiple myeloma and Revlimid-improved 12. Fever-likely related to an upper respiratory infection, RSV positive swab 02/02/2017 13.Anemia secondary to renal insufficiency-trial of weekly erythropoietin initiated 06/17/2017.Erythropoietin dose increased to 60,000 units weekly beginning 07/28/2017.  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  [name] has been maintained off of specific therapy for myeloma since April 2019.  [name] has continued erythropoietin and requires infrequent transfusion support. The myeloma markers are increasing.  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  [name] is reluctant to consider any treatment that could worsen his pre-existing neuropathy.  The family is concerned about mental status change while on  steroids.  We will repeat myeloma parameters when [name] returns for erythropoietin  next week.  The plan is to change back to an every week erythropoietin schedule.  [name] will return for office and lab visit on 12/30/2017.  We will consider beginning treatment with melphalan/prednisone or Cytoxan/prednisone.  25 minutes were spent with the patient today.  The majority of the time was used for counseling and coordination of care.  [name], MD  12/10/2017  4:31 PM

## 2017-12-10 NOTE — Telephone Encounter (Signed)
Gave pt avs and calendar  °

## 2017-12-16 ENCOUNTER — Inpatient Hospital Stay: Payer: Medicare Other

## 2017-12-16 VITALS — BP 144/70 | HR 66 | Temp 98.1°F | Resp 18

## 2017-12-16 DIAGNOSIS — D631 Anemia in chronic kidney disease: Secondary | ICD-10-CM | POA: Diagnosis not present

## 2017-12-16 DIAGNOSIS — E538 Deficiency of other specified B group vitamins: Secondary | ICD-10-CM

## 2017-12-16 DIAGNOSIS — N179 Acute kidney failure, unspecified: Secondary | ICD-10-CM | POA: Diagnosis not present

## 2017-12-16 DIAGNOSIS — C9000 Multiple myeloma not having achieved remission: Secondary | ICD-10-CM

## 2017-12-16 DIAGNOSIS — N189 Chronic kidney disease, unspecified: Secondary | ICD-10-CM | POA: Diagnosis not present

## 2017-12-16 DIAGNOSIS — D630 Anemia in neoplastic disease: Secondary | ICD-10-CM | POA: Diagnosis not present

## 2017-12-16 DIAGNOSIS — R300 Dysuria: Secondary | ICD-10-CM | POA: Diagnosis not present

## 2017-12-16 LAB — CMP (CANCER CENTER ONLY)
ALK PHOS: 50 U/L (ref 38–126)
ALT: 8 U/L (ref 0–44)
AST: 12 U/L — ABNORMAL LOW (ref 15–41)
Albumin: 3.4 g/dL — ABNORMAL LOW (ref 3.5–5.0)
Anion gap: 9 (ref 5–15)
BILIRUBIN TOTAL: 0.5 mg/dL (ref 0.3–1.2)
BUN: 34 mg/dL — ABNORMAL HIGH (ref 8–23)
CALCIUM: 8.7 mg/dL — AB (ref 8.9–10.3)
CO2: 28 mmol/L (ref 22–32)
CREATININE: 1.12 mg/dL (ref 0.61–1.24)
Chloride: 107 mmol/L (ref 98–111)
GFR, Estimated: 56 mL/min — ABNORMAL LOW (ref >60)
GLUCOSE: 92 mg/dL (ref 70–99)
Potassium: 4.3 mmol/L (ref 3.5–5.1)
SODIUM: 144 mmol/L (ref 135–145)
Total Protein: 6.3 g/dL — ABNORMAL LOW (ref 6.5–8.1)

## 2017-12-16 LAB — CBC WITH DIFFERENTIAL (CANCER CENTER ONLY)
ABS IMMATURE GRANULOCYTES: 0.03 10*3/uL (ref 0.00–0.07)
BASOS PCT: 0 %
Basophils Absolute: 0 10*3/uL (ref 0.0–0.1)
Eosinophils Absolute: 0.1 10*3/uL (ref 0.0–0.5)
Eosinophils Relative: 3 %
HCT: 30.7 % — ABNORMAL LOW (ref 39.0–52.0)
Hemoglobin: 9.4 g/dL — ABNORMAL LOW (ref 13.0–17.0)
Immature Granulocytes: 1 %
Lymphocytes Relative: 25 %
Lymphs Abs: 1 10*3/uL (ref 0.7–4.0)
MCH: 23.1 pg — AB (ref 26.0–34.0)
MCHC: 30.6 g/dL (ref 30.0–36.0)
MCV: 75.4 fL — ABNORMAL LOW (ref 80.0–100.0)
MONO ABS: 0.2 10*3/uL (ref 0.1–1.0)
Monocytes Relative: 5 %
NEUTROS ABS: 2.7 10*3/uL (ref 1.7–7.7)
NEUTROS PCT: 66 %
NRBC: 0 % (ref 0.0–0.2)
Platelet Count: 105 10*3/uL — ABNORMAL LOW (ref 150–400)
RBC: 4.07 MIL/uL — AB (ref 4.22–5.81)
RDW: 21.2 % — ABNORMAL HIGH (ref 11.5–15.5)
WBC: 4.1 10*3/uL (ref 4.0–10.5)

## 2017-12-16 MED ORDER — EPOETIN ALFA 20000 UNIT/ML IJ SOLN
INTRAMUSCULAR | Status: AC
  Filled 2017-12-16: qty 1

## 2017-12-16 MED ORDER — EPOETIN ALFA 20000 UNIT/ML IJ SOLN
60000.0000 [IU] | Freq: Once | INTRAMUSCULAR | Status: AC
  Administered 2017-12-16: 60000 [IU] via SUBCUTANEOUS

## 2017-12-16 MED ORDER — EPOETIN ALFA 40000 UNIT/ML IJ SOLN
INTRAMUSCULAR | Status: AC
  Filled 2017-12-16: qty 1

## 2017-12-17 LAB — KAPPA/LAMBDA LIGHT CHAINS
KAPPA, LAMDA LIGHT CHAIN RATIO: 72.88 — AB (ref 0.26–1.65)
Kappa free light chain: 604.9 mg/L — ABNORMAL HIGH (ref 3.3–19.4)
Lambda free light chains: 8.3 mg/L (ref 5.7–26.3)

## 2017-12-17 LAB — FERRITIN: Ferritin: 943 ng/mL — ABNORMAL HIGH (ref 24–336)

## 2017-12-17 LAB — PROTEIN ELECTROPHORESIS, SERUM
A/G Ratio: 1.2 (ref 0.7–1.7)
ALPHA-2-GLOBULIN: 0.6 g/dL (ref 0.4–1.0)
Albumin ELP: 3.4 g/dL (ref 2.9–4.4)
Alpha-1-Globulin: 0.3 g/dL (ref 0.0–0.4)
BETA GLOBULIN: 1.7 g/dL — AB (ref 0.7–1.3)
GAMMA GLOBULIN: 0.3 g/dL — AB (ref 0.4–1.8)
Globulin, Total: 2.8 g/dL (ref 2.2–3.9)
M-Spike, %: 1 g/dL — ABNORMAL HIGH
Total Protein ELP: 6.2 g/dL (ref 6.0–8.5)

## 2017-12-17 LAB — IRON AND TIBC
Iron: 127 ug/dL (ref 42–163)
SATURATION RATIOS: 66 % — AB (ref 20–55)
TIBC: 193 ug/dL — AB (ref 202–409)
UIBC: 65 ug/dL — ABNORMAL LOW (ref 117–376)

## 2017-12-17 LAB — IGA: IGA: 1158 mg/dL — AB (ref 61–437)

## 2017-12-30 ENCOUNTER — Inpatient Hospital Stay: Payer: Medicare Other

## 2017-12-30 ENCOUNTER — Inpatient Hospital Stay: Payer: Medicare Other | Admitting: Nurse Practitioner

## 2018-01-01 ENCOUNTER — Other Ambulatory Visit: Payer: Self-pay | Admitting: *Deleted

## 2018-01-01 ENCOUNTER — Inpatient Hospital Stay: Payer: Medicare Other

## 2018-01-01 ENCOUNTER — Inpatient Hospital Stay: Payer: Medicare Other | Attending: Oncology

## 2018-01-01 ENCOUNTER — Encounter: Payer: Self-pay | Admitting: Nurse Practitioner

## 2018-01-01 ENCOUNTER — Inpatient Hospital Stay (HOSPITAL_BASED_OUTPATIENT_CLINIC_OR_DEPARTMENT_OTHER): Payer: Medicare Other | Admitting: Nurse Practitioner

## 2018-01-01 VITALS — BP 126/69 | HR 71 | Temp 97.5°F | Resp 17 | Ht 72.0 in | Wt 149.3 lb

## 2018-01-01 DIAGNOSIS — D631 Anemia in chronic kidney disease: Secondary | ICD-10-CM | POA: Diagnosis not present

## 2018-01-01 DIAGNOSIS — E538 Deficiency of other specified B group vitamins: Secondary | ICD-10-CM | POA: Diagnosis not present

## 2018-01-01 DIAGNOSIS — N189 Chronic kidney disease, unspecified: Secondary | ICD-10-CM | POA: Diagnosis not present

## 2018-01-01 DIAGNOSIS — C9000 Multiple myeloma not having achieved remission: Secondary | ICD-10-CM | POA: Insufficient documentation

## 2018-01-01 DIAGNOSIS — G6289 Other specified polyneuropathies: Secondary | ICD-10-CM | POA: Insufficient documentation

## 2018-01-01 DIAGNOSIS — D61818 Other pancytopenia: Secondary | ICD-10-CM | POA: Insufficient documentation

## 2018-01-01 DIAGNOSIS — Z7189 Other specified counseling: Secondary | ICD-10-CM | POA: Insufficient documentation

## 2018-01-01 DIAGNOSIS — R319 Hematuria, unspecified: Secondary | ICD-10-CM | POA: Insufficient documentation

## 2018-01-01 DIAGNOSIS — R300 Dysuria: Secondary | ICD-10-CM

## 2018-01-01 LAB — URINALYSIS, COMPLETE (UACMP) WITH MICROSCOPIC
BILIRUBIN URINE: NEGATIVE
Glucose, UA: NEGATIVE mg/dL
KETONES UR: NEGATIVE mg/dL
Nitrite: NEGATIVE
Protein, ur: 30 mg/dL — AB
RBC / HPF: >50 RBC/hpf — ABNORMAL HIGH (ref 0–5)
Specific Gravity, Urine: 1.015 (ref 1.005–1.030)
WBC, UA: >50 WBC/hpf — ABNORMAL HIGH (ref 0–5)
pH: 5 (ref 5.0–8.0)

## 2018-01-01 LAB — CBC WITH DIFFERENTIAL (CANCER CENTER ONLY)
ABS IMMATURE GRANULOCYTES: 0.02 10*3/uL (ref 0.00–0.07)
BASOS ABS: 0 10*3/uL (ref 0.0–0.1)
BASOS PCT: 0 %
EOS ABS: 0.1 10*3/uL (ref 0.0–0.5)
Eosinophils Relative: 2 %
HCT: 30.3 % — ABNORMAL LOW (ref 39.0–52.0)
Hemoglobin: 9.3 g/dL — ABNORMAL LOW (ref 13.0–17.0)
Immature Granulocytes: 1 %
Lymphocytes Relative: 24 %
Lymphs Abs: 0.8 10*3/uL (ref 0.7–4.0)
MCH: 22.9 pg — ABNORMAL LOW (ref 26.0–34.0)
MCHC: 30.7 g/dL (ref 30.0–36.0)
MCV: 74.6 fL — ABNORMAL LOW (ref 80.0–100.0)
Monocytes Absolute: 0.3 10*3/uL (ref 0.1–1.0)
Monocytes Relative: 9 %
NEUTROS ABS: 2.1 10*3/uL (ref 1.7–7.7)
NEUTROS PCT: 64 %
NRBC: 0 % (ref 0.0–0.2)
PLATELETS: 99 10*3/uL — AB (ref 150–400)
RBC: 4.06 MIL/uL — AB (ref 4.22–5.81)
RDW: 20.8 % — AB (ref 11.5–15.5)
WBC: 3.2 10*3/uL — AB (ref 4.0–10.5)

## 2018-01-01 MED ORDER — EPOETIN ALFA 20000 UNIT/ML IJ SOLN
60000.0000 [IU] | Freq: Once | INTRAMUSCULAR | Status: AC
  Administered 2018-01-01: 60000 [IU] via SUBCUTANEOUS

## 2018-01-01 MED ORDER — EPOETIN ALFA 40000 UNIT/ML IJ SOLN
INTRAMUSCULAR | Status: AC
  Filled 2018-01-01: qty 1

## 2018-01-01 MED ORDER — CIPROFLOXACIN HCL 250 MG PO TABS: 250.0000 mg | ORAL_TABLET | Freq: Two times a day (BID) | ORAL | 0 refills | Status: DC

## 2018-01-01 MED ORDER — EPOETIN ALFA 20000 UNIT/ML IJ SOLN
INTRAMUSCULAR | Status: AC
  Filled 2018-01-01: qty 1

## 2018-01-01 NOTE — Patient Instructions (Signed)
Epoetin Alfa injection °What is this medicine? °EPOETIN ALFA (e POE e tin AL fa) helps your body make more red blood cells. This medicine is used to treat anemia caused by chronic kidney failure, cancer chemotherapy, or HIV-therapy. It may also be used before surgery if you have anemia. °This medicine may be used for other purposes; ask your health care provider or pharmacist if you have questions. °COMMON BRAND NAME(S): Epogen, Procrit °What should I tell my health care provider before I take this medicine? °They need to know if you have any of these conditions: °-blood clotting disorders °-cancer patient not on chemotherapy °-cystic fibrosis °-heart disease, such as angina or heart failure °-hemoglobin level of 12 g/dL or greater °-high blood pressure °-low levels of folate, iron, or vitamin B12 °-seizures °-an unusual or allergic reaction to erythropoietin, albumin, benzyl alcohol, hamster proteins, other medicines, foods, dyes, or preservatives °-pregnant or trying to get pregnant °-breast-feeding °How should I use this medicine? °This medicine is for injection into a vein or under the skin. It is usually given by a health care professional in a hospital or clinic setting. °If you get this medicine at home, you will be taught how to prepare and give this medicine. Use exactly as directed. Take your medicine at regular intervals. Do not take your medicine more often than directed. °It is important that you put your used needles and syringes in a special sharps container. Do not put them in a trash can. If you do not have a sharps container, call your pharmacist or healthcare provider to get one. °A special MedGuide will be given to you by the pharmacist with each prescription and refill. Be sure to read this information carefully each time. °Talk to your pediatrician regarding the use of this medicine in children. While this drug may be prescribed for selected conditions, precautions do apply. °Overdosage: If you  think you have taken too much of this medicine contact a poison control center or emergency room at once. °NOTE: This medicine is only for you. Do not share this medicine with others. °What if I miss a dose? °If you miss a dose, take it as soon as you can. If it is almost time for your next dose, take only that dose. Do not take double or extra doses. °What may interact with this medicine? °Do not take this medicine with any of the following medications: °-darbepoetin alfa °This list may not describe all possible interactions. Give your health care provider a list of all the medicines, herbs, non-prescription drugs, or dietary supplements you use. Also tell them if you smoke, drink alcohol, or use illegal drugs. Some items may interact with your medicine. °What should I watch for while using this medicine? °Your condition will be monitored carefully while you are receiving this medicine. °You may need blood work done while you are taking this medicine. °What side effects may I notice from receiving this medicine? °Side effects that you should report to your doctor or health care professional as soon as possible: °-allergic reactions like skin rash, itching or hives, swelling of the face, lips, or tongue °-breathing problems °-changes in vision °-chest pain °-confusion, trouble speaking or understanding °-feeling faint or lightheaded, falls °-high blood pressure °-muscle aches or pains °-pain, swelling, warmth in the leg °-rapid weight gain °-severe headaches °-sudden numbness or weakness of the face, arm or leg °-trouble walking, dizziness, loss of balance or coordination °-seizures (convulsions) °-swelling of the ankles, feet, hands °-unusually weak or tired °  Side effects that usually do not require medical attention (report to your doctor or health care professional if they continue or are bothersome): °-diarrhea °-fever, chills (flu-like symptoms) °-headaches °-nausea, vomiting °-redness, stinging, or swelling at  site where injected °This list may not describe all possible side effects. Call your doctor for medical advice about side effects. You may report side effects to FDA at 1-800-FDA-1088. °Where should I keep my medicine? °Keep out of the reach of children. °Store in a refrigerator between 2 and 8 degrees C (36 and 46 degrees F). Do not freeze or shake. Throw away any unused portion if using a single-dose vial. Multi-dose vials can be kept in the refrigerator for up to 21 days after the initial dose. Throw away unused medicine. °NOTE: This sheet is a summary. It may not cover all possible information. If you have questions about this medicine, talk to your doctor, pharmacist, or health care provider. °© 2018 [employer]vier/Gold Standard (2015-09-03 19:42:31) ° °

## 2018-01-01 NOTE — Progress Notes (Addendum)
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  He continues every 2-week Procrit injections.  He was last transfused 12/04/2017.  He overall is feeling well.  No shortness of breath.  He has had hematuria intermittently for the past 2 weeks.  No fever or back pain.  He notes neck pain after "a long car ride".  Objective:  Vital signs in last 24 hours:  Blood pressure 126/69, pulse 71, temperature (!) 97.5 F (36.4 C), temperature source Oral, resp. rate 17, height 6' (1.829 m), weight 149 lb 4.8 oz (67.7 kg), SpO2 99 %.    Resp: Lungs clear bilaterally. Cardio: Regular rate and rhythm. GI: Abdomen soft and nontender.  No hepatosplenomegaly. Vascular: No leg edema.   Lab Results:  Lab Results  Component Value Date   WBC 3.2 (L) 01/01/2018   HGB 9.3 (L) 01/01/2018   HCT 30.3 (L) 01/01/2018   MCV 74.6 (L) 01/01/2018   PLT 99 (L) 01/01/2018   NEUTROABS 2.1 01/01/2018    Imaging:  No results found.  Medications: I have reviewed the patient's current medications.  Assessment/Plan: 1. Pancytopenia-transfused with packed red blood cells 11/28/2016, 12/27/2016, 01/13/2017, 02/02/2017 2. History of beta thalassemia trait 3. History of vitamin B-12 deficiency-vitamin B-12 replacement initiated 11/11/2016 4. History of a serum monoclonal IgAkappaprotein-elevated serum free kappa light chains, IgA, and M spike10/16/2018  Bone marrow biopsy 12/03/2016-consistent with multiple myeloma  Bone survey 12/06/2016-no destructive or lytic bone lesions identified.  Revlimid 21 days on/7 days off and weekly dexamethasone 12/16/2016(Decadron discontinued 12/29/2016 secondary to altered mental status)  Cycle 2 single agent Revlimid 01/14/2017(discontinued 3 days early)  Serum light chains, IgAimproved 02/02/2017  Cycle3 single agent Revlimid beginning 02/16/2017  Cycle4single agent Revlimid  beginning 03/16/2017  Cycle 5 single agent Revlimid beginning 04/14/2017(Revlimid placed on hold beginning 04/28/2017 due to thrombocytopenia)  Cycle 6 single agent Revlimid beginning 05/23/2017 x 14 days 5. COPD with acute and chronic respiratory failure/hypoxemia 6. History of coronary artery disease 7. Ischemic cardiomyopathy 8. ICD in place 9. Prostatic hypertrophy 10.Inappropriately low erythropoietin level 11/28/2016 11. Severe pancytopenia secondary to multiple myeloma and Revlimid-improved 12. Fever-likely related to an upper respiratory infection, RSV positive swab 02/02/2017 13.Anemia secondary to renal insufficiency-trial of weekly erythropoietin initiated 06/17/2017.Erythropoietin dose increased to 60,000 units weekly beginning 07/28/2017.  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  He will continue every 2-week erythropoietin.  We reviewed the most recent myeloma labs with him and his daughter at today's visit.  They understand the myeloma markers are increasing.  We reviewed options to include supportive care versus systemic therapy.  Systemic therapy options are limited due to pre-existing neuropathy and poor tolerance of steroids.  Dr. [name] recommends a trial of daratumumab.  We reviewed potential toxicities including an allergic reaction, wheezing, cough.  He would also receive Solu-Medrol which his daughter reports he has tolerated in the past.  He agrees to proceed.  He has poor venous access.  We are referring him for placement of a Port-A-Cath.  His daughter will make transportation arrangements and let us know when to schedule the first treatment.  We are anticipating approximately 2 weeks.  We will plan to see him prior to cycle 1.  Patient seen with Dr. [name].  Ned Card ANP/GNP-BC   01/01/2018  3:08 PM This was a shared visit with Ned Card.  We discussed treatment options with  [name] and his  daughter.  The markers of multiple myeloma are progressing.  We discussed treatment options with [name] and his daughter.  He does not wish to receive drugs which can cause neuropathy.  He cannot tolerate Decadron or prednisone.  He has tolerated Solu-Medrol in the past.  We decided to proceed with a trial of daratumumab, substituting Solu-Medrol for Decadron.  Julieanne Manson, MD

## 2018-01-01 NOTE — Patient Instructions (Signed)
Daratumumab injection What is this medicine? DARATUMUMAB (dar a toom ue mab) is a monoclonal antibody. It is used to treat multiple myeloma. This medicine may be used for other purposes; ask your health care provider or pharmacist if you have questions. COMMON BRAND NAME(S): DARZALEX What should I tell my health care provider before I take this medicine? They need to know if you have any of these conditions: -infection (especially a virus infection such as chickenpox, cold sores, or herpes) -lung or breathing disease -pregnant or trying to get pregnant -breast-feeding -an unusual or allergic reaction to daratumumab, other medicines, foods, dyes, or preservatives How should I use this medicine? This medicine is for infusion into a vein. It is given by a health care professional in a hospital or clinic setting. Talk to your pediatrician regarding the use of this medicine in children. Special care may be needed. Overdosage: If you think you have taken too much of this medicine contact a poison control center or emergency room at once. NOTE: This medicine is only for you. Do not share this medicine with others. What if I miss a dose? Keep appointments for follow-up doses as directed. It is important not to miss your dose. Call your doctor or health care professional if you are unable to keep an appointment. What may interact with this medicine? Interactions have not been studied. Give your health care provider a list of all the medicines, herbs, non-prescription drugs, or dietary supplements you use. Also tell them if you smoke, drink alcohol, or use illegal drugs. Some items may interact with your medicine. This list may not describe all possible interactions. Give your health care provider a list of all the medicines, herbs, non-prescription drugs, or dietary supplements you use. Also tell them if you smoke, drink alcohol, or use illegal drugs. Some items may interact with your medicine. What  should I watch for while using this medicine? This drug may make you feel generally unwell. Report any side effects. Continue your course of treatment even though you feel ill unless your doctor tells you to stop. This medicine can cause serious allergic reactions. To reduce your risk you may need to take medicine before treatment with this medicine. Take your medicine as directed. This medicine can affect the results of blood tests to match your blood type. These changes can last for up to 6 months after the final dose. Your healthcare provider will do blood tests to match your blood type before you start treatment. Tell all of your healthcare providers that you are being treated with this medicine before receiving a blood transfusion. This medicine can affect the results of some tests used to determine treatment response; extra tests may be needed to evaluate response. Do not become pregnant while taking this medicine or for 3 months after stopping it. Women should inform their doctor if they wish to become pregnant or think they might be pregnant. There is a potential for serious side effects to an unborn child. Talk to your health care professional or pharmacist for more information. What side effects may I notice from receiving this medicine? Side effects that you should report to your doctor or health care professional as soon as possible: -allergic reactions like skin rash, itching or hives, swelling of the face, lips, or tongue -breathing problems -chills -cough -dizziness -feeling faint or lightheaded -headache -low blood counts - this medicine may decrease the number of white blood cells, red blood cells and platelets. You may be at increased risk  for infections and bleeding. -nausea, vomiting -shortness of breath -signs of decreased platelets or bleeding - bruising, pinpoint red spots on the skin, black, tarry stools, blood in the urine -signs of decreased red blood cells - unusually  weak or tired, feeling faint or lightheaded, falls -signs of infection - fever or chills, cough, sore throat, pain or difficulty passing urine Side effects that usually do not require medical attention (report to your doctor or health care professional if they continue or are bothersome): -back pain -diarrhea -muscle cramps -pain, tingling, numbness in the hands or feet -swelling of the ankles, feet, hands -tiredness This list may not describe all possible side effects. Call your doctor for medical advice about side effects. You may report side effects to FDA at 1-800-FDA-1088. Where should I keep my medicine? Keep out of the reach of children. This drug is given in a hospital or clinic and will not be stored at home. NOTE: This sheet is a summary. It may not cover all possible information. If you have questions about this medicine, talk to your doctor, pharmacist, or health care provider.  2018 Elsevier/Gold Standard (2015-02-15 10:38:11)  

## 2018-01-03 LAB — URINE CULTURE: Culture: >=100000 — AB

## 2018-01-04 ENCOUNTER — Ambulatory Visit (INDEPENDENT_AMBULATORY_CARE_PROVIDER_SITE_OTHER): Payer: Medicare Other

## 2018-01-04 ENCOUNTER — Telehealth: Payer: Self-pay

## 2018-01-04 DIAGNOSIS — Z95810 Presence of automatic (implantable) cardiac defibrillator: Secondary | ICD-10-CM

## 2018-01-04 DIAGNOSIS — I5032 Chronic diastolic (congestive) heart failure: Secondary | ICD-10-CM | POA: Diagnosis not present

## 2018-01-04 NOTE — Progress Notes (Signed)
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 Heart Failure questions reviewed, pt asymptomatic.   Thoracic impedance normal.   Prescribed: Furosemide 20 mg Take 1 tablet (20 mg total) by mouth daily as needed for edema (prescribed 11/25/2017)  Labs: 12/16/2017 Creatinine 1.12, BUN 34, Potassium 4.3, Soidum 144, eGFR 56->60 12/02/2017 Creatinine 1.02, BUN 30, Potassium 4.4, Sodium 146, eGFR 64-74 11/06/2017 Creatinine 1.10, BUN 25, Potassium 3.8, Sodium 143, eGFR 57->60 10/07/2017 Creatinine1.09, BUN34, Potassium4.3, [id number], EGFR58->60 09/09/2017 Creatinine1.18, BUN34, Potassium4.0, [id number], EGFR52->60  06/15/2017 Creatinine1.30, BUN33, Potassium3.5, [id number], TOIZ12-45  06/01/2017 Creatinine1.29, BUN27, Potassium4.6, [id number], [id number]  04/27/2017 Creatinine1.14, BUN24, Potassium3.9, [id number], EGFR55->60 A complete set of results can be found in Results Review.  Recommendations: No changes.    Encouraged to call for fluid symptoms.  Follow-up plan: ICM clinic phone appointment on 02/22/2018.       Copy of ICM check sent to Dr. Rayann Heman.   3 month ICM trend: 01/04/2018    1 Year ICM trend:       Rosalene Billings, RN 01/04/2018 10:54 AM

## 2018-01-04 NOTE — Telephone Encounter (Signed)
TC to Pt. Per [name] spoke to Daughter [name] [name] to inform her that Urine cultures returned and they were positive for ecoli and is sensitive to Cipro , Pt. Started Cipro on Friday. Pt's daughter asked what can they do to prevent this from happening because Pt. Self catheterizes, went over hand hygiene and universal precautions. Daughter verbalized understanding.No further questions or concerns noted.

## 2018-01-05 ENCOUNTER — Telehealth: Payer: Self-pay | Admitting: Oncology

## 2018-01-05 ENCOUNTER — Other Ambulatory Visit: Payer: Self-pay | Admitting: Nurse Practitioner

## 2018-01-05 DIAGNOSIS — C9000 Multiple myeloma not having achieved remission: Secondary | ICD-10-CM

## 2018-01-05 NOTE — Telephone Encounter (Signed)
Scheduled appt per 12/9 sch message - left message for patient with appt date and  Time

## 2018-01-10 ENCOUNTER — Other Ambulatory Visit: Payer: Self-pay | Admitting: Oncology

## 2018-01-13 ENCOUNTER — Encounter: Payer: Self-pay | Admitting: *Deleted

## 2018-01-13 ENCOUNTER — Inpatient Hospital Stay: Payer: Medicare Other

## 2018-01-13 ENCOUNTER — Other Ambulatory Visit (HOSPITAL_COMMUNITY): Payer: Medicare Other

## 2018-01-13 ENCOUNTER — Ambulatory Visit (HOSPITAL_COMMUNITY): Payer: Medicare Other

## 2018-01-13 DIAGNOSIS — E538 Deficiency of other specified B group vitamins: Secondary | ICD-10-CM | POA: Diagnosis not present

## 2018-01-13 DIAGNOSIS — R319 Hematuria, unspecified: Secondary | ICD-10-CM | POA: Diagnosis not present

## 2018-01-13 DIAGNOSIS — D61818 Other pancytopenia: Secondary | ICD-10-CM | POA: Diagnosis not present

## 2018-01-13 DIAGNOSIS — C9000 Multiple myeloma not having achieved remission: Secondary | ICD-10-CM

## 2018-01-13 DIAGNOSIS — D631 Anemia in chronic kidney disease: Secondary | ICD-10-CM | POA: Diagnosis not present

## 2018-01-13 DIAGNOSIS — N189 Chronic kidney disease, unspecified: Secondary | ICD-10-CM | POA: Diagnosis not present

## 2018-01-13 LAB — CBC WITH DIFFERENTIAL (CANCER CENTER ONLY)
Abs Immature Granulocytes: 0.03 10*3/uL (ref 0.00–0.07)
Basophils Absolute: 0 10*3/uL (ref 0.0–0.1)
Basophils Relative: 0 %
Eosinophils Absolute: 0.1 10*3/uL (ref 0.0–0.5)
Eosinophils Relative: 2 %
HCT: 28.8 % — ABNORMAL LOW (ref 39.0–52.0)
Hemoglobin: 8.8 g/dL — ABNORMAL LOW (ref 13.0–17.0)
Immature Granulocytes: 1 %
Lymphocytes Relative: 20 %
Lymphs Abs: 1.1 10*3/uL (ref 0.7–4.0)
MCH: 22.9 pg — ABNORMAL LOW (ref 26.0–34.0)
MCHC: 30.6 g/dL (ref 30.0–36.0)
MCV: 74.8 fL — ABNORMAL LOW (ref 80.0–100.0)
MONO ABS: 0.2 10*3/uL (ref 0.1–1.0)
Monocytes Relative: 4 %
Neutro Abs: 4 10*3/uL (ref 1.7–7.7)
Neutrophils Relative %: 73 %
Platelet Count: 123 10*3/uL — ABNORMAL LOW (ref 150–400)
RBC: 3.85 MIL/uL — ABNORMAL LOW (ref 4.22–5.81)
RDW: 21.2 % — AB (ref 11.5–15.5)
WBC Count: 5.5 10*3/uL (ref 4.0–10.5)
nRBC: 0 % (ref 0.0–0.2)

## 2018-01-13 LAB — TYPE AND SCREEN
ABO/RH(D): A POS
Antibody Screen: NEGATIVE

## 2018-01-13 LAB — SAMPLE TO BLOOD BANK

## 2018-01-13 NOTE — Progress Notes (Signed)
Blood bank called to report that the pretreatment RBC phenotype can't be done here due to patient having a transfusion in the last 3 months. Will be sent out for outside testing and should have result by 12/20 at the latest.

## 2018-01-14 ENCOUNTER — Telehealth: Payer: Self-pay | Admitting: Cardiovascular Disease

## 2018-01-14 ENCOUNTER — Other Ambulatory Visit: Payer: Self-pay

## 2018-01-14 NOTE — Telephone Encounter (Signed)
Called patient's daughter back with Dr. Kyla Balzarine responses. Patient's daughter verbalized understanding.

## 2018-01-14 NOTE — Telephone Encounter (Signed)
Yes probably as it is not very invasive and if it works will decrease his need for transfusions and risk of infection

## 2018-01-14 NOTE — Telephone Encounter (Signed)
New  Message          Patient's daughter is calling to see if it's ok to take kemo and the  Medication that they are requesting that he take.? Pls call and advise.

## 2018-01-14 NOTE — Telephone Encounter (Signed)
Called Patient's daughter (DPR). She explained that patient's myeloma was back and the oncologist wants to do Chemo with Daratumumab and Solu- Medrol. Patient will have to also have a procedure to have a porta cath placed. Patient's daughter is wondering with her father's age (22) if he should be doing all this treatment. Patient's daughter would like to know what Dr. Johnsie Cancel thinks. Will forward to Dr. Johnsie Cancel for further advisement.

## 2018-01-15 ENCOUNTER — Telehealth: Payer: Self-pay | Admitting: Oncology

## 2018-01-15 NOTE — Telephone Encounter (Signed)
Scheduled appt per 12/19 sch message - left message on daughters phone with appt date and time

## 2018-01-22 ENCOUNTER — Telehealth: Payer: Self-pay | Admitting: *Deleted

## 2018-01-22 NOTE — Telephone Encounter (Signed)
Daughter spoke with Dr. [name] today and is requesting an appointment next week to come in with her father to discuss the new proposed treatment. Provided appointment for 12/31 at 4 pm.

## 2018-01-25 LAB — CUP PACEART REMOTE DEVICE CHECK
Date Time Interrogation Session: 20191230120608
Implantable Lead Implant Date: 20111117
Implantable Lead Implant Date: 20111117
Implantable Lead Location: 753859
Implantable Lead Model: 7122
Implantable Pulse Generator Implant Date: 20111117
MDC IDC LEAD LOCATION: 753860
Pulse Gen Serial Number: 623396

## 2018-01-26 ENCOUNTER — Inpatient Hospital Stay (HOSPITAL_BASED_OUTPATIENT_CLINIC_OR_DEPARTMENT_OTHER): Payer: Medicare Other | Admitting: Oncology

## 2018-01-26 VITALS — BP 137/77 | HR 63 | Temp 97.8°F | Resp 18 | Ht 72.0 in | Wt 150.8 lb

## 2018-01-26 DIAGNOSIS — N189 Chronic kidney disease, unspecified: Secondary | ICD-10-CM | POA: Diagnosis not present

## 2018-01-26 DIAGNOSIS — C9000 Multiple myeloma not having achieved remission: Secondary | ICD-10-CM

## 2018-01-26 DIAGNOSIS — E538 Deficiency of other specified B group vitamins: Secondary | ICD-10-CM

## 2018-01-26 DIAGNOSIS — D61818 Other pancytopenia: Secondary | ICD-10-CM | POA: Diagnosis not present

## 2018-01-26 DIAGNOSIS — D631 Anemia in chronic kidney disease: Secondary | ICD-10-CM

## 2018-01-26 DIAGNOSIS — R319 Hematuria, unspecified: Secondary | ICD-10-CM | POA: Diagnosis not present

## 2018-01-26 LAB — PRETREATMENT RBC PHENOTYPE

## 2018-01-26 NOTE — Progress Notes (Signed)
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  He is here with his 2 daughters.  He is currently scheduled for placement of a Port-A-Cath and daratumumab to begin within the next week.  He does not wish to undergo Port-A-Cath placement.  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  Objective:  Vital signs in last 24 hours:  Blood pressure 137/77, pulse 63, temperature 97.8 F (36.6 C), temperature source Oral, resp. rate 18, height 6' (1.829 m), weight 150 lb 12.8 oz (68.4 kg), SpO2 100 %.    Resp: Distant breath sounds, no respiratory distress Cardio: Distant heart sounds, regular rhythm Vascular: No leg edema   Lab Results:  Lab Results  Component Value Date   WBC 5.5 01/13/2018   HGB 8.8 (L) 01/13/2018   HCT 28.8 (L) 01/13/2018   MCV 74.8 (L) 01/13/2018   PLT 123 (L) 01/13/2018   NEUTROABS 4.0 01/13/2018    CMP  Lab Results  Component Value Date   NA 144 12/16/2017   K 4.3 12/16/2017   CL 107 12/16/2017   CO2 28 12/16/2017   GLUCOSE 92 12/16/2017   BUN 34 (H) 12/16/2017   CREATININE 1.12 12/16/2017   CALCIUM 8.7 (L) 12/16/2017   PROT 6.3 (L) 12/16/2017   ALBUMIN 3.4 (L) 12/16/2017   AST 12 (L) 12/16/2017   ALT 8 12/16/2017   ALKPHOS 50 12/16/2017   BILITOT 0.5 12/16/2017   GFRNONAA 56 (L) 12/16/2017   GFRAA >60 12/16/2017     Medications: I have reviewed the patient's current medications.   Assessment/Plan:   1. Pancytopenia-transfused with packed red blood cells 11/28/2016, 12/27/2016, 01/13/2017, 02/02/2017 2. History of beta thalassemia trait 3. History of vitamin B-12 deficiency-vitamin B-12 replacement initiated 11/11/2016 4. History of a serum monoclonal IgAkappaprotein-elevated serum free kappa light chains, IgA, and M spike10/16/2018  Bone marrow biopsy 12/03/2016-consistent with multiple myeloma  Bone survey  12/06/2016-no destructive or lytic bone lesions identified.  Revlimid 21 days on/7 days off and weekly dexamethasone 12/16/2016(Decadron discontinued 12/29/2016 secondary to altered mental status)  Cycle 2 single agent Revlimid 01/14/2017(discontinued 3 days early)  Serum light chains, IgAimproved 02/02/2017  Cycle3 single agent Revlimid beginning 02/16/2017  Cycle4single agent Revlimid beginning 03/16/2017  Cycle 5 single agent Revlimid beginning 04/14/2017(Revlimid placed on hold beginning 04/28/2017 due to thrombocytopenia)  Cycle 6 single agent Revlimid beginning 05/23/2017 x 14 days 5. COPD with acute and chronic respiratory failure/hypoxemia 6. History of coronary artery disease 7. Ischemic cardiomyopathy 8. ICD in place 9. Prostatic hypertrophy 10.Inappropriately low erythropoietin level 11/28/2016 11. Severe pancytopenia secondary to multiple myeloma and Revlimid-improved 12. Fever-likely related to an upper respiratory infection, RSV positive swab 02/02/2017 13.Anemia secondary to renal insufficiency-trial of weekly erythropoietin initiated 06/17/2017.Erythropoietin dose increased to 60,000 units weekly beginning 07/28/2017.  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  He is currently maintained off of specific treatment for myeloma.  The IgA and light chains are more elevated on the most recent labs.  He had been scheduled to begin treatment with daratumumab within the next few weeks.  A Port-A-Cath is scheduled to be placed.  He does not wish to undergo Port-A-Cath placement.  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  We discussed observation, treatment with daratumumab, Velcade based therapy, and melphalan/prednisone.  He is reluctant to consider any treatment that could  cause neuropathy.  He also has a history of difficulty taking steroids secondary to mental status changes.  I  reviewed potential toxicities associated with multiple systemic therapy regimens.  We decided to follow him with observation since he is asymptomatic from the myeloma.  If he requires more frequent transfusions the plan is to initiate treatment with melphalan/prednisone.  He will continue every 2-week erythropoietin therapy beginning 02/01/2018.  We will check a restaging myeloma panel once he is here on 02/01/2018.  He will be scheduled for an office visit 03/01/2018.  30 minutes were spent with the patient today.  The majority of the time was used for counseling and coordination of care.  [name], MD  01/26/2018  5:08 PM

## 2018-01-29 ENCOUNTER — Telehealth: Payer: Self-pay | Admitting: *Deleted

## 2018-01-29 ENCOUNTER — Telehealth: Payer: Self-pay | Admitting: Oncology

## 2018-01-29 DIAGNOSIS — H353112 Nonexudative age-related macular degeneration, right eye, intermediate dry stage: Secondary | ICD-10-CM | POA: Diagnosis not present

## 2018-01-29 DIAGNOSIS — H353221 Exudative age-related macular degeneration, left eye, with active choroidal neovascularization: Secondary | ICD-10-CM | POA: Diagnosis not present

## 2018-01-29 DIAGNOSIS — H35371 Puckering of macula, right eye: Secondary | ICD-10-CM | POA: Diagnosis not present

## 2018-01-29 DIAGNOSIS — H43813 Vitreous degeneration, bilateral: Secondary | ICD-10-CM | POA: Diagnosis not present

## 2018-01-29 NOTE — Telephone Encounter (Signed)
Added injection appointments per 1/2 schedule message. Other appointments remain the same. Left message for dtr [name].

## 2018-01-29 NOTE — Telephone Encounter (Signed)
Daughter called to follow up on the lab/Procrit appointment on 02/01/18. It needs to occur after 2pm. Sent secure chat to scheduler, Melissa to please schedule and call daughter. Also sent message to [facility] in managed care to obtain authorization.

## 2018-01-30 ENCOUNTER — Other Ambulatory Visit: Payer: Self-pay | Admitting: Oncology

## 2018-02-01 ENCOUNTER — Other Ambulatory Visit: Payer: Self-pay | Admitting: Radiology

## 2018-02-01 ENCOUNTER — Telehealth: Payer: Self-pay | Admitting: *Deleted

## 2018-02-01 ENCOUNTER — Inpatient Hospital Stay: Payer: Medicare Other | Attending: Oncology

## 2018-02-01 ENCOUNTER — Inpatient Hospital Stay: Payer: Medicare Other

## 2018-02-01 ENCOUNTER — Other Ambulatory Visit: Payer: Self-pay | Admitting: *Deleted

## 2018-02-01 VITALS — BP 152/70 | HR 68 | Temp 98.1°F | Resp 18

## 2018-02-01 DIAGNOSIS — E538 Deficiency of other specified B group vitamins: Secondary | ICD-10-CM

## 2018-02-01 DIAGNOSIS — D649 Anemia, unspecified: Secondary | ICD-10-CM

## 2018-02-01 DIAGNOSIS — D631 Anemia in chronic kidney disease: Secondary | ICD-10-CM | POA: Diagnosis not present

## 2018-02-01 DIAGNOSIS — N189 Chronic kidney disease, unspecified: Secondary | ICD-10-CM | POA: Insufficient documentation

## 2018-02-01 DIAGNOSIS — C9000 Multiple myeloma not having achieved remission: Secondary | ICD-10-CM

## 2018-02-01 LAB — SAMPLE TO BLOOD BANK

## 2018-02-01 LAB — CMP (CANCER CENTER ONLY)
ALT: 9 U/L (ref 0–44)
AST: 11 U/L — ABNORMAL LOW (ref 15–41)
Albumin: 3.3 g/dL — ABNORMAL LOW (ref 3.5–5.0)
Alkaline Phosphatase: 48 U/L (ref 38–126)
Anion gap: 8 (ref 5–15)
BUN: 28 mg/dL — ABNORMAL HIGH (ref 8–23)
CO2: 26 mmol/L (ref 22–32)
Calcium: 8.6 mg/dL — ABNORMAL LOW (ref 8.9–10.3)
Chloride: 112 mmol/L — ABNORMAL HIGH (ref 98–111)
Creatinine: 1.04 mg/dL (ref 0.61–1.24)
GFR, Estimated: >60 mL/min (ref >60)
Glucose, Bld: 93 mg/dL (ref 70–99)
Potassium: 3.9 mmol/L (ref 3.5–5.1)
Sodium: 146 mmol/L — ABNORMAL HIGH (ref 135–145)
Total Bilirubin: 0.5 mg/dL (ref 0.3–1.2)
Total Protein: 6.3 g/dL — ABNORMAL LOW (ref 6.5–8.1)

## 2018-02-01 LAB — CBC WITH DIFFERENTIAL (CANCER CENTER ONLY)
Abs Immature Granulocytes: 0.02 10*3/uL (ref 0.00–0.07)
BASOS ABS: 0 10*3/uL (ref 0.0–0.1)
Basophils Relative: 0 %
EOS PCT: 2 %
Eosinophils Absolute: 0.1 10*3/uL (ref 0.0–0.5)
HCT: 26.6 % — ABNORMAL LOW (ref 39.0–52.0)
Hemoglobin: 8 g/dL — ABNORMAL LOW (ref 13.0–17.0)
Immature Granulocytes: 1 %
Lymphocytes Relative: 23 %
Lymphs Abs: 0.9 10*3/uL (ref 0.7–4.0)
MCH: 22.1 pg — ABNORMAL LOW (ref 26.0–34.0)
MCHC: 30.1 g/dL (ref 30.0–36.0)
MCV: 73.5 fL — ABNORMAL LOW (ref 80.0–100.0)
Monocytes Absolute: 0.2 10*3/uL (ref 0.1–1.0)
Monocytes Relative: 5 %
NRBC: 0 % (ref 0.0–0.2)
Neutro Abs: 2.6 10*3/uL (ref 1.7–7.7)
Neutrophils Relative %: 69 %
PLATELETS: 106 10*3/uL — AB (ref 150–400)
RBC: 3.62 MIL/uL — ABNORMAL LOW (ref 4.22–5.81)
RDW: 20.9 % — ABNORMAL HIGH (ref 11.5–15.5)
WBC Count: 3.7 10*3/uL — ABNORMAL LOW (ref 4.0–10.5)

## 2018-02-01 LAB — PREPARE RBC (CROSSMATCH)

## 2018-02-01 MED ORDER — EPOETIN ALFA 20000 UNIT/ML IJ SOLN
60000.0000 [IU] | Freq: Once | INTRAMUSCULAR | Status: AC
  Administered 2018-02-01: 60000 [IU] via SUBCUTANEOUS

## 2018-02-01 MED ORDER — EPOETIN ALFA 20000 UNIT/ML IJ SOLN
INTRAMUSCULAR | Status: AC
  Filled 2018-02-01: qty 1

## 2018-02-01 MED ORDER — EPOETIN ALFA 40000 UNIT/ML IJ SOLN
INTRAMUSCULAR | Status: AC
  Filled 2018-02-01: qty 1

## 2018-02-01 NOTE — Telephone Encounter (Signed)
Informed daughter that Hgb is 8.0 and MD suggests a 1 unit transfusion. She agrees and is asking for transfusion on 02/03/18 at 2:30 pm or later due to caregiver transportation. Instructed daughter to call her father and tell him to keep his arm band on or he will need another crossmatch. Urgent scheduling message sent for appointment.

## 2018-02-02 ENCOUNTER — Telehealth: Payer: Self-pay | Admitting: Oncology

## 2018-02-02 LAB — PROTEIN ELECTROPHORESIS, SERUM
A/G RATIO SPE: 1.3 (ref 0.7–1.7)
Albumin ELP: 3.6 g/dL (ref 2.9–4.4)
Alpha-1-Globulin: 0.2 g/dL (ref 0.0–0.4)
Alpha-2-Globulin: 0.5 g/dL (ref 0.4–1.0)
BETA GLOBULIN: 1.8 g/dL — AB (ref 0.7–1.3)
Gamma Globulin: 0.2 g/dL — ABNORMAL LOW (ref 0.4–1.8)
Globulin, Total: 2.7 g/dL (ref 2.2–3.9)
M-Spike, %: 1 g/dL — ABNORMAL HIGH
Total Protein ELP: 6.3 g/dL (ref 6.0–8.5)

## 2018-02-02 LAB — IGA: IgA: 1233 mg/dL — ABNORMAL HIGH (ref 61–437)

## 2018-02-02 LAB — KAPPA/LAMBDA LIGHT CHAINS
Kappa free light chain: 644 mg/L — ABNORMAL HIGH (ref 3.3–19.4)
Kappa, lambda light chain ratio: 87.03 — ABNORMAL HIGH (ref 0.26–1.65)
Lambda free light chains: 7.4 mg/L (ref 5.7–26.3)

## 2018-02-02 NOTE — Telephone Encounter (Signed)
Scheduled appt per 1/6 sch message - pt daughter is aware of time

## 2018-02-03 ENCOUNTER — Inpatient Hospital Stay: Payer: Medicare Other

## 2018-02-03 ENCOUNTER — Ambulatory Visit (HOSPITAL_COMMUNITY): Payer: Medicare Other

## 2018-02-03 ENCOUNTER — Inpatient Hospital Stay (HOSPITAL_COMMUNITY): Admission: RE | Admit: 2018-02-03 | Payer: Medicare Other | Source: Ambulatory Visit

## 2018-02-03 VITALS — BP 144/56 | HR 57 | Temp 97.9°F | Resp 17

## 2018-02-03 DIAGNOSIS — N189 Chronic kidney disease, unspecified: Secondary | ICD-10-CM | POA: Diagnosis not present

## 2018-02-03 DIAGNOSIS — C9000 Multiple myeloma not having achieved remission: Secondary | ICD-10-CM

## 2018-02-03 DIAGNOSIS — D631 Anemia in chronic kidney disease: Secondary | ICD-10-CM | POA: Diagnosis not present

## 2018-02-03 DIAGNOSIS — M542 Cervicalgia: Secondary | ICD-10-CM

## 2018-02-03 DIAGNOSIS — D649 Anemia, unspecified: Secondary | ICD-10-CM

## 2018-02-03 MED ORDER — ACETAMINOPHEN 325 MG PO TABS
650.0000 mg | ORAL_TABLET | Freq: Once | ORAL | Status: AC
  Administered 2018-02-03: 650 mg via ORAL

## 2018-02-03 MED ORDER — ACETAMINOPHEN 325 MG PO TABS
ORAL_TABLET | ORAL | Status: AC
  Filled 2018-02-03: qty 2

## 2018-02-03 MED ORDER — SODIUM CHLORIDE 0.9% IV SOLUTION
250.0000 mL | Freq: Once | INTRAVENOUS | Status: AC
  Administered 2018-02-03: 250 mL via INTRAVENOUS
  Filled 2018-02-03: qty 250

## 2018-02-03 NOTE — Patient Instructions (Signed)
Blood Transfusion, Adult, Care After This sheet gives you information about how to care for yourself after your procedure. Your doctor may also give you more specific instructions. If you have problems or questions, contact your doctor. Follow these instructions at home:   Take over-the-counter and prescription medicines only as told by your doctor.  Go back to your normal activities as told by your doctor.  Follow instructions from your doctor about how to take care of the area where an IV tube was put into your vein (insertion site). Make sure you: ? Wash your hands with soap and water before you change your bandage (dressing). If there is no soap and water, use hand sanitizer. ? Change your bandage as told by your doctor.  Check your IV insertion site every day for signs of infection. Check for: ? More redness, swelling, or pain. ? More fluid or blood. ? Warmth. ? Pus or a bad smell. Contact a doctor if:  You have more redness, swelling, or pain around the IV insertion site.  You have more fluid or blood coming from the IV insertion site.  Your IV insertion site feels warm to the touch.  You have pus or a bad smell coming from the IV insertion site.  Your pee (urine) turns pink, red, or brown.  You feel weak after doing your normal activities. Get help right away if:  You have signs of a serious allergic or body defense (immune) system reaction, including: ? Itchiness. ? Hives. ? Trouble breathing. ? Anxiety. ? Pain in your chest or lower back. ? Fever, flushing, and chills. ? Fast pulse. ? Rash. ? Watery poop (diarrhea). ? Throwing up (vomiting). ? Dark pee. ? Serious headache. ? Dizziness. ? Stiff neck. ? Yellow color in your face or the white parts of your eyes (jaundice). Summary  After a blood transfusion, return to your normal activities as told by your doctor.  Every day, check for signs of infection where the IV tube was put into your vein.  Some  signs of infection are warm skin, more redness and pain, more fluid or blood, and pus or a bad smell where the needle went in.  Contact your doctor if you feel weak or have any unusual symptoms. This information is not intended to replace advice given to you by your health care provider. Make sure you discuss any questions you have with your health care provider. Document Released: 02/03/2014 Document Revised: 09/07/2015 Document Reviewed: 09/07/2015 Elsevier Interactive Patient Education  2019 Elsevier Inc.  

## 2018-02-04 LAB — TYPE AND SCREEN
ABO/RH(D): A POS
Antibody Screen: NEGATIVE
Unit division: 0

## 2018-02-04 LAB — BPAM RBC
Blood Product Expiration Date: 202001312359
ISSUE DATE / TIME: 202001081456
Unit Type and Rh: 5100

## 2018-02-05 ENCOUNTER — Other Ambulatory Visit: Payer: Medicare Other

## 2018-02-05 ENCOUNTER — Ambulatory Visit: Payer: Medicare Other

## 2018-02-12 ENCOUNTER — Other Ambulatory Visit: Payer: Medicare Other

## 2018-02-12 ENCOUNTER — Ambulatory Visit: Payer: Medicare Other

## 2018-02-12 ENCOUNTER — Ambulatory Visit: Payer: Medicare Other | Admitting: Oncology

## 2018-02-15 ENCOUNTER — Inpatient Hospital Stay: Payer: Medicare Other

## 2018-02-15 VITALS — BP 133/74 | HR 60 | Resp 16

## 2018-02-15 DIAGNOSIS — C9000 Multiple myeloma not having achieved remission: Secondary | ICD-10-CM | POA: Diagnosis not present

## 2018-02-15 DIAGNOSIS — E538 Deficiency of other specified B group vitamins: Secondary | ICD-10-CM

## 2018-02-15 DIAGNOSIS — N189 Chronic kidney disease, unspecified: Secondary | ICD-10-CM | POA: Diagnosis not present

## 2018-02-15 DIAGNOSIS — D631 Anemia in chronic kidney disease: Secondary | ICD-10-CM | POA: Diagnosis not present

## 2018-02-15 LAB — CBC WITH DIFFERENTIAL (CANCER CENTER ONLY)
Abs Immature Granulocytes: 0.01 10*3/uL (ref 0.00–0.07)
Basophils Absolute: 0 10*3/uL (ref 0.0–0.1)
Basophils Relative: 0 %
Eosinophils Absolute: 0.1 10*3/uL (ref 0.0–0.5)
Eosinophils Relative: 3 %
HEMATOCRIT: 29.1 % — AB (ref 39.0–52.0)
Hemoglobin: 8.8 g/dL — ABNORMAL LOW (ref 13.0–17.0)
Immature Granulocytes: 0 %
Lymphocytes Relative: 24 %
Lymphs Abs: 0.8 10*3/uL (ref 0.7–4.0)
MCH: 23 pg — AB (ref 26.0–34.0)
MCHC: 30.2 g/dL (ref 30.0–36.0)
MCV: 76 fL — AB (ref 80.0–100.0)
Monocytes Absolute: 0.2 10*3/uL (ref 0.1–1.0)
Monocytes Relative: 5 %
Neutro Abs: 2.4 10*3/uL (ref 1.7–7.7)
Neutrophils Relative %: 68 %
Platelet Count: 97 10*3/uL — ABNORMAL LOW (ref 150–400)
RBC: 3.83 MIL/uL — ABNORMAL LOW (ref 4.22–5.81)
RDW: 22 % — ABNORMAL HIGH (ref 11.5–15.5)
WBC: 3.5 10*3/uL — AB (ref 4.0–10.5)
nRBC: 0 % (ref 0.0–0.2)

## 2018-02-15 MED ORDER — EPOETIN ALFA 20000 UNIT/ML IJ SOLN
INTRAMUSCULAR | Status: AC
  Filled 2018-02-15: qty 3

## 2018-02-15 MED ORDER — EPOETIN ALFA 20000 UNIT/ML IJ SOLN
60000.0000 [IU] | Freq: Once | INTRAMUSCULAR | Status: AC
  Administered 2018-02-15: 60000 [IU] via SUBCUTANEOUS

## 2018-02-15 NOTE — Patient Instructions (Signed)
Epoetin Alfa injection °What is this medicine? °EPOETIN ALFA (e POE e tin AL fa) helps your body make more red blood cells. This medicine is used to treat anemia caused by chronic kidney disease, cancer chemotherapy, or HIV-therapy. It may also be used before surgery if you have anemia. °This medicine may be used for other purposes; ask your health care provider or pharmacist if you have questions. °COMMON BRAND NAME(S): Epogen, Procrit, Retacrit °What should I tell my health care provider before I take this medicine? °They need to know if you have any of these conditions: °-cancer °-heart disease °-high blood pressure °-history of blood clots °-history of stroke °-low levels of folate, iron, or vitamin B12 in the blood °-seizures °-an unusual or allergic reaction to erythropoietin, albumin, benzyl alcohol, hamster proteins, other medicines, foods, dyes, or preservatives °-pregnant or trying to get pregnant °-breast-feeding °How should I use this medicine? °This medicine is for injection into a vein or under the skin. It is usually given by a health care professional in a hospital or clinic setting. °If you get this medicine at home, you will be taught how to prepare and give this medicine. Use exactly as directed. Take your medicine at regular intervals. Do not take your medicine more often than directed. °It is important that you put your used needles and syringes in a special sharps container. Do not put them in a trash can. If you do not have a sharps container, call your pharmacist or healthcare provider to get one. °A special MedGuide will be given to you by the pharmacist with each prescription and refill. Be sure to read this information carefully each time. °Talk to your pediatrician regarding the use of this medicine in children. While this drug may be prescribed for selected conditions, precautions do apply. °Overdosage: If you think you have taken too much of this medicine contact a poison control center  or emergency room at once. °NOTE: This medicine is only for you. Do not share this medicine with others. °What if I miss a dose? °If you miss a dose, take it as soon as you can. If it is almost time for your next dose, take only that dose. Do not take double or extra doses. °What may interact with this medicine? °Interactions have not been studied. °This list may not describe all possible interactions. Give your health care provider a list of all the medicines, herbs, non-prescription drugs, or dietary supplements you use. Also tell them if you smoke, drink alcohol, or use illegal drugs. Some items may interact with your medicine. °What should I watch for while using this medicine? °Your condition will be monitored carefully while you are receiving this medicine. °You may need blood work done while you are taking this medicine. °This medicine may cause a decrease in vitamin B6. You should make sure that you get enough vitamin B6 while you are taking this medicine. Discuss the foods you eat and the vitamins you take with your health care professional. °What side effects may I notice from receiving this medicine? °Side effects that you should report to your doctor or health care professional as soon as possible: °-allergic reactions like skin rash, itching or hives, swelling of the face, lips, or tongue °-seizures °-signs and symptoms of a blood clot such as breathing problems; changes in vision; chest pain; severe, sudden headache; pain, swelling, warmth in the leg; trouble speaking; sudden numbness or weakness of the face, arm or leg °-signs and symptoms of a stroke   like changes in vision; confusion; trouble speaking or understanding; severe headaches; sudden numbness or weakness of the face, arm or leg; trouble walking; dizziness; loss of balance or coordination °Side effects that usually do not require medical attention (report to your doctor or health care professional if they continue or are  bothersome): °-chills °-cough °-dizziness °-fever °-headaches °-joint pain °-muscle cramps °-muscle pain °-nausea, vomiting °-pain, redness, or irritation at site where injected °This list may not describe all possible side effects. Call your doctor for medical advice about side effects. You may report side effects to FDA at 1-800-FDA-1088. °Where should I keep my medicine? °Keep out of the reach of children. °Store in a refrigerator between 2 and 8 degrees C (36 and 46 degrees F). Do not freeze or shake. Throw away any unused portion if using a single-dose vial. Multi-dose vials can be kept in the refrigerator for up to 21 days after the initial dose. Throw away unused medicine. °NOTE: This sheet is a summary. It may not cover all possible information. If you have questions about this medicine, talk to your doctor, pharmacist, or health care provider. °© 2019 Elsevier/Gold Standard (2016-08-22 08:35:19) ° °

## 2018-02-19 MED ORDER — CYANOCOBALAMIN 1000 MCG/ML IJ SOLN
INTRAMUSCULAR | Status: AC
  Filled 2018-02-19: qty 1

## 2018-02-22 ENCOUNTER — Ambulatory Visit (INDEPENDENT_AMBULATORY_CARE_PROVIDER_SITE_OTHER): Payer: Medicare Other

## 2018-02-22 DIAGNOSIS — I255 Ischemic cardiomyopathy: Secondary | ICD-10-CM

## 2018-02-22 DIAGNOSIS — I5032 Chronic diastolic (congestive) heart failure: Secondary | ICD-10-CM

## 2018-02-23 ENCOUNTER — Ambulatory Visit (INDEPENDENT_AMBULATORY_CARE_PROVIDER_SITE_OTHER): Payer: Medicare Other

## 2018-02-23 DIAGNOSIS — I5032 Chronic diastolic (congestive) heart failure: Secondary | ICD-10-CM

## 2018-02-23 DIAGNOSIS — Z95810 Presence of automatic (implantable) cardiac defibrillator: Secondary | ICD-10-CM | POA: Diagnosis not present

## 2018-02-23 LAB — CUP PACEART REMOTE DEVICE CHECK
Date Time Interrogation Session: 20200128205907
Implantable Lead Implant Date: 20111117
Implantable Lead Implant Date: 20111117
Implantable Lead Location: 753859
Implantable Lead Location: 753860
Implantable Lead Model: 7122
Implantable Pulse Generator Implant Date: 20111117
Pulse Gen Serial Number: 623396

## 2018-02-23 NOTE — Progress Notes (Signed)
Remote ICD transmission.   

## 2018-02-25 NOTE — Progress Notes (Signed)
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   Thoracic impedance normal.   Prescribed: Furosemide 20 mgTake 1 tablet (20 mg total) by mouth daily as needed for edema(prescribed 11/25/2017)  Labs: 12/16/2017 Creatinine 1.12, BUN 34, Potassium 4.3, Soidum 144, eGFR 56->60 12/02/2017 Creatinine 1.02, BUN 30, Potassium 4.4, Sodium 146, eGFR 64-74 11/06/2017 Creatinine 1.10, BUN 25, Potassium 3.8, Sodium 143, eGFR 57->60 10/07/2017 Creatinine1.09, BUN34, Potassium4.3, [id number], EGFR58->60 09/09/2017 Creatinine1.18, BUN34, Potassium4.0, [id number], EGFR52->60  06/15/2017 Creatinine1.30, BUN33, Potassium3.5, [id number], [id number]  06/01/2017 Creatinine1.29, BUN27, Potassium4.6, [id number], [id number]  04/27/2017 Creatinine1.14, BUN24, Potassium3.9, [id number], EGFR55->60 A complete set of results can be found in Results Review.  Recommendations: No changes.    Encouraged to call for fluid symptoms.  Follow-up plan: ICM clinic phone appointment on 03/29/2018.       Copy of ICM check sent to Dr. Rayann Heman.   3 month ICM trend: 02/22/2018    1 Year ICM trend:       Rosalene Billings, RN 02/25/2018 5:15 PM

## 2018-03-01 ENCOUNTER — Inpatient Hospital Stay: Payer: Medicare Other | Attending: Oncology

## 2018-03-01 ENCOUNTER — Telehealth: Payer: Self-pay

## 2018-03-01 ENCOUNTER — Inpatient Hospital Stay (HOSPITAL_BASED_OUTPATIENT_CLINIC_OR_DEPARTMENT_OTHER): Payer: Medicare Other | Admitting: Nurse Practitioner

## 2018-03-01 ENCOUNTER — Ambulatory Visit: Payer: Medicare Other

## 2018-03-01 ENCOUNTER — Inpatient Hospital Stay: Payer: Medicare Other

## 2018-03-01 ENCOUNTER — Encounter: Payer: Self-pay | Admitting: Nurse Practitioner

## 2018-03-01 VITALS — BP 129/65 | HR 72 | Temp 97.6°F | Resp 17 | Ht 72.0 in | Wt 150.1 lb

## 2018-03-01 DIAGNOSIS — D563 Thalassemia minor: Secondary | ICD-10-CM | POA: Insufficient documentation

## 2018-03-01 DIAGNOSIS — D631 Anemia in chronic kidney disease: Secondary | ICD-10-CM | POA: Insufficient documentation

## 2018-03-01 DIAGNOSIS — C9000 Multiple myeloma not having achieved remission: Secondary | ICD-10-CM

## 2018-03-01 DIAGNOSIS — D61818 Other pancytopenia: Secondary | ICD-10-CM

## 2018-03-01 DIAGNOSIS — I255 Ischemic cardiomyopathy: Secondary | ICD-10-CM | POA: Insufficient documentation

## 2018-03-01 DIAGNOSIS — N189 Chronic kidney disease, unspecified: Secondary | ICD-10-CM | POA: Insufficient documentation

## 2018-03-01 LAB — SAMPLE TO BLOOD BANK

## 2018-03-01 LAB — CBC WITH DIFFERENTIAL (CANCER CENTER ONLY)
Abs Immature Granulocytes: 0.01 10*3/uL (ref 0.00–0.07)
BASOS PCT: 0 %
Basophils Absolute: 0 10*3/uL (ref 0.0–0.1)
Eosinophils Absolute: 0 10*3/uL (ref 0.0–0.5)
Eosinophils Relative: 1 %
HCT: 28.6 % — ABNORMAL LOW (ref 39.0–52.0)
Hemoglobin: 8.8 g/dL — ABNORMAL LOW (ref 13.0–17.0)
Immature Granulocytes: 0 %
Lymphocytes Relative: 21 %
Lymphs Abs: 1.1 10*3/uL (ref 0.7–4.0)
MCH: 23.1 pg — ABNORMAL LOW (ref 26.0–34.0)
MCHC: 30.8 g/dL (ref 30.0–36.0)
MCV: 75.1 fL — ABNORMAL LOW (ref 80.0–100.0)
Monocytes Absolute: 0.3 10*3/uL (ref 0.1–1.0)
Monocytes Relative: 6 %
Neutro Abs: 3.6 10*3/uL (ref 1.7–7.7)
Neutrophils Relative %: 72 %
Platelet Count: 87 10*3/uL — ABNORMAL LOW (ref 150–400)
RBC: 3.81 MIL/uL — ABNORMAL LOW (ref 4.22–5.81)
RDW: 21.9 % — ABNORMAL HIGH (ref 11.5–15.5)
WBC: 5 10*3/uL (ref 4.0–10.5)
nRBC: 0 % (ref 0.0–0.2)

## 2018-03-01 MED ORDER — EPOETIN ALFA 20000 UNIT/ML IJ SOLN
INTRAMUSCULAR | Status: AC
  Filled 2018-03-01: qty 1

## 2018-03-01 MED ORDER — EPOETIN ALFA 20000 UNIT/ML IJ SOLN
60000.0000 [IU] | Freq: Once | INTRAMUSCULAR | Status: AC
  Administered 2018-03-01: 60000 [IU] via SUBCUTANEOUS

## 2018-03-01 MED ORDER — EPOETIN ALFA 40000 UNIT/ML IJ SOLN
INTRAMUSCULAR | Status: AC
  Filled 2018-03-01: qty 1

## 2018-03-01 NOTE — Telephone Encounter (Signed)
TC from lab to report hemoglobin 8.8  Lisa aware.

## 2018-03-01 NOTE — Progress Notes (Addendum)
  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  He continues every 2-week Procrit, last injection 02/15/2018.  He was last transfused a unit of blood 02/03/2018.  He overall is feeling well.  He thinks he might have a "sore" on the left buttock.  Unchanged dyspnea.  No falls.  Bowels are moving.  Objective:  Vital signs in last 24 hours:  Blood pressure 129/65, pulse 72, temperature 97.6 F (36.4 C), temperature source Oral, resp. rate 17, height 6' (1.829 m), weight 150 lb 1.6 oz (68.1 kg), SpO2 98 %.    HEENT: No thrush or ulcers. Resp: Distant breath sounds.  No respiratory distress. Cardio: Distant heart sounds, regular rhythm GI: Abdomen soft and nontender. Vascular: No leg edema.   Lab Results:  Lab Results  Component Value Date   WBC 5.0 03/01/2018   HGB 8.8 (L) 03/01/2018   HCT 28.6 (L) 03/01/2018   MCV 75.1 (L) 03/01/2018   PLT 87 (L) 03/01/2018   NEUTROABS 3.6 03/01/2018    Imaging:  No results found.  Medications: I have reviewed the patient's current medications.  Assessment/Plan:  1. Pancytopenia-transfused with packed red blood cells 11/28/2016, 12/27/2016, 01/13/2017, 02/02/2017 2. History of beta thalassemia trait 3. History of vitamin B-12 deficiency-vitamin B-12 replacement initiated 11/11/2016 4. History of a serum monoclonal IgAkappaprotein-elevated serum free kappa light chains, IgA, and M spike10/16/2018  Bone marrow biopsy 12/03/2016-consistent with multiple myeloma  Bone survey 12/06/2016-no destructive or lytic bone lesions identified.  Revlimid 21 days on/7 days off and weekly dexamethasone 12/16/2016(Decadron discontinued 12/29/2016 secondary to altered mental status)  Cycle 2 single agent Revlimid 01/14/2017(discontinued 3 days early)  Serum light chains, IgAimproved 02/02/2017  Cycle3 single agent Revlimid beginning 02/16/2017  Cycle4single  agent Revlimid beginning 03/16/2017  Cycle 5 single agent Revlimid beginning 04/14/2017(Revlimid placed on hold beginning 04/28/2017 due to thrombocytopenia)  Cycle 6 single agent Revlimid beginning 05/23/2017 x 14 days 5. COPD with acute and chronic respiratory failure/hypoxemia 6. History of coronary artery disease 7. Ischemic cardiomyopathy 8. ICD in place 9. Prostatic hypertrophy 10.Inappropriately low erythropoietin level 11/28/2016 11. Severe pancytopenia secondary to multiple myeloma and Revlimid-improved 12. Fever-likely related to an upper respiratory infection, RSV positive swab 02/02/2017 13.Anemia secondary to renal insufficiency-trial of weekly erythropoietin initiated 06/17/2017.Erythropoietin dose increased to 60,000 units weekly beginning 07/28/2017.  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  We reviewed the CBC from today.  Hemoglobin is stable.  Plan to continue Procrit every 2 weeks, as needed red cell transfusion support.  We will check a restaging myeloma panel at the time of his next injection.  The plan is to initiate treatment with melphalan/prednisone if he begins to require more frequent transfusions.  We will see him in follow-up in 6 weeks.  He will contact the office in the interim with any problems.  Patient seen with Dr. [name].    Ned Card ANP/GNP-BC   03/01/2018  2:48 PM  This was a shared visit with Ned Card.  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  The hemoglobin is stable.  He will continue every 2-week erythropoietin therapy.  We will follow-up on the myeloma panel when he returns in 2 weeks. Julieanne Manson, MD

## 2018-03-03 ENCOUNTER — Telehealth: Payer: Self-pay | Admitting: Oncology

## 2018-03-03 NOTE — Telephone Encounter (Signed)
Scheduled appt per 2/3 los - pt daughter is aware of appt date and times

## 2018-03-11 ENCOUNTER — Encounter: Payer: Self-pay | Admitting: Cardiovascular Disease

## 2018-03-15 ENCOUNTER — Inpatient Hospital Stay: Payer: Medicare Other

## 2018-03-15 VITALS — BP 130/72 | HR 70

## 2018-03-15 DIAGNOSIS — E538 Deficiency of other specified B group vitamins: Secondary | ICD-10-CM

## 2018-03-15 DIAGNOSIS — D563 Thalassemia minor: Secondary | ICD-10-CM | POA: Diagnosis not present

## 2018-03-15 DIAGNOSIS — D61818 Other pancytopenia: Secondary | ICD-10-CM | POA: Diagnosis not present

## 2018-03-15 DIAGNOSIS — C9000 Multiple myeloma not having achieved remission: Secondary | ICD-10-CM

## 2018-03-15 DIAGNOSIS — D631 Anemia in chronic kidney disease: Secondary | ICD-10-CM | POA: Diagnosis not present

## 2018-03-15 DIAGNOSIS — N189 Chronic kidney disease, unspecified: Secondary | ICD-10-CM | POA: Diagnosis not present

## 2018-03-15 DIAGNOSIS — I255 Ischemic cardiomyopathy: Secondary | ICD-10-CM | POA: Diagnosis not present

## 2018-03-15 LAB — CMP (CANCER CENTER ONLY)
ALT: 9 U/L (ref 0–44)
AST: 13 U/L — ABNORMAL LOW (ref 15–41)
Albumin: 3.4 g/dL — ABNORMAL LOW (ref 3.5–5.0)
Alkaline Phosphatase: 51 U/L (ref 38–126)
Anion gap: 10 (ref 5–15)
BUN: 25 mg/dL — ABNORMAL HIGH (ref 8–23)
CO2: 25 mmol/L (ref 22–32)
Calcium: 8.3 mg/dL — ABNORMAL LOW (ref 8.9–10.3)
Chloride: 109 mmol/L (ref 98–111)
Creatinine: 1.09 mg/dL (ref 0.61–1.24)
GFR, Est AFR Am: >60 mL/min (ref >60)
GFR, Estimated: 59 mL/min — ABNORMAL LOW (ref >60)
Glucose, Bld: 104 mg/dL — ABNORMAL HIGH (ref 70–99)
POTASSIUM: 4.3 mmol/L (ref 3.5–5.1)
Sodium: 144 mmol/L (ref 135–145)
TOTAL PROTEIN: 6.8 g/dL (ref 6.5–8.1)
Total Bilirubin: 0.4 mg/dL (ref 0.3–1.2)

## 2018-03-15 LAB — CBC WITH DIFFERENTIAL (CANCER CENTER ONLY)
Abs Immature Granulocytes: 0.01 10*3/uL (ref 0.00–0.07)
BASOS PCT: 0 %
Basophils Absolute: 0 10*3/uL (ref 0.0–0.1)
Eosinophils Absolute: 0.1 10*3/uL (ref 0.0–0.5)
Eosinophils Relative: 2 %
HCT: 27.9 % — ABNORMAL LOW (ref 39.0–52.0)
Hemoglobin: 8.6 g/dL — ABNORMAL LOW (ref 13.0–17.0)
Immature Granulocytes: 0 %
Lymphocytes Relative: 34 %
Lymphs Abs: 1 10*3/uL (ref 0.7–4.0)
MCH: 23.2 pg — ABNORMAL LOW (ref 26.0–34.0)
MCHC: 30.8 g/dL (ref 30.0–36.0)
MCV: 75.4 fL — ABNORMAL LOW (ref 80.0–100.0)
MONO ABS: 0.2 10*3/uL (ref 0.1–1.0)
Monocytes Relative: 5 %
NEUTROS ABS: 1.8 10*3/uL (ref 1.7–7.7)
Neutrophils Relative %: 59 %
Platelet Count: 118 10*3/uL — ABNORMAL LOW (ref 150–400)
RBC: 3.7 MIL/uL — ABNORMAL LOW (ref 4.22–5.81)
RDW: 22.3 % — ABNORMAL HIGH (ref 11.5–15.5)
WBC Count: 3.1 10*3/uL — ABNORMAL LOW (ref 4.0–10.5)
nRBC: 0.7 % — ABNORMAL HIGH (ref 0.0–0.2)

## 2018-03-15 MED ORDER — EPOETIN ALFA 40000 UNIT/ML IJ SOLN
INTRAMUSCULAR | Status: AC
  Filled 2018-03-15: qty 1

## 2018-03-15 MED ORDER — EPOETIN ALFA 20000 UNIT/ML IJ SOLN
INTRAMUSCULAR | Status: AC
  Filled 2018-03-15: qty 1

## 2018-03-15 MED ORDER — EPOETIN ALFA 20000 UNIT/ML IJ SOLN
60000.0000 [IU] | Freq: Once | INTRAMUSCULAR | Status: AC
  Administered 2018-03-15: 60000 [IU] via SUBCUTANEOUS

## 2018-03-16 LAB — PROTEIN ELECTROPHORESIS, SERUM
A/G Ratio: 1.1 (ref 0.7–1.7)
Albumin ELP: 3.3 g/dL (ref 2.9–4.4)
Alpha-1-Globulin: 0.2 g/dL (ref 0.0–0.4)
Alpha-2-Globulin: 0.5 g/dL (ref 0.4–1.0)
BETA GLOBULIN: 1.9 g/dL — AB (ref 0.7–1.3)
GAMMA GLOBULIN: 0.3 g/dL — AB (ref 0.4–1.8)
Globulin, Total: 2.9 g/dL (ref 2.2–3.9)
M-Spike, %: 1 g/dL — ABNORMAL HIGH
Total Protein ELP: 6.2 g/dL (ref 6.0–8.5)

## 2018-03-16 LAB — KAPPA/LAMBDA LIGHT CHAINS
Kappa free light chain: 771.6 mg/L — ABNORMAL HIGH (ref 3.3–19.4)
Kappa, lambda light chain ratio: 96.45 — ABNORMAL HIGH (ref 0.26–1.65)
Lambda free light chains: 8 mg/L (ref 5.7–26.3)

## 2018-03-16 LAB — IGA: IgA: 1486 mg/dL — ABNORMAL HIGH (ref 61–437)

## 2018-03-25 NOTE — Progress Notes (Signed)
Patient ID: [name] Azure, MD, male   DOB: April 07, 1925, 85 y.o.   MRN: [id number]    Cardiology Office Note    Date:  03/29/2018   ID:  [name] Azure, MD, DOB 1925/03/09, MRN [id number]  PCP:  [name], MD  Cardiologist: Dr. Johnsie Cancel Dr. 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   In 2011, he survived a prolonged out of hospital cardiac arrest with ventricular fibrillation. He was found to have CAD with stents in RCA/circumflex and RI. EF 31% with large area of scar by MRI. St Jude AICD subsequently placed. He has not been hospitalized for his heart since then with no AICD d/c and no clinical CHF. Last 2D ECHO in 2015 showed EF 45-50%.   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 Anemia improved with erythropoietin Rx Plavix stopped due to hematologic issues  Grand-daughter Oley Balm is senior at [employer] her boyfriend is with Patricia today and helping him while he tries to reapply to dental school  His myeloma has progressed Daughter had questions about aggressiveness of Rx and port a cath given his age  Required transfusion in January for Hb of 8 Getting Procrit as well     His care giver Cloyde Reams indicates he is in his motorized wheel chair most of the time   Lab Results  Component Value Date   CREATININE 1.09 03/15/2018   BUN 25 (H) 03/15/2018   NA 144 03/15/2018   K 4.3 03/15/2018   CL 109 03/15/2018   CO2 25 03/15/2018    BNP    Component Value Date/Time   BNP 124.2 (H) 11/27/2016  1700   BNP 45.9 06/06/2015 1629    ProBNP    Component Value Date/Time   PROBNP 1,328 (H) 12/02/2017 1553   PROBNP 759.3 (H) 11/11/2013 2139      Past Medical History:  Diagnosis Date  . Anemia, unspecified   . CAD (coronary artery disease)   . Cataracts, bilateral   . CHF (congestive heart failure) ([place])   . Chronic airway obstruction, not elsewhere classified    on o2 at night  . Diabetes mellitus   . Diverticulosis of colon (without mention of hemorrhage)   . Dyslipidemia   . Emphysema   . Hemorrhoids   . ICD (implantable cardiac defibrillator) in place    st jude  . Ischemic cardiomyopathy   . Macular degeneration   . MI, old 2010 or 2011  . Prostatic hypertrophy    hx of  . Retention of urine, unspecified   . Unspecified disorder resulting from impaired renal function     Past Surgical History:  Procedure Laterality Date  . CARDIAC DEFIBRILLATOR PLACEMENT  2011   st jude  . CATARACT EXTRACTION    . PACEMAKER INSERTION  2011  . pci     4/11  . TONSILLECTOMY  85 yo    Current Medications: Outpatient Medications Prior to Visit  Medication Sig Dispense Refill  . albuterol (PROAIR HFA) 108 (90 Base) MCG/ACT  inhaler Inhale 2 puffs into the lungs every 4 (four) hours as needed for wheezing or shortness of breath. 1 Inhaler 3  . albuterol (PROVENTIL) (2.5 MG/3ML) 0.083% nebulizer solution Take 3 mLs (2.5 mg total) every 2 (two) hours as needed by nebulization for wheezing or shortness of breath. 75 mL 0  . ALPRAZolam (XANAX) 0.25 MG tablet Take 1 tablet (0.25 mg total) by mouth at bedtime as needed for sleep. 30 tablet 0  . arformoterol (BROVANA) 15 MCG/2ML NEBU Take 15 mcg by nebulization 2 (two) times daily.    Marland Kitchen aspirin EC 81 MG tablet Take 1 tablet (81 mg total) by mouth at bedtime. 30 tablet 0  . bisacodyl (DULCOLAX) 5 MG EC tablet Take 5 mg by mouth daily as needed for moderate constipation.    . budesonide (PULMICORT) 0.5 MG/2ML nebulizer solution Take 2  mLs (0.5 mg total) by nebulization 2 (two) times daily. 120 mL 3  . ferrous sulfate 325 (65 FE) MG tablet Take 1 tablet (325 mg total) by mouth 2 (two) times daily with a meal. 60 tablet 0  . fluticasone (FLONASE) 50 MCG/ACT nasal spray Place into both nostrils as directed.    . furosemide (LASIX) 20 MG tablet Take 1 tablet (20 mg total) by mouth daily as needed for edema. 90 tablet 3  . guaiFENesin (MUCINEX) 600 MG 12 hr tablet Take 600 mg by mouth 2 (two) times daily as needed for cough or to loosen phlegm.     . lidocaine-prilocaine (EMLA) cream Apply 1 application topically as needed.    . loratadine (CLARITIN) 10 MG tablet Take 1 tablet (10 mg total) by mouth daily. 30 tablet 1  . metoprolol tartrate (LOPRESSOR) 25 MG tablet Take 12.5 mg by mouth 2 (two) times daily. Reported on 06/06/2015    . nitroGLYCERIN (NITROSTAT) 0.4 MG SL tablet Place 1 tablet (0.4 mg total) under the tongue every 5 (five) minutes as needed for chest pain. UP TO 3 DOSES THEN CALL EMS 30 tablet 1  . OXYGEN Inhale 2 L into the lungs at bedtime.     . pantoprazole (PROTONIX) 40 MG tablet Take 1 tablet (40 mg total) by mouth daily. 30 tablet 0  . rosuvastatin (CRESTOR) 5 MG tablet Take 1 tablet (5 mg total) by mouth daily. 90 tablet 3  . sodium chloride (OCEAN) 0.65 % SOLN nasal spray Place 2 sprays into both nostrils 2 (two) times daily.  0  . tobramycin (TOBREX) 0.3 % ophthalmic solution     . trimethoprim (TRIMPEX) 100 MG tablet Take 1 tablet (100 mg total) daily by mouth.    . vitamin B-12 1000 MCG tablet Take 1 tablet (1,000 mcg total) daily by mouth.    . vitamin C (VITAMIN C) 250 MG tablet Take 1 tablet (250 mg total) by mouth 2 (two) times daily. 60 tablet 0   Facility-Administered Medications Prior to Visit  Medication Dose Route Frequency Provider Last Rate Last Dose  . 0.9 %  sodium chloride infusion  250 mL Intravenous Once Ladell Pier, MD      . epoetin alfa (EPOGEN,PROCRIT) injection 60,000 Units   60,000 Units Subcutaneous Once Owens Shark, NP         Allergies:   Patient has no known allergies.   Social History   Socioeconomic History  . Marital status: Widowed    Spouse name: Not on file  . Number of children: Not on file  . Years of education: Not on file  .  Highest education level: Not on file  Occupational History  . 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 Financial resource strain: Not on file  . Food insecurity:    Worry: Not on file    Inability: Not on file  . Transportation needs:    Medical: Not on file    Non-medical: Not on file  Tobacco Use  . Smoking status: Former Smoker    Types: Cigarettes    Last attempt to quit: 04/27/2009    Years since quitting: 8.9  . Smokeless tobacco: Never Used  Substance and Sexual Activity  . Alcohol use: Yes    Comment: occasional beer  . Drug use: No  . Sexual activity: Never  Lifestyle  . Physical activity:    Days per week: Not on file    Minutes per session: Not on file  . Stress: Not on file  Relationships  . Social connections:    Talks on phone: Not on file    Gets together: Not on file    Attends religious service: Not on file    Active member of club or organization: Not on file    Attends meetings of clubs or organizations: Not on file    Relationship status: Not on file  Other Topics Concern  . Not on file  Social History Narrative   Lives in a house with stairs, which he needs to use, with his wife.  Wife is not able-bodied.  Does not use a cane or walker, but is unsteady on his feet.        [name]:  [phone] cell, [phone], daughter, POA.     Boyd Kerbs:  [phone], nephew   Juluis Pitch:  [phone], daughter           Family History:  The patient's family history includes Atrial fibrillation in his daughter; Breast cancer in his sister; Congestive Heart Failure in his mother; Coronary artery disease in his mother; Heart attack in his  brother; Lung cancer in his father; Stroke in his mother.   ROS:   Please see the history of present illness.    ROS All other systems reviewed and are negative.   PHYSICAL EXAM:   VS:  BP 138/80   Pulse 65   Ht 6' (1.829 m)   Wt 68.4 kg   BMI 20.45 kg/m    Affect appropriate Elderly [place] male  HEENT: normal Neck supple with no adenopathy JVP normal no bruits no thyromegaly Lungs clear with no wheezing and good diaphragmatic motion Upper airway mechanical stridor chronic Heart:  S1/S2 no murmur, no rub, gallop or click AICD under left clavicle  PMI normal Abdomen: benighn, BS positve, no tenderness, no AAA no bruit.  No HSM or HJR Distal pulses intact with no bruits Plus one edema with varicosities Neuro non-focal Skin warm and dry No muscular weakness   Wt Readings from Last 3 Encounters:  03/29/18 68.4 kg  03/01/18 68.1 kg  01/26/18 68.4 kg      Studies/Labs Reviewed:   EKG:   10/151/8 HR 77  07/09/16  SR rate 63 normal   Recent Labs: 12/02/2017: NT-Pro BNP 1,328 03/15/2018: ALT 9; BUN 25; Creatinine 1.09; Hemoglobin 8.6; Platelet Count 118; Potassium 4.3; Sodium 144   Lipid Panel No results found for: CHOL, TRIG, HDL, CHOLHDL, VLDL, LDLCALC, LDLDIRECT  Additional studies/ records that were reviewed today include:  LHC in 2011: PCI/DES to RI, dLCx and RCA.   2D  ECHO: 11/15/2013 LV EF: 45% -  50% Study Conclusions - Left ventricle: Distal septal and apical hypokinesis. The cavity size was mildly dilated. Systolic function was mildly reduced. The estimated ejection fraction was in the range of 45% to 50%. - Aortic valve: Small systolic gradient without significant stenosis Valve area (VTI): 1.8 cm^2. Valve area (Vmax): 1.72 cm^2. - Mitral valve: Calcified annulus. Mildly thickened leaflets . - Left atrium: The atrium was mildly dilated. - Atrial septum: No defect or patent foramen ovale was identified. - Pulmonary arteries: PA peak pressure: 40  mm Hg (S).  ASSESSMENT:    1. Dyspnea, unspecified type   2. Coronary artery disease involving native coronary artery of native heart without angina pectoris   3. Hyperlipidemia, unspecified hyperlipidemia type      PLAN:  In order of problems listed above:  Dyspnea:  Improved CHF euvolemic continue current dose diuretic His impedence drops don't Always correlate with elevated BNP. Told him to take lasix when he is dyspnic and transmit Reading from CRT/AICD.  ICM monitoring normal last month   CAD s/p DES to RI, dLCX and RCA (2011):  Asa held . Continue BB and statin.   H/O VF cardiac arrest: s/p St. Jude ICd followed by Dr. Rayann Heman.  HLD: continue statin.   COPD: stable. followed by Dr Gwenette Greet at [facility] suggested 6 minute walk test to qualify for oxygen  Myeloma:  Plavix stopped due to bleeding risk  IgA and light chains elevated Declined Rx with daratumamab and port a cath    Daughter [name] who is primary care giver with patient and updated on above    Jenkins Rouge

## 2018-03-29 ENCOUNTER — Ambulatory Visit (INDEPENDENT_AMBULATORY_CARE_PROVIDER_SITE_OTHER): Payer: Medicare Other

## 2018-03-29 ENCOUNTER — Ambulatory Visit (INDEPENDENT_AMBULATORY_CARE_PROVIDER_SITE_OTHER): Payer: Medicare Other | Admitting: Cardiovascular Disease

## 2018-03-29 ENCOUNTER — Encounter: Payer: Self-pay | Admitting: Cardiovascular Disease

## 2018-03-29 VITALS — BP 138/80 | HR 65 | Ht 72.0 in | Wt 150.8 lb

## 2018-03-29 DIAGNOSIS — I5032 Chronic diastolic (congestive) heart failure: Secondary | ICD-10-CM

## 2018-03-29 DIAGNOSIS — R0600 Dyspnea, unspecified: Secondary | ICD-10-CM

## 2018-03-29 DIAGNOSIS — I255 Ischemic cardiomyopathy: Secondary | ICD-10-CM

## 2018-03-29 DIAGNOSIS — I2510 Atherosclerotic heart disease of native coronary artery without angina pectoris: Secondary | ICD-10-CM | POA: Diagnosis not present

## 2018-03-29 DIAGNOSIS — E785 Hyperlipidemia, unspecified: Secondary | ICD-10-CM

## 2018-03-29 DIAGNOSIS — Z95810 Presence of automatic (implantable) cardiac defibrillator: Secondary | ICD-10-CM | POA: Diagnosis not present

## 2018-03-29 NOTE — Patient Instructions (Signed)

## 2018-03-29 NOTE — Progress Notes (Signed)
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  Thoracic impedance normal but was abnormal from 03/14/2018 - 03/24/2018 suggesting fluid accumulation.   Prescribed:Furosemide 20 mgTake 1 tablet (20 mg total) by mouth daily as needed for edema  Labs: 03/15/2018 Creatinine 1.09, BUN 25, Potassium 4.3, Sodium 144, GFR 59->60 02/01/2018 Creatinine 1.04, BUN 28, Potassium 3.9, Sodium 146, GFR >60 12/16/2017 Creatinine 1.12, BUN 34, Potassium 4.3, Soidum 144, GFR 56->60 12/02/2017 Creatinine 1.02, BUN 30, Potassium 4.4, Sodium 146, GFR 64-74 11/06/2017 Creatinine 1.10, BUN 25, Potassium 3.8, Sodium 143, GFR 57->60 10/07/2017 Creatinine1.09, BUN34, Potassium4.3, [id number], GFR58->60 09/09/2017 Creatinine1.18, BUN34, Potassium4.0, [id number], GFR52->60  06/15/2017 Creatinine1.30, BUN33, Potassium3.5, [id number], GFR47-54  06/01/2017 Creatinine1.29, BUN27, Potassium4.6, [id number], BOF75-10  04/27/2017 Creatinine1.14, BUN24, Potassium3.9, [id number], GFR55->60 A complete set of results can be found in Results Review.  Recommendations:  No changes. Encouraged to call for fluid symptoms.  Follow-up plan: ICM clinic phone appointment on4/06/2018.   Copy of ICM check sent to Dr.Allred and [name] since patient has an appt with him today.   3 month ICM trend: 03/29/2018    1 Year ICM trend:       Rosalene Billings, RN 03/29/2018 8:55 AM

## 2018-03-31 ENCOUNTER — Inpatient Hospital Stay: Payer: Medicare Other | Attending: Oncology

## 2018-03-31 ENCOUNTER — Inpatient Hospital Stay: Payer: Medicare Other

## 2018-03-31 ENCOUNTER — Telehealth: Payer: Self-pay | Admitting: *Deleted

## 2018-03-31 VITALS — BP 104/81 | HR 68

## 2018-03-31 DIAGNOSIS — N189 Chronic kidney disease, unspecified: Secondary | ICD-10-CM | POA: Diagnosis not present

## 2018-03-31 DIAGNOSIS — D631 Anemia in chronic kidney disease: Secondary | ICD-10-CM | POA: Insufficient documentation

## 2018-03-31 DIAGNOSIS — C9000 Multiple myeloma not having achieved remission: Secondary | ICD-10-CM | POA: Insufficient documentation

## 2018-03-31 DIAGNOSIS — E538 Deficiency of other specified B group vitamins: Secondary | ICD-10-CM

## 2018-03-31 LAB — CBC WITH DIFFERENTIAL (CANCER CENTER ONLY)
Abs Immature Granulocytes: 0 10*3/uL (ref 0.00–0.07)
Basophils Absolute: 0 10*3/uL (ref 0.0–0.1)
Basophils Relative: 0 %
Eosinophils Absolute: 0 10*3/uL (ref 0.0–0.5)
Eosinophils Relative: 1 %
HCT: 26.9 % — ABNORMAL LOW (ref 39.0–52.0)
Hemoglobin: 8.1 g/dL — ABNORMAL LOW (ref 13.0–17.0)
Immature Granulocytes: 0 %
Lymphocytes Relative: 24 %
Lymphs Abs: 0.8 10*3/uL (ref 0.7–4.0)
MCH: 22.9 pg — ABNORMAL LOW (ref 26.0–34.0)
MCHC: 30.1 g/dL (ref 30.0–36.0)
MCV: 76.2 fL — ABNORMAL LOW (ref 80.0–100.0)
MONO ABS: 0.1 10*3/uL (ref 0.1–1.0)
Monocytes Relative: 4 %
Neutro Abs: 2.2 10*3/uL (ref 1.7–7.7)
Neutrophils Relative %: 71 %
Platelet Count: 100 10*3/uL — ABNORMAL LOW (ref 150–400)
RBC: 3.53 MIL/uL — ABNORMAL LOW (ref 4.22–5.81)
RDW: 22.1 % — AB (ref 11.5–15.5)
WBC Count: 3.2 10*3/uL — ABNORMAL LOW (ref 4.0–10.5)
nRBC: 0.6 % — ABNORMAL HIGH (ref 0.0–0.2)

## 2018-03-31 MED ORDER — EPOETIN ALFA 20000 UNIT/ML IJ SOLN
INTRAMUSCULAR | Status: AC
  Filled 2018-03-31: qty 1

## 2018-03-31 MED ORDER — EPOETIN ALFA 40000 UNIT/ML IJ SOLN
INTRAMUSCULAR | Status: AC
  Filled 2018-03-31: qty 1

## 2018-03-31 MED ORDER — EPOETIN ALFA 20000 UNIT/ML IJ SOLN
60000.0000 [IU] | Freq: Once | INTRAMUSCULAR | Status: AC
  Administered 2018-03-31: 60000 [IU] via SUBCUTANEOUS

## 2018-03-31 NOTE — Telephone Encounter (Signed)
Provided his daughter with CBC results and to inquire if he is symptomatic for need for transfusion. Listed symptoms of shortness of breath, weakness, fast pulse, dizzy or lightheaded. She will ask him and call back tomorrow.

## 2018-04-01 ENCOUNTER — Telehealth: Payer: Self-pay | Admitting: *Deleted

## 2018-04-01 ENCOUNTER — Other Ambulatory Visit: Payer: Self-pay | Admitting: *Deleted

## 2018-04-01 ENCOUNTER — Telehealth: Payer: Self-pay | Admitting: Oncology

## 2018-04-01 DIAGNOSIS — C9000 Multiple myeloma not having achieved remission: Secondary | ICD-10-CM

## 2018-04-01 DIAGNOSIS — D649 Anemia, unspecified: Secondary | ICD-10-CM

## 2018-04-01 NOTE — Telephone Encounter (Signed)
Scheduled appt per 3/5 sch message - patient daughter is aware of appts.

## 2018-04-01 NOTE — Telephone Encounter (Signed)
Left VM that patient was symptomatic and is requesting transfusion. High priority scheduling message sent for lab/transfusion this week and to call daughter.

## 2018-04-02 ENCOUNTER — Other Ambulatory Visit: Payer: Self-pay | Admitting: *Deleted

## 2018-04-02 ENCOUNTER — Inpatient Hospital Stay: Payer: Medicare Other

## 2018-04-02 DIAGNOSIS — C9000 Multiple myeloma not having achieved remission: Secondary | ICD-10-CM

## 2018-04-02 DIAGNOSIS — D631 Anemia in chronic kidney disease: Secondary | ICD-10-CM | POA: Diagnosis not present

## 2018-04-02 DIAGNOSIS — D649 Anemia, unspecified: Secondary | ICD-10-CM

## 2018-04-02 DIAGNOSIS — N189 Chronic kidney disease, unspecified: Secondary | ICD-10-CM | POA: Diagnosis not present

## 2018-04-02 LAB — PREPARE RBC (CROSSMATCH)

## 2018-04-02 NOTE — Progress Notes (Signed)
Released order to prepare blood and called blood bank to inform them he will receive his transfusion tomorrow.

## 2018-04-03 ENCOUNTER — Inpatient Hospital Stay: Payer: Medicare Other

## 2018-04-03 DIAGNOSIS — C9000 Multiple myeloma not having achieved remission: Secondary | ICD-10-CM | POA: Diagnosis not present

## 2018-04-03 DIAGNOSIS — N189 Chronic kidney disease, unspecified: Secondary | ICD-10-CM | POA: Diagnosis not present

## 2018-04-03 DIAGNOSIS — D649 Anemia, unspecified: Secondary | ICD-10-CM

## 2018-04-03 DIAGNOSIS — D631 Anemia in chronic kidney disease: Secondary | ICD-10-CM | POA: Diagnosis not present

## 2018-04-03 MED ORDER — ACETAMINOPHEN 325 MG PO TABS
650.0000 mg | ORAL_TABLET | Freq: Once | ORAL | Status: AC
  Administered 2018-04-03: 650 mg via ORAL

## 2018-04-03 MED ORDER — ACETAMINOPHEN 325 MG PO TABS
ORAL_TABLET | ORAL | Status: AC
  Filled 2018-04-03: qty 2

## 2018-04-03 MED ORDER — SODIUM CHLORIDE 0.9% IV SOLUTION
250.0000 mL | Freq: Once | INTRAVENOUS | Status: AC
  Administered 2018-04-03: 250 mL via INTRAVENOUS
  Filled 2018-04-03: qty 250

## 2018-04-03 NOTE — Patient Instructions (Signed)
Blood Transfusion, Adult, Care After This sheet gives you information about how to care for yourself after your procedure. Your doctor may also give you more specific instructions. If you have problems or questions, contact your doctor. Follow these instructions at home:   Take over-the-counter and prescription medicines only as told by your doctor.  Go back to your normal activities as told by your doctor.  Follow instructions from your doctor about how to take care of the area where an IV tube was put into your vein (insertion site). Make sure you: ? Wash your hands with soap and water before you change your bandage (dressing). If there is no soap and water, use hand sanitizer. ? Change your bandage as told by your doctor.  Check your IV insertion site every day for signs of infection. Check for: ? More redness, swelling, or pain. ? More fluid or blood. ? Warmth. ? Pus or a bad smell. Contact a doctor if:  You have more redness, swelling, or pain around the IV insertion site.  You have more fluid or blood coming from the IV insertion site.  Your IV insertion site feels warm to the touch.  You have pus or a bad smell coming from the IV insertion site.  Your pee (urine) turns pink, red, or brown.  You feel weak after doing your normal activities. Get help right away if:  You have signs of a serious allergic or body defense (immune) system reaction, including: ? Itchiness. ? Hives. ? Trouble breathing. ? Anxiety. ? Pain in your chest or lower back. ? Fever, flushing, and chills. ? Fast pulse. ? Rash. ? Watery poop (diarrhea). ? Throwing up (vomiting). ? Dark pee. ? Serious headache. ? Dizziness. ? Stiff neck. ? Yellow color in your face or the white parts of your eyes (jaundice). Summary  After a blood transfusion, return to your normal activities as told by your doctor.  Every day, check for signs of infection where the IV tube was put into your vein.  Some  signs of infection are warm skin, more redness and pain, more fluid or blood, and pus or a bad smell where the needle went in.  Contact your doctor if you feel weak or have any unusual symptoms. This information is not intended to replace advice given to you by your health care provider. Make sure you discuss any questions you have with your health care provider. Document Released: 02/03/2014 Document Revised: 09/07/2015 Document Reviewed: 09/07/2015 Elsevier Interactive Patient Education  2019 Elsevier Inc.  

## 2018-04-04 LAB — BPAM RBC
Blood Product Expiration Date: 202004082359
Blood Product Expiration Date: 202004122359
ISSUE DATE / TIME: 202003070832
ISSUE DATE / TIME: 202003070832
Unit Type and Rh: 5100
Unit Type and Rh: 6200

## 2018-04-04 LAB — TYPE AND SCREEN
ABO/RH(D): A POS
ANTIBODY SCREEN: NEGATIVE
Unit division: 0
Unit division: 0

## 2018-04-12 ENCOUNTER — Inpatient Hospital Stay (HOSPITAL_BASED_OUTPATIENT_CLINIC_OR_DEPARTMENT_OTHER): Payer: Medicare Other | Admitting: Oncology

## 2018-04-12 ENCOUNTER — Telehealth: Payer: Self-pay | Admitting: Oncology

## 2018-04-12 ENCOUNTER — Inpatient Hospital Stay: Payer: Medicare Other

## 2018-04-12 ENCOUNTER — Other Ambulatory Visit: Payer: Self-pay

## 2018-04-12 VITALS — BP 143/68 | HR 62 | Temp 97.9°F | Resp 18 | Ht 72.0 in | Wt 149.3 lb

## 2018-04-12 DIAGNOSIS — C9000 Multiple myeloma not having achieved remission: Secondary | ICD-10-CM

## 2018-04-12 DIAGNOSIS — N189 Chronic kidney disease, unspecified: Secondary | ICD-10-CM | POA: Diagnosis not present

## 2018-04-12 DIAGNOSIS — E538 Deficiency of other specified B group vitamins: Secondary | ICD-10-CM

## 2018-04-12 DIAGNOSIS — D631 Anemia in chronic kidney disease: Secondary | ICD-10-CM | POA: Diagnosis not present

## 2018-04-12 LAB — CBC WITH DIFFERENTIAL (CANCER CENTER ONLY)
Abs Immature Granulocytes: 0.01 10*3/uL (ref 0.00–0.07)
BASOS PCT: 0 %
Basophils Absolute: 0 10*3/uL (ref 0.0–0.1)
Eosinophils Absolute: 0.1 10*3/uL (ref 0.0–0.5)
Eosinophils Relative: 2 %
HCT: 34.5 % — ABNORMAL LOW (ref 39.0–52.0)
Hemoglobin: 10.7 g/dL — ABNORMAL LOW (ref 13.0–17.0)
Immature Granulocytes: 0 %
Lymphocytes Relative: 31 %
Lymphs Abs: 1 10*3/uL (ref 0.7–4.0)
MCH: 24.3 pg — ABNORMAL LOW (ref 26.0–34.0)
MCHC: 31 g/dL (ref 30.0–36.0)
MCV: 78.4 fL — ABNORMAL LOW (ref 80.0–100.0)
Monocytes Absolute: 0.2 10*3/uL (ref 0.1–1.0)
Monocytes Relative: 5 %
NRBC: 0 % (ref 0.0–0.2)
Neutro Abs: 2 10*3/uL (ref 1.7–7.7)
Neutrophils Relative %: 62 %
PLATELETS: 80 10*3/uL — AB (ref 150–400)
RBC: 4.4 MIL/uL (ref 4.22–5.81)
RDW: 21.7 % — ABNORMAL HIGH (ref 11.5–15.5)
WBC Count: 3.2 10*3/uL — ABNORMAL LOW (ref 4.0–10.5)

## 2018-04-12 MED ORDER — EPOETIN ALFA 20000 UNIT/ML IJ SOLN
INTRAMUSCULAR | Status: AC
  Filled 2018-04-12: qty 1

## 2018-04-12 MED ORDER — EPOETIN ALFA 40000 UNIT/ML IJ SOLN
INTRAMUSCULAR | Status: AC
  Filled 2018-04-12: qty 1

## 2018-04-12 MED ORDER — EPOETIN ALFA 20000 UNIT/ML IJ SOLN
60000.0000 [IU] | Freq: Once | INTRAMUSCULAR | Status: AC
  Administered 2018-04-12: 60000 [IU] via SUBCUTANEOUS

## 2018-04-12 NOTE — Telephone Encounter (Signed)
Scheduled appt per 3/16 los. ° °Printed calendar and avs. °

## 2018-04-12 NOTE — Progress Notes (Signed)
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  He was transfused 2 units of red cells 04/03/2018 after the hemoglobin returned at 8.1 on 03/31/2018.  He feels well, but he has developed "sores "at the buttocks.  Objective:  Vital signs in last 24 hours:  Blood pressure (!) 143/68, pulse 62, temperature 97.9 F (36.6 C), temperature source Oral, resp. rate 18, height 6' (1.829 m), weight 149 lb 4.8 oz (67.7 kg), SpO2 98 %.   Resp: Distant breath sounds, no respiratory distress Cardio: Regular rate and rhythm Vascular: No leg edema  Skin: Superficial decubitus ulcers at the ischio area bilaterally   Lab Results:  Lab Results  Component Value Date   WBC 3.2 (L) 04/12/2018   HGB 10.7 (L) 04/12/2018   HCT 34.5 (L) 04/12/2018   MCV 78.4 (L) 04/12/2018   PLT 80 (L) 04/12/2018   NEUTROABS 2.0 04/12/2018    CMP  Lab Results  Component Value Date   NA 144 03/15/2018   K 4.3 03/15/2018   CL 109 03/15/2018   CO2 25 03/15/2018   GLUCOSE 104 (H) 03/15/2018   BUN 25 (H) 03/15/2018   CREATININE 1.09 03/15/2018   CALCIUM 8.3 (L) 03/15/2018   PROT 6.8 03/15/2018   ALBUMIN 3.4 (L) 03/15/2018   AST 13 (L) 03/15/2018   ALT 9 03/15/2018   ALKPHOS 51 03/15/2018   BILITOT 0.4 03/15/2018   GFRNONAA 59 (L) 03/15/2018   GFRAA >60 03/15/2018     Medications: I have reviewed the patient's current medications.   Assessment/Plan: 1. Pancytopenia-transfused with packed red blood cells 11/28/2016, 12/27/2016, 01/13/2017, 02/02/2017 2. History of beta thalassemia trait 3. History of vitamin B-12 deficiency-vitamin B-12 replacement initiated 11/11/2016 4. History of a serum monoclonal IgAkappaprotein-elevated serum free kappa light chains, IgA, and M spike10/16/2018  Bone marrow biopsy 12/03/2016-consistent with multiple myeloma  Bone survey 12/06/2016-no destructive or lytic bone lesions identified.   Revlimid 21 days on/7 days off and weekly dexamethasone 12/16/2016(Decadron discontinued 12/29/2016 secondary to altered mental status)  Cycle 2 single agent Revlimid 01/14/2017(discontinued 3 days early)  Serum light chains, IgAimproved 02/02/2017  Cycle3 single agent Revlimid beginning 02/16/2017  Cycle4single agent Revlimid beginning 03/16/2017  Cycle 5 single agent Revlimid beginning 04/14/2017(Revlimid placed on hold beginning 04/28/2017 due to thrombocytopenia)  Cycle 6 single agent Revlimid beginning 05/23/2017 x 14 days 5. COPD with acute and chronic respiratory failure/hypoxemia 6. History of coronary artery disease 7. Ischemic cardiomyopathy 8. ICD in place 9. Prostatic hypertrophy 10.Inappropriately low erythropoietin level 11/28/2016 11. Severe pancytopenia secondary to multiple myeloma and Revlimid-improved 12. Fever-likely related to an upper respiratory infection, RSV positive swab 02/02/2017 13.Anemia secondary to renal insufficiency-trial of weekly erythropoietin initiated 06/17/2017.Erythropoietin dose increased to 60,000 units weekly beginning 07/28/2017.  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  He continues to require intermittent red cell transfusion support.  He continues every 2-week erythropoietin therapy.  The IgA level and serum light chains are rising.  I discussed treatment options with [name] and and his daughter.  The plan is to initiate melphalan/prednisone therapy if he requires red cell transfusion support again.   He will return for a lab visit and erythropoietin in 2 weeks.  He will return for an office visit in 4 weeks.  We recommended he sit in a different chair and use a cushion to relieve the pressure on his buttocks.  [name], MD  04/12/2018  3:51 PM

## 2018-04-12 NOTE — Patient Instructions (Signed)
Epoetin Alfa injection °What is this medicine? °EPOETIN ALFA (e POE e tin AL fa) helps your body make more red blood cells. This medicine is used to treat anemia caused by chronic kidney disease, cancer chemotherapy, or HIV-therapy. It may also be used before surgery if you have anemia. °This medicine may be used for other purposes; ask your health care provider or pharmacist if you have questions. °COMMON BRAND NAME(S): Epogen, Procrit, Retacrit °What should I tell my health care provider before I take this medicine? °They need to know if you have any of these conditions: °-cancer °-heart disease °-high blood pressure °-history of blood clots °-history of stroke °-low levels of folate, iron, or vitamin B12 in the blood °-seizures °-an unusual or allergic reaction to erythropoietin, albumin, benzyl alcohol, hamster proteins, other medicines, foods, dyes, or preservatives °-pregnant or trying to get pregnant °-breast-feeding °How should I use this medicine? °This medicine is for injection into a vein or under the skin. It is usually given by a health care professional in a hospital or clinic setting. °If you get this medicine at home, you will be taught how to prepare and give this medicine. Use exactly as directed. Take your medicine at regular intervals. Do not take your medicine more often than directed. °It is important that you put your used needles and syringes in a special sharps container. Do not put them in a trash can. If you do not have a sharps container, call your pharmacist or healthcare provider to get one. °A special MedGuide will be given to you by the pharmacist with each prescription and refill. Be sure to read this information carefully each time. °Talk to your pediatrician regarding the use of this medicine in children. While this drug may be prescribed for selected conditions, precautions do apply. °Overdosage: If you think you have taken too much of this medicine contact a poison control center  or emergency room at once. °NOTE: This medicine is only for you. Do not share this medicine with others. °What if I miss a dose? °If you miss a dose, take it as soon as you can. If it is almost time for your next dose, take only that dose. Do not take double or extra doses. °What may interact with this medicine? °Interactions have not been studied. °This list may not describe all possible interactions. Give your health care provider a list of all the medicines, herbs, non-prescription drugs, or dietary supplements you use. Also tell them if you smoke, drink alcohol, or use illegal drugs. Some items may interact with your medicine. °What should I watch for while using this medicine? °Your condition will be monitored carefully while you are receiving this medicine. °You may need blood work done while you are taking this medicine. °This medicine may cause a decrease in vitamin B6. You should make sure that you get enough vitamin B6 while you are taking this medicine. Discuss the foods you eat and the vitamins you take with your health care professional. °What side effects may I notice from receiving this medicine? °Side effects that you should report to your doctor or health care professional as soon as possible: °-allergic reactions like skin rash, itching or hives, swelling of the face, lips, or tongue °-seizures °-signs and symptoms of a blood clot such as breathing problems; changes in vision; chest pain; severe, sudden headache; pain, swelling, warmth in the leg; trouble speaking; sudden numbness or weakness of the face, arm or leg °-signs and symptoms of a stroke   like changes in vision; confusion; trouble speaking or understanding; severe headaches; sudden numbness or weakness of the face, arm or leg; trouble walking; dizziness; loss of balance or coordination °Side effects that usually do not require medical attention (report to your doctor or health care professional if they continue or are  bothersome): °-chills °-cough °-dizziness °-fever °-headaches °-joint pain °-muscle cramps °-muscle pain °-nausea, vomiting °-pain, redness, or irritation at site where injected °This list may not describe all possible side effects. Call your doctor for medical advice about side effects. You may report side effects to FDA at 1-800-FDA-1088. °Where should I keep my medicine? °Keep out of the reach of children. °Store in a refrigerator between 2 and 8 degrees C (36 and 46 degrees F). Do not freeze or shake. Throw away any unused portion if using a single-dose vial. Multi-dose vials can be kept in the refrigerator for up to 21 days after the initial dose. Throw away unused medicine. °NOTE: This sheet is a summary. It may not cover all possible information. If you have questions about this medicine, talk to your doctor, pharmacist, or health care provider. °© 2019 Elsevier/Gold Standard (2016-08-22 08:35:19) ° °

## 2018-04-23 ENCOUNTER — Telehealth: Payer: Self-pay | Admitting: *Deleted

## 2018-04-23 NOTE — Telephone Encounter (Signed)
Spoke with daughter stephanie. Per dr [name], no need to keep appt for 04/26/2018. Will r/s for a later date.

## 2018-04-26 ENCOUNTER — Inpatient Hospital Stay: Payer: Medicare Other

## 2018-05-03 ENCOUNTER — Other Ambulatory Visit: Payer: Self-pay

## 2018-05-03 ENCOUNTER — Ambulatory Visit (INDEPENDENT_AMBULATORY_CARE_PROVIDER_SITE_OTHER): Payer: Medicare Other

## 2018-05-03 DIAGNOSIS — I5032 Chronic diastolic (congestive) heart failure: Secondary | ICD-10-CM | POA: Diagnosis not present

## 2018-05-03 DIAGNOSIS — Z95810 Presence of automatic (implantable) cardiac defibrillator: Secondary | ICD-10-CM

## 2018-05-03 NOTE — Progress Notes (Signed)
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  He has not complained of any fluid symptoms.  Thoracic impedance slightly below baseline normal suggesting fluid accumulation starting 05/02/2018.   Prescribed:Furosemide 20 mgTake 1 tablet (20 mg total) by mouth daily as needed for edema  Labs: 03/15/2018 Creatinine 1.09, BUN 25, Potassium 4.3, Sodium 144, GFR 59->60 02/01/2018 Creatinine 1.04, BUN 28, Potassium 3.9, Sodium 146, GFR >60 12/16/2017 Creatinine 1.12, BUN 34, Potassium 4.3, Soidum 144, GFR 56->60 12/02/2017 Creatinine 1.02, BUN 30, Potassium 4.4, Sodium 146, GFR 64-74 11/06/2017 Creatinine 1.10, BUN 25, Potassium 3.8, Sodium 143, GFR 57->60 10/07/2017 Creatinine1.09, BUN34, Potassium4.3, [id number], GFR58->60 09/09/2017 Creatinine1.18, BUN34, Potassium4.0, [id number], GFR52->60  06/15/2017 Creatinine1.30, BUN33, Potassium3.5, [id number], GFR47-54  06/01/2017 Creatinine1.29, BUN27, Potassium4.6, [id number], EMV36-12  04/27/2017 Creatinine1.14, BUN24, Potassium3.9, [id number], GFR55->60 A complete set of results can be found in Results Review.  Recommendations: Suggested patient take 1 tablet of PRN Furosemide x 1 day.  Patient self caths several times a day and daughter said he may not take the Furosemide.  Advised that is fine if he does not want to take a PRN dosage and encouraged to limit salt intake.  Advised to call if he has any fluid symptoms.  Follow-up plan: ICM clinic phone appointment on4/14/2020 to recheck fluid levels.    Copy of ICM check sent to Dr.Allred.   3 month ICM trend: 05/03/2018    1 Year ICM trend:       Rosalene Billings, RN 05/03/2018 10:30 AM

## 2018-05-04 DIAGNOSIS — M542 Cervicalgia: Secondary | ICD-10-CM | POA: Diagnosis not present

## 2018-05-05 ENCOUNTER — Telehealth: Payer: Self-pay | Admitting: *Deleted

## 2018-05-05 DIAGNOSIS — R300 Dysuria: Secondary | ICD-10-CM

## 2018-05-05 NOTE — Telephone Encounter (Signed)
Daughter called to report he has burning with urination. Denies fever or blood in urine. Per Ned Card, NP needs a U/A and culture. Daughter does not want to bring him out, so she will pick up specimen container and have him void tomorrow and bring it back in for testing. Orders placed.

## 2018-05-06 ENCOUNTER — Telehealth: Payer: Self-pay | Admitting: *Deleted

## 2018-05-06 ENCOUNTER — Other Ambulatory Visit: Payer: Self-pay

## 2018-05-06 DIAGNOSIS — R300 Dysuria: Secondary | ICD-10-CM

## 2018-05-06 LAB — URINALYSIS, COMPLETE (UACMP) WITH MICROSCOPIC
Bilirubin Urine: NEGATIVE
Glucose, UA: NEGATIVE mg/dL
Ketones, ur: 5 mg/dL — AB
Nitrite: NEGATIVE
Protein, ur: 100 mg/dL — AB
RBC / HPF: >50 RBC/hpf — ABNORMAL HIGH (ref 0–5)
Specific Gravity, Urine: 1.018 (ref 1.005–1.030)
WBC, UA: >50 WBC/hpf — ABNORMAL HIGH (ref 0–5)
pH: 5 (ref 5.0–8.0)

## 2018-05-06 MED ORDER — CIPROFLOXACIN HCL 500 MG PO TABS: 500.0000 mg | ORAL_TABLET | Freq: Two times a day (BID) | ORAL | 0 refills | Status: DC

## 2018-05-06 NOTE — Telephone Encounter (Addendum)
Provided daughter with results of U/A and positive for UTI. Script sent to [facility] as requested with request to deliver to patient. Daughter is asking if he would be OK to skip the injection this week? Has a lot of concern about him coming in the office. Last Hgb 10.7 on 04/12/18 with a transfusion on 04/03/18.  Last procrit 60,000 on 04/12/18 Per Dr. [name]: OK to cancel appointments on 4/13 if patient wishes. Notified daughter and patient feels OK for now and does not think he needs the injection this week. She will call back next week with update as to how he feels and wants to come in.

## 2018-05-07 ENCOUNTER — Telehealth: Payer: Self-pay | Admitting: *Deleted

## 2018-05-07 NOTE — Telephone Encounter (Signed)
Received call from daughter [name] reporting that pt had contacted her complaining not feeling well.  [name] was not with pt to give further details to nurse.   Called pt and was informed that pt was feeling better at present.  Pt denied fever, denied coughing, stated has shortness of breath as always - nothing new or worse.  Pt had started antibiotics since yesterday for UTI - prescribed by Dr. [name]. Reinforced need to increase po fluid intake - drink cranberry juice as tolerated.  Asked again, pt responded that he was feeling better now. Instructed pt to call office back with any new symptoms.

## 2018-05-08 LAB — URINE CULTURE: Culture: >=100000 — AB

## 2018-05-10 ENCOUNTER — Other Ambulatory Visit: Payer: Medicare Other

## 2018-05-10 ENCOUNTER — Other Ambulatory Visit: Payer: Self-pay

## 2018-05-10 ENCOUNTER — Ambulatory Visit: Payer: Medicare Other | Admitting: Oncology

## 2018-05-10 ENCOUNTER — Inpatient Hospital Stay: Payer: Medicare Other

## 2018-05-10 ENCOUNTER — Telehealth: Payer: Self-pay | Admitting: *Deleted

## 2018-05-10 ENCOUNTER — Ambulatory Visit: Payer: Medicare Other

## 2018-05-10 ENCOUNTER — Inpatient Hospital Stay: Payer: Medicare Other | Attending: Oncology

## 2018-05-10 VITALS — BP 122/66 | HR 78 | Temp 97.8°F | Resp 18

## 2018-05-10 DIAGNOSIS — N189 Chronic kidney disease, unspecified: Secondary | ICD-10-CM | POA: Diagnosis not present

## 2018-05-10 DIAGNOSIS — C9000 Multiple myeloma not having achieved remission: Secondary | ICD-10-CM

## 2018-05-10 DIAGNOSIS — D631 Anemia in chronic kidney disease: Secondary | ICD-10-CM | POA: Diagnosis not present

## 2018-05-10 DIAGNOSIS — E538 Deficiency of other specified B group vitamins: Secondary | ICD-10-CM

## 2018-05-10 LAB — CBC WITH DIFFERENTIAL (CANCER CENTER ONLY)
Abs Immature Granulocytes: 0 10*3/uL (ref 0.00–0.07)
Basophils Absolute: 0 10*3/uL (ref 0.0–0.1)
Basophils Relative: 0 %
Eosinophils Absolute: 0.1 10*3/uL (ref 0.0–0.5)
Eosinophils Relative: 3 %
HCT: 28 % — ABNORMAL LOW (ref 39.0–52.0)
Hemoglobin: 8.6 g/dL — ABNORMAL LOW (ref 13.0–17.0)
Immature Granulocytes: 0 %
Lymphocytes Relative: 29 %
Lymphs Abs: 0.9 10*3/uL (ref 0.7–4.0)
MCH: 23.6 pg — ABNORMAL LOW (ref 26.0–34.0)
MCHC: 30.7 g/dL (ref 30.0–36.0)
MCV: 76.9 fL — ABNORMAL LOW (ref 80.0–100.0)
Monocytes Absolute: 0.2 10*3/uL (ref 0.1–1.0)
Monocytes Relative: 5 %
Neutro Abs: 2.1 10*3/uL (ref 1.7–7.7)
Neutrophils Relative %: 63 %
Platelet Count: 105 10*3/uL — ABNORMAL LOW (ref 150–400)
RBC: 3.64 MIL/uL — ABNORMAL LOW (ref 4.22–5.81)
RDW: 20.4 % — ABNORMAL HIGH (ref 11.5–15.5)
WBC Count: 3.3 10*3/uL — ABNORMAL LOW (ref 4.0–10.5)
nRBC: 0 % (ref 0.0–0.2)

## 2018-05-10 LAB — CMP (CANCER CENTER ONLY)
ALT: 8 U/L (ref 0–44)
AST: 12 U/L — ABNORMAL LOW (ref 15–41)
Albumin: 3.1 g/dL — ABNORMAL LOW (ref 3.5–5.0)
Alkaline Phosphatase: 51 U/L (ref 38–126)
Anion gap: 10 (ref 5–15)
BUN: 28 mg/dL — ABNORMAL HIGH (ref 8–23)
CO2: 28 mmol/L (ref 22–32)
Calcium: 8.3 mg/dL — ABNORMAL LOW (ref 8.9–10.3)
Chloride: 104 mmol/L (ref 98–111)
Creatinine: 1.11 mg/dL (ref 0.61–1.24)
GFR, Est AFR Am: >60 mL/min (ref >60)
GFR, Estimated: 58 mL/min — ABNORMAL LOW (ref >60)
Glucose, Bld: 135 mg/dL — ABNORMAL HIGH (ref 70–99)
Potassium: 4.1 mmol/L (ref 3.5–5.1)
Sodium: 142 mmol/L (ref 135–145)
Total Bilirubin: 0.4 mg/dL (ref 0.3–1.2)
Total Protein: 6.8 g/dL (ref 6.5–8.1)

## 2018-05-10 MED ORDER — EPOETIN ALFA 20000 UNIT/ML IJ SOLN
60000.0000 [IU] | Freq: Once | INTRAMUSCULAR | Status: AC
  Administered 2018-05-10: 60000 [IU] via SUBCUTANEOUS

## 2018-05-10 MED ORDER — EPOETIN ALFA 20000 UNIT/ML IJ SOLN
INTRAMUSCULAR | Status: AC
  Filled 2018-05-10: qty 1

## 2018-05-10 MED ORDER — EPOETIN ALFA 40000 UNIT/ML IJ SOLN
INTRAMUSCULAR | Status: AC
  Filled 2018-05-10: qty 1

## 2018-05-10 NOTE — Telephone Encounter (Signed)
Patient very worried about missing another procrit dose. He wants to come in today later in afternoon when less crowded. Scheduled his lab for 4pm and sent urgent scheduling message for procrit at 4:30pm. Informed her that urine culture sensitivity showed cipro was good treatment for his UTI>

## 2018-05-10 NOTE — Patient Instructions (Signed)
Epoetin Alfa injection °What is this medicine? °EPOETIN ALFA (e POE e tin AL fa) helps your body make more red blood cells. This medicine is used to treat anemia caused by chronic kidney disease, cancer chemotherapy, or HIV-therapy. It may also be used before surgery if you have anemia. °This medicine may be used for other purposes; ask your health care provider or pharmacist if you have questions. °COMMON BRAND NAME(S): Epogen, Procrit, Retacrit °What should I tell my health care provider before I take this medicine? °They need to know if you have any of these conditions: °-cancer °-heart disease °-high blood pressure °-history of blood clots °-history of stroke °-low levels of folate, iron, or vitamin B12 in the blood °-seizures °-an unusual or allergic reaction to erythropoietin, albumin, benzyl alcohol, hamster proteins, other medicines, foods, dyes, or preservatives °-pregnant or trying to get pregnant °-breast-feeding °How should I use this medicine? °This medicine is for injection into a vein or under the skin. It is usually given by a health care professional in a hospital or clinic setting. °If you get this medicine at home, you will be taught how to prepare and give this medicine. Use exactly as directed. Take your medicine at regular intervals. Do not take your medicine more often than directed. °It is important that you put your used needles and syringes in a special sharps container. Do not put them in a trash can. If you do not have a sharps container, call your pharmacist or healthcare provider to get one. °A special MedGuide will be given to you by the pharmacist with each prescription and refill. Be sure to read this information carefully each time. °Talk to your pediatrician regarding the use of this medicine in children. While this drug may be prescribed for selected conditions, precautions do apply. °Overdosage: If you think you have taken too much of this medicine contact a poison control center  or emergency room at once. °NOTE: This medicine is only for you. Do not share this medicine with others. °What if I miss a dose? °If you miss a dose, take it as soon as you can. If it is almost time for your next dose, take only that dose. Do not take double or extra doses. °What may interact with this medicine? °Interactions have not been studied. °This list may not describe all possible interactions. Give your health care provider a list of all the medicines, herbs, non-prescription drugs, or dietary supplements you use. Also tell them if you smoke, drink alcohol, or use illegal drugs. Some items may interact with your medicine. °What should I watch for while using this medicine? °Your condition will be monitored carefully while you are receiving this medicine. °You may need blood work done while you are taking this medicine. °This medicine may cause a decrease in vitamin B6. You should make sure that you get enough vitamin B6 while you are taking this medicine. Discuss the foods you eat and the vitamins you take with your health care professional. °What side effects may I notice from receiving this medicine? °Side effects that you should report to your doctor or health care professional as soon as possible: °-allergic reactions like skin rash, itching or hives, swelling of the face, lips, or tongue °-seizures °-signs and symptoms of a blood clot such as breathing problems; changes in vision; chest pain; severe, sudden headache; pain, swelling, warmth in the leg; trouble speaking; sudden numbness or weakness of the face, arm or leg °-signs and symptoms of a stroke   like changes in vision; confusion; trouble speaking or understanding; severe headaches; sudden numbness or weakness of the face, arm or leg; trouble walking; dizziness; loss of balance or coordination °Side effects that usually do not require medical attention (report to your doctor or health care professional if they continue or are  bothersome): °-chills °-cough °-dizziness °-fever °-headaches °-joint pain °-muscle cramps °-muscle pain °-nausea, vomiting °-pain, redness, or irritation at site where injected °This list may not describe all possible side effects. Call your doctor for medical advice about side effects. You may report side effects to FDA at 1-800-FDA-1088. °Where should I keep my medicine? °Keep out of the reach of children. °Store in a refrigerator between 2 and 8 degrees C (36 and 46 degrees F). Do not freeze or shake. Throw away any unused portion if using a single-dose vial. Multi-dose vials can be kept in the refrigerator for up to 21 days after the initial dose. Throw away unused medicine. °NOTE: This sheet is a summary. It may not cover all possible information. If you have questions about this medicine, talk to your doctor, pharmacist, or health care provider. °© 2019 Elsevier/Gold Standard (2016-08-22 08:35:19) ° °

## 2018-05-11 ENCOUNTER — Ambulatory Visit (INDEPENDENT_AMBULATORY_CARE_PROVIDER_SITE_OTHER): Payer: Medicare Other

## 2018-05-11 DIAGNOSIS — Z95810 Presence of automatic (implantable) cardiac defibrillator: Secondary | ICD-10-CM

## 2018-05-11 DIAGNOSIS — I5032 Chronic diastolic (congestive) heart failure: Secondary | ICD-10-CM

## 2018-05-11 LAB — PROTEIN ELECTROPHORESIS, SERUM
A/G Ratio: 1.2 (ref 0.7–1.7)
Albumin ELP: 3.5 g/dL (ref 2.9–4.4)
Alpha-1-Globulin: 0.3 g/dL (ref 0.0–0.4)
Alpha-2-Globulin: 0.6 g/dL (ref 0.4–1.0)
Beta Globulin: 1.9 g/dL — ABNORMAL HIGH (ref 0.7–1.3)
Gamma Globulin: 0.2 g/dL — ABNORMAL LOW (ref 0.4–1.8)
Globulin, Total: 2.9 g/dL (ref 2.2–3.9)
M-Spike, %: 1 g/dL — ABNORMAL HIGH
Total Protein ELP: 6.4 g/dL (ref 6.0–8.5)

## 2018-05-11 LAB — IGA: IgA: 1482 mg/dL — ABNORMAL HIGH (ref 61–437)

## 2018-05-11 LAB — KAPPA/LAMBDA LIGHT CHAINS
Kappa free light chain: 680.9 mg/L — ABNORMAL HIGH (ref 3.3–19.4)
Kappa, lambda light chain ratio: 92.01 — ABNORMAL HIGH (ref 0.26–1.65)
Lambda free light chains: 7.4 mg/L (ref 5.7–26.3)

## 2018-05-12 ENCOUNTER — Telehealth: Payer: Self-pay

## 2018-05-12 NOTE — Telephone Encounter (Signed)
Remote ICM transmission received.  Attempted call to daughter, [name], regarding ICM remote transmission and left detailed message, per DPR, with next ICM remote transmission date of 05/24/2018 to recheck fluid levels.  Advised to return call for any fluid symptoms or questions.

## 2018-05-12 NOTE — Progress Notes (Signed)
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  Left detailed message per DPR regarding transmission. Transmission reviewed.   Thoracic impedance continues to trend slightly below baseline normalsuggesting fluid accumulation starting 05/02/2018.  Daughter previously reported pt does not like to take PRN Furosemide because he does self catheterizations.   Prescribed:Furosemide 20 mgTake 1 tablet (20 mg total) by mouth daily as needed for edema  Labs: 03/15/2018 Creatinine 1.09, BUN 25, Potassium 4.3, Sodium 144, GFR 59->60 02/01/2018 Creatinine 1.04, BUN 28, Potassium 3.9, Sodium 146, GFR >60 12/16/2017 Creatinine 1.12, BUN 34, Potassium 4.3, Soidum 144, GFR 56->60 12/02/2017 Creatinine 1.02, BUN 30, Potassium 4.4, Sodium 146, GFR 64-74 11/06/2017 Creatinine 1.10, BUN 25, Potassium 3.8, Sodium 143, GFR 57->60 10/07/2017 Creatinine1.09, BUN34, Potassium4.3, [id number], GFR58->60 09/09/2017 Creatinine1.18, BUN34, Potassium4.0, [id number], GFR52->60  06/15/2017 Creatinine1.30, BUN33, Potassium3.5, [id number], GFR47-54  06/01/2017 Creatinine1.29, BUN27, Potassium4.6, [id number], KVQ25-95  04/27/2017 Creatinine1.14, BUN24, Potassium3.9, [id number], GFR55->60 A complete set of results can be found in Results Review.  Recommendations: Left voice mail with ICM number and encouraged to call if experiencing any fluid symptoms.  Follow-up plan: ICM clinic phone appointment on4/27/2020 to recheck fluid levels.    Copy of ICM check sent to Dr.Allred and [name].    3 month ICM trend: 05/11/2018    1 Year ICM trend:       Rosalene Billings, RN 05/12/2018 4:43 PM

## 2018-05-14 ENCOUNTER — Other Ambulatory Visit: Payer: Self-pay | Admitting: *Deleted

## 2018-05-14 NOTE — Patient Outreach (Signed)
[facility]) Care Management  05/14/2018  [name] Azure, MD [birth year] [phone]   Successful call to patient's daughter [name] [name] as she is the contact person for patient and signed designated part release is in [place].  2 HIPAA identifiers verified and explained purpose of call and educated [name] [name] to the [place] [facility] Case Management services.  [name] [name] says her father has a private paid caregiver and he is extremely hard of hearing so he could not participate in [facility] telephone calls. She also says things are going very smoothly at home with her father at present and she doesn't want to bring another person onto her father's care team at this time.  She does agrees to review the [employer] and asked that the information be mailed to both her address and her father's address.  Plan: Will mail successful outreach letter to patient and daughter.   Barrington Ellison RN,CCM,CDE [place] Management Coordinator Office Phone [phone] Office Fax [phone]

## 2018-05-24 ENCOUNTER — Ambulatory Visit (INDEPENDENT_AMBULATORY_CARE_PROVIDER_SITE_OTHER): Payer: Medicare Other

## 2018-05-24 ENCOUNTER — Other Ambulatory Visit: Payer: Self-pay

## 2018-05-24 ENCOUNTER — Ambulatory Visit (INDEPENDENT_AMBULATORY_CARE_PROVIDER_SITE_OTHER): Payer: Medicare Other | Admitting: *Deleted

## 2018-05-24 DIAGNOSIS — I5032 Chronic diastolic (congestive) heart failure: Secondary | ICD-10-CM

## 2018-05-24 DIAGNOSIS — E119 Type 2 diabetes mellitus without complications: Secondary | ICD-10-CM | POA: Diagnosis not present

## 2018-05-24 DIAGNOSIS — J441 Chronic obstructive pulmonary disease with (acute) exacerbation: Secondary | ICD-10-CM | POA: Diagnosis not present

## 2018-05-24 DIAGNOSIS — Z95810 Presence of automatic (implantable) cardiac defibrillator: Secondary | ICD-10-CM

## 2018-05-24 DIAGNOSIS — I10 Essential (primary) hypertension: Secondary | ICD-10-CM | POA: Diagnosis not present

## 2018-05-24 DIAGNOSIS — I252 Old myocardial infarction: Secondary | ICD-10-CM | POA: Diagnosis not present

## 2018-05-24 DIAGNOSIS — M1990 Unspecified osteoarthritis, unspecified site: Secondary | ICD-10-CM | POA: Diagnosis not present

## 2018-05-24 DIAGNOSIS — I2510 Atherosclerotic heart disease of native coronary artery without angina pectoris: Secondary | ICD-10-CM | POA: Diagnosis not present

## 2018-05-24 DIAGNOSIS — N183 Chronic kidney disease, stage 3 (moderate): Secondary | ICD-10-CM | POA: Diagnosis not present

## 2018-05-24 DIAGNOSIS — E782 Mixed hyperlipidemia: Secondary | ICD-10-CM | POA: Diagnosis not present

## 2018-05-24 DIAGNOSIS — D61818 Other pancytopenia: Secondary | ICD-10-CM | POA: Diagnosis not present

## 2018-05-24 DIAGNOSIS — E1122 Type 2 diabetes mellitus with diabetic chronic kidney disease: Secondary | ICD-10-CM | POA: Diagnosis not present

## 2018-05-24 DIAGNOSIS — I255 Ischemic cardiomyopathy: Secondary | ICD-10-CM

## 2018-05-24 DIAGNOSIS — N401 Enlarged prostate with lower urinary tract symptoms: Secondary | ICD-10-CM | POA: Diagnosis not present

## 2018-05-24 LAB — CUP PACEART REMOTE DEVICE CHECK
Date Time Interrogation Session: 20200427144754
Implantable Lead Implant Date: 20111117
Implantable Lead Implant Date: 20111117
Implantable Lead Location: 753859
Implantable Lead Location: 753860
Implantable Lead Model: 7122
Implantable Pulse Generator Implant Date: 20111117
Pulse Gen Serial Number: 623396

## 2018-05-24 NOTE — Progress Notes (Signed)
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   Thoracic impedance returnednormal since 05/11/2018 remote transmission.   Prescribed:Furosemide 20 mgTake 1 tablet (20 mg total) by mouth daily as needed for edema Daughter previously reported pt does not like to take PRN Furosemide because he does self catheterizations.  Labs: 03/15/2018 Creatinine 1.09, BUN 25, Potassium 4.3, Sodium 144, GFR 59->60 02/01/2018 Creatinine 1.04, BUN 28, Potassium 3.9, Sodium 146, GFR >60 12/16/2017 Creatinine 1.12, BUN 34, Potassium 4.3, Soidum 144, GFR 56->60 12/02/2017 Creatinine 1.02, BUN 30, Potassium 4.4, Sodium 146, GFR 64-74 11/06/2017 Creatinine 1.10, BUN 25, Potassium 3.8, Sodium 143, GFR 57->60 10/07/2017 Creatinine1.09, BUN34, Potassium4.3, [id number], GFR58->60 09/09/2017 Creatinine1.18, BUN34, Potassium4.0, [id number], GFR52->60  06/15/2017 Creatinine1.30, BUN33, Potassium3.5, [id number], GFR47-54  06/01/2017 Creatinine1.29, BUN27, Potassium4.6, Sodium142, IOX73-53  04/27/2017 Creatinine1.14, BUN24, Potassium3.9, [id number], GFR55->60 A complete set of results can be found in Results Review.  Recommendations: Left voice mail with ICM number and encouraged to call if experiencing any fluid symptoms.  Follow-up plan: ICM clinic phone appointment on5/18/2020.  Copy of ICM check sent to Dr.Allred.     3 month ICM trend: 05/24/2018    1 Year ICM trend:       Rosalene Billings, RN 05/24/2018 2:06 PM

## 2018-05-31 ENCOUNTER — Telehealth: Payer: Self-pay | Admitting: *Deleted

## 2018-05-31 ENCOUNTER — Telehealth: Payer: Self-pay | Admitting: Oncology

## 2018-05-31 NOTE — Progress Notes (Signed)
Remote ICD transmission.   

## 2018-05-31 NOTE — Telephone Encounter (Signed)
Scheduled appt per 5/4 sch message - spoke with care giver Cloyde Reams - aware of appt date and time

## 2018-05-31 NOTE — Telephone Encounter (Signed)
Asking if he needs his procrit this month? Patient says he feels well. Per flowsheet he received 60,000 units on 3/16 with Hgb 10.7 and 1 month later Hgb was 8.6. MD suggests he has injection this week due to strong potential he will drop sooner if not. They request lab/injection on 5/6 after 2 pm. Scheduling message sent

## 2018-06-01 ENCOUNTER — Telehealth: Payer: Self-pay | Admitting: Nurse Practitioner

## 2018-06-01 ENCOUNTER — Other Ambulatory Visit: Payer: Self-pay

## 2018-06-01 ENCOUNTER — Inpatient Hospital Stay (HOSPITAL_BASED_OUTPATIENT_CLINIC_OR_DEPARTMENT_OTHER): Payer: Medicare Other | Admitting: Nurse Practitioner

## 2018-06-01 ENCOUNTER — Inpatient Hospital Stay: Payer: Medicare Other | Attending: Oncology

## 2018-06-01 ENCOUNTER — Inpatient Hospital Stay: Payer: Medicare Other

## 2018-06-01 ENCOUNTER — Telehealth: Payer: Self-pay

## 2018-06-01 ENCOUNTER — Encounter: Payer: Self-pay | Admitting: Nurse Practitioner

## 2018-06-01 ENCOUNTER — Telehealth: Payer: Self-pay | Admitting: *Deleted

## 2018-06-01 VITALS — BP 119/60 | HR 87 | Temp 97.5°F | Resp 18 | Ht 72.0 in | Wt 148.0 lb

## 2018-06-01 DIAGNOSIS — Z95810 Presence of automatic (implantable) cardiac defibrillator: Secondary | ICD-10-CM

## 2018-06-01 DIAGNOSIS — C9000 Multiple myeloma not having achieved remission: Secondary | ICD-10-CM | POA: Diagnosis not present

## 2018-06-01 DIAGNOSIS — N400 Enlarged prostate without lower urinary tract symptoms: Secondary | ICD-10-CM | POA: Diagnosis not present

## 2018-06-01 DIAGNOSIS — D649 Anemia, unspecified: Secondary | ICD-10-CM

## 2018-06-01 DIAGNOSIS — J449 Chronic obstructive pulmonary disease, unspecified: Secondary | ICD-10-CM | POA: Diagnosis not present

## 2018-06-01 DIAGNOSIS — D631 Anemia in chronic kidney disease: Secondary | ICD-10-CM

## 2018-06-01 DIAGNOSIS — D61818 Other pancytopenia: Secondary | ICD-10-CM | POA: Diagnosis not present

## 2018-06-01 DIAGNOSIS — N189 Chronic kidney disease, unspecified: Secondary | ICD-10-CM | POA: Diagnosis not present

## 2018-06-01 DIAGNOSIS — E538 Deficiency of other specified B group vitamins: Secondary | ICD-10-CM

## 2018-06-01 LAB — CBC WITH DIFFERENTIAL (CANCER CENTER ONLY)
Abs Immature Granulocytes: 0.02 10*3/uL (ref 0.00–0.07)
Basophils Absolute: 0 10*3/uL (ref 0.0–0.1)
Basophils Relative: 0 %
Eosinophils Absolute: 0.1 10*3/uL (ref 0.0–0.5)
Eosinophils Relative: 1 %
HCT: 25.7 % — ABNORMAL LOW (ref 39.0–52.0)
Hemoglobin: 7.8 g/dL — ABNORMAL LOW (ref 13.0–17.0)
Immature Granulocytes: 0 %
Lymphocytes Relative: 10 %
Lymphs Abs: 0.7 10*3/uL (ref 0.7–4.0)
MCH: 22.9 pg — ABNORMAL LOW (ref 26.0–34.0)
MCHC: 30.4 g/dL (ref 30.0–36.0)
MCV: 75.4 fL — ABNORMAL LOW (ref 80.0–100.0)
Monocytes Absolute: 0.2 10*3/uL (ref 0.1–1.0)
Monocytes Relative: 3 %
Neutro Abs: 5.6 10*3/uL (ref 1.7–7.7)
Neutrophils Relative %: 86 %
Platelet Count: 119 10*3/uL — ABNORMAL LOW (ref 150–400)
RBC: 3.41 MIL/uL — ABNORMAL LOW (ref 4.22–5.81)
RDW: 20.8 % — ABNORMAL HIGH (ref 11.5–15.5)
WBC Count: 6.5 10*3/uL (ref 4.0–10.5)
nRBC: 0 % (ref 0.0–0.2)

## 2018-06-01 LAB — PREPARE RBC (CROSSMATCH)

## 2018-06-01 MED ORDER — EPOETIN ALFA 40000 UNIT/ML IJ SOLN
INTRAMUSCULAR | Status: AC
  Filled 2018-06-01: qty 1

## 2018-06-01 MED ORDER — SODIUM CHLORIDE 0.9% IV SOLUTION
250.0000 mL | Freq: Once | INTRAVENOUS | Status: DC
  Filled 2018-06-01: qty 250

## 2018-06-01 MED ORDER — EPOETIN ALFA 20000 UNIT/ML IJ SOLN
60000.0000 [IU] | Freq: Once | INTRAMUSCULAR | Status: AC
  Administered 2018-06-01: 60000 [IU] via SUBCUTANEOUS

## 2018-06-01 MED ORDER — EPOETIN ALFA 20000 UNIT/ML IJ SOLN
INTRAMUSCULAR | Status: AC
  Filled 2018-06-01: qty 1

## 2018-06-01 NOTE — Telephone Encounter (Signed)
No los per 5/5.

## 2018-06-01 NOTE — Telephone Encounter (Signed)
Chapin B[place], MD [phone]) to further assess shortness of breath reported by daughter [name] [phone]).      "It's gotten better now; I used the machine, nebulizer.  Not sweating.  No pain anywhere.  Do not think I have a fever.  My oxygen saturation level is up to 95 %.  I did not want to receive injection in the ED.  I was up late last night, kept waking up of and on  through the night threw everything off.  Do not think I need to be seen today."     No wheezing or cough noted with call.  Confirmed feeling better, advised to return call for any changes otherwise will see tomorrow for lab and injection beginning at 3:00 pm.

## 2018-06-01 NOTE — Patient Instructions (Signed)
Blood Transfusion, Adult, Care After  This sheet gives you information about how to care for yourself after your procedure. Your health care provider may also give you more specific instructions. If you have problems or questions, contact your health care provider.  What can I expect after the procedure?  After your procedure, it is common to have:   Bruising and soreness where the IV tube was inserted.   Headache.  Follow these instructions at home:     Take over-the-counter and prescription medicines only as told by your health care provider.   Return to your normal activities as told by your health care provider.   Follow instructions from your health care provider about how to take care of your IV insertion site. Make sure you:  ? Wash your hands with soap and water before you change your bandage (dressing). If soap and water are not available, use hand sanitizer.  ? Change your dressing as told by your health care provider.   Check your IV insertion site every day for signs of infection. Check for:  ? More redness, swelling, or pain.  ? More fluid or blood.  ? Warmth.  ? Pus or a bad smell.  Contact a health care provider if:   You have more redness, swelling, or pain around the IV insertion site.   You have more fluid or blood coming from the IV insertion site.   Your IV insertion site feels warm to the touch.   You have pus or a bad smell coming from the IV insertion site.   Your urine turns pink, red, or brown.   You feel weak after doing your normal activities.  Get help right away if:   You have signs of a serious allergic or immune system reaction, including:  ? Itchiness.  ? Hives.  ? Trouble breathing.  ? Anxiety.  ? Chest or lower back pain.  ? Fever, flushing, and chills.  ? Rapid pulse.  ? Rash.  ? Diarrhea.  ? Vomiting.  ? Dark urine.  ? Serious headache.  ? Dizziness.  ? Stiff neck.  ? Yellow coloration of the face or the white parts of the eyes (jaundice).  This information is not  intended to replace advice given to you by your health care provider. Make sure you discuss any questions you have with your health care provider.  Document Released: 02/03/2014 Document Revised: 09/12/2015 Document Reviewed: 07/30/2015  Elsevier Interactive Patient Education  2019 [employer]vier [employer]

## 2018-06-01 NOTE — Telephone Encounter (Signed)
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 was having SOB stated she thinks he needs to be seen. Per Dr. [name] Pt. Will come in today for labs, be seen by Lattie Haw, injection and possible transfusion.

## 2018-06-01 NOTE — Progress Notes (Signed)
  [place] OFFICE PROGRESS NOTE   Diagnosis: Multiple myeloma  INTERVAL HISTORY:   [name] [name] returns prior to scheduled follow-up due to shortness of breath.  He reports difficulty sleeping last night.  He woke up multiple times.  He felt short of breath.  He denies wheezing.  He denies pain.  Breathing is better this morning.  Objective:  Vital signs in last 24 hours:  Blood pressure 119/60, pulse 87, temperature (!) 97.5 F (36.4 C), temperature source Oral, resp. rate 18, height 6' (1.829 m), weight 148 lb (67.1 kg), SpO2 97 %.    Resp: Lungs clear bilaterally.  No respiratory distress. Cardio: Regular rate and rhythm. Vascular: No leg edema.    Lab Results:  Lab Results  Component Value Date   WBC 6.5 06/01/2018   HGB 7.8 (L) 06/01/2018   HCT 25.7 (L) 06/01/2018   MCV 75.4 (L) 06/01/2018   PLT 119 (L) 06/01/2018   NEUTROABS 5.6 06/01/2018    Imaging:  No results found.  Medications: I have reviewed the patient's current medications.  Assessment/Plan: 1.Pancytopenia-transfused with packed red blood cells 11/28/2016, 12/27/2016, 01/13/2017, 02/02/2017 2. History of beta thalassemia trait 3. History of vitamin B-12 deficiency-vitamin B-12 replacement initiated 11/11/2016 4. History of a serum monoclonal IgAkappaprotein-elevated serum free kappa light chains, IgA, and M spike10/16/2018  Bone marrow biopsy 12/03/2016-consistent with multiple myeloma  Bone survey 12/06/2016-no destructive or lytic bone lesions identified.  Revlimid 21 days on/7 days off and weekly dexamethasone 12/16/2016(Decadron discontinued 12/29/2016 secondary to altered mental status)  Cycle 2 single agent Revlimid 01/14/2017(discontinued 3 days early)  Serum light chains, IgAimproved 02/02/2017  Cycle3 single agent Revlimid beginning 02/16/2017  Cycle4single agent Revlimid beginning 03/16/2017  Cycle 5 single agent Revlimid beginning 04/14/2017(Revlimid placed on  hold beginning 04/28/2017 due to thrombocytopenia)  Cycle 6 single agent Revlimid beginning 05/23/2017 x 14 days 5. COPD with acute and chronic respiratory failure/hypoxemia 6. History of coronary artery disease 7. Ischemic cardiomyopathy 8. ICD in place 9. Prostatic hypertrophy 10.Inappropriately low erythropoietin level 11/28/2016 11. Severe pancytopenia secondary to multiple myeloma and Revlimid-improved 12. Fever-likely related to an upper respiratory infection, RSV positive swab 02/02/2017 13.Anemia secondary to renal insufficiency-trial of weekly erythropoietin initiated 06/17/2017.Erythropoietin dose increased to 60,000 units weekly beginning 07/28/2017.  Erythropoietin changed to every 2 weeks beginning8/14/2019   Disposition: [name] [name] has symptomatic anemia.  We have made arrangements for a blood transfusion today.  He is also scheduled to receive a Procrit injection.  We discussed initiating treatment of the myeloma with melphalan/prednisone if he continues to require transfusion support.  He will return for lab and Procrit in 2 weeks.  We will see him in follow-up in 4 weeks.  Above reviewed with daughter, [name], by phone.  Patient seen with Dr. [name].    Ned Card ANP/GNP-BC   06/01/2018  10:43 AM  This was a shared visit with Ned Card.  [name] [name] again has symptomatic anemia.  He will receive a red cell transfusion this week.  He will resume erythropoietin therapy.  The plan is to initiate treatment for multiple myeloma if he requires further transfusion support.  I recommend melphalan/prednisone therapy since he will not agree to treatment that may cause neuropathy.  Julieanne Manson, MD

## 2018-06-02 ENCOUNTER — Inpatient Hospital Stay: Payer: Medicare Other

## 2018-06-02 LAB — TYPE AND SCREEN
ABO/RH(D): A POS
Antibody Screen: NEGATIVE
Unit division: 0

## 2018-06-02 LAB — BPAM RBC
Blood Product Expiration Date: 202006092359
ISSUE DATE / TIME: 202005051300
Unit Type and Rh: 5100

## 2018-06-03 ENCOUNTER — Telehealth: Payer: Self-pay | Admitting: Internal Medicine

## 2018-06-03 NOTE — Telephone Encounter (Signed)
Spoke w/ pt daughter and requested that patient send a manual transmission w/ his home monitor. She stated that she will call patient and have him do that.

## 2018-06-03 NOTE — Telephone Encounter (Signed)
Transmission reviewed. No alerts. Normal device function.  Chanetta Marshall, NP 06/03/2018 11:46 AM

## 2018-06-03 NOTE — Telephone Encounter (Signed)
New Message:   Daughter said pt Defibrillator was vibrating last night. She said this had never happen before.

## 2018-06-14 ENCOUNTER — Other Ambulatory Visit: Payer: Self-pay

## 2018-06-14 ENCOUNTER — Ambulatory Visit (INDEPENDENT_AMBULATORY_CARE_PROVIDER_SITE_OTHER): Payer: Medicare Other

## 2018-06-14 DIAGNOSIS — I5032 Chronic diastolic (congestive) heart failure: Secondary | ICD-10-CM | POA: Diagnosis not present

## 2018-06-14 DIAGNOSIS — Z95810 Presence of automatic (implantable) cardiac defibrillator: Secondary | ICD-10-CM | POA: Diagnosis not present

## 2018-06-15 ENCOUNTER — Telehealth: Payer: Self-pay | Admitting: Oncology

## 2018-06-15 NOTE — Telephone Encounter (Signed)
Scheduled appt per 5/05 los - left message for daughter with appt date and time

## 2018-06-15 NOTE — Progress Notes (Signed)
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   Thoracic impedance normal.  Prescribed:Furosemide 20 mgTake 1 tablet (20 mg total) by mouth daily as needed for edema Daughter previously reported pt does not like to take PRN Furosemide because he does self catheterizations.  Labs: 03/15/2018 Creatinine 1.09, BUN 25, Potassium 4.3, Sodium 144, GFR 59->60 02/01/2018 Creatinine 1.04, BUN 28, Potassium 3.9, Sodium 146, GFR >60 12/16/2017 Creatinine 1.12, BUN 34, Potassium 4.3, Soidum 144, GFR 56->60 12/02/2017 Creatinine 1.02, BUN 30, Potassium 4.4, Sodium 146, GFR 64-74 11/06/2017 Creatinine 1.10, BUN 25, Potassium 3.8, Sodium 143, GFR 57->60 10/07/2017 Creatinine1.09, BUN34, Potassium4.3, [id number], GFR58->60 09/09/2017 Creatinine1.18, BUN34, Potassium4.0, [id number], GFR52->60  06/15/2017 Creatinine1.30, BUN33, Potassium3.5, [id number], GFR47-54  06/01/2017 Creatinine1.29, BUN27, Potassium4.6, Sodium142, HKU57-50  04/27/2017 Creatinine1.14, BUN24, Potassium3.9, [id number], GFR55->60 A complete set of results can be found in Results Review.  Recommendations: No changes and encouraged to call if pt develops any fluid symptoms  Follow-up plan: ICM clinic phone appointment on6/22/2020.  Copy of ICM check sent to Dr.Allred.   3 month ICM trend: 06/14/2018    1 Year ICM trend:       Rosalene Billings, RN 06/15/2018 9:45 AM

## 2018-06-16 ENCOUNTER — Inpatient Hospital Stay: Payer: Medicare Other

## 2018-06-16 ENCOUNTER — Other Ambulatory Visit: Payer: Self-pay

## 2018-06-16 VITALS — BP 125/55 | HR 66 | Temp 98.1°F | Resp 18

## 2018-06-16 DIAGNOSIS — N400 Enlarged prostate without lower urinary tract symptoms: Secondary | ICD-10-CM | POA: Diagnosis not present

## 2018-06-16 DIAGNOSIS — J449 Chronic obstructive pulmonary disease, unspecified: Secondary | ICD-10-CM | POA: Diagnosis not present

## 2018-06-16 DIAGNOSIS — D649 Anemia, unspecified: Secondary | ICD-10-CM

## 2018-06-16 DIAGNOSIS — D631 Anemia in chronic kidney disease: Secondary | ICD-10-CM | POA: Diagnosis not present

## 2018-06-16 DIAGNOSIS — C9000 Multiple myeloma not having achieved remission: Secondary | ICD-10-CM | POA: Diagnosis not present

## 2018-06-16 DIAGNOSIS — E538 Deficiency of other specified B group vitamins: Secondary | ICD-10-CM

## 2018-06-16 DIAGNOSIS — D61818 Other pancytopenia: Secondary | ICD-10-CM | POA: Diagnosis not present

## 2018-06-16 DIAGNOSIS — N189 Chronic kidney disease, unspecified: Secondary | ICD-10-CM | POA: Diagnosis not present

## 2018-06-16 LAB — CBC WITH DIFFERENTIAL (CANCER CENTER ONLY)
Abs Immature Granulocytes: 0 10*3/uL (ref 0.00–0.07)
Basophils Absolute: 0 10*3/uL (ref 0.0–0.1)
Basophils Relative: 0 %
Eosinophils Absolute: 0.1 10*3/uL (ref 0.0–0.5)
Eosinophils Relative: 4 %
HCT: 27.9 % — ABNORMAL LOW (ref 39.0–52.0)
Hemoglobin: 8.7 g/dL — ABNORMAL LOW (ref 13.0–17.0)
Immature Granulocytes: 0 %
Lymphocytes Relative: 24 %
Lymphs Abs: 0.8 10*3/uL (ref 0.7–4.0)
MCH: 24 pg — ABNORMAL LOW (ref 26.0–34.0)
MCHC: 31.2 g/dL (ref 30.0–36.0)
MCV: 76.9 fL — ABNORMAL LOW (ref 80.0–100.0)
Monocytes Absolute: 0.1 10*3/uL (ref 0.1–1.0)
Monocytes Relative: 3 %
Neutro Abs: 2.5 10*3/uL (ref 1.7–7.7)
Neutrophils Relative %: 69 %
Platelet Count: 111 10*3/uL — ABNORMAL LOW (ref 150–400)
RBC: 3.63 MIL/uL — ABNORMAL LOW (ref 4.22–5.81)
RDW: 20.9 % — ABNORMAL HIGH (ref 11.5–15.5)
WBC Count: 3.6 10*3/uL — ABNORMAL LOW (ref 4.0–10.5)
nRBC: 0 % (ref 0.0–0.2)

## 2018-06-16 LAB — SAMPLE TO BLOOD BANK

## 2018-06-16 MED ORDER — EPOETIN ALFA 40000 UNIT/ML IJ SOLN
INTRAMUSCULAR | Status: AC
  Filled 2018-06-16: qty 1

## 2018-06-16 MED ORDER — EPOETIN ALFA 20000 UNIT/ML IJ SOLN
60000.0000 [IU] | Freq: Once | INTRAMUSCULAR | Status: AC
  Administered 2018-06-16: 15:00:00 60000 [IU] via SUBCUTANEOUS

## 2018-06-16 MED ORDER — EPOETIN ALFA 20000 UNIT/ML IJ SOLN
INTRAMUSCULAR | Status: AC
  Filled 2018-06-16: qty 1

## 2018-06-17 ENCOUNTER — Other Ambulatory Visit: Payer: Medicare Other

## 2018-06-17 ENCOUNTER — Ambulatory Visit: Payer: Medicare Other

## 2018-06-28 ENCOUNTER — Other Ambulatory Visit: Payer: Self-pay

## 2018-06-28 ENCOUNTER — Encounter: Payer: Self-pay | Admitting: Nurse Practitioner

## 2018-06-28 ENCOUNTER — Inpatient Hospital Stay (HOSPITAL_BASED_OUTPATIENT_CLINIC_OR_DEPARTMENT_OTHER): Payer: Medicare Other | Admitting: Nurse Practitioner

## 2018-06-28 ENCOUNTER — Inpatient Hospital Stay: Payer: Medicare Other | Attending: Oncology

## 2018-06-28 ENCOUNTER — Inpatient Hospital Stay: Payer: Medicare Other

## 2018-06-28 VITALS — BP 147/59 | HR 75 | Temp 98.5°F | Resp 20 | Ht 72.0 in | Wt 147.1 lb

## 2018-06-28 DIAGNOSIS — N189 Chronic kidney disease, unspecified: Secondary | ICD-10-CM | POA: Diagnosis not present

## 2018-06-28 DIAGNOSIS — E538 Deficiency of other specified B group vitamins: Secondary | ICD-10-CM

## 2018-06-28 DIAGNOSIS — D563 Thalassemia minor: Secondary | ICD-10-CM | POA: Diagnosis not present

## 2018-06-28 DIAGNOSIS — D631 Anemia in chronic kidney disease: Secondary | ICD-10-CM

## 2018-06-28 DIAGNOSIS — D61818 Other pancytopenia: Secondary | ICD-10-CM | POA: Insufficient documentation

## 2018-06-28 DIAGNOSIS — D649 Anemia, unspecified: Secondary | ICD-10-CM

## 2018-06-28 DIAGNOSIS — C9000 Multiple myeloma not having achieved remission: Secondary | ICD-10-CM | POA: Insufficient documentation

## 2018-06-28 LAB — CMP (CANCER CENTER ONLY)
ALT: 10 U/L (ref 0–44)
AST: 12 U/L — ABNORMAL LOW (ref 15–41)
Albumin: 3.3 g/dL — ABNORMAL LOW (ref 3.5–5.0)
Alkaline Phosphatase: 48 U/L (ref 38–126)
Anion gap: 10 (ref 5–15)
BUN: 30 mg/dL — ABNORMAL HIGH (ref 8–23)
CO2: 26 mmol/L (ref 22–32)
Calcium: 8.4 mg/dL — ABNORMAL LOW (ref 8.9–10.3)
Chloride: 107 mmol/L (ref 98–111)
Creatinine: 1.05 mg/dL (ref 0.61–1.24)
GFR, Est AFR Am: >60 mL/min (ref >60)
GFR, Estimated: >60 mL/min (ref >60)
Glucose, Bld: 101 mg/dL — ABNORMAL HIGH (ref 70–99)
Potassium: 4.1 mmol/L (ref 3.5–5.1)
Sodium: 143 mmol/L (ref 135–145)
Total Bilirubin: 0.4 mg/dL (ref 0.3–1.2)
Total Protein: 7.1 g/dL (ref 6.5–8.1)

## 2018-06-28 LAB — CBC WITH DIFFERENTIAL (CANCER CENTER ONLY)
Abs Immature Granulocytes: 0.01 10*3/uL (ref 0.00–0.07)
Basophils Absolute: 0 10*3/uL (ref 0.0–0.1)
Basophils Relative: 0 %
Eosinophils Absolute: 0.1 10*3/uL (ref 0.0–0.5)
Eosinophils Relative: 3 %
HCT: 28.2 % — ABNORMAL LOW (ref 39.0–52.0)
Hemoglobin: 8.7 g/dL — ABNORMAL LOW (ref 13.0–17.0)
Immature Granulocytes: 0 %
Lymphocytes Relative: 31 %
Lymphs Abs: 1 10*3/uL (ref 0.7–4.0)
MCH: 23.3 pg — ABNORMAL LOW (ref 26.0–34.0)
MCHC: 30.9 g/dL (ref 30.0–36.0)
MCV: 75.6 fL — ABNORMAL LOW (ref 80.0–100.0)
Monocytes Absolute: 0.1 10*3/uL (ref 0.1–1.0)
Monocytes Relative: 4 %
Neutro Abs: 2 10*3/uL (ref 1.7–7.7)
Neutrophils Relative %: 62 %
Platelet Count: 96 10*3/uL — ABNORMAL LOW (ref 150–400)
RBC: 3.73 MIL/uL — ABNORMAL LOW (ref 4.22–5.81)
RDW: 21.7 % — ABNORMAL HIGH (ref 11.5–15.5)
WBC Count: 3.3 10*3/uL — ABNORMAL LOW (ref 4.0–10.5)
nRBC: 0.6 % — ABNORMAL HIGH (ref 0.0–0.2)

## 2018-06-28 LAB — SAMPLE TO BLOOD BANK

## 2018-06-28 MED ORDER — EPOETIN ALFA 40000 UNIT/ML IJ SOLN
INTRAMUSCULAR | Status: AC
  Filled 2018-06-28: qty 1

## 2018-06-28 MED ORDER — EPOETIN ALFA 20000 UNIT/ML IJ SOLN
60000.0000 [IU] | Freq: Once | INTRAMUSCULAR | Status: AC
  Administered 2018-06-28: 60000 [IU] via SUBCUTANEOUS

## 2018-06-28 MED ORDER — EPOETIN ALFA 20000 UNIT/ML IJ SOLN
INTRAMUSCULAR | Status: AC
  Filled 2018-06-28: qty 1

## 2018-06-28 NOTE — Progress Notes (Signed)
  [place] OFFICE PROGRESS NOTE   Diagnosis: Multiple myeloma  INTERVAL HISTORY:   [name] [name] returns as scheduled.  He was transfused a unit of blood on 06/01/2018.  He reports no change in baseline dyspnea on exertion.  He has a periodic cough to clear "phlegm".  No fever.  No bowel or bladder problems.  Stable appetite.  Objective:  Vital signs in last 24 hours:  Blood pressure (!) 147/59, pulse 75, temperature 98.5 F (36.9 C), temperature source Oral, resp. rate 20, height 6' (1.829 m), weight 147 lb 1.6 oz (66.7 kg), SpO2 100 %.    Vascular: Trace edema at the lower legs bilaterally.    Lab Results:  Lab Results  Component Value Date   WBC 3.3 (L) 06/28/2018   HGB 8.7 (L) 06/28/2018   HCT 28.2 (L) 06/28/2018   MCV 75.6 (L) 06/28/2018   PLT 96 (L) 06/28/2018   NEUTROABS 2.0 06/28/2018    Imaging:  No results found.  Medications: I have reviewed the patient's current medications.  Assessment/Plan: 1.Pancytopenia-transfused with packed red blood cells 11/28/2016, 12/27/2016, 01/13/2017, 02/02/2017 2. History of beta thalassemia trait 3. History of vitamin B-12 deficiency-vitamin B-12 replacement initiated 11/11/2016 4. History of a serum monoclonal IgAkappaprotein-elevated serum free kappa light chains, IgA, and M spike10/16/2018  Bone marrow biopsy 12/03/2016-consistent with multiple myeloma  Bone survey 12/06/2016-no destructive or lytic bone lesions identified.  Revlimid 21 days on/7 days off and weekly dexamethasone 12/16/2016(Decadron discontinued 12/29/2016 secondary to altered mental status)  Cycle 2 single agent Revlimid 01/14/2017(discontinued 3 days early)  Serum light chains, IgAimproved 02/02/2017  Cycle3 single agent Revlimid beginning 02/16/2017  Cycle4single agent Revlimid beginning 03/16/2017  Cycle 5 single agent Revlimid beginning 04/14/2017(Revlimid placed on hold beginning 04/28/2017 due to thrombocytopenia)  Cycle 6  single agent Revlimid beginning 05/23/2017 x 14 days 5. COPD with acute and chronic respiratory failure/hypoxemia 6. History of coronary artery disease 7. Ischemic cardiomyopathy 8. ICD in place 9. Prostatic hypertrophy 10.Inappropriately low erythropoietin level 11/28/2016 11. Severe pancytopenia secondary to multiple myeloma and Revlimid-improved 12. Fever-likely related to an upper respiratory infection, RSV positive swab 02/02/2017 13.Anemia secondary to renal insufficiency-trial of weekly erythropoietin initiated 06/17/2017.Erythropoietin dose increased to 60,000 units weekly beginning 07/28/2017.  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  He was last transfused a unit of blood approximately 1 month ago.  Hemoglobin remains stable.  Plan to continue every 2-week erythropoietin.  We will follow-up on the myeloma labs from today.  If there is evidence of progression the plan is to initiate treatment with melphalan/prednisone.  We will see him in follow-up in 6 weeks.  Patient seen with [name].    [name] [name] ANP/GNP-BC   06/28/2018  3:23 PM This was a shared visit with [name] [name].  [name] [name] was last transfused with packed red blood cells on 06/01/2018.  Hemoglobin is stable today.  The plan is to continue erythropoietin therapy. We will begin melphalan/prednisone if he develops progressive anemia or other signs of progressive myeloma.  [name] [name] commended

## 2018-06-29 ENCOUNTER — Telehealth: Payer: Self-pay | Admitting: Nurse Practitioner

## 2018-06-29 LAB — PROTEIN ELECTROPHORESIS, SERUM
A/G Ratio: 1.2 (ref 0.7–1.7)
Albumin ELP: 3.5 g/dL (ref 2.9–4.4)
Alpha-1-Globulin: 0.2 g/dL (ref 0.0–0.4)
Alpha-2-Globulin: 0.5 g/dL (ref 0.4–1.0)
Beta Globulin: 2.1 g/dL — ABNORMAL HIGH (ref 0.7–1.3)
Gamma Globulin: 0.2 g/dL — ABNORMAL LOW (ref 0.4–1.8)
Globulin, Total: 3 g/dL (ref 2.2–3.9)
M-Spike, %: 1.1 g/dL — ABNORMAL HIGH
Total Protein ELP: 6.5 g/dL (ref 6.0–8.5)

## 2018-06-29 LAB — KAPPA/LAMBDA LIGHT CHAINS
Kappa free light chain: 1029.7 mg/L — ABNORMAL HIGH (ref 3.3–19.4)
Kappa, lambda light chain ratio: 135.49 — ABNORMAL HIGH (ref 0.26–1.65)
Lambda free light chains: 7.6 mg/L (ref 5.7–26.3)

## 2018-06-29 LAB — IGA: IgA: 1524 mg/dL — ABNORMAL HIGH (ref 61–437)

## 2018-06-29 NOTE — Telephone Encounter (Signed)
Scheduled appt per 6/1 los.  Spoke with patient daughter and she is aware of appt date and time.

## 2018-07-01 ENCOUNTER — Ambulatory Visit: Payer: Medicare Other | Admitting: Nurse Practitioner

## 2018-07-01 ENCOUNTER — Ambulatory Visit: Payer: Medicare Other

## 2018-07-01 ENCOUNTER — Other Ambulatory Visit: Payer: Medicare Other

## 2018-07-02 ENCOUNTER — Other Ambulatory Visit: Payer: Self-pay | Admitting: Nurse Practitioner

## 2018-07-02 DIAGNOSIS — C9000 Multiple myeloma not having achieved remission: Secondary | ICD-10-CM

## 2018-07-12 ENCOUNTER — Other Ambulatory Visit: Payer: Self-pay

## 2018-07-12 ENCOUNTER — Inpatient Hospital Stay: Payer: Medicare Other

## 2018-07-12 VITALS — BP 113/64 | HR 71 | Temp 98.2°F | Resp 20

## 2018-07-12 DIAGNOSIS — C9000 Multiple myeloma not having achieved remission: Secondary | ICD-10-CM | POA: Diagnosis not present

## 2018-07-12 DIAGNOSIS — D563 Thalassemia minor: Secondary | ICD-10-CM | POA: Diagnosis not present

## 2018-07-12 DIAGNOSIS — N189 Chronic kidney disease, unspecified: Secondary | ICD-10-CM | POA: Diagnosis not present

## 2018-07-12 DIAGNOSIS — E538 Deficiency of other specified B group vitamins: Secondary | ICD-10-CM

## 2018-07-12 DIAGNOSIS — D61818 Other pancytopenia: Secondary | ICD-10-CM | POA: Diagnosis not present

## 2018-07-12 DIAGNOSIS — D631 Anemia in chronic kidney disease: Secondary | ICD-10-CM | POA: Diagnosis not present

## 2018-07-12 LAB — CBC WITH DIFFERENTIAL (CANCER CENTER ONLY)
Abs Immature Granulocytes: 0.01 10*3/uL (ref 0.00–0.07)
Basophils Absolute: 0 10*3/uL (ref 0.0–0.1)
Basophils Relative: 0 %
Eosinophils Absolute: 0.1 10*3/uL (ref 0.0–0.5)
Eosinophils Relative: 3 %
HCT: 26.7 % — ABNORMAL LOW (ref 39.0–52.0)
Hemoglobin: 8 g/dL — ABNORMAL LOW (ref 13.0–17.0)
Immature Granulocytes: 0 %
Lymphocytes Relative: 20 %
Lymphs Abs: 0.8 10*3/uL (ref 0.7–4.0)
MCH: 23.1 pg — ABNORMAL LOW (ref 26.0–34.0)
MCHC: 30 g/dL (ref 30.0–36.0)
MCV: 76.9 fL — ABNORMAL LOW (ref 80.0–100.0)
Monocytes Absolute: 0.2 10*3/uL (ref 0.1–1.0)
Monocytes Relative: 4 %
Neutro Abs: 2.9 10*3/uL (ref 1.7–7.7)
Neutrophils Relative %: 73 %
Platelet Count: 96 10*3/uL — ABNORMAL LOW (ref 150–400)
RBC: 3.47 MIL/uL — ABNORMAL LOW (ref 4.22–5.81)
RDW: 21.8 % — ABNORMAL HIGH (ref 11.5–15.5)
WBC Count: 3.9 10*3/uL — ABNORMAL LOW (ref 4.0–10.5)
nRBC: 0 % (ref 0.0–0.2)

## 2018-07-12 LAB — SAMPLE TO BLOOD BANK

## 2018-07-12 MED ORDER — EPOETIN ALFA 20000 UNIT/ML IJ SOLN
60000.0000 [IU] | Freq: Once | INTRAMUSCULAR | Status: AC
  Administered 2018-07-12: 60000 [IU] via SUBCUTANEOUS

## 2018-07-12 MED ORDER — EPOETIN ALFA 40000 UNIT/ML IJ SOLN
INTRAMUSCULAR | Status: AC
  Filled 2018-07-12: qty 1

## 2018-07-12 MED ORDER — EPOETIN ALFA 20000 UNIT/ML IJ SOLN
INTRAMUSCULAR | Status: AC
  Filled 2018-07-12: qty 1

## 2018-07-12 NOTE — Patient Instructions (Signed)
Epoetin Alfa injection °What is this medicine? °EPOETIN ALFA (e POE e tin AL fa) helps your body make more red blood cells. This medicine is used to treat anemia caused by chronic kidney disease, cancer chemotherapy, or HIV-therapy. It may also be used before surgery if you have anemia. °This medicine may be used for other purposes; ask your health care provider or pharmacist if you have questions. °COMMON BRAND NAME(S): Epogen, Procrit, Retacrit °What should I tell my health care provider before I take this medicine? °They need to know if you have any of these conditions: °-cancer °-heart disease °-high blood pressure °-history of blood clots °-history of stroke °-low levels of folate, iron, or vitamin B12 in the blood °-seizures °-an unusual or allergic reaction to erythropoietin, albumin, benzyl alcohol, hamster proteins, other medicines, foods, dyes, or preservatives °-pregnant or trying to get pregnant °-breast-feeding °How should I use this medicine? °This medicine is for injection into a vein or under the skin. It is usually given by a health care professional in a hospital or clinic setting. °If you get this medicine at home, you will be taught how to prepare and give this medicine. Use exactly as directed. Take your medicine at regular intervals. Do not take your medicine more often than directed. °It is important that you put your used needles and syringes in a special sharps container. Do not put them in a trash can. If you do not have a sharps container, call your pharmacist or healthcare provider to get one. °A special MedGuide will be given to you by the pharmacist with each prescription and refill. Be sure to read this information carefully each time. °Talk to your pediatrician regarding the use of this medicine in children. While this drug may be prescribed for selected conditions, precautions do apply. °Overdosage: If you think you have taken too much of this medicine contact a poison control center  or emergency room at once. °NOTE: This medicine is only for you. Do not share this medicine with others. °What if I miss a dose? °If you miss a dose, take it as soon as you can. If it is almost time for your next dose, take only that dose. Do not take double or extra doses. °What may interact with this medicine? °Interactions have not been studied. °This list may not describe all possible interactions. Give your health care provider a list of all the medicines, herbs, non-prescription drugs, or dietary supplements you use. Also tell them if you smoke, drink alcohol, or use illegal drugs. Some items may interact with your medicine. °What should I watch for while using this medicine? °Your condition will be monitored carefully while you are receiving this medicine. °You may need blood work done while you are taking this medicine. °This medicine may cause a decrease in vitamin B6. You should make sure that you get enough vitamin B6 while you are taking this medicine. Discuss the foods you eat and the vitamins you take with your health care professional. °What side effects may I notice from receiving this medicine? °Side effects that you should report to your doctor or health care professional as soon as possible: °-allergic reactions like skin rash, itching or hives, swelling of the face, lips, or tongue °-seizures °-signs and symptoms of a blood clot such as breathing problems; changes in vision; chest pain; severe, sudden headache; pain, swelling, warmth in the leg; trouble speaking; sudden numbness or weakness of the face, arm or leg °-signs and symptoms of a stroke   like changes in vision; confusion; trouble speaking or understanding; severe headaches; sudden numbness or weakness of the face, arm or leg; trouble walking; dizziness; loss of balance or coordination °Side effects that usually do not require medical attention (report to your doctor or health care professional if they continue or are  bothersome): °-chills °-cough °-dizziness °-fever °-headaches °-joint pain °-muscle cramps °-muscle pain °-nausea, vomiting °-pain, redness, or irritation at site where injected °This list may not describe all possible side effects. Call your doctor for medical advice about side effects. You may report side effects to FDA at 1-800-FDA-1088. °Where should I keep my medicine? °Keep out of the reach of children. °Store in a refrigerator between 2 and 8 degrees C (36 and 46 degrees F). Do not freeze or shake. Throw away any unused portion if using a single-dose vial. Multi-dose vials can be kept in the refrigerator for up to 21 days after the initial dose. Throw away unused medicine. °NOTE: This sheet is a summary. It may not cover all possible information. If you have questions about this medicine, talk to your doctor, pharmacist, or health care provider. °© 2019 Elsevier/Gold Standard (2016-08-22 08:35:19) ° °

## 2018-07-14 DIAGNOSIS — M1990 Unspecified osteoarthritis, unspecified site: Secondary | ICD-10-CM | POA: Diagnosis not present

## 2018-07-14 DIAGNOSIS — J441 Chronic obstructive pulmonary disease with (acute) exacerbation: Secondary | ICD-10-CM | POA: Diagnosis not present

## 2018-07-14 DIAGNOSIS — N183 Chronic kidney disease, stage 3 (moderate): Secondary | ICD-10-CM | POA: Diagnosis not present

## 2018-07-14 DIAGNOSIS — E782 Mixed hyperlipidemia: Secondary | ICD-10-CM | POA: Diagnosis not present

## 2018-07-14 DIAGNOSIS — D61818 Other pancytopenia: Secondary | ICD-10-CM | POA: Diagnosis not present

## 2018-07-14 DIAGNOSIS — I252 Old myocardial infarction: Secondary | ICD-10-CM | POA: Diagnosis not present

## 2018-07-14 DIAGNOSIS — E119 Type 2 diabetes mellitus without complications: Secondary | ICD-10-CM | POA: Diagnosis not present

## 2018-07-14 DIAGNOSIS — N401 Enlarged prostate with lower urinary tract symptoms: Secondary | ICD-10-CM | POA: Diagnosis not present

## 2018-07-14 DIAGNOSIS — E1122 Type 2 diabetes mellitus with diabetic chronic kidney disease: Secondary | ICD-10-CM | POA: Diagnosis not present

## 2018-07-14 DIAGNOSIS — I5030 Unspecified diastolic (congestive) heart failure: Secondary | ICD-10-CM | POA: Diagnosis not present

## 2018-07-14 DIAGNOSIS — I10 Essential (primary) hypertension: Secondary | ICD-10-CM | POA: Diagnosis not present

## 2018-07-14 DIAGNOSIS — I2510 Atherosclerotic heart disease of native coronary artery without angina pectoris: Secondary | ICD-10-CM | POA: Diagnosis not present

## 2018-07-19 ENCOUNTER — Ambulatory Visit (INDEPENDENT_AMBULATORY_CARE_PROVIDER_SITE_OTHER): Payer: Medicare Other

## 2018-07-19 DIAGNOSIS — I5032 Chronic diastolic (congestive) heart failure: Secondary | ICD-10-CM

## 2018-07-19 DIAGNOSIS — Z95810 Presence of automatic (implantable) cardiac defibrillator: Secondary | ICD-10-CM | POA: Diagnosis not present

## 2018-07-21 NOTE — Progress Notes (Signed)
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   CorVue thoracic impedancenormal.  Prescribed:Furosemide 20 mgTake 1 tablet (20 mg total) by mouth daily as needed for edema.Daughter previously reported pt does not like to take PRN Furosemide because he does self catheterizations.  Labs: 03/15/2018 Creatinine 1.09, BUN 25, Potassium 4.3, Sodium 144, GFR 59->60 02/01/2018 Creatinine 1.04, BUN 28, Potassium 3.9, Sodium 146, GFR >60 12/16/2017 Creatinine 1.12, BUN 34, Potassium 4.3, Soidum 144, GFR 56->60 12/02/2017 Creatinine 1.02, BUN 30, Potassium 4.4, Sodium 146, GFR 64-74 11/06/2017 Creatinine 1.10, BUN 25, Potassium 3.8, Sodium 143, GFR 57->60 10/07/2017 Creatinine1.09, BUN34, Potassium4.3, [id number], GFR58->60 09/09/2017 Creatinine1.18, BUN34, Potassium4.0, [id number], GFR52->60  06/15/2017 Creatinine1.30, BUN33, Potassium3.5, [id number], GFR47-54  06/01/2017 Creatinine1.29, BUN27, Potassium4.6, Sodium142, [id number]  04/27/2017 Creatinine1.14, BUN24, Potassium3.9, [id number], GFR55->60 A complete set of results can be found in Results Review.  Recommendations: No changes and encouraged to call if pt develops any fluid symptoms  Follow-up plan: ICM clinic phone appointment on7/28/2020.  Copy of ICM check sent to Dr.Allred.  3 month ICM trend: 07/19/2018    1 Year ICM trend:       Rosalene Billings, RN 07/21/2018 12:05 PM

## 2018-07-26 ENCOUNTER — Other Ambulatory Visit: Payer: Self-pay

## 2018-07-26 ENCOUNTER — Inpatient Hospital Stay: Payer: Medicare Other

## 2018-07-26 ENCOUNTER — Other Ambulatory Visit: Payer: Self-pay | Admitting: *Deleted

## 2018-07-26 VITALS — BP 123/51 | HR 66 | Temp 99.1°F | Resp 25

## 2018-07-26 DIAGNOSIS — D563 Thalassemia minor: Secondary | ICD-10-CM | POA: Diagnosis not present

## 2018-07-26 DIAGNOSIS — C9000 Multiple myeloma not having achieved remission: Secondary | ICD-10-CM

## 2018-07-26 DIAGNOSIS — D631 Anemia in chronic kidney disease: Secondary | ICD-10-CM | POA: Diagnosis not present

## 2018-07-26 DIAGNOSIS — D649 Anemia, unspecified: Secondary | ICD-10-CM

## 2018-07-26 DIAGNOSIS — D61818 Other pancytopenia: Secondary | ICD-10-CM | POA: Diagnosis not present

## 2018-07-26 DIAGNOSIS — E538 Deficiency of other specified B group vitamins: Secondary | ICD-10-CM

## 2018-07-26 DIAGNOSIS — N189 Chronic kidney disease, unspecified: Secondary | ICD-10-CM | POA: Diagnosis not present

## 2018-07-26 LAB — CBC WITH DIFFERENTIAL (CANCER CENTER ONLY)
Abs Immature Granulocytes: 0.01 10*3/uL (ref 0.00–0.07)
Basophils Absolute: 0 10*3/uL (ref 0.0–0.1)
Basophils Relative: 0 %
Eosinophils Absolute: 0.1 10*3/uL (ref 0.0–0.5)
Eosinophils Relative: 3 %
HCT: 25.7 % — ABNORMAL LOW (ref 39.0–52.0)
Hemoglobin: 7.7 g/dL — ABNORMAL LOW (ref 13.0–17.0)
Immature Granulocytes: 0 %
Lymphocytes Relative: 25 %
Lymphs Abs: 0.7 10*3/uL (ref 0.7–4.0)
MCH: 23.1 pg — ABNORMAL LOW (ref 26.0–34.0)
MCHC: 30 g/dL (ref 30.0–36.0)
MCV: 77.2 fL — ABNORMAL LOW (ref 80.0–100.0)
Monocytes Absolute: 0.1 10*3/uL (ref 0.1–1.0)
Monocytes Relative: 4 %
Neutro Abs: 1.9 10*3/uL (ref 1.7–7.7)
Neutrophils Relative %: 68 %
Platelet Count: 100 10*3/uL — ABNORMAL LOW (ref 150–400)
RBC: 3.33 MIL/uL — ABNORMAL LOW (ref 4.22–5.81)
RDW: 21.9 % — ABNORMAL HIGH (ref 11.5–15.5)
WBC Count: 2.8 10*3/uL — ABNORMAL LOW (ref 4.0–10.5)
nRBC: 0.7 % — ABNORMAL HIGH (ref 0.0–0.2)

## 2018-07-26 LAB — CMP (CANCER CENTER ONLY)
ALT: 11 U/L (ref 0–44)
AST: 15 U/L (ref 15–41)
Albumin: 3.2 g/dL — ABNORMAL LOW (ref 3.5–5.0)
Alkaline Phosphatase: 51 U/L (ref 38–126)
Anion gap: 11 (ref 5–15)
BUN: 29 mg/dL — ABNORMAL HIGH (ref 8–23)
CO2: 27 mmol/L (ref 22–32)
Calcium: 8.3 mg/dL — ABNORMAL LOW (ref 8.9–10.3)
Chloride: 107 mmol/L (ref 98–111)
Creatinine: 1.09 mg/dL (ref 0.61–1.24)
GFR, Est AFR Am: >60 mL/min (ref >60)
GFR, Estimated: 59 mL/min — ABNORMAL LOW (ref >60)
Glucose, Bld: 104 mg/dL — ABNORMAL HIGH (ref 70–99)
Potassium: 4.3 mmol/L (ref 3.5–5.1)
Sodium: 145 mmol/L (ref 135–145)
Total Bilirubin: 0.4 mg/dL (ref 0.3–1.2)
Total Protein: 6.9 g/dL (ref 6.5–8.1)

## 2018-07-26 LAB — PREPARE RBC (CROSSMATCH)

## 2018-07-26 LAB — SAMPLE TO BLOOD BANK

## 2018-07-26 MED ORDER — EPOETIN ALFA 20000 UNIT/ML IJ SOLN
60000.0000 [IU] | Freq: Once | INTRAMUSCULAR | Status: AC
  Administered 2018-07-26: 60000 [IU] via SUBCUTANEOUS

## 2018-07-26 MED ORDER — EPOETIN ALFA 20000 UNIT/ML IJ SOLN
INTRAMUSCULAR | Status: AC
  Filled 2018-07-26: qty 1

## 2018-07-26 MED ORDER — EPOETIN ALFA 40000 UNIT/ML IJ SOLN
INTRAMUSCULAR | Status: AC
  Filled 2018-07-26: qty 1

## 2018-07-26 NOTE — Progress Notes (Signed)
Hgb 7.7 today-aransep given. Will transfuse 1 unit blood tomorrow at 2 pm. Patient and daughter are aware. He only wanted 1 unit. Called blood bank to confirm they saw orders for type and screen and prepare blood. Daughter requested we give him a UA cup to obtain sample at home before he comes in tomorrow-has issues with UTI and has had low grade temp.

## 2018-07-27 ENCOUNTER — Inpatient Hospital Stay: Payer: Medicare Other

## 2018-07-27 ENCOUNTER — Other Ambulatory Visit: Payer: Self-pay

## 2018-07-27 DIAGNOSIS — N189 Chronic kidney disease, unspecified: Secondary | ICD-10-CM | POA: Diagnosis not present

## 2018-07-27 DIAGNOSIS — D563 Thalassemia minor: Secondary | ICD-10-CM | POA: Diagnosis not present

## 2018-07-27 DIAGNOSIS — C9000 Multiple myeloma not having achieved remission: Secondary | ICD-10-CM

## 2018-07-27 DIAGNOSIS — D631 Anemia in chronic kidney disease: Secondary | ICD-10-CM | POA: Diagnosis not present

## 2018-07-27 DIAGNOSIS — D649 Anemia, unspecified: Secondary | ICD-10-CM

## 2018-07-27 DIAGNOSIS — D61818 Other pancytopenia: Secondary | ICD-10-CM | POA: Diagnosis not present

## 2018-07-27 LAB — URINALYSIS, COMPLETE (UACMP) WITH MICROSCOPIC
Bilirubin Urine: NEGATIVE
Glucose, UA: NEGATIVE mg/dL
Hgb urine dipstick: NEGATIVE
Ketones, ur: NEGATIVE mg/dL
Nitrite: POSITIVE — AB
Protein, ur: 30 mg/dL — AB
Specific Gravity, Urine: 1.017 (ref 1.005–1.030)
WBC, UA: >50 WBC/hpf — ABNORMAL HIGH (ref 0–5)
pH: 5 (ref 5.0–8.0)

## 2018-07-27 LAB — KAPPA/LAMBDA LIGHT CHAINS
Kappa free light chain: 1155.4 mg/L — ABNORMAL HIGH (ref 3.3–19.4)
Kappa, lambda light chain ratio: 152.03 — ABNORMAL HIGH (ref 0.26–1.65)
Lambda free light chains: 7.6 mg/L (ref 5.7–26.3)

## 2018-07-27 LAB — PROTEIN ELECTROPHORESIS, SERUM
A/G Ratio: 1.2 (ref 0.7–1.7)
Albumin ELP: 3.6 g/dL (ref 2.9–4.4)
Alpha-1-Globulin: 0.2 g/dL (ref 0.0–0.4)
Alpha-2-Globulin: 0.5 g/dL (ref 0.4–1.0)
Beta Globulin: 2 g/dL — ABNORMAL HIGH (ref 0.7–1.3)
Gamma Globulin: 0.2 g/dL — ABNORMAL LOW (ref 0.4–1.8)
Globulin, Total: 2.9 g/dL (ref 2.2–3.9)
M-Spike, %: 1.3 g/dL — ABNORMAL HIGH
Total Protein ELP: 6.5 g/dL (ref 6.0–8.5)

## 2018-07-27 LAB — IGA: IgA: 1670 mg/dL — ABNORMAL HIGH (ref 61–437)

## 2018-07-27 MED ORDER — SODIUM CHLORIDE 0.9% IV SOLUTION
250.0000 mL | Freq: Once | INTRAVENOUS | Status: AC
  Administered 2018-07-27: 15:00:00 250 mL via INTRAVENOUS
  Filled 2018-07-27: qty 250

## 2018-07-27 NOTE — Patient Instructions (Signed)

## 2018-07-28 ENCOUNTER — Emergency Department (HOSPITAL_COMMUNITY)
Admission: EM | Admit: 2018-07-28 | Discharge: 2018-07-28 | Disposition: A | Discharge status: Home / Self Care | Payer: Medicare Other | Attending: Emergency Medicine | Admitting: Emergency Medicine

## 2018-07-28 ENCOUNTER — Emergency Department (HOSPITAL_COMMUNITY): Payer: Medicare Other

## 2018-07-28 ENCOUNTER — Other Ambulatory Visit: Payer: Self-pay

## 2018-07-28 ENCOUNTER — Telehealth: Payer: Self-pay | Admitting: *Deleted

## 2018-07-28 ENCOUNTER — Encounter (HOSPITAL_COMMUNITY): Payer: Self-pay

## 2018-07-28 DIAGNOSIS — Z20828 Contact with and (suspected) exposure to other viral communicable diseases: Secondary | ICD-10-CM | POA: Diagnosis not present

## 2018-07-28 DIAGNOSIS — I2510 Atherosclerotic heart disease of native coronary artery without angina pectoris: Secondary | ICD-10-CM | POA: Insufficient documentation

## 2018-07-28 DIAGNOSIS — N3000 Acute cystitis without hematuria: Secondary | ICD-10-CM

## 2018-07-28 DIAGNOSIS — Z87891 Personal history of nicotine dependence: Secondary | ICD-10-CM | POA: Diagnosis not present

## 2018-07-28 DIAGNOSIS — Z950 Presence of cardiac pacemaker: Secondary | ICD-10-CM | POA: Insufficient documentation

## 2018-07-28 DIAGNOSIS — I130 Hypertensive heart and chronic kidney disease with heart failure and stage 1 through stage 4 chronic kidney disease, or unspecified chronic kidney disease: Secondary | ICD-10-CM | POA: Insufficient documentation

## 2018-07-28 DIAGNOSIS — R0902 Hypoxemia: Secondary | ICD-10-CM | POA: Insufficient documentation

## 2018-07-28 DIAGNOSIS — Z8579 Personal history of other malignant neoplasms of lymphoid, hematopoietic and related tissues: Secondary | ICD-10-CM | POA: Insufficient documentation

## 2018-07-28 DIAGNOSIS — I5042 Chronic combined systolic (congestive) and diastolic (congestive) heart failure: Secondary | ICD-10-CM | POA: Diagnosis not present

## 2018-07-28 DIAGNOSIS — R509 Fever, unspecified: Secondary | ICD-10-CM | POA: Diagnosis not present

## 2018-07-28 DIAGNOSIS — E1122 Type 2 diabetes mellitus with diabetic chronic kidney disease: Secondary | ICD-10-CM | POA: Diagnosis not present

## 2018-07-28 DIAGNOSIS — R05 Cough: Secondary | ICD-10-CM | POA: Insufficient documentation

## 2018-07-28 DIAGNOSIS — R0602 Shortness of breath: Secondary | ICD-10-CM | POA: Diagnosis not present

## 2018-07-28 DIAGNOSIS — N183 Chronic kidney disease, stage 3 (moderate): Secondary | ICD-10-CM | POA: Insufficient documentation

## 2018-07-28 DIAGNOSIS — J449 Chronic obstructive pulmonary disease, unspecified: Secondary | ICD-10-CM | POA: Insufficient documentation

## 2018-07-28 LAB — CBC WITH DIFFERENTIAL/PLATELET
Abs Immature Granulocytes: 0.01 10*3/uL (ref 0.00–0.07)
Basophils Absolute: 0 10*3/uL (ref 0.0–0.1)
Basophils Relative: 0 %
Eosinophils Absolute: 0 10*3/uL (ref 0.0–0.5)
Eosinophils Relative: 1 %
HCT: 31.5 % — ABNORMAL LOW (ref 39.0–52.0)
Hemoglobin: 9.5 g/dL — ABNORMAL LOW (ref 13.0–17.0)
Immature Granulocytes: 0 %
Lymphocytes Relative: 8 %
Lymphs Abs: 0.3 10*3/uL — ABNORMAL LOW (ref 0.7–4.0)
MCH: 24.1 pg — ABNORMAL LOW (ref 26.0–34.0)
MCHC: 30.2 g/dL (ref 30.0–36.0)
MCV: 79.7 fL — ABNORMAL LOW (ref 80.0–100.0)
Monocytes Absolute: 0.2 10*3/uL (ref 0.1–1.0)
Monocytes Relative: 5 %
Neutro Abs: 3.6 10*3/uL (ref 1.7–7.7)
Neutrophils Relative %: 86 %
Platelets: 88 10*3/uL — ABNORMAL LOW (ref 150–400)
RBC: 3.95 MIL/uL — ABNORMAL LOW (ref 4.22–5.81)
RDW: 21.2 % — ABNORMAL HIGH (ref 11.5–15.5)
WBC: 4.2 10*3/uL (ref 4.0–10.5)
nRBC: 0.5 % — ABNORMAL HIGH (ref 0.0–0.2)

## 2018-07-28 LAB — TYPE AND SCREEN
ABO/RH(D): A POS
Antibody Screen: NEGATIVE
Unit division: 0

## 2018-07-28 LAB — URINALYSIS, ROUTINE W REFLEX MICROSCOPIC
Bilirubin Urine: NEGATIVE
Glucose, UA: NEGATIVE mg/dL
Hgb urine dipstick: NEGATIVE
Ketones, ur: NEGATIVE mg/dL
Nitrite: POSITIVE — AB
Protein, ur: 30 mg/dL — AB
Specific Gravity, Urine: 1.016 (ref 1.005–1.030)
pH: 5 (ref 5.0–8.0)

## 2018-07-28 LAB — BPAM RBC
Blood Product Expiration Date: 202008012359
ISSUE DATE / TIME: 202006301435
Unit Type and Rh: 6200

## 2018-07-28 LAB — COMPREHENSIVE METABOLIC PANEL
ALT: 18 U/L (ref 0–44)
AST: 21 U/L (ref 15–41)
Albumin: 3.7 g/dL (ref 3.5–5.0)
Alkaline Phosphatase: 48 U/L (ref 38–126)
Anion gap: 12 (ref 5–15)
BUN: 31 mg/dL — ABNORMAL HIGH (ref 8–23)
CO2: 25 mmol/L (ref 22–32)
Calcium: 8.6 mg/dL — ABNORMAL LOW (ref 8.9–10.3)
Chloride: 102 mmol/L (ref 98–111)
Creatinine, Ser: 1.16 mg/dL (ref 0.61–1.24)
GFR calc Af Amer: >60 mL/min (ref >60)
GFR calc non Af Amer: 55 mL/min — ABNORMAL LOW (ref >60)
Glucose, Bld: 78 mg/dL (ref 70–99)
Potassium: 4 mmol/L (ref 3.5–5.1)
Sodium: 139 mmol/L (ref 135–145)
Total Bilirubin: 0.7 mg/dL (ref 0.3–1.2)
Total Protein: 7.4 g/dL (ref 6.5–8.1)

## 2018-07-28 LAB — SARS CORONAVIRUS 2 BY RT PCR (HOSPITAL ORDER, PERFORMED IN ~~LOC~~ HOSPITAL LAB): SARS Coronavirus 2: NEGATIVE

## 2018-07-28 LAB — LACTIC ACID, PLASMA: Lactic Acid, Venous: 1.2 mmol/L (ref 0.5–1.9)

## 2018-07-28 LAB — LIPASE, BLOOD: Lipase: 28 U/L (ref 11–51)

## 2018-07-28 LAB — TROPONIN I (HIGH SENSITIVITY): Troponin I (High Sensitivity): 17 ng/L (ref <18)

## 2018-07-28 MED ORDER — SODIUM CHLORIDE 0.9% FLUSH: 3.0000 mL | Freq: Once | INTRAVENOUS | Status: DC

## 2018-07-28 MED ORDER — CEPHALEXIN 500 MG PO CAPS: 500.0000 mg | ORAL_CAPSULE | Freq: Two times a day (BID) | ORAL | 0 refills | Status: AC

## 2018-07-28 MED ORDER — CEPHALEXIN 500 MG PO CAPS: 500.0000 mg | ORAL_CAPSULE | Freq: Two times a day (BID) | ORAL | 0 refills | Status: DC

## 2018-07-28 MED ORDER — SODIUM CHLORIDE 0.9 % IV SOLN
1.0000 g | Freq: Once | INTRAVENOUS | Status: AC
  Administered 2018-07-28: 1 g via INTRAVENOUS
  Filled 2018-07-28: qty 10

## 2018-07-28 NOTE — ED Provider Notes (Signed)
Emergency Department Provider Note   I have reviewed the triage vital signs and the nursing notes.   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  Symptoms began today.  Patient had blood transfusion yesterday and states he felt well both during and after the transfusion.  Denies abdominal pain, vomiting, diarrhea.  No chest pain, heart palpitations.  Patient denies any sick contacts.  Denies any dysuria, hesitancy, urgency.  He is not on chemotherapy at this time. No radiation of symptoms or modifying factors.   Past Medical History:  Diagnosis Date  . Anemia, unspecified   . CAD (coronary artery disease)   . Cataracts, bilateral   . CHF (congestive heart failure) ([place])   . Chronic airway obstruction, not elsewhere classified    on o2 at night  . Diabetes mellitus   . Diverticulosis of colon (without mention of hemorrhage)   . Dyslipidemia   . Emphysema   . Hemorrhoids   . ICD (implantable cardiac defibrillator) in place    st jude  . Ischemic cardiomyopathy   . Macular degeneration   . MI, old 2010 or 2011  . Prostatic hypertrophy    hx of  . Retention of urine, unspecified   . Unspecified disorder resulting from impaired renal function     Patient Active Problem List   Diagnosis Date Noted  . Goals of care, counseling/discussion 01/01/2018  . Influenza-like illness   . Neutropenia with fever ([place])   . Emphysema 02/01/2017  . Multiple myeloma ([place]) 02/01/2017  . Prostatic hypertrophy   . MI, old   . Macular degeneration   . Hemorrhoids   . Dyslipidemia   . Cataracts, bilateral   . CAD (coronary artery disease)   . Cough   . Pancytopenia ([place])   . Acute respiratory failure with hypoxemia ([place]) 11/27/2016  .  Physical deconditioning   . Post-nasal drip   . Acute on chronic respiratory failure with hypoxia ([place]) 11/10/2016  . Acute renal failure superimposed on chronic kidney disease ([place]) 10/29/2016  . CAP (community acquired pneumonia) 10/29/2016  . Acute on chronic respiratory acidosis 10/29/2016  . Lobar pneumonia ([place])   . Chronic diastolic CHF (congestive heart failure) ([place])   . Chronic respiratory failure with hypoxia ([place]) 10/24/2016  . Presbyesophagus   . Acute on chronic respiratory failure ([place])   . Microcytic anemia   . Dysphagia   . Cardiomyopathy, ischemic   . Sepsis ([place]) 06/11/2015  . COPD with exacerbation ([place]) 11/21/2013  . Diverticulitis of colon with perforation 11/21/2013  . Severe sepsis ([place]) 11/21/2013  . Adynamic ileus ([place]) 11/21/2013  . COPD with acute exacerbation ([place]) 11/21/2013  . UTI (urinary tract infection), bacterial 11/21/2013  . Severe protein-calorie malnutrition ([place]) 11/21/2013  . Hypernatremia 11/17/2013  . Preop pulmonary/respiratory exam 11/15/2013  . Preoperative respiratory examination 11/14/2013  . AKI (acute kidney injury) ([place]) 07/01/2013  . Low back pain 07/01/2013  . UTI (urinary tract infection) 07/01/2013  . Chronic systolic HF (heart failure) ([place]) 07/01/2013  . Dysphagia, unspecified(787.20) 10/27/2012  . Acute on chronic renal insufficiency 03/12/2012  . MGUS (monoclonal gammopathy of unknown significance) 03/12/2012  . Thrombocytopenia ([place]) 03/12/2012  . Beta thalassemia trait 03/12/2012  . UTI (lower urinary tract infection) 03/12/2012  . Gait  instability 03/12/2012  . Fall at home 03/12/2012  . Automatic implantable cardioverter-defibrillator in situ 12/09/2011  . Ischemic cardiomyopathy 04/14/2011  . Cyanocobalamin deficiency 12/19/2010  . CHF (congestive heart failure) ([place]) 10/09/2010  . Edema 07/08/2010  . Dyspnea 07/08/2010  . CARDIAC ARREST 04/04/2010  . CONDUCTIVE HEARING LOSS OF COMBINED TYPES 10/24/2009  .  KNEE PAIN 08/07/2009  . MI 06/07/2009  . DIABETES MELLITUS, TYPE II 06/06/2009  . DYSLIPIDEMIA 06/06/2009  . Anemia 06/06/2009  . Coronary atherosclerosis 06/06/2009  . Asthma vs vcd 06/06/2009  . DIVERTICULAR DISEASE 06/06/2009  . Chronic kidney disease, stage 3 ([place]) 06/06/2009  . URINARY RETENTION 06/06/2009  . BENIGN PROSTATIC HYPERTROPHY, HX OF 06/06/2009    Past Surgical History:  Procedure Laterality Date  . CARDIAC DEFIBRILLATOR PLACEMENT  2011   st jude  . CATARACT EXTRACTION    . PACEMAKER INSERTION  2011  . pci     4/11  . TONSILLECTOMY  85 yo    Allergies Patient has no known allergies.  Family History  Problem Relation Age of Onset  . Coronary artery disease Mother   . Stroke Mother   . Congestive Heart Failure Mother   . Lung cancer Father        smoker  . Breast cancer Sister   . Atrial fibrillation Daughter   . Heart attack Brother     Social History Social History   Tobacco Use  . Smoking status: Former Smoker    Types: Cigarettes    Quit date: 04/27/2009    Years since quitting: 9.2  . Smokeless tobacco: Never Used  Substance Use Topics  . Alcohol use: Yes    Comment: occasional beer  . Drug use: No    Review of Systems  Constitutional: Positive fever/chills Eyes: No visual changes. ENT: No sore throat. Cardiovascular: Denies chest pain. Respiratory: Denies shortness of breath. Positive cough. Low O2 sat at home.  Gastrointestinal: No abdominal pain.  No nausea, no vomiting.  No diarrhea.  No constipation. Genitourinary: Negative for dysuria. Musculoskeletal: Negative for back pain. Skin: Negative for rash. Neurological: Negative for headaches, focal weakness or numbness.  10-point ROS otherwise negative.  ____________________________________________   PHYSICAL EXAM:  VITAL SIGNS: ED Triage Vitals [07/28/18 1403]  Enc Vitals Group     BP 125/72     Pulse Rate 88     Resp 16     Temp 99.2 F (37.3 C)     Temp Source Oral      SpO2 95 %     Weight 147 lb 0.8 oz (66.7 kg)   Constitutional: Alert and oriented. Well appearing and in no acute distress. Eyes: Conjunctivae are normal.  Head: Atraumatic. Nose: No congestion/rhinnorhea. Mouth/Throat: Mucous membranes are moist.  Neck: No stridor.   Cardiovascular: Normal rate, regular rhythm. Good peripheral circulation. Grossly normal heart sounds.   Respiratory: Normal respiratory effort.  No retractions. Lungs CTAB. Gastrointestinal: Soft and nontender. No distention.  Musculoskeletal: No lower extremity tenderness nor edema. No gross deformities of extremities. Neurologic:  Normal speech and language. No gross focal neurologic deficits are appreciated.  Skin:  Skin is warm, dry and intact. No rash noted.  ____________________________________________   LABS (all labs ordered are listed, but only abnormal results are displayed)  Labs Reviewed  COMPREHENSIVE METABOLIC PANEL - Abnormal; Notable for the following components:      Result Value   BUN 31 (*)    Calcium 8.6 (*)    GFR calc non Af Wyvonnia Lora  55 (*)    All other components within normal limits  CBC WITH DIFFERENTIAL/PLATELET - Abnormal; Notable for the following components:   RBC 3.95 (*)    Hemoglobin 9.5 (*)    HCT 31.5 (*)    MCV 79.7 (*)    MCH 24.1 (*)    RDW 21.2 (*)    Platelets 88 (*)    nRBC 0.5 (*)    Lymphs Abs 0.3 (*)    All other components within normal limits  URINALYSIS, ROUTINE W REFLEX MICROSCOPIC - Abnormal; Notable for the following components:   APPearance HAZY (*)    Protein, ur 30 (*)    Nitrite POSITIVE (*)    Leukocytes,Ua MODERATE (*)    Bacteria, UA MANY (*)    All other components within normal limits  CULTURE, BLOOD (ROUTINE X 2)  CULTURE, BLOOD (ROUTINE X 2)  SARS CORONAVIRUS 2 (HOSPITAL ORDER, [facility])  URINE CULTURE  LACTIC ACID, PLASMA  LIPASE, BLOOD  TROPONIN I (HIGH SENSITIVITY)    ____________________________________________  EKG   EKG Interpretation  Date/Time:  Wednesday July 28 2018 16:25:03 EDT Ventricular Rate:  73 PR Interval:    QRS Duration: 101 QT Interval:  402 QTC Calculation: 443 R Axis:   82 Text Interpretation:  Sinus rhythm Multiple premature complexes, vent & supraven Borderline right axis deviation Probable anteroseptal infarct, old Nonspecific T abnormalities, lateral leads No STEMI  Confirmed by Nanda Quinton [id number]) on 07/28/2018 4:35:06 PM       ____________________________________________  RADIOLOGY  Dg Chest Portable 1 View  Result Date: 07/28/2018 CLINICAL DATA:  Fever and shortness of breath EXAM: PORTABLE CHEST 1 VIEW COMPARISON:  July 20, 2017 FINDINGS: A left-sided dual chamber pacemaker is again noted. The heart size is stable. The thoracic aorta appears enlarged. The lungs are hyperexpanded with emphysematous changes bilaterally. There is no pneumothorax. No large pleural effusion. Aortic calcifications are noted. There is no acute osseous abnormality. There are advanced degenerative changes of both glenohumeral joints. IMPRESSION: 1. No acute cardiopulmonary process. 2. Hyperexpanded lungs with emphysematous changes bilaterally. 3. Tortuous and enlarged thoracic aorta. Electronically Signed   By: Constance Holster M.D.   On: 07/28/2018 17:03    ____________________________________________   PROCEDURES  Procedure(s) performed:   Procedures  None ____________________________________________   INITIAL IMPRESSION / ASSESSMENT AND PLAN / ED COURSE  Pertinent labs & imaging results that were available during my care of the patient were reviewed by me and considered in my medical decision making (see chart for details).   Presents to the emergency department for evaluation of fever with low oxygen reading at home and chills.  Patient with COPD and apparently does wear oxygen at night only.  Patient apparently had urinalysis  sent to the lab yesterday.  At that time had large leukocytes, positive nitrite, and many bacteria.  No hypoxemia here.  Will obtain screening labs for sepsis.  Lower suspicion for COVID-19 but do plan on testing given fever at home.   Patient with evidence of UTI which likely explains fever. COVID negative. Labs and imaging reviewed. Discussed O2 requirement with daughter by phone. He intermittently needs home O2 during the day and she reports this isn't so unusual. They will continue to monitor at home and return with any respiratory symptoms. Patient without SOB symptoms and is very eager to return home. Plan for discharge.  ____________________________________________  FINAL CLINICAL IMPRESSION(S) / ED DIAGNOSES  Final diagnoses:  Fever, unspecified fever cause  Acute cystitis without hematuria     MEDICATIONS GIVEN DURING THIS VISIT:  Medications  cefTRIAXone (ROCEPHIN) 1 g in sodium chloride 0.9 % 100 mL IVPB (0 g Intravenous Stopped 07/28/18 1750)     NEW OUTPATIENT MEDICATIONS STARTED DURING THIS VISIT:  Discharge Medication List as of 07/28/2018  5:14 PM    START taking these medications   Details  cephALEXin (KEFLEX) 500 MG capsule Take 1 capsule (500 mg total) by mouth 2 (two) times daily for 7 days., Starting Wed 07/28/2018, Until Wed 08/04/2018, Normal        Note:  This document was prepared using Dragon voice recognition software and may include unintentional dictation errors.  Nanda Quinton, MD Emergency Medicine    , Wonda [facility], MD 07/29/18 1024

## 2018-07-28 NOTE — Telephone Encounter (Signed)
Dr. [name] made aware of prior conversations with daughter and agrees he needs to go to emergency room now.

## 2018-07-28 NOTE — Discharge Instructions (Signed)
You were seen in the emergency department today with fever.  You have a urinary tract infection.  Please take the antibiotics as prescribed.  Your oxygen levels did go down at times and you required oxygen here.  Please continue to monitor these at home and use your home oxygen as needed along with your other COPD medications.  Return to the emergency department with any shortness of breath, chest pain, new or worsening symptoms.

## 2018-07-28 NOTE — ED Notes (Signed)
Pt called out to report that he needed to void.  Pt reported that he self catheterizes at home.  This RN offered to assist pt with in/out cath using hospital provided equipment.  Pt refused hospital supplies, insistent that he used home catheter.  RN informed him that he would be required to use his own supplies independently.  Pt verbalized understanding, performed self catheterization independently using aseptic technique.  RN collected UA sample from self cath.

## 2018-07-28 NOTE — ED Triage Notes (Signed)
Pt states that he had a transfusion yesterday. Pt states that today when he woke up he was very cold and his pulse ox was irregular. Pt's tempt at 1300 was 100.6. Tylenol taken PTA.

## 2018-07-28 NOTE — Telephone Encounter (Signed)
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  Received injection yesterday after lab and urine check.  He says he has chills and shortness of breath.  Please call me."   "Temp = 98.0  this morning.  Chills started last night.  Waiting for urine test results.  He's 85 years old and needs antibiotic sent to [facility].  He is having a lot of problems expelling mucus.  Wears oxygen at night and uses inhalers for COPD.  His home number is [phone] if you need to call him."

## 2018-07-28 NOTE — Telephone Encounter (Signed)
FYI "Was just talking with someone.  Just found out his temperature is 100.6 and his oxygen saturation is bouncing all over the place."   "Oxygen saturation = 76 %, eighty something percent and 95%.  He is not sweating and is not having diarrhea."  Advised to report to ED for evaluation of shortness of breath and oxygen desaturation with this call.  Advised of no visitor policy when asked if able to enter ED.

## 2018-07-28 NOTE — ED Notes (Signed)
While supervising pt self catheterization, pt was on room air.  O2 saturations on room air when lying in bed were 95%.  Pt then stood to remove pants, and sat on edge of bed to self-cath.  Self-catheterization took approximately 5 minutes.  Once catheterization was complete, pt was assisted back into bed.  Pts O2 sats on room air then observed to be 82%, with audible expiratory wheezing.    Pt placed on 4L O2 by [place], O2 sats returned to 100%.  MD made aware, will continue to monitor.

## 2018-07-30 LAB — URINE CULTURE
Culture: >=100000 — AB
Special Requests: NORMAL

## 2018-07-31 ENCOUNTER — Telehealth: Payer: Self-pay | Admitting: Emergency Medicine

## 2018-07-31 NOTE — Telephone Encounter (Signed)
Post ED Visit - Positive Culture Follow-up  Culture report reviewed by antimicrobial stewardship pharmacist: [place] Team []  Elenor Quinones, Pharm.D. []  Heide Guile, Pharm.D., BCPS AQ-ID []  [place] Neptune, Pharm.D., BCPS []  Alycia Rossetti, Pharm.D., BCPS []  [place], Pharm.D., BCPS, AAHIVP []  Legrand Como, Pharm.D., BCPS, AAHIVP []  Salome Arnt, PharmD, BCPS []  Johnnette Gourd, PharmD, BCPS []  Hughes Better, PharmD, BCPS []  Leeroy Cha, PharmD []  Laqueta Linden, PharmD, BCPS []  Albertina Parr, PharmD  [place] Team []  Leodis Sias, PharmD []  Lindell Spar, PharmD []  Royetta Asal, PharmD []  Graylin Shiver, Rph []  Rema Fendt) Glennon Mac, PharmD []  Arlyn Dunning, PharmD []  Netta Cedars, PharmD []  Dia Sitter, PharmD []  Leone Haven, PharmD []  Gretta Arab, PharmD [x]  Theodis Shove, PharmD []  Peggyann Juba, PharmD []  Reuel Boom, PharmD   Positive urine culture Treated with Cephalexin, organism sensitive to the same and no further patient follow-up is required at this time.  Sandi Raveling Leiah Giannotti 07/31/2018, 12:47 PM

## 2018-08-02 LAB — CULTURE, BLOOD (ROUTINE X 2)
Culture: NO GROWTH
Culture: NO GROWTH

## 2018-08-09 ENCOUNTER — Inpatient Hospital Stay (HOSPITAL_BASED_OUTPATIENT_CLINIC_OR_DEPARTMENT_OTHER): Payer: Medicare Other | Admitting: Nurse Practitioner

## 2018-08-09 ENCOUNTER — Other Ambulatory Visit: Payer: Self-pay

## 2018-08-09 ENCOUNTER — Inpatient Hospital Stay: Payer: Medicare Other

## 2018-08-09 ENCOUNTER — Encounter: Payer: Self-pay | Admitting: Nurse Practitioner

## 2018-08-09 ENCOUNTER — Inpatient Hospital Stay: Payer: Medicare Other | Attending: Oncology

## 2018-08-09 VITALS — BP 125/70 | HR 70 | Temp 99.1°F | Resp 17 | Ht 72.0 in | Wt 147.3 lb

## 2018-08-09 DIAGNOSIS — C9000 Multiple myeloma not having achieved remission: Secondary | ICD-10-CM | POA: Insufficient documentation

## 2018-08-09 DIAGNOSIS — N400 Enlarged prostate without lower urinary tract symptoms: Secondary | ICD-10-CM | POA: Insufficient documentation

## 2018-08-09 DIAGNOSIS — D61818 Other pancytopenia: Secondary | ICD-10-CM | POA: Insufficient documentation

## 2018-08-09 DIAGNOSIS — N189 Chronic kidney disease, unspecified: Secondary | ICD-10-CM | POA: Diagnosis not present

## 2018-08-09 DIAGNOSIS — D631 Anemia in chronic kidney disease: Secondary | ICD-10-CM | POA: Insufficient documentation

## 2018-08-09 DIAGNOSIS — E538 Deficiency of other specified B group vitamins: Secondary | ICD-10-CM

## 2018-08-09 DIAGNOSIS — D563 Thalassemia minor: Secondary | ICD-10-CM | POA: Insufficient documentation

## 2018-08-09 LAB — URINALYSIS, COMPLETE (UACMP) WITH MICROSCOPIC
Bilirubin Urine: NEGATIVE
Glucose, UA: NEGATIVE mg/dL
Hgb urine dipstick: NEGATIVE
Ketones, ur: NEGATIVE mg/dL
Nitrite: NEGATIVE
Protein, ur: 30 mg/dL — AB
Specific Gravity, Urine: 1.018 (ref 1.005–1.030)
WBC, UA: >50 WBC/hpf — ABNORMAL HIGH (ref 0–5)
pH: 5 (ref 5.0–8.0)

## 2018-08-09 LAB — CBC WITH DIFFERENTIAL (CANCER CENTER ONLY)
Abs Immature Granulocytes: 0.01 10*3/uL (ref 0.00–0.07)
Basophils Absolute: 0 10*3/uL (ref 0.0–0.1)
Basophils Relative: 0 %
Eosinophils Absolute: 0.1 10*3/uL (ref 0.0–0.5)
Eosinophils Relative: 4 %
HCT: 29.6 % — ABNORMAL LOW (ref 39.0–52.0)
Hemoglobin: 8.9 g/dL — ABNORMAL LOW (ref 13.0–17.0)
Immature Granulocytes: 0 %
Lymphocytes Relative: 23 %
Lymphs Abs: 0.7 10*3/uL (ref 0.7–4.0)
MCH: 24.1 pg — ABNORMAL LOW (ref 26.0–34.0)
MCHC: 30.1 g/dL (ref 30.0–36.0)
MCV: 80.2 fL (ref 80.0–100.0)
Monocytes Absolute: 0.1 10*3/uL (ref 0.1–1.0)
Monocytes Relative: 4 %
Neutro Abs: 2.1 10*3/uL (ref 1.7–7.7)
Neutrophils Relative %: 69 %
Platelet Count: 108 10*3/uL — ABNORMAL LOW (ref 150–400)
RBC: 3.69 MIL/uL — ABNORMAL LOW (ref 4.22–5.81)
RDW: 21.4 % — ABNORMAL HIGH (ref 11.5–15.5)
WBC Count: 3.1 10*3/uL — ABNORMAL LOW (ref 4.0–10.5)
nRBC: 0 % (ref 0.0–0.2)

## 2018-08-09 MED ORDER — EPOETIN ALFA 20000 UNIT/ML IJ SOLN
60000.0000 [IU] | Freq: Once | INTRAMUSCULAR | Status: AC
  Administered 2018-08-09: 60000 [IU] via SUBCUTANEOUS

## 2018-08-09 MED ORDER — EPOETIN ALFA 20000 UNIT/ML IJ SOLN
INTRAMUSCULAR | Status: AC
  Filled 2018-08-09: qty 1

## 2018-08-09 MED ORDER — PREDNISONE 20 MG PO TABS: 20.0000 mg | ORAL_TABLET | Freq: Every day | ORAL | 0 refills | Status: DC

## 2018-08-09 MED ORDER — EPOETIN ALFA 40000 UNIT/ML IJ SOLN
INTRAMUSCULAR | Status: AC
  Filled 2018-08-09: qty 1

## 2018-08-09 NOTE — Progress Notes (Addendum)
[facility] OFFICE PROGRESS NOTE   Diagnosis: Multiple myeloma  INTERVAL HISTORY:   [name] [name] returns as scheduled.  [name] continues erythropoietin.  [name] was last transfused a unit of blood on 07/27/2018 for a hemoglobin of 7.7.  [name] was seen in the emergency department on 07/28/2018 for evaluation of fever.  COVID test was negative.  [name] had evidence of a urinary tract infection which was felt to likely explain the fever.  [name] received Rocephin in the emergency department and was discharged home on cephalexin.  The urine culture returned positive for E. Coli.  [name] did not have any urinary symptoms at the time of the fever.  [name] has had no further fever.  [name] has shortness of breath periodically.  [name] utilizes oxygen at nighttime.  Objective:  Vital signs in last 24 hours:  Blood pressure 125/70, pulse 70, temperature 99.1 F (37.3 C), temperature source Oral, resp. rate 17, height 6' (1.829 m), weight 147 lb 4.8 oz (66.8 kg), SpO2 93 %.   Limited examination due to COVID-19 distancing HEENT: No thrush or ulcers. Vascular: Trace edema at the lower legs bilaterally. Neuro: Alert and oriented.   Lab Results:  Lab Results  Component Value Date   WBC 3.1 (L) 08/09/2018   HGB 8.9 (L) 08/09/2018   HCT 29.6 (L) 08/09/2018   MCV 80.2 08/09/2018   PLT 108 (L) 08/09/2018   NEUTROABS 2.1 08/09/2018    Imaging:  No results found.  Medications: I have reviewed the patient's current medications.  Assessment/Plan: 1.Pancytopenia-transfused with packed red blood cells 11/28/2016, 12/27/2016, 01/13/2017, 02/02/2017 2. History of beta thalassemia trait 3. History of vitamin B-12 deficiency-vitamin B-12 replacement initiated 11/11/2016 4. History of a serum monoclonal IgAkappaprotein-elevated serum free kappa light chains, IgA, and M spike10/16/2018  Bone marrow biopsy 12/03/2016-consistent with multiple myeloma  Bone survey 12/06/2016-no destructive or lytic bone lesions  identified.  Revlimid 21 days on/7 days off and weekly dexamethasone 12/16/2016(Decadron discontinued 12/29/2016 secondary to altered mental status)  Cycle 2 single agent Revlimid 01/14/2017(discontinued 3 days early)  Serum light chains, IgAimproved 02/02/2017  Cycle3 single agent Revlimid beginning 02/16/2017  Cycle4single agent Revlimid beginning 03/16/2017  Cycle 5 single agent Revlimid beginning 04/14/2017(Revlimid placed on hold beginning 04/28/2017 due to thrombocytopenia)  Cycle 6 single agent Revlimid beginning 05/23/2017 x 14 days  Cycle 1 melphalan/prednisone 08/16/2018 5. COPD with acute and chronic respiratory failure/hypoxemia 6. History of coronary artery disease 7. Ischemic cardiomyopathy 8. ICD in place 9. Prostatic hypertrophy 10.Inappropriately low erythropoietin level 11/28/2016 11. Severe pancytopenia secondary to multiple myeloma and Revlimid-improved 12. Fever-likely related to an upper respiratory infection, RSV positive swab 02/02/2017 13.Anemia secondary to renal insufficiency-trial of weekly erythropoietin initiated 06/17/2017.Erythropoietin dose increased to 60,000 units weekly beginning 07/28/2017.  Erythropoietin changed to every 2 weeks beginning8/14/2019  Disposition: [name] [name] appears unchanged.  [name] is currently maintained off of specific therapy for myeloma.  [name] receives erythropoietin every 2 weeks.  [name] continues to have transfusion dependent anemia.  Most recent serum markers of myeloma were increased.  [name] discussed treatment options with [name] [name].  In the past [name] has declined treatment that can cause neuropathy.  [name] recommends melphalan/prednisone daily for 4 days of a 28-day cycle.  We reviewed potential toxicities associated with melphalan including bone marrow toxicity, mouth sores, nausea, diarrhea.  [name] agrees to proceed.  We anticipate [name] will begin cycle 1 melphalan/prednisone 08/16/2018.  We will see him in follow-up on  09/09/2018.  Erythropoietin will be  continued every 2 weeks.  [name] will contact the office prior to his next appointment with any problems.  Patient seen with [name].   [name] [name] ANP/GNP-BC   08/09/2018  3:48 PM  This was a shared visit with [name] [name].  [name] [name] has persistent anemia and thrombocytopenia.  The myeloma markers have increased.  We discussed treatment options with [name] [name].  We will contact his daughter to discuss treatment recommendations.  I recommend a trial of melphalan/prednisone.  We reviewed potential toxicities associated with this regimen including the chance of hematologic toxicity.  [name] agrees to proceed.  The plan is to begin cycle 1 melphalan/prednisone on 08/16/2018.  [name] Manson, MD

## 2018-08-09 NOTE — Patient Instructions (Signed)
Epoetin Alfa injection °What is this medicine? °EPOETIN ALFA (e POE e tin AL fa) helps your body make more red blood cells. This medicine is used to treat anemia caused by chronic kidney disease, cancer chemotherapy, or HIV-therapy. It may also be used before surgery if you have anemia. °This medicine may be used for other purposes; ask your health care provider or pharmacist if you have questions. °COMMON BRAND NAME(S): Epogen, Procrit, Retacrit °What should I tell my health care provider before I take this medicine? °They need to know if you have any of these conditions: °-cancer °-heart disease °-high blood pressure °-history of blood clots °-history of stroke °-low levels of folate, iron, or vitamin B12 in the blood °-seizures °-an unusual or allergic reaction to erythropoietin, albumin, benzyl alcohol, hamster proteins, other medicines, foods, dyes, or preservatives °-pregnant or trying to get pregnant °-breast-feeding °How should I use this medicine? °This medicine is for injection into a vein or under the skin. It is usually given by a health care professional in a hospital or clinic setting. °If you get this medicine at home, you will be taught how to prepare and give this medicine. Use exactly as directed. Take your medicine at regular intervals. Do not take your medicine more often than directed. °It is important that you put your used needles and syringes in a special sharps container. Do not put them in a trash can. If you do not have a sharps container, call your pharmacist or healthcare provider to get one. °A special MedGuide will be given to you by the pharmacist with each prescription and refill. Be sure to read this information carefully each time. °Talk to your pediatrician regarding the use of this medicine in children. While this drug may be prescribed for selected conditions, precautions do apply. °Overdosage: If you think you have taken too much of this medicine contact a poison control center  or emergency room at once. °NOTE: This medicine is only for you. Do not share this medicine with others. °What if I miss a dose? °If you miss a dose, take it as soon as you can. If it is almost time for your next dose, take only that dose. Do not take double or extra doses. °What may interact with this medicine? °Interactions have not been studied. °This list may not describe all possible interactions. Give your health care provider a list of all the medicines, herbs, non-prescription drugs, or dietary supplements you use. Also tell them if you smoke, drink alcohol, or use illegal drugs. Some items may interact with your medicine. °What should I watch for while using this medicine? °Your condition will be monitored carefully while you are receiving this medicine. °You may need blood work done while you are taking this medicine. °This medicine may cause a decrease in vitamin B6. You should make sure that you get enough vitamin B6 while you are taking this medicine. Discuss the foods you eat and the vitamins you take with your health care professional. °What side effects may I notice from receiving this medicine? °Side effects that you should report to your doctor or health care professional as soon as possible: °-allergic reactions like skin rash, itching or hives, swelling of the face, lips, or tongue °-seizures °-signs and symptoms of a blood clot such as breathing problems; changes in vision; chest pain; severe, sudden headache; pain, swelling, warmth in the leg; trouble speaking; sudden numbness or weakness of the face, arm or leg °-signs and symptoms of a stroke   like changes in vision; confusion; trouble speaking or understanding; severe headaches; sudden numbness or weakness of the face, arm or leg; trouble walking; dizziness; loss of balance or coordination °Side effects that usually do not require medical attention (report to your doctor or health care professional if they continue or are  bothersome): °-chills °-cough °-dizziness °-fever °-headaches °-joint pain °-muscle cramps °-muscle pain °-nausea, vomiting °-pain, redness, or irritation at site where injected °This list may not describe all possible side effects. Call your doctor for medical advice about side effects. You may report side effects to FDA at 1-800-FDA-1088. °Where should I keep my medicine? °Keep out of the reach of children. °Store in a refrigerator between 2 and 8 degrees C (36 and 46 degrees F). Do not freeze or shake. Throw away any unused portion if using a single-dose vial. Multi-dose vials can be kept in the refrigerator for up to 21 days after the initial dose. Throw away unused medicine. °NOTE: This sheet is a summary. It may not cover all possible information. If you have questions about this medicine, talk to your doctor, pharmacist, or health care provider. °© 2019 Elsevier/Gold Standard (2016-08-22 08:35:19) ° °

## 2018-08-10 ENCOUNTER — Telehealth: Payer: Self-pay | Admitting: Nurse Practitioner

## 2018-08-10 ENCOUNTER — Telehealth: Payer: Self-pay | Admitting: *Deleted

## 2018-08-10 ENCOUNTER — Telehealth: Payer: Self-pay | Admitting: Pharmacist

## 2018-08-10 ENCOUNTER — Other Ambulatory Visit: Payer: Self-pay | Admitting: Nurse Practitioner

## 2018-08-10 DIAGNOSIS — C9000 Multiple myeloma not having achieved remission: Secondary | ICD-10-CM

## 2018-08-10 DIAGNOSIS — N390 Urinary tract infection, site not specified: Secondary | ICD-10-CM

## 2018-08-10 MED ORDER — ONDANSETRON HCL 8 MG PO TABS: 4.0000 mg | ORAL_TABLET | Freq: Three times a day (TID) | ORAL | 0 refills | Status: DC | PRN

## 2018-08-10 MED ORDER — MELPHALAN 2 MG PO TABS: ORAL_TABLET | ORAL | 0 refills | Status: DC

## 2018-08-10 MED ORDER — PREDNISONE 20 MG PO TABS: 40.0000 mg | ORAL_TABLET | Freq: Every day | ORAL | 0 refills | Status: DC

## 2018-08-10 NOTE — Telephone Encounter (Signed)
Oral Oncology Pharmacist Encounter  Received new prescription for melphalan for the palliative treatment of mutilple myeloma not having achieved remission in conjunction with prednisone, planned duration until adequate disease control or unacceptable toxicity.  [name] dose has been reduced by 50% to 0.12 mg/kg/day (8mg  once daily) to be taken for 4 days on, 24 days off, planned to be repeated every 28 days as long as CBC is adequate  Labs from Epic assessed, OK for treatment initiation. Noted platelet count=108k on 08/09/18, will continue to be monitored 07/28/18 SCr=1.16, est CrCl~ 35-40 mL/min (age=89, wt=66.8kg), melphalan dose has already been reduced for increased tolerance  Current medication list in Epic reviewed, no DDIs with melphalan and prednisone identified.  Prescriptions for melphalan and prednisone have been e-scribed to the [facility] for benefits analysis and approval. Ondansetron will be used for antinausea prophylaxis after discussion with MD. Prescription for ondansetron has also been sent to [facility].  Oral Oncology Clinic will continue to follow for insurance authorization, copayment issues, initial counseling and start date.  Johny Drilling, PharmD, BCPS, BCOP  08/10/2018 11:31 AM Oral Oncology Clinic [phone]

## 2018-08-10 NOTE — Telephone Encounter (Signed)
Called and spoke with patients daughter. Confirmed all upcoming appt

## 2018-08-10 NOTE — Telephone Encounter (Addendum)
Left VM again requesting oncology order antibiotic for him now when culture returns and asking if he could then be put back on Trimethoprim 100 mg/day as he was in past which kept him from having recurrent UTIs. She acknowledges that Dr. [name] wanted her to go to urologist, but she states she does not want to send him to another MD office unattended with COVID situation. Wants to talk with Lattie Haw or Dr. [name]. Per Dr. [name]: Will f/u on culture results and decide on antibiotic to treat current UTI. Future suppressive therapy should be managed by PCP or urologist and suggest asking to have a video visit so he won't have to come in personally. She understands and will seek out PCP for this discussion. Informed her I will call her and Mr. Economou with culture results tomorrow.

## 2018-08-10 NOTE — Telephone Encounter (Signed)
Oral Chemotherapy Pharmacist Encounter   I spoke with patient's daughter, [name], for overview of new oral chemotherapy medication: melphalan for the palliative treatment of mutilple myeloma not having achieved remission in conjunction with prednisone, planned duration until adequate disease control or unacceptable toxicity..   Counseled on administration, dosing, side effects, monitoring, drug-food interactions, safe handling, storage, and disposal.  Patient will take melphalan 2mg  tablets, 4 tablets (8 mg) by mouth once daily, taken on an empty stomach 1 hour before or 2 hours after food.  [name] will be taken for 4 days on, 24 days off, repeated every 28 days.  Prednisone will be administered at 40 mg once daily on the same cycle schedule of melphalan.  [name] is associated with a moderate emetogenic potential. Prednisone will be administered with breakfast, followed by ondansetron taken at 8 mg for antinausea prophylaxis 30-60 min prior to melphalan administration, and melphalan taken 2 hours after food.  [name] and prednisone start date: 08/16/18  Side effects include but not limited to: fatigue, nausea, vomiting, diarrhea, and decreased blood cell counts (RBC, WBC, platelets).    [name] informed that next prescription for melphalan will not be written until recovery of blood counts.  Reviewed importance of keeping a medication schedule and plan for any missed doses.  Medication reconciliation performed and medication/allergy list updated.  Insurance authorization for melphalan was not required. It will be covered under Medicare Part B medical benefits and patient's Medicare supplement. Test claim at the pharmacy revealed copayment $0 for 1st fill of melphalan. This will ship from the [facility] on 08/11/18 to deliver to patient's home on 08/12/18.  [name] informed the pharmacy will reach out 5-7 days prior to needing next fill of melphalan to  coordinate continued medication acquisition to prevent break in therapy.  [name] transferred to Dr. Gearldine Shown collaborative practice RN for follow-up on possible UTI per urinalysis. Urine culture is not yet resulted.  All questions answered.  [name] voiced understanding and appreciation.   They know to call the office with questions or concerns.  Johny Drilling, PharmD, BCPS, BCOP  08/10/2018  2:10 PM Oral Oncology Clinic [phone]

## 2018-08-11 ENCOUNTER — Telehealth: Payer: Self-pay | Admitting: *Deleted

## 2018-08-11 ENCOUNTER — Other Ambulatory Visit: Payer: Self-pay | Admitting: *Deleted

## 2018-08-11 DIAGNOSIS — R300 Dysuria: Secondary | ICD-10-CM

## 2018-08-11 DIAGNOSIS — N390 Urinary tract infection, site not specified: Secondary | ICD-10-CM

## 2018-08-11 MED FILL — predniSONE 20 MG TABS: 20 | 28 days supply | Qty: 8 | Fill #0

## 2018-08-11 MED FILL — MELPHALAN 2 MG TAB: 2 | 28 days supply | Qty: 16 | Fill #0

## 2018-08-11 MED FILL — ONDANSETRON HCL 8 MG TABLET: 8 | 10 days supply | Qty: 30 | Fill #0

## 2018-08-11 NOTE — Telephone Encounter (Signed)
Call to [facility] micro lab: sample never received for urine culture ordered on 08/10/18. Called daughter and she agrees to have caregiver pick up urine cup/wipes for patient to collect at home tomorrow (self-cath) and transport back to [facility] on 08/12/18. Left note for patient stressing to wipe end of penis with wipes or wash with soap and water before he collects sample and prior to all self-cath to prevent contamination in sample and introducing bacteria into his bladder. Re-ordered urine culture in system.

## 2018-08-12 ENCOUNTER — Other Ambulatory Visit: Payer: Self-pay

## 2018-08-12 DIAGNOSIS — E538 Deficiency of other specified B group vitamins: Secondary | ICD-10-CM | POA: Diagnosis not present

## 2018-08-12 DIAGNOSIS — C9000 Multiple myeloma not having achieved remission: Secondary | ICD-10-CM | POA: Diagnosis not present

## 2018-08-12 DIAGNOSIS — N189 Chronic kidney disease, unspecified: Secondary | ICD-10-CM | POA: Diagnosis not present

## 2018-08-12 DIAGNOSIS — R300 Dysuria: Secondary | ICD-10-CM

## 2018-08-12 DIAGNOSIS — D563 Thalassemia minor: Secondary | ICD-10-CM | POA: Diagnosis not present

## 2018-08-12 DIAGNOSIS — D61818 Other pancytopenia: Secondary | ICD-10-CM | POA: Diagnosis not present

## 2018-08-12 DIAGNOSIS — D631 Anemia in chronic kidney disease: Secondary | ICD-10-CM | POA: Diagnosis not present

## 2018-08-12 NOTE — Telephone Encounter (Signed)
Oral Oncology Patient Advocate Encounter  Confirmed with [facility] that [name], prednisone and Ondansetron was shipped on 7/15. [name] copay $0 Prednisone copay $1.95 Ondansetron copay $0 Part B covered the [name] and Ondansetron.  [name] Phone [phone] Fax [phone] 08/12/2018   9:42 AM

## 2018-08-13 ENCOUNTER — Telehealth: Payer: Self-pay | Admitting: *Deleted

## 2018-08-13 DIAGNOSIS — N3020 Other chronic cystitis without hematuria: Secondary | ICD-10-CM | POA: Diagnosis not present

## 2018-08-13 DIAGNOSIS — J449 Chronic obstructive pulmonary disease, unspecified: Secondary | ICD-10-CM | POA: Diagnosis not present

## 2018-08-13 DIAGNOSIS — C9000 Multiple myeloma not having achieved remission: Secondary | ICD-10-CM | POA: Diagnosis not present

## 2018-08-13 MED ORDER — CIPROFLOXACIN HCL 500 MG PO TABS: 500.0000 mg | ORAL_TABLET | Freq: Two times a day (BID) | ORAL | 0 refills | Status: DC

## 2018-08-13 NOTE — Telephone Encounter (Signed)
error 

## 2018-08-13 NOTE — Telephone Encounter (Signed)
Urine culture shows > 100,000 colonies of gram negative rods. Cipro sent to pharmacy per Dr. [name]. Faxed last office note and this report to Dr. Lysle Rubens, who patient is having a virtual appointment with today at 12:00 Daughter notified

## 2018-08-14 LAB — URINE CULTURE: Culture: >=100000 — AB

## 2018-08-23 ENCOUNTER — Inpatient Hospital Stay: Payer: Medicare Other

## 2018-08-23 ENCOUNTER — Other Ambulatory Visit: Payer: Self-pay

## 2018-08-23 ENCOUNTER — Ambulatory Visit (INDEPENDENT_AMBULATORY_CARE_PROVIDER_SITE_OTHER): Payer: Medicare Other | Admitting: *Deleted

## 2018-08-23 VITALS — BP 128/68 | HR 72 | Temp 98.7°F | Resp 17

## 2018-08-23 DIAGNOSIS — E538 Deficiency of other specified B group vitamins: Secondary | ICD-10-CM

## 2018-08-23 DIAGNOSIS — C9000 Multiple myeloma not having achieved remission: Secondary | ICD-10-CM | POA: Diagnosis not present

## 2018-08-23 DIAGNOSIS — D563 Thalassemia minor: Secondary | ICD-10-CM | POA: Diagnosis not present

## 2018-08-23 DIAGNOSIS — I255 Ischemic cardiomyopathy: Secondary | ICD-10-CM

## 2018-08-23 DIAGNOSIS — N189 Chronic kidney disease, unspecified: Secondary | ICD-10-CM | POA: Diagnosis not present

## 2018-08-23 DIAGNOSIS — I5022 Chronic systolic (congestive) heart failure: Secondary | ICD-10-CM

## 2018-08-23 DIAGNOSIS — D631 Anemia in chronic kidney disease: Secondary | ICD-10-CM | POA: Diagnosis not present

## 2018-08-23 DIAGNOSIS — D61818 Other pancytopenia: Secondary | ICD-10-CM | POA: Diagnosis not present

## 2018-08-23 LAB — CUP PACEART REMOTE DEVICE CHECK
Date Time Interrogation Session: 20200727130526
Implantable Lead Implant Date: 20111117
Implantable Lead Implant Date: 20111117
Implantable Lead Location: 753859
Implantable Lead Location: 753860
Implantable Lead Model: 7122
Implantable Pulse Generator Implant Date: 20111117
Pulse Gen Serial Number: 623396

## 2018-08-23 LAB — CBC WITH DIFFERENTIAL (CANCER CENTER ONLY)
Abs Immature Granulocytes: 0.02 10*3/uL (ref 0.00–0.07)
Basophils Absolute: 0 10*3/uL (ref 0.0–0.1)
Basophils Relative: 0 %
Eosinophils Absolute: 0.1 10*3/uL (ref 0.0–0.5)
Eosinophils Relative: 3 %
HCT: 28.7 % — ABNORMAL LOW (ref 39.0–52.0)
Hemoglobin: 8.8 g/dL — ABNORMAL LOW (ref 13.0–17.0)
Immature Granulocytes: 1 %
Lymphocytes Relative: 18 %
Lymphs Abs: 0.5 10*3/uL — ABNORMAL LOW (ref 0.7–4.0)
MCH: 23.4 pg — ABNORMAL LOW (ref 26.0–34.0)
MCHC: 30.7 g/dL (ref 30.0–36.0)
MCV: 76.3 fL — ABNORMAL LOW (ref 80.0–100.0)
Monocytes Absolute: 0.1 10*3/uL (ref 0.1–1.0)
Monocytes Relative: 3 %
Neutro Abs: 2.1 10*3/uL (ref 1.7–7.7)
Neutrophils Relative %: 75 %
Platelet Count: 89 10*3/uL — ABNORMAL LOW (ref 150–400)
RBC: 3.76 MIL/uL — ABNORMAL LOW (ref 4.22–5.81)
RDW: 21 % — ABNORMAL HIGH (ref 11.5–15.5)
WBC Count: 2.8 10*3/uL — ABNORMAL LOW (ref 4.0–10.5)
nRBC: 0 % (ref 0.0–0.2)

## 2018-08-23 MED ORDER — EPOETIN ALFA 40000 UNIT/ML IJ SOLN
INTRAMUSCULAR | Status: AC
  Filled 2018-08-23: qty 1

## 2018-08-23 MED ORDER — EPOETIN ALFA 20000 UNIT/ML IJ SOLN
60000.0000 [IU] | Freq: Once | INTRAMUSCULAR | Status: AC
  Administered 2018-08-23: 60000 [IU] via SUBCUTANEOUS

## 2018-08-23 MED ORDER — EPOETIN ALFA 20000 UNIT/ML IJ SOLN
INTRAMUSCULAR | Status: AC
  Filled 2018-08-23: qty 1

## 2018-08-23 NOTE — Patient Instructions (Signed)

## 2018-08-24 ENCOUNTER — Ambulatory Visit (INDEPENDENT_AMBULATORY_CARE_PROVIDER_SITE_OTHER): Payer: Medicare Other

## 2018-08-24 DIAGNOSIS — I5032 Chronic diastolic (congestive) heart failure: Secondary | ICD-10-CM | POA: Diagnosis not present

## 2018-08-24 DIAGNOSIS — Z95810 Presence of automatic (implantable) cardiac defibrillator: Secondary | ICD-10-CM

## 2018-08-27 DIAGNOSIS — N183 Chronic kidney disease, stage 3 (moderate): Secondary | ICD-10-CM | POA: Diagnosis not present

## 2018-08-27 DIAGNOSIS — J441 Chronic obstructive pulmonary disease with (acute) exacerbation: Secondary | ICD-10-CM | POA: Diagnosis not present

## 2018-08-27 DIAGNOSIS — M1990 Unspecified osteoarthritis, unspecified site: Secondary | ICD-10-CM | POA: Diagnosis not present

## 2018-08-27 DIAGNOSIS — I5030 Unspecified diastolic (congestive) heart failure: Secondary | ICD-10-CM | POA: Diagnosis not present

## 2018-08-27 DIAGNOSIS — E782 Mixed hyperlipidemia: Secondary | ICD-10-CM | POA: Diagnosis not present

## 2018-08-27 DIAGNOSIS — I10 Essential (primary) hypertension: Secondary | ICD-10-CM | POA: Diagnosis not present

## 2018-08-27 DIAGNOSIS — N401 Enlarged prostate with lower urinary tract symptoms: Secondary | ICD-10-CM | POA: Diagnosis not present

## 2018-08-27 DIAGNOSIS — E1122 Type 2 diabetes mellitus with diabetic chronic kidney disease: Secondary | ICD-10-CM | POA: Diagnosis not present

## 2018-08-27 DIAGNOSIS — I2510 Atherosclerotic heart disease of native coronary artery without angina pectoris: Secondary | ICD-10-CM | POA: Diagnosis not present

## 2018-08-27 DIAGNOSIS — D61818 Other pancytopenia: Secondary | ICD-10-CM | POA: Diagnosis not present

## 2018-08-27 DIAGNOSIS — I252 Old myocardial infarction: Secondary | ICD-10-CM | POA: Diagnosis not present

## 2018-08-27 DIAGNOSIS — E119 Type 2 diabetes mellitus without complications: Secondary | ICD-10-CM | POA: Diagnosis not present

## 2018-08-27 NOTE — Progress Notes (Signed)
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  Left detailed message per DPR regarding transmission. Transmission reviewed.   CorVue thoracic impedancenormal.  Prescribed:Furosemide 20 mgTake 1 tablet (20 mg total) by mouth daily as needed for edema.Daughter previously reported pt does not like to take PRN Furosemide because he does self catheterizations.  Labs: 07/28/2018 Creatinine 1.16, BUN 31, Potassium 4.0, Sodium 139, GFR 55->60 07/26/2018 Creatinine 1.09, BUN 29, Potassium 4.3, Sodium 145, GFR 59->60  06/28/2018 Creatinine 1.05, BUN 30, Potassium 4.1, Sodium 143, GFR >60  05/10/2018 Creatinine 1.11, BUN 28, Potassium 4.1, Sodium 142, GFR 58->60  A complete set of results can be found in Results Review.  Recommendations: Left voice mail with ICM number and encouraged to call if experiencing any fluid symptoms.  Follow-up plan: ICM clinic phone appointment on9/01/2018.  Copy of ICM check sent to Dr.Allred.  3 month ICM trend: 08/23/2018    1 Year ICM trend:       Rosalene Billings, RN 08/27/2018 3:27 PM

## 2018-08-30 ENCOUNTER — Telehealth: Payer: Self-pay | Admitting: *Deleted

## 2018-08-30 ENCOUNTER — Inpatient Hospital Stay: Payer: Medicare Other

## 2018-08-30 ENCOUNTER — Other Ambulatory Visit: Payer: Self-pay | Admitting: *Deleted

## 2018-08-30 ENCOUNTER — Other Ambulatory Visit: Payer: Self-pay | Admitting: Emergency Medicine

## 2018-08-30 ENCOUNTER — Other Ambulatory Visit: Payer: Self-pay

## 2018-08-30 ENCOUNTER — Inpatient Hospital Stay (HOSPITAL_BASED_OUTPATIENT_CLINIC_OR_DEPARTMENT_OTHER): Payer: Medicare Other | Admitting: Medical

## 2018-08-30 ENCOUNTER — Inpatient Hospital Stay: Payer: Medicare Other | Attending: Oncology

## 2018-08-30 DIAGNOSIS — D649 Anemia, unspecified: Secondary | ICD-10-CM

## 2018-08-30 DIAGNOSIS — D631 Anemia in chronic kidney disease: Secondary | ICD-10-CM | POA: Insufficient documentation

## 2018-08-30 DIAGNOSIS — N189 Chronic kidney disease, unspecified: Secondary | ICD-10-CM | POA: Diagnosis not present

## 2018-08-30 DIAGNOSIS — I509 Heart failure, unspecified: Secondary | ICD-10-CM | POA: Insufficient documentation

## 2018-08-30 DIAGNOSIS — C9000 Multiple myeloma not having achieved remission: Secondary | ICD-10-CM | POA: Insufficient documentation

## 2018-08-30 DIAGNOSIS — Z87440 Personal history of urinary (tract) infections: Secondary | ICD-10-CM | POA: Diagnosis not present

## 2018-08-30 DIAGNOSIS — D61818 Other pancytopenia: Secondary | ICD-10-CM | POA: Diagnosis not present

## 2018-08-30 DIAGNOSIS — D563 Thalassemia minor: Secondary | ICD-10-CM | POA: Insufficient documentation

## 2018-08-30 DIAGNOSIS — D508 Other iron deficiency anemias: Secondary | ICD-10-CM | POA: Diagnosis not present

## 2018-08-30 DIAGNOSIS — R338 Other retention of urine: Secondary | ICD-10-CM | POA: Insufficient documentation

## 2018-08-30 DIAGNOSIS — I255 Ischemic cardiomyopathy: Secondary | ICD-10-CM

## 2018-08-30 DIAGNOSIS — J449 Chronic obstructive pulmonary disease, unspecified: Secondary | ICD-10-CM | POA: Diagnosis not present

## 2018-08-30 LAB — CBC WITH DIFFERENTIAL (CANCER CENTER ONLY)
Abs Immature Granulocytes: 0.01 10*3/uL (ref 0.00–0.07)
Basophils Absolute: 0 10*3/uL (ref 0.0–0.1)
Basophils Relative: 0 %
Eosinophils Absolute: 0 10*3/uL (ref 0.0–0.5)
Eosinophils Relative: 2 %
HCT: 26.3 % — ABNORMAL LOW (ref 39.0–52.0)
Hemoglobin: 8.1 g/dL — ABNORMAL LOW (ref 13.0–17.0)
Immature Granulocytes: 0 %
Lymphocytes Relative: 20 %
Lymphs Abs: 0.5 10*3/uL — ABNORMAL LOW (ref 0.7–4.0)
MCH: 23.8 pg — ABNORMAL LOW (ref 26.0–34.0)
MCHC: 30.8 g/dL (ref 30.0–36.0)
MCV: 77.4 fL — ABNORMAL LOW (ref 80.0–100.0)
Monocytes Absolute: 0.1 10*3/uL (ref 0.1–1.0)
Monocytes Relative: 3 %
Neutro Abs: 1.7 10*3/uL (ref 1.7–7.7)
Neutrophils Relative %: 75 %
Platelet Count: 56 10*3/uL — ABNORMAL LOW (ref 150–400)
RBC: 3.4 MIL/uL — ABNORMAL LOW (ref 4.22–5.81)
RDW: 20.3 % — ABNORMAL HIGH (ref 11.5–15.5)
WBC Count: 2.3 10*3/uL — ABNORMAL LOW (ref 4.0–10.5)
nRBC: 0 % (ref 0.0–0.2)

## 2018-08-30 LAB — PREPARE RBC (CROSSMATCH)

## 2018-08-30 MED ORDER — SODIUM CHLORIDE 0.9% IV SOLUTION
250.0000 mL | Freq: Once | INTRAVENOUS | Status: DC
  Filled 2018-08-30: qty 250

## 2018-08-30 MED ORDER — HEPARIN SOD (PORK) LOCK FLUSH 100 UNIT/ML IV SOLN
250.0000 [IU] | INTRAVENOUS | Status: DC | PRN
  Filled 2018-08-30: qty 5

## 2018-08-30 MED ORDER — HEPARIN SOD (PORK) LOCK FLUSH 100 UNIT/ML IV SOLN
500.0000 [IU] | Freq: Every day | INTRAVENOUS | Status: DC | PRN
  Filled 2018-08-30: qty 5

## 2018-08-30 MED ORDER — SODIUM CHLORIDE 0.9% FLUSH
10.0000 mL | INTRAVENOUS | Status: DC | PRN
  Filled 2018-08-30: qty 10

## 2018-08-30 MED ORDER — SODIUM CHLORIDE 0.9% IV SOLUTION
250.0000 mL | Freq: Once | INTRAVENOUS | Status: AC
  Administered 2018-08-30: 13:00:00 250 mL via INTRAVENOUS
  Filled 2018-08-30: qty 250

## 2018-08-30 MED ORDER — SODIUM CHLORIDE 0.9% FLUSH
3.0000 mL | INTRAVENOUS | Status: DC | PRN
  Filled 2018-08-30: qty 10

## 2018-08-30 NOTE — Patient Instructions (Signed)

## 2018-08-30 NOTE — Telephone Encounter (Signed)
Called to report increase in fatigue and shortness of breath. Patient tells her he thinks he needs blood. OK to arrange for lab/transfusion today and [facility] will be able to transfuse. Daughter notified to have him here at 11:30 am. Return call from daughter at 0930 requesting someone to see him today as well. Spoke w/Van Satira Sark, PA in [facility] and he will see patient while he is here today at request of Dr. [name].

## 2018-08-30 NOTE — Patient Instructions (Signed)
Anemia  Anemia is a condition in which you do not have enough red blood cells or hemoglobin. Hemoglobin is a substance in red blood cells that carries oxygen. When you do not have enough red blood cells or hemoglobin (are anemic), your body cannot get enough oxygen and your organs may not work properly. As a result, you may feel very tired or have other problems. What are the causes? Common causes of anemia include:  Excessive bleeding. Anemia can be caused by excessive bleeding inside or outside the body, including bleeding from the intestine or from periods in women.  Poor nutrition.  Long-lasting (chronic) kidney, thyroid, and liver disease.  Bone marrow disorders.  Cancer and treatments for cancer.  HIV (human immunodeficiency virus) and AIDS (acquired immunodeficiency syndrome).  Treatments for HIV and AIDS.  Spleen problems.  Blood disorders.  Infections, medicines, and autoimmune disorders that destroy red blood cells. What are the signs or symptoms? Symptoms of this condition include:  Minor weakness.  Dizziness.  Headache.  Feeling heartbeats that are irregular or faster than normal (palpitations).  Shortness of breath, especially with exercise.  Paleness.  Cold sensitivity.  Indigestion.  Nausea.  Difficulty sleeping.  Difficulty concentrating. Symptoms may occur suddenly or develop slowly. If your anemia is mild, you may not have symptoms. How is this diagnosed? This condition is diagnosed based on:  Blood tests.  Your medical history.  A physical exam.  Bone marrow biopsy. Your health care provider may also check your stool (feces) for blood and may do additional testing to look for the cause of your bleeding. You may also have other tests, including:  Imaging tests, such as a CT scan or MRI.  Endoscopy.  Colonoscopy. How is this treated? Treatment for this condition depends on the cause. If you continue to lose a lot of blood, you may  need to be treated at a hospital. Treatment may include:  Taking supplements of iron, vitamin S31, or folic acid.  Taking a hormone medicine (erythropoietin) that can help to stimulate red blood cell growth.  Having a blood transfusion. This may be needed if you lose a lot of blood.  Making changes to your diet.  Having surgery to remove your spleen. Follow these instructions at home:  Take over-the-counter and prescription medicines only as told by your health care provider.  Take supplements only as told by your health care provider.  Follow any diet instructions that you were given.  Keep all follow-up visits as told by your health care provider. This is important. Contact a health care provider if:  You develop new bleeding anywhere in the body. Get help right away if:  You are very weak.  You are short of breath.  You have pain in your abdomen or chest.  You are dizzy or feel faint.  You have trouble concentrating.  You have bloody or black, tarry stools.  You vomit repeatedly or you vomit up blood. Summary  Anemia is a condition in which you do not have enough red blood cells or enough of a substance in your red blood cells that carries oxygen (hemoglobin).  Symptoms may occur suddenly or develop slowly.  If your anemia is mild, you may not have symptoms.  This condition is diagnosed with blood tests as well as a medical history and physical exam. Other tests may be needed.  Treatment for this condition depends on the cause of the anemia. This information is not intended to replace advice given to you by  your health care provider. Make sure you discuss any questions you have with your health care provider. Document Released: 02/21/2004 Document Revised: 12/26/2016 Document Reviewed: 02/15/2016 Elsevier Patient Education  2020 [employer].

## 2018-08-30 NOTE — Progress Notes (Signed)
Symptoms Management Clinic Progress Note   [name], MD [id number] 1925-06-10 85 y.o.  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 Dominica Severin B. Sherrill  Actively treated with chemotherapy/immunotherapy/hormonal therapy: yes  Current therapy: [name] and prednisone  Last treated: 08/14/2018  Next scheduled appointment with provider: 09/06/2018  Assessment: Plan:    Pancytopenia and anemia secondary to multiple myeloma compounded by melphalan: [name] is seen today for transfusion of 1 unit of packed red blood cells.  Multiple myeloma: [name] was seen today with Dr. [name].  He is status post a restart of treatment with the patient completing 4 days of melphalan and prednisone on 08/14/2018.  He is scheduled to return for routine follow-up on 09/06/2018.  Please see After Visit Summary for patient specific instructions.  Future Appointments  Date Time Provider [place]  09/06/2018  2:45 PM [facility] LAB 2 [facility] None  09/06/2018  3:15 PM Owens Shark, NP [facility] None  09/06/2018  3:45 PM [facility] [place] FLUSH [facility] None  09/28/2018  8:35 AM [facility] DEVICE REMOTES [facility]    No orders of the defined types were placed in this encounter.      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 (DOB July 07, 1925) male.  Chief Complaint:  Chief Complaint  Patient presents with  . 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 Dominica Severin B. Sherrill.  [name] recently restarted treatment for multiple myeloma.  He completed 4 days of oral melphalan and prednisone on 08/14/2018.  He presented to clinic today for a transfusion of 1 unit of packed red blood cells.  He has been having shortness of breath, cough, and fatigue.  He continues to self cath.  He has a history of recurrent urinary tract infections.  He is recently been placed on a suppressive antibiotic  for his recurrent urinary tract infections.  His family called earlier today and asked that he be seen today while he was receiving a transfusion.  There was concern regarding his fatigue and shortness of breath.  Medications: I have reviewed the patient's current medications.  Allergies: No Known Allergies  Past Medical History:  Diagnosis Date  . Anemia, unspecified   . CAD (coronary artery disease)   . Cataracts, bilateral   . CHF (congestive heart failure) ([place])   . Chronic airway obstruction, not elsewhere classified    on o2 at night  . Diabetes mellitus   . Diverticulosis of colon (without mention of hemorrhage)   . Dyslipidemia   . Emphysema   . Hemorrhoids   . ICD (implantable cardiac defibrillator) in place    st jude  . Ischemic cardiomyopathy   . Macular degeneration   . MI, old 2010 or 2011  . Prostatic hypertrophy    hx of  . Retention of urine, unspecified   . Unspecified disorder resulting from impaired renal function     Past Surgical History:  Procedure Laterality Date  . CARDIAC DEFIBRILLATOR PLACEMENT  2011   st jude  . CATARACT EXTRACTION    . PACEMAKER INSERTION  2011  . pci     4/11  . TONSILLECTOMY  85 yo    Family History  Problem Relation Age of Onset  . Coronary artery disease Mother   . Stroke Mother   . Congestive Heart Failure Mother   . Lung cancer Father  smoker  . Breast cancer Sister   . Atrial fibrillation Daughter   . Heart attack Brother     Social History   Socioeconomic History  . Marital status: Widowed    Spouse name: Not on file  . Number of children: Not on file  . Years of education: Not on file  . Highest education level: Not on file  Occupational History  . Occupation: Engineer, site: [employer]  . Financial resource strain: Not on file  . Food insecurity    Worry: Not on file    Inability: Not on file  . Transportation needs    Medical: Not on file    Non-medical: Not  on file  Tobacco Use  . Smoking status: Former Smoker    Types: Cigarettes    Quit date: 04/27/2009    Years since quitting: 9.3  . Smokeless tobacco: Never Used  Substance and Sexual Activity  . Alcohol use: Yes    Comment: occasional beer  . Drug use: No  . Sexual activity: Never  Lifestyle  . Physical activity    Days per week: Not on file    Minutes per session: Not on file  . Stress: Not on file  Relationships  . Social Herbalist on phone: Not on file    Gets together: Not on file    Attends religious service: Not on file    Active member of club or organization: Not on file    Attends meetings of clubs or organizations: Not on file    Relationship status: Not on file  . Intimate partner violence    Fear of current or ex partner: Not on file    Emotionally abused: Not on file    Physically abused: Not on file    Forced sexual activity: Not on file  Other Topics Concern  . Not on file  Social History Narrative   Lives in a house with stairs, which he needs to use, with his wife.  Wife is not able-bodied.  Does not use a cane or walker, but is unsteady on his feet.        [name] Strassman:  [phone] cell, [phone], daughter, POA.     Boyd Kerbs:  [phone], nephew   Juluis Pitch:  [phone], daughter          Past Medical History, Surgical history, Social history, and Family history were reviewed and updated as appropriate.   Please see review of systems for further details on the patient's review from today.   Review of Systems:  Review of Systems  Constitutional: Positive for fatigue. Negative for chills, diaphoresis and fever.  HENT: Negative for congestion, postnasal drip, rhinorrhea and sore throat.   Respiratory: Positive for cough. Negative for shortness of breath and wheezing.   Cardiovascular: Negative for palpitations.  Genitourinary:       Continued self cathing with a history of recurrent UTIs.  Neurological: Negative for  headaches.    Objective:   Physical Exam:  There were no vitals taken for this visit. ECOG: 1  Physical Exam Constitutional:      General: He is not in acute distress.    Appearance: He is not diaphoretic.  HENT:     Head: Normocephalic and atraumatic.     Right Ear: External ear normal.     Left Ear: External ear normal.     Mouth/Throat:     Pharynx: No oropharyngeal exudate.  Neck:     Musculoskeletal: Normal range of motion and neck supple.  Cardiovascular:     Rate and Rhythm: Normal rate and regular rhythm.     Heart sounds: Normal heart sounds. No murmur. No friction rub. No gallop.      Comments: Distant heart sounds. Pulmonary:     Effort: Pulmonary effort is normal. No respiratory distress.     Breath sounds: Normal breath sounds. No wheezing or rales.  Musculoskeletal:     Right lower leg: Edema (Trace) present.     Left lower leg: Edema (Trace) present.  Lymphadenopathy:     Cervical: No cervical adenopathy.  Skin:    General: Skin is warm and dry.     Findings: No erythema or rash.  Neurological:     Mental Status: He is alert.     Coordination: Coordination normal.     Gait: Gait abnormal (The patient is ambulating with the use of an electric scooter.).  Psychiatric:        Behavior: Behavior normal.        Thought Content: Thought content normal.        Judgment: Judgment normal.     Lab Review:     Component Value Date/Time   NA 139 07/28/2018 1509   NA 146 (H) 12/02/2017 1553   NA 140 01/12/2017 1538   K 4.0 07/28/2018 1509   K 4.4 01/12/2017 1538   CL 102 07/28/2018 1509   CO2 25 07/28/2018 1509   CO2 27 01/12/2017 1538   GLUCOSE 78 07/28/2018 1509   GLUCOSE 84 01/12/2017 1538   BUN 31 (H) 07/28/2018 1509   BUN 30 12/02/2017 1553   BUN 23.7 01/12/2017 1538   CREATININE 1.16 07/28/2018 1509   CREATININE 1.09 07/26/2018 1518   CREATININE 1.0 01/12/2017 1538   CALCIUM 8.6 (L) 07/28/2018 1509   CALCIUM 8.7 01/12/2017 1538   PROT 7.4  07/28/2018 1509   PROT 5.7 (L) 01/12/2017 1538   PROT 6.1 (L) 01/12/2017 1538   ALBUMIN 3.7 07/28/2018 1509   ALBUMIN 2.9 (L) 01/12/2017 1538   AST 21 07/28/2018 1509   AST 15 07/26/2018 1518   AST 11 01/12/2017 1538   ALT 18 07/28/2018 1509   ALT 11 07/26/2018 1518   ALT 12 01/12/2017 1538   ALKPHOS 48 07/28/2018 1509   ALKPHOS 49 01/12/2017 1538   BILITOT 0.7 07/28/2018 1509   BILITOT 0.4 07/26/2018 1518   BILITOT 0.47 01/12/2017 1538   GFRNONAA 55 (L) 07/28/2018 1509   GFRNONAA 59 (L) 07/26/2018 1518   GFRAA >60 07/28/2018 1509   GFRAA >60 07/26/2018 1518       Component Value Date/Time   WBC 2.3 (L) 08/30/2018 1146   WBC 4.2 07/28/2018 1509   RBC 3.40 (L) 08/30/2018 1146   HGB 8.1 (L) 08/30/2018 1146   HGB 7.4 (L) 01/12/2017 1538   HCT 26.3 (L) 08/30/2018 1146   HCT 23.8 (L) 01/12/2017 1538   PLT 56 (L) 08/30/2018 1146   PLT 89 (L) 01/12/2017 1538   MCV 77.4 (L) 08/30/2018 1146   MCV 74.3 (L) 01/12/2017 1538   MCH 23.8 (L) 08/30/2018 1146   MCHC 30.8 08/30/2018 1146   RDW 20.3 (H) 08/30/2018 1146   RDW 21.0 (H) 01/12/2017 1538   LYMPHSABS 0.5 (L) 08/30/2018 1146   LYMPHSABS 0.6 (L) 01/12/2017 1538   MONOABS 0.1 08/30/2018 1146   MONOABS 0.3 01/12/2017 1538   EOSABS 0.0 08/30/2018 1146   EOSABS 0.1 01/12/2017 1538  BASOSABS 0.0 08/30/2018 1146   BASOSABS 0.1 01/12/2017 1538   -------------------------------  Imaging from last 24 hours (if applicable):  Radiology interpretation: No results found.      This patient was seen with Dr. [name] with my treatment plan reviewed with him. He expressed agreement with my medical management of this patient.  This was a shared visit with Sandi Mealy.  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  He completed a cycle of melphalan and prednisone beginning 08/16/2018.  He appears to have tolerated the treatment well.  He presents today with increased dyspnea, likely secondary to COPD and anemia.  He will be transfused with 1  unit of packed red blood cells.  He will return as scheduled on 09/06/2018.  Julieanne Manson, MD

## 2018-08-30 NOTE — Progress Notes (Signed)
Pt seen by PA Lucianne Lei & MD [name] during transfusion in [facility] room 25.

## 2018-08-31 LAB — TYPE AND SCREEN
ABO/RH(D): A POS
Antibody Screen: NEGATIVE
Unit division: 0

## 2018-08-31 LAB — BPAM RBC
Blood Product Expiration Date: 202008252359
ISSUE DATE / TIME: 202008031333
Unit Type and Rh: 6200

## 2018-09-02 ENCOUNTER — Telehealth: Payer: Self-pay | Admitting: *Deleted

## 2018-09-02 NOTE — Telephone Encounter (Signed)
Daughter, [name] [name] is asking for Dr. 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 Both work for [employer] system and employer is asking for this documentation.

## 2018-09-03 ENCOUNTER — Telehealth: Payer: Self-pay

## 2018-09-03 NOTE — Telephone Encounter (Signed)
Daughter called back today to report they need the letters by Monday. MD is aware of request.

## 2018-09-03 NOTE — Telephone Encounter (Signed)
Spoke with daughter in regards to letter needed for pt by Monday. Advised that Dr [name] had the information and was aware that the letter was needed by Monday. Advised pt that she will receive a call to pick up letter once it is ready. Pt daughter verbalized understanding.

## 2018-09-06 ENCOUNTER — Inpatient Hospital Stay: Payer: Medicare Other

## 2018-09-06 ENCOUNTER — Inpatient Hospital Stay (HOSPITAL_BASED_OUTPATIENT_CLINIC_OR_DEPARTMENT_OTHER): Payer: Medicare Other | Admitting: Nurse Practitioner

## 2018-09-06 ENCOUNTER — Other Ambulatory Visit: Payer: Self-pay

## 2018-09-06 VITALS — BP 128/77 | HR 85 | Temp 99.3°F | Resp 18 | Ht 72.0 in | Wt 143.6 lb

## 2018-09-06 DIAGNOSIS — C9000 Multiple myeloma not having achieved remission: Secondary | ICD-10-CM

## 2018-09-06 DIAGNOSIS — D61818 Other pancytopenia: Secondary | ICD-10-CM | POA: Diagnosis not present

## 2018-09-06 DIAGNOSIS — D631 Anemia in chronic kidney disease: Secondary | ICD-10-CM | POA: Diagnosis not present

## 2018-09-06 DIAGNOSIS — N189 Chronic kidney disease, unspecified: Secondary | ICD-10-CM | POA: Diagnosis not present

## 2018-09-06 DIAGNOSIS — J449 Chronic obstructive pulmonary disease, unspecified: Secondary | ICD-10-CM | POA: Diagnosis not present

## 2018-09-06 DIAGNOSIS — E538 Deficiency of other specified B group vitamins: Secondary | ICD-10-CM

## 2018-09-06 DIAGNOSIS — I255 Ischemic cardiomyopathy: Secondary | ICD-10-CM

## 2018-09-06 DIAGNOSIS — D563 Thalassemia minor: Secondary | ICD-10-CM | POA: Diagnosis not present

## 2018-09-06 LAB — CMP (CANCER CENTER ONLY)
ALT: 9 U/L (ref 0–44)
AST: 11 U/L — ABNORMAL LOW (ref 15–41)
Albumin: 3.1 g/dL — ABNORMAL LOW (ref 3.5–5.0)
Alkaline Phosphatase: 53 U/L (ref 38–126)
Anion gap: 11 (ref 5–15)
BUN: 37 mg/dL — ABNORMAL HIGH (ref 8–23)
CO2: 26 mmol/L (ref 22–32)
Calcium: 8.8 mg/dL — ABNORMAL LOW (ref 8.9–10.3)
Chloride: 107 mmol/L (ref 98–111)
Creatinine: 1.26 mg/dL — ABNORMAL HIGH (ref 0.61–1.24)
GFR, Est AFR Am: 57 mL/min — ABNORMAL LOW
GFR, Estimated: 50 mL/min — ABNORMAL LOW
Glucose, Bld: 118 mg/dL — ABNORMAL HIGH (ref 70–99)
Potassium: 4.2 mmol/L (ref 3.5–5.1)
Sodium: 144 mmol/L (ref 135–145)
Total Bilirubin: 0.4 mg/dL (ref 0.3–1.2)
Total Protein: 6.7 g/dL (ref 6.5–8.1)

## 2018-09-06 LAB — CBC WITH DIFFERENTIAL (CANCER CENTER ONLY)
Abs Immature Granulocytes: 0 10*3/uL (ref 0.00–0.07)
Basophils Absolute: 0 10*3/uL (ref 0.0–0.1)
Basophils Relative: 0 %
Eosinophils Absolute: 0 10*3/uL (ref 0.0–0.5)
Eosinophils Relative: 3 %
HCT: 26.5 % — ABNORMAL LOW (ref 39.0–52.0)
Hemoglobin: 8.1 g/dL — ABNORMAL LOW (ref 13.0–17.0)
Immature Granulocytes: 0 %
Lymphocytes Relative: 32 %
Lymphs Abs: 0.5 10*3/uL — ABNORMAL LOW (ref 0.7–4.0)
MCH: 24.3 pg — ABNORMAL LOW (ref 26.0–34.0)
MCHC: 30.6 g/dL (ref 30.0–36.0)
MCV: 79.3 fL — ABNORMAL LOW (ref 80.0–100.0)
Monocytes Absolute: 0.1 10*3/uL (ref 0.1–1.0)
Monocytes Relative: 4 %
Neutro Abs: 1 10*3/uL — ABNORMAL LOW (ref 1.7–7.7)
Neutrophils Relative %: 61 %
Platelet Count: 14 10*3/uL — ABNORMAL LOW (ref 150–400)
RBC: 3.34 MIL/uL — ABNORMAL LOW (ref 4.22–5.81)
RDW: 19.6 % — ABNORMAL HIGH (ref 11.5–15.5)
WBC Count: 1.6 10*3/uL — ABNORMAL LOW (ref 4.0–10.5)
nRBC: 0 % (ref 0.0–0.2)

## 2018-09-06 MED ORDER — EPOETIN ALFA 20000 UNIT/ML IJ SOLN
INTRAMUSCULAR | Status: AC
  Filled 2018-09-06: qty 1

## 2018-09-06 MED ORDER — EPOETIN ALFA 20000 UNIT/ML IJ SOLN
60000.0000 [IU] | Freq: Once | INTRAMUSCULAR | Status: AC
  Administered 2018-09-06: 16:00:00 60000 [IU] via SUBCUTANEOUS

## 2018-09-06 MED ORDER — EPOETIN ALFA 40000 UNIT/ML IJ SOLN
INTRAMUSCULAR | Status: AC
  Filled 2018-09-06: qty 1

## 2018-09-06 NOTE — Patient Instructions (Signed)
Epoetin Alfa injection °What is this medicine? °EPOETIN ALFA (e POE e tin AL fa) helps your body make more red blood cells. This medicine is used to treat anemia caused by chronic kidney disease, cancer chemotherapy, or HIV-therapy. It may also be used before surgery if you have anemia. °This medicine may be used for other purposes; ask your health care provider or pharmacist if you have questions. °COMMON BRAND NAME(S): Epogen, Procrit, Retacrit °What should I tell my health care provider before I take this medicine? °They need to know if you have any of these conditions: °-cancer °-heart disease °-high blood pressure °-history of blood clots °-history of stroke °-low levels of folate, iron, or vitamin B12 in the blood °-seizures °-an unusual or allergic reaction to erythropoietin, albumin, benzyl alcohol, hamster proteins, other medicines, foods, dyes, or preservatives °-pregnant or trying to get pregnant °-breast-feeding °How should I use this medicine? °This medicine is for injection into a vein or under the skin. It is usually given by a health care professional in a hospital or clinic setting. °If you get this medicine at home, you will be taught how to prepare and give this medicine. Use exactly as directed. Take your medicine at regular intervals. Do not take your medicine more often than directed. °It is important that you put your used needles and syringes in a special sharps container. Do not put them in a trash can. If you do not have a sharps container, call your pharmacist or healthcare provider to get one. °A special MedGuide will be given to you by the pharmacist with each prescription and refill. Be sure to read this information carefully each time. °Talk to your pediatrician regarding the use of this medicine in children. While this drug may be prescribed for selected conditions, precautions do apply. °Overdosage: If you think you have taken too much of this medicine contact a poison control center  or emergency room at once. °NOTE: This medicine is only for you. Do not share this medicine with others. °What if I miss a dose? °If you miss a dose, take it as soon as you can. If it is almost time for your next dose, take only that dose. Do not take double or extra doses. °What may interact with this medicine? °Interactions have not been studied. °This list may not describe all possible interactions. Give your health care provider a list of all the medicines, herbs, non-prescription drugs, or dietary supplements you use. Also tell them if you smoke, drink alcohol, or use illegal drugs. Some items may interact with your medicine. °What should I watch for while using this medicine? °Your condition will be monitored carefully while you are receiving this medicine. °You may need blood work done while you are taking this medicine. °This medicine may cause a decrease in vitamin B6. You should make sure that you get enough vitamin B6 while you are taking this medicine. Discuss the foods you eat and the vitamins you take with your health care professional. °What side effects may I notice from receiving this medicine? °Side effects that you should report to your doctor or health care professional as soon as possible: °-allergic reactions like skin rash, itching or hives, swelling of the face, lips, or tongue °-seizures °-signs and symptoms of a blood clot such as breathing problems; changes in vision; chest pain; severe, sudden headache; pain, swelling, warmth in the leg; trouble speaking; sudden numbness or weakness of the face, arm or leg °-signs and symptoms of a stroke   like changes in vision; confusion; trouble speaking or understanding; severe headaches; sudden numbness or weakness of the face, arm or leg; trouble walking; dizziness; loss of balance or coordination °Side effects that usually do not require medical attention (report to your doctor or health care professional if they continue or are  bothersome): °-chills °-cough °-dizziness °-fever °-headaches °-joint pain °-muscle cramps °-muscle pain °-nausea, vomiting °-pain, redness, or irritation at site where injected °This list may not describe all possible side effects. Call your doctor for medical advice about side effects. You may report side effects to FDA at 1-800-FDA-1088. °Where should I keep my medicine? °Keep out of the reach of children. °Store in a refrigerator between 2 and 8 degrees C (36 and 46 degrees F). Do not freeze or shake. Throw away any unused portion if using a single-dose vial. Multi-dose vials can be kept in the refrigerator for up to 21 days after the initial dose. Throw away unused medicine. °NOTE: This sheet is a summary. It may not cover all possible information. If you have questions about this medicine, talk to your doctor, pharmacist, or health care provider. °© 2019 Elsevier/Gold Standard (2016-08-22 08:35:19) ° °

## 2018-09-06 NOTE — Progress Notes (Signed)
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  He completed cycle 1 melphalan/prednisone beginning 08/16/2018.  He continues erythropoietin.  He was transfused 1 unit of blood on 08/30/2018 for a hemoglobin of 8.1.  He felt better following the blood transfusion.  He denies bleeding.  No fever.  No nausea or vomiting.  No mouth sores.  No diarrhea.  He has an intermittent "cramp" type pain at the right chest.  He wonders if he injured a muscle.  Objective:  Vital signs in last 24 hours:  Blood pressure 128/77, pulse 85, temperature 99.3 F (37.4 C), temperature source Oral, resp. rate 18, height 6' (1.829 m), weight 143 lb 9.6 oz (65.1 kg), SpO2 96 %.    HEENT: Few small petechiae buccal mucosa. GI: Abdomen soft and nontender. Vascular: No leg edema.  Skin: Ecchymosis at the right chest and scattered over the forearms.   Lab Results:  Lab Results  Component Value Date   WBC 1.6 (L) 09/06/2018   HGB 8.1 (L) 09/06/2018   HCT 26.5 (L) 09/06/2018   MCV 79.3 (L) 09/06/2018   PLT 14 (L) 09/06/2018   NEUTROABS 1.0 (L) 09/06/2018    Imaging:  No results found.  Medications: I have reviewed the patient's current medications.  Assessment/Plan: 1.Pancytopenia-transfused with packed red blood cells 11/28/2016, 12/27/2016, 01/13/2017, 02/02/2017 2. History of beta thalassemia trait 3. History of vitamin B-12 deficiency-vitamin B-12 replacement initiated 11/11/2016 4. History of a serum monoclonal IgAkappaprotein-elevated serum free kappa light chains, IgA, and M spike10/16/2018  Bone marrow biopsy 12/03/2016-consistent with multiple myeloma  Bone survey 12/06/2016-no destructive or lytic bone lesions identified.  Revlimid 21 days on/7 days off and weekly dexamethasone 12/16/2016(Decadron discontinued 12/29/2016 secondary to altered mental status)  Cycle 2 single agent Revlimid  01/14/2017(discontinued 3 days early)  Serum light chains, IgAimproved 02/02/2017  Cycle3 single agent Revlimid beginning 02/16/2017  Cycle4single agent Revlimid beginning 03/16/2017  Cycle 5 single agent Revlimid beginning 04/14/2017(Revlimid placed on hold beginning 04/28/2017 due to thrombocytopenia)  Cycle 6 single agent Revlimid beginning 05/23/2017 x 14 days  Cycle 1 melphalan/prednisone 08/16/2018 5. COPD with acute and chronic respiratory failure/hypoxemia 6. History of coronary artery disease 7. Ischemic cardiomyopathy 8. ICD in place 9. Prostatic hypertrophy 10.Inappropriately low erythropoietin level 11/28/2016 11. Severe pancytopenia secondary to multiple myeloma and Revlimid-improved 12. Fever-likely related to an upper respiratory infection, RSV positive swab 02/02/2017 13.Anemia secondary to renal insufficiency-trial of weekly erythropoietin initiated 06/17/2017.Erythropoietin dose increased to 60,000 units weekly beginning 07/28/2017.  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  He completed cycle 1 melphalan/prednisone beginning 08/16/2018.  We reviewed the CBC from today.  He has significant thrombocytopenia, moderate neutropenia.  He understands to contact the office with any bleeding, fever, chills, other signs of infection.  He will return for a repeat CBC 09/08/2018.  He will continue every 2-week erythropoietin.  He will return for lab and follow-up in 2 weeks.  He will contact the office in the interim as outlined above or with any other problems.  Patient seen with Dr. [name].    Ned Card ANP/GNP-BC   09/06/2018  4:01 PM  This was a shared visit with Ned Card.  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  He has developed severe thrombocytopenia following cycle 1 melphalan/prednisone.  This is likely delayed hematologic toxicity from melphalan.  We discussed the risk of bleeding with Dr.  [name].  He will return for a CBC on 09/08/2018.  We will provide platelet transfusion support as needed.  We will follow-up on myeloma markers prior to considering another cycle of melphalan.  Julieanne Manson, MD

## 2018-09-06 NOTE — Progress Notes (Signed)
Remote ICD transmission.   

## 2018-09-07 ENCOUNTER — Other Ambulatory Visit: Payer: Self-pay | Admitting: Oncology

## 2018-09-07 ENCOUNTER — Telehealth: Payer: Self-pay | Admitting: *Deleted

## 2018-09-07 ENCOUNTER — Telehealth: Payer: Self-pay | Admitting: Nurse Practitioner

## 2018-09-07 DIAGNOSIS — R300 Dysuria: Secondary | ICD-10-CM

## 2018-09-07 DIAGNOSIS — C9000 Multiple myeloma not having achieved remission: Secondary | ICD-10-CM

## 2018-09-07 NOTE — Telephone Encounter (Signed)
Called and spoke with patients daughter. Confirmed 8/24 appt

## 2018-09-07 NOTE — Telephone Encounter (Signed)
Notified daughter, [name] that the letters and FMLA papers are completed and faxed to [facility] [phone] as requested. She will pick up originals today. Reviewed yesterdays visit note and lab results and that he needs repeat lab on 8/12--not yet scheduled, so this was done and he will come in at 3 pm tomorrow. She is requesting a U/A and culture tomorrow as well and for results to go to Dr. Lysle Rubens.

## 2018-09-08 ENCOUNTER — Other Ambulatory Visit: Payer: Self-pay | Admitting: *Deleted

## 2018-09-08 ENCOUNTER — Other Ambulatory Visit: Payer: Self-pay

## 2018-09-08 ENCOUNTER — Telehealth: Payer: Self-pay | Admitting: *Deleted

## 2018-09-08 ENCOUNTER — Inpatient Hospital Stay: Payer: Medicare Other

## 2018-09-08 DIAGNOSIS — D61818 Other pancytopenia: Secondary | ICD-10-CM | POA: Diagnosis not present

## 2018-09-08 DIAGNOSIS — D649 Anemia, unspecified: Secondary | ICD-10-CM

## 2018-09-08 DIAGNOSIS — C9000 Multiple myeloma not having achieved remission: Secondary | ICD-10-CM

## 2018-09-08 DIAGNOSIS — D631 Anemia in chronic kidney disease: Secondary | ICD-10-CM | POA: Diagnosis not present

## 2018-09-08 DIAGNOSIS — J449 Chronic obstructive pulmonary disease, unspecified: Secondary | ICD-10-CM | POA: Diagnosis not present

## 2018-09-08 DIAGNOSIS — N189 Chronic kidney disease, unspecified: Secondary | ICD-10-CM | POA: Diagnosis not present

## 2018-09-08 DIAGNOSIS — R300 Dysuria: Secondary | ICD-10-CM

## 2018-09-08 DIAGNOSIS — D563 Thalassemia minor: Secondary | ICD-10-CM | POA: Diagnosis not present

## 2018-09-08 LAB — CBC WITH DIFFERENTIAL (CANCER CENTER ONLY)
Abs Immature Granulocytes: 0 K/uL (ref 0.00–0.07)
Basophils Absolute: 0 K/uL (ref 0.0–0.1)
Basophils Relative: 0 %
Eosinophils Absolute: 0 K/uL (ref 0.0–0.5)
Eosinophils Relative: 2 %
HCT: 25.2 % — ABNORMAL LOW (ref 39.0–52.0)
Hemoglobin: 7.7 g/dL — ABNORMAL LOW (ref 13.0–17.0)
Immature Granulocytes: 0 %
Lymphocytes Relative: 24 %
Lymphs Abs: 0.4 K/uL — ABNORMAL LOW (ref 0.7–4.0)
MCH: 24.1 pg — ABNORMAL LOW (ref 26.0–34.0)
MCHC: 30.6 g/dL (ref 30.0–36.0)
MCV: 79 fL — ABNORMAL LOW (ref 80.0–100.0)
Monocytes Absolute: 0.1 K/uL (ref 0.1–1.0)
Monocytes Relative: 4 %
Neutro Abs: 1.3 K/uL — ABNORMAL LOW (ref 1.7–7.7)
Neutrophils Relative %: 70 %
Platelet Count: 10 K/uL — ABNORMAL LOW (ref 150–400)
RBC: 3.19 MIL/uL — ABNORMAL LOW (ref 4.22–5.81)
RDW: 19.4 % — ABNORMAL HIGH (ref 11.5–15.5)
WBC Count: 1.9 K/uL — ABNORMAL LOW (ref 4.0–10.5)
nRBC: 0 % (ref 0.0–0.2)

## 2018-09-08 LAB — URINALYSIS, COMPLETE (UACMP) WITH MICROSCOPIC
Bilirubin Urine: NEGATIVE
Glucose, UA: NEGATIVE mg/dL
Ketones, ur: NEGATIVE mg/dL
Nitrite: NEGATIVE
Protein, ur: 30 mg/dL — AB
RBC / HPF: >50 RBC/hpf — ABNORMAL HIGH (ref 0–5)
Specific Gravity, Urine: 1.02 (ref 1.005–1.030)
pH: 5 (ref 5.0–8.0)

## 2018-09-08 NOTE — Telephone Encounter (Signed)
Called daughter and made her aware of CBC results and need to come in tomorrow for repeat labs and 1 unit of blood and possible platelet transfusion. She is not aware of any obvious bleeding at this time and this was confirmed with his caregiver.Informed her if he develops active bleeding during the night, he should go to the emergency room. She understands ad angrees. She reports they can get him here tomorrow as early as 1 pm if necessary. U/A results show trace bacteria with moderate amount of blood. Urine culture pending and will be forwarded to Dr. Lysle Rubens.

## 2018-09-09 ENCOUNTER — Inpatient Hospital Stay: Payer: Medicare Other

## 2018-09-09 ENCOUNTER — Other Ambulatory Visit: Payer: Self-pay

## 2018-09-09 ENCOUNTER — Other Ambulatory Visit: Payer: Self-pay | Admitting: *Deleted

## 2018-09-09 DIAGNOSIS — C9000 Multiple myeloma not having achieved remission: Secondary | ICD-10-CM

## 2018-09-09 DIAGNOSIS — N189 Chronic kidney disease, unspecified: Secondary | ICD-10-CM | POA: Diagnosis not present

## 2018-09-09 DIAGNOSIS — D649 Anemia, unspecified: Secondary | ICD-10-CM

## 2018-09-09 DIAGNOSIS — D696 Thrombocytopenia, unspecified: Secondary | ICD-10-CM

## 2018-09-09 DIAGNOSIS — D563 Thalassemia minor: Secondary | ICD-10-CM | POA: Diagnosis not present

## 2018-09-09 DIAGNOSIS — J449 Chronic obstructive pulmonary disease, unspecified: Secondary | ICD-10-CM | POA: Diagnosis not present

## 2018-09-09 DIAGNOSIS — D631 Anemia in chronic kidney disease: Secondary | ICD-10-CM | POA: Diagnosis not present

## 2018-09-09 DIAGNOSIS — D61818 Other pancytopenia: Secondary | ICD-10-CM | POA: Diagnosis not present

## 2018-09-09 LAB — CBC WITH DIFFERENTIAL (CANCER CENTER ONLY)
Abs Immature Granulocytes: 0.01 10*3/uL (ref 0.00–0.07)
Basophils Absolute: 0 10*3/uL (ref 0.0–0.1)
Basophils Relative: 0 %
Eosinophils Absolute: 0.1 10*3/uL (ref 0.0–0.5)
Eosinophils Relative: 3 %
HCT: 25.6 % — ABNORMAL LOW (ref 39.0–52.0)
Hemoglobin: 7.9 g/dL — ABNORMAL LOW (ref 13.0–17.0)
Immature Granulocytes: 1 %
Lymphocytes Relative: 30 %
Lymphs Abs: 0.7 10*3/uL (ref 0.7–4.0)
MCH: 24.4 pg — ABNORMAL LOW (ref 26.0–34.0)
MCHC: 30.9 g/dL (ref 30.0–36.0)
MCV: 79 fL — ABNORMAL LOW (ref 80.0–100.0)
Monocytes Absolute: 0.1 10*3/uL (ref 0.1–1.0)
Monocytes Relative: 4 %
Neutro Abs: 1.4 10*3/uL — ABNORMAL LOW (ref 1.7–7.7)
Neutrophils Relative %: 62 %
Platelet Count: 9 10*3/uL — CL (ref 150–400)
RBC: 3.24 MIL/uL — ABNORMAL LOW (ref 4.22–5.81)
RDW: 19.2 % — ABNORMAL HIGH (ref 11.5–15.5)
WBC Count: 2.2 10*3/uL — ABNORMAL LOW (ref 4.0–10.5)
nRBC: 0 % (ref 0.0–0.2)

## 2018-09-09 LAB — URINE CULTURE: Culture: NO GROWTH

## 2018-09-09 LAB — PREPARE RBC (CROSSMATCH)

## 2018-09-09 LAB — PLATELET COUNT (CANCER CENTER ONLY): Platelet Count: 49 10*3/uL — ABNORMAL LOW (ref 150–400)

## 2018-09-09 MED ORDER — SODIUM CHLORIDE 0.9% IV SOLUTION
250.0000 mL | Freq: Once | INTRAVENOUS | Status: DC
  Filled 2018-09-09: qty 250

## 2018-09-09 MED ORDER — SODIUM CHLORIDE 0.9% IV SOLUTION
250.0000 mL | Freq: Once | INTRAVENOUS | Status: AC
  Administered 2018-09-09: 13:00:00 250 mL via INTRAVENOUS
  Filled 2018-09-09: qty 250

## 2018-09-09 NOTE — Progress Notes (Signed)
Pt receiving 1 unit platelets and 1 unit PRBCs today.  Tolerated well.  Ate and drank during transfusions without any issue.  VSS.  Platelet recount drawn peripherally in clinic, value reported to MD [name] who gave VO that pt does not have to come back tomorrow for another transfusion.  Pt verbalized understanding of this and of d/c instructions to f/u as needed and signs of bleeding to watch out for/call back for.

## 2018-09-09 NOTE — Progress Notes (Signed)
Called blood bank that we will need platelets and 1 unit blood today. Both orders released and they will call when ready. Still have a platelet in house today.

## 2018-09-09 NOTE — Progress Notes (Addendum)
MD requesting post platelet transfusion count. Orders entered and [facility] RN notified. @ 1630: Post count for platelet = 49. This is adequate to wait for lab on 09/13/18 per Dr. [name]. Patient notified and scheduling message sent. Informed him to call for bleeding.

## 2018-09-09 NOTE — Patient Instructions (Signed)
Platelet Transfusion A platelet transfusion is a procedure in which you receive donated platelets through an IV. Platelets are tiny pieces of blood cells. When you get an injury, platelets clump together in the area to form a blood clot. This helps stop bleeding and is the beginning of the healing process. If you have too few platelets, your blood may have trouble clotting. This may cause you to bleed and bruise very easily. You may need a platelet transfusion if you have a condition that causes a low number of platelets (thrombocytopenia). A platelet transfusion may be used to stop or prevent excessive bleeding. Tell a health care provider about:  Any reactions you have had during previous transfusions.  Any allergies you have.  All medicines you are taking, including vitamins, herbs, eye drops, creams, and over-the-counter medicines.  Any blood disorders you have.  Any surgeries you have had.  Any medical conditions you have.  Whether you are pregnant or may be pregnant. What are the risks? Generally, this is a safe procedure. However, problems may occur, including:  Fever.  Infection.  Allergic reaction to the donor platelets.  Your body's disease-fighting system (immune system) attacking the donor platelets (hemolytic reaction). This is rare.  A rare reaction that causes lung damage (transfusion-related acute lung injury). What happens before the procedure? Medicines  Ask your health care provider about: ? Changing or stopping your regular medicines. This is especially important if you are taking diabetes medicines or blood thinners. ? Taking medicines such as aspirin and ibuprofen. These medicines can thin your blood. Do not take these medicines unless your health care provider tells you to take them. ? Taking over-the-counter medicines, vitamins, herbs, and supplements. General instructions  You will have a blood test to determine your blood type. Your blood type  determines what kind of platelets you will be given.  Follow instructions from your health care provider about eating or drinking restrictions.  If you have had an allergic reaction to a transfusion in the past, you may be given medicine to help prevent a reaction.  Your temperature, blood pressure, pulse, and breathing will be monitored. What happens during the procedure?   An IV will be inserted into one of your veins.  For your safety, two health care providers will verify your identity along with the donor platelets about to be infused.  A bag of donor platelets will be connected to your IV. The platelets will flow into your bloodstream. This usually takes 30-60 minutes.  Your temperature, blood pressure, pulse, and breathing will be monitored during the transfusion. This helps detect early signs of any reaction.  You will also be monitored for other symptoms that may indicate a reaction, including chills, hives, or itching.  If you have signs of a reaction at any time, your transfusion will be stopped, and you may be given medicine to help manage the reaction.  When your transfusion is complete, your IV will be removed.  Pressure may be applied to the IV site for a few minutes to stop any bleeding.  The IV site will be covered with a bandage (dressing). The procedure may vary among health care providers and hospitals. What happens after the procedure?  Your blood pressure, temperature, pulse, and breathing will be monitored until you leave the hospital or clinic.  You may have some bruising and soreness at your IV site. Follow these instructions at home: Medicines  Take over-the-counter and prescription medicines only as told by your health care provider.  Talk with your health care provider before you take any medicines that contain aspirin or NSAIDs. These medicines increase your risk for dangerous bleeding. General instructions  Change or remove your dressing as told  by your health care provider.  Return to your normal activities as told by your health care provider. Ask your health care provider what activities are safe for you.  Do not take baths, swim, or use a hot tub until your health care provider approves. Ask your health care provider if you may take showers.  Check your IV site every day for signs of infection. Check for: ? Redness, swelling, or pain. ? Fluid or blood. If fluid or blood drains from your IV site, use your hands to press down firmly on a bandage covering the area for a minute or two. Doing this should stop the bleeding. ? Warmth. ? Pus or a bad smell.  Keep all follow-up visits as told by your health care provider. This is important. Contact a health care provider if you have:  A headache that does not go away with medicine.  Hives, rash, or itchy skin.  Nausea or vomiting.  Unusual tiredness or weakness.  Signs of infection at your IV site. Get help right away if:  You have a fever or chills.  You urinate less often than usual.  Your urine is darker colored than normal.  You have any of the following: ? Trouble breathing. ? Pain in your back, abdomen, or chest. ? Cool, clammy skin. ? A fast heartbeat. Summary  Platelets are tiny pieces of blood cells that clump together to form a blood clot when you have an injury. If you have too few platelets, your blood may have trouble clotting.  A platelet transfusion is a procedure in which you receive donated platelets through an IV.  A platelet transfusion may be used to stop or prevent excessive bleeding.  After the procedure, check your IV site every day for signs of infection, including redness, swelling, pain, or warmth. This information is not intended to replace advice given to you by your health care provider. Make sure you discuss any questions you have with your health care provider. Document Released: 11/10/2006 Document Revised: 02/18/2017 Document  Reviewed: 02/18/2017 Elsevier Patient Education  2020 [place].    Blood Transfusion, Adult, Care After This sheet gives you information about how to care for yourself after your procedure. Your doctor may also give you more specific instructions. If you have problems or questions, contact your doctor. Follow these instructions at home:   Take over-the-counter and prescription medicines only as told by your doctor.  Go back to your normal activities as told by your doctor.  Follow instructions from your doctor about how to take care of the area where an IV tube was put into your vein (insertion site). Make sure you: ? Wash your hands with soap and water before you change your bandage (dressing). If there is no soap and water, use hand sanitizer. ? Change your bandage as told by your doctor.  Check your IV insertion site every day for signs of infection. Check for: ? More redness, swelling, or pain. ? More fluid or blood. ? Warmth. ? Pus or a bad smell. Contact a doctor if:  You have more redness, swelling, or pain around the IV insertion site.  You have more fluid or blood coming from the IV insertion site.  Your IV insertion site feels warm to the touch.  You have pus  or a bad smell coming from the IV insertion site.  Your pee (urine) turns pink, red, or brown.  You feel weak after doing your normal activities. Get help right away if:  You have signs of a serious allergic or body defense (immune) system reaction, including: ? Itchiness. ? Hives. ? Trouble breathing. ? Anxiety. ? Pain in your chest or lower back. ? Fever, flushing, and chills. ? Fast pulse. ? Rash. ? Watery poop (diarrhea). ? Throwing up (vomiting). ? Dark pee. ? Serious headache. ? Dizziness. ? Stiff neck. ? Yellow color in your face or the white parts of your eyes (jaundice). Summary  After a blood transfusion, return to your normal activities as told by your doctor.  Every day, check  for signs of infection where the IV tube was put into your vein.  Some signs of infection are warm skin, more redness and pain, more fluid or blood, and pus or a bad smell where the needle went in.  Contact your doctor if you feel weak or have any unusual symptoms. This information is not intended to replace advice given to you by your health care provider. Make sure you discuss any questions you have with your health care provider. Document Released: 02/03/2014 Document Revised: 05/20/2017 Document Reviewed: 09/07/2015 Elsevier Patient Education  2020 [employer].

## 2018-09-09 NOTE — Addendum Note (Signed)
Addended by: Tania Ade on: 09/09/2018 04:54 PM   Modules accepted: Orders

## 2018-09-10 LAB — TYPE AND SCREEN
ABO/RH(D): A POS
Antibody Screen: NEGATIVE
Unit division: 0

## 2018-09-10 LAB — PREPARE PLATELET PHERESIS: Unit division: 0

## 2018-09-10 LAB — BPAM PLATELET PHERESIS
Blood Product Expiration Date: 202008152359
ISSUE DATE / TIME: 202008131328
Unit Type and Rh: 6200

## 2018-09-10 LAB — BPAM RBC
Blood Product Expiration Date: 202009092359
ISSUE DATE / TIME: 202008131427
Unit Type and Rh: 6200

## 2018-09-13 ENCOUNTER — Other Ambulatory Visit: Payer: Self-pay

## 2018-09-13 ENCOUNTER — Inpatient Hospital Stay: Payer: Medicare Other

## 2018-09-13 DIAGNOSIS — J449 Chronic obstructive pulmonary disease, unspecified: Secondary | ICD-10-CM | POA: Diagnosis not present

## 2018-09-13 DIAGNOSIS — D61818 Other pancytopenia: Secondary | ICD-10-CM | POA: Diagnosis not present

## 2018-09-13 DIAGNOSIS — D696 Thrombocytopenia, unspecified: Secondary | ICD-10-CM

## 2018-09-13 DIAGNOSIS — N189 Chronic kidney disease, unspecified: Secondary | ICD-10-CM | POA: Diagnosis not present

## 2018-09-13 DIAGNOSIS — C9000 Multiple myeloma not having achieved remission: Secondary | ICD-10-CM

## 2018-09-13 DIAGNOSIS — D631 Anemia in chronic kidney disease: Secondary | ICD-10-CM | POA: Diagnosis not present

## 2018-09-13 DIAGNOSIS — D563 Thalassemia minor: Secondary | ICD-10-CM | POA: Diagnosis not present

## 2018-09-13 LAB — CBC WITH DIFFERENTIAL (CANCER CENTER ONLY)
Abs Immature Granulocytes: 0.01 10*3/uL (ref 0.00–0.07)
Basophils Absolute: 0 10*3/uL (ref 0.0–0.1)
Basophils Relative: 0 %
Eosinophils Absolute: 0 10*3/uL (ref 0.0–0.5)
Eosinophils Relative: 1 %
HCT: 26.6 % — ABNORMAL LOW (ref 39.0–52.0)
Hemoglobin: 8.4 g/dL — ABNORMAL LOW (ref 13.0–17.0)
Immature Granulocytes: 1 %
Lymphocytes Relative: 29 %
Lymphs Abs: 0.6 10*3/uL — ABNORMAL LOW (ref 0.7–4.0)
MCH: 25.2 pg — ABNORMAL LOW (ref 26.0–34.0)
MCHC: 31.6 g/dL (ref 30.0–36.0)
MCV: 79.9 fL — ABNORMAL LOW (ref 80.0–100.0)
Monocytes Absolute: 0.1 10*3/uL (ref 0.1–1.0)
Monocytes Relative: 3 %
Neutro Abs: 1.4 10*3/uL — ABNORMAL LOW (ref 1.7–7.7)
Neutrophils Relative %: 66 %
Platelet Count: 21 10*3/uL — ABNORMAL LOW (ref 150–400)
RBC: 3.33 MIL/uL — ABNORMAL LOW (ref 4.22–5.81)
RDW: 18.7 % — ABNORMAL HIGH (ref 11.5–15.5)
WBC Count: 2.1 10*3/uL — ABNORMAL LOW (ref 4.0–10.5)
nRBC: 0 % (ref 0.0–0.2)

## 2018-09-14 ENCOUNTER — Telehealth: Payer: Self-pay | Admitting: Oncology

## 2018-09-14 ENCOUNTER — Other Ambulatory Visit: Payer: Self-pay | Admitting: *Deleted

## 2018-09-14 DIAGNOSIS — D649 Anemia, unspecified: Secondary | ICD-10-CM

## 2018-09-14 DIAGNOSIS — C9000 Multiple myeloma not having achieved remission: Secondary | ICD-10-CM

## 2018-09-14 NOTE — Telephone Encounter (Signed)
Scheduled appt per 8/18 sch message- pt daughter is aware of appt date and time

## 2018-09-15 ENCOUNTER — Other Ambulatory Visit: Payer: Self-pay | Admitting: *Deleted

## 2018-09-15 ENCOUNTER — Other Ambulatory Visit: Payer: Self-pay | Admitting: Medical

## 2018-09-15 ENCOUNTER — Inpatient Hospital Stay: Payer: Medicare Other

## 2018-09-15 ENCOUNTER — Other Ambulatory Visit: Payer: Self-pay

## 2018-09-15 ENCOUNTER — Inpatient Hospital Stay (HOSPITAL_BASED_OUTPATIENT_CLINIC_OR_DEPARTMENT_OTHER): Payer: Medicare Other | Admitting: Medical

## 2018-09-15 ENCOUNTER — Ambulatory Visit (HOSPITAL_COMMUNITY)
Admission: RE | Admit: 2018-09-15 | Discharge: 2018-09-15 | Disposition: A | Discharge status: Home / Self Care | Payer: Medicare Other | Source: Ambulatory Visit | Attending: Medical | Admitting: Medical

## 2018-09-15 ENCOUNTER — Telehealth: Payer: Self-pay | Admitting: *Deleted

## 2018-09-15 VITALS — BP 128/59 | HR 75 | Temp 98.2°F | Resp 24

## 2018-09-15 DIAGNOSIS — J441 Chronic obstructive pulmonary disease with (acute) exacerbation: Secondary | ICD-10-CM

## 2018-09-15 DIAGNOSIS — J449 Chronic obstructive pulmonary disease, unspecified: Secondary | ICD-10-CM | POA: Diagnosis not present

## 2018-09-15 DIAGNOSIS — R059 Cough, unspecified: Secondary | ICD-10-CM

## 2018-09-15 DIAGNOSIS — C9000 Multiple myeloma not having achieved remission: Secondary | ICD-10-CM

## 2018-09-15 DIAGNOSIS — R05 Cough: Secondary | ICD-10-CM | POA: Insufficient documentation

## 2018-09-15 DIAGNOSIS — R0600 Dyspnea, unspecified: Secondary | ICD-10-CM | POA: Diagnosis not present

## 2018-09-15 DIAGNOSIS — D649 Anemia, unspecified: Secondary | ICD-10-CM

## 2018-09-15 DIAGNOSIS — D61818 Other pancytopenia: Secondary | ICD-10-CM | POA: Diagnosis not present

## 2018-09-15 DIAGNOSIS — N189 Chronic kidney disease, unspecified: Secondary | ICD-10-CM | POA: Diagnosis not present

## 2018-09-15 DIAGNOSIS — I5020 Unspecified systolic (congestive) heart failure: Secondary | ICD-10-CM

## 2018-09-15 DIAGNOSIS — D563 Thalassemia minor: Secondary | ICD-10-CM | POA: Diagnosis not present

## 2018-09-15 DIAGNOSIS — D631 Anemia in chronic kidney disease: Secondary | ICD-10-CM | POA: Diagnosis not present

## 2018-09-15 DIAGNOSIS — D508 Other iron deficiency anemias: Secondary | ICD-10-CM | POA: Diagnosis not present

## 2018-09-15 DIAGNOSIS — E538 Deficiency of other specified B group vitamins: Secondary | ICD-10-CM

## 2018-09-15 DIAGNOSIS — R0602 Shortness of breath: Secondary | ICD-10-CM | POA: Diagnosis not present

## 2018-09-15 LAB — CBC WITH DIFFERENTIAL (CANCER CENTER ONLY)
Abs Immature Granulocytes: 0.01 10*3/uL (ref 0.00–0.07)
Basophils Absolute: 0 10*3/uL (ref 0.0–0.1)
Basophils Relative: 0 %
Eosinophils Absolute: 0 10*3/uL (ref 0.0–0.5)
Eosinophils Relative: 0 %
HCT: 25.9 % — ABNORMAL LOW (ref 39.0–52.0)
Hemoglobin: 8.2 g/dL — ABNORMAL LOW (ref 13.0–17.0)
Immature Granulocytes: 0 %
Lymphocytes Relative: 17 %
Lymphs Abs: 0.4 10*3/uL — ABNORMAL LOW (ref 0.7–4.0)
MCH: 25.3 pg — ABNORMAL LOW (ref 26.0–34.0)
MCHC: 31.7 g/dL (ref 30.0–36.0)
MCV: 79.9 fL — ABNORMAL LOW (ref 80.0–100.0)
Monocytes Absolute: 0.1 10*3/uL (ref 0.1–1.0)
Monocytes Relative: 2 %
Neutro Abs: 2.1 10*3/uL (ref 1.7–7.7)
Neutrophils Relative %: 81 %
Platelet Count: 15 10*3/uL — ABNORMAL LOW (ref 150–400)
RBC: 3.24 MIL/uL — ABNORMAL LOW (ref 4.22–5.81)
RDW: 18.4 % — ABNORMAL HIGH (ref 11.5–15.5)
WBC Count: 2.6 10*3/uL — ABNORMAL LOW (ref 4.0–10.5)
nRBC: 0 % (ref 0.0–0.2)

## 2018-09-15 LAB — PREPARE RBC (CROSSMATCH)

## 2018-09-15 LAB — SAMPLE TO BLOOD BANK

## 2018-09-15 MED ORDER — EPOETIN ALFA 20000 UNIT/ML IJ SOLN
INTRAMUSCULAR | Status: AC
  Filled 2018-09-15: qty 1

## 2018-09-15 MED ORDER — SODIUM CHLORIDE 0.9% IV SOLUTION
250.0000 mL | Freq: Once | INTRAVENOUS | Status: AC
  Administered 2018-09-15: 250 mL via INTRAVENOUS
  Filled 2018-09-15: qty 250

## 2018-09-15 MED ORDER — SODIUM CHLORIDE 0.9% IV SOLUTION
250.0000 mL | Freq: Once | INTRAVENOUS | Status: DC
  Filled 2018-09-15: qty 250

## 2018-09-15 MED ORDER — EPOETIN ALFA 20000 UNIT/ML IJ SOLN
60000.0000 [IU] | Freq: Once | INTRAMUSCULAR | Status: AC
  Administered 2018-09-15: 14:00:00 60000 [IU] via SUBCUTANEOUS

## 2018-09-15 NOTE — Progress Notes (Signed)
Way back no new lower extremity pain in the right knee on the right-year-old historian.  History and this is been nicely 0.40 724-minute follow-up and if they know.  #4  [facility] Progress Note   Serapio Edelson, [name] [name] [id number] 18-May-1925 85 y.o.  [name] Azure, [name] [name] is managed by Dr. Dominica Severin B. Sherrill  Actively treated with chemotherapy/immunotherapy/hormonal therapy: yes  Current therapy: [name] and prednisone  Next scheduled appointment with provider: 09/20/2018  Assessment: Plan:    Multiple myeloma without remission: The patient continues to be followed by Dr. Dominica Severin B. [name] is currently treated with melphalan and prednisone.  Anemia: The patient is receiving Procrit 60,000 units subcu today and 1 unit of packed red blood cells.  History of dyspnea in the setting of COPD, congestive heart failure, and anemia: Patient was referred for a chest x-ray today which showed findings consistent with his history of COPD but with no evidence of acute exacerbation.  Please see After Visit Summary for patient specific instructions.  Future Appointments  Date Time Provider [place]  09/20/2018  2:45 PM [facility] LAB 4 [facility] None  09/20/2018  3:15 PM Owens Shark, NP [facility] None  09/20/2018  3:45 PM [facility] [place] FLUSH [facility] None  09/28/2018  8:35 AM [facility] DEVICE REMOTES [facility]  11/23/2018  7:35 AM [facility] DEVICE REMOTES [facility]  02/22/2019  7:35 AM [facility] DEVICE REMOTES [facility]  05/24/2019  7:35 AM [facility] DEVICE REMOTES [facility]  08/23/2019  7:35 AM [facility] DEVICE REMOTES [facility]  11/22/2019  7:35 AM [facility] DEVICE REMOTES [facility]    No orders of the defined types were placed in this encounter.      Subjective:   Patient ID:  [name] Azure, [name] [name] is a 85 y.o. (DOB 07-12-1925) male.  Chief Complaint: No chief  complaint on file.   HPI [name] Azure, [name] [name] is a 85 year old male with a history of multiple myeloma who is managed by Dr. Dominica Severin B. Sherrill and is currently receiving melphalan and prednisone.  He also has a history of anemia with a CBC returned today with a hemoglobin of 8.2.  He is receiving Procrit 60,000 units subcu today and is receiving a transfusion of 1 unit of packed red blood cells.  He has a history of anemia, congestive heart failure, and COPD.  He reports that he has been noticing increasing shortness of breath when getting up in the morning.  He reports that this usually lasts momentarily however recently had a second 20 to 30 minutes to catch his breath.  He has supplemental oxygen and and also has inhalers.  Medications: I have reviewed the patient's current medications.  Allergies: No Known Allergies  Past Medical History:  Diagnosis Date  . Anemia, unspecified   . CAD (coronary artery disease)   . Cataracts, bilateral   . CHF (congestive heart failure) ([place])   . Chronic airway obstruction, not elsewhere classified    on o2 at night  . Diabetes mellitus   . Diverticulosis of colon (without mention of hemorrhage)   . Dyslipidemia   . Emphysema   . Hemorrhoids   . ICD (implantable cardiac defibrillator) in place    [facility]  . Ischemic cardiomyopathy   . Macular degeneration   . MI, old 2010 or 2011  . Prostatic hypertrophy    hx of  . Retention of urine, unspecified   . Unspecified disorder resulting from impaired renal function     Past Surgical History:  Procedure  Laterality Date  . CARDIAC DEFIBRILLATOR PLACEMENT  2011   [facility]  . CATARACT EXTRACTION    . PACEMAKER INSERTION  2011  . pci     4/11  . TONSILLECTOMY  85 yo    Family History  Problem Relation Age of Onset  . Coronary artery disease Mother   . Stroke Mother   . Congestive Heart Failure Mother   . Lung cancer Father        smoker  . Breast cancer Sister   . Atrial fibrillation  Daughter   . Heart attack Brother     Social History   Socioeconomic History  . Marital status: Widowed    Spouse name: Not on file  . Number of children: Not on file  . Years of education: Not on file  . Highest education level: Not on file  Occupational History  . 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 Financial resource strain: Not on file  . Food insecurity    Worry: Not on file    Inability: Not on file  . Transportation needs    Medical: Not on file    Non-medical: Not on file  Tobacco Use  . Smoking status: Former Smoker    Types: Cigarettes    Quit date: 04/27/2009    Years since quitting: 9.3  . Smokeless tobacco: Never Used  Substance and Sexual Activity  . Alcohol use: Yes    Comment: occasional beer  . Drug use: No  . Sexual activity: Never  Lifestyle  . Physical activity    Days per week: Not on file    Minutes per session: Not on file  . Stress: Not on file  Relationships  . Social Herbalist on phone: Not on file    Gets together: Not on file    Attends religious service: Not on file    Active member of club or organization: Not on file    Attends meetings of clubs or organizations: Not on file    Relationship status: Not on file  . Intimate partner violence    Fear of current or ex partner: Not on file    Emotionally abused: Not on file    Physically abused: Not on file    Forced sexual activity: Not on file  Other Topics Concern  . Not on file  Social History Narrative   Lives in a house with stairs, which he needs to use, with his wife.  Wife is not able-bodied.  Does not use a cane or walker, but is unsteady on his feet.        [name] Bracco:  [phone] cell, [phone], daughter, POA.     Boyd Kerbs:  [phone], nephew   Juluis Pitch:  [phone], daughter          Past Medical History, Surgical history, Social history, and Family history were reviewed and updated as appropriate.    Please see review of systems for further details on the patient's review from today.   Review of Systems:  Review of Systems  Constitutional: Positive for fatigue. Negative for chills, diaphoresis and fever.  HENT: Negative for congestion, postnasal drip, rhinorrhea and sore throat.   Respiratory: Positive for shortness of breath. Negative for cough and wheezing.   Cardiovascular: Negative for palpitations.  Neurological: Negative for headaches.    Objective:   Physical Exam:  There were no vitals taken for this visit. ECOG: 1  Physical  Exam Constitutional:      General: He is not in acute distress.    Appearance: He is not diaphoretic.  HENT:     Head: Normocephalic and atraumatic.     Right Ear: External ear normal.     Left Ear: External ear normal.  Cardiovascular:     Rate and Rhythm: Normal rate and regular rhythm.     Heart sounds: Normal heart sounds. No murmur. No friction rub. No gallop.   Pulmonary:     Effort: Pulmonary effort is normal. No respiratory distress.     Breath sounds: Normal breath sounds. No wheezing or rales.  Skin:    General: Skin is warm and dry.     Findings: No erythema or rash.  Neurological:     Mental Status: He is alert.  Psychiatric:        Behavior: Behavior normal.        Thought Content: Thought content normal.        Judgment: Judgment normal.     Lab Review:     Component Value Date/Time   NA 144 09/06/2018 1458   NA 146 (H) 12/02/2017 1553   NA 140 01/12/2017 1538   K 4.2 09/06/2018 1458   K 4.4 01/12/2017 1538   CL 107 09/06/2018 1458   CO2 26 09/06/2018 1458   CO2 27 01/12/2017 1538   GLUCOSE 118 (H) 09/06/2018 1458   GLUCOSE 84 01/12/2017 1538   BUN 37 (H) 09/06/2018 1458   BUN 30 12/02/2017 1553   BUN 23.7 01/12/2017 1538   CREATININE 1.26 (H) 09/06/2018 1458   CREATININE 1.0 01/12/2017 1538   CALCIUM 8.8 (L) 09/06/2018 1458   CALCIUM 8.7 01/12/2017 1538   PROT 6.7 09/06/2018 1458   PROT 5.7 (L) 01/12/2017  1538   PROT 6.1 (L) 01/12/2017 1538   ALBUMIN 3.1 (L) 09/06/2018 1458   ALBUMIN 2.9 (L) 01/12/2017 1538   AST 11 (L) 09/06/2018 1458   AST 11 01/12/2017 1538   ALT 9 09/06/2018 1458   ALT 12 01/12/2017 1538   ALKPHOS 53 09/06/2018 1458   ALKPHOS 49 01/12/2017 1538   BILITOT 0.4 09/06/2018 1458   BILITOT 0.47 01/12/2017 1538   GFRNONAA 50 (L) 09/06/2018 1458   GFRAA 57 (L) 09/06/2018 1458       Component Value Date/Time   WBC 2.6 (L) 09/15/2018 1249   WBC 4.2 07/28/2018 1509   RBC 3.24 (L) 09/15/2018 1249   HGB 8.2 (L) 09/15/2018 1249   HGB 7.4 (L) 01/12/2017 1538   HCT 25.9 (L) 09/15/2018 1249   HCT 23.8 (L) 01/12/2017 1538   PLT 15 (L) 09/15/2018 1249   PLT 89 (L) 01/12/2017 1538   MCV 79.9 (L) 09/15/2018 1249   MCV 74.3 (L) 01/12/2017 1538   MCH 25.3 (L) 09/15/2018 1249   MCHC 31.7 09/15/2018 1249   RDW 18.4 (H) 09/15/2018 1249   RDW 21.0 (H) 01/12/2017 1538   LYMPHSABS 0.4 (L) 09/15/2018 1249   LYMPHSABS 0.6 (L) 01/12/2017 1538   MONOABS 0.1 09/15/2018 1249   MONOABS 0.3 01/12/2017 1538   EOSABS 0.0 09/15/2018 1249   EOSABS 0.1 01/12/2017 1538   BASOSABS 0.0 09/15/2018 1249   BASOSABS 0.1 01/12/2017 1538   -------------------------------  Imaging from last 24 hours (if applicable):  Radiology interpretation: Dg Chest 2 View  Result Date: 09/15/2018 CLINICAL DATA:  Cough, shortness of breath EXAM: CHEST - 2 VIEW COMPARISON:  07/28/2018 FINDINGS: Stable positioning of left-sided implanted cardiac device. Heart size  is normal. Aorta is calcified and tortuous. Lungs are hyperexpanded with flattening of the diaphragms and coarsened interstitial markings compatible with chronic emphysema/COPD. No superimposed airspace consolidation. No pleural effusion or pneumothorax. IMPRESSION: 1. No acute cardiopulmonary findings. 2. Stigmata of emphysema. Electronically Signed   By: Davina Poke M.D.   On: 09/15/2018 12:44        This case was discussed with Dr. [name]. He  expressed agreement with my management of this patient.

## 2018-09-15 NOTE — Patient Instructions (Signed)
Blood Transfusion, Adult, Care After This sheet gives you information about how to care for yourself after your procedure. Your health care provider may also give you more specific instructions. If you have problems or questions, contact your health care provider. What can I expect after the procedure? After your procedure, it is common to have:  Bruising and soreness where the IV tube was inserted.  Headache. Follow these instructions at home:   Take over-the-counter and prescription medicines only as told by your health care provider.  Return to your normal activities as told by your health care provider.  Follow instructions from your health care provider about how to take care of your IV insertion site. Make sure you: ? Wash your hands with soap and water before you change your bandage (dressing). If soap and water are not available, use hand sanitizer. ? Change your dressing as told by your health care provider.  Check your IV insertion site every day for signs of infection. Check for: ? More redness, swelling, or pain. ? More fluid or blood. ? Warmth. ? Pus or a bad smell. Contact a health care provider if:  You have more redness, swelling, or pain around the IV insertion site.  You have more fluid or blood coming from the IV insertion site.  Your IV insertion site feels warm to the touch.  You have pus or a bad smell coming from the IV insertion site.  Your urine turns pink, red, or brown.  You feel weak after doing your normal activities. Get help right away if:  You have signs of a serious allergic or immune system reaction, including: ? Itchiness. ? Hives. ? Trouble breathing. ? Anxiety. ? Chest or lower back pain. ? Fever, flushing, and chills. ? Rapid pulse. ? Rash. ? Diarrhea. ? Vomiting. ? Dark urine. ? Serious headache. ? Dizziness. ? Stiff neck. ? Yellow coloration of the face or the white parts of the eyes (jaundice). This information is not  intended to replace advice given to you by your health care provider. Make sure you discuss any questions you have with your health care provider. Document Released: 02/03/2014 Document Revised: 11/10/2016 Document Reviewed: 07/30/2015 Elsevier Patient Education  2020 Elsevier Inc.  

## 2018-09-15 NOTE — Telephone Encounter (Signed)
Daughter left VM that he is having a lot of trouble with his breathing today, despite his inhalers/nebulizer. Asking if can come in earlier to be evaluated? Does not want to send him to ER. Per Dr. [name] :Will be seen in [facility] when here for blood. Instructed daughter to have him go to [facility] radiology for CXR at 12:30, and then come to [facility] for lab/blood/visit.

## 2018-09-16 ENCOUNTER — Telehealth: Payer: Self-pay | Admitting: *Deleted

## 2018-09-16 ENCOUNTER — Telehealth: Payer: Self-pay | Admitting: Oncology

## 2018-09-16 ENCOUNTER — Other Ambulatory Visit: Payer: Self-pay | Admitting: *Deleted

## 2018-09-16 DIAGNOSIS — C9000 Multiple myeloma not having achieved remission: Secondary | ICD-10-CM

## 2018-09-16 DIAGNOSIS — D649 Anemia, unspecified: Secondary | ICD-10-CM

## 2018-09-16 LAB — BPAM RBC
Blood Product Expiration Date: 202009152359
Blood Product Expiration Date: 202009152359
ISSUE DATE / TIME: 202008191429
ISSUE DATE / TIME: 202008200724
Unit Type and Rh: 6200
Unit Type and Rh: 6200

## 2018-09-16 LAB — TYPE AND SCREEN
ABO/RH(D): A POS
Antibody Screen: NEGATIVE
Unit division: 0
Unit division: 0

## 2018-09-16 NOTE — Progress Notes (Signed)
MD order for repeat CBC on 09/17/18 due to low platelet count on 8/19. High priority scheduling message sent.

## 2018-09-16 NOTE — Telephone Encounter (Signed)
Scheduled appt per 8/20 sch message - pt daughter is aware of appt date and time . Wants to know more about fathers care - msg sent to RN and MD to see if she can be called,

## 2018-09-16 NOTE — Telephone Encounter (Signed)
Error

## 2018-09-17 ENCOUNTER — Inpatient Hospital Stay: Payer: Medicare Other

## 2018-09-17 ENCOUNTER — Other Ambulatory Visit: Payer: Self-pay

## 2018-09-17 ENCOUNTER — Other Ambulatory Visit: Payer: Self-pay | Admitting: *Deleted

## 2018-09-17 DIAGNOSIS — J449 Chronic obstructive pulmonary disease, unspecified: Secondary | ICD-10-CM | POA: Diagnosis not present

## 2018-09-17 DIAGNOSIS — D696 Thrombocytopenia, unspecified: Secondary | ICD-10-CM

## 2018-09-17 DIAGNOSIS — C9000 Multiple myeloma not having achieved remission: Secondary | ICD-10-CM

## 2018-09-17 DIAGNOSIS — D563 Thalassemia minor: Secondary | ICD-10-CM | POA: Diagnosis not present

## 2018-09-17 DIAGNOSIS — N189 Chronic kidney disease, unspecified: Secondary | ICD-10-CM | POA: Diagnosis not present

## 2018-09-17 DIAGNOSIS — D631 Anemia in chronic kidney disease: Secondary | ICD-10-CM | POA: Diagnosis not present

## 2018-09-17 DIAGNOSIS — D61818 Other pancytopenia: Secondary | ICD-10-CM | POA: Diagnosis not present

## 2018-09-17 DIAGNOSIS — D649 Anemia, unspecified: Secondary | ICD-10-CM

## 2018-09-17 LAB — CBC WITH DIFFERENTIAL (CANCER CENTER ONLY)
Abs Immature Granulocytes: 0.02 10*3/uL (ref 0.00–0.07)
Basophils Absolute: 0 10*3/uL (ref 0.0–0.1)
Basophils Relative: 0 %
Eosinophils Absolute: 0.1 10*3/uL (ref 0.0–0.5)
Eosinophils Relative: 3 %
HCT: 27.7 % — ABNORMAL LOW (ref 39.0–52.0)
Hemoglobin: 8.9 g/dL — ABNORMAL LOW (ref 13.0–17.0)
Immature Granulocytes: 1 %
Lymphocytes Relative: 28 %
Lymphs Abs: 0.6 10*3/uL — ABNORMAL LOW (ref 0.7–4.0)
MCH: 25.9 pg — ABNORMAL LOW (ref 26.0–34.0)
MCHC: 32.1 g/dL (ref 30.0–36.0)
MCV: 80.8 fL (ref 80.0–100.0)
Monocytes Absolute: 0 10*3/uL — ABNORMAL LOW (ref 0.1–1.0)
Monocytes Relative: 2 %
Neutro Abs: 1.3 10*3/uL — ABNORMAL LOW (ref 1.7–7.7)
Neutrophils Relative %: 66 %
Platelet Count: 12 10*3/uL — ABNORMAL LOW (ref 150–400)
RBC: 3.43 MIL/uL — ABNORMAL LOW (ref 4.22–5.81)
RDW: 17.9 % — ABNORMAL HIGH (ref 11.5–15.5)
WBC Count: 2 10*3/uL — ABNORMAL LOW (ref 4.0–10.5)
nRBC: 1 % — ABNORMAL HIGH (ref 0.0–0.2)

## 2018-09-17 NOTE — Progress Notes (Signed)
Spoke with daughter regarding labs today and Dr. [name] feels he should have platelets transfused tomorrow. She agrees to transfusion and will bring him in. Sent high priority scheduling message for appointment tomorrow and to call daughter.  Blood bank has platelet in house, but requested order be re-entered to allow to prepare today. Re-entered orders, but will only need 1 unit.

## 2018-09-18 ENCOUNTER — Inpatient Hospital Stay: Payer: Medicare Other

## 2018-09-18 DIAGNOSIS — C9000 Multiple myeloma not having achieved remission: Secondary | ICD-10-CM

## 2018-09-18 DIAGNOSIS — D696 Thrombocytopenia, unspecified: Secondary | ICD-10-CM

## 2018-09-18 DIAGNOSIS — D61818 Other pancytopenia: Secondary | ICD-10-CM | POA: Diagnosis not present

## 2018-09-18 DIAGNOSIS — D563 Thalassemia minor: Secondary | ICD-10-CM | POA: Diagnosis not present

## 2018-09-18 DIAGNOSIS — D631 Anemia in chronic kidney disease: Secondary | ICD-10-CM | POA: Diagnosis not present

## 2018-09-18 DIAGNOSIS — N189 Chronic kidney disease, unspecified: Secondary | ICD-10-CM | POA: Diagnosis not present

## 2018-09-18 DIAGNOSIS — J449 Chronic obstructive pulmonary disease, unspecified: Secondary | ICD-10-CM | POA: Diagnosis not present

## 2018-09-18 MED ORDER — SODIUM CHLORIDE 0.9% IV SOLUTION
250.0000 mL | Freq: Once | INTRAVENOUS | Status: DC
  Filled 2018-09-18: qty 250

## 2018-09-18 MED ORDER — SODIUM CHLORIDE 0.9% IV SOLUTION
250.0000 mL | Freq: Once | INTRAVENOUS | Status: AC
  Administered 2018-09-18: 250 mL via INTRAVENOUS
  Filled 2018-09-18: qty 250

## 2018-09-18 NOTE — Patient Instructions (Signed)
Platelet Transfusion A platelet transfusion is a procedure in which you receive donated platelets through an IV. Platelets are tiny pieces of blood cells. When you get an injury, platelets clump together in the area to form a blood clot. This helps stop bleeding and is the beginning of the healing process. If you have too few platelets, your blood may have trouble clotting. This may cause you to bleed and bruise very easily. You may need a platelet transfusion if you have a condition that causes a low number of platelets (thrombocytopenia). A platelet transfusion may be used to stop or prevent excessive bleeding. Tell a health care provider about:  Any reactions you have had during previous transfusions.  Any allergies you have.  All medicines you are taking, including vitamins, herbs, eye drops, creams, and over-the-counter medicines.  Any blood disorders you have.  Any surgeries you have had.  Any medical conditions you have.  Whether you are pregnant or may be pregnant. What are the risks? Generally, this is a safe procedure. However, problems may occur, including:  Fever.  Infection.  Allergic reaction to the donor platelets.  Your body's disease-fighting system (immune system) attacking the donor platelets (hemolytic reaction). This is rare.  A rare reaction that causes lung damage (transfusion-related acute lung injury). What happens before the procedure? Medicines  Ask your health care provider about: ? Changing or stopping your regular medicines. This is especially important if you are taking diabetes medicines or blood thinners. ? Taking medicines such as aspirin and ibuprofen. These medicines can thin your blood. Do not take these medicines unless your health care provider tells you to take them. ? Taking over-the-counter medicines, vitamins, herbs, and supplements. General instructions  You will have a blood test to determine your blood type. Your blood type  determines what kind of platelets you will be given.  Follow instructions from your health care provider about eating or drinking restrictions.  If you have had an allergic reaction to a transfusion in the past, you may be given medicine to help prevent a reaction.  Your temperature, blood pressure, pulse, and breathing will be monitored. What happens during the procedure?   An IV will be inserted into one of your veins.  For your safety, two health care providers will verify your identity along with the donor platelets about to be infused.  A bag of donor platelets will be connected to your IV. The platelets will flow into your bloodstream. This usually takes 30-60 minutes.  Your temperature, blood pressure, pulse, and breathing will be monitored during the transfusion. This helps detect early signs of any reaction.  You will also be monitored for other symptoms that may indicate a reaction, including chills, hives, or itching.  If you have signs of a reaction at any time, your transfusion will be stopped, and you may be given medicine to help manage the reaction.  When your transfusion is complete, your IV will be removed.  Pressure may be applied to the IV site for a few minutes to stop any bleeding.  The IV site will be covered with a bandage (dressing). The procedure may vary among health care providers and hospitals. What happens after the procedure?  Your blood pressure, temperature, pulse, and breathing will be monitored until you leave the hospital or clinic.  You may have some bruising and soreness at your IV site. Follow these instructions at home: Medicines  Take over-the-counter and prescription medicines only as told by your health care provider.  Talk with your health care provider before you take any medicines that contain aspirin or NSAIDs. These medicines increase your risk for dangerous bleeding. General instructions  Change or remove your dressing as told  by your health care provider.  Return to your normal activities as told by your health care provider. Ask your health care provider what activities are safe for you.  Do not take baths, swim, or use a hot tub until your health care provider approves. Ask your health care provider if you may take showers.  Check your IV site every day for signs of infection. Check for: ? Redness, swelling, or pain. ? Fluid or blood. If fluid or blood drains from your IV site, use your hands to press down firmly on a bandage covering the area for a minute or two. Doing this should stop the bleeding. ? Warmth. ? Pus or a bad smell.  Keep all follow-up visits as told by your health care provider. This is important. Contact a health care provider if you have:  A headache that does not go away with medicine.  Hives, rash, or itchy skin.  Nausea or vomiting.  Unusual tiredness or weakness.  Signs of infection at your IV site. Get help right away if:  You have a fever or chills.  You urinate less often than usual.  Your urine is darker colored than normal.  You have any of the following: ? Trouble breathing. ? Pain in your back, abdomen, or chest. ? Cool, clammy skin. ? A fast heartbeat. Summary  Platelets are tiny pieces of blood cells that clump together to form a blood clot when you have an injury. If you have too few platelets, your blood may have trouble clotting.  A platelet transfusion is a procedure in which you receive donated platelets through an IV.  A platelet transfusion may be used to stop or prevent excessive bleeding.  After the procedure, check your IV site every day for signs of infection, including redness, swelling, pain, or warmth. This information is not intended to replace advice given to you by your health care provider. Make sure you discuss any questions you have with your health care provider. Document Released: 11/10/2006 Document Revised: 02/18/2017 Document  Reviewed: 02/18/2017 Elsevier Patient Education  2020 Elsevier Inc.    Blood Transfusion, Adult, Care After This sheet gives you information about how to care for yourself after your procedure. Your doctor may also give you more specific instructions. If you have problems or questions, contact your doctor. Follow these instructions at home:   Take over-the-counter and prescription medicines only as told by your doctor.  Go back to your normal activities as told by your doctor.  Follow instructions from your doctor about how to take care of the area where an IV tube was put into your vein (insertion site). Make sure you: ? Wash your hands with soap and water before you change your bandage (dressing). If there is no soap and water, use hand sanitizer. ? Change your bandage as told by your doctor.  Check your IV insertion site every day for signs of infection. Check for: ? More redness, swelling, or pain. ? More fluid or blood. ? Warmth. ? Pus or a bad smell. Contact a doctor if:  You have more redness, swelling, or pain around the IV insertion site.  You have more fluid or blood coming from the IV insertion site.  Your IV insertion site feels warm to the touch.  You have pus   or a bad smell coming from the IV insertion site.  Your pee (urine) turns pink, red, or brown.  You feel weak after doing your normal activities. Get help right away if:  You have signs of a serious allergic or body defense (immune) system reaction, including: ? Itchiness. ? Hives. ? Trouble breathing. ? Anxiety. ? Pain in your chest or lower back. ? Fever, flushing, and chills. ? Fast pulse. ? Rash. ? Watery poop (diarrhea). ? Throwing up (vomiting). ? Dark pee. ? Serious headache. ? Dizziness. ? Stiff neck. ? Yellow color in your face or the white parts of your eyes (jaundice). Summary  After a blood transfusion, return to your normal activities as told by your doctor.  Every day, check  for signs of infection where the IV tube was put into your vein.  Some signs of infection are warm skin, more redness and pain, more fluid or blood, and pus or a bad smell where the needle went in.  Contact your doctor if you feel weak or have any unusual symptoms. This information is not intended to replace advice given to you by your health care provider. Make sure you discuss any questions you have with your health care provider. Document Released: 02/03/2014 Document Revised: 05/20/2017 Document Reviewed: 09/07/2015 Elsevier Patient Education  2020 Elsevier Inc.  

## 2018-09-20 ENCOUNTER — Other Ambulatory Visit: Payer: Medicare Other

## 2018-09-20 ENCOUNTER — Telehealth: Payer: Self-pay

## 2018-09-20 ENCOUNTER — Ambulatory Visit: Payer: Medicare Other

## 2018-09-20 ENCOUNTER — Encounter: Payer: Self-pay | Admitting: Nurse Practitioner

## 2018-09-20 ENCOUNTER — Other Ambulatory Visit: Payer: Self-pay

## 2018-09-20 ENCOUNTER — Inpatient Hospital Stay: Payer: Medicare Other

## 2018-09-20 ENCOUNTER — Inpatient Hospital Stay (HOSPITAL_BASED_OUTPATIENT_CLINIC_OR_DEPARTMENT_OTHER): Payer: Medicare Other | Admitting: Nurse Practitioner

## 2018-09-20 ENCOUNTER — Ambulatory Visit: Payer: Medicare Other | Admitting: Nurse Practitioner

## 2018-09-20 VITALS — BP 126/78 | HR 102 | Temp 98.9°F | Resp 17 | Ht 72.0 in | Wt 146.2 lb

## 2018-09-20 DIAGNOSIS — C9000 Multiple myeloma not having achieved remission: Secondary | ICD-10-CM

## 2018-09-20 DIAGNOSIS — I255 Ischemic cardiomyopathy: Secondary | ICD-10-CM

## 2018-09-20 DIAGNOSIS — D631 Anemia in chronic kidney disease: Secondary | ICD-10-CM | POA: Diagnosis not present

## 2018-09-20 DIAGNOSIS — N189 Chronic kidney disease, unspecified: Secondary | ICD-10-CM | POA: Diagnosis not present

## 2018-09-20 DIAGNOSIS — D563 Thalassemia minor: Secondary | ICD-10-CM | POA: Diagnosis not present

## 2018-09-20 DIAGNOSIS — D61818 Other pancytopenia: Secondary | ICD-10-CM | POA: Diagnosis not present

## 2018-09-20 DIAGNOSIS — J449 Chronic obstructive pulmonary disease, unspecified: Secondary | ICD-10-CM | POA: Diagnosis not present

## 2018-09-20 LAB — CMP (CANCER CENTER ONLY)
ALT: 26 U/L (ref 0–44)
AST: 12 U/L — ABNORMAL LOW (ref 15–41)
Albumin: 3.3 g/dL — ABNORMAL LOW (ref 3.5–5.0)
Alkaline Phosphatase: 77 U/L (ref 38–126)
Anion gap: 10 (ref 5–15)
BUN: 31 mg/dL — ABNORMAL HIGH (ref 8–23)
CO2: 25 mmol/L (ref 22–32)
Calcium: 8.5 mg/dL — ABNORMAL LOW (ref 8.9–10.3)
Chloride: 107 mmol/L (ref 98–111)
Creatinine: 1.16 mg/dL (ref 0.61–1.24)
GFR, Est AFR Am: >60 mL/min (ref >60)
GFR, Estimated: 55 mL/min — ABNORMAL LOW (ref >60)
Glucose, Bld: 131 mg/dL — ABNORMAL HIGH (ref 70–99)
Potassium: 3.9 mmol/L (ref 3.5–5.1)
Sodium: 142 mmol/L (ref 135–145)
Total Bilirubin: 0.5 mg/dL (ref 0.3–1.2)
Total Protein: 6.7 g/dL (ref 6.5–8.1)

## 2018-09-20 LAB — CBC WITH DIFFERENTIAL (CANCER CENTER ONLY)
Abs Immature Granulocytes: 0.01 10*3/uL (ref 0.00–0.07)
Basophils Absolute: 0 10*3/uL (ref 0.0–0.1)
Basophils Relative: 0 %
Eosinophils Absolute: 0 10*3/uL (ref 0.0–0.5)
Eosinophils Relative: 2 %
HCT: 27.8 % — ABNORMAL LOW (ref 39.0–52.0)
Hemoglobin: 8.8 g/dL — ABNORMAL LOW (ref 13.0–17.0)
Immature Granulocytes: 1 %
Lymphocytes Relative: 30 %
Lymphs Abs: 0.5 10*3/uL — ABNORMAL LOW (ref 0.7–4.0)
MCH: 25.7 pg — ABNORMAL LOW (ref 26.0–34.0)
MCHC: 31.7 g/dL (ref 30.0–36.0)
MCV: 81 fL (ref 80.0–100.0)
Monocytes Absolute: 0 10*3/uL — ABNORMAL LOW (ref 0.1–1.0)
Monocytes Relative: 2 %
Neutro Abs: 1 10*3/uL — ABNORMAL LOW (ref 1.7–7.7)
Neutrophils Relative %: 65 %
Platelet Count: 30 10*3/uL — ABNORMAL LOW (ref 150–400)
RBC: 3.43 MIL/uL — ABNORMAL LOW (ref 4.22–5.81)
RDW: 17.9 % — ABNORMAL HIGH (ref 11.5–15.5)
WBC Count: 1.6 10*3/uL — ABNORMAL LOW (ref 4.0–10.5)
nRBC: 0 % (ref 0.0–0.2)

## 2018-09-20 LAB — BPAM PLATELET PHERESIS
Blood Product Expiration Date: 202008242359
ISSUE DATE / TIME: 202008221057
Unit Type and Rh: 7300

## 2018-09-20 LAB — PREPARE PLATELET PHERESIS: Unit division: 0

## 2018-09-20 MED ORDER — PREDNISONE 20 MG PO TABS: ORAL_TABLET | ORAL | 0 refills | Status: DC

## 2018-09-20 NOTE — Progress Notes (Addendum)
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  He completed cycle 1 melphalan/prednisone beginning 08/16/2018.  He was transfused a unit of blood on 09/15/2018 and a unit of platelets on 09/18/2018.  He denies bleeding.  No fever, cough or wheezing.  He had a "bad morning" due to dyspnea on exertion.  He does not feel short of breath at present.  Objective:  Vital signs in last 24 hours:  Blood pressure 126/78, pulse (!) 102, temperature 98.9 F (37.2 C), temperature source Oral, resp. rate 17, height 6' (1.829 m), weight 146 lb 3.2 oz (66.3 kg), SpO2 100 %.   Resp: Distant breath sounds.  No respiratory distress.  No wheezing. Cardio: Distant heart sounds.  Regular rate and rhythm. GI: Abdomen soft and nontender. Vascular: No leg edema. Skin: Ecchymoses scattered over forearms.   Lab Results:  Lab Results  Component Value Date   WBC 1.6 (L) 09/20/2018   HGB 8.8 (L) 09/20/2018   HCT 27.8 (L) 09/20/2018   MCV 81.0 09/20/2018   PLT 30 (L) 09/20/2018   NEUTROABS 1.0 (L) 09/20/2018    Imaging:  No results found.  Medications: I have reviewed the patient's current medications.  Assessment/Plan: 1.Pancytopenia-transfused with packed red blood cells 11/28/2016, 12/27/2016, 01/13/2017, 02/02/2017 2. History of beta thalassemia trait 3. History of vitamin B-12 deficiency-vitamin B-12 replacement initiated 11/11/2016 4. History of a serum monoclonal IgAkappaprotein-elevated serum free kappa light chains, IgA, and M spike10/16/2018  Bone marrow biopsy 12/03/2016-consistent with multiple myeloma  Bone survey 12/06/2016-no destructive or lytic bone lesions identified.  Revlimid 21 days on/7 days off and weekly dexamethasone 12/16/2016(Decadron discontinued 12/29/2016 secondary to altered mental status)  Cycle 2 single agent Revlimid 01/14/2017(discontinued 3 days early)  Serum light chains,  IgAimproved 02/02/2017  Cycle3 single agent Revlimid beginning 02/16/2017  Cycle4single agent Revlimid beginning 03/16/2017  Cycle 5 single agent Revlimid beginning 04/14/2017(Revlimid placed on hold beginning 04/28/2017 due to thrombocytopenia)  Cycle 6 single agent Revlimid beginning 05/23/2017 x 14 days  Cycle 1 melphalan/prednisone 08/16/2018 5. COPD with acute and chronic respiratory failure/hypoxemia 6. History of coronary artery disease 7. Ischemic cardiomyopathy 8. ICD in place 9. Prostatic hypertrophy 10.Inappropriately low erythropoietin level 11/28/2016 11. Severe pancytopenia secondary to multiple myeloma and Revlimid-improved 12. Fever-likely related to an upper respiratory infection, RSV positive swab 02/02/2017 13.Anemia secondary to renal insufficiency-trial of weekly erythropoietin initiated 06/17/2017.Erythropoietin dose increased to 60,000 units weekly beginning 07/28/2017.  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  He continues to have neutropenia and thrombocytopenia likely secondary to melphalan.  He understands to contact the office with fever, chills, other signs of infection, bleeding.  He will return for a follow-up CBC 09/23/2018.  The exertional dyspnea may be related to COPD.  He will complete a prednisone taper.  We recommend he follow-up with Dr. Gwenette Greet.  He will return for lab and follow-up on 09/30/2018.  He will contact the office in the interim as outlined above or with any other problems.  Patient seen with Dr. [name].  25 minutes were spent face-to-face at today's visit with the majority of that time involved in counseling/coordination of care.    Ned Card ANP/GNP-BC   09/20/2018  3:54 PM This was a shared visit with Ned Card.  [name] has persistent thrombocytopenia and mild neutropenia, most likely toxicity related to melphalan.  Hopefully the neutrophils and platelets will  improve over  the next 1-2 weeks.  I suspect the dyspnea is unrelated to myeloma or hematologic indices.  Most likely has dyspnea secondary to COPD.  He will schedule appointment with Dr. Gwenette Greet.  He is taking multiple inhalers.  We prescribed a short course of prednisone.   I discussed the case with his daughter by telephone last week.  We will contact her again today. Julieanne Manson, MD

## 2018-09-20 NOTE — Telephone Encounter (Signed)
Spoke with dtr, [name] by phone.  She expressed concern over pt's continued weakness and SHOB and possible need for blood transfusion.  RN informed her that pt would have blood work checked today during his appt.  She asked that Dr [name] or Lattie Haw call her after the appt.  Msg given to Dr [name].

## 2018-09-21 ENCOUNTER — Telehealth: Payer: Self-pay | Admitting: Oncology

## 2018-09-21 NOTE — Telephone Encounter (Signed)
Called and spoke with patients daughter. Dates are different than requested dates of los per patients availability. Confirmed appt

## 2018-09-22 ENCOUNTER — Other Ambulatory Visit: Payer: Self-pay

## 2018-09-22 ENCOUNTER — Inpatient Hospital Stay: Payer: Medicare Other

## 2018-09-22 DIAGNOSIS — D631 Anemia in chronic kidney disease: Secondary | ICD-10-CM | POA: Diagnosis not present

## 2018-09-22 DIAGNOSIS — D563 Thalassemia minor: Secondary | ICD-10-CM | POA: Diagnosis not present

## 2018-09-22 DIAGNOSIS — D61818 Other pancytopenia: Secondary | ICD-10-CM | POA: Diagnosis not present

## 2018-09-22 DIAGNOSIS — N189 Chronic kidney disease, unspecified: Secondary | ICD-10-CM | POA: Diagnosis not present

## 2018-09-22 DIAGNOSIS — C9000 Multiple myeloma not having achieved remission: Secondary | ICD-10-CM | POA: Diagnosis not present

## 2018-09-22 DIAGNOSIS — J449 Chronic obstructive pulmonary disease, unspecified: Secondary | ICD-10-CM | POA: Diagnosis not present

## 2018-09-22 LAB — CBC WITH DIFFERENTIAL (CANCER CENTER ONLY)
Abs Immature Granulocytes: 0.02 10*3/uL (ref 0.00–0.07)
Basophils Absolute: 0 10*3/uL (ref 0.0–0.1)
Basophils Relative: 0 %
Eosinophils Absolute: 0 10*3/uL (ref 0.0–0.5)
Eosinophils Relative: 0 %
HCT: 27 % — ABNORMAL LOW (ref 39.0–52.0)
Hemoglobin: 8.6 g/dL — ABNORMAL LOW (ref 13.0–17.0)
Immature Granulocytes: 1 %
Lymphocytes Relative: 12 %
Lymphs Abs: 0.2 10*3/uL — ABNORMAL LOW (ref 0.7–4.0)
MCH: 26 pg (ref 26.0–34.0)
MCHC: 31.9 g/dL (ref 30.0–36.0)
MCV: 81.6 fL (ref 80.0–100.0)
Monocytes Absolute: 0 10*3/uL — ABNORMAL LOW (ref 0.1–1.0)
Monocytes Relative: 1 %
Neutro Abs: 1.2 10*3/uL — ABNORMAL LOW (ref 1.7–7.7)
Neutrophils Relative %: 86 %
Platelet Count: 24 10*3/uL — ABNORMAL LOW (ref 150–400)
RBC: 3.31 MIL/uL — ABNORMAL LOW (ref 4.22–5.81)
RDW: 17.9 % — ABNORMAL HIGH (ref 11.5–15.5)
WBC Count: 1.5 10*3/uL — ABNORMAL LOW (ref 4.0–10.5)
nRBC: 0 % (ref 0.0–0.2)

## 2018-09-22 LAB — SAMPLE TO BLOOD BANK

## 2018-09-23 ENCOUNTER — Telehealth: Payer: Self-pay | Admitting: Oncology

## 2018-09-23 ENCOUNTER — Encounter: Payer: Self-pay | Admitting: Adult Health

## 2018-09-23 ENCOUNTER — Telehealth: Payer: Self-pay | Admitting: Pulmonary Disease

## 2018-09-23 ENCOUNTER — Ambulatory Visit (INDEPENDENT_AMBULATORY_CARE_PROVIDER_SITE_OTHER): Payer: Medicare Other | Admitting: Adult Health

## 2018-09-23 ENCOUNTER — Other Ambulatory Visit: Payer: Self-pay | Admitting: Nurse Practitioner

## 2018-09-23 ENCOUNTER — Telehealth: Payer: Self-pay | Admitting: Nurse Practitioner

## 2018-09-23 ENCOUNTER — Other Ambulatory Visit: Payer: Medicare Other

## 2018-09-23 DIAGNOSIS — I5032 Chronic diastolic (congestive) heart failure: Secondary | ICD-10-CM

## 2018-09-23 DIAGNOSIS — J9611 Chronic respiratory failure with hypoxia: Secondary | ICD-10-CM | POA: Diagnosis not present

## 2018-09-23 DIAGNOSIS — D508 Other iron deficiency anemias: Secondary | ICD-10-CM

## 2018-09-23 DIAGNOSIS — J441 Chronic obstructive pulmonary disease with (acute) exacerbation: Secondary | ICD-10-CM | POA: Diagnosis not present

## 2018-09-23 DIAGNOSIS — I255 Ischemic cardiomyopathy: Secondary | ICD-10-CM | POA: Diagnosis not present

## 2018-09-23 DIAGNOSIS — C9000 Multiple myeloma not having achieved remission: Secondary | ICD-10-CM

## 2018-09-23 DIAGNOSIS — D696 Thrombocytopenia, unspecified: Secondary | ICD-10-CM

## 2018-09-23 NOTE — Assessment & Plan Note (Signed)
Suspect a component of mildly decompensated diastolic heart failure.  Unfortunately patient has to do self caths. We will increase Lasix x1 day.  Plan  Patient Instructions  Finish prednisone taper as directed.  Take Lasix 20mg  in am x 1 then daily As needed   Continue on Brovana and Pulmicort neb Twice daily .  Albuterol neb every 4hr as needed -this is your rescue .  Continue Oxygen Oxygen 2.5l/m  Follow up with Dr. Elsworth Soho or Parrett NP  In 4-6  Weeks and As needed   Please contact office for sooner follow up if symptoms do not improve or worsen or seek emergency care

## 2018-09-23 NOTE — Assessment & Plan Note (Signed)
O2 saturations are stable with no increased oxygen demands.  Plan  Patient Instructions  Finish prednisone taper as directed.  Take Lasix 20mg  in am x 1 then daily As needed   Continue on Brovana and Pulmicort neb Twice daily .  Albuterol neb every 4hr as needed -this is your rescue .  Continue Oxygen Oxygen 2.5l/m  Follow up with Dr. Elsworth Soho or Soren Pigman NP  In 4-6  Weeks and As needed   Please contact office for sooner follow up if symptoms do not improve or worsen or seek emergency care

## 2018-09-23 NOTE — Assessment & Plan Note (Addendum)
Acute COPD exacerbation.  Patient has no signs or symptoms of infection however is high risk due to his neutropenia Will hold on antibiotics at this time as recent chest x-ray showed no acute process. We will slowly taper prednisone as directed.  Continue on pulmonary hygiene regimen with Brovana and Pulmicort nebs.  May use albuterol nebulizers as needed. Gentle diuresis  as volume overload may be a contributing factor  Plan  Patient Instructions  Finish prednisone taper as directed.  Take Lasix 20mg  in am x 1 then daily As needed   Continue on Brovana and Pulmicort neb Twice daily .  Albuterol neb every 4hr as needed -this is your rescue .  Continue Oxygen Oxygen 2.5l/m  Follow up with Dr. Elsworth Soho or Parrett NP  In 4-6  Weeks and As needed   Please contact office for sooner follow up if symptoms do not improve or worsen or seek emergency care

## 2018-09-23 NOTE — Telephone Encounter (Signed)
Spoke with the pt's daughter  She states that the pt has been having increased SOB x 6 wks- esp worse over the past 2 wks  She thinks that this started after he began chemo txs  He gets winded walking room to room  Requesting appt  Covid screening questions asked and were all neg  Daughter aware of new office address

## 2018-09-23 NOTE — Patient Instructions (Signed)
Finish prednisone taper as directed.  Take Lasix 20mg  in am x 1 then daily As needed   Continue on Brovana and Pulmicort neb Twice daily .  Albuterol neb every 4hr as needed -this is your rescue .  Continue Oxygen Oxygen 2.5l/m  Follow up with Dr. Elsworth Soho or Fredy Gladu NP  In 4-6  Weeks and As needed   Please contact office for sooner follow up if symptoms do not improve or worsen or seek emergency care

## 2018-09-23 NOTE — Telephone Encounter (Signed)
Scheduled apt per 8/27 sch message - pt is aware of appt date and time

## 2018-09-23 NOTE — Telephone Encounter (Signed)
Spoke with daughter Anthoney Dicke regarding the blood work from yesterday.  Blood counts are holding fairly stable.  We will have him return for a follow-up CBC 09/27/2018, blood bank tube as well in case he needs transfusion support.  He continues to have dyspnea on exertion in the morning hours.  They are trying to get him an appointment with pulmonary.

## 2018-09-23 NOTE — Assessment & Plan Note (Signed)
Underlying multiple myeloma with chronic anemia/neutropenia/thrombocytopenia-suspect these are contributing factors to his progressive dyspnea. Continue follow-up with oncology.  Has had recent platelet and PRBC infusions.  Most recent labs showed stable hemoglobin.  White blood cell count and platelets remain low.  Plan  Patient Instructions  Finish prednisone taper as directed.  Take Lasix 35m in am x 1 then daily As needed   Continue on Brovana and Pulmicort neb Twice daily .  Albuterol neb every 4hr as needed -this is your rescue .  Continue Oxygen Oxygen 2.5l/m  Follow up with Dr. AElsworth Sohoor Daquane Aguilar NP  In 4-6  Weeks and As needed   Please contact office for sooner follow up if symptoms do not improve or worsen or seek emergency care

## 2018-09-23 NOTE — Progress Notes (Signed)
_0  ID: [name] Azure, MD, male    DOB: 03-14-1925, 85 y.o.   MRN: [id number]  Chief Complaint  Patient presents with  . 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  Patient has underlying moderate COPD with that is oxygen dependent. Patient is accompanied by his daughter.  Says over the last 4 to 6 weeks breathing seems to be getting worse.  Has been more short of breath over the last 1 to 2 weeks.  Was recently seen by oncology.  Started on a prednisone taper 2 days ago.  He is currently on 30 mg.Chest x-ray last week showed COPD changes without acute process. Patient has underlying multiple myeloma.  Recently began a cycle of melphalan and prednisone in July.  Required a blood and platelet transfusion x 2 in last couple of weeks.  Previously treated with Revlimid in 2018 and 19.  Says that he was doing okay until he received this melphalan symptoms seem to have began after starting this therapy.  Has been very weak since beginning therapy.  Denies any chest pain or hemoptysis or calf pain. He has chronic anemia lab work yesterday showed stable hemoglobin 8.6 white blood cell count 1.5.  And platelet count 24K On pulmicort and brovana neb Twice daily  .  Remains on Oxygen 2.5l/m  No increased congestion . Has daily cough , no worse than usual .  Had some increased lower extremity swelling and some orthopnea. 2D echo November 2019 showed a preserved EF and grade 1 diastolic dysfunction. Patient does live at home.  He has family support  and some home health care in his home  No Known Allergies  Immunization History  Administered Date(s) Administered  . Influenza Split 10/28/2010, 10/27/2012  . Influenza Whole 12/08/2011  . Influenza, High Dose Seasonal PF 10/16/2016  . Influenza,inj,Quad PF,6+ Mos 10/07/2017  . Pneumococcal Conjugate-13 05/26/2013  . Pneumococcal Polysaccharide-23 10/31/1998    Past Medical History:  Diagnosis Date  . Anemia, unspecified   . CAD (coronary artery disease)   . Cataracts, bilateral   . CHF (congestive heart failure) ([place])   . Chronic airway obstruction, not elsewhere classified    on o2 at night  . Diabetes mellitus   . Diverticulosis of colon (without mention of hemorrhage)   . Dyslipidemia   . Emphysema   . Hemorrhoids   . ICD (implantable cardiac defibrillator) in place    st jude  . Ischemic cardiomyopathy   . Macular degeneration   . MI, old 2010 or 2011  . Prostatic hypertrophy    hx of  . Retention of urine, unspecified   . Unspecified disorder resulting from impaired renal function     Tobacco History: Social History   Tobacco Use  Smoking Status Former Smoker  . Types: Cigarettes  . Quit date: 04/27/2009  . Years since quitting: 9.4  Smokeless Tobacco Never Used   Counseling given: Not Answered   Outpatient Medications Prior to Visit  Medication Sig Dispense Refill  . albuterol (PROAIR HFA) 108 (90 Base) MCG/ACT inhaler  Inhale 2 puffs into the lungs every 4 (four) hours as needed for wheezing or shortness of breath. 1 Inhaler 3  . albuterol (PROVENTIL) (2.5 MG/3ML) 0.083% nebulizer solution Take 3 mLs (2.5 mg total) every 2 (two) hours as needed by nebulization for wheezing or shortness of breath. 75 mL 0  . ALPRAZolam (XANAX) 0.25 MG tablet Take 1 tablet (0.25 mg total) by mouth at bedtime as needed for sleep. 30 tablet 0  . arformoterol (BROVANA) 15 MCG/2ML NEBU Take 15 mcg by nebulization 2 (two) times daily.    . budesonide (PULMICORT) 0.5 MG/2ML  nebulizer solution Take 2 mLs (0.5 mg total) by nebulization 2 (two) times daily. 120 mL 3  . ciprofloxacin (CIPRO) 500 MG tablet Take 1 tablet (500 mg total) by mouth 2 (two) times daily. 10 tablet 0  . ferrous sulfate 325 (65 FE) MG tablet Take 1 tablet (325 mg total) by mouth 2 (two) times daily with a meal. 60 tablet 0  . furosemide (LASIX) 20 MG tablet Take 1 tablet (20 mg total) by mouth daily as needed for edema. 90 tablet 3  . guaiFENesin (MUCINEX) 600 MG 12 hr tablet Take 600 mg by mouth 2 (two) times daily as needed for cough or to loosen phlegm.     . loratadine (CLARITIN) 10 MG tablet Take 1 tablet (10 mg total) by mouth daily. 30 tablet 1  . melphalan (ALKERAN) 2 MG tablet Take 4 tablets (62m) by mouth once daily on empty stomach 1 hr before or 2 hrs after doof. Take 4 days on, 24 d off, repeat every 28 d 16 tablet 0  . metoprolol tartrate (LOPRESSOR) 25 MG tablet Take 12.5 mg by mouth 2 (two) times daily. Reported on 06/06/2015    . nitroGLYCERIN (NITROSTAT) 0.4 MG SL tablet Place 1 tablet (0.4 mg total) under the tongue every 5 (five) minutes as needed for chest pain. UP TO 3 DOSES THEN CALL EMS 30 tablet 1  . ondansetron (ZOFRAN) 8 MG tablet Take 0.5-1 tablets (4-8 mg total) by mouth every 8 (eight) hours as needed for nausea or vomiting. Take 823m30-60 min prior to melphalan. 30 tablet 0  . OXYGEN Inhale 2 L into the lungs at bedtime.     . pantoprazole (PROTONIX) 40 MG tablet Take 1 tablet (40 mg total) by mouth daily. 30 tablet 0  . predniSONE (DELTASONE) 20 MG tablet Take 2 tablets (40 mg total) by mouth daily with breakfast. Take 4 days on, 24 days off, repeat every 28 days 8 tablet 0  . predniSONE (DELTASONE) 20 MG tablet Take 4033maily x 2, 48m71mily x 2, 20 mg daily x 2, 10 mg daily x 2 then stop 12 tablet 0  . rosuvastatin (CRESTOR) 5 MG tablet Take 1 tablet (5 mg total) by mouth daily. 90 tablet 3  . sodium chloride (OCEAN) 0.65 % SOLN nasal spray Place 2 sprays into both  nostrils 2 (two) times daily.  0  . trimethoprim (TRIMPEX) 100 MG tablet     . vitamin B-12 (CYANOCOBALAMIN) 1000 MCG tablet Take 1,000 mcg by mouth daily.     Facility-Administered Medications Prior to Visit  Medication Dose Route Frequency Provider Last Rate Last Dose  . 0.9 %  sodium chloride infusion  250 mL Intravenous Once SherLadell Pier         Review of Systems:   Constitutional:   No  weight loss, night sweats,  Fevers, chills,  +fatigue, or  lassitude.  HEENT:   No headaches,  Difficulty swallowing,  Tooth/dental problems, or  Sore throat,                No sneezing, itching, ear ache, nasal congestion, post nasal drip,   CV:  No chest pain,  Orthopnea, PND, +swelling in lower extremities,  No anasarca, dizziness, palpitations, syncope.   GI  No heartburn, indigestion, abdominal pain, nausea, vomiting, diarrhea, change in bowel habits, loss of appetite, bloody stools.   Resp:    No chest wall deformity  Skin: no rash or lesions.  GU: no dysuria, change in color of urine, no urgency or frequency.  No flank pain, no hematuria   MS:  No joint pain or swelling.  No decreased range of motion.  No back pain.    Physical Exam  BP 116/62 (BP Location: Left Arm, Cuff Size: Normal)   Pulse 69   Temp (!) 97.3 F (36.3 C) (Oral)   Ht 6' (1.829 m)   Wt 146 lb (66.2 kg)   SpO2 96%   BMI 19.80 kg/m   GEN: A/Ox3; pleasant , NAD, frail elderly in wheelchair on oxygen  HEENT:  [place]/AT,  , NOSE-clear, THROAT-clear, no lesions, no postnasal drip or exudate noted.   NECK:  Supple w/ fair ROM; no JVD; normal carotid impulses w/o bruits; no thyromegaly or nodules palpated; no lymphadenopathy.    RESP diminished breath sounds in the bases . no accessory muscle use, no dullness to percussion  CARD:  RRR, no m/r/g 1-2+ edema pulses intact, no cyanosis or clubbing.  GI:   Soft & nt; nml bowel sounds; no organomegaly or masses detected.   Musco: Warm bil, no deformities or  joint swelling noted.   Neuro: alert, no focal deficits noted.    Skin: Warm, no lesions or rashes    Lab Results:  CBC    Component Value Date/Time   WBC 1.5 (L) 09/22/2018 1504   WBC 4.2 07/28/2018 1509   RBC 3.31 (L) 09/22/2018 1504   HGB 8.6 (L) 09/22/2018 1504   HGB 7.4 (L) 01/12/2017 1538   HCT 27.0 (L) 09/22/2018 1504   HCT 23.8 (L) 01/12/2017 1538   PLT 24 (L) 09/22/2018 1504   PLT 89 (L) 01/12/2017 1538   MCV 81.6 09/22/2018 1504   MCV 74.3 (L) 01/12/2017 1538   MCH 26.0 09/22/2018 1504   MCHC 31.9 09/22/2018 1504   RDW 17.9 (H) 09/22/2018 1504   RDW 21.0 (H) 01/12/2017 1538   LYMPHSABS 0.2 (L) 09/22/2018 1504   LYMPHSABS 0.6 (L) 01/12/2017 1538   MONOABS 0.0 (L) 09/22/2018 1504   MONOABS 0.3 01/12/2017 1538   EOSABS 0.0 09/22/2018 1504   EOSABS 0.1 01/12/2017 1538   BASOSABS 0.0 09/22/2018 1504   BASOSABS 0.1 01/12/2017 1538    BMET    Component Value Date/Time   NA 142 09/20/2018 1506   NA 146 (H) 12/02/2017 1553   NA 140 01/12/2017 1538   K 3.9 09/20/2018 1506   K 4.4 01/12/2017 1538   CL 107 09/20/2018 1506   CO2 25 09/20/2018 1506   CO2 27 01/12/2017 1538   GLUCOSE 131 (H) 09/20/2018 1506   GLUCOSE 84 01/12/2017 1538   BUN 31 (H) 09/20/2018 1506   BUN 30 12/02/2017 1553   BUN 23.7 01/12/2017 1538   CREATININE 1.16 09/20/2018 1506   CREATININE 1.0 01/12/2017 1538   CALCIUM 8.5 (L) 09/20/2018 1506   CALCIUM 8.7 01/12/2017 1538   GFRNONAA 55 (L) 09/20/2018  [place] 09/20/2018 1506    BNP    Component Value Date/Time   BNP 124.2 (H) 11/27/2016 1700   BNP 45.9 06/06/2015 1629    ProBNP    Component Value Date/Time   PROBNP 1,328 (H) 12/02/2017 1553   PROBNP 759.3 (H) 11/11/2013 2139    Imaging: Dg Chest 2 View  Result Date: 09/15/2018 CLINICAL DATA:  Cough, shortness of breath EXAM: CHEST - 2 VIEW COMPARISON:  07/28/2018 FINDINGS: Stable positioning of left-sided implanted cardiac device. Heart size is normal. Aorta is  calcified and tortuous. Lungs are hyperexpanded with flattening of the diaphragms and coarsened interstitial markings compatible with chronic emphysema/COPD. No superimposed airspace consolidation. No pleural effusion or pneumothorax. IMPRESSION: 1. No acute cardiopulmonary findings. 2. Stigmata of emphysema. Electronically Signed   By: Davina Poke M.D.   On: 09/15/2018 12:44    epoetin alfa (EPOGEN) injection 60,000 Units    Date Action Dose Route User   Discharged on 07/28/2018   Admitted on 07/28/2018   07/26/2018 1552 Given 60000 Units Subcutaneous (Right Arm) Cates, Porsche L, LPN    epoetin alfa (EPOGEN) injection 60,000 Units    Date Action Dose Route User   08/09/2018 1636 Given 60000 Units Subcutaneous (Right Arm) Kasandra Knudsen A, LPN    epoetin alfa (EPOGEN) injection 60,000 Units    Date Action Dose Route User   08/23/2018 1541 Given 60000 Units Subcutaneous (Right Arm) Kasandra Knudsen A, LPN    epoetin alfa (EPOGEN) injection 60,000 Units    Date Action Dose Route User   09/06/2018 1600 Given 60000 Units Subcutaneous (Right Arm) Kelli Hope, LPN    epoetin alfa (EPOGEN) injection 60,000 Units    Date Action Dose Route User   09/15/2018 1404 Given 60000 Units Subcutaneous (Right Arm) Priscille Loveless, RN    0.9 %  sodium chloride infusion (Manually program via Guardrails IV Fluids)    Date Action Dose Route User   Discharged on 07/28/2018   Admitted on 07/28/2018   07/27/2018 1430 New Bag/Given 250 mL Intravenous Desota, Dava Najjar, RN    0.9 %  sodium chloride infusion (Manually program via Guardrails IV Fluids)    Date Action Dose Route User   08/30/2018 1259 New Bag/Given 250 mL Intravenous Arman Bogus, RN    0.9 %  sodium chloride infusion (Manually program via Guardrails IV Fluids)    Date Action Dose Route User   09/09/2018 1323 New Bag/Given 250 mL Intravenous Arman Bogus, RN    0.9 %  sodium chloride infusion (Manually program via Guardrails IV Fluids)     Date Action Dose Route User   09/15/2018 1348 New Bag/Given 250 mL Intravenous Dierdre Harness E, RN    0.9 %  sodium chloride infusion (Manually program via Guardrails IV Fluids)    Date Action Dose Route User   09/18/2018 1035 New Bag/Given 250 mL Intravenous Priscille Loveless, RN      No flowsheet data found.  No results found for: NITRICOXIDE      Assessment & Plan:   Chronic diastolic CHF (congestive heart failure) ([place]) Suspect a component of mildly decompensated diastolic heart failure.  Unfortunately patient has to do self caths. We will increase Lasix x1 day.  Plan  Patient Instructions  Finish prednisone taper as directed.  Take Lasix 13m in am x 1 then daily As needed   Continue on Brovana and Pulmicort neb Twice daily .  Albuterol neb every 4hr as needed -  this is your rescue .  Continue Oxygen Oxygen 2.5l/m  Follow up with Dr. Elsworth Soho or Parrett NP  In 4-6  Weeks and As needed   Please contact office for sooner follow up if symptoms do not improve or worsen or seek emergency care        COPD with exacerbation ([place]) Acute COPD exacerbation.  Patient has no signs or symptoms of infection however is high risk due to his neutropenia Will hold on antibiotics at this time as recent chest x-ray showed no acute process. We will slowly taper prednisone as directed.  Continue on pulmonary hygiene regimen with Brovana and Pulmicort nebs.  May use albuterol nebulizers as needed. Gentle diuresis  as volume overload may be a contributing factor  Plan  Patient Instructions  Finish prednisone taper as directed.  Take Lasix 29m in am x 1 then daily As needed   Continue on Brovana and Pulmicort neb Twice daily .  Albuterol neb every 4hr as needed -this is your rescue .  Continue Oxygen Oxygen 2.5l/m  Follow up with Dr. AElsworth Sohoor Parrett NP  In 4-6  Weeks and As needed   Please contact office for sooner follow up if symptoms do not improve or worsen or seek emergency care         Chronic respiratory failure with hypoxia ([facility]) O2 saturations are stable with no increased oxygen demands.  Plan  Patient Instructions  Finish prednisone taper as directed.  Take Lasix 259min am x 1 then daily As needed   Continue on Brovana and Pulmicort neb Twice daily .  Albuterol neb every 4hr as needed -this is your rescue .  Continue Oxygen Oxygen 2.5l/m  Follow up with Dr. AlElsworth Sohor Parrett NP  In 4-6  Weeks and As needed   Please contact office for sooner follow up if symptoms do not improve or worsen or seek emergency care        Anemia Underlying multiple myeloma with chronic anemia/neutropenia/thrombocytopenia-suspect these are contributing factors to his progressive dyspnea. Continue follow-up with oncology.  Has had recent platelet and PRBC infusions.  Most recent labs showed stable hemoglobin.  White blood cell count and platelets remain low.  Plan  Patient Instructions  Finish prednisone taper as directed.  Take Lasix 2044mn am x 1 then daily As needed   Continue on Brovana and Pulmicort neb Twice daily .  Albuterol neb every 4hr as needed -this is your rescue .  Continue Oxygen Oxygen 2.5l/m  Follow up with Dr. AlvElsworth Soho Parrett NP  In 4-6  Weeks and As needed   Please contact office for sooner follow up if symptoms do not improve or worsen or seek emergency care           TamRexene EdisonP 09/23/2018

## 2018-09-24 ENCOUNTER — Other Ambulatory Visit: Payer: Self-pay | Admitting: Lab

## 2018-09-27 ENCOUNTER — Telehealth: Payer: Self-pay

## 2018-09-27 ENCOUNTER — Other Ambulatory Visit: Payer: Self-pay

## 2018-09-27 ENCOUNTER — Ambulatory Visit (HOSPITAL_COMMUNITY)
Admission: RE | Admit: 2018-09-27 | Discharge: 2018-09-27 | Disposition: A | Discharge status: Home / Self Care | Payer: Medicare Other | Source: Ambulatory Visit | Attending: Adult Health | Admitting: Adult Health

## 2018-09-27 ENCOUNTER — Other Ambulatory Visit: Payer: Self-pay | Admitting: Adult Health

## 2018-09-27 ENCOUNTER — Inpatient Hospital Stay: Payer: Medicare Other

## 2018-09-27 ENCOUNTER — Telehealth: Payer: Self-pay | Admitting: Adult Health

## 2018-09-27 DIAGNOSIS — C9000 Multiple myeloma not having achieved remission: Secondary | ICD-10-CM

## 2018-09-27 DIAGNOSIS — R0602 Shortness of breath: Secondary | ICD-10-CM | POA: Diagnosis not present

## 2018-09-27 DIAGNOSIS — N189 Chronic kidney disease, unspecified: Secondary | ICD-10-CM | POA: Diagnosis not present

## 2018-09-27 DIAGNOSIS — D563 Thalassemia minor: Secondary | ICD-10-CM | POA: Diagnosis not present

## 2018-09-27 DIAGNOSIS — D631 Anemia in chronic kidney disease: Secondary | ICD-10-CM | POA: Diagnosis not present

## 2018-09-27 DIAGNOSIS — D61818 Other pancytopenia: Secondary | ICD-10-CM | POA: Diagnosis not present

## 2018-09-27 DIAGNOSIS — J449 Chronic obstructive pulmonary disease, unspecified: Secondary | ICD-10-CM | POA: Diagnosis not present

## 2018-09-27 DIAGNOSIS — D696 Thrombocytopenia, unspecified: Secondary | ICD-10-CM

## 2018-09-27 LAB — CBC WITH DIFFERENTIAL (CANCER CENTER ONLY)
Abs Immature Granulocytes: 0.03 10*3/uL (ref 0.00–0.07)
Basophils Absolute: 0 10*3/uL (ref 0.0–0.1)
Basophils Relative: 0 %
Eosinophils Absolute: 0 10*3/uL (ref 0.0–0.5)
Eosinophils Relative: 0 %
HCT: 22.9 % — ABNORMAL LOW (ref 39.0–52.0)
Hemoglobin: 7.2 g/dL — ABNORMAL LOW (ref 13.0–17.0)
Immature Granulocytes: 2 %
Lymphocytes Relative: 10 %
Lymphs Abs: 0.1 10*3/uL — ABNORMAL LOW (ref 0.7–4.0)
MCH: 25.4 pg — ABNORMAL LOW (ref 26.0–34.0)
MCHC: 31.4 g/dL (ref 30.0–36.0)
MCV: 80.9 fL (ref 80.0–100.0)
Monocytes Absolute: 0 10*3/uL — ABNORMAL LOW (ref 0.1–1.0)
Monocytes Relative: 2 %
Neutro Abs: 1.1 10*3/uL — ABNORMAL LOW (ref 1.7–7.7)
Neutrophils Relative %: 86 %
Platelet Count: 42 10*3/uL — ABNORMAL LOW (ref 150–400)
RBC: 2.83 MIL/uL — ABNORMAL LOW (ref 4.22–5.81)
RDW: 18.4 % — ABNORMAL HIGH (ref 11.5–15.5)
WBC Count: 1.2 10*3/uL — ABNORMAL LOW (ref 4.0–10.5)
nRBC: 4 % — ABNORMAL HIGH (ref 0.0–0.2)

## 2018-09-27 LAB — SAMPLE TO BLOOD BANK

## 2018-09-27 LAB — PREPARE RBC (CROSSMATCH)

## 2018-09-27 MED ORDER — IOHEXOL 350 MG/ML SOLN
100.0000 mL | Freq: Once | INTRAVENOUS | Status: AC | PRN
  Administered 2018-09-27: 100 mL via INTRAVENOUS

## 2018-09-27 MED ORDER — SODIUM CHLORIDE (PF) 0.9 % IJ SOLN
INTRAMUSCULAR | Status: AC
  Filled 2018-09-27: qty 50

## 2018-09-27 NOTE — Telephone Encounter (Signed)
Call returned to daughter [name] (dpr), she reports her father was here Thursday but he is having what she explains as increased SOB with panic attacks. She reports he usually takes alprazolam at night and she gave him another half this morning to help. They will be having 24 hour care starting today. She reports he has had budesonide and brovana nebulizer this AM. Denies chest pain. He reports tightness and pressure in his chest. He just completed a prednisone taper yesterday. She reports the prednisone did not seem to give him any relief. She reports two blood transfusions and platelet infusions over last two weeks. She reports this normally makes him feel better but so far it has not helped. She states he is hunched over and struggling to get his breath.   Last OV: 08/27 with TP  TP please advise. Thanks.

## 2018-09-27 NOTE — Telephone Encounter (Signed)
Spoke with daughter [name]) regarding pt's continued dyspnea. This RN informed her that I would let MD [name] know about the pt's continued dyspnea, and that the patient is getting a CT done today along with lab work per his pulmonary team. No other concerns at this time.   MD [name] updated with information listed above. No new orders at this time.

## 2018-09-27 NOTE — Telephone Encounter (Signed)
Spoke with patient's daughter regarding pt's lab results of his hemoglobin of 7.2 and platelets of 42. Pt's daughter is aware of his appointment for 2 units of blood tomorrow (9/1) at 11:00am. Pt's daughter verbalized understanding that he will need his blood bank bracelet. No other concerns at this time.

## 2018-09-27 NOTE — Telephone Encounter (Signed)
STAT CT Angio ordered CMET was done 8.24.2020 at oncology [facility] oncology lab, spoke with Anderson Malta about BNP being added on - requisition to be faxed to [phone].  Patient's daughter [name] is aware of the above and that she will be receiving call to schedule the CTA here shortly.  Nothing further needed at this time; will sign off.

## 2018-09-27 NOTE — Telephone Encounter (Signed)
Spoke with daughter , with ongoing symptoms unresponsive to therapy will need further evaluation . Rec: ER since chest pain , dyspnea.  No fever , + cough , mucus white .   Declines  At this time . Wants to try OP workup   Pt has multiple myeloma , severe anemia , leukopenia/thrombocytopenia . Recent tx -intolerant with [name]  Please set up for   CTa chest PE protocol -today . (recent bmet )   Add bnp to today's labs   If workup is neg and not improving will need office visit   Please contact office for sooner follow up if symptoms do not improve or worsen or seek emergency care

## 2018-09-28 ENCOUNTER — Ambulatory Visit (INDEPENDENT_AMBULATORY_CARE_PROVIDER_SITE_OTHER): Payer: Medicare Other

## 2018-09-28 ENCOUNTER — Telehealth: Payer: Self-pay | Admitting: Adult Health

## 2018-09-28 ENCOUNTER — Inpatient Hospital Stay: Payer: Medicare Other | Attending: Oncology

## 2018-09-28 ENCOUNTER — Inpatient Hospital Stay (HOSPITAL_BASED_OUTPATIENT_CLINIC_OR_DEPARTMENT_OTHER): Payer: Medicare Other | Admitting: Medical

## 2018-09-28 DIAGNOSIS — D631 Anemia in chronic kidney disease: Secondary | ICD-10-CM | POA: Diagnosis not present

## 2018-09-28 DIAGNOSIS — J449 Chronic obstructive pulmonary disease, unspecified: Secondary | ICD-10-CM

## 2018-09-28 DIAGNOSIS — D508 Other iron deficiency anemias: Secondary | ICD-10-CM

## 2018-09-28 DIAGNOSIS — J439 Emphysema, unspecified: Secondary | ICD-10-CM | POA: Diagnosis not present

## 2018-09-28 DIAGNOSIS — J9611 Chronic respiratory failure with hypoxia: Secondary | ICD-10-CM

## 2018-09-28 DIAGNOSIS — I255 Ischemic cardiomyopathy: Secondary | ICD-10-CM

## 2018-09-28 DIAGNOSIS — R0600 Dyspnea, unspecified: Secondary | ICD-10-CM

## 2018-09-28 DIAGNOSIS — D563 Thalassemia minor: Secondary | ICD-10-CM | POA: Diagnosis not present

## 2018-09-28 DIAGNOSIS — Z95810 Presence of automatic (implantable) cardiac defibrillator: Secondary | ICD-10-CM

## 2018-09-28 DIAGNOSIS — I5032 Chronic diastolic (congestive) heart failure: Secondary | ICD-10-CM

## 2018-09-28 DIAGNOSIS — J9621 Acute and chronic respiratory failure with hypoxia: Secondary | ICD-10-CM | POA: Insufficient documentation

## 2018-09-28 DIAGNOSIS — C9000 Multiple myeloma not having achieved remission: Secondary | ICD-10-CM | POA: Insufficient documentation

## 2018-09-28 DIAGNOSIS — D61818 Other pancytopenia: Secondary | ICD-10-CM | POA: Insufficient documentation

## 2018-09-28 DIAGNOSIS — N400 Enlarged prostate without lower urinary tract symptoms: Secondary | ICD-10-CM | POA: Diagnosis not present

## 2018-09-28 DIAGNOSIS — R0609 Other forms of dyspnea: Secondary | ICD-10-CM

## 2018-09-28 DIAGNOSIS — N189 Chronic kidney disease, unspecified: Secondary | ICD-10-CM | POA: Diagnosis not present

## 2018-09-28 DIAGNOSIS — I2510 Atherosclerotic heart disease of native coronary artery without angina pectoris: Secondary | ICD-10-CM | POA: Insufficient documentation

## 2018-09-28 MED ORDER — PREDNISONE 20 MG PO TABS
20.0000 mg | ORAL_TABLET | Freq: Once | ORAL | Status: AC
  Administered 2018-09-28: 13:00:00 20 mg via ORAL
  Filled 2018-09-28: qty 1

## 2018-09-28 MED ORDER — ALBUTEROL SULFATE HFA 108 (90 BASE) MCG/ACT IN AERS
INHALATION_SPRAY | RESPIRATORY_TRACT | Status: AC
  Filled 2018-09-28: qty 6.7

## 2018-09-28 MED ORDER — SODIUM CHLORIDE 0.9% IV SOLUTION
250.0000 mL | Freq: Once | INTRAVENOUS | Status: AC
  Administered 2018-09-28: 250 mL via INTRAVENOUS
  Filled 2018-09-28: qty 250

## 2018-09-28 MED ORDER — METHYLPREDNISOLONE 4 MG PO TBPK: ORAL_TABLET | ORAL | 0 refills | Status: DC

## 2018-09-28 MED ORDER — ALBUTEROL SULFATE HFA 108 (90 BASE) MCG/ACT IN AERS
2.0000 | INHALATION_SPRAY | Freq: Once | RESPIRATORY_TRACT | Status: AC
  Administered 2018-09-28: 13:00:00 2 via RESPIRATORY_TRACT

## 2018-09-28 NOTE — Telephone Encounter (Signed)
Called and spoke with [employer] rep; however, during the call I noticed that the pt's recent O2 order needs to be updated based on frequency. Per [place], I have placed a new order to [employer] with this updated information. Nothing further needed at this time.

## 2018-09-28 NOTE — Addendum Note (Signed)
Addended by: Annie Paras D on: 09/28/2018 04:46 PM   Modules accepted: Orders

## 2018-09-28 NOTE — Telephone Encounter (Signed)
Order placed. Per note, TP already spoke with pt's daugher. Nothing further needed at this time.

## 2018-09-28 NOTE — Telephone Encounter (Signed)
Please send order for portable tanks for him  Wanted to see how he was doing today  Looks like he went for blood transfusion  Will call daughter

## 2018-09-28 NOTE — Patient Instructions (Signed)
COVID-19: How to Protect Yourself and Others Know how it spreads  There is currently no vaccine to prevent coronavirus disease 2019 (COVID-19).  The best way to prevent illness is to avoid being exposed to this virus.  The virus is thought to spread mainly from person-to-person. ? Between people who are in close contact with one another (within about 6 feet). ? Through respiratory droplets produced when an infected person coughs, sneezes or talks. ? These droplets can land in the mouths or noses of people who are nearby or possibly be inhaled into the lungs. ? Some recent studies have suggested that COVID-19 may be spread by people who are not showing symptoms. Everyone should Clean your hands often  Wash your hands often with soap and water for at least 20 seconds especially after you have been in a public place, or after blowing your nose, coughing, or sneezing.  If soap and water are not readily available, use a hand sanitizer that contains at least 60% alcohol. Cover all surfaces of your hands and rub them together until they feel dry.  Avoid touching your eyes, nose, and mouth with unwashed hands. Avoid close contact  Stay home if you are sick.  Avoid close contact with people who are sick.  Put distance between yourself and other people. ? Remember that some people without symptoms may be able to spread virus. ? This is especially important for people who are at higher risk of getting very sick.www.cdc.gov/coronavirus/2019-ncov/need-extra-precautions/people-at-higher-risk.html Cover your mouth and nose with a cloth face cover when around others  You could spread COVID-19 to others even if you do not feel sick.  Everyone should wear a cloth face cover when they have to go out in public, for example to the grocery store or to pick up other necessities. ? Cloth face coverings should not be placed on young children under age 2, anyone who has trouble breathing, or is unconscious,  incapacitated or otherwise unable to remove the mask without assistance.  The cloth face cover is meant to protect other people in case you are infected.  Do NOT use a facemask meant for a healthcare worker.  Continue to keep about 6 feet between yourself and others. The cloth face cover is not a substitute for social distancing. Cover coughs and sneezes  If you are in a private setting and do not have on your cloth face covering, remember to always cover your mouth and nose with a tissue when you cough or sneeze or use the inside of your elbow.  Throw used tissues in the trash.  Immediately wash your hands with soap and water for at least 20 seconds. If soap and water are not readily available, clean your hands with a hand sanitizer that contains at least 60% alcohol. Clean and disinfect  Clean AND disinfect frequently touched surfaces daily. This includes tables, doorknobs, light switches, countertops, handles, desks, phones, keyboards, toilets, faucets, and sinks. www.cdc.gov/coronavirus/2019-ncov/prevent-getting-sick/disinfecting-your-home.html  If surfaces are dirty, clean them: Use detergent or soap and water prior to disinfection.  Then, use a household disinfectant. You can see a list of EPA-registered household disinfectants here. cdc.gov/coronavirus 06/01/2018 This information is not intended to replace advice given to you by your health care provider. Make sure you discuss any questions you have with your health care provider. Document Released: 05/11/2018 Document Revised: 06/09/2018 Document Reviewed: 05/11/2018 Elsevier Patient Education  2020 Elsevier Inc.  

## 2018-09-28 NOTE — Telephone Encounter (Signed)
Please send order for O2 tanks Currently at oncology very weak, to have transfusion

## 2018-09-28 NOTE — Telephone Encounter (Signed)
Primary Pulmonologist: Dr. Ashok Cordia Last office visit and with whom: 09/23/2018 with TP What do we see them for (pulmonary problems): Shortness of breath, Chronic Diastolic CHF, COPD, Chronic respiratory failure with hypoxia  Reason for call: Called and spoke with pt daughter, [name] (on [place]). [name] states she spoke with TP about pt's O2 yesterday 09/27/2018. She reports that portable, pulsed O2 is no longer sufficient for pt and that he needs to switch from POC to continuous O2. [name] states he will need the "tall skinny tanks" on a "rolly cart", a tank to go from the wheelchair to the bathroom, and a large tank in case the power goes out.   Routing this to TP for follow up. TP, please advise if you would be okay with placing an order for pt to receive continuous oxygen tanks rather than a portable tank.

## 2018-09-28 NOTE — Progress Notes (Signed)
Pt seen by PA Lucianne Lei only in [facility], no RN assessment at this time.  PA aware.  Pt transferred with belongings and oxygen tank to Infusion suite section B for blood transfusion.

## 2018-09-28 NOTE — Patient Instructions (Signed)
Albuterol inhalation aerosol What is this medicine? ALBUTEROL (al [name] [name]) is a bronchodilator. It helps open up the airways in your lungs to make it easier to breathe. This medicine is used to treat and to prevent bronchospasm. This medicine may be used for other purposes; ask your health care provider or pharmacist if you have questions. COMMON BRAND NAME(S): Proair HFA, Proventil, Proventil HFA, Respirol, Ventolin, Ventolin HFA What should I tell my health care provider before I take this medicine? They need to know if you have any of the following conditions: diabetes heart disease or irregular heartbeat high blood pressure pheochromocytoma seizures thyroid disease an unusual or allergic reaction to albuterol, levalbuterol, other medicines, foods, dyes, or preservatives pregnant or trying to get pregnant breast-feeding How should I use this medicine? This medicine is for inhalation through the mouth. Follow the directions on your prescription label. Take your medicine at regular intervals. Do not use more often than directed. Make sure that you are using your inhaler correctly. Ask your doctor or health care provider if you have any questions. Talk to your pediatrician regarding the use of this medicine in children. While this drug may be prescribed for children as young as 4 years for selected conditions, precautions do apply. Overdosage: If you think you have taken too much of this medicine contact a poison control center or emergency room at once. NOTE: This medicine is only for you. Do not share this medicine with others. What if I miss a dose? If you miss a dose, use it as soon as you can. If it is almost time for your next dose, use only that dose. Do not use double or extra doses. What may interact with this medicine? anti-infectives like chloroquine and pentamidine caffeine cisapride diuretics medicines for colds medicines for depression or for emotional or psychotic  conditions medicines for weight loss including some herbal products methadone some antibiotics like clarithromycin, erythromycin, levofloxacin, and linezolid some heart medicines steroid hormones like dexamethasone, cortisone, hydrocortisone theophylline thyroid hormones This list may not describe all possible interactions. Give your health care provider a list of all the medicines, herbs, non-prescription drugs, or dietary supplements you use. Also tell them if you smoke, drink alcohol, or use illegal drugs. Some items may interact with your medicine. What should I watch for while using this medicine? Tell your doctor or health care professional if your symptoms do not improve. Do not use extra albuterol. If your asthma or bronchitis gets worse while you are using this medicine, call your doctor right away. If your mouth gets dry try chewing sugarless gum or sucking hard candy. Drink water as directed. What side effects may I notice from receiving this medicine? Side effects that you should report to your doctor or health care professional as soon as possible: allergic reactions like skin rash, itching or hives, swelling of the face, lips, or tongue breathing problems chest pain feeling faint or lightheaded, falls high blood pressure irregular heartbeat fever muscle cramps or weakness pain, tingling, numbness in the hands or feet vomiting Side effects that usually do not require medical attention (report to your doctor or health care professional if they continue or are bothersome): changes in taste cough dry mouth headache nervousness or trembling stomach upset stuffy or runny nose throat irritation trouble sleeping This list may not describe all possible side effects. Call your doctor for medical advice about side effects. You may report side effects to FDA at 1-800-FDA-1088. Where should I keep my medicine?  Keep out of the reach of children. Store Proventil HFA and ProAir HFA  at room temperature between 15 and 25 degrees C (59 and 77 degrees F). Store Ventolin HFA at room temperature between 20 and 25 degrees C (68 and 77 degrees F); it may be stored between 15 and 30 degrees C (59 and 86 degrees F) on occasion. The contents are under pressure and may burst when exposed to heat or flame. Do not freeze. This medicine does not work as well if it is too cold. Throw away the inhaler when the dose counter displays "0" or after the expiration date on the package, whichever comes first. Ventolin HFA should be thrown away 12 months after removing it from the foil pouch. NOTE: This sheet is a summary. It may not cover all possible information. If you have questions about this medicine, talk to your doctor, pharmacist, or health care provider.  2020 Elsevier/Gold Standard (2018-04-29 12:46:54) Blood Transfusion, Adult, Care After This sheet gives you information about how to care for yourself after your procedure. Your doctor may also give you more specific instructions. If you have problems or questions, contact your doctor. Follow these instructions at home:   Take over-the-counter and prescription medicines only as told by your doctor.  Go back to your normal activities as told by your doctor.  Follow instructions from your doctor about how to take care of the area where an IV tube was put into your vein (insertion site). Make sure you: ? Wash your hands with soap and water before you change your bandage (dressing). If there is no soap and water, use hand sanitizer. ? Change your bandage as told by your doctor.  Check your IV insertion site every day for signs of infection. Check for: ? More redness, swelling, or pain. ? More fluid or blood. ? Warmth. ? Pus or a bad smell. Contact a doctor if:  You have more redness, swelling, or pain around the IV insertion site.  You have more fluid or blood coming from the IV insertion site.  Your IV insertion site feels warm to  the touch.  You have pus or a bad smell coming from the IV insertion site.  Your pee (urine) turns pink, red, or brown.  You feel weak after doing your normal activities. Get help right away if:  You have signs of a serious allergic or body defense (immune) system reaction, including: ? Itchiness. ? Hives. ? Trouble breathing. ? Anxiety. ? Pain in your chest or lower back. ? Fever, flushing, and chills. ? Fast pulse. ? Rash. ? Watery poop (diarrhea). ? Throwing up (vomiting). ? Dark pee. ? Serious headache. ? Dizziness. ? Stiff neck. ? Yellow color in your face or the white parts of your eyes (jaundice). Summary  After a blood transfusion, return to your normal activities as told by your doctor.  Every day, check for signs of infection where the IV tube was put into your vein.  Some signs of infection are warm skin, more redness and pain, more fluid or blood, and pus or a bad smell where the needle went in.  Contact your doctor if you feel weak or have any unusual symptoms. This information is not intended to replace advice given to you by your health care provider. Make sure you discuss any questions you have with your health care provider. Document Released: 02/03/2014 Document Revised: 05/20/2017 Document Reviewed: 09/07/2015 Elsevier Patient Education  2020 [employer].

## 2018-09-29 LAB — TYPE AND SCREEN
ABO/RH(D): A POS
Antibody Screen: NEGATIVE
Unit division: 0
Unit division: 0

## 2018-09-29 LAB — BPAM RBC
Blood Product Expiration Date: 202009232359
Blood Product Expiration Date: 202009242359
ISSUE DATE / TIME: 202009011235
ISSUE DATE / TIME: 202009011235
Unit Type and Rh: 6200
Unit Type and Rh: 6200

## 2018-09-29 LAB — SPECIMEN STATUS REPORT

## 2018-09-29 LAB — BRAIN NATRIURETIC PEPTIDE: BNP: 1073.2 pg/mL — ABNORMAL HIGH (ref 0.0–100.0)

## 2018-09-29 NOTE — Progress Notes (Signed)
[facility] Progress Note   [name] [name], [name] [name] [id number] [birth year] 85 y.o.  [name] [name], [name] [name] is managed by Dr. Dominica Severin B. Sherrill  Actively treated with chemotherapy/immunotherapy/hormonal therapy: yes  Current therapy: [name] and prednisone  Next scheduled appointment with provider: 10/01/2018  Assessment: Plan:    Dyspnea on exertion - Plan: albuterol (VENTOLIN HFA) 108 (90 Base) MCG/ACT inhaler 2 puff, predniSONE (DELTASONE) tablet 20 mg  Chronic diastolic CHF (congestive heart failure) ([place])  Pulmonary emphysema, unspecified emphysema type (HCC)  Iron deficiency anemia secondary to inadequate dietary iron intake  Multiple myeloma not having achieved remission (HCC)   Dyspnea on exertion in a setting congestive heart failure, pulmonary emphysema, and anemia: [name] was seen after presenting to the front of our office today for his transfusion of 2 units of packed red blood cells.  He reported that he was significantly short of breath.  He was seen with Dr. [name].  He was given oxygen at 3 L/min via nasal cannula.  He was also given prednisone 20 mg p.o. x1 and given albuterol inhaler treatment.  He was given a Medrol Dosepak which she will start tomorrow.  He was seen by pulmonology yesterday and was referred for a CT scan of the chest which returned showing emphysematous changes.  No acute findings were noted.  He was encouraged to use 1/2 tablet of alprazolam when he is having episodes of dyspnea on exertion.   Anemia in the setting of multiple myeloma: A CBC returned yesterday with a hemoglobin of 7.2.  The patient will be given 2 units of packed red blood cells today.  He is scheduled for follow-up on 10/01/2018.  Please see After Visit Summary for patient specific instructions.  Future Appointments  Date Time Provider [place]  10/01/2018  2:15 PM [facility] LAB 3 [facility] None  10/01/2018  2:45 PM Owens Shark, NP [facility] None   10/01/2018  3:30 PM [facility] FLUSH [facility] None  10/15/2018  3:30 PM [facility] FLUSH [facility] None  10/29/2018  3:30 PM [facility] [place] FLUSH [facility] None  11/12/2018  3:30 PM [facility] [place] FLUSH [facility] None  11/23/2018  7:35 AM [facility] DEVICE REMOTES [facility]  02/22/2019  7:35 AM [facility] DEVICE REMOTES [facility]  05/24/2019  7:35 AM [facility] DEVICE REMOTES [facility]  08/23/2019  7:35 AM [facility] DEVICE REMOTES [facility]  11/22/2019  7:35 AM [facility] DEVICE REMOTES [facility]    No orders of the defined types were placed in this encounter.      Subjective:   Patient ID:  [name] [name], [name] [name] is a 85 y.o. (DOB April 11, 1925) male.  Chief Complaint:  Chief Complaint  Patient presents with   Shortness of Breath    Chronic    HPI [name] [name], [name] [name] is a 85 year old male with with a history of multiple myeloma who is managed by Dr. Dominica Severin B. Sherrill and is treated with melphalan and prednisone.  He also has a history of anemia secondary to his treatment.  He presents today for a scheduled transfusion of 2 units of packed red blood cells given a hemoglobin of 7.2 yesterday.  The patient presented to the front office for check-in today when he reported that he had had progressive shortness of breath over the past several days.  He is using home oxygen.  He has inhalers at home which he states have not been beneficial.  He was seen by pulmonology yesterday with a CT of the chest completed.  CT  of the chest showed emphysematous changes but no other acute findings.  The patient had a BNP ordered which has not yet been collected.  He has a history of COPD and congestive heart failure in the setting of multiple myeloma and anemia.  He stated to Dr. [name] today that he would like to consider stopping all of his treatments if he is not feeling any better after receiving his transfusion of packed red  blood cells today.  He does not want this mentioned to his daughters at this time.  Medications: I have reviewed the patient's current medications.  Allergies: No Known Allergies  Past Medical History:  Diagnosis Date   Anemia, unspecified    CAD (coronary artery disease)    Cataracts, bilateral    CHF (congestive heart failure) (HCC)    Chronic airway obstruction, not elsewhere classified    on o2 at night   Diabetes mellitus    Diverticulosis of colon (without mention of hemorrhage)    Dyslipidemia    Emphysema    Hemorrhoids    ICD (implantable cardiac defibrillator) in place    st jude   Ischemic cardiomyopathy    Macular degeneration    MI, old 2010 or 2011   Prostatic hypertrophy    hx of   Retention of urine, unspecified    Unspecified disorder resulting from impaired renal function     Past Surgical History:  Procedure Laterality Date   CARDIAC DEFIBRILLATOR PLACEMENT  2011   st jude   CATARACT EXTRACTION     PACEMAKER INSERTION  2011   [facility]     4/11   TONSILLECTOMY  85 yo    Family History  Problem Relation Age of Onset   Coronary artery disease Mother    Stroke Mother    Congestive Heart Failure Mother    Lung cancer Father        smoker   Breast cancer Sister    Atrial fibrillation Daughter    Heart attack Brother     Social History   Socioeconomic History   Marital status: Widowed    Spouse name: Not on file   Number of children: Not on file   Years of education: Not on file   Highest education level: Not on file  Occupational History   Occupation: dentist    Employer: [employer] resource strain: Not on file   Food insecurity    Worry: Not on file    Inability: Not on file   Transportation needs    Medical: Not on file    Non-medical: Not on file  Tobacco Use   Smoking status: Former Smoker    Types: Cigarettes    Quit date: 04/27/2009    Years since quitting:  9.4   Smokeless tobacco: Never Used  Substance and Sexual Activity   Alcohol use: Yes    Comment: occasional beer   Drug use: No   Sexual activity: Never  Lifestyle   Physical activity    Days per week: Not on file    Minutes per session: Not on file   Stress: Not on file  Relationships   Social connections    Talks on phone: Not on file    Gets together: Not on file    Attends religious service: Not on file    Active member of club or organization: Not on file    Attends meetings of clubs or organizations: Not on file  Relationship status: Not on file   Intimate partner violence    Fear of current or ex partner: Not on file    Emotionally abused: Not on file    Physically abused: Not on file    Forced sexual activity: Not on file  Other Topics Concern   Not on file  Social History Narrative   Lives in a house with stairs, which he needs to use, with his wife.  Wife is not able-bodied.  Does not use a cane or walker, but is unsteady on his feet.        [name]:  [phone] cell, [phone], daughter, POA.     Boyd Kerbs:  [phone], nephew   Juluis Pitch:  [phone], daughter          Past Medical History, Surgical history, Social history, and Family history were reviewed and updated as appropriate.   Please see review of systems for further details on the patient's review from today.   Review of Systems:  Review of Systems  Constitutional: Positive for fatigue. Negative for chills, diaphoresis and fever.  HENT: Negative for congestion, postnasal drip, rhinorrhea and sore throat.   Respiratory: Positive for shortness of breath. Negative for cough and wheezing.   Cardiovascular: Negative for palpitations.  Musculoskeletal: Positive for gait problem.  Neurological: Positive for weakness. Negative for headaches.    Objective:   Physical Exam:  There were no vitals taken for this visit. ECOG: 2  Physical Exam Constitutional:       Comments: The patient is an elderly male who appears to be short of breath and is receiving oxygen via nasal cannula.  HENT:     Head: Normocephalic and atraumatic.     Right Ear: External ear normal.     Left Ear: External ear normal.     Mouth/Throat:     Pharynx: No oropharyngeal exudate.  Neck:     Musculoskeletal: Normal range of motion and neck supple.  Cardiovascular:     Rate and Rhythm: Normal rate and regular rhythm.     Heart sounds: Normal heart sounds. No murmur. No friction rub. No gallop.   Pulmonary:     Effort: Tachypnea present. No respiratory distress.     Breath sounds: Examination of the right-upper field reveals decreased breath sounds. Examination of the left-upper field reveals decreased breath sounds. Examination of the right-middle field reveals decreased breath sounds. Examination of the left-middle field reveals decreased breath sounds. Examination of the right-lower field reveals decreased breath sounds and rhonchi. Examination of the left-lower field reveals decreased breath sounds, rhonchi and rales. Decreased breath sounds, rhonchi and rales present. No wheezing.  Lymphadenopathy:     Cervical: No cervical adenopathy.  Skin:    General: Skin is warm and dry.     Findings: No erythema or rash.  Neurological:     Coordination: Coordination normal.  Psychiatric:        Behavior: Behavior normal.        Thought Content: Thought content normal.        Judgment: Judgment normal.     Lab Review:     Component Value Date/Time   NA 142 09/20/2018 1506   NA 146 (H) 12/02/2017 1553   NA 140 01/12/2017 1538   K 3.9 09/20/2018 1506   K 4.4 01/12/2017 1538   CL 107 09/20/2018 1506   CO2 25 09/20/2018 1506   CO2 27 01/12/2017 1538   GLUCOSE 131 (H) 09/20/2018 1506   GLUCOSE 84 01/12/2017  1538   BUN 31 (H) 09/20/2018 1506   BUN 30 12/02/2017 1553   BUN 23.7 01/12/2017 1538   CREATININE 1.16 09/20/2018 1506   CREATININE 1.0 01/12/2017 1538   CALCIUM 8.5  (L) 09/20/2018 1506   CALCIUM 8.7 01/12/2017 1538   PROT 6.7 09/20/2018 1506   PROT 5.7 (L) 01/12/2017 1538   PROT 6.1 (L) 01/12/2017 1538   ALBUMIN 3.3 (L) 09/20/2018 1506   ALBUMIN 2.9 (L) 01/12/2017 1538   AST 12 (L) 09/20/2018 1506   AST 11 01/12/2017 1538   ALT 26 09/20/2018 1506   ALT 12 01/12/2017 1538   ALKPHOS 77 09/20/2018 1506   ALKPHOS 49 01/12/2017 1538   BILITOT 0.5 09/20/2018 1506   BILITOT 0.47 01/12/2017 1538   GFRNONAA 55 (L) 09/20/2018 1506   GFRAA >60 09/20/2018 1506       Component Value Date/Time   WBC 1.2 (L) 09/27/2018 1552   WBC 4.2 07/28/2018 1509   RBC 2.83 (L) 09/27/2018 1552   HGB 7.2 (L) 09/27/2018 1552   HGB 7.4 (L) 01/12/2017 1538   HCT 22.9 (L) 09/27/2018 1552   HCT 23.8 (L) 01/12/2017 1538   PLT 42 (L) 09/27/2018 1552   PLT 89 (L) 01/12/2017 1538   MCV 80.9 09/27/2018 1552   MCV 74.3 (L) 01/12/2017 1538   MCH 25.4 (L) 09/27/2018 1552   MCHC 31.4 09/27/2018 1552   RDW 18.4 (H) 09/27/2018 1552   RDW 21.0 (H) 01/12/2017 1538   LYMPHSABS 0.1 (L) 09/27/2018 1552   LYMPHSABS 0.6 (L) 01/12/2017 1538   MONOABS 0.0 (L) 09/27/2018 1552   MONOABS 0.3 01/12/2017 1538   EOSABS 0.0 09/27/2018 1552   EOSABS 0.1 01/12/2017 1538   BASOSABS 0.0 09/27/2018 1552   BASOSABS 0.1 01/12/2017 1538   -------------------------------  Imaging from last 24 hours (if applicable):  Radiology interpretation: Dg Chest 2 View  Result Date: 09/15/2018 CLINICAL DATA:  Cough, shortness of breath EXAM: CHEST - 2 VIEW COMPARISON:  07/28/2018 FINDINGS: Stable positioning of left-sided implanted cardiac device. Heart size is normal. Aorta is calcified and tortuous. Lungs are hyperexpanded with flattening of the diaphragms and coarsened interstitial markings compatible with chronic emphysema/COPD. No superimposed airspace consolidation. No pleural effusion or pneumothorax. IMPRESSION: 1. No acute cardiopulmonary findings. 2. Stigmata of emphysema. Electronically Signed    By: Davina Poke M.D.   On: 09/15/2018 12:44   Ct Angio Chest W/cm &/or Wo Cm  Result Date: 09/27/2018 CLINICAL DATA:  Shortness of breath and chest pain EXAM: CT ANGIOGRAPHY CHEST WITH CONTRAST TECHNIQUE: Multidetector CT imaging of the chest was performed using the standard protocol during bolus administration of intravenous contrast. Multiplanar CT image reconstructions and MIPs were obtained to evaluate the vascular anatomy. CONTRAST:  188m OMNIPAQUE IOHEXOL 350 MG/ML SOLN COMPARISON:  09/15/2018 plain film FINDINGS: Cardiovascular: Atherosclerotic calcifications of the thoracic aorta and its branches are seen. No aneurysmal dilatation is noted. Opacification is suboptimal to evaluate for dissection. Coronary calcifications are seen. The pulmonary artery shows a normal branching pattern. No findings to suggest pulmonary emboli are identified. Mediastinum/Nodes: Thoracic inlet is within normal limits. No sizable hilar or mediastinal adenopathy is noted. The esophagus is within normal limits. Lungs/Pleura: Small bilateral pleural effusions are noted. Emphysematous changes are seen. Mild atelectatic changes are noted in the lingula. Mild scarring is noted in the right middle lobe inferiorly. No focal parenchymal nodule is seen. Mild dependent atelectatic changes are seen. Upper Abdomen: Visualized upper abdomen shows cystic changes within the kidneys but  incompletely evaluated. Musculoskeletal: Degenerative changes of the thoracic spine are noted. No acute rib abnormality is noted. No compression deformities are seen. Review of the MIP images confirms the above findings. IMPRESSION: No evidence of pulmonary emboli. Small bilateral pleural effusions. Emphysematous changes with mild dependent basilar atelectasis. Density is noted in the lingula posteriorly likely related atelectasis. Aortic Atherosclerosis (ICD10-I70.0) and Emphysema (ICD10-J43.9). Electronically Signed   By: Inez Catalina M.D.   On:  09/27/2018 15:58        This patient was seen with Dr. [name] with my treatment plan reviewed with him. He expressed agreement with my medical management of this patient. This was a shared visit with Sandi Mealy.  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  He has increased dyspnea.  He will receive packed red blood cells today, but I suspect the dyspnea is mostly related to advanced COPD.  [name] continue bronchodilator therapy and complete another steroid Dosepak.  He indicated he may wish to switch to a comfort approach if the red cell transfusion does not help his dyspnea.  He will return for an office visit on 10/01/2018.  Julieanne Manson, [name] [name]

## 2018-09-30 ENCOUNTER — Telehealth: Payer: Self-pay | Admitting: Adult Health

## 2018-09-30 ENCOUNTER — Ambulatory Visit: Payer: Medicare Other | Admitting: Nurse Practitioner

## 2018-09-30 ENCOUNTER — Other Ambulatory Visit: Payer: Medicare Other

## 2018-09-30 ENCOUNTER — Ambulatory Visit: Payer: Medicare Other

## 2018-09-30 ENCOUNTER — Telehealth: Payer: Self-pay | Admitting: Nurse Practitioner

## 2018-09-30 ENCOUNTER — Other Ambulatory Visit: Payer: Self-pay | Admitting: *Deleted

## 2018-09-30 DIAGNOSIS — D508 Other iron deficiency anemias: Secondary | ICD-10-CM

## 2018-09-30 DIAGNOSIS — C9000 Multiple myeloma not having achieved remission: Secondary | ICD-10-CM

## 2018-09-30 NOTE — Telephone Encounter (Signed)
Returned call to [place] and advised of recommendations as per T. Parrett, NP.  She acknowledged understanding and had no further questions.  Nothing further needed.

## 2018-09-30 NOTE — Telephone Encounter (Signed)
That is fine to go up to 4l/m of oxygen if needed, if improving can decreased to 3 l/m as able  Goal O2 sAt >88-90%.   Fine on mucinex Twice daily  As needed    Please contact office for sooner follow up if symptoms do not improve or worsen or seek emergency care

## 2018-09-30 NOTE — Telephone Encounter (Signed)
Call returned to [place] (dpr), she reports Mr. Palin takes one tablet of mucinex daily and she is requesting to let him take the mucinex more frequently. She states she feel like he is having increased mucous production and would like to give the mucinex at least twice daily.  She also states Mr. winham as increased his oxygen to 4L because he is feeling more SOB. She reports he should be using 2.5L. I made her aware typically if patients are increasing their oxygen due to increased SOB they will need an OV to be evaluated however she states she spoke with TP this morning and she is familiar with him and would relay her recommendations. She denies that his oxygen saturations is dropping but states he feels more SOB so he will increase the oxygen on his own.   TP please advise how frequent patient can take mucinex and any recommendations regarding his oxygen. Thanks. She states she spoke with you this AM and these are things she forgot to ask?

## 2018-09-30 NOTE — Telephone Encounter (Signed)
R/s apt per 9/1 sch message - pt daughter is aware of appt date and time

## 2018-10-01 ENCOUNTER — Telehealth: Payer: Self-pay | Admitting: Nurse Practitioner

## 2018-10-01 ENCOUNTER — Encounter: Payer: Self-pay | Admitting: Nurse Practitioner

## 2018-10-01 ENCOUNTER — Other Ambulatory Visit: Payer: Medicare Other

## 2018-10-01 ENCOUNTER — Other Ambulatory Visit: Payer: Self-pay

## 2018-10-01 ENCOUNTER — Inpatient Hospital Stay: Payer: Medicare Other

## 2018-10-01 ENCOUNTER — Ambulatory Visit: Payer: Medicare Other

## 2018-10-01 ENCOUNTER — Inpatient Hospital Stay (HOSPITAL_BASED_OUTPATIENT_CLINIC_OR_DEPARTMENT_OTHER): Payer: Medicare Other | Admitting: Nurse Practitioner

## 2018-10-01 ENCOUNTER — Ambulatory Visit: Payer: Medicare Other | Admitting: Nurse Practitioner

## 2018-10-01 VITALS — BP 118/73 | HR 78 | Temp 98.9°F | Resp 17 | Ht 73.0 in | Wt 146.9 lb

## 2018-10-01 DIAGNOSIS — I5032 Chronic diastolic (congestive) heart failure: Secondary | ICD-10-CM | POA: Diagnosis not present

## 2018-10-01 DIAGNOSIS — C9000 Multiple myeloma not having achieved remission: Secondary | ICD-10-CM

## 2018-10-01 DIAGNOSIS — R0609 Other forms of dyspnea: Secondary | ICD-10-CM | POA: Diagnosis not present

## 2018-10-01 DIAGNOSIS — J439 Emphysema, unspecified: Secondary | ICD-10-CM | POA: Diagnosis not present

## 2018-10-01 DIAGNOSIS — N189 Chronic kidney disease, unspecified: Secondary | ICD-10-CM | POA: Diagnosis not present

## 2018-10-01 DIAGNOSIS — I255 Ischemic cardiomyopathy: Secondary | ICD-10-CM | POA: Diagnosis not present

## 2018-10-01 DIAGNOSIS — R0600 Dyspnea, unspecified: Secondary | ICD-10-CM | POA: Diagnosis not present

## 2018-10-01 DIAGNOSIS — D508 Other iron deficiency anemias: Secondary | ICD-10-CM

## 2018-10-01 DIAGNOSIS — D631 Anemia in chronic kidney disease: Secondary | ICD-10-CM | POA: Diagnosis not present

## 2018-10-01 LAB — SAMPLE TO BLOOD BANK

## 2018-10-01 LAB — CBC WITH DIFFERENTIAL (CANCER CENTER ONLY)
Abs Immature Granulocytes: 0.03 10*3/uL (ref 0.00–0.07)
Basophils Absolute: 0 10*3/uL (ref 0.0–0.1)
Basophils Relative: 0 %
Eosinophils Absolute: 0 10*3/uL (ref 0.0–0.5)
Eosinophils Relative: 1 %
HCT: 31.8 % — ABNORMAL LOW (ref 39.0–52.0)
Hemoglobin: 10.1 g/dL — ABNORMAL LOW (ref 13.0–17.0)
Immature Granulocytes: 2 %
Lymphocytes Relative: 26 %
Lymphs Abs: 0.4 10*3/uL — ABNORMAL LOW (ref 0.7–4.0)
MCH: 26.3 pg (ref 26.0–34.0)
MCHC: 31.8 g/dL (ref 30.0–36.0)
MCV: 82.8 fL (ref 80.0–100.0)
Monocytes Absolute: 0.1 10*3/uL (ref 0.1–1.0)
Monocytes Relative: 4 %
Neutro Abs: 1.1 10*3/uL — ABNORMAL LOW (ref 1.7–7.7)
Neutrophils Relative %: 67 %
Platelet Count: 46 10*3/uL — ABNORMAL LOW (ref 150–400)
RBC: 3.84 MIL/uL — ABNORMAL LOW (ref 4.22–5.81)
RDW: 17.8 % — ABNORMAL HIGH (ref 11.5–15.5)
WBC Count: 1.6 10*3/uL — ABNORMAL LOW (ref 4.0–10.5)
nRBC: 2.5 % — ABNORMAL HIGH (ref 0.0–0.2)

## 2018-10-01 NOTE — Progress Notes (Signed)
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 Transmission reviewed.  Advised of decreased impedance and Pulmonologist increased Lasix x 1 day.  He received blood transfusion on Wednesday.  She said he has not been doing very well and was afraid that he would not make it through last week but he is feeling better now.  CorVue thoracic impedancenormal but was suggestive of fluid accumulation from 8/23 - 8/29.  Prescribed:Furosemide 20 mgTake 1 tablet (20 mg total) by mouth daily as needed for edema.Daughter previously reported pt does not like to take PRN Furosemide because he does self catheterizations.  Labs: 09/27/2018 BNP 1,073.2 09/20/2018 Creatinine 1.16, BUN 31, Potassium 3.9, Sodium 142, GFR 55->60 09/06/2018 Creatinine 1.26, BUN 37, Potassium 4.2, Sodium 144, GFR 50-57 07/28/2018 Creatinine 1.16, BUN 31, Potassium 4.0, Sodium 139, GFR 55->60 07/26/2018 Creatinine 1.09, BUN 29, Potassium 4.3, Sodium 145, GFR 59->60  06/28/2018 Creatinine 1.05, BUN 30, Potassium 4.1, Sodium 143, GFR >60  05/10/2018 Creatinine 1.11, BUN 28, Potassium 4.1, Sodium 142, GFR 58->60  A complete set of results can be found in Results Review.  Recommendations: No changes and encouraged to call if experiencing any fluid symptoms.  Follow-up plan: ICM clinic phone appointment on 11/24/2018.  91 day device clinic remote transmission 11/23/2018.    Copy of ICM check sent to Dr. Rayann Heman.   3 month ICM trend: 09/28/2018    1 Year ICM trend:       Rosalene Billings, RN 10/01/2018 11:33 AM

## 2018-10-01 NOTE — Telephone Encounter (Signed)
Called and spoke with daughter. Confirmed appts

## 2018-10-01 NOTE — Progress Notes (Addendum)
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  He was transfused 2 units of blood on 09/28/2018.  He reports breathing is much better.  He attributes this to a combination of the blood transfusion steroids and Lasix.  He denies bleeding.  No fever.  Objective:  Vital signs in last 24 hours:  Blood pressure 118/73, pulse 78, temperature 98.9 F (37.2 C), temperature source Temporal, resp. rate 17, height 6' 1" (1.854 m), weight 146 lb 14.4 oz (66.6 kg), SpO2 100 %.   Resp: Distant breath sounds.  Wheeze at the left lower lung field.  No respiratory distress. Cardio: Regular rate and rhythm. Vascular: Trace edema at the lower legs bilaterally. Neuro: Alert and oriented. Skin: Ecchymoses scattered over the dorsum of the hands and forearms.   Lab Results:  Lab Results  Component Value Date   WBC 1.6 (L) 10/01/2018   HGB 10.1 (L) 10/01/2018   HCT 31.8 (L) 10/01/2018   MCV 82.8 10/01/2018   PLT 46 (L) 10/01/2018   NEUTROABS 1.1 (L) 10/01/2018    Imaging:  No results found.  Medications: I have reviewed the patient's current medications.  Assessment/Plan: 1.Pancytopenia-transfused with packed red blood cells 11/28/2016, 12/27/2016, 01/13/2017, 02/02/2017 2. History of beta thalassemia trait 3. History of vitamin B-12 deficiency-vitamin B-12 replacement initiated 11/11/2016 4. History of a serum monoclonal IgAkappaprotein-elevated serum free kappa light chains, IgA, and M spike10/16/2018  Bone marrow biopsy 12/03/2016-consistent with multiple myeloma  Bone survey 12/06/2016-no destructive or lytic bone lesions identified.  Revlimid 21 days on/7 days off and weekly dexamethasone 12/16/2016(Decadron discontinued 12/29/2016 secondary to altered mental status)  Cycle 2 single agent Revlimid 01/14/2017(discontinued 3 days early)  Serum light chains, IgAimproved 02/02/2017  Cycle3 single  agent Revlimid beginning 02/16/2017  Cycle4single agent Revlimid beginning 03/16/2017  Cycle 5 single agent Revlimid beginning 04/14/2017(Revlimid placed on hold beginning 04/28/2017 due to thrombocytopenia)  Cycle 6 single agent Revlimid beginning 05/23/2017 x 14 days  Cycle 1 melphalan/prednisone 08/16/2018  No further chemotherapy planned 10/01/2018 5. COPD with acute and chronic respiratory failure/hypoxemia 6. History of coronary artery disease 7. Ischemic cardiomyopathy 8. ICD in place 9. Prostatic hypertrophy 10.Inappropriately low erythropoietin level 11/28/2016 11. Severe pancytopenia secondary to multiple myeloma and Revlimid-improved 12. Fever-likely related to an upper respiratory infection, RSV positive swab 02/02/2017 13.Anemia secondary to renal insufficiency-trial of weekly erythropoietin initiated 06/17/2017.Erythropoietin dose increased to 60,000 units weekly beginning 07/28/2017.  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  The dyspnea is better coinciding with a blood transfusion, steroids and diuretic.  Hemoglobin today is 10.1.  He continues to have neutropenia and thrombocytopenia.  He understands to contact the office with fever, chills, other signs of infection, bleeding.  With regard to the myeloma he completed 1 cycle of melphalan/prednisone beginning 08/16/2018.  He has decided to discontinue further chemotherapy.  We also discussed whether or not to continue erythropoietin injections.  Not clear that he was deriving benefit from the injections.  We decided to discontinue erythropoietin. We will continue to follow with routine labs with transfusion support as needed.  He will return for a lab appointment in 2 weeks.  He will return for lab and follow-up in 4 weeks.  He will contact the office in the interim as outlined above or with any other problems.  Patient seen with  Dr. Sherrill.      ANP/GNP-BC     10/01/2018  12:55 PM This was a shared visit with  .  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  He has decided to discontinue further systemic therapy for treatment of myeloma.  He will also discontinue erythropoietin injections.  The plan is to continue Red cell transfusion support as needed.  Brad Sherrill, MD       

## 2018-10-04 ENCOUNTER — Telehealth: Payer: Self-pay | Admitting: Pulmonary Disease

## 2018-10-04 DIAGNOSIS — C9000 Multiple myeloma not having achieved remission: Secondary | ICD-10-CM

## 2018-10-04 MED ORDER — PREDNISONE 20 MG PO TABS: ORAL_TABLET | ORAL | 0 refills | Status: DC

## 2018-10-04 NOTE — Telephone Encounter (Signed)
Pt called to report worsening dyspnea after he got off prednisone taper and is requesting more prednisone. Another prednisone taper called in to pharmacy. He will call office back if symptoms do not improve.

## 2018-10-06 ENCOUNTER — Telehealth: Payer: Self-pay | Admitting: *Deleted

## 2018-10-06 ENCOUNTER — Telehealth: Payer: Self-pay | Admitting: Nurse Practitioner

## 2018-10-06 ENCOUNTER — Other Ambulatory Visit: Payer: Self-pay | Admitting: Nurse Practitioner

## 2018-10-06 DIAGNOSIS — C9000 Multiple myeloma not having achieved remission: Secondary | ICD-10-CM

## 2018-10-06 NOTE — Telephone Encounter (Signed)
Daughter left VM asking to set up patient for routine blood transfusions instead of sporadic. Also wants him to have flu vaccine at next visit if it is OK. Asking to speak w/Lisa, NP or Dr. [name] when available regarding this and other questions she has.

## 2018-10-06 NOTE — Telephone Encounter (Signed)
Returned call to [place] [employer].  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  He can receive the flu vaccine on 10/15/2018.  Message sent to scheduling.

## 2018-10-07 ENCOUNTER — Telehealth: Payer: Self-pay | Admitting: *Deleted

## 2018-10-07 ENCOUNTER — Inpatient Hospital Stay: Payer: Medicare Other

## 2018-10-07 DIAGNOSIS — I5032 Chronic diastolic (congestive) heart failure: Secondary | ICD-10-CM | POA: Diagnosis not present

## 2018-10-07 DIAGNOSIS — R0609 Other forms of dyspnea: Secondary | ICD-10-CM | POA: Diagnosis not present

## 2018-10-07 DIAGNOSIS — J439 Emphysema, unspecified: Secondary | ICD-10-CM | POA: Diagnosis not present

## 2018-10-07 DIAGNOSIS — R300 Dysuria: Secondary | ICD-10-CM

## 2018-10-07 DIAGNOSIS — C9000 Multiple myeloma not having achieved remission: Secondary | ICD-10-CM | POA: Diagnosis not present

## 2018-10-07 DIAGNOSIS — N189 Chronic kidney disease, unspecified: Secondary | ICD-10-CM | POA: Diagnosis not present

## 2018-10-07 DIAGNOSIS — D631 Anemia in chronic kidney disease: Secondary | ICD-10-CM | POA: Diagnosis not present

## 2018-10-07 LAB — URINALYSIS, COMPLETE (UACMP) WITH MICROSCOPIC
Bilirubin Urine: NEGATIVE
Glucose, UA: NEGATIVE mg/dL
Ketones, ur: NEGATIVE mg/dL
Nitrite: NEGATIVE
Protein, ur: 30 mg/dL — AB
Specific Gravity, Urine: 1.015 (ref 1.005–1.030)
WBC, UA: >50 WBC/hpf — ABNORMAL HIGH (ref 0–5)
pH: 5 (ref 5.0–8.0)

## 2018-10-07 NOTE — Telephone Encounter (Signed)
U/A results show bacteria, WBCs, Hgb large. Faxed results to Dr. Glenna Durand office and also called and left VM for his nurse to review w/MD. Confirmed w/operator that nurse is in today. Requested they call daughter with directions. Culture pending.

## 2018-10-07 NOTE — Telephone Encounter (Signed)
Daughter thinks he has another UTI and is asking for urine cup to be left at front desk to pick up and sample will be brought back in to clinic. Send results to Dr. Lysle Rubens if Dr. [name] does not want to manage the results. Per Dr. [name]: OK to collect and run test here and send results to Dr. Lysle Rubens to manage. Notified daughter that sample cup/wipes and directions on how to collect clean sample via cath was included as well.

## 2018-10-08 ENCOUNTER — Telehealth: Payer: Self-pay

## 2018-10-08 ENCOUNTER — Telehealth: Payer: Self-pay | Admitting: Oncology

## 2018-10-08 LAB — URINE CULTURE: Culture: <10000 — AB

## 2018-10-08 NOTE — Telephone Encounter (Signed)
TC to pt per Dr. [name]. Spoke with patients daughter and let her know that urine culture is negative. I also sent copy to Dr. Lysle Rubens (fax# [phone]). No further problems or concerns at this time

## 2018-10-08 NOTE — Telephone Encounter (Signed)
Spoke with dtr re appointments for September/October.

## 2018-10-12 ENCOUNTER — Telehealth: Payer: Self-pay | Admitting: Pulmonary Disease

## 2018-10-12 NOTE — Telephone Encounter (Signed)
Called by Mr. Roblyer' daughter who relates that her dad is having increased SOB and bradycardia with HR = 50. His HR = 78 on a recent office visit. She denies that he has had fever or change in amount or color of sputum. Sat on 4 L/min [place] = 100%. He has extra Prednisone tablets and has taken 20 mg already today along with his long acting and rescue bronchodilators. I asked her to give him an extra 20 mg of Prednisone tonight and not to take any further Metoprolol until he is seen by his physician. Upon further review of his chart, I can't be sure that this is not heart failure or some cardiac ischemic event and recommended that given his age, new onset of bradycardia and state of health that he should come to the emergency for further evaluation and possible hospital admission.

## 2018-10-13 ENCOUNTER — Other Ambulatory Visit: Payer: Self-pay | Admitting: *Deleted

## 2018-10-13 ENCOUNTER — Telehealth: Payer: Self-pay | Admitting: Adult Health

## 2018-10-13 ENCOUNTER — Telehealth: Payer: Self-pay | Admitting: *Deleted

## 2018-10-13 DIAGNOSIS — D61818 Other pancytopenia: Secondary | ICD-10-CM | POA: Diagnosis not present

## 2018-10-13 DIAGNOSIS — I5030 Unspecified diastolic (congestive) heart failure: Secondary | ICD-10-CM | POA: Diagnosis not present

## 2018-10-13 DIAGNOSIS — E1122 Type 2 diabetes mellitus with diabetic chronic kidney disease: Secondary | ICD-10-CM | POA: Diagnosis not present

## 2018-10-13 DIAGNOSIS — N183 Chronic kidney disease, stage 3 (moderate): Secondary | ICD-10-CM | POA: Diagnosis not present

## 2018-10-13 DIAGNOSIS — E119 Type 2 diabetes mellitus without complications: Secondary | ICD-10-CM | POA: Diagnosis not present

## 2018-10-13 DIAGNOSIS — E782 Mixed hyperlipidemia: Secondary | ICD-10-CM | POA: Diagnosis not present

## 2018-10-13 DIAGNOSIS — I2510 Atherosclerotic heart disease of native coronary artery without angina pectoris: Secondary | ICD-10-CM | POA: Diagnosis not present

## 2018-10-13 DIAGNOSIS — I252 Old myocardial infarction: Secondary | ICD-10-CM | POA: Diagnosis not present

## 2018-10-13 DIAGNOSIS — D649 Anemia, unspecified: Secondary | ICD-10-CM

## 2018-10-13 DIAGNOSIS — N401 Enlarged prostate with lower urinary tract symptoms: Secondary | ICD-10-CM | POA: Diagnosis not present

## 2018-10-13 DIAGNOSIS — I10 Essential (primary) hypertension: Secondary | ICD-10-CM | POA: Diagnosis not present

## 2018-10-13 DIAGNOSIS — J441 Chronic obstructive pulmonary disease with (acute) exacerbation: Secondary | ICD-10-CM | POA: Diagnosis not present

## 2018-10-13 DIAGNOSIS — J449 Chronic obstructive pulmonary disease, unspecified: Secondary | ICD-10-CM | POA: Diagnosis not present

## 2018-10-13 NOTE — Telephone Encounter (Signed)
Daughter asking for his lab/transfusion to be moved to tomorrow. He is very short of breath. High priority scheduling message sent.

## 2018-10-13 NOTE — Progress Notes (Signed)
Transfusion orders placed for 10/14/18.

## 2018-10-13 NOTE — Telephone Encounter (Signed)
Dr Elsworth Soho, do you want to see this pt when you open up or have him see NP again?

## 2018-10-13 NOTE — Telephone Encounter (Signed)
He will need follow up in clinic as there have been several calls over the past few months.  Please make appointment. I think he is Dr. Bari Mantis patients. Has seen Tammy recently.

## 2018-10-13 NOTE — Telephone Encounter (Signed)
Called spoke with daughter. Offered her a televisit or mychart visit . She said she was not with her dad. She only wants to talk with Tammy Parrett as TP knows her father's health history best. She wants TP to call her pesronally to discuss the issues with her dad tomorrow 10/14/18 , when she has a free minute. Daughters number is [phone].  She started explaining to me that patient has worsening shortness of breath from 4p-7p and then throughout the night. Daughter wants to know if there is anything that can be done during these troubling hours to help her dad with his increased shortness of breath. Patient is better in the morning.   Let daughter know TP was out of office today and will return, she said that was okay with her . Will route to TP

## 2018-10-14 ENCOUNTER — Inpatient Hospital Stay: Payer: Medicare Other

## 2018-10-14 ENCOUNTER — Other Ambulatory Visit: Payer: Self-pay

## 2018-10-14 ENCOUNTER — Encounter (HOSPITAL_COMMUNITY): Payer: Self-pay

## 2018-10-14 ENCOUNTER — Inpatient Hospital Stay (HOSPITAL_BASED_OUTPATIENT_CLINIC_OR_DEPARTMENT_OTHER): Payer: Medicare Other | Admitting: Medical

## 2018-10-14 ENCOUNTER — Inpatient Hospital Stay (HOSPITAL_COMMUNITY)
Admission: EM | Admit: 2018-10-14 | Discharge: 2018-10-16 | DRG: 280 | Disposition: A | Discharge status: Home Health | Payer: Medicare Other | Attending: Family Medicine | Admitting: Family Medicine

## 2018-10-14 ENCOUNTER — Other Ambulatory Visit: Payer: Self-pay | Admitting: Emergency Medicine

## 2018-10-14 ENCOUNTER — Emergency Department (HOSPITAL_COMMUNITY): Payer: Medicare Other

## 2018-10-14 VITALS — BP 117/74 | HR 88 | Temp 97.8°F | Resp 17 | Ht 73.0 in

## 2018-10-14 VITALS — BP 138/72 | HR 100 | Resp 24

## 2018-10-14 DIAGNOSIS — D649 Anemia, unspecified: Secondary | ICD-10-CM

## 2018-10-14 DIAGNOSIS — I2510 Atherosclerotic heart disease of native coronary artery without angina pectoris: Secondary | ICD-10-CM | POA: Diagnosis present

## 2018-10-14 DIAGNOSIS — I340 Nonrheumatic mitral (valve) insufficiency: Secondary | ICD-10-CM | POA: Diagnosis not present

## 2018-10-14 DIAGNOSIS — R778 Other specified abnormalities of plasma proteins: Secondary | ICD-10-CM

## 2018-10-14 DIAGNOSIS — I214 Non-ST elevation (NSTEMI) myocardial infarction: Secondary | ICD-10-CM | POA: Diagnosis present

## 2018-10-14 DIAGNOSIS — C9000 Multiple myeloma not having achieved remission: Secondary | ICD-10-CM

## 2018-10-14 DIAGNOSIS — I342 Nonrheumatic mitral (valve) stenosis: Secondary | ICD-10-CM | POA: Diagnosis not present

## 2018-10-14 DIAGNOSIS — H269 Unspecified cataract: Secondary | ICD-10-CM | POA: Diagnosis present

## 2018-10-14 DIAGNOSIS — N189 Chronic kidney disease, unspecified: Secondary | ICD-10-CM | POA: Diagnosis not present

## 2018-10-14 DIAGNOSIS — J9601 Acute respiratory failure with hypoxia: Secondary | ICD-10-CM

## 2018-10-14 DIAGNOSIS — D508 Other iron deficiency anemias: Secondary | ICD-10-CM

## 2018-10-14 DIAGNOSIS — E538 Deficiency of other specified B group vitamins: Secondary | ICD-10-CM

## 2018-10-14 DIAGNOSIS — N401 Enlarged prostate with lower urinary tract symptoms: Secondary | ICD-10-CM | POA: Diagnosis present

## 2018-10-14 DIAGNOSIS — J810 Acute pulmonary edema: Secondary | ICD-10-CM | POA: Diagnosis not present

## 2018-10-14 DIAGNOSIS — K5730 Diverticulosis of large intestine without perforation or abscess without bleeding: Secondary | ICD-10-CM | POA: Diagnosis present

## 2018-10-14 DIAGNOSIS — R339 Retention of urine, unspecified: Secondary | ICD-10-CM | POA: Diagnosis present

## 2018-10-14 DIAGNOSIS — J439 Emphysema, unspecified: Secondary | ICD-10-CM | POA: Diagnosis present

## 2018-10-14 DIAGNOSIS — N179 Acute kidney failure, unspecified: Secondary | ICD-10-CM | POA: Diagnosis present

## 2018-10-14 DIAGNOSIS — E1122 Type 2 diabetes mellitus with diabetic chronic kidney disease: Secondary | ICD-10-CM | POA: Diagnosis present

## 2018-10-14 DIAGNOSIS — I5033 Acute on chronic diastolic (congestive) heart failure: Principal | ICD-10-CM | POA: Diagnosis present

## 2018-10-14 DIAGNOSIS — J441 Chronic obstructive pulmonary disease with (acute) exacerbation: Secondary | ICD-10-CM

## 2018-10-14 DIAGNOSIS — Z9981 Dependence on supplemental oxygen: Secondary | ICD-10-CM

## 2018-10-14 DIAGNOSIS — R0600 Dyspnea, unspecified: Secondary | ICD-10-CM

## 2018-10-14 DIAGNOSIS — Z803 Family history of malignant neoplasm of breast: Secondary | ICD-10-CM

## 2018-10-14 DIAGNOSIS — Z95810 Presence of automatic (implantable) cardiac defibrillator: Secondary | ICD-10-CM

## 2018-10-14 DIAGNOSIS — Z7189 Other specified counseling: Secondary | ICD-10-CM | POA: Diagnosis not present

## 2018-10-14 DIAGNOSIS — J9621 Acute and chronic respiratory failure with hypoxia: Secondary | ICD-10-CM | POA: Diagnosis present

## 2018-10-14 DIAGNOSIS — Z9119 Patient's noncompliance with other medical treatment and regimen: Secondary | ICD-10-CM

## 2018-10-14 DIAGNOSIS — Z801 Family history of malignant neoplasm of trachea, bronchus and lung: Secondary | ICD-10-CM

## 2018-10-14 DIAGNOSIS — E785 Hyperlipidemia, unspecified: Secondary | ICD-10-CM | POA: Diagnosis present

## 2018-10-14 DIAGNOSIS — N289 Disorder of kidney and ureter, unspecified: Secondary | ICD-10-CM

## 2018-10-14 DIAGNOSIS — R1310 Dysphagia, unspecified: Secondary | ICD-10-CM | POA: Diagnosis present

## 2018-10-14 DIAGNOSIS — I252 Old myocardial infarction: Secondary | ICD-10-CM | POA: Diagnosis not present

## 2018-10-14 DIAGNOSIS — Z9114 Patient's other noncompliance with medication regimen: Secondary | ICD-10-CM

## 2018-10-14 DIAGNOSIS — Z955 Presence of coronary angioplasty implant and graft: Secondary | ICD-10-CM

## 2018-10-14 DIAGNOSIS — H3530 Unspecified macular degeneration: Secondary | ICD-10-CM | POA: Diagnosis present

## 2018-10-14 DIAGNOSIS — Z66 Do not resuscitate: Secondary | ICD-10-CM | POA: Diagnosis present

## 2018-10-14 DIAGNOSIS — I255 Ischemic cardiomyopathy: Secondary | ICD-10-CM

## 2018-10-14 DIAGNOSIS — D630 Anemia in neoplastic disease: Secondary | ICD-10-CM | POA: Diagnosis present

## 2018-10-14 DIAGNOSIS — R7989 Other specified abnormal findings of blood chemistry: Secondary | ICD-10-CM | POA: Diagnosis not present

## 2018-10-14 DIAGNOSIS — I5021 Acute systolic (congestive) heart failure: Secondary | ICD-10-CM | POA: Diagnosis not present

## 2018-10-14 DIAGNOSIS — I5031 Acute diastolic (congestive) heart failure: Secondary | ICD-10-CM | POA: Diagnosis not present

## 2018-10-14 DIAGNOSIS — Z20828 Contact with and (suspected) exposure to other viral communicable diseases: Secondary | ICD-10-CM | POA: Diagnosis present

## 2018-10-14 DIAGNOSIS — I5032 Chronic diastolic (congestive) heart failure: Secondary | ICD-10-CM

## 2018-10-14 DIAGNOSIS — H9190 Unspecified hearing loss, unspecified ear: Secondary | ICD-10-CM | POA: Diagnosis present

## 2018-10-14 DIAGNOSIS — R338 Other retention of urine: Secondary | ICD-10-CM | POA: Diagnosis present

## 2018-10-14 DIAGNOSIS — N183 Chronic kidney disease, stage 3 (moderate): Secondary | ICD-10-CM | POA: Diagnosis present

## 2018-10-14 DIAGNOSIS — D61818 Other pancytopenia: Secondary | ICD-10-CM | POA: Diagnosis present

## 2018-10-14 DIAGNOSIS — R5381 Other malaise: Secondary | ICD-10-CM | POA: Diagnosis not present

## 2018-10-14 DIAGNOSIS — K228 Other specified diseases of esophagus: Secondary | ICD-10-CM | POA: Diagnosis present

## 2018-10-14 DIAGNOSIS — Z515 Encounter for palliative care: Secondary | ICD-10-CM | POA: Diagnosis present

## 2018-10-14 DIAGNOSIS — Z7952 Long term (current) use of systemic steroids: Secondary | ICD-10-CM

## 2018-10-14 DIAGNOSIS — Z23 Encounter for immunization: Secondary | ICD-10-CM | POA: Diagnosis not present

## 2018-10-14 DIAGNOSIS — M2550 Pain in unspecified joint: Secondary | ICD-10-CM | POA: Diagnosis not present

## 2018-10-14 DIAGNOSIS — Z792 Long term (current) use of antibiotics: Secondary | ICD-10-CM

## 2018-10-14 DIAGNOSIS — Z7401 Bed confinement status: Secondary | ICD-10-CM | POA: Diagnosis not present

## 2018-10-14 DIAGNOSIS — J811 Chronic pulmonary edema: Secondary | ICD-10-CM | POA: Diagnosis not present

## 2018-10-14 DIAGNOSIS — R0602 Shortness of breath: Secondary | ICD-10-CM | POA: Diagnosis not present

## 2018-10-14 DIAGNOSIS — K649 Unspecified hemorrhoids: Secondary | ICD-10-CM | POA: Diagnosis present

## 2018-10-14 DIAGNOSIS — Z823 Family history of stroke: Secondary | ICD-10-CM

## 2018-10-14 DIAGNOSIS — Z87891 Personal history of nicotine dependence: Secondary | ICD-10-CM

## 2018-10-14 DIAGNOSIS — Z8249 Family history of ischemic heart disease and other diseases of the circulatory system: Secondary | ICD-10-CM

## 2018-10-14 DIAGNOSIS — Z79899 Other long term (current) drug therapy: Secondary | ICD-10-CM

## 2018-10-14 LAB — TROPONIN I (HIGH SENSITIVITY)
Troponin I (High Sensitivity): 236 ng/L (ref <18)
Troponin I (High Sensitivity): 1048 ng/L (ref <18)
Troponin I (High Sensitivity): 3613 ng/L (ref <18)
Troponin I (High Sensitivity): 4024 ng/L (ref <18)

## 2018-10-14 LAB — CBC WITH DIFFERENTIAL/PLATELET
Abs Immature Granulocytes: 0.06 10*3/uL (ref 0.00–0.07)
Basophils Absolute: 0 10*3/uL (ref 0.0–0.1)
Basophils Relative: 0 %
Eosinophils Absolute: 0 10*3/uL (ref 0.0–0.5)
Eosinophils Relative: 0 %
HCT: 32.2 % — ABNORMAL LOW (ref 39.0–52.0)
Hemoglobin: 9.9 g/dL — ABNORMAL LOW (ref 13.0–17.0)
Immature Granulocytes: 1 %
Lymphocytes Relative: 6 %
Lymphs Abs: 0.3 10*3/uL — ABNORMAL LOW (ref 0.7–4.0)
MCH: 26.5 pg (ref 26.0–34.0)
MCHC: 30.7 g/dL (ref 30.0–36.0)
MCV: 86.1 fL (ref 80.0–100.0)
Monocytes Absolute: 0.3 10*3/uL (ref 0.1–1.0)
Monocytes Relative: 5 %
Neutro Abs: 4.6 10*3/uL (ref 1.7–7.7)
Neutrophils Relative %: 88 %
Platelets: 82 10*3/uL — ABNORMAL LOW (ref 150–400)
RBC: 3.74 MIL/uL — ABNORMAL LOW (ref 4.22–5.81)
RDW: 17.7 % — ABNORMAL HIGH (ref 11.5–15.5)
WBC: 5.2 10*3/uL (ref 4.0–10.5)
nRBC: 1.5 % — ABNORMAL HIGH (ref 0.0–0.2)

## 2018-10-14 LAB — CK TOTAL AND CKMB (NOT AT ARMC)
CK, MB: 19.7 ng/mL — ABNORMAL HIGH (ref 0.5–5.0)
Relative Index: 18.1 — ABNORMAL HIGH (ref 0.0–2.5)
Total CK: 109 U/L (ref 49–397)

## 2018-10-14 LAB — CBC WITH DIFFERENTIAL (CANCER CENTER ONLY)
Abs Immature Granulocytes: 0.05 10*3/uL (ref 0.00–0.07)
Basophils Absolute: 0 10*3/uL (ref 0.0–0.1)
Basophils Relative: 0 %
Eosinophils Absolute: 0 10*3/uL (ref 0.0–0.5)
Eosinophils Relative: 0 %
HCT: 29.7 % — ABNORMAL LOW (ref 39.0–52.0)
Hemoglobin: 9.3 g/dL — ABNORMAL LOW (ref 13.0–17.0)
Immature Granulocytes: 1 %
Lymphocytes Relative: 16 %
Lymphs Abs: 0.7 10*3/uL (ref 0.7–4.0)
MCH: 25.9 pg — ABNORMAL LOW (ref 26.0–34.0)
MCHC: 31.3 g/dL (ref 30.0–36.0)
MCV: 82.7 fL (ref 80.0–100.0)
Monocytes Absolute: 0.2 10*3/uL (ref 0.1–1.0)
Monocytes Relative: 3 %
Neutro Abs: 3.5 10*3/uL (ref 1.7–7.7)
Neutrophils Relative %: 80 %
Platelet Count: 71 10*3/uL — ABNORMAL LOW (ref 150–400)
RBC: 3.59 MIL/uL — ABNORMAL LOW (ref 4.22–5.81)
RDW: 17.5 % — ABNORMAL HIGH (ref 11.5–15.5)
WBC Count: 4.4 10*3/uL (ref 4.0–10.5)
nRBC: 2.1 % — ABNORMAL HIGH (ref 0.0–0.2)

## 2018-10-14 LAB — COMPREHENSIVE METABOLIC PANEL
ALT: 30 U/L (ref 0–44)
AST: 27 U/L (ref 15–41)
Albumin: 3.6 g/dL (ref 3.5–5.0)
Alkaline Phosphatase: 48 U/L (ref 38–126)
Anion gap: 12 (ref 5–15)
BUN: 67 mg/dL — ABNORMAL HIGH (ref 8–23)
CO2: 24 mmol/L (ref 22–32)
Calcium: 9 mg/dL (ref 8.9–10.3)
Chloride: 103 mmol/L (ref 98–111)
Creatinine, Ser: 1.72 mg/dL — ABNORMAL HIGH (ref 0.61–1.24)
GFR calc Af Amer: 39 mL/min — ABNORMAL LOW (ref >60)
GFR calc non Af Amer: 34 mL/min — ABNORMAL LOW (ref >60)
Glucose, Bld: 145 mg/dL — ABNORMAL HIGH (ref 70–99)
Potassium: 5.3 mmol/L — ABNORMAL HIGH (ref 3.5–5.1)
Sodium: 139 mmol/L (ref 135–145)
Total Bilirubin: 1 mg/dL (ref 0.3–1.2)
Total Protein: 6.4 g/dL — ABNORMAL LOW (ref 6.5–8.1)

## 2018-10-14 LAB — CBG MONITORING, ED
Glucose-Capillary: 75 mg/dL (ref 70–99)
Glucose-Capillary: 95 mg/dL (ref 70–99)

## 2018-10-14 LAB — PREPARE RBC (CROSSMATCH)

## 2018-10-14 LAB — BRAIN NATRIURETIC PEPTIDE
B Natriuretic Peptide: 1596.5 pg/mL — ABNORMAL HIGH (ref 0.0–100.0)
B Natriuretic Peptide: 1397.7 pg/mL — ABNORMAL HIGH (ref 0.0–100.0)

## 2018-10-14 LAB — SARS CORONAVIRUS 2 BY RT PCR (HOSPITAL ORDER, PERFORMED IN ~~LOC~~ HOSPITAL LAB): SARS Coronavirus 2: NEGATIVE

## 2018-10-14 MED ORDER — SODIUM CHLORIDE 0.9% FLUSH: 3.0000 mL | INTRAVENOUS | Status: DC | PRN

## 2018-10-14 MED ORDER — FUROSEMIDE 10 MG/ML IJ SOLN
60.0000 mg | Freq: Two times a day (BID) | INTRAMUSCULAR | Status: DC
  Administered 2018-10-14 – 2018-10-16 (×3): 60 mg via INTRAVENOUS
  Filled 2018-10-14: qty 6
  Filled 2018-10-14: qty 8
  Filled 2018-10-14: qty 6

## 2018-10-14 MED ORDER — EPOETIN ALFA 40000 UNIT/ML IJ SOLN
INTRAMUSCULAR | Status: AC
  Filled 2018-10-14: qty 1

## 2018-10-14 MED ORDER — FUROSEMIDE 10 MG/ML IJ SOLN
60.0000 mg | Freq: Once | INTRAMUSCULAR | Status: AC
  Administered 2018-10-14: 13:00:00 60 mg via INTRAVENOUS
  Filled 2018-10-14: qty 8

## 2018-10-14 MED ORDER — ASPIRIN EC 81 MG PO TBEC
81.0000 mg | DELAYED_RELEASE_TABLET | Freq: Every day | ORAL | Status: DC
  Administered 2018-10-14 – 2018-10-16 (×3): 81 mg via ORAL
  Filled 2018-10-14 (×3): qty 1

## 2018-10-14 MED ORDER — HEPARIN (PORCINE) 25000 UT/250ML-% IV SOLN
800.0000 [IU]/h | INTRAVENOUS | Status: DC
  Administered 2018-10-14 – 2018-10-15 (×2): 800 [IU]/h via INTRAVENOUS
  Filled 2018-10-14 (×2): qty 250

## 2018-10-14 MED ORDER — ALPRAZOLAM 0.25 MG PO TABS
0.2500 mg | ORAL_TABLET | Freq: Every evening | ORAL | Status: DC | PRN
  Administered 2018-10-15: 0.25 mg via ORAL
  Filled 2018-10-14 (×2): qty 1

## 2018-10-14 MED ORDER — FERROUS SULFATE 325 (65 FE) MG PO TABS
325.0000 mg | ORAL_TABLET | Freq: Two times a day (BID) | ORAL | Status: DC
  Administered 2018-10-15 – 2018-10-16 (×2): 325 mg via ORAL
  Filled 2018-10-14 (×3): qty 1

## 2018-10-14 MED ORDER — EPOETIN ALFA 20000 UNIT/ML IJ SOLN
INTRAMUSCULAR | Status: AC
  Filled 2018-10-14: qty 1

## 2018-10-14 MED ORDER — ASPIRIN 325 MG PO TABS
325.0000 mg | ORAL_TABLET | Freq: Once | ORAL | Status: AC
  Administered 2018-10-14: 325 mg via ORAL
  Filled 2018-10-14: qty 1

## 2018-10-14 MED ORDER — ONDANSETRON HCL 4 MG/2ML IJ SOLN: 4.0000 mg | Freq: Four times a day (QID) | INTRAMUSCULAR | Status: DC | PRN

## 2018-10-14 MED ORDER — NITROGLYCERIN 0.4 MG SL SUBL
0.4000 mg | SUBLINGUAL_TABLET | SUBLINGUAL | Status: DC | PRN
  Administered 2018-10-15: 08:00:00 0.4 mg via SUBLINGUAL
  Filled 2018-10-14 (×2): qty 1

## 2018-10-14 MED ORDER — PANTOPRAZOLE SODIUM 40 MG PO TBEC
40.0000 mg | DELAYED_RELEASE_TABLET | Freq: Every day | ORAL | Status: DC
  Administered 2018-10-14 – 2018-10-16 (×3): 40 mg via ORAL
  Filled 2018-10-14 (×3): qty 1

## 2018-10-14 MED ORDER — SODIUM CHLORIDE 0.9 % IV SOLN: 250.0000 mL | INTRAVENOUS | Status: DC | PRN

## 2018-10-14 MED ORDER — INSULIN ASPART 100 UNIT/ML ~~LOC~~ SOLN
0.0000 [IU] | SUBCUTANEOUS | Status: DC
  Administered 2018-10-15: 2 [IU] via SUBCUTANEOUS
  Administered 2018-10-15: 1 [IU] via SUBCUTANEOUS
  Filled 2018-10-14: qty 0.09

## 2018-10-14 MED ORDER — ALBUTEROL SULFATE HFA 108 (90 BASE) MCG/ACT IN AERS
6.0000 | INHALATION_SPRAY | Freq: Once | RESPIRATORY_TRACT | Status: AC
  Administered 2018-10-14: 6 via RESPIRATORY_TRACT
  Filled 2018-10-14: qty 6.7

## 2018-10-14 MED ORDER — VITAMIN C 500 MG PO TABS
250.0000 mg | ORAL_TABLET | Freq: Every day | ORAL | Status: DC
  Administered 2018-10-14 – 2018-10-16 (×3): 250 mg via ORAL
  Filled 2018-10-14 (×3): qty 1

## 2018-10-14 MED ORDER — HEPARIN BOLUS VIA INFUSION
3000.0000 [IU] | Freq: Once | INTRAVENOUS | Status: AC
  Administered 2018-10-14: 3000 [IU] via INTRAVENOUS
  Filled 2018-10-14: qty 3000

## 2018-10-14 MED ORDER — EPOETIN ALFA 20000 UNIT/ML IJ SOLN
60000.0000 [IU] | Freq: Once | INTRAMUSCULAR | Status: AC
  Administered 2018-10-14: 60000 [IU] via SUBCUTANEOUS

## 2018-10-14 MED ORDER — TRIMETHOPRIM 100 MG PO TABS
100.0000 mg | ORAL_TABLET | Freq: Every day | ORAL | Status: DC
  Administered 2018-10-14 – 2018-10-15 (×2): 100 mg via ORAL
  Filled 2018-10-14 (×2): qty 1

## 2018-10-14 MED ORDER — SODIUM CHLORIDE 0.9% FLUSH
3.0000 mL | Freq: Two times a day (BID) | INTRAVENOUS | Status: DC
  Administered 2018-10-14 – 2018-10-16 (×2): 3 mL via INTRAVENOUS

## 2018-10-14 MED ORDER — VITAMIN B-12 1000 MCG PO TABS
1000.0000 ug | ORAL_TABLET | Freq: Every day | ORAL | Status: DC
  Administered 2018-10-14 – 2018-10-16 (×3): 1000 ug via ORAL
  Filled 2018-10-14 (×3): qty 1

## 2018-10-14 MED ORDER — ROSUVASTATIN CALCIUM 5 MG PO TABS
5.0000 mg | ORAL_TABLET | Freq: Every day | ORAL | Status: DC
  Administered 2018-10-14 – 2018-10-16 (×3): 5 mg via ORAL
  Filled 2018-10-14 (×3): qty 1

## 2018-10-14 MED ORDER — ACETAMINOPHEN 325 MG PO TABS: 650.0000 mg | ORAL_TABLET | ORAL | Status: DC | PRN

## 2018-10-14 NOTE — Telephone Encounter (Signed)
Spoke with the pt's daughter [name]  She states that [facility] is [facility] and getting ready to be transferred to ED bc he is in acute distress after refusing to take his lasix  She will make appt once he is discharged  She states that she is still waiting on TP to call her to discuss possible palliative care and wants her to know that pt is being taken to ED  Will forward to TP to make her aware

## 2018-10-14 NOTE — ED Notes (Signed)
ED Provider at bedside. 

## 2018-10-14 NOTE — Progress Notes (Signed)
Pt transferred to [facility] ED room 15 via w/c with belongings and home oxygen therapy tank.  Daughter [name] has been contacted and is on the way.  Report given bedside by PA Lucianne Lei and Learta Codding RN to [employer].

## 2018-10-14 NOTE — Patient Instructions (Signed)

## 2018-10-14 NOTE — ED Notes (Signed)
Date and time results received: 10/14/18 1557 (use smartphrase ".now" to insert current time)  Test: troponin Critical Value: 1048  Name of Provider Notified: Dr. Lita Mains  Orders Received? Or Actions Taken?: Plan of care continued, hospitalist notified.

## 2018-10-14 NOTE — Progress Notes (Addendum)
Xcover Pt w critical troponin,  Not currently having chest pain.  Pt admitted for CHF.  Pt received aspirin 325mg  po x1 in ED  Exam:  T 97.8 P136 initially, currently 93, Bp 102/74  Pox 93% on Bipap Heent: anicteric Neck: + jvd Heart: rrr s1, s2,  Lung: + bibasilar crackles Abd: soft, nt, nd, +bs Ext: no c/c 1+ edema  A/P Elevated troponin Repeat 12 lead EKG Check hga1c, lipid in AM Check trop I in AM Add CPK, MB Cardiac echo already ordered Start aspirin 325mg  po qday in am Cont Crestor 5mg  po qhs Pt typically on metoprolol, appears to be held/ not ordered due to acute CHF and low bp Cont Heparin gtt Cardiology input appreciated,   H/o Dm2 per PMHx in EPIC, last Hga1c =5.6 in computer Doesn't appear to be on medications.  fsbs q4h, ISS

## 2018-10-14 NOTE — Progress Notes (Signed)
[facility] Progress Note   [name] [name], [name] [name] [id number] 07/22/1925 85 y.o.  [name] [name], [name] [name] is managed by This patient was seen with Dr. [name] with my treatment plan reviewed with him. He expressed agreement with my medical management of this patient.  Actively treated with chemotherapy/immunotherapy/hormonal therapy: yes  Current therapy: [name] and prednisone  Next scheduled appointment with provider:  10/29/2018  Assessment: Plan:    Acute on chronic respiratory failure with hypoxia ([place])  COPD with acute exacerbation (H[facility])  Chronic diastolic CHF (congestive heart failure) (HCC)  Multiple myeloma not having achieved remission (HCC)  Iron deficiency anemia secondary to inadequate dietary iron intake   History of acute respiratory failure, COPD, chronic diastolic congestive heart failure, and anemia in a setting of increased shortness of breath: A CBC returned today showing a hemoglobin of 9.3.  A BNP from 2 weeks prior returned elevated at 1073.2.  The patient was seen with Dr. [name].  He was transported to the emergency room for evaluation and management.  Dr. [name] spoke with the patient and told him that he does not believe his shortness of breath was related to anemia and was most certainly not related to his multiple myeloma or his treatment thereof.  Multiple myeloma: The patient has been treated most recently with melphalan and prednisone.  He is scheduled to see Dr. [name] and return on 10/29/2018.  Please see After Visit Summary for patient specific instructions.  Future Appointments  Date Time Provider [place]  10/29/2018 11:30 AM [facility] LAB 3 [facility] None  10/29/2018 12:00 PM Ladell Pier, [name] [name] [facility] None  10/29/2018 12:30 PM [facility] INFUSION [facility] None  11/23/2018  7:35 AM [facility] DEVICE REMOTES [facility]  02/22/2019  7:35 AM [facility] DEVICE REMOTES [facility]  05/24/2019  7:35 AM [facility] DEVICE REMOTES [facility]  08/23/2019  7:35 AM [facility] DEVICE REMOTES [facility]  11/22/2019  7:35 AM [facility] DEVICE REMOTES [facility]    No orders of the defined types were placed in this encounter.      Subjective:   Patient ID:  [name] [name], [name] [name] is a 85 y.o. (DOB 05-27-1925) male.  Chief Complaint:  Chief Complaint  Patient presents with  . Shortness of Breath    HPI [name] [name], [name] [name]   is a 85 year old male with a history of multiple myeloma and anemia.  He is treated by Dr. Dominica Severin B. Sherrill and is most recently been treated with melphalan and prednisone.  He has had recurrent transfusions.  A CBC today returned at 9.3.  The patient presented today with significant shortness of breath.  He has a history of chronic diastolic congestive heart failure, COPD, and acute respiratory failure.  He has a prescription for Lasix 20 mg once daily but has not been taking this as he has to self catheterize.  He reports that he gets a urinary tract infection often after catheterizing himself.  He has bilateral lower extremity edema.  Medications: I have reviewed the patient's current medications.  Allergies: No Known Allergies  Past Medical History:  Diagnosis Date  . Anemia, unspecified   . CAD (coronary artery disease)   . Cataracts, bilateral   . CHF (congestive heart failure) ([place])   . Chronic airway obstruction, not elsewhere classified    on o2 at night  . Diabetes mellitus   . Diverticulosis of colon (without mention of hemorrhage)   . Dyslipidemia   . Emphysema   . Hemorrhoids   . ICD (  implantable cardiac defibrillator) in place    st jude  . Ischemic cardiomyopathy   . Macular degeneration   . MI, old 2010 or 2011  . Prostatic hypertrophy    hx of  . Retention of urine, unspecified   . Unspecified disorder resulting from impaired renal function     Past Surgical  History:  Procedure Laterality Date  . CARDIAC DEFIBRILLATOR PLACEMENT  2011   st jude  . CATARACT EXTRACTION    . PACEMAKER INSERTION  2011  . pci     4/11  . TONSILLECTOMY  85 yo    Family History  Problem Relation Age of Onset  . Coronary artery disease Mother   . Stroke Mother   . Congestive Heart Failure Mother   . Lung cancer Father        smoker  . Breast cancer Sister   . Atrial fibrillation Daughter   . Heart attack Brother     Social History   Socioeconomic History  . Marital status: Widowed    Spouse name: Not on file  . Number of children: Not on file  . Years of education: Not on file  . Highest education level: Not on file  Occupational History  . 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 Financial resource strain: Not on file  . Food insecurity    Worry: Not on file    Inability: Not on file  . Transportation needs    Medical: Not on file    Non-medical: Not on file  Tobacco Use  . Smoking status: Former Smoker    Types: Cigarettes    Quit date: 04/27/2009    Years since quitting: 9.4  . Smokeless tobacco: Never Used  Substance and Sexual Activity  . Alcohol use: Yes    Comment: occasional beer  . Drug use: No  . Sexual activity: Never  Lifestyle  . Physical activity    Days per week: Not on file    Minutes per session: Not on file  . Stress: Not on file  Relationships  . Social Herbalist on phone: Not on file    Gets together: Not on file    Attends religious service: Not on file    Active member of club or organization: Not on file    Attends meetings of clubs or organizations: Not on file    Relationship status: Not on file  . Intimate partner violence    Fear of current or ex partner: Not on file    Emotionally abused: Not on file    Physically abused: Not on file    Forced sexual activity: Not on file  Other Topics Concern  . Not on file  Social History Narrative   Lives in a house with  stairs, which he needs to use, with his wife.  Wife is not able-bodied.  Does not use a cane or walker, but is unsteady on his feet.        [name] Shippy:  [phone] cell, [phone], daughter, POA.     Boyd Kerbs:  [phone], nephew   Juluis Pitch:  [phone], daughter          Past Medical History, Surgical history, Social history, and Family history were reviewed and updated as appropriate.   Please see review of systems for further details on the patient's review from today.   Review of Systems:  Review of Systems  Constitutional: Positive for fatigue. Negative  for appetite change, chills, diaphoresis and fever.  HENT: Positive for hearing loss. Negative for dental problem, mouth sores and trouble swallowing.   Respiratory: Positive for shortness of breath. Negative for cough and chest tightness.   Cardiovascular: Positive for leg swelling. Negative for chest pain and palpitations.  Gastrointestinal: Negative for constipation, diarrhea, nausea and vomiting.  Neurological: Positive for weakness. Negative for dizziness, syncope and headaches.    Objective:   Physical Exam:  BP 138/72   Pulse 100 Comment: PA Zuly Belkin aware of all new VS  Resp (!) 24   SpO2 (S) 100% Comment: 10 L NRB ECOG: 2  Physical Exam Constitutional:      Appearance: He is not diaphoretic.     Comments: The patient is an elderly male who appears to be tachypneic and is receiving oxygen via nasal cannula at 3 L/min.  HENT:     Head: Normocephalic and atraumatic.  Cardiovascular:     Rate and Rhythm: Regular rhythm. Tachycardia present.  Pulmonary:     Effort: Tachypnea present.     Breath sounds: Examination of the right-middle field reveals rhonchi. Examination of the left-middle field reveals rhonchi. Examination of the right-lower field reveals decreased breath sounds. Examination of the left-lower field reveals decreased breath sounds. Decreased breath sounds and rhonchi present.  Chest:      Chest wall: There is no dullness to percussion.  Musculoskeletal:     Right lower leg: Edema present.     Left lower leg: Edema present.     Comments: Trace presacral edema.    This patient was seen with Dr. [name] with my treatment plan reviewed with him. He expressed agreement with my medical management of this patient.   Lab Review:     Component Value Date/Time   NA 139 10/14/2018 1305   NA 146 (H) 12/02/2017 1553   NA 140 01/12/2017 1538   K 5.3 (H) 10/14/2018 1305   K 4.4 01/12/2017 1538   CL 103 10/14/2018 1305   CO2 24 10/14/2018 1305   CO2 27 01/12/2017 1538   GLUCOSE 145 (H) 10/14/2018 1305   GLUCOSE 84 01/12/2017 1538   BUN 67 (H) 10/14/2018 1305   BUN 30 12/02/2017 1553   BUN 23.7 01/12/2017 1538   CREATININE 1.72 (H) 10/14/2018 1305   CREATININE 1.16 09/20/2018 1506   CREATININE 1.0 01/12/2017 1538   CALCIUM 9.0 10/14/2018 1305   CALCIUM 8.7 01/12/2017 1538   PROT 6.4 (L) 10/14/2018 1305   PROT 5.7 (L) 01/12/2017 1538   PROT 6.1 (L) 01/12/2017 1538   ALBUMIN 3.6 10/14/2018 1305   ALBUMIN 2.9 (L) 01/12/2017 1538   AST 27 10/14/2018 1305   AST 12 (L) 09/20/2018 1506   AST 11 01/12/2017 1538   ALT 30 10/14/2018 1305   ALT 26 09/20/2018 1506   ALT 12 01/12/2017 1538   ALKPHOS 48 10/14/2018 1305   ALKPHOS 49 01/12/2017 1538   BILITOT 1.0 10/14/2018 1305   BILITOT 0.5 09/20/2018 1506   BILITOT 0.47 01/12/2017 1538   GFRNONAA 34 (L) 10/14/2018 1305   GFRNONAA 55 (L) 09/20/2018 1506   GFRAA 39 (L) 10/14/2018 1305   GFRAA >60 09/20/2018 1506       Component Value Date/Time   WBC 5.2 10/14/2018 1305   RBC 3.74 (L) 10/14/2018 1305   HGB 9.9 (L) 10/14/2018 1305   HGB 9.3 (L) 10/14/2018 0932   HGB 7.4 (L) 01/12/2017 1538   HCT 32.2 (L) 10/14/2018 1305   HCT 23.8 (L)  01/12/2017 1538   PLT 82 (L) 10/14/2018 1305   PLT 71 (L) 10/14/2018 0932   PLT 89 (L) 01/12/2017 1538   MCV 86.1 10/14/2018 1305   MCV 74.3 (L) 01/12/2017 1538   MCH 26.5 10/14/2018  1305   MCHC 30.7 10/14/2018 1305   RDW 17.7 (H) 10/14/2018 1305   RDW 21.0 (H) 01/12/2017 1538   LYMPHSABS 0.3 (L) 10/14/2018 1305   LYMPHSABS 0.6 (L) 01/12/2017 1538   MONOABS 0.3 10/14/2018 1305   MONOABS 0.3 01/12/2017 1538   EOSABS 0.0 10/14/2018 1305   EOSABS 0.1 01/12/2017 1538   BASOSABS 0.0 10/14/2018 1305   BASOSABS 0.1 01/12/2017 1538   -------------------------------  Imaging from last 24 hours (if applicable):  Radiology interpretation: Dg Chest 2 View  Result Date: 09/15/2018 CLINICAL DATA:  Cough, shortness of breath EXAM: CHEST - 2 VIEW COMPARISON:  07/28/2018 FINDINGS: Stable positioning of left-sided implanted cardiac device. Heart size is normal. Aorta is calcified and tortuous. Lungs are hyperexpanded with flattening of the diaphragms and coarsened interstitial markings compatible with chronic emphysema/COPD. No superimposed airspace consolidation. No pleural effusion or pneumothorax. IMPRESSION: 1. No acute cardiopulmonary findings. 2. Stigmata of emphysema. Electronically Signed   By: Davina Poke M.D.   On: 09/15/2018 12:44   Ct Angio Chest W/cm &/or Wo Cm  Result Date: 09/27/2018 CLINICAL DATA:  Shortness of breath and chest pain EXAM: CT ANGIOGRAPHY CHEST WITH CONTRAST TECHNIQUE: Multidetector CT imaging of the chest was performed using the standard protocol during bolus administration of intravenous contrast. Multiplanar CT image reconstructions and MIPs were obtained to evaluate the vascular anatomy. CONTRAST:  177m OMNIPAQUE IOHEXOL 350 MG/ML SOLN COMPARISON:  09/15/2018 plain film FINDINGS: Cardiovascular: Atherosclerotic calcifications of the thoracic aorta and its branches are seen. No aneurysmal dilatation is noted. Opacification is suboptimal to evaluate for dissection. Coronary calcifications are seen. The pulmonary artery shows a normal branching pattern. No findings to suggest pulmonary emboli are identified. Mediastinum/Nodes: Thoracic inlet is within  normal limits. No sizable hilar or mediastinal adenopathy is noted. The esophagus is within normal limits. Lungs/Pleura: Small bilateral pleural effusions are noted. Emphysematous changes are seen. Mild atelectatic changes are noted in the lingula. Mild scarring is noted in the right middle lobe inferiorly. No focal parenchymal nodule is seen. Mild dependent atelectatic changes are seen. Upper Abdomen: Visualized upper abdomen shows cystic changes within the kidneys but incompletely evaluated. Musculoskeletal: Degenerative changes of the thoracic spine are noted. No acute rib abnormality is noted. No compression deformities are seen. Review of the MIP images confirms the above findings. IMPRESSION: No evidence of pulmonary emboli. Small bilateral pleural effusions. Emphysematous changes with mild dependent basilar atelectasis. Density is noted in the lingula posteriorly likely related atelectasis. Aortic Atherosclerosis (ICD10-I70.0) and Emphysema (ICD10-J43.9). Electronically Signed   By: MInez CatalinaM.D.   On: 09/27/2018 15:58   Dg Chest Portable 1 View  Result Date: 10/14/2018 CLINICAL DATA:  Increasing shortness of breath. EXAM: PORTABLE CHEST 1 VIEW COMPARISON:  September 15, 2018 FINDINGS: Stable position of cardiac pacemaker. Cardiomediastinal silhouette is normal. Mediastinal contours appear intact. Tortuosity and calcific atherosclerotic disease of the aorta. Bilateral layering pleural effusions with mild interstitial pulmonary edema. Osseous structures are without acute abnormality. Soft tissues are grossly normal. IMPRESSION: Bilateral layering pleural effusions with mild interstitial pulmonary edema. Electronically Signed   By: DFidela SalisburyM.D.   On: 10/14/2018 13:46     Dr. BJudene Companionwas interviewed and examined.  He presents today with increased respiratory distress.  He has leg edema.  I suspect the dyspnea is related to emphysema and heart disease.  The dyspnea does not appear related to  anemia or multiple myeloma.  He was referred to the emergency room for further evaluation. Julieanne Manson, [name] [name]

## 2018-10-14 NOTE — ED Notes (Signed)
Date and time results received: 10/14/18 1904 (use smartphrase ".now" to insert current time)  Test: troponin Critical Value: 3613  Name of Provider Notified:  Orders Received? Or Actions Taken?: waiting on orders

## 2018-10-14 NOTE — ED Provider Notes (Signed)
Emergency Department Provider Note   I have reviewed the triage vital signs and the nursing notes.   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  He arrived to the cancer center where he is being seen for anemia and transfusions.  He was found to be short of breath on arrival there and was redirected to the emergency department.  Patient tells me that he is having a hard time breathing but because of difficulty hearing and significant shortness of breath he is unable to elaborate.  He denies chest pain. Level 5 caveat applies. Family notified by [facility] staff and apparently en route to the ED.    Past Medical History:  Diagnosis Date  . Anemia, unspecified   . CAD (coronary artery disease)   . Cataracts, bilateral   . CHF (congestive heart failure) ([place])   . Chronic airway obstruction, not elsewhere classified    on o2 at night  . Diabetes mellitus   . Diverticulosis of colon (without mention of hemorrhage)   . Dyslipidemia   . Emphysema   . Hemorrhoids   . ICD (implantable cardiac defibrillator) in place    st jude  . Ischemic cardiomyopathy   . Macular degeneration   . MI, old 2010 or 2011  . Prostatic hypertrophy    hx of  . Retention of urine, unspecified   . Unspecified disorder resulting from impaired renal function     Patient Active Problem List   Diagnosis Date Noted  . Goals of care, counseling/discussion 01/01/2018  . Influenza-like illness   . Neutropenia with fever ([place])   . Pulmonary emphysema ([place]) 02/01/2017  . Multiple myeloma ([place]) 02/01/2017  . Prostatic hypertrophy   . MI, old   . Macular degeneration   . Hemorrhoids   . Dyslipidemia   . Cataracts, bilateral   . CAD (coronary artery disease)   .  Cough   . Pancytopenia ([place])   . Acute respiratory failure with hypoxemia ([place]) 11/27/2016  . Physical deconditioning   . Post-nasal drip   . Acute on chronic respiratory failure with hypoxia ([place]) 11/10/2016  . Acute renal failure superimposed on chronic kidney disease ([place]) 10/29/2016  . CAP (community acquired pneumonia) 10/29/2016  . Acute on chronic respiratory acidosis 10/29/2016  . Lobar pneumonia ([place])   . Chronic diastolic CHF (congestive heart failure) ([place])   . Chronic respiratory failure with hypoxia ([place]) 10/24/2016  . Presbyesophagus   . Acute on chronic respiratory failure ([place])   . Microcytic anemia   . Dysphagia   . Cardiomyopathy, ischemic   . Sepsis ([place]) 06/11/2015  . COPD with exacerbation ([place]) 11/21/2013  . Diverticulitis of colon with perforation 11/21/2013  . Severe sepsis ([place]) 11/21/2013  . Adynamic ileus ([place]) 11/21/2013  . COPD with acute exacerbation ([place]) 11/21/2013  . UTI (urinary tract infection), bacterial 11/21/2013  . Severe protein-calorie malnutrition ([place]) 11/21/2013  . Hypernatremia 11/17/2013  . Preop pulmonary/respiratory exam 11/15/2013  . Preoperative respiratory examination 11/14/2013  . AKI (acute kidney injury) ([place]) 07/01/2013  . Low back pain 07/01/2013  . UTI (urinary tract infection) 07/01/2013  . Chronic systolic HF (heart failure) ([place]) 07/01/2013  . Dysphagia, unspecified(787.20) 10/27/2012  . Acute on chronic renal insufficiency 03/12/2012  . MGUS (monoclonal gammopathy of  unknown significance) 03/12/2012  . Thrombocytopenia ([place]) 03/12/2012  . Beta thalassemia trait 03/12/2012  . UTI (lower urinary tract infection) 03/12/2012  . Gait instability 03/12/2012  . Fall at home 03/12/2012  . Automatic implantable cardioverter-defibrillator in situ 12/09/2011  . Ischemic cardiomyopathy 04/14/2011  . Cyanocobalamin deficiency 12/19/2010  . CHF (congestive heart failure) ([place]) 10/09/2010  . Edema 07/08/2010  . Dyspnea  07/08/2010  . CARDIAC ARREST 04/04/2010  . CONDUCTIVE HEARING LOSS OF COMBINED TYPES 10/24/2009  . KNEE PAIN 08/07/2009  . MI 06/07/2009  . DIABETES MELLITUS, TYPE II 06/06/2009  . DYSLIPIDEMIA 06/06/2009  . Anemia 06/06/2009  . Coronary atherosclerosis 06/06/2009  . Asthma vs vcd 06/06/2009  . DIVERTICULAR DISEASE 06/06/2009  . Chronic kidney disease, stage 3 ([place]) 06/06/2009  . URINARY RETENTION 06/06/2009  . BENIGN PROSTATIC HYPERTROPHY, HX OF 06/06/2009    Past Surgical History:  Procedure Laterality Date  . CARDIAC DEFIBRILLATOR PLACEMENT  2011   st jude  . CATARACT EXTRACTION    . PACEMAKER INSERTION  2011  . pci     4/11  . TONSILLECTOMY  85 yo    Allergies Patient has no known allergies.  Family History  Problem Relation Age of Onset  . Coronary artery disease Mother   . Stroke Mother   . Congestive Heart Failure Mother   . Lung cancer Father        smoker  . Breast cancer Sister   . Atrial fibrillation Daughter   . Heart attack Brother     Social History Social History   Tobacco Use  . Smoking status: Former Smoker    Types: Cigarettes    Quit date: 04/27/2009    Years since quitting: 9.4  . Smokeless tobacco: Never Used  Substance Use Topics  . Alcohol use: Yes    Comment: occasional beer  . Drug use: No    Review of Systems  Level 5 caveat: Difficulty hearing and acute respiratory distress.   ____________________________________________   PHYSICAL EXAM:  VITAL SIGNS: ED Triage Vitals  Enc Vitals Group     BP 10/14/18 1225 (!) 113/93     Pulse Rate 10/14/18 1225 (!) 107     Resp 10/14/18 1225 (S) (!) 27     Temp --      Temp Source 10/14/18 1225 Oral     SpO2 10/14/18 1225 94 %     Weight 10/14/18 1226 143 lb 4.8 oz (65 kg)   Constitutional: Alert, sitting upright with significant SOB and visible retractions.  Eyes: Conjunctivae are normal. Head: Atraumatic. Nose: No congestion/rhinnorhea. Mouth/Throat: Mucous membranes are  moist.  Neck: No stridor.  Cardiovascular: Tachycardia. Good peripheral circulation. Grossly normal heart sounds.   Respiratory: Significant increased respiratory effort. Positive retractions. Lungs with end-expiratory wheezing bilaterally and diminished at the bases.  Gastrointestinal: Soft and nontender. No distention.  Musculoskeletal: No lower extremity tenderness with 3+ pitting edema in the bilateral LEs.  Neurologic:  Normal speech and language. No gross focal neurologic deficits are appreciated.  Skin:  Skin is warm, dry and intact. No rash noted.  ____________________________________________   LABS (all labs ordered are listed, but only abnormal results are displayed)  Labs Reviewed  COMPREHENSIVE METABOLIC PANEL - Abnormal; Notable for the following components:      Result Value   Potassium 5.3 (*)    Glucose, Bld 145 (*)    BUN 67 (*)    Creatinine, Ser 1.72 (*)    Total Protein 6.4 (*)  GFR calc non Af Amer 34 (*)    GFR calc Af Amer 39 (*)    All other components within normal limits  CBC WITH DIFFERENTIAL/PLATELET - Abnormal; Notable for the following components:   RBC 3.74 (*)    Hemoglobin 9.9 (*)    HCT 32.2 (*)    RDW 17.7 (*)    Platelets 82 (*)    nRBC 1.5 (*)    Lymphs Abs 0.3 (*)    All other components within normal limits  BRAIN NATRIURETIC PEPTIDE - Abnormal; Notable for the following components:   B Natriuretic Peptide 1,397.7 (*)    All other components within normal limits  TROPONIN I (HIGH SENSITIVITY) - Abnormal; Notable for the following components:   Troponin I (High Sensitivity) 236 (*)    All other components within normal limits  SARS CORONAVIRUS 2 (HOSPITAL ORDER, [facility])  TROPONIN I (HIGH SENSITIVITY)   ____________________________________________  EKG   EKG Interpretation  Date/Time:  Thursday October 14 2018 12:29:56 EDT Ventricular Rate:  99 PR Interval:    QRS Duration: 105 QT Interval:   315 QTC Calculation: 405 R Axis:   30 Text Interpretation:  Sinus tachycardia Multiple premature complexes, vent & supraven Abnormal R-wave progression, early transition Nonspecific repol abnormality, diffuse leads Minimal ST elevation, lateral leads Confirmed by Nanda Quinton [phone]) on 10/14/2018 12:38:29 PM       ____________________________________________  RADIOLOGY  Dg Chest Portable 1 View  Result Date: 10/14/2018 CLINICAL DATA:  Increasing shortness of breath. EXAM: PORTABLE CHEST 1 VIEW COMPARISON:  September 15, 2018 FINDINGS: Stable position of cardiac pacemaker. Cardiomediastinal silhouette is normal. Mediastinal contours appear intact. Tortuosity and calcific atherosclerotic disease of the aorta. Bilateral layering pleural effusions with mild interstitial pulmonary edema. Osseous structures are without acute abnormality. Soft tissues are grossly normal. IMPRESSION: Bilateral layering pleural effusions with mild interstitial pulmonary edema. Electronically Signed   By: Fidela Salisbury M.D.   On: 10/14/2018 13:46    ____________________________________________   PROCEDURES  Procedure(s) performed:   Procedures  CRITICAL CARE Performed by: Margette Fast Total critical care time: 35 minutes Critical care time was exclusive of separately billable procedures and treating other patients. Critical care was necessary to treat or prevent imminent or life-threatening deterioration. Critical care was time spent personally by me on the following activities: development of treatment plan with patient and/or surrogate as well as nursing, discussions with consultants, evaluation of patient's response to treatment, examination of patient, obtaining history from patient or surrogate, ordering and performing treatments and interventions, ordering and review of laboratory studies, ordering and review of radiographic studies, pulse oximetry and re-evaluation of patient's condition.  Nanda Quinton, MD Emergency Medicine  ____________________________________________   INITIAL IMPRESSION / ASSESSMENT AND PLAN / ED COURSE  Pertinent labs & imaging results that were available during my care of the patient were reviewed by me and considered in my medical decision making (see chart for details).   Patient arrives to the emergency department with increasing shortness of breath, lower extremity edema, wheezing and rales at the bases on exam.  Patient is sitting upright, with retractions, appears to be in acute respiratory distress.  He was transitioned to nasal cannula oxygen.  Afebrile here.  Low suspicion for COVID.  Plan for immediate BiPAP, albuterol inhaler, and Lasix.  Labs pending.   12:45 PM  On reevaluation, patient's breathing has improved while on oxygen.  His work of breathing is not as severe and appears  to be able to rest somewhat. Remains awake and alert/interactive.  We will hold on immediate BiPAP for now and follow COVID testing and additional lab work.   Spoke with daughter by phone. States that her father does not take his lasix because of excessive urination. When his Hb labs were normal she thought it was probably fluid related. BNP here is 1397. Troponin elevated to 236 which is suspect is demand ischemia from fluid overload. Patient on NRB for now with episode of hypoxemia since arrival in the ED. Will transition to BiPAP once COVID results. Patient tells me that he WOULD want to be intubated should his symptoms worsen despite his DNR status on past admits. Daughter wants to respect his wishes. Patient appears to be clinically stable to slightly improving here. No indication for intubation at this time.   Code Status: FULL CODE  Discussed patient's case with TR[facility] to request admission. Patient and family (if present) updated with plan. Care transferred to [facility] service.  I reviewed all nursing notes, vitals, pertinent old records, EKGs, labs, imaging (as available).   ____________________________________________  FINAL CLINICAL IMPRESSION(S) / ED DIAGNOSES  Final diagnoses:  Acute respiratory failure with hypoxia (HCC)  Acute pulmonary edema (HCC)    MEDICATIONS GIVEN DURING THIS VISIT:  Medications  furosemide (LASIX) injection 60 mg (60 mg Intravenous Given 10/14/18 1257)  albuterol (VENTOLIN HFA) 108 (90 Base) MCG/ACT inhaler 6 puff (6 puffs Inhalation Given 10/14/18 1259)    Note:  This document was prepared using Dragon voice recognition software and may include unintentional dictation errors.  Nanda Quinton, MD, [facility] Emergency Medicine    Long, Wonda Olds, MD 10/15/18 [phone]

## 2018-10-14 NOTE — Progress Notes (Addendum)
Trop now 3600.  Admit request already changed to progressive at [facility], flow management aware.  Spoke with Dr. Marisue Ivan ([facility] at [facility]): 1) Patient already got ASA, on heparin gtt 2) repeat EKG now (I let RN know) to r/o dynamic ST changes (No STEMI on EKG earlier this evening). 3) Likely will need LHC in AM, sooner if:  A) intractable chest pain  B) hypotension or hemodynamic instability 4) He will let cards night team know 5) Patient already got dose of lasix which he wanted to make sure patient received.  BP is borderline ([id number] systolic), so Im gonna hold his beta blocker dose he was to get later tonight.  Doesn't look like any other BP meds have been ordered.  Update: Patient continues to deny chest pain.  Update 2: spoke with pt and family at bedside, re-verified code status instructions: 1) BIPAP okay 2) If heart stops, no CPR, no shocks, let him go 3) No intubation he now tells me. 4) May use pressors if needed for low blood pressure.  CRITICAL CARE Performed by: Etta Quill.   Total critical care time: 30 minutes  Critical care time was exclusive of separately billable procedures and treating other patients.  Critical care was necessary to treat or prevent imminent or life-threatening deterioration.  Critical care was time spent personally by me on the following activities: development of treatment plan with patient and/or surrogate as well as nursing, discussions with consultants, evaluation of patient's response to treatment, examination of patient, obtaining history from patient or surrogate, ordering and performing treatments and interventions, ordering and review of laboratory studies, ordering and review of radiographic studies, pulse oximetry and re-evaluation of patient's condition.

## 2018-10-14 NOTE — Progress Notes (Addendum)
Per MD [name] pt will not receive blood transfusion today, just procrit injection.  Both [facility] RN and RN Manuela Schwartz attempted to explain this and that per MD [name] the pt needed to f/u with his pulmonologist or visit the ED for his resp symptoms as they cannot be managed here.  Pt states repeatedly that he does not understand or agree with the plan of care and wishes to speak with Dr. [name].  Pt consented to receive procrit injection while waiting for MD Sherrill.  Pt presents today with reported inc SOB/DOE and fatigue.  Daughter spoke to [place] when they arrived for visit, reports that pt is not taking his Lasix and wishes for pt to be evaluated today for continued resp issues.

## 2018-10-14 NOTE — Progress Notes (Signed)
ANTICOAGULATION CONSULT NOTE - Initial Consult  Pharmacy Consult for IV UFH Indication: chest pain/ACS  No Known Allergies  Patient Measurements: Weight: 143 lb 4.8 oz (65 kg) Heparin Dosing Weight: 65  Vital Signs: Temp: 97.8 F (36.6 C) (09/17 1012) Temp Source: Oral (09/17 1225) BP: 121/90 (09/17 1645) Pulse Rate: 88 (09/17 1645)  Labs: Recent Labs    10/14/18 0932 10/14/18 1305 10/14/18 1437  HGB 9.3* 9.9*  --   HCT 29.7* 32.2*  --   PLT 71* 82*  --   CREATININE  --  1.72*  --   TROPONINIHS  --  236* 1,048*    Estimated Creatinine Clearance: 25.7 mL/min (A) (by C-G formula based on SCr of 1.72 mg/dL (H)).   Medical History: Past Medical History:  Diagnosis Date  . Anemia, unspecified   . CAD (coronary artery disease)   . Cataracts, bilateral   . CHF (congestive heart failure) ([place])   . Chronic airway obstruction, not elsewhere classified    on o2 at night  . Diabetes mellitus   . Diverticulosis of colon (without mention of hemorrhage)   . Dyslipidemia   . Emphysema   . Hemorrhoids   . ICD (implantable cardiac defibrillator) in place    st jude  . Ischemic cardiomyopathy   . Macular degeneration   . MI, old 2010 or 2011  . Prostatic hypertrophy    hx of  . Retention of urine, unspecified   . Unspecified disorder resulting from impaired renal function     Medications:  Scheduled:  . aspirin  325 mg Oral Once   Infusions:    Assessment: 85 yo male presented to ED from [facility] with shortness of breath, lower Hgb and plts, possible CHF exacerbation. Noted that patient was to receive treatment for his MM today but was taken to ED instead due to above. To start IV heparin for possible ACS now. Baseline labs drawn. Patient not on anticoagulation medications prior to admission.   Goal of Therapy:  Heparin level 0.3-0.7 units/ml Monitor platelets by anticoagulation protocol: Yes   Plan:   IV heparin 3000 unit bolus then  IV heparin 800 units/hr  infusion rate  Check heparin level 8 hours after start of heparin  Daily CBC and heparin level  Kara Mead 10/14/2018,5:38 PM

## 2018-10-14 NOTE — Telephone Encounter (Signed)
I will be opening up office in October but seems like this patient needs to be seen before that. Please have him see APP ASAP

## 2018-10-14 NOTE — ED Notes (Signed)
Date and time results received: 10/14/18 2229 (use smartphrase ".now" to insert current time)  Test: troponin Critical Value: [place]  Name of Provider Notified:   Orders Received? Or Actions Taken?:waiting on orders

## 2018-10-14 NOTE — Patient Instructions (Signed)
COVID-19: How to Protect Yourself and Others Know how it spreads  There is currently no vaccine to prevent coronavirus disease 2019 (COVID-19).  The best way to prevent illness is to avoid being exposed to this virus.  The virus is thought to spread mainly from person-to-person. ? Between people who are in close contact with one another (within about 6 feet). ? Through respiratory droplets produced when an infected person coughs, sneezes or talks. ? These droplets can land in the mouths or noses of people who are nearby or possibly be inhaled into the lungs. ? Some recent studies have suggested that COVID-19 may be spread by people who are not showing symptoms. Everyone should Clean your hands often  Wash your hands often with soap and water for at least 20 seconds especially after you have been in a public place, or after blowing your nose, coughing, or sneezing.  If soap and water are not readily available, use a hand sanitizer that contains at least 60% alcohol. Cover all surfaces of your hands and rub them together until they feel dry.  Avoid touching your eyes, nose, and mouth with unwashed hands. Avoid close contact  Stay home if you are sick.  Avoid close contact with people who are sick.  Put distance between yourself and other people. ? Remember that some people without symptoms may be able to spread virus. ? This is especially important for people who are at higher risk of getting very sick.www.cdc.gov/coronavirus/2019-ncov/need-extra-precautions/people-at-higher-risk.html Cover your mouth and nose with a cloth face cover when around others  You could spread COVID-19 to others even if you do not feel sick.  Everyone should wear a cloth face cover when they have to go out in public, for example to the grocery store or to pick up other necessities. ? Cloth face coverings should not be placed on young children under age 2, anyone who has trouble breathing, or is unconscious,  incapacitated or otherwise unable to remove the mask without assistance.  The cloth face cover is meant to protect other people in case you are infected.  Do NOT use a facemask meant for a healthcare worker.  Continue to keep about 6 feet between yourself and others. The cloth face cover is not a substitute for social distancing. Cover coughs and sneezes  If you are in a private setting and do not have on your cloth face covering, remember to always cover your mouth and nose with a tissue when you cough or sneeze or use the inside of your elbow.  Throw used tissues in the trash.  Immediately wash your hands with soap and water for at least 20 seconds. If soap and water are not readily available, clean your hands with a hand sanitizer that contains at least 60% alcohol. Clean and disinfect  Clean AND disinfect frequently touched surfaces daily. This includes tables, doorknobs, light switches, countertops, handles, desks, phones, keyboards, toilets, faucets, and sinks. www.cdc.gov/coronavirus/2019-ncov/prevent-getting-sick/disinfecting-your-home.html  If surfaces are dirty, clean them: Use detergent or soap and water prior to disinfection.  Then, use a household disinfectant. You can see a list of EPA-registered household disinfectants here. cdc.gov/coronavirus 06/01/2018 This information is not intended to replace advice given to you by your health care provider. Make sure you discuss any questions you have with your health care provider. Document Released: 05/11/2018 Document Revised: 06/09/2018 Document Reviewed: 05/11/2018 Elsevier Patient Education  2020 Elsevier Inc.  

## 2018-10-14 NOTE — H&P (Signed)
.  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 Presenting with worsening dyspnea. [facility]. Upon arrival, he was found to be dyspneic and told to come to the ED. Family reports that he's been non-compliant on most of his medication, but definitely lasix cause he doesn't like that it makes him pee so much. He says that his breathing has been slowly worsening over the last couple of weeks. His albuterol did not help. It got to the point where he couldn't get comfortable last night no matter what he did. He has felt short of breath with all activity.  ED Course: In the ED he was found to have a BNP of 1300+. He was given lasix and placed on Bipap. He was COVID negative. TR[facility] was called for admission.   Review of Systems: Reports dyspnea, edema. Denies CP, N, V, palpitations. Remainder of 10 point review of systems is otherwise negative for all not mentioned in HPI.    Past Medical History:  Diagnosis Date   Anemia, unspecified    CAD (coronary artery disease)    Cataracts, bilateral    CHF (congestive heart failure) (HCC)    Chronic airway obstruction, not elsewhere classified    on o2 at night   Diabetes mellitus    Diverticulosis of colon (without mention of hemorrhage)    Dyslipidemia    Emphysema    Hemorrhoids    ICD (implantable cardiac defibrillator) in place    [facility]   Ischemic cardiomyopathy    Macular degeneration    MI, old 2010 or 2011   Prostatic hypertrophy    hx of   Retention of urine, unspecified    Unspecified disorder resulting from impaired renal function     Past Surgical History:  Procedure Laterality Date   CARDIAC DEFIBRILLATOR PLACEMENT  2011   [facility]   CATARACT EXTRACTION       PACEMAKER INSERTION  2011   [facility]     4/11   TONSILLECTOMY  85 yo     reports that he quit smoking about 9 years ago. His smoking use included cigarettes. He has never used smokeless tobacco. He reports current alcohol use. He reports that he does not use drugs.  No Known Allergies  Family History  Problem Relation Age of Onset   Coronary artery disease Mother    Stroke Mother    Congestive Heart Failure Mother    Lung cancer Father        smoker   Breast cancer Sister    Atrial fibrillation Daughter    Heart attack Brother     Prior to Admission medications   Medication Sig Start Date End Date Taking? Authorizing Provider  albuterol (PROVENTIL) (2.5 MG/3ML) 0.083% nebulizer solution Take 3 mLs (2.5 mg total) every 2 (two) hours as needed by nebulization for wheezing or shortness of breath. 12/05/16  Yes Ghimire, Henreitta Leber, MD  ALPRAZolam Duanne Moron) 0.25 MG tablet Take 1 tablet (0.25 mg total) by mouth at bedtime as needed for sleep. 09/11/17  Yes Ladell Pier, MD  arformoterol (BROVANA) 15 MCG/2ML NEBU Take 15 mcg by nebulization 2 (two) times daily.   Yes [provider]  budesonide (PULMICORT) 0.5 MG/2ML nebulizer solution Take 2  mLs (0.5 mg total) by nebulization 2 (two) times daily. 08/31/12  Yes Parrett, Tammy S, NP  ferrous sulfate 325 (65 FE) MG tablet Take 1 tablet (325 mg total) by mouth 2 (two) times daily with a meal. 11/14/16  Yes Ollis, Brandi L, NP  guaiFENesin (MUCINEX) 600 MG 12 hr tablet Take 600 mg by mouth daily.    Yes [provider]  metoprolol tartrate (LOPRESSOR) 25 MG tablet Take 12.5 mg by mouth 2 (two) times daily. Reported on 06/06/2015   Yes [provider]  pantoprazole (PROTONIX) 40 MG tablet Take 1 tablet (40 mg total) by mouth daily. Patient taking differently: Take 40 mg by mouth 2 (two) times daily.  11/15/16  Yes Ollis, Brandi L, NP  rosuvastatin (CRESTOR) 5 MG tablet Take 1 tablet (5 mg total) by mouth daily.  10/12/17  Yes Josue Hector, MD  trimethoprim (TRIMPEX) 100 MG tablet Take 100 mg by mouth daily.  08/13/18  Yes [provider]  vitamin B-12 (CYANOCOBALAMIN) 1000 MCG tablet Take 1,000 mcg by mouth daily.   Yes [provider]  vitamin C (ASCORBIC ACID) 250 MG tablet Take 250 mg by mouth daily.   Yes [provider]  albuterol (PROAIR HFA) 108 (90 Base) MCG/ACT inhaler Inhale 2 puffs into the lungs every 4 (four) hours as needed for wheezing or shortness of breath. Patient not taking: Reported on 10/14/2018 12/26/16   Parrett, Fonnie Mu, NP  ciprofloxacin (CIPRO) 500 MG tablet Take 1 tablet (500 mg total) by mouth 2 (two) times daily. Patient not taking: Reported on 10/14/2018 08/13/18   Ladell Pier, MD  furosemide (LASIX) 20 MG tablet Take 1 tablet (20 mg total) by mouth daily as needed for edema. Patient not taking: Reported on 10/14/2018 11/25/17   Josue Hector, MD  loratadine (CLARITIN) 10 MG tablet Take 1 tablet (10 mg total) by mouth daily. Patient not taking: Reported on 10/14/2018 02/06/17   Barton Dubois, MD  methylPREDNISolone (MEDROL DOSEPAK) 4 MG TBPK tablet 6 x 1 day, 5 x 1 day, 4 x 1 day, 3 x 1 day, 2 x 1 day, 1 x 1 day Patient not taking: Reported on 10/14/2018 09/28/18   Harle Stanford., PA-C  nitroGLYCERIN (NITROSTAT) 0.4 MG SL tablet Place 1 tablet (0.4 mg total) under the tongue every 5 (five) minutes as needed for chest pain. UP TO 3 DOSES THEN CALL EMS 11/02/16   Domenic Polite, MD  ondansetron (ZOFRAN) 8 MG tablet Take 0.5-1 tablets (4-8 mg total) by mouth every 8 (eight) hours as needed for nausea or vomiting. Take '8mg'$  30-60 min prior to melphalan. Patient not taking: Reported on 10/14/2018 08/10/18   Ladell Pier, MD  OXYGEN Inhale 2 L into the lungs at bedtime.     [provider]  predniSONE (DELTASONE) 20 MG tablet Take 2 tablets (40 mg total) by mouth daily with breakfast. Take 4 days on, 24 days off, repeat every 28 days Patient not  taking: Reported on 10/14/2018 08/10/18   Ladell Pier, MD  predniSONE (DELTASONE) 20 MG tablet Take '40mg'$  daily x 3, '30mg'$  daily x 3, 20 mg daily x 3, 10 mg daily x 3 then stop Patient not taking: Reported on 10/14/2018 10/04/18   Marshell Garfinkel, MD  sodium chloride (OCEAN) 0.65 % SOLN nasal spray Place 2 sprays into both nostrils 2 (two) times daily. Patient not taking: Reported on 10/14/2018 11/14/16   Donita Brooks, NP    Physical  Exam: Vitals:   10/14/18 1445 10/14/18 1500 10/14/18 1515 10/14/18 1600  BP: (!) 109/95 121/84  129/74  Pulse:  78 84   Resp:    16  TempSrc:      SpO2:  97% 100%   Weight:        Constitutional: 85 y.o. male NAD, calm, comfortable Vitals:   10/14/18 1445 10/14/18 1500 10/14/18 1515 10/14/18 1600  BP: (!) 109/95 121/84  129/74  Pulse:  78 84   Resp:    16  TempSrc:      SpO2:  97% 100%   Weight:       General: 86 y.o. male resting in bed in mild distress on Bipap Eyes: PERRL, normal sclera ENMT: Nares patent w/o discharge, orophaynx clear, dentition normal, ears w/o discharge/lesions/ulcers Cardiovascular: RRR, +S1, S2, no m/g/r, equal pulses throughout Respiratory: tight, decreased at bases, increased WOB GI: BS+, NDNT, no masses noted, no organomegaly noted MSK: No c/c; BLE 3+ pitting edema Skin: No rashes, bruises, ulcerations noted Neuro: A&O x 3, no focal deficits Psyc: Appropriate interaction and affect, calm/cooperative    Labs on Admission: I have personally reviewed following labs and imaging studies  CBC: Recent Labs  Lab 10/14/18 0932 10/14/18 1305  WBC 4.4 5.2  NEUTROABS 3.5 4.6  HGB 9.3* 9.9*  HCT 29.7* 32.2*  MCV 82.7 86.1  PLT 71* 82*   Basic Metabolic Panel: Recent Labs  Lab 10/14/18 1305  NA 139  K 5.3*  CL 103  CO2 24  GLUCOSE 145*  BUN 67*  CREATININE 1.72*  CALCIUM 9.0   GFR: Estimated Creatinine Clearance: 25.7 mL/min (A) (by C-G formula based on SCr of 1.72 mg/dL (H)). Liver Function  Tests: Recent Labs  Lab 10/14/18 1305  AST 27  ALT 30  ALKPHOS 48  BILITOT 1.0  PROT 6.4*  ALBUMIN 3.6   No results for input(s): LIPASE, AMYLASE in the last 168 hours. No results for input(s): AMMONIA in the last 168 hours. Coagulation Profile: No results for input(s): INR, PROTIME in the last 168 hours. Cardiac Enzymes: No results for input(s): CKTOTAL, CKMB, CKMBINDEX, TROPONINI in the last 168 hours. BNP (last 3 results) Recent Labs    12/02/17 1553  PROBNP 1,328*   HbA1C: No results for input(s): HGBA1C in the last 72 hours. CBG: No results for input(s): GLUCAP in the last 168 hours. Lipid Profile: No results for input(s): CHOL, HDL, LDLCALC, TRIG, CHOLHDL, LDLDIRECT in the last 72 hours. Thyroid Function Tests: No results for input(s): TSH, T4TOTAL, FREET4, T3FREE, THYROIDAB in the last 72 hours. Anemia Panel: No results for input(s): VITAMINB12, FOLATE, FERRITIN, TIBC, IRON, RETICCTPCT in the last 72 hours. Urine analysis:    Component Value Date/Time   COLORURINE YELLOW 10/07/2018 1100   APPEARANCEUR HAZY (A) 10/07/2018 1100   LABSPEC 1.015 10/07/2018 1100   LABSPEC 1.025 05/01/2010 1454   PHURINE 5.0 10/07/2018 1100   GLUCOSEU NEGATIVE 10/07/2018 1100   HGBUR LARGE (A) 10/07/2018 1100   BILIRUBINUR NEGATIVE 10/07/2018 1100   BILIRUBINUR Negative 05/01/2010 1454   KETONESUR NEGATIVE 10/07/2018 1100   PROTEINUR 30 (A) 10/07/2018 1100   UROBILINOGEN 0.2 06/07/2014 2018   NITRITE NEGATIVE 10/07/2018 1100   LEUKOCYTESUR LARGE (A) 10/07/2018 1100   LEUKOCYTESUR Negative 05/01/2010 1454    Radiological Exams on Admission: Dg Chest Portable 1 View  Result Date: 10/14/2018 CLINICAL DATA:  Increasing shortness of breath. EXAM: PORTABLE CHEST 1 VIEW COMPARISON:  September 15, 2018 FINDINGS: Stable position of cardiac pacemaker.  Cardiomediastinal silhouette is normal. Mediastinal contours appear intact. Tortuosity and calcific atherosclerotic disease of the aorta.  Bilateral layering pleural effusions with mild interstitial pulmonary edema. Osseous structures are without acute abnormality. Soft tissues are grossly normal. IMPRESSION: Bilateral layering pleural effusions with mild interstitial pulmonary edema. Electronically Signed   By: Fidela Salisbury M.D.   On: 10/14/2018 13:46    EKG: Independently reviewed.  Assessment/Plan Principal Problem:   Acute diastolic CHF (congestive heart failure) (HCC) Active Problems:   URINARY RETENTION   Acute on chronic renal insufficiency   Acute on chronic respiratory failure with hypoxia (HCC)   Multiple myeloma (HCC)   Acute on chronic diastolic HF Acute on chronic respiratory failure with hypoxia     - non-compliance with lasix     - at this point, continue Bipap, lasix '60mg'$  BID     - place in SDU     - consult cardiology     - repeat echo     - daily weights  Elevated troponins     - consult cardiology     - EKG w/o ST changes     - trend trop; if next one is elevated, will need to move to [facility]     - place SDU  AKI on CKD 3     - right now we need to diuresis     - I&O     - watch nephrotoxins otherwise  Multiple myeloma     - per oncology  Normocytic anemia     - Hgb stable, monitor     - no evidence of bleed  DM2     - SSI, CBG  Urinary retention     - place foley for lasix  DVT prophylaxis: heparin  Code Status: Intubation only; no shocks, chest compressions  Family Communication: With dtr on phone  Disposition Plan: TBD  Consults called: Spoke with cardiology.  Admission status: Inpatient. Concern for acute decompensation without further in-house intervention.    Jonnie Finner DO [facility] Pager [phone]  If 7PM-7AM, please contact night-coverage [email or web address] Password [facility]  10/14/2018, 4:52 PM

## 2018-10-14 NOTE — ED Triage Notes (Addendum)
Pt arrives from [facility]. Pt went there for [facility]. Pt's family initially thought it was from anemia, but hgb was 9.3 . Concerned for CHF exacerbation.  Per [facility], pt has not taken his lasix lately. Pt self caths. Pt states he has had frequent UTIs

## 2018-10-14 NOTE — Telephone Encounter (Signed)
Spoke with daughter , pt went to oncology , no transfusion indicated but due to resp distress sent to ER , to be admitted .

## 2018-10-14 NOTE — ED Notes (Signed)
EMT Herbie Baltimore made this RN aware that patient O2 sat in 60s on 2L via nasal cannula. Pt put on NRB. Made Dr Laverta Baltimore aware.

## 2018-10-14 NOTE — ED Notes (Signed)
Date and time results received: 10/14/18 1905  Test: Troponin Critical Value: 3,613  Name of Provider Notified: Raliegh Ip Schorr mid-level  Orders Received? Or Actions Taken?: mid-level will call hospitalist and cardiology

## 2018-10-15 ENCOUNTER — Inpatient Hospital Stay (HOSPITAL_COMMUNITY): Payer: Medicare Other

## 2018-10-15 ENCOUNTER — Encounter (HOSPITAL_COMMUNITY)
Admission: EM | Disposition: A | Discharge status: Home Health | Payer: Self-pay | Source: Home / Self Care | Attending: Family Medicine

## 2018-10-15 ENCOUNTER — Inpatient Hospital Stay: Payer: Medicare Other

## 2018-10-15 ENCOUNTER — Ambulatory Visit: Payer: Medicare Other

## 2018-10-15 DIAGNOSIS — J9621 Acute and chronic respiratory failure with hypoxia: Secondary | ICD-10-CM

## 2018-10-15 DIAGNOSIS — I342 Nonrheumatic mitral (valve) stenosis: Secondary | ICD-10-CM

## 2018-10-15 DIAGNOSIS — C9000 Multiple myeloma not having achieved remission: Secondary | ICD-10-CM

## 2018-10-15 DIAGNOSIS — R7989 Other specified abnormal findings of blood chemistry: Secondary | ICD-10-CM

## 2018-10-15 DIAGNOSIS — N289 Disorder of kidney and ureter, unspecified: Secondary | ICD-10-CM

## 2018-10-15 DIAGNOSIS — D630 Anemia in neoplastic disease: Secondary | ICD-10-CM

## 2018-10-15 DIAGNOSIS — N189 Chronic kidney disease, unspecified: Secondary | ICD-10-CM

## 2018-10-15 DIAGNOSIS — I5033 Acute on chronic diastolic (congestive) heart failure: Principal | ICD-10-CM

## 2018-10-15 DIAGNOSIS — I340 Nonrheumatic mitral (valve) insufficiency: Secondary | ICD-10-CM

## 2018-10-15 LAB — BASIC METABOLIC PANEL
Anion gap: 14 (ref 5–15)
BUN: 71 mg/dL — ABNORMAL HIGH (ref 8–23)
CO2: 27 mmol/L (ref 22–32)
Calcium: 8.9 mg/dL (ref 8.9–10.3)
Chloride: 101 mmol/L (ref 98–111)
Creatinine, Ser: 1.87 mg/dL — ABNORMAL HIGH (ref 0.61–1.24)
GFR calc Af Amer: 36 mL/min — ABNORMAL LOW (ref >60)
GFR calc non Af Amer: 31 mL/min — ABNORMAL LOW (ref >60)
Glucose, Bld: 76 mg/dL (ref 70–99)
Potassium: 3.9 mmol/L (ref 3.5–5.1)
Sodium: 142 mmol/L (ref 135–145)

## 2018-10-15 LAB — LIPID PANEL
Cholesterol: 131 mg/dL (ref 0–200)
HDL: 45 mg/dL (ref >40)
LDL Cholesterol: 69 mg/dL (ref 0–99)
Total CHOL/HDL Ratio: 2.9 RATIO
Triglycerides: 83 mg/dL (ref <150)
VLDL: 17 mg/dL (ref 0–40)

## 2018-10-15 LAB — PROTEIN ELECTROPHORESIS, SERUM
A/G Ratio: 1.5 (ref 0.7–1.7)
Albumin ELP: 3.5 g/dL (ref 2.9–4.4)
Alpha-1-Globulin: 0.2 g/dL (ref 0.0–0.4)
Alpha-2-Globulin: 0.5 g/dL (ref 0.4–1.0)
Beta Globulin: 1.5 g/dL — ABNORMAL HIGH (ref 0.7–1.3)
Gamma Globulin: 0.1 g/dL — ABNORMAL LOW (ref 0.4–1.8)
Globulin, Total: 2.4 g/dL (ref 2.2–3.9)
M-Spike, %: 0.6 g/dL — ABNORMAL HIGH
Total Protein ELP: 5.9 g/dL — ABNORMAL LOW (ref 6.0–8.5)

## 2018-10-15 LAB — ECHOCARDIOGRAM COMPLETE
Height: 72 in
Weight: 2271.62 oz

## 2018-10-15 LAB — TROPONIN I (HIGH SENSITIVITY): Troponin I (High Sensitivity): 2858 ng/L (ref <18)

## 2018-10-15 LAB — HEPARIN LEVEL (UNFRACTIONATED)
Heparin Unfractionated: 0.56 IU/mL (ref 0.30–0.70)
Heparin Unfractionated: 0.59 IU/mL (ref 0.30–0.70)

## 2018-10-15 LAB — CBC
HCT: 29.2 % — ABNORMAL LOW (ref 39.0–52.0)
Hemoglobin: 9.4 g/dL — ABNORMAL LOW (ref 13.0–17.0)
MCH: 26.7 pg (ref 26.0–34.0)
MCHC: 32.2 g/dL (ref 30.0–36.0)
MCV: 83 fL (ref 80.0–100.0)
Platelets: 59 10*3/uL — ABNORMAL LOW (ref 150–400)
RBC: 3.52 MIL/uL — ABNORMAL LOW (ref 4.22–5.81)
RDW: 17.5 % — ABNORMAL HIGH (ref 11.5–15.5)
WBC: 3.5 10*3/uL — ABNORMAL LOW (ref 4.0–10.5)
nRBC: 1.4 % — ABNORMAL HIGH (ref 0.0–0.2)

## 2018-10-15 LAB — GLUCOSE, CAPILLARY
Glucose-Capillary: 104 mg/dL — ABNORMAL HIGH (ref 70–99)
Glucose-Capillary: 150 mg/dL — ABNORMAL HIGH (ref 70–99)
Glucose-Capillary: 170 mg/dL — ABNORMAL HIGH (ref 70–99)
Glucose-Capillary: 62 mg/dL — ABNORMAL LOW (ref 70–99)
Glucose-Capillary: 67 mg/dL — ABNORMAL LOW (ref 70–99)
Glucose-Capillary: 73 mg/dL (ref 70–99)

## 2018-10-15 LAB — KAPPA/LAMBDA LIGHT CHAINS
Kappa free light chain: 812.4 mg/L — ABNORMAL HIGH (ref 3.3–19.4)
Kappa, lambda light chain ratio: 176.61 — ABNORMAL HIGH (ref 0.26–1.65)
Lambda free light chains: 4.6 mg/L — ABNORMAL LOW (ref 5.7–26.3)

## 2018-10-15 SURGERY — LEFT HEART CATH AND CORONARY ANGIOGRAPHY
Anesthesia: LOCAL

## 2018-10-15 MED ORDER — METOPROLOL TARTRATE 12.5 MG HALF TABLET
12.5000 mg | ORAL_TABLET | Freq: Two times a day (BID) | ORAL | Status: DC
  Administered 2018-10-16: 11:00:00 12.5 mg via ORAL
  Filled 2018-10-15: qty 1

## 2018-10-15 MED ORDER — LORAZEPAM 2 MG/ML IJ SOLN: 0.5000 mg | Freq: Every evening | INTRAMUSCULAR | Status: DC | PRN

## 2018-10-15 MED ORDER — INFLUENZA VAC A&B SA ADJ QUAD 0.5 ML IM PRSY
0.5000 mL | PREFILLED_SYRINGE | INTRAMUSCULAR | Status: AC
  Administered 2018-10-16: 11:00:00 0.5 mL via INTRAMUSCULAR
  Filled 2018-10-15: qty 0.5

## 2018-10-15 MED ORDER — FUROSEMIDE 10 MG/ML IJ SOLN
20.0000 mg | Freq: Once | INTRAMUSCULAR | Status: AC
  Administered 2018-10-15: 20 mg via INTRAVENOUS

## 2018-10-15 MED ORDER — ALPRAZOLAM 0.25 MG PO TABS
0.2500 mg | ORAL_TABLET | Freq: Once | ORAL | Status: AC
  Administered 2018-10-15: 0.25 mg via ORAL

## 2018-10-15 NOTE — Progress Notes (Addendum)
Patient Demographics:    [name] [name], is a 85 y.o. male, DOB - Jun 16, 1925, FXT:[id number]  Admit date - 10/14/2018   Admitting Physician Jonnie Finner, DO  Outpatient Primary MD for the patient is [name], MD  LOS - 1   Chief Complaint  Patient presents with   Congestive Heart Failure        Subjective:    [name] [name] today has no fevers, no emesis,  No chest pain, shortness of breath improved with IV Lasix and BiPAP use Daughter at bedside-questions answered -Dr. 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  He was found to have CAD with stents in the RCA/circumflex and ramus intermedius.  s/p [facility] AICD .  Admitted on 10/14/2018 with acute respiratory failure with hypoxia in the setting of CHF exacerbation with elevated troponins   A/p 1)HFpEF--- patient presenting with acute on chronic dCHF exacerbation with volume overload and hypoxia requiring BiPAP initially, last known EF 55 to 60% based on echo from November 2019, - repeat echo pending, elevated troponins noted, chest x-ray with pleural effusions and interstitial edema -PTA patient was reluctant to take Lasix at home due to concerns about urinary retention requiring self-catheterization -Lasix 60 mg IV twice daily as BP tolerates -Daily weights and fluid input and output monitoring -Continue submental oxygen  2)Possible  NSTEMI--Versus demand ischemia in the setting of CHF exacerbation ---troponin peaked at 4024, currently down to 2858, chest pain-free -- after discussion of risk versus benefit of LHC with patient and cardiology service -No LHC planned at this time (if stented patient would not be able to tolerate DAPT due to chronic thrombocytopenia requiring transfusion), also patient has renal dysfunction and contrast may worsen his renal function- -cardiology service recommends medical management with IV heparin x48 hours, aspirin 81 mg daily, metoprolol 12.5 mg twice daily and Crestor 5 mg daily LDL-69 with HDL of 45 -Repeat echo pending  3)AKI----acute kidney injury on CKD stage - III, worsening renal function due to reduced renal perfusion in the setting of CHF exacerbation,   creatinine on admission=1.72  ,   baseline creatinine = 1.1 (09/20/18)    , creatinine is now= 1.87     , renally adjust medications, avoid nephrotoxic agents/dehydration/hypotension -We will may have to tolerate slightly worsening renal function with diuretics -Hold trimethoprim  4) pancytopenia/multiple myeloma----BBC is down to 3.5, hemoglobin is 9.4 from 9.9 on admission, platelets down to 59 from 82 on admission --- PRBC Transfusion threshold for patient is hemoglobin below 8 -  Transfuse platelets if below 10 K or if bleeding  5) acute hypoxic respiratory failure--- underlying COPD, compounded by CHF exacerbation- required BiPAP initially may still need BiPAP on and off, continue supplemental oxygen - 6)Dysphagia Concerns-  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-feed as tolerated  7)BPH with LUTs--- difficulties with urination, PTA patient was doing self-catheterization, okay to Place Foley given need for IV diuresis  8)Social/Ethics--please see CODE STATUS below, awaiting palliative care consult  Disposition/Need for in-Hospital Stay- patient unable to be discharged at this time due to significant oxygen  requirement requiring BiPAP, stable NSTEMI requiring IV heparin  Code Status : Okay for BiPAP of pressures but no intubation no defibrillation however patient has AICD -Awaiting further palliative care consult to further delineate advanced directives  Family Communication:    (patient is alert, awake and coherent) --Discussed with daughter [name] at bedside  Disposition Plan  : TBD---  Consults  :  Cardiology/palliative  DVT Prophylaxis  : IV heparin  Lab Results  Component Value Date   PLT 59 (L) 10/15/2018    Inpatient Medications  Scheduled Meds:  aspirin EC  81 mg Oral Daily   ferrous sulfate  325 mg Oral BID WC   furosemide  60 mg Intravenous BID   insulin aspart  0-9 Units Subcutaneous Q4H   pantoprazole  40 mg Oral Daily   rosuvastatin  5 mg Oral Daily   sodium chloride flush  3 mL Intravenous Q12H   trimethoprim  100 mg Oral Daily   vitamin B-12  1,000 mcg Oral Daily   vitamin C  250 mg Oral Daily   Continuous Infusions:  sodium chloride     heparin 800 Units/hr (10/14/18 1814)   PRN Meds:.sodium chloride, acetaminophen, ALPRAZolam, LORazepam, nitroGLYCERIN, ondansetron (ZOFRAN) IV, sodium chloride flush  Anti-infectives (From admission, onward)   Start     Dose/Rate Route Frequency Ordered Stop   10/14/18 2100  trimethoprim (TRIMPEX) tablet 100 mg     100 mg Oral Daily 10/14/18 1959         Objective:   Vitals:   10/15/18 0844 10/15/18 1124 10/15/18 1155 10/15/18 1200  BP:  114/69    Pulse: 71 76    Resp: 15 (!) 25  20  Temp:  (!) 97.2 F (36.2 C)  97.6 F (36.4 C)  TempSrc:  Axillary  Oral  SpO2: 100% 100% 100%   Weight:      Height:        Wt Readings from Last 3 Encounters:  10/15/18 64.4 kg  10/01/18 66.6 kg  09/23/18 66.2 kg     Intake/Output Summary (Last 24 hours) at 10/15/2018 1600 Last data filed at 10/15/2018 3428 Gross per 24 hour  Intake 0 ml  Output 2250 ml  Net -2250 ml   Physical Exam Gen:-Respiratory  distress/increased work of breathing  HEENT:- [place].AT, No sclera icterus Ears- very HOH Nose- [place] 4L/min (BiPAP mask on and off) Neck-Supple Neck,  Lungs-diminished in bases with faint bibasilar rales, no wheezing CV- S1, S2 normal,regular,lt subclavian area with AICD Abd-  +ve B.Sounds, Abd Soft, No tenderness,    Extremity/Skin:- trace  edema, pedal pulses present  Psych-affect is flat to depressed, oriented x3 Neuro-generalized weakness, no new focal deficits, no tremors GU--Foley catheter in situ   Data Review:   Micro Results Recent Results (from the past 240 hour(s))  Urine Culture     Status: Abnormal   Collection Time: 10/07/18 11:00 AM   Specimen: Urine, Clean Catch  Result Value Ref Range Status   Specimen Description   Final    URINE, CLEAN CATCH Performed at [facility], [address], [place], [place] [address]    Special Requests   Final    NONE Performed at [facility], [place] [address] [place], [place] [address]    Culture (A)  Final    <10,000 COLONIES/mL INSIGNIFICANT GROWTH Performed at [place] [address], [place], [place] [address]    Report Status 10/08/2018 FINAL  Final  SARS Coronavirus 2 [facility] order, Performed in [facility] hospital lab) Nasopharyngeal Nasopharyngeal Swab     Status: None   Collection Time: 10/14/18  1:02 PM   Specimen: Nasopharyngeal Swab  Result Value Ref Range Status   SARS Coronavirus 2 NEGATIVE NEGATIVE Final    Comment: (NOTE) If result is NEGATIVE SARS-CoV-2 target nucleic acids are NOT DETECTED. The SARS-CoV-2 RNA is generally detectable in upper and lower  respiratory specimens during the acute phase of infection. The lowest  concentration of SARS-CoV-2 viral copies this assay can detect is 250  copies / mL. A negative result does not preclude SARS-CoV-2 infection  and should not be used as the sole basis for treatment or other  patient management  decisions.  A negative result may occur with  improper specimen collection / handling, submission of specimen other  than nasopharyngeal swab, presence of viral mutation(s) within the  areas targeted by this assay, and inadequate number of viral copies  (<250 copies / mL). A negative result must be combined with clinical  observations, patient history, and epidemiological information. If result is POSITIVE SARS-CoV-2 target nucleic acids are DETECTED. The SARS-CoV-2 RNA is generally detectable in upper and lower  respiratory specimens dur ing the acute phase of infection.  Positive  results are indicative of active infection with SARS-CoV-2.  Clinical  correlation with patient history and other diagnostic information is  necessary to determine patient infection status.  Positive results do  not rule out bacterial infection or co-infection with other viruses. If result is PRESUMPTIVE POSTIVE SARS-CoV-2 nucleic acids MAY BE PRESENT.   A presumptive positive result was obtained on the submitted specimen  and confirmed on repeat testing.  While 2019 novel coronavirus  (SARS-CoV-2) nucleic acids may be present in the submitted sample  additional confirmatory testing may be necessary for epidemiological  and / or clinical management purposes  to differentiate between  SARS-CoV-2 and other Sarbecovirus currently known to infect humans.  If clinically indicated additional testing with an alternate test  methodology [phone]) is advised. The SARS-CoV-2 RNA is generally  detectable in upper and lower respiratory sp ecimens during the acute  phase of infection. The expected result is Negative. Fact Sheet for Patients:  StrictlyIdeas.no Fact Sheet for Healthcare Providers: BankingDealers.co.za This test is not yet approved or cleared by the [place] FDA and has been authorized for detection and/or diagnosis of SARS-CoV-2 by FDA under an  Emergency Use Authorization (EUA).  This EUA will remain in effect (meaning this test can be used) for the duration of the COVID-19 declaration under Section 564(b)(1) of the Act, 21 U.S.C. section 360bbb-3(b)(1), unless the authorization is terminated or revoked sooner. Performed at [facility], [place] [address], [place], [place] [address]    Radiology Reports Ct Angio Chest W/cm &/or Wo Cm  Result Date: 09/27/2018 CLINICAL DATA:  Shortness of breath and chest pain EXAM: CT ANGIOGRAPHY CHEST WITH CONTRAST TECHNIQUE: Multidetector  CT imaging of the chest was performed using the standard protocol during bolus administration of intravenous contrast. Multiplanar CT image reconstructions and MIPs were obtained to evaluate the vascular anatomy. CONTRAST:  11m OMNIPAQUE IOHEXOL 350 MG/ML SOLN COMPARISON:  09/15/2018 plain film FINDINGS: Cardiovascular: Atherosclerotic calcifications of the thoracic aorta and its branches are seen. No aneurysmal dilatation is noted. Opacification is suboptimal to evaluate for dissection. Coronary calcifications are seen. The pulmonary artery shows a normal branching pattern. No findings to suggest pulmonary emboli are identified. Mediastinum/Nodes: Thoracic inlet is within normal limits. No sizable hilar or mediastinal adenopathy is noted. The esophagus is within normal limits. Lungs/Pleura: Small bilateral pleural effusions are noted. Emphysematous changes are seen. Mild atelectatic changes are noted in the lingula. Mild scarring is noted in the right middle lobe inferiorly. No focal parenchymal nodule is seen. Mild dependent atelectatic changes are seen. Upper Abdomen: Visualized upper abdomen shows cystic changes within the kidneys but incompletely evaluated. Musculoskeletal: Degenerative changes of the thoracic spine are noted. No acute rib abnormality is noted. No compression deformities are seen. Review of the MIP images confirms the above findings.  IMPRESSION: No evidence of pulmonary emboli. Small bilateral pleural effusions. Emphysematous changes with mild dependent basilar atelectasis. Density is noted in the lingula posteriorly likely related atelectasis. Aortic Atherosclerosis (ICD10-I70.0) and Emphysema (ICD10-J43.9). Electronically Signed   By: MInez CatalinaM.D.   On: 09/27/2018 15:58   Dg Chest Portable 1 View  Result Date: 10/14/2018 CLINICAL DATA:  Increasing shortness of breath. EXAM: PORTABLE CHEST 1 VIEW COMPARISON:  September 15, 2018 FINDINGS: Stable position of cardiac pacemaker. Cardiomediastinal silhouette is normal. Mediastinal contours appear intact. Tortuosity and calcific atherosclerotic disease of the aorta. Bilateral layering pleural effusions with mild interstitial pulmonary edema. Osseous structures are without acute abnormality. Soft tissues are grossly normal. IMPRESSION: Bilateral layering pleural effusions with mild interstitial pulmonary edema. Electronically Signed   By: DFidela SalisburyM.D.   On: 10/14/2018 13:46     CBC Recent Labs  Lab 10/14/18 0932 10/14/18 1305 10/15/18 0810  WBC 4.4 5.2 3.5*  HGB 9.3* 9.9* 9.4*  HCT 29.7* 32.2* 29.2*  PLT 71* 82* 59*  MCV 82.7 86.1 83.0  MCH 25.9* 26.5 26.7  MCHC 31.3 30.7 32.2  RDW 17.5* 17.7* 17.5*  LYMPHSABS 0.7 0.3*  --   MONOABS 0.2 0.3  --   EOSABS 0.0 0.0  --   BASOSABS 0.0 0.0  --     Chemistries  Recent Labs  Lab 10/14/18 1305 10/15/18 1034  NA 139 142  K 5.3* 3.9  CL 103 101  CO2 24 27  GLUCOSE 145* 76  BUN 67* 71*  CREATININE 1.72* 1.87*  CALCIUM 9.0 8.9  AST 27  --   ALT 30  --   ALKPHOS 48  --   BILITOT 1.0  --    ------------------------------------------------------------------------------------------------------------------ Recent Labs    10/15/18 1034  CHOL 131  HDL 45  LDLCALC 69  TRIG 83  CHOLHDL 2.9    Lab Results  Component Value Date   HGBA1C 5.6 07/01/2013    ------------------------------------------------------------------------------------------------------------------ No results for input(s): TSH, T4TOTAL, T3FREE, THYROIDAB in the last 72 hours.  Invalid input(s): FREET3 ------------------------------------------------------------------------------------------------------------------ No results for input(s): VITAMINB12, FOLATE, FERRITIN, TIBC, IRON, RETICCTPCT in the last 72 hours.  Coagulation profile No results for input(s): INR, PROTIME in the last 168 hours.  No results for input(s): DDIMER in the last 72 hours.  Cardiac Enzymes Recent Labs  Lab 10/14/18 2030  CKMB 19.7*   ----------------------------------------------------------------------------------------------------------------  Component Value Date/Time   BNP 1,397.7 (H) 10/14/2018 1302   BNP 45.9 06/06/2015 1629   Tabatha Razzano M.D on 10/15/2018 at 4:00 PM  Go to [email or web address] - for contact info  [facility] - Office  [phone]

## 2018-10-15 NOTE — Progress Notes (Signed)
ANTICOAGULATION CONSULT NOTE - Follow Up Consult  Pharmacy Consult for Heparin Indication: chest pain/ACS  No Known Allergies  Patient Measurements: Height: 6' (182.9 cm) Weight: 141 lb 15.6 oz (64.4 kg) IBW/kg (Calculated) : 77.6 Heparin Dosing Weight: 64.4 kg  Vital Signs: Temp: 97.2 F (36.2 C) (09/18 1124) Temp Source: Axillary (09/18 1124) BP: 114/69 (09/18 1124) Pulse Rate: 76 (09/18 1124)  Labs: Recent Labs    10/14/18 0932 10/14/18 1305  10/14/18 1805 10/14/18 1929 10/14/18 2030 10/15/18 0318 10/15/18 0652 10/15/18 0810 10/15/18 1034  HGB 9.3* 9.9*  --   --   --   --   --   --  9.4*  --   HCT 29.7* 32.2*  --   --   --   --   --   --  29.2*  --   PLT 71* 82*  --   --   --   --   --   --  59*  --   HEPARINUNFRC  --   --   --   --   --   --  0.56  --   --  0.59  CREATININE  --  1.72*  --   --   --   --   --   --   --  1.87*  CKTOTAL  --   --   --   --   --  109  --   --   --   --   CKMB  --   --   --   --   --  19.7*  --   --   --   --   TROPONINIHS  --  236*   < > 3,613* 4,024*  --   --  2,858*  --   --    < > = values in this interval not displayed.    Estimated Creatinine Clearance: 23.4 mL/min (A) (by C-G formula based on SCr of 1.87 mg/dL (H)).    Assessment:   Anticoag: CP/ACS. HL 0.56 and 0.59 both in goal. Hgb stable in the 9's. Plts only 59.  Goal of Therapy:  Heparin level 0.3-0.7 units/ml Monitor platelets by anticoagulation protocol: Yes   Plan:  Plan:  Likely Cath planned (but Scr rising) IV heparin 800 units/hr Daily HL and CBC   Justus Duerr S. Alford Highland, PharmD, BCPS Clinical Staff Pharmacist Eilene Ghazi Stillinger 10/15/2018,11:36 AM

## 2018-10-15 NOTE — ED Notes (Signed)
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 Yes   Triage Complete: Triage complete  Chief Complaint congestive heart failure  Triage Note Pt arrives from [facility]. Pt went there for [facility]. Pt's family initially thought it was from anemia, but hgb was 9.3 . Concerned for CHF exacerbation.  Per [facility] center, pt has not taken his lasix lately. Pt self caths. Pt states he has had frequent UTIs   Allergies No Known Allergies  Level of Care/Admitting Diagnosis ED Disposition    ED Disposition Condition Comment   Admit  Hospital Area: [place] [[id number]]  Level of Care: Progressive [102]  Covid Evaluation: Confirmed COVID Negative  Diagnosis: Acute on chronic diastolic HF (heart failure) [facility]) [id number]  Admitting Physician: Jonnie Finner [id number]  Attending Physician: Jonnie Finner [id number]  Estimated length of stay: past midnight tomorrow  Certification:: I certify this patient will need inpatient services for at least 2 midnights  PT Class (Do Not Modify): Inpatient [101]  PT Acc Code (Do Not Modify): Private [1]       B Medical/Surgery History Past Medical History:  Diagnosis Date  . Anemia, unspecified   . CAD (coronary artery disease)   . Cataracts, bilateral   . CHF (congestive heart failure) ([place])   . Chronic airway obstruction, not elsewhere classified    on o2 at night  . Diabetes mellitus   . Diverticulosis of colon (without mention of hemorrhage)   . Dyslipidemia   . Emphysema   . Hemorrhoids   . ICD (implantable cardiac defibrillator) in place    [facility] jude  . Ischemic cardiomyopathy   . Macular degeneration   . MI, old 2010 or 2011  . Prostatic hypertrophy    hx of  . Retention of  urine, unspecified   . Unspecified disorder resulting from impaired renal function    Past Surgical History:  Procedure Laterality Date  . CARDIAC DEFIBRILLATOR PLACEMENT  2011   [facility]  . CATARACT EXTRACTION    . PACEMAKER INSERTION  2011  . pci     4/11  . TONSILLECTOMY  85 yo     A IV Location/Drains/Wounds Patient Lines/Drains/Airways Status   Active Line/Drains/Airways    Name:   Placement date:   Placement time:   Site:   Days:   Peripheral IV 10/14/18 Left Forearm   10/14/18    1302    Forearm   1   Urethral Catheter Carrie RN  Latex 16 Fr.   10/14/18    1426    Latex   1   Wound / Incision (Open or Dehisced) 06/11/15 Other (Comment) Elbow Left skin tear secondary to patient moving himself in bed    06/11/15    0809    Elbow   1222          Intake/Output Last 24 hours No intake or output data in the 24 hours ending 10/15/18 0002  Labs/Imaging Results for orders placed or performed during the hospital encounter of 10/14/18 (from the past 48 hour(s))  Brain natriuretic peptide     Status: Abnormal   Collection Time: 10/14/18  1:02 PM  Result Value Ref Range   B Natriuretic Peptide 1,397.7 (H) 0.0 - 100.0 pg/mL  Comment: Performed at [facility], [place] [address] [place], [place] [address]  SARS Coronavirus 2 [facility] order, Performed in [facility] hospital lab) Nasopharyngeal Nasopharyngeal Swab     Status: None   Collection Time: 10/14/18  1:02 PM   Specimen: Nasopharyngeal Swab  Result Value Ref Range   SARS Coronavirus 2 NEGATIVE NEGATIVE    Comment: (NOTE) If result is NEGATIVE SARS-CoV-2 target nucleic acids are NOT DETECTED. The SARS-CoV-2 RNA is generally detectable in upper and lower  respiratory specimens during the acute phase of infection. The lowest  concentration of SARS-CoV-2 viral copies this assay can detect is 250  copies / mL. A negative result does not preclude SARS-CoV-2 infection  and should not be used as the sole  basis for treatment or other  patient management decisions.  A negative result may occur with  improper specimen collection / handling, submission of specimen other  than nasopharyngeal swab, presence of viral mutation(s) within the  areas targeted by this assay, and inadequate number of viral copies  (<250 copies / mL). A negative result must be combined with clinical  observations, patient history, and epidemiological information. If result is POSITIVE SARS-CoV-2 target nucleic acids are DETECTED. The SARS-CoV-2 RNA is generally detectable in upper and lower  respiratory specimens dur ing the acute phase of infection.  Positive  results are indicative of active infection with SARS-CoV-2.  Clinical  correlation with patient history and other diagnostic information is  necessary to determine patient infection status.  Positive results do  not rule out bacterial infection or co-infection with other viruses. If result is PRESUMPTIVE POSTIVE SARS-CoV-2 nucleic acids MAY BE PRESENT.   A presumptive positive result was obtained on the submitted specimen  and confirmed on repeat testing.  While 2019 novel coronavirus  (SARS-CoV-2) nucleic acids may be present in the submitted sample  additional confirmatory testing may be necessary for epidemiological  and / or clinical management purposes  to differentiate between  SARS-CoV-2 and other Sarbecovirus currently known to infect humans.  If clinically indicated additional testing with an alternate test  methodology [phone]) is advised. The SARS-CoV-2 RNA is generally  detectable in upper and lower respiratory sp ecimens during the acute  phase of infection. The expected result is Negative. Fact Sheet for Patients:  StrictlyIdeas.no Fact Sheet for Healthcare Providers: BankingDealers.co.za This test is not yet approved or cleared by the [place] FDA and has been authorized for detection  and/or diagnosis of SARS-CoV-2 by FDA under an Emergency Use Authorization (EUA).  This EUA will remain in effect (meaning this test can be used) for the duration of the COVID-19 declaration under Section 564(b)(1) of the Act, 21 U.S.C. section 360bbb-3(b)(1), unless the authorization is terminated or revoked sooner. Performed at [facility], [place] [address], [place], [place] [address]   Comprehensive metabolic panel     Status: Abnormal   Collection Time: 10/14/18  1:05 PM  Result Value Ref Range   Sodium 139 135 - 145 mmol/L   Potassium 5.3 (H) 3.5 - 5.1 mmol/L   Chloride 103 98 - 111 mmol/L   CO2 24 22 - 32 mmol/L   Glucose, Bld 145 (H) 70 - 99 mg/dL   BUN 67 (H) 8 - 23 mg/dL   Creatinine, Ser 1.72 (H) 0.61 - 1.24 mg/dL   Calcium 9.0 8.9 - 10.3 mg/dL   Total Protein 6.4 (L) 6.5 - 8.1 g/dL   Albumin 3.6 3.5 - 5.0 g/dL   AST  27 15 - 41 U/L   ALT 30 0 - 44 U/L   Alkaline Phosphatase 48 38 - 126 U/L   Total Bilirubin 1.0 0.3 - 1.2 mg/dL   GFR calc non Af Amer 34 (L) >60 mL/min   GFR calc Af Amer 39 (L) >60 mL/min   Anion gap 12 5 - 15    Comment: Performed at [facility], [place] [address] [place], [place] [address]  CBC with Differential     Status: Abnormal   Collection Time: 10/14/18  1:05 PM  Result Value Ref Range   WBC 5.2 4.0 - 10.5 K/uL   RBC 3.74 (L) 4.22 - 5.81 MIL/uL   Hemoglobin 9.9 (L) 13.0 - 17.0 g/dL   HCT 32.2 (L) 39.0 - 52.0 %   MCV 86.1 80.0 - 100.0 fL   MCH 26.5 26.0 - 34.0 pg   MCHC 30.7 30.0 - 36.0 g/dL   RDW 17.7 (H) 11.5 - 15.5 %   Platelets 82 (L) 150 - 400 K/uL    Comment: SPECIMEN CHECKED FOR CLOTS Immature Platelet Fraction may be clinically indicated, consider ordering this additional test [id number] CONSISTENT WITH PREVIOUS RESULT    nRBC 1.5 (H) 0.0 - 0.2 %   Neutrophils Relative % 88 %   Neutro Abs 4.6 1.7 - 7.7 K/uL   Lymphocytes Relative 6 %   Lymphs Abs 0.3 (L) 0.7 - 4.0 K/uL   Monocytes Relative  5 %   Monocytes Absolute 0.3 0.1 - 1.0 K/uL   Eosinophils Relative 0 %   Eosinophils Absolute 0.0 0.0 - 0.5 K/uL   Basophils Relative 0 %   Basophils Absolute 0.0 0.0 - 0.1 K/uL   Immature Granulocytes 1 %   Abs Immature Granulocytes 0.06 0.00 - 0.07 K/uL    Comment: Performed at [facility], [place] [address] [place], [place] [address]  Troponin I (High Sensitivity)     Status: Abnormal   Collection Time: 10/14/18  1:05 PM  Result Value Ref Range   Troponin I (High Sensitivity) 236 (HH) <18 ng/L    Comment: CRITICAL RESULT CALLED TO, READ BACK BY AND VERIFIED WITH: CALLED TO SHEILA B, RN ON 10/14/18 AT 1356 BY LE (NOTE) Elevated high sensitivity troponin I (hsTnI) values and significant  changes across serial measurements may suggest ACS but many other  chronic and acute conditions are known to elevate hsTnI results.  Refer to the Links section for chest pain algorithms and additional  guidance. Performed at [facility], [place] [address] [place], [place] [address]   Troponin I (High Sensitivity)     Status: Abnormal   Collection Time: 10/14/18  2:37 PM  Result Value Ref Range   Troponin I (High Sensitivity) 1,048 (HH) <18 ng/L    Comment: CRITICAL RESULT CALLED TO, READ BACK BY AND VERIFIED WITH: Z.Caliber Landess AT [id number] ON 10/14/18 BY N.THOMPSON DELTA CHECK NOTED (NOTE) Elevated high sensitivity troponin I (hsTnI) values and significant  changes across serial measurements may suggest ACS but many other  chronic and acute conditions are known to elevate hsTnI results.  Refer to the Links section for chest pain algorithms and additional  guidance. Performed at [facility], [place] [address], [place], [place] [address]   Troponin I (High Sensitivity)     Status: Abnormal   Collection Time: 10/14/18  6:05 PM  Result Value Ref Range   Troponin I (High Sensitivity) 3,613 (HH) <18 ng/L    Comment: DELTA CHECK  NOTED CRITICAL RESULT  CALLED TO, READ BACK BY AND VERIFIED WITH: J.NASH AT 1904 ON 10/14/18 BY N.THOMPSON (NOTE) Elevated high sensitivity troponin I (hsTnI) values and significant  changes across serial measurements may suggest ACS but many other  chronic and acute conditions are known to elevate hsTnI results.  Refer to the Links section for chest pain algorithms and additional  guidance. Performed at [facility], [place] [address], [place], [place] [address]   Troponin I (High Sensitivity)     Status: Abnormal   Collection Time: 10/14/18  7:29 PM  Result Value Ref Range   Troponin I (High Sensitivity) 4,024 (HH) <18 ng/L    Comment: DELTA CHECK NOTED CRITICAL RESULT CALLED TO, READ BACK BY AND VERIFIED WITH: J.NASH AT 2229 ON 10/14/18 BY N.THOMPSON (NOTE) Elevated high sensitivity troponin I (hsTnI) values and significant  changes across serial measurements may suggest ACS but many other  chronic and acute conditions are known to elevate hsTnI results.  Refer to the Links section for chest pain algorithms and additional  guidance. Performed at [facility], [place] [address], [place], [place] [address]   CK total and CKMB (cardiac)not at [facility]     Status: Abnormal   Collection Time: 10/14/18  8:30 PM  Result Value Ref Range   Total CK 109 49 - 397 U/L   CK, MB 19.7 (H) 0.5 - 5.0 ng/mL   Relative Index 18.1 (H) 0.0 - 2.5    Comment: Performed at [place] [address] [place], [place] [address]  CBG monitoring, ED     Status: None   Collection Time: 10/14/18  8:52 PM  Result Value Ref Range   Glucose-Capillary 95 70 - 99 mg/dL  CBG monitoring, ED     Status: None   Collection Time: 10/14/18 11:44 PM  Result Value Ref Range   Glucose-Capillary 75 70 - 99 mg/dL   Dg Chest Portable 1 View  Result Date: 10/14/2018 CLINICAL DATA:  Increasing shortness of breath. EXAM: PORTABLE CHEST 1 VIEW COMPARISON:  September 15, 2018 FINDINGS: Stable position of cardiac  pacemaker. Cardiomediastinal silhouette is normal. Mediastinal contours appear intact. Tortuosity and calcific atherosclerotic disease of the aorta. Bilateral layering pleural effusions with mild interstitial pulmonary edema. Osseous structures are without acute abnormality. Soft tissues are grossly normal. IMPRESSION: Bilateral layering pleural effusions with mild interstitial pulmonary edema. Electronically Signed   By: Fidela Salisbury M.D.   On: 10/14/2018 13:46    Pending Labs Unresulted Labs (From admission, onward)    Start     Ordered   10/15/18 [id number]  Basic metabolic panel  Daily,   R     10/14/18 1959   10/15/18 0500  CBC  Daily,   R     10/14/18 1744   10/15/18 0500  Hemoglobin A1c  Tomorrow morning,   R     10/14/18 1959   10/15/18 0500  Lipid panel  Tomorrow morning,   R     10/14/18 1959   10/15/18 0200  Heparin level (unfractionated)  Once-Timed,   STAT     10/14/18 1744          Vitals/Pain Today's Vitals   10/14/18 2100 10/14/18 2115 10/14/18 2130 10/14/18 2353  BP: 96/82 109/83 118/78 101/83  Pulse:   77 85  Resp: 16 18 (!) 25 (!) 27  TempSrc:      SpO2:   92% 100%  Weight:      PainSc:  Isolation Precautions No active isolations  Medications Medications  trimethoprim (TRIMPEX) tablet 100 mg (100 mg Oral Given 10/14/18 2101)  nitroGLYCERIN (NITROSTAT) SL tablet 0.4 mg (has no administration in time range)  rosuvastatin (CRESTOR) tablet 5 mg (5 mg Oral Given 10/14/18 2101)  ALPRAZolam (XANAX) tablet 0.25 mg (has no administration in time range)  pantoprazole (PROTONIX) EC tablet 40 mg (40 mg Oral Given 10/14/18 2102)  ferrous sulfate tablet 325 mg (has no administration in time range)  vitamin B-12 (CYANOCOBALAMIN) tablet 1,000 mcg (1,000 mcg Oral Given 10/14/18 2101)  vitamin C (ASCORBIC ACID) tablet 250 mg (250 mg Oral Given 10/14/18 2101)  sodium chloride flush (NS) 0.9 % injection 3 mL (has no administration in time range)  sodium chloride flush  (NS) 0.9 % injection 3 mL (has no administration in time range)  0.9 %  sodium chloride infusion (has no administration in time range)  acetaminophen (TYLENOL) tablet 650 mg (has no administration in time range)  ondansetron (ZOFRAN) injection 4 mg (has no administration in time range)  furosemide (LASIX) injection 60 mg (60 mg Intravenous Given 10/14/18 2111)  aspirin EC tablet 81 mg (81 mg Oral Given 10/14/18 2102)  heparin ADULT infusion 100 units/mL (25000 units/277mL sodium chloride 0.45%) (800 Units/hr Intravenous New Bag/Given 10/14/18 1814)  insulin aspart (novoLOG) injection 0-9 Units (0 Units Subcutaneous Not Given 10/14/18 2055)  furosemide (LASIX) injection 60 mg (60 mg Intravenous Given 10/14/18 1257)  albuterol (VENTOLIN HFA) 108 (90 Base) MCG/ACT inhaler 6 puff (6 puffs Inhalation Given 10/14/18 1259)  aspirin tablet 325 mg (325 mg Oral Given 10/14/18 1744)  heparin bolus via infusion 3,000 Units (3,000 Units Intravenous Bolus from Bag 10/14/18 1817)    Mobility non-ambulatory Low fall risk   Focused Assessments Cardiac Assessment Handoff:    Lab Results  Component Value Date   CKTOTAL 109 10/14/2018   CKMB 19.7 (H) 10/14/2018   TROPONINI 0.03 (HH) 11/27/2016   Lab Results  Component Value Date   DDIMER (H) 04/19/2009    3.48        AT THE INHOUSE ESTABLISHED CUTOFF VALUE OF 0.48 ug/mL FEU, THIS ASSAY HAS BEEN DOCUMENTED IN THE LITERATURE TO HAVE A SENSITIVITY AND NEGATIVE PREDICTIVE VALUE OF AT LEAST 98 TO 99%.  THE TEST RESULT SHOULD BE CORRELATED WITH AN ASSESSMENT OF THE CLINICAL PROBABILITY OF DVT / VTE.   Does the Patient currently have chest pain? No  , Pulmonary Assessment Handoff:  Lung sounds: Bilateral Breath Sounds: Diminished O2 Device: Room Air O2 Flow Rate (L/min): 2 L/min      R Recommendations: See Admitting Provider Note  Report given to:   Additional Notes:

## 2018-10-15 NOTE — ED Notes (Signed)
[facility] contacted 

## 2018-10-15 NOTE — Progress Notes (Signed)
ANTICOAGULATION CONSULT NOTE - Follow Up Consult  Pharmacy Consult for heparin Indication: chest pain/ACS  Labs: Recent Labs    10/14/18 0932  10/14/18 1305 10/14/18 1437 10/14/18 1805 10/14/18 1929 10/14/18 2030 10/15/18 0318  HGB 9.3*  --  9.9*  --   --   --   --   --   HCT 29.7*  --  32.2*  --   --   --   --   --   PLT 71*  --  82*  --   --   --   --   --   HEPARINUNFRC  --   --   --   --   --   --   --  0.56  CREATININE  --   --  1.72*  --   --   --   --   --   CKTOTAL  --   --   --   --   --   --  109  --   CKMB  --   --   --   --   --   --  19.7*  --   TROPONINIHS  --    < > 236* 1,048* 3,613* 4,024*  --   --    < > = values in this interval not displayed.    Assessment/Plan:  85yo male therapeutic on heparin with initial dosing for ACS. Will continue gtt at current rate and confirm stable with additional level.   Wynona Neat, PharmD, BCPS  10/15/2018,4:12 AM

## 2018-10-15 NOTE — Progress Notes (Signed)
  Echocardiogram 2D Echocardiogram has been performed.  Johny Chess 10/15/2018, 2:30 PM

## 2018-10-15 NOTE — Progress Notes (Signed)
CRITICAL VALUE ALERT  Critical Value:  Troponin 2858  Date & Time Notied:  10/15/2018 0815  Provider Notified: Curly Shores PA 0820  Orders Received/Actions taken: Pt complaining of chest tightness. 0.4 nitro given. PA paged and orders to keep pt NPO. Clyde Canterbury, RN

## 2018-10-15 NOTE — Progress Notes (Addendum)
Received patient via stretcher transported by [facility]. Transferred to bed from streatcher. Bi pap continued

## 2018-10-15 NOTE — Consult Note (Addendum)
The patient has been seen in conjunction with Daune Perch, NP-C. All aspects of care have been considered and discussed. The patient has been personally interviewed, examined, and all clinical data has been reviewed.   I agree with the contents of this note and the recommendations.  I furthermore feel that the patient's prognosis is extremely poor.  He is not a candidate for invasive procedures.  IV diuresis to help remove any pulmonary decongestion is appropriate.  Blood pressure is very soft and he is developing azotemia, therefore further diuresis seems inappropriate.  A 2D Doppler echocardiogram will be helpful in differentiating diastolic left heart failure versus cor pulmonale.  After spending significant time with the patient and his daughter, Palliative care consult  Hospice consultation are recommended.  The patient would love to have family with him in his last hours (when that occurred).  I do not know that death is imminent but it does make sense to arrange hospice/palliative care within an approach to keep him at home rather than have recurrent hospitalization.  Greater than 50% of the time during this hospital encounter was spent in education, counseling, and coordination of care related to underlying disease process, diagnostic testing, potential treatment options, and end-of-life planning.     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 Denton Brick.  History of Present  Illness:   Mr. Devan has a history of prolonged out of hospital cardiac arrest with ventricular fibrillation in 2011.  He was found to have CAD with stents in the RCA/circumflex and ramus intermedius.  EF was 31% with large area of scar by MRI.  [place] was subsequently placed.  Patient always has some breathing issues and barium swallow was positive for presbyesophagus.  He also has COPD and uses oxygen at night. He is being treated for multiple myeloma and anemia with pancytopenia, requiring transfusions.  Plavix was stopped due to hematologic issues.  The patient is resting in the bed with BiPAP in place.  It is very difficult to communicate with the patient due to his hearing difficulties.  We are using pen and paper to communicate written messages.  The patient denies any chest pain.  His complaint is shortness of breath.  He says that he just wants to have his procedure done so that he can get something to eat and drink.   His daughter arrives in the room during my evaluation.  She says that the patient is now nonambulatory, using a wheelchair to get around in the home.  He now has 24-hour care hired since 3 weeks ago.  The patient has been too short of breath to get up and do his activities of daily living.  He has to self cath related to prostate issues.  His daughter says that he did have increased pedal edema but he refuses to take Lasix because it increases his urination and causes him to have to self cath more frequently.  She says that his  increasing shortness of breath over the last couple of months was suspected to be in part related to anemia.  She says he received therapy with [name] about 2 months ago, however all of his blood levels got very low so this was stopped.  She says he is is transfused when his hemoglobin gets below 8, with transfusions about every couple of weeks.  His daughter says that he takes a Xanax every night at home.  Heart Pathway Score:     Past  Medical History:  Diagnosis Date  . Anemia, unspecified   . CAD (coronary artery disease)   . Cataracts, bilateral   . CHF (congestive heart failure) ([place])   . Chronic airway obstruction, not elsewhere classified    on o2 at night  . Diabetes mellitus   . Diverticulosis of colon (without mention of hemorrhage)   . Dyslipidemia   . Emphysema   . Hemorrhoids   . ICD (implantable cardiac defibrillator) in place    st jude  . Ischemic cardiomyopathy   . Macular degeneration   . MI, old 2010 or 2011  . Prostatic hypertrophy    hx of  . Retention of urine, unspecified   . Unspecified disorder resulting from impaired renal function     Past Surgical History:  Procedure Laterality Date  . CARDIAC DEFIBRILLATOR PLACEMENT  2011   [facility] jude  . CATARACT EXTRACTION    . PACEMAKER INSERTION  2011  . pci     4/11  . TONSILLECTOMY  85 yo     Home Medications:  Prior to Admission medications   Medication Sig Start Date End Date Taking? Authorizing Provider  albuterol (PROVENTIL) (2.5 MG/3ML) 0.083% nebulizer solution Take 3 mLs (2.5 mg total) every 2 (two) hours as needed by nebulization for wheezing or shortness of breath. 12/05/16  Yes Ghimire, Henreitta Leber, MD  ALPRAZolam Duanne Moron) 0.25 MG tablet Take 1 tablet (0.25 mg total) by mouth at bedtime as needed for sleep. 09/11/17  Yes Ladell Pier, MD  arformoterol (BROVANA) 15 MCG/2ML NEBU Take 15 mcg by nebulization 2 (two) times daily.   Yes [provider]  budesonide (PULMICORT) 0.5 MG/2ML nebulizer solution Take 2 mLs (0.5 mg total) by nebulization 2 (two) times daily. 08/31/12  Yes Parrett, Tammy S, NP  ferrous sulfate 325 (65 FE) MG tablet Take 1 tablet (325 mg total) by mouth 2 (two) times daily with a meal. 11/14/16  Yes Ollis, Brandi L, NP  guaiFENesin (MUCINEX) 600 MG 12 hr tablet Take 600 mg by mouth daily.    Yes [provider]  metoprolol tartrate (LOPRESSOR) 25 MG tablet Take 12.5 mg by mouth 2 (two) times daily.  Reported on 06/06/2015   Yes [provider]  pantoprazole (PROTONIX) 40 MG tablet Take 1 tablet (40 mg total) by mouth daily. Patient taking differently: Take 40 mg by mouth 2 (two) times daily.  11/15/16  Yes Ollis, Brandi L, NP  rosuvastatin (CRESTOR) 5 MG tablet Take 1 tablet (5 mg total) by mouth daily. 10/12/17  Yes Josue Hector, MD  trimethoprim (TRIMPEX) 100 MG tablet Take 100 mg by mouth daily.  08/13/18  Yes [provider]  vitamin B-12 (CYANOCOBALAMIN) 1000 MCG tablet Take 1,000 mcg by mouth daily.   Yes [provider]  vitamin C (ASCORBIC ACID) 250 MG tablet Take 250 mg by mouth daily.   Yes [provider]  albuterol (PROAIR HFA) 108 (90 Base) MCG/ACT inhaler Inhale 2 puffs into the lungs  every 4 (four) hours as needed for wheezing or shortness of breath. Patient not taking: Reported on 10/14/2018 12/26/16   Parrett, Fonnie Mu, NP  ciprofloxacin (CIPRO) 500 MG tablet Take 1 tablet (500 mg total) by mouth 2 (two) times daily. Patient not taking: Reported on 10/14/2018 08/13/18   Ladell Pier, MD  furosemide (LASIX) 20 MG tablet Take 1 tablet (20 mg total) by mouth daily as needed for edema. Patient not taking: Reported on 10/14/2018 11/25/17   Josue Hector, MD  loratadine (CLARITIN) 10 MG tablet Take 1 tablet (10 mg total) by mouth daily. Patient not taking: Reported on 10/14/2018 02/06/17   Barton Dubois, MD  methylPREDNISolone (MEDROL DOSEPAK) 4 MG TBPK tablet 6 x 1 day, 5 x 1 day, 4 x 1 day, 3 x 1 day, 2 x 1 day, 1 x 1 day Patient not taking: Reported on 10/14/2018 09/28/18   Harle Stanford., PA-C  nitroGLYCERIN (NITROSTAT) 0.4 MG SL tablet Place 1 tablet (0.4 mg total) under the tongue every 5 (five) minutes as needed for chest pain. UP TO 3 DOSES THEN CALL EMS 11/02/16   Domenic Polite, MD  ondansetron (ZOFRAN) 8 MG tablet Take 0.5-1 tablets (4-8 mg total) by mouth every 8 (eight) hours as needed for nausea or vomiting. Take 65m 30-60 min prior to  melphalan. Patient not taking: Reported on 10/14/2018 08/10/18   SLadell Pier MD  OXYGEN Inhale 2 L into the lungs at bedtime.     [provider]  predniSONE (DELTASONE) 20 MG tablet Take 2 tablets (40 mg total) by mouth daily with breakfast. Take 4 days on, 24 days off, repeat every 28 days Patient not taking: Reported on 10/14/2018 08/10/18   SLadell Pier MD  predniSONE (DELTASONE) 20 MG tablet Take 462mdaily x 3, 303maily x 3, 20 mg daily x 3, 10 mg daily x 3 then stop Patient not taking: Reported on 10/14/2018 10/04/18   ManMarshell GarfinkelD  sodium chloride (OCEAN) 0.65 % SOLN nasal spray Place 2 sprays into both nostrils 2 (two) times daily. Patient not taking: Reported on 10/14/2018 11/14/16   OllDonita BrooksP    Inpatient Medications: Scheduled Meds: . aspirin EC  81 mg Oral Daily  . ferrous sulfate  325 mg Oral BID WC  . furosemide  60 mg Intravenous BID  . insulin aspart  0-9 Units Subcutaneous Q4H  . pantoprazole  40 mg Oral Daily  . rosuvastatin  5 mg Oral Daily  . sodium chloride flush  3 mL Intravenous Q12H  . trimethoprim  100 mg Oral Daily  . vitamin B-12  1,000 mcg Oral Daily  . vitamin C  250 mg Oral Daily   Continuous Infusions: . sodium chloride    . heparin 800 Units/hr (10/14/18 1814)   PRN Meds: sodium chloride, acetaminophen, ALPRAZolam, nitroGLYCERIN, ondansetron (ZOFRAN) IV, sodium chloride flush  Allergies:   No Known Allergies  Social History:   Social History   Socioeconomic History  . Marital status: Widowed    Spouse name: Not on file  . Number of children: Not on file  . Years of education: Not on file  . Highest education level: Not on file  Occupational History  . Occupation: denEngineer, [employer] [place] [employer] resource strain: Not on file  . Food insecurity    Worry: Not on file    Inability: Not on file  . Transportation needs  Medical: Not on file    Non-medical: Not on file   Tobacco Use  . Smoking status: Former Smoker    Types: Cigarettes    Quit date: 04/27/2009    Years since quitting: 9.4  . Smokeless tobacco: Never Used  Substance and Sexual Activity  . Alcohol use: Yes    Comment: occasional beer  . Drug use: No  . Sexual activity: Never  Lifestyle  . Physical activity    Days per week: Not on file    Minutes per session: Not on file  . Stress: Not on file  Relationships  . Social Herbalist on phone: Not on file    Gets together: Not on file    Attends religious service: Not on file    Active member of club or organization: Not on file    Attends meetings of clubs or organizations: Not on file    Relationship status: Not on file  . Intimate partner violence    Fear of current or ex partner: Not on file    Emotionally abused: Not on file    Physically abused: Not on file    Forced sexual activity: Not on file  Other Topics Concern  . Not on file  Social History Narrative   Lives in a house with stairs, which he needs to use, with his wife.  Wife is not able-bodied.  Does not use a cane or walker, but is unsteady on his feet.        [name]:  [phone] cell, [phone], daughter, POA.     Boyd Kerbs:  [phone], nephew   Juluis Pitch:  [phone], daughter          Family History:     Family History  Problem Relation Age of Onset  . Coronary artery disease Mother   . Stroke Mother   . Congestive Heart Failure Mother   . Lung cancer Father        smoker  . Breast cancer Sister   . Atrial fibrillation Daughter   . Heart attack Brother      ROS:  Please see the history of present illness.   All other ROS reviewed and negative.     Physical Exam/Data:   Vitals:   10/15/18 0802 10/15/18 0830 10/15/18 0831 10/15/18 0844  BP: 111/90 (!) 90/50 (!) 111/58   Pulse: 83 (!) 51  71  Resp: 19 (!) 29 (!) 26 15  Temp: 98.1 F (36.7 C)     TempSrc: Axillary     SpO2: 100% 100% 100% 100%  Weight:       Height:        Intake/Output Summary (Last 24 hours) at 10/15/2018 1000 Last data filed at 10/15/2018 0814 Gross per 24 hour  Intake 0 ml  Output 2250 ml  Net -2250 ml   Last 3 Weights 10/15/2018 10/14/2018 10/01/2018  Weight (lbs) 141 lb 15.6 oz 143 lb 4.8 oz 146 lb 14.4 oz  Weight (kg) 64.4 kg 65 kg 66.633 kg     Body mass index is 19.26 kg/m.  General: Chronically ill-appearing male, in no acute distress, currently on BiPAP therapy.  Very hard of hearing and difficult to communicate with. HEENT: normal Lymph: no adenopathy Neck: + JVD Endocrine:  No thryomegaly Vascular: No carotid bruits; FA pulses 2+ bilaterally without bruits  Cardiac:  normal S1, S2; RRR; no murmur  Lungs:  clear to auscultation bilaterally, no wheezing, few rales in posterior bases Abd:  soft, nontender, no hepatomegaly  Ext: 1-2+ bilateral lower leg edema, up to sock line Musculoskeletal: Generalized weakness Skin: warm and dry  Neuro: No focal deficits except very hard of hearing Psych:  Normal affect   EKG:  The EKG was personally reviewed and demonstrates:  Sinus rhythm with PVCs, Nonspecific intraventricular conduction delay, Borderline repolarization abnormality. Mild T wave inversions in V3-V6 Telemetry:  Telemetry was personally reviewed and demonstrates: Sinus rhythm in the 70s- 100 with PVCs  Relevant CV Studies:  Echocardiogram 12/02/2017 Study Conclusions  - Left ventricle: The cavity size was normal. There was moderate   focal basal hypertrophy. Systolic function was normal. The   estimated ejection fraction was in the range of 55% to 60%. Wall   motion was normal; there were no regional wall motion   abnormalities. There was an increased relative contribution of   atrial contraction to ventricular filling. Doppler parameters are   consistent with abnormal left ventricular relaxation (grade 1   diastolic dysfunction). Doppler parameters are consistent with   high ventricular filling  pressure. - Aortic valve: Trileaflet; normal thickness, mildly calcified   leaflets. There was mild stenosis. There was trivial   regurgitation. Valve area (VTI): 1.53 cm^2. Valve area (Vmax):   1.19 cm^2. Valve area (Vmean): 1.29 cm^2. - Mitral valve: Severely calcified annulus. Mild focal   calcification of the anterior leaflet. There was trivial   regurgitation. - Left atrium: The atrium was mildly dilated. - Right ventricle: Pacer wire or catheter noted in right ventricle. - Right atrium: Pacer wire or catheter noted in right atrium. - Tricuspid valve: There was mild regurgitation.   Laboratory Data:  High Sensitivity Troponin:   Recent Labs  Lab 10/14/18 1305 10/14/18 1437 10/14/18 1805 10/14/18 1929 10/15/18 0652  TROPONINIHS 236* 1,048* 3,613* 4,024* 2,858*     Chemistry Recent Labs  Lab 10/14/18 1305  NA 139  K 5.3*  CL 103  CO2 24  GLUCOSE 145*  BUN 67*  CREATININE 1.72*  CALCIUM 9.0  GFRNONAA 34*  GFRAA 39*  ANIONGAP 12    Recent Labs  Lab 10/14/18 1305  PROT 6.4*  ALBUMIN 3.6  AST 27  ALT 30  ALKPHOS 48  BILITOT 1.0   Hematology Recent Labs  Lab 10/14/18 0932 10/14/18 1305  WBC 4.4 5.2  RBC 3.59* 3.74*  HGB 9.3* 9.9*  HCT 29.7* 32.2*  MCV 82.7 86.1  MCH 25.9* 26.5  MCHC 31.3 30.7  RDW 17.5* 17.7*  PLT 71* 82*   BNP Recent Labs  Lab 10/14/18 0933 10/14/18 1302  BNP 1,596.5* 1,397.7*    DDimer No results for input(s): DDIMER in the last 168 hours.   Radiology/Studies:  Dg Chest Portable 1 View  Result Date: 10/14/2018 CLINICAL DATA:  Increasing shortness of breath. EXAM: PORTABLE CHEST 1 VIEW COMPARISON:  September 15, 2018 FINDINGS: Stable position of cardiac pacemaker. Cardiomediastinal silhouette is normal. Mediastinal contours appear intact. Tortuosity and calcific atherosclerotic disease of the aorta. Bilateral layering pleural effusions with mild interstitial pulmonary edema. Osseous structures are without acute abnormality.  Soft tissues are grossly normal. IMPRESSION: Bilateral layering pleural effusions with mild interstitial pulmonary edema. Electronically Signed   By: Fidela Salisbury M.D.   On: 10/14/2018 13:46    Assessment and Plan:   Elevated troponin/NSTEMI -Patient with history of V. fib cardiac arrest and DES x3 in 2011. Plavix was stopped in setting of anemia, multiple myeloma. -Patient presents for worsening dyspnea over the prior month, from the oncology office.  Patient had not been taking his Lasix due to frequent urination (he has to self cath).  Family reported noncompliance with most of his medications. -Patient denies any chest pain/pressure.  His biggest issue is shortness of breath which is likely multifactorial considering his anemia, COPD and now volume overload. -High-sensitivity troponins rose from 1048 to 4024, now trending down, 2858 this morning.  -Beta-blocker is on hold in the setting of hypotension.  Blood pressures 90s-low 100s. -IV heparin is infusing.  -Patient is a partial code, no CPR or shocks, intubation.  May use pressors if needed for low blood pressure. -Elevated troponin could be related to myocardial ischemia, but also could be demand ischemia in the setting of heart failure with underlying anemia. -Patient is not an ideal candidate for cardiac catheterization with intervention as he has myeloma with pancytopenia with anemia and thrombocytopenia.  Not sure if he would be able to sustain Plavix therapy.  He also has some acute kidney injury.  He remains on BiPAP therapy and I am not sure he would be able to lie on the cath table. -Check echo for LV function and wall motion. -I favor continuing to treat heart failure with volume overload and reevaluate tomorrow.  I will discuss plans with Dr. Tamala Julian.  Acute on chronic combined systolic and diastolic heart failure -EF down to 31% in 2011 after V. fib cardiac arrest.  Last echo in 11/2017 with EF 17-79%, grade 1 diastolic  dysfunction. -Only home HF medication was metoprolol 12.5 mg twice daily and lasix 20 mg as needed, which he had not been taking.  -Chest x-ray with bilateral layering pleural effusions with mild interstitial pulmonary edema. (Small bilateral pleural effusions noted on chest CT on 09/27/2018). -BNP 1596 (was 1073 on 09/27/2018) -The patient presented with shortness of breath and has JVD and lower leg edema. -Patient is currently diuresing with Lasix 60 mg IV twice daily, has received 3 doses.  Good urine output with 2.2 L yesterday.  Weight is down just over a pound. -Continue current diuresis.   AKI on CKD stage III -Baseline creatinine appears to be about 1.05-1.3.  Serum creatinine on presentation was elevated at 1.72 with a potassium of 5.3. repeat labs pending. -Monitor closely with diuresis.  Acute on chronic dyspnea/COPD -Patient with worsening dyspnea over a couple of weeks from his baseline.  Had not been taking Lasix. -Likely multifactorial with anemia, heart failure, COPD. -Patient is now diuresing with IV Lasix and continues on BiPAP therapy. -On inhalers at home  History of V. fib cardiac arrest in 2011 -S/p Carlin ICD, followed by Dr. Rayann Heman  Hyperlipidemia -On rosuvastatin 5 mg daily  COPD -Uses oxygen at night.  On inhalers at home.  Myeloma -Followed by oncology with pancytopenia.  Per his daughter the patient received therapy with [name] about 2 months ago, however all of his blood levels got very low so this was stopped.  She says he is is transfused when his hemoglobin gets below 8, with transfusions about every couple of weeks. -WBC was down to 1.2 on 09/27/2018.  WBCs 5.2 on presentation, 3.5 today.  -Hemoglobin over the last month has been in the 8 range, 9.4 today. -Platelets been as low as 9,000 in the last month, up to 82,000 on presentation, 59,000 today.       For questions or updates, please contact [place] Please consult [email or web address] for  contact info under     Signed, Daune Perch, NP  10/15/2018 10:00 AM

## 2018-10-15 NOTE — Progress Notes (Signed)
Hypoglycemic Event  CBG: 67  Treatment: 4oz of apple juice  Symptoms: asymptomatic   Follow-up CBG: Time:1230 CBG Result:104  Possible Reasons for Event: pt NPO on bipap  Comments/MD notified: Dr. Jackolyn Confer

## 2018-10-16 DIAGNOSIS — Z515 Encounter for palliative care: Secondary | ICD-10-CM

## 2018-10-16 DIAGNOSIS — I5021 Acute systolic (congestive) heart failure: Secondary | ICD-10-CM

## 2018-10-16 DIAGNOSIS — Z7189 Other specified counseling: Secondary | ICD-10-CM

## 2018-10-16 LAB — GLUCOSE, CAPILLARY
Glucose-Capillary: 113 mg/dL — ABNORMAL HIGH (ref 70–99)
Glucose-Capillary: 133 mg/dL — ABNORMAL HIGH (ref 70–99)
Glucose-Capillary: 137 mg/dL — ABNORMAL HIGH (ref 70–99)
Glucose-Capillary: 80 mg/dL (ref 70–99)

## 2018-10-16 LAB — BASIC METABOLIC PANEL
Anion gap: 16 — ABNORMAL HIGH (ref 5–15)
BUN: 71 mg/dL — ABNORMAL HIGH (ref 8–23)
CO2: 25 mmol/L (ref 22–32)
Calcium: 8.3 mg/dL — ABNORMAL LOW (ref 8.9–10.3)
Chloride: 97 mmol/L — ABNORMAL LOW (ref 98–111)
Creatinine, Ser: 1.88 mg/dL — ABNORMAL HIGH (ref 0.61–1.24)
GFR calc Af Amer: 35 mL/min — ABNORMAL LOW (ref >60)
GFR calc non Af Amer: 31 mL/min — ABNORMAL LOW (ref >60)
Glucose, Bld: 84 mg/dL (ref 70–99)
Potassium: 3.7 mmol/L (ref 3.5–5.1)
Sodium: 138 mmol/L (ref 135–145)

## 2018-10-16 LAB — CBC
HCT: 27.1 % — ABNORMAL LOW (ref 39.0–52.0)
Hemoglobin: 8.5 g/dL — ABNORMAL LOW (ref 13.0–17.0)
MCH: 26.2 pg (ref 26.0–34.0)
MCHC: 31.4 g/dL (ref 30.0–36.0)
MCV: 83.6 fL (ref 80.0–100.0)
Platelets: 61 10*3/uL — ABNORMAL LOW (ref 150–400)
RBC: 3.24 MIL/uL — ABNORMAL LOW (ref 4.22–5.81)
RDW: 17.4 % — ABNORMAL HIGH (ref 11.5–15.5)
WBC: 3.4 10*3/uL — ABNORMAL LOW (ref 4.0–10.5)
nRBC: 2.1 % — ABNORMAL HIGH (ref 0.0–0.2)

## 2018-10-16 LAB — HEMOGLOBIN A1C
Hgb A1c MFr Bld: 5.9 % — ABNORMAL HIGH (ref 4.8–5.6)
Mean Plasma Glucose: 123 mg/dL

## 2018-10-16 LAB — HEPARIN LEVEL (UNFRACTIONATED): Heparin Unfractionated: 0.41 IU/mL (ref 0.30–0.70)

## 2018-10-16 MED ORDER — FUROSEMIDE 10 MG/ML IJ SOLN
40.0000 mg | Freq: Two times a day (BID) | INTRAMUSCULAR | Status: DC
  Administered 2018-10-16: 40 mg via INTRAVENOUS
  Filled 2018-10-16: qty 4

## 2018-10-16 MED ORDER — ALBUTEROL SULFATE (2.5 MG/3ML) 0.083% IN NEBU: 2.5000 mg | INHALATION_SOLUTION | RESPIRATORY_TRACT | Status: DC | PRN

## 2018-10-16 MED ORDER — ACETAMINOPHEN 650 MG RE SUPP: 650.0000 mg | Freq: Four times a day (QID) | RECTAL | 0 refills | Status: AC | PRN

## 2018-10-16 MED ORDER — MAGIC MOUTHWASH
15.0000 mL | Freq: Four times a day (QID) | ORAL | Status: DC | PRN
  Filled 2018-10-16: qty 15

## 2018-10-16 MED ORDER — LORAZEPAM 2 MG/ML IJ SOLN: 0.5000 mg | INTRAMUSCULAR | Status: DC | PRN

## 2018-10-16 MED ORDER — BIOTENE DRY MOUTH MT LIQD: 15.0000 mL | OROMUCOSAL | Status: DC | PRN

## 2018-10-16 MED ORDER — DIPHENHYDRAMINE HCL 50 MG/ML IJ SOLN: 12.5000 mg | INTRAMUSCULAR | Status: DC | PRN

## 2018-10-16 MED ORDER — LORAZEPAM 2 MG/ML IJ SOLN: 1.0000 mg | INTRAMUSCULAR | Status: DC | PRN

## 2018-10-16 MED ORDER — ACETAMINOPHEN 650 MG RE SUPP: 650.0000 mg | Freq: Four times a day (QID) | RECTAL | Status: DC | PRN

## 2018-10-16 MED ORDER — ACETAMINOPHEN 325 MG PO TABS: 650.0000 mg | ORAL_TABLET | Freq: Four times a day (QID) | ORAL | Status: DC | PRN

## 2018-10-16 MED ORDER — ALPRAZOLAM 0.25 MG PO TABS: 0.2500 mg | ORAL_TABLET | Freq: Three times a day (TID) | ORAL | Status: DC | PRN

## 2018-10-16 MED ORDER — ONDANSETRON HCL 4 MG/2ML IJ SOLN
4.0000 mg | INTRAMUSCULAR | Status: DC | PRN
  Administered 2018-10-16: 14:00:00 4 mg via INTRAVENOUS
  Filled 2018-10-16: qty 2

## 2018-10-16 MED ORDER — ONDANSETRON 4 MG PO TBDP: 4.0000 mg | ORAL_TABLET | Freq: Four times a day (QID) | ORAL | Status: DC | PRN

## 2018-10-16 MED ORDER — LORAZEPAM 2 MG/ML PO CONC: 1.0000 mg | ORAL | Status: DC | PRN

## 2018-10-16 MED ORDER — BIOTENE DRY MOUTH MT LIQD: 15.0000 mL | OROMUCOSAL | 0 refills | Status: AC | PRN

## 2018-10-16 MED ORDER — POLYVINYL ALCOHOL 1.4 % OP SOLN
1.0000 [drp] | Freq: Four times a day (QID) | OPHTHALMIC | Status: DC | PRN
  Filled 2018-10-16: qty 15

## 2018-10-16 MED ORDER — OLANZAPINE 5 MG PO TBDP: 5.0000 mg | ORAL_TABLET | Freq: Every day | ORAL | 0 refills | Status: AC

## 2018-10-16 MED ORDER — ONDANSETRON 4 MG PO TBDP: 4.0000 mg | ORAL_TABLET | ORAL | 0 refills | Status: AC | PRN

## 2018-10-16 MED ORDER — POLYVINYL ALCOHOL 1.4 % OP SOLN: 1.0000 [drp] | Freq: Four times a day (QID) | OPHTHALMIC | 0 refills | Status: AC | PRN

## 2018-10-16 MED ORDER — ALUM & MAG HYDROXIDE-SIMETH 200-200-20 MG/5ML PO SUSP: 30.0000 mL | Freq: Four times a day (QID) | ORAL | Status: DC | PRN

## 2018-10-16 MED ORDER — ATROPINE SULFATE 1 % OP SOLN: 4.0000 [drp] | OPHTHALMIC | 0 refills | Status: AC | PRN

## 2018-10-16 MED ORDER — MORPHINE SULFATE (PF) 2 MG/ML IV SOLN
2.0000 mg | INTRAVENOUS | Status: DC | PRN
  Administered 2018-10-16: 2 mg via INTRAVENOUS
  Filled 2018-10-16: qty 1

## 2018-10-16 MED ORDER — MORPHINE SULFATE (CONCENTRATE) 10 MG/0.5ML PO SOLN: 5.0000 mg | ORAL | Status: DC | PRN

## 2018-10-16 MED ORDER — MORPHINE SULFATE (CONCENTRATE) 10 MG/0.5ML PO SOLN: 5.0000 mg | ORAL | 0 refills | Status: AC | PRN

## 2018-10-16 MED ORDER — MORPHINE SULFATE (PF) 2 MG/ML IV SOLN
2.0000 mg | INTRAVENOUS | Status: DC | PRN
  Administered 2018-10-16: 18:00:00 2 mg via INTRAVENOUS
  Filled 2018-10-16 (×2): qty 1

## 2018-10-16 MED ORDER — ATROPINE SULFATE 1 % OP SOLN
4.0000 [drp] | OPHTHALMIC | Status: DC | PRN
  Filled 2018-10-16: qty 2

## 2018-10-16 MED ORDER — ONDANSETRON HCL 4 MG/2ML IJ SOLN: 4.0000 mg | Freq: Four times a day (QID) | INTRAMUSCULAR | Status: DC | PRN

## 2018-10-16 MED ORDER — LORAZEPAM 2 MG/ML PO CONC: 1.0000 mg | ORAL | 0 refills | Status: AC | PRN

## 2018-10-16 MED ORDER — LORAZEPAM 1 MG PO TABS: 1.0000 mg | ORAL_TABLET | ORAL | Status: DC | PRN

## 2018-10-16 MED ORDER — OLANZAPINE 5 MG PO TBDP
5.0000 mg | ORAL_TABLET | Freq: Every day | ORAL | Status: DC
  Administered 2018-10-16: 17:00:00 5 mg via ORAL
  Filled 2018-10-16: qty 1

## 2018-10-16 MED ORDER — SODIUM CHLORIDE 0.9 % IV SOLN
12.5000 mg | Freq: Four times a day (QID) | INTRAVENOUS | Status: DC | PRN
  Filled 2018-10-16: qty 0.5

## 2018-10-16 MED ORDER — ACETAMINOPHEN 650 MG RE SUPP: 650.0000 mg | RECTAL | Status: DC | PRN

## 2018-10-16 MED ORDER — LOPERAMIDE HCL 2 MG PO CAPS: 2.0000 mg | ORAL_CAPSULE | ORAL | Status: DC | PRN

## 2018-10-16 MED ORDER — BISACODYL 10 MG RE SUPP: 10.0000 mg | Freq: Every day | RECTAL | Status: DC | PRN

## 2018-10-16 MED ORDER — MORPHINE SULFATE 10 MG/5ML PO SOLN: 5.0000 mg | ORAL | Status: DC | PRN

## 2018-10-16 NOTE — Plan of Care (Signed)
Problem: Education: Goal: Knowledge of General Education information will improve Description: Including pain rating scale, medication(s)/side effects and non-pharmacologic comfort measures 10/16/2018 1808 by Glenard Haring, RN Outcome: Progressing 10/16/2018 1706 by Glenard Haring, RN Outcome: Adequate for Discharge   Problem: Health Behavior/Discharge Planning: Goal: Ability to manage health-related needs will improve 10/16/2018 1808 by Glenard Haring, RN Outcome: Progressing 10/16/2018 1706 by Glenard Haring, RN Outcome: Adequate for Discharge   Problem: Clinical Measurements: Goal: Ability to maintain clinical measurements within normal limits will improve 10/16/2018 1808 by Glenard Haring, RN Outcome: Progressing 10/16/2018 1706 by Glenard Haring, RN Outcome: Adequate for Discharge Goal: Will remain free from infection 10/16/2018 1808 by Glenard Haring, RN Outcome: Progressing 10/16/2018 1706 by Glenard Haring, RN Outcome: Adequate for Discharge Goal: Diagnostic test results will improve 10/16/2018 1808 by Glenard Haring, RN Outcome: Progressing 10/16/2018 1706 by Glenard Haring, RN Outcome: Adequate for Discharge Goal: Respiratory complications will improve 10/16/2018 1808 by Glenard Haring, RN Outcome: Progressing 10/16/2018 1706 by Glenard Haring, RN Outcome: Adequate for Discharge Goal: Cardiovascular complication will be avoided 10/16/2018 1808 by Glenard Haring, RN Outcome: Progressing 10/16/2018 1706 by Glenard Haring, RN Outcome: Adequate for Discharge   Problem: Activity: Goal: Risk for activity intolerance will decrease 10/16/2018 1808 by Glenard Haring, RN Outcome: Progressing 10/16/2018 1706 by Glenard Haring, RN Outcome: Adequate for Discharge   Problem: Nutrition: Goal: Adequate nutrition will be maintained 10/16/2018 1808 by Glenard Haring, RN Outcome: Progressing 10/16/2018 1706 by Glenard Haring,  RN Outcome: Adequate for Discharge   Problem: Coping: Goal: Level of anxiety will decrease 10/16/2018 1808 by Glenard Haring, RN Outcome: Progressing 10/16/2018 1706 by Glenard Haring, RN Outcome: Adequate for Discharge   Problem: Elimination: Goal: Will not experience complications related to bowel motility 10/16/2018 1808 by Glenard Haring, RN Outcome: Progressing 10/16/2018 1706 by Glenard Haring, RN Outcome: Adequate for Discharge Goal: Will not experience complications related to urinary retention 10/16/2018 1808 by Glenard Haring, RN Outcome: Progressing 10/16/2018 1706 by Glenard Haring, RN Outcome: Adequate for Discharge   Problem: Pain Managment: Goal: General experience of comfort will improve 10/16/2018 1808 by Glenard Haring, RN Outcome: Progressing 10/16/2018 1706 by Glenard Haring, RN Outcome: Adequate for Discharge   Problem: Safety: Goal: Ability to remain free from injury will improve 10/16/2018 1808 by Glenard Haring, RN Outcome: Progressing 10/16/2018 1706 by Glenard Haring, RN Outcome: Adequate for Discharge   Problem: Skin Integrity: Goal: Risk for impaired skin integrity will decrease 10/16/2018 1808 by Glenard Haring, RN Outcome: Progressing 10/16/2018 1706 by Glenard Haring, RN Outcome: Adequate for Discharge   Problem: Education: Goal: Understanding of cardiac disease, CV risk reduction, and recovery process will improve 10/16/2018 1808 by Glenard Haring, RN Outcome: Progressing 10/16/2018 1706 by Glenard Haring, RN Outcome: Adequate for Discharge Goal: Understanding of medication regimen will improve 10/16/2018 1808 by Glenard Haring, RN Outcome: Progressing 10/16/2018 1706 by Glenard Haring, RN Outcome: Adequate for Discharge Goal: Individualized Educational Video(s) 10/16/2018 1808 by Glenard Haring, RN Outcome: Progressing 10/16/2018 1706 by Glenard Haring, RN Outcome: Adequate for Discharge    Problem: Activity: Goal: Ability to tolerate increased activity will improve 10/16/2018 1808 by Glenard Haring, RN Outcome: Progressing 10/16/2018 1706 by Glenard Haring, RN Outcome: Adequate for Discharge   Problem: Cardiac: Goal: Ability to achieve and maintain adequate cardiopulmonary perfusion  will improve 10/16/2018 1808 by Glenard Haring, RN Outcome: Progressing 10/16/2018 1706 by Glenard Haring, RN Outcome: Adequate for Discharge Goal: Vascular access site(s) Level 0-1 will be maintained 10/16/2018 1808 by Glenard Haring, RN Outcome: Progressing 10/16/2018 1706 by Glenard Haring, RN Outcome: Adequate for Discharge   Problem: Health Behavior/Discharge Planning: Goal: Ability to safely manage health-related needs after discharge will improve 10/16/2018 1808 by Glenard Haring, RN Outcome: Progressing 10/16/2018 1706 by Glenard Haring, RN Outcome: Adequate for Discharge

## 2018-10-16 NOTE — Progress Notes (Signed)
Discharge instructions given to daughter, [name].  Discussed signs and symptoms of death and expectations.  Discussed medications and uses, new medications and side effects, as well as regular medications and when to use.  Discussed signs and symptoms to contact Hospice for and when.  Verbalized understanding.

## 2018-10-16 NOTE — Progress Notes (Signed)
Repeat CBG 137

## 2018-10-16 NOTE — Progress Notes (Signed)
Progress Note  Patient Name: [name], MD Date of Encounter: 10/16/2018  Primary Cardiologist: Jenkins Rouge, MD  Brief follow-up note, chart reviewed.  Please refer to full cardiology consultation from yesterday with recommendations made at that time.  Inpatient Medications    Scheduled Meds: . aspirin EC  81 mg Oral Daily  . ferrous sulfate  325 mg Oral BID WC  . furosemide  60 mg Intravenous BID  . influenza vaccine adjuvanted  0.5 mL Intramuscular Tomorrow-1000  . insulin aspart  0-9 Units Subcutaneous Q4H  . metoprolol tartrate  12.5 mg Oral BID  . pantoprazole  40 mg Oral Daily  . rosuvastatin  5 mg Oral Daily  . sodium chloride flush  3 mL Intravenous Q12H  . vitamin B-12  1,000 mcg Oral Daily  . vitamin C  250 mg Oral Daily    Labs    Chemistry Recent Labs  Lab 10/14/18 1305 10/15/18 1034 10/16/18 0253  NA 139 142 138  K 5.3* 3.9 3.7  CL 103 101 97*  CO2 24 27 25   GLUCOSE 145* 76 84  BUN 67* 71* 71*  CREATININE 1.72* 1.87* 1.88*  CALCIUM 9.0 8.9 8.3*  PROT 6.4*  --   --   ALBUMIN 3.6  --   --   AST 27  --   --   ALT 30  --   --   ALKPHOS 48  --   --   BILITOT 1.0  --   --   GFRNONAA 34* 31* 31*  GFRAA 39* 36* 35*  ANIONGAP 12 14 16*     Hematology Recent Labs  Lab 10/14/18 1305 10/15/18 0810 10/16/18 0253  WBC 5.2 3.5* 3.4*  RBC 3.74* 3.52* 3.24*  HGB 9.9* 9.4* 8.5*  HCT 32.2* 29.2* 27.1*  MCV 86.1 83.0 83.6  MCH 26.5 26.7 26.2  MCHC 30.7 32.2 31.4  RDW 17.7* 17.5* 17.4*  PLT 82* 59* 61*    Cardiac Enzymes Recent Labs  Lab 10/14/18 1305 10/14/18 1437 10/14/18 1805 10/14/18 1929 10/15/18 0652  TROPONINIHS 236* 1,048* 3,613* 4,024* 2,858*    BNP Recent Labs  Lab 10/14/18 0933 10/14/18 1302  BNP 1,596.5* 1,397.7*    Echocardiogram 10/15/2018:  1. Left ventricular ejection fraction, by visual estimation, is 25 to 30%. The left ventricle has severely decreased function. There is no left ventricular hypertrophy.  2.  Multiple segmental abnormalities exist. See findings.  3. Elevated mean left atrial pressure.  4. Left ventricular diastolic Doppler parameters are consistent with pseudonormalization pattern of LV diastolic filling.  5. Global right ventricle has normal systolic function.The right ventricular size is mildly enlarged. No increase in right ventricular wall thickness.  6. Left atrial size was mildly dilated.  7. Right atrial size was normal.  8. Trivial pericardial effusion is present.  9. Severe mitral annular calcification. 10. The mitral valve is degenerative. Mild mitral valve regurgitation. Mild-moderate mitral stenosis. 11. The tricuspid valve is not well visualized. Tricuspid valve regurgitation was not visualized by color flow Doppler. 12. The aortic valve is tricuspid Aortic valve regurgitation is trivial by color flow Doppler. severe thickening and calcification of the aortic valve leaflets. Gradients are "normal", underestimated due to poor systolic function. 13. The pulmonic valve was not well visualized. Pulmonic valve regurgitation is not visualized by color flow Doppler. 14. The aortic root was not well visualized. 15. TR signal is inadequate for assessing pulmonary artery systolic pressure. 16. A pacer wire is visualized. 17. The inferior vena cava  is normal in size with greater than 50% respiratory variability, suggesting right atrial pressure of 3 mmHg.  In comparison to the previous echocardiogram(s): There is marked worsening of global left ventricular systolic function due to interval infarction involving the entire LAD artery distribution. The hypokinesis in the left circumflex artery territory  appears unchanged,and wall motion in the right coronary territory is relatively normal. There is at least mild-moderate aortic stenosis, but the gradients are falsely low due to poor left ventricular function.  Discussion: Follow-up echocardiogram shows reduction in LVEF compared to  prior study in November 2019, now 25 to 30% with wall motion abnormalities consistent with ischemic cardiomyopathy.  There is also evidence of at least mild to moderate aortic stenosis.  As per Dr. Thompson Caul recommendations yesterday, overall prognosis is quite poor, further supported by his follow-up echocardiogram.  Would agree with palliative care consultation and medical therapy as tolerated.  No further invasive cardiac testing is planned and cardiology service will sign off for now.  Signed, Rozann Lesches, MD  10/16/2018, 8:45 AM

## 2018-10-16 NOTE — Plan of Care (Signed)
  Problem: Education: Goal: Knowledge of General Education information will improve Description: Including pain rating scale, medication(s)/side effects and non-pharmacologic comfort measures Outcome: Adequate for Discharge   Problem: Health Behavior/Discharge Planning: Goal: Ability to manage health-related needs will improve Outcome: Adequate for Discharge   Problem: Clinical Measurements: Goal: Ability to maintain clinical measurements within normal limits will improve Outcome: Adequate for Discharge Goal: Will remain free from infection Outcome: Adequate for Discharge Goal: Diagnostic test results will improve Outcome: Adequate for Discharge Goal: Respiratory complications will improve Outcome: Adequate for Discharge Goal: Cardiovascular complication will be avoided Outcome: Adequate for Discharge   Problem: Activity: Goal: Risk for activity intolerance will decrease Outcome: Adequate for Discharge   Problem: Nutrition: Goal: Adequate nutrition will be maintained Outcome: Adequate for Discharge   Problem: Coping: Goal: Level of anxiety will decrease Outcome: Adequate for Discharge   Problem: Elimination: Goal: Will not experience complications related to bowel motility Outcome: Adequate for Discharge Goal: Will not experience complications related to urinary retention Outcome: Adequate for Discharge   Problem: Pain Managment: Goal: General experience of comfort will improve Outcome: Adequate for Discharge   Problem: Safety: Goal: Ability to remain free from injury will improve Outcome: Adequate for Discharge   Problem: Skin Integrity: Goal: Risk for impaired skin integrity will decrease Outcome: Adequate for Discharge   Problem: Education: Goal: Understanding of cardiac disease, CV risk reduction, and recovery process will improve Outcome: Adequate for Discharge Goal: Understanding of medication regimen will improve Outcome: Adequate for Discharge Goal:  Individualized Educational Video(s) Outcome: Adequate for Discharge   Problem: Activity: Goal: Ability to tolerate increased activity will improve Outcome: Adequate for Discharge   Problem: Cardiac: Goal: Ability to achieve and maintain adequate cardiopulmonary perfusion will improve Outcome: Adequate for Discharge Goal: Vascular access site(s) Level 0-1 will be maintained Outcome: Adequate for Discharge   Problem: Health Behavior/Discharge Planning: Goal: Ability to safely manage health-related needs after discharge will improve Outcome: Adequate for Discharge

## 2018-10-16 NOTE — Consult Note (Signed)
Consultation Note Date: 10/16/2018   Patient Name: [name] Azure, MD  DOB: [birth year]  MRN: [id number]  Age / Sex: 85 y.o., male  PCP: [name], MD Referring Physician: Roxan Hockey, MD  Reason for Consultation: Terminal Care  HPI/Patient Profile: 85 y.o. male  admitted on 10/14/2018  [name] [name] is a 85 y.o. male, retired Pharmacist, community, with a hx of CAD s/p DES to RI, dLcx, RCA 2011, ICM/chronic systolic HF, cardiac arrest with vib s/p ICD 2011, HLD, COPD on 2.5L O2 at hs, CKD who presented to the hospital for the evaluation of NSTEMI.   Clinical Assessment and Goals of Care: [name] [name] has a past medical history significant for a prolonged out of hospital cardiac arrest with ventricular fibrillation in 2011.  He is status post ICD placement since then.  He has home oxygen.  Patient was seen and evaluated by cardiology in this hospitalization.  A palliative consultation has been requested for assistance with ongoing goals of care and disposition planning.  [name] [name] is resting in bed.  He is hard of hearing however he is awake alert and interactive.  He appears to be in mild distress because of mild increased respiratory effort.  He appears frail.  His daughter [name] [name] is present at the bedside.  Dr. Joesph Fillers with hospital medicine service as well as care management on call for the weekend is also present at the bedside.  Conversations were already underway for the patient to safely transition home with hospice services as soon as possible.  Brief life review was done in the context of this current hospitalization and imminent discharge along with addition of hospice services.  Patient worked as a Pharmacist, community for a very long time and even went back to work after his out of hospital arrest episode.  He has had functional decline lately.  He has made his wishes known very clearly for deactivation of ICD  and for him to be in his home/usual surroundings, to this and, addition of hospice as an extra layer of support is also being done.  We discussed about appropriate pain and non-pain symptom management briefly.  Agree with addition of morphine IV as needed for now, morphine solution been he is discharged home.  Agree that Foley catheter ought to remain in for comfort care related purposes.  Patient already has supplemental oxygen.  Additional durable medical equipment such as a hospital bed is being arranged for.  Hospice services to be contacted by care management.  Patient to be transported by ambulance.    HCPOA Daughter [name] [name] is the patient's designated healthcare power of attorney agent.  SUMMARY OF RECOMMENDATIONS   Agree with comfort measures only. Agree with home with hospice as per patient and family wishes Morphine solution, medical order for [name] representative to be contacted regarding deactivation of ICD placed. Thank you for the consult.   Code Status/Advance Care Planning:  DNR    Symptom Management:   As above  Palliative Prophylaxis:   Delirium Protocol  Additional Recommendations (Limitations,  Scope, Preferences):  Full Comfort Care  Psycho-social/Spiritual:   Desire for further Chaplaincy support:yes  Additional Recommendations: Education on Hospice  Prognosis:   < 4 weeks  Discharge Planning: Home with Hospice      Primary Diagnoses: Present on Admission: . Multiple myeloma ([place]) . URINARY RETENTION . Acute on chronic respiratory failure with hypoxia ([place]) . Acute on chronic diastolic HF (heart failure) ([place])   I have reviewed the medical record, interviewed the patient and family, and examined the patient. The following aspects are pertinent.  Past Medical History:  Diagnosis Date  . Anemia, unspecified   . CAD (coronary artery disease)   . Cataracts, bilateral   . CHF (congestive heart failure) ([place])   . Chronic  airway obstruction, not elsewhere classified    on o2 at night  . Diabetes mellitus   . Diverticulosis of colon (without mention of hemorrhage)   . Dyslipidemia   . Emphysema   . Hemorrhoids   . ICD (implantable cardiac defibrillator) in place    st jude  . Ischemic cardiomyopathy   . Macular degeneration   . MI, old 2010 or 2011  . Prostatic hypertrophy    hx of  . Retention of urine, unspecified   . Unspecified disorder resulting from impaired renal function    Social History   Socioeconomic History  . Marital status: Widowed    Spouse name: Not on file  . Number of children: Not on file  . Years of education: Not on file  . Highest education level: Not on file  Occupational History  . 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 Financial resource strain: Not on file  . Food insecurity    Worry: Not on file    Inability: Not on file  . Transportation needs    Medical: Not on file    Non-medical: Not on file  Tobacco Use  . Smoking status: Former Smoker    Types: Cigarettes    Quit date: 04/27/2009    Years since quitting: 9.4  . Smokeless tobacco: Never Used  Substance and Sexual Activity  . Alcohol use: Yes    Comment: occasional beer  . Drug use: No  . Sexual activity: Never  Lifestyle  . Physical activity    Days per week: Not on file    Minutes per session: Not on file  . Stress: Not on file  Relationships  . Social Herbalist on phone: Not on file    Gets together: Not on file    Attends religious service: Not on file    Active member of club or organization: Not on file    Attends meetings of clubs or organizations: Not on file    Relationship status: Not on file  Other Topics Concern  . Not on file  Social History Narrative   Lives in a house with stairs, which he needs to use, with his wife.  Wife is not able-bodied.  Does not use a cane or walker, but is unsteady on his feet.        [name] [name] Dutter:   [phone] cell, [phone], daughter, POA.     Boyd Kerbs:  [phone], nephew   Juluis Pitch:  [phone], daughter         Family History  Problem Relation Age of Onset  . Coronary artery disease Mother   . Stroke Mother   . Congestive Heart Failure Mother   . Lung cancer  Father        smoker  . Breast cancer Sister   . Atrial fibrillation Daughter   . Heart attack Brother    Scheduled Meds: . furosemide  40 mg Intravenous BID  . OLANZapine zydis  5 mg Oral Daily  . sodium chloride flush  3 mL Intravenous Q12H   Continuous Infusions: . sodium chloride    . chlorproMAZINE (THORAZINE) IV     PRN Meds:.sodium chloride, acetaminophen, acetaminophen, albuterol, ALPRAZolam, alum & mag hydroxide-simeth, antiseptic oral rinse, atropine, bisacodyl, chlorproMAZINE (THORAZINE) IV, diphenhydrAMINE, loperamide, LORazepam, magic mouthwash, morphine, morphine injection, nitroGLYCERIN, [DISCONTINUED] ondansetron **OR** ondansetron (ZOFRAN) IV, polyvinyl alcohol, sodium chloride flush Medications Prior to Admission:  Prior to Admission medications   Medication Sig Start Date End Date Taking? Authorizing Provider  albuterol (PROVENTIL) (2.5 MG/3ML) 0.083% nebulizer solution Take 3 mLs (2.5 mg total) every 2 (two) hours as needed by nebulization for wheezing or shortness of breath. 12/05/16  Yes Ghimire, Henreitta Leber, MD  ALPRAZolam Duanne Moron) 0.25 MG tablet Take 1 tablet (0.25 mg total) by mouth at bedtime as needed for sleep. 09/11/17  Yes Ladell Pier, MD  arformoterol (BROVANA) 15 MCG/2ML NEBU Take 15 mcg by nebulization 2 (two) times daily.   Yes [provider]  budesonide (PULMICORT) 0.5 MG/2ML nebulizer solution Take 2 mLs (0.5 mg total) by nebulization 2 (two) times daily. 08/31/12  Yes Parrett, Tammy S, NP  ferrous sulfate 325 (65 FE) MG tablet Take 1 tablet (325 mg total) by mouth 2 (two) times daily with a meal. 11/14/16  Yes Ollis, Brandi L, NP  guaiFENesin (MUCINEX) 600 MG  12 hr tablet Take 600 mg by mouth daily.    Yes [provider]  metoprolol tartrate (LOPRESSOR) 25 MG tablet Take 12.5 mg by mouth 2 (two) times daily. Reported on 06/06/2015   Yes [provider]  pantoprazole (PROTONIX) 40 MG tablet Take 1 tablet (40 mg total) by mouth daily. Patient taking differently: Take 40 mg by mouth 2 (two) times daily.  11/15/16  Yes Ollis, Brandi L, NP  rosuvastatin (CRESTOR) 5 MG tablet Take 1 tablet (5 mg total) by mouth daily. 10/12/17  Yes Josue Hector, MD  trimethoprim (TRIMPEX) 100 MG tablet Take 100 mg by mouth daily.  08/13/18  Yes [provider]  vitamin B-12 (CYANOCOBALAMIN) 1000 MCG tablet Take 1,000 mcg by mouth daily.   Yes [provider]  vitamin C (ASCORBIC ACID) 250 MG tablet Take 250 mg by mouth daily.   Yes [provider]  albuterol (PROAIR HFA) 108 (90 Base) MCG/ACT inhaler Inhale 2 puffs into the lungs every 4 (four) hours as needed for wheezing or shortness of breath. Patient not taking: Reported on 10/14/2018 12/26/16   Parrett, Fonnie Mu, NP  ciprofloxacin (CIPRO) 500 MG tablet Take 1 tablet (500 mg total) by mouth 2 (two) times daily. Patient not taking: Reported on 10/14/2018 08/13/18   Ladell Pier, MD  furosemide (LASIX) 20 MG tablet Take 1 tablet (20 mg total) by mouth daily as needed for edema. Patient not taking: Reported on 10/14/2018 11/25/17   Josue Hector, MD  loratadine (CLARITIN) 10 MG tablet Take 1 tablet (10 mg total) by mouth daily. Patient not taking: Reported on 10/14/2018 02/06/17   Barton Dubois, MD  methylPREDNISolone (MEDROL DOSEPAK) 4 MG TBPK tablet 6 x 1 day, 5 x 1 day, 4 x 1 day, 3 x 1 day, 2 x 1 day, 1 x 1 day Patient not taking:  Reported on 10/14/2018 09/28/18   Harle Stanford., PA-C  nitroGLYCERIN (NITROSTAT) 0.4 MG SL tablet Place 1 tablet (0.4 mg total) under the tongue every 5 (five) minutes as needed for chest pain. UP TO 3 DOSES THEN CALL EMS 11/02/16   Domenic Polite, MD   ondansetron (ZOFRAN) 8 MG tablet Take 0.5-1 tablets (4-8 mg total) by mouth every 8 (eight) hours as needed for nausea or vomiting. Take '8mg'$  30-60 min prior to melphalan. Patient not taking: Reported on 10/14/2018 08/10/18   Ladell Pier, MD  OXYGEN Inhale 2 L into the lungs at bedtime.     [provider]  predniSONE (DELTASONE) 20 MG tablet Take 2 tablets (40 mg total) by mouth daily with breakfast. Take 4 days on, 24 days off, repeat every 28 days Patient not taking: Reported on 10/14/2018 08/10/18   Ladell Pier, MD  predniSONE (DELTASONE) 20 MG tablet Take '40mg'$  daily x 3, '30mg'$  daily x 3, 20 mg daily x 3, 10 mg daily x 3 then stop Patient not taking: Reported on 10/14/2018 10/04/18   Marshell Garfinkel, MD  sodium chloride (OCEAN) 0.65 % SOLN nasal spray Place 2 sprays into both nostrils 2 (two) times daily. Patient not taking: Reported on 10/14/2018 11/14/16   Donita Brooks, NP   No Known Allergies Review of Systems Positive for shortness of breath Positive for to fatigue Hard of hearing  Physical Exam Frail elderly gentleman Mild distress, mild pursed lip breathing No edema Awake alert Abdomen is not distended Regular pattern of breathing, not using chest wall/accessory muscles for respiration.  Patient is on 3 L of oxygen via nasal cannula currently  Vital Signs: BP (!) 99/48   Pulse 84   Temp 97.7 F (36.5 C) (Oral)   Resp (!) 21   Ht 6' (1.829 m)   Wt 61.9 kg   SpO2 100%   BMI 18.51 kg/m  Pain Scale: 0-10   Pain Score: 0-No pain   SpO2: SpO2: 100 % O2 Device:SpO2: 100 % O2 Flow Rate: .O2 Flow Rate (L/min): 5 L/min  IO: Intake/output summary:   Intake/Output Summary (Last 24 hours) at 10/16/2018 1350 Last data filed at 10/16/2018 0900 Gross per 24 hour  Intake 1116.78 ml  Output 1725 ml  Net -608.22 ml    LBM: Last BM Date: 10/14/18 Baseline Weight: Weight: 65 kg Most recent weight: Weight: 61.9 kg     Palliative Assessment/Data:   PPS 40%   Time In:  12 Time Out:  1300 Time Total: 60  Greater than 50%  of this time was spent counseling and coordinating care related to the above assessment and plan.  Signed by: Loistine Chance, MD  [phone] Please contact Palliative Medicine Team phone at [phone] for questions and concerns.  For individual provider: See Shea Evans

## 2018-10-16 NOTE — Progress Notes (Signed)
Bodenhiemer N. P. Return call and made aware of patient History and today events. No orders given. Cont. To monitor patient and V.S.

## 2018-10-16 NOTE — Progress Notes (Signed)
Chaplain visited as a result of referral from the nurse.  The patient identified that their priest was arriving shortly and the chaplain would not be needed at this time.  Chaplain assesses there is no need to follow-up.  Brion Aliment Chaplain Resident For questions concerning this note please contact me by pager [phone]

## 2018-10-16 NOTE — Progress Notes (Addendum)
Text page Bodenhiemer N.P. about patient B.P.- 81/ 60 ,P- [address].R.. 90 . Patient voicing no complaints. Skin warm and dry.

## 2018-10-16 NOTE — Progress Notes (Addendum)
ANTICOAGULATION CONSULT NOTE - Follow Up Consult  Pharmacy Consult for Heparin Indication: chest pain/ACS  No Known Allergies  Patient Measurements: Height: 6' (182.9 cm) Weight: 136 lb 7.4 oz (61.9 kg) IBW/kg (Calculated) : 77.6 Heparin Dosing Weight: 64.4 kg  Vital Signs: Temp: 97.7 F (36.5 C) (09/19 0900) Temp Source: Oral (09/19 0900) BP: 99/48 (09/19 0900) Pulse Rate: 93 (09/19 0900)  Labs: Recent Labs    10/14/18 1305  10/14/18 1805 10/14/18 1929 10/14/18 2030 10/15/18 0318 10/15/18 0652 10/15/18 0810 10/15/18 1034 10/16/18 0253  HGB 9.9*  --   --   --   --   --   --  9.4*  --  8.5*  HCT 32.2*  --   --   --   --   --   --  29.2*  --  27.1*  PLT 82*  --   --   --   --   --   --  59*  --  61*  HEPARINUNFRC  --   --   --   --   --  0.56  --   --  0.59 0.41  CREATININE 1.72*  --   --   --   --   --   --   --  1.87* 1.88*  CKTOTAL  --   --   --   --  109  --   --   --   --   --   CKMB  --   --   --   --  19.7*  --   --   --   --   --   TROPONINIHS 236*   < > 3,613* 4,024*  --   --  2,858*  --   --   --    < > = values in this interval not displayed.    Estimated Creatinine Clearance: 22.4 mL/min (A) (by C-G formula based on SCr of 1.88 mg/dL (H)).    Assessment: 19 YOM presenting with CP/ACS. No anticoagulation noted PTA. Pharmacy consulted to dose heparin.  Heparin level therapeutic today at 0.41 on drip rate 800 units/hr. Hgb down from 9.4 to 8.5 today. Platelets improved slightly from 59 to 61. Per RN, was having infusion issues and almost lost IV access. There was dried blood around the IV site and patient bled slightly while adjusting the IV. Per IMTS, will plan to transfuse PRBC if Hgb < 8, and transfuse PLT if < 10K or if bleeding. Will continue current rate and monitor CBC/worsening bleeding closely.  Goal of Therapy:  Heparin level 0.3-0.7 units/ml Monitor platelets by anticoagulation protocol: Yes   Plan:  Continue heparin IV at 800 units/hr Daily  HL and CBC Closely monitor for bleeding  Richardine Service, PharmD PGY1 Pharmacy Resident Phone: [phone] 10/16/2018  11:15 AM  Please check [email or web address] for unit-specific pharmacy phone numbers.

## 2018-10-16 NOTE — TOC Transition Note (Signed)
Transition of Care [facility]) - CM/SW Discharge Note   Patient Details  Name: [name] Marinelarena, MD MRN: [id number] Date of Birth: May 11, 1925  [facility] CM/SW Contact:  Claudie Leach, RN Phone Number: [phone] 10/16/2018, 2:13 PM   Clinical Narrative:    Discussed dc plan with daughter, [name] and Dr. Joesph Fillers at bedside.  Dr. Rowe Pavy also present. Daughter, [name] is POA.  Patient to dc home with home hospice. Patient has decided he wants to go today and does not want to wait for hospital bed or any other arrangements.  Patient has home O2 through [employer], WC, RW, 3n1.  Patient will need a hospital bed but patient and family state they can manage with him in his own bed tonight.  Patient has 24 hour care arranged through [facility].  Family is also available to assist.   [name] presented with Medicare list of hospice agencies. [facility] chosen. Referral called to Erling Conte with [facility].  She will proceed with admissions process and will arrange for a bed ASAP.  ICD will be deactivated in the next hour or so.  [facility] will be called when patient ready to transport.  [place] forms at desk.     Final next level of care: Home w Hospice Care Barriers to Discharge: No Barriers Identified   Patient Goals and CMS Choice Patient states their goals for this hospitalization and ongoing recovery are:: to go home CMS Medicare.gov Compare Post Acute Care list provided to:: Patient Represenative (must comment)(daughter) Choice offered to / list presented to : Adult Children   Discharge Plan and Services                DME Arranged: Hospital bed DME Agency: [facility]     Representative spoke with at DME Agency: contacted by Harmon Pier from [place]: RN [facility] Agency: Hospice and [place] Date [place]: 10/16/18 Time [place]: [id number] Representative spoke with at [place]: Erling Conte

## 2018-10-16 NOTE — Discharge Summary (Signed)
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  Admission date:  10/14/2018  Admitting Physician  Jonnie Finner, DO  Discharge Date:  10/16/2018   Primary MD  [name], MD  Recommendations for primary care physician for things to follow:   Full comfort measures with hospice at home --okay to keep Foley catheter in place for comfort (to avoid voiding difficulties and wetness) -May use 3 to 8 L/min of oxygen as needed via nasal cannula -Okay to give nebulizer treatments for wheezing and shortness of breath -May use oral morphine concentrated solution as needed for comfort, anxiety and shortness of breath -Please use lorazepam/Ativan as needed for anxiety and restlessness -May use oral disintegrating tablets of ondansetron/Zofran for nausea and vomiting -May eat and drink as desired -Okay to use oral disintegrating tablets of Zyprexa for agitation/restlessness -The hospice Nurse and hospice team will help you with managing pain, discomfort and keep you comfortable Admission Diagnosis  Acute pulmonary edema ([place]) [J81.0] Acute respiratory failure with hypoxia ([place]) [J96.01] Acute on chronic diastolic HF (heart failure) ([place]) [I50.33]  Discharge Diagnosis  Acute pulmonary edema ([place]) [J81.0] Acute respiratory failure with hypoxia ([place]) [J96.01] Acute on chronic diastolic HF (heart failure) ([place]) [I50.33]   Principal Problem:   Acute on chronic diastolic HF (heart failure) ([place]) Active Problems:   URINARY RETENTION   Acute on chronic renal insufficiency   Acute on chronic respiratory failure with hypoxia (HCC)   Multiple myeloma (HCC)   Elevated troponin   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 ([place])     Past Medical History:  Diagnosis Date   Anemia, unspecified    CAD (coronary artery disease)    Cataracts, bilateral    CHF  (congestive heart failure) (HCC)    Chronic airway obstruction, not elsewhere classified    on o2 at night   Diabetes mellitus    Diverticulosis of colon (without mention of hemorrhage)    Dyslipidemia    Emphysema    Hemorrhoids    ICD (implantable cardiac defibrillator) in place    st jude   Ischemic cardiomyopathy    Macular degeneration    MI, old 2010 or 2011   Prostatic hypertrophy    hx of   Retention of urine, unspecified    Unspecified disorder resulting from impaired renal function     Past Surgical History:  Procedure Laterality Date   CARDIAC DEFIBRILLATOR PLACEMENT  2011   st jude   CATARACT EXTRACTION     PACEMAKER INSERTION  2011   pci     4/11   TONSILLECTOMY  85 yo    HPI  from the history and physical done on the day of admission:    - Chief Complaint: dyspnea  HPI: [name] Azure, 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 Presenting with worsening dyspnea. He was seen at [facility] earlier today. Upon arrival, he was found to be dyspneic and told to  come to the ED. Family reports that he's been non-compliant on most of his medication, but definitely lasix cause he doesn't like that it makes him pee so much. He says that his breathing has been slowly worsening over the last couple of weeks. His albuterol did not help. It got to the point where he couldn't get comfortable last night no matter what he did. He has felt short of breath with all activity.  ED Course: In the ED he was found to have a BNP of 1300+. He was given lasix and placed on Bipap. He was COVID negative. [facility] was called for admission.   Review of Systems: Reports dyspnea, edema. Denies CP, N, V, palpitations. Remainder of 10 point review of systems is otherwise negative for all not mentioned in HPI.    Hospital Course:   Brief Summary:- 85 year old with previous history of prolonged out of hospital cardiac arrest with ventricular  fibrillation in 2011. He was found to have CAD with stents in the RCA/circumflex and ramus intermedius. s/pSaint Jude AICD .  Admitted on 10/14/2018 with acute respiratory failure with hypoxia in the setting of CHF exacerbation with elevated troponins -Patient and family has now decided on full comfort measures with hospice involvement -IV heparin and other active treatment protocols have been discontinued at patient's request -[employer] representative was able to deactivate patient's AICD -Hospice consult and case management input appreciated -Patient is being discharged home with hospice care  A/p 1)HFpEF/ischemic Cardiomyopathy --- patient presenting with acute on chronic dCHF exacerbation with volume overload and hypoxia requiring BiPAP , repeat echo from 10/15/2018 with EF of 25 to 30% down from 55 to 60% in 11/2017  - repeat echo also demonstrates segmental wall motion abnormalities consistent with ischemic cardiomyopathy, patient also has mild to moderate aortic stenosis - elevated troponins noted, on admission chest x-ray with pleural effusions and interstitial edema --Echo findings suggest poor prognosis overall  2)NSTEMI--  ---troponin peaked at 4024, trended down -remains mostly chest pain-free -- after discussion of risk versus benefit of LHC with patient and cardiology service -No LHC planned at this time (if stented patient would not be able to tolerate DAPT due to chronic thrombocytopenia requiring transfusion), also patient has renal dysfunction and contrast may worsen his renal function- -cardiology service recommends medical management with IV heparin, aspirin 81 mg daily, metoprolol 12.5 mg twice daily and Crestor 5 mg daily LDL-69 with HDL of 45 -Repeat echo as noted above #1 with findings of ischemic cardiomyopathy conferring overall poor prognosis -Patient and family has now decided on full comfort measures with hospice involvement  3)AKI----acute kidney injury on CKD  stage - III, worsening renal function due to reduced renal perfusion in the setting of CHF exacerbation,   creatinine on admission=1.72  ,   baseline creatinine = 1.1 (09/20/18)    , creatinine is now= 1.88   -Patient and family has now decided on full comfort measures with hospice involvement  4)Pancytopenia/Multiple Myeloma----WBC is down to 3.4, hemoglobin is down to 8.5 from 9.9 on admission, platelets down to 61 from 82 on admission ----No evidence of active bleeding at this time  5) acute hypoxic respiratory failure--- underlying COPD, compounded by CHF exacerbation-continue BiPAP on and off,, continue supplemental oxygen  - 6)Dysphagia Concerns-  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- c/n to feed as tolerated  7)BPH with LUTs--- difficulties with urination, PTA patient was doing self-catheterization keep indwelling Foley for comfort  8)Social/Ethics---Patient and family has now decided on full  comfort measures with hospice involvement -IV heparin and other active treatment protocols have been discontinued at patient's request -[employer]. Jude representative was able to deactivate patient's AICD -Hospice consult and case management input appreciated -Patient is being discharged home with hospice care  Code Status : DNR/DNI   family Communication:    (patient is alert, awake and coherent) --Discussed with daughter [name] (HCPOA)-at bedside  Disposition Plan  :  Home with hospice  Consults  :  Cardiology/palliative/hospice  Discharge Condition: Grave Prognosis,  Follow UP--hospice at home  Diet and Activity recommendation:  As advised  Discharge Instructions    Discharge Instructions    Bed rest   Complete by: As directed    Call MD for:  severe uncontrolled pain   Complete by: As directed    Diet general   Complete by: As directed    Discharge instructions   Complete by: As directed    Full comfort measures with hospice at home --okay to keep Foley  catheter in place for comfort (to avoid voiding difficulties and wetness) -May use 3 to 8 L/min of oxygen as needed via nasal cannula -Okay to give nebulizer treatments for wheezing and shortness of breath -May use oral morphine concentrated solution as needed for comfort, anxiety and shortness of breath -Please use lorazepam/Ativan as needed for anxiety and restlessness -May use oral disintegrating tablets of ondansetron/Zofran for nausea and vomiting -May eat and drink as desired -Okay to use oral disintegrating tablets of Zyprexa for agitation/restlessness -The hospice Nurse and hospice team will help you with managing pain, discomfort and keep you comfortable      Discharge Medications    Allergies as of 10/16/2018   No Known Allergies     Medication List    STOP taking these medications   ALPRAZolam 0.25 MG tablet Commonly known as: XANAX   arformoterol 15 MCG/2ML Nebu Commonly known as: BROVANA   budesonide 0.5 MG/2ML nebulizer solution Commonly known as: PULMICORT   ciprofloxacin 500 MG tablet Commonly known as: Cipro   ferrous sulfate 325 (65 FE) MG tablet   furosemide 20 MG tablet Commonly known as: LASIX   loratadine 10 MG tablet Commonly known as: CLARITIN   methylPREDNISolone 4 MG Tbpk tablet Commonly known as: MEDROL DOSEPAK   metoprolol tartrate 25 MG tablet Commonly known as: LOPRESSOR   ondansetron 8 MG tablet Commonly known as: ZOFRAN   pantoprazole 40 MG tablet Commonly known as: PROTONIX   predniSONE 20 MG tablet Commonly known as: DELTASONE   rosuvastatin 5 MG tablet Commonly known as: CRESTOR   trimethoprim 100 MG tablet Commonly known as: TRIMPEX   vitamin B-12 1000 MCG tablet Commonly known as: CYANOCOBALAMIN   vitamin C 250 MG tablet Commonly known as: ASCORBIC ACID     TAKE these medications   acetaminophen 650 MG suppository Commonly known as: TYLENOL Place 1 suppository (650 mg total) rectally every 6 (six) hours as  needed for mild pain (or Fever >/= 101).   albuterol (2.5 MG/3ML) 0.083% nebulizer solution Commonly known as: PROVENTIL Take 3 mLs (2.5 mg total) every 2 (two) hours as needed by nebulization for wheezing or shortness of breath.   albuterol 108 (90 Base) MCG/ACT inhaler Commonly known as: ProAir HFA Inhale 2 puffs into the lungs every 4 (four) hours as needed for wheezing or shortness of breath.   antiseptic oral rinse Liqd Apply 15 mLs topically as needed for dry mouth.   atropine 1 % ophthalmic solution Place 4 drops under the  tongue every 4 (four) hours as needed (excessive secretions).   LORazepam 2 MG/ML concentrated solution Commonly known as: ATIVAN Place 0.5 mLs (1 mg total) under the tongue every 4 (four) hours as needed for anxiety.   morphine CONCENTRATE 10 MG/0.5ML Soln concentrated solution Take 0.25 mLs (5 mg total) by mouth every 2 (two) hours as needed for moderate pain (or dyspnea).   Mucinex 600 MG 12 hr tablet Generic drug: guaiFENesin Take 600 mg by mouth daily.   nitroGLYCERIN 0.4 MG SL tablet Commonly known as: NITROSTAT Place 1 tablet (0.4 mg total) under the tongue every 5 (five) minutes as needed for chest pain. UP TO 3 DOSES THEN CALL EMS   OLANZapine zydis 5 MG disintegrating tablet Commonly known as: ZYPREXA Take 1 tablet (5 mg total) by mouth daily.   ondansetron 4 MG disintegrating tablet Commonly known as: Zofran ODT Take 1 tablet (4 mg total) by mouth every 4 (four) hours as needed for nausea or vomiting.   OXYGEN Inhale 2 L into the lungs at bedtime.   polyvinyl alcohol 1.4 % ophthalmic solution Commonly known as: LIQUIFILM TEARS Place 1 drop into both eyes 4 (four) times daily as needed for dry eyes.   sodium chloride 0.65 % Soln nasal spray Commonly known as: OCEAN Place 2 sprays into both nostrils 2 (two) times daily.      Major procedures and Radiology Reports - PLEASE review detailed and final reports for all details, in brief  -   Ct Angio Chest W/cm &/or Wo Cm  Result Date: 09/27/2018 CLINICAL DATA:  Shortness of breath and chest pain EXAM: CT ANGIOGRAPHY CHEST WITH CONTRAST TECHNIQUE: Multidetector CT imaging of the chest was performed using the standard protocol during bolus administration of intravenous contrast. Multiplanar CT image reconstructions and MIPs were obtained to evaluate the vascular anatomy. CONTRAST:  174m OMNIPAQUE IOHEXOL 350 MG/ML SOLN COMPARISON:  09/15/2018 plain film FINDINGS: Cardiovascular: Atherosclerotic calcifications of the thoracic aorta and its branches are seen. No aneurysmal dilatation is noted. Opacification is suboptimal to evaluate for dissection. Coronary calcifications are seen. The pulmonary artery shows a normal branching pattern. No findings to suggest pulmonary emboli are identified. Mediastinum/Nodes: Thoracic inlet is within normal limits. No sizable hilar or mediastinal adenopathy is noted. The esophagus is within normal limits. Lungs/Pleura: Small bilateral pleural effusions are noted. Emphysematous changes are seen. Mild atelectatic changes are noted in the lingula. Mild scarring is noted in the right middle lobe inferiorly. No focal parenchymal nodule is seen. Mild dependent atelectatic changes are seen. Upper Abdomen: Visualized upper abdomen shows cystic changes within the kidneys but incompletely evaluated. Musculoskeletal: Degenerative changes of the thoracic spine are noted. No acute rib abnormality is noted. No compression deformities are seen. Review of the MIP images confirms the above findings. IMPRESSION: No evidence of pulmonary emboli. Small bilateral pleural effusions. Emphysematous changes with mild dependent basilar atelectasis. Density is noted in the lingula posteriorly likely related atelectasis. Aortic Atherosclerosis (ICD10-I70.0) and Emphysema (ICD10-J43.9). Electronically Signed   By: MInez CatalinaM.D.   On: 09/27/2018 15:58   Dg Chest Portable 1 View  Result  Date: 10/14/2018 CLINICAL DATA:  Increasing shortness of breath. EXAM: PORTABLE CHEST 1 VIEW COMPARISON:  September 15, 2018 FINDINGS: Stable position of cardiac pacemaker. Cardiomediastinal silhouette is normal. Mediastinal contours appear intact. Tortuosity and calcific atherosclerotic disease of the aorta. Bilateral layering pleural effusions with mild interstitial pulmonary edema. Osseous structures are without acute abnormality. Soft tissues are grossly normal. IMPRESSION: Bilateral layering pleural  effusions with mild interstitial pulmonary edema. Electronically Signed   By: Fidela Salisbury M.D.   On: 10/14/2018 13:46   Micro Results   Recent Results (from the past 240 hour(s))  Urine Culture     Status: Abnormal   Collection Time: 10/07/18 11:00 AM   Specimen: Urine, Clean Catch  Result Value Ref Range Status   Specimen Description   Final    URINE, CLEAN CATCH Performed at [facility], [address], [place], [place] [address]    Special Requests   Final    NONE Performed at [facility], [place] [address], [place], [place] [address]    Culture (A)  Final    <10,000 COLONIES/mL INSIGNIFICANT GROWTH Performed at [facility] [address], [place], [place] [address]    Report Status 10/08/2018 FINAL  Final  SARS Coronavirus 2 [facility] order, Performed in [facility] hospital lab) Nasopharyngeal Nasopharyngeal Swab     Status: None   Collection Time: 10/14/18  1:02 PM   Specimen: Nasopharyngeal Swab  Result Value Ref Range Status   SARS Coronavirus 2 NEGATIVE NEGATIVE Final    Comment: (NOTE) If result is NEGATIVE SARS-CoV-2 target nucleic acids are NOT DETECTED. The SARS-CoV-2 RNA is generally detectable in upper and lower  respiratory specimens during the acute phase of infection. The lowest  concentration of SARS-CoV-2 viral copies this assay can detect is 250  copies / mL. A negative result does not preclude  SARS-CoV-2 infection  and should not be used as the sole basis for treatment or other  patient management decisions.  A negative result may occur with  improper specimen collection / handling, submission of specimen other  than nasopharyngeal swab, presence of viral mutation(s) within the  areas targeted by this assay, and inadequate number of viral copies  (<250 copies / mL). A negative result must be combined with clinical  observations, patient history, and epidemiological information. If result is POSITIVE SARS-CoV-2 target nucleic acids are DETECTED. The SARS-CoV-2 RNA is generally detectable in upper and lower  respiratory specimens dur ing the acute phase of infection.  Positive  results are indicative of active infection with SARS-CoV-2.  Clinical  correlation with patient history and other diagnostic information is  necessary to determine patient infection status.  Positive results do  not rule out bacterial infection or co-infection with other viruses. If result is PRESUMPTIVE POSTIVE SARS-CoV-2 nucleic acids MAY BE PRESENT.   A presumptive positive result was obtained on the submitted specimen  and confirmed on repeat testing.  While 2019 novel coronavirus  (SARS-CoV-2) nucleic acids may be present in the submitted sample  additional confirmatory testing may be necessary for epidemiological  and / or clinical management purposes  to differentiate between  SARS-CoV-2 and other Sarbecovirus currently known to infect humans.  If clinically indicated additional testing with an alternate test  methodology [phone]) is advised. The SARS-CoV-2 RNA is generally  detectable in upper and lower respiratory sp ecimens during the acute  phase of infection. The expected result is Negative. Fact Sheet for Patients:  StrictlyIdeas.no Fact Sheet for Healthcare Providers: BankingDealers.co.za This test is not yet approved or cleared by the  [place] FDA and has been authorized for detection and/or diagnosis of SARS-CoV-2 by FDA under an Emergency Use Authorization (EUA).  This EUA will remain in effect (meaning this test can be used) for the duration of the COVID-19 declaration under Section 564(b)(1) of the Act, 21 U.S.C.  section 360bbb-3(b)(1), unless the authorization is terminated or revoked sooner. Performed at [facility], [place] [address], [place], [place] [address]     Today   Subjective   Shaman Muscarella today has shortness of breath and occasional chest discomfort        -Daughter [name] who is [facility] POA is at bedside -Patient and family has now decided on full comfort measures with hospice involvement   Objective   Blood pressure (!) 99/48, pulse 84, temperature 97.7 F (36.5 C), temperature source Oral, resp. rate (!) 21, height 6' (1.829 m), weight 61.9 kg, SpO2 100 %.   Intake/Output Summary (Last 24 hours) at 10/16/2018 1635 Last data filed at 10/16/2018 0900 Gross per 24 hour  Intake 674 ml  Output 1725 ml  Net -1051 ml   Exam Gen:-Respiratory distress/increased work of breathing  HEENT:- [place].AT, No sclera icterus Ears- very HOH Nose-  4L/min  Neck-Supple Neck,  Lungs-diminished in bases with faint bibasilar rales, no wheezing CV- S1, S2 normal, irregular,lt subclavian area with AICD (turned off by [facility] Jude rep) Abd-  +ve B.Sounds, Abd Soft, No tenderness,    Extremity/Skin:- trace  edema, pedal pulses present  Psych-affect is flat to depressed, oriented x3 Neuro-generalized weakness, no new focal deficits, no tremors GU--Foley catheter in situ with clear urine   Data Review   CBC w Diff:  Lab Results  Component Value Date   WBC 3.4 (L) 10/16/2018   HGB 8.5 (L) 10/16/2018   HGB 9.3 (L) 10/14/2018   HGB 7.4 (L) 01/12/2017   HCT 27.1 (L) 10/16/2018   HCT 23.8 (L) 01/12/2017   PLT 61 (L) 10/16/2018   PLT 71 (L) 10/14/2018   PLT 89 (L) 01/12/2017    LYMPHOPCT 6 10/14/2018   LYMPHOPCT 15.6 01/12/2017   MONOPCT 5 10/14/2018   MONOPCT 6.9 01/12/2017   EOSPCT 0 10/14/2018   EOSPCT 2.1 01/12/2017   BASOPCT 0 10/14/2018   BASOPCT 1.6 01/12/2017    CMP:  Lab Results  Component Value Date   NA 138 10/16/2018   NA 146 (H) 12/02/2017   NA 140 01/12/2017   K 3.7 10/16/2018   K 4.4 01/12/2017   CL 97 (L) 10/16/2018   CO2 25 10/16/2018   CO2 27 01/12/2017   BUN 71 (H) 10/16/2018   BUN 30 12/02/2017   BUN 23.7 01/12/2017   CREATININE 1.88 (H) 10/16/2018   CREATININE 1.16 09/20/2018   CREATININE 1.0 01/12/2017   PROT 6.4 (L) 10/14/2018   PROT 5.7 (L) 01/12/2017   PROT 6.1 (L) 01/12/2017   ALBUMIN 3.6 10/14/2018   ALBUMIN 2.9 (L) 01/12/2017   BILITOT 1.0 10/14/2018   BILITOT 0.5 09/20/2018   BILITOT 0.47 01/12/2017   ALKPHOS 48 10/14/2018   ALKPHOS 49 01/12/2017   AST 27 10/14/2018   AST 12 (L) 09/20/2018   AST 11 01/12/2017   ALT 30 10/14/2018   ALT 26 09/20/2018   ALT 12 01/12/2017  . Total Discharge time is about 33 minutes  Roxan Hockey M.D on 10/16/2018 at 4:35 PM  Go to [email or web address] -  for contact info  [facility] - Office  [phone]

## 2018-10-16 NOTE — Progress Notes (Signed)
[facility]   Received request for hospice services at home after discharge. Chart reviewed and eligibility confirmed by hospice physician.   Spoke with daughter [name] by phone to explain services and confirm interest. [name] requesting hospital bed to be delivered to home after patient is discharged. Per [name] patient wanting to get home as soon as possible. He already has oxygen at home through [facility]. Hospital bed and over bed table ordered to be delivered to home this evening or tomorrow. [name] is contact to work with [employer].   Please send signed and completed DNR home with patient.   Please arrange for scripts for any new medications.   Referral Center Specialist will contact [name] to schedule first visit in the home.   Thank you,  Erling Conte, LCSW [phone]

## 2018-10-16 NOTE — Progress Notes (Signed)
CBG 62 Patient voiced no complaints. Ice cream and Spirt given at his request . Will repeat shortly.

## 2018-10-16 NOTE — Discharge Instructions (Signed)
Full comfort measures with hospice at home --okay to keep Foley catheter in place for comfort (to avoid voiding difficulties and wetness) -May use 3 to 8 L/min of oxygen as needed via nasal cannula -Okay to give nebulizer treatments for wheezing and shortness of breath -May use oral morphine concentrated solution as needed for comfort, anxiety and shortness of breath -Please use lorazepam/Ativan as needed for anxiety and restlessness -May use oral disintegrating tablets of ondansetron/Zofran for nausea and vomiting -May eat and drink as desired -Okay to use oral disintegrating tablets of Zyprexa for agitation/restlessness -The hospice Nurse and hospice team will help you with managing pain, discomfort and keep you comfortable

## 2018-10-17 DIAGNOSIS — R339 Retention of urine, unspecified: Secondary | ICD-10-CM | POA: Diagnosis not present

## 2018-10-17 DIAGNOSIS — D61818 Other pancytopenia: Secondary | ICD-10-CM | POA: Diagnosis not present

## 2018-10-17 DIAGNOSIS — Z8674 Personal history of sudden cardiac arrest: Secondary | ICD-10-CM | POA: Diagnosis not present

## 2018-10-17 DIAGNOSIS — E785 Hyperlipidemia, unspecified: Secondary | ICD-10-CM | POA: Diagnosis not present

## 2018-10-17 DIAGNOSIS — Z9981 Dependence on supplemental oxygen: Secondary | ICD-10-CM | POA: Diagnosis not present

## 2018-10-17 DIAGNOSIS — Z7401 Bed confinement status: Secondary | ICD-10-CM | POA: Diagnosis not present

## 2018-10-17 DIAGNOSIS — I255 Ischemic cardiomyopathy: Secondary | ICD-10-CM | POA: Diagnosis not present

## 2018-10-17 DIAGNOSIS — I5020 Unspecified systolic (congestive) heart failure: Secondary | ICD-10-CM | POA: Diagnosis not present

## 2018-10-17 DIAGNOSIS — Z741 Need for assistance with personal care: Secondary | ICD-10-CM | POA: Diagnosis not present

## 2018-10-17 DIAGNOSIS — Z8679 Personal history of other diseases of the circulatory system: Secondary | ICD-10-CM | POA: Diagnosis not present

## 2018-10-17 DIAGNOSIS — N401 Enlarged prostate with lower urinary tract symptoms: Secondary | ICD-10-CM | POA: Diagnosis not present

## 2018-10-17 DIAGNOSIS — J449 Chronic obstructive pulmonary disease, unspecified: Secondary | ICD-10-CM | POA: Diagnosis not present

## 2018-10-17 DIAGNOSIS — Z681 Body mass index (BMI) 19 or less, adult: Secondary | ICD-10-CM | POA: Diagnosis not present

## 2018-10-17 DIAGNOSIS — Z95810 Presence of automatic (implantable) cardiac defibrillator: Secondary | ICD-10-CM | POA: Diagnosis not present

## 2018-10-17 DIAGNOSIS — S51011D Laceration without foreign body of right elbow, subsequent encounter: Secondary | ICD-10-CM | POA: Diagnosis not present

## 2018-10-17 DIAGNOSIS — N183 Chronic kidney disease, stage 3 (moderate): Secondary | ICD-10-CM | POA: Diagnosis not present

## 2018-10-17 DIAGNOSIS — I2510 Atherosclerotic heart disease of native coronary artery without angina pectoris: Secondary | ICD-10-CM | POA: Diagnosis not present

## 2018-10-17 DIAGNOSIS — C9000 Multiple myeloma not having achieved remission: Secondary | ICD-10-CM | POA: Diagnosis not present

## 2018-10-18 ENCOUNTER — Telehealth: Payer: Self-pay | Admitting: *Deleted

## 2018-10-18 DIAGNOSIS — I5020 Unspecified systolic (congestive) heart failure: Secondary | ICD-10-CM | POA: Diagnosis not present

## 2018-10-18 DIAGNOSIS — I2510 Atherosclerotic heart disease of native coronary artery without angina pectoris: Secondary | ICD-10-CM | POA: Diagnosis not present

## 2018-10-18 DIAGNOSIS — I255 Ischemic cardiomyopathy: Secondary | ICD-10-CM | POA: Diagnosis not present

## 2018-10-18 DIAGNOSIS — R339 Retention of urine, unspecified: Secondary | ICD-10-CM | POA: Diagnosis not present

## 2018-10-18 DIAGNOSIS — J449 Chronic obstructive pulmonary disease, unspecified: Secondary | ICD-10-CM | POA: Diagnosis not present

## 2018-10-18 DIAGNOSIS — N183 Chronic kidney disease, stage 3 (moderate): Secondary | ICD-10-CM | POA: Diagnosis not present

## 2018-10-18 LAB — TYPE AND SCREEN
ABO/RH(D): A POS
Antibody Screen: NEGATIVE
Unit division: 0

## 2018-10-18 LAB — BPAM RBC
Blood Product Expiration Date: 202010102359
Unit Type and Rh: 6200

## 2018-10-18 NOTE — Telephone Encounter (Signed)
Called to report he was discharged from hospital with Hospice care. Daughter requested his appointments to be canceled. They are expecting a rapid decline of 2 weeks or less.

## 2018-10-19 DIAGNOSIS — R339 Retention of urine, unspecified: Secondary | ICD-10-CM | POA: Diagnosis not present

## 2018-10-19 DIAGNOSIS — I5020 Unspecified systolic (congestive) heart failure: Secondary | ICD-10-CM | POA: Diagnosis not present

## 2018-10-19 DIAGNOSIS — I2510 Atherosclerotic heart disease of native coronary artery without angina pectoris: Secondary | ICD-10-CM | POA: Diagnosis not present

## 2018-10-19 DIAGNOSIS — N183 Chronic kidney disease, stage 3 (moderate): Secondary | ICD-10-CM | POA: Diagnosis not present

## 2018-10-19 DIAGNOSIS — I255 Ischemic cardiomyopathy: Secondary | ICD-10-CM | POA: Diagnosis not present

## 2018-10-19 DIAGNOSIS — J449 Chronic obstructive pulmonary disease, unspecified: Secondary | ICD-10-CM | POA: Diagnosis not present

## 2018-10-20 DIAGNOSIS — N183 Chronic kidney disease, stage 3 (moderate): Secondary | ICD-10-CM | POA: Diagnosis not present

## 2018-10-20 DIAGNOSIS — I2510 Atherosclerotic heart disease of native coronary artery without angina pectoris: Secondary | ICD-10-CM | POA: Diagnosis not present

## 2018-10-20 DIAGNOSIS — I5020 Unspecified systolic (congestive) heart failure: Secondary | ICD-10-CM | POA: Diagnosis not present

## 2018-10-20 DIAGNOSIS — R339 Retention of urine, unspecified: Secondary | ICD-10-CM | POA: Diagnosis not present

## 2018-10-20 DIAGNOSIS — I255 Ischemic cardiomyopathy: Secondary | ICD-10-CM | POA: Diagnosis not present

## 2018-10-20 DIAGNOSIS — J449 Chronic obstructive pulmonary disease, unspecified: Secondary | ICD-10-CM | POA: Diagnosis not present

## 2018-10-21 ENCOUNTER — Telehealth: Payer: Self-pay | Admitting: *Deleted

## 2018-10-21 ENCOUNTER — Telehealth: Payer: Self-pay | Admitting: Cardiovascular Disease

## 2018-10-21 ENCOUNTER — Telehealth: Payer: Self-pay | Admitting: Internal Medicine

## 2018-10-21 DIAGNOSIS — I5020 Unspecified systolic (congestive) heart failure: Secondary | ICD-10-CM | POA: Diagnosis not present

## 2018-10-21 DIAGNOSIS — I255 Ischemic cardiomyopathy: Secondary | ICD-10-CM | POA: Diagnosis not present

## 2018-10-21 DIAGNOSIS — R339 Retention of urine, unspecified: Secondary | ICD-10-CM | POA: Diagnosis not present

## 2018-10-21 DIAGNOSIS — N183 Chronic kidney disease, stage 3 (moderate): Secondary | ICD-10-CM | POA: Diagnosis not present

## 2018-10-21 DIAGNOSIS — I2510 Atherosclerotic heart disease of native coronary artery without angina pectoris: Secondary | ICD-10-CM | POA: Diagnosis not present

## 2018-10-21 DIAGNOSIS — J449 Chronic obstructive pulmonary disease, unspecified: Secondary | ICD-10-CM | POA: Diagnosis not present

## 2018-10-28 NOTE — Telephone Encounter (Signed)
So sorry to hear this sad news, will route to TP to make her aware.

## 2018-10-28 NOTE — Telephone Encounter (Signed)
Will forward to Dr.Nishan. 

## 2018-10-28 NOTE — Telephone Encounter (Signed)
Call from hospice RN that patient died in home today at [phone]. Daughter requested Dr. [name] be called.

## 2018-10-28 NOTE — Telephone Encounter (Signed)
New Message:   Nurse called, family wanted [name] to know that the pt passed away this morning.

## 2018-10-28 DEATH — deceased

## 2018-10-29 ENCOUNTER — Ambulatory Visit: Payer: Medicare Other | Admitting: Oncology

## 2018-10-29 ENCOUNTER — Other Ambulatory Visit: Payer: Medicare Other

## 2018-10-29 ENCOUNTER — Ambulatory Visit: Payer: Medicare Other

## 2018-11-12 ENCOUNTER — Ambulatory Visit: Payer: Medicare Other

## 2019-10-28 DEATH — deceased
